# Patient Record
Sex: Male | Born: 1970
Health system: Southern US, Community
[De-identification: ages and names within clinical notes are randomized; demographics above are authoritative.]

## PROBLEM LIST (undated history)

## (undated) DIAGNOSIS — R569 Unspecified convulsions: Secondary | ICD-10-CM

## (undated) DIAGNOSIS — J45909 Unspecified asthma, uncomplicated: Secondary | ICD-10-CM

## (undated) DIAGNOSIS — I2510 Atherosclerotic heart disease of native coronary artery without angina pectoris: Secondary | ICD-10-CM

## (undated) DIAGNOSIS — I619 Nontraumatic intracerebral hemorrhage, unspecified: Secondary | ICD-10-CM

## (undated) DIAGNOSIS — K219 Gastro-esophageal reflux disease without esophagitis: Secondary | ICD-10-CM

## (undated) DIAGNOSIS — I509 Heart failure, unspecified: Secondary | ICD-10-CM

## (undated) DIAGNOSIS — Z9981 Dependence on supplemental oxygen: Secondary | ICD-10-CM

## (undated) DIAGNOSIS — I639 Cerebral infarction, unspecified: Secondary | ICD-10-CM

## (undated) DIAGNOSIS — I10 Essential (primary) hypertension: Secondary | ICD-10-CM

## (undated) HISTORY — PX: LEG SURGERY: SHX1003

---

## 1998-03-13 ENCOUNTER — Emergency Department (HOSPITAL_COMMUNITY): Admission: EM | Admit: 1998-03-13 | Discharge: 1998-03-13 | Payer: Self-pay | Admitting: Emergency Medicine

## 1998-06-12 ENCOUNTER — Emergency Department (HOSPITAL_COMMUNITY): Admission: EM | Admit: 1998-06-12 | Discharge: 1998-06-12 | Payer: Self-pay | Admitting: Emergency Medicine

## 1998-12-29 ENCOUNTER — Emergency Department (HOSPITAL_COMMUNITY): Admission: EM | Admit: 1998-12-29 | Discharge: 1998-12-29 | Payer: Self-pay | Admitting: *Deleted

## 1999-01-12 ENCOUNTER — Encounter: Payer: Self-pay | Admitting: Emergency Medicine

## 1999-01-12 ENCOUNTER — Emergency Department (HOSPITAL_COMMUNITY): Admission: EM | Admit: 1999-01-12 | Discharge: 1999-01-12 | Payer: Self-pay | Admitting: Emergency Medicine

## 1999-02-05 ENCOUNTER — Emergency Department (HOSPITAL_COMMUNITY): Admission: EM | Admit: 1999-02-05 | Discharge: 1999-02-05 | Payer: Self-pay | Admitting: Emergency Medicine

## 1999-04-03 ENCOUNTER — Inpatient Hospital Stay (HOSPITAL_COMMUNITY): Admission: EM | Admit: 1999-04-03 | Discharge: 1999-04-06 | Payer: Self-pay | Admitting: Emergency Medicine

## 1999-04-03 ENCOUNTER — Encounter: Payer: Self-pay | Admitting: Internal Medicine

## 1999-04-06 ENCOUNTER — Encounter: Payer: Self-pay | Admitting: Internal Medicine

## 1999-11-26 ENCOUNTER — Emergency Department (HOSPITAL_COMMUNITY): Admission: EM | Admit: 1999-11-26 | Discharge: 1999-11-26 | Payer: Self-pay | Admitting: Emergency Medicine

## 1999-11-28 ENCOUNTER — Emergency Department (HOSPITAL_COMMUNITY): Admission: EM | Admit: 1999-11-28 | Discharge: 1999-11-28 | Payer: Self-pay | Admitting: Emergency Medicine

## 1999-11-30 ENCOUNTER — Emergency Department (HOSPITAL_COMMUNITY): Admission: EM | Admit: 1999-11-30 | Discharge: 1999-11-30 | Payer: Self-pay | Admitting: Emergency Medicine

## 1999-11-30 ENCOUNTER — Encounter: Payer: Self-pay | Admitting: Emergency Medicine

## 1999-12-06 ENCOUNTER — Emergency Department (HOSPITAL_COMMUNITY): Admission: EM | Admit: 1999-12-06 | Discharge: 1999-12-06 | Payer: Self-pay | Admitting: Emergency Medicine

## 2000-04-21 ENCOUNTER — Emergency Department (HOSPITAL_COMMUNITY): Admission: EM | Admit: 2000-04-21 | Discharge: 2000-04-21 | Payer: Self-pay | Admitting: *Deleted

## 2000-06-12 ENCOUNTER — Emergency Department (HOSPITAL_COMMUNITY): Admission: EM | Admit: 2000-06-12 | Discharge: 2000-06-12 | Payer: Self-pay | Admitting: Emergency Medicine

## 2000-09-12 ENCOUNTER — Encounter: Payer: Self-pay | Admitting: Emergency Medicine

## 2000-09-12 ENCOUNTER — Emergency Department (HOSPITAL_COMMUNITY): Admission: EM | Admit: 2000-09-12 | Discharge: 2000-09-12 | Payer: Self-pay | Admitting: Emergency Medicine

## 2000-09-17 ENCOUNTER — Emergency Department (HOSPITAL_COMMUNITY): Admission: EM | Admit: 2000-09-17 | Discharge: 2000-09-17 | Payer: Self-pay | Admitting: Emergency Medicine

## 2000-11-13 ENCOUNTER — Encounter: Payer: Self-pay | Admitting: Emergency Medicine

## 2000-11-13 ENCOUNTER — Emergency Department (HOSPITAL_COMMUNITY): Admission: EM | Admit: 2000-11-13 | Discharge: 2000-11-13 | Payer: Self-pay | Admitting: Emergency Medicine

## 2000-12-15 ENCOUNTER — Emergency Department (HOSPITAL_COMMUNITY): Admission: EM | Admit: 2000-12-15 | Discharge: 2000-12-15 | Payer: Self-pay | Admitting: Emergency Medicine

## 2001-01-13 ENCOUNTER — Emergency Department (HOSPITAL_COMMUNITY): Admission: EM | Admit: 2001-01-13 | Discharge: 2001-01-13 | Payer: Self-pay | Admitting: Emergency Medicine

## 2001-01-22 ENCOUNTER — Emergency Department (HOSPITAL_COMMUNITY): Admission: EM | Admit: 2001-01-22 | Discharge: 2001-01-22 | Payer: Self-pay | Admitting: Emergency Medicine

## 2001-01-26 ENCOUNTER — Emergency Department (HOSPITAL_COMMUNITY): Admission: EM | Admit: 2001-01-26 | Discharge: 2001-01-26 | Payer: Self-pay | Admitting: Emergency Medicine

## 2001-01-27 ENCOUNTER — Emergency Department (HOSPITAL_COMMUNITY): Admission: EM | Admit: 2001-01-27 | Discharge: 2001-01-27 | Payer: Self-pay | Admitting: Emergency Medicine

## 2001-02-01 ENCOUNTER — Emergency Department (HOSPITAL_COMMUNITY): Admission: EM | Admit: 2001-02-01 | Discharge: 2001-02-01 | Payer: Self-pay | Admitting: Emergency Medicine

## 2001-04-09 ENCOUNTER — Emergency Department (HOSPITAL_COMMUNITY): Admission: EM | Admit: 2001-04-09 | Discharge: 2001-04-09 | Payer: Self-pay | Admitting: Emergency Medicine

## 2001-06-16 ENCOUNTER — Emergency Department (HOSPITAL_COMMUNITY): Admission: EM | Admit: 2001-06-16 | Discharge: 2001-06-16 | Payer: Self-pay | Admitting: Emergency Medicine

## 2001-08-11 ENCOUNTER — Emergency Department (HOSPITAL_COMMUNITY): Admission: EM | Admit: 2001-08-11 | Discharge: 2001-08-11 | Payer: Self-pay

## 2001-12-19 ENCOUNTER — Encounter: Payer: Self-pay | Admitting: Emergency Medicine

## 2001-12-19 ENCOUNTER — Emergency Department (HOSPITAL_COMMUNITY): Admission: EM | Admit: 2001-12-19 | Discharge: 2001-12-19 | Payer: Self-pay | Admitting: Emergency Medicine

## 2001-12-26 ENCOUNTER — Emergency Department (HOSPITAL_COMMUNITY): Admission: EM | Admit: 2001-12-26 | Discharge: 2001-12-26 | Payer: Self-pay | Admitting: Emergency Medicine

## 2001-12-26 ENCOUNTER — Encounter: Payer: Self-pay | Admitting: Emergency Medicine

## 2001-12-28 ENCOUNTER — Encounter: Payer: Self-pay | Admitting: *Deleted

## 2001-12-28 ENCOUNTER — Emergency Department (HOSPITAL_COMMUNITY): Admission: EM | Admit: 2001-12-28 | Discharge: 2001-12-28 | Payer: Self-pay | Admitting: *Deleted

## 2002-01-09 ENCOUNTER — Emergency Department (HOSPITAL_COMMUNITY): Admission: EM | Admit: 2002-01-09 | Discharge: 2002-01-09 | Payer: Self-pay | Admitting: *Deleted

## 2002-03-27 ENCOUNTER — Encounter: Payer: Self-pay | Admitting: Emergency Medicine

## 2002-03-27 ENCOUNTER — Inpatient Hospital Stay (HOSPITAL_COMMUNITY): Admission: EM | Admit: 2002-03-27 | Discharge: 2002-03-28 | Payer: Self-pay | Admitting: *Deleted

## 2002-03-28 ENCOUNTER — Encounter: Payer: Self-pay | Admitting: Internal Medicine

## 2002-06-16 ENCOUNTER — Inpatient Hospital Stay (HOSPITAL_COMMUNITY): Admission: EM | Admit: 2002-06-16 | Discharge: 2002-06-20 | Payer: Self-pay | Admitting: *Deleted

## 2002-07-27 ENCOUNTER — Encounter: Payer: Self-pay | Admitting: Emergency Medicine

## 2002-07-27 ENCOUNTER — Emergency Department (HOSPITAL_COMMUNITY): Admission: EM | Admit: 2002-07-27 | Discharge: 2002-07-28 | Payer: Self-pay | Admitting: Emergency Medicine

## 2002-08-06 ENCOUNTER — Emergency Department (HOSPITAL_COMMUNITY): Admission: EM | Admit: 2002-08-06 | Discharge: 2002-08-06 | Payer: Self-pay

## 2002-10-16 ENCOUNTER — Emergency Department (HOSPITAL_COMMUNITY): Admission: EM | Admit: 2002-10-16 | Discharge: 2002-10-16 | Payer: Self-pay | Admitting: Emergency Medicine

## 2002-11-01 ENCOUNTER — Inpatient Hospital Stay (HOSPITAL_COMMUNITY): Admission: EM | Admit: 2002-11-01 | Discharge: 2002-11-02 | Payer: Self-pay | Admitting: Emergency Medicine

## 2002-11-01 ENCOUNTER — Encounter: Payer: Self-pay | Admitting: Neurology

## 2002-12-24 ENCOUNTER — Encounter: Payer: Self-pay | Admitting: Emergency Medicine

## 2002-12-24 ENCOUNTER — Inpatient Hospital Stay (HOSPITAL_COMMUNITY): Admission: EM | Admit: 2002-12-24 | Discharge: 2002-12-27 | Payer: Self-pay | Admitting: Emergency Medicine

## 2002-12-24 ENCOUNTER — Encounter: Payer: Self-pay | Admitting: Internal Medicine

## 2002-12-25 ENCOUNTER — Encounter: Payer: Self-pay | Admitting: Pulmonary Disease

## 2002-12-27 ENCOUNTER — Emergency Department (HOSPITAL_COMMUNITY): Admission: EM | Admit: 2002-12-27 | Discharge: 2002-12-27 | Payer: Self-pay | Admitting: Emergency Medicine

## 2003-03-15 ENCOUNTER — Emergency Department (HOSPITAL_COMMUNITY): Admission: EM | Admit: 2003-03-15 | Discharge: 2003-03-16 | Payer: Self-pay | Admitting: Emergency Medicine

## 2003-04-04 ENCOUNTER — Encounter: Payer: Self-pay | Admitting: *Deleted

## 2003-04-04 ENCOUNTER — Emergency Department (HOSPITAL_COMMUNITY): Admission: AD | Admit: 2003-04-04 | Discharge: 2003-04-04 | Payer: Self-pay | Admitting: Emergency Medicine

## 2003-04-12 ENCOUNTER — Emergency Department (HOSPITAL_COMMUNITY): Admission: EM | Admit: 2003-04-12 | Discharge: 2003-04-12 | Payer: Self-pay | Admitting: Emergency Medicine

## 2003-04-14 ENCOUNTER — Emergency Department (HOSPITAL_COMMUNITY): Admission: EM | Admit: 2003-04-14 | Discharge: 2003-04-14 | Payer: Self-pay | Admitting: Emergency Medicine

## 2003-04-19 ENCOUNTER — Emergency Department (HOSPITAL_COMMUNITY): Admission: EM | Admit: 2003-04-19 | Discharge: 2003-04-19 | Payer: Self-pay | Admitting: Emergency Medicine

## 2003-04-22 ENCOUNTER — Emergency Department (HOSPITAL_COMMUNITY): Admission: EM | Admit: 2003-04-22 | Discharge: 2003-04-22 | Payer: Self-pay | Admitting: Emergency Medicine

## 2003-04-28 ENCOUNTER — Emergency Department (HOSPITAL_COMMUNITY): Admission: EM | Admit: 2003-04-28 | Discharge: 2003-04-28 | Payer: Self-pay | Admitting: Emergency Medicine

## 2003-04-29 ENCOUNTER — Emergency Department (HOSPITAL_COMMUNITY): Admission: EM | Admit: 2003-04-29 | Discharge: 2003-04-29 | Payer: Self-pay | Admitting: Emergency Medicine

## 2003-05-10 ENCOUNTER — Emergency Department (HOSPITAL_COMMUNITY): Admission: EM | Admit: 2003-05-10 | Discharge: 2003-05-10 | Payer: Self-pay | Admitting: Emergency Medicine

## 2003-05-21 ENCOUNTER — Inpatient Hospital Stay (HOSPITAL_COMMUNITY): Admission: EM | Admit: 2003-05-21 | Discharge: 2003-05-22 | Payer: Self-pay

## 2003-05-22 ENCOUNTER — Encounter: Payer: Self-pay | Admitting: Internal Medicine

## 2003-06-22 ENCOUNTER — Emergency Department (HOSPITAL_COMMUNITY): Admission: EM | Admit: 2003-06-22 | Discharge: 2003-06-23 | Payer: Self-pay | Admitting: Emergency Medicine

## 2003-09-15 ENCOUNTER — Emergency Department (HOSPITAL_COMMUNITY): Admission: AD | Admit: 2003-09-15 | Discharge: 2003-09-15 | Payer: Self-pay | Admitting: Emergency Medicine

## 2003-11-07 ENCOUNTER — Emergency Department (HOSPITAL_COMMUNITY): Admission: EM | Admit: 2003-11-07 | Discharge: 2003-11-07 | Payer: Self-pay | Admitting: Emergency Medicine

## 2003-11-09 ENCOUNTER — Emergency Department (HOSPITAL_COMMUNITY): Admission: EM | Admit: 2003-11-09 | Discharge: 2003-11-10 | Payer: Self-pay | Admitting: Emergency Medicine

## 2003-11-15 ENCOUNTER — Emergency Department (HOSPITAL_COMMUNITY): Admission: EM | Admit: 2003-11-15 | Discharge: 2003-11-15 | Payer: Self-pay | Admitting: Emergency Medicine

## 2003-11-25 ENCOUNTER — Emergency Department (HOSPITAL_COMMUNITY): Admission: EM | Admit: 2003-11-25 | Discharge: 2003-11-25 | Payer: Self-pay | Admitting: Emergency Medicine

## 2003-11-30 ENCOUNTER — Emergency Department (HOSPITAL_COMMUNITY): Admission: EM | Admit: 2003-11-30 | Discharge: 2003-11-30 | Payer: Self-pay | Admitting: Emergency Medicine

## 2004-03-07 ENCOUNTER — Emergency Department (HOSPITAL_COMMUNITY): Admission: EM | Admit: 2004-03-07 | Discharge: 2004-03-07 | Payer: Self-pay | Admitting: Emergency Medicine

## 2004-04-08 ENCOUNTER — Emergency Department (HOSPITAL_COMMUNITY): Admission: EM | Admit: 2004-04-08 | Discharge: 2004-04-08 | Payer: Self-pay | Admitting: Emergency Medicine

## 2004-05-11 ENCOUNTER — Emergency Department (HOSPITAL_COMMUNITY): Admission: EM | Admit: 2004-05-11 | Discharge: 2004-05-11 | Payer: Self-pay | Admitting: Emergency Medicine

## 2004-08-29 ENCOUNTER — Emergency Department (HOSPITAL_COMMUNITY): Admission: EM | Admit: 2004-08-29 | Discharge: 2004-08-29 | Payer: Self-pay | Admitting: Emergency Medicine

## 2004-09-06 ENCOUNTER — Emergency Department (HOSPITAL_COMMUNITY): Admission: EM | Admit: 2004-09-06 | Discharge: 2004-09-06 | Payer: Self-pay

## 2004-09-20 ENCOUNTER — Emergency Department (HOSPITAL_COMMUNITY): Admission: EM | Admit: 2004-09-20 | Discharge: 2004-09-20 | Payer: Self-pay | Admitting: Emergency Medicine

## 2004-12-12 ENCOUNTER — Emergency Department (HOSPITAL_COMMUNITY): Admission: EM | Admit: 2004-12-12 | Discharge: 2004-12-13 | Payer: Self-pay | Admitting: Emergency Medicine

## 2005-03-13 ENCOUNTER — Emergency Department (HOSPITAL_COMMUNITY): Admission: EM | Admit: 2005-03-13 | Discharge: 2005-03-13 | Payer: Self-pay | Admitting: Emergency Medicine

## 2005-05-17 ENCOUNTER — Emergency Department (HOSPITAL_COMMUNITY): Admission: EM | Admit: 2005-05-17 | Discharge: 2005-05-17 | Payer: Self-pay | Admitting: Emergency Medicine

## 2006-01-15 ENCOUNTER — Emergency Department (HOSPITAL_COMMUNITY): Admission: EM | Admit: 2006-01-15 | Discharge: 2006-01-15 | Payer: Self-pay | Admitting: Emergency Medicine

## 2006-06-03 ENCOUNTER — Emergency Department (HOSPITAL_COMMUNITY): Admission: EM | Admit: 2006-06-03 | Discharge: 2006-06-03 | Payer: Self-pay | Admitting: Emergency Medicine

## 2006-06-11 ENCOUNTER — Emergency Department (HOSPITAL_COMMUNITY): Admission: EM | Admit: 2006-06-11 | Discharge: 2006-06-11 | Payer: Self-pay | Admitting: Emergency Medicine

## 2006-06-15 ENCOUNTER — Emergency Department (HOSPITAL_COMMUNITY): Admission: EM | Admit: 2006-06-15 | Discharge: 2006-06-15 | Payer: Self-pay | Admitting: Emergency Medicine

## 2007-07-07 ENCOUNTER — Emergency Department (HOSPITAL_COMMUNITY): Admission: EM | Admit: 2007-07-07 | Discharge: 2007-07-07 | Payer: Self-pay | Admitting: Emergency Medicine

## 2007-07-21 ENCOUNTER — Emergency Department (HOSPITAL_COMMUNITY): Admission: EM | Admit: 2007-07-21 | Discharge: 2007-07-22 | Payer: Self-pay | Admitting: Emergency Medicine

## 2007-10-29 ENCOUNTER — Emergency Department (HOSPITAL_COMMUNITY): Admission: EM | Admit: 2007-10-29 | Discharge: 2007-10-30 | Payer: Self-pay | Admitting: Emergency Medicine

## 2007-10-30 LAB — CONVERTED CEMR LAB
BUN: 8 mg/dL
Basophils Absolute: 0 10*3/uL
Basophils Relative: 0 %
CO2: 26 meq/L
Chloride: 106 meq/L
Creatinine, Ser: 1.06 mg/dL
Glucose, Bld: 109 mg/dL
HCT: 48.5 %
Hemoglobin: 16.6 g/dL
Lymphocytes Relative: 11 %
Lymphs Abs: 1.1 10*3/uL
Potassium: 4 meq/L
Total Bilirubin: 0.8 mg/dL

## 2007-11-10 ENCOUNTER — Emergency Department (HOSPITAL_COMMUNITY): Admission: EM | Admit: 2007-11-10 | Discharge: 2007-11-10 | Payer: Self-pay | Admitting: Emergency Medicine

## 2007-11-12 ENCOUNTER — Emergency Department (HOSPITAL_COMMUNITY): Admission: EM | Admit: 2007-11-12 | Discharge: 2007-11-12 | Payer: Self-pay | Admitting: Emergency Medicine

## 2007-11-13 ENCOUNTER — Emergency Department (HOSPITAL_COMMUNITY): Admission: EM | Admit: 2007-11-13 | Discharge: 2007-11-14 | Payer: Self-pay | Admitting: Emergency Medicine

## 2007-11-13 ENCOUNTER — Emergency Department (HOSPITAL_COMMUNITY): Admission: EM | Admit: 2007-11-13 | Discharge: 2007-11-13 | Payer: Self-pay | Admitting: Emergency Medicine

## 2007-11-14 ENCOUNTER — Emergency Department (HOSPITAL_COMMUNITY): Admission: EM | Admit: 2007-11-14 | Discharge: 2007-11-14 | Payer: Self-pay | Admitting: Emergency Medicine

## 2007-11-20 ENCOUNTER — Emergency Department (HOSPITAL_COMMUNITY): Admission: EM | Admit: 2007-11-20 | Discharge: 2007-11-20 | Payer: Self-pay | Admitting: Emergency Medicine

## 2007-11-24 ENCOUNTER — Ambulatory Visit: Payer: Self-pay | Admitting: Nurse Practitioner

## 2007-11-24 DIAGNOSIS — I10 Essential (primary) hypertension: Secondary | ICD-10-CM | POA: Insufficient documentation

## 2007-11-24 DIAGNOSIS — R569 Unspecified convulsions: Secondary | ICD-10-CM | POA: Insufficient documentation

## 2007-11-24 DIAGNOSIS — J321 Chronic frontal sinusitis: Secondary | ICD-10-CM | POA: Insufficient documentation

## 2007-11-24 DIAGNOSIS — R519 Headache, unspecified: Secondary | ICD-10-CM | POA: Insufficient documentation

## 2007-11-24 DIAGNOSIS — R51 Headache: Secondary | ICD-10-CM | POA: Insufficient documentation

## 2007-11-24 LAB — CONVERTED CEMR LAB
ALT: 9 units/L (ref 0–53)
BUN: 15 mg/dL (ref 6–23)
Basophils Absolute: 0 10*3/uL (ref 0.0–0.1)
Basophils Relative: 0 % (ref 0–1)
CO2: 26 meq/L (ref 19–32)
Chloride: 98 meq/L (ref 96–112)
Glucose, Bld: 72 mg/dL (ref 70–99)
MCHC: 33.6 g/dL (ref 30.0–36.0)
MCV: 95.9 fL (ref 78.0–100.0)
Monocytes Absolute: 0.8 10*3/uL (ref 0.1–1.0)
Neutro Abs: 5.6 10*3/uL (ref 1.7–7.7)
Neutrophils Relative %: 66 % (ref 43–77)
Platelets: 313 10*3/uL (ref 150–400)
Potassium: 4.3 meq/L (ref 3.5–5.3)
RDW: 13.9 % (ref 11.5–15.5)
Sodium: 139 meq/L (ref 135–145)

## 2007-12-06 ENCOUNTER — Telehealth (INDEPENDENT_AMBULATORY_CARE_PROVIDER_SITE_OTHER): Payer: Self-pay | Admitting: *Deleted

## 2007-12-16 ENCOUNTER — Emergency Department (HOSPITAL_COMMUNITY): Admission: EM | Admit: 2007-12-16 | Discharge: 2007-12-17 | Payer: Self-pay | Admitting: Emergency Medicine

## 2008-01-11 ENCOUNTER — Ambulatory Visit: Payer: Self-pay | Admitting: Nurse Practitioner

## 2008-01-11 DIAGNOSIS — F17200 Nicotine dependence, unspecified, uncomplicated: Secondary | ICD-10-CM | POA: Insufficient documentation

## 2008-01-13 ENCOUNTER — Emergency Department (HOSPITAL_COMMUNITY): Admission: EM | Admit: 2008-01-13 | Discharge: 2008-01-13 | Payer: Self-pay | Admitting: Emergency Medicine

## 2008-01-19 ENCOUNTER — Encounter (INDEPENDENT_AMBULATORY_CARE_PROVIDER_SITE_OTHER): Payer: Self-pay | Admitting: Nurse Practitioner

## 2008-03-16 ENCOUNTER — Emergency Department (HOSPITAL_COMMUNITY): Admission: EM | Admit: 2008-03-16 | Discharge: 2008-03-16 | Payer: Self-pay | Admitting: Emergency Medicine

## 2008-06-15 ENCOUNTER — Emergency Department (HOSPITAL_COMMUNITY): Admission: EM | Admit: 2008-06-15 | Discharge: 2008-06-16 | Payer: Self-pay | Admitting: *Deleted

## 2008-07-17 ENCOUNTER — Emergency Department (HOSPITAL_COMMUNITY): Admission: EM | Admit: 2008-07-17 | Discharge: 2008-07-17 | Payer: Self-pay | Admitting: Emergency Medicine

## 2008-07-28 ENCOUNTER — Emergency Department (HOSPITAL_COMMUNITY): Admission: EM | Admit: 2008-07-28 | Discharge: 2008-07-29 | Payer: Self-pay | Admitting: Emergency Medicine

## 2008-11-14 ENCOUNTER — Emergency Department (HOSPITAL_COMMUNITY): Admission: EM | Admit: 2008-11-14 | Discharge: 2008-11-14 | Payer: Self-pay | Admitting: Emergency Medicine

## 2009-03-02 ENCOUNTER — Emergency Department (HOSPITAL_COMMUNITY): Admission: EM | Admit: 2009-03-02 | Discharge: 2009-03-03 | Payer: Self-pay | Admitting: Emergency Medicine

## 2009-03-03 ENCOUNTER — Emergency Department (HOSPITAL_COMMUNITY): Admission: EM | Admit: 2009-03-03 | Discharge: 2009-03-03 | Payer: Self-pay | Admitting: Emergency Medicine

## 2009-03-13 ENCOUNTER — Emergency Department (HOSPITAL_COMMUNITY): Admission: EM | Admit: 2009-03-13 | Discharge: 2009-03-13 | Payer: Self-pay | Admitting: Emergency Medicine

## 2009-03-14 ENCOUNTER — Emergency Department (HOSPITAL_COMMUNITY): Admission: EM | Admit: 2009-03-14 | Discharge: 2009-03-14 | Payer: Self-pay | Admitting: Emergency Medicine

## 2009-03-15 ENCOUNTER — Emergency Department (HOSPITAL_COMMUNITY): Admission: EM | Admit: 2009-03-15 | Discharge: 2009-03-15 | Payer: Self-pay | Admitting: Emergency Medicine

## 2009-05-29 ENCOUNTER — Emergency Department (HOSPITAL_COMMUNITY): Admission: EM | Admit: 2009-05-29 | Discharge: 2009-05-30 | Payer: Self-pay | Admitting: Emergency Medicine

## 2009-06-19 ENCOUNTER — Emergency Department (HOSPITAL_COMMUNITY): Admission: EM | Admit: 2009-06-19 | Discharge: 2009-06-19 | Payer: Self-pay | Admitting: Emergency Medicine

## 2009-11-20 ENCOUNTER — Emergency Department (HOSPITAL_COMMUNITY): Admission: EM | Admit: 2009-11-20 | Discharge: 2009-11-20 | Payer: Self-pay | Admitting: Emergency Medicine

## 2010-02-14 ENCOUNTER — Emergency Department (HOSPITAL_COMMUNITY): Admission: EM | Admit: 2010-02-14 | Discharge: 2010-02-14 | Payer: Self-pay | Admitting: Emergency Medicine

## 2010-09-15 ENCOUNTER — Emergency Department (HOSPITAL_COMMUNITY): Admission: EM | Admit: 2010-09-15 | Discharge: 2010-09-15 | Payer: Self-pay | Admitting: Emergency Medicine

## 2010-11-20 ENCOUNTER — Emergency Department (HOSPITAL_COMMUNITY): Admission: EM | Admit: 2010-11-20 | Discharge: 2010-06-15 | Payer: Self-pay | Admitting: Emergency Medicine

## 2010-11-21 ENCOUNTER — Telehealth (INDEPENDENT_AMBULATORY_CARE_PROVIDER_SITE_OTHER): Payer: Self-pay | Admitting: Nurse Practitioner

## 2010-12-03 ENCOUNTER — Emergency Department (HOSPITAL_COMMUNITY)
Admission: EM | Admit: 2010-12-03 | Discharge: 2010-12-03 | Payer: Self-pay | Source: Home / Self Care | Admitting: Emergency Medicine

## 2010-12-11 ENCOUNTER — Emergency Department (HOSPITAL_COMMUNITY)
Admission: EM | Admit: 2010-12-11 | Discharge: 2010-12-11 | Payer: Self-pay | Source: Home / Self Care | Admitting: Emergency Medicine

## 2010-12-16 ENCOUNTER — Emergency Department (HOSPITAL_COMMUNITY)
Admission: EM | Admit: 2010-12-16 | Discharge: 2010-12-16 | Payer: Self-pay | Source: Home / Self Care | Admitting: Emergency Medicine

## 2010-12-17 ENCOUNTER — Telehealth (INDEPENDENT_AMBULATORY_CARE_PROVIDER_SITE_OTHER): Payer: Self-pay | Admitting: Nurse Practitioner

## 2010-12-17 ENCOUNTER — Emergency Department (HOSPITAL_COMMUNITY)
Admission: EM | Admit: 2010-12-17 | Discharge: 2010-12-17 | Payer: Self-pay | Source: Home / Self Care | Admitting: Emergency Medicine

## 2010-12-18 ENCOUNTER — Emergency Department (HOSPITAL_COMMUNITY)
Admission: EM | Admit: 2010-12-18 | Discharge: 2010-12-18 | Payer: Self-pay | Source: Home / Self Care | Admitting: Emergency Medicine

## 2010-12-19 ENCOUNTER — Emergency Department (HOSPITAL_COMMUNITY)
Admission: EM | Admit: 2010-12-19 | Discharge: 2010-12-19 | Payer: Self-pay | Source: Home / Self Care | Admitting: Emergency Medicine

## 2010-12-22 ENCOUNTER — Ambulatory Visit: Admit: 2010-12-22 | Payer: Self-pay | Admitting: Nurse Practitioner

## 2010-12-24 ENCOUNTER — Encounter (INDEPENDENT_AMBULATORY_CARE_PROVIDER_SITE_OTHER): Payer: Self-pay | Admitting: Nurse Practitioner

## 2010-12-25 ENCOUNTER — Emergency Department (HOSPITAL_COMMUNITY)
Admission: EM | Admit: 2010-12-25 | Discharge: 2010-12-25 | Payer: Self-pay | Source: Home / Self Care | Admitting: Emergency Medicine

## 2010-12-26 ENCOUNTER — Emergency Department (HOSPITAL_COMMUNITY)
Admission: EM | Admit: 2010-12-26 | Discharge: 2010-12-26 | Payer: Self-pay | Source: Home / Self Care | Admitting: Emergency Medicine

## 2010-12-28 ENCOUNTER — Emergency Department (HOSPITAL_COMMUNITY)
Admission: EM | Admit: 2010-12-28 | Discharge: 2010-12-28 | Payer: Self-pay | Source: Home / Self Care | Admitting: Emergency Medicine

## 2011-01-06 ENCOUNTER — Emergency Department (HOSPITAL_COMMUNITY)
Admission: EM | Admit: 2011-01-06 | Discharge: 2011-01-06 | Payer: Self-pay | Source: Home / Self Care | Admitting: Emergency Medicine

## 2011-01-06 ENCOUNTER — Emergency Department (HOSPITAL_COMMUNITY)
Admission: EM | Admit: 2011-01-06 | Discharge: 2011-01-07 | Payer: Self-pay | Source: Home / Self Care | Admitting: Emergency Medicine

## 2011-01-10 ENCOUNTER — Emergency Department (HOSPITAL_COMMUNITY)
Admission: EM | Admit: 2011-01-10 | Discharge: 2011-01-10 | Payer: Self-pay | Source: Home / Self Care | Admitting: Emergency Medicine
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
## 2011-01-15 NOTE — Progress Notes (Signed)
Summary: IN ER FOR HEADACHE  Phone Note Other Incoming   Caller: [place] [facility] Summary of Call: mr lebarron is in er for headache and is requesting that they use oxygen for his headache and Selena Batten says that he has chosen [place], becasue they are sending him hoome with home oxygen and she had to call to get the name of his pcp so they can put it on the home health form Initial call taken by: Leodis Rains,  December 17, 2010 12:19 PM  Follow-up for Phone Call        Need ER visit to see why pt was prescribed oxygen Follow-up by: Lehman Prom FNP,  December 18, 2010 9:57 AM  Additional Follow-up for Phone Call Additional follow up Details #1::        ER records on your cubby desk.  Dutch Quint RN  December 18, 2010 2:37 PM     Additional Follow-up for Phone Call Additional follow up Details #2::    ER notes reviewed see that pt had cluster headache and was started on oxygen in the hospital which does improve this type headache but to my knowledge that is not an indication to get home o2. he should instead try to find out what is triggering his headaches in an effort to decrease occurences. Follow-up by: Lehman Prom FNP,  December 23, 2010 8:08 AM  Additional Follow-up for Phone Call Additional follow up Details #3:: Details for Additional Follow-up Action Taken: Unable to leave message - no answer.  Dutch Quint RN  December 23, 2010 9:45 AM  No answer, unable to leave message. Gaylyn Cheers RN  December 23, 2010 4:08 PM  No answer, unable to leave message.  Letter sent.  Dutch Quint RN  December 24, 2010 9:58 AM

## 2011-01-15 NOTE — Letter (Signed)
Summary: Generic Letter  [facility]  [address] [place], [place] [address]   Phone: [phone]  Fax: [phone]        12/24/2010  Richard [name] [address] [place], [place]  [address]  Dear [name],  We have been unable to contact you by telephone.  Please call our office, at your earliest convenience, so that we may speak with you.   Sincerely,   Dutch Quint RN

## 2011-01-16 ENCOUNTER — Encounter (INDEPENDENT_AMBULATORY_CARE_PROVIDER_SITE_OTHER): Payer: Self-pay | Admitting: Nurse Practitioner

## 2011-01-16 ENCOUNTER — Encounter: Payer: Self-pay | Admitting: Nurse Practitioner

## 2011-01-16 ENCOUNTER — Telehealth (INDEPENDENT_AMBULATORY_CARE_PROVIDER_SITE_OTHER): Payer: Self-pay | Admitting: Nurse Practitioner

## 2011-01-16 LAB — CONVERTED CEMR LAB
ALT: 10 units/L (ref 0–53)
AST: 12 units/L (ref 0–37)
Albumin: 4.3 g/dL (ref 3.5–5.2)
Alkaline Phosphatase: 67 units/L (ref 39–117)
BUN: 16 mg/dL (ref 6–23)
Basophils Absolute: 0 10*3/uL (ref 0.0–0.1)
Basophils Relative: 0 % (ref 0–1)
Bilirubin Urine: NEGATIVE
Blood in Urine, dipstick: NEGATIVE
CO2: 20 meq/L (ref 19–32)
Calcium: 8.9 mg/dL (ref 8.4–10.5)
Chloride: 109 meq/L (ref 96–112)
Creatinine, Ser: 0.99 mg/dL (ref 0.40–1.50)
Glucose, Urine, Semiquant: NEGATIVE
Hemoglobin: 15.2 g/dL (ref 13.0–17.0)
Lymphocytes Relative: 22 % (ref 12–46)
MCV: 90.3 fL (ref 78.0–100.0)
Neutro Abs: 5.4 10*3/uL (ref 1.7–7.7)
Neutrophils Relative %: 62 % (ref 43–77)
RDW: 15 % (ref 11.5–15.5)
pH: 5

## 2011-01-19 ENCOUNTER — Encounter (INDEPENDENT_AMBULATORY_CARE_PROVIDER_SITE_OTHER): Payer: Self-pay | Admitting: Nurse Practitioner

## 2011-01-21 NOTE — Assessment & Plan Note (Signed)
Summary: HTN/Headaches   Vital Signs:  Patient profile:   40 year old male Weight:      214.9 pounds BMI:     30.95 Temp:     97.7 degrees F oral Pulse rate:   80 / minute Pulse rhythm:   regular Resp:     20 per minute BP sitting:   170 / 120  (left arm) Cuff size:   regular  Vitals Entered By: Levon Hedger (January 16, 2011 3:30 PM)  Nutrition Counseling: Patient's BMI is greater than 25 and therefore counseled on weight management options. CC: follow-up visit HTN....headaches....leg gives out on him, Hypertension Management, Headache Is Patient Diabetic? No Pain Assessment Patient in pain? yes     Location: head Intensity: 5  Does patient need assistance? Functional Status Self care Ambulation Normal   CC:  follow-up visit HTN....headaches....leg gives out on him, Hypertension Management, and Headache.  History of Present Illness:  Pt into the office for f/u on htn. Last visit 2009  Pt did not bring his medications with him to the office. He is actually unsure of what his medications are for blood pressure.  Headache HPI:      He has approximately 5+ headaches per month.  Headaches have been occurring since age 1.        The headaches are not associated with an aura.  The location of the headaches are bilateral-starts left.  Headache quality is sharp (knife-like).        Positive alarm features include new onset H/A's in middle-age or later and seizures.        Additional history: last seizure was many years ago.    Hypertension History:      He complains of headache, but denies chest pain and palpitations.  He notes no problems with any antihypertensive medication side effects.  Pt has been to the ER multiple times for high blood pressure.  Further comments include: Pt has a hx of high blood pressure which dates back to his last visit here but pt reports he did not know the dx. .        Positive major cardiovascular risk factors include hypertension and  current tobacco user.  Negative major cardiovascular risk factors include male age less than 64 years old, no history of diabetes or hyperlipidemia, and negative family history for ischemic heart disease.      Habits & Providers  Alcohol-Tobacco-Diet     Alcohol drinks/day: 0     Tobacco Status: current     Tobacco Counseling: to quit use of tobacco products     Cigarette Packs/Day: 1  Exercise-Depression-Behavior     Does Patient Exercise: yes     Exercise Counseling: to improve exercise regimen     Type of exercise: walk     Have you felt down or hopeless? no     Have you felt little pleasure in things? no     Seat Belt Use: 75  Allergies (verified): No Known Drug Allergies  Review of Systems CV:  Denies chest pain or discomfort. Resp:  Denies cough. GI:  Denies abdominal pain, nausea, and vomiting. Neuro:  Complains of seizures.  Physical Exam  General:  alert.   Head:  normocephalic.   Lungs:  normal breath sounds.   Heart:  normal rate and regular rhythm.   Abdomen:  normal bowel sounds.   Neurologic:  alert & oriented X3.   Skin:  color normal.   Psych:  Oriented X3.  Impression & Recommendations:  Problem # 1:  HYPERTENSION, BENIGN (ICD-401.1) BP is still VERY elevated labetalol given in office - advised pt to get meds as ordered The following medications were removed from the medication list:    Lisinopril-hydrochlorothiazide 20-25 Mg Tabs (Lisinopril-hydrochlorothiazide) .Marland Kitchen... 1 tablet by mouth daily for blood pressure His updated medication list for this problem includes:    Toprol Xl 50 Mg Xr24h-tab (Metoprolol succinate) ..... One tablet by mouth daily for blood pressure    Lisinopril 40 Mg Tabs (Lisinopril) ..... One tablet by mouth daily for blood pressure  Orders: T-Comprehensive Metabolic Panel [phone]) T-PSA (09811-91478) T-CBC w/Diff (29562-13086) T-TSH (57846-96295) EKG w/ Interpretation (93000) Split Night (Split Night)  Problem  # 2:  TOBACCO ABUSE (ICD-305.1) advise cessation  Problem # 3:  SEIZURE DISORDER (ICD-780.39) no seizure in many years no current meds  Problem # 4:  HEADACHE (ICD-784.0) will need to see if pt has sleep apnea which could be causing headache The following medications were removed from the medication list:    Tramadol Hcl 50 Mg Tabs (Tramadol hcl) .Marland Kitchen... Take 1to 2 tablets every 6 hours as needed for pain    Imitrex 100 Mg Tabs (Sumatriptan succinate) .Marland Kitchen... 1 tablet by mouth at onset of headache, may repeat in 2 hours if headache persists His updated medication list for this problem includes:    Toprol Xl 50 Mg Xr24h-tab (Metoprolol succinate) ..... One tablet by mouth daily for blood pressure  Complete Medication List: 1)  Toprol Xl 50 Mg Xr24h-tab (Metoprolol succinate) .... One tablet by mouth daily for blood pressure 2)  Lisinopril 40 Mg Tabs (Lisinopril) .... One tablet by mouth daily for blood pressure  Hypertension Assessment/Plan:      The patient's hypertensive risk group is category B: At least one risk factor (excluding diabetes) with no target organ damage.  Today's blood pressure is 170/120.  His blood pressure goal is < 140/90.  Patient Instructions: 1)  High Blood pressure- Your blood pressure is still very high. You have been given a blood pressure pill today in the office however you need to get your blood pressure medications filled and take as ordered.  High blood pressure over times makes you at high risk for stroke. This is what we want to prevent. 2)  Headache - may be caused by high blood pressure.  May also be caused by sleep apnea.  You will be ordered for a sleep study.  It is very important that you keep this appointment when scheduled. 3)  Follow up in 2 weeks with the triage visit.  Take your blood pressure before this visit.  Goal <140/90 Prescriptions: LISINOPRIL 40 MG TABS (LISINOPRIL) One tablet by mouth daily for blood pressure  #30 x 5   Entered and  Authorized by:   Lehman Prom FNP   Signed by:   Lehman Prom FNP on 01/16/2011   Method used:   Print then Give to Patient   RxID:   [id number] TOPROL XL 50 MG XR24H-TAB (METOPROLOL SUCCINATE) One tablet by mouth daily for blood pressure  #30 x 5   Entered and Authorized by:   Lehman Prom FNP   Signed by:   Lehman Prom FNP on 01/16/2011   Method used:   Print then Give to Patient   RxID:   [id number]    Medication Administration  Medication # 1:    Medication: ASA 325mg  tab  Medication # 3:    Medication: labetolol 200mg     Diagnosis:  HYPERTENSION, BENIGN (ICD-401.1)    Dose: 1/2  tablet    Route: po    Exp Date: 05/2012    Lot #: [id number]    Mfr: [email or web address]    Comments: ndc [phone]    Patient tolerated medication without complications    Given by: Levon Hedger (January 16, 2011 5:20 PM)  Orders Added: 1)  Est. Patient Level IV [99214] 2)  T-Comprehensive Metabolic Panel [80053-22900] 3)  T-PSA [14782-95621] 4)  T-CBC w/Diff [30865-78469] 5)  T-TSH [62952-84132] 6)  EKG w/ Interpretation [93000] 7)  Split Night [Split Night]    Laboratory Results   Urine Tests  Date/Time Received: January 16, 2011 3:51 PM   Routine Urinalysis   Glucose: negative   (Normal Range: Negative) Bilirubin: negative   (Normal Range: Negative) Ketone: trace (5)   (Normal Range: Negative) Spec. Gravity: >=1.030   (Normal Range: 1.003-1.035) Blood: negative   (Normal Range: Negative) pH: 5.0   (Normal Range: 5.0-8.0) Protein: trace   (Normal Range: Negative) Urobilinogen: 0.2   (Normal Range: 0-1) Nitrite: negative   (Normal Range: Negative) Leukocyte Esterace: negative   (Normal Range: Negative)         EKG  Procedure date:  01/16/2011  Findings:      NSR Possible left atrial enlargement LVH   Medication Administration  Medication # 1:    Medication: ASA 325mg  tab  Medication # 3:    Medication: labetolol 200mg     Diagnosis:  HYPERTENSION, BENIGN (ICD-401.1)    Dose: 1/2  tablet    Route: po    Exp Date: 05/2012    Lot #: [id number]    Mfr: [email or web address]    Comments: ndc [phone]    Patient tolerated medication without complications    Given by: Levon Hedger (January 16, 2011 5:20 PM)  Orders Added: 1)  Est. Patient Level IV [99214] 2)  T-Comprehensive Metabolic Panel [80053-22900] 3)  T-PSA [47425-95638] 4)  T-CBC w/Diff [75643-32951] 5)  T-TSH [88416-60630] 6)  EKG w/ Interpretation [93000] 7)  Split Night [Split Night]

## 2011-01-29 NOTE — Progress Notes (Signed)
Summary: Sleep Study  Phone Note Outgoing Call   Summary of Call: schedule pt for the split night sleep study call him at [phone] with time/date of the appt Initial call taken by: Lehman Prom FNP,  January 16, 2011 4:16 PM  Follow-up for Phone Call        SEND A REFERRAL TO [facility]  PH [phone] THEY WILL CONTACT THE PT TO MADE AN APPT Follow-up by: Cheryll Dessert,  January 19, 2011 12:24 PM

## 2011-01-29 NOTE — Letter (Signed)
Summary: *HSN Results Follow up  [facility]  [address] [place], [place] [address]   Phone: [phone]  Fax: [phone]      01/19/2011   Richard [name] [address] [place], [place]  [address]   Dear  [name],                            ____S.Drinkard,FNP   ____D. Gore,FNP       ____B. McPherson,MD   ____V. Rankins,MD    ____E. Mulberry,MD    _X___N. Daphine Deutscher, FNP  ____D. Reche Dixon, MD    ____K. Philipp Deputy, MD    ____Other     This letter is to inform you that your recent test(s):  _______Pap Smear    ___X____Lab Test     _______X-ray    _______ is within acceptable limits  _______ requires a medication change  _______ requires a follow-up lab visit  ___X____ requires a follow-up visit with your provider   Comments:  Labs done during recent office visit are normal.       _________________________________________________________ If you have any questions, please contact our office [phone].                    Sincerely,    Richard Prom FNP [facility]

## 2011-02-11 ENCOUNTER — Ambulatory Visit (HOSPITAL_BASED_OUTPATIENT_CLINIC_OR_DEPARTMENT_OTHER): Payer: Self-pay

## 2011-02-23 LAB — CBC
Hemoglobin: 15.6 g/dL (ref 13.0–17.0)
MCH: 31.8 pg (ref 26.0–34.0)
MCHC: 34.7 g/dL (ref 30.0–36.0)
RDW: 13.6 % (ref 11.5–15.5)

## 2011-02-23 LAB — DIFFERENTIAL
Basophils Absolute: 0 10*3/uL (ref 0.0–0.1)
Basophils Relative: 0 % (ref 0–1)
Neutro Abs: 11.1 10*3/uL — ABNORMAL HIGH (ref 1.7–7.7)
Neutrophils Relative %: 86 % — ABNORMAL HIGH (ref 43–77)

## 2011-02-23 LAB — POCT I-STAT, CHEM 8
Calcium, Ion: 1.13 mmol/L (ref 1.12–1.32)
Glucose, Bld: 129 mg/dL — ABNORMAL HIGH (ref 70–99)
Hemoglobin: 19 g/dL — ABNORMAL HIGH (ref 13.0–17.0)
TCO2: 27 mmol/L (ref 0–100)

## 2011-02-23 LAB — BASIC METABOLIC PANEL
Chloride: 109 mEq/L (ref 96–112)
Creatinine, Ser: 1.1 mg/dL (ref 0.4–1.5)
GFR calc non Af Amer: >60 mL/min (ref >60)
Potassium: 4.1 mEq/L (ref 3.5–5.1)

## 2011-02-23 LAB — URINALYSIS, ROUTINE W REFLEX MICROSCOPIC
Nitrite: NEGATIVE
Urobilinogen, UA: 0.2 mg/dL (ref 0.0–1.0)

## 2011-03-03 ENCOUNTER — Ambulatory Visit (HOSPITAL_BASED_OUTPATIENT_CLINIC_OR_DEPARTMENT_OTHER): Payer: Self-pay

## 2011-03-17 LAB — BASIC METABOLIC PANEL
GFR calc Af Amer: >60 mL/min (ref >60)
GFR calc non Af Amer: >60 mL/min (ref >60)
Potassium: 3.9 mEq/L (ref 3.5–5.1)
Sodium: 140 mEq/L (ref 135–145)

## 2011-03-17 LAB — CBC
HCT: 46.1 % (ref 39.0–52.0)
Hemoglobin: 15.4 g/dL (ref 13.0–17.0)
Platelets: 225 10*3/uL (ref 150–400)
WBC: 7.5 10*3/uL (ref 4.0–10.5)

## 2011-03-17 LAB — DIFFERENTIAL
Eosinophils Relative: 0 % (ref 0–5)
Lymphocytes Relative: 30 % (ref 12–46)
Lymphs Abs: 2.2 10*3/uL (ref 0.7–4.0)

## 2011-03-25 LAB — RAPID URINE DRUG SCREEN, HOSP PERFORMED
Amphetamines: NOT DETECTED
Barbiturates: NOT DETECTED
Benzodiazepines: NOT DETECTED

## 2011-03-26 LAB — BASIC METABOLIC PANEL
BUN: 13 mg/dL (ref 6–23)
Calcium: 8.8 mg/dL (ref 8.4–10.5)
Creatinine, Ser: 1.03 mg/dL (ref 0.4–1.5)
GFR calc non Af Amer: >60 mL/min (ref >60)
Glucose, Bld: 94 mg/dL (ref 70–99)
Sodium: 138 mEq/L (ref 135–145)

## 2011-03-26 LAB — DIFFERENTIAL
Basophils Absolute: 0 10*3/uL (ref 0.0–0.1)
Lymphocytes Relative: 14 % (ref 12–46)
Neutro Abs: 9.5 10*3/uL — ABNORMAL HIGH (ref 1.7–7.7)
Neutrophils Relative %: 78 % — ABNORMAL HIGH (ref 43–77)

## 2011-03-26 LAB — CBC
Hemoglobin: 16.6 g/dL (ref 13.0–17.0)
Platelets: 242 10*3/uL (ref 150–400)
RDW: 15.3 % (ref 11.5–15.5)

## 2011-04-16 ENCOUNTER — Ambulatory Visit (HOSPITAL_BASED_OUTPATIENT_CLINIC_OR_DEPARTMENT_OTHER): Payer: Self-pay
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
## 2011-05-01 NOTE — H&P (Signed)
Richard [name], Richard [name] NO.:  [id number]   MEDICAL RECORD NO.:  [id number]                   PATIENT TYPE:  INP   LOCATION:  1828                                 FACILITY:  [facility]   PHYSICIAN:  Casimiro Needle L. Zayon Ranger, M.D.           DATE OF BIRTH:  [birth year]   DATE OF ADMISSION:  11/01/2002  DATE OF DISCHARGE:                                HISTORY & PHYSICAL   DATE OF BIRTH:  Dec 16, 1970.   CHIEF COMPLAINT:  Seizure and altered mental status.   HISTORY:  This is one of several [facility] admissions for this 40-  year-old man with past medical history of known seizures and cocaine abuse.  There is no independent caregiver available to provide a history to me  tonight. Per reports, the patient had a generalized seizure at approximately  11:00 p.m. at home. EMS transported him to the [facility] emergency  room where he had another witnessed generalized seizure and he has had a  prolonged encephalopathic and postictal state since that time. He was given  2 mg of Ativan IM in the emergency room and neurosurgery is called for  admission and further evaluation.   PAST MEDICAL HISTORY:  Remarkable for known seizures. He apparently does  have epilepsy and is on Dilantin. He has a history of cocaine use in the  past. 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 He may have hypertension and was placed on low dose  diuretic in July.   FAMILY HISTORY:  Unknown.   SOCIAL HISTORY:  He apparently is disabled. He currently lives with  his  mother although she is not at the bedside. There is a history of tobacco and  cocaine abuse. Alcohol use is unknown.   ALLERGIES:  No known drug allergies per chart.   MEDICATIONS:  He is suppose to be on Dilantin, unknown dose, perhaps 300 mg  per day.   REVIEW OF SYSTEMS:  Unavailable due to the patient's altered mental status.   PHYSICAL EXAMINATION:   VITAL SIGNS: Temperature 99.1, blood pressure 138/78,  pulse 117, respiratory rate 20. O2 sat 93% on room air.  GENERAL: This is a healthy appearing but poorly kempt man in no evident  distress. He is poorly responsive.  HEENT: Head, cranial nerves normocephalic and atraumatic. Oropharynx benign.  NECK: Supple without carotid bruit.  HEART: Tachycardiac. Regular rate and rhythm. No murmur.  CHEST:  Clear to auscultation and percussion.  ABDOMEN: Soft and nontender. Normal active bowel sounds.  EXTREMITIES: Trace edema. Dorsalis pedis pulses are present.  NEURO: Mental status, he is obtunded. Arouses briefly to pain. Will not  follow commands or answer questions. Will occasionally attempt to speak in  intelligible speech. Cranial nerves, pupils are 5 mm and reactive. Gag  reflex present. Face symmetric. Motor examination, moves all extremities  with  good strength. Sensation, withdraws to pain in all four extremities.  Reflexes are symmetric and toes are downgoing.   LABORATORY DATA:  I-STAT 8 is unremarkable. Hematocrit 53, total CK 280,  Dilantin level is 3.3. Urine drug screen is positive for cocaine. Alcohol  and tricyclics are negative.   DIAGNOSTIC IMPRESSION:  CT of the head is pending.   IMPRESSION:  1. Epilepsy with breakthrough seizures and non-therapeutic Dilantin level.  2. Prolonged post ictal encephalopathy secondary to epilepsy.  3. Likely poor mediation compliance.  4. Active cocaine abuse.   PLAN:  1. Will admit to the step-down unit until he wakes up.  2. Will give a loading dose of Dilantin now.  3. Will check CT and EEG.                                                 Michael L. Edan Ranger, M.D.    MLR/MEDQ  D:  11/01/2002  T:  11/01/2002  Job:  [id number]

## 2011-05-10 ENCOUNTER — Emergency Department (HOSPITAL_COMMUNITY)
Admission: EM | Admit: 2011-05-10 | Discharge: 2011-05-11 | Disposition: A | Discharge status: Home / Self Care | Payer: Medicare Other | Attending: Emergency Medicine | Admitting: Emergency Medicine

## 2011-05-10 ENCOUNTER — Emergency Department (HOSPITAL_COMMUNITY): Payer: Medicare Other

## 2011-05-10 DIAGNOSIS — R4182 Altered mental status, unspecified: Secondary | ICD-10-CM | POA: Insufficient documentation

## 2011-05-10 DIAGNOSIS — I10 Essential (primary) hypertension: Secondary | ICD-10-CM | POA: Insufficient documentation

## 2011-05-10 DIAGNOSIS — R109 Unspecified abdominal pain: Secondary | ICD-10-CM | POA: Insufficient documentation

## 2011-05-10 DIAGNOSIS — G40909 Epilepsy, unspecified, not intractable, without status epilepticus: Secondary | ICD-10-CM | POA: Insufficient documentation

## 2011-05-10 DIAGNOSIS — R4789 Other speech disturbances: Secondary | ICD-10-CM | POA: Insufficient documentation

## 2011-05-10 DIAGNOSIS — Z79899 Other long term (current) drug therapy: Secondary | ICD-10-CM | POA: Insufficient documentation

## 2011-05-10 DIAGNOSIS — F1010 Alcohol abuse, uncomplicated: Secondary | ICD-10-CM | POA: Insufficient documentation

## 2011-05-10 DIAGNOSIS — R079 Chest pain, unspecified: Secondary | ICD-10-CM | POA: Insufficient documentation

## 2011-05-10 LAB — URINALYSIS, ROUTINE W REFLEX MICROSCOPIC
Nitrite: NEGATIVE
Specific Gravity, Urine: 1.011 (ref 1.005–1.030)
Urobilinogen, UA: 0.2 mg/dL (ref 0.0–1.0)
pH: 5 (ref 5.0–8.0)

## 2011-05-11 LAB — COMPREHENSIVE METABOLIC PANEL
ALT: 12 U/L (ref 0–53)
AST: 15 U/L (ref 0–37)
Albumin: 4.1 g/dL (ref 3.5–5.2)
Alkaline Phosphatase: 77 U/L (ref 39–117)
BUN: 10 mg/dL (ref 6–23)
Chloride: 108 mEq/L (ref 96–112)
Potassium: 4 mEq/L (ref 3.5–5.1)
Sodium: 140 mEq/L (ref 135–145)
Total Bilirubin: 0.3 mg/dL (ref 0.3–1.2)
Total Protein: 7.5 g/dL (ref 6.0–8.3)

## 2011-05-11 LAB — RAPID URINE DRUG SCREEN, HOSP PERFORMED
Amphetamines: NOT DETECTED
Barbiturates: NOT DETECTED
Cocaine: NOT DETECTED
Opiates: NOT DETECTED
Tetrahydrocannabinol: NOT DETECTED

## 2011-05-11 LAB — CBC
HCT: 47.3 % (ref 39.0–52.0)
Hemoglobin: 16.8 g/dL (ref 13.0–17.0)
RDW: 14.1 % (ref 11.5–15.5)
WBC: 10 10*3/uL (ref 4.0–10.5)

## 2011-05-11 LAB — DIFFERENTIAL
Basophils Absolute: 0 10*3/uL (ref 0.0–0.1)
Basophils Relative: 0 % (ref 0–1)
Lymphocytes Relative: 24 % (ref 12–46)
Neutro Abs: 7 10*3/uL (ref 1.7–7.7)

## 2011-05-11 LAB — TROPONIN I: Troponin I: <0.3 ng/mL (ref <0.30)

## 2011-05-11 LAB — LIPASE, BLOOD: Lipase: 44 U/L (ref 11–59)

## 2011-05-11 MED ORDER — IOHEXOL 300 MG/ML  SOLN
100.0000 mL | Freq: Once | INTRAMUSCULAR | Status: AC | PRN
  Administered 2011-05-11: 100 mL via INTRAVENOUS

## 2011-06-17 ENCOUNTER — Encounter (HOSPITAL_COMMUNITY): Payer: Self-pay

## 2011-06-17 ENCOUNTER — Emergency Department (HOSPITAL_COMMUNITY)
Admission: EM | Admit: 2011-06-17 | Discharge: 2011-06-17 | Disposition: A | Discharge status: Home / Self Care | Payer: Medicare Other | Attending: Emergency Medicine | Admitting: Emergency Medicine

## 2011-06-17 ENCOUNTER — Emergency Department (HOSPITAL_COMMUNITY): Payer: Medicare Other

## 2011-06-17 DIAGNOSIS — R51 Headache: Secondary | ICD-10-CM | POA: Insufficient documentation

## 2011-06-17 DIAGNOSIS — G40909 Epilepsy, unspecified, not intractable, without status epilepticus: Secondary | ICD-10-CM | POA: Insufficient documentation

## 2011-06-17 DIAGNOSIS — I10 Essential (primary) hypertension: Secondary | ICD-10-CM | POA: Insufficient documentation

## 2011-06-17 DIAGNOSIS — Z91199 Patient's noncompliance with other medical treatment and regimen due to unspecified reason: Secondary | ICD-10-CM | POA: Insufficient documentation

## 2011-06-17 DIAGNOSIS — Z9119 Patient's noncompliance with other medical treatment and regimen: Secondary | ICD-10-CM | POA: Insufficient documentation

## 2011-06-17 DIAGNOSIS — R29898 Other symptoms and signs involving the musculoskeletal system: Secondary | ICD-10-CM | POA: Insufficient documentation

## 2011-06-17 LAB — RAPID URINE DRUG SCREEN, HOSP PERFORMED
Amphetamines: NOT DETECTED
Benzodiazepines: NOT DETECTED
Cocaine: POSITIVE — AB

## 2011-06-17 LAB — URINALYSIS, ROUTINE W REFLEX MICROSCOPIC
Glucose, UA: NEGATIVE mg/dL
Leukocytes, UA: NEGATIVE
Nitrite: NEGATIVE
Specific Gravity, Urine: 1.012 (ref 1.005–1.030)
pH: 5.5 (ref 5.0–8.0)

## 2011-06-17 LAB — BASIC METABOLIC PANEL
BUN: 7 mg/dL (ref 6–23)
CO2: 21 mEq/L (ref 19–32)
Chloride: 108 mEq/L (ref 96–112)
Creatinine, Ser: 0.9 mg/dL (ref 0.50–1.35)
GFR calc Af Amer: >60 mL/min (ref >60)
Potassium: 3.5 mEq/L (ref 3.5–5.1)

## 2011-06-17 LAB — CBC
HCT: 45.8 % (ref 39.0–52.0)
MCHC: 34.5 g/dL (ref 30.0–36.0)
RDW: 14 % (ref 11.5–15.5)

## 2011-06-17 LAB — DIFFERENTIAL
Basophils Relative: 0 % (ref 0–1)
Eosinophils Relative: 0 % (ref 0–5)
Lymphs Abs: 2.5 10*3/uL (ref 0.7–4.0)
Monocytes Absolute: 1.2 10*3/uL — ABNORMAL HIGH (ref 0.1–1.0)
Monocytes Relative: 8 % (ref 3–12)
Neutro Abs: 11.2 10*3/uL — ABNORMAL HIGH (ref 1.7–7.7)

## 2011-06-17 LAB — ETHANOL: Alcohol, Ethyl (B): 105 mg/dL — ABNORMAL HIGH (ref 0–11)

## 2011-09-03 LAB — I-STAT 8, (EC8 V) (CONVERTED LAB)
Acid-base deficit: 4 — ABNORMAL HIGH
Bicarbonate: 22.7
HCT: 55 — ABNORMAL HIGH
Operator id: 284141
Sodium: 140
TCO2: 24
pCO2, Ven: 44.3 — ABNORMAL LOW

## 2011-09-03 LAB — POCT CARDIAC MARKERS
CKMB, poc: <1 — ABNORMAL LOW
Troponin i, poc: <0.05

## 2011-09-03 LAB — POCT I-STAT CREATININE
Creatinine, Ser: 1
Operator id: 284141

## 2011-09-04 LAB — I-STAT 8, (EC8 V) (CONVERTED LAB)
BUN: 11
Bicarbonate: 24.5 — ABNORMAL HIGH
Chloride: 106
HCT: 55 — ABNORMAL HIGH
Hemoglobin: 18.7 — ABNORMAL HIGH
Operator id: 294511
Potassium: 4.1
Sodium: 140

## 2011-09-04 LAB — ETHANOL: Alcohol, Ethyl (B): <5

## 2011-09-08 LAB — PHENYTOIN LEVEL, TOTAL: Phenytoin Lvl: <2.5 — ABNORMAL LOW

## 2011-09-08 LAB — POCT I-STAT, CHEM 8
BUN: 7
Calcium, Ion: 1.17
Creatinine, Ser: 1.1
TCO2: 23

## 2011-09-10 LAB — COMPREHENSIVE METABOLIC PANEL
AST: 19
Albumin: 3.7
Alkaline Phosphatase: 77
Chloride: 106
GFR calc Af Amer: >60
Potassium: 3.7
Total Bilirubin: 0.6

## 2011-09-10 LAB — ETHANOL: Alcohol, Ethyl (B): 140 — ABNORMAL HIGH

## 2011-09-10 LAB — URINALYSIS, ROUTINE W REFLEX MICROSCOPIC
Glucose, UA: NEGATIVE
Hgb urine dipstick: NEGATIVE
Ketones, ur: NEGATIVE
Protein, ur: NEGATIVE

## 2011-09-10 LAB — DIFFERENTIAL
Basophils Absolute: 0
Basophils Relative: 0
Eosinophils Relative: 0
Lymphocytes Relative: 12
Monocytes Absolute: 0.3

## 2011-09-10 LAB — CBC
Platelets: 269
WBC: 12.3 — ABNORMAL HIGH

## 2011-09-10 LAB — RAPID URINE DRUG SCREEN, HOSP PERFORMED
Amphetamines: NOT DETECTED
Benzodiazepines: NOT DETECTED

## 2011-09-11 LAB — POCT I-STAT, CHEM 8
Calcium, Ion: 1.14
Chloride: 108
Creatinine, Ser: 1.4
Glucose, Bld: 83
HCT: 48

## 2011-09-18 LAB — COMPREHENSIVE METABOLIC PANEL
BUN: 10 mg/dL (ref 6–23)
Calcium: 9.3 mg/dL (ref 8.4–10.5)
Creatinine, Ser: 1.01 mg/dL (ref 0.4–1.5)
GFR calc non Af Amer: >60 mL/min (ref >60)
Glucose, Bld: 85 mg/dL (ref 70–99)
Total Protein: 7.2 g/dL (ref 6.0–8.3)

## 2011-09-18 LAB — URINALYSIS, ROUTINE W REFLEX MICROSCOPIC
Bilirubin Urine: NEGATIVE
Hgb urine dipstick: NEGATIVE
Ketones, ur: NEGATIVE mg/dL
Nitrite: NEGATIVE
pH: 7 (ref 5.0–8.0)

## 2011-09-18 LAB — DIFFERENTIAL
Basophils Absolute: 0.1 10*3/uL (ref 0.0–0.1)
Eosinophils Relative: 0 % (ref 0–5)
Lymphocytes Relative: 9 % — ABNORMAL LOW (ref 12–46)
Lymphs Abs: 1.1 10*3/uL (ref 0.7–4.0)
Monocytes Absolute: 0.7 10*3/uL (ref 0.1–1.0)

## 2011-09-18 LAB — CBC
HCT: 51 % (ref 39.0–52.0)
Hemoglobin: 16.6 g/dL (ref 13.0–17.0)
MCHC: 32.5 g/dL (ref 30.0–36.0)
MCV: 93.1 fL (ref 78.0–100.0)
RDW: 15.7 % — ABNORMAL HIGH (ref 11.5–15.5)

## 2011-09-21 LAB — I-STAT 8, (EC8 V) (CONVERTED LAB)
BUN: 12
Chloride: 105
HCT: 56 — ABNORMAL HIGH
Hemoglobin: 19 — ABNORMAL HIGH
Operator id: 272551
Potassium: 3.9
Sodium: 139

## 2011-09-22 LAB — I-STAT 8, (EC8 V) (CONVERTED LAB)
Acid-Base Excess: 1
Chloride: 105
HCT: 56 — ABNORMAL HIGH
Hemoglobin: 19 — ABNORMAL HIGH
Operator id: 270111
Potassium: 4.1
Sodium: 140
TCO2: 28

## 2011-09-22 LAB — RAPID URINE DRUG SCREEN, HOSP PERFORMED
Barbiturates: NOT DETECTED
Benzodiazepines: NOT DETECTED
Benzodiazepines: NOT DETECTED
Tetrahydrocannabinol: NOT DETECTED

## 2011-09-22 LAB — URINALYSIS, ROUTINE W REFLEX MICROSCOPIC
Ketones, ur: NEGATIVE
Nitrite: NEGATIVE
Protein, ur: NEGATIVE

## 2011-09-22 LAB — DIFFERENTIAL
Basophils Absolute: 0
Basophils Relative: 0
Eosinophils Absolute: 0 — ABNORMAL LOW
Eosinophils Relative: 0
Lymphocytes Relative: 11 — ABNORMAL LOW

## 2011-09-22 LAB — COMPREHENSIVE METABOLIC PANEL
AST: 15
Alkaline Phosphatase: 70
CO2: 26
Chloride: 106
Creatinine, Ser: 1.06
GFR calc Af Amer: >60
GFR calc non Af Amer: >60
Potassium: 4
Total Bilirubin: 0.8

## 2011-09-22 LAB — CBC
HCT: 48.5
MCV: 93.8
RBC: 5.18
WBC: 10.9 — ABNORMAL HIGH

## 2011-09-22 LAB — POCT I-STAT CREATININE
Creatinine, Ser: 1.1
Operator id: 270111

## 2011-09-28 LAB — CBC
Hemoglobin: 14.2
MCHC: 33.6
MCV: 93.8
RBC: 4.51
WBC: 9.8

## 2011-09-28 LAB — DIFFERENTIAL
Basophils Relative: 0
Lymphs Abs: 2.3
Monocytes Absolute: 0.9 — ABNORMAL HIGH
Monocytes Relative: 10
Neutro Abs: 6.6
Neutrophils Relative %: 67

## 2011-09-28 LAB — I-STAT 8, (EC8 V) (CONVERTED LAB)
Acid-base deficit: 1
Acid-base deficit: 1
BUN: 17
Bicarbonate: 23.2
Chloride: 109
Glucose, Bld: 88
HCT: 47
Hemoglobin: 16
Operator id: 277751
Potassium: 3.9
TCO2: 24
pCO2, Ven: 38.4 — ABNORMAL LOW
pH, Ven: 7.389 — ABNORMAL HIGH

## 2011-09-28 LAB — URINE MICROSCOPIC-ADD ON

## 2011-09-28 LAB — URINALYSIS, ROUTINE W REFLEX MICROSCOPIC
Bilirubin Urine: NEGATIVE
Glucose, UA: NEGATIVE
Protein, ur: NEGATIVE
Urobilinogen, UA: 1

## 2011-09-28 LAB — POCT I-STAT CREATININE
Creatinine, Ser: 1.1
Creatinine, Ser: 1.1
Operator id: 189501

## 2012-01-27 ENCOUNTER — Other Ambulatory Visit: Payer: Self-pay | Admitting: Internal Medicine

## 2012-01-27 DIAGNOSIS — M25561 Pain in right knee: Secondary | ICD-10-CM

## 2012-02-02 ENCOUNTER — Other Ambulatory Visit (HOSPITAL_COMMUNITY): Payer: Medicare Other

## 2012-02-10 ENCOUNTER — Other Ambulatory Visit (HOSPITAL_COMMUNITY): Payer: Medicare Other

## 2012-02-12 ENCOUNTER — Ambulatory Visit (HOSPITAL_COMMUNITY)
Admission: RE | Admit: 2012-02-12 | Discharge: 2012-02-12 | Disposition: A | Discharge status: Home / Self Care | Payer: Medicare Other | Source: Ambulatory Visit | Attending: Internal Medicine | Admitting: Internal Medicine

## 2012-02-12 DIAGNOSIS — M25569 Pain in unspecified knee: Secondary | ICD-10-CM | POA: Insufficient documentation

## 2012-02-12 DIAGNOSIS — M25561 Pain in right knee: Secondary | ICD-10-CM

## 2012-08-23 ENCOUNTER — Encounter (HOSPITAL_COMMUNITY): Payer: Self-pay | Admitting: *Deleted

## 2012-08-23 ENCOUNTER — Emergency Department (HOSPITAL_COMMUNITY)
Admission: EM | Admit: 2012-08-23 | Discharge: 2012-08-23 | Disposition: A | Discharge status: Home / Self Care | Payer: Medicare Other | Attending: Emergency Medicine | Admitting: Emergency Medicine

## 2012-08-23 DIAGNOSIS — M7989 Other specified soft tissue disorders: Secondary | ICD-10-CM | POA: Insufficient documentation

## 2012-08-23 HISTORY — DX: Unspecified convulsions: R56.9

## 2012-08-23 HISTORY — DX: Essential (primary) hypertension: I10

## 2012-08-23 NOTE — ED Notes (Signed)
Called for patient in ED waiting room no answer. 

## 2012-08-23 NOTE — ED Notes (Signed)
Called for pt is waiting room and outside with no response
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
## 2012-09-24 ENCOUNTER — Emergency Department (HOSPITAL_COMMUNITY)
Admission: EM | Admit: 2012-09-24 | Discharge: 2012-09-24 | Disposition: A | Discharge status: Home / Self Care | Payer: Medicare Other | Attending: Emergency Medicine | Admitting: Emergency Medicine

## 2012-09-24 ENCOUNTER — Emergency Department (HOSPITAL_COMMUNITY): Payer: Medicare Other

## 2012-09-24 ENCOUNTER — Encounter (HOSPITAL_COMMUNITY): Payer: Self-pay | Admitting: Emergency Medicine

## 2012-09-24 DIAGNOSIS — Z79899 Other long term (current) drug therapy: Secondary | ICD-10-CM | POA: Insufficient documentation

## 2012-09-24 DIAGNOSIS — F17200 Nicotine dependence, unspecified, uncomplicated: Secondary | ICD-10-CM | POA: Insufficient documentation

## 2012-09-24 DIAGNOSIS — I10 Essential (primary) hypertension: Secondary | ICD-10-CM | POA: Insufficient documentation

## 2012-09-24 DIAGNOSIS — R05 Cough: Secondary | ICD-10-CM

## 2012-09-24 DIAGNOSIS — K920 Hematemesis: Secondary | ICD-10-CM

## 2012-09-24 DIAGNOSIS — J189 Pneumonia, unspecified organism: Secondary | ICD-10-CM

## 2012-09-24 DIAGNOSIS — Z720 Tobacco use: Secondary | ICD-10-CM

## 2012-09-24 DIAGNOSIS — R1110 Vomiting, unspecified: Secondary | ICD-10-CM

## 2012-09-24 DIAGNOSIS — R059 Cough, unspecified: Secondary | ICD-10-CM

## 2012-09-24 LAB — URINALYSIS, ROUTINE W REFLEX MICROSCOPIC
Hgb urine dipstick: NEGATIVE
Nitrite: NEGATIVE
Specific Gravity, Urine: 1.008 (ref 1.005–1.030)
Urobilinogen, UA: 0.2 mg/dL (ref 0.0–1.0)
pH: 6 (ref 5.0–8.0)

## 2012-09-24 LAB — CBC WITH DIFFERENTIAL/PLATELET
Basophils Absolute: 0 10*3/uL (ref 0.0–0.1)
Eosinophils Absolute: 0 10*3/uL (ref 0.0–0.7)
Eosinophils Relative: 0 % (ref 0–5)
HCT: 50.6 % (ref 39.0–52.0)
Lymphocytes Relative: 30 % (ref 12–46)
MCH: 31.8 pg (ref 26.0–34.0)
MCHC: 34.2 g/dL (ref 30.0–36.0)
MCV: 93 fL (ref 78.0–100.0)
Monocytes Absolute: 0.9 10*3/uL (ref 0.1–1.0)
Platelets: 278 10*3/uL (ref 150–400)
RDW: 13.7 % (ref 11.5–15.5)
WBC: 9.7 10*3/uL (ref 4.0–10.5)

## 2012-09-24 LAB — COMPREHENSIVE METABOLIC PANEL
AST: 18 U/L (ref 0–37)
CO2: 28 mEq/L (ref 19–32)
Calcium: 9.4 mg/dL (ref 8.4–10.5)
Creatinine, Ser: 1.24 mg/dL (ref 0.50–1.35)
GFR calc Af Amer: 81 mL/min — ABNORMAL LOW (ref >90)
GFR calc non Af Amer: 70 mL/min — ABNORMAL LOW (ref >90)
Sodium: 138 mEq/L (ref 135–145)
Total Protein: 7.4 g/dL (ref 6.0–8.3)

## 2012-09-24 LAB — TYPE AND SCREEN
ABO/RH(D): A POS
Antibody Screen: NEGATIVE

## 2012-09-24 LAB — RAPID URINE DRUG SCREEN, HOSP PERFORMED
Amphetamines: NOT DETECTED
Barbiturates: NOT DETECTED
Benzodiazepines: NOT DETECTED
Cocaine: NOT DETECTED
Tetrahydrocannabinol: NOT DETECTED

## 2012-09-24 MED ORDER — AZITHROMYCIN 250 MG PO TABS: 250.0000 mg | ORAL_TABLET | Freq: Every day | ORAL | Status: DC

## 2012-09-24 MED ORDER — SODIUM CHLORIDE 0.9 % IV BOLUS (SEPSIS)
1000.0000 mL | Freq: Once | INTRAVENOUS | Status: AC
  Administered 2012-09-24: 1000 mL via INTRAVENOUS

## 2012-09-24 MED ORDER — LACTATED RINGERS IV BOLUS (SEPSIS)
2000.0000 mL | Freq: Once | INTRAVENOUS | Status: AC
  Administered 2012-09-24: 1000 mL via INTRAVENOUS

## 2012-09-24 MED ORDER — PROMETHAZINE-CODEINE 6.25-10 MG/5ML PO SYRP: 5.0000 mL | ORAL_SOLUTION | ORAL | Status: DC | PRN

## 2012-09-24 MED ORDER — RANITIDINE HCL 150 MG PO CAPS: 150.0000 mg | ORAL_CAPSULE | Freq: Every day | ORAL | Status: DC

## 2012-09-24 MED ORDER — AZITHROMYCIN 250 MG PO TABS
500.0000 mg | ORAL_TABLET | Freq: Once | ORAL | Status: AC
  Administered 2012-09-24: 500 mg via ORAL
  Filled 2012-09-24: qty 2

## 2012-09-24 NOTE — ED Notes (Signed)
Reports onset of vomiting yesterday afternoon around 1400hrs dark blood x3. Then started again vomiting around 1000am dark blood has lost count of vomiting. Now experiencing lower abd. Pain.

## 2012-09-24 NOTE — ED Provider Notes (Signed)
History     CSN: [id number]  Arrival date & time 09/24/12  1606   First MD Initiated Contact with Patient 09/24/12 1645      Chief Complaint  Patient presents with  . Emesis    (Consider location/radiation/quality/duration/timing/severity/associated sxs/prior treatment) HPI Richard [name] is a 41 y.o. male the past medical history significant for hypertension and seizures for which he's taking Dilantin-he has not had a seizure in a long time, unsure of the last one. He comes to the emergency department today complaining of vomiting blood that started yesterday about 2:00 in the afternoon. Says he was just raking leaves and after a coughing fit went inside and then threw up "a puddle" of thin dark blood. He had 2 other such episodes yesterday. Patient says he had multiple episodes today and has lost count vomiting episodes today of blood.  Today complaining about lower abdominal pain that is dull, aching, moderate, was associated hematuria earlier. No other alleviating or exacerbating factors and no other associated symptoms.   Patient denies any alcohol abuse, says he used to drink more frequently on the weekends but does not drink as much anymore. Denies any illicit drug use. No blood in his stools, or black stools. He does not think he is coughing up of blood he thinks he is vomiting he has got no upper abdominal pain no history of peptic ulcer disease, no history of GERD. No chest pain associated with this or shortness of breath. No recent illnesses cough cold runny nose fevers chills or sinus pain or pressure.  He has had no lightheadedness, dizziness, seizures.   Past Medical History  Diagnosis Date  . Seizures   . Hypertension     History reviewed. No pertinent past surgical history.  History reviewed. No pertinent family history.  History  Substance Use Topics  . Smoking status: Current Every Day Smoker  . Smokeless tobacco: Not on file  . Alcohol Use: Yes      Review of  Systems At least 10pt or greater review of systems completed and are negative except where specified in the HPI.  Allergies  Review of patient's allergies indicates no known allergies.  Home Medications   Current Outpatient Rx  Name Route Sig Dispense Refill  . LISINOPRIL 40 MG PO TABS Oral Take 40 mg by mouth daily.    Marland Kitchen METOPROLOL SUCCINATE ER 50 MG PO TB24 Oral Take 50 mg by mouth daily. Take with or immediately following a meal.    . PHENYTOIN 50 MG PO CHEW Oral Chew 50 mg by mouth 2 (two) times daily.      BP 155/104  Pulse 76  Temp 98.1 F (36.7 C) (Oral)  Resp 18  SpO2 96%  Physical Exam  Nursing notes reviewed.  Electronic medical record reviewed. VITAL SIGNS:   Filed Vitals:   09/24/12 1618  BP: 155/104  Pulse: 76  Temp: 98.1 F (36.7 C)  TempSrc: Oral  Resp: 18  SpO2: 96%   CONSTITUTIONAL: Awake, oriented, appears non-toxic HENT: Atraumatic, normocephalic, oral mucosa pink and moist, airway patent. Nares patent without drainage. External ears normal. EYES: Conjunctiva clear, EOMI, PERRLA NECK: Trachea midline, non-tender, supple CARDIOVASCULAR: Normal heart rate, Normal rhythm, No murmurs, rubs, gallops PULMONARY/CHEST: Clear to auscultation, no rhonchi, wheezes, or rales. Diminished and symmetrical breath sounds. Non-tender. ABDOMINAL: Non-distended, soft, non-tender - no rebound or guarding.  BS normal. GU: Normal circumcised male, no blood at the meatus, testicles are edematous. Nontender to palpation, no hernia appreciated,No rash,  discharge or skin lesions.   NEUROLOGIC: Non-focal, moving all four extremities, no gross sensory or motor deficits. EXTREMITIES: No clubbing, cyanosis, or edema SKIN: Warm, Dry, No erythema, No rash  ED Course  Procedures (including critical care time)  Date: 09/24/2012  Rate: 67  Rhythm: normal sinus rhythm  QRS Axis: normal  Intervals: normal  ST/T Wave abnormalities: ST elevation in V3 V4 and V5  Conduction  Disutrbances: Increased voltage in the precordial leads suggestive of LVH  Narrative Interpretation: No significant changes when viewed with prior EKG dated 05/10/2011: Patient has ST elevations in V3 V4 and V5 with a large voltage QRS complex suggestive of left ventricular hypertrophy, on his EKG today there is suggestion of early repolarization seen in J-point elevation in V3. No concerning ST or T wave abnormalities suggestive of ischemia or infarction    Labs Reviewed  CBC WITH DIFFERENTIAL  COMPREHENSIVE METABOLIC PANEL  URINALYSIS, ROUTINE W REFLEX MICROSCOPIC  TYPE AND SCREEN  PROTIME-INR  APTT  PHENYTOIN LEVEL, TOTAL  URINE RAPID DRUG SCREEN (HOSP PERFORMED)   lab interface down: Protime is 11.7 INR 0.6 PTT 32.8 Urine drug screen is completely negative for amphetamines, barbiturates, benzodiazepines, THC, cocaine and opiates. Urinalysis is unremarkable. CBC shows white count 9.68, RBC 5.4 fourth H&H of 17.3 and 50.6- platelets 278 chemistries sodium 138, potassium 4.3, chloride 101, CO2 28, glucose 93, BUN 12, creatinine 1.24, bilirubin total 0.2, alkaline phosphatase 82, AST 18, ALT 15, total protein 7.4, albumin 3.9, calcium 9.4, anion gap 9.   Dg Chest 2 View  09/24/2012  *RADIOLOGY REPORT*  Clinical Data: Vomiting.  Cigarette smoker.  CHEST - 2 VIEW  Comparison: 07/28/2008.  Findings: There is diffuse interstitial prominence. Cardiopericardial silhouette is within normal limits for projection.  There is no focal airspace consolidation.  No effusion.  Thickening of the fissures is present on the lateral view.  IMPRESSION: Diffuse interstitial prominence can be associated with interstitial pulmonary edema or atypical infection including mycoplasma.   Original Report Authenticated By: Andreas Newport, M.D.     1. Community acquired pneumonia   2. Cough   3. Post-tussive emesis   4. Hematemesis   5. Tobacco abuse     MDM  Richard [name] is a 41 y.o. male patient is had coughing  while in the emergency department tonight, but he has not vomited up blood. Patient's H&H is adequate exit appears a little bit hemoconcentrated-will treat him with some intravenous fluids.   Chest x-ray shows a diffuse interstitial prominence - atypical infection including mycoplasma as mentioned, this is consistent with his bilateral decrease in breath sounds, and frequent cough that he's had for about over a week. Patient is also a smoker.  Patient denies, when asked on 2 separate occasions that he's ever been a heavy drinker. Considering he has not had any episodes of hemoptysis yet in the ER, a series of gout the patient is having bleeding esophageal varices-in addition patient vital signs were perfectly normal even be slightly hypertensive. Also considering a peptic ulcer disease, erosive gastritis, MAC Mallory-Weiss syndrome, pneumonia. Based on the patient's chest x-ray is more than likely the patient's having some hemoptysis occasionally swallowing of the secretions, and buys on admission is having posttussive vomiting like vomiting some blood up.  I do not think this represents a pulmonary embolism or pulmonary infarct not consistent with chest x-ray again chest x-ray is more consistent with atypical pneumonia. Will discharge the patient with antibiotics and cough syrup.  Patient is to followup  with gastrointestinal specialist on Monday should his symptoms persist however I think that once his respiratory symptoms improve, his vomiting will improve.  I explained the diagnosis and have given explicit precautions to return to the ER including worsening symptoms, abdominal pain, difficulty breathing, chest pain, high fever, abnormal breath sounds or any other new or worsening symptoms. The patient understands and accepts the medical plan as it's been dictated and I have answered their questions. Discharge instructions concerning home care and prescriptions have been given.  The patient is STABLE and is  discharged to home in good condition.        Jones Skene, MD 09/24/12 2147

## 2012-10-29 ENCOUNTER — Emergency Department (HOSPITAL_COMMUNITY): Payer: Medicare Other

## 2012-10-29 ENCOUNTER — Observation Stay (HOSPITAL_COMMUNITY)
Admission: EM | Admit: 2012-10-29 | Discharge: 2012-10-31 | Disposition: A | Discharge status: Home / Self Care | Payer: Medicare Other | Attending: Family Medicine | Admitting: Family Medicine

## 2012-10-29 ENCOUNTER — Encounter (HOSPITAL_COMMUNITY): Payer: Self-pay | Admitting: *Deleted

## 2012-10-29 DIAGNOSIS — I10 Essential (primary) hypertension: Secondary | ICD-10-CM

## 2012-10-29 DIAGNOSIS — G40909 Epilepsy, unspecified, not intractable, without status epilepticus: Secondary | ICD-10-CM | POA: Insufficient documentation

## 2012-10-29 DIAGNOSIS — R569 Unspecified convulsions: Secondary | ICD-10-CM | POA: Diagnosis present

## 2012-10-29 DIAGNOSIS — R079 Chest pain, unspecified: Secondary | ICD-10-CM | POA: Insufficient documentation

## 2012-10-29 DIAGNOSIS — F17200 Nicotine dependence, unspecified, uncomplicated: Secondary | ICD-10-CM | POA: Diagnosis present

## 2012-10-29 DIAGNOSIS — J321 Chronic frontal sinusitis: Secondary | ICD-10-CM

## 2012-10-29 DIAGNOSIS — G4440 Drug-induced headache, not elsewhere classified, not intractable: Principal | ICD-10-CM | POA: Insufficient documentation

## 2012-10-29 DIAGNOSIS — I160 Hypertensive urgency: Secondary | ICD-10-CM

## 2012-10-29 DIAGNOSIS — G43809 Other migraine, not intractable, without status migrainosus: Secondary | ICD-10-CM | POA: Diagnosis present

## 2012-10-29 DIAGNOSIS — R519 Headache, unspecified: Secondary | ICD-10-CM | POA: Diagnosis present

## 2012-10-29 DIAGNOSIS — D72829 Elevated white blood cell count, unspecified: Secondary | ICD-10-CM | POA: Insufficient documentation

## 2012-10-29 DIAGNOSIS — R51 Headache: Secondary | ICD-10-CM

## 2012-10-29 DIAGNOSIS — R42 Dizziness and giddiness: Secondary | ICD-10-CM | POA: Insufficient documentation

## 2012-10-29 LAB — BASIC METABOLIC PANEL
BUN: 13 mg/dL (ref 6–23)
GFR calc Af Amer: >90 mL/min (ref >90)
GFR calc non Af Amer: >90 mL/min (ref >90)
Potassium: 4 mEq/L (ref 3.5–5.1)
Sodium: 139 mEq/L (ref 135–145)

## 2012-10-29 LAB — CBC WITH DIFFERENTIAL/PLATELET
Basophils Absolute: 0 10*3/uL (ref 0.0–0.1)
Basophils Relative: 0 % (ref 0–1)
Eosinophils Absolute: 0 10*3/uL (ref 0.0–0.7)
MCH: 31.4 pg (ref 26.0–34.0)
MCHC: 34.1 g/dL (ref 30.0–36.0)
Monocytes Relative: 10 % (ref 3–12)
Neutrophils Relative %: 53 % (ref 43–77)
Platelets: 258 10*3/uL (ref 150–400)
RDW: 13.8 % (ref 11.5–15.5)

## 2012-10-29 LAB — RAPID URINE DRUG SCREEN, HOSP PERFORMED
Benzodiazepines: NOT DETECTED
Cocaine: NOT DETECTED
Opiates: NOT DETECTED
Tetrahydrocannabinol: NOT DETECTED

## 2012-10-29 MED ORDER — SODIUM CHLORIDE 0.9 % IV BOLUS (SEPSIS)
1000.0000 mL | Freq: Once | INTRAVENOUS | Status: AC
  Administered 2012-10-29: 1000 mL via INTRAVENOUS

## 2012-10-29 MED ORDER — LABETALOL HCL 5 MG/ML IV SOLN
20.0000 mg | Freq: Once | INTRAVENOUS | Status: AC
  Administered 2012-10-29: 20 mg via INTRAVENOUS
  Filled 2012-10-29: qty 4

## 2012-10-29 MED ORDER — DIPHENHYDRAMINE HCL 50 MG/ML IJ SOLN: 25.0000 mg | Freq: Once | INTRAMUSCULAR | Status: DC

## 2012-10-29 MED ORDER — NICARDIPINE HCL IN NACL 20-0.86 MG/200ML-% IV SOLN
5.0000 mg/h | Freq: Once | INTRAVENOUS | Status: DC
  Filled 2012-10-29 (×2): qty 200

## 2012-10-29 MED ORDER — METOCLOPRAMIDE HCL 5 MG/ML IJ SOLN: 10.0000 mg | Freq: Once | INTRAMUSCULAR | Status: DC

## 2012-10-29 MED ORDER — DEXAMETHASONE SODIUM PHOSPHATE 4 MG/ML IJ SOLN: 10.0000 mg | Freq: Once | INTRAMUSCULAR | Status: DC

## 2012-10-29 MED ORDER — HYDRALAZINE HCL 20 MG/ML IJ SOLN
10.0000 mg | Freq: Once | INTRAMUSCULAR | Status: AC
  Administered 2012-10-29: 10 mg via INTRAVENOUS
  Filled 2012-10-29: qty 1

## 2012-10-29 NOTE — ED Notes (Signed)
Holding medication per MD Rancour due to improved pt BP and headache.
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
## 2012-10-29 NOTE — ED Provider Notes (Signed)
History     CSN: [id number]  Arrival date & time 10/29/12  1825   First MD Initiated Contact with Patient 10/29/12 1847      Chief Complaint  Patient presents with  . Headache  . Hypertension  . Chest Pain    (Consider location/radiation/quality/duration/timing/severity/associated sxs/prior treatment) HPI Comments: Patient complains of a headache that was sudden onset one hour ago gradual onset. Sharp right retro-orbital pain consistent with prior headaches in the past. He had a traumatic brain injury approximately 20 years ago and has had chronic headaches since then. He also has some photophobia. He states his head hurts when his blood pressure gets high but he has been adherent to blood pressure medicines at home. He also states he has been having intermittent chest pain on his left side that is a dull pain it does not radiate it is a 5/10. He is also had this multiple times before in the past year. Nothing makes it better or worse, is comes on spontaneously and resolved spontaneously after several minutes to hours. He denies any shortness of breath or nausea, diaphoresis, lightheadedness, palpitations  Patient is a 41 y.o. male presenting with chest pain and headaches. The history is provided by the patient. No language interpreter was used.  Chest Pain The chest pain began more  than 1 month ago. Duration of episode(s) is 1 hour. Chest pain occurs frequently. The chest pain is worsening. At its most intense, the pain is at 8/10. The pain is currently at 5/10. The severity of the pain is moderate. The quality of the pain is described as dull. The pain does not radiate. Primary symptoms include cough (chronic). Pertinent negatives for primary symptoms include no fever, no shortness of breath, no palpitations, no abdominal pain, no nausea, no vomiting and no dizziness.  Pertinent negatives for associated symptoms include no claudication, no diaphoresis, no lower extremity edema and no  near-syncope. Risk factors include male gender.  His past medical history is significant for hypertension.  Pertinent negatives for past medical history include no CAD and no seizures.    Headache  This is a recurrent problem. The current episode started 1 to 2 hours ago. The problem occurs constantly. The problem has not changed since onset.The headache is associated with bright light. The pain is located in the left unilateral region. The quality of the pain is described as sharp. The pain is at a severity of 10/10. The pain is severe. The pain does not radiate. Pertinent negatives include no fever, no near-syncope, no palpitations, no shortness of breath, no nausea and no vomiting. He has tried nothing for the symptoms. The treatment provided no relief.    Past Medical History  Diagnosis Date  . Seizures   . Hypertension     History reviewed. No pertinent past surgical history.  History reviewed. No pertinent family history.  History  Substance Use Topics  . Smoking status: Current Every Day Smoker  . Smokeless tobacco: Not on file  . Alcohol Use: Yes      Review of Systems  Constitutional: Negative for fever, diaphoresis, activity change and appetite change.  HENT: Negative for sore throat and neck pain.   Eyes: Negative for discharge and visual disturbance.  Respiratory: Positive for cough (chronic). Negative for choking and shortness of breath.   Cardiovascular: Positive for chest pain. Negative for palpitations, claudication, leg swelling and near-syncope.  Gastrointestinal: Negative for nausea, vomiting, abdominal pain, diarrhea and constipation.  Genitourinary: Negative for dysuria and difficulty  urinating.  Musculoskeletal: Negative for back pain and arthralgias.  Skin: Negative for color change and pallor.  Neurological: Positive for headaches. Negative for dizziness, seizures, speech difficulty and light-headedness.  Psychiatric/Behavioral: Negative for behavioral  problems and agitation.  All other systems reviewed and are negative.    Allergies  Review of patient's allergies indicates no known allergies.  Home Medications   Current Outpatient Rx  Name  Route  Sig  Dispense  Refill  . LISINOPRIL 40 MG PO TABS   Oral   Take 40 mg by mouth daily.         Marland Kitchen METOPROLOL SUCCINATE ER 50 MG PO TB24   Oral   Take 50 mg by mouth daily. Take with or immediately following a meal.         . PHENYTOIN 50 MG PO CHEW   Oral   Chew 50 mg by mouth 2 (two) times daily.         Marland Kitchen RANITIDINE HCL 150 MG PO CAPS   Oral   Take 1 capsule (150 mg total) by mouth daily.   30 capsule   0     BP 177/114  Pulse 82  Temp 98.2 F (36.8 C) (Oral)  Resp 16  SpO2 96%  Physical Exam  Constitutional: He is oriented to person, place, and time. He appears well-developed. No distress.  HENT:  Head: Normocephalic and atraumatic.  Mouth/Throat: No oropharyngeal exudate.  Eyes: EOM are normal. Pupils are equal, round, and reactive to light. Right eye exhibits no discharge. Left eye exhibits no discharge.  Neck: Normal range of motion. Neck supple. No JVD present.  Cardiovascular: Normal rate, regular rhythm and normal heart sounds.   Pulmonary/Chest: Effort normal and breath sounds normal. No stridor. No respiratory distress. He has no wheezes. He has no rales. He exhibits no tenderness.  Abdominal: Soft. Bowel sounds are normal. There is no tenderness. There is no guarding.  Genitourinary: Penis normal.  Musculoskeletal: Normal range of motion. He exhibits no edema and no tenderness.  Neurological: He is alert and oriented to person, place, and time. He has normal reflexes. He displays normal reflexes. No cranial nerve deficit. He exhibits normal muscle tone. Coordination normal.       photophobia  Skin: Skin is warm and dry. He is not diaphoretic. No erythema. No pallor.  Psychiatric: He has a normal mood and affect. His behavior is normal. Judgment and  thought content normal.    ED Course  Procedures (including critical care time)   Labs Reviewed  CBC WITH DIFFERENTIAL  BASIC METABOLIC PANEL  TROPONIN I  URINE RAPID DRUG SCREEN (HOSP PERFORMED)  POCT I-STAT TROPONIN I   Dg Chest 2 View  10/29/2012  *RADIOLOGY REPORT*  Clinical Data: Dizziness.  Left-sided chest pain.  CHEST - 2 VIEW  Comparison: 09/24/2012 and 07/28/2008  Findings: Mild pulmonary vascular prominence without pulmonary edema.  Slight increased interstitial markings unchanged from most recent exam.  Etiology indeterminate.  No segmental consolidation.  Mildly tortuous aorta.  Heart size top normal in size.  IMPRESSION:  Mild pulmonary vascular prominence without pulmonary edema.  Slight increased interstitial markings unchanged from most recent exam.  Etiology indeterminate.  No segmental consolidation.  No pneumothorax.  Mildly tortuous aorta.  Heart size top normal in size.   Original Report Authenticated By: Lacy Duverney, M.D.    Ct Head Wo Contrast  10/29/2012  *RADIOLOGY REPORT*  Clinical Data: Headache with hypertension.  Chest pain.  CT HEAD WITHOUT CONTRAST  Technique:  Contiguous axial images were obtained from the base of the skull through the vertex without contrast.  Comparison: 06/17/2011.  Findings: There is no evidence for acute infarction, intracranial hemorrhage, mass lesion, hydrocephalus, or extra-axial fluid.  Mild atrophy.  Remote left frontal infarct.  Chronic microvascular ischemic change.  Calvarium intact.  No acute sinus or mastoid fluid.  Little change from priors.  IMPRESSION: Chronic changes as described.  No acute intracranial abnormality is observed.   Original Report Authenticated By: Davonna Belling, M.D.      1. Hypertensive urgency   2. Headache   3. Chest pain at rest      Date: 10/29/2012  Rate: 60  Rhythm: normal sinus rhythm  QRS Axis: normal  Intervals: normal  ST/T Wave abnormalities: STE v2-4  Conduction Disutrbances: none   Narrative Interpretation: nml, repol abnorm in precordial leads  Old EKG Reviewed: No significant changes noted     MDM  Patient presents with 2 complaints. His headache is intermittent and chronic and is identical to his prior headaches. He states it is over his left temple/retro-orbital area and oxygen has helped in the past. He may be consistent with cluster headache but since he is hypertensive this may also be a factor. His second complaint is chest pain which she has also had for over a year and intermittent. He states he's never been told he has heart problems and has never been worked up for cardiac problems in the past. It is not exertional, it occurs at spontaneous random times. It is very atypical for a cardiac etiology but will trend troponins and consider further evaluation via chest pain protocol or as an inpatient depending on how well we can get his blood pressure under control here.  11:21 PM HA improved w/ BP ctrl. CP gone. Will admit for obs and possible augmentation of BP ctrl.  Admitted in stable condition      Warrick Parisian, MD 10/29/12 2322

## 2012-10-30 ENCOUNTER — Encounter (HOSPITAL_COMMUNITY): Payer: Self-pay | Admitting: Internal Medicine

## 2012-10-30 DIAGNOSIS — R079 Chest pain, unspecified: Secondary | ICD-10-CM

## 2012-10-30 DIAGNOSIS — I10 Essential (primary) hypertension: Secondary | ICD-10-CM

## 2012-10-30 DIAGNOSIS — I160 Hypertensive urgency: Secondary | ICD-10-CM | POA: Diagnosis present

## 2012-10-30 DIAGNOSIS — G43809 Other migraine, not intractable, without status migrainosus: Secondary | ICD-10-CM | POA: Diagnosis present

## 2012-10-30 LAB — CBC
HCT: 45.3 % (ref 39.0–52.0)
Hemoglobin: 15.3 g/dL (ref 13.0–17.0)
MCH: 31.4 pg (ref 26.0–34.0)
MCV: 92.8 fL (ref 78.0–100.0)
RBC: 4.88 MIL/uL (ref 4.22–5.81)
WBC: 14.3 10*3/uL — ABNORMAL HIGH (ref 4.0–10.5)

## 2012-10-30 LAB — BASIC METABOLIC PANEL
CO2: 24 mEq/L (ref 19–32)
Calcium: 8.7 mg/dL (ref 8.4–10.5)
Chloride: 106 mEq/L (ref 96–112)
Glucose, Bld: 146 mg/dL — ABNORMAL HIGH (ref 70–99)
Sodium: 139 mEq/L (ref 135–145)

## 2012-10-30 MED ORDER — HYDRALAZINE HCL 20 MG/ML IJ SOLN
10.0000 mg | Freq: Four times a day (QID) | INTRAMUSCULAR | Status: DC | PRN
  Administered 2012-10-30 (×2): 10 mg via INTRAVENOUS
  Filled 2012-10-30 (×2): qty 1

## 2012-10-30 MED ORDER — DIPHENHYDRAMINE HCL 25 MG PO CAPS: 25.0000 mg | ORAL_CAPSULE | Freq: Four times a day (QID) | ORAL | Status: DC | PRN

## 2012-10-30 MED ORDER — OXYCODONE HCL 5 MG PO TABS
5.0000 mg | ORAL_TABLET | ORAL | Status: DC | PRN
  Administered 2012-10-30: 5 mg via ORAL
  Filled 2012-10-30: qty 1

## 2012-10-30 MED ORDER — LABETALOL HCL 5 MG/ML IV SOLN
10.0000 mg | Freq: Once | INTRAVENOUS | Status: AC
  Administered 2012-10-30: 10 mg via INTRAVENOUS
  Filled 2012-10-30: qty 4

## 2012-10-30 MED ORDER — SODIUM CHLORIDE 0.9 % IV SOLN: INTRAVENOUS | Status: DC

## 2012-10-30 MED ORDER — ACETAMINOPHEN 325 MG PO TABS
650.0000 mg | ORAL_TABLET | Freq: Four times a day (QID) | ORAL | Status: DC | PRN
  Administered 2012-10-30 – 2012-10-31 (×2): 650 mg via ORAL
  Filled 2012-10-30 (×2): qty 2

## 2012-10-30 MED ORDER — ACETAMINOPHEN 650 MG RE SUPP: 650.0000 mg | Freq: Four times a day (QID) | RECTAL | Status: DC | PRN

## 2012-10-30 MED ORDER — PREDNISONE 50 MG PO TABS
50.0000 mg | ORAL_TABLET | Freq: Every day | ORAL | Status: DC
  Administered 2012-10-30 – 2012-10-31 (×2): 50 mg via ORAL
  Filled 2012-10-30 (×2): qty 1

## 2012-10-30 MED ORDER — SODIUM CHLORIDE 0.9 % IJ SOLN: 3.0000 mL | INTRAMUSCULAR | Status: DC | PRN

## 2012-10-30 MED ORDER — LISINOPRIL 40 MG PO TABS
40.0000 mg | ORAL_TABLET | Freq: Every day | ORAL | Status: DC
  Administered 2012-10-30 – 2012-10-31 (×2): 40 mg via ORAL
  Filled 2012-10-30 (×2): qty 1

## 2012-10-30 MED ORDER — ONDANSETRON HCL 4 MG/2ML IJ SOLN: 4.0000 mg | Freq: Three times a day (TID) | INTRAMUSCULAR | Status: DC | PRN

## 2012-10-30 MED ORDER — METOPROLOL SUCCINATE ER 50 MG PO TB24
50.0000 mg | ORAL_TABLET | Freq: Every day | ORAL | Status: DC
  Administered 2012-10-30: 50 mg via ORAL
  Filled 2012-10-30 (×2): qty 1

## 2012-10-30 MED ORDER — ZOLPIDEM TARTRATE 5 MG PO TABS: 5.0000 mg | ORAL_TABLET | Freq: Every evening | ORAL | Status: DC | PRN

## 2012-10-30 MED ORDER — FAMOTIDINE 20 MG PO TABS: 20.0000 mg | ORAL_TABLET | Freq: Two times a day (BID) | ORAL | Status: DC

## 2012-10-30 MED ORDER — ONDANSETRON HCL 4 MG PO TABS: 4.0000 mg | ORAL_TABLET | Freq: Four times a day (QID) | ORAL | Status: DC | PRN

## 2012-10-30 MED ORDER — FAMOTIDINE 20 MG PO TABS
20.0000 mg | ORAL_TABLET | Freq: Two times a day (BID) | ORAL | Status: DC
  Administered 2012-10-30 – 2012-10-31 (×3): 20 mg via ORAL
  Filled 2012-10-30 (×4): qty 1

## 2012-10-30 MED ORDER — SODIUM CHLORIDE 0.9 % IV SOLN: 250.0000 mL | INTRAVENOUS | Status: DC | PRN

## 2012-10-30 MED ORDER — ONDANSETRON HCL 4 MG/2ML IJ SOLN
4.0000 mg | Freq: Four times a day (QID) | INTRAMUSCULAR | Status: DC | PRN
  Administered 2012-10-30: 4 mg via INTRAVENOUS
  Filled 2012-10-30: qty 2

## 2012-10-30 MED ORDER — DOXAZOSIN MESYLATE 2 MG PO TABS
2.0000 mg | ORAL_TABLET | Freq: Every day | ORAL | Status: DC
  Administered 2012-10-30 (×2): 2 mg via ORAL
  Filled 2012-10-30 (×3): qty 1

## 2012-10-30 MED ORDER — HYDROMORPHONE HCL PF 1 MG/ML IJ SOLN
0.5000 mg | INTRAMUSCULAR | Status: DC | PRN
  Administered 2012-10-30: 1 mg via INTRAVENOUS
  Filled 2012-10-30: qty 1

## 2012-10-30 MED ORDER — PHENYTOIN 50 MG PO CHEW
50.0000 mg | CHEWABLE_TABLET | Freq: Two times a day (BID) | ORAL | Status: DC
  Filled 2012-10-30 (×4): qty 1

## 2012-10-30 MED ORDER — ALUM & MAG HYDROXIDE-SIMETH 200-200-20 MG/5ML PO SUSP: 30.0000 mL | Freq: Four times a day (QID) | ORAL | Status: DC | PRN

## 2012-10-30 MED ORDER — ENOXAPARIN SODIUM 40 MG/0.4ML ~~LOC~~ SOLN
40.0000 mg | SUBCUTANEOUS | Status: DC
  Filled 2012-10-30 (×2): qty 0.4

## 2012-10-30 MED ORDER — NICOTINE 14 MG/24HR TD PT24
14.0000 mg | MEDICATED_PATCH | Freq: Every day | TRANSDERMAL | Status: DC
  Administered 2012-10-30: 14 mg via TRANSDERMAL
  Filled 2012-10-30 (×2): qty 1

## 2012-10-30 MED ORDER — SODIUM CHLORIDE 0.9 % IJ SOLN
3.0000 mL | Freq: Two times a day (BID) | INTRAMUSCULAR | Status: DC
  Administered 2012-10-30: 3 mL via INTRAVENOUS

## 2012-10-30 MED ORDER — OXYCODONE HCL 5 MG PO TABS
5.0000 mg | ORAL_TABLET | ORAL | Status: DC | PRN
  Administered 2012-10-31: 5 mg via ORAL
  Filled 2012-10-30: qty 1

## 2012-10-30 NOTE — Progress Notes (Signed)
Pt c/o Headache and CP (10/10).  States that the pain is the same as when he first came in and that nothing is helping him.  Dr. Cena Benton notified.  EKG done; 5mg  OxyIR given.  Pt refuses to wear oxygen; states that he had oxygen on last night in the ED and it did not help him, so he does not want it now.  Pt has very bad attitude and says he wants to go home since we are not doing anything for him.  Will continue to monitor.

## 2012-10-30 NOTE — ED Provider Notes (Signed)
I saw and evaluated the patient, reviewed the resident's note and I agree with the findings and plan.  Gradual onset headache x 4 days with elevated BP and intermittent L sided chest pain. ?hx cluster headaches. nonfocal neuro exam.  Uncontrolled BP despite hydralazine and labetalol.  Nicardipine gtt prn.  CRITICAL CARE Performed by: Glynn Octave   Total critical care time: 30  Critical care time was exclusive of separately billable procedures and treating other patients.  Critical care was necessary to treat or prevent imminent or life-threatening deterioration.  Critical care was time spent personally by me on the following activities: development of treatment plan with patient and/or surrogate as well as nursing, discussions with consultants, evaluation of patient's response to treatment, examination of patient, obtaining history from patient or surrogate, ordering and performing treatments and interventions, ordering and review of laboratory studies, ordering and review of radiographic studies, pulse oximetry and re-evaluation of patient's condition.   Glynn Octave, MD 10/30/12 [phone]

## 2012-10-30 NOTE — ED Notes (Signed)
Dr Lovell Sheehan called and made aware of patient condition and blood pressure remaining at present 159/112 orders received to give na additional dose of 10mg  of Hydrazaline now

## 2012-10-30 NOTE — Consult Note (Signed)
NEURO HOSPITALIST CONSULT NOTE    Reason for Consult: Recurrent severe headaches  HPI:                                                                                                                                          Richard [name] is an 41 y.o. male a history of hypertension and seizure disorder as well as chronic headaches who was admitted on 10/29/2012 with intractable severe headache. Patient indicates she's had severe headaches intermittently since he was about 41 years old. He gets ptosis of both eyelids as well as edema involving left side of his face. Headaches tend to effect on the left side. Occasionally gets nasal congestion on the left with severe headache. There is no predictable pattern with respect to headache onset. He is able to go sometimes wakes up to a month without severe headache. Headaches in the past have been treated with oxygen. Headaches do not typically occur daily and can commence during wakefulness after sleep. CT scan of his head on 10/29/2012 showed an old left frontal infarction but was otherwise unremarkable. Family history is negative for migraine headaches.  Past Medical History  Diagnosis Date  . Seizures   . Hypertension     History reviewed. No pertinent past surgical history.  Family History  Problem Relation Age of Onset  . Hypertension Sister   . Diabetes Mellitus II Sister      Social History:  reports that he has been smoking Cigarettes.  He has been smoking about .5 packs per day. He does not have any smokeless tobacco history on file. He reports that he does not drink alcohol or use illicit drugs.  No Known Allergies  MEDICATIONS:                                                                                                                     Scheduled:   . doxazosin  2 mg Oral Daily  . enoxaparin (LOVENOX) injection  40 mg Subcutaneous Q24H  . famotidine  20 mg Oral BID  . [COMPLETED] hydrALAZINE  10 mg  Intravenous Once  . [COMPLETED] labetalol  10 mg Intravenous Once  . [COMPLETED] labetalol  20 mg Intravenous Once  . lisinopril  40 mg Oral Daily  . metoprolol succinate  50 mg Oral Daily  . nicotine  14 mg Transdermal Q1400  . phenytoin  50 mg Oral BID  . predniSONE  50 mg Oral Daily  . [COMPLETED] sodium chloride  1,000 mL Intravenous Once  . sodium chloride  3 mL Intravenous Q12H  . [DISCONTINUED] sodium chloride   Intravenous STAT  . [DISCONTINUED] dexamethasone  10 mg Intravenous Once  . [DISCONTINUED] diphenhydrAMINE  25 mg Intravenous Once  . [DISCONTINUED] famotidine  20 mg Oral BID  . [DISCONTINUED] metoCLOPramide (REGLAN) injection  10 mg Intravenous Once  . [DISCONTINUED] niCARDipine  5 mg/hr Intravenous Once   XBJ:YNWGNF chloride, acetaminophen, acetaminophen, alum & mag hydroxide-simeth, diphenhydrAMINE, hydrALAZINE, ondansetron (ZOFRAN) IV, ondansetron, oxyCODONE, sodium chloride, zolpidem, [DISCONTINUED]  HYDROmorphone (DILAUDID) injection, [DISCONTINUED] ondansetron (ZOFRAN) IV, [DISCONTINUED] oxyCODONE     Blood pressure 153/96, pulse 67, temperature 97.6 F (36.4 C), temperature source Oral, resp. rate 18, height 6' (1.829 m), weight 100.517 kg (221 lb 9.6 oz), SpO2 94.00%.   Neurologic Examination:                                                                                                      Mental Status: Drowsy but easy to arouse; oriented x3.  Speech fluent without evidence of aphasia. Able to follow commands without difficulty. Cranial Nerves: II-Visual fields were normal. III/IV/VI-Pupils were equal and reacted. Extraocular movements were full and conjugate. Bilateral eyelid ptosis.   V/VII-no facial numbness and no facial weakness. VIII-normal. X-normal speech and symmetrical palatal movement. XII-midline tongue extension Motor: 5/5 bilaterally with normal tone and bulk Sensory: Normal throughout. Deep Tendon Reflexes: 2+ and symmetric. Plantars:  Flexor bilaterally Cerebellar: Normal finger-to-nose testing.  No results found for this basename: cbc, bmp, coags, chol, tri, ldl, hga1c    Results for orders placed during the hospital encounter of 10/29/12 (from the past 48 hour(s))  CBC WITH DIFFERENTIAL     Status: Normal   Collection Time   10/29/12  6:33 PM      Component Value Range Comment   WBC 9.4  4.0 - 10.5 K/uL    RBC 4.94  4.22 - 5.81 MIL/uL    Hemoglobin 15.5  13.0 - 17.0 g/dL    HCT 62.1  30.8 - 65.7 %    MCV 91.9  78.0 - 100.0 fL    MCH 31.4  26.0 - 34.0 pg    MCHC 34.1  30.0 - 36.0 g/dL    RDW 84.6  96.2 - 95.2 %    Platelets 258  150 - 400 K/uL    Neutrophils Relative 53  43 - 77 %    Neutro Abs 5.0  1.7 - 7.7 K/uL    Lymphocytes Relative 37  12 - 46 %    Lymphs Abs 3.5  0.7 - 4.0 K/uL    Monocytes Relative 10  3 - 12 %    Monocytes Absolute 0.9  0.1 - 1.0 K/uL    Eosinophils Relative 0  0 - 5 %    Eosinophils Absolute 0.0  0.0 - 0.7 K/uL    Basophils  Relative 0  0 - 1 %    Basophils Absolute 0.0  0.0 - 0.1 K/uL   BASIC METABOLIC PANEL     Status: Normal   Collection Time   10/29/12  6:33 PM      Component Value Range Comment   Sodium 139  135 - 145 mEq/L    Potassium 4.0  3.5 - 5.1 mEq/L HEMOLYSIS AT THIS LEVEL MAY AFFECT RESULT   Chloride 106  96 - 112 mEq/L    CO2 22  19 - 32 mEq/L    Glucose, Bld 99  70 - 99 mg/dL    BUN 13  6 - 23 mg/dL    Creatinine, Ser 4.09  0.50 - 1.35 mg/dL    Calcium 8.8  8.4 - 81.1 mg/dL    GFR calc non Af Amer >90  >90 mL/min    GFR calc Af Amer >90  >90 mL/min   POCT I-STAT TROPONIN I     Status: Normal   Collection Time   10/29/12  6:58 PM      Component Value Range Comment   Troponin i, poc 0.03  0.00 - 0.08 ng/mL    Comment 3            TROPONIN I     Status: Normal   Collection Time   10/29/12  7:01 PM      Component Value Range Comment   Troponin I <0.30  <0.30 ng/mL   URINE RAPID DRUG SCREEN (HOSP PERFORMED)     Status: Normal   Collection Time    10/29/12  7:47 PM      Component Value Range Comment   Opiates NONE DETECTED  NONE DETECTED    Cocaine NONE DETECTED  NONE DETECTED    Benzodiazepines NONE DETECTED  NONE DETECTED    Amphetamines NONE DETECTED  NONE DETECTED    Tetrahydrocannabinol NONE DETECTED  NONE DETECTED    Barbiturates NONE DETECTED  NONE DETECTED   BASIC METABOLIC PANEL     Status: Abnormal   Collection Time   10/30/12  5:33 AM      Component Value Range Comment   Sodium 139  135 - 145 mEq/L    Potassium 3.7  3.5 - 5.1 mEq/L    Chloride 106  96 - 112 mEq/L    CO2 24  19 - 32 mEq/L    Glucose, Bld 146 (*) 70 - 99 mg/dL    BUN 12  6 - 23 mg/dL    Creatinine, Ser 9.14  0.50 - 1.35 mg/dL    Calcium 8.7  8.4 - 78.2 mg/dL    GFR calc non Af Amer >90  >90 mL/min    GFR calc Af Amer >90  >90 mL/min   CBC     Status: Abnormal   Collection Time   10/30/12  5:33 AM      Component Value Range Comment   WBC 14.3 (*) 4.0 - 10.5 K/uL    RBC 4.88  4.22 - 5.81 MIL/uL    Hemoglobin 15.3  13.0 - 17.0 g/dL    HCT 95.6  21.3 - 08.6 %    MCV 92.8  78.0 - 100.0 fL    MCH 31.4  26.0 - 34.0 pg    MCHC 33.8  30.0 - 36.0 g/dL    RDW 57.8  46.9 - 62.9 %    Platelets 250  150 - 400 K/uL     Dg Chest 2 View  10/29/2012  *  RADIOLOGY REPORT*  Clinical Data: Dizziness.  Left-sided chest pain.  CHEST - 2 VIEW  Comparison: 09/24/2012 and 07/28/2008  Findings: Mild pulmonary vascular prominence without pulmonary edema.  Slight increased interstitial markings unchanged from most recent exam.  Etiology indeterminate.  No segmental consolidation.  Mildly tortuous aorta.  Heart size top normal in size.  IMPRESSION:  Mild pulmonary vascular prominence without pulmonary edema.  Slight increased interstitial markings unchanged from most recent exam.  Etiology indeterminate.  No segmental consolidation.  No pneumothorax.  Mildly tortuous aorta.  Heart size top normal in size.   Original Report Authenticated By: Lacy Duverney, M.D.    Ct Head Wo  Contrast  10/29/2012  *RADIOLOGY REPORT*  Clinical Data: Headache with hypertension.  Chest pain.  CT HEAD WITHOUT CONTRAST  Technique:  Contiguous axial images were obtained from the base of the skull through the vertex without contrast.  Comparison: 06/17/2011.  Findings: There is no evidence for acute infarction, intracranial hemorrhage, mass lesion, hydrocephalus, or extra-axial fluid.  Mild atrophy.  Remote left frontal infarct.  Chronic microvascular ischemic change.  Calvarium intact.  No acute sinus or mastoid fluid.  Little change from priors.  IMPRESSION: Chronic changes as described.  No acute intracranial abnormality is observed.   Original Report Authenticated By: Davonna Belling, M.D.      Assessment/Plan: 1. Chronic vascular headaches, probable variant type of cluster headache. 2. History of seizure disorder with likely sub-therapeutic dose of Dilantin (50 mg twice a day).  Recommendations: 1. Agree with trial of prednisone 50 mg per day. 2. If headaches continue will add a trial of Periactin 4 mg every 6 hours. 3. Continue oxycodone when necessary for symptomatic treatment of severe headache.  Venetia Maxon M.D. [facility] [phone]  10/30/2012, 2:23 PM

## 2012-10-30 NOTE — Progress Notes (Signed)
Pt is NOT taking Dilantin.  It is ordered for him BID, but he has refused to take it.  He states he stopped taking it when the MD at [facility] advised him to d/c this medication.  This was about 1-2 months ago.

## 2012-10-30 NOTE — Progress Notes (Signed)
[facility] PROGRESS NOTE  Richard [name] ZOX:[id number] DOB: Nov 07, 1971 DOA: 10/29/2012 PCP: Yisroel Ramming, MD  Assessment/Plan: 1. Hypertensive urgency - Patient reports taking goody powders (NSAID) daily for the last month or more.  This is most likely contributing as well as the pain from the headache to the elevated blood pressures.   - At this point better controlled on cardura, lisinopril, and metoprolol - Will continue to monitor closely.  EKG shows no marked ST elevations or depressions - Troponin negative  2. Headaches - Likely contributing to # 1 - Patient has been given oxycodone IR without much relief - Suspect Medication overuse headache with another underlying headache either cluster or tension type headache. - Will consult neurology for help distinguishing underlying headache as patient is taking too much over the counter NSAIDs and I believe this is making both his headaches worse as well as contributing to uncontrolled hypertension. - At this point given diagnostic uncertainty and elevated blood pressures will avoid triptan, Of note patient mentions that oxygen did not help improve his headaches which goes against cluster headache as underlying cause of discomfort. - CT of head showed no acute intracranial abnormality - Place on prednisone 50 po daily and reevaluate - f/u with neurology's recommendations.  3. Seizure d/o - Pt on Phenytoin - Phenytoin has headache listed as side effect. Could be contributing to # 2 - Neurology consulted today 10/30/12  4. Tobacco abuse - will order nicotine patch - currently not on any nicotine replacement and could be contributing to headache as well as withdrawal symptom.   Code Status: full Family Communication: None at bedside Disposition Plan: Pending improvement in condition likely d/c in 1-2 days   Consultants:  Neurology  Procedures:  CT of head  Antibiotics:  none  HPI/Subjective: Patient mentions that  he takes goody powders everyday and has done so for the last month.  He mentions that his headaches are worse and within the last 2 days have got worse.  His goody powders have not helped.  Oxygen placed in the ED was not helpful.  Is requesting to have his IV line removed.  States he is still having headache and is rubbing his head on the left side while talking to me.  He mentions that the HA is localized at his left temple and is not associated with unilateral rhinorrhea. Patient wishes to smoke a cigarrette at this juncture I have offered a nicotine patch.  He wishes to get some air outside as well and currently not complaining of any chest pain.  His EKG is unchanged and his troponin negative.  Objective: Filed Vitals:   10/30/12 0244 10/30/12 0400 10/30/12 0944 10/30/12 1125  BP: 139/91 134/84 143/96 153/96  Pulse:  61 62 67  Temp:  97.6 F (36.4 C)    TempSrc:  Oral    Resp:  16 18   Height:      Weight:      SpO2:  93% 94%     Intake/Output Summary (Last 24 hours) at 10/30/12 1239 Last data filed at 10/30/12 0700  Gross per 24 hour  Intake      0 ml  Output    560 ml  Net   -560 ml   Filed Weights   10/30/12 0243  Weight: 100.517 kg (221 lb 9.6 oz)    Exam:   General:  Pt looks uncomfortable, non toxic, Alert and Oriented  Cardiovascular: RRR, No MRG  Respiratory: CTA BL, no wheezes  Abdomen:  soft, NT  Data Reviewed: Basic Metabolic Panel:  Lab 10/30/12 6213 10/29/12 1833  NA 139 139  K 3.7 4.0  CL 106 106  CO2 24 22  GLUCOSE 146* 99  BUN 12 13  CREATININE 0.85 0.93  CALCIUM 8.7 8.8  MG -- --  PHOS -- --   Liver Function Tests: No results found for this basename: AST:5,ALT:5,ALKPHOS:5,BILITOT:5,PROT:5,ALBUMIN:5 in the last 168 hours No results found for this basename: LIPASE:5,AMYLASE:5 in the last 168 hours No results found for this basename: AMMONIA:5 in the last 168 hours CBC:  Lab 10/30/12 0533 10/29/12 1833  WBC 14.3* 9.4  NEUTROABS -- 5.0    HGB 15.3 15.5  HCT 45.3 45.4  MCV 92.8 91.9  PLT 250 258   Cardiac Enzymes:  Lab 10/29/12 1901  CKTOTAL --  CKMB --  CKMBINDEX --  TROPONINI <0.30   BNP (last 3 results) No results found for this basename: PROBNP:3 in the last 8760 hours CBG: No results found for this basename: GLUCAP:5 in the last 168 hours  No results found for this or any previous visit (from the past 240 hour(s)).   Studies: Dg Chest 2 View  10/29/2012  *RADIOLOGY REPORT*  Clinical Data: Dizziness.  Left-sided chest pain.  CHEST - 2 VIEW  Comparison: 09/24/2012 and 07/28/2008  Findings: Mild pulmonary vascular prominence without pulmonary edema.  Slight increased interstitial markings unchanged from most recent exam.  Etiology indeterminate.  No segmental consolidation.  Mildly tortuous aorta.  Heart size top normal in size.  IMPRESSION:  Mild pulmonary vascular prominence without pulmonary edema.  Slight increased interstitial markings unchanged from most recent exam.  Etiology indeterminate.  No segmental consolidation.  No pneumothorax.  Mildly tortuous aorta.  Heart size top normal in size.   Original Report Authenticated By: Lacy Duverney, M.D.    Ct Head Wo Contrast  10/29/2012  *RADIOLOGY REPORT*  Clinical Data: Headache with hypertension.  Chest pain.  CT HEAD WITHOUT CONTRAST  Technique:  Contiguous axial images were obtained from the base of the skull through the vertex without contrast.  Comparison: 06/17/2011.  Findings: There is no evidence for acute infarction, intracranial hemorrhage, mass lesion, hydrocephalus, or extra-axial fluid.  Mild atrophy.  Remote left frontal infarct.  Chronic microvascular ischemic change.  Calvarium intact.  No acute sinus or mastoid fluid.  Little change from priors.  IMPRESSION: Chronic changes as described.  No acute intracranial abnormality is observed.   Original Report Authenticated By: Davonna Belling, M.D.     Scheduled Meds:   . doxazosin  2 mg Oral Daily  .  enoxaparin (LOVENOX) injection  40 mg Subcutaneous Q24H  . famotidine  20 mg Oral BID  . [COMPLETED] hydrALAZINE  10 mg Intravenous Once  . [COMPLETED] labetalol  10 mg Intravenous Once  . [COMPLETED] labetalol  20 mg Intravenous Once  . lisinopril  40 mg Oral Daily  . metoprolol succinate  50 mg Oral Daily  . phenytoin  50 mg Oral BID  . predniSONE  50 mg Oral Daily  . [COMPLETED] sodium chloride  1,000 mL Intravenous Once  . sodium chloride  3 mL Intravenous Q12H  . [DISCONTINUED] sodium chloride   Intravenous STAT  . [DISCONTINUED] dexamethasone  10 mg Intravenous Once  . [DISCONTINUED] diphenhydrAMINE  25 mg Intravenous Once  . [DISCONTINUED] famotidine  20 mg Oral BID  . [DISCONTINUED] metoCLOPramide (REGLAN) injection  10 mg Intravenous Once  . [DISCONTINUED] niCARDipine  5 mg/hr Intravenous Once   Continuous Infusions:  Principal Problem:  *Hypertensive urgency Active Problems:  TOBACCO ABUSE  SEIZURE DISORDER  Headache    Time spent: > 40 minutes    Penny Pia  [facility] Pager [phone]. If 8PM-8AM, please contact night-coverage at [email or web address], password [email or web address] 10/30/2012, 12:39 PM  LOS: 1 day

## 2012-10-30 NOTE — H&P (Signed)
[facility] History and Physical  Richard [name] NFA:[id number] DOB: 21-Dec-1970 DOA: 10/29/2012  Referring physician:  PCP: Yisroel Ramming, MD  Specialists:   Chief Complaint: Headache  HPI: Richard [name] is a 41 y.o. male with a history of hypertension and Seizures who presents tot he ED with complaints of a severe constant 10/10 headache which has been worsening over the past 4 days. The headache was worse this evening.  He denies fevers or chills or nausea or vomiting.  He reports having bad headaches for several years and state that at times he has to be placed on oxygen, which make me thing=k that he has cluster headaches, but he does not know if he had been diagnosed as having cluster headaches.  He re[ortas that his blood pressures also become very elevated when he has a bad headache.  And in the Ed he was found to have a blood pressure of 190/124 initially.  He was administered IV labetalol, IV Hydralazine and had a short course of a nicardipine drip which decreased his blood pressure down to 149/62. He was referred for medical admission.     Review of Systems: The patient denies anorexia, fever, weight loss, vision loss, decreased hearing, hoarseness, chest pain, syncope, dyspnea on exertion, peripheral edema, balance deficits, hemoptysis, abdominal pain, melena, hematochezia, severe indigestion/heartburn, hematuria, incontinence, genital sores, muscle weakness, suspicious skin lesions, transient blindness, difficulty walking, depression, unusual weight change, abnormal bleeding, enlarged lymph nodes, angioedema, and breast masses.    Past Medical History  Diagnosis Date  . Seizures   . Hypertension     History reviewed. No pertinent past surgical history.   Medications:  HOME MEDS: Prior to Admission medications   Medication Sig Start Date End Date Taking? Authorizing Provider  lisinopril (PRINIVIL,ZESTRIL) 40 MG tablet Take 40 mg by mouth daily.   Yes Historical Provider,  MD  metoprolol succinate (TOPROL-XL) 50 MG 24 hr tablet Take 50 mg by mouth daily. Take with or immediately following a meal.   Yes Historical Provider, MD  phenytoin (DILANTIN) 50 MG tablet Chew 50 mg by mouth 2 (two) times daily.   Yes Historical Provider, MD  ranitidine (ZANTAC) 150 MG capsule Take 1 capsule (150 mg total) by mouth daily. 09/24/12  Yes John-Adam Bonk, MD    Allergies:  No Known Allergies   Social History:   reports that he has been smoking.  He does not have any smokeless tobacco history on file. He reports that he drinks alcohol. His drug history not on file.   Family History  Problem Relation Age of Onset  . Hypertension Sister   . Diabetes Mellitus II Sister      Physical Exam:  GEN:  Pleasant 41 year old Obese African American Male examined  and in no acute distress; cooperative with exam Filed Vitals:   10/29/12 2315 10/29/12 2315 10/29/12 2330 10/29/12 2345  BP: 190/124 149/62 160/99 179/116  Pulse: 84  82 88  Temp:      TempSrc:      Resp: 22  14   SpO2: 98%  98% 99%   Blood pressure 179/116, pulse 88, temperature 98.2 F (36.8 C), temperature source Oral, resp. rate 14, SpO2 99.00%. PSYCH: He is alert and oriented x4; does not appear anxious does not appear depressed; affect is normal HEENT: Normocephalic and Atraumatic, Mucous membranes pink; PERRLA; EOM intact; Fundi:  Benign;  No scleral icterus, Nares: Patent, Oropharynx: Clear, Fair Dentition, Neck:  FROM, no cervical lymphadenopathy nor thyromegaly or carotid  bruit; no JVD; Breasts:: Not examined CHEST WALL: No tenderness CHEST: Normal respiration, clear to auscultation bilaterally HEART: Regular rate and rhythm; no murmurs rubs or gallops BACK: No kyphosis or scoliosis; no CVA tenderness ABDOMEN: Positive Bowel Sounds, Obese, soft non-tender; no masses, no organomegaly.   Rectal Exam: Not done EXTREMITIES: No bone or joint deformity; age-appropriate arthropathy of the hands and knees; no  cyanosis, clubbing or edema; no ulcerations. Genitalia: not examined PULSES: 2+ and symmetric SKIN: Normal hydration no rash or ulceration CNS: Cranial nerves 2-12 grossly intact no focal neurologic deficit    Labs on Admission:  Basic Metabolic Panel:  Lab 10/29/12 0981  NA 139  K 4.0  CL 106  CO2 22  GLUCOSE 99  BUN 13  CREATININE 0.93  CALCIUM 8.8  MG --  PHOS --   Liver Function Tests: No results found for this basename: AST:5,ALT:5,ALKPHOS:5,BILITOT:5,PROT:5,ALBUMIN:5 in the last 168 hours No results found for this basename: LIPASE:5,AMYLASE:5 in the last 168 hours No results found for this basename: AMMONIA:5 in the last 168 hours CBC:  Lab 10/29/12 1833  WBC 9.4  NEUTROABS 5.0  HGB 15.5  HCT 45.4  MCV 91.9  PLT 258   Cardiac Enzymes:  Lab 10/29/12 1901  CKTOTAL --  CKMB --  CKMBINDEX --  TROPONINI <0.30    BNP (last 3 results) No results found for this basename: PROBNP:3 in the last 8760 hours CBG: No results found for this basename: GLUCAP:5 in the last 168 hours  Radiological Exams on Admission: Dg Chest 2 View  10/29/2012  *RADIOLOGY REPORT*  Clinical Data: Dizziness.  Left-sided chest pain.  CHEST - 2 VIEW  Comparison: 09/24/2012 and 07/28/2008  Findings: Mild pulmonary vascular prominence without pulmonary edema.  Slight increased interstitial markings unchanged from most recent exam.  Etiology indeterminate.  No segmental consolidation.  Mildly tortuous aorta.  Heart size top normal in size.  IMPRESSION:  Mild pulmonary vascular prominence without pulmonary edema.  Slight increased interstitial markings unchanged from most recent exam.  Etiology indeterminate.  No segmental consolidation.  No pneumothorax.  Mildly tortuous aorta.  Heart size top normal in size.   Original Report Authenticated By: Lacy Duverney, M.D.    Ct Head Wo Contrast  10/29/2012  *RADIOLOGY REPORT*  Clinical Data: Headache with hypertension.  Chest pain.  CT HEAD WITHOUT  CONTRAST  Technique:  Contiguous axial images were obtained from the base of the skull through the vertex without contrast.  Comparison: 06/17/2011.  Findings: There is no evidence for acute infarction, intracranial hemorrhage, mass lesion, hydrocephalus, or extra-axial fluid.  Mild atrophy.  Remote left frontal infarct.  Chronic microvascular ischemic change.  Calvarium intact.  No acute sinus or mastoid fluid.  Little change from priors.  IMPRESSION: Chronic changes as described.  No acute intracranial abnormality is observed.   Original Report Authenticated By: Davonna Belling, M.D.     EKG: Independently reviewed.   Assessment: Principal Problem:  *Hypertensive urgency Active Problems:  Headache  TOBACCO ABUSE  SEIZURE DISORDER     Plan: Admit to Telemetry Bed Observation Status IV labetalol and PRN IV hydralazine for elevated blood Pressure Adjust BP Medications Pain control for Headache Reconcile Home Medications DVT Prophylaxis   Code Status:  FULL CODE Family Communication:  N/A Disposition Plan:  Return to Home  Time spent:   Ron Parker [facility] Pager [phone]  If 7PM-7AM, please contact night-coverage [email or web address] Password [facility] 10/30/2012, 12:34 AM

## 2012-10-30 NOTE — ED Notes (Signed)
Placed call to Dr. Lovell Sheehan to make aware of BP 154/105 no new orders received stated that the original bed assignment remains appropriate for patient.

## 2012-10-30 NOTE — ED Notes (Signed)
Gave patient 1mg  of dilaudid and he became nauseated and his HR decreased to 50-60's BP continues to be 160's/100's

## 2012-10-30 NOTE — Progress Notes (Signed)
BP elevated at 165/114.  Pt states he feels great, the best he has felt all day.  He refuses to have IV started for administration of IV meds.  Educated pt on need to have IV access while in the hospital.  Pt appreciates staff for trying to help him, but he is tired of getting stuck & being in/out of hospital.  MD made aware.  Will continue to monitor.

## 2012-10-31 LAB — CBC WITH DIFFERENTIAL/PLATELET
Basophils Absolute: 0 10*3/uL (ref 0.0–0.1)
Basophils Relative: 0 % (ref 0–1)
Eosinophils Absolute: 0 10*3/uL (ref 0.0–0.7)
Eosinophils Relative: 0 % (ref 0–5)
HCT: 46.2 % (ref 39.0–52.0)
Hemoglobin: 15.4 g/dL (ref 13.0–17.0)
MCH: 31.2 pg (ref 26.0–34.0)
MCHC: 33.3 g/dL (ref 30.0–36.0)
MCV: 93.7 fL (ref 78.0–100.0)
Monocytes Absolute: 1.3 10*3/uL — ABNORMAL HIGH (ref 0.1–1.0)
Monocytes Relative: 9 % (ref 3–12)
Neutro Abs: 10 10*3/uL — ABNORMAL HIGH (ref 1.7–7.7)
RDW: 14.3 % (ref 11.5–15.5)

## 2012-10-31 MED ORDER — CYPROHEPTADINE HCL 4 MG PO TABS: 2.0000 mg | ORAL_TABLET | Freq: Three times a day (TID) | ORAL | Status: DC

## 2012-10-31 MED ORDER — CYPROHEPTADINE HCL 4 MG PO TABS
2.0000 mg | ORAL_TABLET | Freq: Three times a day (TID) | ORAL | Status: DC
  Administered 2012-10-31: 2 mg via ORAL
  Filled 2012-10-31 (×3): qty 1

## 2012-10-31 MED ORDER — METOPROLOL SUCCINATE ER 100 MG PO TB24
100.0000 mg | ORAL_TABLET | Freq: Every day | ORAL | Status: DC
  Administered 2012-10-31: 100 mg via ORAL
  Filled 2012-10-31: qty 1

## 2012-10-31 MED ORDER — HYDRALAZINE HCL 10 MG PO TABS
10.0000 mg | ORAL_TABLET | Freq: Four times a day (QID) | ORAL | Status: DC | PRN
  Administered 2012-10-31: 10 mg via ORAL
  Filled 2012-10-31: qty 1

## 2012-10-31 MED ORDER — PREDNISONE 10 MG PO TABS: ORAL_TABLET | ORAL | Status: DC

## 2012-10-31 MED ORDER — DOXAZOSIN MESYLATE 2 MG PO TABS: 2.0000 mg | ORAL_TABLET | Freq: Every day | ORAL | Status: DC

## 2012-10-31 MED ORDER — OXYCODONE HCL 5 MG PO TABS: 5.0000 mg | ORAL_TABLET | ORAL | Status: DC | PRN

## 2012-10-31 MED ORDER — METOPROLOL SUCCINATE ER 50 MG PO TB24: 100.0000 mg | ORAL_TABLET | Freq: Every day | ORAL | Status: DC

## 2012-10-31 NOTE — Progress Notes (Signed)
Subjective: No new complaints. Patient woke up with a headache this morning which responded to analgesic medication with complete resolution. Headache intensity was less today than yesterday. Was no change in character headache. Wants to go home.  Objective: Current vital signs: BP 154/98  Pulse 71  Temp 98.2 F (36.8 C) (Oral)  Resp 18  Ht 6' (1.829 m)  Wt 100.517 kg (221 lb 9.6 oz)  BMI 30.05 kg/m2  SpO2 96%  Neurologic Exam: Alert and in no acute distress. Mental status was normal. No ptosis was present. There was also no indication of left facial swelling. Speech was normal.  Medications:  Scheduled:   . doxazosin  2 mg Oral Daily  . famotidine  20 mg Oral BID  . lisinopril  40 mg Oral Daily  . metoprolol succinate  50 mg Oral Daily  . nicotine  14 mg Transdermal Q1400  . phenytoin  50 mg Oral BID  . predniSONE  50 mg Oral Daily  . sodium chloride  3 mL Intravenous Q12H  . [DISCONTINUED] enoxaparin (LOVENOX) injection  40 mg Subcutaneous Q24H   OZH:YQMVHQ chloride, acetaminophen, acetaminophen, alum & mag hydroxide-simeth, diphenhydrAMINE, hydrALAZINE, ondansetron (ZOFRAN) IV, ondansetron, oxyCODONE, sodium chloride, zolpidem, [DISCONTINUED] hydrALAZINE, [DISCONTINUED]  HYDROmorphone (DILAUDID) injection, [DISCONTINUED] oxyCODONE  Assessment/Plan: Likely cluster headache variant.  Recommendations: 1. Taper prednisone by 10 mg per day. 2. Trial of Periactin 2 mg 3 times a day. 3. Outpatient neurology followup appointment following discharge from acute care admission. 4. Oxycodone for pain as needed. 5. No objection from a neurological standpoint to discharge at this point.  C.R. Roseanne Reno, MD [facility] [phone]  10/31/2012  8:45 AM

## 2012-10-31 NOTE — Discharge Summary (Signed)
Physician Discharge Summary  Jaikob Borgwardt ZOX:[id number] DOB: [birth year] DOA: 10/29/2012  PCP: Yisroel Ramming, MD  Admit date: 10/29/2012 Discharge date: 10/31/2012  Time spent: 35 minutes  Recommendations for Outpatient Follow-up:  1. Pt diagnosed with Medication Overuse headache as well as variant cluster headache.  Given prednisone taper on discharge 2. Please adjust pain medication as needed.  Discharge Diagnoses:  Principal Problem:  *Hypertensive urgency Active Problems:  TOBACCO ABUSE  SEIZURE DISORDER  Headache  Migraine variant   Discharge Condition: stable  Diet recommendation: low salt diet  Filed Weights   10/30/12 0243  Weight: 100.517 kg (221 lb 9.6 oz)    History of present illness:  From original HPI: Richard [name] is a 41 y.o. male with a history of hypertension and Seizures who presents tot he ED with complaints of a severe constant 10/10 headache which has been worsening over the past 4 days. The headache was worse this evening. He denies fevers or chills or nausea or vomiting. He reports having bad headaches for several years and state that at times he has to be placed on oxygen, which make me thing=k that he has cluster headaches, but he does not know if he had been diagnosed as having cluster headaches. He re[ortas that his blood pressures also become very elevated when he has a bad headache. And in the Ed he was found to have a blood pressure of 190/124 initially. He was administered IV labetalol, IV Hydralazine and had a short course of a nicardipine drip which decreased his blood pressure down to 149/62. He was referred for medical admission.   Hospital Course:  1. Hypertensive urgency - Patient reports taking goody powders (NSAID) daily for the last month or more. This is most likely contributing to the headaches (moh) as well as the elevated blood pressures.  - At this point better controlled on cardura, lisinopril, and metoprolol (will increase  metoprolol on discharge given his elevated blood pressures) - Will continue to monitor closely. EKG shows no marked ST elevations or depressions  - Troponin negative  - Patient to avoid goody powders or any NSAIDs  2. Headaches  - Likely contributing to # 1  - Patient has been given oxycodone IR without much relief  - Consulted Neurology and neurology suspecting variation cluster type headache as underlying headache. - CT of head showed no acute intracranial abnormality  - Place on prednisone 50 po and will plan on tapering medication as indicated below by Neurology. - Neurology recommended the following: Recommendations:  1. Taper prednisone by 10 mg per day.  2. Trial of Periactin 2 mg 3 times a day.  3. Outpatient neurology followup appointment following discharge from acute care admission.  4. Oxycodone for pain as needed.  5. No objection from a neurological standpoint to discharge at this point.   3. Seizure d/o  - Pt on Phenytoin  - Phenytoin has headache listed as side effect. Could be contributing to # 2  - Neurology consulted today 10/30/12   4. Tobacco abuse  - will order nicotine patch  - currently not on any nicotine replacement and could be contributing to headache as well as withdrawal symptom.   5. Leukocytosis - Patient non toxic. - Most likely 2ary to steroid administration.  - Patient afebrile while in house, denies any cough, SOB, dysuria, blurred vision, photophobia   Procedures:  CT of head  Consultations:  Neurology: Noel Christmas  Discharge Exam: Filed Vitals:   10/30/12 2100 10/31/12 0455 10/31/12 0700  10/31/12 0814  BP:  178/100 154/104 154/98  Pulse: 90 62  71  Temp: 98.4 F (36.9 C) 98 F (36.7 C)  98.2 F (36.8 C)  TempSrc:    Oral  Resp: 14 16  18   Height:      Weight:      SpO2: 93% 97%  96%    General: Pt in NAD, Sitting up smiling Cardiovascular: RRR, No MRG Respiratory: CTA BL, no wheezes  Discharge  Instructions  Discharge Orders    Future Orders Please Complete By Expires   Diet - low sodium heart healthy      Increase activity slowly      Discharge instructions      Comments:   Please avoid goody powders or NSAIDs.  Be sure to follow up with your primary care physician in 1-2 weeks or sooner.   You are to follow up with a neurologist as outpatient. Consider [facility] for further management regarding your cluster headaches.   Call MD for:  temperature >100.4      Call MD for:  severe uncontrolled pain      Call MD for:  persistant dizziness or light-headedness          Medication List     As of 10/31/2012  9:48 AM    TAKE these medications         cyproheptadine 4 MG tablet   Commonly known as: PERIACTIN   Take 0.5 tablets (2 mg total) by mouth 3 (three) times daily.      doxazosin 2 MG tablet   Commonly known as: CARDURA   Take 1 tablet (2 mg total) by mouth daily.      lisinopril 40 MG tablet   Commonly known as: PRINIVIL,ZESTRIL   Take 40 mg by mouth daily.      metoprolol succinate 50 MG 24 hr tablet   Commonly known as: TOPROL-XL   Take 2 tablets (100 mg total) by mouth daily. Take with or immediately following a meal.      oxyCODONE 5 MG immediate release tablet   Commonly known as: Oxy IR/ROXICODONE   Take 1 tablet (5 mg total) by mouth every 4 (four) hours as needed for pain.      phenytoin 50 MG tablet   Commonly known as: DILANTIN   Chew 50 mg by mouth 2 (two) times daily.      predniSONE 10 MG tablet   Commonly known as: DELTASONE   Please take 4 tablets (40mg ) orally today (10/31/12), then take 3 tablets (30 mg) orally on the next day, then take 2 tablets (20mg ) orally the following day, then take 1 tablet (10mg ) orally the following day.      ranitidine 150 MG capsule   Commonly known as: ZANTAC   Take 1 capsule (150 mg total) by mouth daily.          The results of significant diagnostics from this hospitalization (including  imaging, microbiology, ancillary and laboratory) are listed below for reference.    Significant Diagnostic Studies: Dg Chest 2 View  10/29/2012  *RADIOLOGY REPORT*  Clinical Data: Dizziness.  Left-sided chest pain.  CHEST - 2 VIEW  Comparison: 09/24/2012 and 07/28/2008  Findings: Mild pulmonary vascular prominence without pulmonary edema.  Slight increased interstitial markings unchanged from most recent exam.  Etiology indeterminate.  No segmental consolidation.  Mildly tortuous aorta.  Heart size top normal in size.  IMPRESSION:  Mild pulmonary vascular prominence without pulmonary edema.  Slight increased  interstitial markings unchanged from most recent exam.  Etiology indeterminate.  No segmental consolidation.  No pneumothorax.  Mildly tortuous aorta.  Heart size top normal in size.   Original Report Authenticated By: Lacy Duverney, M.D.    Ct Head Wo Contrast  10/29/2012  *RADIOLOGY REPORT*  Clinical Data: Headache with hypertension.  Chest pain.  CT HEAD WITHOUT CONTRAST  Technique:  Contiguous axial images were obtained from the base of the skull through the vertex without contrast.  Comparison: 06/17/2011.  Findings: There is no evidence for acute infarction, intracranial hemorrhage, mass lesion, hydrocephalus, or extra-axial fluid.  Mild atrophy.  Remote left frontal infarct.  Chronic microvascular ischemic change.  Calvarium intact.  No acute sinus or mastoid fluid.  Little change from priors.  IMPRESSION: Chronic changes as described.  No acute intracranial abnormality is observed.   Original Report Authenticated By: Davonna Belling, M.D.     Microbiology: No results found for this or any previous visit (from the past 240 hour(s)).   Labs: Basic Metabolic Panel:  Lab 10/30/12 4782 10/29/12 1833  NA 139 139  K 3.7 4.0  CL 106 106  CO2 24 22  GLUCOSE 146* 99  BUN 12 13  CREATININE 0.85 0.93  CALCIUM 8.7 8.8  MG -- --  PHOS -- --   Liver Function Tests: No results found for this  basename: AST:5,ALT:5,ALKPHOS:5,BILITOT:5,PROT:5,ALBUMIN:5 in the last 168 hours No results found for this basename: LIPASE:5,AMYLASE:5 in the last 168 hours No results found for this basename: AMMONIA:5 in the last 168 hours CBC:  Lab 10/31/12 0838 10/30/12 0533 10/29/12 1833  WBC 14.1* 14.3* 9.4  NEUTROABS 10.0* -- 5.0  HGB 15.4 15.3 15.5  HCT 46.2 45.3 45.4  MCV 93.7 92.8 91.9  PLT 263 250 258   Cardiac Enzymes:  Lab 10/29/12 1901  CKTOTAL --  CKMB --  CKMBINDEX --  TROPONINI <0.30   BNP: BNP (last 3 results) No results found for this basename: PROBNP:3 in the last 8760 hours CBG: No results found for this basename: GLUCAP:5 in the last 168 hours     Signed:  Penny Pia  [facility] 10/31/2012, 9:48 AM

## 2012-11-03 NOTE — Progress Notes (Signed)
Utilization Review Completed.   Lynn Sissel, RN, BSN Nurse Case Manager  [phone]  

## 2012-12-08 ENCOUNTER — Emergency Department (HOSPITAL_COMMUNITY): Payer: Medicare Other

## 2012-12-08 ENCOUNTER — Encounter (HOSPITAL_COMMUNITY): Payer: Self-pay | Admitting: *Deleted

## 2012-12-08 ENCOUNTER — Emergency Department (HOSPITAL_COMMUNITY)
Admission: EM | Admit: 2012-12-08 | Discharge: 2012-12-08 | Disposition: A | Discharge status: Home / Self Care | Payer: Medicare Other | Attending: Emergency Medicine | Admitting: Emergency Medicine

## 2012-12-08 DIAGNOSIS — R0789 Other chest pain: Secondary | ICD-10-CM | POA: Insufficient documentation

## 2012-12-08 DIAGNOSIS — F17200 Nicotine dependence, unspecified, uncomplicated: Secondary | ICD-10-CM | POA: Insufficient documentation

## 2012-12-08 DIAGNOSIS — R059 Cough, unspecified: Secondary | ICD-10-CM | POA: Insufficient documentation

## 2012-12-08 DIAGNOSIS — J40 Bronchitis, not specified as acute or chronic: Secondary | ICD-10-CM

## 2012-12-08 DIAGNOSIS — R05 Cough: Secondary | ICD-10-CM | POA: Insufficient documentation

## 2012-12-08 DIAGNOSIS — I10 Essential (primary) hypertension: Secondary | ICD-10-CM | POA: Insufficient documentation

## 2012-12-08 DIAGNOSIS — G40909 Epilepsy, unspecified, not intractable, without status epilepticus: Secondary | ICD-10-CM | POA: Insufficient documentation

## 2012-12-08 DIAGNOSIS — Z79899 Other long term (current) drug therapy: Secondary | ICD-10-CM | POA: Insufficient documentation

## 2012-12-08 MED ORDER — AZITHROMYCIN 250 MG PO TABS: 250.0000 mg | ORAL_TABLET | Freq: Every day | ORAL | Status: DC

## 2012-12-08 MED ORDER — BENZONATATE 100 MG PO CAPS: 100.0000 mg | ORAL_CAPSULE | Freq: Three times a day (TID) | ORAL | Status: DC

## 2012-12-08 MED ORDER — IBUPROFEN 600 MG PO TABS: 600.0000 mg | ORAL_TABLET | Freq: Four times a day (QID) | ORAL | Status: DC | PRN

## 2012-12-08 NOTE — ED Provider Notes (Signed)
History  This chart was scribed for Richard Racer, MD by Bennett Scrape, ED Scribe. This patient was seen in room TR04C/TR04C and the patient's care was started at 3:59 PM.   CSN: [id number]  Arrival date & time 12/08/12  1456   First MD Initiated Contact with Patient 12/08/12 1559      Chief Complaint  Patient presents with  . Sore Throat     Patient is a 41 y.o. male presenting with pharyngitis. The history is provided by the patient. No language interpreter was used.  Sore Throat This is a new problem. The current episode started more than 1 week ago. The problem occurs constantly. The problem has not changed since onset.Pertinent negatives include no shortness of breath. The symptoms are aggravated by swallowing and coughing. Nothing relieves the symptoms. He has tried nothing for the symptoms.    Richard [name] is a 41 y.o. male who presents to the Emergency Department complaining of approximately one month of lower neck and upper chest pain described as soreness with associated non-productive cough. The sore throat is worse with swallowing and coughing and the cough is worse at night. He states that he has been using cough drops with no improvement. He reports an admission for PNA last month. He denies fever, chills, nausea and emesis as associated symptoms. He has a h/o HTN and seizures and is a current everyday smoker but denies alcohol use.  Past Medical History  Diagnosis Date  . Seizures   . Hypertension     History reviewed. No pertinent past surgical history.  Family History  Problem Relation Age of Onset  . Hypertension Sister   . Diabetes Mellitus II Sister     History  Substance Use Topics  . Smoking status: Current Every Day Smoker -- 0.5 packs/day    Types: Cigarettes  . Smokeless tobacco: Not on file  . Alcohol Use: No      Review of Systems  HENT: Positive for sore throat. Negative for neck pain.   Respiratory: Positive for cough. Negative for  shortness of breath.   Gastrointestinal: Negative for nausea and vomiting.  All other systems reviewed and are negative.    Allergies  Dilaudid  Home Medications   Current Outpatient Rx  Name  Route  Sig  Dispense  Refill  . CYPROHEPTADINE HCL 4 MG PO TABS   Oral   Take 0.5 tablets (2 mg total) by mouth 3 (three) times daily.   30 tablet   0   . DOXAZOSIN MESYLATE 2 MG PO TABS   Oral   Take 1 tablet (2 mg total) by mouth daily.   30 tablet   0   . GUAIFENESIN 100 MG/5ML PO SOLN   Oral   Take 100 mg by mouth every 4 (four) hours as needed. For cough         . LISINOPRIL 40 MG PO TABS   Oral   Take 40 mg by mouth daily.         Marland Kitchen METOPROLOL SUCCINATE ER 50 MG PO TB24   Oral   Take 2 tablets (100 mg total) by mouth daily. Take with or immediately following a meal.   30 tablet   0   . OXYCODONE HCL 5 MG PO TABS   Oral   Take 1 tablet (5 mg total) by mouth every 4 (four) hours as needed for pain.   20 tablet   0   . PHENYTOIN 50 MG PO CHEW  Oral   Chew 50 mg by mouth 2 (two) times daily.         Marland Kitchen RANITIDINE HCL 150 MG PO TABS   Oral   Take 150 mg by mouth 2 (two) times daily.         . AZITHROMYCIN 250 MG PO TABS   Oral   Take 1 tablet (250 mg total) by mouth daily. Take first 2 tablets together, then 1 every day until finished.   6 tablet   0   . BENZONATATE 100 MG PO CAPS   Oral   Take 1 capsule (100 mg total) by mouth every 8 (eight) hours.   21 capsule   0   . IBUPROFEN 600 MG PO TABS   Oral   Take 1 tablet (600 mg total) by mouth every 6 (six) hours as needed for pain.   30 tablet   0     Triage Vitals: BP 167/120  Pulse 100  Temp 98.8 F (37.1 C) (Oral)  Resp 18  SpO2 93%  Physical Exam  Nursing note and vitals reviewed. Constitutional: He is oriented to person, place, and time. He appears well-developed and well-nourished. No distress.  HENT:  Head: Normocephalic and atraumatic.  Mouth/Throat: Oropharynx is clear and  moist.  Eyes: Conjunctivae normal and EOM are normal.  Neck: Neck supple. No tracheal deviation present.  Cardiovascular: Normal rate and regular rhythm.   Pulmonary/Chest: Effort normal and breath sounds normal. No respiratory distress.  Musculoskeletal: Normal range of motion.  Neurological: He is alert and oriented to person, place, and time.  Skin: Skin is warm and dry.  Psychiatric: He has a normal mood and affect. His behavior is normal.    ED Course  Procedures (including critical care time)  DIAGNOSTIC STUDIES: Oxygen Saturation is 93% on room air, adequate by my interpretation.    COORDINATION OF CARE: 4:46 PM- Discussed treatment plan which includes CXR with pt at bedside and pt agreed to plan.  5:25 PM- Advised pt of radiology results. Discussed discharge plan which includes tessalon, Zithromax and ibuprofen with pt and pt agreed to plan. Also advised pt to follow up and pt agreed.  5:30 PM Prescribed 100 mg tessalon capsules, 250 mg Zithromax tablets and 600 mg ibuprofen tablets  Labs Reviewed - No data to display Dg Chest 2 View  12/08/2012  *RADIOLOGY REPORT*  Clinical Data: 41 year old male with chest pain, shortness of breath and cough.  CHEST - 2 VIEW  Comparison: 10/29/2012.  Findings: The cardiomediastinal silhouette is unremarkable. Mild peribronchial thickening is again noted. There is no evidence of focal airspace disease, pulmonary edema, suspicious pulmonary nodule/mass, pleural effusion, or pneumothorax. No acute bony abnormalities are identified.  IMPRESSION: No evidence of acute cardiopulmonary disease.  Mild chronic peribronchial thickening / bronchitis.   Original Report Authenticated By: Harmon Pier, M.D.      1. Bronchitis       MDM  I personally performed the services described in this documentation, which was scribed in my presence. The recorded information has been reviewed and is accurate.     Richard Racer, MD 12/08/12 1945

## 2012-12-08 NOTE — ED Notes (Signed)
Called x1

## 2012-12-08 NOTE — ED Notes (Signed)
The pt states he has had a sorethroat and a bad attitude since dec 1st.  He has been coughing at night and cannot sleep

## 2012-12-08 NOTE — ED Notes (Signed)
Pt states he has had a sore throat since November and it's making him mad and get a bad attitude. Pt c/o cough, sore throat and SOB. Pt states "I can't even smoke my cigarettes or eat because it hurt."

## 2012-12-25 ENCOUNTER — Encounter (HOSPITAL_COMMUNITY): Payer: Self-pay | Admitting: Emergency Medicine

## 2012-12-25 ENCOUNTER — Emergency Department (HOSPITAL_COMMUNITY): Payer: Medicare Other

## 2012-12-25 ENCOUNTER — Emergency Department (HOSPITAL_COMMUNITY)
Admission: EM | Admit: 2012-12-25 | Discharge: 2012-12-25 | Disposition: A | Discharge status: Home / Self Care | Payer: Medicare Other | Attending: Emergency Medicine | Admitting: Emergency Medicine

## 2012-12-25 DIAGNOSIS — I10 Essential (primary) hypertension: Secondary | ICD-10-CM | POA: Insufficient documentation

## 2012-12-25 DIAGNOSIS — F17200 Nicotine dependence, unspecified, uncomplicated: Secondary | ICD-10-CM | POA: Insufficient documentation

## 2012-12-25 DIAGNOSIS — F1010 Alcohol abuse, uncomplicated: Secondary | ICD-10-CM | POA: Insufficient documentation

## 2012-12-25 DIAGNOSIS — F10929 Alcohol use, unspecified with intoxication, unspecified: Secondary | ICD-10-CM

## 2012-12-25 DIAGNOSIS — Z79899 Other long term (current) drug therapy: Secondary | ICD-10-CM | POA: Insufficient documentation

## 2012-12-25 DIAGNOSIS — Z8669 Personal history of other diseases of the nervous system and sense organs: Secondary | ICD-10-CM | POA: Insufficient documentation

## 2012-12-25 LAB — POCT I-STAT, CHEM 8
BUN: 7 mg/dL (ref 6–23)
Creatinine, Ser: 1.2 mg/dL (ref 0.50–1.35)
Hemoglobin: 16 g/dL (ref 13.0–17.0)
Potassium: 3.6 mEq/L (ref 3.5–5.1)
Sodium: 146 mEq/L — ABNORMAL HIGH (ref 135–145)

## 2012-12-25 LAB — RAPID URINE DRUG SCREEN, HOSP PERFORMED: Amphetamines: NOT DETECTED

## 2012-12-25 LAB — CBC
MCH: 31.6 pg (ref 26.0–34.0)
MCHC: 34.8 g/dL (ref 30.0–36.0)
MCV: 90.8 fL (ref 78.0–100.0)
Platelets: 321 10*3/uL (ref 150–400)
RDW: 14.1 % (ref 11.5–15.5)

## 2012-12-25 MED ORDER — SODIUM CHLORIDE 0.9 % IV SOLN
1000.0000 mg | Freq: Once | INTRAVENOUS | Status: AC
  Administered 2012-12-25: 1000 mg via INTRAVENOUS
  Filled 2012-12-25: qty 20

## 2012-12-25 MED ORDER — LORAZEPAM 1 MG PO TABS
1.0000 mg | ORAL_TABLET | Freq: Once | ORAL | Status: AC
  Administered 2012-12-25: 1 mg via ORAL
  Filled 2012-12-25: qty 1

## 2012-12-25 MED ORDER — ONDANSETRON HCL 4 MG/2ML IJ SOLN
4.0000 mg | Freq: Once | INTRAMUSCULAR | Status: AC
  Administered 2012-12-25: 4 mg via INTRAVENOUS
  Filled 2012-12-25: qty 2

## 2012-12-25 MED ORDER — SODIUM CHLORIDE 0.9 % IV SOLN
INTRAVENOUS | Status: DC
  Administered 2012-12-25: 01:00:00 via INTRAVENOUS

## 2012-12-25 MED ORDER — ACETAMINOPHEN 325 MG PO TABS
650.0000 mg | ORAL_TABLET | Freq: Once | ORAL | Status: AC
  Administered 2012-12-25: 650 mg via ORAL
  Filled 2012-12-25: qty 2

## 2012-12-25 NOTE — ED Provider Notes (Addendum)
History     CSN: [id number]  Arrival date & time 12/25/12  0001   First MD Initiated Contact with Patient 12/25/12 0002      Chief Complaint  Patient presents with  . Seizures    (Consider location/radiation/quality/duration/timing/severity/associated sxs/prior treatment) HPI HX per PT. Drinking ETOH tonight, got into an argument with a family member and police were called. Police called EMS for PT vomiting and period of unresponsiveness with seizure history - PT amnestic to events believes he may have had a seizure.  He reports taking dilantin as prescribed. EMS report that PT was initially argumentative on scene but became cooperative with vomiting resolved. No witnessed Sz activity. PT denies any trauma, tongue biting, incontinence or CP/ SOB.  He c/o mild HA. No weakness or numbness.  Past Medical History  Diagnosis Date  . Seizures   . Hypertension     No past surgical history on file.  Family History  Problem Relation Age of Onset  . Hypertension Sister   . Diabetes Mellitus II Sister     History  Substance Use Topics  . Smoking status: Current Every Day Smoker -- 0.5 packs/day    Types: Cigarettes  . Smokeless tobacco: Not on file  . Alcohol Use: Yes     Comment: drinks heavily.      Review of Systems  Constitutional: Negative for fever and chills.  HENT: Negative for neck pain and neck stiffness.   Eyes: Negative for pain.  Respiratory: Negative for shortness of breath.   Gastrointestinal: Negative for abdominal pain.  Genitourinary: Negative for dysuria.  Musculoskeletal: Negative for back pain.  Skin: Negative for rash.  Neurological: Positive for headaches.  All other systems reviewed and are negative.    Allergies  Dilaudid  Home Medications   Current Outpatient Rx  Name  Route  Sig  Dispense  Refill  . AZITHROMYCIN 250 MG PO TABS   Oral   Take 1 tablet (250 mg total) by mouth daily. Take first 2 tablets together, then 1 every day until  finished.   6 tablet   0   . BENZONATATE 100 MG PO CAPS   Oral   Take 1 capsule (100 mg total) by mouth every 8 (eight) hours.   21 capsule   0   . CYPROHEPTADINE HCL 4 MG PO TABS   Oral   Take 0.5 tablets (2 mg total) by mouth 3 (three) times daily.   30 tablet   0   . DOXAZOSIN MESYLATE 2 MG PO TABS   Oral   Take 1 tablet (2 mg total) by mouth daily.   30 tablet   0   . GUAIFENESIN 100 MG/5ML PO SOLN   Oral   Take 100 mg by mouth every 4 (four) hours as needed. For cough         . IBUPROFEN 600 MG PO TABS   Oral   Take 1 tablet (600 mg total) by mouth every 6 (six) hours as needed for pain.   30 tablet   0   . LISINOPRIL 40 MG PO TABS   Oral   Take 40 mg by mouth daily.         Marland Kitchen METOPROLOL SUCCINATE ER 50 MG PO TB24   Oral   Take 2 tablets (100 mg total) by mouth daily. Take with or immediately following a meal.   30 tablet   0   . OXYCODONE HCL 5 MG PO TABS   Oral  Take 1 tablet (5 mg total) by mouth every 4 (four) hours as needed for pain.   20 tablet   0   . PHENYTOIN 50 MG PO CHEW   Oral   Chew 50 mg by mouth 2 (two) times daily.         Marland Kitchen RANITIDINE HCL 150 MG PO TABS   Oral   Take 150 mg by mouth 2 (two) times daily.           BP 171/119  Pulse 99  Temp 98.5 F (36.9 C) (Oral)  Resp 13  SpO2 91%  Physical Exam  Constitutional: He is oriented to person, place, and time. He appears well-developed and well-nourished.  HENT:  Head: Normocephalic and atraumatic.  Mouth/Throat: Oropharynx is clear and moist.       No tongue trauma  Eyes: Conjunctivae normal and EOM are normal. Pupils are equal, round, and reactive to light.  Neck: Full passive range of motion without pain. Neck supple. No thyromegaly present.       No meningismus  Cardiovascular: Normal rate, regular rhythm, S1 normal, S2 normal and intact distal pulses.   Pulmonary/Chest: Effort normal and breath sounds normal.  Abdominal: Soft. Bowel sounds are normal. There is  no tenderness. There is no CVA tenderness.  Musculoskeletal: Normal range of motion.  Neurological: He is alert and oriented to person, place, and time. He has normal strength and normal reflexes. No cranial nerve deficit or sensory deficit. He displays a negative Romberg sign. GCS eye subscore is 4. GCS verbal subscore is 5. GCS motor subscore is 6.       Normal Gait  Skin: Skin is warm and dry. No rash noted. No cyanosis. Nails show no clubbing.  Psychiatric: He has a normal mood and affect. His speech is normal and behavior is normal.    ED Course  Procedures (including critical care time)  Results for orders placed during the hospital encounter of 12/25/12  PHENYTOIN LEVEL, TOTAL      Component Value Range   Phenytoin Lvl <2.5 (*) 10.0 - 20.0 ug/mL  ETHANOL      Component Value Range   Alcohol, Ethyl (B) 123 (*) 0 - 11 mg/dL  CBC      Component Value Range   WBC 9.9  4.0 - 10.5 K/uL   RBC 4.78  4.22 - 5.81 MIL/uL   Hemoglobin 15.1  13.0 - 17.0 g/dL   HCT 13.2  44.0 - 10.2 %   MCV 90.8  78.0 - 100.0 fL   MCH 31.6  26.0 - 34.0 pg   MCHC 34.8  30.0 - 36.0 g/dL   RDW 72.5  36.6 - 44.0 %   Platelets 321  150 - 400 K/uL  URINE RAPID DRUG SCREEN (HOSP PERFORMED)      Component Value Range   Opiates NONE DETECTED  NONE DETECTED   Cocaine NONE DETECTED  NONE DETECTED   Benzodiazepines NONE DETECTED  NONE DETECTED   Amphetamines NONE DETECTED  NONE DETECTED   Tetrahydrocannabinol NONE DETECTED  NONE DETECTED   Barbiturates NONE DETECTED  NONE DETECTED  POCT I-STAT, CHEM 8      Component Value Range   Sodium 146 (*) 135 - 145 mEq/L   Potassium 3.6  3.5 - 5.1 mEq/L   Chloride 109  96 - 112 mEq/L   BUN 7  6 - 23 mg/dL   Creatinine, Ser 3.47  0.50 - 1.35 mg/dL   Glucose, Bld 93  70 -  99 mg/dL   Calcium, Ion 7.82  9.56 - 1.23 mmol/L   TCO2 22  0 - 100 mmol/L   Hemoglobin 16.0  13.0 - 17.0 g/dL   HCT 21.3  08.6 - 57.8 %   Dg Chest 2 View  12/25/2012  *RADIOLOGY REPORT*   Clinical Data: Seizure.  Altered mental status.  CHEST - 2 VIEW  Comparison: 12/08/2012  Findings: The heart is borderline in size.  Mild peribronchial thickening.  No confluent opacities.  No effusions.  No acute bony abnormality.  IMPRESSION: Mild bronchitic changes.  Borderline heart size.   Original Report Authenticated By: Charlett Nose, M.D.      Date: 12/25/2012  Rate: 94  Rhythm: normal sinus rhythm  QRS Axis: normal  Intervals: normal  ST/T Wave abnormalities: nonspecific ST changes  Conduction Disutrbances:none  Narrative Interpretation: diffuse STE unchanged from previous ECG  Old EKG Reviewed: unchanged   Ativan. Zofran. Dilantin.  3:55 AM ambulating without distress. No vomiting or seizure activity in the emergency department. Patient agrees to take his Dilantin as prescribed. He is requesting to be discharged home. No indication for admission at this time MDM  Sz with ETOH use and medicine noncompliance. Evaluated with ECG, CXR and labs as above.   IV Dilantin load provided  Vital signs and nursing notes reviewed. Serial assessments and no neuro deficits.         Sunnie Nielsen, MD 12/25/12 4696  Sunnie Nielsen, MD 01/18/13 [phone]

## 2012-12-25 NOTE — ED Notes (Signed)
Pt states that he is having chest pain, rated 10. Has a history of back pain which shoots to his legs. Pt says that he cannot move his legs.

## 2012-12-25 NOTE — ED Notes (Signed)
Patient transported to X-ray 

## 2012-12-25 NOTE — ED Notes (Signed)
EMS called out to house for vomiting blood. Pt. Has been drinking heavily today. Fire department found him unresponsive. EMS found. Pt.  to be AxO X2 and diaphoretic. He was initially combative to medics and PD had to be called out.

## 2013-03-16 ENCOUNTER — Ambulatory Visit (HOSPITAL_COMMUNITY)
Admission: RE | Admit: 2013-03-16 | Discharge: 2013-03-16 | Disposition: A | Discharge status: Home / Self Care | Payer: Medicare Other | Source: Ambulatory Visit | Attending: Internal Medicine | Admitting: Internal Medicine

## 2013-03-16 ENCOUNTER — Encounter (HOSPITAL_COMMUNITY): Payer: Self-pay | Admitting: Emergency Medicine

## 2013-03-16 ENCOUNTER — Emergency Department (HOSPITAL_COMMUNITY)
Admission: EM | Admit: 2013-03-16 | Discharge: 2013-03-16 | Disposition: A | Discharge status: Home / Self Care | Payer: Medicare Other | Attending: Emergency Medicine | Admitting: Emergency Medicine

## 2013-03-16 ENCOUNTER — Emergency Department (HOSPITAL_COMMUNITY)
Admission: EM | Admit: 2013-03-16 | Discharge: 2013-03-16 | Discharge status: Against Medical Advice | Payer: Medicare Other | Attending: Emergency Medicine | Admitting: Emergency Medicine

## 2013-03-16 ENCOUNTER — Other Ambulatory Visit (HOSPITAL_COMMUNITY): Payer: Self-pay | Admitting: Internal Medicine

## 2013-03-16 DIAGNOSIS — R51 Headache: Secondary | ICD-10-CM | POA: Insufficient documentation

## 2013-03-16 DIAGNOSIS — I10 Essential (primary) hypertension: Secondary | ICD-10-CM | POA: Insufficient documentation

## 2013-03-16 DIAGNOSIS — M549 Dorsalgia, unspecified: Secondary | ICD-10-CM | POA: Insufficient documentation

## 2013-03-16 DIAGNOSIS — Z79899 Other long term (current) drug therapy: Secondary | ICD-10-CM | POA: Insufficient documentation

## 2013-03-16 DIAGNOSIS — F17200 Nicotine dependence, unspecified, uncomplicated: Secondary | ICD-10-CM | POA: Insufficient documentation

## 2013-03-16 DIAGNOSIS — F1410 Cocaine abuse, uncomplicated: Secondary | ICD-10-CM | POA: Insufficient documentation

## 2013-03-16 DIAGNOSIS — R52 Pain, unspecified: Secondary | ICD-10-CM

## 2013-03-16 DIAGNOSIS — Z008 Encounter for other general examination: Secondary | ICD-10-CM | POA: Insufficient documentation

## 2013-03-16 DIAGNOSIS — F1010 Alcohol abuse, uncomplicated: Secondary | ICD-10-CM

## 2013-03-16 DIAGNOSIS — Z87891 Personal history of nicotine dependence: Secondary | ICD-10-CM | POA: Insufficient documentation

## 2013-03-16 DIAGNOSIS — G40909 Epilepsy, unspecified, not intractable, without status epilepticus: Secondary | ICD-10-CM | POA: Insufficient documentation

## 2013-03-16 LAB — CBC
HCT: 46.7 % (ref 39.0–52.0)
Hemoglobin: 16 g/dL (ref 13.0–17.0)
MCH: 30.7 pg (ref 26.0–34.0)
MCHC: 34.3 g/dL (ref 30.0–36.0)
MCV: 89.5 fL (ref 78.0–100.0)
Platelets: 274 K/uL (ref 150–400)
RBC: 5.22 MIL/uL (ref 4.22–5.81)
RDW: 14.9 % (ref 11.5–15.5)
WBC: 10 K/uL (ref 4.0–10.5)

## 2013-03-16 LAB — COMPREHENSIVE METABOLIC PANEL
ALT: 13 U/L (ref 0–53)
Alkaline Phosphatase: 80 U/L (ref 39–117)
CO2: 24 mEq/L (ref 19–32)
Calcium: 8.9 mg/dL (ref 8.4–10.5)
GFR calc Af Amer: >90 mL/min (ref >90)
GFR calc non Af Amer: 78 mL/min — ABNORMAL LOW (ref >90)
Glucose, Bld: 93 mg/dL (ref 70–99)
Sodium: 139 mEq/L (ref 135–145)
Total Bilirubin: 0.4 mg/dL (ref 0.3–1.2)

## 2013-03-16 LAB — COMPREHENSIVE METABOLIC PANEL WITH GFR
AST: 18 U/L (ref 0–37)
Albumin: 3.7 g/dL (ref 3.5–5.2)
BUN: 11 mg/dL (ref 6–23)
Chloride: 105 meq/L (ref 96–112)
Creatinine, Ser: 1.14 mg/dL (ref 0.50–1.35)
Potassium: 4.2 meq/L (ref 3.5–5.1)
Total Protein: 7.4 g/dL (ref 6.0–8.3)

## 2013-03-16 LAB — ETHANOL: Alcohol, Ethyl (B): <11 mg/dL (ref 0–11)

## 2013-03-16 LAB — RAPID URINE DRUG SCREEN, HOSP PERFORMED
Amphetamines: NOT DETECTED
Barbiturates: NOT DETECTED
Benzodiazepines: NOT DETECTED
Cocaine: POSITIVE — AB
Opiates: NOT DETECTED
Tetrahydrocannabinol: NOT DETECTED

## 2013-03-16 MED ORDER — NICOTINE 21 MG/24HR TD PT24
21.0000 mg | MEDICATED_PATCH | Freq: Every day | TRANSDERMAL | Status: DC
  Administered 2013-03-16: 21 mg via TRANSDERMAL
  Filled 2013-03-16: qty 1

## 2013-03-16 MED ORDER — AMLODIPINE BESYLATE 10 MG PO TABS
10.0000 mg | ORAL_TABLET | Freq: Every day | ORAL | Status: DC
  Administered 2013-03-16: 10 mg via ORAL
  Filled 2013-03-16: qty 1

## 2013-03-16 MED ORDER — METOPROLOL SUCCINATE ER 50 MG PO TB24
50.0000 mg | ORAL_TABLET | Freq: Two times a day (BID) | ORAL | Status: DC
  Administered 2013-03-16: 50 mg via ORAL
  Filled 2013-03-16: qty 1

## 2013-03-16 MED ORDER — LORAZEPAM 1 MG PO TABS: 1.0000 mg | ORAL_TABLET | Freq: Three times a day (TID) | ORAL | Status: DC | PRN

## 2013-03-16 MED ORDER — HYDROCHLOROTHIAZIDE 25 MG PO TABS: 25.0000 mg | ORAL_TABLET | Freq: Every day | ORAL | Status: DC

## 2013-03-16 MED ORDER — METOPROLOL TARTRATE 25 MG PO TABS
50.0000 mg | ORAL_TABLET | Freq: Once | ORAL | Status: AC
  Administered 2013-03-16: 50 mg via ORAL
  Filled 2013-03-16: qty 2

## 2013-03-16 MED ORDER — FAMOTIDINE 20 MG PO TABS
20.0000 mg | ORAL_TABLET | Freq: Every day | ORAL | Status: DC
  Administered 2013-03-16: 20 mg via ORAL
  Filled 2013-03-16: qty 1

## 2013-03-16 MED ORDER — LISINOPRIL 40 MG PO TABS
40.0000 mg | ORAL_TABLET | Freq: Every day | ORAL | Status: DC
  Administered 2013-03-16: 40 mg via ORAL
  Filled 2013-03-16: qty 1

## 2013-03-16 MED ORDER — CYPROHEPTADINE HCL 4 MG PO TABS
2.0000 mg | ORAL_TABLET | Freq: Three times a day (TID) | ORAL | Status: DC
  Administered 2013-03-16: 2 mg via ORAL
  Filled 2013-03-16: qty 1

## 2013-03-16 MED ORDER — DOXAZOSIN MESYLATE 2 MG PO TABS
2.0000 mg | ORAL_TABLET | Freq: Every day | ORAL | Status: DC
  Administered 2013-03-16: 2 mg via ORAL
  Filled 2013-03-16: qty 1

## 2013-03-16 MED ORDER — ONDANSETRON HCL 4 MG PO TABS: 4.0000 mg | ORAL_TABLET | Freq: Three times a day (TID) | ORAL | Status: DC | PRN

## 2013-03-16 MED ORDER — ACETAMINOPHEN 325 MG PO TABS: 650.0000 mg | ORAL_TABLET | ORAL | Status: DC | PRN

## 2013-03-16 MED ORDER — ZOLPIDEM TARTRATE 5 MG PO TABS: 5.0000 mg | ORAL_TABLET | Freq: Every evening | ORAL | Status: DC | PRN

## 2013-03-16 MED ORDER — IBUPROFEN 600 MG PO TABS: 600.0000 mg | ORAL_TABLET | Freq: Three times a day (TID) | ORAL | Status: DC | PRN

## 2013-03-16 MED ORDER — PHENYTOIN 50 MG PO CHEW
50.0000 mg | CHEWABLE_TABLET | Freq: Two times a day (BID) | ORAL | Status: DC
  Administered 2013-03-16: 50 mg via ORAL
  Filled 2013-03-16: qty 1

## 2013-03-16 NOTE — ED Notes (Signed)
Pt presenting to ed with c/o needing detox from etoh and cocaine. Pt states he had 2 beers earlier today pt states he checked in earlier but he wasn't seen. Pt denies SI/HI

## 2013-03-16 NOTE — ED Notes (Signed)
Pt states sent here from [facility] for medical clearance, pt requesting detox treatments

## 2013-03-16 NOTE — ED Notes (Signed)
Patient discharge to home with written and verbal instructions. No acute distress noted.

## 2013-03-16 NOTE — ED Notes (Signed)
One belongings bag placed in locker 34.

## 2013-03-16 NOTE — ED Provider Notes (Signed)
History    This chart was scribed for non-physician practitioner working with [name]. Oletta Lamas, MD by Sofie Rower, ED Scribe. This patient was seen in room [facility] and the patient's care was started at 4:57PM.   CSN: [id number]  Arrival date & time 03/16/13  1647   First MD Initiated Contact with Patient 03/16/13 1657      Chief Complaint  Patient presents with  . detox     (Consider location/radiation/quality/duration/timing/severity/associated sxs/prior treatment) Patient is a 42 y.o. male presenting with drug problem and alcohol problem. The history is provided by the patient. No language interpreter was used.  Drug Problem This is a chronic problem. The current episode started more than 1 week ago. The problem occurs every several days. The problem has been gradually worsening. Associated symptoms include headaches. Pertinent negatives include no chest pain, no abdominal pain and no shortness of breath. Nothing aggravates the symptoms. Nothing relieves the symptoms. He has tried nothing for the symptoms. The treatment provided no relief.  Alcohol Problem This is a chronic problem. The current episode started more than 1 week ago. The problem occurs daily. The problem has been gradually worsening. Associated symptoms include headaches. Pertinent negatives include no chest pain, no abdominal pain and no shortness of breath. Nothing aggravates the symptoms. Nothing relieves the symptoms. He has tried nothing for the symptoms. The treatment provided no relief.    Richard [name] is a 42 y.o. male , with a hx of hypertension and seizures, who presents to the Emergency Department requesting detox from cocaine and alcohol.  Associated symptoms include headache. The pt reports he is visiting [facility] this afternoon, 03/16/13, seeking detox from cocaine and alcohol. The last time the pt used cocaine was two months ago, however, he does inform he drinks alcohol on a daily basis. On a typical day, the pt  informs he may drink a 12 pack of beer. The last time the pt attempted detox was one month ago, where he was attending outpatient holistic's classes. The pt informs he has ceased attending classes and returned to drinking alcohol at the present point and time. Furthermore, the pt informs he has experienced seizures in the past, when attempting to detox from alcohol. Importantly, the pt informs he has already consumed two beers today, 03/16/13, one beer at 8:00AM and his second beer at 9:00AM.   The pt denies weakness, numbness, and fever.   The pt is a current everyday smoker (1.5 packs/day).  PCP is Dr. Philipp Deputy.    Past Medical History  Diagnosis Date  . Seizures   . Hypertension     History reviewed. No pertinent past surgical history.  Family History  Problem Relation Age of Onset  . Hypertension Sister   . Diabetes Mellitus II Sister     History  Substance Use Topics  . Smoking status: Current Every Day Smoker -- 0.50 packs/day    Types: Cigarettes  . Smokeless tobacco: Not on file  . Alcohol Use: Yes     Comment: drinks heavily.      Review of Systems  Respiratory: Negative for shortness of breath.   Cardiovascular: Negative for chest pain.  Gastrointestinal: Negative for abdominal pain.  Neurological: Positive for headaches.  Psychiatric/Behavioral: Negative for suicidal ideas.  All other systems reviewed and are negative.    Allergies  Dilaudid  Home Medications   Current Outpatient Rx  Name  Route  Sig  Dispense  Refill  . amLODipine (NORVASC) 10 MG tablet   Oral  Take 10 mg by mouth daily.         . cyproheptadine (PERIACTIN) 4 MG tablet   Oral   Take 0.5 tablets (2 mg total) by mouth 3 (three) times daily.   30 tablet   0   . doxazosin (CARDURA) 2 MG tablet   Oral   Take 1 tablet (2 mg total) by mouth daily.   30 tablet   0   . hydrochlorothiazide (HYDRODIURIL) 25 MG tablet   Oral   Take 25 mg by mouth daily.         Marland Kitchen lisinopril  (PRINIVIL,ZESTRIL) 40 MG tablet   Oral   Take 40 mg by mouth daily.         . metoprolol succinate (TOPROL-XL) 50 MG 24 hr tablet   Oral   Take 50 mg by mouth 2 (two) times daily. Take with or immediately following a meal.         . phenytoin (DILANTIN) 50 MG tablet   Oral   Chew 50 mg by mouth 2 (two) times daily.         . ranitidine (ZANTAC) 150 MG tablet   Oral   Take 150 mg by mouth daily.            BP 173/116  Pulse 86  Temp(Src) 98.5 F (36.9 C) (Oral)  Resp 20  SpO2 94%  Physical Exam  Nursing note and vitals reviewed. Constitutional: He is oriented to person, place, and time. He appears well-developed and well-nourished. No distress.  HENT:  Head: Normocephalic and atraumatic.  Right Ear: External ear normal.  Left Ear: External ear normal.  Nose: Nose normal.  Eyes: Conjunctivae are normal. Pupils are equal, round, and reactive to light.  Horizontal Nystagmus bilaterally.   Neck: Normal range of motion. No tracheal deviation present.  Cardiovascular: Normal rate, regular rhythm and normal heart sounds.   Pulmonary/Chest: Effort normal. No stridor. He has wheezes.  Diffuse wheezes on inspiration and prolonged expiration.   Abdominal: Soft. He exhibits no distension. There is no tenderness.  Musculoskeletal: Normal range of motion.  Neurological: He is alert and oriented to person, place, and time. Coordination normal.  Skin: Skin is warm and dry. He is not diaphoretic.  Psychiatric: He has a normal mood and affect. His behavior is normal.    ED Course  Procedures (including critical care time)  DIAGNOSTIC STUDIES: Oxygen Saturation is 94% on room air, normal by my interpretation.    COORDINATION OF CARE:    5:05 PM- Treatment plan discussed with patient. Pt agrees with treatment.     Results for orders placed during the hospital encounter of 03/16/13  CBC      Result Value Range   WBC 10.0  4.0 - 10.5 K/uL   RBC 5.22  4.22 - 5.81  MIL/uL   Hemoglobin 16.0  13.0 - 17.0 g/dL   HCT 16.1  09.6 - 04.5 %   MCV 89.5  78.0 - 100.0 fL   MCH 30.7  26.0 - 34.0 pg   MCHC 34.3  30.0 - 36.0 g/dL   RDW 40.9  81.1 - 91.4 %   Platelets 274  150 - 400 K/uL  COMPREHENSIVE METABOLIC PANEL      Result Value Range   Sodium 139  135 - 145 mEq/L   Potassium 4.2  3.5 - 5.1 mEq/L   Chloride 105  96 - 112 mEq/L   CO2 24  19 - 32 mEq/L   Glucose,  Bld 93  70 - 99 mg/dL   BUN 11  6 - 23 mg/dL   Creatinine, Ser 2.95  0.50 - 1.35 mg/dL   Calcium 8.9  8.4 - 62.1 mg/dL   Total Protein 7.4  6.0 - 8.3 g/dL   Albumin 3.7  3.5 - 5.2 g/dL   AST 18  0 - 37 U/L   ALT 13  0 - 53 U/L   Alkaline Phosphatase 80  39 - 117 U/L   Total Bilirubin 0.4  0.3 - 1.2 mg/dL   GFR calc non Af Amer 78 (*) >90 mL/min   GFR calc Af Amer >90  >90 mL/min  ETHANOL      Result Value Range   Alcohol, Ethyl (B) <11  0 - 11 mg/dL  URINE RAPID DRUG SCREEN (HOSP PERFORMED)      Result Value Range   Opiates NONE DETECTED  NONE DETECTED   Cocaine POSITIVE (*) NONE DETECTED   Benzodiazepines NONE DETECTED  NONE DETECTED   Amphetamines NONE DETECTED  NONE DETECTED   Tetrahydrocannabinol NONE DETECTED  NONE DETECTED   Barbiturates NONE DETECTED  NONE DETECTED   Dg Chest 2 View  03/16/2013  *RADIOLOGY REPORT*  Clinical Data: Mid back pain for 2 months.  History of multiple falls due to the pain which has been making an hard to walk.  No known cardiopulmonary abnormality.  Hypertension.  39 pack year history of smoking  CHEST - 2 VIEW  Comparison: 12/25/2012  Findings: Low lung volumes are present and taking this into consideration heart size is upper limits of normal.  A normal mediastinal contour is seen.  The lung fields demonstrate a mild diffuse increase in interstitial markings suggesting some degree of underlying bronchitic change which would correlate with the longstanding history of smoking.  No focal infiltrates or signs of congestive failure are seen.  No pleural fluid  or significant central peribronchial cuffing is noted.  Bony structures appear intact.  IMPRESSION: Stable bronchitic change with no new focal or acute abnormality identified   Original Report Authenticated By: Rhodia Albright, M.D.         1. Cocaine abuse   2. Alcohol abuse       MDM  Patient presents today for EtOH detox. His last drink was this am. He has attempted outpatient detox multiple times and failed. He most recent attempt was 1 month ago. He states that he feels good while he is in the meetings, however as soon as he leaves he reverts back and drinks beer. He has a history of DTs. No SI/HI. Also has history of cocaine abuse. He has not used cocaine in months. He smokes a pack and a half a day. Hypertensive, but medically stable. Act team consulted.     I personally performed the services described in this documentation, which was scribed in my presence. The recorded information has been reviewed and is accurate.   Mora Bellman, PA-C 03/17/13 [id number]

## 2013-03-16 NOTE — ED Notes (Signed)
Patient reports that he drinks "2 to 3" 24 oz beers daily eventually leads to him drinking 6 to 12 pack a day.

## 2013-03-16 NOTE — ED Notes (Signed)
Pt has 1 pt belonging bag at nurses station in triage, wanded by security

## 2013-03-16 NOTE — ED Provider Notes (Signed)
Patient given list of detox places. Not withdrawing. Will d/c home. Recommend going to outpatient detox places.   Richardean Canal, MD 03/16/13 2132

## 2013-03-17 NOTE — ED Provider Notes (Signed)
Medical screening examination/treatment/procedure(s) were performed by non-physician practitioner and as supervising physician I was immediately available for consultation/collaboration.   [name]. Oletta Lamas, MD 03/17/13 2154

## 2013-06-18 ENCOUNTER — Observation Stay (HOSPITAL_COMMUNITY): Payer: PRIVATE HEALTH INSURANCE

## 2013-06-18 ENCOUNTER — Observation Stay (HOSPITAL_COMMUNITY)
Admission: EM | Admit: 2013-06-18 | Discharge: 2013-06-19 | Disposition: A | Discharge status: Home / Self Care | Payer: PRIVATE HEALTH INSURANCE | Attending: Internal Medicine | Admitting: Internal Medicine

## 2013-06-18 ENCOUNTER — Encounter (HOSPITAL_COMMUNITY): Payer: Self-pay | Admitting: Emergency Medicine

## 2013-06-18 DIAGNOSIS — G40909 Epilepsy, unspecified, not intractable, without status epilepticus: Secondary | ICD-10-CM | POA: Insufficient documentation

## 2013-06-18 DIAGNOSIS — R569 Unspecified convulsions: Secondary | ICD-10-CM | POA: Diagnosis present

## 2013-06-18 DIAGNOSIS — R1110 Vomiting, unspecified: Secondary | ICD-10-CM | POA: Diagnosis present

## 2013-06-18 DIAGNOSIS — I10 Essential (primary) hypertension: Secondary | ICD-10-CM | POA: Insufficient documentation

## 2013-06-18 DIAGNOSIS — Z79899 Other long term (current) drug therapy: Secondary | ICD-10-CM | POA: Insufficient documentation

## 2013-06-18 DIAGNOSIS — I160 Hypertensive urgency: Secondary | ICD-10-CM | POA: Diagnosis present

## 2013-06-18 DIAGNOSIS — R079 Chest pain, unspecified: Secondary | ICD-10-CM | POA: Diagnosis present

## 2013-06-18 DIAGNOSIS — R1013 Epigastric pain: Principal | ICD-10-CM | POA: Insufficient documentation

## 2013-06-18 DIAGNOSIS — F17200 Nicotine dependence, unspecified, uncomplicated: Secondary | ICD-10-CM | POA: Diagnosis present

## 2013-06-18 DIAGNOSIS — G43809 Other migraine, not intractable, without status migrainosus: Secondary | ICD-10-CM

## 2013-06-18 DIAGNOSIS — R51 Headache: Secondary | ICD-10-CM

## 2013-06-18 DIAGNOSIS — J321 Chronic frontal sinusitis: Secondary | ICD-10-CM

## 2013-06-18 DIAGNOSIS — K920 Hematemesis: Secondary | ICD-10-CM | POA: Insufficient documentation

## 2013-06-18 LAB — COMPREHENSIVE METABOLIC PANEL
Alkaline Phosphatase: 81 U/L (ref 39–117)
BUN: 10 mg/dL (ref 6–23)
CO2: 27 mEq/L (ref 19–32)
GFR calc Af Amer: 89 mL/min — ABNORMAL LOW (ref >90)
GFR calc non Af Amer: 77 mL/min — ABNORMAL LOW (ref >90)
Glucose, Bld: 99 mg/dL (ref 70–99)
Potassium: 3.8 mEq/L (ref 3.5–5.1)
Total Bilirubin: 0.9 mg/dL (ref 0.3–1.2)
Total Protein: 6.7 g/dL (ref 6.0–8.3)

## 2013-06-18 LAB — CBC WITH DIFFERENTIAL/PLATELET
Basophils Relative: 0 % (ref 0–1)
Eosinophils Absolute: 0 10*3/uL (ref 0.0–0.7)
HCT: 46.8 % (ref 39.0–52.0)
Hemoglobin: 16.1 g/dL (ref 13.0–17.0)
MCH: 31.3 pg (ref 26.0–34.0)
MCHC: 34.4 g/dL (ref 30.0–36.0)
Monocytes Absolute: 0.9 10*3/uL (ref 0.1–1.0)
Monocytes Relative: 10 % (ref 3–12)
Neutro Abs: 6.4 10*3/uL (ref 1.7–7.7)

## 2013-06-18 LAB — RAPID URINE DRUG SCREEN, HOSP PERFORMED
Barbiturates: NOT DETECTED
Benzodiazepines: NOT DETECTED
Cocaine: POSITIVE — AB
Opiates: NOT DETECTED
Tetrahydrocannabinol: NOT DETECTED

## 2013-06-18 LAB — URINALYSIS, ROUTINE W REFLEX MICROSCOPIC
Bilirubin Urine: NEGATIVE
Hgb urine dipstick: NEGATIVE
Specific Gravity, Urine: 1.019 (ref 1.005–1.030)
Urobilinogen, UA: 0.2 mg/dL (ref 0.0–1.0)

## 2013-06-18 LAB — TROPONIN I: Troponin I: <0.3 ng/mL (ref <0.30)

## 2013-06-18 LAB — ABO/RH: ABO/RH(D): A POS

## 2013-06-18 LAB — POCT I-STAT TROPONIN I: Troponin i, poc: 0 ng/mL (ref 0.00–0.08)

## 2013-06-18 MED ORDER — AMLODIPINE BESYLATE 10 MG PO TABS
10.0000 mg | ORAL_TABLET | Freq: Every day | ORAL | Status: DC
Start: 2013-06-18 — End: 2013-06-19
  Administered 2013-06-18 – 2013-06-19 (×2): 10 mg via ORAL
  Filled 2013-06-18 (×2): qty 1

## 2013-06-18 MED ORDER — ALBUTEROL SULFATE (5 MG/ML) 0.5% IN NEBU: 2.5000 mg | INHALATION_SOLUTION | RESPIRATORY_TRACT | Status: DC | PRN

## 2013-06-18 MED ORDER — PANTOPRAZOLE SODIUM 40 MG IV SOLR
40.0000 mg | Freq: Two times a day (BID) | INTRAVENOUS | Status: DC
  Administered 2013-06-18 – 2013-06-19 (×2): 40 mg via INTRAVENOUS
  Filled 2013-06-18 (×3): qty 40

## 2013-06-18 MED ORDER — ACETAMINOPHEN 325 MG PO TABS
650.0000 mg | ORAL_TABLET | Freq: Four times a day (QID) | ORAL | Status: DC | PRN
Start: 2013-06-18 — End: 2013-06-19

## 2013-06-18 MED ORDER — ONDANSETRON HCL 4 MG/2ML IJ SOLN: 4.0000 mg | Freq: Four times a day (QID) | INTRAMUSCULAR | Status: DC | PRN

## 2013-06-18 MED ORDER — SODIUM CHLORIDE 0.9 % IV SOLN
Freq: Once | INTRAVENOUS | Status: AC
  Administered 2013-06-18: 13:00:00 via INTRAVENOUS

## 2013-06-18 MED ORDER — ALUM & MAG HYDROXIDE-SIMETH 200-200-20 MG/5ML PO SUSP: 30.0000 mL | Freq: Four times a day (QID) | ORAL | Status: DC | PRN

## 2013-06-18 MED ORDER — ONDANSETRON HCL 4 MG PO TABS: 4.0000 mg | ORAL_TABLET | Freq: Four times a day (QID) | ORAL | Status: DC | PRN

## 2013-06-18 MED ORDER — SODIUM CHLORIDE 0.9 % IV SOLN: INTRAVENOUS | Status: DC

## 2013-06-18 MED ORDER — SODIUM CHLORIDE 0.9 % IV BOLUS (SEPSIS): 1000.0000 mL | Freq: Once | INTRAVENOUS | Status: DC

## 2013-06-18 MED ORDER — ACETAMINOPHEN 650 MG RE SUPP: 650.0000 mg | Freq: Four times a day (QID) | RECTAL | Status: DC | PRN

## 2013-06-18 MED ORDER — HYDRALAZINE HCL 20 MG/ML IJ SOLN: 10.0000 mg | Freq: Four times a day (QID) | INTRAMUSCULAR | Status: DC | PRN

## 2013-06-18 MED ORDER — PHENYTOIN 50 MG PO CHEW
50.0000 mg | CHEWABLE_TABLET | Freq: Two times a day (BID) | ORAL | Status: DC
  Administered 2013-06-18: 50 mg via ORAL
  Filled 2013-06-18 (×3): qty 1

## 2013-06-18 MED ORDER — ONDANSETRON HCL 4 MG/2ML IJ SOLN: 4.0000 mg | Freq: Once | INTRAMUSCULAR | Status: DC

## 2013-06-18 MED ORDER — LABETALOL HCL 5 MG/ML IV SOLN
20.0000 mg | INTRAVENOUS | Status: DC | PRN
Start: 2013-06-18 — End: 2013-06-19
  Filled 2013-06-18 (×2): qty 4

## 2013-06-18 MED ORDER — SODIUM CHLORIDE 0.45 % IV SOLN
INTRAVENOUS | Status: DC
  Administered 2013-06-18: 20:00:00 via INTRAVENOUS

## 2013-06-18 MED ORDER — SODIUM CHLORIDE 0.9 % IJ SOLN
3.0000 mL | Freq: Two times a day (BID) | INTRAMUSCULAR | Status: DC
  Administered 2013-06-18 – 2013-06-19 (×2): 3 mL via INTRAVENOUS

## 2013-06-18 MED ORDER — METOPROLOL SUCCINATE ER 100 MG PO TB24
100.0000 mg | ORAL_TABLET | Freq: Every day | ORAL | Status: DC
  Filled 2013-06-18: qty 1

## 2013-06-18 MED ORDER — TRIAMTERENE-HCTZ 37.5-25 MG PO TABS
1.0000 | ORAL_TABLET | Freq: Every day | ORAL | Status: DC
  Administered 2013-06-18 – 2013-06-19 (×2): 1 via ORAL
  Filled 2013-06-18 (×2): qty 1

## 2013-06-18 MED ORDER — LISINOPRIL 40 MG PO TABS
40.0000 mg | ORAL_TABLET | Freq: Every day | ORAL | Status: DC
  Administered 2013-06-18 – 2013-06-19 (×2): 40 mg via ORAL
  Filled 2013-06-18 (×2): qty 1

## 2013-06-18 NOTE — ED Notes (Addendum)
BIB [facility]. N/v/d starting yesterday. Vomiting dark blood per PT. Hypertensive for EMS. NSR. Abdominal soreness from emesis. NO melena. Denies CP today, but does endorse CP from yesterday

## 2013-06-18 NOTE — ED Provider Notes (Signed)
Medical screening examination/treatment/procedure(s) were performed by non-physician practitioner and as supervising physician I was immediately available for consultation/collaboration.   Hurman Horn, MD 06/18/13 2159

## 2013-06-18 NOTE — ED Notes (Signed)
Report attempted 

## 2013-06-18 NOTE — ED Provider Notes (Signed)
History    CSN: [id number] Arrival date & time 06/18/13  1150  First MD Initiated Contact with Patient 06/18/13 1154     Chief Complaint  Patient presents with  . Abdominal Pain  . Emesis   (Consider location/radiation/quality/duration/timing/severity/associated sxs/prior Treatment) HPI Comments: Patient is a 42 y/o male with a hx of HTN who presents for hematemasis with onset this morning. Patient admits to only one episode of emesis that "was all blood". Denies aggravating or alleviating factors of symptoms. He endorses associated R sided, nonradiating chest pain which followed that was sharp in nature. Patient states that he also had low back pain today, but that this is chronic for him. He admits to drinking 3-4 beers a day, but did not drink today. He also states he takes "something" for his blood pressure, but is not sure what he takes. Denies relationship with cardiologist.   Patient is a 42 y.o. male presenting with abdominal pain and vomiting. The history is provided by the patient. No language interpreter was used.  Abdominal Pain Associated symptoms include abdominal pain, chest pain (R sided), nausea and vomiting (bloody, nonbilious). Pertinent negatives include no fever.  Emesis Associated symptoms: abdominal pain   Associated symptoms: no diarrhea    Past Medical History  Diagnosis Date  . Hypertension   . Seizures     last episode 03/2013   Past Surgical History  Procedure Laterality Date  . Leg surgery     Family History  Problem Relation Age of Onset  . Hypertension Sister   . Diabetes Mellitus II Sister    History  Substance Use Topics  . Smoking status: Current Every Day Smoker -- 0.50 packs/day    Types: Cigarettes  . Smokeless tobacco: Not on file  . Alcohol Use: Yes     Comment: drinks heavily.    Review of Systems  Constitutional: Negative for fever.  HENT: Negative for trouble swallowing.   Eyes: Negative for visual disturbance.  Respiratory:  Negative for shortness of breath.   Cardiovascular: Positive for chest pain (R sided). Negative for palpitations.  Gastrointestinal: Positive for nausea, vomiting (bloody, nonbilious) and abdominal pain. Negative for diarrhea and blood in stool.  Genitourinary: Negative for dysuria and hematuria.  Neurological: Negative for dizziness and light-headedness.  All other systems reviewed and are negative.    Allergies  Dilaudid  Home Medications   Current Outpatient Rx  Name  Route  Sig  Dispense  Refill  . amLODipine (NORVASC) 10 MG tablet   Oral   Take 10 mg by mouth daily.         . hydrochlorothiazide (HYDRODIURIL) 25 MG tablet   Oral   Take 25 mg by mouth daily.         Marland Kitchen lisinopril (PRINIVIL,ZESTRIL) 40 MG tablet   Oral   Take 40 mg by mouth daily.         . metoprolol succinate (TOPROL-XL) 50 MG 24 hr tablet   Oral   Take 50 mg by mouth 2 (two) times daily. Take with or immediately following a meal.         . phenytoin (DILANTIN) 50 MG tablet   Oral   Chew 50 mg by mouth 2 (two) times daily.         . ranitidine (ZANTAC) 150 MG tablet   Oral   Take 150 mg by mouth daily.           BP 170/113  Pulse 76  Resp 17  SpO2 92%  Physical Exam  Nursing note and vitals reviewed. Constitutional: He is oriented to person, place, and time. He appears well-developed and well-nourished. No distress.  HENT:  Head: Normocephalic and atraumatic.  Mouth/Throat: Oropharynx is clear and moist. No oropharyngeal exudate.  Eyes: Conjunctivae and EOM are normal. Pupils are equal, round, and reactive to light.  Neck: Normal range of motion.  Cardiovascular: Normal rate, regular rhythm and normal heart sounds.   Pulmonary/Chest: Effort normal and breath sounds normal. No respiratory distress. He has no wheezes. He has no rales.  Abdominal: Soft. He exhibits distension (mild). There is no tenderness. There is no rebound and no guarding.  Musculoskeletal: Normal range of  motion.  Neurological: He is alert and oriented to person, place, and time.  Skin: Skin is warm and dry. No rash noted. He is not diaphoretic. No erythema. No pallor.  Psychiatric: He has a normal mood and affect. His behavior is normal.    ED Course  Procedures (including critical care time) Labs Reviewed  COMPREHENSIVE METABOLIC PANEL - Abnormal; Notable for the following:    Albumin 3.4 (*)    GFR calc non Af Amer 77 (*)    GFR calc Af Amer 89 (*)    All other components within normal limits  LIPASE, BLOOD  CBC WITH DIFFERENTIAL  URINALYSIS, ROUTINE W REFLEX MICROSCOPIC  URINE RAPID DRUG SCREEN (HOSP PERFORMED)  ETHANOL  POCT I-STAT TROPONIN I  TYPE AND SCREEN    Date: 06/18/2013  Rate: 79  Rhythm: normal sinus rhythm  QRS Axis: normal  Intervals: normal  ST/T Wave abnormalities: nonspecific ST changes  Conduction Disutrbances:none  Narrative Interpretation: NSR with nonspecific ST changes; no STEMI  Old EKG Reviewed: unchanged from 12/25/2012 I have personally reviewed and interpreted this EKG  No results found.   1. Hematemesis   2. Hypertension     MDM  42 year old male with a history of hypertension who presents for gross hematemesis x 1 this morning. Physical exam as above. Patient without leukocytosis or anemia. Liver and kidney function preserved. Patient refusing rectal exam to check for fecal occult blood. Patient hypertensive while in ED with diastolic blood pressure of ~126. Patient does admit to taking his blood pressure medication this morning but has not know what medication it is. Patient asymptomatic for HTN, Troponin 0.00 and EKG unchanged from prior. Will admit for observation for unconfirmed upper GI bleed; ? Endoscopy.  Dr. Jerral Ralph to admit; temp hold orders placed.  Antony Madura, PA-C 06/18/13 1546

## 2013-06-18 NOTE — Progress Notes (Signed)
MEDICATION RELATED CONSULT NOTE - INITIAL   Pharmacy Consult for Phenytoin Indication: hx seizure  Allergies  Allergen Reactions  . Dilaudid (Hydromorphone Hcl) Other (See Comments)    Bradycardia and Nausea    Patient Measurements:   Vital Signs: Temp: 98.8 F (37.1 C) (07/06 1841) Temp src: Oral (07/06 1841) BP: 180/135 mmHg (07/06 1841) Pulse Rate: 83 (07/06 1841) Intake/Output from previous day:   Intake/Output from this shift:    Labs:  Recent Labs  06/18/13 1207  WBC 9.1  HGB 16.1  HCT 46.8  PLT 218  CREATININE 1.15  ALBUMIN 3.4*  PROT 6.7  AST 22  ALT 24  ALKPHOS 81  BILITOT 0.9   The CrCl is unknown because both a height and weight (above a minimum accepted value) are required for this calculation.   Microbiology: No results found for this or any previous visit (from the past 720 hour(s)).  Medical History: Past Medical History  Diagnosis Date  . Hypertension   . Seizures     last episode 03/2013   Assessment: Patient is a 42 y.o M on phenytoin PTA for hx seizures.  Patient informed me that he has phenytoin 25mg  tablets at home and he takes 1 tablet in the morning and 1 in the evening.  The smallest strength size for phenytoin is 30mg .  I called [facility] to clarify dose and they do not have him in their system.  [facility] pharmacy is currently closed.      Goal of Therapy:  Phenytoin level = 10-20  Plan:  1) will clarify home phenytoin dose with [facility] pharmacy in AM 2) continue phenytoin 50mg  BID for now as indicated on med rec 3) check phenytoin level with AM labs and adjust dose as needed  Anthony Roland P 06/18/2013,6:47 PM

## 2013-06-18 NOTE — Progress Notes (Signed)
Richard [name] [id number] Admitted to 5W11: 06/18/2013 8:04 PM Attending Provider: Maretta Bees, MD    Richard [name] is a 42 y.o. male patient admitted from ED awake, alert  & orientated  X 3,  Full Code, VSS - Blood pressure 180/135, pulse 83, temperature 98.8 F (37.1 C), temperature source Oral, resp. rate 18, SpO2 92.00%., R/A, re-checked BP manually and was 190/130. Dr. Jerral Ralph notified and stated to give prn medications when available. no c/o shortness of breath, c/o chest pain 5 on scale 0-10 informed to call if get worst, no distress noted. Tele # [phone] placed and pt is currently running:normal sinus rhythm.   IV site WDL:  hand right, condition patent and no redness and left, condition patent and no redness with a transparent dsg that's clean dry and intact.  Allergies:   Allergies  Allergen Reactions  . Dilaudid (Hydromorphone Hcl) Other (See Comments)    Bradycardia and Nausea     Past Medical History  Diagnosis Date  . Hypertension   . Seizures     last episode 03/2013    History:  Started obtaining from the patient.  Pt orientation to unit, room and routine. Information packet given to patient/family and safety video watched.  Admission INP armband ID verified with patient/family, and in place. SR up x 2, fall risk assessment complete with Patient and family verbalizing understanding of risks associated with falls. Pt verbalizes an understanding of how to use the call bell and to call for help before getting out of bed.      Will cont to monitor and assist as needed.  Richard [name], [place], RN 06/18/2013 8:04 PM

## 2013-06-18 NOTE — H&P (Signed)
PATIENT DETAILS Name: Richard [name] Age: 43 y.o. Sex: male Date of Birth: November 03, 1971 Admit Date: 06/18/2013 [id number], Tresa Endo, MD   CHIEF COMPLAINT:   hematemesis -one episode this morning  HPI: Richard [name] is a 42 y.o. male with a Past Medical History of uncontrolled hypertension, polysubstance use, tobacco use who presents today with the above noted complaint. Per patient he was in his usual state of health, yesterday he had a few loose stools and on 2 episodes of vomiting, this morning he had one episode of vomiting that was grossly bloody. He has had no further episodes since then. He also complains of vague upper abdominal pain, he claims he had 2 loose stools yesterday but none today. He also complains of mild right-sided chest pain without any radiation or shortness of breath. Patient was evaluated in the ED, EKGs and initial cardiac enzymes were negative, hemoglobin and hematocrit were within normal range. I was asked to admit this patient for further observation and treatment. Patient during my evaluation, she very comfortable, he denied any active abdominal pain active chest pain during my evaluation. He denied any ongoing shortness of breath. He denies any history of headaches, fever, ongoing vomiting. There is no history of polyuria or polydipsia as well. He claims that he used to use cocaine for a few weeks ago. Claims to only drink alcohol in the weekends.   ALLERGIES:   Allergies  Allergen Reactions  . Dilaudid (Hydromorphone Hcl) Other (See Comments)    Bradycardia and Nausea    PAST MEDICAL HISTORY: Past Medical History  Diagnosis Date  . Hypertension   . Seizures     last episode 03/2013    PAST SURGICAL HISTORY: Past Surgical History  Procedure Laterality Date  . Leg surgery      MEDICATIONS AT HOME: Prior to Admission medications   Medication Sig Start Date End Date Taking? Authorizing Provider  amLODipine (NORVASC) 10 MG tablet Take 10 mg by mouth daily.    Yes Historical Provider, MD  hydrochlorothiazide (HYDRODIURIL) 25 MG tablet Take 25 mg by mouth daily.   Yes Historical Provider, MD  lisinopril (PRINIVIL,ZESTRIL) 40 MG tablet Take 40 mg by mouth daily.   Yes Historical Provider, MD  metoprolol succinate (TOPROL-XL) 50 MG 24 hr tablet Take 50 mg by mouth 2 (two) times daily. Take with or immediately following a meal.   Yes Historical Provider, MD  phenytoin (DILANTIN) 50 MG tablet Chew 50 mg by mouth 2 (two) times daily.   Yes Historical Provider, MD  ranitidine (ZANTAC) 150 MG tablet Take 150 mg by mouth daily.    Yes Historical Provider, MD    FAMILY HISTORY: Family History  Problem Relation Age of Onset  . Hypertension Sister   . Diabetes Mellitus II Sister     SOCIAL HISTORY:  reports that he has been smoking Cigarettes.  He has been smoking about 0.50 packs per day. He does not have any smokeless tobacco history on file. He reports that  drinks alcohol. He reports that he uses illicit drugs (Cocaine).  REVIEW OF SYSTEMS:  Constitutional:   No  weight loss, night sweats,  Fevers, chills, fatigue.  HEENT:    No headaches, Difficulty swallowing,Tooth/dental problems,Sore throat,  No sneezing, itching, ear ache, nasal congestion, post nasal drip,   Cardio-vascular: No  Orthopnea, PND, swelling in lower extremities, anasarca, dizziness, palpitations  GI:  No heartburn, indigestion,  diarrhea, change in  bowel habits, loss of appetite  Resp: No shortness of breath with exertion  or at rest.  No excess mucus, no productive cough, No non-productive cough,  No coughing up of blood.No change in color of mucus.No wheezing.No chest wall deformity  Skin:  no rash or lesions.  GU:  no dysuria, change in color of urine, no urgency or frequency.  No flank pain.  Musculoskeletal: No joint pain or swelling.  No decreased range of motion.    Psych: No change in mood or affect. No depression or anxiety.  No memory loss.   PHYSICAL  EXAM: Blood pressure 171/116, pulse 80, resp. rate 17, SpO2 92.00%.  General appearance :Awake, alert, not in any distress. Speech Clear. Not toxic Looking HEENT: Atraumatic and Normocephalic, pupils equally reactive to light and accomodation Neck: supple, no JVD. No cervical lymphadenopathy.  Chest:Good air entry bilaterally, no added sounds  CVS: S1 S2 regular, no murmurs.  Abdomen: Bowel sounds present, Non tender and not distended with no gaurding, rigidity or rebound. Extremities: B/L Lower Ext shows no edema, both legs are warm to touch Neurology: Awake alert, and oriented X 3, CN II-XII intact, Non focal Skin:No Rash Wounds:N/A  LABS ON ADMISSION:   Recent Labs  06/18/13 1207  NA 139  K 3.8  CL 104  CO2 27  GLUCOSE 99  BUN 10  CREATININE 1.15  CALCIUM 8.7    Recent Labs  06/18/13 1207  AST 22  ALT 24  ALKPHOS 81  BILITOT 0.9  PROT 6.7  ALBUMIN 3.4*    Recent Labs  06/18/13 1207  LIPASE 15    Recent Labs  06/18/13 1207  WBC 9.1  NEUTROABS 6.4  HGB 16.1  HCT 46.8  MCV 91.1  PLT 218   No results found for this basename: CKTOTAL, CKMB, CKMBINDEX, TROPONINI,  in the last 72 hours No results found for this basename: DDIMER,  in the last 72 hours No components found with this basename: POCBNP,    RADIOLOGIC STUDIES ON ADMISSION: Dg Chest 2 View  06/18/2013   *RADIOLOGY REPORT*  Clinical Data: Short of breath, chest pain, hematemesis  CHEST - 2 VIEW  Comparison: 03/16/2013  Findings: Stable bronchitic changes.  No focal consolidation.  No pleural effusion or pneumothorax.  The heart is normal in size.  Visualized osseous structures are within normal limits.  IMPRESSION: Stable bronchitic changes.   Original Report Authenticated By: Charline Bills, M.D.     EKG: Independently reviewed. Normal sinus rhythm with nonspecific ST-T changes. Essentially unchanged from prior.  ASSESSMENT AND PLAN: Present on Admission:  . Vomiting- with one episode of  hematemesis today  - Apparently had one episode of hematemesis this morning, none since. Complains of vague upper abdominal pain as well. Lipase levels are normal. Abdomen is soft and benign on exam.  - For now, since he has stable hemoglobin and hematocrit- admitted overnight for observation. Please on PPIs and gently hydrate. If overt or recurrent hematemesis develops, then we will consult gastroenterology for possible EGD. If no further hematemesis, an H&H remains stable (mild drop expected from IV fluids) - patient will likely need no further workup and can be discharged once blood pressure well-controlled.   . Hypertensive urgency - Continue with current antihypertensive medications, increase Toprol to 100 mg daily, stop HCTZ and will substitute it with Maxzide. Use when necessary hydralazine while inpatient.  - We'll further adjust medications depending upon BP readings.  . Chest pain - This is mostly right-sided, very atypical. However when admitted to telemetry and cycle cardiac enzymes.  . Abdominal pain -  Very minimal and mostly in the upper epigastric area, as noted above, bili is very soft. - Try left PPIs and supportive care - Lipase normal on admission - Check x-ray of the abdomen.  Marland Kitchen SEIZURE DISORDER - Continue Dilantin, we'll have pharmacy dose Dilantin 1 inpatient.   . TOBACCO ABUSE - Counseled regarding importance of tobacco cessation he claims understanding, however it does not seem willing to quit at this present time   Further plan will depend as patient's clinical course evolves and further radiologic and laboratory data become available. Patient will be monitored closely.   DVT Prophylaxis: SCD's  Code Status: Full Code  Total time spent for admission equals 45 minutes.  [facility] Pager [phone]  If 7PM-7AM, please contact night-coverage [email or web address] Password [facility] 06/18/2013, 4:58 PM

## 2013-06-19 DIAGNOSIS — R079 Chest pain, unspecified: Secondary | ICD-10-CM

## 2013-06-19 LAB — COMPREHENSIVE METABOLIC PANEL
Alkaline Phosphatase: 77 U/L (ref 39–117)
BUN: 8 mg/dL (ref 6–23)
CO2: 26 mEq/L (ref 19–32)
Calcium: 8.7 mg/dL (ref 8.4–10.5)
GFR calc Af Amer: >90 mL/min (ref >90)
GFR calc non Af Amer: >90 mL/min (ref >90)
Glucose, Bld: 127 mg/dL — ABNORMAL HIGH (ref 70–99)
Total Protein: 6.5 g/dL (ref 6.0–8.3)

## 2013-06-19 LAB — CBC
Platelets: 216 10*3/uL (ref 150–400)
RBC: 4.96 MIL/uL (ref 4.22–5.81)
RDW: 13.8 % (ref 11.5–15.5)
WBC: 9 10*3/uL (ref 4.0–10.5)

## 2013-06-19 LAB — TROPONIN I: Troponin I: <0.3 ng/mL (ref <0.30)

## 2013-06-19 LAB — PROTIME-INR: Prothrombin Time: 12.6 seconds (ref 11.6–15.2)

## 2013-06-19 MED ORDER — LISINOPRIL 40 MG PO TABS: 40.0000 mg | ORAL_TABLET | Freq: Every day | ORAL | Status: DC

## 2013-06-19 MED ORDER — PHENYTOIN 50 MG PO CHEW
100.0000 mg | CHEWABLE_TABLET | Freq: Two times a day (BID) | ORAL | Status: DC
  Administered 2013-06-19: 100 mg via ORAL
  Filled 2013-06-19 (×2): qty 2

## 2013-06-19 MED ORDER — PHENYTOIN 50 MG PO CHEW: 100.0000 mg | CHEWABLE_TABLET | Freq: Two times a day (BID) | ORAL | Status: DC

## 2013-06-19 MED ORDER — AMLODIPINE BESYLATE 10 MG PO TABS: 10.0000 mg | ORAL_TABLET | Freq: Every day | ORAL | Status: DC

## 2013-06-19 MED ORDER — ALBUTEROL SULFATE HFA 108 (90 BASE) MCG/ACT IN AERS: 2.0000 | INHALATION_SPRAY | Freq: Four times a day (QID) | RESPIRATORY_TRACT | Status: DC | PRN

## 2013-06-19 MED ORDER — TRIAMTERENE-HCTZ 37.5-25 MG PO TABS: 1.0000 | ORAL_TABLET | Freq: Every day | ORAL | Status: DC

## 2013-06-19 NOTE — Progress Notes (Signed)
Cosign for Zadie Cleverly, RN

## 2013-06-19 NOTE — Progress Notes (Signed)
MEDICATION RELATED CONSULT NOTE - FOLLOW UP   Pharmacy Consult:  Dilantin Indication:  History of seizure  Allergies  Allergen Reactions  . Dilaudid (Hydromorphone Hcl) Other (See Comments)    Bradycardia and Nausea    Patient Measurements: Height: 5\' 10"  (177.8 cm) Weight: 210 lb (95.255 kg) IBW/kg (Calculated) : 73  Vital Signs: Temp: 98.8 F (37.1 C) (07/07 0535) Temp src: Oral (07/07 0535) BP: 135/90 mmHg (07/07 0535) Pulse Rate: 98 (07/07 0535) Intake/Output from previous day: 07/06 0701 - 07/07 0700 In: 1186.3 [P.O.:480; I.V.:706.3] Out: 2100 [Urine:2100] Intake/Output from this shift: Total I/O In: 240 [P.O.:240] Out: 800 [Urine:800]  Labs:  Recent Labs  06/18/13 1207 06/19/13 0040 06/19/13 0105  WBC 9.1  --  9.0  HGB 16.1  --  15.7  HCT 46.8  --  44.7  PLT 218  --  216  CREATININE 1.15 1.01  --   ALBUMIN 3.4* 3.2*  --   PROT 6.7 6.5  --   AST 22 18  --   ALT 24 21  --   ALKPHOS 81 77  --   BILITOT 0.9 0.6  --    Estimated Creatinine Clearance: 110.4 ml/min (by C-G formula based on Cr of 1.01).   Microbiology: No results found for this or any previous visit (from the past 720 hour(s)).     Assessment: 41 YOM on phenytoin PTA for hx seizures.  Patient informed me that he has phenytoin 25mg  tablets at home and he takes 1 tablet in the morning and 1 in the evening.  The smallest strength size for phenytoin is 30mg .  [facility] not [facility] their system and [facility] does not fill that medication for him.  He reports being compliant with med and has not missed any dose PTA.  Patient's Dilantin level is sub-therapeutic.  No new seizure reported.  7/7 DPH level < 2.5, albumin 3.2 ==> corrected to 3.4 mcg/mL   Goal of Therapy:  Phenytoin level = 10-20 mcg/mL   Plan:  - Increase Phenytoin to 100mg  PO BID per discussion with MD - Monitor renal fxn, seizure activity - Recommend checking albumin and a Dilantin level in 5-7 days if he is  discharged today     Bryella Diviney D. Laney Potash, PharmD, BCPS Pager:  [phone] 06/19/2013, 9:50 AM

## 2013-06-19 NOTE — Progress Notes (Signed)
Cosign for Bobbie Scott, RN 

## 2013-06-19 NOTE — Discharge Summary (Signed)
Physician Discharge Summary  Richard [name] ZOX:[id number] DOB: 1971-12-10 DOA: 06/18/2013  PCP: Richard Ramming, MD  Admit date: 06/18/2013 Discharge date: 06/19/2013  Time spent: 45 minutes  Recommendations for Outpatient Follow-up:  -BP management.  Off beta blockers due to recurrent cocaine use. -Dilantin dose doubled on 7/7 to 100 mg bid.  Needs dilantin level checked in 1 week. -Patient reports he has a follow up appointment scheduled at the [name] on [facility]9  Discharge Diagnoses:  Principal Problem:   Vomiting Active Problems:   TOBACCO ABUSE   SEIZURE DISORDER   Hypertensive urgency   Chest pain   Discharge Condition: Stable  Diet recommendation:  -Heart-healthy diet low in sodium  Filed Weights   06/18/13 2300  Weight: 95.255 kg (210 lb)    Hospital Course:   Present on Admission:  . Vomiting with reports of hematemesis prior to admission.  - Admitted with complaints of vague upper abdominal pain. Lipase levels are normal. Abdomen is soft and benign on exam. Abdominal x-ray was negative. -Placed on PPIs and gently hydrated. As he improved his diet was advanced to solids. He no longer has any further abdominal pain this morning. Patient was admitted and hemoglobin and hematocrit were monitored closely, hemoglobin this morning remains at 15.7. - Since Hematemesis has not reoccurred and hemoglobin remains stable , at this time no GI consult or further workup was needed. Patient has been asked to abstain from alcohol and cocaine, and followup with his primary care practitioner. He has also been asked to return to the emergency room if he were to have recurrence of hematemesis. -Patient counseled to abstain from Cocaine and Alcohol.  . Hypertensive urgency  -BP well controlled today 135/90 -Antihypertensive medications have been adjusted, Toprol discontinued, stop HCTZ, and start Maxzide, continue amlodipine and lisinopril. -Counseled to abstain from Cocaine use. Risk of  catastrophic and life-threatening CVA, and MI had been relayed to the patient. He claims understanding.  . Chest pain  -This has been mostly right-sided, very atypical, localized without radiation. -He was admitted to telemetry, cardiac enzymes have been monitored, which showed no evidence of an MI or acute process. -Stable and has almost resolved this morning. He is not tachycardic and hypoxic.  Marland Kitchen Abdominal pain  - Very minimal and mostly in the upper epigastric area. Significantly better than on admission, almost resolved - PPIs and supportive care  - Lipase normal on admission  - Abdominal X-Ray showed no evidence of an acute process and no bowel obstruction.  Marland Kitchen SEIZURE DISORDER  - Continue Dilantin -Dosage increased to 100 mg bid per pharmacy.   -Will need a dilantin level in 1 week.  Patient aware.  . Polysubstance Abuse  - Counseled regarding importance of Cocaine and tobacco cessation, and he claims he understands.  Procedures:  None  Discharge Exam: Filed Vitals:   06/18/13 2015 06/18/13 2232 06/18/13 2300 06/19/13 0535  BP: 202/142 153/114 131/89 135/90  Pulse:  104 91 98  Temp:  100.9 F (38.3 C)  98.8 F (37.1 C)  TempSrc:  Oral  Oral  Resp:  18  17  Height:   5\' 10"  (1.778 m)   Weight:   95.255 kg (210 lb)   SpO2:  97%  92%    General: Patient awake, A&Ox3; in no acute distress. Cardiovascular: RRR, with no mgr. Respiratory: lungs clear to auscultation bilaterally; no wheezes, rhonchi, or rales. Abdominal: soft; nontender, nondistended, BS+ Musculoskeletal: AROM of all 4 extremities, no edema noted Skin: no rash,  bruising, or ulcerations.  Discharge Instructions      Discharge Orders   Future Orders Complete By Expires     Diet - low sodium heart healthy  As directed     Discharge instructions  As directed     Comments:      [name] Dilantin level will need to be checked in 1 week as his dose of Dilantin has been doubled.    Increase activity  slowly  As directed         Medication List    STOP taking these medications       hydrochlorothiazide 25 MG tablet  Commonly known as:  HYDRODIURIL     metoprolol succinate 50 MG 24 hr tablet  Commonly known as:  TOPROL-XL      TAKE these medications       albuterol 108 (90 BASE) MCG/ACT inhaler  Commonly known as:  PROVENTIL HFA;VENTOLIN HFA  Inhale 2 puffs into the lungs every 6 (six) hours as needed for wheezing.     amLODipine 10 MG tablet  Commonly known as:  NORVASC  Take 1 tablet (10 mg total) by mouth daily.     lisinopril 40 MG tablet  Commonly known as:  PRINIVIL,ZESTRIL  Take 1 tablet (40 mg total) by mouth daily.     phenytoin 50 MG tablet  Commonly known as:  DILANTIN  Chew 2 tablets (100 mg total) by mouth 2 (two) times daily.     ranitidine 150 MG tablet  Commonly known as:  ZANTAC  Take 150 mg by mouth daily.     triamterene-hydrochlorothiazide 37.5-25 MG per tablet  Commonly known as:  MAXZIDE-25  Take 1 tablet by mouth daily.       Allergies  Allergen Reactions  . Dilaudid (Hydromorphone Hcl) Other (See Comments)    Bradycardia and Nausea   Follow-up Information   Please follow up. (Patient has a appt to be seen on 7/9.  He needs a Dilantin level check in 1 week.)    Contact information:   [name]       The results of significant diagnostics from this hospitalization (including imaging, microbiology, ancillary and laboratory) are listed below for reference.    Significant Diagnostic Studies: Dg Chest 2 View  06/18/2013   *RADIOLOGY REPORT*  Clinical Data: Short of breath, chest pain, hematemesis  CHEST - 2 VIEW  Comparison: 03/16/2013  Findings: Stable bronchitic changes.  No focal consolidation.  No pleural effusion or pneumothorax.  The heart is normal in size.  Visualized osseous structures are within normal limits.  IMPRESSION: Stable bronchitic changes.   Original Report Authenticated By: Charline Bills, M.D.   Dg Abd 2  Views  06/18/2013   *RADIOLOGY REPORT*  Clinical Data: Right-sided abdominal pain, nausea.  ABDOMEN - 2 VIEW  Comparison: CT 05/11/2011  Findings: Nonobstructive bowel gas pattern.  No free air.  No organomegaly or suspicious calcification.  Sclerotic area in the right iliac bone is again noted, unchanged since prior CT, presumably bone island.  IMPRESSION: No evidence of bowel obstruction or free air.   Original Report Authenticated By: Charlett Nose, M.D.    Labs: Basic Metabolic Panel:  Recent Labs Lab 06/18/13 1207 06/19/13 0040  NA 139 138  K 3.8 4.0  CL 104 104  CO2 27 26  GLUCOSE 99 127*  BUN 10 8  CREATININE 1.15 1.01  CALCIUM 8.7 8.7   Liver Function Tests:  Recent Labs Lab 06/18/13 1207 06/19/13 0040  AST  22 18  ALT 24 21  ALKPHOS 81 77  BILITOT 0.9 0.6  PROT 6.7 6.5  ALBUMIN 3.4* 3.2*    Recent Labs Lab 06/18/13 1207  LIPASE 15  CBC:  Recent Labs Lab 06/18/13 1207 06/19/13 0105  WBC 9.1 9.0  NEUTROABS 6.4  --   HGB 16.1 15.7  HCT 46.8 44.7  MCV 91.1 90.1  PLT 218 216   Cardiac Enzymes:  Recent Labs Lab 06/18/13 1937 06/19/13 0040  TROPONINI <0.30 <0.30    Signed:  Tama Gander PA-S Algis Downs, PA-C [facility] 06/19/2013, 10:31 AM  Attending I've seen and examined the patient, I agree with the above assessment and plan. I have reviewed the above documentation and have made the necessary changes. Patient's blood pressure is significantly better this morning. I suspect he may have been noncompliant to medications and continues to abuse cocaine, as a result uncontrolled blood pressure appears. This morning he has tolerated a regular diet, his abdomen is very soft and non-tender. Abdominal x-ray done yesterday was negative for any significant pathology. His right-sided chest pain seems to be is better as well, cardiac enzymes are negative, he does not have any hypoxia or tachycardia as well. Since his chest pain has improved, doubt  any further workup is needed at this point in time. In regards to his one episode of reported hematemesis, this has not reoccurred since hospitalization , hemoglobin and hematocrit remain stable and within normal range. His diet was advanced this morning without any major issues. At this time it is felt, the patient is stable for discharge. He needs to abstain from polysubstance use-namely cocaine. We had a detailed conversations with the patient regarding the effects of cocaine on blood pressure, risk of MI and CVA that can be catastrophic. He claims understanding.  S Ghimire

## 2013-06-19 NOTE — Progress Notes (Signed)
   CARE MANAGEMENT NOTE 06/19/2013  Patient:  EMERALD, GEHRES   Account Number:  [id number]  Date Initiated:  06/19/2013  Documentation initiated by:  [facility]  Subjective/Objective Assessment:   Hypertensive Urgency     Action/Plan:   lives alone, girlfriend assist with him at home   Anticipated DC Date:  06/19/2013   Anticipated DC Plan:  HOME/SELF CARE      DC Planning Services  CM consult      Choice offered to / List presented to:             Status of service:  Completed, signed off Medicare Important Message given?   (If response is "NO", the following Medicare IM given date fields will be blank) Date Medicare IM given:   Date Additional Medicare IM given:    Discharge Disposition:  HOME/SELF CARE  Per UR Regulation:    If discussed at Long Length of Stay Meetings, dates discussed:    Comments:  06/19/2013 1115 NCM spoke to pt and states he goes to [facility]. Has follow up appt on 7/9 with new NP, Dayton Scrape at 2:30 pm. Pt states he will keep appt. Explained Dilantin level is needed next week. Gave permission to fax dc summary to his new provider at [phone]. Contacted [facility] and appt on 7/9 at 2:30 pm. Explained Dilantin level is requested for next week. Isidoro Donning RN CCM Case Mgmt phone [phone]

## 2013-06-19 NOTE — Progress Notes (Signed)
Patient very hungry tonight. Patient had a full liquid dinner as ordered. He consumed 100% of that. He has had two ice creams, an [place] ice, 6 cups of beef broth since 1900 change of shift.

## 2013-06-19 NOTE — Progress Notes (Signed)
Patient discharge teaching given, including activity, diet, follow-up appoints, and medications. Patient verbalized understanding of all discharge instructions. IV access was d/c'd. Vitals are stable. Skin is intact except as charted in most recent assessments. Pt to be escorted out by NT, to be driven home by family. 

## 2013-06-19 NOTE — Progress Notes (Signed)
Utilization review completed. Bane Hagy, RN, BSN. 

## 2013-07-17 ENCOUNTER — Emergency Department (HOSPITAL_COMMUNITY)
Admission: EM | Admit: 2013-07-17 | Discharge: 2013-07-17 | Disposition: A | Discharge status: Home / Self Care | Payer: Medicare Other | Attending: Emergency Medicine | Admitting: Emergency Medicine

## 2013-07-17 ENCOUNTER — Encounter (HOSPITAL_COMMUNITY): Payer: Self-pay | Admitting: *Deleted

## 2013-07-17 DIAGNOSIS — F10920 Alcohol use, unspecified with intoxication, uncomplicated: Secondary | ICD-10-CM

## 2013-07-17 DIAGNOSIS — R799 Abnormal finding of blood chemistry, unspecified: Secondary | ICD-10-CM | POA: Insufficient documentation

## 2013-07-17 DIAGNOSIS — G40909 Epilepsy, unspecified, not intractable, without status epilepticus: Secondary | ICD-10-CM

## 2013-07-17 DIAGNOSIS — H5789 Other specified disorders of eye and adnexa: Secondary | ICD-10-CM | POA: Insufficient documentation

## 2013-07-17 DIAGNOSIS — F1010 Alcohol abuse, uncomplicated: Secondary | ICD-10-CM | POA: Insufficient documentation

## 2013-07-17 DIAGNOSIS — I10 Essential (primary) hypertension: Secondary | ICD-10-CM | POA: Insufficient documentation

## 2013-07-17 DIAGNOSIS — Z79899 Other long term (current) drug therapy: Secondary | ICD-10-CM | POA: Insufficient documentation

## 2013-07-17 DIAGNOSIS — Z5181 Encounter for therapeutic drug level monitoring: Secondary | ICD-10-CM

## 2013-07-17 DIAGNOSIS — F17200 Nicotine dependence, unspecified, uncomplicated: Secondary | ICD-10-CM | POA: Insufficient documentation

## 2013-07-17 LAB — URINALYSIS, ROUTINE W REFLEX MICROSCOPIC
Bilirubin Urine: NEGATIVE
Glucose, UA: NEGATIVE mg/dL
Hgb urine dipstick: NEGATIVE
Ketones, ur: NEGATIVE mg/dL
Nitrite: NEGATIVE
Specific Gravity, Urine: 1.009 (ref 1.005–1.030)
pH: 5 (ref 5.0–8.0)

## 2013-07-17 LAB — POCT I-STAT, CHEM 8
BUN: 6 mg/dL (ref 6–23)
Creatinine, Ser: 1.2 mg/dL (ref 0.50–1.35)
HCT: 52 % (ref 39.0–52.0)
Potassium: 4.1 mEq/L (ref 3.5–5.1)
Sodium: 143 mEq/L (ref 135–145)
TCO2: 20 mmol/L (ref 0–100)

## 2013-07-17 LAB — PHENYTOIN LEVEL, TOTAL: Phenytoin Lvl: <2.5 ug/mL — ABNORMAL LOW (ref 10.0–20.0)

## 2013-07-17 MED ORDER — SODIUM CHLORIDE 0.9 % IV SOLN
1000.0000 mg | Freq: Once | INTRAVENOUS | Status: AC
  Administered 2013-07-17: 1000 mg via INTRAVENOUS
  Filled 2013-07-17: qty 20

## 2013-07-17 MED ORDER — PHENYTOIN 50 MG PO CHEW: 100.0000 mg | CHEWABLE_TABLET | Freq: Two times a day (BID) | ORAL | Status: DC

## 2013-07-17 NOTE — ED Provider Notes (Signed)
CSN: [id number]     Arrival date & time 07/17/13  0103 History     First MD Initiated Contact with Patient 07/17/13 0118     Chief Complaint  Patient presents with  . Seizures   HPI Patient presents to the emergency room because of possible seizure activity. The patient is somewhat vague as to what was going on this evening. He states he remembers getting angry and thinks that may have been a factor. EMS was called apparently by bystanders because of possible seizure activity. The patient does have a history of seizure disorder. He takes Dilantin daily. He states he has not missed any of his doses. EMS reported the patient was initially confused. Currently he is calm and states he is hungry and is cold. He did not eat anything today. Patient denies any chest pain, headache, numbness or weakness. He is able to walk now without any difficulty. He denies any recent alcohol use or drug use. Past Medical History  Diagnosis Date  . Hypertension   . Seizures     last episode 03/2013   Past Surgical History  Procedure Laterality Date  . Leg surgery     Family History  Problem Relation Age of Onset  . Diabetes Mellitus II Sister    History  Substance Use Topics  . Smoking status: Current Every Day Smoker -- 1.00 packs/day for 26 years    Types: Cigarettes  . Smokeless tobacco: Not on file  . Alcohol Use: 2.4 oz/week    4 Cans of beer per week     Comment: drinks heavily.    Review of Systems  All other systems reviewed and are negative.    Allergies  Dilaudid  Home Medications   Current Outpatient Rx  Name  Route  Sig  Dispense  Refill  . albuterol (PROVENTIL HFA;VENTOLIN HFA) 108 (90 BASE) MCG/ACT inhaler   Inhalation   Inhale 2 puffs into the lungs every 6 (six) hours as needed for wheezing.   1 Inhaler   2   . amLODipine (NORVASC) 10 MG tablet   Oral   Take 1 tablet (10 mg total) by mouth daily.   30 tablet   6   . lisinopril (PRINIVIL,ZESTRIL) 40 MG tablet   Oral  Take 1 tablet (40 mg total) by mouth daily.   30 tablet   6   . phenytoin (DILANTIN) 50 MG tablet   Oral   Chew 2 tablets (100 mg total) by mouth 2 (two) times daily.   120 tablet   6   . ranitidine (ZANTAC) 150 MG tablet   Oral   Take 150 mg by mouth daily.          Marland Kitchen triamterene-hydrochlorothiazide (MAXZIDE-25) 37.5-25 MG per tablet   Oral   Take 1 tablet by mouth daily.   30 tablet   6    BP 135/96  Pulse 86  Temp(Src) 97.6 F (36.4 C) (Oral)  Resp 20  SpO2 97% Physical Exam  Nursing note and vitals reviewed. Constitutional: He appears well-developed and well-nourished. No distress.  HENT:  Head: Normocephalic and atraumatic.  Right Ear: External ear normal.  Left Ear: External ear normal.  Eyes: Conjunctivae are normal. Right eye exhibits no discharge. Left eye exhibits no discharge. No scleral icterus.  Conjunctiva injected  Neck: Neck supple. No tracheal deviation present.  Cardiovascular: Normal rate, regular rhythm and intact distal pulses.   Pulmonary/Chest: Effort normal and breath sounds normal. No stridor. No respiratory distress. He  has no wheezes. He has no rales.  Abdominal: Soft. Bowel sounds are normal. He exhibits no distension. There is no tenderness. There is no rebound and no guarding.  Musculoskeletal: He exhibits no edema and no tenderness.  Neurological: He is alert. He has normal strength. No sensory deficit. Cranial nerve deficit:  no gross defecits noted. He exhibits normal muscle tone. He displays no seizure activity. Coordination normal.  Patient is able to ambulate without difficulty  Skin: Skin is warm and dry. No rash noted.  Psychiatric: He has a normal mood and affect.    ED Course   Procedures (including critical care time)  Labs Reviewed  ETHANOL - Abnormal; Notable for the following:    Alcohol, Ethyl (B) 152 (*)    All other components within normal limits  PHENYTOIN LEVEL, TOTAL - Abnormal; Notable for the following:     Phenytoin Lvl <2.5 (*)    All other components within normal limits  POCT I-STAT, CHEM 8 - Abnormal; Notable for the following:    Hemoglobin 17.7 (*)    All other components within normal limits  URINALYSIS, ROUTINE W REFLEX MICROSCOPIC   No results found. 1. Alcohol intoxication, uncomplicated   2. Seizure disorder   3. Subtherapeutic phenytoin level     MDM  Pt without further seizure activity.  He was given a dilantin load.  Alcohol and subtherapeutic dilantin level likely contributed to the probable seizure this evening.  Celene Kras, MD 07/17/13 [phone]

## 2013-07-17 NOTE — ED Notes (Signed)
Bed:WA09<BR> Expected date:<BR> Expected time:<BR> Means of arrival:<BR> Comments:<BR> EMS

## 2013-07-17 NOTE — ED Notes (Signed)
ems- called by [facility] for HTN . EMS - called for seizure activity. Tense episode/ non responsive to  Sternal rub. Then start attempt IV- then came around. Very confused- when EMS arrived. Alert but confused, Combative when attempting to take patient to hospital , [facility] came to site was not as agitated- but remained confused. Started to improved on the way to hospital  More receptive to male EMT.  Currently alert and oriented x4. ? etoh use VS-  160/112, 88HR, 20RR, CBG 102. Unable to obtain pulse ox reading.

## 2013-07-30 ENCOUNTER — Encounter (HOSPITAL_COMMUNITY): Payer: Self-pay | Admitting: Emergency Medicine

## 2013-07-30 ENCOUNTER — Emergency Department (HOSPITAL_COMMUNITY)
Admission: EM | Admit: 2013-07-30 | Discharge: 2013-07-31 | Disposition: A | Discharge status: Home / Self Care | Payer: Medicare Other | Attending: Emergency Medicine | Admitting: Emergency Medicine

## 2013-07-30 DIAGNOSIS — I10 Essential (primary) hypertension: Secondary | ICD-10-CM | POA: Insufficient documentation

## 2013-07-30 DIAGNOSIS — R55 Syncope and collapse: Secondary | ICD-10-CM

## 2013-07-30 DIAGNOSIS — Z8669 Personal history of other diseases of the nervous system and sense organs: Secondary | ICD-10-CM | POA: Insufficient documentation

## 2013-07-30 DIAGNOSIS — F17200 Nicotine dependence, unspecified, uncomplicated: Secondary | ICD-10-CM | POA: Insufficient documentation

## 2013-07-30 DIAGNOSIS — R42 Dizziness and giddiness: Secondary | ICD-10-CM | POA: Insufficient documentation

## 2013-07-30 LAB — CBC WITH DIFFERENTIAL/PLATELET
Basophils Absolute: 0 10*3/uL (ref 0.0–0.1)
Basophils Relative: 0 % (ref 0–1)
Eosinophils Absolute: 0 10*3/uL (ref 0.0–0.7)
Eosinophils Relative: 0 % (ref 0–5)
Lymphocytes Relative: 34 % (ref 12–46)
MCHC: 34.9 g/dL (ref 30.0–36.0)
MCV: 91.4 fL (ref 78.0–100.0)
Platelets: 278 10*3/uL (ref 150–400)
RDW: 14.5 % (ref 11.5–15.5)
WBC: 9.2 10*3/uL (ref 4.0–10.5)

## 2013-07-30 LAB — BASIC METABOLIC PANEL
BUN: 9 mg/dL (ref 6–23)
CO2: 21 mEq/L (ref 19–32)
Calcium: 9.1 mg/dL (ref 8.4–10.5)
Creatinine, Ser: 0.96 mg/dL (ref 0.50–1.35)

## 2013-07-30 LAB — ETHANOL: Alcohol, Ethyl (B): 91 mg/dL — ABNORMAL HIGH (ref 0–11)

## 2013-07-30 MED ORDER — SODIUM CHLORIDE 0.9 % IV BOLUS (SEPSIS)
1000.0000 mL | Freq: Once | INTRAVENOUS | Status: AC
  Administered 2013-07-30: 1000 mL via INTRAVENOUS

## 2013-07-30 NOTE — ED Notes (Signed)
Bed: WA10 Expected date:  Expected time:  Means of arrival:  Comments: EMS 

## 2013-07-30 NOTE — ED Notes (Signed)
Pt told family he felt altered, pt family states pt has been drinking all day. Pt sts he only had one beer. NO LOC N/V. Pt is alert and orientated.

## 2013-07-30 NOTE — ED Provider Notes (Signed)
  CSN: [id number]     Arrival date & time 07/30/13  2057 History     First MD Initiated Contact with Patient 07/30/13 2110     Chief Complaint  Patient presents with  . Alcohol Intoxication   (Consider location/radiation/quality/duration/timing/severity/associated sxs/prior Treatment) HPI.... patient felt lightheaded and possibly passed out today. He has been drinking alcohol. Unknown quantity.  No chest pain, dyspnea, neuro deficits.  No head or neck trauma.  Nothing makes symptoms better or worse. He is alert and oriented in emergency department.    Past Medical History  Diagnosis Date  . Hypertension   . Seizures     last episode 03/2013   Past Surgical History  Procedure Laterality Date  . Leg surgery     Family History  Problem Relation Age of Onset  . Diabetes Mellitus II Sister    History  Substance Use Topics  . Smoking status: Current Every Day Smoker -- 1.00 packs/day for 26 years    Types: Cigarettes  . Smokeless tobacco: Not on file  . Alcohol Use: 2.4 oz/week    4 Cans of beer per week     Comment: drinks heavily.    Review of Systems  All other systems reviewed and are negative.    Allergies  Dilaudid  Home Medications   Current Outpatient Rx  Name  Route  Sig  Dispense  Refill  . ibuprofen (ADVIL,MOTRIN) 800 MG tablet   Oral   Take 800 mg by mouth every 8 (eight) hours as needed for pain (pain).          BP 150/102  Pulse 92  Temp(Src) 97.8 F (36.6 C) (Oral)  Resp 16  Ht 6' (1.829 m)  Wt 210 lb (95.255 kg)  BMI 28.47 kg/m2  SpO2 91% Physical Exam  Nursing note and vitals reviewed. Constitutional: He is oriented to person, place, and time. He appears well-developed and well-nourished.  HENT:  Head: Normocephalic and atraumatic.   Poor dentition  Eyes: Conjunctivae and EOM are normal. Pupils are equal, round, and reactive to light.  Neck: Normal range of motion. Neck supple.  Cardiovascular: Normal rate, regular rhythm and normal  heart sounds.   Pulmonary/Chest: Effort normal and breath sounds normal.  Abdominal: Soft. Bowel sounds are normal.  Musculoskeletal: Normal range of motion.  Neurological: He is alert and oriented to person, place, and time.  Skin: Skin is warm and dry.  Psychiatric: He has a normal mood and affect.    ED Course   Procedures (including critical care time)  Labs Reviewed  ETHANOL - Abnormal; Notable for the following:    Alcohol, Ethyl (B) 91 (*)    All other components within normal limits  BASIC METABOLIC PANEL  CBC WITH DIFFERENTIAL   No results found. No diagnosis found.    Date: 07/30/2013  Rate: 85  Rhythm: normal sinus rhythm  QRS Axis: normal  Intervals: normal  ST/T Wave abnormalities: normal  Conduction Disutrbances: none  Narrative Interpretation: unremarkable    MDM  Patient feels better after IV hydration.  No neuro deficits. EKG is normal. Hemoglobin normal. glucose normal. Ethanol level 91  Donnetta Hutching, MD 07/30/13 2320

## 2013-07-30 NOTE — ED Notes (Signed)
Pt destating to 87 - pt placed on 2L

## 2013-11-15 ENCOUNTER — Encounter (HOSPITAL_COMMUNITY): Payer: Self-pay | Admitting: Emergency Medicine

## 2013-11-15 ENCOUNTER — Emergency Department (HOSPITAL_COMMUNITY)
Admission: EM | Admit: 2013-11-15 | Discharge: 2013-11-15 | Disposition: A | Discharge status: Home / Self Care | Payer: Medicare Other | Source: Home / Self Care | Attending: Emergency Medicine | Admitting: Emergency Medicine

## 2013-11-15 ENCOUNTER — Emergency Department (HOSPITAL_COMMUNITY)
Admission: EM | Admit: 2013-11-15 | Discharge: 2013-11-15 | Disposition: A | Discharge status: Home / Self Care | Payer: Medicare Other | Attending: Emergency Medicine | Admitting: Emergency Medicine

## 2013-11-15 DIAGNOSIS — R296 Repeated falls: Secondary | ICD-10-CM | POA: Insufficient documentation

## 2013-11-15 DIAGNOSIS — Z043 Encounter for examination and observation following other accident: Secondary | ICD-10-CM | POA: Insufficient documentation

## 2013-11-15 DIAGNOSIS — F17200 Nicotine dependence, unspecified, uncomplicated: Secondary | ICD-10-CM | POA: Insufficient documentation

## 2013-11-15 DIAGNOSIS — G40909 Epilepsy, unspecified, not intractable, without status epilepticus: Secondary | ICD-10-CM | POA: Insufficient documentation

## 2013-11-15 DIAGNOSIS — Y9289 Other specified places as the place of occurrence of the external cause: Secondary | ICD-10-CM | POA: Insufficient documentation

## 2013-11-15 DIAGNOSIS — J45909 Unspecified asthma, uncomplicated: Secondary | ICD-10-CM | POA: Insufficient documentation

## 2013-11-15 DIAGNOSIS — Y9389 Activity, other specified: Secondary | ICD-10-CM | POA: Insufficient documentation

## 2013-11-15 DIAGNOSIS — R51 Headache: Secondary | ICD-10-CM | POA: Insufficient documentation

## 2013-11-15 DIAGNOSIS — Z79899 Other long term (current) drug therapy: Secondary | ICD-10-CM | POA: Insufficient documentation

## 2013-11-15 DIAGNOSIS — R000 Tachycardia, unspecified: Secondary | ICD-10-CM | POA: Insufficient documentation

## 2013-11-15 DIAGNOSIS — R569 Unspecified convulsions: Secondary | ICD-10-CM

## 2013-11-15 DIAGNOSIS — I10 Essential (primary) hypertension: Secondary | ICD-10-CM | POA: Insufficient documentation

## 2013-11-15 DIAGNOSIS — W19XXXA Unspecified fall, initial encounter: Secondary | ICD-10-CM

## 2013-11-15 HISTORY — DX: Nontraumatic intracerebral hemorrhage, unspecified: I61.9

## 2013-11-15 HISTORY — DX: Unspecified asthma, uncomplicated: J45.909

## 2013-11-15 LAB — URINALYSIS, ROUTINE W REFLEX MICROSCOPIC
Ketones, ur: NEGATIVE mg/dL
Leukocytes, UA: NEGATIVE
Nitrite: NEGATIVE
Protein, ur: NEGATIVE mg/dL
Urobilinogen, UA: 0.2 mg/dL (ref 0.0–1.0)

## 2013-11-15 LAB — POCT I-STAT, CHEM 8
Calcium, Ion: 1.17 mmol/L (ref 1.12–1.23)
Glucose, Bld: 86 mg/dL (ref 70–99)
HCT: 51 % (ref 39.0–52.0)
TCO2: 24 mmol/L (ref 0–100)

## 2013-11-15 LAB — CBC
Hemoglobin: 17.4 g/dL — ABNORMAL HIGH (ref 13.0–17.0)
MCH: 33 pg (ref 26.0–34.0)
MCHC: 36 g/dL (ref 30.0–36.0)
Platelets: 249 10*3/uL (ref 150–400)
RDW: 13.6 % (ref 11.5–15.5)

## 2013-11-15 LAB — PHENYTOIN LEVEL, TOTAL: Phenytoin Lvl: <2.5 ug/mL — ABNORMAL LOW (ref 10.0–20.0)

## 2013-11-15 MED ORDER — PHENYTOIN 50 MG PO CHEW: 100.0000 mg | CHEWABLE_TABLET | Freq: Two times a day (BID) | ORAL | Status: DC

## 2013-11-15 MED ORDER — KETOROLAC TROMETHAMINE 30 MG/ML IJ SOLN
30.0000 mg | Freq: Once | INTRAMUSCULAR | Status: AC
  Administered 2013-11-15: 30 mg via INTRAVENOUS
  Filled 2013-11-15: qty 1

## 2013-11-15 MED ORDER — SODIUM CHLORIDE 0.9 % IV SOLN
1000.0000 mg | Freq: Once | INTRAVENOUS | Status: AC
  Administered 2013-11-15: 1000 mg via INTRAVENOUS
  Filled 2013-11-15: qty 20

## 2013-11-15 MED ORDER — HYDRALAZINE HCL 20 MG/ML IJ SOLN
10.0000 mg | INTRAMUSCULAR | Status: AC
  Administered 2013-11-15: 10 mg via INTRAVENOUS
  Filled 2013-11-15: qty 1

## 2013-11-15 MED ORDER — SODIUM CHLORIDE 0.9 % IV BOLUS (SEPSIS)
1000.0000 mL | Freq: Once | INTRAVENOUS | Status: AC
  Administered 2013-11-15: 1000 mL via INTRAVENOUS

## 2013-11-15 NOTE — ED Notes (Signed)
Pt ambulatory from ED waiting room to POD E 43.

## 2013-11-15 NOTE — ED Notes (Addendum)
Vital signs were not recorded- written on the stickers.  i logged them in. emt/liza

## 2013-11-15 NOTE — ED Notes (Signed)
Pt reports seem this am after seizure, discharged and while in parking lot felt his heart racing and states he fell to ground. No LOC. Reports headache. HR in 90's.

## 2013-11-15 NOTE — ED Notes (Signed)
PER EMS: pt from home, reports he had a seizure when he was sleeping and reports sore tongue. Pt A&O, answering questions and speaking appropriately. BP-260/110, HR-98, CBG-97, O2 sats 99% RA, RR-18. Pt reports hx of seizures and sts he is compliant with his dilantin.

## 2013-11-15 NOTE — ED Notes (Signed)
Report received, assumed care.  

## 2013-11-15 NOTE — ED Provider Notes (Addendum)
CSN: [id number]     Arrival date & time 11/15/13  0645 History   First MD Initiated Contact with Patient 11/15/13 [phone]     Chief Complaint  Patient presents with  . Seizures   HPI  Patient presents after a seizure.  He does have a seizure history, states that he is compliant with medication. Patient recalls going to bed yesterday, approximately 10 hours ago with headache, otherwise in his usual state of health.  She awoke on the floor, with a family member telling him that he had a seizure. He currently complains of diffuse head pain, with focal soreness about the left parietal area, as well as tongue pain. He currently has no disorientation, confusion, other significant pain, dyspnea, nausea. He smokes, drinks - and was counseled on the need for smoking cessation.     Past Medical History  Diagnosis Date  . Hypertension   . Seizures     last episode 03/2013  . Asthma   . Brain bleed    Past Surgical History  Procedure Laterality Date  . Leg surgery     Family History  Problem Relation Age of Onset  . Diabetes Mellitus II Sister    History  Substance Use Topics  . Smoking status: Current Every Day Smoker -- 1.00 packs/day for 26 years    Types: Cigarettes  . Smokeless tobacco: Not on file  . Alcohol Use: 2.4 oz/week    4 Cans of beer per week     Comment: drinks heavily.    Review of Systems  Constitutional:       Per HPI, otherwise negative  HENT:       Per HPI, otherwise negative  Respiratory:       Per HPI, otherwise negative  Cardiovascular:       Per HPI, otherwise negative  Gastrointestinal: Negative for vomiting.  Endocrine:       Negative aside from HPI  Genitourinary:       Neg aside from HPI   Musculoskeletal:       Per HPI, otherwise negative  Skin: Negative.   Neurological: Positive for seizures. Negative for syncope.    Allergies  Dilaudid  Home Medications   Current Outpatient Rx  Name  Route  Sig  Dispense  Refill  . ibuprofen  (ADVIL,MOTRIN) 800 MG tablet   Oral   Take 800 mg by mouth every 8 (eight) hours as needed for pain (pain).         . phenytoin (DILANTIN) 50 MG tablet   Oral   Chew 100 mg by mouth 2 (two) times daily.         Marland Kitchen PROAIR HFA 108 (90 BASE) MCG/ACT inhaler   Inhalation   Inhale 1-2 puffs into the lungs every 6 (six) hours as needed for wheezing or shortness of breath.           BP 180/122  Pulse 95  Resp 18  SpO2 95% Physical Exam  Nursing note and vitals reviewed. Constitutional: He is oriented to person, place, and time. He appears well-developed. No distress.  HENT:  Head: Normocephalic and atraumatic.    Mouth/Throat:    Eyes: Conjunctivae and EOM are normal.  Neck: No tracheal deviation present.  Cardiovascular: Normal rate and regular rhythm.   Pulmonary/Chest: Effort normal. No stridor. No respiratory distress.  Abdominal: He exhibits no distension.  Musculoskeletal: He exhibits no edema.  Neurological: He is alert and oriented to person, place, and time. He displays no atrophy  and no tremor. No cranial nerve deficit or sensory deficit. He exhibits normal muscle tone. He displays no seizure activity.  Skin: Skin is warm and dry.  Psychiatric: He has a normal mood and affect.    ED Course  Procedures (including critical care time) Labs Review Labs Reviewed  URINALYSIS, ROUTINE W REFLEX MICROSCOPIC  CBC  PHENYTOIN LEVEL, TOTAL   Imaging Review No results found.  EKG Interpretation   None       9:42 AM No new complaints Dilantin level low - IV ordered.  Patient in pain. Meds ordered.  He remains hypertensive  11:55 AM No new complaints.  BP down substantially from SBP>220  MDM   1. Seizure    patient presents after a seizure.  On exam the patient is awake and alert, complaining of headache, some pain in his mouth, but in no distress.  Patient does have hypertension, though this decreases both spontaneously and with hydralazine.  Patient had  no new neurologic complaints, and has a previously diagnosed seizure disorder.  Labs demonstrate subtherapeutic Dilantin level.  Patient had repletion of his medication, received analgesics, fluids, was discharged in stable condition with primary care followup.     Gerhard Munch, MD 11/15/13 1156  Gerhard Munch, MD 11/15/13 [phone]

## 2013-11-15 NOTE — ED Provider Notes (Signed)
CSN: [id number]     Arrival date & time 11/15/13  1302 History   First MD Initiated Contact with Patient 11/15/13 1454     Chief Complaint  Patient presents with  . Tachycardia  . Fall   (Consider location/radiation/quality/duration/timing/severity/associated sxs/prior Treatment) Patient is a 42 y.o. male presenting with fall. The history is provided by the patient. No language interpreter was used.  Fall This is a new problem. The current episode started today. Pertinent negatives include no abdominal pain, chest pain, coughing, fatigue, fever or weakness.   Pt is a 42 year old male who returns to the ER after just being seen. He was discharged to go home after being seen for history of a seizure. He was given a IV load of Dilantin and a one-time dose of hydralazine for his increased b/p. He reports that after discharge he was in the parking lot, felt his heart tracing and fell down. He denies chest pain or shortness of breath. He doesn't feel like his heart is racing at this time and is not having any dizziness or weakness.   Past Medical History  Diagnosis Date  . Hypertension   . Seizures     last episode 03/2013  . Asthma   . Brain bleed    Past Surgical History  Procedure Laterality Date  . Leg surgery     Family History  Problem Relation Age of Onset  . Diabetes Mellitus II Sister    History  Substance Use Topics  . Smoking status: Current Every Day Smoker -- 1.00 packs/day for 26 years    Types: Cigarettes  . Smokeless tobacco: Not on file  . Alcohol Use: 2.4 oz/week    4 Cans of beer per week     Comment: drinks heavily.    Review of Systems  Constitutional: Negative for fever and fatigue.  Respiratory: Negative for cough, chest tightness, shortness of breath, wheezing and stridor.   Cardiovascular: Negative for chest pain.  Gastrointestinal: Negative for abdominal pain and abdominal distention.  Neurological: Negative for dizziness, speech difficulty, weakness  and light-headedness.  All other systems reviewed and are negative.    Allergies  Dilaudid  Home Medications   Current Outpatient Rx  Name  Route  Sig  Dispense  Refill  . Aspirin-Acetaminophen-Caffeine (GOODY HEADACHE PO)   Oral   Take 1 packet by mouth daily as needed.         . phenytoin (DILANTIN) 50 MG tablet   Oral   Chew 100 mg by mouth 4 (four) times daily.         . traMADol (ULTRAM) 50 MG tablet   Oral   Take 50 mg by mouth every 6 (six) hours as needed for severe pain.         Marland Kitchen ibuprofen (ADVIL,MOTRIN) 800 MG tablet   Oral   Take 800 mg by mouth every 8 (eight) hours as needed for pain (pain).         Marland Kitchen PROAIR HFA 108 (90 BASE) MCG/ACT inhaler   Inhalation   Inhale 1-2 puffs into the lungs every 6 (six) hours as needed for wheezing or shortness of breath.           BP 178/126  Pulse 103  Temp(Src) 97.9 F (36.6 C) (Oral)  Resp 22  SpO2 95% Physical Exam  Nursing note and vitals reviewed. Constitutional: He is oriented to person, place, and time. He appears well-developed and well-nourished. No distress.  HENT:  Head: Normocephalic and  atraumatic.  Mouth/Throat: Oropharynx is clear and moist.  Eyes: Conjunctivae and EOM are normal. Pupils are equal, round, and reactive to light.  Neck: Normal range of motion. Neck supple. No JVD present. No tracheal deviation present. No thyromegaly present.  Cardiovascular: Normal rate, regular rhythm, normal heart sounds and intact distal pulses.  Exam reveals no gallop and no friction rub.   No murmur heard. Pulmonary/Chest: Effort normal and breath sounds normal. No respiratory distress. He has no wheezes.  Abdominal: Soft. Bowel sounds are normal. He exhibits no distension. There is no tenderness. There is no rebound.  Musculoskeletal: Normal range of motion.  Neurological: He is alert and oriented to person, place, and time. No cranial nerve deficit. Coordination normal.  Skin: Skin is warm and dry.   Psychiatric: He has a normal mood and affect. His behavior is normal. Judgment and thought content normal.    ED Course  Procedures (including critical care time) Labs Review Labs Reviewed - No data to display Imaging Review No results found.  EKG Interpretation    Date/Time:  Wednesday November 15 2013 13:09:23 EST Ventricular Rate:  100 PR Interval:  158 QRS Duration: 86 QT Interval:  346 QTC Calculation: 446 R Axis:   51 Text Interpretation:  Normal sinus rhythm Left ventricular hypertrophy Abnormal ECG Sinus rhythm Left ventricular hypertrophy No significant change since last tracing Abnormal ekg Confirmed by Gerhard Munch  MD ([id number]) on 11/15/2013 3:15:00 PM            MDM   1. Fall, initial encounter     No injuries from fall. Pt ambulating without difficulty. No focal deficits or weakness. Reassuring exam. Pt observed for a period of tme. EKG; normal sinus rhythm with left ventricular hypertrophy. No arrythmias noted, no reports of palpitations. Pt had food and drink and ambulated around unit. Stable for discharge. Follow-up with cards if continues to have feelings of palpitations. No chest pain or pressure, shortness of breath or difficulty breathing.     Irish Elders, NP 11/16/13 2217

## 2013-11-15 NOTE — ED Notes (Signed)
Seizure pads placed on bed rails and seizure precautions initiated.

## 2013-11-17 NOTE — ED Provider Notes (Signed)
Medical screening examination/treatment/procedure(s) were performed by non-physician practitioner and as supervising physician I was immediately available for consultation/collaboration.  EKG Interpretation    Date/Time:  Wednesday November 15 2013 13:09:23 EST Ventricular Rate:  100 PR Interval:  158 QRS Duration: 86 QT Interval:  346 QTC Calculation: 446 R Axis:   51 Text Interpretation:  Normal sinus rhythm Left ventricular hypertrophy Abnormal ECG Sinus rhythm Left ventricular hypertrophy No significant change since last tracing Abnormal ekg Confirmed by Gerhard Munch  MD ([id number]) on 11/15/2013 3:15:00 PM             Geoffery Lyons, MD 11/17/13 1043

## 2013-12-05 ENCOUNTER — Ambulatory Visit: Payer: Medicare Other | Attending: Internal Medicine | Admitting: Internal Medicine

## 2013-12-05 ENCOUNTER — Encounter: Payer: Self-pay | Admitting: Internal Medicine

## 2013-12-05 VITALS — BP 180/100 | HR 80 | Temp 97.0°F | Resp 16 | Wt 225.4 lb

## 2013-12-05 DIAGNOSIS — F17200 Nicotine dependence, unspecified, uncomplicated: Secondary | ICD-10-CM

## 2013-12-05 DIAGNOSIS — R079 Chest pain, unspecified: Secondary | ICD-10-CM

## 2013-12-05 DIAGNOSIS — I10 Essential (primary) hypertension: Secondary | ICD-10-CM

## 2013-12-05 DIAGNOSIS — R569 Unspecified convulsions: Secondary | ICD-10-CM

## 2013-12-05 DIAGNOSIS — R0609 Other forms of dyspnea: Secondary | ICD-10-CM

## 2013-12-05 LAB — COMPLETE METABOLIC PANEL WITH GFR
AST: 17 U/L (ref 0–37)
Albumin: 3.7 g/dL (ref 3.5–5.2)
Alkaline Phosphatase: 92 U/L (ref 39–117)
Creat: 0.91 mg/dL (ref 0.50–1.35)
GFR, Est Non African American: >89 mL/min
Glucose, Bld: 102 mg/dL — ABNORMAL HIGH (ref 70–99)
Potassium: 3.6 mEq/L (ref 3.5–5.3)
Sodium: 137 mEq/L (ref 135–145)
Total Bilirubin: 0.3 mg/dL (ref 0.3–1.2)
Total Protein: 6.5 g/dL (ref 6.0–8.3)

## 2013-12-05 LAB — CBC WITH DIFFERENTIAL/PLATELET
Basophils Absolute: 0 10*3/uL (ref 0.0–0.1)
Basophils Relative: 0 % (ref 0–1)
Eosinophils Absolute: 0 10*3/uL (ref 0.0–0.7)
MCH: 30.8 pg (ref 26.0–34.0)
MCHC: 34 g/dL (ref 30.0–36.0)
MCV: 90.4 fL (ref 78.0–100.0)
Monocytes Absolute: 1.2 10*3/uL — ABNORMAL HIGH (ref 0.1–1.0)
Neutro Abs: 6.4 10*3/uL (ref 1.7–7.7)
Neutrophils Relative %: 64 % (ref 43–77)
Platelets: 281 10*3/uL (ref 150–400)
RBC: 4.91 MIL/uL (ref 4.22–5.81)
RDW: 13.7 % (ref 11.5–15.5)

## 2013-12-05 LAB — LIPID PANEL
HDL: 45 mg/dL (ref >39)
LDL Cholesterol: 69 mg/dL (ref 0–99)
Total CHOL/HDL Ratio: 2.9 Ratio
Triglycerides: 87 mg/dL (ref <150)

## 2013-12-05 LAB — TSH: TSH: 1.623 u[IU]/mL (ref 0.350–4.500)

## 2013-12-05 MED ORDER — RAMIPRIL 5 MG PO CAPS: 5.0000 mg | ORAL_CAPSULE | Freq: Every day | ORAL | Status: DC

## 2013-12-05 MED ORDER — PHENYTOIN 50 MG PO CHEW: 100.0000 mg | CHEWABLE_TABLET | Freq: Two times a day (BID) | ORAL | Status: DC

## 2013-12-05 MED ORDER — ATENOLOL 50 MG PO TABS: 50.0000 mg | ORAL_TABLET | Freq: Every day | ORAL | Status: DC

## 2013-12-05 MED ORDER — NICOTINE 21 MG/24HR TD PT24: 21.0000 mg | MEDICATED_PATCH | Freq: Every day | TRANSDERMAL | Status: DC

## 2013-12-05 NOTE — Progress Notes (Signed)
Patient here for hospital follow up Has history of seizures, HTN Complains of his "heart" hurts and has been having some SOB

## 2013-12-05 NOTE — Progress Notes (Signed)
MRN: [id number] Name: Richard [name]  Sex: male Age: 42 y.o. DOB: 12/14/1971  Allergies: Dilaudid  Chief Complaint  Patient presents with  . Hospitalization Follow-up    HPI: Patient is 42 y.o. male who comes for the first time to establish medical care, a few weeks ago patient went to the emergency room had a seizure episode, his Dilantin level was low and was discharged on Dilantin 100 mg twice a day patient denies any more seizure episodes his blood pressure was also running high patient has been taking atenolol 50 mg daily today his blood pressure was elevated denies any headache or dizziness but reported to have exertional chest pain and exertional shortness of breath, EKG done today in the office has LVH unchanged from 3 weeks the patient denies any chest pain currently, patient does smoke cigarettes, advised to quit smoking and is going to try nicotine patch.  Past Medical History  Diagnosis Date  . Hypertension   . Seizures     last episode 03/2013  . Asthma   . Brain bleed     Past Surgical History  Procedure Laterality Date  . Leg surgery        Medication List       This list is accurate as of: 12/05/13  5:46 PM.  Always use your most recent med list.               atenolol 50 MG tablet  Commonly known as:  TENORMIN  Take 1 tablet (50 mg total) by mouth daily.     GOODY HEADACHE PO  Take 1 packet by mouth daily as needed.     ibuprofen 800 MG tablet  Commonly known as:  ADVIL,MOTRIN  Take 800 mg by mouth every 8 (eight) hours as needed for pain (pain).     nicotine 21 mg/24hr patch  Commonly known as:  NICODERM CQ  Place 1 patch (21 mg total) onto the skin daily.     phenytoin 50 MG tablet  Commonly known as:  DILANTIN  Chew 2 tablets (100 mg total) by mouth 2 (two) times daily.     PROAIR HFA 108 (90 BASE) MCG/ACT inhaler  Generic drug:  albuterol  Inhale 1-2 puffs into the lungs every 6 (six) hours as needed for wheezing or shortness of breath.      ramipril 5 MG capsule  Commonly known as:  ALTACE  Take 1 capsule (5 mg total) by mouth daily.     traMADol 50 MG tablet  Commonly known as:  ULTRAM  Take 50 mg by mouth every 6 (six) hours as needed for severe pain.        Meds ordered this encounter  Medications  . DISCONTD: atenolol (TENORMIN) 50 MG tablet    Sig: Take 50 mg by mouth daily.  . ramipril (ALTACE) 5 MG capsule    Sig: Take 1 capsule (5 mg total) by mouth daily.    Dispense:  90 capsule    Refill:  3  . atenolol (TENORMIN) 50 MG tablet    Sig: Take 1 tablet (50 mg total) by mouth daily.    Dispense:  30 tablet    Refill:  3  . phenytoin (DILANTIN) 50 MG tablet    Sig: Chew 2 tablets (100 mg total) by mouth 2 (two) times daily.    Dispense:  120 tablet    Refill:  3  . nicotine (NICODERM CQ) 21 mg/24hr patch    Sig: Place 1 patch (21  mg total) onto the skin daily.    Dispense:  28 patch    Refill:  0    Immunization History  Administered Date(s) Administered  . Influenza Whole 11/13/2010    Family History  Problem Relation Age of Onset  . Diabetes Mellitus II Sister   . Cancer Father     History  Substance Use Topics  . Smoking status: Current Every Day Smoker -- 1.00 packs/day for 26 years    Types: Cigarettes  . Smokeless tobacco: Not on file  . Alcohol Use: 2.4 oz/week    4 Cans of beer per week     Comment: drinks heavily.    Review of Systems  As noted in HPI  Filed Vitals:   12/05/13 1559  BP: 180/100  Pulse:   Temp:   Resp:     Physical Exam  Physical Exam  Constitutional: No distress.  Eyes: EOM are normal. Pupils are equal, round, and reactive to light.  Cardiovascular: Normal rate and regular rhythm.   No murmur heard. Pulmonary/Chest: Breath sounds normal. No respiratory distress. He has no wheezes. He has no rales.    CBC    Component Value Date/Time   WBC 9.5 11/15/2013 0744   RBC 5.27 11/15/2013 0744   HGB 17.3* 11/15/2013 0806   HCT 51.0 11/15/2013 0806    PLT 249 11/15/2013 0744   MCV 91.7 11/15/2013 0744   LYMPHSABS 3.2 07/30/2013 2146   MONOABS 0.8 07/30/2013 2146   EOSABS 0.0 07/30/2013 2146   BASOSABS 0.0 07/30/2013 2146    CMP     Component Value Date/Time   NA 142 11/15/2013 0806   K 4.1 11/15/2013 0806   CL 106 11/15/2013 0806   CO2 21 07/30/2013 2146   GLUCOSE 86 11/15/2013 0806   BUN 9 11/15/2013 0806   CREATININE 1.20 11/15/2013 0806   CALCIUM 9.1 07/30/2013 2146   PROT 6.5 06/19/2013 0040   ALBUMIN 3.2* 06/19/2013 0040   AST 18 06/19/2013 0040   ALT 21 06/19/2013 0040   ALKPHOS 77 06/19/2013 0040   BILITOT 0.6 06/19/2013 0040   GFRNONAA >90 07/30/2013 2146   GFRAA >90 07/30/2013 2146    No results found for this basename: chol,  tri,  ldl    No components found with this basename: hga1c    Lab Results  Component Value Date/Time   AST 18 06/19/2013 12:40 AM    Assessment and Plan  Essential hypertension, benign - Plan: Patient was given clonidine 0.2 mg stat, repeat blood pressure manual is 180/100, continue with atenolol, has started patient on ramipril (ALTACE) 5 MG capsule, will check, CBC with Differential, COMPLETE METABOLIC PANEL WITH GFR, TSH, Lipid panel, Vit D  25 hydroxy (rtn osteoporosis monitoring)  Exertional chest pain - Plan: Ambulatory referral to Cardiology  Exertional dyspnea - Plan: Ambulatory referral to Cardiology  SEIZURE DISORDER - Plan: phenytoin (DILANTIN) 50 MG tablet, Ambulatory referral to Neurology  Smoking .Marland Kitchen.. prescribe nicotine patch.  I have advised patient to follow up in 2 weeks for BP check up.  And go to the ER if develops any symptoms of chest pain or worsening sob, patient understand and verbalized the instructions.  Return in about 4 weeks (around 01/02/2014).  Doris Cheadle, MD

## 2013-12-06 ENCOUNTER — Emergency Department (HOSPITAL_COMMUNITY)
Admission: EM | Admit: 2013-12-06 | Discharge: 2013-12-07 | Disposition: A | Discharge status: Home / Self Care | Payer: Medicare Other | Attending: Emergency Medicine | Admitting: Emergency Medicine

## 2013-12-06 ENCOUNTER — Encounter (HOSPITAL_COMMUNITY): Payer: Self-pay | Admitting: Emergency Medicine

## 2013-12-06 ENCOUNTER — Telehealth: Payer: Self-pay | Admitting: *Deleted

## 2013-12-06 ENCOUNTER — Emergency Department (HOSPITAL_COMMUNITY): Payer: Medicare Other

## 2013-12-06 DIAGNOSIS — R509 Fever, unspecified: Secondary | ICD-10-CM | POA: Insufficient documentation

## 2013-12-06 DIAGNOSIS — R799 Abnormal finding of blood chemistry, unspecified: Secondary | ICD-10-CM | POA: Insufficient documentation

## 2013-12-06 DIAGNOSIS — J45909 Unspecified asthma, uncomplicated: Secondary | ICD-10-CM | POA: Insufficient documentation

## 2013-12-06 DIAGNOSIS — R110 Nausea: Secondary | ICD-10-CM | POA: Insufficient documentation

## 2013-12-06 DIAGNOSIS — R51 Headache: Secondary | ICD-10-CM | POA: Insufficient documentation

## 2013-12-06 DIAGNOSIS — Z5181 Encounter for therapeutic drug level monitoring: Secondary | ICD-10-CM

## 2013-12-06 DIAGNOSIS — G40909 Epilepsy, unspecified, not intractable, without status epilepticus: Secondary | ICD-10-CM

## 2013-12-06 DIAGNOSIS — Z79899 Other long term (current) drug therapy: Secondary | ICD-10-CM | POA: Insufficient documentation

## 2013-12-06 DIAGNOSIS — I10 Essential (primary) hypertension: Secondary | ICD-10-CM

## 2013-12-06 DIAGNOSIS — F17200 Nicotine dependence, unspecified, uncomplicated: Secondary | ICD-10-CM | POA: Insufficient documentation

## 2013-12-06 LAB — CBC
Hemoglobin: 17 g/dL (ref 13.0–17.0)
MCH: 32.9 pg (ref 26.0–34.0)
Platelets: 264 10*3/uL (ref 150–400)
RBC: 5.17 MIL/uL (ref 4.22–5.81)
RDW: 13.4 % (ref 11.5–15.5)
WBC: 10.4 10*3/uL (ref 4.0–10.5)

## 2013-12-06 LAB — VITAMIN D 25 HYDROXY (VIT D DEFICIENCY, FRACTURES): Vit D, 25-Hydroxy: 16 ng/mL — ABNORMAL LOW (ref 30–89)

## 2013-12-06 LAB — COMPREHENSIVE METABOLIC PANEL
Albumin: 3.8 g/dL (ref 3.5–5.2)
BUN: 6 mg/dL (ref 6–23)
CO2: 27 mEq/L (ref 19–32)
Calcium: 9.6 mg/dL (ref 8.4–10.5)
Chloride: 103 mEq/L (ref 96–112)
Creatinine, Ser: 0.92 mg/dL (ref 0.50–1.35)
GFR calc Af Amer: >90 mL/min (ref >90)
GFR calc non Af Amer: >90 mL/min (ref >90)
Total Bilirubin: 0.2 mg/dL — ABNORMAL LOW (ref 0.3–1.2)

## 2013-12-06 LAB — PHENYTOIN LEVEL, TOTAL: Phenytoin Lvl: 3.2 ug/mL — ABNORMAL LOW (ref 10.0–20.0)

## 2013-12-06 LAB — ETHANOL: Alcohol, Ethyl (B): 73 mg/dL — ABNORMAL HIGH (ref 0–11)

## 2013-12-06 LAB — POCT I-STAT TROPONIN I: Troponin i, poc: 0 ng/mL (ref 0.00–0.08)

## 2013-12-06 MED ORDER — LABETALOL HCL 5 MG/ML IV SOLN
20.0000 mg | INTRAVENOUS | Status: DC | PRN
  Administered 2013-12-06 (×3): 20 mg via INTRAVENOUS
  Filled 2013-12-06 (×3): qty 4

## 2013-12-06 MED ORDER — ONDANSETRON HCL 4 MG/2ML IJ SOLN
4.0000 mg | Freq: Once | INTRAMUSCULAR | Status: AC
  Administered 2013-12-06: 4 mg via INTRAVENOUS
  Filled 2013-12-06: qty 2

## 2013-12-06 MED ORDER — SODIUM CHLORIDE 0.9 % IV SOLN
400.0000 mg | Freq: Once | INTRAVENOUS | Status: AC
  Administered 2013-12-06: 400 mg via INTRAVENOUS
  Filled 2013-12-06: qty 8

## 2013-12-06 MED ORDER — PHENYTOIN SODIUM EXTENDED 100 MG PO CAPS
400.0000 mg | ORAL_CAPSULE | Freq: Once | ORAL | Status: AC
  Administered 2013-12-06: 400 mg via ORAL
  Filled 2013-12-06: qty 4

## 2013-12-06 NOTE — ED Notes (Signed)
Pt returned from CT °

## 2013-12-06 NOTE — ED Notes (Signed)
Pt right now at this time was walked to the bathroom by [facility] for the pt did not want to use the urinal in the room; per [facility] pt was combative with them on route to hospital

## 2013-12-06 NOTE — ED Provider Notes (Signed)
CSN: [id number]     Arrival date & time 12/06/13  1639 History   First MD Initiated Contact with Patient 12/06/13 1818     Chief Complaint  Patient presents with  . Altered Mental Status   (Consider location/radiation/quality/duration/timing/severity/associated sxs/prior Treatment) Patient is a 42 y.o. male presenting with altered mental status. The history is provided by the patient and a relative.  Altered Mental Status He has several complaints. He states that he has passed out numerous times today. He thinks that it happened 4 or 5 times. He feels like things are getting black. EMS had reported that he just seemed to be staring blankly. He does have history of seizure disorder and recently had his Dilantin dose adjusted because of a subtherapeutic level. He states he's been compliant with his Dilantin. He is also complaining of a left-sided headache. The headache is sharp he rates it 10/10. Nothing makes it better nothing makes it worse. He is noted that his blood pressures been running high today. He has had subjective fever and has had sweats but no chills. He did not check his temperature. He denies arthralgias or myalgias. There is nausea but without vomiting or diarrhea. He just saw a new PCP yesterday. He admits to drinking one beer today and thinks it was only a 12 ounce beer. He denies drug use. He does smoke one pack of cigarettes a day.  Past Medical History  Diagnosis Date  . Hypertension   . Seizures     last episode 03/2013  . Asthma   . Brain bleed    Past Surgical History  Procedure Laterality Date  . Leg surgery     Family History  Problem Relation Age of Onset  . Diabetes Mellitus II Sister   . Cancer Father    History  Substance Use Topics  . Smoking status: Current Every Day Smoker -- 1.00 packs/day for 26 years    Types: Cigarettes  . Smokeless tobacco: Not on file  . Alcohol Use: 2.4 oz/week    4 Cans of beer per week     Comment: drinks heavily. "pt sts  he only drinks one beer a day now"    Review of Systems  All other systems reviewed and are negative.    Allergies  Dilaudid  Home Medications   Current Outpatient Rx  Name  Route  Sig  Dispense  Refill  . amLODipine (NORVASC) 2.5 MG tablet   Oral   Take 2.5 mg by mouth daily.         . phenytoin (DILANTIN) 50 MG tablet   Oral   Chew 2 tablets (100 mg total) by mouth 2 (two) times daily.   120 tablet   3   . PROAIR HFA 108 (90 BASE) MCG/ACT inhaler   Inhalation   Inhale 1-2 puffs into the lungs every 6 (six) hours as needed for wheezing or shortness of breath.          . traMADol (ULTRAM) 50 MG tablet   Oral   Take 50 mg by mouth every 6 (six) hours as needed for severe pain.         Marland Kitchen atenolol (TENORMIN) 50 MG tablet   Oral   Take 1 tablet (50 mg total) by mouth daily.   30 tablet   3   . ramipril (ALTACE) 5 MG capsule   Oral   Take 1 capsule (5 mg total) by mouth daily.   90 capsule   3  BP 173/136  Pulse 85  Resp 17  SpO2 98% Physical Exam  Nursing note and vitals reviewed.  42 year old male, resting comfortably and in no acute distress. Vital signs are significant for hypertension with blood pressure 173/136. Oxygen saturation is 98%, which is normal. Head is normocephalic and atraumatic. PERRLA, EOMI. Oropharynx is clear. Fundi show no hemorrhage, exudate, or papilledema. Neck is nontender and supple without adenopathy or JVD. Back is nontender and there is no CVA tenderness. Lungs are clear without rales, wheezes, or rhonchi. Chest is nontender. Heart has regular rate and rhythm without murmur. Abdomen is soft, flat, nontender without masses or hepatosplenomegaly and peristalsis is normoactive. Extremities have no cyanosis or edema, full range of motion is present. Skin is warm and dry without rash. Neurologic: Mental status is normal, cranial nerves are intact, there are no motor or sensory deficits.  ED Course  Procedures (including  critical care time) Labs Review Results for orders placed during the hospital encounter of 12/06/13  COMPREHENSIVE METABOLIC PANEL      Result Value Range   Sodium 142  135 - 145 mEq/L   Potassium 3.6  3.5 - 5.1 mEq/L   Chloride 103  96 - 112 mEq/L   CO2 27  19 - 32 mEq/L   Glucose, Bld 89  70 - 99 mg/dL   BUN 6  6 - 23 mg/dL   Creatinine, Ser 2.84  0.50 - 1.35 mg/dL   Calcium 9.6  8.4 - 13.2 mg/dL   Total Protein 7.9  6.0 - 8.3 g/dL   Albumin 3.8  3.5 - 5.2 g/dL   AST 23  0 - 37 U/L   ALT 22  0 - 53 U/L   Alkaline Phosphatase 105  39 - 117 U/L   Total Bilirubin 0.2 (*) 0.3 - 1.2 mg/dL   GFR calc non Af Amer >90  >90 mL/min   GFR calc Af Amer >90  >90 mL/min  CBC      Result Value Range   WBC 10.4  4.0 - 10.5 K/uL   RBC 5.17  4.22 - 5.81 MIL/uL   Hemoglobin 17.0  13.0 - 17.0 g/dL   HCT 44.0  10.2 - 72.5 %   MCV 92.3  78.0 - 100.0 fL   MCH 32.9  26.0 - 34.0 pg   MCHC 35.6  30.0 - 36.0 g/dL   RDW 36.6  44.0 - 34.7 %   Platelets 264  150 - 400 K/uL  PHENYTOIN LEVEL, TOTAL      Result Value Range   Phenytoin Lvl 3.2 (*) 10.0 - 20.0 ug/mL  ETHANOL      Result Value Range   Alcohol, Ethyl (B) 73 (*) 0 - 11 mg/dL  POCT I-STAT TROPONIN I      Result Value Range   Troponin i, poc 0.00  0.00 - 0.08 ng/mL   Comment 3            Imaging Review Ct Head Wo Contrast  12/06/2013   CLINICAL DATA:  History of seizures.  Syncope.  EXAM: CT HEAD WITHOUT CONTRAST  TECHNIQUE: Contiguous axial images were obtained from the base of the skull through the vertex without intravenous contrast.  COMPARISON:  Head CT scan 10/29/2012 and 06/17/2011.  FINDINGS: Remote left frontal infarct is seen. There is some chronic microvascular ischemic change. No evidence of acute abnormality including infarction, hemorrhage, mass lesion, mass effect, midline shift or abnormal extra-axial fluid collection. There is chronic left maxillary  sinus disease with complete opacification of the visualized left maxillary  sinus and marked wall thickening. A small amount of mastoid fluid is seen bilaterally.  IMPRESSION: No acute finding.  Remote left frontal infarct.  Chronic left maxillary sinus disease.   Electronically Signed   By: Drusilla Kanner M.D.   On: 12/06/2013 19:36    EKG Interpretation    Date/Time:  Wednesday December 06 2013 17:03:41 EST Ventricular Rate:  77 PR Interval:  158 QRS Duration: 96 QT Interval:  406 QTC Calculation: 459 R Axis:   46 Text Interpretation:  Sinus rhythm Biatrial enlargement RSR' in V1 or V2, probably normal variant Left ventricular hypertrophy ST elev, probable normal early repol pattern When compared with ECG of 11/15/2013, No significant change was found Confirmed by [facility]  MD, Arrionna Serena ([id number]) on 12/06/2013 5:10:13 PM            MDM   1. Seizure disorder   2. Hypertension   3. Subtherapeutic phenytoin level    Syncopal or near syncopal episodes of uncertain cause. This certainly could be brief seizures or even though his seizure activity was not noted during these episodes. Severe hypertension with diastolic component being more severely elevated than the systolic component. Headache could be related to hypertensive urgency and he will be given labetalol in the ED to try and reduce his blood pressure and see if it improves his headache. Phenytoin level be checked and he will be sent for CT of the head. Old records are reviewed and he had been seen in the ED 3 weeks ago at which time phenytoin level was undetectable. He was given a loading dose in the ED and was told to increase his outpatient dose. The pressure in his PCPs office yesterday was 180/100.  Phenytoin level has come back very low and he is given an additional dose of phenytoin in the ED. He is feeling somewhat better after this. Blood pressures come down after a dose of labetalol. I talked with the patient and he states that he is actually only taking phenytoin 100 mg once a day instead of the twice a  day that it was supposed to be taken. He also states that he had run out of his amlodipine at least a month ago and he had been given new prescriptions for ramipril and atenolol which he had not gotten filled yet. The importance of getting started on medication was stressed to the patient. He is given initial doses of his amlodipine, atenolol, and ramipril.  Dione Booze, MD 12/07/13 [phone]

## 2013-12-06 NOTE — ED Notes (Signed)
phlebotomy at bedside.  

## 2013-12-06 NOTE — Telephone Encounter (Signed)
Contacted pt to notify pt of his lab results. Left a voicemail for pt to contact us before 3pm today or by Monday morning.

## 2013-12-06 NOTE — ED Notes (Signed)
Per EMS - pt coming from his moms house. Pt sat down and was just starring into space and wouldn't respond so mother called ems. Hx of seizures, takes dilation, hx of HTN. ems reports on the way in pt had 3 episodes of blank starring no seizure activity, eyes were reactive to light. Pt had 2 syncopal episodes on the way in, became unresponsive, maintained good O2 sats and HR. HR 90-100 NSR. Pt admits he had a beer today, denies drug use. Reports his doctor told him he shouldn't drink more than one beer a day. Pt was combative with ems. Pt had new meds started yesterday. Pt having repetitive questions. Reports he is compliant with meds. BP 210 palpated. Pt keeps saying "im good, where is my shirt, Im ready to go".

## 2013-12-06 NOTE — ED Notes (Signed)
Pharmacy tech at bedside 

## 2013-12-06 NOTE — ED Notes (Signed)
Pt c/o HA sts he feels sleepy and feels like he can't stay awake when he wants to stay awake.

## 2013-12-06 NOTE — ED Notes (Signed)
Pt in gown, on monitor, continuous pulse oximetry, blood pressure cuff and oxygen [place] (2L)

## 2013-12-06 NOTE — ED Notes (Signed)
Pt currently alert and oriented. Pt complaining of headache. Pain 6 out of 10. No cardiac or respiratory distress.

## 2013-12-06 NOTE — ED Notes (Signed)
Warm blanket given to pt; family at bedside.

## 2013-12-07 MED ORDER — RAMIPRIL 5 MG PO CAPS: 5.0000 mg | ORAL_CAPSULE | Freq: Every day | ORAL | Status: DC

## 2013-12-07 MED ORDER — METOPROLOL TARTRATE 25 MG PO TABS
50.0000 mg | ORAL_TABLET | Freq: Once | ORAL | Status: AC
  Administered 2013-12-07: 50 mg via ORAL
  Filled 2013-12-07: qty 2

## 2013-12-07 MED ORDER — AMLODIPINE BESYLATE 2.5 MG PO TABS: 2.5000 mg | ORAL_TABLET | Freq: Every day | ORAL | Status: DC

## 2013-12-07 MED ORDER — AMLODIPINE BESYLATE 2.5 MG PO TABS
2.5000 mg | ORAL_TABLET | Freq: Once | ORAL | Status: AC
  Administered 2013-12-07: 2.5 mg via ORAL
  Filled 2013-12-07: qty 1

## 2013-12-07 NOTE — ED Notes (Signed)
All belongings taken with pt.

## 2013-12-12 ENCOUNTER — Telehealth: Payer: Self-pay

## 2013-12-12 ENCOUNTER — Ambulatory Visit: Payer: Medicare Other | Admitting: Family

## 2013-12-12 MED ORDER — VITAMIN D (ERGOCALCIFEROL) 1.25 MG (50000 UNIT) PO CAPS: 50000.0000 [IU] | ORAL_CAPSULE | ORAL | Status: DC

## 2013-12-12 NOTE — Telephone Encounter (Signed)
Spoke with patient and is aware of his lab results Prescription for vit D sent to the [facility] on file

## 2013-12-12 NOTE — Telephone Encounter (Signed)
Message copied by Lestine Mount on Tue Dec 12, 2013  2:20 PM ------      Message from: Doris Cheadle      Created: Wed Dec 06, 2013  1:15 PM       Blood work reviewed, noticed low vitamin D, call patient advise to start ergocalciferol 50,000 units once a week for the duration of  12 weeks.       ------

## 2013-12-22 ENCOUNTER — Ambulatory Visit (INDEPENDENT_AMBULATORY_CARE_PROVIDER_SITE_OTHER): Payer: PRIVATE HEALTH INSURANCE | Admitting: Diagnostic Neuroimaging

## 2013-12-22 ENCOUNTER — Encounter: Payer: Self-pay | Admitting: Diagnostic Neuroimaging

## 2013-12-22 ENCOUNTER — Emergency Department (HOSPITAL_COMMUNITY): Payer: Medicare Other

## 2013-12-22 ENCOUNTER — Encounter (INDEPENDENT_AMBULATORY_CARE_PROVIDER_SITE_OTHER): Payer: Self-pay

## 2013-12-22 ENCOUNTER — Ambulatory Visit: Payer: Self-pay | Admitting: Diagnostic Neuroimaging

## 2013-12-22 ENCOUNTER — Inpatient Hospital Stay (HOSPITAL_COMMUNITY)
Admission: EM | Admit: 2013-12-22 | Discharge: 2013-12-23 | DRG: 305 | Discharge status: Against Medical Advice | Payer: Medicare Other | Attending: Internal Medicine | Admitting: Internal Medicine

## 2013-12-22 ENCOUNTER — Encounter (HOSPITAL_COMMUNITY): Payer: Self-pay | Admitting: Emergency Medicine

## 2013-12-22 VITALS — BP 162/126 | HR 65 | Temp 97.8°F | Ht 69.5 in | Wt 226.0 lb

## 2013-12-22 DIAGNOSIS — I674 Hypertensive encephalopathy: Secondary | ICD-10-CM | POA: Diagnosis present

## 2013-12-22 DIAGNOSIS — G40909 Epilepsy, unspecified, not intractable, without status epilepticus: Secondary | ICD-10-CM | POA: Diagnosis present

## 2013-12-22 DIAGNOSIS — Z87820 Personal history of traumatic brain injury: Secondary | ICD-10-CM

## 2013-12-22 DIAGNOSIS — R569 Unspecified convulsions: Secondary | ICD-10-CM | POA: Diagnosis present

## 2013-12-22 DIAGNOSIS — I10 Essential (primary) hypertension: Principal | ICD-10-CM | POA: Diagnosis present

## 2013-12-22 DIAGNOSIS — I160 Hypertensive urgency: Secondary | ICD-10-CM | POA: Diagnosis present

## 2013-12-22 DIAGNOSIS — Z79899 Other long term (current) drug therapy: Secondary | ICD-10-CM

## 2013-12-22 DIAGNOSIS — J45909 Unspecified asthma, uncomplicated: Secondary | ICD-10-CM | POA: Diagnosis present

## 2013-12-22 DIAGNOSIS — F17200 Nicotine dependence, unspecified, uncomplicated: Secondary | ICD-10-CM | POA: Diagnosis present

## 2013-12-22 DIAGNOSIS — Z7982 Long term (current) use of aspirin: Secondary | ICD-10-CM

## 2013-12-22 DIAGNOSIS — R519 Headache, unspecified: Secondary | ICD-10-CM | POA: Diagnosis present

## 2013-12-22 DIAGNOSIS — R51 Headache: Secondary | ICD-10-CM

## 2013-12-22 DIAGNOSIS — G40209 Localization-related (focal) (partial) symptomatic epilepsy and epileptic syndromes with complex partial seizures, not intractable, without status epilepticus: Secondary | ICD-10-CM

## 2013-12-22 DIAGNOSIS — R561 Post traumatic seizures: Secondary | ICD-10-CM

## 2013-12-22 DIAGNOSIS — R531 Weakness: Secondary | ICD-10-CM | POA: Diagnosis present

## 2013-12-22 LAB — DIFFERENTIAL
Basophils Absolute: 0 10*3/uL (ref 0.0–0.1)
Basophils Relative: 0 % (ref 0–1)
Eosinophils Absolute: 0 10*3/uL (ref 0.0–0.7)
Eosinophils Relative: 0 % (ref 0–5)
LYMPHS PCT: 28 % (ref 12–46)
Lymphs Abs: 2.3 10*3/uL (ref 0.7–4.0)
MONO ABS: 0.8 10*3/uL (ref 0.1–1.0)
MONOS PCT: 10 % (ref 3–12)
Neutro Abs: 5.2 10*3/uL (ref 1.7–7.7)
Neutrophils Relative %: 62 % (ref 43–77)

## 2013-12-22 LAB — COMPREHENSIVE METABOLIC PANEL
ALT: 16 U/L (ref 0–53)
AST: 17 U/L (ref 0–37)
Albumin: 3.8 g/dL (ref 3.5–5.2)
Alkaline Phosphatase: 92 U/L (ref 39–117)
BUN: 13 mg/dL (ref 6–23)
CALCIUM: 9.4 mg/dL (ref 8.4–10.5)
CO2: 26 mEq/L (ref 19–32)
CREATININE: 0.92 mg/dL (ref 0.50–1.35)
Chloride: 99 mEq/L (ref 96–112)
GLUCOSE: 94 mg/dL (ref 70–99)
Potassium: 4.4 mEq/L (ref 3.7–5.3)
SODIUM: 137 meq/L (ref 137–147)
Total Bilirubin: <0.2 mg/dL — ABNORMAL LOW (ref 0.3–1.2)
Total Protein: 7.4 g/dL (ref 6.0–8.3)

## 2013-12-22 LAB — RAPID URINE DRUG SCREEN, HOSP PERFORMED
Amphetamines: NOT DETECTED
BARBITURATES: NOT DETECTED
Benzodiazepines: NOT DETECTED
COCAINE: NOT DETECTED
Opiates: NOT DETECTED
Tetrahydrocannabinol: NOT DETECTED

## 2013-12-22 LAB — APTT: APTT: 29 s (ref 24–37)

## 2013-12-22 LAB — URINALYSIS, ROUTINE W REFLEX MICROSCOPIC
BILIRUBIN URINE: NEGATIVE
Glucose, UA: NEGATIVE mg/dL
Hgb urine dipstick: NEGATIVE
KETONES UR: NEGATIVE mg/dL
Leukocytes, UA: NEGATIVE
NITRITE: NEGATIVE
Protein, ur: NEGATIVE mg/dL
SPECIFIC GRAVITY, URINE: 1.015 (ref 1.005–1.030)
Urobilinogen, UA: 0.2 mg/dL (ref 0.0–1.0)
pH: 7.5 (ref 5.0–8.0)

## 2013-12-22 LAB — CBC
HEMATOCRIT: 45.3 % (ref 39.0–52.0)
HEMOGLOBIN: 15.2 g/dL (ref 13.0–17.0)
MCH: 30.6 pg (ref 26.0–34.0)
MCHC: 33.6 g/dL (ref 30.0–36.0)
MCV: 91.3 fL (ref 78.0–100.0)
Platelets: 319 10*3/uL (ref 150–400)
RBC: 4.96 MIL/uL (ref 4.22–5.81)
RDW: 14 % (ref 11.5–15.5)
WBC: 8.3 10*3/uL (ref 4.0–10.5)

## 2013-12-22 LAB — PROTIME-INR
INR: 0.87 (ref 0.00–1.49)
Prothrombin Time: 11.7 seconds (ref 11.6–15.2)

## 2013-12-22 LAB — TROPONIN I

## 2013-12-22 LAB — ETHANOL: Alcohol, Ethyl (B): <11 mg/dL (ref 0–11)

## 2013-12-22 MED ORDER — PHENYTOIN SODIUM EXTENDED 100 MG PO CAPS
300.0000 mg | ORAL_CAPSULE | Freq: Every day | ORAL | Status: DC
Start: 2013-12-22 — End: 2013-12-25

## 2013-12-22 NOTE — ED Notes (Signed)
Bed: TT01 Expected date:  Expected time:  Means of arrival:  Comments: Headache; 43 yo
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
## 2013-12-22 NOTE — ED Notes (Signed)
To ED from home via [facility], c/o HA and HTN X90 min, also sts SOB, 99% RA, no neuro deficits, A/O X3, ambulatory and in NAD

## 2013-12-22 NOTE — Patient Instructions (Signed)
Seizure, Adult A seizure is abnormal electrical activity in the brain. Seizures can cause a change in attention or behavior (altered mental status). Seizures often involve uncontrollable shaking (convulsions). Seizures usually last from 30 seconds to 2 minutes. Epilepsy is a brain disorder in which a patient has repeated seizures over time. CAUSES  There are many different problems that can cause seizures. In some cases, no cause is found. Common causes of seizures include:  Head injuries.  Brain tumors.  Infections.  Imbalance of chemicals in the blood.  Kidney failure or liver failure.  Heart disease.  Drug abuse.  Stroke.  Withdrawal from certain drugs or alcohol.  Birth defects.  Malfunction of a neurosurgical device placed in the brain. SYMPTOMS  Symptoms vary depending on the part of the brain that is involved. Right before a seizure, you may have a warning (aura) that a seizure is about to occur. An aura may include the following symptoms:   Fear or anxiety.  Nausea.  Feeling like the room is spinning (vertigo).  Vision changes, such as seeing flashing lights or spots. Common symptoms during a seizure include:  Convulsions.  Drooling.  Rapid eye movements.  Grunting.  Loss of bladder and bowel control.  Bitter taste in the mouth. After a seizure, you may feel confused and sleepy. You may also have an injury resulting from convulsions during the seizure. DIAGNOSIS  Your caregiver will perform a physical exam and run some tests to determine the type and cause of your seizure. These tests may include:  Blood tests.  A lumbar puncture test. In this test, a small amount of fluid is removed from the spine and examined.  Electrocardiography (ECG). This test records the electrical activity in your heart.  Imaging tests, such as computed tomography (CT) scans or magnetic resonance imaging (MRI).  Electroencephalography (EEG). This test records the electrical  activity in your brain. TREATMENT  Seizures usually stop on their own. Treatment will depend on the cause of your seizure. In some cases, medicine may be given to prevent future seizures. HOME CARE INSTRUCTIONS   If you are given medicines, take them exactly as prescribed by your caregiver.  Keep all follow-up appointments as directed by your caregiver.  Do not swim or drive until your caregiver says it is okay.  Teach friends and family what to do if you have a seizure. They should:  Lay you on the ground to prevent a fall.  Put a cushion under your head.  Loosen any tight clothing around your neck.  Turn you on your side. If vomiting occurs, this helps keep your airway clear.  Stay with you until you recover. SEEK IMMEDIATE MEDICAL CARE IF:  The seizure lasts longer than 2 to 5 minutes.  The seizure is severe or the person does not wake up after the seizure.  The person has altered mental status. Drive the person to the emergency department or call your local emergency services (911 in [place] MAKE SURE YOU:  Understand these instructions.  Will watch your condition.  Will get help right away if you are not doing well or get worse. Document Released: 11/27/2000 Document Revised: 02/22/2012 Document Reviewed: 11/18/2011 [facility] Patient Information 2014 [place], [place].

## 2013-12-22 NOTE — ED Provider Notes (Signed)
CSN: [id number]     Arrival date & time 12/22/13  2147 History   First MD Initiated Contact with Patient 12/22/13 2154     Chief Complaint  Patient presents with  . Headache  . Hypertension   (Consider location/radiation/quality/duration/timing/severity/associated sxs/prior Treatment) HPI Comments: Patient with h/o TBI in early 1990's, seizure disorder 2/2 TBI on dilantin, poorly controlled HTN on amlodipine, atenolol, ramipril and states he is compliant with these -- presents with c/o HTN, HA, and increasing frequency of short-lived episodes of L sided vision loss, left arm and leg weakness. Patient states that he has approximately 6 episodes lasting several minutes over the past week. Prior to this, he had approximately 3 episodes in the previous month. Patient had last episode about 8pm tonight. EMS transported to the hospital for evaluation. Currently asymptomatic. No treatments PTA. The onset of this condition was acute. The course is constant. Aggravating factors: none. Alleviating factors: none.    The history is provided by the patient and medical records.    Past Medical History  Diagnosis Date  . Hypertension   . Seizures     last episode 03/2013  . Asthma   . Brain bleed    Past Surgical History  Procedure Laterality Date  . Leg surgery     Family History  Problem Relation Age of Onset  . Diabetes Mellitus II Sister   . Cancer Father    History  Substance Use Topics  . Smoking status: Current Every Day Smoker -- 1.00 packs/day for 26 years    Types: Cigarettes  . Smokeless tobacco: Never Used  . Alcohol Use: 2.4 oz/week    4 Cans of beer per week     Comment: drinks heavily. "pt sts he only drinks one beer a day now"    Review of Systems  Constitutional: Negative for fever.  HENT: Negative for congestion, dental problem, rhinorrhea and sinus pressure.   Eyes: Positive for visual disturbance. Negative for photophobia, discharge and redness.  Respiratory: Negative  for shortness of breath.   Cardiovascular: Negative for chest pain.  Gastrointestinal: Negative for nausea and vomiting.  Musculoskeletal: Negative for gait problem, neck pain and neck stiffness.  Skin: Negative for rash.  Neurological: Positive for weakness, numbness and headaches. Negative for syncope, speech difficulty and light-headedness.  Psychiatric/Behavioral: Negative for confusion.    Allergies  Dilaudid  Home Medications   Current Outpatient Rx  Name  Route  Sig  Dispense  Refill  . amLODipine (NORVASC) 2.5 MG tablet   Oral   Take 1 tablet (2.5 mg total) by mouth daily.   30 tablet   0   . aspirin 325 MG buffered tablet   Oral   Take 325 mg by mouth daily.         . Aspirin-Acetaminophen-Caffeine (GOODYS EXTRA STRENGTH PO)   Oral   Take by mouth.         Marland Kitchen atenolol (TENORMIN) 50 MG tablet   Oral   Take 1 tablet (50 mg total) by mouth daily.   30 tablet   3   . phenytoin (DILANTIN) 100 MG ER capsule   Oral   Take 3 capsules (300 mg total) by mouth daily.   90 capsule   12   . PROAIR HFA 108 (90 BASE) MCG/ACT inhaler   Inhalation   Inhale 1-2 puffs into the lungs every 6 (six) hours as needed for wheezing or shortness of breath.          Marland Kitchen  ramipril (ALTACE) 5 MG capsule   Oral   Take 1 capsule (5 mg total) by mouth daily.   90 capsule   3   . traMADol (ULTRAM) 50 MG tablet   Oral   Take 50 mg by mouth every 6 (six) hours as needed for severe pain.         . Vitamin D, Ergocalciferol, (DRISDOL) 50000 UNITS CAPS capsule   Oral   Take 1 capsule (50,000 Units total) by mouth every 7 (seven) days.   12 capsule   0    BP 190/131  Pulse 65  Temp(Src) 98 F (36.7 C) (Oral)  Resp 18  Ht 5\' 9"  (1.753 m)  Wt 226 lb (102.513 kg)  BMI 33.36 kg/m2  SpO2 97% Physical Exam  Nursing note and vitals reviewed. Constitutional: He is oriented to person, place, and time. He appears well-developed and well-nourished.  HENT:  Head: Normocephalic  and atraumatic.  Right Ear: Tympanic membrane, external ear and ear canal normal.  Left Ear: Tympanic membrane, external ear and ear canal normal.  Nose: Nose normal.  Mouth/Throat: Uvula is midline, oropharynx is clear and moist and mucous membranes are normal.  Eyes: Conjunctivae, EOM and lids are normal. Pupils are equal, round, and reactive to light.  Neck: Normal range of motion. Neck supple.  Cardiovascular: Normal rate and regular rhythm.   Pulmonary/Chest: Effort normal and breath sounds normal.  Abdominal: Soft. There is no tenderness.  Musculoskeletal: Normal range of motion.       Cervical back: He exhibits normal range of motion, no tenderness and no bony tenderness.  Neurological: He is alert and oriented to person, place, and time. He has normal strength and normal reflexes. No cranial nerve deficit or sensory deficit. He exhibits normal muscle tone. He displays a negative Romberg sign. Coordination and gait normal. GCS eye subscore is 4. GCS verbal subscore is 5. GCS motor subscore is 6.  Skin: Skin is warm and dry.  Psychiatric: He has a normal mood and affect.    ED Course  Procedures (including critical care time) Labs Review Labs Reviewed  COMPREHENSIVE METABOLIC PANEL - Abnormal; Notable for the following:    Total Bilirubin <0.2 (*)    All other components within normal limits  URINALYSIS, ROUTINE W REFLEX MICROSCOPIC - Abnormal; Notable for the following:    APPearance CLOUDY (*)    All other components within normal limits  CBC  DIFFERENTIAL  URINE RAPID DRUG SCREEN (HOSP PERFORMED)  TROPONIN I  ETHANOL  PROTIME-INR  APTT  PHENYTOIN LEVEL, TOTAL   Imaging Review Ct Head Wo Contrast  12/22/2013   CLINICAL DATA:  Headache.  EXAM: CT HEAD WITHOUT CONTRAST  TECHNIQUE: Contiguous axial images were obtained from the base of the skull through the vertex without intravenous contrast.  COMPARISON:  Head CT scan 12/06/2013.  FINDINGS: Remote left frontal infarct is  again seen. There is some chronic microvascular ischemic change. No evidence of acute abnormality including infarction, hemorrhage, mass lesion, mass effect, midline shift or abnormal extra-axial fluid collection. No hydrocephalus or pneumocephalus. Chronic left maxillary sinus disease appears unchanged.  IMPRESSION: No acute finding.  Remote left frontal infarct and chronic microvascular ischemic change.  Chronic left maxillary sinusitis.   Electronically Signed   By: Inge Rise M.D.   On: 12/22/2013 23:30    EKG Interpretation   None      10:22 PM Patient seen and examined. Work-up initiated. Medications ordered.   Vital signs reviewed and are  as follows: Filed Vitals:   12/22/13 2154  BP: 190/131  Pulse: 65  Temp: 98 F (36.7 C)  Resp: 18   Patient discussed with and seen by Dr. Stevie Kern. There is obvious concern for TIA vs complex migraine.    Date: 12/23/2013  Rate: 62  Rhythm: normal sinus rhythm  QRS Axis: left  Intervals: normal  ST/T Wave abnormalities: normal  Conduction Disutrbances:none  Narrative Interpretation:   Old EKG Reviewed: unchanged from 12/08/2013   Spoke with Dr. [place] Pink who will admit.   BP 164/103  Pulse 95  Temp(Src) 98.5 F (36.9 C) (Oral)  Resp 18  Ht 5\' 9"  (1.753 m)  Wt 226 lb (102.513 kg)  BMI 33.36 kg/m2  SpO2 95%   MDM   1. Hypertension   2. Weakness    Patient with HTN, HA. Concern for TIA vs complex migraine. Will admit for TIA work-up.     Carlisle Cater, PA-C 12/23/13 [phone]

## 2013-12-23 ENCOUNTER — Observation Stay (HOSPITAL_COMMUNITY)
Admission: EM | Admit: 2013-12-23 | Discharge: 2013-12-25 | Disposition: A | Discharge status: Home / Self Care | Payer: PRIVATE HEALTH INSURANCE | Attending: Internal Medicine | Admitting: Internal Medicine

## 2013-12-23 ENCOUNTER — Encounter (HOSPITAL_COMMUNITY): Payer: Self-pay | Admitting: Emergency Medicine

## 2013-12-23 ENCOUNTER — Encounter (HOSPITAL_COMMUNITY): Payer: Self-pay

## 2013-12-23 DIAGNOSIS — I160 Hypertensive urgency: Secondary | ICD-10-CM

## 2013-12-23 DIAGNOSIS — I10 Essential (primary) hypertension: Principal | ICD-10-CM | POA: Diagnosis present

## 2013-12-23 DIAGNOSIS — R51 Headache: Secondary | ICD-10-CM | POA: Diagnosis present

## 2013-12-23 DIAGNOSIS — R519 Headache, unspecified: Secondary | ICD-10-CM | POA: Diagnosis present

## 2013-12-23 DIAGNOSIS — R531 Weakness: Secondary | ICD-10-CM | POA: Diagnosis present

## 2013-12-23 DIAGNOSIS — R1110 Vomiting, unspecified: Secondary | ICD-10-CM

## 2013-12-23 DIAGNOSIS — Z7982 Long term (current) use of aspirin: Secondary | ICD-10-CM | POA: Insufficient documentation

## 2013-12-23 DIAGNOSIS — F17200 Nicotine dependence, unspecified, uncomplicated: Secondary | ICD-10-CM | POA: Diagnosis present

## 2013-12-23 DIAGNOSIS — G40909 Epilepsy, unspecified, not intractable, without status epilepticus: Secondary | ICD-10-CM | POA: Diagnosis present

## 2013-12-23 DIAGNOSIS — J45909 Unspecified asthma, uncomplicated: Secondary | ICD-10-CM | POA: Diagnosis present

## 2013-12-23 DIAGNOSIS — I674 Hypertensive encephalopathy: Secondary | ICD-10-CM | POA: Diagnosis present

## 2013-12-23 DIAGNOSIS — R29810 Facial weakness: Secondary | ICD-10-CM

## 2013-12-23 DIAGNOSIS — J321 Chronic frontal sinusitis: Secondary | ICD-10-CM

## 2013-12-23 DIAGNOSIS — R569 Unspecified convulsions: Secondary | ICD-10-CM

## 2013-12-23 DIAGNOSIS — Z87820 Personal history of traumatic brain injury: Secondary | ICD-10-CM | POA: Insufficient documentation

## 2013-12-23 DIAGNOSIS — Z79899 Other long term (current) drug therapy: Secondary | ICD-10-CM | POA: Diagnosis not present

## 2013-12-23 DIAGNOSIS — R29898 Other symptoms and signs involving the musculoskeletal system: Secondary | ICD-10-CM | POA: Insufficient documentation

## 2013-12-23 DIAGNOSIS — M6281 Muscle weakness (generalized): Secondary | ICD-10-CM

## 2013-12-23 DIAGNOSIS — I517 Cardiomegaly: Secondary | ICD-10-CM

## 2013-12-23 DIAGNOSIS — G43809 Other migraine, not intractable, without status migrainosus: Secondary | ICD-10-CM

## 2013-12-23 DIAGNOSIS — G459 Transient cerebral ischemic attack, unspecified: Secondary | ICD-10-CM | POA: Diagnosis present

## 2013-12-23 DIAGNOSIS — R079 Chest pain, unspecified: Secondary | ICD-10-CM

## 2013-12-23 LAB — HEMOGLOBIN A1C
Hgb A1c MFr Bld: 6.1 % — ABNORMAL HIGH (ref <5.7)
MEAN PLASMA GLUCOSE: 128 mg/dL — AB (ref <117)

## 2013-12-23 LAB — LIPID PANEL
CHOL/HDL RATIO: 2.8 ratio
Cholesterol: 146 mg/dL (ref 0–200)
HDL: 53 mg/dL (ref >39)
LDL Cholesterol: 80 mg/dL (ref 0–99)
Triglycerides: 65 mg/dL (ref <150)
VLDL: 13 mg/dL (ref 0–40)

## 2013-12-23 LAB — GLUCOSE, CAPILLARY
GLUCOSE-CAPILLARY: 123 mg/dL — AB (ref 70–99)
Glucose-Capillary: 108 mg/dL — ABNORMAL HIGH (ref 70–99)

## 2013-12-23 LAB — MRSA PCR SCREENING: MRSA BY PCR: NEGATIVE

## 2013-12-23 LAB — PHENYTOIN LEVEL, TOTAL: PHENYTOIN LVL: 2.7 ug/mL — AB (ref 10.0–20.0)

## 2013-12-23 MED ORDER — HYDRALAZINE HCL 20 MG/ML IJ SOLN
10.0000 mg | Freq: Once | INTRAMUSCULAR | Status: AC
  Administered 2013-12-23: 10 mg via INTRAVENOUS

## 2013-12-23 MED ORDER — ACETAMINOPHEN 325 MG PO TABS
975.0000 mg | ORAL_TABLET | Freq: Once | ORAL | Status: AC
  Administered 2013-12-23: 975 mg via ORAL
  Filled 2013-12-23: qty 3

## 2013-12-23 MED ORDER — LISINOPRIL 5 MG PO TABS
5.0000 mg | ORAL_TABLET | Freq: Every day | ORAL | Status: DC
Start: 2013-12-23 — End: 2013-12-23
  Administered 2013-12-23: 5 mg via ORAL
  Filled 2013-12-23: qty 1

## 2013-12-23 MED ORDER — ACETAMINOPHEN 325 MG PO TABS
650.0000 mg | ORAL_TABLET | Freq: Four times a day (QID) | ORAL | Status: DC | PRN
  Administered 2013-12-23 (×2): 650 mg via ORAL
  Filled 2013-12-23 (×2): qty 2

## 2013-12-23 MED ORDER — TRAMADOL HCL 50 MG PO TABS
50.0000 mg | ORAL_TABLET | Freq: Four times a day (QID) | ORAL | Status: DC | PRN
  Administered 2013-12-23 (×3): 50 mg via ORAL
  Filled 2013-12-23 (×3): qty 1

## 2013-12-23 MED ORDER — AMLODIPINE BESYLATE 10 MG PO TABS
10.0000 mg | ORAL_TABLET | Freq: Every day | ORAL | Status: DC
  Filled 2013-12-23: qty 1

## 2013-12-23 MED ORDER — LISINOPRIL 10 MG PO TABS
10.0000 mg | ORAL_TABLET | Freq: Once | ORAL | Status: DC
  Filled 2013-12-23: qty 1

## 2013-12-23 MED ORDER — PHENYTOIN SODIUM EXTENDED 100 MG PO CAPS
300.0000 mg | ORAL_CAPSULE | Freq: Every day | ORAL | Status: DC
  Filled 2013-12-23: qty 3

## 2013-12-23 MED ORDER — CHLORDIAZEPOXIDE HCL 5 MG PO CAPS
10.0000 mg | ORAL_CAPSULE | Freq: Three times a day (TID) | ORAL | Status: DC
  Administered 2013-12-23 (×2): 10 mg via ORAL
  Filled 2013-12-23 (×2): qty 2

## 2013-12-23 MED ORDER — IBUPROFEN 600 MG PO TABS
600.0000 mg | ORAL_TABLET | Freq: Once | ORAL | Status: DC
  Filled 2013-12-23: qty 1

## 2013-12-23 MED ORDER — LISINOPRIL 20 MG PO TABS: 20.0000 mg | ORAL_TABLET | Freq: Every day | ORAL | Status: DC

## 2013-12-23 MED ORDER — CARVEDILOL 25 MG PO TABS
25.0000 mg | ORAL_TABLET | Freq: Two times a day (BID) | ORAL | Status: DC
  Filled 2013-12-23 (×3): qty 1

## 2013-12-23 MED ORDER — PHENYTOIN SODIUM EXTENDED 100 MG PO CAPS
300.0000 mg | ORAL_CAPSULE | Freq: Every day | ORAL | Status: DC
  Administered 2013-12-23: 300 mg via ORAL
  Filled 2013-12-23: qty 3

## 2013-12-23 MED ORDER — FENTANYL CITRATE 0.05 MG/ML IJ SOLN
25.0000 ug | Freq: Once | INTRAMUSCULAR | Status: AC
  Administered 2013-12-23: 25 ug via INTRAVENOUS
  Filled 2013-12-23: qty 2

## 2013-12-23 MED ORDER — IBUPROFEN 600 MG PO TABS
600.0000 mg | ORAL_TABLET | Freq: Once | ORAL | Status: AC
  Administered 2013-12-23: 600 mg via ORAL
  Filled 2013-12-23: qty 1

## 2013-12-23 MED ORDER — ASPIRIN BUFFERED 325 MG PO TABS
325.0000 mg | ORAL_TABLET | Freq: Every day | ORAL | Status: DC
  Filled 2013-12-23: qty 1

## 2013-12-23 MED ORDER — VITAMIN B-1 100 MG PO TABS
100.0000 mg | ORAL_TABLET | Freq: Every day | ORAL | Status: DC
  Administered 2013-12-23: 100 mg via ORAL
  Filled 2013-12-23 (×2): qty 1

## 2013-12-23 MED ORDER — SODIUM CHLORIDE 0.9 % IV SOLN
500.0000 mg | Freq: Once | INTRAVENOUS | Status: AC
  Administered 2013-12-23: 500 mg via INTRAVENOUS
  Filled 2013-12-23 (×2): qty 10

## 2013-12-23 MED ORDER — ALBUTEROL SULFATE HFA 108 (90 BASE) MCG/ACT IN AERS: 1.0000 | INHALATION_SPRAY | Freq: Four times a day (QID) | RESPIRATORY_TRACT | Status: DC | PRN

## 2013-12-23 MED ORDER — HYDRALAZINE HCL 50 MG PO TABS
100.0000 mg | ORAL_TABLET | Freq: Three times a day (TID) | ORAL | Status: DC
  Filled 2013-12-23 (×2): qty 2

## 2013-12-23 MED ORDER — ASPIRIN 325 MG PO TABS
325.0000 mg | ORAL_TABLET | Freq: Every day | ORAL | Status: DC
  Administered 2013-12-23 (×2): 325 mg via ORAL
  Filled 2013-12-23 (×2): qty 1

## 2013-12-23 MED ORDER — FOLIC ACID 1 MG PO TABS
1.0000 mg | ORAL_TABLET | Freq: Every day | ORAL | Status: DC
  Administered 2013-12-23: 1 mg via ORAL
  Filled 2013-12-23: qty 1

## 2013-12-23 MED ORDER — AMLODIPINE BESYLATE 10 MG PO TABS
10.0000 mg | ORAL_TABLET | Freq: Every day | ORAL | Status: DC
  Administered 2013-12-23: 10 mg via ORAL
  Filled 2013-12-23: qty 1

## 2013-12-23 MED ORDER — HYDRALAZINE HCL 20 MG/ML IJ SOLN
5.0000 mg | Freq: Four times a day (QID) | INTRAMUSCULAR | Status: DC | PRN
  Administered 2013-12-23: 5 mg via INTRAVENOUS
  Filled 2013-12-23: qty 1
  Filled 2013-12-23: qty 0.25

## 2013-12-23 MED ORDER — CLONIDINE HCL 0.1 MG PO TABS
0.1000 mg | ORAL_TABLET | Freq: Four times a day (QID) | ORAL | Status: DC | PRN
  Administered 2013-12-23: 0.1 mg via ORAL
  Filled 2013-12-23 (×2): qty 1

## 2013-12-23 MED ORDER — HYDRALAZINE HCL 50 MG PO TABS
50.0000 mg | ORAL_TABLET | Freq: Three times a day (TID) | ORAL | Status: DC
  Administered 2013-12-23: 50 mg via ORAL
  Filled 2013-12-23 (×3): qty 1

## 2013-12-23 MED ORDER — ONDANSETRON HCL 4 MG/2ML IJ SOLN
4.0000 mg | Freq: Four times a day (QID) | INTRAMUSCULAR | Status: DC | PRN
  Administered 2013-12-23: 4 mg via INTRAVENOUS
  Filled 2013-12-23: qty 2

## 2013-12-23 MED ORDER — LISINOPRIL 10 MG PO TABS
10.0000 mg | ORAL_TABLET | Freq: Every day | ORAL | Status: DC
  Administered 2013-12-23: 10 mg via ORAL
  Filled 2013-12-23: qty 1

## 2013-12-23 MED ORDER — HYDRALAZINE HCL 25 MG PO TABS
25.0000 mg | ORAL_TABLET | Freq: Three times a day (TID) | ORAL | Status: DC
  Administered 2013-12-23: 25 mg via ORAL
  Filled 2013-12-23 (×4): qty 1

## 2013-12-23 MED ORDER — CHLORDIAZEPOXIDE HCL 5 MG PO CAPS: 10.0000 mg | ORAL_CAPSULE | Freq: Three times a day (TID) | ORAL | Status: DC

## 2013-12-23 MED ORDER — HYDRALAZINE HCL 20 MG/ML IJ SOLN
10.0000 mg | Freq: Four times a day (QID) | INTRAMUSCULAR | Status: DC | PRN
  Filled 2013-12-23: qty 1

## 2013-12-23 NOTE — ED Notes (Signed)
Pt states he never signed AMA paperwork from [facility]; Pt states meds he was given was making him feel worse. Pt states he has HA and weakness.

## 2013-12-23 NOTE — Progress Notes (Signed)
[facility] hospital[facility] progress note. Chief complaint. Transfer note. History of present illness. This 43 year old male presented to [facility] long emergency room with headache and left-sided weakness. By the time the patient to was seen in the emergency room left-sided weakness that resolved the patient remained severely hypertensive with systolic blood pressure in the 160-220 range. Patient was felt to require transfer to [facility] for further stroke workup. I came to see the patient at bedside to assess his stability post transfer and to ensure that orders transferred appropriately. The patient has no complaints other than headache currently. Vital signs. Temperature 98.5, pulse 71, respiration 18, blood pressure 191/121. General appearance. Well-developed middle-aged male who is alert, cooperative, and in no distress. Cardiac. Rate and rhythm regular. Lungs. Breath sounds clear and equal. Abdomen. Soft with positive bowel sounds. No pain with palpation. Neurologic. Cranial nerves 2-12 grossly intact. Good strength in all 4 extremities 5/5. No unilateral or focal defects. Impression/plan. Problem #1. Intermittent left-sided weakness. Possibly migraine or also possible TIA. MRI pending. No current neurologic deficits found. Problem #2. Hypertensive urgency. Attempting to maintain permissive hypertension to current blood pressure far to elevated despite 5 mg of hydralazine. I have given an extra dose of hydralazine increased baseline when necessary hydralazine 10 mg every 6 hours when necessary. Patient appears clinically stable post transfer and all orders appear to transferred appropriately.

## 2013-12-23 NOTE — ED Notes (Signed)
MD at bedside. 

## 2013-12-23 NOTE — ED Notes (Signed)
Pt with increased HA and restlessness, BP elevated, hydralazine order released and admitting MD paged

## 2013-12-23 NOTE — ED Notes (Signed)
Pt. Seen at [facility] last night for headache, admitted for htn. D/c 1 hour ago. No neuro deficients. bp 200/102. Pt alert & oriented.

## 2013-12-23 NOTE — ED Notes (Signed)
Pt also reports weakness in L side x 2-3 weeks.

## 2013-12-23 NOTE — Consult Note (Signed)
Reason for Consult: Intermittent left-sided weakness for 2 weeks.  HPI:                                                                                                                                          Richard [name] is an 43 y.o. male with a history of posttraumatic brain injury with seizure disorder, asthma and hypertension, presenting with intermittent weakness involving his left side for 2 weeks. Last episode occurred yesterday. He also has been experiencing numbness involving his left side. No changes in speech were noted. His last seizure was 2-3 weeks ago. Dilantin level in the emergency room on 12/22/2013 was 2.7. Patient indicated he has been taking Dilantin 200 mg twice a day. He's been taking aspirin 325 mg per day for antiplatelet therapy. Systolic blood pressure has been elevated including systolic blood pressures greater than 200. Noncompliance with medication recommendations suspected. CT scan of his head showed encephalomalacia involving left frontal region. No acute changes were seen.  Past Medical History  Diagnosis Date  . Hypertension   . Seizures     last episode 03/2013  . Asthma   . Brain bleed     Past Surgical History  Procedure Laterality Date  . Leg surgery      Family History  Problem Relation Age of Onset  . Diabetes Mellitus II Sister   . Cancer Father     Social History:  reports that he has been smoking Cigarettes.  He has a 26 pack-year smoking history. He has never used smokeless tobacco. He reports that he drinks about 2.4 ounces of alcohol per week. He reports that he uses illicit drugs (Cocaine).  Allergies  Allergen Reactions  . Dilaudid [Hydromorphone Hcl] Other (See Comments)    Bradycardia and Nausea    MEDICATIONS:                                                                                                                     I have reviewed the patient's current medications.   ROS:  History obtained from the patient  General ROS: negative for - chills, fatigue, fever, night sweats, weight gain or weight loss Psychological ROS: negative for - behavioral disorder, hallucinations, memory difficulties, mood swings or suicidal ideation Ophthalmic ROS: negative for - blurry vision, double vision, eye pain or loss of vision ENT ROS: negative for - epistaxis, nasal discharge, oral lesions, sore throat, tinnitus or vertigo Allergy and Immunology ROS: negative for - hives or itchy/watery eyes Hematological and Lymphatic ROS: negative for - bleeding problems, bruising or swollen lymph nodes Endocrine ROS: negative for - galactorrhea, hair pattern changes, polydipsia/polyuria or temperature intolerance Respiratory ROS: negative for - cough, hemoptysis, shortness of breath or wheezing Cardiovascular ROS: negative for - chest pain, dyspnea on exertion, edema or irregular heartbeat Gastrointestinal ROS: negative for - abdominal pain, diarrhea, hematemesis, nausea/vomiting or stool incontinence Genito-Urinary ROS: negative for - dysuria, hematuria, incontinence or urinary frequency/urgency Musculoskeletal ROS: negative for - joint swelling or muscular weakness Neurological ROS: as noted in HPI Dermatological ROS: negative for rash and skin lesion changes   Blood pressure 172/101, pulse 50, temperature 97.5 F (36.4 C), temperature source Oral, resp. rate 13, height 5\' 9"  (1.753 m), weight 102.513 kg (226 lb), SpO2 99.00%.   Neurologic Examination:                                                                                                      Mental Status: Alert, oriented, complaining of headache.  Speech fluent without evidence of aphasia. Able to follow commands without difficulty. Cranial Nerves: II-Visual fields were normal. III/IV/VI-Pupils were equal and reacted. Extraocular movements  were full and conjugate.    V/VII-reduced perception of tactile sensation of the left side of face compared to the right; no facial weakness. VIII-normal. X-normal speech and symmetrical palatal movement. Motor: 5/5 bilaterally with normal tone and bulk, including no drift of left extremities. Sensory: Reduced perception of tactile sensation over left extremities compared to right extremities. Deep Tendon Reflexes: 2+ and symmetric. Plantars: Flexor bilaterally Cerebellar: Normal finger-to-nose testing.  Lab Results  Component Value Date/Time   CHOL 131 12/05/2013  4:05 PM    Results for orders placed during the hospital encounter of 12/22/13 (from the past 48 hour(s))  URINALYSIS, ROUTINE W REFLEX MICROSCOPIC     Status: Abnormal   Collection Time    12/22/13 10:33 PM      Result Value Range   Color, Urine YELLOW  YELLOW   APPearance CLOUDY (*) CLEAR   Specific Gravity, Urine 1.015  1.005 - 1.030   pH 7.5  5.0 - 8.0   Glucose, UA NEGATIVE  NEGATIVE mg/dL   Hgb urine dipstick NEGATIVE  NEGATIVE   Bilirubin Urine NEGATIVE  NEGATIVE   Ketones, ur NEGATIVE  NEGATIVE mg/dL   Protein, ur NEGATIVE  NEGATIVE mg/dL   Urobilinogen, UA 0.2  0.0 - 1.0 mg/dL   Nitrite NEGATIVE  NEGATIVE   Leukocytes, UA NEGATIVE  NEGATIVE   Comment: MICROSCOPIC NOT DONE ON URINES WITH NEGATIVE PROTEIN, BLOOD, LEUKOCYTES, NITRITE, OR GLUCOSE <1000 mg/dL.  URINE RAPID DRUG SCREEN (HOSP  PERFORMED)     Status: None   Collection Time    12/22/13 10:33 PM      Result Value Range   Opiates NONE DETECTED  NONE DETECTED   Cocaine NONE DETECTED  NONE DETECTED   Benzodiazepines NONE DETECTED  NONE DETECTED   Amphetamines NONE DETECTED  NONE DETECTED   Tetrahydrocannabinol NONE DETECTED  NONE DETECTED   Barbiturates NONE DETECTED  NONE DETECTED   Comment:            DRUG SCREEN FOR MEDICAL PURPOSES     ONLY.  IF CONFIRMATION IS NEEDED     FOR ANY PURPOSE, NOTIFY LAB     WITHIN 5 DAYS.                LOWEST  DETECTABLE LIMITS     FOR URINE DRUG SCREEN     Drug Class       Cutoff (ng/mL)     Amphetamine      1000     Barbiturate      200     Benzodiazepine   397     Tricyclics       673     Opiates          300     Cocaine          300     THC              50  CBC     Status: None   Collection Time    12/22/13 10:45 PM      Result Value Range   WBC 8.3  4.0 - 10.5 K/uL   RBC 4.96  4.22 - 5.81 MIL/uL   Hemoglobin 15.2  13.0 - 17.0 g/dL   HCT 45.3  39.0 - 52.0 %   MCV 91.3  78.0 - 100.0 fL   MCH 30.6  26.0 - 34.0 pg   MCHC 33.6  30.0 - 36.0 g/dL   RDW 14.0  11.5 - 15.5 %   Platelets 319  150 - 400 K/uL  DIFFERENTIAL     Status: None   Collection Time    12/22/13 10:45 PM      Result Value Range   Neutrophils Relative % 62  43 - 77 %   Neutro Abs 5.2  1.7 - 7.7 K/uL   Lymphocytes Relative 28  12 - 46 %   Lymphs Abs 2.3  0.7 - 4.0 K/uL   Monocytes Relative 10  3 - 12 %   Monocytes Absolute 0.8  0.1 - 1.0 K/uL   Eosinophils Relative 0  0 - 5 %   Eosinophils Absolute 0.0  0.0 - 0.7 K/uL   Basophils Relative 0  0 - 1 %   Basophils Absolute 0.0  0.0 - 0.1 K/uL  COMPREHENSIVE METABOLIC PANEL     Status: Abnormal   Collection Time    12/22/13 10:45 PM      Result Value Range   Sodium 137  137 - 147 mEq/L   Potassium 4.4  3.7 - 5.3 mEq/L   Chloride 99  96 - 112 mEq/L   CO2 26  19 - 32 mEq/L   Glucose, Bld 94  70 - 99 mg/dL   BUN 13  6 - 23 mg/dL   Creatinine, Ser 0.92  0.50 - 1.35 mg/dL   Calcium 9.4  8.4 - 10.5 mg/dL   Total Protein 7.4  6.0 - 8.3 g/dL   Albumin 3.8  3.5 - 5.2 g/dL   AST 17  0 - 37 U/L   Comment: SLIGHT HEMOLYSIS     HEMOLYSIS AT THIS LEVEL MAY AFFECT RESULT   ALT 16  0 - 53 U/L   Alkaline Phosphatase 92  39 - 117 U/L   Total Bilirubin <0.2 (*) 0.3 - 1.2 mg/dL   GFR calc non Af Amer >90  >90 mL/min   GFR calc Af Amer >90  >90 mL/min   Comment: (NOTE)     The eGFR has been calculated using the CKD EPI equation.     This calculation has not been validated  in all clinical situations.     eGFR's persistently <90 mL/min signify possible Chronic Kidney     Disease.  TROPONIN I     Status: None   Collection Time    12/22/13 10:45 PM      Result Value Range   Troponin I <0.30  <0.30 ng/mL   Comment:            Due to the release kinetics of cTnI,     a negative result within the first hours     of the onset of symptoms does not rule out     myocardial infarction with certainty.     If myocardial infarction is still suspected,     repeat the test at appropriate intervals.  ETHANOL     Status: None   Collection Time    12/22/13 10:45 PM      Result Value Range   Alcohol, Ethyl (B) <11  0 - 11 mg/dL   Comment:            LOWEST DETECTABLE LIMIT FOR     SERUM ALCOHOL IS 11 mg/dL     FOR MEDICAL PURPOSES ONLY  PROTIME-INR     Status: None   Collection Time    12/22/13 10:45 PM      Result Value Range   Prothrombin Time 11.7  11.6 - 15.2 seconds   INR 0.87  0.00 - 1.49  APTT     Status: None   Collection Time    12/22/13 10:45 PM      Result Value Range   aPTT 29  24 - 37 seconds  PHENYTOIN LEVEL, TOTAL     Status: Abnormal   Collection Time    12/23/13 12:58 AM      Result Value Range   Phenytoin Lvl 2.7 (*) 10.0 - 20.0 ug/mL   Comment: Performed at [facility]  MRSA PCR SCREENING     Status: None   Collection Time    12/23/13  5:55 AM      Result Value Range   MRSA by PCR NEGATIVE  NEGATIVE   Comment:            The GeneXpert MRSA Assay (FDA     approved for NASAL specimens     only), is one component of a     comprehensive MRSA colonization     surveillance program. It is not     intended to diagnose MRSA     infection nor to guide or     monitor treatment for     MRSA infections.    Ct Head Wo Contrast  12/22/2013   CLINICAL DATA:  Headache.  EXAM: CT HEAD WITHOUT CONTRAST  TECHNIQUE: Contiguous axial images were obtained from the base of the skull through the vertex without intravenous contrast.  COMPARISON:   Head CT  scan 12/06/2013.  FINDINGS: Remote left frontal infarct is again seen. There is some chronic microvascular ischemic change. No evidence of acute abnormality including infarction, hemorrhage, mass lesion, mass effect, midline shift or abnormal extra-axial fluid collection. No hydrocephalus or pneumocephalus. Chronic left maxillary sinus disease appears unchanged.  IMPRESSION: No acute finding.  Remote left frontal infarct and chronic microvascular ischemic change.  Chronic left maxillary sinusitis.   Electronically Signed   By: Inge Rise M.D.   On: 12/22/2013 23:30   Assessment/Plan: 43 year old man with posttraumatic brain injury and seizure disorder as well as hypertension presenting with history of intermittent weakness and numbness involving extremities on the left. Examination showed no objective focal neurologic deficit. Intermittent TIAs cannot be ruled out, particularly with hypertension and family history of stroke. Compliance with taking medication, including blood pressure medications and AED suspected.  Recommendations: 1. Continue aspirin 325 mg per day 2. MRI and MRA of the brain without contrast to rule out signs of acute stroke 3. Hemoglobin A1c and fasting lipid panel 4. Carotid Doppler study 5. 2-D echocardiogram 6. I will give her one-time dose of Dilantin 500 mg IV and continue maintenance at 200 mg twice a day. 7. Repeat Dilantin level in the a.m.  We will continue to follow this patient with you.  C.R. Nicole Kindred, MD [facility] [phone]  12/23/2013, 8:46 AM

## 2013-12-23 NOTE — ED Provider Notes (Signed)
Medical screening examination/treatment/procedure(s) were conducted as a [id number] shared visit with non-physician practitioner(s) and myself.  I personally evaluated the patient during the encounter.  EKG Interpretation    Date/Time:    Ventricular Rate:    PR Interval:    QRS Duration:   QT Interval:    QTC Calculation:   R Axis:     Text Interpretation:             2 months intermittent headaches with left sided visual loss and left face/arm/leg weak and numb, spells getting more frequent and intense, 3 spells today, 3 other spells in about a week, about 3 spells in prior 2 months, spells were several minutes now still less than an hour, but longer than a few minutes, last known well 8pm, then had spell, now asymptomatic.  Mental status awake and alert with normal speech, major cranial nerves appear intact, no nystagmus, normal LT, 5/5 motor bilat without drift, normal F-N bilat and normal gait.  Concern TIAs vs complicated migraines request Obs.  Babette Relic, MD 12/23/13 1245

## 2013-12-23 NOTE — Discharge Summary (Signed)
Patient at this time expresses desire to leave the Hospital immidiately, patient has been warned that this is not Medically advisable at this time, and can result in Medical complications like Stroke, Death and Disability, patient understands and accepts the risks involved and assumes full responsibilty of this decision.       Please my last note below from today for all the details of care provided by me so far.    [facility]                                                                                Patient Demographics  Richard [name], is a 43 y.o. male, DOB - 31-Aug-1971, GUR:[id number]  Admit date - 12/22/2013   Admitting Physician Annita Brod, MD  Outpatient Primary MD for the patient is [name], MD  LOS - 1   Chief Complaint  Patient presents with  . Headache  . Hypertension        Assessment & Plan    1. ? Intermittent Left-sided weakness ongoing for the last 10 days. Question TIA/CVA versus hypertensive encephalopathy. Continue one aspirin, head CT was stable, full stroke workup is underway which includes MRI MRA brain, echogram, carotid duplex, A1c and lipid panel. Will be evaluated by PT OT. Neurology to see.  No results found for this basename: HGBA1C    Lab Results  Component Value Date   CHOL 146 12/23/2013   HDL 53 12/23/2013   LDLCALC 80 12/23/2013   TRIG 65 12/23/2013   CHOLHDL 2.8 12/23/2013      2. Hypertensive urgency/crisis. His neurological symptoms have been ongoing for close to 10 days - we'll start to titrate his medications and bring his blood pressure down, have started him on hydralazine, Norvasc, ACE and monitor. As needed Catapres.    3. History of seizure disorder. Continue home dose Dilantin, Dilantin levels would low and below with one dose of IV Dilantin.   4. Headaches secondary to hypertensive crisis. Supportive care and blood pressure control   5. History of alcohol and cocaine abuse. Says he has quit both for  the last several months. Urine drug screen is negative. Will put him on low-dose Librium along with folic acid and thiamine.   6. Ongoing smoking. Counseled to quit smoking.     Code Status: Full  Family Communication:   Disposition Plan: Home   Procedures  CT scan of the head, MRI MRA brain, echo, carotid duplex.   Consults Neuro   Medications  Scheduled Meds: . amLODipine  10 mg Oral Daily  . aspirin  325 mg Oral Daily  . chlordiazePOXIDE  10 mg Oral TID  . folic acid  1 mg Oral Daily  . hydrALAZINE  100 mg Oral Q8H  . ibuprofen  600 mg Oral Once  . lisinopril  10 mg Oral Once  . [START ON 12/24/2013] lisinopril  20 mg Oral Daily  . phenytoin  300 mg Oral Daily  . thiamine  100 mg Oral Daily   Continuous Infusions:  PRN Meds:.acetaminophen, albuterol, cloNIDine, hydrALAZINE, ondansetron (ZOFRAN) IV, traMADol  DVT Prophylaxis    SCDs   Lab Results  Component Value Date   PLT  319 12/22/2013    Antibiotics     Anti-infectives   None          Subjective:   Richard [name] today has, + headache and stable mild L sided weakness, No chest pain, No abdominal pain - No Nausea, No new weakness tingling or numbness, No Cough - SOB.    Objective:   Filed Vitals:   12/23/13 0809 12/23/13 1100 12/23/13 1410 12/23/13 1500  BP: 172/101 180/118 176/128 171/100  Pulse:    85  Temp: 97.5 F (36.4 C) 97.7 F (36.5 C)  97.5 F (36.4 C)  TempSrc:  Oral  Oral  Resp:    18  Height:      Weight:      SpO2:        Wt Readings from Last 3 Encounters:  12/22/13 102.513 kg (226 lb)  12/22/13 102.513 kg (226 lb)  12/05/13 102.241 kg (225 lb 6.4 oz)     Intake/Output Summary (Last 24 hours) at 12/23/13 1851 Last data filed at 12/23/13 1700  Gross per 24 hour  Intake    110 ml  Output   1325 ml  Net  -1215 ml    Exam Awake Alert, Oriented X 3, No new F.N deficits, Normal affect, ? Mild L sided weakness and facial droop as compared to the  left [place].AT,PERRAL Supple Neck,No JVD, No cervical lymphadenopathy appriciated.  Symmetrical Chest wall movement, Good air movement bilaterally, CTAB RRR,No Gallops,Rubs or new Murmurs, No Parasternal Heave +ve B.Sounds, Abd Soft, Non tender, No organomegaly appriciated, No rebound - guarding or rigidity. No Cyanosis, Clubbing or edema, No new Rash or bruise    Data Review   Micro Results Recent Results (from the past 240 hour(s))  MRSA PCR SCREENING     Status: None   Collection Time    12/23/13  5:55 AM      Result Value Range Status   MRSA by PCR NEGATIVE  NEGATIVE Final   Comment:            The GeneXpert MRSA Assay (FDA     approved for NASAL specimens     only), is one component of a     comprehensive MRSA colonization     surveillance program. It is not     intended to diagnose MRSA     infection nor to guide or     monitor treatment for     MRSA infections.    Radiology Reports Ct Head Wo Contrast  12/22/2013   CLINICAL DATA:  Headache.  EXAM: CT HEAD WITHOUT CONTRAST  TECHNIQUE: Contiguous axial images were obtained from the base of the skull through the vertex without intravenous contrast.  COMPARISON:  Head CT scan 12/06/2013.  FINDINGS: Remote left frontal infarct is again seen. There is some chronic microvascular ischemic change. No evidence of acute abnormality including infarction, hemorrhage, mass lesion, mass effect, midline shift or abnormal extra-axial fluid collection. No hydrocephalus or pneumocephalus. Chronic left maxillary sinus disease appears unchanged.  IMPRESSION: No acute finding.  Remote left frontal infarct and chronic microvascular ischemic change.  Chronic left maxillary sinusitis.   Electronically Signed   By: Inge Rise M.D.   On: 12/22/2013 23:30   Ct Head Wo Contrast  12/06/2013   CLINICAL DATA:  History of seizures.  Syncope.  EXAM: CT HEAD WITHOUT CONTRAST  TECHNIQUE: Contiguous axial images were obtained from the base of the skull  through the vertex without intravenous contrast.  COMPARISON:  Head CT  scan 10/29/2012 and 06/17/2011.  FINDINGS: Remote left frontal infarct is seen. There is some chronic microvascular ischemic change. No evidence of acute abnormality including infarction, hemorrhage, mass lesion, mass effect, midline shift or abnormal extra-axial fluid collection. There is chronic left maxillary sinus disease with complete opacification of the visualized left maxillary sinus and marked wall thickening. A small amount of mastoid fluid is seen bilaterally.  IMPRESSION: No acute finding.  Remote left frontal infarct.  Chronic left maxillary sinus disease.   Electronically Signed   By: Inge Rise M.D.   On: 12/06/2013 19:36    CBC  Recent Labs Lab 12/22/13 2245  WBC 8.3  HGB 15.2  HCT 45.3  PLT 319  MCV 91.3  MCH 30.6  MCHC 33.6  RDW 14.0  LYMPHSABS 2.3  MONOABS 0.8  EOSABS 0.0  BASOSABS 0.0    Chemistries   Recent Labs Lab 12/22/13 2245  NA 137  K 4.4  CL 99  CO2 26  GLUCOSE 94  BUN 13  CREATININE 0.92  CALCIUM 9.4  AST 17  ALT 16  ALKPHOS 92  BILITOT <0.2*   ------------------------------------------------------------------------------------------------------------------ estimated creatinine clearance is 122.1 ml/min (by C-G formula based on Cr of 0.92). ------------------------------------------------------------------------------------------------------------------ No results found for this basename: HGBA1C,  in the last 72 hours ------------------------------------------------------------------------------------------------------------------  Recent Labs  12/23/13 0750  CHOL 146  HDL 53  LDLCALC 80  TRIG 65  CHOLHDL 2.8   ------------------------------------------------------------------------------------------------------------------ No results found for this basename: TSH, T4TOTAL, FREET3, T3FREE, THYROIDAB,  in the last 72  hours ------------------------------------------------------------------------------------------------------------------ No results found for this basename: VITAMINB12, FOLATE, FERRITIN, TIBC, IRON, RETICCTPCT,  in the last 72 hours  Coagulation profile  Recent Labs Lab 12/22/13 2245  INR 0.87    No results found for this basename: DDIMER,  in the last 72 hours  Cardiac Enzymes  Recent Labs Lab 12/22/13 2245  TROPONINI <0.30   ------------------------------------------------------------------------------------------------------------------ No components found with this basename: POCBNP,      Time Spent in minutes  35   SINGH,PRASHANT K M.D on 12/23/2013 at 6:50 PM  Between 7am to 7pm - Pager - [phone]  After 7pm go to [email or web address] - password [email or web address]  And look for the night coverage person covering for me after hours  [facility] Office  [phone]

## 2013-12-23 NOTE — Progress Notes (Signed)
[facility]                                                                                Patient Demographics  Richard [name], is a 43 y.o. male, DOB - 1971/05/18, KXF:[id number]  Admit date - 12/22/2013   Admitting Physician Annita Brod, MD  Outpatient Primary MD for the patient is [name], MD  LOS - 1   Chief Complaint  Patient presents with  . Headache  . Hypertension        Assessment & Plan    1. ? Intermittent Left-sided weakness ongoing for the last 10 days. Question TIA/CVA versus hypertensive encephalopathy. Continue one aspirin, head CT was stable, full stroke workup is underway which includes MRI MRA brain, echogram, carotid duplex, A1c and lipid panel. Will be evaluated by PT OT. Neurology to see.  No results found for this basename: HGBA1C    Lab Results  Component Value Date   CHOL 146 12/23/2013   HDL 53 12/23/2013   LDLCALC 80 12/23/2013   TRIG 65 12/23/2013   CHOLHDL 2.8 12/23/2013      2. Hypertensive urgency/crisis. His neurological symptoms have been ongoing for close to 10 days - we'll start to titrate his medications and bring his blood pressure down, have started him on hydralazine, Norvasc, ACE and monitor. As needed Catapres.    3. History of seizure disorder. Continue home dose Dilantin, Dilantin levels would low and below with one dose of IV Dilantin.   4. Headaches secondary to hypertensive crisis. Supportive care and blood pressure control   5. History of alcohol and cocaine abuse. Says he has quit both for the last several months. Urine drug screen is negative. Will put him on low-dose Librium along with folic acid and thiamine.   6. Ongoing smoking. Counseled to quit smoking.     Code Status: Full  Family Communication:   Disposition Plan: Home   Procedures  CT scan of the head, MRI MRA brain, echo, carotid duplex.   Consults Neuro   Medications  Scheduled Meds: . amLODipine  10 mg Oral Daily  .  aspirin  325 mg Oral Daily  . chlordiazePOXIDE  10 mg Oral TID  . folic acid  1 mg Oral Daily  . hydrALAZINE  50 mg Oral Q8H  . lisinopril  10 mg Oral Daily  . phenytoin (DILANTIN) IV  500 mg Intravenous Once  . phenytoin  300 mg Oral Daily  . thiamine  100 mg Oral Daily   Continuous Infusions:  PRN Meds:.acetaminophen, albuterol, cloNIDine, hydrALAZINE, traMADol  DVT Prophylaxis    SCDs   Lab Results  Component Value Date   PLT 319 12/22/2013    Antibiotics     Anti-infectives   None          Subjective:   Richard [name] today has, + headache and stable mild L sided weakness, No chest pain, No abdominal pain - No Nausea, No new weakness tingling or numbness, No Cough - SOB.    Objective:   Filed Vitals:   12/23/13 0645 12/23/13 0700 12/23/13 0800 12/23/13 0809  BP: 191/116 183/116 172/101 172/101  Pulse: 63 50  Temp:  97.5 F (36.4 C) 97.5 F (36.4 C) 97.5 F (36.4 C)  TempSrc:  Oral Oral   Resp: 21 18 13    Height:      Weight:      SpO2: 98% 99%      Wt Readings from Last 3 Encounters:  12/22/13 102.513 kg (226 lb)  12/22/13 102.513 kg (226 lb)  12/05/13 102.241 kg (225 lb 6.4 oz)    No intake or output data in the 24 hours ending 12/23/13 0932  Exam Awake Alert, Oriented X 3, No new F.N deficits, Normal affect, ? Mild L sided weakness and facial droop as compared to the left [place].AT,PERRAL Supple Neck,No JVD, No cervical lymphadenopathy appriciated.  Symmetrical Chest wall movement, Good air movement bilaterally, CTAB RRR,No Gallops,Rubs or new Murmurs, No Parasternal Heave +ve B.Sounds, Abd Soft, Non tender, No organomegaly appriciated, No rebound - guarding or rigidity. No Cyanosis, Clubbing or edema, No new Rash or bruise    Data Review   Micro Results Recent Results (from the past 240 hour(s))  MRSA PCR SCREENING     Status: None   Collection Time    12/23/13  5:55 AM      Result Value Range Status   MRSA by PCR NEGATIVE  NEGATIVE Final    Comment:            The GeneXpert MRSA Assay (FDA     approved for NASAL specimens     only), is one component of a     comprehensive MRSA colonization     surveillance program. It is not     intended to diagnose MRSA     infection nor to guide or     monitor treatment for     MRSA infections.    Radiology Reports Ct Head Wo Contrast  12/22/2013   CLINICAL DATA:  Headache.  EXAM: CT HEAD WITHOUT CONTRAST  TECHNIQUE: Contiguous axial images were obtained from the base of the skull through the vertex without intravenous contrast.  COMPARISON:  Head CT scan 12/06/2013.  FINDINGS: Remote left frontal infarct is again seen. There is some chronic microvascular ischemic change. No evidence of acute abnormality including infarction, hemorrhage, mass lesion, mass effect, midline shift or abnormal extra-axial fluid collection. No hydrocephalus or pneumocephalus. Chronic left maxillary sinus disease appears unchanged.  IMPRESSION: No acute finding.  Remote left frontal infarct and chronic microvascular ischemic change.  Chronic left maxillary sinusitis.   Electronically Signed   By: Inge Rise M.D.   On: 12/22/2013 23:30   Ct Head Wo Contrast  12/06/2013   CLINICAL DATA:  History of seizures.  Syncope.  EXAM: CT HEAD WITHOUT CONTRAST  TECHNIQUE: Contiguous axial images were obtained from the base of the skull through the vertex without intravenous contrast.  COMPARISON:  Head CT scan 10/29/2012 and 06/17/2011.  FINDINGS: Remote left frontal infarct is seen. There is some chronic microvascular ischemic change. No evidence of acute abnormality including infarction, hemorrhage, mass lesion, mass effect, midline shift or abnormal extra-axial fluid collection. There is chronic left maxillary sinus disease with complete opacification of the visualized left maxillary sinus and marked wall thickening. A small amount of mastoid fluid is seen bilaterally.  IMPRESSION: No acute finding.  Remote left frontal  infarct.  Chronic left maxillary sinus disease.   Electronically Signed   By: Inge Rise M.D.   On: 12/06/2013 19:36    CBC  Recent Labs Lab 12/22/13 2245  WBC 8.3  HGB 15.2  HCT 45.3  PLT 319  MCV 91.3  MCH 30.6  MCHC 33.6  RDW 14.0  LYMPHSABS 2.3  MONOABS 0.8  EOSABS 0.0  BASOSABS 0.0    Chemistries   Recent Labs Lab 12/22/13 2245  NA 137  K 4.4  CL 99  CO2 26  GLUCOSE 94  BUN 13  CREATININE 0.92  CALCIUM 9.4  AST 17  ALT 16  ALKPHOS 92  BILITOT <0.2*   ------------------------------------------------------------------------------------------------------------------ estimated creatinine clearance is 122.1 ml/min (by C-G formula based on Cr of 0.92). ------------------------------------------------------------------------------------------------------------------ No results found for this basename: HGBA1C,  in the last 72 hours ------------------------------------------------------------------------------------------------------------------  Recent Labs  12/23/13 0750  CHOL 146  HDL 53  LDLCALC 80  TRIG 65  CHOLHDL 2.8   ------------------------------------------------------------------------------------------------------------------ No results found for this basename: TSH, T4TOTAL, FREET3, T3FREE, THYROIDAB,  in the last 72 hours ------------------------------------------------------------------------------------------------------------------ No results found for this basename: VITAMINB12, FOLATE, FERRITIN, TIBC, IRON, RETICCTPCT,  in the last 72 hours  Coagulation profile  Recent Labs Lab 12/22/13 2245  INR 0.87    No results found for this basename: DDIMER,  in the last 72 hours  Cardiac Enzymes  Recent Labs Lab 12/22/13 2245  TROPONINI <0.30   ------------------------------------------------------------------------------------------------------------------ No components found with this basename: POCBNP,      Time Spent in  minutes  35   Kariah Loredo K M.D on 12/23/2013 at 9:32 AM  Between 7am to 7pm - Pager - [phone]  After 7pm go to [email or web address] - password [email or web address]  And look for the night coverage person covering for me after hours  [facility] Office  [phone]

## 2013-12-23 NOTE — ED Provider Notes (Signed)
Medical screening examination/treatment/procedure(s) were conducted as a shared visit with non-physician practitioner(s) and myself.  I personally evaluated the patient during the encounter.  EKG Interpretation    Date/Time:    Ventricular Rate:    PR Interval:    QRS Duration:   QT Interval:    QTC Calculation:   R Axis:     Text Interpretation:             See ED Note entered by me for this patient's [id number] ED visit.  Babette Relic, MD 12/23/13 [phone]
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
## 2013-12-23 NOTE — ED Notes (Signed)
Pt more relaxed, HA improved, calmer and more comfortable

## 2013-12-23 NOTE — ED Notes (Signed)
Called bed control to check on pt's bed, sts pt should have bed in less than 1 hour

## 2013-12-23 NOTE — ED Notes (Signed)
Pt reports some relief with O2

## 2013-12-23 NOTE — Progress Notes (Signed)
VASCULAR LAB PRELIMINARY  PRELIMINARY  PRELIMINARY  PRELIMINARY  Carotid Dopplers completed.  Preliminary report:  1-39% ICA stenosis.  Vertebral artery flow antegrade.  Arber Wiemers, RVT 12/23/2013, 11:27 AM

## 2013-12-23 NOTE — Progress Notes (Signed)
Echo Lab  2D Echocardiogram completed.  [place], RDCS 12/23/2013 10:33 AM

## 2013-12-23 NOTE — Progress Notes (Signed)
Pt has threatened multiple times over this shift to leave the hospital and not to take his medications. This RN has been able to persuade him multiple times stay and take his medications. This time he c/o pain but refused to take what was ordered. He then stated "I am leaving and not taking any more medications". The MD was called, informed and AMA form was singed. All lines were removed, pt dressed himself, collected his belongings and exited the building.

## 2013-12-24 ENCOUNTER — Encounter (HOSPITAL_COMMUNITY): Payer: Self-pay | Admitting: Internal Medicine

## 2013-12-24 DIAGNOSIS — G459 Transient cerebral ischemic attack, unspecified: Secondary | ICD-10-CM | POA: Diagnosis present

## 2013-12-24 DIAGNOSIS — M6281 Muscle weakness (generalized): Secondary | ICD-10-CM

## 2013-12-24 DIAGNOSIS — I10 Essential (primary) hypertension: Principal | ICD-10-CM

## 2013-12-24 DIAGNOSIS — Z7982 Long term (current) use of aspirin: Secondary | ICD-10-CM | POA: Diagnosis not present

## 2013-12-24 DIAGNOSIS — J45909 Unspecified asthma, uncomplicated: Secondary | ICD-10-CM | POA: Diagnosis not present

## 2013-12-24 DIAGNOSIS — Z87820 Personal history of traumatic brain injury: Secondary | ICD-10-CM | POA: Diagnosis not present

## 2013-12-24 DIAGNOSIS — G43809 Other migraine, not intractable, without status migrainosus: Secondary | ICD-10-CM

## 2013-12-24 DIAGNOSIS — F17200 Nicotine dependence, unspecified, uncomplicated: Secondary | ICD-10-CM | POA: Diagnosis not present

## 2013-12-24 DIAGNOSIS — R51 Headache: Secondary | ICD-10-CM

## 2013-12-24 DIAGNOSIS — G40909 Epilepsy, unspecified, not intractable, without status epilepticus: Secondary | ICD-10-CM | POA: Diagnosis not present

## 2013-12-24 DIAGNOSIS — R29898 Other symptoms and signs involving the musculoskeletal system: Secondary | ICD-10-CM | POA: Diagnosis not present

## 2013-12-24 LAB — LIPID PANEL
Cholesterol: 138 mg/dL (ref 0–200)
HDL: 45 mg/dL (ref >39)
LDL CALC: 80 mg/dL (ref 0–99)
Total CHOL/HDL Ratio: 3.1 RATIO
Triglycerides: 65 mg/dL (ref <150)
VLDL: 13 mg/dL (ref 0–40)

## 2013-12-24 LAB — GLUCOSE, CAPILLARY
GLUCOSE-CAPILLARY: 104 mg/dL — AB (ref 70–99)
Glucose-Capillary: 107 mg/dL — ABNORMAL HIGH (ref 70–99)
Glucose-Capillary: 103 mg/dL — ABNORMAL HIGH (ref 70–99)
Glucose-Capillary: 83 mg/dL (ref 70–99)

## 2013-12-24 LAB — RAPID URINE DRUG SCREEN, HOSP PERFORMED
AMPHETAMINES: NOT DETECTED
BENZODIAZEPINES: POSITIVE — AB
Barbiturates: NOT DETECTED
COCAINE: NOT DETECTED
OPIATES: NOT DETECTED
TETRAHYDROCANNABINOL: NOT DETECTED

## 2013-12-24 LAB — HEMOGLOBIN A1C
Hgb A1c MFr Bld: 6.1 % — ABNORMAL HIGH (ref <5.7)
MEAN PLASMA GLUCOSE: 128 mg/dL — AB (ref <117)

## 2013-12-24 MED ORDER — ASPIRIN EC 325 MG PO TBEC
325.0000 mg | DELAYED_RELEASE_TABLET | Freq: Every day | ORAL | Status: DC
  Administered 2013-12-24 – 2013-12-25 (×2): 325 mg via ORAL
  Filled 2013-12-24 (×2): qty 1

## 2013-12-24 MED ORDER — LABETALOL HCL 5 MG/ML IV SOLN
10.0000 mg | INTRAVENOUS | Status: DC | PRN
  Administered 2013-12-24: 10 mg via INTRAVENOUS
  Filled 2013-12-24: qty 4

## 2013-12-24 MED ORDER — AMLODIPINE BESYLATE 10 MG PO TABS
10.0000 mg | ORAL_TABLET | Freq: Every day | ORAL | Status: DC
  Administered 2013-12-25: 10 mg via ORAL
  Filled 2013-12-24: qty 1

## 2013-12-24 MED ORDER — PHENYTOIN SODIUM EXTENDED 100 MG PO CAPS
300.0000 mg | ORAL_CAPSULE | Freq: Every day | ORAL | Status: DC
  Administered 2013-12-24 – 2013-12-25 (×2): 300 mg via ORAL
  Filled 2013-12-24 (×2): qty 3

## 2013-12-24 MED ORDER — NICOTINE 21 MG/24HR TD PT24
21.0000 mg | MEDICATED_PATCH | Freq: Every day | TRANSDERMAL | Status: DC
  Administered 2013-12-24 – 2013-12-25 (×2): 21 mg via TRANSDERMAL
  Filled 2013-12-24 (×2): qty 1

## 2013-12-24 MED ORDER — AMLODIPINE BESYLATE 2.5 MG PO TABS
2.5000 mg | ORAL_TABLET | Freq: Every day | ORAL | Status: DC
  Administered 2013-12-24: 2.5 mg via ORAL
  Filled 2013-12-24: qty 1

## 2013-12-24 MED ORDER — ALBUTEROL SULFATE (2.5 MG/3ML) 0.083% IN NEBU: 2.5000 mg | INHALATION_SOLUTION | Freq: Four times a day (QID) | RESPIRATORY_TRACT | Status: DC | PRN

## 2013-12-24 MED ORDER — ASPIRIN BUFFERED 325 MG PO TABS: 325.0000 mg | ORAL_TABLET | Freq: Every day | ORAL | Status: DC

## 2013-12-24 MED ORDER — ATENOLOL 25 MG PO TABS
25.0000 mg | ORAL_TABLET | Freq: Every day | ORAL | Status: DC
  Administered 2013-12-24 – 2013-12-25 (×2): 25 mg via ORAL
  Filled 2013-12-24 (×2): qty 1

## 2013-12-24 MED ORDER — ALBUTEROL SULFATE HFA 108 (90 BASE) MCG/ACT IN AERS: 1.0000 | INHALATION_SPRAY | Freq: Four times a day (QID) | RESPIRATORY_TRACT | Status: DC | PRN

## 2013-12-24 MED ORDER — RAMIPRIL 5 MG PO CAPS
5.0000 mg | ORAL_CAPSULE | Freq: Every day | ORAL | Status: DC
  Administered 2013-12-24 – 2013-12-25 (×2): 5 mg via ORAL
  Filled 2013-12-24 (×2): qty 1

## 2013-12-24 MED ORDER — ATENOLOL 50 MG PO TABS
50.0000 mg | ORAL_TABLET | Freq: Every day | ORAL | Status: DC
  Filled 2013-12-24: qty 1

## 2013-12-24 MED ORDER — HYDRALAZINE HCL 50 MG PO TABS
50.0000 mg | ORAL_TABLET | Freq: Three times a day (TID) | ORAL | Status: DC
  Administered 2013-12-24 – 2013-12-25 (×3): 50 mg via ORAL
  Filled 2013-12-24 (×6): qty 1

## 2013-12-24 MED ORDER — HYDRALAZINE HCL 50 MG PO TABS
100.0000 mg | ORAL_TABLET | Freq: Three times a day (TID) | ORAL | Status: DC
  Filled 2013-12-24 (×4): qty 2

## 2013-12-24 MED ORDER — TRAMADOL HCL 50 MG PO TABS
50.0000 mg | ORAL_TABLET | Freq: Four times a day (QID) | ORAL | Status: DC | PRN
  Administered 2013-12-24 – 2013-12-25 (×4): 50 mg via ORAL
  Filled 2013-12-24 (×4): qty 1

## 2013-12-24 NOTE — ED Provider Notes (Signed)
CSN: [id number]     Arrival date & time 12/23/13  2028 History   First MD Initiated Contact with Patient 12/23/13 2337     Chief Complaint  Patient presents with  . Headache   (Consider location/radiation/quality/duration/timing/severity/associated sxs/prior Treatment) HPI 43 year old male presents to emergency apartment with complaint of headache, diaphoresis, feeling bad.  Pt left the stepdown unit tonight around 7 pm AMA, and while waiting for cab home, became sweaty, developed headache and checked back into the ER.  Pt was admitted last night to [facility] due to hypertensive urgency.  He also was having w/u for possible TIA due to 2 weeks of intermittent left sided weakness.  Pt had not yet completed his neuro workup.  Pt reports he was feeling better, so left AMA.  He is now amenable to completing his workup.     Past Medical History  Diagnosis Date  . Hypertension   . Seizures     last episode 03/2013  . Asthma   . Brain bleed    Past Surgical History  Procedure Laterality Date  . Leg surgery     Family History  Problem Relation Age of Onset  . Diabetes Mellitus II Sister   . Cancer Father    History  Substance Use Topics  . Smoking status: Current Every Day Smoker -- 1.00 packs/day for 26 years    Types: Cigarettes  . Smokeless tobacco: Never Used  . Alcohol Use: 2.4 oz/week    4 Cans of beer per week     Comment: drinks heavily. "pt sts he only drinks one beer a day now"    Review of Systems  See History of Present Illness; otherwise all other systems are reviewed and negative Allergies  Dilaudid  Home Medications   Current Outpatient Rx  Name  Route  Sig  Dispense  Refill  . amLODipine (NORVASC) 2.5 MG tablet   Oral   Take 1 tablet (2.5 mg total) by mouth daily.   30 tablet   0   . Aspirin-Acetaminophen-Caffeine (GOODYS EXTRA STRENGTH PO)   Oral   Take 1 Package by mouth every 8 (eight) hours as needed (headache or pain).          Marland Kitchen PROAIR HFA 108 (90  BASE) MCG/ACT inhaler   Inhalation   Inhale 1-2 puffs into the lungs every 6 (six) hours as needed for wheezing or shortness of breath.          Marland Kitchen aspirin 325 MG buffered tablet   Oral   Take 325 mg by mouth daily.         Marland Kitchen atenolol (TENORMIN) 50 MG tablet   Oral   Take 1 tablet (50 mg total) by mouth daily.   30 tablet   3   . phenytoin (DILANTIN) 100 MG ER capsule   Oral   Take 3 capsules (300 mg total) by mouth daily.   90 capsule   12   . ramipril (ALTACE) 5 MG capsule   Oral   Take 1 capsule (5 mg total) by mouth daily.   90 capsule   3   . traMADol (ULTRAM) 50 MG tablet   Oral   Take 50 mg by mouth every 6 (six) hours as needed for severe pain.         . Vitamin D, Ergocalciferol, (DRISDOL) 50000 UNITS CAPS capsule   Oral   Take 1 capsule (50,000 Units total) by mouth every 7 (seven) days.   12 capsule  0    BP 171/122  Pulse 79  Temp(Src) 97.7 F (36.5 C) (Oral)  Resp 16  SpO2 95% Physical Exam  Nursing note and vitals reviewed. Constitutional: He is oriented to person, place, and time. He appears well-developed and well-nourished. He appears distressed (uncomfortable appearing).  HENT:  Head: Normocephalic and atraumatic.  Right Ear: External ear normal.  Left Ear: External ear normal.  Nose: Nose normal.  Mouth/Throat: Oropharynx is clear and moist.  Eyes: Conjunctivae and EOM are normal. Pupils are equal, round, and reactive to light.  Neck: Normal range of motion. Neck supple. No JVD present. No tracheal deviation present. No thyromegaly present.  Cardiovascular: Normal rate, regular rhythm, normal heart sounds and intact distal pulses.  Exam reveals no gallop and no friction rub.   No murmur heard. Pulmonary/Chest: Effort normal and breath sounds normal. No stridor. No respiratory distress. He has no wheezes. He has no rales. He exhibits no tenderness.  Abdominal: Soft. Bowel sounds are normal. He exhibits no distension and no mass. There  is no tenderness. There is no rebound and no guarding.  Musculoskeletal: Normal range of motion. He exhibits no edema and no tenderness.  Lymphadenopathy:    He has no cervical adenopathy.  Neurological: He is alert and oriented to person, place, and time. He has normal reflexes. No cranial nerve deficit. He exhibits normal muscle tone. Coordination normal.  Skin: Skin is dry. No rash noted. No erythema. No pallor.  Psychiatric: He has a normal mood and affect. His behavior is normal. Judgment and thought content normal.    ED Course  Procedures (including critical care time) Labs Review Labs Reviewed - No data to display Imaging Review Ct Head Wo Contrast  12/22/2013   CLINICAL DATA:  Headache.  EXAM: CT HEAD WITHOUT CONTRAST  TECHNIQUE: Contiguous axial images were obtained from the base of the skull through the vertex without intravenous contrast.  COMPARISON:  Head CT scan 12/06/2013.  FINDINGS: Remote left frontal infarct is again seen. There is some chronic microvascular ischemic change. No evidence of acute abnormality including infarction, hemorrhage, mass lesion, mass effect, midline shift or abnormal extra-axial fluid collection. No hydrocephalus or pneumocephalus. Chronic left maxillary sinus disease appears unchanged.  IMPRESSION: No acute finding.  Remote left frontal infarct and chronic microvascular ischemic change.  Chronic left maxillary sinusitis.   Electronically Signed   By: Inge Rise M.D.   On: 12/22/2013 23:30    EKG Interpretation   None       MDM   1. Malignant hypertension   2. Left-sided weakness   3. Headache   4. Migraine variant    43 year old male with hypertensive urgency, headache.  Recently admitted for same and left AMA.  He now requesting admission back to the hospital.  Discuss with hospitalist, who will readmit him   Kalman Drape, MD 12/24/13 [phone]
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
## 2013-12-24 NOTE — ED Notes (Signed)
Lee, MD at bedside

## 2013-12-24 NOTE — ED Notes (Signed)
Dilantin level has to be redrawn; sample placed in Red top; Lab notified that sample needs to be in [employer] top; Will notify floor to request redraw of blood.

## 2013-12-24 NOTE — Progress Notes (Signed)
Utilization review completed.  

## 2013-12-24 NOTE — Discharge Instructions (Signed)
Follow with Primary MD [name], MD in 7 days   Get CBC, CMP, checked 7 days by Primary MD and again as instructed by your Primary MD.    Activity: As tolerated with Full fall precautions use walker/cane & assistance as needed   Disposition Home     Diet:  Heart Healthy.  For Heart failure patients - Check your Weight same time everyday, if you gain over 2 pounds, or you develop in leg swelling, experience more shortness of breath or chest pain, call your Primary MD immediately. Follow Cardiac Low Salt Diet and 1.8 lit/day fluid restriction.   On your next visit with her primary care physician please Get Medicines reviewed and adjusted.  Please request your Prim.MD to go over all Hospital Tests and Procedure/Radiological results at the follow up, please get all Hospital records sent to your Prim MD by signing hospital release before you go home.   If you experience worsening of your admission symptoms, develop shortness of breath, life threatening emergency, suicidal or homicidal thoughts you must seek medical attention immediately by calling 911 or calling your MD immediately  if symptoms less severe.  You Must read complete instructions/literature along with all the possible adverse reactions/side effects for all the Medicines you take and that have been prescribed to you. Take any new Medicines after you have completely understood and accpet all the possible adverse reactions/side effects.   Do not drive and provide baby sitting services if your were admitted for syncope or siezures until you have seen by Primary MD or a Neurologist and advised to do so again.  Do not drive when taking Pain medications.    Do not take more than prescribed Pain, Sleep and Anxiety Medications  Special Instructions: If you have smoked or chewed Tobacco  in the last 2 yrs please stop smoking, stop any regular Alcohol  and or any Recreational drug use.  Wear Seat belts while driving.   Please  note  You were cared for by a hospitalist during your hospital stay. If you have any questions about your discharge medications or the care you received while you were in the hospital after you are discharged, you can call the unit and asked to speak with the hospitalist on call if the hospitalist that took care of you is not available. Once you are discharged, your primary care physician will handle any further medical issues. Please note that NO REFILLS for any discharge medications will be authorized once you are discharged, as it is imperative that you return to your primary care physician (or establish a relationship with a primary care physician if you do not have one) for your aftercare needs so that they can reassess your need for medications and monitor your lab values.

## 2013-12-24 NOTE — Progress Notes (Signed)
[facility]                                                                                Patient Demographics  Richard [name], is a 43 y.o. male, DOB - [birth year], EHM:[id number]  Admit date - 12/23/2013   Admitting Physician Orvan Falconer, MD  Outpatient Primary MD for the patient is [name], MD  LOS - 1   Chief Complaint  Patient presents with  . Headache        Assessment & Plan    He left AMA 12-23-13  and came back in 2 hrs.   1. ? Intermittent Left-sided weakness ongoing for the last 10 days. Question TIA/CVA versus hypertensive encephalopathy. Continue one aspirin, head CT was stable, full stroke workup is underway which includes MRI MRA brain, echogram, carotid duplex, A1c and lipid panel. Will be evaluated by PT OT. Neurology to see.  Lab Results  Component Value Date   HGBA1C 6.1* 12/23/2013    Lab Results  Component Value Date   CHOL 138 12/24/2013   HDL 45 12/24/2013   LDLCALC 80 12/24/2013   TRIG 65 12/24/2013   CHOLHDL 3.1 12/24/2013      2. Hypertensive urgency/crisis. His neurological symptoms have been ongoing for close to 10 days - we'll start to titrate his medications and bring his blood pressure down, have started him on hydralazine, Norvasc, ACE and monitor. As needed Catapres.    3. History of seizure disorder. Continue home dose Dilantin, Dilantin levels would low and below with one dose of IV Dilantin.   4. Headaches secondary to hypertensive crisis. Supportive care and blood pressure control   5. History of alcohol and cocaine abuse. Says he has quit both for the last several months. Urine drug screen is negative. Will put him on low-dose Librium along with folic acid and thiamine.   6. Ongoing smoking. Counseled to quit smoking.     Code Status: Full  Family Communication:   Disposition Plan: Home   Procedures  CT scan of the head, MRI MRA brain, echo, carotid duplex.   Consults Neuro   Medications  Scheduled  Meds: . [START ON 12/25/2013] amLODipine  10 mg Oral Daily  . aspirin EC  325 mg Oral Daily  . atenolol  25 mg Oral Daily  . hydrALAZINE  50 mg Oral Q8H  . phenytoin  300 mg Oral Daily  . ramipril  5 mg Oral Daily   Continuous Infusions:  PRN Meds:.albuterol, labetalol, traMADol  DVT Prophylaxis    SCDs   Lab Results  Component Value Date   PLT 319 12/22/2013    Antibiotics     Anti-infectives   None          Subjective:   Richard [name] today has, + headache and stable mild L sided weakness, No chest pain, No abdominal pain - No Nausea, No new weakness tingling or numbness, No Cough - SOB.    Objective:   Filed Vitals:   12/24/13 0300 12/24/13 0630 12/24/13 0833 12/24/13 1013  BP: 184/116 161/109 145/83 146/96  Pulse: 73 79 81 74  Temp: 98 F (36.7 C) 98.1 F (36.7 C) 98.2 F (36.8  C) 98.1 F (36.7 C)  TempSrc: Oral Oral Oral Oral  Resp: 20 20 20 20   Height: 5\' 9"  (1.753 m)     Weight: 102.513 kg (226 lb)     SpO2: 97% 95% 99% 96%    Wt Readings from Last 3 Encounters:  12/24/13 102.513 kg (226 lb)  12/22/13 102.513 kg (226 lb)  12/22/13 102.513 kg (226 lb)    No intake or output data in the 24 hours ending 12/24/13 1058  Exam Awake Alert, Oriented X 3, No new F.N deficits, Normal affect, ? Mild L sided weakness and facial droop as compared to the left [place].AT,PERRAL Supple Neck,No JVD, No cervical lymphadenopathy appriciated.  Symmetrical Chest wall movement, Good air movement bilaterally, CTAB RRR,No Gallops,Rubs or new Murmurs, No Parasternal Heave +ve B.Sounds, Abd Soft, Non tender, No organomegaly appriciated, No rebound - guarding or rigidity. No Cyanosis, Clubbing or edema, No new Rash or bruise    Data Review   Micro Results Recent Results (from the past 240 hour(s))  MRSA PCR SCREENING     Status: None   Collection Time    12/23/13  5:55 AM      Result Value Range Status   MRSA by PCR NEGATIVE  NEGATIVE Final   Comment:            The  GeneXpert MRSA Assay (FDA     approved for NASAL specimens     only), is one component of a     comprehensive MRSA colonization     surveillance program. It is not     intended to diagnose MRSA     infection nor to guide or     monitor treatment for     MRSA infections.    Radiology Reports Ct Head Wo Contrast  12/22/2013   CLINICAL DATA:  Headache.  EXAM: CT HEAD WITHOUT CONTRAST  TECHNIQUE: Contiguous axial images were obtained from the base of the skull through the vertex without intravenous contrast.  COMPARISON:  Head CT scan 12/06/2013.  FINDINGS: Remote left frontal infarct is again seen. There is some chronic microvascular ischemic change. No evidence of acute abnormality including infarction, hemorrhage, mass lesion, mass effect, midline shift or abnormal extra-axial fluid collection. No hydrocephalus or pneumocephalus. Chronic left maxillary sinus disease appears unchanged.  IMPRESSION: No acute finding.  Remote left frontal infarct and chronic microvascular ischemic change.  Chronic left maxillary sinusitis.   Electronically Signed   By: Inge Rise M.D.   On: 12/22/2013 23:30   Ct Head Wo Contrast  12/06/2013   CLINICAL DATA:  History of seizures.  Syncope.  EXAM: CT HEAD WITHOUT CONTRAST  TECHNIQUE: Contiguous axial images were obtained from the base of the skull through the vertex without intravenous contrast.  COMPARISON:  Head CT scan 10/29/2012 and 06/17/2011.  FINDINGS: Remote left frontal infarct is seen. There is some chronic microvascular ischemic change. No evidence of acute abnormality including infarction, hemorrhage, mass lesion, mass effect, midline shift or abnormal extra-axial fluid collection. There is chronic left maxillary sinus disease with complete opacification of the visualized left maxillary sinus and marked wall thickening. A small amount of mastoid fluid is seen bilaterally.  IMPRESSION: No acute finding.  Remote left frontal infarct.  Chronic left maxillary  sinus disease.   Electronically Signed   By: Inge Rise M.D.   On: 12/06/2013 19:36    CBC  Recent Labs Lab 12/22/13 2245  WBC 8.3  HGB 15.2  HCT 45.3  PLT 319  MCV 91.3  MCH 30.6  MCHC 33.6  RDW 14.0  LYMPHSABS 2.3  MONOABS 0.8  EOSABS 0.0  BASOSABS 0.0    Chemistries   Recent Labs Lab 12/22/13 2245  NA 137  K 4.4  CL 99  CO2 26  GLUCOSE 94  BUN 13  CREATININE 0.92  CALCIUM 9.4  AST 17  ALT 16  ALKPHOS 92  BILITOT <0.2*   ------------------------------------------------------------------------------------------------------------------ estimated creatinine clearance is 122.1 ml/min (by C-G formula based on Cr of 0.92). ------------------------------------------------------------------------------------------------------------------  Recent Labs  12/23/13 0750  HGBA1C 6.1*   ------------------------------------------------------------------------------------------------------------------  Recent Labs  12/23/13 0750 12/24/13 0630  CHOL 146 138  HDL 53 45  LDLCALC 80 80  TRIG 65 65  CHOLHDL 2.8 3.1   ------------------------------------------------------------------------------------------------------------------ No results found for this basename: TSH, T4TOTAL, FREET3, T3FREE, THYROIDAB,  in the last 72 hours ------------------------------------------------------------------------------------------------------------------ No results found for this basename: VITAMINB12, FOLATE, FERRITIN, TIBC, IRON, RETICCTPCT,  in the last 72 hours  Coagulation profile  Recent Labs Lab 12/22/13 2245  INR 0.87    No results found for this basename: DDIMER,  in the last 72 hours  Cardiac Enzymes  Recent Labs Lab 12/22/13 2245  TROPONINI <0.30   ------------------------------------------------------------------------------------------------------------------ No components found with this basename: POCBNP,      Time Spent in minutes   35   Lala Lund K M.D on 12/24/2013 at 10:58 AM  Between 7am to 7pm - Pager - [phone]  After 7pm go to [email or web address] - password [email or web address]  And look for the night coverage person covering for me after hours  [facility] Office  [phone]

## 2013-12-25 ENCOUNTER — Observation Stay (HOSPITAL_COMMUNITY): Payer: PRIVATE HEALTH INSURANCE

## 2013-12-25 DIAGNOSIS — I10 Essential (primary) hypertension: Secondary | ICD-10-CM | POA: Diagnosis not present

## 2013-12-25 LAB — GLUCOSE, CAPILLARY
GLUCOSE-CAPILLARY: 107 mg/dL — AB (ref 70–99)
Glucose-Capillary: 94 mg/dL (ref 70–99)

## 2013-12-25 MED ORDER — PHENYTOIN SODIUM EXTENDED 300 MG PO CAPS: 300.0000 mg | ORAL_CAPSULE | Freq: Every day | ORAL | Status: DC

## 2013-12-25 MED ORDER — RAMIPRIL 5 MG PO CAPS: 5.0000 mg | ORAL_CAPSULE | Freq: Every day | ORAL | Status: DC

## 2013-12-25 MED ORDER — ASPIRIN BUFFERED 325 MG PO TABS: 325.0000 mg | ORAL_TABLET | Freq: Every day | ORAL | Status: DC

## 2013-12-25 MED ORDER — HYDRALAZINE HCL 50 MG PO TABS: 50.0000 mg | ORAL_TABLET | Freq: Three times a day (TID) | ORAL | Status: DC

## 2013-12-25 MED ORDER — AMLODIPINE BESYLATE 10 MG PO TABS: 10.0000 mg | ORAL_TABLET | Freq: Every day | ORAL | Status: DC

## 2013-12-25 MED ORDER — ATENOLOL 50 MG PO TABS: 50.0000 mg | ORAL_TABLET | Freq: Every day | ORAL | Status: DC

## 2013-12-25 NOTE — Progress Notes (Signed)
Patients MRI negative for acute infarct, per Dr Candiss Norse OK to d/c home. VSS. Patient called taxi to come pick him up at [place] tower entrance. Advised patient to quit smoking, reviewed medications/prescriptions with patient. Education completed. Assisted patient down stairs to front entrance.

## 2013-12-25 NOTE — Discharge Summary (Signed)
[facility]                                                                                   Richard [name], is a 43 y.o. male  DOB 06/05/1971  MRN [id number].  Admission date:  12/23/2013  Admitting Physician  Orvan Falconer, MD  Discharge Date:  12/25/2013   Primary MD  [name], MD  Recommendations for primary care physician for things to follow:    Follow blood pressure and adjust medications closely, please make sure patient follows with neurology outpatient   Admission Diagnosis  Malignant hypertension [401.0] Migraine variant [346.20] Left-sided weakness [728.87] Headache [784.0]  Discharge Diagnosis   hypertensive crisis  Active Problems:   TOBACCO ABUSE   Headache(784.0)   Left-sided weakness   Malignant hypertension   Seizure disorder   TIA (transient ischemic attack)      Past Medical History  Diagnosis Date  . Hypertension   . Seizures     last episode 03/2013  . Asthma   . Brain bleed     Past Surgical History  Procedure Laterality Date  . Leg surgery       Discharge Condition: stable       Follow-up Information   Follow up with [name], MD. Schedule an appointment as soon as possible for a visit in 1 week.   Specialty:  Internal Medicine   Contact information:   [place] [address] [phone]       Follow up with Forbes Cellar, MD. Schedule an appointment as soon as possible for a visit in 1 week.   Specialties:  Neurology, Radiology   Contact information:   [address] [place] [address] [phone]         Consults obtained - Neuro   Discharge Medications      Medication List    STOP taking these medications       GOODYS EXTRA STRENGTH PO      TAKE these medications       amLODipine 10 MG tablet  Commonly known as:  NORVASC  Take 1 tablet (10 mg total) by mouth daily.     aspirin 325 MG buffered tablet  Take 1 tablet (325 mg total) by mouth daily.      atenolol 50 MG tablet  Commonly known as:  TENORMIN  Take 1 tablet (50 mg total) by mouth daily.     hydrALAZINE 50 MG tablet  Commonly known as:  APRESOLINE  Take 1 tablet (50 mg total) by mouth every 8 (eight) hours.     phenytoin 300 MG ER capsule  Commonly known as:  DILANTIN  Take 1 capsule (300 mg total) by mouth daily.     PROAIR HFA 108 (90 BASE) MCG/ACT inhaler  Generic drug:  albuterol  Inhale 1-2 puffs into the lungs every 6 (six) hours as needed for wheezing or shortness of breath.     ramipril 5 MG capsule  Commonly known as:  ALTACE  Take 1 capsule (5 mg total) by mouth daily.     traMADol 50 MG tablet  Commonly known as:  ULTRAM  Take 50 mg  by mouth every 6 (six) hours as needed for severe pain.     Vitamin D (Ergocalciferol) 50000 UNITS Caps capsule  Commonly known as:  DRISDOL  Take 1 capsule (50,000 Units total) by mouth every 7 (seven) days.         Diet and Activity recommendation: See Discharge Instructions below   Discharge Instructions     Follow with Primary MD [name], MD in 7 days   Get CBC, CMP, checked 7 days by Primary MD and again as instructed by your Primary MD.    Activity: As tolerated with Full fall precautions use walker/cane & assistance as needed   Disposition Home     Diet:  Heart Healthy.  For Heart failure patients - Check your Weight same time everyday, if you gain over 2 pounds, or you develop in leg swelling, experience more shortness of breath or chest pain, call your Primary MD immediately. Follow Cardiac Low Salt Diet and 1.8 lit/day fluid restriction.   On your next visit with her primary care physician please Get Medicines reviewed and adjusted.  Please request your Prim.MD to go over all Hospital Tests and Procedure/Radiological results at the follow up, please get all Hospital records sent to your Prim MD by signing hospital release before you go home.   If you experience worsening of your admission  symptoms, develop shortness of breath, life threatening emergency, suicidal or homicidal thoughts you must seek medical attention immediately by calling 911 or calling your MD immediately  if symptoms less severe.  You Must read complete instructions/literature along with all the possible adverse reactions/side effects for all the Medicines you take and that have been prescribed to you. Take any new Medicines after you have completely understood and accpet all the possible adverse reactions/side effects.   Do not drive and provide baby sitting services if your were admitted for syncope or siezures until you have seen by Primary MD or a Neurologist and advised to do so again.  Do not drive when taking Pain medications.    Do not take more than prescribed Pain, Sleep and Anxiety Medications  Special Instructions: If you have smoked or chewed Tobacco  in the last 2 yrs please stop smoking, stop any regular Alcohol  and or any Recreational drug use.  Wear Seat belts while driving.   Please note  You were cared for by a hospitalist during your hospital stay. If you have any questions about your discharge medications or the care you received while you were in the hospital after you are discharged, you can call the unit and asked to speak with the hospitalist on call if the hospitalist that took care of you is not available. Once you are discharged, your primary care physician will handle any further medical issues. Please note that NO REFILLS for any discharge medications will be authorized once you are discharged, as it is imperative that you return to your primary care physician (or establish a relationship with a primary care physician if you do not have one) for your aftercare needs so that they can reassess your need for medications and monitor your lab values.  Major procedures and Radiology Reports - PLEASE review detailed and final reports for all details, in brief -    Echo-Carotid-MRI-A  Brain - all stable see report for details echo shows EF of 60% with grade 1 chronic LVH and diastolic dysfunction.   Ct Head Wo Contrast  12/22/2013   CLINICAL DATA:  Headache.  EXAM:  CT HEAD WITHOUT CONTRAST  TECHNIQUE: Contiguous axial images were obtained from the base of the skull through the vertex without intravenous contrast.  COMPARISON:  Head CT scan 12/06/2013.  FINDINGS: Remote left frontal infarct is again seen. There is some chronic microvascular ischemic change. No evidence of acute abnormality including infarction, hemorrhage, mass lesion, mass effect, midline shift or abnormal extra-axial fluid collection. No hydrocephalus or pneumocephalus. Chronic left maxillary sinus disease appears unchanged.  IMPRESSION: No acute finding.  Remote left frontal infarct and chronic microvascular ischemic change.  Chronic left maxillary sinusitis.   Electronically Signed   By: Inge Rise M.D.   On: 12/22/2013 23:30   Ct Head Wo Contrast  12/06/2013   CLINICAL DATA:  History of seizures.  Syncope.  EXAM: CT HEAD WITHOUT CONTRAST  TECHNIQUE: Contiguous axial images were obtained from the base of the skull through the vertex without intravenous contrast.  COMPARISON:  Head CT scan 10/29/2012 and 06/17/2011.  FINDINGS: Remote left frontal infarct is seen. There is some chronic microvascular ischemic change. No evidence of acute abnormality including infarction, hemorrhage, mass lesion, mass effect, midline shift or abnormal extra-axial fluid collection. There is chronic left maxillary sinus disease with complete opacification of the visualized left maxillary sinus and marked wall thickening. A small amount of mastoid fluid is seen bilaterally.  IMPRESSION: No acute finding.  Remote left frontal infarct.  Chronic left maxillary sinus disease.   Electronically Signed   By: Inge Rise M.D.   On: 12/06/2013 19:36    Micro Results      Recent Results (from the past 240 hour(s))  MRSA PCR SCREENING      Status: None   Collection Time    12/23/13  5:55 AM      Result Value Range Status   MRSA by PCR NEGATIVE  NEGATIVE Final   Comment:            The GeneXpert MRSA Assay (FDA     approved for NASAL specimens     only), is one component of a     comprehensive MRSA colonization     surveillance program. It is not     intended to diagnose MRSA     infection nor to guide or     monitor treatment for     MRSA infections.     History of present illness and  Hospital Course:     Kindly see H&P for history of present illness and admission details, please review complete Labs, Consult reports and Test reports for all details in brief Richard [name], is a 43 y.o. male, patient with history of   poorly controlled hypertension, history of seizures, alcohol and cocaine abuse in the past along with smoking, counseled to quit all claims to be only smoking intermittently now. Was admitted to the hospital with generalized headache, some focal intermittent left-sided weakness ongoing  for close to couple of weeks. An extremely elevated blood pressures consistent with hypertensive crisis .   For his intermittent left-sided weakness which could have been due to hypertensive encephalopathy he was seen by neurology underwent a full TIA/stroke workup with stable echo, carotid duplex, lipid panel and A1c. MRI MRA results are unremarkable and showed chronic changes only.   Will be placed on aspirin along with blood pressure medications for tight blood pressure control. Will request him to follow with his PCP on a close basis and get blood pressure monitored and medications adjusted further as needed.   For his history  of seizures his Dilantin level was low at home Dilantin dose has been increased after IV Dilantin bolus this admission.   He has been counseled to completely abstain from regular alcohol abuse, cocaine use and smoking. He claims to be only intermittently smoking at this time.    Today    Subjective:   Richard [name] today has no headache,no chest abdominal pain,no new weakness tingling or numbness, feels much better wants to go home today.   Objective:   Blood pressure 150/100, pulse 76, temperature 97.8 F (36.6 C), temperature source Oral, resp. rate 22, height 5\' 9"  (1.753 m), weight 102.513 kg (226 lb), SpO2 96.00%.  No intake or output data in the 24 hours ending 12/25/13 1539  Exam Awake Alert, Oriented *3, No new F.N deficits, Normal affect [place].AT,PERRAL Supple Neck,No JVD, No cervical lymphadenopathy appriciated.  Symmetrical Chest wall movement, Good air movement bilaterally, CTAB RRR,No Gallops,Rubs or new Murmurs, No Parasternal Heave +ve B.Sounds, Abd Soft, Non tender, No organomegaly appriciated, No rebound -guarding or rigidity. No Cyanosis, Clubbing or edema, No new Rash or bruise  Data Review   CBC w Diff:  Lab Results  Component Value Date   WBC 8.3 12/22/2013   HGB 15.2 12/22/2013   HCT 45.3 12/22/2013   PLT 319 12/22/2013   LYMPHOPCT 28 12/22/2013   MONOPCT 10 12/22/2013   EOSPCT 0 12/22/2013   BASOPCT 0 12/22/2013    CMP:  Lab Results  Component Value Date   NA 137 12/22/2013   K 4.4 12/22/2013   CL 99 12/22/2013   CO2 26 12/22/2013   BUN 13 12/22/2013   CREATININE 0.92 12/22/2013   CREATININE 0.91 12/05/2013   PROT 7.4 12/22/2013   ALBUMIN 3.8 12/22/2013   BILITOT <0.2* 12/22/2013   ALKPHOS 92 12/22/2013   AST 17 12/22/2013   ALT 16 12/22/2013  .   Total Time in preparing paper work, data evaluation and todays exam - 35 minutes  Thurnell Lose M.D on 12/25/2013 at 3:39 PM  [facility] Office  [phone]

## 2013-12-30 ENCOUNTER — Emergency Department (HOSPITAL_COMMUNITY): Payer: Medicare Other

## 2013-12-30 ENCOUNTER — Emergency Department (HOSPITAL_COMMUNITY)
Admission: EM | Admit: 2013-12-30 | Discharge: 2013-12-30 | Disposition: A | Discharge status: Home / Self Care | Payer: Medicare Other | Attending: Emergency Medicine | Admitting: Emergency Medicine

## 2013-12-30 ENCOUNTER — Encounter (HOSPITAL_COMMUNITY): Payer: Self-pay | Admitting: Emergency Medicine

## 2013-12-30 DIAGNOSIS — G4730 Sleep apnea, unspecified: Secondary | ICD-10-CM

## 2013-12-30 DIAGNOSIS — Z7982 Long term (current) use of aspirin: Secondary | ICD-10-CM | POA: Insufficient documentation

## 2013-12-30 DIAGNOSIS — I10 Essential (primary) hypertension: Secondary | ICD-10-CM | POA: Insufficient documentation

## 2013-12-30 DIAGNOSIS — J45909 Unspecified asthma, uncomplicated: Secondary | ICD-10-CM | POA: Insufficient documentation

## 2013-12-30 DIAGNOSIS — F17200 Nicotine dependence, unspecified, uncomplicated: Secondary | ICD-10-CM | POA: Insufficient documentation

## 2013-12-30 DIAGNOSIS — Z79899 Other long term (current) drug therapy: Secondary | ICD-10-CM | POA: Insufficient documentation

## 2013-12-30 DIAGNOSIS — G40909 Epilepsy, unspecified, not intractable, without status epilepticus: Secondary | ICD-10-CM | POA: Insufficient documentation

## 2013-12-30 LAB — CBC
HCT: 48 % (ref 39.0–52.0)
Hemoglobin: 17.1 g/dL — ABNORMAL HIGH (ref 13.0–17.0)
MCH: 32.6 pg (ref 26.0–34.0)
MCHC: 35.6 g/dL (ref 30.0–36.0)
MCV: 91.4 fL (ref 78.0–100.0)
Platelets: 278 10*3/uL (ref 150–400)
RBC: 5.25 MIL/uL (ref 4.22–5.81)
RDW: 14 % (ref 11.5–15.5)
WBC: 10.5 10*3/uL (ref 4.0–10.5)

## 2013-12-30 LAB — POCT I-STAT, CHEM 8
BUN: 17 mg/dL (ref 6–23)
CALCIUM ION: 1.11 mmol/L — AB (ref 1.12–1.23)
Chloride: 107 mEq/L (ref 96–112)
Creatinine, Ser: 1 mg/dL (ref 0.50–1.35)
Glucose, Bld: 98 mg/dL (ref 70–99)
HEMATOCRIT: 55 % — AB (ref 39.0–52.0)
Hemoglobin: 18.7 g/dL — ABNORMAL HIGH (ref 13.0–17.0)
POTASSIUM: 5.3 meq/L (ref 3.7–5.3)
Sodium: 139 mEq/L (ref 137–147)
TCO2: 26 mmol/L (ref 0–100)

## 2013-12-30 LAB — RAPID URINE DRUG SCREEN, HOSP PERFORMED
Amphetamines: NOT DETECTED
BARBITURATES: NOT DETECTED
Benzodiazepines: NOT DETECTED
COCAINE: NOT DETECTED
Opiates: NOT DETECTED
TETRAHYDROCANNABINOL: NOT DETECTED

## 2013-12-30 LAB — CG4 I-STAT (LACTIC ACID): Lactic Acid, Venous: 1.24 mmol/L (ref 0.5–2.2)

## 2013-12-30 NOTE — Discharge Instructions (Signed)
Sleep Apnea  Sleep apnea is a sleep disorder characterized by abnormal pauses in breathing while you sleep. When your breathing pauses, the level of oxygen in your blood decreases. This causes you to move out of deep sleep and into light sleep. As a result, your quality of sleep is poor, and the system that carries your blood throughout your body (cardiovascular system) experiences stress. If sleep apnea remains untreated, the following conditions can develop:  High blood pressure (hypertension).  Coronary artery disease.  Inability to achieve or maintain an erection (impotence).  Impairment of your thought process (cognitive dysfunction). There are three types of sleep apnea: 1. Obstructive sleep apnea Pauses in breathing during sleep because of a blocked airway. 2. Central sleep apnea Pauses in breathing during sleep because the area of the brain that controls your breathing does not send the correct signals to the muscles that control breathing. 3. Mixed sleep apnea A combination of both obstructive and central sleep apnea. RISK FACTORS The following risk factors can increase your risk of developing sleep apnea:  Being overweight.  Smoking.  Having narrow passages in your nose and throat.  Being of older age.  Being male.  Alcohol use.  Sedative and tranquilizer use.  Ethnicity. Among individuals younger than 35 years, African Americans are at increased risk of sleep apnea. SYMPTOMS   Difficulty staying asleep.  Daytime sleepiness and fatigue.  Loss of energy.  Irritability.  Loud, heavy snoring.  Morning headaches.  Trouble concentrating.  Forgetfulness.  Decreased interest in sex. DIAGNOSIS  In order to diagnose sleep apnea, your caregiver will perform a physical examination. Your caregiver may suggest that you take a home sleep test. Your caregiver may also recommend that you spend the night in a sleep lab. In the sleep lab, several monitors record  information about your heart, lungs, and brain while you sleep. Your leg and arm movements and blood oxygen level are also recorded. TREATMENT The following actions may help to resolve mild sleep apnea:  Sleeping on your side.   Using a decongestant if you have nasal congestion.   Avoiding the use of depressants, including alcohol, sedatives, and narcotics.   Losing weight and modifying your diet if you are overweight. There also are devices and treatments to help open your airway:  Oral appliances. These are custom-made mouthpieces that shift your lower jaw forward and slightly open your bite. This opens your airway.  Devices that create positive airway pressure. This positive pressure "splints" your airway open to help you breathe better during sleep. The following devices create positive airway pressure:  Continuous positive airway pressure (CPAP) device. The CPAP device creates a continuous level of air pressure with an air pump. The air is delivered to your airway through a mask while you sleep. This continuous pressure keeps your airway open.  Nasal expiratory positive airway pressure (EPAP) device. The EPAP device creates positive air pressure as you exhale. The device consists of single-use valves, which are inserted into each nostril and held in place by adhesive. The valves create very little resistance when you inhale but create much more resistance when you exhale. That increased resistance creates the positive airway pressure. This positive pressure while you exhale keeps your airway open, making it easier to breath when you inhale again.  Bilevel positive airway pressure (BPAP) device. The BPAP device is used mainly in patients with central sleep apnea. This device is similar to the CPAP device because it also uses an air pump to deliver  continuous air pressure through a mask. However, with the BPAP machine, the pressure is set at two different levels. The pressure when you  exhale is lower than the pressure when you inhale.  Surgery. Typically, surgery is only done if you cannot comply with less invasive treatments or if the less invasive treatments do not improve your condition. Surgery involves removing excess tissue in your airway to create a wider passage way. Document Released: 11/20/2002 Document Revised: 03/27/2013 Document Reviewed: 04/07/2012 [facility] Patient Information 2014 [place].  CPAP and BIPAP Information CPAP and BIPAP are methods of helping you breathe with the use of air pressure. CPAP stands for "continuous positive airway pressure." BIPAP stands for "bi-level positive airway pressure." In both methods, air is blown into your air passages to help keep you breathing well. With CPAP, the amount of pressure stays the same while you breathe in and out. CPAP is most commonly used for obstructive sleep apnea. For obstructive sleep apnea, CPAP works by holding your airways open so that they do not collapse when your muscles relax during sleep. BIPAP is similar to CPAP except the amount of pressure is increased when you inhale. This helps you take larger breaths. Your health care provider will recommend whether CPAP or BIPAP would be more helpful for you.  WHY ARE CPAP AND BIPAP TREATMENTS USED? CPAP or BIPAP can be helpful if you have:   Sleep apnea.   Chronic obstructive pulmonary disease (COPD).   Diseases that weaken the muscles of the chest, including muscular dystrophy or neurological diseases such as amyotrophic lateral sclerosis (ALS).   Other problems that cause breathing to be weak, abnormal, or difficult.  HOW IS CPAP OR BIPAP ADMINISTERED? Both CPAP and BIPAP are provided by a small machine with a flexible plastic tube that attaches to a plastic mask. The mask fits on your face, and air is blown into your air passages through your nose or mouth. The amount of pressure that is used to blow the air into your air passages can be set  on the machine. Your health care provider will determine the pressure setting that should be used based on your individual needs.  WHEN SHOULD CPAP OR BIPAP BE USED? In most cases, the mask is worn only when sleeping. Generally, you will need to wear the mask throughout the night and during the daytime if you take a nap. In a few cases involving certain medical conditions, people also need to wear the mask at other times when they are awake. Follow your health care provider's instructions for when to use the machine.  USING THE MASK  Because the mask needs to be snug, some people feel a trapped or closed-in feeling (claustrophobic) when first using the mask. You may need to get used to the mask gradually. To do this, you can first hold the mask loosely over your nose or mouth. Gradually apply the mask more snugly. You can also gradually increase the amount of time that you use the mask.   Masks are available in various types and sizes. Some fit over your mouth and nose, and some fit over just your nose. If your mask does not fit well, talk to your health care provider about getting a different one.  If you are using a nasal mask and you tend to breathe through your mouth, a chin strap may be applied to help keep your mouth closed.   The CPAP and BIPAP machines have alarms that may sound if the mask  comes off or develops a leak.   If you have trouble with the mask, it is very important that you talk to your health care provider about finding a way to make the mask easier to tolerate. Do not stop using the mask. This could have a negative impact on your health. TIPS FOR USING THE MACHINE  Place your CPAP or BIPAP machine on a secure table or stand near an electrical outlet.   Know where the on-off switch is located on the machine.   Follow your health care provider's instructions for how to set the pressure on your machine and when you should use it.   Do not eat or drink while the CPAP or  BIPAP machine is on. Food or fluids could get pushed into your lungs by the pressure of the CPAP or BIPAP.  Do not smoke. Tobacco smoke residue can damage the machine.   For home use, CPAP and BIPAP machines can be rented or purchased through home health care companies. Many different brands of machines are available. Renting a machine before purchasing may help you find out which particular machine works well for you. SEEK IMMEDIATE MEDICAL CARE IF:  You have redness or open areas around your nose or mouth where the mask fits.   You have trouble operating the CPAP or BIPAP machine.   You cannot tolerate wearing the CPAP or BIPAP mask.  Document Released: 08/28/2004 Document Revised: 08/02/2013 Document Reviewed: 06/29/2013 [facility] Patient Information 2014 [place], [place].

## 2013-12-30 NOTE — ED Notes (Signed)
Pt states understanding of discharge instructions 

## 2013-12-30 NOTE — ED Notes (Signed)
Pt states HA resolved after receiving oxygen by EMS.

## 2013-12-30 NOTE — ED Notes (Signed)
Pt arrived from home via [facility] c/o HA and HTN. Per EMS HA currently resolved. EMS BP 160/106, 96% on RA. Recent admit on 9th for 3 days.

## 2014-01-04 ENCOUNTER — Institutional Professional Consult (permissible substitution): Payer: Medicare Other | Admitting: Internal Medicine

## 2014-01-04 NOTE — ED Provider Notes (Signed)
CSN: [id number]     Arrival date & time 12/30/13  1906 History   First MD Initiated Contact with Patient 12/30/13 1934     Chief Complaint  Patient presents with  . Headache    HPI Pt arrived from home via [facility] c/o HA and HTN. Per EMS HA currently resolved. EMS BP 160/106, 96% on RA. Recent admit on 9th for 3 days.Patient does snore when he sleeps  Unsure if he has periods of apnea.  Past Medical History  Diagnosis Date  . Hypertension   . Seizures     last episode 03/2013  . Asthma   . Brain bleed    Past Surgical History  Procedure Laterality Date  . Leg surgery     Family History  Problem Relation Age of Onset  . Diabetes Mellitus II Sister   . Cancer Father    History  Substance Use Topics  . Smoking status: Current Every Day Smoker -- 1.00 packs/day for 26 years    Types: Cigarettes  . Smokeless tobacco: Never Used  . Alcohol Use: 2.4 oz/week    4 Cans of beer per week     Comment: drinks heavily. "pt sts he only drinks one beer a day now"    Review of Systems  All other systems reviewed and are negative.    Allergies  Dilaudid  Home Medications   Current Outpatient Rx  Name  Route  Sig  Dispense  Refill  . amLODipine (NORVASC) 10 MG tablet   Oral   Take 1 tablet (10 mg total) by mouth daily.   30 tablet   1   . aspirin 325 MG buffered tablet   Oral   Take 1 tablet (325 mg total) by mouth daily.   30 tablet   0   . atenolol (TENORMIN) 50 MG tablet   Oral   Take 1 tablet (50 mg total) by mouth daily.   30 tablet   3   . hydrALAZINE (APRESOLINE) 50 MG tablet   Oral   Take 1 tablet (50 mg total) by mouth every 8 (eight) hours.   90 tablet   3   . phenytoin (DILANTIN) 300 MG ER capsule   Oral   Take 300 mg by mouth 3 (three) times daily.         Marland Kitchen PROAIR HFA 108 (90 BASE) MCG/ACT inhaler   Inhalation   Inhale 1-2 puffs into the lungs every 6 (six) hours as needed for wheezing or shortness of breath.          . ramipril (ALTACE) 5  MG capsule   Oral   Take 1 capsule (5 mg total) by mouth daily.   30 capsule   3   . Vitamin D, Ergocalciferol, (DRISDOL) 50000 UNITS CAPS capsule   Oral   Take 1 capsule (50,000 Units total) by mouth every 7 (seven) days.   12 capsule   0    BP 134/88  Pulse 69  Temp(Src) 98.1 F (36.7 C) (Oral)  Resp 21  Ht 5\' 9"  (1.753 m)  Wt 225 lb (102.059 kg)  BMI 33.21 kg/m2  SpO2 91% Physical Exam  Nursing note and vitals reviewed. Constitutional: He is oriented to person, place, and time. He appears well-developed and well-nourished. No distress.  HENT:  Head: Normocephalic and atraumatic.  Eyes: Pupils are equal, round, and reactive to light.  Neck: Normal range of motion.  Cardiovascular: Normal rate and intact distal pulses.   Pulmonary/Chest: No respiratory  distress.  Abdominal: Normal appearance. He exhibits no distension.  Musculoskeletal: Normal range of motion.  Neurological: He is alert and oriented to person, place, and time. He has normal strength. No cranial nerve deficit. Coordination and gait normal. GCS eye subscore is 4. GCS verbal subscore is 5. GCS motor subscore is 6.  Skin: Skin is warm and dry. No rash noted.  Psychiatric: He has a normal mood and affect. His behavior is normal.    ED Course  Procedures (including critical care time) Labs Review Labs Reviewed  CBC - Abnormal; Notable for the following:    Hemoglobin 17.1 (*)    All other components within normal limits  POCT I-STAT, CHEM 8 - Abnormal; Notable for the following:    Calcium, Ion 1.11 (*)    Hemoglobin 18.7 (*)    HCT 55.0 (*)    All other components within normal limits  URINE RAPID DRUG SCREEN (HOSP PERFORMED)  CG4 I-STAT (LACTIC ACID)   Imaging Review No results found.  CT Head IMPRESSION: No evidence of acute intracranial abnormality. No interval change from recent MRI and CT.  Stable encephalomalacic changes in the left frontal lobe.  Stable chronic left maxillary sinus  disease  EKG Interpretation   None       MDM   1. Sleep apnea        Dot Lanes, MD 01/04/14 2004

## 2014-01-11 ENCOUNTER — Ambulatory Visit: Payer: Medicare Other | Admitting: Internal Medicine

## 2014-01-12 ENCOUNTER — Encounter: Payer: Self-pay | Admitting: Pulmonary Disease

## 2014-01-17 ENCOUNTER — Institutional Professional Consult (permissible substitution): Payer: PRIVATE HEALTH INSURANCE | Admitting: Neurology

## 2014-01-18 ENCOUNTER — Encounter: Payer: Self-pay | Admitting: Neurology

## 2014-01-18 ENCOUNTER — Ambulatory Visit (INDEPENDENT_AMBULATORY_CARE_PROVIDER_SITE_OTHER): Payer: PRIVATE HEALTH INSURANCE | Admitting: Neurology

## 2014-01-18 VITALS — BP 178/120 | HR 69 | Temp 95.6°F | Ht 69.0 in | Wt 220.0 lb

## 2014-01-18 DIAGNOSIS — I10 Essential (primary) hypertension: Secondary | ICD-10-CM

## 2014-01-18 DIAGNOSIS — G4733 Obstructive sleep apnea (adult) (pediatric): Secondary | ICD-10-CM

## 2014-01-18 NOTE — Patient Instructions (Addendum)
Based on your symptoms and your exam I believe you are at risk for obstructive sleep apnea or OSA, and I think we should proceed with a sleep study to determine whether you do or do not have OSA and how severe it is. If you have more than mild OSA, I want you to consider treatment with CPAP. Please remember, the risks and ramifications of moderate to severe obstructive sleep apnea or OSA are: Cardiovascular disease, including congestive heart failure, stroke, difficult to control hypertension, arrhythmias, and even type 2 diabetes has been linked to untreated OSA. Sleep apnea causes disruption of sleep and sleep deprivation in most cases, which, in turn, can cause recurrent headaches, problems with memory, mood, concentration, focus, and vigilance. Most people with untreated sleep apnea report excessive daytime sleepiness, which can affect their ability to drive. Please do not drive if you feel sleepy.  I will see you back after your sleep study to go over the test results and where to go from there. We will call you after your sleep study and to set up an appointment at the time.   Please stop taking Goody powder.  Please proceed to the emergency room if you have any acute chest pain or shortness of breath or severe headache as these can be signs of very high blood pressure.
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
## 2014-01-24 ENCOUNTER — Telehealth: Payer: Self-pay | Admitting: Diagnostic Neuroimaging

## 2014-01-24 NOTE — Telephone Encounter (Signed)
This medication is typically prescribed by a PCP.  I called the patient back.  He will contact his PCP to request refill.

## 2014-01-24 NOTE — Telephone Encounter (Signed)
Patient calling to request for a refill of his inhaler medication (Proair), states that we were not the ones who prescribed it to him but he's wondering if he can get it through [place]. Patient wants it sent to [facility] on [address]. Please call patient and advise.

## 2014-01-26 ENCOUNTER — Institutional Professional Consult (permissible substitution): Payer: Medicare Other | Admitting: Pulmonary Disease

## 2014-02-08 ENCOUNTER — Institutional Professional Consult (permissible substitution): Payer: Medicare Other | Admitting: Internal Medicine

## 2014-03-05 ENCOUNTER — Telehealth: Payer: Self-pay | Admitting: Diagnostic Neuroimaging

## 2014-03-05 NOTE — Telephone Encounter (Signed)
Pt needs a refill on his Proair HFA Inhaler.  Please send this to the [facility] on [address]. Please contact if there are any problems.  Thank you

## 2014-03-05 NOTE — Telephone Encounter (Signed)
We do not prescribe this medication.  Patient should request this med from PCP, or whomever has been prescribing it.  (Please see previous note).  I called the patient back.  Got no answer.  Left message.

## 2014-03-07 ENCOUNTER — Telehealth: Payer: Self-pay | Admitting: Nurse Practitioner

## 2014-03-07 NOTE — Telephone Encounter (Signed)
We do not prescribe this medication.  Please see previous notes.  I called the patient back again.  He says he has contacted [place] in error, thought we were his PCP.  He will follow up with them for refill on inhaler.

## 2014-03-07 NOTE — Telephone Encounter (Signed)
Pt calling requesting a refill. Please advise

## 2014-03-07 NOTE — Telephone Encounter (Signed)
Caswell Corwin calling for patient to get ProAir inhaler refill.   Patient uses [facility] on [address].  Please call [phone] to contact patient if there are questions.

## 2014-03-09 ENCOUNTER — Institutional Professional Consult (permissible substitution): Payer: Medicare Other | Admitting: Internal Medicine

## 2014-03-19 ENCOUNTER — Encounter (HOSPITAL_COMMUNITY): Payer: Self-pay | Admitting: Emergency Medicine

## 2014-03-19 ENCOUNTER — Emergency Department (HOSPITAL_COMMUNITY): Payer: PRIVATE HEALTH INSURANCE

## 2014-03-19 ENCOUNTER — Inpatient Hospital Stay (HOSPITAL_COMMUNITY)
Admission: EM | Admit: 2014-03-19 | Discharge: 2014-03-20 | DRG: 305 | Disposition: A | Discharge status: Home / Self Care | Payer: PRIVATE HEALTH INSURANCE | Attending: Internal Medicine | Admitting: Internal Medicine

## 2014-03-19 DIAGNOSIS — Z87820 Personal history of traumatic brain injury: Secondary | ICD-10-CM

## 2014-03-19 DIAGNOSIS — I10 Essential (primary) hypertension: Secondary | ICD-10-CM | POA: Diagnosis present

## 2014-03-19 DIAGNOSIS — M25519 Pain in unspecified shoulder: Secondary | ICD-10-CM | POA: Diagnosis present

## 2014-03-19 DIAGNOSIS — Z7982 Long term (current) use of aspirin: Secondary | ICD-10-CM

## 2014-03-19 DIAGNOSIS — F17200 Nicotine dependence, unspecified, uncomplicated: Secondary | ICD-10-CM | POA: Diagnosis present

## 2014-03-19 DIAGNOSIS — R079 Chest pain, unspecified: Secondary | ICD-10-CM

## 2014-03-19 DIAGNOSIS — G8929 Other chronic pain: Secondary | ICD-10-CM | POA: Diagnosis present

## 2014-03-19 DIAGNOSIS — R040 Epistaxis: Secondary | ICD-10-CM | POA: Diagnosis present

## 2014-03-19 DIAGNOSIS — Z9119 Patient's noncompliance with other medical treatment and regimen: Secondary | ICD-10-CM

## 2014-03-19 DIAGNOSIS — G40909 Epilepsy, unspecified, not intractable, without status epilepticus: Secondary | ICD-10-CM

## 2014-03-19 DIAGNOSIS — I160 Hypertensive urgency: Secondary | ICD-10-CM

## 2014-03-19 DIAGNOSIS — Z91199 Patient's noncompliance with other medical treatment and regimen due to unspecified reason: Secondary | ICD-10-CM

## 2014-03-19 DIAGNOSIS — R51 Headache: Secondary | ICD-10-CM | POA: Diagnosis present

## 2014-03-19 DIAGNOSIS — R519 Headache, unspecified: Secondary | ICD-10-CM | POA: Diagnosis present

## 2014-03-19 LAB — BASIC METABOLIC PANEL
BUN: 11 mg/dL (ref 6–23)
CHLORIDE: 105 meq/L (ref 96–112)
CO2: 23 mEq/L (ref 19–32)
Calcium: 9.1 mg/dL (ref 8.4–10.5)
Creatinine, Ser: 0.98 mg/dL (ref 0.50–1.35)
GFR calc Af Amer: >90 mL/min (ref >90)
Glucose, Bld: 107 mg/dL — ABNORMAL HIGH (ref 70–99)
Potassium: 4.1 mEq/L (ref 3.7–5.3)
Sodium: 141 mEq/L (ref 137–147)

## 2014-03-19 LAB — RAPID URINE DRUG SCREEN, HOSP PERFORMED
Amphetamines: NOT DETECTED
Barbiturates: NOT DETECTED
Benzodiazepines: NOT DETECTED
COCAINE: NOT DETECTED
OPIATES: NOT DETECTED
Tetrahydrocannabinol: NOT DETECTED

## 2014-03-19 LAB — CBC
HCT: 45.6 % (ref 39.0–52.0)
HEMATOCRIT: 45 % (ref 39.0–52.0)
Hemoglobin: 16.1 g/dL (ref 13.0–17.0)
Hemoglobin: 15.5 g/dL (ref 13.0–17.0)
MCH: 32 pg (ref 26.0–34.0)
MCH: 31.5 pg (ref 26.0–34.0)
MCHC: 35.3 g/dL (ref 30.0–36.0)
MCHC: 34.4 g/dL (ref 30.0–36.0)
MCV: 90.7 fL (ref 78.0–100.0)
MCV: 91.5 fL (ref 78.0–100.0)
PLATELETS: 208 10*3/uL (ref 150–400)
Platelets: 224 10*3/uL (ref 150–400)
RBC: 5.03 MIL/uL (ref 4.22–5.81)
RBC: 4.92 MIL/uL (ref 4.22–5.81)
RDW: 13.9 % (ref 11.5–15.5)
RDW: 14.2 % (ref 11.5–15.5)
WBC: 8.6 10*3/uL (ref 4.0–10.5)
WBC: 7 10*3/uL (ref 4.0–10.5)

## 2014-03-19 LAB — URINALYSIS, ROUTINE W REFLEX MICROSCOPIC
Bilirubin Urine: NEGATIVE
Glucose, UA: NEGATIVE mg/dL
Hgb urine dipstick: NEGATIVE
Ketones, ur: NEGATIVE mg/dL
LEUKOCYTES UA: NEGATIVE
NITRITE: NEGATIVE
Protein, ur: NEGATIVE mg/dL
SPECIFIC GRAVITY, URINE: 1.018 (ref 1.005–1.030)
UROBILINOGEN UA: 0.2 mg/dL (ref 0.0–1.0)
pH: 7 (ref 5.0–8.0)

## 2014-03-19 LAB — I-STAT CHEM 8, ED
BUN: 10 mg/dL (ref 6–23)
Calcium, Ion: 1.2 mmol/L (ref 1.12–1.23)
Chloride: 106 mEq/L (ref 96–112)
Creatinine, Ser: 1 mg/dL (ref 0.50–1.35)
Glucose, Bld: 101 mg/dL — ABNORMAL HIGH (ref 70–99)
HEMATOCRIT: 51 % (ref 39.0–52.0)
HEMOGLOBIN: 17.3 g/dL — AB (ref 13.0–17.0)
POTASSIUM: 4 meq/L (ref 3.7–5.3)
SODIUM: 141 meq/L (ref 137–147)
TCO2: 24 mmol/L (ref 0–100)

## 2014-03-19 LAB — MRSA PCR SCREENING: MRSA BY PCR: NEGATIVE

## 2014-03-19 LAB — PHENYTOIN LEVEL, TOTAL: Phenytoin Lvl: <2.5 ug/mL — ABNORMAL LOW (ref 10.0–20.0)

## 2014-03-19 MED ORDER — LABETALOL HCL 5 MG/ML IV SOLN
0.5000 mg/min | INTRAVENOUS | Status: DC
  Administered 2014-03-19: 0.5 mg/min via INTRAVENOUS
  Filled 2014-03-19: qty 100

## 2014-03-19 MED ORDER — HYDRALAZINE HCL 50 MG PO TABS
50.0000 mg | ORAL_TABLET | Freq: Three times a day (TID) | ORAL | Status: DC
  Administered 2014-03-19 – 2014-03-20 (×5): 50 mg via ORAL
  Filled 2014-03-19 (×10): qty 1

## 2014-03-19 MED ORDER — SODIUM CHLORIDE 0.9 % IJ SOLN
3.0000 mL | Freq: Two times a day (BID) | INTRAMUSCULAR | Status: DC
Start: 2014-03-19 — End: 2014-03-20
  Administered 2014-03-19 – 2014-03-20 (×2): 3 mL via INTRAVENOUS

## 2014-03-19 MED ORDER — ONDANSETRON HCL 4 MG/2ML IJ SOLN: 4.0000 mg | Freq: Four times a day (QID) | INTRAMUSCULAR | Status: DC | PRN

## 2014-03-19 MED ORDER — AMLODIPINE BESYLATE 10 MG PO TABS
10.0000 mg | ORAL_TABLET | Freq: Every day | ORAL | Status: DC
  Administered 2014-03-19 – 2014-03-20 (×2): 10 mg via ORAL
  Filled 2014-03-19 (×2): qty 1

## 2014-03-19 MED ORDER — VITAMIN D (ERGOCALCIFEROL) 1.25 MG (50000 UNIT) PO CAPS
50000.0000 [IU] | ORAL_CAPSULE | ORAL | Status: DC
  Administered 2014-03-19: 50000 [IU] via ORAL
  Filled 2014-03-19: qty 1

## 2014-03-19 MED ORDER — ACETAMINOPHEN 325 MG PO TABS: 650.0000 mg | ORAL_TABLET | Freq: Four times a day (QID) | ORAL | Status: DC | PRN

## 2014-03-19 MED ORDER — NICOTINE 21 MG/24HR TD PT24
21.0000 mg | MEDICATED_PATCH | Freq: Every day | TRANSDERMAL | Status: DC
  Administered 2014-03-19 – 2014-03-20 (×2): 21 mg via TRANSDERMAL
  Filled 2014-03-19 (×2): qty 1

## 2014-03-19 MED ORDER — HYDRALAZINE HCL 20 MG/ML IJ SOLN: 10.0000 mg | Freq: Four times a day (QID) | INTRAMUSCULAR | Status: DC | PRN

## 2014-03-19 MED ORDER — ALBUTEROL SULFATE HFA 108 (90 BASE) MCG/ACT IN AERS: 1.0000 | INHALATION_SPRAY | Freq: Four times a day (QID) | RESPIRATORY_TRACT | Status: DC | PRN

## 2014-03-19 MED ORDER — PHENYTOIN 50 MG PO CHEW
100.0000 mg | CHEWABLE_TABLET | Freq: Two times a day (BID) | ORAL | Status: DC
  Administered 2014-03-19 – 2014-03-20 (×3): 100 mg via ORAL
  Filled 2014-03-19 (×5): qty 2

## 2014-03-19 MED ORDER — ATENOLOL 50 MG PO TABS
50.0000 mg | ORAL_TABLET | Freq: Every day | ORAL | Status: DC
  Administered 2014-03-19 – 2014-03-20 (×2): 50 mg via ORAL
  Filled 2014-03-19: qty 1
  Filled 2014-03-19: qty 2

## 2014-03-19 MED ORDER — ONDANSETRON HCL 4 MG PO TABS: 4.0000 mg | ORAL_TABLET | Freq: Four times a day (QID) | ORAL | Status: DC | PRN

## 2014-03-19 MED ORDER — ACETAMINOPHEN 650 MG RE SUPP: 650.0000 mg | Freq: Four times a day (QID) | RECTAL | Status: DC | PRN

## 2014-03-19 MED ORDER — ALBUTEROL SULFATE (2.5 MG/3ML) 0.083% IN NEBU: 3.0000 mL | INHALATION_SOLUTION | Freq: Four times a day (QID) | RESPIRATORY_TRACT | Status: DC | PRN

## 2014-03-19 NOTE — ED Notes (Signed)
Called pharmacy to inquire about the ergocaliferol and dilantin.  Will be sent soon

## 2014-03-19 NOTE — Progress Notes (Signed)
BP 125/85  Pulse 64  Temp(Src) 97.6 F (36.4 C) (Oral)  Resp 19  Ht 5\' 9"  (1.753 m)  Wt 102.059 kg (225 lb)  BMI 33.21 kg/m2  SpO2 99% - Bp stable, resumed home medication. Off drip. - transfer to regular floor. - ASA on hold, no further bleeding.

## 2014-03-19 NOTE — ED Notes (Signed)
Pt placed on 3L blow by, 02 sats reading 88% on RA when pt is resting.

## 2014-03-19 NOTE — ED Notes (Signed)
Pt placed back on RA per MD order, does not want to dry out pt's nose to increase potential risk of re-bleeding.
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
## 2014-03-19 NOTE — ED Notes (Signed)
No blood draining from pt's nose.
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
## 2014-03-19 NOTE — ED Provider Notes (Signed)
CSN: [id number]     Arrival date & time 03/19/14  0020 History   First MD Initiated Contact with Patient 03/19/14 0122     Chief Complaint  Patient presents with  . Hypertension     (Consider location/radiation/quality/duration/timing/severity/associated sxs/prior Treatment) The history is provided by the patient.   History provided by patient. History of hypertension, last took his medications yesterday morning, tonight developed right-sided epistaxis. No trauma. Denies recent cocaine use. In triage was noted to have elevated blood pressure in the time of my evaluation was 230/130. No chest pain or shortness of breath. No headache or weakness. Takes daily ASA/ No blood thinners o/w. Symptoms MOD in severity. No h/o same.   Past Medical History  Diagnosis Date  . Hypertension   . Seizures     last episode 03/2013  . Asthma   . Brain bleed    Past Surgical History  Procedure Laterality Date  . Leg surgery     Family History  Problem Relation Age of Onset  . Diabetes Mellitus II Sister   . Cancer Father    History  Substance Use Topics  . Smoking status: Current Every Day Smoker -- 1.00 packs/day for 26 years    Types: Cigarettes  . Smokeless tobacco: Never Used  . Alcohol Use: 2.4 oz/week    4 Cans of beer per week     Comment: drinks heavily. "pt sts he only drinks one beer a day now"    Review of Systems  Constitutional: Negative for fever and chills.  HENT: Positive for nosebleeds.   Eyes: Negative for visual disturbance.  Respiratory: Negative for shortness of breath.   Cardiovascular: Negative for chest pain.  Gastrointestinal: Negative for vomiting and abdominal pain.  Genitourinary: Negative for difficulty urinating.  Musculoskeletal: Negative for back pain, neck pain and neck stiffness.  Skin: Negative for rash.  Neurological: Negative for weakness, numbness and headaches.  All other systems reviewed and are negative.      Allergies  Dilaudid  Home  Medications   Current Outpatient Rx  Name  Route  Sig  Dispense  Refill  . amLODipine (NORVASC) 10 MG tablet   Oral   Take 1 tablet (10 mg total) by mouth daily.   30 tablet   1   . aspirin 325 MG buffered tablet   Oral   Take 1 tablet (325 mg total) by mouth daily.   30 tablet   0   . atenolol (TENORMIN) 50 MG tablet   Oral   Take 1 tablet (50 mg total) by mouth daily.   30 tablet   3   . hydrALAZINE (APRESOLINE) 50 MG tablet   Oral   Take 1 tablet (50 mg total) by mouth every 8 (eight) hours.   90 tablet   3   . phenytoin (DILANTIN INFATABS) 50 MG tablet   Oral   Chew 100 mg by mouth 2 (two) times daily.         Marland Kitchen PROAIR HFA 108 (90 BASE) MCG/ACT inhaler   Inhalation   Inhale 1-2 puffs into the lungs every 6 (six) hours as needed for wheezing or shortness of breath.          . Vitamin D, Ergocalciferol, (DRISDOL) 50000 UNITS CAPS capsule   Oral   Take 1 capsule (50,000 Units total) by mouth every 7 (seven) days.   12 capsule   0    BP 201/139  Pulse 64  Temp(Src) 98.2 F (36.8 C) (Oral)  Resp 12  Ht 5\' 9"  (1.753 m)  Wt 225 lb (102.059 kg)  BMI 33.21 kg/m2  SpO2 98% Physical Exam  Constitutional: He is oriented to person, place, and time. He appears well-developed and well-nourished.  HENT:  Head: Normocephalic and atraumatic.  Right-sided active epistaxis, unable to visualize bleed site. Posterior pharynx is clear  Eyes: EOM are normal. Pupils are equal, round, and reactive to light.  Neck: Neck supple.  Cardiovascular: Normal rate, regular rhythm and intact distal pulses.   Pulmonary/Chest: Effort normal and breath sounds normal. No respiratory distress.  Abdominal: Soft. There is no tenderness.  Musculoskeletal: Normal range of motion. He exhibits no edema and no tenderness.  Neurological: He is alert and oriented to person, place, and time. No cranial nerve deficit. Coordination normal.  Skin: Skin is warm and dry.    ED Course  Procedures  (including critical care time) Labs Review Labs Reviewed  I-STAT CHEM 8, ED - Abnormal; Notable for the following:    Glucose, Bld 101 (*)    Hemoglobin 17.3 (*)    All other components within normal limits  CBC  URINALYSIS, ROUTINE W REFLEX MICROSCOPIC   Imaging Review Dg Chest Portable 1 View  03/19/2014   CLINICAL DATA:  Hypertension and right upper chest pain.  EXAM: PORTABLE CHEST - 1 VIEW  COMPARISON:  06/18/2013  FINDINGS: Lungs are adequately inflated without consolidation or effusion. Cardiomediastinal silhouette and remainder of the exam is unchanged.  IMPRESSION: No active disease.   Electronically Signed   By: Marin Olp M.D.   On: 03/19/2014 01:59     EKG Interpretation   Date/Time:  Monday March 19 2014 01:36:58 EDT Ventricular Rate:  66 PR Interval:  167 QRS Duration: 94 QT Interval:  416 QTC Calculation: 436 R Axis:   71 Text Interpretation:  Sinus rhythm Left ventricular hypertrophy  Nonspecific ST abnormality tall T waves No significant change since last  tracing Confirmed by Yanira Tolsma  MD, Jorgen Wolfinger ([id number]) on 03/19/2014 1:52:21 AM     CRITICAL CARE Performed by: Teressa Lower Total critical care time: 35 Critical care time was exclusive of separately billable procedures and treating other patients. Critical care was necessary to treat or prevent imminent or life-threatening deterioration. Critical care was time spent personally by me on the following activities: development of treatment plan with patient and/or surrogate as well as nursing, discussions with consultants, evaluation of patient's response to treatment, examination of patient, obtaining history from patient or surrogate, ordering and performing treatments and interventions, ordering and review of laboratory studies, ordering and review of radiographic studies, pulse oximetry and re-evaluation of patient's condition. IV labetalol for malignant hypertension.   epistatxis care - Afrin-soaked cotton ball placed  in right near after patient blew his nose/ removed clots. On recheck patient had removed the cotton ball, stated it was soaked with blood. Recheck still unable to visualize bleed site and patient consented for anterior packing. Replacing packing and patient pulled away declined any further treatment for his his nose bleed.   Blood pressure control, bleeding improved  2:31 AM Dr Reece Levy to eval, no SDU beds available.   MDM   Final diagnoses:  Bleeding from the nose  Hypertensive urgency  Seizure disorder  Tobacco use disorder   Evaluate labs and imaging reviewed as above. IV labetalol hypertensive urgency Epistaxis care provided as above, patient declines any further treatment. ECG reviewed, serial evaluations, improving condition.  MED admit   Teressa Lower, MD 03/19/14 [phone]

## 2014-03-19 NOTE — ED Notes (Signed)
Pt to ED via [facility] with c/o bleeding from right nare pt also hypertensive.  Pt st's nose was bleeding earlier today but stopped after EMS used Afrin nasal spray.  Pt  Has hx of HTN and st's he took his medication this am

## 2014-03-19 NOTE — Progress Notes (Signed)
RN informed CSW pt has difficulty obtaining transportation. Provided list of transportation companies/organizations that assist with this for Medicaid recipients. Social work signing off but can be re-consulted if needed.   Ky Barban, MSW, [employer] M.D. [facility] Clinical Social Worker [phone]

## 2014-03-20 DIAGNOSIS — R079 Chest pain, unspecified: Secondary | ICD-10-CM

## 2014-03-20 DIAGNOSIS — I10 Essential (primary) hypertension: Secondary | ICD-10-CM

## 2014-03-20 MED ORDER — ATENOLOL 50 MG PO TABS: 50.0000 mg | ORAL_TABLET | Freq: Every day | ORAL | Status: DC

## 2014-03-20 MED ORDER — HYDRALAZINE HCL 50 MG PO TABS
50.0000 mg | ORAL_TABLET | Freq: Three times a day (TID) | ORAL | Status: DC
Start: 2014-03-20 — End: 2018-06-21

## 2014-03-20 MED ORDER — HYDROCHLOROTHIAZIDE 12.5 MG PO TABS: 12.5000 mg | ORAL_TABLET | Freq: Every day | ORAL | Status: DC

## 2014-03-20 MED ORDER — AMLODIPINE BESYLATE 10 MG PO TABS: 10.0000 mg | ORAL_TABLET | Freq: Every day | ORAL | Status: DC
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
## 2014-03-20 NOTE — Progress Notes (Addendum)
On patient's discharge paperwork today it stated that on admission patient had wedding ring.  Patient reported losing 14k gold wedding band on 2 Heart or 2900. Night shift ([address] [place], Therapist, sports) notified [phone] of missing wedding ring and they reported they were unable to find ring, security was also called and they had no record of any wedding ring. Patient arrived to 5[facility] without wedding ring. Safety zone portal was done reporting incident per 2900 request.   Patient discharged to home. Discharge instructions and rx given and explained and patient stated understanding. IV was removed. Risk management's number was given to patient to follow up about missing wedding ring. Patient left unit in a stable condition with all other personal belongings.

## 2014-03-28 ENCOUNTER — Encounter: Payer: Self-pay | Admitting: Internal Medicine

## 2014-04-30 ENCOUNTER — Emergency Department (HOSPITAL_COMMUNITY): Payer: Medicare Other

## 2014-04-30 ENCOUNTER — Encounter (HOSPITAL_COMMUNITY): Payer: Self-pay | Admitting: Emergency Medicine

## 2014-04-30 ENCOUNTER — Emergency Department (HOSPITAL_COMMUNITY)
Admission: EM | Admit: 2014-04-30 | Discharge: 2014-04-30 | Disposition: A | Discharge status: Home / Self Care | Payer: Medicare Other | Attending: Emergency Medicine | Admitting: Emergency Medicine

## 2014-04-30 DIAGNOSIS — Z8673 Personal history of transient ischemic attack (TIA), and cerebral infarction without residual deficits: Secondary | ICD-10-CM | POA: Insufficient documentation

## 2014-04-30 DIAGNOSIS — Z79899 Other long term (current) drug therapy: Secondary | ICD-10-CM | POA: Insufficient documentation

## 2014-04-30 DIAGNOSIS — I10 Essential (primary) hypertension: Secondary | ICD-10-CM | POA: Insufficient documentation

## 2014-04-30 DIAGNOSIS — G40909 Epilepsy, unspecified, not intractable, without status epilepticus: Secondary | ICD-10-CM | POA: Insufficient documentation

## 2014-04-30 DIAGNOSIS — Z7982 Long term (current) use of aspirin: Secondary | ICD-10-CM | POA: Insufficient documentation

## 2014-04-30 DIAGNOSIS — G43909 Migraine, unspecified, not intractable, without status migrainosus: Secondary | ICD-10-CM

## 2014-04-30 DIAGNOSIS — F17200 Nicotine dependence, unspecified, uncomplicated: Secondary | ICD-10-CM | POA: Insufficient documentation

## 2014-04-30 DIAGNOSIS — J45909 Unspecified asthma, uncomplicated: Secondary | ICD-10-CM | POA: Insufficient documentation

## 2014-04-30 LAB — BASIC METABOLIC PANEL
BUN: 8 mg/dL (ref 6–23)
CHLORIDE: 107 meq/L (ref 96–112)
CO2: 21 mEq/L (ref 19–32)
CREATININE: 0.91 mg/dL (ref 0.50–1.35)
Calcium: 8.8 mg/dL (ref 8.4–10.5)
GFR calc Af Amer: >90 mL/min (ref >90)
GFR calc non Af Amer: >90 mL/min (ref >90)
Glucose, Bld: 98 mg/dL (ref 70–99)
Potassium: 4 mEq/L (ref 3.7–5.3)
Sodium: 143 mEq/L (ref 137–147)

## 2014-04-30 LAB — RAPID URINE DRUG SCREEN, HOSP PERFORMED
AMPHETAMINES: NOT DETECTED
Barbiturates: NOT DETECTED
Benzodiazepines: NOT DETECTED
Cocaine: POSITIVE — AB
OPIATES: NOT DETECTED
Tetrahydrocannabinol: NOT DETECTED

## 2014-04-30 LAB — URINALYSIS, ROUTINE W REFLEX MICROSCOPIC
Bilirubin Urine: NEGATIVE
GLUCOSE, UA: NEGATIVE mg/dL
Hgb urine dipstick: NEGATIVE
Ketones, ur: NEGATIVE mg/dL
LEUKOCYTES UA: NEGATIVE
NITRITE: NEGATIVE
PROTEIN: NEGATIVE mg/dL
Specific Gravity, Urine: 1.014 (ref 1.005–1.030)
Urobilinogen, UA: 0.2 mg/dL (ref 0.0–1.0)
pH: 5 (ref 5.0–8.0)

## 2014-04-30 LAB — CBC WITH DIFFERENTIAL/PLATELET
BASOS ABS: 0 10*3/uL (ref 0.0–0.1)
Basophils Relative: 0 % (ref 0–1)
Eosinophils Absolute: 0 10*3/uL (ref 0.0–0.7)
Eosinophils Relative: 0 % (ref 0–5)
HEMATOCRIT: 43.9 % (ref 39.0–52.0)
HEMOGLOBIN: 15.3 g/dL (ref 13.0–17.0)
Lymphocytes Relative: 15 % (ref 12–46)
Lymphs Abs: 1.6 10*3/uL (ref 0.7–4.0)
MCH: 31.7 pg (ref 26.0–34.0)
MCHC: 34.9 g/dL (ref 30.0–36.0)
MCV: 90.9 fL (ref 78.0–100.0)
MONO ABS: 0.8 10*3/uL (ref 0.1–1.0)
MONOS PCT: 7 % (ref 3–12)
NEUTROS ABS: 8.2 10*3/uL — AB (ref 1.7–7.7)
Neutrophils Relative %: 78 % — ABNORMAL HIGH (ref 43–77)
Platelets: 264 10*3/uL (ref 150–400)
RBC: 4.83 MIL/uL (ref 4.22–5.81)
RDW: 14.3 % (ref 11.5–15.5)
WBC: 10.6 10*3/uL — AB (ref 4.0–10.5)

## 2014-04-30 LAB — ETHANOL: Alcohol, Ethyl (B): 42 mg/dL — ABNORMAL HIGH (ref 0–11)

## 2014-04-30 MED ORDER — KETOROLAC TROMETHAMINE 30 MG/ML IJ SOLN
30.0000 mg | Freq: Once | INTRAMUSCULAR | Status: AC
  Administered 2014-04-30: 30 mg via INTRAVENOUS
  Filled 2014-04-30: qty 1

## 2014-04-30 MED ORDER — METOCLOPRAMIDE HCL 5 MG/ML IJ SOLN
10.0000 mg | Freq: Once | INTRAMUSCULAR | Status: AC
  Administered 2014-04-30: 10 mg via INTRAVENOUS
  Filled 2014-04-30: qty 2

## 2014-04-30 MED ORDER — SODIUM CHLORIDE 0.9 % IV BOLUS (SEPSIS)
500.0000 mL | Freq: Once | INTRAVENOUS | Status: AC
  Administered 2014-04-30: 10:00:00 via INTRAVENOUS

## 2014-04-30 MED ORDER — DIPHENHYDRAMINE HCL 50 MG/ML IJ SOLN
25.0000 mg | Freq: Once | INTRAMUSCULAR | Status: AC
  Administered 2014-04-30: 25 mg via INTRAVENOUS
  Filled 2014-04-30: qty 1

## 2014-04-30 NOTE — ED Notes (Signed)
Per EMS patient reports headache started this am unsure of time.  EMS reports eyes are blood shot.  BP 160/110 HR120 RR14 O2 93% RA.  Pt has hx of HBP and took BP meds around 9am yesterday morning. PERRL, no neuro deficits.  No blurred vision.  Pain 10/10

## 2014-04-30 NOTE — ED Provider Notes (Signed)
CSN: [id number]     Arrival date & time 04/30/14  0554 History   First MD Initiated Contact with Patient 04/30/14 0630     Chief Complaint  Patient presents with  . Headache  . Hypertension     (Consider location/radiation/quality/duration/timing/severity/associated sxs/prior Treatment) HPI Comments: Patient is a 43 year old male with a past medical history of seizure disorder, hypertension, TIA, brain bleed, and chronic frontal sinus who presents to the ED via EMS with a headache. Per EMS, patient had a headache that started this morning. Patient is a poor historian and is unable to tell me much about his headache. He denies any other symptoms. He did not try anything for pain. Patient took his BP medications yesterday morning and did not take them today. Patient reports his friend called EMS because "he didn't look right." No other associated symptoms. Patient denies recent drug and/or alcohol use.    Past Medical History  Diagnosis Date  . Hypertension   . Seizures     last episode 03/2013  . Asthma   . Brain bleed    Past Surgical History  Procedure Laterality Date  . Leg surgery     Family History  Problem Relation Age of Onset  . Diabetes Mellitus II Sister   . Cancer Father    History  Substance Use Topics  . Smoking status: Current Every Day Smoker -- 1.00 packs/day for 26 years    Types: Cigarettes  . Smokeless tobacco: Never Used  . Alcohol Use: 2.4 oz/week    4 Cans of beer per week     Comment: drinks heavily. "pt sts he only drinks one beer a day now"    Review of Systems  Constitutional: Negative for fever, chills and fatigue.  HENT: Negative for trouble swallowing.   Eyes: Negative for visual disturbance.  Respiratory: Negative for shortness of breath.   Cardiovascular: Negative for chest pain and palpitations.  Gastrointestinal: Negative for nausea, vomiting, abdominal pain and diarrhea.  Genitourinary: Negative for dysuria and difficulty urinating.   Musculoskeletal: Negative for arthralgias and neck pain.  Skin: Negative for color change.  Neurological: Positive for headaches. Negative for dizziness and weakness.  Psychiatric/Behavioral: Negative for dysphoric mood.      Allergies  Dilaudid  Home Medications   Prior to Admission medications   Medication Sig Start Date End Date Taking? Authorizing Provider  amLODipine (NORVASC) 10 MG tablet Take 1 tablet (10 mg total) by mouth daily. 03/20/14  Yes Charlynne Cousins, MD  aspirin 325 MG buffered tablet Take 1 tablet (325 mg total) by mouth daily. 12/25/13  Yes Thurnell Lose, MD  atenolol (TENORMIN) 50 MG tablet Take 1 tablet (50 mg total) by mouth daily. 03/20/14  Yes Charlynne Cousins, MD  hydrALAZINE (APRESOLINE) 50 MG tablet Take 1 tablet (50 mg total) by mouth every 8 (eight) hours. 03/20/14  Yes Charlynne Cousins, MD  hydrochlorothiazide (HYDRODIURIL) 12.5 MG tablet Take 1 tablet (12.5 mg total) by mouth daily. 03/20/14  Yes Charlynne Cousins, MD  phenytoin (DILANTIN INFATABS) 50 MG tablet Chew 100 mg by mouth 2 (two) times daily.   Yes Historical Provider, MD  PROAIR HFA 108 (90 BASE) MCG/ACT inhaler Inhale 1-2 puffs into the lungs every 6 (six) hours as needed for wheezing or shortness of breath.  09/15/13  Yes Historical Provider, MD  Vitamin D, Ergocalciferol, (DRISDOL) 50000 UNITS CAPS capsule Take 1 capsule (50,000 Units total) by mouth every 7 (seven) days. 12/12/13  Yes  [name], MD   BP 166/115  Pulse 105  Temp(Src) 98.2 F (36.8 C) (Oral)  Resp 20  SpO2 89% Physical Exam  Nursing note and vitals reviewed. Constitutional: He is oriented to person, place, and time. He appears well-developed and well-nourished. No distress.  Patient appears drowsy.   HENT:  Head: Normocephalic and atraumatic.  Eyes: EOM are normal. Pupils are equal, round, and reactive to light.  Mild bilateral conjunctival injection noted.   Neck: Normal range of motion. Neck supple.   Cardiovascular: Normal rate and regular rhythm.  Exam reveals no gallop and no friction rub.   No murmur heard. Pulmonary/Chest: Effort normal and breath sounds normal. He has no wheezes. He has no rales. He exhibits no tenderness.  Abdominal: Soft. He exhibits no distension. There is no tenderness. There is no rebound.  Musculoskeletal: Normal range of motion.  Neurological: He is alert and oriented to person, place, and time. Coordination normal.  Extremity strength and sensation equal and intact bilaterally. Speech is slurred. Moves limbs without ataxia.   Skin: Skin is warm and dry.  Psychiatric: He has a normal mood and affect. His behavior is normal.    ED Course  Procedures (including critical care time) Labs Review Labs Reviewed  CBC WITH DIFFERENTIAL - Abnormal; Notable for the following:    WBC 10.6 (*)    Neutrophils Relative % 78 (*)    Neutro Abs 8.2 (*)    All other components within normal limits  URINE RAPID DRUG SCREEN (HOSP PERFORMED) - Abnormal; Notable for the following:    Cocaine POSITIVE (*)    All other components within normal limits  ETHANOL - Abnormal; Notable for the following:    Alcohol, Ethyl (B) 42 (*)    All other components within normal limits  BASIC METABOLIC PANEL  URINALYSIS, ROUTINE W REFLEX MICROSCOPIC  PHENYTOIN LEVEL, FREE    Imaging Review Dg Chest 2 View  04/30/2014   CLINICAL DATA:  Hypoxia  EXAM: CHEST  2 VIEW  COMPARISON:  March 19, 2014  FINDINGS: There is no edema or consolidation. Heart is upper normal in size with normal pulmonary vascularity. No adenopathy. No bone lesions.  IMPRESSION: No edema or consolidation.   Electronically Signed   By: Lowella Grip M.D.   On: 04/30/2014 07:15   Ct Head Wo Contrast  04/30/2014   CLINICAL DATA:  Headache, hypertension  EXAM: CT HEAD WITHOUT CONTRAST  TECHNIQUE: Contiguous axial images were obtained from the base of the skull through the vertex without intravenous contrast.  COMPARISON:   Prior head CT 12/30/2013 ; prior MRI head 12/25/2013  FINDINGS: Negative for acute intracranial hemorrhage, acute infarction, mass, mass effect, hydrocephalus or midline shift. Gray-white differentiation is preserved throughout. Stable encephalomalacia of the inferior left frontal lobe. Mild periventricular known and subcortical white matter hypoattenuation consistent with sequelae of longstanding microvascular ischemic disease. Incompletely imaged chronic disease to the left maxillary sinus. No acute soft tissue or calvarial abnormality.  IMPRESSION: 1. No acute intracranial process. 2. No interval change in the appearance of the prior inferior left frontal lobe insult.   Electronically Signed   By: Jacqulynn Cadet M.D.   On: 04/30/2014 08:06     EKG Interpretation None      MDM   Final diagnoses:  Migraine    7:29 AM Labs and chest xray unremarkable. Patient is currently on nasal cannula 2L. Patient will have dilantin level, ETOH, and UDS. Patient will have CT head to rule out  intracranial etiology.   8:59 AM CT unremarkable for acute changes. Patient's alcohol level is 45. Patient UDS pending. Patient will have fluids, toradol, reglan, benadryl for headache.   10:50 AM Patient reports relief of his headache. UDS shows cocaine. Patient possible having symptoms due to substance abuse. He has no neuro deficits and reports relief of symptoms. I am not concerned for life threatening etiology of presenting symptoms. Patient advised to follow up with PCP. Vitals stable and patient afebrile.   Alvina Chou, PA-C 04/30/14 1544

## 2014-04-30 NOTE — ED Provider Notes (Signed)
Medical screening examination/treatment/procedure(s) were performed by non-physician practitioner and as supervising physician I was immediately available for consultation/collaboration.   EKG Interpretation None        Merryl Hacker, MD 04/30/14 2320

## 2014-04-30 NOTE — Discharge Instructions (Signed)
Follow up with your doctor for further evaluation of your symptoms. Return to the ED with worsening or concerning symptoms.

## 2014-04-30 NOTE — ED Notes (Signed)
Bed: WA06 Expected date:  Expected time:  Means of arrival:  Comments: Ems/42 yo male-headache from hypertension

## 2014-05-03 ENCOUNTER — Ambulatory Visit: Payer: Medicare Other | Admitting: Internal Medicine

## 2014-05-03 LAB — PHENYTOIN LEVEL, FREE AND TOTAL: Phenytoin, Total: <2.5 mg/L (ref 10.0–20.0)

## 2014-06-19 ENCOUNTER — Other Ambulatory Visit: Payer: Self-pay | Admitting: Nurse Practitioner

## 2014-06-20 ENCOUNTER — Emergency Department (HOSPITAL_COMMUNITY): Payer: Medicare Other

## 2014-06-20 ENCOUNTER — Emergency Department (HOSPITAL_COMMUNITY)
Admission: EM | Admit: 2014-06-20 | Discharge: 2014-06-20 | Disposition: A | Discharge status: Home / Self Care | Payer: Medicare Other | Attending: Emergency Medicine | Admitting: Emergency Medicine

## 2014-06-20 DIAGNOSIS — Z7982 Long term (current) use of aspirin: Secondary | ICD-10-CM | POA: Insufficient documentation

## 2014-06-20 DIAGNOSIS — Z79899 Other long term (current) drug therapy: Secondary | ICD-10-CM | POA: Diagnosis not present

## 2014-06-20 DIAGNOSIS — H5789 Other specified disorders of eye and adnexa: Secondary | ICD-10-CM | POA: Insufficient documentation

## 2014-06-20 DIAGNOSIS — I10 Essential (primary) hypertension: Secondary | ICD-10-CM | POA: Diagnosis not present

## 2014-06-20 DIAGNOSIS — R55 Syncope and collapse: Secondary | ICD-10-CM | POA: Diagnosis not present

## 2014-06-20 DIAGNOSIS — R079 Chest pain, unspecified: Secondary | ICD-10-CM | POA: Diagnosis present

## 2014-06-20 DIAGNOSIS — R51 Headache: Secondary | ICD-10-CM | POA: Diagnosis not present

## 2014-06-20 DIAGNOSIS — G40909 Epilepsy, unspecified, not intractable, without status epilepticus: Secondary | ICD-10-CM | POA: Insufficient documentation

## 2014-06-20 DIAGNOSIS — F17200 Nicotine dependence, unspecified, uncomplicated: Secondary | ICD-10-CM | POA: Insufficient documentation

## 2014-06-20 DIAGNOSIS — J45909 Unspecified asthma, uncomplicated: Secondary | ICD-10-CM | POA: Insufficient documentation

## 2014-06-20 LAB — CBC
HEMATOCRIT: 48.7 % (ref 39.0–52.0)
Hemoglobin: 16.9 g/dL (ref 13.0–17.0)
MCH: 31.8 pg (ref 26.0–34.0)
MCHC: 34.7 g/dL (ref 30.0–36.0)
MCV: 91.7 fL (ref 78.0–100.0)
Platelets: 258 10*3/uL (ref 150–400)
RBC: 5.31 MIL/uL (ref 4.22–5.81)
RDW: 14.7 % (ref 11.5–15.5)
WBC: 11.4 10*3/uL — AB (ref 4.0–10.5)

## 2014-06-20 LAB — I-STAT TROPONIN, ED
TROPONIN I, POC: 0 ng/mL (ref 0.00–0.08)
Troponin i, poc: 0 ng/mL (ref 0.00–0.08)

## 2014-06-20 LAB — PRO B NATRIURETIC PEPTIDE: Pro B Natriuretic peptide (BNP): 13.5 pg/mL (ref 0–125)

## 2014-06-20 LAB — BASIC METABOLIC PANEL
Anion gap: 17 — ABNORMAL HIGH (ref 5–15)
BUN: 8 mg/dL (ref 6–23)
CHLORIDE: 101 meq/L (ref 96–112)
CO2: 22 mEq/L (ref 19–32)
CREATININE: 1.11 mg/dL (ref 0.50–1.35)
Calcium: 9.5 mg/dL (ref 8.4–10.5)
GFR calc Af Amer: >90 mL/min (ref >90)
GFR calc non Af Amer: 80 mL/min — ABNORMAL LOW (ref >90)
GLUCOSE: 93 mg/dL (ref 70–99)
Potassium: 4 mEq/L (ref 3.7–5.3)
Sodium: 140 mEq/L (ref 137–147)

## 2014-06-20 MED ORDER — NITROGLYCERIN 0.4 MG SL SUBL
0.4000 mg | SUBLINGUAL_TABLET | SUBLINGUAL | Status: DC | PRN
  Administered 2014-06-20: 0.4 mg via SUBLINGUAL

## 2014-06-20 NOTE — ED Notes (Signed)
Per EMS, patient was walking and passed out and woke up on ground, called 911. BP 150/100. Hx of hypertension, can't remember his BP meds but he reports being compliant. Wheezing. 5mg  of albuterol.  Clear now. Chest pain in center, reproducable when pressed.  Intermittent headache, and left thigh pain. Chronic chest pain. Reports falling.  Elevation noted in v2 and v3. Last bp 155/103. Patient taked dilantin for seizures.  No allergies. Patient is homeless, picked up on [place] in front of a church. Yellow eyes.

## 2014-06-20 NOTE — ED Provider Notes (Signed)
CSN: [id number]     Arrival date & time 06/20/14  0547 History   First MD Initiated Contact with Patient 06/20/14 [phone]     Chief Complaint  Patient presents with  . Chest Pain     (Consider location/radiation/quality/duration/timing/severity/associated sxs/prior Treatment) Patient is a 43 y.o. male presenting with chest pain. The history is provided by the patient and the EMS personnel.  Chest Pain Associated symptoms: headache   Associated symptoms: no abdominal pain, no back pain, no fever and no shortness of breath    patient is having recollection of being brought in by EMS. Patient was brought in by EMS for passing out on the ground in public. Patient has long history of drinking heavy. Patient supposedly was wheezing and was given albuterol. Patient has a past history of wheezing, but is not currently on albuterol. States he's not supposed to be. Patients on Dilantin for seizures. Patient reports being homeless. Patient complaining here of chest pain substernal and a headache posterior headache. No other complaints.  Past Medical History  Diagnosis Date  . Hypertension   . Seizures     last episode 03/2013  . Asthma   . Brain bleed    Past Surgical History  Procedure Laterality Date  . Leg surgery     Family History  Problem Relation Age of Onset  . Diabetes Mellitus II Sister   . Cancer Father    History  Substance Use Topics  . Smoking status: Current Every Day Smoker -- 1.00 packs/day for 26 years    Types: Cigarettes  . Smokeless tobacco: Never Used  . Alcohol Use: 2.4 oz/week    4 Cans of beer per week     Comment: drinks heavily. "pt sts he only drinks one beer a day now"    Review of Systems  Constitutional: Negative for fever.  HENT: Negative for congestion.   Eyes: Positive for redness.  Respiratory: Negative for shortness of breath.   Cardiovascular: Positive for chest pain.  Gastrointestinal: Negative for abdominal pain.  Genitourinary: Negative for  dysuria.  Musculoskeletal: Negative for back pain.  Skin: Negative for rash.  Neurological: Positive for syncope and headaches.  Hematological: Does not bruise/bleed easily.      Allergies  Dilaudid  Home Medications   Prior to Admission medications   Medication Sig Start Date End Date Taking? Authorizing Provider  amLODipine (NORVASC) 10 MG tablet Take 1 tablet (10 mg total) by mouth daily. 03/20/14  Yes Charlynne Cousins, MD  aspirin 325 MG buffered tablet Take 1 tablet (325 mg total) by mouth daily. 12/25/13  Yes Thurnell Lose, MD  atenolol (TENORMIN) 50 MG tablet Take 1 tablet (50 mg total) by mouth daily. 03/20/14  Yes Charlynne Cousins, MD  hydrALAZINE (APRESOLINE) 50 MG tablet Take 1 tablet (50 mg total) by mouth every 8 (eight) hours. 03/20/14  Yes Charlynne Cousins, MD  hydrochlorothiazide (HYDRODIURIL) 12.5 MG tablet Take 1 tablet (12.5 mg total) by mouth daily. 03/20/14  Yes Charlynne Cousins, MD  phenytoin (DILANTIN INFATABS) 50 MG tablet Chew 100 mg by mouth 2 (two) times daily.   Yes Historical Provider, MD  Vitamin D, Ergocalciferol, (DRISDOL) 50000 UNITS CAPS capsule Take 1 capsule (50,000 Units total) by mouth every 7 (seven) days. 12/12/13  Yes Deepak Advani, MD   BP 138/84  Pulse 76  Temp(Src) 97.9 F (36.6 C) (Oral)  Resp 18  SpO2 92% Physical Exam  Nursing note and vitals reviewed. Constitutional: He appears well-developed  and well-nourished. No distress.  HENT:  Head: Normocephalic.  Mouth/Throat: Oropharynx is clear and moist.  Eyes: Conjunctivae are normal. Pupils are equal, round, and reactive to light.  Neck: Normal range of motion.  Cardiovascular: Normal rate and regular rhythm.   Pulmonary/Chest: Effort normal and breath sounds normal. No respiratory distress. He has no wheezes.  Abdominal: Soft. Bowel sounds are normal. There is no tenderness.  Musculoskeletal: Normal range of motion.  Neurological: He is alert. No cranial nerve deficit. He  exhibits normal muscle tone. Coordination normal.  Skin: Skin is warm. No rash noted.    ED Course  Procedures (including critical care time) Labs Review Labs Reviewed  CBC - Abnormal; Notable for the following:    WBC 11.4 (*)    All other components within normal limits  BASIC METABOLIC PANEL - Abnormal; Notable for the following:    GFR calc non Af Amer 80 (*)    Anion gap 17 (*)    All other components within normal limits  PRO B NATRIURETIC PEPTIDE  I-STAT TROPOININ, ED  I-STAT TROPOININ, ED   Results for orders placed during the hospital encounter of 06/20/14  CBC      Result Value Ref Range   WBC 11.4 (*) 4.0 - 10.5 K/uL   RBC 5.31  4.22 - 5.81 MIL/uL   Hemoglobin 16.9  13.0 - 17.0 g/dL   HCT 48.7  39.0 - 52.0 %   MCV 91.7  78.0 - 100.0 fL   MCH 31.8  26.0 - 34.0 pg   MCHC 34.7  30.0 - 36.0 g/dL   RDW 14.7  11.5 - 15.5 %   Platelets 258  150 - 400 K/uL  BASIC METABOLIC PANEL      Result Value Ref Range   Sodium 140  137 - 147 mEq/L   Potassium 4.0  3.7 - 5.3 mEq/L   Chloride 101  96 - 112 mEq/L   CO2 22  19 - 32 mEq/L   Glucose, Bld 93  70 - 99 mg/dL   BUN 8  6 - 23 mg/dL   Creatinine, Ser 1.11  0.50 - 1.35 mg/dL   Calcium 9.5  8.4 - 10.5 mg/dL   GFR calc non Af Amer 80 (*) >90 mL/min   GFR calc Af Amer >90  >90 mL/min   Anion gap 17 (*) 5 - 15  PRO B NATRIURETIC PEPTIDE      Result Value Ref Range   Pro B Natriuretic peptide (BNP) 13.5  0 - 125 pg/mL  I-STAT TROPOININ, ED      Result Value Ref Range   Troponin i, poc 0.00  0.00 - 0.08 ng/mL   Comment 3           I-STAT TROPOININ, ED      Result Value Ref Range   Troponin i, poc 0.00  0.00 - 0.08 ng/mL   Comment 3              Imaging Review Ct Head Wo Contrast  06/20/2014   CLINICAL DATA:  Headache.  EXAM: CT HEAD WITHOUT CONTRAST  TECHNIQUE: Contiguous axial images were obtained from the base of the skull through the vertex without intravenous contrast.  COMPARISON:  04/30/2014.  FINDINGS: Left  frontal encephalomalacia again noted is most likely from old stroke. No mass. No hydrocephalus. No hemorrhage. White matter changes noted consistent with chronic ischemia. No acute bony abnormality. Opacification left maxillary sinus with prominent thickening of the adjacent bony structures  consistent with chronic left maxillary sinusitis. Mastoids are clear. CT is stable from prior exam.  IMPRESSION: 1. Left frontal lobe encephalomalacia most consistent with prior infarct. Periventricular white-matter small-vessel chronic ischemia. CT unchanged from prior exam. 2. Chronic left maxillary sinusitis.   Electronically Signed   By: Marcello Moores  Register   On: 06/20/2014 08:06   Dg Chest Port 1 View  06/20/2014   CLINICAL DATA:  Chest and leg pain.  Smoker.  EXAM: PORTABLE CHEST - 1 VIEW  COMPARISON:  04/30/2014  FINDINGS: The heart size and mediastinal contours are within normal limits. Both lungs are clear. The visualized skeletal structures are unremarkable.  IMPRESSION: No active disease.   Electronically Signed   By: Lucienne Capers M.D.   On: 06/20/2014 06:23     EKG Interpretation   Date/Time:  Wednesday June 20 2014 05:54:07 EDT Ventricular Rate:  85 PR Interval:  160 QRS Duration: 106 QT Interval:  387 QTC Calculation: 460 R Axis:   49 Text Interpretation:  Sinus rhythm LAE, consider biatrial enlargement RSR'  in V1 or V2, right VCD or RVH Left ventricular hypertrophy ST elevation,  consider anterior injury vs early repol No significant change since last  tracing Reconfirmed by Lark Runk  MD, [place]munah Stegemann [id number]) on 06/20/2014 7:16:02 AM      MDM   Final diagnoses:  Chest pain, unspecified chest pain type    Patient found wondering the streets. Had passed out on the ground EMS contacted and patient brought in. Patient was given albuterol inhaler in route no wheezing here. Patient has a history of significant alcohol use. Head CT negative chest x-ray negative chest pain workup negative troponins x2  negative. Patient with complaint of headache and chest pain only at this time. No evidence of acute cardiac event.    Fredia Sorrow, MD 06/20/14 [phone]

## 2014-06-20 NOTE — Discharge Instructions (Signed)
Workup for the chest pain labs without significant abnormalities. Return as needed. Follow up with your doctor in the next few days.

## 2014-06-21 ENCOUNTER — Ambulatory Visit: Payer: PRIVATE HEALTH INSURANCE | Admitting: Nurse Practitioner

## 2014-06-22 ENCOUNTER — Telehealth: Payer: Self-pay | Admitting: Nurse Practitioner

## 2014-06-22 NOTE — Telephone Encounter (Signed)
Patient was no show for today's office appointment.  

## 2014-07-30 DIAGNOSIS — F17200 Nicotine dependence, unspecified, uncomplicated: Secondary | ICD-10-CM | POA: Diagnosis not present

## 2014-07-30 DIAGNOSIS — Z79899 Other long term (current) drug therapy: Secondary | ICD-10-CM | POA: Diagnosis not present

## 2014-07-30 DIAGNOSIS — R569 Unspecified convulsions: Secondary | ICD-10-CM | POA: Insufficient documentation

## 2014-07-30 DIAGNOSIS — I10 Essential (primary) hypertension: Secondary | ICD-10-CM | POA: Insufficient documentation

## 2014-07-30 DIAGNOSIS — J45901 Unspecified asthma with (acute) exacerbation: Secondary | ICD-10-CM | POA: Diagnosis not present

## 2014-07-30 DIAGNOSIS — M549 Dorsalgia, unspecified: Secondary | ICD-10-CM | POA: Diagnosis not present

## 2014-07-30 DIAGNOSIS — R079 Chest pain, unspecified: Secondary | ICD-10-CM | POA: Insufficient documentation

## 2014-07-30 DIAGNOSIS — R0789 Other chest pain: Secondary | ICD-10-CM | POA: Insufficient documentation

## 2014-07-30 DIAGNOSIS — Z7982 Long term (current) use of aspirin: Secondary | ICD-10-CM | POA: Insufficient documentation

## 2014-07-30 LAB — I-STAT TROPONIN, ED: Troponin i, poc: 0 ng/mL (ref 0.00–0.08)

## 2014-07-30 LAB — BASIC METABOLIC PANEL
Anion gap: 11 (ref 5–15)
BUN: 11 mg/dL (ref 6–23)
CHLORIDE: 103 meq/L (ref 96–112)
CO2: 26 meq/L (ref 19–32)
Calcium: 9.8 mg/dL (ref 8.4–10.5)
Creatinine, Ser: 1.02 mg/dL (ref 0.50–1.35)
GFR calc Af Amer: >90 mL/min (ref >90)
GFR, EST NON AFRICAN AMERICAN: 88 mL/min — AB (ref >90)
GLUCOSE: 100 mg/dL — AB (ref 70–99)
Potassium: 4.3 mEq/L (ref 3.7–5.3)
SODIUM: 140 meq/L (ref 137–147)

## 2014-07-30 LAB — CBC
HEMATOCRIT: 51 % (ref 39.0–52.0)
HEMOGLOBIN: 17.8 g/dL — AB (ref 13.0–17.0)
MCH: 32.4 pg (ref 26.0–34.0)
MCHC: 34.9 g/dL (ref 30.0–36.0)
MCV: 92.9 fL (ref 78.0–100.0)
Platelets: 282 10*3/uL (ref 150–400)
RBC: 5.49 MIL/uL (ref 4.22–5.81)
RDW: 14 % (ref 11.5–15.5)
WBC: 9.3 10*3/uL (ref 4.0–10.5)

## 2014-07-30 NOTE — ED Notes (Signed)
Patient here with chest pain which began yesterday around 0600. Pain is described as tightness. States some increase in pain with movement of arms. States exertional shortness of breath and mild nausea. Endorses cough which began "a couple weeks ago". EMS gave 324 ASA en route.

## 2014-07-31 ENCOUNTER — Emergency Department (HOSPITAL_COMMUNITY)
Admission: EM | Admit: 2014-07-31 | Discharge: 2014-07-31 | Disposition: A | Discharge status: Home / Self Care | Payer: PRIVATE HEALTH INSURANCE | Attending: Emergency Medicine | Admitting: Emergency Medicine

## 2014-07-31 ENCOUNTER — Emergency Department (HOSPITAL_COMMUNITY): Payer: PRIVATE HEALTH INSURANCE

## 2014-07-31 DIAGNOSIS — I10 Essential (primary) hypertension: Secondary | ICD-10-CM

## 2014-07-31 DIAGNOSIS — R0789 Other chest pain: Secondary | ICD-10-CM | POA: Diagnosis not present

## 2014-07-31 LAB — RAPID URINE DRUG SCREEN, HOSP PERFORMED
Amphetamines: NOT DETECTED
Barbiturates: NOT DETECTED
Benzodiazepines: NOT DETECTED
COCAINE: NOT DETECTED
Opiates: NOT DETECTED
TETRAHYDROCANNABINOL: NOT DETECTED

## 2014-07-31 LAB — D-DIMER, QUANTITATIVE (NOT AT ARMC): D DIMER QUANT: 0.32 ug{FEU}/mL (ref 0.00–0.48)

## 2014-07-31 LAB — TROPONIN I

## 2014-07-31 MED ORDER — ASPIRIN 81 MG PO CHEW: 324.0000 mg | CHEWABLE_TABLET | Freq: Once | ORAL | Status: DC

## 2014-07-31 NOTE — Discharge Instructions (Signed)
Chest Pain (Nonspecific) There is no evidence of a heart attack or blood clot in the lung. Take your medications as prescribed. Followup with your doctor for a stress test. Return to the ED if you develop new or worsening symptoms. It is often hard to give a specific diagnosis for the cause of chest pain. There is always a chance that your pain could be related to something serious, such as a heart attack or a blood clot in the lungs. You need to follow up with your health care provider for further evaluation. CAUSES   Heartburn.  Pneumonia or bronchitis.  Anxiety or stress.  Inflammation around your heart (pericarditis) or lung (pleuritis or pleurisy).  A blood clot in the lung.  A collapsed lung (pneumothorax). It can develop suddenly on its own (spontaneous pneumothorax) or from trauma to the chest.  Shingles infection (herpes zoster virus). The chest wall is composed of bones, muscles, and cartilage. Any of these can be the source of the pain.  The bones can be bruised by injury.  The muscles or cartilage can be strained by coughing or overwork.  The cartilage can be affected by inflammation and become sore (costochondritis). DIAGNOSIS  Lab tests or other studies may be needed to find the cause of your pain. Your health care provider may have you take a test called an ambulatory electrocardiogram (ECG). An ECG records your heartbeat patterns over a 24-hour period. You may also have other tests, such as:  Transthoracic echocardiogram (TTE). During echocardiography, sound waves are used to evaluate how blood flows through your heart.  Transesophageal echocardiogram (TEE).  Cardiac monitoring. This allows your health care provider to monitor your heart rate and rhythm in real time.  Holter monitor. This is a portable device that records your heartbeat and can help diagnose heart arrhythmias. It allows your health care provider to track your heart activity for several days, if  needed.  Stress tests by exercise or by giving medicine that makes the heart beat faster. TREATMENT   Treatment depends on what may be causing your chest pain. Treatment may include:  Acid blockers for heartburn.  Anti-inflammatory medicine.  Pain medicine for inflammatory conditions.  Antibiotics if an infection is present.  You may be advised to change lifestyle habits. This includes stopping smoking and avoiding alcohol, caffeine, and chocolate.  You may be advised to keep your head raised (elevated) when sleeping. This reduces the chance of acid going backward from your stomach into your esophagus. Most of the time, nonspecific chest pain will improve within 2-3 days with rest and mild pain medicine.  HOME CARE INSTRUCTIONS   If antibiotics were prescribed, take them as directed. Finish them even if you start to feel better.  For the next few days, avoid physical activities that bring on chest pain. Continue physical activities as directed.  Do not use any tobacco products, including cigarettes, chewing tobacco, or electronic cigarettes.  Avoid drinking alcohol.  Only take medicine as directed by your health care provider.  Follow your health care provider's suggestions for further testing if your chest pain does not go away.  Keep any follow-up appointments you made. If you do not go to an appointment, you could develop lasting (chronic) problems with pain. If there is any problem keeping an appointment, call to reschedule. SEEK MEDICAL CARE IF:   Your chest pain does not go away, even after treatment.  You have a rash with blisters on your chest.  You have a fever. SEEK  IMMEDIATE MEDICAL CARE IF:   You have increased chest pain or pain that spreads to your arm, neck, jaw, back, or abdomen.  You have shortness of breath.  You have an increasing cough, or you cough up blood.  You have severe back or abdominal pain.  You feel nauseous or vomit.  You have severe  weakness.  You faint.  You have chills. This is an emergency. Do not wait to see if the pain will go away. Get medical help at once. Call your local emergency services (911 in [place] Do not drive yourself to the hospital. MAKE SURE YOU:   Understand these instructions.  Will watch your condition.  Will get help right away if you are not doing well or get worse. Document Released: 09/09/2005 Document Revised: 12/05/2013 Document Reviewed: 07/05/2008 [facility] Patient Information 2015 [place], [place]. This information is not intended to replace advice given to you by your health care provider. Make sure you discuss any questions you have with your health care provider.

## 2014-07-31 NOTE — ED Provider Notes (Signed)
CSN: [id number]     Arrival date & time 07/30/14  2102 History   First MD Initiated Contact with Patient 07/31/14 0107     Chief Complaint  Patient presents with  . Chest Pain  . Shortness of Breath     (Consider location/radiation/quality/duration/timing/severity/associated sxs/prior Treatment) HPI Comments: Patient presents with left-sided chest pain that has been constant since yesterday morning. It is described as sharp tightness. It is worse with movement of his arms. Denies any falls or trauma. Nothing makes the pain better. The pain is constant pain does not radiate. Endorses some shortness of breath and nausea. Denies any fevers, chills or cough. No cardiac history. He has never had a stress test. No stents in his heart. Denies any recent drug abuse. History of hypertension and seizure disorder. Denies any leg pain or leg swelling. No abdominal pain, vomiting.  The history is provided by the patient.    Past Medical History  Diagnosis Date  . Hypertension   . Seizures     last episode 03/2013  . Asthma   . Brain bleed    Past Surgical History  Procedure Laterality Date  . Leg surgery     Family History  Problem Relation Age of Onset  . Diabetes Mellitus II Sister   . Cancer Father    History  Substance Use Topics  . Smoking status: Current Every Day Smoker -- 1.00 packs/day for 26 years    Types: Cigarettes  . Smokeless tobacco: Never Used  . Alcohol Use: 2.4 oz/week    4 Cans of beer per week     Comment: drinks heavily. "pt sts he only drinks one beer a day now"    Review of Systems  Constitutional: Negative for fever, activity change and appetite change.  HENT: Negative for congestion and rhinorrhea.   Respiratory: Positive for chest tightness and shortness of breath. Negative for cough.   Cardiovascular: Positive for chest pain.  Gastrointestinal: Negative for nausea, vomiting and abdominal pain.  Genitourinary: Negative for dysuria and hematuria.   Musculoskeletal: Positive for back pain. Negative for arthralgias and myalgias.  Skin: Negative for rash.  Neurological: Negative for dizziness, weakness and headaches.  A complete 10 system review of systems was obtained and all systems are negative except as noted in the HPI and PMH.      Allergies  Dilaudid  Home Medications   Prior to Admission medications   Medication Sig Start Date End Date Taking? Authorizing Provider  amLODipine (NORVASC) 10 MG tablet Take 1 tablet (10 mg total) by mouth daily. 03/20/14  Yes Charlynne Cousins, MD  aspirin 325 MG buffered tablet Take 1 tablet (325 mg total) by mouth daily. 12/25/13  Yes Thurnell Lose, MD  atenolol (TENORMIN) 50 MG tablet Take 1 tablet (50 mg total) by mouth daily. 03/20/14  Yes Charlynne Cousins, MD  hydrALAZINE (APRESOLINE) 50 MG tablet Take 1 tablet (50 mg total) by mouth every 8 (eight) hours. 03/20/14  Yes Charlynne Cousins, MD  hydrochlorothiazide (HYDRODIURIL) 12.5 MG tablet Take 1 tablet (12.5 mg total) by mouth daily. 03/20/14  Yes Charlynne Cousins, MD  phenytoin (DILANTIN INFATABS) 50 MG tablet Chew 100 mg by mouth 2 (two) times daily.   Yes Historical Provider, MD   BP 140/94  Pulse 73  Temp(Src) 97.9 F (36.6 C) (Oral)  Resp 23  Ht 5\' 9"  (1.753 m)  Wt 195 lb (88.451 kg)  BMI 28.78 kg/m2  SpO2 95% Physical Exam  Nursing  note and vitals reviewed. Constitutional: He is oriented to person, place, and time. He appears well-developed and well-nourished. No distress.  HENT:  Head: Normocephalic and atraumatic.  Mouth/Throat: Oropharynx is clear and moist. No oropharyngeal exudate.  Eyes: Conjunctivae and EOM are normal. Pupils are equal, round, and reactive to light.  Neck: Normal range of motion. Neck supple.  No meningismus.  Cardiovascular: Normal rate, regular rhythm, normal heart sounds and intact distal pulses.   No murmur heard. Pulmonary/Chest: Effort normal and breath sounds normal. No respiratory  distress. He exhibits tenderness.  TTP L chest  Abdominal: Soft. There is no tenderness. There is no rebound and no guarding.  Musculoskeletal: Normal range of motion. He exhibits no edema and no tenderness.  Neurological: He is alert and oriented to person, place, and time. No cranial nerve deficit. He exhibits normal muscle tone. Coordination normal.  No ataxia on finger to nose bilaterally. No pronator drift. 5/5 strength throughout. CN 2-12 intact. Negative Romberg. Equal grip strength. Sensation intact. Gait is normal.   Skin: Skin is warm.  Psychiatric: He has a normal mood and affect. His behavior is normal.    ED Course  Procedures (including critical care time) Labs Review Labs Reviewed  CBC - Abnormal; Notable for the following:    Hemoglobin 17.8 (*)    All other components within normal limits  BASIC METABOLIC PANEL - Abnormal; Notable for the following:    Glucose, Bld 100 (*)    GFR calc non Af Amer 88 (*)    All other components within normal limits  TROPONIN I  D-DIMER, QUANTITATIVE  URINE RAPID DRUG SCREEN (HOSP PERFORMED)  I-STAT TROPOININ, ED    Imaging Review Dg Chest 2 View  07/31/2014   CLINICAL DATA:  Chest pain, shortness of breath  EXAM: CHEST  2 VIEW  COMPARISON:  Prior radiograph from 06/20/2014  FINDINGS: The cardiac and mediastinal silhouettes are stable in size and contour, and remain within normal limits.  The lungs are normally inflated. No airspace consolidation, pleural effusion, or pulmonary edema is identified. There is no pneumothorax.  No acute osseous abnormality identified.  IMPRESSION: No active cardiopulmonary disease.   Electronically Signed   By: Jeannine Boga M.D.   On: 07/31/2014 01:49     EKG Interpretation   Date/Time:  Monday July 30 2014 21:09:57 EDT Ventricular Rate:  72 PR Interval:  154 QRS Duration: 90 QT Interval:  380 QTC Calculation: 416 R Axis:   25 Text Interpretation:  Normal sinus rhythm Possible Left  atrial enlargement  Left ventricular hypertrophy Early repolarization Abnormal ECG Confirmed  by Christy Gentles  MD, Elenore Rota ([id number]) on 07/30/2014 9:20:37 PM      MDM   Final diagnoses:  Atypical chest pain  Essential hypertension   Patient reports left-sided chest pain that has been constant since yesterday morning it feels like a tightness. It does not radiate. He endorses some shortness of breath. No diaphoresis or nausea. Denies recent cocaine use.  EKG normal sinus rhythm with early repolarization.  Chest is tender to palpation which reproduces patient's pain.  Troponin negative. D-dimer negative.  Heart score is 1. Patient's pain is reproducible and constant since yesterday. Atypical for ACS. Troponin negative x2.  Blood pressure improved to 778 systolic. Patient advised the need for stress test as an outpatient. Return precautions discussed.  BP 140/94  Pulse 73  Temp(Src) 97.9 F (36.6 C) (Oral)  Resp 23  Ht 5\' 9"  (1.753 m)  Wt 195 lb (88.451 kg)  BMI 28.78 kg/m2  SpO2 95%    Ezequiel Essex, MD 07/31/14 [phone]

## 2014-08-15 ENCOUNTER — Encounter (HOSPITAL_COMMUNITY): Payer: Self-pay | Admitting: Emergency Medicine

## 2014-08-15 ENCOUNTER — Emergency Department (HOSPITAL_COMMUNITY)
Admission: EM | Admit: 2014-08-15 | Discharge: 2014-08-15 | Discharge status: Against Medical Advice | Payer: PRIVATE HEALTH INSURANCE | Attending: Emergency Medicine | Admitting: Emergency Medicine

## 2014-08-15 ENCOUNTER — Emergency Department (HOSPITAL_COMMUNITY): Payer: PRIVATE HEALTH INSURANCE

## 2014-08-15 DIAGNOSIS — J45909 Unspecified asthma, uncomplicated: Secondary | ICD-10-CM | POA: Diagnosis not present

## 2014-08-15 DIAGNOSIS — Z79899 Other long term (current) drug therapy: Secondary | ICD-10-CM | POA: Diagnosis not present

## 2014-08-15 DIAGNOSIS — G40909 Epilepsy, unspecified, not intractable, without status epilepticus: Secondary | ICD-10-CM | POA: Insufficient documentation

## 2014-08-15 DIAGNOSIS — F17200 Nicotine dependence, unspecified, uncomplicated: Secondary | ICD-10-CM | POA: Diagnosis not present

## 2014-08-15 DIAGNOSIS — I10 Essential (primary) hypertension: Secondary | ICD-10-CM | POA: Diagnosis not present

## 2014-08-15 DIAGNOSIS — R51 Headache: Secondary | ICD-10-CM | POA: Insufficient documentation

## 2014-08-15 DIAGNOSIS — M6281 Muscle weakness (generalized): Secondary | ICD-10-CM | POA: Insufficient documentation

## 2014-08-15 DIAGNOSIS — F1410 Cocaine abuse, uncomplicated: Secondary | ICD-10-CM | POA: Diagnosis not present

## 2014-08-15 DIAGNOSIS — R079 Chest pain, unspecified: Secondary | ICD-10-CM | POA: Diagnosis present

## 2014-08-15 DIAGNOSIS — R29898 Other symptoms and signs involving the musculoskeletal system: Secondary | ICD-10-CM

## 2014-08-15 LAB — BASIC METABOLIC PANEL
Anion gap: 10 (ref 5–15)
BUN: 10 mg/dL (ref 6–23)
CHLORIDE: 110 meq/L (ref 96–112)
CO2: 23 mEq/L (ref 19–32)
CREATININE: 1.14 mg/dL (ref 0.50–1.35)
Calcium: 8.6 mg/dL (ref 8.4–10.5)
GFR calc Af Amer: 90 mL/min — ABNORMAL LOW (ref >90)
GFR calc non Af Amer: 77 mL/min — ABNORMAL LOW (ref >90)
GLUCOSE: 96 mg/dL (ref 70–99)
POTASSIUM: 4.4 meq/L (ref 3.7–5.3)
Sodium: 143 mEq/L (ref 137–147)

## 2014-08-15 LAB — RAPID URINE DRUG SCREEN, HOSP PERFORMED
AMPHETAMINES: NOT DETECTED
BENZODIAZEPINES: NOT DETECTED
Barbiturates: NOT DETECTED
Cocaine: POSITIVE — AB
Opiates: NOT DETECTED
Tetrahydrocannabinol: NOT DETECTED

## 2014-08-15 LAB — CBC WITH DIFFERENTIAL/PLATELET
Basophils Absolute: 0 10*3/uL (ref 0.0–0.1)
Basophils Relative: 0 % (ref 0–1)
Eosinophils Absolute: 0 10*3/uL (ref 0.0–0.7)
Eosinophils Relative: 0 % (ref 0–5)
HEMATOCRIT: 45.7 % (ref 39.0–52.0)
HEMOGLOBIN: 15.4 g/dL (ref 13.0–17.0)
LYMPHS ABS: 1.7 10*3/uL (ref 0.7–4.0)
Lymphocytes Relative: 24 % (ref 12–46)
MCH: 32 pg (ref 26.0–34.0)
MCHC: 33.7 g/dL (ref 30.0–36.0)
MCV: 95 fL (ref 78.0–100.0)
MONO ABS: 0.6 10*3/uL (ref 0.1–1.0)
Monocytes Relative: 9 % (ref 3–12)
NEUTROS PCT: 67 % (ref 43–77)
Neutro Abs: 4.8 10*3/uL (ref 1.7–7.7)
Platelets: 249 10*3/uL (ref 150–400)
RBC: 4.81 MIL/uL (ref 4.22–5.81)
RDW: 14.2 % (ref 11.5–15.5)
WBC: 7.2 10*3/uL (ref 4.0–10.5)

## 2014-08-15 LAB — PRO B NATRIURETIC PEPTIDE: PRO B NATRI PEPTIDE: 36 pg/mL (ref 0–125)

## 2014-08-15 LAB — I-STAT TROPONIN, ED: Troponin i, poc: 0 ng/mL (ref 0.00–0.08)

## 2014-08-15 MED ORDER — NITROGLYCERIN 0.4 MG SL SUBL: 0.4000 mg | SUBLINGUAL_TABLET | SUBLINGUAL | Status: DC | PRN

## 2014-08-15 NOTE — ED Notes (Signed)
Pt states that his RIGHT leg is the leg having weakness, NOT his LEFT leg, as documented.

## 2014-08-15 NOTE — ED Notes (Signed)
Pt reports to the ED for eval of HA, right sided weakness, and HTN x 3 days via [facility]. Pt initially called out for that and upon EMS arrival pt also complained of midsternal, sharp CPs at a 10/10. Pain does not radiate. Reports associated symptoms of SOB and diaphoresis. En route pt had some elevation in V3, V4, and V5 EDP obtained copy of EKG and it looks similar to the previous EKG. Pt received 2 nitros and 324 of ASA en route and his CP went from a 10/10 to a 1/10. Pt still reports some right sided weakness and sensation differences. Pt still hypertensive on arrival. Pt A&Ox4, resp e/u, and skin warm and dry.

## 2014-08-15 NOTE — Discharge Instructions (Signed)

## 2014-08-15 NOTE — ED Notes (Signed)
RN and EDP informed pt of risks and benefits of leaving AMA. Pt reports he understands the risks and benefits and reports he still wants to leave AMA. Pt A&Ox4 and able to make informed health decisions. Pt in NAD but he is hypertensive. Pt told to take his home BP meds.

## 2014-08-15 NOTE — ED Provider Notes (Signed)
CSN: [id number]     Arrival date & time 08/15/14  1241 History   First MD Initiated Contact with Patient 08/15/14 1242     Chief Complaint  Patient presents with  . Chest Pain     (Consider location/radiation/quality/duration/timing/severity/associated sxs/prior Treatment) HPI Comments: Patient reports sudden onset headache elevated blood pressure and mid sternal sharp chest pain around 11 AM this morning while at rest. No radiation. He also reports about 3 days of left leg weakness. He denies any acute injuries. He denies fevers, chills, shortness of breath, nausea, vomiting or diaphoresis. Patient was given 2 sublingual nitroglycerin) and 325 mg of aspirin by EMS in route and his chest pain improved from 10 out of 10-1/10  The history is provided by the patient. No language interpreter was used.    Past Medical History  Diagnosis Date  . Hypertension   . Seizures     last episode 03/2013  . Asthma   . Brain bleed    Past Surgical History  Procedure Laterality Date  . Leg surgery     Family History  Problem Relation Age of Onset  . Diabetes Mellitus II Sister   . Cancer Father    History  Substance Use Topics  . Smoking status: Current Every Day Smoker -- 1.00 packs/day for 26 years    Types: Cigarettes  . Smokeless tobacco: Never Used  . Alcohol Use: 2.4 oz/week    4 Cans of beer per week     Comment: drinks heavily. "pt sts he only drinks one beer a day now"    Review of Systems  Constitutional: Negative for fever, activity change, appetite change and fatigue.  HENT: Negative for congestion, facial swelling, rhinorrhea and trouble swallowing.   Eyes: Negative for photophobia and pain.  Respiratory: Negative for cough, chest tightness and shortness of breath.   Cardiovascular: Positive for chest pain. Negative for leg swelling.  Gastrointestinal: Negative for nausea, vomiting, abdominal pain, diarrhea and constipation.  Endocrine: Negative for polydipsia and polyuria.   Genitourinary: Negative for dysuria, urgency, decreased urine volume and difficulty urinating.  Musculoskeletal: Negative for back pain and gait problem.  Skin: Negative for color change, rash and wound.  Allergic/Immunologic: Negative for immunocompromised state.  Neurological: Positive for weakness and headaches. Negative for dizziness, facial asymmetry, speech difficulty and numbness.  Psychiatric/Behavioral: Negative for confusion, decreased concentration and agitation.      Allergies  Dilaudid  Home Medications   Prior to Admission medications   Medication Sig Start Date End Date Taking? Authorizing Provider  amLODipine (NORVASC) 10 MG tablet Take 1 tablet (10 mg total) by mouth daily. 03/20/14  Yes Charlynne Cousins, MD  atenolol (TENORMIN) 50 MG tablet Take 1 tablet (50 mg total) by mouth daily. 03/20/14  Yes Charlynne Cousins, MD  hydrALAZINE (APRESOLINE) 50 MG tablet Take 1 tablet (50 mg total) by mouth every 8 (eight) hours. 03/20/14  Yes Charlynne Cousins, MD  hydrochlorothiazide (HYDRODIURIL) 12.5 MG tablet Take 1 tablet (12.5 mg total) by mouth daily. 03/20/14  Yes Charlynne Cousins, MD  phenytoin (DILANTIN) 50 MG tablet Chew 100 mg by mouth 2 (two) times daily.   Yes Historical Provider, MD   BP 169/109  Pulse 67  Temp(Src) 97.5 F (36.4 C) (Oral)  Resp 19  SpO2 92% Physical Exam  Constitutional: He is oriented to person, place, and time. He appears well-developed and well-nourished. No distress.  HENT:  Head: Normocephalic and atraumatic.  Mouth/Throat: No oropharyngeal exudate.  Eyes:  Pupils are equal, round, and reactive to light.  Neck: Normal range of motion. Neck supple.  Cardiovascular: Normal rate, regular rhythm and normal heart sounds.  Exam reveals no gallop and no friction rub.   No murmur heard. Pulmonary/Chest: Effort normal and breath sounds normal. No respiratory distress. He has no wheezes. He has no rales.  Abdominal: Soft. Bowel sounds are  normal. He exhibits no distension and no mass. There is no tenderness. There is no rebound and no guarding.  Musculoskeletal: Normal range of motion. He exhibits no edema and no tenderness.  Neurological: He is alert and oriented to person, place, and time. A sensory deficit is present. No cranial nerve deficit. He exhibits abnormal muscle tone. GCS eye subscore is 4. GCS verbal subscore is 5. GCS motor subscore is 6.  4/5 strength RLE, subjective dec fine touch to RUE and RLE  Skin: Skin is warm and dry.  Psychiatric: He has a normal mood and affect.    ED Course  Procedures (including critical care time) Labs Review Labs Reviewed  BASIC METABOLIC PANEL - Abnormal; Notable for the following:    GFR calc non Af Amer 77 (*)    GFR calc Af Amer 90 (*)    All other components within normal limits  URINE RAPID DRUG SCREEN (HOSP PERFORMED) - Abnormal; Notable for the following:    Cocaine POSITIVE (*)    All other components within normal limits  CBC WITH DIFFERENTIAL  PRO B NATRIURETIC PEPTIDE  I-STAT TROPOININ, ED    Imaging Review Dg Chest 2 View  08/15/2014   CLINICAL DATA:  Chest pain, shortness of breath and dizziness.  EXAM: CHEST  2 VIEW  COMPARISON:  PA and lateral chest 07/31/2014 and 06/18/2013.  FINDINGS: The lungs are clear. Heart size is normal. No pneumothorax or pleural effusion. No focal bony abnormality.  IMPRESSION: Negative chest.   Electronically Signed   By: Inge Rise M.D.   On: 08/15/2014 13:46   Ct Head Wo Contrast  08/15/2014   ADDENDUM REPORT: 08/15/2014 13:37  ADDENDUM: This addendum is given for the purpose of correcting the initially dictated Clinical Data section. The patient's headache is on the right and not the left.   Electronically Signed   By: Inge Rise M.D.   On: 08/15/2014 13:37   08/15/2014   CLINICAL DATA:  Left-sided headache. Left leg weakness. Symptoms for 3 days.  EXAM: CT HEAD WITHOUT CONTRAST  TECHNIQUE: Contiguous axial images were  obtained from the base of the skull through the vertex without intravenous contrast.  COMPARISON:  Head CT scan 06/20/2014 and 04/30/2014. Brain MRI 12/25/2013.  FINDINGS: Remote left frontal infarct and chronic microvascular ischemic change are again seen. No evidence of acute infarction, hemorrhage, mass lesion, mass effect, midline shift or abnormal extra-axial fluid collection is identified. Chronic opacification of left mastoid sinus with wall thickening is noted. The patient is status post left maxillary antrostomy. The calvarium is intact.  IMPRESSION: No acute finding.  Remote left frontal infarct and chronic microvascular ischemic change, stable compared to prior exams.  Chronic left maxillary sinus disease.  Electronically Signed: By: Inge Rise M.D. On: 08/15/2014 13:30     EKG Interpretation   Date/Time:  Wednesday August 15 2014 12:46:18 EDT Ventricular Rate:  68 PR Interval:  162 QRS Duration: 86 QT Interval:  410 QTC Calculation: 436 R Axis:   52 Text Interpretation:  Sinus rhythm Probable left atrial enlargement  Probable left ventricular hypertrophy Early repolarization No  significant  change since last tracing Confirmed by DOCHERTY  MD, MEGAN [phone]) on  08/15/2014 12:54:29 PM      MDM   Final diagnoses:  Chest pain at rest  Left leg weakness    Pt is a 43 y.o. male with Pmhx as above who presents with chest pain since about 11 AM. Pain is described as sharp/the needles. Pain was 10/10 and is now 1/10 after 2 sublingual nitroglycerin and 325 mg of ASA by EMS in route. Patient states that the chest pain was preceded by headache and raise in blood pressure. He also states he has had 3 days of right leg weakness and difficulty ambulating on physical exam vital signs are stable patient is in no acute distress. EKG is consistent with priors with early repolarization changes. He has 4/5 strength in the left lower extremity as well as subjective decreased fine touch  entirety of right leg and right arm.   Patient is chest pain-free after additional sublingual nitroglycerin. He First troponin is negative. Chest x-ray is unremarkable. UDS is positive for cocaine. Patient states he hasn't been doing any cocaine recently and can't remember the last time he used cocaine. While speaking to patient I noticed a pain in the room. He states he has been using a cane intermittently for his left leg weakness for about one year. It does not appear that this is like symptom is acute. I discussed Dr. troponin to be drawn in about one hour. Patient has declined this and would instead prefer to leave AMA. He understands I cannot rule out emergent medical condition such as acute coronary syndrome which could lead to pain or death. He agrees to return should his chest pain recur. I have given him a referral to [facility] and [place], MD 08/15/14 [phone]

## 2014-12-01 ENCOUNTER — Emergency Department (HOSPITAL_COMMUNITY): Payer: PRIVATE HEALTH INSURANCE

## 2014-12-01 ENCOUNTER — Encounter (HOSPITAL_COMMUNITY): Payer: Self-pay | Admitting: Adult Health

## 2014-12-01 ENCOUNTER — Emergency Department (HOSPITAL_COMMUNITY)
Admission: EM | Admit: 2014-12-01 | Discharge: 2014-12-01 | Disposition: A | Discharge status: Home / Self Care | Payer: PRIVATE HEALTH INSURANCE | Attending: Emergency Medicine | Admitting: Emergency Medicine

## 2014-12-01 DIAGNOSIS — R55 Syncope and collapse: Secondary | ICD-10-CM | POA: Diagnosis not present

## 2014-12-01 DIAGNOSIS — R109 Unspecified abdominal pain: Secondary | ICD-10-CM | POA: Insufficient documentation

## 2014-12-01 DIAGNOSIS — J45909 Unspecified asthma, uncomplicated: Secondary | ICD-10-CM | POA: Diagnosis not present

## 2014-12-01 DIAGNOSIS — Z79899 Other long term (current) drug therapy: Secondary | ICD-10-CM | POA: Insufficient documentation

## 2014-12-01 DIAGNOSIS — Z720 Tobacco use: Secondary | ICD-10-CM | POA: Insufficient documentation

## 2014-12-01 DIAGNOSIS — R042 Hemoptysis: Secondary | ICD-10-CM | POA: Diagnosis not present

## 2014-12-01 DIAGNOSIS — G40909 Epilepsy, unspecified, not intractable, without status epilepticus: Secondary | ICD-10-CM | POA: Diagnosis not present

## 2014-12-01 DIAGNOSIS — K088 Other specified disorders of teeth and supporting structures: Secondary | ICD-10-CM | POA: Insufficient documentation

## 2014-12-01 DIAGNOSIS — R51 Headache: Secondary | ICD-10-CM | POA: Diagnosis not present

## 2014-12-01 DIAGNOSIS — I10 Essential (primary) hypertension: Secondary | ICD-10-CM | POA: Insufficient documentation

## 2014-12-01 DIAGNOSIS — M79604 Pain in right leg: Secondary | ICD-10-CM | POA: Diagnosis present

## 2014-12-01 LAB — CBC WITH DIFFERENTIAL/PLATELET
Basophils Absolute: 0 10*3/uL (ref 0.0–0.1)
Basophils Relative: 0 % (ref 0–1)
EOS ABS: 0 10*3/uL (ref 0.0–0.7)
EOS PCT: 0 % (ref 0–5)
HEMATOCRIT: 47.3 % (ref 39.0–52.0)
HEMOGLOBIN: 16.6 g/dL (ref 13.0–17.0)
Lymphocytes Relative: 28 % (ref 12–46)
Lymphs Abs: 2.6 10*3/uL (ref 0.7–4.0)
MCH: 32 pg (ref 26.0–34.0)
MCHC: 35.1 g/dL (ref 30.0–36.0)
MCV: 91.3 fL (ref 78.0–100.0)
MONO ABS: 0.9 10*3/uL (ref 0.1–1.0)
MONOS PCT: 10 % (ref 3–12)
NEUTROS ABS: 5.9 10*3/uL (ref 1.7–7.7)
Neutrophils Relative %: 62 % (ref 43–77)
Platelets: 273 10*3/uL (ref 150–400)
RBC: 5.18 MIL/uL (ref 4.22–5.81)
RDW: 13.7 % (ref 11.5–15.5)
WBC: 9.4 10*3/uL (ref 4.0–10.5)

## 2014-12-01 LAB — COMPREHENSIVE METABOLIC PANEL
ALT: 23 U/L (ref 0–53)
ANION GAP: 15 (ref 5–15)
AST: 18 U/L (ref 0–37)
Albumin: 3.7 g/dL (ref 3.5–5.2)
Alkaline Phosphatase: 96 U/L (ref 39–117)
BILIRUBIN TOTAL: 0.3 mg/dL (ref 0.3–1.2)
BUN: 4 mg/dL — AB (ref 6–23)
CALCIUM: 9.2 mg/dL (ref 8.4–10.5)
CHLORIDE: 107 meq/L (ref 96–112)
CO2: 20 mEq/L (ref 19–32)
Creatinine, Ser: 0.85 mg/dL (ref 0.50–1.35)
GFR calc Af Amer: >90 mL/min (ref >90)
GFR calc non Af Amer: >90 mL/min (ref >90)
Glucose, Bld: 103 mg/dL — ABNORMAL HIGH (ref 70–99)
Potassium: 4 mEq/L (ref 3.7–5.3)
Sodium: 142 mEq/L (ref 137–147)
Total Protein: 7.7 g/dL (ref 6.0–8.3)

## 2014-12-01 LAB — I-STAT TROPONIN, ED: TROPONIN I, POC: 0 ng/mL (ref 0.00–0.08)

## 2014-12-01 LAB — LIPASE, BLOOD: Lipase: 87 U/L — ABNORMAL HIGH (ref 11–59)

## 2014-12-01 MED ORDER — ACETAMINOPHEN 325 MG PO TABS
650.0000 mg | ORAL_TABLET | Freq: Once | ORAL | Status: DC
  Filled 2014-12-01: qty 2

## 2014-12-01 MED ORDER — IOHEXOL 350 MG/ML SOLN
100.0000 mL | Freq: Once | INTRAVENOUS | Status: AC | PRN
  Administered 2014-12-01: 100 mL via INTRAVENOUS

## 2014-12-01 MED ORDER — SODIUM CHLORIDE 0.9 % IV BOLUS (SEPSIS)
1000.0000 mL | Freq: Once | INTRAVENOUS | Status: AC
  Administered 2014-12-01: 1000 mL via INTRAVENOUS

## 2014-12-01 NOTE — ED Notes (Signed)
Pt ambulated in room without assistance.  Pt denies and pain at this time.    PA made aware of diastolic BP, okay'd pt to head home.

## 2014-12-01 NOTE — ED Provider Notes (Signed)
CSN: [id number]     Arrival date & time 12/01/14  1737 History   First MD Initiated Contact with Patient 12/01/14 1746     Chief Complaint  Patient presents with  . Leg Pain  . Dental Pain  . Hematemesis  . Abdominal Pain  . Headache     (Consider location/radiation/quality/duration/timing/severity/associated sxs/prior Treatment) HPI Richard [name] is a 43 y.o. male with a history of seizures, hypertension, asthma and remote intracranial hemorrhage comes in for evaluation of multiple medical complaints. Patient is a very poor historian, but reports this morning he woke up and tasted blood in his mouth, spit in the toilet bowl and noticed that it was very red. He reports intermittently spitting out blood throughout the day. He also reports dental pain to his upper left premolar for the past 3 days. He reports making an appointment with his dentist on Monday. He also complains of syncopal episodes multiple times throughout the day. He reports earlier this afternoon laying down on the couch and his wife brought him some water and says that the patient was unresponsive for 7 or 8 minutes. She called EMS and he returned to consciousness when they did a needle stick to his finger. She denies any seizure activity. She also reports that he has poor medication compliance and does not take his medication as prescribed. Denies chest pain, shortness of breath, leg swelling, abdominal pain, diarrhea or constipation Also complains of chronic headache pain on left side of his head as well as right leg pain diffusely.  Past Medical History  Diagnosis Date  . Hypertension   . Seizures     last episode 03/2013  . Asthma   . Brain bleed    Past Surgical History  Procedure Laterality Date  . Leg surgery     Family History  Problem Relation Age of Onset  . Diabetes Mellitus II Sister   . Cancer Father    History  Substance Use Topics  . Smoking status: Current Every Day Smoker -- 1.00 packs/day for 26  years    Types: Cigarettes  . Smokeless tobacco: Never Used  . Alcohol Use: 2.4 oz/week    4 Cans of beer per week     Comment: drinks heavily. "pt sts he only drinks one beer a day now"    Review of Systems  Constitutional: Negative for fever.  HENT: Positive for dental problem and nosebleeds.   Respiratory: Negative for cough and shortness of breath.   Cardiovascular: Negative for chest pain.  Gastrointestinal: Negative for abdominal pain, diarrhea, constipation and blood in stool.  Endocrine: Negative for polyuria.  Genitourinary: Negative for dysuria.  Musculoskeletal: Positive for myalgias and arthralgias.  Skin: Negative for rash.  Neurological: Positive for syncope and headaches.  Psychiatric/Behavioral: Negative for confusion.  All other systems reviewed and are negative.     Allergies  Dilaudid  Home Medications   Prior to Admission medications   Medication Sig Start Date End Date Taking? Authorizing Provider  atenolol (TENORMIN) 50 MG tablet Take 1 tablet (50 mg total) by mouth daily. 03/20/14  Yes Charlynne Cousins, MD  hydrALAZINE (APRESOLINE) 50 MG tablet Take 1 tablet (50 mg total) by mouth every 8 (eight) hours. 03/20/14  Yes Charlynne Cousins, MD  phenytoin (DILANTIN) 100 MG ER capsule Take 300 mg by mouth 3 (three) times daily.  11/23/14  Yes Historical Provider, MD  amLODipine (NORVASC) 10 MG tablet Take 1 tablet (10 mg total) by mouth daily. 03/20/14  Charlynne Cousins, MD  cloNIDine (CATAPRES) 0.1 MG tablet Take 0.1 mg by mouth 2 (two) times daily. 11/23/14   Historical Provider, MD  hydrochlorothiazide (HYDRODIURIL) 12.5 MG tablet Take 1 tablet (12.5 mg total) by mouth daily. 03/20/14   Charlynne Cousins, MD  phenytoin (DILANTIN) 50 MG tablet Chew 100 mg by mouth 2 (two) times daily.    Historical Provider, MD   BP 153/111 mmHg  Pulse 73  Temp(Src) 98.3 F (36.8 C) (Oral)  Resp 22  SpO2 89% Physical Exam  Constitutional: He is oriented to person,  place, and time. He appears well-developed and well-nourished.  HENT:  Head: Normocephalic and atraumatic.  Mouth/Throat: Oropharynx is clear and moist.  Very poor dentition with multiple missing teeth, multiple caries and ulcerations. Will require dentistry. Pain to tooth #12. No evidence of drainable abscess. No trismus, no glossal elevation. No unilateral tonsillar swelling. Tolerating secretions well. Patent airway  Eyes: Conjunctivae and EOM are normal. Pupils are equal, round, and reactive to light. Right eye exhibits no discharge. Left eye exhibits no discharge. No scleral icterus.  Neck: Normal range of motion. Neck supple. No JVD present. No tracheal deviation present.  Cardiovascular: Normal rate, regular rhythm, normal heart sounds and intact distal pulses.  Exam reveals no gallop and no friction rub.   No murmur heard. Pulmonary/Chest: Effort normal and breath sounds normal. No respiratory distress. He has no wheezes. He has no rales.  Abdominal: Soft. He exhibits no distension and no mass. There is no tenderness. There is no rebound and no guarding.  Musculoskeletal: Normal range of motion. He exhibits no edema or tenderness.  Neurological: He is alert and oriented to person, place, and time.  Cranial Nerves II-XII grossly intact. Motor and Sensation 5/5 in all extremities. Gait baseline with no ataxia. AAO x 4  Skin: Skin is warm and dry. No rash noted.  Psychiatric: He has a normal mood and affect.  Nursing note and vitals reviewed.   ED Course  Procedures (including critical care time) Labs Review Labs Reviewed  COMPREHENSIVE METABOLIC PANEL - Abnormal; Notable for the following:    Glucose, Bld 103 (*)    BUN 4 (*)    All other components within normal limits  LIPASE, BLOOD - Abnormal; Notable for the following:    Lipase 87 (*)    All other components within normal limits  CBC WITH DIFFERENTIAL  I-STAT TROPOININ, ED    Imaging Review Ct Angio Chest Pe W/cm &/or Wo  Cm  12/01/2014   CLINICAL DATA:  Hemoptysis, syncope, leg pain  EXAM: CT ANGIOGRAPHY CHEST WITH CONTRAST  TECHNIQUE: Multidetector CT imaging of the chest was performed using the standard protocol during bolus administration of intravenous contrast. Multiplanar CT image reconstructions and MIPs were obtained to evaluate the vascular anatomy.  CONTRAST:  17mL OMNIPAQUE IOHEXOL 350 MG/ML SOLN  COMPARISON:  Chest radiographs dated 08/15/2014  FINDINGS: No evidence of pulmonary embolism.  Evaluation of the lung parenchyma is constrained by respiratory motion.  Mild patchy opacity in the posterior right upper lobe, likely atelectasis. No focal consolidation. No pleural effusion or pneumothorax.  Visualized thyroid is unremarkable.  Heart is normal in size.  No pericardial effusion.  Small mediastinal lymph nodes, likely reactive, including:  --11 mm short axis right paratracheal node (series 4/image 26)  --10 mm short axis AP window node (series 4/ image 27)  --11 mm short axis subcarinal node (series 4/image 39)  --9 mm short axis right infrahilar node (series  4/image 49)  Visualized upper abdomen is notable for a 2.9 x 1.7 cm left adrenal adenoma. Mildly nodular hepatic contour, nonspecific.  Mild degenerative changes of the visualized thoracolumbar spine.  Review of the MIP images confirms the above findings.  IMPRESSION: No evidence of pulmonary embolism.  No evidence of acute cardiopulmonary disease.   Electronically Signed   By: Julian Hy M.D.   On: 12/01/2014 21:49     EKG Interpretation   Date/Time:  Saturday December 01 2014 18:46:44 EST Ventricular Rate:  67 PR Interval:  166 QRS Duration: 105 QT Interval:  424 QTC Calculation: 448 R Axis:   62 Text Interpretation:  Age not entered, assumed to be  43 years old for  purpose of ECG interpretation Sinus rhythm Biatrial enlargement Left  ventricular hypertrophy ST elev, probable normal early repol pattern  Baseline wander in lead(s) V5 No  significant change since last tracing  Confirmed by HARRISON  MD, FORREST ([id number]) on 12/01/2014 7:17:21 PM     Meds given in ED:  Medications  sodium chloride 0.9 % bolus 1,000 mL (0 mLs Intravenous Stopped 12/01/14 2225)  iohexol (OMNIPAQUE) 350 MG/ML injection 100 mL (100 mLs Intravenous Contrast Given 12/01/14 2132)    Discharge Medication List as of 12/01/2014 10:00 PM     Filed Vitals:   12/01/14 1945 12/01/14 2000 12/01/14 2115 12/01/14 2215  BP: 134/86 131/87 152/100 153/111  Pulse: 65 75 65 73  Temp:      TempSrc:      Resp: 17 17 17 22   SpO2: 96% 96% 93% 89%    MDM  Vitals stable - WNL -afebrile Pt resting comfortably in ED. reports he feels fine and wants to go home. Reports he is asymptomatic at this time. No evidence of bleeding in ED. PE--not concerning for other acute or emergent pathology, although patient refused rectal exam. Benign abdominal exam. Normal neuro exam. Gait baseline w/o ataxia. Labwork noncontributory, EKG not concerning, Troponin negative Imaging--CT angiogram shows no evidence of pulmonary embolism or other cardiopulmonary pathology.  Patient requested to go home, I feel that there is no emergent condition that would necessitate him staying. Discussed f/u with PCP and return precautions, pt very amenable to plan. Patient stable, in good condition and is appropriate for discharge  Prior to patient discharge, I discussed and reviewed this case with Dr.Harrison, who also saw and evaluated pt.    Final diagnoses:  Syncope  Hemoptysis        Verl Dicker, PA-C 12/02/14 [place], MD 12/02/14 [phone]

## 2014-12-01 NOTE — Discharge Instructions (Signed)
You were evaluated in the ED today for your episode of passing out and coughing up blood. There does not appear to be any emergent causes for your symptoms at this time. Please follow-up with primary care for further evaluation and management of your symptoms. Return to ED for worsening symptoms

## 2014-12-01 NOTE — ED Notes (Signed)
Able to get pt to agree to IV start and bolus.  Does not want Tylenol at this time.

## 2014-12-01 NOTE — ED Notes (Signed)
Presents with "leg pain and vomiting blood, headache, and dental pain" began 3 days ago, denies blood in stool or dark stools. Pain is chronic in right leg. Headache pain is left side of head and has affected his sleep. Last epiusode of throwing up blood was today.

## 2014-12-01 NOTE — ED Notes (Signed)
This RN went in due to pt oxygen sats reading 88% on RA, pt currently resting, easily arousable

## 2014-12-01 NOTE — ED Notes (Signed)
EDP at bedside with RN

## 2014-12-01 NOTE — ED Notes (Signed)
Pt is stating he does want any interventions at this time.  Pt continues to state "I want to go home".  PA notified.

## 2014-12-01 NOTE — ED Notes (Signed)
Pt sts "I don't want to be here.  I just want to go home".

## 2014-12-15 ENCOUNTER — Emergency Department (HOSPITAL_COMMUNITY)
Admission: EM | Admit: 2014-12-15 | Discharge: 2014-12-15 | Disposition: A | Discharge status: Home / Self Care | Payer: Medicare Other | Attending: Emergency Medicine | Admitting: Emergency Medicine

## 2014-12-15 ENCOUNTER — Encounter (HOSPITAL_COMMUNITY): Payer: Self-pay | Admitting: Emergency Medicine

## 2014-12-15 ENCOUNTER — Emergency Department (HOSPITAL_COMMUNITY): Payer: Medicare Other

## 2014-12-15 DIAGNOSIS — I10 Essential (primary) hypertension: Secondary | ICD-10-CM | POA: Insufficient documentation

## 2014-12-15 DIAGNOSIS — J441 Chronic obstructive pulmonary disease with (acute) exacerbation: Secondary | ICD-10-CM | POA: Insufficient documentation

## 2014-12-15 DIAGNOSIS — R0602 Shortness of breath: Secondary | ICD-10-CM

## 2014-12-15 DIAGNOSIS — Z720 Tobacco use: Secondary | ICD-10-CM | POA: Diagnosis not present

## 2014-12-15 DIAGNOSIS — K029 Dental caries, unspecified: Secondary | ICD-10-CM | POA: Insufficient documentation

## 2014-12-15 DIAGNOSIS — J449 Chronic obstructive pulmonary disease, unspecified: Secondary | ICD-10-CM

## 2014-12-15 DIAGNOSIS — Z79899 Other long term (current) drug therapy: Secondary | ICD-10-CM | POA: Insufficient documentation

## 2014-12-15 DIAGNOSIS — G40909 Epilepsy, unspecified, not intractable, without status epilepticus: Secondary | ICD-10-CM | POA: Diagnosis not present

## 2014-12-15 DIAGNOSIS — J9809 Other diseases of bronchus, not elsewhere classified: Secondary | ICD-10-CM | POA: Diagnosis not present

## 2014-12-15 DIAGNOSIS — J45909 Unspecified asthma, uncomplicated: Secondary | ICD-10-CM | POA: Diagnosis not present

## 2014-12-15 DIAGNOSIS — R069 Unspecified abnormalities of breathing: Secondary | ICD-10-CM | POA: Diagnosis not present

## 2014-12-15 MED ORDER — ALBUTEROL SULFATE HFA 108 (90 BASE) MCG/ACT IN AERS
1.0000 | INHALATION_SPRAY | RESPIRATORY_TRACT | Status: DC | PRN
  Filled 2014-12-15 (×2): qty 6.7

## 2014-12-15 NOTE — Discharge Instructions (Signed)

## 2014-12-15 NOTE — ED Provider Notes (Addendum)
CSN: [id number]     Arrival date & time 12/15/14  0710 History   First MD Initiated Contact with Patient 12/15/14 0725     Chief Complaint  Patient presents with  . Shortness of Breath     (Consider location/radiation/quality/duration/timing/severity/associated sxs/prior Treatment) HPI Comments: Pt states that for the last year he has had intermittent episodes of being SOB sometimes while walking with occasional wheezing that started today while he was at the store.  He denies any chest pain.  Currently he is asymptomatic and states sx usually resolve with rest.  Also states he tends to get the sx at night and wheezes at night.  Intermittent cough but denies any change.  Pt does have hx of asthma but currently does not have inhaler.  Pt takes BP meds at home but has not taken them today.  No fever, congestion or swelling/weight change noted.  Pt is still using tobacco.  Patient is a 44 y.o. male presenting with shortness of breath. The history is provided by the patient.  Shortness of Breath Severity:  Moderate Onset quality:  Sudden Timing:  Intermittent Progression:  Resolved Chronicity:  Chronic Context: activity and weather changes   Context comment:  And at night Relieved by:  Rest Worsened by:  Activity Ineffective treatments:  None tried Associated symptoms: cough and wheezing   Associated symptoms: no abdominal pain, no chest pain, no fever, no sore throat and no vomiting   Risk factors: alcohol use and tobacco use   Risk factors: no hx of PE/DVT and no recent surgery     Past Medical History  Diagnosis Date  . Hypertension   . Seizures     last episode 03/2013  . Asthma   . Brain bleed    Past Surgical History  Procedure Laterality Date  . Leg surgery     Family History  Problem Relation Age of Onset  . Diabetes Mellitus II Sister   . Cancer Father    History  Substance Use Topics  . Smoking status: Current Every Day Smoker -- 1.00 packs/day for 26 years   Types: Cigarettes  . Smokeless tobacco: Never Used  . Alcohol Use: 2.4 oz/week    4 Cans of beer per week     Comment: drinks heavily. "pt sts he only drinks one beer a day now"    Review of Systems  Constitutional: Negative for fever.  HENT: Negative for sore throat.   Respiratory: Positive for cough, shortness of breath and wheezing.   Cardiovascular: Negative for chest pain.  Gastrointestinal: Negative for vomiting and abdominal pain.  All other systems reviewed and are negative.     Allergies  Dilaudid  Home Medications   Prior to Admission medications   Medication Sig Start Date End Date Taking? Authorizing Provider  amLODipine (NORVASC) 10 MG tablet Take 1 tablet (10 mg total) by mouth daily. 03/20/14   Charlynne Cousins, MD  atenolol (TENORMIN) 50 MG tablet Take 1 tablet (50 mg total) by mouth daily. 03/20/14   Charlynne Cousins, MD  cloNIDine (CATAPRES) 0.1 MG tablet Take 0.1 mg by mouth 2 (two) times daily. 11/23/14   Historical Provider, MD  hydrALAZINE (APRESOLINE) 50 MG tablet Take 1 tablet (50 mg total) by mouth every 8 (eight) hours. 03/20/14   Charlynne Cousins, MD  hydrochlorothiazide (HYDRODIURIL) 12.5 MG tablet Take 1 tablet (12.5 mg total) by mouth daily. 03/20/14   Charlynne Cousins, MD  phenytoin (DILANTIN) 100 MG ER capsule Take 300  mg by mouth 3 (three) times daily.  11/23/14   Historical Provider, MD  phenytoin (DILANTIN) 50 MG tablet Chew 100 mg by mouth 2 (two) times daily.    Historical Provider, MD   BP 171/119 mmHg  Pulse 99  Temp(Src) 98.3 F (36.8 C) (Oral)  Resp 15  SpO2 95% Physical Exam  Constitutional: He is oriented to person, place, and time. He appears well-developed and well-nourished. No distress.  HENT:  Head: Normocephalic and atraumatic.  Mouth/Throat: Oropharynx is clear and moist. Dental caries present.  Eyes: Conjunctivae and EOM are normal. Pupils are equal, round, and reactive to light.  Neck: Normal range of motion. Neck  supple.  Cardiovascular: Normal rate, regular rhythm and intact distal pulses.   No murmur heard. Pulmonary/Chest: Effort normal and breath sounds normal. No respiratory distress. He has no wheezes. He has no rales.  Abdominal: Soft. He exhibits no distension. There is no tenderness. There is no rebound and no guarding.  Musculoskeletal: Normal range of motion. He exhibits no edema or tenderness.  Neurological: He is alert and oriented to person, place, and time.  Skin: Skin is warm and dry. No rash noted. No erythema.  Psychiatric: He has a normal mood and affect. His behavior is normal.  Nursing note and vitals reviewed.   ED Course  Procedures (including critical care time) Labs Review Labs Reviewed - No data to display  Imaging Review Dg Chest 2 View  12/15/2014   CLINICAL DATA:  Chronic cough and chest pain over the past several months. Acute onset shortness of breath earlier today. Greater than 20 pack-year history of smoking. Current history of hypertension and asthma.  EXAM: CHEST  2 VIEW  COMPARISON:  CTA chest 12/01/2014. Chest x-rays 08/15/2014 dating back to 12/08/2012.  FINDINGS: Cardiac silhouette normal in size, unchanged. Thoracic aorta mildly tortuous, unchanged. Hilar and mediastinal contours otherwise unremarkable. Mildly prominent bronchovascular markings diffusely and mild to moderate central peribronchial thickening, unchanged, associated with hyperinflation. Lungs otherwise clear. No localized airspace consolidation. No pleural effusions. No pneumothorax. Normal pulmonary vascularity. Visualized bony thorax intact.  IMPRESSION: Stable COPD/emphysema.  No acute cardiopulmonary disease.   Electronically Signed   By: Evangeline Dakin M.D.   On: 12/15/2014 08:37     EKG Interpretation   Date/Time:  Saturday December 15 2014 07:15:44 EST Ventricular Rate:  96 PR Interval:  160 QRS Duration: 90 QT Interval:  359 QTC Calculation: 454 R Axis:   53 Text Interpretation:   Sinus rhythm LAE, consider biatrial enlargement Left  ventricular hypertrophy Baseline wander in lead(s) V1 No significant  change since last tracing Confirmed by Maryan Rued  MD, Loree Fee ([id number]) on  12/15/2014 7:39:56 AM      MDM   Final diagnoses:  SOB (shortness of breath)  Chronic obstructive pulmonary disease, unspecified COPD, unspecified chronic bronchitis type    Pt here c/o of intermittent SOB with exertion and at night with intermittent wheezing.  Also notices it is worse with weather change.  Pt states today he was at the store and started feeling SOB.  Pt felt like he was wheezing at the time however no wheezing noted here.  Pt denies infectious sx and currently does not have an inhaler even though he does have hx of asthma.   Pt was just seen 2 weeks ago in the ER for various unrelated complaints however at that time he had CTA of the chest which was normal, EKG, trop and lab work that was all wnl.  Pt denies any chest pain here and is hypertensive however he is on 4 different BP meds and has not taken meds this morning.  Feel that pt's sx are related to asthma.  Low suspicion for PE, infectious etiology or cardiac cause of sx.  EKG unchanged today.  Will get CXR and give albuterol to go for pt.  Currently he is asymptomatic sitting with legs crossed in the bed.  9:05 AM CXR with COPD.  Pt given inhaler and f/u with [facility].  Blanchie Dessert, MD 12/15/14 8325  Blanchie Dessert, MD 12/15/14 [phone]

## 2014-12-15 NOTE — ED Notes (Signed)
Pt is in stable condition upon d/c and refuses the use of a wheelchair. Pt ambulates independently from ED. Pt verbalizes understanding of d/c instructions and medications.

## 2014-12-15 NOTE — ED Notes (Signed)
Pt c/o sob after standing outside of store. Pt states he walked a few blocks before sx began. Pt has been out of his inhaler for months. Pt was c/o occasional chills and cough, pt denies any fever. Hx of HTN but did not take medications this morning.

## 2014-12-23 IMAGING — CR DG CHEST 2V
2 series · 2 of 2 positions shown · non-contrast
Comparison: Prior radiograph from 06/20/2014

CLINICAL DATA: Chest pain, shortness of breath

EXAM:
CHEST  2 VIEW

[w chest pa]
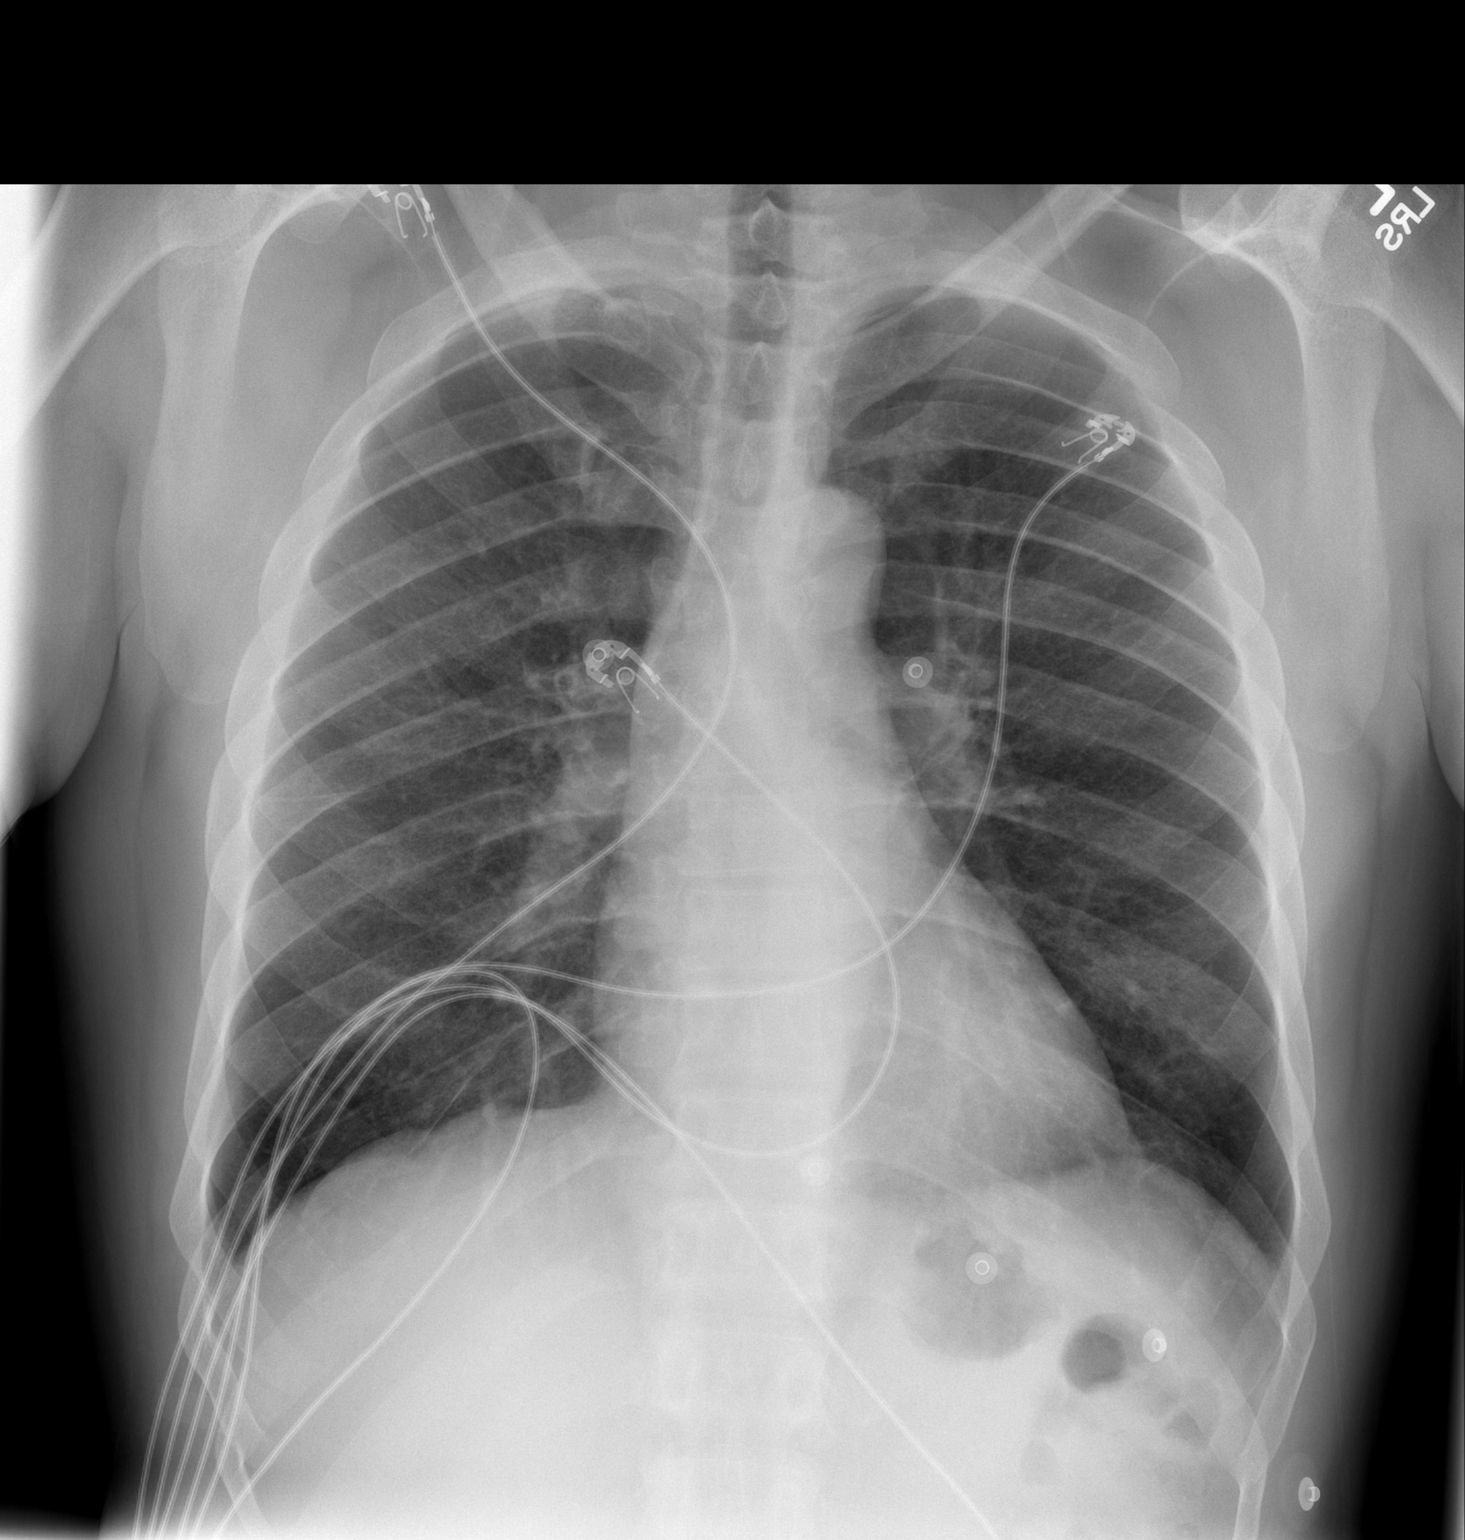

[w chest lat]
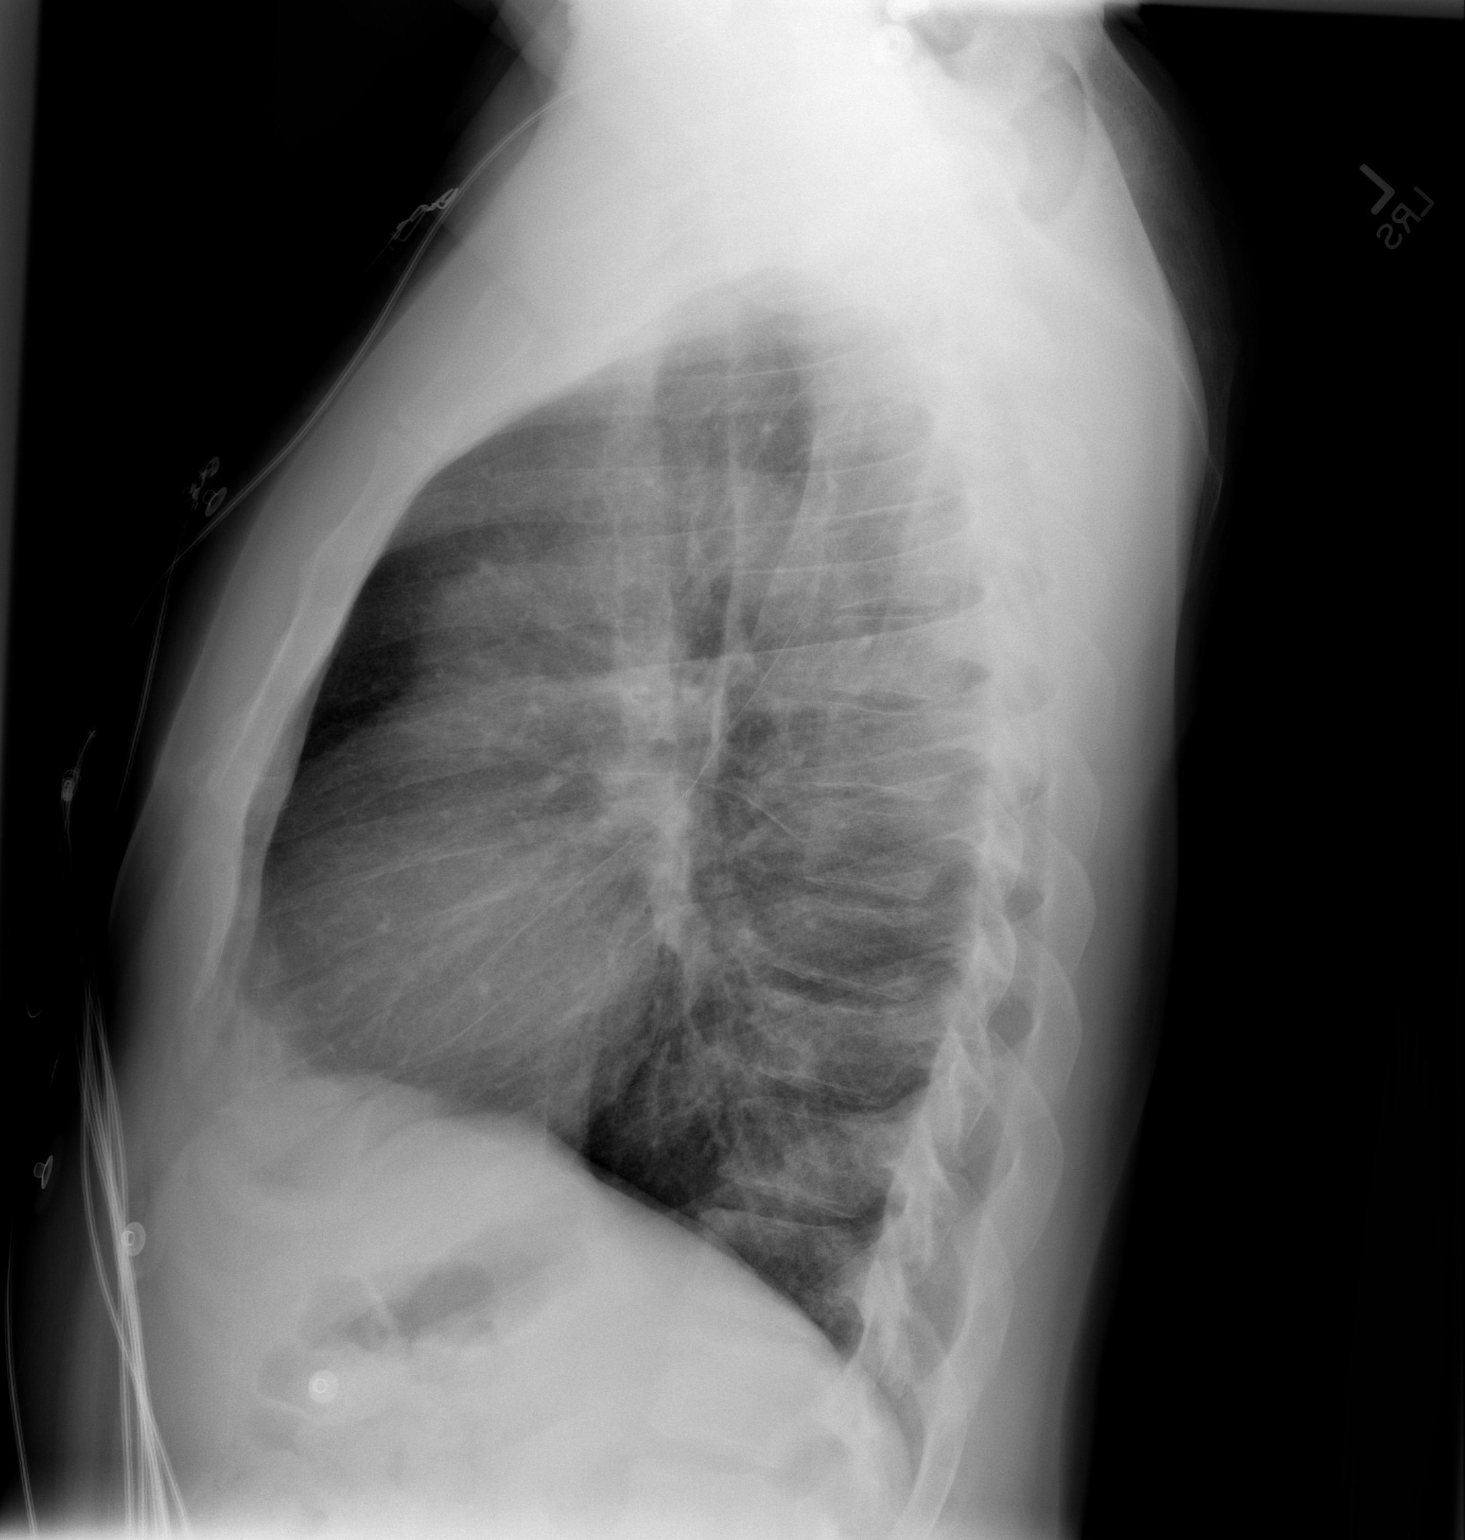

[2 of 2 positions shown; findings below may reference images not displayed]

FINDINGS: The cardiac and mediastinal silhouettes are stable in size and
contour, and remain within normal limits.

The lungs are normally inflated. No airspace consolidation, pleural
effusion, or pulmonary edema is identified. There is no
pneumothorax.

No acute osseous abnormality identified.
IMPRESSION: No active cardiopulmonary disease.

## 2014-12-27 ENCOUNTER — Emergency Department (HOSPITAL_COMMUNITY)
Admission: EM | Admit: 2014-12-27 | Discharge: 2014-12-27 | Discharge status: Against Medical Advice | Payer: Medicaid Other | Attending: Emergency Medicine | Admitting: Emergency Medicine

## 2014-12-27 ENCOUNTER — Encounter (HOSPITAL_COMMUNITY): Payer: Self-pay | Admitting: *Deleted

## 2014-12-27 DIAGNOSIS — I10 Essential (primary) hypertension: Secondary | ICD-10-CM | POA: Diagnosis not present

## 2014-12-27 DIAGNOSIS — G40909 Epilepsy, unspecified, not intractable, without status epilepticus: Secondary | ICD-10-CM | POA: Insufficient documentation

## 2014-12-27 DIAGNOSIS — R569 Unspecified convulsions: Secondary | ICD-10-CM | POA: Diagnosis present

## 2014-12-27 DIAGNOSIS — R51 Headache: Secondary | ICD-10-CM | POA: Diagnosis not present

## 2014-12-27 DIAGNOSIS — J45909 Unspecified asthma, uncomplicated: Secondary | ICD-10-CM | POA: Diagnosis not present

## 2014-12-27 DIAGNOSIS — Z720 Tobacco use: Secondary | ICD-10-CM | POA: Diagnosis not present

## 2014-12-27 DIAGNOSIS — R0602 Shortness of breath: Secondary | ICD-10-CM | POA: Diagnosis not present

## 2014-12-27 NOTE — ED Notes (Signed)
Per EMS, pt from home, wife reports focal seizure today after coming home from work.  Pt was unresponsive upon arrival to home.  When pt came to, pt refused treatment from EMS and refused transport to the ED initially.  Pt is A&OX 4 upon arrival to the ED.

## 2014-12-27 NOTE — ED Notes (Signed)
At this time, patient is refusing treatments, tests, and refusing to answer questions. Explained to patient the need to evaluate and treat his blood pressure, and questionable seizure activity. Patient states "i have medicine at home for that, ill take it when i get home" patient also states "i just got upset tonight and blacked out" patient continues to refuse treatment. Patient signed AMA at this time EDP notified. Patient states understanding of AMA and ambulatory out of department at this time

## 2014-12-27 NOTE — ED Notes (Signed)
CBG: 101

## 2015-01-07 IMAGING — CR DG CHEST 2V
2 series · 2 of 2 positions shown · non-contrast
Comparison: PA and lateral chest 07/31/2014 and 06/18/2013.

CLINICAL DATA: Chest pain, shortness of breath and dizziness.

EXAM:
CHEST  2 VIEW

[w chest pa]
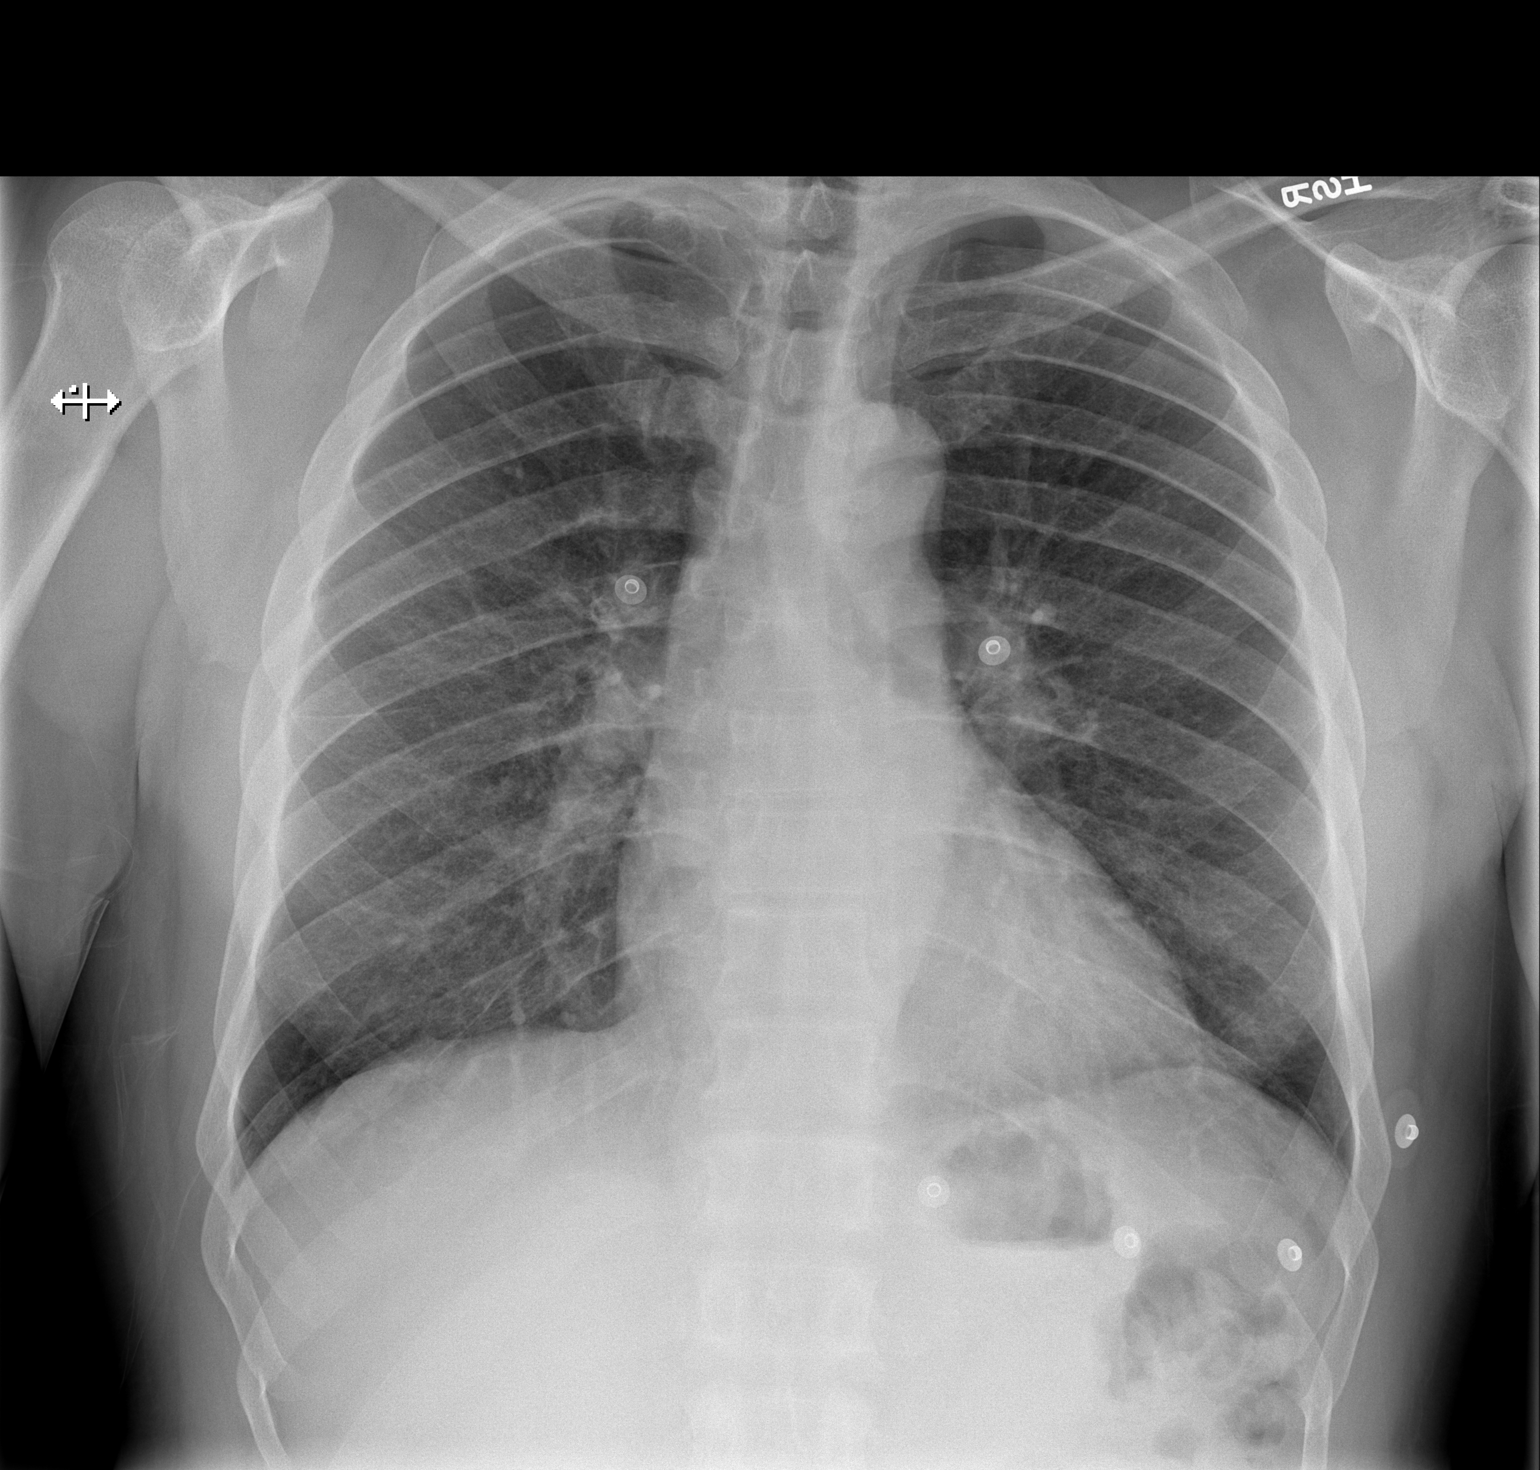

[w chest lat]
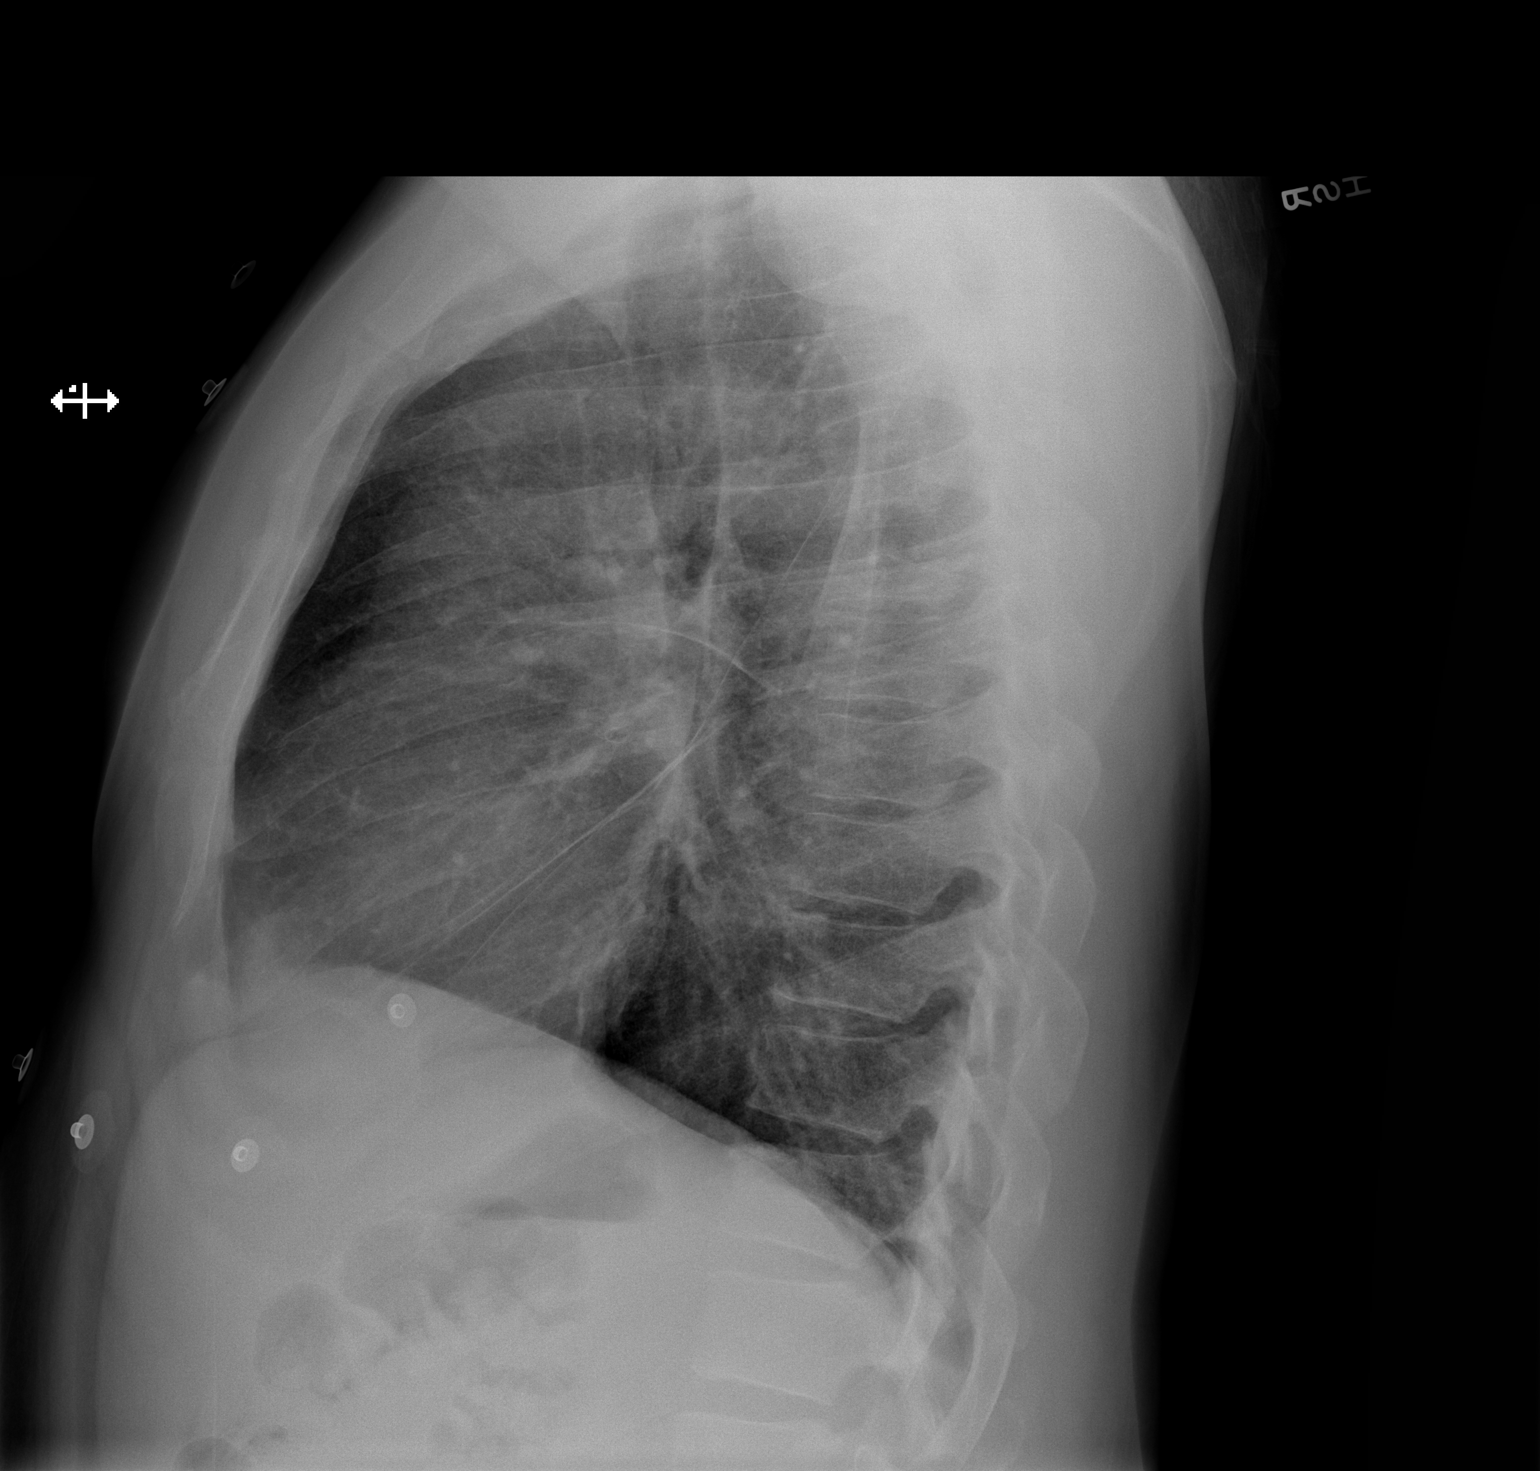

[2 of 2 positions shown; findings below may reference images not displayed]

FINDINGS: The lungs are clear. Heart size is normal. No pneumothorax or
pleural effusion. No focal bony abnormality.
IMPRESSION: Negative chest.

## 2015-02-11 ENCOUNTER — Emergency Department (HOSPITAL_COMMUNITY): Payer: Medicare Other

## 2015-02-11 ENCOUNTER — Encounter (HOSPITAL_COMMUNITY)
Admission: EM | Disposition: A | Discharge status: Home / Self Care | Payer: Self-pay | Source: Home / Self Care | Attending: Internal Medicine

## 2015-02-11 ENCOUNTER — Inpatient Hospital Stay (HOSPITAL_COMMUNITY)
Admission: EM | Admit: 2015-02-11 | Discharge: 2015-03-01 | DRG: 896 | Disposition: A | Discharge status: Home / Self Care | Payer: Medicare Other | Attending: Internal Medicine | Admitting: Internal Medicine

## 2015-02-11 ENCOUNTER — Encounter (HOSPITAL_COMMUNITY): Payer: Self-pay | Admitting: Emergency Medicine

## 2015-02-11 DIAGNOSIS — Z79899 Other long term (current) drug therapy: Secondary | ICD-10-CM

## 2015-02-11 DIAGNOSIS — E874 Mixed disorder of acid-base balance: Secondary | ICD-10-CM | POA: Diagnosis present

## 2015-02-11 DIAGNOSIS — I509 Heart failure, unspecified: Secondary | ICD-10-CM

## 2015-02-11 DIAGNOSIS — B958 Unspecified staphylococcus as the cause of diseases classified elsewhere: Secondary | ICD-10-CM | POA: Diagnosis not present

## 2015-02-11 DIAGNOSIS — F10231 Alcohol dependence with withdrawal delirium: Secondary | ICD-10-CM | POA: Diagnosis not present

## 2015-02-11 DIAGNOSIS — E43 Unspecified severe protein-calorie malnutrition: Secondary | ICD-10-CM | POA: Insufficient documentation

## 2015-02-11 DIAGNOSIS — J9621 Acute and chronic respiratory failure with hypoxia: Secondary | ICD-10-CM | POA: Diagnosis present

## 2015-02-11 DIAGNOSIS — G9340 Encephalopathy, unspecified: Secondary | ICD-10-CM | POA: Diagnosis not present

## 2015-02-11 DIAGNOSIS — R0902 Hypoxemia: Secondary | ICD-10-CM

## 2015-02-11 DIAGNOSIS — Z87820 Personal history of traumatic brain injury: Secondary | ICD-10-CM

## 2015-02-11 DIAGNOSIS — I82441 Acute embolism and thrombosis of right tibial vein: Secondary | ICD-10-CM | POA: Diagnosis not present

## 2015-02-11 DIAGNOSIS — R4182 Altered mental status, unspecified: Secondary | ICD-10-CM | POA: Diagnosis not present

## 2015-02-11 DIAGNOSIS — E876 Hypokalemia: Secondary | ICD-10-CM | POA: Diagnosis not present

## 2015-02-11 DIAGNOSIS — Z8673 Personal history of transient ischemic attack (TIA), and cerebral infarction without residual deficits: Secondary | ICD-10-CM | POA: Diagnosis not present

## 2015-02-11 DIAGNOSIS — R0602 Shortness of breath: Secondary | ICD-10-CM

## 2015-02-11 DIAGNOSIS — G40909 Epilepsy, unspecified, not intractable, without status epilepticus: Secondary | ICD-10-CM | POA: Diagnosis present

## 2015-02-11 DIAGNOSIS — Z4659 Encounter for fitting and adjustment of other gastrointestinal appliance and device: Secondary | ICD-10-CM

## 2015-02-11 DIAGNOSIS — E872 Acidosis, unspecified: Secondary | ICD-10-CM

## 2015-02-11 DIAGNOSIS — R7881 Bacteremia: Secondary | ICD-10-CM | POA: Diagnosis not present

## 2015-02-11 DIAGNOSIS — J810 Acute pulmonary edema: Secondary | ICD-10-CM | POA: Diagnosis not present

## 2015-02-11 DIAGNOSIS — R079 Chest pain, unspecified: Secondary | ICD-10-CM | POA: Diagnosis not present

## 2015-02-11 DIAGNOSIS — R401 Stupor: Secondary | ICD-10-CM | POA: Diagnosis not present

## 2015-02-11 DIAGNOSIS — I824Z9 Acute embolism and thrombosis of unspecified deep veins of unspecified distal lower extremity: Secondary | ICD-10-CM | POA: Insufficient documentation

## 2015-02-11 DIAGNOSIS — F17200 Nicotine dependence, unspecified, uncomplicated: Secondary | ICD-10-CM

## 2015-02-11 DIAGNOSIS — F10120 Alcohol abuse with intoxication, uncomplicated: Secondary | ICD-10-CM | POA: Diagnosis not present

## 2015-02-11 DIAGNOSIS — Z720 Tobacco use: Secondary | ICD-10-CM | POA: Diagnosis not present

## 2015-02-11 DIAGNOSIS — I10 Essential (primary) hypertension: Secondary | ICD-10-CM | POA: Diagnosis not present

## 2015-02-11 DIAGNOSIS — R569 Unspecified convulsions: Secondary | ICD-10-CM | POA: Insufficient documentation

## 2015-02-11 DIAGNOSIS — G92 Toxic encephalopathy: Secondary | ICD-10-CM | POA: Diagnosis present

## 2015-02-11 DIAGNOSIS — R680 Hypothermia, not associated with low environmental temperature: Secondary | ICD-10-CM | POA: Diagnosis not present

## 2015-02-11 DIAGNOSIS — Y906 Blood alcohol level of 120-199 mg/100 ml: Secondary | ICD-10-CM | POA: Diagnosis not present

## 2015-02-11 DIAGNOSIS — F10129 Alcohol abuse with intoxication, unspecified: Secondary | ICD-10-CM | POA: Diagnosis not present

## 2015-02-11 DIAGNOSIS — I422 Other hypertrophic cardiomyopathy: Secondary | ICD-10-CM | POA: Diagnosis not present

## 2015-02-11 DIAGNOSIS — F10929 Alcohol use, unspecified with intoxication, unspecified: Secondary | ICD-10-CM | POA: Diagnosis present

## 2015-02-11 DIAGNOSIS — J9600 Acute respiratory failure, unspecified whether with hypoxia or hypercapnia: Secondary | ICD-10-CM | POA: Diagnosis not present

## 2015-02-11 DIAGNOSIS — R0600 Dyspnea, unspecified: Secondary | ICD-10-CM

## 2015-02-11 DIAGNOSIS — J9610 Chronic respiratory failure, unspecified whether with hypoxia or hypercapnia: Secondary | ICD-10-CM | POA: Diagnosis present

## 2015-02-11 DIAGNOSIS — J9601 Acute respiratory failure with hypoxia: Secondary | ICD-10-CM | POA: Diagnosis present

## 2015-02-11 DIAGNOSIS — I2699 Other pulmonary embolism without acute cor pulmonale: Secondary | ICD-10-CM | POA: Diagnosis not present

## 2015-02-11 DIAGNOSIS — F10931 Alcohol use, unspecified with withdrawal delirium: Secondary | ICD-10-CM | POA: Diagnosis present

## 2015-02-11 DIAGNOSIS — F17210 Nicotine dependence, cigarettes, uncomplicated: Secondary | ICD-10-CM | POA: Diagnosis present

## 2015-02-11 LAB — CBC WITH DIFFERENTIAL/PLATELET
Basophils Absolute: 0 10*3/uL (ref 0.0–0.1)
Basophils Relative: 0 % (ref 0–1)
EOS PCT: 0 % (ref 0–5)
Eosinophils Absolute: 0 10*3/uL (ref 0.0–0.7)
HEMATOCRIT: 48.5 % (ref 39.0–52.0)
Hemoglobin: 16.7 g/dL (ref 13.0–17.0)
Lymphocytes Relative: 22 % (ref 12–46)
Lymphs Abs: 1.6 10*3/uL (ref 0.7–4.0)
MCH: 31.5 pg (ref 26.0–34.0)
MCHC: 34.4 g/dL (ref 30.0–36.0)
MCV: 91.5 fL (ref 78.0–100.0)
MONO ABS: 0.4 10*3/uL (ref 0.1–1.0)
Monocytes Relative: 5 % (ref 3–12)
NEUTROS ABS: 5.3 10*3/uL (ref 1.7–7.7)
Neutrophils Relative %: 73 % (ref 43–77)
Platelets: 235 10*3/uL (ref 150–400)
RBC: 5.3 MIL/uL (ref 4.22–5.81)
RDW: 14.2 % (ref 11.5–15.5)
WBC: 7.3 10*3/uL (ref 4.0–10.5)

## 2015-02-11 LAB — COMPREHENSIVE METABOLIC PANEL
ALK PHOS: 79 U/L (ref 39–117)
ALT: 23 U/L (ref 0–53)
AST: 30 U/L (ref 0–37)
Albumin: 4 g/dL (ref 3.5–5.2)
Anion gap: 13 (ref 5–15)
BUN: 11 mg/dL (ref 6–23)
CHLORIDE: 113 mmol/L — AB (ref 96–112)
CO2: 16 mmol/L — AB (ref 19–32)
CREATININE: 1.1 mg/dL (ref 0.50–1.35)
Calcium: 9.2 mg/dL (ref 8.4–10.5)
GFR calc Af Amer: >90 mL/min (ref >90)
GFR calc non Af Amer: 80 mL/min — ABNORMAL LOW (ref >90)
Glucose, Bld: 88 mg/dL (ref 70–99)
Potassium: 4.4 mmol/L (ref 3.5–5.1)
Sodium: 142 mmol/L (ref 135–145)
Total Bilirubin: 0.5 mg/dL (ref 0.3–1.2)
Total Protein: 7.4 g/dL (ref 6.0–8.3)

## 2015-02-11 LAB — I-STAT ARTERIAL BLOOD GAS, ED
Acid-base deficit: 7 mmol/L — ABNORMAL HIGH (ref 0.0–2.0)
Bicarbonate: 19.3 mEq/L — ABNORMAL LOW (ref 20.0–24.0)
O2 SAT: 93 %
PCO2 ART: 41.4 mmHg (ref 35.0–45.0)
Patient temperature: 98.9
TCO2: 21 mmol/L (ref 0–100)
pH, Arterial: 7.278 — ABNORMAL LOW (ref 7.350–7.450)
pO2, Arterial: 75 mmHg — ABNORMAL LOW (ref 80.0–100.0)

## 2015-02-11 LAB — TYPE AND SCREEN
ABO/RH(D): A POS
Antibody Screen: NEGATIVE

## 2015-02-11 LAB — I-STAT CHEM 8, ED
BUN: 12 mg/dL (ref 6–23)
CHLORIDE: 110 mmol/L (ref 96–112)
Calcium, Ion: 1.08 mmol/L — ABNORMAL LOW (ref 1.12–1.23)
Creatinine, Ser: 1.3 mg/dL (ref 0.50–1.35)
Glucose, Bld: 78 mg/dL (ref 70–99)
HCT: 55 % — ABNORMAL HIGH (ref 39.0–52.0)
Hemoglobin: 18.7 g/dL — ABNORMAL HIGH (ref 13.0–17.0)
POTASSIUM: 4.2 mmol/L (ref 3.5–5.1)
SODIUM: 143 mmol/L (ref 135–145)
TCO2: 16 mmol/L (ref 0–100)

## 2015-02-11 LAB — CBG MONITORING, ED: Glucose-Capillary: 82 mg/dL (ref 70–99)

## 2015-02-11 LAB — LIPASE, BLOOD: Lipase: 68 U/L — ABNORMAL HIGH (ref 11–59)

## 2015-02-11 LAB — SALICYLATE LEVEL

## 2015-02-11 LAB — I-STAT TROPONIN, ED: TROPONIN I, POC: 0 ng/mL (ref 0.00–0.08)

## 2015-02-11 LAB — ACETAMINOPHEN LEVEL: Acetaminophen (Tylenol), Serum: <10 ug/mL — ABNORMAL LOW (ref 10–30)

## 2015-02-11 LAB — PROTIME-INR
INR: 1 (ref 0.00–1.49)
PROTHROMBIN TIME: 13.3 s (ref 11.6–15.2)

## 2015-02-11 LAB — CK: Total CK: 141 U/L (ref 7–232)

## 2015-02-11 LAB — I-STAT CG4 LACTIC ACID, ED: Lactic Acid, Venous: 3 mmol/L (ref 0.5–2.0)

## 2015-02-11 LAB — ETHANOL: Alcohol, Ethyl (B): 159 mg/dL — ABNORMAL HIGH (ref 0–9)

## 2015-02-11 SURGERY — LEFT HEART CATH
Anesthesia: LOCAL

## 2015-02-11 MED ORDER — LORAZEPAM 2 MG/ML IJ SOLN
2.0000 mg | Freq: Once | INTRAMUSCULAR | Status: AC
  Administered 2015-02-11: 2 mg via INTRAMUSCULAR

## 2015-02-11 MED ORDER — HYDRALAZINE HCL 20 MG/ML IJ SOLN
5.0000 mg | INTRAMUSCULAR | Status: DC | PRN
  Administered 2015-02-12 (×4): 5 mg via INTRAVENOUS
  Filled 2015-02-11 (×4): qty 1

## 2015-02-11 MED ORDER — SODIUM CHLORIDE 0.9 % IV BOLUS (SEPSIS)
1000.0000 mL | Freq: Once | INTRAVENOUS | Status: AC
  Administered 2015-02-11: 1000 mL via INTRAVENOUS

## 2015-02-11 MED ORDER — SODIUM CHLORIDE 0.9 % IV SOLN
INTRAVENOUS | Status: DC
  Administered 2015-02-12 (×2): via INTRAVENOUS

## 2015-02-11 MED ORDER — SODIUM CHLORIDE 0.9 % IJ SOLN
3.0000 mL | Freq: Two times a day (BID) | INTRAMUSCULAR | Status: DC
  Administered 2015-02-12 – 2015-03-01 (×27): 3 mL via INTRAVENOUS

## 2015-02-11 MED ORDER — THIAMINE HCL 100 MG/ML IJ SOLN
Freq: Once | INTRAVENOUS | Status: AC
  Administered 2015-02-12: 01:00:00 via INTRAVENOUS
  Filled 2015-02-11: qty 1000

## 2015-02-11 MED ORDER — LORAZEPAM 2 MG/ML IJ SOLN
4.0000 mg | Freq: Once | INTRAMUSCULAR | Status: AC
  Administered 2015-02-11: 4 mg via INTRAVENOUS
  Filled 2015-02-11: qty 2

## 2015-02-11 MED ORDER — PANTOPRAZOLE SODIUM 40 MG IV SOLR
40.0000 mg | INTRAVENOUS | Status: DC
  Administered 2015-02-12 – 2015-02-20 (×10): 40 mg via INTRAVENOUS
  Filled 2015-02-11 (×12): qty 40

## 2015-02-11 MED ORDER — HEPARIN SODIUM (PORCINE) 5000 UNIT/ML IJ SOLN
5000.0000 [IU] | Freq: Three times a day (TID) | INTRAMUSCULAR | Status: DC
  Administered 2015-02-12 – 2015-02-14 (×8): 5000 [IU] via SUBCUTANEOUS
  Filled 2015-02-11 (×11): qty 1

## 2015-02-11 MED ORDER — LORAZEPAM 2 MG/ML IJ SOLN
2.0000 mg | Freq: Once | INTRAMUSCULAR | Status: AC
  Administered 2015-02-11: 2 mg via INTRAVENOUS

## 2015-02-11 NOTE — ED Notes (Signed)
Pt uncooperative, pt's wife called EMS, wife reports pt has been vomiting tonight, emesis contained blood, wife reports the pt has lost consciousness on and off. Pt reports chest pain, back pain, arm pain.

## 2015-02-11 NOTE — Consult Note (Signed)
Consult Reason for Consult: altered mental status, breakthrough seizures Referring Physician: Dr Christy Gentles  CC: altered mental status, breakthrough seizures  HPI: Richard [name] is an 44 y.o. male with history of HTN, seizures, prior [place] presenting with altered mental status and multiple suspected partial complex seizures. EMS was called to patients house for chest pain, on ride over noted to have multiple episodes of patient staring off blankly, confused look, not responding. Upon arrival noted to be alert, responsive, GCS of 15. Patient had subsequent episode of staring off, after which he was oriented but very agitated and attempted to leave AMA. Given total of 8mg  ativan in the ED.   Followed by [facility], Dr Leta Baptist, for seizure history. He has a history of TBI in 1993 which is when his seizures started. He also has a history of EtOH and cocaine abuse.  Head CT shows no acute process, evidence of left frontal encephalomalacia (imaging reviewed). Lab work pertinent for elevated lactic acid, EtOH level of 159. Dilantin level pending.   Past Medical History  Diagnosis Date  . Hypertension   . Seizures     last episode 03/2013  . Asthma   . Brain bleed     Past Surgical History  Procedure Laterality Date  . Leg surgery      Family History  Problem Relation Age of Onset  . Diabetes Mellitus II Sister   . Cancer Father     Social History:  reports that he has been smoking Cigarettes.  He has a 26 pack-year smoking history. He has never used smokeless tobacco. He reports that he drinks about 2.4 oz of alcohol per week. He reports that he uses illicit drugs (Cocaine).  Allergies  Allergen Reactions  . Dilaudid [Hydromorphone Hcl] Other (See Comments)    Bradycardia and Nausea    Medications: I have reviewed the patient's current medications.  ROS: Out of a complete 14 system review, the patient complains of only the following symptoms, and all other reviewed systems are  negative. Unable to obtain due to mental status  Physical Examination: Filed Vitals:   02/11/15 2253  BP:   Pulse: 85  Temp:   Resp: 27   Physical Exam  Constitutional: He appears well-developed and well-nourished.  Psych: Affect appropriate to situation Eyes: No scleral injection HENT: No OP obstrucion Head: Normocephalic.  Cardiovascular: Normal rate and regular rhythm.  Respiratory: Effort normal and breath sounds normal.  GI: Soft. Bowel sounds are normal. No distension. There is no tenderness.  Skin: WDI  Neurologic Examination (limited due to lethargy) Mental Status: Lethargic but opens eyes to voice. Oriented to name only. Intermittently will follow simple commands.  Cranial Nerves: II: unable to visualize fundi, blinks to threat bilaterally, pupils equal, round, reactive to light  III,IV, VI: ptosis not present, not following commands but appears to track fully to right and left V,VII: smile symmetric, VIII: hearing normal bilaterally IX,X: unable to test XI: unable to test XII: unable to test Motor: Due to mental status unable to formally test but appears to move all extremities symmetrically and against gravity Tone and bulk:normal tone throughout; no atrophy noted Sensory: withdrawals to noxious stimulus symmetrically  Deep Tendon Reflexes: 2+ and symmetric throughout Plantars: Right: downgoing   Left: downgoing Cerebellar: Unable to test Gait: unable to test  Laboratory Studies:   Basic Metabolic Panel:  Recent Labs Lab 02/11/15 2043 02/11/15 2109  NA 142 143  K 4.4 4.2  CL 113* 110  CO2 16*  --  GLUCOSE 88 78  BUN 11 12  CREATININE 1.10 1.30  CALCIUM 9.2  --     Liver Function Tests:  Recent Labs Lab 02/11/15 2043  AST 30  ALT 23  ALKPHOS 79  BILITOT 0.5  PROT 7.4  ALBUMIN 4.0    Recent Labs Lab 02/11/15 2043  LIPASE 68*   No results for input(s): AMMONIA in the last 168 hours.  CBC:  Recent Labs Lab 02/11/15 2043  02/11/15 2109  WBC 7.3  --   NEUTROABS 5.3  --   HGB 16.7 18.7*  HCT 48.5 55.0*  MCV 91.5  --   PLT 235  --     Cardiac Enzymes:  Recent Labs Lab 02/11/15 2043  CKTOTAL 141    BNP: Invalid input(s): POCBNP  CBG:  Recent Labs Lab 02/11/15 2102  GLUCAP 21    Microbiology: Results for orders placed or performed during the hospital encounter of 03/19/14  MRSA PCR Screening     Status: None   Collection Time: 03/19/14  1:44 PM  Result Value Ref Range Status   MRSA by PCR NEGATIVE NEGATIVE Final    Comment:        The GeneXpert MRSA Assay (FDA approved for NASAL specimens only), is one component of a comprehensive MRSA colonization surveillance program. It is not intended to diagnose MRSA infection nor to guide or monitor treatment for MRSA infections.    Coagulation Studies:  Recent Labs  02/11/15 2043  LABPROT 13.3  INR 1.00    Urinalysis: No results for input(s): COLORURINE, LABSPEC, PHURINE, GLUCOSEU, HGBUR, BILIRUBINUR, KETONESUR, PROTEINUR, UROBILINOGEN, NITRITE, LEUKOCYTESUR in the last 168 hours.  Invalid input(s): APPERANCEUR  Lipid Panel:     Component Value Date/Time   CHOL 138 12/24/2013 0630   TRIG 65 12/24/2013 0630   HDL 45 12/24/2013 0630   CHOLHDL 3.1 12/24/2013 0630   VLDL 13 12/24/2013 0630   LDLCALC 80 12/24/2013 0630    HgbA1C:  Lab Results  Component Value Date   HGBA1C 6.1* 12/24/2013    Urine Drug Screen:     Component Value Date/Time   LABOPIA NONE DETECTED 08/15/2014 1412   COCAINSCRNUR POSITIVE* 08/15/2014 1412   LABBENZ NONE DETECTED 08/15/2014 1412   AMPHETMU NONE DETECTED 08/15/2014 1412   THCU NONE DETECTED 08/15/2014 1412   LABBARB NONE DETECTED 08/15/2014 1412    Alcohol Level:  Recent Labs Lab 02/11/15 2057  ETH 159*    Other results:  Imaging: Ct Head Wo Contrast  02/11/2015   CLINICAL DATA:  Altered mental status  EXAM: CT HEAD WITHOUT CONTRAST  TECHNIQUE: Contiguous axial images were  obtained from the base of the skull through the vertex without intravenous contrast.  COMPARISON:  08/15/2014  FINDINGS: Left frontal encephalomalacia reidentified. Diffuse mild cortical volume loss with proportional ventricular prominence reidentified. Areas of small vessel ischemic change manifesting as white matter hypodensity are reidentified. No acute hemorrhage, infarct, or mass lesion is identified. Chronic left maxillary sinusitis reidentified. No acute osseous abnormality.  IMPRESSION: No acute intracranial abnormality. Left frontal encephalomalacia reidentified.  Chronic left maxillary sinusitis.   Electronically Signed   By: Conchita Paris M.D.   On: 02/11/2015 21:49   Dg Chest Portable 1 View  02/11/2015   CLINICAL DATA:  Initial evaluation for acute chest pain, emesis.  EXAM: PORTABLE CHEST - 1 VIEW  COMPARISON:  Prior radiograph from 12/15/2014  FINDINGS: Transverse heart size within normal limits on this AP projection. Mediastinal silhouette within normal limits.  Lungs are  normally inflated. Changes compatible with COPD/ emphysema again seen. No focal infiltrate, pulmonary edema, or pleural effusion. No pneumothorax.  No acute osseus abnormality.  IMPRESSION: COPD/emphysema. No superimposed active cardiopulmonary disease identified.   Electronically Signed   By: Jeannine Boga M.D.   On: 02/11/2015 21:06     Assessment/Plan:  43y/o gentleman with history of seizures, TBI, EtOH and cocaine abuse presenting with breakthrough seizures. Exam limited due to recent administration of multiple doses of ativan but overall appears non-focal. Lab work pertinent for EtOH level of 159. Dilantin level pending. Will hold on stat EEG as suspect current lethargy related to multiple doses of ativan.   Unclear home dose of dilantin. Home medications have listed dilantin ER 300mg  three times a day and Dilantin IR 100mg  two times a day. Last note from outpatient neurologist is from 12/2013 and lists  Dilantin 300mg  daily.   -dilantin level pending. Attempted to contact family to clarify dosing but unable to reach anyone. Pending clarification start dilantin 300mg  qhs. Will give bolus as indicated if level returns low -check urine drug screen -seizure precautions -will need to monitor for EtOH withdrawal      Jim Like, DO [facility] [phone]  If 7pm- 7am, please page neurology on call as listed in [place]. 02/11/2015, 10:55 PM

## 2015-02-11 NOTE — ED Provider Notes (Signed)
CSN: [id number]     Arrival date & time 02/11/15  2024 History   First MD Initiated Contact with Patient 02/11/15 2032     Chief Complaint  Patient presents with  . Chest Pain   Level 5 caveat due to altered mental status   Patient is a 44 y.o. male presenting with altered mental status. The history is provided by the EMS personnel. The history is limited by the condition of the patient.  Altered Mental Status Presenting symptoms: confusion   Severity:  Moderate Timing:  Intermittent Progression:  Waxing and waning Chronicity:  New Associated symptoms comment:  Chest pain Hematemesis  Patient presents from home for multiple complaints: EMS reports they were called for chest pain and also vomiting blood On their arrival pt was very combative and would have episodes of staring and appeared confused  No other details are known on arrival   Past Medical History  Diagnosis Date  . Hypertension   . Seizures     last episode 03/2013  . Asthma   . Brain bleed    Past Surgical History  Procedure Laterality Date  . Leg surgery     Family History  Problem Relation Age of Onset  . Diabetes Mellitus II Sister   . Cancer Father    History  Substance Use Topics  . Smoking status: Current Every Day Smoker -- 1.00 packs/day for 26 years    Types: Cigarettes  . Smokeless tobacco: Never Used  . Alcohol Use: 2.4 oz/week    4 Cans of beer per week     Comment: drinks heavily. "pt sts he only drinks one beer a day now"    Review of Systems  Unable to perform ROS: Mental status change  Psychiatric/Behavioral: Positive for confusion.      Allergies  Dilaudid  Home Medications   Prior to Admission medications   Medication Sig Start Date End Date Taking? Authorizing Provider  amLODipine (NORVASC) 10 MG tablet Take 1 tablet (10 mg total) by mouth daily. 03/20/14   Charlynne Cousins, MD  atenolol (TENORMIN) 50 MG tablet Take 1 tablet (50 mg total) by mouth daily. 03/20/14    Charlynne Cousins, MD  cloNIDine (CATAPRES) 0.1 MG tablet Take 0.1 mg by mouth 2 (two) times daily. 11/23/14   Historical Provider, MD  hydrALAZINE (APRESOLINE) 50 MG tablet Take 1 tablet (50 mg total) by mouth every 8 (eight) hours. 03/20/14   Charlynne Cousins, MD  hydrochlorothiazide (HYDRODIURIL) 12.5 MG tablet Take 1 tablet (12.5 mg total) by mouth daily. 03/20/14   Charlynne Cousins, MD  phenytoin (DILANTIN) 100 MG ER capsule Take 300 mg by mouth 3 (three) times daily.  11/23/14   Historical Provider, MD  phenytoin (DILANTIN) 50 MG tablet Chew 100 mg by mouth 2 (two) times daily.    Historical Provider, MD   BP 128/87 mmHg  Pulse 88  Temp(Src) 98.9 F (37.2 C) (Oral)  Resp 21  Ht 6' (1.829 m)  Wt 225 lb (102.059 kg)  BMI 30.51 kg/m2  SpO2 93% Physical Exam CONSTITUTIONAL: disheveled, smells of ETOH HEAD: Normocephalic/atraumatic EYES: EOMI/PERRL ENMT: Mucous membranes moist NECK: supple no meningeal signs SPINE/BACK:entire spine nontender CV: S1/S2 noted, no murmurs/rubs/gallops noted LUNGS: Lungs are clear to auscultation bilaterally, no apparent distress ABDOMEN: soft, nontender, no rebound or guarding, bowel sounds noted throughout abdomen GU:no cva tenderness NEURO: Pt is awake/alert.  He moves all extremitiesx4.  He will have episodes of staring and not respond then wake  up and yell at staff   EXTREMITIES: pulses normal/equal, full ROM SKIN: warm, color normal PSYCH: intermittently agitated  ED Course  Procedures  CRITICAL CARE Performed by: Sharyon Cable Total critical care time: 32 Critical care time was exclusive of separately billable procedures and treating other patients. Critical care was necessary to treat or prevent imminent or life-threatening deterioration. Critical care was time spent personally by me on the following activities: development of treatment plan with patient and/or surrogate as well as nursing, discussions with consultants, evaluation  of patient's response to treatment, examination of patient, obtaining history from patient or surrogate, ordering and performing treatments and interventions, ordering and review of laboratory studies, ordering and review of radiographic studies, pulse oximetry and re-evaluation of patient's condition. PATIENT WITH ALTERED MENTAL STATUS, POSSIBLE SEIZURE ACTIVITY, HE HAS METABOLIC ACIDOSIS AND HAS REQUIRED IV FLUIDS.  PT WAS ALSO AGITATED AND REQUIRED 2 DOSES OF ATIVAN.  CARE TRANSFERRED TO MEDICINE AND ALSO NEUROLOGY  8:50 PM Pt seen on arrival for concern for Code STEMI On arrival initial EKG reviewed with Dr Tommi Rumps with cardiology - no change from prior and Code STEMI cancelled Pt now having episodes of unresponsiveness Per records, he has h/o seizure activity with similar episodes However it is reported he had cp and vomitng blood Imaging/labs ordered Will follow closely 10:20 PM Pt resting comfortably, but requires oxygen as he was given ativan With h/o hematemesis - rectal performed with nurse, no blood noted or melena noted Suspect pt has had seizure and his AMS was worsened with ETOH use Will admit for observation 10:48 PM D/W DR NEWTON WILL ADMIT D/W NEUROLOGY WILL EVALUATE SUSPECT PT HAS HAD [place] Review Labs Reviewed  COMPREHENSIVE METABOLIC PANEL - Abnormal; Notable for the following:    Chloride 113 (*)    CO2 16 (*)    GFR calc non Af Amer 80 (*)    All other components within normal limits  LIPASE, BLOOD - Abnormal; Notable for the following:    Lipase 68 (*)    All other components within normal limits  ACETAMINOPHEN LEVEL - Abnormal; Notable for the following:    Acetaminophen (Tylenol), Serum <10.0 (*)    All other components within normal limits  ETHANOL - Abnormal; Notable for the following:    Alcohol, Ethyl (B) 159 (*)    All other components within normal limits  I-STAT CG4 LACTIC ACID, ED - Abnormal; Notable for the  following:    Lactic Acid, Venous 3.00 (*)    All other components within normal limits  I-STAT CHEM 8, ED - Abnormal; Notable for the following:    Calcium, Ion 1.08 (*)    Hemoglobin 18.7 (*)    HCT 55.0 (*)    All other components within normal limits  I-STAT ARTERIAL BLOOD GAS, ED - Abnormal; Notable for the following:    pH, Arterial 7.278 (*)    pO2, Arterial 75.0 (*)    Bicarbonate 19.3 (*)    Acid-base deficit 7.0 (*)    All other components within normal limits  CBC WITH DIFFERENTIAL/PLATELET  PROTIME-INR  CK  SALICYLATE LEVEL  PHENYTOIN LEVEL, TOTAL  I-STAT TROPOININ, ED  CBG MONITORING, ED  POC OCCULT BLOOD, ED  TYPE AND SCREEN    Imaging Review Ct Head Wo Contrast  02/11/2015   CLINICAL DATA:  Altered mental status  EXAM: CT HEAD WITHOUT CONTRAST  TECHNIQUE: Contiguous axial images were obtained from the base of the skull through  the vertex without intravenous contrast.  COMPARISON:  08/15/2014  FINDINGS: Left frontal encephalomalacia reidentified. Diffuse mild cortical volume loss with proportional ventricular prominence reidentified. Areas of small vessel ischemic change manifesting as white matter hypodensity are reidentified. No acute hemorrhage, infarct, or mass lesion is identified. Chronic left maxillary sinusitis reidentified. No acute osseous abnormality.  IMPRESSION: No acute intracranial abnormality. Left frontal encephalomalacia reidentified.  Chronic left maxillary sinusitis.   Electronically Signed   By: Conchita Paris M.D.   On: 02/11/2015 21:49   Dg Chest Portable 1 View  02/11/2015   CLINICAL DATA:  Initial evaluation for acute chest pain, emesis.  EXAM: PORTABLE CHEST - 1 VIEW  COMPARISON:  Prior radiograph from 12/15/2014  FINDINGS: Transverse heart size within normal limits on this AP projection. Mediastinal silhouette within normal limits.  Lungs are normally inflated. Changes compatible with COPD/ emphysema again seen. No focal infiltrate, pulmonary  edema, or pleural effusion. No pneumothorax.  No acute osseus abnormality.  IMPRESSION: COPD/emphysema. No superimposed active cardiopulmonary disease identified.   Electronically Signed   By: Jeannine Boga M.D.   On: 02/11/2015 21:06     EKG Interpretation   Date/Time:  Monday February 11 2015 20:34:47 EST Ventricular Rate:  101 PR Interval:  155 QRS Duration: 91 QT Interval:  348 QTC Calculation: 451 R Axis:   39 Text Interpretation:  Sinus tachycardia Biatrial enlargement Probable left  ventricular hypertrophy Anterior Q waves, possibly due to LVH Baseline  wander in lead(s) V2 No significant change since last tracing Confirmed by  Christy Gentles  MD, Irean Kendricks ([id number]) on 02/11/2015 8:41:59 PM     Medications  LORazepam (ATIVAN) injection 4 mg (not administered)  sodium chloride 0.9 % bolus 1,000 mL (not administered)  LORazepam (ATIVAN) injection 2 mg (2 mg Intramuscular Given by Other 02/11/15 2045)  LORazepam (ATIVAN) injection 2 mg (2 mg Intravenous Given 02/11/15 2057)  sodium chloride 0.9 % bolus 1,000 mL (1,000 mLs Intravenous New Bag/Given 02/11/15 2200)    MDM   Final diagnoses:  Altered mental status, unspecified altered mental status type  Chest pain, unspecified chest pain type  Metabolic acidosis  Alcohol intoxication, with unspecified complication    Nursing notes including past medical history and social history reviewed and considered in documentation xrays/imaging reviewed by myself and considered during evaluation Labs/vital reviewed myself and considered during evaluation Previous records reviewed and considered     Sharyon Cable, MD 02/11/15 2251

## 2015-02-11 NOTE — ED Notes (Signed)
Admitting MD at BS.  

## 2015-02-11 NOTE — ED Provider Notes (Signed)
I was called to room by nurse Pt had been given ativan again at 2245 and now more somnolent O2 NRB placed to patient with proper positioning and he has no hypoxia Good chest wall rise Awaiting admission  Sharyon Cable, MD 02/11/15 2313

## 2015-02-11 NOTE — ED Notes (Signed)
Pt refusing to allow this RN take his rectal temperature.

## 2015-02-11 NOTE — H&P (Signed)
Hospitalist Admission History and Physical  Patient name: Richard [name] Medical record number: [id number] Date of birth: Oct 01, 1971 Age: 44 y.o. Gender: male  Primary Care Provider: [name], MD  Chief Complaint: encephalopathy, alcohol intoxication, lactic acidosis  History of Present Illness:This is a 44 y.o. year old male with significant past medical history of seizure disorder, TIA, polysubstance abuse, HTN w/ hx/o malignant HTN presenting with encephalopathy, alcohol intoxication, lactic acidosis. Per report, EMS was called for patient for multiple nonspecific complaints and including possible STEMI, hematemesis. Per report, on arrival EMS noted patient to be combative. Noted intermittent episodes of loss of consciousness as well as reports of chest pain back pain and arm pain. Was initially refusing transport. However, patient was able to be transported. EKG of time was concerning for? STEMI. However, EKG was consistent to prior tracings in the past. Patient noted recurrent episodes of encephalopathy, combativeness, loss of consciousness/staring spells concerning for seizure. Hemodynamically stable on presentation. Hemoccult negative. Head CT within normal limits. Notable findings include lactic acidosis with lactate of 3 and bicarbonate of 16. Ethanol level of 159. LFTs within normal limits. UDS pending. Dilantin level pending.  Assessment and Plan:  Active Problems:   Encephalopathy   Alcohol intoxication   Lactic acidosis   1- Encephalopathy -multifactorial in setting of seizure disorder and ETOH intoxication -initially highly  combative on presentation w/ intermittent staring spells per report  -s/p multiple doses of ativan in ER -neuro consult pending-appreciate input  -f/u ammonia -no overt sources of infection currently (CXR WNL apart from COPD-UA pending, afebrile, WBC normal) -[facility] protocol   2-ETOH intoxication  -banana bag x 1 -f/u UDS -f/u ammonia level   -[facility] protocol  -follow  3-Lactic Acidosis  -likely secondary to above -salicylate level WNL -currently receiving IVF bolus in ER -f/u lactate and trend   4- Seizure disorder -dilantin level pending -neuro consult  -f/u recommendations  5- HTN -LLN BPs on presentation  -hold oral meds for now  -prn hydralazine for SBP> 175   FEN/GI: NPOD  Prophylaxis: sub q heparin  Disposition: pending further evaluation  Code Status:Full Code    Patient Active Problem List   Diagnosis Date Noted  . Encephalopathy 02/11/2015  . Bleeding from the nose 03/19/2014  . TIA (transient ischemic attack) 12/24/2013  . Weakness 12/23/2013  . Left-sided weakness 12/23/2013  . Malignant hypertension 12/23/2013  . Seizure disorder 12/23/2013  . Vomiting 06/18/2013  . Chest pain 06/18/2013  . Hypertensive urgency 10/30/2012  . Migraine variant 10/30/2012  . TOBACCO ABUSE 01/11/2008  . HYPERTENSION, BENIGN 11/24/2007  . CHRONIC FRONTAL SINUSITIS 11/24/2007  . SEIZURE DISORDER 11/24/2007  . Headache(784.0) 11/24/2007   Past Medical History: Past Medical History  Diagnosis Date  . Hypertension   . Seizures     last episode 03/2013  . Asthma   . Brain bleed     Past Surgical History: Past Surgical History  Procedure Laterality Date  . Leg surgery      Social History: History   Social History  . Marital Status: Married    Spouse Name: Lattie Haw  . Number of Children: 1  . Years of Education: HS   Occupational History  .  Other    n/a   Social History Main Topics  . Smoking status: Current Every Day Smoker -- 1.00 packs/day for 26 years    Types: Cigarettes  . Smokeless tobacco: Never Used  . Alcohol Use: 2.4 oz/week    4 Cans of beer per week  Comment: drinks heavily. "pt sts he only drinks one beer a day now"  . Drug Use: Yes    Special: Cocaine     Comment: last use several years ago   . Sexual Activity: Yes   Other Topics Concern  . None   Social History  Narrative   Patient lives at home alone.   Caffeine Use: 4-5 cups daily    Family History: Family History  Problem Relation Age of Onset  . Diabetes Mellitus II Sister   . Cancer Father     Allergies: Allergies  Allergen Reactions  . Dilaudid [Hydromorphone Hcl] Other (See Comments)    Bradycardia and Nausea    Current Facility-Administered Medications  Medication Dose Route Frequency Provider Last Rate Last Dose  . 0.9 %  sodium chloride infusion   Intravenous Continuous Shanda Howells, MD      . heparin injection 5,000 Units  5,000 Units Subcutaneous 3 times per day Shanda Howells, MD      . sodium chloride 0.9 % 1,000 mL with thiamine 433 mg, folic acid 1 mg, multivitamins adult 10 mL infusion   Intravenous Once Shanda Howells, MD      . sodium chloride 0.9 % injection 3 mL  3 mL Intravenous Q12H Shanda Howells, MD       Current Outpatient Prescriptions  Medication Sig Dispense Refill  . amLODipine (NORVASC) 10 MG tablet Take 1 tablet (10 mg total) by mouth daily. 30 tablet 1  . atenolol (TENORMIN) 50 MG tablet Take 1 tablet (50 mg total) by mouth daily. 30 tablet 3  . cloNIDine (CATAPRES) 0.1 MG tablet Take 0.1 mg by mouth 2 (two) times daily.  6  . hydrALAZINE (APRESOLINE) 50 MG tablet Take 1 tablet (50 mg total) by mouth every 8 (eight) hours. 90 tablet 3  . hydrochlorothiazide (HYDRODIURIL) 12.5 MG tablet Take 1 tablet (12.5 mg total) by mouth daily. 30 tablet 0  . phenytoin (DILANTIN) 100 MG ER capsule Take 300 mg by mouth 3 (three) times daily.   12  . phenytoin (DILANTIN) 50 MG tablet Chew 100 mg by mouth 2 (two) times daily.     Review Of Systems: 12 point ROS negative except as noted above in HPI.  Physical Exam: Filed Vitals:   02/11/15 2253  BP:   Pulse: 85  Temp:   Resp: 27    General: lethargic, intermittently combative to exam  HEENT: sclera clear, anicteric and pupils sluggish but reactive to light  Heart: S1, S2 normal, no murmur, rub or gallop, regular  rate and rhythm Lungs: unlabored breathing and bibasilar rales Abdomen: abdomen is soft without significant tenderness, masses, organomegaly or guarding Extremities: extremities normal, atraumatic, no cyanosis or edema Skin:no rashes Neurology: uncooperative to exam  Labs and Imaging: Lab Results  Component Value Date/Time   NA 143 02/11/2015 09:09 PM   K 4.2 02/11/2015 09:09 PM   CL 110 02/11/2015 09:09 PM   CO2 16* 02/11/2015 08:43 PM   BUN 12 02/11/2015 09:09 PM   CREATININE 1.30 02/11/2015 09:09 PM   CREATININE 0.91 12/05/2013 04:05 PM   GLUCOSE 78 02/11/2015 09:09 PM   Lab Results  Component Value Date   WBC 7.3 02/11/2015   HGB 18.7* 02/11/2015   HCT 55.0* 02/11/2015   MCV 91.5 02/11/2015   PLT 235 02/11/2015    Ct Head Wo Contrast  02/11/2015   CLINICAL DATA:  Altered mental status  EXAM: CT HEAD WITHOUT CONTRAST  TECHNIQUE: Contiguous axial images were obtained  from the base of the skull through the vertex without intravenous contrast.  COMPARISON:  08/15/2014  FINDINGS: Left frontal encephalomalacia reidentified. Diffuse mild cortical volume loss with proportional ventricular prominence reidentified. Areas of small vessel ischemic change manifesting as white matter hypodensity are reidentified. No acute hemorrhage, infarct, or mass lesion is identified. Chronic left maxillary sinusitis reidentified. No acute osseous abnormality.  IMPRESSION: No acute intracranial abnormality. Left frontal encephalomalacia reidentified.  Chronic left maxillary sinusitis.   Electronically Signed   By: Conchita Paris M.D.   On: 02/11/2015 21:49   Dg Chest Portable 1 View  02/11/2015   CLINICAL DATA:  Initial evaluation for acute chest pain, emesis.  EXAM: PORTABLE CHEST - 1 VIEW  COMPARISON:  Prior radiograph from 12/15/2014  FINDINGS: Transverse heart size within normal limits on this AP projection. Mediastinal silhouette within normal limits.  Lungs are normally inflated. Changes compatible  with COPD/ emphysema again seen. No focal infiltrate, pulmonary edema, or pleural effusion. No pneumothorax.  No acute osseus abnormality.  IMPRESSION: COPD/emphysema. No superimposed active cardiopulmonary disease identified.   Electronically Signed   By: Jeannine Boga M.D.   On: 02/11/2015 21:06           Shanda Howells MD  Pager: [phone]

## 2015-02-12 ENCOUNTER — Other Ambulatory Visit (HOSPITAL_COMMUNITY): Payer: Medicaid Other

## 2015-02-12 DIAGNOSIS — I10 Essential (primary) hypertension: Secondary | ICD-10-CM | POA: Diagnosis present

## 2015-02-12 DIAGNOSIS — G40909 Epilepsy, unspecified, not intractable, without status epilepticus: Secondary | ICD-10-CM

## 2015-02-12 DIAGNOSIS — E872 Acidosis, unspecified: Secondary | ICD-10-CM | POA: Diagnosis present

## 2015-02-12 DIAGNOSIS — R4182 Altered mental status, unspecified: Secondary | ICD-10-CM | POA: Diagnosis present

## 2015-02-12 LAB — CBC WITH DIFFERENTIAL/PLATELET
Basophils Absolute: 0 10*3/uL (ref 0.0–0.1)
Basophils Relative: 0 % (ref 0–1)
EOS ABS: 0 10*3/uL (ref 0.0–0.7)
EOS PCT: 0 % (ref 0–5)
HCT: 43.8 % (ref 39.0–52.0)
HEMOGLOBIN: 14.6 g/dL (ref 13.0–17.0)
LYMPHS ABS: 2.3 10*3/uL (ref 0.7–4.0)
Lymphocytes Relative: 34 % (ref 12–46)
MCH: 31 pg (ref 26.0–34.0)
MCHC: 33.3 g/dL (ref 30.0–36.0)
MCV: 93 fL (ref 78.0–100.0)
MONO ABS: 0.7 10*3/uL (ref 0.1–1.0)
Monocytes Relative: 11 % (ref 3–12)
Neutro Abs: 3.8 10*3/uL (ref 1.7–7.7)
Neutrophils Relative %: 55 % (ref 43–77)
Platelets: 223 10*3/uL (ref 150–400)
RBC: 4.71 MIL/uL (ref 4.22–5.81)
RDW: 14.3 % (ref 11.5–15.5)
WBC: 6.8 10*3/uL (ref 4.0–10.5)

## 2015-02-12 LAB — TROPONIN I: Troponin I: <0.03 ng/mL (ref <0.031)

## 2015-02-12 LAB — COMPREHENSIVE METABOLIC PANEL
ALT: 21 U/L (ref 0–53)
ANION GAP: 13 (ref 5–15)
AST: 33 U/L (ref 0–37)
Albumin: 3.3 g/dL — ABNORMAL LOW (ref 3.5–5.2)
Alkaline Phosphatase: 62 U/L (ref 39–117)
BILIRUBIN TOTAL: 0.7 mg/dL (ref 0.3–1.2)
BUN: 8 mg/dL (ref 6–23)
CALCIUM: 8.3 mg/dL — AB (ref 8.4–10.5)
CHLORIDE: 112 mmol/L (ref 96–112)
CO2: 19 mmol/L (ref 19–32)
CREATININE: 1.11 mg/dL (ref 0.50–1.35)
GFR calc Af Amer: >90 mL/min (ref >90)
GFR, EST NON AFRICAN AMERICAN: 79 mL/min — AB (ref >90)
GLUCOSE: 86 mg/dL (ref 70–99)
Potassium: 4.5 mmol/L (ref 3.5–5.1)
Sodium: 144 mmol/L (ref 135–145)
Total Protein: 6 g/dL (ref 6.0–8.3)

## 2015-02-12 LAB — BLOOD GAS, ARTERIAL
Acid-base deficit: 5.5 mmol/L — ABNORMAL HIGH (ref 0.0–2.0)
Bicarbonate: 20.4 mEq/L (ref 20.0–24.0)
Drawn by: 235321
FIO2: 1 %
O2 Saturation: 75.3 %
Patient temperature: 98.6
TCO2: 21.9 mmol/L (ref 0–100)
pCO2 arterial: 47.2 mmHg — ABNORMAL HIGH (ref 35.0–45.0)
pH, Arterial: 7.259 — ABNORMAL LOW (ref 7.350–7.450)
pO2, Arterial: 48 mmHg — ABNORMAL LOW (ref 80.0–100.0)

## 2015-02-12 LAB — URINALYSIS, ROUTINE W REFLEX MICROSCOPIC
Bilirubin Urine: NEGATIVE
Glucose, UA: NEGATIVE mg/dL
Hgb urine dipstick: NEGATIVE
Ketones, ur: NEGATIVE mg/dL
LEUKOCYTES UA: NEGATIVE
NITRITE: NEGATIVE
PH: 5.5 (ref 5.0–8.0)
Protein, ur: NEGATIVE mg/dL
SPECIFIC GRAVITY, URINE: 1.015 (ref 1.005–1.030)
UROBILINOGEN UA: 0.2 mg/dL (ref 0.0–1.0)

## 2015-02-12 LAB — AMMONIA: AMMONIA: 45 umol/L — AB (ref 11–32)

## 2015-02-12 LAB — RAPID URINE DRUG SCREEN, HOSP PERFORMED
Amphetamines: NOT DETECTED
Barbiturates: NOT DETECTED
Benzodiazepines: POSITIVE — AB
Cocaine: POSITIVE — AB
OPIATES: NOT DETECTED
Tetrahydrocannabinol: NOT DETECTED

## 2015-02-12 LAB — PHENYTOIN LEVEL, TOTAL: Phenytoin Lvl: <2.5 ug/mL — ABNORMAL LOW (ref 10.0–20.0)

## 2015-02-12 LAB — TSH: TSH: 1.46 u[IU]/mL (ref 0.350–4.500)

## 2015-02-12 LAB — LACTIC ACID, PLASMA: LACTIC ACID, VENOUS: 1.7 mmol/L (ref 0.5–2.0)

## 2015-02-12 LAB — MRSA PCR SCREENING: MRSA BY PCR: NEGATIVE

## 2015-02-12 MED ORDER — HALOPERIDOL LACTATE 5 MG/ML IJ SOLN
5.0000 mg | Freq: Once | INTRAMUSCULAR | Status: AC
  Administered 2015-02-12: 5 mg via INTRAVENOUS

## 2015-02-12 MED ORDER — LEVETIRACETAM IN NACL 1000 MG/100ML IV SOLN
1000.0000 mg | INTRAVENOUS | Status: AC
  Administered 2015-02-12: 1000 mg via INTRAVENOUS
  Filled 2015-02-12: qty 100

## 2015-02-12 MED ORDER — HALOPERIDOL LACTATE 5 MG/ML IJ SOLN
5.0000 mg | Freq: Once | INTRAMUSCULAR | Status: AC
  Administered 2015-02-12: 5 mg via INTRAVENOUS
  Filled 2015-02-12: qty 1

## 2015-02-12 MED ORDER — LORAZEPAM 2 MG/ML IJ SOLN
4.0000 mg | Freq: Once | INTRAMUSCULAR | Status: AC
  Administered 2015-02-12: 4 mg via INTRAVENOUS

## 2015-02-12 MED ORDER — LORAZEPAM 2 MG/ML IJ SOLN
2.0000 mg | Freq: Once | INTRAMUSCULAR | Status: AC
  Administered 2015-02-12: 2 mg via INTRAVENOUS

## 2015-02-12 MED ORDER — SODIUM CHLORIDE 0.9 % IV SOLN
INTRAVENOUS | Status: DC | PRN
  Administered 2015-02-12 – 2015-02-18 (×4): 250 mL via INTRAVENOUS
  Administered 2015-02-24: 10 mL/h via INTRAVENOUS

## 2015-02-12 MED ORDER — LORAZEPAM 2 MG/ML IJ SOLN
1.0000 mg | Freq: Four times a day (QID) | INTRAMUSCULAR | Status: DC | PRN
  Filled 2015-02-12: qty 1

## 2015-02-12 MED ORDER — SODIUM CHLORIDE 0.9 % IV SOLN
1000.0000 mg | Freq: Once | INTRAVENOUS | Status: DC
  Administered 2015-02-12: 1000 mg via INTRAVENOUS
  Filled 2015-02-12: qty 20

## 2015-02-12 MED ORDER — LORAZEPAM 2 MG/ML IJ SOLN
2.0000 mg | INTRAMUSCULAR | Status: DC | PRN
  Administered 2015-02-12: 3 mg via INTRAVENOUS
  Administered 2015-02-12: 2 mg via INTRAVENOUS
  Administered 2015-02-12: 1 mg via INTRAVENOUS
  Administered 2015-02-12 (×3): 2 mg via INTRAVENOUS
  Administered 2015-02-12: 3 mg via INTRAVENOUS
  Administered 2015-02-12: 2 mg via INTRAVENOUS
  Filled 2015-02-12 (×3): qty 1
  Filled 2015-02-12: qty 2
  Filled 2015-02-12 (×2): qty 1
  Filled 2015-02-12 (×3): qty 2

## 2015-02-12 MED ORDER — LORAZEPAM 1 MG PO TABS: 1.0000 mg | ORAL_TABLET | Freq: Four times a day (QID) | ORAL | Status: DC | PRN

## 2015-02-12 MED ORDER — FOLIC ACID 1 MG PO TABS
1.0000 mg | ORAL_TABLET | Freq: Every day | ORAL | Status: DC
  Filled 2015-02-12 (×3): qty 1

## 2015-02-12 MED ORDER — HALOPERIDOL LACTATE 5 MG/ML IJ SOLN
INTRAMUSCULAR | Status: AC
  Administered 2015-02-12: 5 mg via INTRAVENOUS
  Filled 2015-02-12: qty 1

## 2015-02-12 MED ORDER — LORAZEPAM 2 MG/ML IJ SOLN
INTRAMUSCULAR | Status: AC
  Administered 2015-02-12: 4 mg via INTRAVENOUS
  Filled 2015-02-12: qty 2

## 2015-02-12 MED ORDER — HYDRALAZINE HCL 20 MG/ML IJ SOLN
15.0000 mg | INTRAMUSCULAR | Status: DC | PRN
  Administered 2015-02-12: 10 mg via INTRAVENOUS
  Administered 2015-02-12: 15 mg via INTRAVENOUS
  Filled 2015-02-12 (×2): qty 1

## 2015-02-12 MED ORDER — CETYLPYRIDINIUM CHLORIDE 0.05 % MT LIQD
7.0000 mL | Freq: Two times a day (BID) | OROMUCOSAL | Status: DC
  Administered 2015-02-13 – 2015-02-18 (×11): 7 mL via OROMUCOSAL

## 2015-02-12 MED ORDER — LEVETIRACETAM IN NACL 500 MG/100ML IV SOLN
500.0000 mg | Freq: Two times a day (BID) | INTRAVENOUS | Status: DC
  Administered 2015-02-12 – 2015-02-21 (×18): 500 mg via INTRAVENOUS
  Filled 2015-02-12 (×20): qty 100

## 2015-02-12 MED ORDER — LORAZEPAM 2 MG/ML IJ SOLN
INTRAMUSCULAR | Status: AC
  Filled 2015-02-12: qty 1

## 2015-02-12 MED ORDER — LORAZEPAM 2 MG/ML IJ SOLN: 2.0000 mg | INTRAMUSCULAR | Status: DC | PRN

## 2015-02-12 MED ORDER — DIAZEPAM 5 MG/ML IJ SOLN
5.0000 mg | INTRAMUSCULAR | Status: DC | PRN
  Administered 2015-02-12 – 2015-02-13 (×4): 10 mg via INTRAVENOUS
  Filled 2015-02-12 (×4): qty 2

## 2015-02-12 MED ORDER — HYDRALAZINE HCL 20 MG/ML IJ SOLN
20.0000 mg | INTRAMUSCULAR | Status: DC
  Administered 2015-02-12 – 2015-02-15 (×14): 20 mg via INTRAVENOUS
  Filled 2015-02-12 (×22): qty 1

## 2015-02-12 MED ORDER — VITAMIN B-1 100 MG PO TABS
100.0000 mg | ORAL_TABLET | Freq: Every day | ORAL | Status: DC
  Filled 2015-02-12 (×4): qty 1

## 2015-02-12 MED ORDER — LORAZEPAM 2 MG/ML IJ SOLN
2.0000 mg | Freq: Once | INTRAMUSCULAR | Status: AC
  Administered 2015-02-12: 2 mg via INTRAVENOUS
  Filled 2015-02-12: qty 1

## 2015-02-12 MED ORDER — LABETALOL HCL 5 MG/ML IV SOLN
5.0000 mg | INTRAVENOUS | Status: DC | PRN
  Administered 2015-02-12: 5 mg via INTRAVENOUS
  Filled 2015-02-12: qty 4

## 2015-02-12 MED ORDER — HALOPERIDOL LACTATE 5 MG/ML IJ SOLN
5.0000 mg | Freq: Three times a day (TID) | INTRAMUSCULAR | Status: DC | PRN
  Administered 2015-02-12: 5 mg via INTRAVENOUS
  Filled 2015-02-12 (×2): qty 1

## 2015-02-12 MED ORDER — THIAMINE HCL 100 MG/ML IJ SOLN
100.0000 mg | Freq: Every day | INTRAMUSCULAR | Status: DC
  Administered 2015-02-14: 100 mg via INTRAVENOUS
  Filled 2015-02-12 (×4): qty 1

## 2015-02-12 MED ORDER — ADULT MULTIVITAMIN W/MINERALS CH
1.0000 | ORAL_TABLET | Freq: Every day | ORAL | Status: DC
  Filled 2015-02-12 (×4): qty 1

## 2015-02-12 NOTE — Progress Notes (Signed)
Subjective: patient is alert and stating he would like to go home.  He follows commands but goes in between being sleepy and then agitated and trying to get out of restraints. He has received 16 mg Ativan since 0000 hours for both seizure and agitation.   Objective: Current vital signs: BP 171/131 mmHg  Pulse 83  Temp(Src) 97.5 F (36.4 C) (Axillary)  Resp 21  Ht 6' (1.829 m)  Wt 102.059 kg (225 lb)  BMI 30.51 kg/m2  SpO2 98% Vital signs in last 24 hours: Temp:  [97.3 F (36.3 C)-98.9 F (37.2 C)] 97.5 F (36.4 C) (03/01 0749) Pulse Rate:  [74-96] 83 (03/01 0749) Resp:  [14-27] 21 (03/01 0749) BP: (118-183)/(71-131) 171/131 mmHg (03/01 0749) SpO2:  [93 %-98 %] 98 % (03/01 0749) Weight:  [102.059 kg (225 lb)] 102.059 kg (225 lb) (02/29 2046)  Intake/Output from previous day: 02/29 0701 - 03/01 0700 In: 1626.7 [I.V.:1626.7] Out: 400 [Urine:400] Intake/Output this shift:   Nutritional status: Diet NPO time specified  Neurologic Exam: General: Mental Status: Alert and goes in between drowsy and agitated. not oriented and believes he is at home.  Speech fluent without evidence of aphasia.  Able to follow simple commands without difficulty. Cranial Nerves: II: Discs flat bilaterally; Visual fields grossly normal, pupils equal, round, reactive to light and accommodation III,IV, VI: ptosis not present, extra-ocular motions intact bilaterally V,VII: smile symmetric, facial light touch sensation normal bilaterally VIII: hearing normal bilaterally IX,X: gag reflex present XI: bilateral shoulder shrug XII: midline tongue extension without atrophy or fasciculations  Motor: In two point restraints but when agitated moving all extremities with 5/5 strength Sensory: Pinprick and light touch intact throughout, bilaterally Deep Tendon Reflexes:  Right: Upper Extremity   Left: Upper extremity   biceps (C-5 to C-6) 2/4   biceps (C-5 to C-6) 2/4 tricep (C7) 2/4    triceps (C7)  2/4 Brachioradialis (C6) 2/4  Brachioradialis (C6) 2/4  Lower Extremity Lower Extremity  quadriceps (L-2 to L-4) 2/4   quadriceps (L-2 to L-4) 2/4 Achilles (S1) 2/4   Achilles (S1) 2/4  Plantars: Right: downgoing   Left: downgoing Cerebellar: normal finger-to-nose unable to obtain Heel to shin    Lab Results: Basic Metabolic Panel:  Recent Labs Lab 02/11/15 2043 02/11/15 2109 02/12/15 0210  NA 142 143 144  K 4.4 4.2 4.5  CL 113* 110 112  CO2 16*  --  19  GLUCOSE 88 78 86  BUN 11 12 8   CREATININE 1.10 1.30 1.11  CALCIUM 9.2  --  8.3*    Liver Function Tests:  Recent Labs Lab 02/11/15 2043 02/12/15 0210  AST 30 33  ALT 23 21  ALKPHOS 79 62  BILITOT 0.5 0.7  PROT 7.4 6.0  ALBUMIN 4.0 3.3*    Recent Labs Lab 02/11/15 2043  LIPASE 68*    Recent Labs Lab 02/12/15 0545  AMMONIA 45*    CBC:  Recent Labs Lab 02/11/15 2043 02/11/15 2109 02/12/15 0210  WBC 7.3  --  6.8  NEUTROABS 5.3  --  3.8  HGB 16.7 18.7* 14.6  HCT 48.5 55.0* 43.8  MCV 91.5  --  93.0  PLT 235  --  223    Cardiac Enzymes:  Recent Labs Lab 02/11/15 2043 02/12/15 0210 02/12/15 0545  CKTOTAL 141  --   --   TROPONINI  --  <0.03 <0.03    Lipid Panel: No results for input(s): CHOL, TRIG, HDL, CHOLHDL, VLDL, LDLCALC in the last 168  hours.  CBG:  Recent Labs Lab 02/11/15 2102  GLUCAP 79    Microbiology: Results for orders placed or performed during the hospital encounter of 02/11/15  MRSA PCR Screening     Status: None   Collection Time: 02/12/15  1:25 AM  Result Value Ref Range Status   MRSA by PCR NEGATIVE NEGATIVE Final    Comment:        The GeneXpert MRSA Assay (FDA approved for NASAL specimens only), is one component of a comprehensive MRSA colonization surveillance program. It is not intended to diagnose MRSA infection nor to guide or monitor treatment for MRSA infections.     Coagulation Studies:  Recent Labs  02/11/15 2043  LABPROT 13.3   INR 1.00    Imaging: Ct Head Wo Contrast  02/11/2015   CLINICAL DATA:  Altered mental status  EXAM: CT HEAD WITHOUT CONTRAST  TECHNIQUE: Contiguous axial images were obtained from the base of the skull through the vertex without intravenous contrast.  COMPARISON:  08/15/2014  FINDINGS: Left frontal encephalomalacia reidentified. Diffuse mild cortical volume loss with proportional ventricular prominence reidentified. Areas of small vessel ischemic change manifesting as white matter hypodensity are reidentified. No acute hemorrhage, infarct, or mass lesion is identified. Chronic left maxillary sinusitis reidentified. No acute osseous abnormality.  IMPRESSION: No acute intracranial abnormality. Left frontal encephalomalacia reidentified.  Chronic left maxillary sinusitis.   Electronically Signed   By: Conchita Paris M.D.   On: 02/11/2015 21:49   Dg Chest Portable 1 View  02/11/2015   CLINICAL DATA:  Initial evaluation for acute chest pain, emesis.  EXAM: PORTABLE CHEST - 1 VIEW  COMPARISON:  Prior radiograph from 12/15/2014  FINDINGS: Transverse heart size within normal limits on this AP projection. Mediastinal silhouette within normal limits.  Lungs are normally inflated. Changes compatible with COPD/ emphysema again seen. No focal infiltrate, pulmonary edema, or pleural effusion. No pneumothorax.  No acute osseus abnormality.  IMPRESSION: COPD/emphysema. No superimposed active cardiopulmonary disease identified.   Electronically Signed   By: Jeannine Boga M.D.   On: 02/11/2015 21:06    Medications:  Prior to Admission:  Prescriptions prior to admission  Medication Sig Dispense Refill Last Dose  . amLODipine (NORVASC) 10 MG tablet Take 1 tablet (10 mg total) by mouth daily. 30 tablet 1 12/14/2014 at Unknown time  . atenolol (TENORMIN) 50 MG tablet Take 1 tablet (50 mg total) by mouth daily. 30 tablet 3 12/14/2014 at 0900  . cloNIDine (CATAPRES) 0.1 MG tablet Take 0.1 mg by mouth 2 (two) times  daily.  6 12/14/2014 at Unknown time  . hydrALAZINE (APRESOLINE) 50 MG tablet Take 1 tablet (50 mg total) by mouth every 8 (eight) hours. 90 tablet 3 12/14/2014 at Unknown time  . hydrochlorothiazide (HYDRODIURIL) 12.5 MG tablet Take 1 tablet (12.5 mg total) by mouth daily. 30 tablet 0 12/14/2014 at Unknown time  . phenytoin (DILANTIN) 100 MG ER capsule Take 300 mg by mouth 3 (three) times daily.   12 12/14/2014 at Unknown time  . phenytoin (DILANTIN) 50 MG tablet Chew 100 mg by mouth 2 (two) times daily.   12/14/2014 at Unknown time   Scheduled: . antiseptic oral rinse  7 mL Mouth Rinse BID  . folic acid  1 mg Oral Daily  . heparin  5,000 Units Subcutaneous 3 times per day  . levETIRAcetam  1,000 mg Intravenous STAT  . levETIRAcetam  500 mg Intravenous Q12H  . multivitamin with minerals  1 tablet Oral Daily  .  pantoprazole (PROTONIX) IV  40 mg Intravenous Q24H  . sodium chloride  3 mL Intravenous Q12H  . thiamine  100 mg Oral Daily   Or  . thiamine  100 mg Intravenous Daily   Continuous: . sodium chloride 100 mL/hr at 02/12/15 0044  . sodium chloride 250 mL (02/12/15 0836)    Assessment/Plan:  43y/o gentleman with history of seizures, TBI, EtOH and cocaine abuse presenting with breakthrough seizures. Currently he has received a total of 16 mg Ativan since 0000 hours for agitation. He is currently agitated and showing no seizure activity. ethanol on arrival 159 and UDS pending. Will load with Keppra 1 gram and give maintenance dose of 500 mg BID. BpP has been elevated and primary team currently adjusting medications.   Etta Quill PA-C [facility] [phone]  02/12/2015, 9:06 AM  I have seen and evaluated the patient. I have reviewed the above note and made appropriate changes. His variance between agitation and sedation is concerning for EtOH withdrawal. Though his level was 150 on arrival, if he typically has a higher level this could be contributing.   I am not sure that  dilantin would be the best choice for long term therapy in hime and would favor changing to keppra. Agree with CIWA  Roland Rack, MD [facility] [phone]  If 7pm- 7am, please page neurology on call as listed in [place].

## 2015-02-12 NOTE — Progress Notes (Signed)
[place] TEAM 1 - Stepdown/ICU TEAM Progress Note  Richard [name] KDX:[id number] DOB: February 02, 1971 DOA: 02/11/2015 PCP: [name], MD  Admit HPI / Brief Narrative: 44 y.o. BM PMHx  seizure disorder, TIA, polysubstance abuse, HTN w/ hx/o malignant HTN  Presenting with encephalopathy, alcohol intoxication, lactic acidosis. Per report, EMS was called for patient for multiple nonspecific complaints and including possible STEMI, hematemesis. Per report, on arrival EMS noted patient to be combative. Noted intermittent episodes of loss of consciousness as well as reports of chest pain back pain and arm pain. Was initially refusing transport. However, patient was able to be transported. EKG of time was concerning for? STEMI. However, EKG was consistent to prior tracings in the past. Patient noted recurrent episodes of encephalopathy, combativeness, loss of consciousness/staring spells concerning for seizure. Hemodynamically stable on presentation. Hemoccult negative. Head CT within normal limits. Notable findings include lactic acidosis with lactate of 3 and bicarbonate of 16. Ethanol level of 159. LFTs within normal limits. UDS pending. Dilantin level pending.  HPI/Subjective: A/O 0 patient sedated secondary to alcohol withdrawal    Assessment/Plan:  Acute Encephalopathy -multifactorial in setting of seizure disorder, polysubstance abuse, and ETOH intoxication -initially highly combative on presentation w/ intermittent staring spells per report  -ammonia; slightly elevated with normal LFTs -no overt sources of infection currently (CXR WNL apart from COPD-UA pending, afebrile, WBC normal) -CIWA protocol for stepdown unit  ETOH intoxication/substance abuse  -Continue normal saline at 175ml/hr  -UDS; positive for cocaine and benzodiazepine -ammonia level; slightly elevated at 45  -Continue CIWA protocol for stepdown unit -Obtain echocardiogram alcoholic cardiomyopathy  Anion gap mixed  Acidosis  -likely secondary to above -salicylate level WNL -Continue normal saline 100 ml/hr  -f/u lactate and trend   Seizure disorder -Normal EEG -dilantin level pending -neuro consult  -DC Dilantin and change to Keppra in a.m. per neurology recommendations    HTN -Increase hydralazine to 20 mg q 4 hr -Labetalol 5 mg q 2hr PRN SBP> and 60 or DBP> 100      Code Status: FULL Family Communication: no family present at time of exam Disposition Plan: Resolution of intoxication    Consultants: None  Procedure/Significant Events: 3/1 EEG; normal   Culture NA  Antibiotics: NA  DVT prophylaxis: Heparin subcutaneous   Devices    LINES / TUBES:      Continuous Infusions: . sodium chloride 100 mL/hr at 02/12/15 0700  . sodium chloride 250 mL (02/12/15 0836)    Objective: VITAL SIGNS: Temp: 98.9 F (37.2 C) (03/01 2048) Temp Source: Axillary (03/01 2048) BP: 186/112 mmHg (03/01 1800) Pulse Rate: 124 (03/01 1800) SPO2; FIO2:   Intake/Output Summary (Last 24 hours) at 02/12/15 2116 Last data filed at 02/12/15 1600  Gross per 24 hour  Intake   3609 ml  Output    900 ml  Net   2709 ml     Exam: General: A/O 0, NAD, No acute respiratory distress Lungs: Clear to auscultation bilaterally without wheezes or crackles Cardiovascular: Tachycardic, Regular rhythm without murmur gallop or rub normal S1 and S2 Abdomen: Nontender, nondistended, soft, bowel sounds positive, no rebound, no ascites, no appreciable mass Extremities: No significant cyanosis, clubbing, or edema bilateral lower extremities  Data Reviewed: Basic Metabolic Panel:  Recent Labs Lab 02/11/15 2043 02/11/15 2109 02/12/15 0210  NA 142 143 144  K 4.4 4.2 4.5  CL 113* 110 112  CO2 16*  --  19  GLUCOSE 88 78 86  BUN 11 12 8  CREATININE 1.10 1.30 1.11  CALCIUM 9.2  --  8.3*   Liver Function Tests:  Recent Labs Lab 02/11/15 2043 02/12/15 0210  AST 30 33  ALT 23 21    ALKPHOS 79 62  BILITOT 0.5 0.7  PROT 7.4 6.0  ALBUMIN 4.0 3.3*    Recent Labs Lab 02/11/15 2043  LIPASE 68*    Recent Labs Lab 02/12/15 0545  AMMONIA 45*   CBC:  Recent Labs Lab 02/11/15 2043 02/11/15 2109 02/12/15 0210  WBC 7.3  --  6.8  NEUTROABS 5.3  --  3.8  HGB 16.7 18.7* 14.6  HCT 48.5 55.0* 43.8  MCV 91.5  --  93.0  PLT 235  --  223   Cardiac Enzymes:  Recent Labs Lab 02/11/15 2043 02/12/15 0210 02/12/15 0545  CKTOTAL 141  --   --   TROPONINI  --  <0.03 <0.03   BNP (last 3 results) No results for input(s): BNP in the last 8760 hours.  ProBNP (last 3 results)  Recent Labs  06/20/14 0558 08/15/14 1244  PROBNP 13.5 36.0    CBG:  Recent Labs Lab 02/11/15 2102  GLUCAP 82    Recent Results (from the past 240 hour(s))  MRSA PCR Screening     Status: None   Collection Time: 02/12/15  1:25 AM  Result Value Ref Range Status   MRSA by PCR NEGATIVE NEGATIVE Final    Comment:        The GeneXpert MRSA Assay (FDA approved for NASAL specimens only), is one component of a comprehensive MRSA colonization surveillance program. It is not intended to diagnose MRSA infection nor to guide or monitor treatment for MRSA infections.      Studies:  Recent x-ray studies have been reviewed in detail by the Attending Physician  Scheduled Meds:  Scheduled Meds: . antiseptic oral rinse  7 mL Mouth Rinse BID  . folic acid  1 mg Oral Daily  . heparin  5,000 Units Subcutaneous 3 times per day  . hydrALAZINE  20 mg Intravenous Q4H  . levETIRAcetam  500 mg Intravenous Q12H  . multivitamin with minerals  1 tablet Oral Daily  . pantoprazole (PROTONIX) IV  40 mg Intravenous Q24H  . sodium chloride  3 mL Intravenous Q12H  . thiamine  100 mg Oral Daily   Or  . thiamine  100 mg Intravenous Daily    Time spent on care of this patient: 40 mins   [name] [facility]  [facility] Office  [phone] Pager - [phone]  On-Call/Text  Page:      [email or web address]      password [email or web address]  If 7PM-7AM, please contact night-coverage [email or web address] Password [email or web address] 02/12/2015, 9:16 PM   LOS: 1 day   Care during the described time interval was provided by me .  I have reviewed this patient's available data, including medical history, events of note, physical examination, radiology studies and test results as part of my evaluation  Dia Crawford, MD [phone] Pager

## 2015-02-12 NOTE — Procedures (Signed)
ELECTROENCEPHALOGRAM REPORT  Patient: Richard [name]       Room #: 8F02  EEG No. ID: [id number] Age: 45 y.o.        Sex: male Referring Physician: Sherral Hammers, C Report Date:  02/12/2015        Interpreting Physician: Anthony Sar  History: Richard [name] is an 44 y.o. male 44 year old man with history of hypertension and seizure disorder as well as ICH, presenting with altered mental status following suspected multiple partial complex seizures. Patient also has a history of alcohol and cocaine abuse. Alcohol level was 159. Urine was positive for benzodiazepines and cocaine. Phenytoin level on 02/11/2015 was undetectable.  Indications for study:  Rule out seizure activity.  Technique: This is an 18 channel routine scalp EEG performed at the bedside with bipolar and monopolar montages arranged in accordance to the international 10/20 system of electrode placement.   Description: This EEG recording was performed during sleep. Patient had received a total of 8 mg of Ativan for seizure control and agitation. Predominant background activity consisted of low amplitude diffuse irregular delta activity with superimposed diffuse sleep spindles were most prominent in the central and frontal regions. Patient was unarousable with noxious stimulation. Photic stimulation was not performed. No epileptiform discharges were recorded.  Interpretation: This is a normal sleep EEG recording. No evidence of ongoing seizure activity was recorded.   Rush Farmer M.D. [facility] [phone]

## 2015-02-12 NOTE — Progress Notes (Signed)
EEG completed; results pending.    

## 2015-02-12 NOTE — Care Management Note (Addendum)
    Page 1 of 2   03/01/2015     2:59:51 PM CARE MANAGEMENT NOTE 03/01/2015  Patient:  Richard [name], Richard [name]   Account Number:  [id number]  Date Initiated:  02/12/2015  Documentation initiated by:  Elissa Hefty  Subjective/Objective Assessment:   adm w encepalopathy     Action/Plan:   lives w fam, pcp dr deepak advani   Anticipated DC Date:     Anticipated DC Plan:  HOME/SELF CARE  In-house referral  Clinical Social Worker      DC Forensic scientist  CM consult  Medication Assistance      Choice offered to / List presented to:  C-1 Patient   DME arranged  [place]      DME agency  [place] arranged  HH-1 RN  [place]      [place].   Status of service:  Completed, signed off Medicare Important Message given?  NO (If response is "NO", the following Medicare IM given date fields will be blank) Date Medicare IM given:   Medicare IM given by:   Date Additional Medicare IM given:   Additional Medicare IM given by:    Discharge Disposition:  [place]  Per UR Regulation:  Reviewed for med. necessity/level of care/duration of stay  If discussed at [place] of Stay Meetings, dates discussed:   02/19/2015  02/21/2015    Comments:  Contact:  Betsy Pries Mother       Jhan, [place] [phone]     Loyd, [place] Sister [phone]  03/01/15 [place] RN MSN BSN CCM Pt has capacity per psych eval, plan is to d/c home with family with home health following.  Pt has Medicaid, will not qualify for PT/OT, agency will try to provide PT eval and can teach pt exercises.  Pt started on Xarelto which is on Medicaid preferred list, provided pt with 30-day free trial card.  02/26/15 0900 Vaeda Westall RN MSN BSN CCM Pt disoriented and agitated @ times, requiring sitter @ bedside.  Mother in room with pt but is unable to manage him by herself.  02-21-15  2:15pm [place] [phone] ETOH withdrawal - on precedex drip - lateral tx from [facility] ICU.  3/7 0926 debbie dowell rn,bsn left pt 30day free brilinta card. pt has medicaid so copay should be around 3.00 per month.

## 2015-02-12 NOTE — Progress Notes (Signed)
eLink Physician-Brief Progress Note Patient Name: Richard [name] DOB: [birth year] MRN: [id number]   Date of Service  02/12/2015  HPI/Events of Note  Call from bedside nurse reporting that patient is agitated, OOB.  Requesting assistance.  Patient just admitted from ED with concern for ETOH withdrawal.  Camera check shows patient standing by bed, 2 nurses at bedside.  Report patient has received 8 mg of ativan in ED.  Does not appear combative at present but nurse states patient has been combative.  QTc on EKG is less than 500.  eICU Interventions  Plan: Nurse to contact primary team stat Haldol 5 mg IV times one. Will continue to monitor     Intervention Category Major Interventions: Delirium, psychosis, severe agitation - evaluation and management  Essica Kiker 02/12/2015, 12:13 AM

## 2015-02-12 NOTE — Progress Notes (Signed)
Pt BP still 170's/100's.  Contacted Dr Sherral Hammers regarding additional orders for BP control.  See medication orders.  Pt asleep currently wit BP 179/102.  Additional 10mg  hydralizing given per order.  Carol Ada, RN

## 2015-02-13 ENCOUNTER — Inpatient Hospital Stay (HOSPITAL_COMMUNITY): Payer: Medicare Other

## 2015-02-13 DIAGNOSIS — J9610 Chronic respiratory failure, unspecified whether with hypoxia or hypercapnia: Secondary | ICD-10-CM | POA: Diagnosis present

## 2015-02-13 DIAGNOSIS — J9621 Acute and chronic respiratory failure with hypoxia: Secondary | ICD-10-CM | POA: Diagnosis present

## 2015-02-13 DIAGNOSIS — J9601 Acute respiratory failure with hypoxia: Secondary | ICD-10-CM | POA: Diagnosis present

## 2015-02-13 DIAGNOSIS — G9340 Encephalopathy, unspecified: Secondary | ICD-10-CM

## 2015-02-13 DIAGNOSIS — J9600 Acute respiratory failure, unspecified whether with hypoxia or hypercapnia: Secondary | ICD-10-CM

## 2015-02-13 LAB — COMPREHENSIVE METABOLIC PANEL
ALT: 33 U/L (ref 0–53)
AST: 39 U/L — ABNORMAL HIGH (ref 0–37)
Albumin: 3.6 g/dL (ref 3.5–5.2)
Alkaline Phosphatase: 86 U/L (ref 39–117)
Anion gap: 11 (ref 5–15)
BUN: 5 mg/dL — AB (ref 6–23)
CALCIUM: 8.8 mg/dL (ref 8.4–10.5)
CO2: 18 mmol/L — AB (ref 19–32)
CREATININE: 1.08 mg/dL (ref 0.50–1.35)
Chloride: 110 mmol/L (ref 96–112)
GFR calc non Af Amer: 82 mL/min — ABNORMAL LOW (ref >90)
Glucose, Bld: 105 mg/dL — ABNORMAL HIGH (ref 70–99)
Potassium: 4 mmol/L (ref 3.5–5.1)
Sodium: 139 mmol/L (ref 135–145)
Total Bilirubin: 1.7 mg/dL — ABNORMAL HIGH (ref 0.3–1.2)
Total Protein: 7.1 g/dL (ref 6.0–8.3)

## 2015-02-13 LAB — CBC WITH DIFFERENTIAL/PLATELET
BASOS ABS: 0 10*3/uL (ref 0.0–0.1)
Basophils Relative: 0 % (ref 0–1)
EOS ABS: 0 10*3/uL (ref 0.0–0.7)
EOS PCT: 0 % (ref 0–5)
HCT: 50.5 % (ref 39.0–52.0)
Hemoglobin: 16.9 g/dL (ref 13.0–17.0)
Lymphocytes Relative: 10 % — ABNORMAL LOW (ref 12–46)
Lymphs Abs: 1.1 10*3/uL (ref 0.7–4.0)
MCH: 31.9 pg (ref 26.0–34.0)
MCHC: 33.5 g/dL (ref 30.0–36.0)
MCV: 95.5 fL (ref 78.0–100.0)
Monocytes Absolute: 1.3 10*3/uL — ABNORMAL HIGH (ref 0.1–1.0)
Monocytes Relative: 11 % (ref 3–12)
Neutro Abs: 8.6 10*3/uL — ABNORMAL HIGH (ref 1.7–7.7)
Neutrophils Relative %: 79 % — ABNORMAL HIGH (ref 43–77)
PLATELETS: 253 10*3/uL (ref 150–400)
RBC: 5.29 MIL/uL (ref 4.22–5.81)
RDW: 14.7 % (ref 11.5–15.5)
WBC: 10.9 10*3/uL — ABNORMAL HIGH (ref 4.0–10.5)

## 2015-02-13 LAB — POCT I-STAT 3, ART BLOOD GAS (G3+)
Acid-base deficit: 4 mmol/L — ABNORMAL HIGH (ref 0.0–2.0)
Bicarbonate: 21.3 mEq/L (ref 20.0–24.0)
O2 Saturation: 100 %
PH ART: 7.327 — AB (ref 7.350–7.450)
TCO2: 22 mmol/L (ref 0–100)
pCO2 arterial: 41 mmHg (ref 35.0–45.0)
pO2, Arterial: 212 mmHg — ABNORMAL HIGH (ref 80.0–100.0)

## 2015-02-13 LAB — BLOOD GAS, ARTERIAL
Acid-base deficit: 2.3 mmol/L — ABNORMAL HIGH (ref 0.0–2.0)
Bicarbonate: 21.5 mEq/L (ref 20.0–24.0)
DRAWN BY: 35849
FIO2: 100 %
O2 Saturation: 98.5 %
PATIENT TEMPERATURE: 98.6
PH ART: 7.422 (ref 7.350–7.450)
TCO2: 22.5 mmol/L (ref 0–100)
pCO2 arterial: 33.5 mmHg — ABNORMAL LOW (ref 35.0–45.0)
pO2, Arterial: 130 mmHg — ABNORMAL HIGH (ref 80.0–100.0)

## 2015-02-13 LAB — LACTIC ACID, PLASMA: LACTIC ACID, VENOUS: 1.7 mmol/L (ref 0.5–2.0)

## 2015-02-13 LAB — MAGNESIUM: MAGNESIUM: 1.7 mg/dL (ref 1.5–2.5)

## 2015-02-13 LAB — PROCALCITONIN

## 2015-02-13 MED ORDER — LORAZEPAM 2 MG/ML IJ SOLN
2.0000 mg | INTRAMUSCULAR | Status: DC | PRN
  Administered 2015-02-13 (×4): 3 mg via INTRAVENOUS
  Administered 2015-02-14 – 2015-02-15 (×2): 2 mg via INTRAVENOUS
  Administered 2015-02-15: 3 mg via INTRAVENOUS
  Administered 2015-02-15: 2 mg via INTRAVENOUS
  Filled 2015-02-13: qty 2
  Filled 2015-02-13 (×3): qty 1
  Filled 2015-02-13 (×4): qty 2

## 2015-02-13 MED ORDER — DEXMEDETOMIDINE HCL IN NACL 200 MCG/50ML IV SOLN
0.4000 ug/kg/h | INTRAVENOUS | Status: DC
  Administered 2015-02-13 (×2): 0.4 ug/kg/h via INTRAVENOUS
  Administered 2015-02-14: 0.6 ug/kg/h via INTRAVENOUS
  Administered 2015-02-14: 0.4 ug/kg/h via INTRAVENOUS
  Filled 2015-02-13 (×6): qty 50

## 2015-02-13 MED ORDER — HALOPERIDOL LACTATE 5 MG/ML IJ SOLN
5.0000 mg | Freq: Once | INTRAMUSCULAR | Status: AC
  Administered 2015-02-13: 5 mg via INTRAVENOUS

## 2015-02-13 MED ORDER — LORAZEPAM 2 MG/ML IJ SOLN
4.0000 mg | Freq: Once | INTRAMUSCULAR | Status: AC
  Administered 2015-02-13: 4 mg via INTRAVENOUS

## 2015-02-13 MED ORDER — LORAZEPAM 2 MG/ML IJ SOLN
INTRAMUSCULAR | Status: AC
  Administered 2015-02-13: 4 mg via INTRAVENOUS
  Filled 2015-02-13: qty 2

## 2015-02-13 MED ORDER — LORAZEPAM 2 MG/ML IJ SOLN
INTRAMUSCULAR | Status: AC
  Administered 2015-02-13: 2 mg via INTRAVENOUS
  Filled 2015-02-13: qty 1

## 2015-02-13 MED ORDER — LORAZEPAM 2 MG/ML IJ SOLN
1.0000 mg | Freq: Two times a day (BID) | INTRAMUSCULAR | Status: DC
  Administered 2015-02-13 – 2015-02-17 (×8): 1 mg via INTRAVENOUS
  Filled 2015-02-13 (×8): qty 1

## 2015-02-13 MED ORDER — LORAZEPAM 2 MG/ML IJ SOLN
2.0000 mg | Freq: Once | INTRAMUSCULAR | Status: AC
  Administered 2015-02-13: 2 mg via INTRAVENOUS
  Filled 2015-02-13: qty 1

## 2015-02-13 MED ORDER — LORAZEPAM 2 MG/ML IJ SOLN: 1.0000 mg | Freq: Two times a day (BID) | INTRAMUSCULAR | Status: DC

## 2015-02-13 MED ORDER — LORAZEPAM 2 MG/ML IJ SOLN
2.0000 mg | Freq: Once | INTRAMUSCULAR | Status: AC
  Administered 2015-02-13: 2 mg via INTRAVENOUS

## 2015-02-13 MED ORDER — DEXTROSE-NACL 5-0.45 % IV SOLN
INTRAVENOUS | Status: DC
  Administered 2015-02-13 – 2015-02-14 (×2): via INTRAVENOUS

## 2015-02-13 MED ORDER — MAGNESIUM SULFATE 2 GM/50ML IV SOLN
2.0000 g | Freq: Once | INTRAVENOUS | Status: AC
  Administered 2015-02-13: 2 g via INTRAVENOUS
  Filled 2015-02-13: qty 50

## 2015-02-13 NOTE — Progress Notes (Addendum)
Brief Progress  44 yo AA male with PMH of seizure disorder, TIA, polysubstance abuse, malignant HTN that presented to the ED with encephalopathy, alcohol intoxication, and episodes loss of consciousness. In ED, CT head with no findings, labs with evidence of lactic acid of 3, bicarb of 16, and Ethanol of 159. Patient is placed on CIWA protocol, and admitted for further management. Neurology was consulted, and EEG obtained with no evidence of seizure activity, and recommendation is to change antiepileptic Dilantin to Keppra.  03/01-03/02- pt with continued agitation and confusion despite Valium and IV Haldol,  with CIWA >30 persistently. Labs with evidence of leukocytosis, T 100.3, HR 148, RR 40, BP also remains elevated at 203/118. Labs questionable for stress response from alcohol withdrawal vs sepsis with possible source of aspiration pneumonia. At bedside, pt is sedated, on NRB mask, on 4 point restraints, working to breath in moderate respiratory distress. Lungs- CTAB. Heart- Tachycardic, with no m, r, g. Abd- +BS, NTND. Extremities- on 4 point restraints, on edema.  PCCM consulted, patient will be moved to ICU for Precedex gtt and possible intubation.   Lacy Duverney PA-C [facility]

## 2015-02-13 NOTE — Progress Notes (Signed)
Pt is obtunded no Ativan given at this time.

## 2015-02-13 NOTE — Progress Notes (Signed)
[place] Progress Note Patient Name: Bland Rudzinski DOB: 04-14-1971 MRN: [id number]   Date of Service  02/13/2015  HPI/Events of Note  ABG on 100% NRBM = 7.42/33.5/130/21.5  eICU Interventions  Ventilation acceptable. Continue present management.     Intervention Category Intermediate Interventions: Diagnostic test evaluation  Javonda Suh Eugene 02/13/2015, 7:17 PM

## 2015-02-13 NOTE — Progress Notes (Signed)
eLink Physician-Brief Progress Note Patient Name: Richard [name] DOB: May 05, 1971 MRN: [id number]   Date of Service  02/13/2015  HPI/Events of Note  Called d/t patient obtundation. Precedex IV infusion has been off since 3 PM. Patient is on 100% NRBM and does not respond to stimuli. Question if he is now hypercarbic.   eICU Interventions  Will order ABG now. Bedside nurse to call result.         [name] 02/13/2015, 6:30 PM

## 2015-02-13 NOTE — Progress Notes (Signed)
eLink Physician-Brief Progress Note Patient Name: Richard [name] DOB: 1971-11-12 MRN: [id number]   Date of Service  02/13/2015  HPI/Events of Note  Called to renew renew order for restraints.   eICU Interventions  Restraint orders renewed. Would ask bedside team to re-evaluate the continued need for restraints in AM.      Intervention Category Minor Interventions: Routine modifications to care plan (e.g. PRN medications for pain, fever)  [name],[name] 02/13/2015, 10:15 PM

## 2015-02-13 NOTE — Progress Notes (Signed)
Paged [place] about HR 150s, RR 30-40, BP 190/112 CIWA 34 Previous ativan, haldol and valium doses ineffective (see MAR for doses given). Was told pt not a candidate for precedex drip. Revised CIWA protocol orders received. Will continue to monitor closely.

## 2015-02-13 NOTE — Progress Notes (Signed)
Subjective: patient very somnolent and only responds to noxious stimuli.   Objective: Current vital signs: BP 203/118 mmHg  Pulse 148  Temp(Src) 100.3 F (37.9 C) (Oral)  Resp 24  Ht 6' (1.829 m)  Wt 98 kg (216 lb 0.8 oz)  BMI 29.30 kg/m2  SpO2 94% Vital signs in last 24 hours: Temp:  [97.6 F (36.4 C)-100.3 F (37.9 C)] 100.3 F (37.9 C) (03/02 0800) Pulse Rate:  [94-153] 148 (03/02 0800) Resp:  [18-40] 24 (03/02 0800) BP: (151-203)/(74-150) 203/118 mmHg (03/02 0800) SpO2:  [91 %-97 %] 94 % (03/02 0800) FiO2 (%):  [100 %] 100 % (03/02 0400) Weight:  [98 kg (216 lb 0.8 oz)] 98 kg (216 lb 0.8 oz) (03/02 0352)  Intake/Output from previous day: 03/01 0701 - 03/02 0700 In: 2364 [I.V.:2114; IV Piggyback:250] Out: 1600 [Urine:1600] Intake/Output this shift: Total I/O In: 400 [I.V.:400] Out: -  Nutritional status: Diet NPO time specified  Neurologic Exam: General: Mental Status: Extremely somnolent only responding to noxious stimuli. . Cranial Nerves: II: closes eyes to direct light placed in her eyes, pupils equal, round, reactive to light and accommodation III,IV, VI: oculocephalic reflex intact V,VII: face symmetric, facial light touch sensation normal bilaterally VIII: hearing normal bilaterally IX,X: gag reflex present XI: bilateral shoulder shrug XII: midline tongue extension without atrophy or fasciculations  Motor: Withdraws to noxious stimuli only and to that he only withdraws arms and legs but does not lift off bed.  Sensory: to noxious stimuli only Deep Tendon Reflexes:  Right: Upper Extremity   Left: Upper extremity   biceps (C-5 to C-6) 2/4   biceps (C-5 to C-6) 2/4 tricep (C7) 2/4    triceps (C7) 2/4 Brachioradialis (C6) 2/4  Brachioradialis (C6) 2/4  Lower Extremity Lower Extremity  quadriceps (L-2 to L-4) 2/4   quadriceps (L-2 to L-4) 2/4 Achilles (S1) 2/4   Achilles (S1) 2/4  Plantars: Right: downgoing   Left: downgoing    Lab  Results: Basic Metabolic Panel:  Recent Labs Lab 02/11/15 2043 02/11/15 2109 02/12/15 0210 02/13/15 0304  NA 142 143 144 139  K 4.4 4.2 4.5 4.0  CL 113* 110 112 110  CO2 16*  --  19 18*  GLUCOSE 88 78 86 105*  BUN 11 12 8  5*  CREATININE 1.10 1.30 1.11 1.08  CALCIUM 9.2  --  8.3* 8.8  MG  --   --   --  1.7    Liver Function Tests:  Recent Labs Lab 02/11/15 2043 02/12/15 0210 02/13/15 0304  AST 30 33 39*  ALT 23 21 33  ALKPHOS 79 62 86  BILITOT 0.5 0.7 1.7*  PROT 7.4 6.0 7.1  ALBUMIN 4.0 3.3* 3.6    Recent Labs Lab 02/11/15 2043  LIPASE 68*    Recent Labs Lab 02/12/15 0545  AMMONIA 45*    CBC:  Recent Labs Lab 02/11/15 2043 02/11/15 2109 02/12/15 0210 02/13/15 0304  WBC 7.3  --  6.8 10.9*  NEUTROABS 5.3  --  3.8 8.6*  HGB 16.7 18.7* 14.6 16.9  HCT 48.5 55.0* 43.8 50.5  MCV 91.5  --  93.0 95.5  PLT 235  --  223 253    Cardiac Enzymes:  Recent Labs Lab 02/11/15 2043 02/12/15 0210 02/12/15 0545  CKTOTAL 141  --   --   TROPONINI  --  <0.03 <0.03    Lipid Panel: No results for input(s): CHOL, TRIG, HDL, CHOLHDL, VLDL, LDLCALC in the last 168 hours.  CBG:  Recent Labs Lab 02/11/15 2102  GLUCAP 82    Microbiology: Results for orders placed or performed during the hospital encounter of 02/11/15  MRSA PCR Screening     Status: None   Collection Time: 02/12/15  1:25 AM  Result Value Ref Range Status   MRSA by PCR NEGATIVE NEGATIVE Final    Comment:        The GeneXpert MRSA Assay (FDA approved for NASAL specimens only), is one component of a comprehensive MRSA colonization surveillance program. It is not intended to diagnose MRSA infection nor to guide or monitor treatment for MRSA infections.     Coagulation Studies:  Recent Labs  02/11/15 2043  LABPROT 13.3  INR 1.00    Imaging: Ct Head Wo Contrast  02/11/2015   CLINICAL DATA:  Altered mental status  EXAM: CT HEAD WITHOUT CONTRAST  TECHNIQUE: Contiguous axial  images were obtained from the base of the skull through the vertex without intravenous contrast.  COMPARISON:  08/15/2014  FINDINGS: Left frontal encephalomalacia reidentified. Diffuse mild cortical volume loss with proportional ventricular prominence reidentified. Areas of small vessel ischemic change manifesting as white matter hypodensity are reidentified. No acute hemorrhage, infarct, or mass lesion is identified. Chronic left maxillary sinusitis reidentified. No acute osseous abnormality.  IMPRESSION: No acute intracranial abnormality. Left frontal encephalomalacia reidentified.  Chronic left maxillary sinusitis.   Electronically Signed   By: Conchita Paris M.D.   On: 02/11/2015 21:49   Dg Chest Port 1 View  02/13/2015   CLINICAL DATA:  Acute respiratory failure, hypoxemia.  EXAM: PORTABLE CHEST - 1 VIEW  COMPARISON:  02/11/2015  FINDINGS: Heart and mediastinal contours are within normal limits. There is peribronchial thickening and interstitial prominence, similar to prior study, likely chronic bronchitic changes. Increasing opacity at the left base, atelectasis versus infiltrate. No confluent opacity on the right. No effusions. No acute bony abnormality.  IMPRESSION: Chronic bronchitic changes.  Left basilar atelectasis or developing infiltrate.   Electronically Signed   By: Rolm Baptise M.D.   On: 02/13/2015 09:27   Dg Chest Portable 1 View  02/11/2015   CLINICAL DATA:  Initial evaluation for acute chest pain, emesis.  EXAM: PORTABLE CHEST - 1 VIEW  COMPARISON:  Prior radiograph from 12/15/2014  FINDINGS: Transverse heart size within normal limits on this AP projection. Mediastinal silhouette within normal limits.  Lungs are normally inflated. Changes compatible with COPD/ emphysema again seen. No focal infiltrate, pulmonary edema, or pleural effusion. No pneumothorax.  No acute osseus abnormality.  IMPRESSION: COPD/emphysema. No superimposed active cardiopulmonary disease identified.   Electronically  Signed   By: Jeannine Boga M.D.   On: 02/11/2015 21:06    Medications:  Scheduled: . antiseptic oral rinse  7 mL Mouth Rinse BID  . folic acid  1 mg Oral Daily  . heparin  5,000 Units Subcutaneous 3 times per day  . hydrALAZINE  20 mg Intravenous Q4H  . levETIRAcetam  500 mg Intravenous Q12H  . magnesium sulfate 1 - 4 g bolus IVPB  2 g Intravenous Once  . multivitamin with minerals  1 tablet Oral Daily  . pantoprazole (PROTONIX) IV  40 mg Intravenous Q24H  . sodium chloride  3 mL Intravenous Q12H  . thiamine  100 mg Oral Daily   Or  . thiamine  100 mg Intravenous Daily   Continuous: . sodium chloride 250 mL (02/12/15 0836)  . dexmedetomidine 0.4 mcg/kg/hr (02/13/15 0844)  . dextrose 5 % and 0.45% NaCl 75 mL/hr at 02/13/15  0944   YTW:KMQKMM chloride, haloperidol lactate, labetalol, LORazepam   EEG on 02/12/2015 Interpretation: This is a normal sleep EEG recording. No evidence of ongoing seizure activity was recorded.  Assessment/Plan: 43y/o gentleman with history of seizures, TBI, EtOH and cocaine abuse presenting with breakthrough seizures. Currently he has received a total of 12 mg Ativan since  0300 and 0630 this AM due to agitation and started on Precedex. Currently he is very somnolent only responding to pain.    Etta Quill PA-C [facility] [phone]  02/13/2015, 10:31 AM  I have seen and evaluated the patient. I have reviewed the above note and made appropriate changes.   I agree with managing him as withdrawal, but I would favor continuing at least some scheduled benzodiazepine for protection from seizures in addition to the precedex.   Roland Rack, MD [facility] [phone]  If 7pm- 7am, please page neurology on call as listed in [place].

## 2015-02-13 NOTE — Progress Notes (Signed)
Pt is obtunded. No ativan given at this time.

## 2015-02-13 NOTE — Progress Notes (Signed)
  Echocardiogram 2D Echocardiogram has been performed.  Asaiah Hunnicutt FRANCES 02/13/2015, 11:25 AM

## 2015-02-13 NOTE — Consult Note (Signed)
PULMONARY / CRITICAL CARE MEDICINE   Name: Richard [name] MRN: [id number] DOB: [birth year]    ADMISSION DATE:  02/11/2015 CONSULTATION DATE:  02/13/2015   REFERRING MD :  Dr Chilton Greathouse of [facility]  CHIEF COMPLAINT:  Severe delirium, acute resp failure  SIGNIFICANT EVENTS: 02/11/2015 - admit, UDS poitive for cocaine, bnezoe, ETOH 150s, NHM3 45 02/13/2015 - PCCM consult   HISTORY OF PRESENT ILLNESS:   Richard [name] is a 44 y.o. male with hx of substance abuse, hx of hypertension, hx of tia nos, hx of seizure disoreder nos. Presented 02/11/2015 with acute agitated encephalopathy and associated acute etoh intoxication (159)  and lactic acidosis (bic 16, lactate 3, LFT normal). Seen by neurology 3/11/6 for breaktrhiough seizures, aigtation and despite significant doses of ativan. Started on Keppra. EEG late on 02/12/15 showed only normal sleep pattern. Overnight significant agitation RASS +4 needing signifcant ativan, restraints and on AM of 02/13/15 [facility] MD found patient also hypoxemic on NRB and with significant tachypnea x 12 hours along with agitation and PCCM MD assuming primary care and moving from SDU to ICU    PAST MEDICAL HISTORY :   has a past medical history of Hypertension; Seizures; Asthma; and Brain bleed.  has past surgical history that includes Leg Surgery. Prior to Admission medications   Medication Sig Start Date End Date Taking? Authorizing Provider  amLODipine (NORVASC) 10 MG tablet Take 1 tablet (10 mg total) by mouth daily. 03/20/14   Charlynne Cousins, MD  atenolol (TENORMIN) 50 MG tablet Take 1 tablet (50 mg total) by mouth daily. 03/20/14   Charlynne Cousins, MD  cloNIDine (CATAPRES) 0.1 MG tablet Take 0.1 mg by mouth 2 (two) times daily. 11/23/14   Historical Provider, MD  hydrALAZINE (APRESOLINE) 50 MG tablet Take 1 tablet (50 mg total) by mouth every 8 (eight) hours. 03/20/14   Charlynne Cousins, MD  hydrochlorothiazide (HYDRODIURIL) 12.5 MG tablet Take 1 tablet (12.5 mg total) by  mouth daily. 03/20/14   Charlynne Cousins, MD  omeprazole (PRILOSEC) 40 MG capsule Take 40 mg by mouth daily. 01/22/15   Historical Provider, MD  phenytoin (DILANTIN) 100 MG ER capsule Take 300 mg by mouth daily.  11/23/14   Historical Provider, MD  phenytoin (DILANTIN) 50 MG tablet Chew 100 mg by mouth 2 (two) times daily.    Historical Provider, MD   Allergies  Allergen Reactions  . Dilaudid [Hydromorphone Hcl] Other (See Comments)    Bradycardia and Nausea    FAMILY HISTORY:  indicated that his mother is alive. He indicated that his father is deceased.  SOCIAL HISTORY:  reports that he has been smoking Cigarettes.  He has a 26 pack-year smoking history. He has never used smokeless tobacco. He reports that he drinks about 2.4 oz of alcohol per week. He reports that he uses illicit drugs (Cocaine).  REVIEW OF SYSTEMS:    11 point ROS not possible due to acute encephalopathy in patient  SUBJECTIVE:   VITAL SIGNS: Temp:  [97.6 F (36.4 C)-100 F (37.8 C)] 100 F (37.8 C) (03/02 0352) Pulse Rate:  [90-153] 148 (03/02 0800) Resp:  [18-40] 24 (03/02 0800) BP: (151-203)/(74-150) 203/118 mmHg (03/02 0800) SpO2:  [91 %-98 %] 94 % (03/02 0800) FiO2 (%):  [80 %-100 %] 100 % (03/02 0400) Weight:  [98 kg (216 lb 0.8 oz)] 98 kg (216 lb 0.8 oz) (03/02 0352) HEMODYNAMICS:   VENTILATOR SETTINGS: Vent Mode:  [-]  FiO2 (%):  [80 %-100 %] 100 %  INTAKE / OUTPUT:  Intake/Output Summary (Last 24 hours) at 02/13/15 0814 Last data filed at 02/13/15 0400  Gross per 24 hour  Intake   2164 ml  Output   1600 ml  Net    564 ml    PHYSICAL EXAMINATION: General:  STrong male, in bed, in restraints, face mask o2 on. Looks unwell Neuro:  Eyes closes, restless, perioidically yells, sitter at bedside, does not follow commands HEENT:  Face mask o2 on, neck supple Cardiovascular:  Tachycardic, Normal heart sounds Lungs:  Tachypneic RR 35, Not paradoxical, CTA bilaterally Abdomen:  Soft, no mass,  normal bowel sounds Musculoskeletal:  No cyanosis, No clubbing, No edema Skin:  Intact anteriorly  LABS:  PULMONARY  Recent Labs Lab 02/11/15 2106 02/11/15 2109 02/12/15 0403  PHART 7.278*  --  7.259*  PCO2ART 41.4  --  47.2*  PO2ART 75.0*  --  48.0*  HCO3 19.3*  --  20.4  TCO2 21 16 21.9  O2SAT 93.0  --  75.3    CBC  Recent Labs Lab 02/11/15 2043 02/11/15 2109 02/12/15 0210 02/13/15 0304  HGB 16.7 18.7* 14.6 16.9  HCT 48.5 55.0* 43.8 50.5  WBC 7.3  --  6.8 10.9*  PLT 235  --  223 253    COAGULATION  Recent Labs Lab 02/11/15 2043  INR 1.00    CARDIAC   Recent Labs Lab 02/12/15 0210 02/12/15 0545  TROPONINI <0.03 <0.03   No results for input(s): PROBNP in the last 168 hours.   CHEMISTRY  Recent Labs Lab 02/11/15 2043 02/11/15 2109 02/12/15 0210 02/13/15 0304  NA 142 143 144 139  K 4.4 4.2 4.5 4.0  CL 113* 110 112 110  CO2 16*  --  19 18*  GLUCOSE 88 78 86 105*  BUN 11 12 8  5*  CREATININE 1.10 1.30 1.11 1.08  CALCIUM 9.2  --  8.3* 8.8  MG  --   --   --  1.7   Estimated Creatinine Clearance: 105.9 mL/min (by C-G formula based on Cr of 1.08).   LIVER  Recent Labs Lab 02/11/15 2043 02/12/15 0210 02/13/15 0304  AST 30 33 39*  ALT 23 21 33  ALKPHOS 79 62 86  BILITOT 0.5 0.7 1.7*  PROT 7.4 6.0 7.1  ALBUMIN 4.0 3.3* 3.6  INR 1.00  --   --      INFECTIOUS  Recent Labs Lab 02/11/15 2112 02/12/15 0210 02/13/15 0304  LATICACIDVEN 3.00* 1.7 1.7     ENDOCRINE CBG (last 3)   Recent Labs  02/11/15 2102  GLUCAP 82         IMAGING x48h Ct Head Wo Contrast  02/11/2015   CLINICAL DATA:  Altered mental status  EXAM: CT HEAD WITHOUT CONTRAST  TECHNIQUE: Contiguous axial images were obtained from the base of the skull through the vertex without intravenous contrast.  COMPARISON:  08/15/2014  FINDINGS: Left frontal encephalomalacia reidentified. Diffuse mild cortical volume loss with proportional ventricular prominence  reidentified. Areas of small vessel ischemic change manifesting as white matter hypodensity are reidentified. No acute hemorrhage, infarct, or mass lesion is identified. Chronic left maxillary sinusitis reidentified. No acute osseous abnormality.  IMPRESSION: No acute intracranial abnormality. Left frontal encephalomalacia reidentified.  Chronic left maxillary sinusitis.   Electronically Signed   By: Conchita Paris M.D.   On: 02/11/2015 21:49   Dg Chest Portable 1 View  02/11/2015   CLINICAL DATA:  Initial evaluation for acute chest pain, emesis.  EXAM: PORTABLE CHEST -  1 VIEW  COMPARISON:  Prior radiograph from 12/15/2014  FINDINGS: Transverse heart size within normal limits on this AP projection. Mediastinal silhouette within normal limits.  Lungs are normally inflated. Changes compatible with COPD/ emphysema again seen. No focal infiltrate, pulmonary edema, or pleural effusion. No pneumothorax.  No acute osseus abnormality.  IMPRESSION: COPD/emphysema. No superimposed active cardiopulmonary disease identified.   Electronically Signed   By: Jeannine Boga M.D.   On: 02/11/2015 21:06        ASSESSMENT / PLAN:  PULMONARY OETT - at risk  A:Acute hypoxemic failure since evening of 02/12/15 ass with acute encephalopathy AT risk for intubation  P:   Face mask o2 Control resp drive through control of encephalopathy Intubate if fails  CARDIOVASCULAR  A: at risk for cadiomyopathy - normal echo oct 2015 P:  Repeat echo pending  RENAL A:  Low mag P:   Replete monitor  GASTROINTESTINAL A:  At risk for aspiration P:   NPO HOB elevated  HEMATOLOGIC A:  At risk for anemia of critical illness P:  - PRBC for hgb </= 6.9gm%    - exceptions are   -  if ACS susepcted/confirmed then transfuse for hgb </= 8.0gm%,  or    -  \ active bleeding with hemodynamic instability, then transfuse regardless of hemoglobin value   At at all times try to transfuse 1 unit prbc as possible with  exception of active hemorrhage    INFECTIOUS A:  At risk for sepsis P:   Check biomarkers Monitor off abx  ENDOCRINE A:  Nil acute   P:   monitor  NEUROLOGIC A:  Acute etoh related agitated encephalopathy P:   Ciwa protocol to contrinue Keppra per neurology Start precedex  FAMILY  - Updates:  None at bedside  - Inter-disciplinary family meet or Palliative Care meeting due by: 02/19/15      The patient is critically ill with multiple organ systems failure and requires high complexity decision making for assessment and support, frequent evaluation and titration of therapies, application of advanced monitoring technologies and extensive interpretation of multiple databases.   Critical Care Time devoted to patient care services described in this note is  60  Minutes. This time reflects time of care of this signee Dr Brand Males. This critical care time does not reflect procedure time, or teaching time or supervisory time of PA/NP/Med student/Med Resident etc but could involve care discussion time    Dr. Brand Males, M.D., [facility].C.P Pulmonary and Critical Care Medicine Staff Physician [facility] Pager: [phone], If no answer or between  15:00h - 7:00h: call [phone]  02/13/2015 8:50 AM

## 2015-02-13 NOTE — Progress Notes (Signed)
Shift event:  Notified multiple times this evening regarding pt's persistent agitation/confusion. Valium (ETOH) withdrawal protocol with additional IV Haldol has proven either minimally effective or not effective at all. Pt's CIWA scores have remained very high (>30) persistently. Tachycardia and HTN have persisted, HR currently in 150's and BP's have at times reached 190/112. Additional IV Ativan has been added ( 8 mg IV over last 1.5-2 hrs) with very little improvement. At bedside pt noted w/ 4-point restraints and continues to fight restraints and yell out at intervals. Discussed pt w/ Dr Nelda Marseille w/ [facility] regarding possibility of pt being a candidate for precedex drip. He has recommended changing from Valium back to Ativan protocol. If this continues to be ineffective we are to notify [facility] to discuss moving pt to ICU for Precedex qtt. Will continue to monitor closely in SDU.  Jeryl Columbia, NP-C [facility] Pager [phone]

## 2015-02-14 ENCOUNTER — Inpatient Hospital Stay (HOSPITAL_COMMUNITY): Payer: Medicare Other

## 2015-02-14 DIAGNOSIS — F10931 Alcohol use, unspecified with withdrawal delirium: Secondary | ICD-10-CM | POA: Diagnosis present

## 2015-02-14 DIAGNOSIS — F10231 Alcohol dependence with withdrawal delirium: Principal | ICD-10-CM

## 2015-02-14 LAB — CBC WITH DIFFERENTIAL/PLATELET
Basophils Absolute: 0 10*3/uL (ref 0.0–0.1)
Basophils Relative: 0 % (ref 0–1)
Eosinophils Absolute: 0 10*3/uL (ref 0.0–0.7)
Eosinophils Relative: 0 % (ref 0–5)
HCT: 48.3 % (ref 39.0–52.0)
HEMOGLOBIN: 16.3 g/dL (ref 13.0–17.0)
LYMPHS ABS: 1.1 10*3/uL (ref 0.7–4.0)
LYMPHS PCT: 10 % — AB (ref 12–46)
MCH: 31.6 pg (ref 26.0–34.0)
MCHC: 33.7 g/dL (ref 30.0–36.0)
MCV: 93.6 fL (ref 78.0–100.0)
MONOS PCT: 11 % (ref 3–12)
Monocytes Absolute: 1.3 10*3/uL — ABNORMAL HIGH (ref 0.1–1.0)
NEUTROS PCT: 79 % — AB (ref 43–77)
Neutro Abs: 8.6 10*3/uL — ABNORMAL HIGH (ref 1.7–7.7)
Platelets: 213 10*3/uL (ref 150–400)
RBC: 5.16 MIL/uL (ref 4.22–5.81)
RDW: 14.5 % (ref 11.5–15.5)
WBC: 11 10*3/uL — AB (ref 4.0–10.5)

## 2015-02-14 LAB — COMPREHENSIVE METABOLIC PANEL
ALBUMIN: 3.1 g/dL — AB (ref 3.5–5.2)
ALT: 31 U/L (ref 0–53)
AST: 30 U/L (ref 0–37)
Alkaline Phosphatase: 71 U/L (ref 39–117)
Anion gap: 8 (ref 5–15)
BILIRUBIN TOTAL: 1.2 mg/dL (ref 0.3–1.2)
BUN: <5 mg/dL — ABNORMAL LOW (ref 6–23)
CO2: 23 mmol/L (ref 19–32)
CREATININE: 0.89 mg/dL (ref 0.50–1.35)
Calcium: 8.6 mg/dL (ref 8.4–10.5)
Chloride: 107 mmol/L (ref 96–112)
GFR calc Af Amer: >90 mL/min (ref >90)
GFR calc non Af Amer: >90 mL/min (ref >90)
Glucose, Bld: 127 mg/dL — ABNORMAL HIGH (ref 70–99)
Potassium: 3.9 mmol/L (ref 3.5–5.1)
Sodium: 138 mmol/L (ref 135–145)
Total Protein: 6.3 g/dL (ref 6.0–8.3)

## 2015-02-14 LAB — MAGNESIUM: Magnesium: 1.8 mg/dL (ref 1.5–2.5)

## 2015-02-14 LAB — PHOSPHORUS: Phosphorus: 2 mg/dL — ABNORMAL LOW (ref 2.3–4.6)

## 2015-02-14 LAB — GLUCOSE, CAPILLARY: Glucose-Capillary: 101 mg/dL — ABNORMAL HIGH (ref 70–99)

## 2015-02-14 LAB — PROCALCITONIN: Procalcitonin: <0.1 ng/mL

## 2015-02-14 MED ORDER — PNEUMOCOCCAL VAC POLYVALENT 25 MCG/0.5ML IJ INJ
0.5000 mL | INJECTION | INTRAMUSCULAR | Status: AC
  Administered 2015-02-15: 0.5 mL via INTRAMUSCULAR
  Filled 2015-02-14: qty 0.5

## 2015-02-14 MED ORDER — KCL IN DEXTROSE-NACL 20-5-0.45 MEQ/L-%-% IV SOLN
INTRAVENOUS | Status: DC
  Administered 2015-02-14: 75 mL/h via INTRAVENOUS
  Administered 2015-02-15 – 2015-02-18 (×4): via INTRAVENOUS
  Filled 2015-02-14 (×11): qty 1000

## 2015-02-14 MED ORDER — MAGNESIUM SULFATE 2 GM/50ML IV SOLN
2.0000 g | Freq: Once | INTRAVENOUS | Status: AC
  Administered 2015-02-14: 2 g via INTRAVENOUS
  Filled 2015-02-14: qty 50

## 2015-02-14 MED ORDER — SODIUM PHOSPHATE 3 MMOLE/ML IV SOLN
10.0000 mmol | Freq: Once | INTRAVENOUS | Status: AC
  Administered 2015-02-14: 10 mmol via INTRAVENOUS
  Filled 2015-02-14: qty 3.33

## 2015-02-14 MED ORDER — ENOXAPARIN SODIUM 40 MG/0.4ML ~~LOC~~ SOLN
40.0000 mg | SUBCUTANEOUS | Status: DC
  Administered 2015-02-14 – 2015-02-19 (×6): 40 mg via SUBCUTANEOUS
  Filled 2015-02-14 (×7): qty 0.4

## 2015-02-14 MED ORDER — DEXMEDETOMIDINE HCL IN NACL 400 MCG/100ML IV SOLN
0.4000 ug/kg/h | INTRAVENOUS | Status: DC
  Administered 2015-02-14: 0.8 ug/kg/h via INTRAVENOUS
  Administered 2015-02-14: 0.6 ug/kg/h via INTRAVENOUS
  Administered 2015-02-15 (×2): 1.2 ug/kg/h via INTRAVENOUS
  Administered 2015-02-15 (×2): 0.8 ug/kg/h via INTRAVENOUS
  Administered 2015-02-15: 1.2 ug/kg/h via INTRAVENOUS
  Administered 2015-02-15: 0.7 ug/kg/h via INTRAVENOUS
  Administered 2015-02-16: 0.8 ug/kg/h via INTRAVENOUS
  Administered 2015-02-16 (×4): 1.2 ug/kg/h via INTRAVENOUS
  Administered 2015-02-16: 1 ug/kg/h via INTRAVENOUS
  Administered 2015-02-16: 1.2 ug/kg/h via INTRAVENOUS
  Administered 2015-02-17: 0.6 ug/kg/h via INTRAVENOUS
  Administered 2015-02-17 (×2): 1.4 ug/kg/h via INTRAVENOUS
  Administered 2015-02-17: 1.2 ug/kg/h via INTRAVENOUS
  Administered 2015-02-17: 1.4 ug/kg/h via INTRAVENOUS
  Administered 2015-02-17: 1.2 ug/kg/h via INTRAVENOUS
  Administered 2015-02-18 (×3): 1.6 ug/kg/h via INTRAVENOUS
  Administered 2015-02-18: 1.4 ug/kg/h via INTRAVENOUS
  Administered 2015-02-18: 1.2 ug/kg/h via INTRAVENOUS
  Administered 2015-02-18: 1.4 ug/kg/h via INTRAVENOUS
  Administered 2015-02-18: 1.6 ug/kg/h via INTRAVENOUS
  Administered 2015-02-18: 1.4 ug/kg/h via INTRAVENOUS
  Administered 2015-02-19 (×2): 1.6 ug/kg/h via INTRAVENOUS
  Administered 2015-02-19: 1.5 ug/kg/h via INTRAVENOUS
  Administered 2015-02-19: 1.8 ug/kg/h via INTRAVENOUS
  Administered 2015-02-19: 1.7 ug/kg/h via INTRAVENOUS
  Administered 2015-02-19 (×2): 1.6 ug/kg/h via INTRAVENOUS
  Administered 2015-02-19 (×2): 1.7 ug/kg/h via INTRAVENOUS
  Administered 2015-02-20: 1.8 ug/kg/h via INTRAVENOUS
  Administered 2015-02-20: 1.7 ug/kg/h via INTRAVENOUS
  Administered 2015-02-20: 1.906 ug/kg/h via INTRAVENOUS
  Administered 2015-02-20 (×4): 1.9 ug/kg/h via INTRAVENOUS
  Administered 2015-02-20 – 2015-02-21 (×4): 1.2 ug/kg/h via INTRAVENOUS
  Administered 2015-02-21: 2.4 ug/kg/h via INTRAVENOUS
  Administered 2015-02-21 (×2): 1.2 ug/kg/h via INTRAVENOUS
  Administered 2015-02-22: 1 ug/kg/h via INTRAVENOUS
  Administered 2015-02-22 (×2): 1.2 ug/kg/h via INTRAVENOUS
  Administered 2015-02-22 (×2): 1 ug/kg/h via INTRAVENOUS
  Administered 2015-02-23: 0.8 ug/kg/h via INTRAVENOUS
  Administered 2015-02-23: 1.2 ug/kg/h via INTRAVENOUS
  Administered 2015-02-23: 1.1 ug/kg/h via INTRAVENOUS
  Administered 2015-02-23: 1 ug/kg/h via INTRAVENOUS
  Administered 2015-02-23: 0.6 ug/kg/h via INTRAVENOUS
  Administered 2015-02-24: 0.4 ug/kg/h via INTRAVENOUS
  Administered 2015-02-24: 0.8 ug/kg/h via INTRAVENOUS
  Filled 2015-02-14 (×15): qty 100
  Filled 2015-02-14: qty 200
  Filled 2015-02-14 (×48): qty 100
  Filled 2015-02-14: qty 200
  Filled 2015-02-14: qty 100

## 2015-02-14 MED ORDER — INFLUENZA VAC SPLIT QUAD 0.5 ML IM SUSY
0.5000 mL | PREFILLED_SYRINGE | INTRAMUSCULAR | Status: DC
  Filled 2015-02-14: qty 0.5

## 2015-02-14 MED ORDER — FOLIC ACID 5 MG/ML IJ SOLN
1.0000 mg | Freq: Every day | INTRAMUSCULAR | Status: DC
  Administered 2015-02-14 – 2015-02-21 (×8): 1 mg via INTRAVENOUS
  Filled 2015-02-14 (×9): qty 0.2

## 2015-02-14 NOTE — Progress Notes (Signed)
Routine EEG completed at bedside, results pending.

## 2015-02-14 NOTE — Procedures (Signed)
ELECTROENCEPHALOGRAM REPORT  Patient: Richard [name]       Room #: 2D92 EEG No. ID: [id number] Age: 44 y.o.        Sex: male Referring Physician: Ann Lions Report Date:  02/14/2015        Interpreting Physician: Anthony Sar  History: [name] is an 44 y.o. male with a history of traumatic brain injury, alcohol and cocaine abuse, and seizure disorder who presented following recurrent breakthrough seizure activity. EEG on 02/12/2015 showed a pattern consistent with normal sleep was no epileptiform activity recorded. Patient has remained somnolent.  Indications for study:  Assess severity of encephalopathy; rule out seizure activity.  Technique: This is an 18 channel routine scalp EEG performed at the bedside with bipolar and monopolar montages arranged in accordance to the international 10/20 system of electrode placement.   Description: Patient was noted to be minimally responsive at the beginning of this EEG recording, but was somewhat more responsive after Precedex was discontinued. The predominant background activity throughout the EEG recording was low amplitude diffuse 1-2 Hz delta activity with superimposed 7-8 Hz activity which was somewhat rhythmic posteriorly. Photic stimulation and hyperventilation were not performed. No epileptiform discharges were recorded. There was no change in background cerebral activity after Precedex was discontinued.  Interpretation: This is an abnormal EEG recording with mild generalized nonspecific continuous slowing of cerebral activity. No evidence of epileptic activity was seen.   Rush Farmer M.D. [facility] [phone]

## 2015-02-14 NOTE — Progress Notes (Signed)
Subjective: Patient is very sedated. He will arouse to sternal rub and noxious stimuli and if given enough stimuli he becomes agitated then falls back to sleep.   Exam: Filed Vitals:   02/14/15 0800  BP: 133/91  Pulse: 73  Temp: 98.1 F (36.7 C)  Resp: 22   Gen: In bed, NAD General: Mental Status: Very sedated only awakening to noxious stimuli.  Upon waking he grimaces and transiently becomes agitated then falls back to sleep.  Cranial Nerves: BT:DHRCB eyes to pain but no blink to threat noted.  pupils equal, round, reactive to light and accommodation III,IV, VI: ptosis not present, oculocephalic reflex intact V,VII: face symmetric, facial light touch sensation normal bilaterally VIII: hearing normal bilaterally IX,X: gag reflex present   Motor: At present time only moves extremites when noxious stimuli given and moves them minimally but equally.  No increased tone noted.  Sensory: Pinprick and light touch intact throughout, bilaterally Deep Tendon Reflexes:  Right: Upper Extremity   Left: Upper extremity   biceps (C-5 to C-6) 2/4   biceps (C-5 to C-6) 2/4 tricep (C7) 2/4    triceps (C7) 2/4 Brachioradialis (C6) 2/4  Brachioradialis (C6) 2/4  Lower Extremity Lower Extremity  quadriceps (L-2 to L-4) 2/4   quadriceps (L-2 to L-4) 2/4 Achilles (S1) 2/4   Achilles (S1) 2/4  Plantars: Right: downgoing   Left: downgoing    Pertinent Labs: Labs reviewed --CMP WNL, WBC 11.0  Echo WNL EEG with mild generalized nonspecific continuous slowing of cerebral activity. No evidence of epileptic activity was seen.  Impression:  43y/o gentleman with history of seizures, TBI, EtOH and cocaine abuse presenting with breakthrough seizures.  Currently on Precedex for agitation and very sedated.  No recurrent seizures noted.   Recommendations: 1) Continue Scheduled Ativan.  2) Continue Keppra at current dose.   @MPKSIGN @

## 2015-02-14 NOTE — Progress Notes (Signed)
eLink Nursing ICU Electrolyte Replacement Protocol  Patient Name: Korrey Simm DOB: 02/01/1971 MRN: [id number]  Date of Service  02/14/2015   HPI/Events of Note    Recent Labs Lab 02/11/15 2043 02/11/15 2109 02/12/15 0210 02/13/15 0304 02/14/15 0235  NA 142 143 144 139 138  K 4.4 4.2 4.5 4.0 3.9  CL 113* 110 112 110 107  CO2 16*  --  19 18* 23  GLUCOSE 88 78 86 105* 127*  BUN 11 12 8 5* <5*  CREATININE 1.10 1.30 1.11 1.08 0.89  CALCIUM 9.2  --  8.3* 8.8 8.6  MG  --   --   --  1.7 1.8  PHOS  --   --   --   --  2.0*    Estimated Creatinine Clearance: 128.4 mL/min (by C-G formula based on Cr of 0.89).  Intake/Output      03/02 0701 - 03/03 0700   I.V. (mL/kg) 2081.8 (21.3)   IV Piggyback 200   Total Intake(mL/kg) 2281.8 (23.3)   Urine (mL/kg/hr) 1150 (0.5)   Total Output 1150   Net +1131.8        - I/O DETAILED x24h    Total I/O In: 873 [I.V.:773; IV Piggyback:100] Out: 450 [Urine:450] - I/O THIS SHIFT    ASSESSMENT   eICURN Interventions  Electrolyte protocol criteria met. Lab values replaced per protocol. MD notified.   ASSESSMENT: MAJOR ELECTROLYTE    ,  Nicole 02/14/2015, 4:47 AM    

## 2015-02-14 NOTE — Progress Notes (Signed)
eLink Physician-Brief Progress Note Patient Name: Richard [name] DOB: [birth year] MRN: [id number]   Date of Service  02/14/2015  HPI/Events of Note  Patient hypothermic with temp = 93.8 F rectally.  eICU Interventions  Will order: 1. Blood Cultures X 2.  2. UA with reflex culture. 3. [facility]. Warm to 96 F.      Intervention Category Intermediate Interventions: Infection - evaluation and management  [name],[name] 02/14/2015, 9:05 PM

## 2015-02-14 NOTE — Progress Notes (Signed)
RASS -3 Not F/C No overt distress  Filed Vitals:   02/14/15 0606 02/14/15 0635 02/14/15 0700 02/14/15 0800  BP:   119/66 133/91  Pulse: 81 79 78 73  Temp:    98.1 F (36.7 C)  TempSrc:    Oral  Resp: 25 26 25 22   Height:      Weight:      SpO2: 97% 90% 100% 100%   NAD No JVD Chest clear anteriorly Reg, no M NABS, soft Ext warm without edema MAEs  I have reviewed all of today's lab results. Relevant abnormalities are discussed in the A/P section  No new CXR   IMPRESSION: Acute hypoxic resp failure LLL Atx Hypertrophic cardiomyopathy Alcohol withdrawal with delirium - controlled on dexmedetomidine Alcohol withdrawal seizure  R/O subclinical seizures  PLAN: Cont ICU mgmt Cont dex Cont lorazepam per CIWA ContKeppra per Neuro EEG today negative for seizures   Merton Border, MD ; [facility] service Mobile [phone].  After 5:30 PM or weekends, call [phone]

## 2015-02-15 ENCOUNTER — Inpatient Hospital Stay (HOSPITAL_COMMUNITY): Payer: Medicare Other

## 2015-02-15 DIAGNOSIS — E874 Mixed disorder of acid-base balance: Secondary | ICD-10-CM | POA: Diagnosis not present

## 2015-02-15 DIAGNOSIS — J810 Acute pulmonary edema: Secondary | ICD-10-CM | POA: Diagnosis not present

## 2015-02-15 DIAGNOSIS — Z8673 Personal history of transient ischemic attack (TIA), and cerebral infarction without residual deficits: Secondary | ICD-10-CM | POA: Diagnosis not present

## 2015-02-15 DIAGNOSIS — Z87820 Personal history of traumatic brain injury: Secondary | ICD-10-CM | POA: Diagnosis not present

## 2015-02-15 DIAGNOSIS — F10231 Alcohol dependence with withdrawal delirium: Secondary | ICD-10-CM | POA: Diagnosis not present

## 2015-02-15 DIAGNOSIS — E43 Unspecified severe protein-calorie malnutrition: Secondary | ICD-10-CM | POA: Diagnosis not present

## 2015-02-15 DIAGNOSIS — F17210 Nicotine dependence, cigarettes, uncomplicated: Secondary | ICD-10-CM | POA: Diagnosis not present

## 2015-02-15 DIAGNOSIS — G40909 Epilepsy, unspecified, not intractable, without status epilepticus: Secondary | ICD-10-CM | POA: Diagnosis not present

## 2015-02-15 DIAGNOSIS — I422 Other hypertrophic cardiomyopathy: Secondary | ICD-10-CM | POA: Diagnosis not present

## 2015-02-15 DIAGNOSIS — I82441 Acute embolism and thrombosis of right tibial vein: Secondary | ICD-10-CM | POA: Diagnosis not present

## 2015-02-15 DIAGNOSIS — B958 Unspecified staphylococcus as the cause of diseases classified elsewhere: Secondary | ICD-10-CM | POA: Diagnosis not present

## 2015-02-15 DIAGNOSIS — I10 Essential (primary) hypertension: Secondary | ICD-10-CM | POA: Diagnosis not present

## 2015-02-15 DIAGNOSIS — J9601 Acute respiratory failure with hypoxia: Secondary | ICD-10-CM | POA: Diagnosis not present

## 2015-02-15 DIAGNOSIS — G92 Toxic encephalopathy: Secondary | ICD-10-CM | POA: Diagnosis not present

## 2015-02-15 DIAGNOSIS — R680 Hypothermia, not associated with low environmental temperature: Secondary | ICD-10-CM | POA: Diagnosis not present

## 2015-02-15 DIAGNOSIS — R7881 Bacteremia: Secondary | ICD-10-CM | POA: Diagnosis not present

## 2015-02-15 DIAGNOSIS — Z79899 Other long term (current) drug therapy: Secondary | ICD-10-CM | POA: Diagnosis not present

## 2015-02-15 DIAGNOSIS — E876 Hypokalemia: Secondary | ICD-10-CM | POA: Diagnosis not present

## 2015-02-15 LAB — CBC WITH DIFFERENTIAL/PLATELET
BASOS ABS: 0 10*3/uL (ref 0.0–0.1)
Basophils Relative: 0 % (ref 0–1)
EOS PCT: 0 % (ref 0–5)
Eosinophils Absolute: 0 10*3/uL (ref 0.0–0.7)
HCT: 48.6 % (ref 39.0–52.0)
Hemoglobin: 16.5 g/dL (ref 13.0–17.0)
Lymphocytes Relative: 10 % — ABNORMAL LOW (ref 12–46)
Lymphs Abs: 0.8 10*3/uL (ref 0.7–4.0)
MCH: 31.9 pg (ref 26.0–34.0)
MCHC: 34 g/dL (ref 30.0–36.0)
MCV: 94 fL (ref 78.0–100.0)
Monocytes Absolute: 1.2 10*3/uL — ABNORMAL HIGH (ref 0.1–1.0)
Monocytes Relative: 16 % — ABNORMAL HIGH (ref 3–12)
NEUTROS ABS: 5.4 10*3/uL (ref 1.7–7.7)
Neutrophils Relative %: 74 % (ref 43–77)
PLATELETS: 262 10*3/uL (ref 150–400)
RBC: 5.17 MIL/uL (ref 4.22–5.81)
RDW: 14.6 % (ref 11.5–15.5)
WBC: 7.4 10*3/uL (ref 4.0–10.5)

## 2015-02-15 LAB — URINALYSIS, ROUTINE W REFLEX MICROSCOPIC
Bilirubin Urine: NEGATIVE
Glucose, UA: NEGATIVE mg/dL
HGB URINE DIPSTICK: NEGATIVE
Ketones, ur: NEGATIVE mg/dL
LEUKOCYTES UA: NEGATIVE
Nitrite: NEGATIVE
Protein, ur: NEGATIVE mg/dL
SPECIFIC GRAVITY, URINE: 1.013 (ref 1.005–1.030)
UROBILINOGEN UA: 0.2 mg/dL (ref 0.0–1.0)
pH: 6 (ref 5.0–8.0)

## 2015-02-15 LAB — PROCALCITONIN: Procalcitonin: <0.1 ng/mL

## 2015-02-15 LAB — COMPREHENSIVE METABOLIC PANEL
ALT: 28 U/L (ref 0–53)
ANION GAP: 7 (ref 5–15)
AST: 23 U/L (ref 0–37)
Albumin: 2.8 g/dL — ABNORMAL LOW (ref 3.5–5.2)
Alkaline Phosphatase: 71 U/L (ref 39–117)
BILIRUBIN TOTAL: 1.1 mg/dL (ref 0.3–1.2)
CHLORIDE: 110 mmol/L (ref 96–112)
CO2: 22 mmol/L (ref 19–32)
Calcium: 8.4 mg/dL (ref 8.4–10.5)
Creatinine, Ser: 0.85 mg/dL (ref 0.50–1.35)
GFR calc non Af Amer: >90 mL/min (ref >90)
GLUCOSE: 117 mg/dL — AB (ref 70–99)
POTASSIUM: 4 mmol/L (ref 3.5–5.1)
SODIUM: 139 mmol/L (ref 135–145)
Total Protein: 5.8 g/dL — ABNORMAL LOW (ref 6.0–8.3)

## 2015-02-15 LAB — URINE CULTURE: Colony Count: 75000

## 2015-02-15 MED ORDER — METOPROLOL TARTRATE 1 MG/ML IV SOLN
2.5000 mg | INTRAVENOUS | Status: DC | PRN
  Filled 2015-02-15: qty 5

## 2015-02-15 MED ORDER — HYDRALAZINE HCL 20 MG/ML IJ SOLN
10.0000 mg | INTRAMUSCULAR | Status: DC | PRN
  Administered 2015-02-15: 10 mg via INTRAVENOUS
  Administered 2015-02-17: 20 mg via INTRAVENOUS
  Administered 2015-02-17: 10 mg via INTRAVENOUS
  Administered 2015-02-18 – 2015-02-20 (×9): 20 mg via INTRAVENOUS
  Filled 2015-02-15: qty 2
  Filled 2015-02-15 (×11): qty 1

## 2015-02-15 MED ORDER — LORAZEPAM 2 MG/ML IJ SOLN
1.0000 mg | Freq: Once | INTRAMUSCULAR | Status: AC
  Administered 2015-02-15: 3 mg via INTRAVENOUS
  Filled 2015-02-15: qty 2

## 2015-02-15 MED ORDER — THIAMINE HCL 100 MG/ML IJ SOLN
500.0000 mg | Freq: Every day | INTRAVENOUS | Status: AC
  Administered 2015-02-16 – 2015-02-17 (×2): 500 mg via INTRAVENOUS
  Filled 2015-02-15 (×2): qty 5

## 2015-02-15 MED ORDER — SODIUM CHLORIDE 0.9 % IV SOLN
500.0000 mg | Freq: Three times a day (TID) | INTRAVENOUS | Status: DC
  Administered 2015-02-15: 500 mg via INTRAVENOUS
  Filled 2015-02-15 (×3): qty 5

## 2015-02-15 MED ORDER — THIAMINE HCL 100 MG/ML IJ SOLN: 500.0000 mg | Freq: Three times a day (TID) | INTRAVENOUS | Status: DC

## 2015-02-15 MED ORDER — LORAZEPAM 2 MG/ML IJ SOLN
1.0000 mg | INTRAMUSCULAR | Status: DC | PRN
  Administered 2015-02-15 – 2015-02-16 (×3): 2 mg via INTRAVENOUS
  Administered 2015-02-16: 1 mg via INTRAVENOUS
  Administered 2015-02-16 – 2015-02-18 (×7): 2 mg via INTRAVENOUS
  Filled 2015-02-15 (×11): qty 1

## 2015-02-15 MED ORDER — VITAMIN B-1 100 MG PO TABS: 100.0000 mg | ORAL_TABLET | Freq: Every day | ORAL | Status: DC

## 2015-02-15 MED ORDER — THIAMINE HCL 100 MG/ML IJ SOLN
100.0000 mg | Freq: Every day | INTRAMUSCULAR | Status: DC
  Administered 2015-02-17 – 2015-02-21 (×5): 100 mg via INTRAVENOUS
  Filled 2015-02-15 (×5): qty 1

## 2015-02-15 NOTE — Progress Notes (Signed)
Subjective: Still alterantes between agitation and sedation.   Exam: Filed Vitals:   02/15/15 0736  BP: 117/90  Pulse:   Temp: 96.4 F (35.8 C)  Resp:    Gen: In bed, NAD MS: Awakens to minor stimuli, keeps saying "I want to go home". He responds "I'm listening" when I tell him that he has to listen to [place] if he wants to go home, but does not follow any commands.  CN: PERRL, does not look to the right or left.  Motor: MAEW, purposeful.  Sensory:responds to nox stim x 4.   Pertinent Labs: Mildly elevated ammonia.   Impression: 44 yo M with EtOH withdrawal. With persistent severe agitation, will rule out other contributing factors such as recurrent stroke or seizures. I doubt that he would be able to tolerate an overnight EEG, but may be able to perform a two hour EEG.   Recommendations: 1) MRI brain 2) Prolonged EEG.  3) will continue to follow.   Roland Rack, MD [facility] [phone]  If 7pm- 7am, please page neurology on call as listed in [place].

## 2015-02-15 NOTE — Progress Notes (Signed)
Pt unable to void. Bladder scanned pt and showed >999 mL of urine retention. MD made aware. In and out cath order placed.

## 2015-02-15 NOTE — Procedures (Signed)
History: 44 yo M With likley etoh withdrawal  Sedation: Precedex, ativan  Technique: This is a 17 channel routine scalp EEG performed at the bedside with bipolar and monopolar montages arranged in accordance to the international 10/20 system of electrode placement. One channel was dedicated to EKG recording.    Background: The background consists of irregular delta activity with occasional structures resembling sleep spindles. There are a few brief arousals, however even during these there is marked generalized delta activity.   Photic stimulation: Physiologic driving is not performed  EEG Abnormalities: 1) Generalized slow activity.   Clinical Interpretation: This EEG is consistent with a generalized non-specific cerebral dysfunction(encephalopathy). Though non-specific, this can be seen with sedating medications There was no seizure or seizure predisposition recorded on this study.   Roland Rack, MD [facility] [phone]  If 7pm- 7am, please page neurology on call as listed in [place].

## 2015-02-15 NOTE — Progress Notes (Signed)
Prolonged EEG started.

## 2015-02-15 NOTE — Progress Notes (Addendum)
  Subjective:  Pt has been intermittently agitated, calling out   Filed Vitals:   02/15/15 1140 02/15/15 1200 02/15/15 1300 02/15/15 1400  BP: 143/104 133/97 126/100 127/99  Pulse:  72 72 71  Temp:  97.2 F (36.2 C)    TempSrc:  Oral    Resp:  23 24 23   Height:      Weight:      SpO2:  95% 97% 97%   Some agitation No JVD Chest clear anteriorly Reg, no M NABS, soft Ext warm without edema MAEs   Recent Labs Lab 02/14/15 0235 02/15/15 0248 02/16/15 0310  HGB 16.3 16.5 17.1*  HCT 48.3 48.6 51.0  WBC 11.0* 7.4 10.7*  PLT 213 262 219    Recent Labs Lab 02/12/15 0210 02/13/15 0304 02/14/15 0235 02/15/15 0248 02/16/15 0310  NA 144 139 138 139 140  K 4.5 4.0 3.9 4.0 3.9  CL 112 110 107 110 109  CO2 19 18* 23 22 22   GLUCOSE 86 105* 127* 117* 126*  BUN 8 5* <5* <5* <5*  CREATININE 1.11 1.08 0.89 0.85 0.92  CALCIUM 8.3* 8.8 8.6 8.4 8.7  MG  --  1.7 1.8  --   --   PHOS  --   --  2.0*  --   --     Recent Labs Lab 02/11/15 2106 02/11/15 2109 02/12/15 0403 02/13/15 0859 02/13/15 1849  PHART 7.278*  --  7.259* 7.327* 7.422  PCO2ART 41.4  --  47.2* 41.0 33.5*  PO2ART 75.0*  --  48.0* 212.0* 130.0*  HCO3 19.3*  --  20.4 21.3 21.5  TCO2 21 16 21.9 22 22.5  O2SAT 93.0  --  75.3 100.0 98.5     CXR: prob CM, vasc cong, perhaps mild IS edema   IMPRESSION: Acute hypoxic resp failure, improved LLL Atx Hypertrophic cardiomyopathy Alcohol withdrawal with delirium Alcohol withdrawal seizure; no seizures on EEG from 3/4, no new findings on MRI 3/4  PLAN: Will need to continue ICU mgmt Cont dexmedetomidine and will likely need to increase on 3/5 Cont lorazepam per CIWA + scheduled doses q12 Cont Keppra per Neuro  35 minutes CC time  Baltazar Apo, MD, PhD 02/16/2015, 12:51 PM [facility] [phone] or if no answer [phone]

## 2015-02-16 LAB — CBC WITH DIFFERENTIAL/PLATELET
Basophils Absolute: 0 10*3/uL (ref 0.0–0.1)
Basophils Relative: 0 % (ref 0–1)
EOS ABS: 0 10*3/uL (ref 0.0–0.7)
EOS PCT: 0 % (ref 0–5)
HEMATOCRIT: 51 % (ref 39.0–52.0)
Hemoglobin: 17.1 g/dL — ABNORMAL HIGH (ref 13.0–17.0)
Lymphocytes Relative: 6 % — ABNORMAL LOW (ref 12–46)
Lymphs Abs: 0.7 10*3/uL (ref 0.7–4.0)
MCH: 31.6 pg (ref 26.0–34.0)
MCHC: 33.5 g/dL (ref 30.0–36.0)
MCV: 94.3 fL (ref 78.0–100.0)
Monocytes Absolute: 1.5 10*3/uL — ABNORMAL HIGH (ref 0.1–1.0)
Monocytes Relative: 14 % — ABNORMAL HIGH (ref 3–12)
Neutro Abs: 8.5 10*3/uL — ABNORMAL HIGH (ref 1.7–7.7)
Neutrophils Relative %: 80 % — ABNORMAL HIGH (ref 43–77)
Platelets: 219 10*3/uL (ref 150–400)
RBC: 5.41 MIL/uL (ref 4.22–5.81)
RDW: 14.5 % (ref 11.5–15.5)
WBC: 10.7 10*3/uL — ABNORMAL HIGH (ref 4.0–10.5)

## 2015-02-16 LAB — COMPREHENSIVE METABOLIC PANEL
ALK PHOS: 66 U/L (ref 39–117)
ALT: 30 U/L (ref 0–53)
AST: 22 U/L (ref 0–37)
Albumin: 2.8 g/dL — ABNORMAL LOW (ref 3.5–5.2)
Anion gap: 9 (ref 5–15)
CO2: 22 mmol/L (ref 19–32)
CREATININE: 0.92 mg/dL (ref 0.50–1.35)
Calcium: 8.7 mg/dL (ref 8.4–10.5)
Chloride: 109 mmol/L (ref 96–112)
GFR calc non Af Amer: >90 mL/min (ref >90)
Glucose, Bld: 126 mg/dL — ABNORMAL HIGH (ref 70–99)
Potassium: 3.9 mmol/L (ref 3.5–5.1)
Sodium: 140 mmol/L (ref 135–145)
Total Bilirubin: 0.9 mg/dL (ref 0.3–1.2)
Total Protein: 6.3 g/dL (ref 6.0–8.3)

## 2015-02-16 LAB — HIV ANTIBODY (ROUTINE TESTING W REFLEX): HIV SCREEN 4TH GENERATION: NONREACTIVE

## 2015-02-16 MED ORDER — VANCOMYCIN HCL IN DEXTROSE 1-5 GM/200ML-% IV SOLN
1000.0000 mg | Freq: Three times a day (TID) | INTRAVENOUS | Status: DC
  Administered 2015-02-17 – 2015-02-19 (×9): 1000 mg via INTRAVENOUS
  Filled 2015-02-16 (×10): qty 200

## 2015-02-16 MED ORDER — MORPHINE SULFATE 2 MG/ML IJ SOLN
1.0000 mg | Freq: Once | INTRAMUSCULAR | Status: AC
  Administered 2015-02-16: 1 mg via INTRAVENOUS
  Filled 2015-02-16: qty 1

## 2015-02-16 MED ORDER — VANCOMYCIN HCL 10 G IV SOLR
2000.0000 mg | Freq: Once | INTRAVENOUS | Status: AC
  Administered 2015-02-16: 2000 mg via INTRAVENOUS
  Filled 2015-02-16: qty 2000

## 2015-02-16 NOTE — Progress Notes (Signed)
ANTIBIOTIC CONSULT NOTE - INITIAL  Pharmacy Consult for vancomycin Indication: bacteremia  Allergies  Allergen Reactions  . Dilaudid [Hydromorphone Hcl] Other (See Comments)    Bradycardia and Nausea    Patient Measurements: Height: 6' (182.9 cm) Weight: 215 lb 9.8 oz (97.8 kg) IBW/kg (Calculated) : 77.6   Vital Signs: Temp: 97.5 F (36.4 C) (03/05 1200) Temp Source: Oral (03/05 1200) BP: 162/118 mmHg (03/05 1500) Intake/Output from previous day: 03/04 0701 - 03/05 0700 In: 2861.9 [I.V.:2611.9; IV Piggyback:250] Out: 2400 [Urine:2400] Intake/Output from this shift: Total I/O In: 657.1 [I.V.:557.1; IV Piggyback:100] Out: -   Labs:  Recent Labs  02/14/15 0235 02/15/15 0248 02/16/15 0310  WBC 11.0* 7.4 10.7*  HGB 16.3 16.5 17.1*  PLT 213 262 219  CREATININE 0.89 0.85 0.92   Estimated Creatinine Clearance: 124.2 mL/min (by C-G formula based on Cr of 0.92). No results for input(s): VANCOTROUGH, VANCOPEAK, VANCORANDOM, GENTTROUGH, GENTPEAK, GENTRANDOM, TOBRATROUGH, TOBRAPEAK, TOBRARND, AMIKACINPEAK, AMIKACINTROU, AMIKACIN in the last 72 hours.   Microbiology: Recent Results (from the past 720 hour(s))  MRSA PCR Screening     Status: None   Collection Time: 02/12/15  1:25 AM  Result Value Ref Range Status   MRSA by PCR NEGATIVE NEGATIVE Final    Comment:        The GeneXpert MRSA Assay (FDA approved for NASAL specimens only), is one component of a comprehensive MRSA colonization surveillance program. It is not intended to diagnose MRSA infection nor to guide or monitor treatment for MRSA infections.   Urine culture     Status: None   Collection Time: 02/12/15  6:16 AM  Result Value Ref Range Status   Specimen Description URINE, RANDOM  Final   Special Requests NONE  Final   Colony Count   Final    75,000 COLONIES/ML Performed at [facility]    Culture   Final    STAPHYLOCOCCUS SPECIES (COAGULASE NEGATIVE) Note: RIFAMPIN AND GENTAMICIN SHOULD  NOT BE USED AS SINGLE DRUGS FOR TREATMENT OF STAPH INFECTIONS. Performed at [facility]    Report Status 02/15/2015 FINAL  Final   Organism ID, Bacteria STAPHYLOCOCCUS SPECIES (COAGULASE NEGATIVE)  Final      Susceptibility   Staphylococcus species (coagulase negative) - MIC*    GENTAMICIN <=0.5 SENSITIVE Sensitive     LEVOFLOXACIN <=0.12 SENSITIVE Sensitive     NITROFURANTOIN <=16 SENSITIVE Sensitive     OXACILLIN >=4 RESISTANT Resistant     PENICILLIN RESISTANT      RIFAMPIN <=0.5 SENSITIVE Sensitive     TRIMETH/SULFA <=10 SENSITIVE Sensitive     VANCOMYCIN 1 SENSITIVE Sensitive     TETRACYCLINE <=1 SENSITIVE Sensitive     * STAPHYLOCOCCUS SPECIES (COAGULASE NEGATIVE)  Culture, blood (routine x 2)     Status: None (Preliminary result)   Collection Time: 02/14/15 10:13 PM  Result Value Ref Range Status   Specimen Description BLOOD LEFT ARM  Final   Special Requests BOTTLES DRAWN AEROBIC ONLY 2CC  Final   Culture   Final    GRAM POSITIVE COCCI IN CLUSTERS Note: Gram Stain Report Called to,Read Back By and Verified With: [facility] STOWE 02/16/15 AT 0240 [place] Performed at [facility]    Report Status PENDING  Incomplete  Culture, blood (routine x 2)     Status: None (Preliminary result)   Collection Time: 02/14/15 10:24 PM  Result Value Ref Range Status   Specimen Description BLOOD LEFT HAND  Final   Special Requests BOTTLES DRAWN AEROBIC  ONLY 2CC  Final   Culture   Final    GRAM POSITIVE COCCI IN CLUSTERS Note: Gram Stain Report Called to,Read Back By and Verified With: [place] 02/16/15 AT 0445 RIDK Performed at [facility]    Report Status PENDING  Incomplete    Medical History: Past Medical History  Diagnosis Date  . Hypertension   . Seizures     last episode 03/2013  . Asthma   . Brain bleed     Medications:  Prescriptions prior to admission  Medication Sig Dispense Refill Last Dose  . amLODipine (NORVASC) 10 MG tablet Take 1 tablet (10  mg total) by mouth daily. 30 tablet 1 unknown  . atenolol (TENORMIN) 50 MG tablet Take 1 tablet (50 mg total) by mouth daily. 30 tablet 3 unknown  . cloNIDine (CATAPRES) 0.1 MG tablet Take 0.1 mg by mouth 2 (two) times daily.  6 unknown  . hydrALAZINE (APRESOLINE) 50 MG tablet Take 1 tablet (50 mg total) by mouth every 8 (eight) hours. 90 tablet 3 unknown  . hydrochlorothiazide (HYDRODIURIL) 12.5 MG tablet Take 1 tablet (12.5 mg total) by mouth daily. 30 tablet 0 12/14/2014 at Unknown time  . omeprazole (PRILOSEC) 40 MG capsule Take 40 mg by mouth daily.  6 unknown  . phenytoin (DILANTIN) 100 MG ER capsule Take 300 mg by mouth daily.   12  at unknown  . phenytoin (DILANTIN) 50 MG tablet Chew 100 mg by mouth 2 (two) times daily.   12/14/2014 at Unknown time   Scheduled:  . antiseptic oral rinse  7 mL Mouth Rinse BID  . enoxaparin (LOVENOX) injection  40 mg Subcutaneous Q24H  . folic acid  1 mg Intravenous Daily  . Influenza vac split quadrivalent PF  0.5 mL Intramuscular Tomorrow-1000  . levETIRAcetam  500 mg Intravenous Q12H  . LORazepam  1 mg Intravenous Q12H  . pantoprazole (PROTONIX) IV  40 mg Intravenous Q24H  . sodium chloride  3 mL Intravenous Q12H  . [START ON 02/17/2015] thiamine  100 mg Intravenous Daily  . thiamine IV  500 mg Intravenous Daily    Assessment: 44 yo male here with ETOH withdrawal and now with blood cultures showing GPC/clusters 2/2 to start vancomycin per pharmacy. WBC= 10.7, afebrile, SCr= 0.92 and CrCl > 100.   3/5 vancomycin>>  3/3 blood x2: GPC/clusters 2/2  Goal of Therapy:  Vancomycin trough level 15-20 mcg/ml  Plan:  -Vancomycin 2000mg  IV x1 followed by 1000mg  IV q8h -Will follow renal function, cultures and clinical progress  Hildred Laser, Pharm D 02/16/2015 4:11 PM

## 2015-02-16 NOTE — Progress Notes (Signed)
Subjective: Seems less agitated today.   Exam: Filed Vitals:   02/16/15 0900  BP: 134/96  Pulse:   Temp:   Resp: 22   Gen: In bed, NAD MS: Awakens to minor stimuli, keeps saying "I want to go home". He responds "I'm listening" when I tell him that he has to listen to [place] if he wants to go home, but does not follow any commands.  CN: PERRL, does not look to the right or left.  Motor: MAEW, purposeful.  Sensory:responds to nox stim x 4.    Impression: 44 yo M with EtOH withdrawal. With persistent severe agitation, will rule out other contributing factors such as recurrent stroke or seizures. I doubt that he would be able to tolerate an overnight EEG, but may be able to perform a two hour EEG. Given the fact that he is not looking right or left, I am concerned that wernicke's could be playing a role and have started high dose thiamine.   Recommendations: 1) Continue thiamine, withdrawal precautions.    Roland Rack, MD [facility] [phone]  If 7pm- 7am, please page neurology on call as listed in [place].

## 2015-02-16 NOTE — Progress Notes (Signed)
eLink Physician-Brief Progress Note Patient Name: Richard [name] DOB: 1971-10-13 MRN: [id number]   Date of Service  02/16/2015  HPI/Events of Note  GPC in clusters 2/2 Bld Cx, stable hemodynamics, no fever no inc wbc  eICU Interventions  Start vanc, follow up S\S     Intervention Category Intermediate Interventions: Infection - evaluation and management  Nikolay Demetriou 02/16/2015, 3:49 PM

## 2015-02-17 DIAGNOSIS — B958 Unspecified staphylococcus as the cause of diseases classified elsewhere: Secondary | ICD-10-CM | POA: Diagnosis not present

## 2015-02-17 DIAGNOSIS — R7881 Bacteremia: Secondary | ICD-10-CM | POA: Diagnosis not present

## 2015-02-17 LAB — BASIC METABOLIC PANEL
ANION GAP: 4 — AB (ref 5–15)
BUN: 6 mg/dL (ref 6–23)
CALCIUM: 8.2 mg/dL — AB (ref 8.4–10.5)
CHLORIDE: 108 mmol/L (ref 96–112)
CO2: 26 mmol/L (ref 19–32)
Creatinine, Ser: 0.97 mg/dL (ref 0.50–1.35)
GFR calc Af Amer: >90 mL/min (ref >90)
GFR calc non Af Amer: >90 mL/min (ref >90)
GLUCOSE: 121 mg/dL — AB (ref 70–99)
Potassium: 4.2 mmol/L (ref 3.5–5.1)
Sodium: 138 mmol/L (ref 135–145)

## 2015-02-17 MED ORDER — LORAZEPAM 2 MG/ML IJ SOLN
4.0000 mg | INTRAMUSCULAR | Status: AC
Start: 2015-02-17 — End: 2015-02-17
  Administered 2015-02-17: 4 mg via INTRAMUSCULAR

## 2015-02-17 MED ORDER — HALOPERIDOL LACTATE 5 MG/ML IJ SOLN
5.0000 mg | INTRAMUSCULAR | Status: AC
  Administered 2015-02-17: 5 mg via INTRAMUSCULAR

## 2015-02-17 MED ORDER — MORPHINE SULFATE 2 MG/ML IJ SOLN
1.0000 mg | Freq: Once | INTRAMUSCULAR | Status: AC
  Administered 2015-02-17: 1 mg via INTRAVENOUS
  Filled 2015-02-17: qty 1

## 2015-02-17 MED ORDER — HALOPERIDOL LACTATE 5 MG/ML IJ SOLN
INTRAMUSCULAR | Status: AC
  Filled 2015-02-17: qty 1

## 2015-02-17 MED ORDER — LORAZEPAM 2 MG/ML IJ SOLN
INTRAMUSCULAR | Status: AC
  Filled 2015-02-17: qty 2

## 2015-02-17 NOTE — Progress Notes (Signed)
  Subjective/ Interval events:  precedex had been weaned to off, but then Became agitated overnight, ? Due to infiltrated IV His other access was d/c'd (despite working and looking fine) because it had expired. This am he has no IV access at all He had 2 of 2 blood cx's positive for staph called 3/5  Filed Vitals:   02/17/15 0800 02/17/15 0900 02/17/15 1000 02/17/15 1004  BP: 177/129 180/131 188/127 188/127  Pulse: 68     Temp:      TempSrc:      Resp: 27 17 12    Height:      Weight:      SpO2: 92% 96% 95%    Very agitated and calling out No JVD Chest clear anteriorly Reg, no M NABS, soft Ext warm without edema MAEs   Recent Labs Lab 02/14/15 0235 02/15/15 0248 02/16/15 0310  HGB 16.3 16.5 17.1*  HCT 48.3 48.6 51.0  WBC 11.0* 7.4 10.7*  PLT 213 262 219    Recent Labs Lab 02/13/15 0304 02/14/15 0235 02/15/15 0248 02/16/15 0310 02/17/15 0235  NA 139 138 139 140 138  K 4.0 3.9 4.0 3.9 4.2  CL 110 107 110 109 108  CO2 18* 23 22 22 26   GLUCOSE 105* 127* 117* 126* 121*  BUN 5* <5* <5* <5* 6  CREATININE 1.08 0.89 0.85 0.92 0.97  CALCIUM 8.8 8.6 8.4 8.7 8.2*  MG 1.7 1.8  --   --   --   PHOS  --  2.0*  --   --   --     Recent Labs Lab 02/11/15 2106 02/11/15 2109 02/12/15 0403 02/13/15 0859 02/13/15 1849  PHART 7.278*  --  7.259* 7.327* 7.422  PCO2ART 41.4  --  47.2* 41.0 33.5*  PO2ART 75.0*  --  48.0* 212.0* 130.0*  HCO3 19.3*  --  20.4 21.3 21.5  TCO2 21 16 21.9 22 22.5  O2SAT 93.0  --  75.3 100.0 98.5     CXR: prob CM, vasc cong, perhaps mild IS edema   IMPRESSION: Acute hypoxic resp failure, improved LLL Atx Hypertrophic cardiomyopathy Alcohol withdrawal with delirium Concern for possible Wernicke's encephalopathy Alcohol withdrawal seizure; no seizures on EEG from 3/4, no new findings on MRI 3/4 Staph bacteremia > note that he had 75k cfu MRSE on a urine cx that was not being treated; ? Whether these cx will be MRSE  PLAN: Will need to  continue ICU mgmt High dose thiamine ordered Haldol and ativan IM now Cont dexmedetomidine once we re-establish IV access Cont lorazepam per CIWA + scheduled doses q12 Cont Keppra  Will continue vancomycin (started 3/5 pm) while we await speciation and sensitivities on blood cx's  35 minutes CC time  Baltazar Apo, MD, PhD 02/17/2015, 11:41 AM [facility] [phone] or if no answer [phone]

## 2015-02-17 NOTE — Progress Notes (Signed)
Subjective: Continues to have agitation alternatign with sedation  Exam: Filed Vitals:   02/17/15 0800  BP: 177/129  Pulse: 68  Temp:   Resp: 27   Gen: In bed, NAD MS: Awakens to minor stimuli, tells me that he is sleeping and wants to be left alone.  CN: PERRL, does not look to the right or left.  Motor: MAEW, purposeful.  Sensory:responds to nox stim x 4.    Impression: 44 yo M with EtOH withdrawal. With persistent severe agitation, will rule out other contributing factors such as recurrent stroke or seizures. I doubt that he would be able to tolerate an overnight EEG, but may be able to perform a two hour EEG. Given the fact that he is not looking right or left, I am concerned that wernicke's could be playing a role and have started high dose thiamine.   Recommendations: 1) Continue thiamine, withdrawal precautions.    Roland Rack, MD [facility] [phone]  If 7pm- 7am, please page neurology on call as listed in [place].

## 2015-02-17 NOTE — Progress Notes (Signed)
2 CSW orders received today for Substance Abuse assessment.  Per nursing notes- patient is restrained, yelling, cursing and aggressive towards staff.  Unable to  Complete SA assessment while patient is in this staff. CSW will sign off for now. Please re-order when patient is more appropriate for assessment. CSW will be available to assist.  Thanks!  Lorie Phenix. Pauline Good, [place]

## 2015-02-17 NOTE — Progress Notes (Signed)
PICC requested due to no PIV access.  Unable to contact wife via telephone.  Spoke with mother who was uncomfortable with giving consent.  Assessed pt for PIV.  Pt very agitated at this time, yelling, cursing and aggressive toward staff with BUE restraints in place.  Able to place 2 PIV after Ativan and Haldol administered, still requiring multiple staff assist.  Dr. [name] at bedside aware of inability to place PICC due to consent and agitation.

## 2015-02-17 NOTE — Progress Notes (Signed)
CRITICAL VALUE ALERT  Critical value received:  Aerobic bottle blood culture with gram pos cocci in clusters x2   Date of notification:  02/15/2015  Time of notification:  0479  Critical value read back:Yes.    Nurse who received alert:  Cipriano Mile RN  MD notified (1st page):    Time of first page:    MD notified (2nd page):  Time of second page:  Responding MD:  Time MD responded:

## 2015-02-18 ENCOUNTER — Inpatient Hospital Stay (HOSPITAL_COMMUNITY): Payer: Medicare Other

## 2015-02-18 DIAGNOSIS — R401 Stupor: Secondary | ICD-10-CM

## 2015-02-18 DIAGNOSIS — F10129 Alcohol abuse with intoxication, unspecified: Secondary | ICD-10-CM

## 2015-02-18 DIAGNOSIS — F10120 Alcohol abuse with intoxication, uncomplicated: Secondary | ICD-10-CM

## 2015-02-18 LAB — BASIC METABOLIC PANEL
ANION GAP: 4 — AB (ref 5–15)
BUN: 5 mg/dL — ABNORMAL LOW (ref 6–23)
CALCIUM: 8.4 mg/dL (ref 8.4–10.5)
CO2: 27 mmol/L (ref 19–32)
Chloride: 107 mmol/L (ref 96–112)
Creatinine, Ser: 1.01 mg/dL (ref 0.50–1.35)
GFR, EST NON AFRICAN AMERICAN: 89 mL/min — AB (ref >90)
Glucose, Bld: 120 mg/dL — ABNORMAL HIGH (ref 70–99)
POTASSIUM: 3.6 mmol/L (ref 3.5–5.1)
SODIUM: 138 mmol/L (ref 135–145)

## 2015-02-18 LAB — CBC
HEMATOCRIT: 46.4 % (ref 39.0–52.0)
HEMOGLOBIN: 15.3 g/dL (ref 13.0–17.0)
MCH: 30.8 pg (ref 26.0–34.0)
MCHC: 33 g/dL (ref 30.0–36.0)
MCV: 93.5 fL (ref 78.0–100.0)
Platelets: 250 10*3/uL (ref 150–400)
RBC: 4.96 MIL/uL (ref 4.22–5.81)
RDW: 14.2 % (ref 11.5–15.5)
WBC: 7.1 10*3/uL (ref 4.0–10.5)

## 2015-02-18 LAB — CULTURE, BLOOD (ROUTINE X 2)

## 2015-02-18 MED ORDER — CHLORHEXIDINE GLUCONATE 0.12 % MT SOLN
15.0000 mL | Freq: Two times a day (BID) | OROMUCOSAL | Status: DC
  Administered 2015-02-19 – 2015-03-01 (×18): 15 mL via OROMUCOSAL
  Filled 2015-02-18 (×22): qty 15

## 2015-02-18 MED ORDER — MIDAZOLAM HCL 2 MG/2ML IJ SOLN
2.0000 mg | INTRAMUSCULAR | Status: DC | PRN
  Administered 2015-02-18 – 2015-02-20 (×4): 2 mg via INTRAVENOUS
  Filled 2015-02-18 (×8): qty 2

## 2015-02-18 MED ORDER — CETYLPYRIDINIUM CHLORIDE 0.05 % MT LIQD
7.0000 mL | Freq: Two times a day (BID) | OROMUCOSAL | Status: DC
  Administered 2015-02-19 – 2015-02-26 (×14): 7 mL via OROMUCOSAL

## 2015-02-18 MED ORDER — LORAZEPAM 2 MG/ML IJ SOLN
2.0000 mg | Freq: Four times a day (QID) | INTRAMUSCULAR | Status: DC
  Administered 2015-02-18 – 2015-02-21 (×13): 2 mg via INTRAVENOUS
  Filled 2015-02-18 (×13): qty 1

## 2015-02-18 NOTE — Progress Notes (Signed)
Subjective:  Sedated, on precedex gtt. Withdraws to pain.   Objective:  Filed Vitals:   02/18/15 0400 02/18/15 0500 02/18/15 0600 02/18/15 0700  BP: 154/97 153/97 158/120 165/126  Pulse:      Temp: 97.6 F (36.4 C)     TempSrc: Oral     Resp: 21 24 19 28   Height:      Weight:      SpO2: 95% 86% 91% 96%    General: AA male, sedated, withdraws to pain. In 4-point restraints.  HEENT: PERRL, moist mucus membranes Neck: Supple, no lymphadenopathy or carotid bruits Lungs: Clear to ascultation bilaterally, no wheezes, rales, rhonchi Heart: RRR, no murmurs, gallops, or rubs Abdomen: Soft, non-tender, non-distended, BS + Extremities: No cyanosis, clubbing, or edema. Dark skin lesions on legs.  Neurologic: Sedated, withdraws to pain. Moves all extremities spontaneously.     Recent Labs Lab 02/15/15 0248 02/16/15 0310 02/18/15 0333  HGB 16.5 17.1* 15.3  HCT 48.6 51.0 46.4  WBC 7.4 10.7* 7.1  PLT 262 219 250    Recent Labs Lab 02/13/15 0304 02/14/15 0235 02/15/15 0248 02/16/15 0310 02/17/15 0235 02/18/15 0333  NA 139 138 139 140 138 138  K 4.0 3.9 4.0 3.9 4.2 3.6  CL 110 107 110 109 108 107  CO2 18* 23 22 22 26 27   GLUCOSE 105* 127* 117* 126* 121* 120*  BUN 5* <5* <5* <5* 6 5*  CREATININE 1.08 0.89 0.85 0.92 0.97 1.01  CALCIUM 8.8 8.6 8.4 8.7 8.2* 8.4  MG 1.7 1.8  --   --   --   --   PHOS  --  2.0*  --   --   --   --     Recent Labs Lab 02/11/15 2106 02/11/15 2109 02/12/15 0403 02/13/15 0859 02/13/15 1849  PHART 7.278*  --  7.259* 7.327* 7.422  PCO2ART 41.4  --  47.2* 41.0 33.5*  PO2ART 75.0*  --  48.0* 212.0* 130.0*  HCO3 19.3*  --  20.4 21.3 21.5  TCO2 21 16 21.9 22 22.5  O2SAT 93.0  --  75.3 100.0 98.5    Assessment/Plan:  44 y/o M w/ PMHx of HTN, seizures, asthma, and h/o substance and alcohol abuse. PCCM consulted for management of severe alcohol withdrawal. Patient also w/ Staph bacteremia.   Alcohol Withdrawal w/ Delirium: Some concern for  Wernicke's Encephalopathy. Patient also w/ alcohol withdrawal seizure initially, none seen on EEG. No new findings on MRI. Still agitated when sedation is weaned. On CIWA, precedex gtt.  -Continue Precedex gtt, could max this at 2.0 if needed -Ativan 1 mg IV q12h -CIWA protocol, dc as need MD driven treatments: increase ativan 2 mg IV q6h, add versed 2 mg q2h prn -may need restart depakote -avoid haldol as seizure threshold -Thiamine 371 mg IV daily -Folic acid -D5 1/2 NS + 20 K @ 75 cc/hr -Keppra 500 mg IV bid -Neuro following  Staph Bacteremia: coag neg staph, unclear contam<? -ID consult -Continue Vancomycin IV -ensure repeat BC done  Acute Hypoxic Respiratory Failure: Improved. CXR from 01/17/15 showed mild pulmonary vascular congestion, LLL atelectasis.  -Continue supplemental O2 -Repeat CXR in AM  HTN: BP high this AM. Likely related to agitation/withdrawal.  -Hydralazine prn   STAFF NOTE: I, Merrie Roof, MD FACP have personally reviewed patient's available data, including medical history, events of note, physical examination and test results as part of my evaluation. I have discussed with resident/NP and other care providers such as pharmacist, RN and  RRT. In addition, I personally evaluated patient and elicited key findings of: rass now on exam -2, want to dc CIWA, add ativan scheduled, versed prn, goal to dc precedex, no haldol, CTA lungs, repeat BC, ABX per ID for now The patient is critically ill with multiple organ systems failure and requires high complexity decision making for assessment and support, frequent evaluation and titration of therapies, application of advanced monitoring technologies and extensive interpretation of multiple databases.   Critical Care Time devoted to patient care services described in this note is30 Minutes. This time reflects time of care of this signee: Merrie Roof, MD FACP. This critical care time does not reflect procedure time, or  teaching time or supervisory time of PA/NP/Med student/Med Resident etc but could involve care discussion time. Rest per NP/medical resident whose note is outlined above and that I agree with   Lavon Paganini. Titus Mould, MD, [place] Pgr: [facility] 02/18/2015 10:05 AM

## 2015-02-18 NOTE — Progress Notes (Signed)
eLink Physician-Brief Progress Note Patient Name: Richard [name] DOB: 03/17/1971 MRN: [id number]   Date of Service  02/18/2015  HPI/Events of Note  Pt with increasing o2 needs. On camera eval he is sedated and likely has some degree of hypoventilation. Has been on D50.45NS for 4 days.   eICU Interventions  Will hold IVF, check CXR now. Consider ABG depending on overall trend     Intervention Category Intermediate Interventions: Other:  [name] S. 02/18/2015, 7:17 PM

## 2015-02-18 NOTE — Progress Notes (Signed)
Subjective: patient is more alert today and able to follow commands.   Objective: Current vital signs: BP 108/70 mmHg  Pulse 74  Temp(Src) 97.5 F (36.4 C) (Oral)  Resp 21  Ht 6' (1.829 m)  Wt 97.8 kg (215 lb 9.8 oz)  BMI 29.24 kg/m2  SpO2 90% Vital signs in last 24 hours: Temp:  [94.3 F (34.6 C)-98.2 F (36.8 C)] 97.5 F (36.4 C) (03/07 0800) Pulse Rate:  [63-75] 74 (03/07 0800) Resp:  [12-28] 21 (03/07 1000) BP: (108-172)/(59-126) 108/70 mmHg (03/07 1000) SpO2:  [86 %-97 %] 90 % (03/07 1000)  Intake/Output from previous day: 03/06 0701 - 03/07 0700 In: 2665.6 [I.V.:1865.6; IV Piggyback:800] Out: 2400 [Urine:2400] Intake/Output this shift: Total I/O In: 109.2 [I.V.:109.2] Out: -  Nutritional status: Diet NPO time specified  Neurologic Exam: General:NAD Mental Status: Lethargic, able to follow commands, states he is doing well but is not sure where he is. Cranial Nerves: II: blinks to threat bilaterally, pupils equal, round, reactive to light and accommodation III,IV, VI: continues to stare forward V,VII: face symmetric, facial light touch sensation normal bilaterally VIII: hearing normal bilaterally IX,X: uvula rises symmetrically   Motor: Moving all extremities antigravity both spontaneously and purposefully Sensory: Pinprick and light touch intact throughout, bilaterally Deep Tendon Reflexes:  Right: Upper Extremity   Left: Upper extremity   biceps (C-5 to C-6) 2/4   biceps (C-5 to C-6) 2/4 tricep (C7) 2/4    triceps (C7) 2/4 Brachioradialis (C6) 2/4  Brachioradialis (C6) 2/4  Lower Extremity Lower Extremity  quadriceps (L-2 to L-4) 2/4   quadriceps (L-2 to L-4) 2/4 Achilles (S1) 2/4   Achilles (S1) 2/4  Plantars: Right: downgoing   Left: downgoing    Lab Results: Basic Metabolic Panel:  Recent Labs Lab 02/13/15 0304 02/14/15 0235 02/15/15 0248 02/16/15 0310 02/17/15 0235 02/18/15 0333  NA 139 138 139 140 138 138  K 4.0 3.9 4.0 3.9 4.2  3.6  CL 110 107 110 109 108 107  CO2 18* 23 22 22 26 27   GLUCOSE 105* 127* 117* 126* 121* 120*  BUN 5* <5* <5* <5* 6 5*  CREATININE 1.08 0.89 0.85 0.92 0.97 1.01  CALCIUM 8.8 8.6 8.4 8.7 8.2* 8.4  MG 1.7 1.8  --   --   --   --   PHOS  --  2.0*  --   --   --   --     Liver Function Tests:  Recent Labs Lab 02/12/15 0210 02/13/15 0304 02/14/15 0235 02/15/15 0248 02/16/15 0310  AST 33 39* 30 23 22   ALT 21 33 31 28 30   ALKPHOS 62 86 71 71 66  BILITOT 0.7 1.7* 1.2 1.1 0.9  PROT 6.0 7.1 6.3 5.8* 6.3  ALBUMIN 3.3* 3.6 3.1* 2.8* 2.8*    Recent Labs Lab 02/11/15 2043  LIPASE 68*    Recent Labs Lab 02/12/15 0545  AMMONIA 45*    CBC:  Recent Labs Lab 02/12/15 0210 02/13/15 0304 02/14/15 0235 02/15/15 0248 02/16/15 0310 02/18/15 0333  WBC 6.8 10.9* 11.0* 7.4 10.7* 7.1  NEUTROABS 3.8 8.6* 8.6* 5.4 8.5*  --   HGB 14.6 16.9 16.3 16.5 17.1* 15.3  HCT 43.8 50.5 48.3 48.6 51.0 46.4  MCV 93.0 95.5 93.6 94.0 94.3 93.5  PLT 223 253 213 262 219 250    Cardiac Enzymes:  Recent Labs Lab 02/11/15 2043 02/12/15 0210 02/12/15 0545  CKTOTAL 141  --   --   TROPONINI  --  <0.03 <  0.03    Lipid Panel: No results for input(s): CHOL, TRIG, HDL, CHOLHDL, VLDL, LDLCALC in the last 168 hours.  CBG:  Recent Labs Lab 02/11/15 2102 02/14/15 0809  GLUCAP 45 101*    Microbiology: Results for orders placed or performed during the hospital encounter of 02/11/15  MRSA PCR Screening     Status: None   Collection Time: 02/12/15  1:25 AM  Result Value Ref Range Status   MRSA by PCR NEGATIVE NEGATIVE Final    Comment:        The GeneXpert MRSA Assay (FDA approved for NASAL specimens only), is one component of a comprehensive MRSA colonization surveillance program. It is not intended to diagnose MRSA infection nor to guide or monitor treatment for MRSA infections.   Urine culture     Status: None   Collection Time: 02/12/15  6:16 AM  Result Value Ref Range Status    Specimen Description URINE, RANDOM  Final   Special Requests NONE  Final   Colony Count   Final    75,000 COLONIES/ML Performed at [facility]    Culture   Final    STAPHYLOCOCCUS SPECIES (COAGULASE NEGATIVE) Note: RIFAMPIN AND GENTAMICIN SHOULD NOT BE USED AS SINGLE DRUGS FOR TREATMENT OF STAPH INFECTIONS. Performed at [facility]    Report Status 02/15/2015 FINAL  Final   Organism ID, Bacteria STAPHYLOCOCCUS SPECIES (COAGULASE NEGATIVE)  Final      Susceptibility   Staphylococcus species (coagulase negative) - MIC*    GENTAMICIN <=0.5 SENSITIVE Sensitive     LEVOFLOXACIN <=0.12 SENSITIVE Sensitive     NITROFURANTOIN <=16 SENSITIVE Sensitive     OXACILLIN >=4 RESISTANT Resistant     PENICILLIN RESISTANT      RIFAMPIN <=0.5 SENSITIVE Sensitive     TRIMETH/SULFA <=10 SENSITIVE Sensitive     VANCOMYCIN 1 SENSITIVE Sensitive     TETRACYCLINE <=1 SENSITIVE Sensitive     * STAPHYLOCOCCUS SPECIES (COAGULASE NEGATIVE)  Culture, blood (routine x 2)     Status: None   Collection Time: 02/14/15 10:13 PM  Result Value Ref Range Status   Specimen Description BLOOD LEFT ARM  Final   Special Requests BOTTLES DRAWN AEROBIC ONLY 2CC  Final   Culture   Final    STAPHYLOCOCCUS SPECIES (COAGULASE NEGATIVE) Note: RIFAMPIN AND GENTAMICIN SHOULD NOT BE USED AS SINGLE DRUGS FOR TREATMENT OF STAPH INFECTIONS. This organism DOES NOT demonstrate inducible Clindamycin resistance in vitro. Note: Gram Stain Report Called to,Read Back By and Verified With: [facility] ST[place] 02/16/15 AT 0240 RIDK Performed at [facility]    Report Status 02/18/2015 FINAL  Final   Organism ID, Bacteria STAPHYLOCOCCUS SPECIES (COAGULASE NEGATIVE)  Final      Susceptibility   Staphylococcus species (coagulase negative) - MIC*    CLINDAMYCIN <=0.25 SENSITIVE Sensitive     ERYTHROMYCIN >=8 RESISTANT Resistant     GENTAMICIN <=0.5 SENSITIVE Sensitive     LEVOFLOXACIN <=0.12 SENSITIVE Sensitive      OXACILLIN <=0.25 SENSITIVE Sensitive     PENICILLIN <=0.03 SENSITIVE Sensitive     RIFAMPIN <=0.5 SENSITIVE Sensitive     TRIMETH/SULFA <=10 SENSITIVE Sensitive     VANCOMYCIN 1 SENSITIVE Sensitive     TETRACYCLINE <=1 SENSITIVE Sensitive     * STAPHYLOCOCCUS SPECIES (COAGULASE NEGATIVE)  Culture, blood (routine x 2)     Status: None   Collection Time: 02/14/15 10:24 PM  Result Value Ref Range Status   Specimen Description BLOOD LEFT HAND  Final  Special Requests BOTTLES DRAWN AEROBIC ONLY 2CC  Final   Culture   Final    STAPHYLOCOCCUS SPECIES (COAGULASE NEGATIVE) Note: SUSCEPTIBILITIES PERFORMED ON PREVIOUS CULTURE WITHIN THE LAST 5 DAYS. Note: Gram Stain Report Called to,Read Back By and Verified With: [place] 02/16/15 AT 0445 RIDK Performed at [facility]    Report Status 02/18/2015 FINAL  Final    Coagulation Studies: No results for input(s): LABPROT, INR in the last 72 hours.  Imaging: Dg Chest Port 1 View  02/18/2015   CLINICAL DATA:  Shortness of breath.  Confused.  EXAM: PORTABLE CHEST - 1 VIEW  COMPARISON:  02/15/2015  FINDINGS: Heart is mildly enlarged. There are persistent mild patchy densities at the lung bases bilaterally consistent with edema or infectious infiltrates.  IMPRESSION: Mild edema or infiltrates.   Electronically Signed   By: Nolon Nations M.D.   On: 02/18/2015 11:26    Medications:  Scheduled: . antiseptic oral rinse  7 mL Mouth Rinse BID  . enoxaparin (LOVENOX) injection  40 mg Subcutaneous Q24H  . folic acid  1 mg Intravenous Daily  . Influenza vac split quadrivalent PF  0.5 mL Intramuscular Tomorrow-1000  . levETIRAcetam  500 mg Intravenous Q12H  . LORazepam  2 mg Intravenous Q6H  . pantoprazole (PROTONIX) IV  40 mg Intravenous Q24H  . sodium chloride  3 mL Intravenous Q12H  . thiamine  100 mg Intravenous Daily  . vancomycin  1,000 mg Intravenous Q8H   Continuous: . sodium chloride 250 mL (02/17/15 0853)  . dexmedetomidine 1.2  mcg/kg/hr (02/18/15 0916)  . dextrose 5 % and 0.45 % NaCl with KCl 20 mEq/L 75 mL/hr at 02/18/15 0803   OVF:IEPPIR chloride, hydrALAZINE, metoprolol, midazolam  Assessment/Plan:  44 yo M with EtOH withdrawal. With persistent severe agitation. MRI brain shows no acute findings and prolonged EEG shows no seizure activity. Pateitn has finished three days high dose thiamine.  Still remains concerning for Wernicke's encephalopathy.   Recommend: 1) Continue thiamine  No further recommendations per neurology.  At this time will S/O please call with any questions.       Etta Quill PA-C [facility] [phone]  02/18/2015, 11:38 AM

## 2015-02-18 NOTE — Progress Notes (Signed)
Spoke with eLink about D51/2 NS with 48mEq K running continuously at 75 ml/hr since 02/14/15. Explained that pt is edematous, O2 went from [facility] to 50% Venti mask. Lung sounds are clear bilaterally. eLink RN to speak with MD and will inform 2H RN what new orders will be. Will continue to assess and monitor the pt closely.

## 2015-02-19 ENCOUNTER — Inpatient Hospital Stay (HOSPITAL_COMMUNITY): Payer: Medicare Other

## 2015-02-19 DIAGNOSIS — E43 Unspecified severe protein-calorie malnutrition: Secondary | ICD-10-CM | POA: Insufficient documentation

## 2015-02-19 DIAGNOSIS — I82409 Acute embolism and thrombosis of unspecified deep veins of unspecified lower extremity: Secondary | ICD-10-CM

## 2015-02-19 DIAGNOSIS — J9601 Acute respiratory failure with hypoxia: Secondary | ICD-10-CM

## 2015-02-19 DIAGNOSIS — Z720 Tobacco use: Secondary | ICD-10-CM

## 2015-02-19 HISTORY — DX: Acute embolism and thrombosis of unspecified deep veins of unspecified lower extremity: I82.409

## 2015-02-19 LAB — GLUCOSE, CAPILLARY
Glucose-Capillary: 109 mg/dL — ABNORMAL HIGH (ref 70–99)
Glucose-Capillary: 137 mg/dL — ABNORMAL HIGH (ref 70–99)

## 2015-02-19 LAB — VANCOMYCIN, TROUGH: Vancomycin Tr: 14.1 ug/mL (ref 10.0–20.0)

## 2015-02-19 MED ORDER — PRO-STAT SUGAR FREE PO LIQD
30.0000 mL | Freq: Two times a day (BID) | ORAL | Status: DC
  Administered 2015-02-19 – 2015-02-23 (×10): 30 mL
  Filled 2015-02-19 (×12): qty 30

## 2015-02-19 MED ORDER — RISPERIDONE 1 MG/ML PO SOLN
1.0000 mg | Freq: Two times a day (BID) | ORAL | Status: DC
  Administered 2015-02-19 (×2): 1 mg
  Filled 2015-02-19 (×4): qty 1

## 2015-02-19 MED ORDER — VANCOMYCIN HCL 10 G IV SOLR
1250.0000 mg | Freq: Three times a day (TID) | INTRAVENOUS | Status: DC
  Administered 2015-02-20 – 2015-02-21 (×5): 1250 mg via INTRAVENOUS
  Filled 2015-02-19 (×8): qty 1250

## 2015-02-19 MED ORDER — VITAL HIGH PROTEIN PO LIQD
1000.0000 mL | ORAL | Status: DC
  Filled 2015-02-19 (×2): qty 1000

## 2015-02-19 MED ORDER — CLONIDINE ORAL SUSPENSION 10 MCG/ML
0.2000 mg | Freq: Three times a day (TID) | ORAL | Status: DC
  Administered 2015-02-19 – 2015-02-21 (×6): 0.2 mg via ORAL
  Filled 2015-02-19 (×13): qty 20

## 2015-02-19 MED ORDER — JEVITY 1.2 CAL PO LIQD
1000.0000 mL | ORAL | Status: DC
Start: 2015-02-19 — End: 2015-02-24
  Administered 2015-02-19 – 2015-02-23 (×2): 1000 mL
  Filled 2015-02-19 (×11): qty 1000

## 2015-02-19 NOTE — Progress Notes (Signed)
ANTIBIOTIC CONSULT NOTE  Pharmacy Consult for vancomycin Indication: bacteremia  Allergies  Allergen Reactions  . Dilaudid [Hydromorphone Hcl] Other (See Comments)    Bradycardia and Nausea    Patient Measurements: Height: 6' (182.9 cm) Weight: 210 lb 15.7 oz (95.7 kg) IBW/kg (Calculated) : 77.6   Vital Signs: Temp: 97.9 F (36.6 C) (03/08 1600) Temp Source: Axillary (03/08 1600) BP: 157/105 mmHg (03/08 1800) Intake/Output from previous day: 03/07 0701 - 03/08 0700 In: 2591.3 [I.V.:1791.3; IV Piggyback:800] Out: 3000 [Urine:3000] Intake/Output from this shift:    Labs:  Recent Labs  02/17/15 0235 02/18/15 0333  WBC  --  7.1  HGB  --  15.3  PLT  --  250  CREATININE 0.97 1.01   Estimated Creatinine Clearance: 111.9 mL/min (by C-G formula based on Cr of 1.01).  Recent Labs  02/19/15 1725  [place] 14.1     Microbiology: Recent Results (from the past 720 hour(s))  MRSA PCR Screening     Status: None   Collection Time: 02/12/15  1:25 AM  Result Value Ref Range Status   MRSA by PCR NEGATIVE NEGATIVE Final    Comment:        The GeneXpert MRSA Assay (FDA approved for NASAL specimens only), is one component of a comprehensive MRSA colonization surveillance program. It is not intended to diagnose MRSA infection nor to guide or monitor treatment for MRSA infections.   Urine culture     Status: None   Collection Time: 02/12/15  6:16 AM  Result Value Ref Range Status   Specimen Description URINE, RANDOM  Final   Special Requests NONE  Final   Colony Count   Final    75,000 COLONIES/ML Performed at [facility]    Culture   Final    STAPHYLOCOCCUS SPECIES (COAGULASE NEGATIVE) Note: RIFAMPIN AND GENTAMICIN SHOULD NOT BE USED AS SINGLE DRUGS FOR TREATMENT OF STAPH INFECTIONS. Performed at [facility]    Report Status 02/15/2015 FINAL  Final   Organism ID, Bacteria STAPHYLOCOCCUS SPECIES (COAGULASE NEGATIVE)  Final   Susceptibility   Staphylococcus species (coagulase negative) - MIC*    GENTAMICIN <=0.5 SENSITIVE Sensitive     LEVOFLOXACIN <=0.12 SENSITIVE Sensitive     NITROFURANTOIN <=16 SENSITIVE Sensitive     OXACILLIN >=4 RESISTANT Resistant     PENICILLIN RESISTANT      RIFAMPIN <=0.5 SENSITIVE Sensitive     TRIMETH/SULFA <=10 SENSITIVE Sensitive     VANCOMYCIN 1 SENSITIVE Sensitive     TETRACYCLINE <=1 SENSITIVE Sensitive     * STAPHYLOCOCCUS SPECIES (COAGULASE NEGATIVE)  Culture, blood (routine x 2)     Status: None   Collection Time: 02/14/15 10:13 PM  Result Value Ref Range Status   Specimen Description BLOOD LEFT ARM  Final   Special Requests BOTTLES DRAWN AEROBIC ONLY 2CC  Final   Culture   Final    STAPHYLOCOCCUS SPECIES (COAGULASE NEGATIVE) Note: RIFAMPIN AND GENTAMICIN SHOULD NOT BE USED AS SINGLE DRUGS FOR TREATMENT OF STAPH INFECTIONS. This organism DOES NOT demonstrate inducible Clindamycin resistance in vitro. Note: Gram Stain Report Called to,Read Back By and Verified With: [facility] STOWE 02/16/15 AT 0240 [place] Performed at [facility]    Report Status 02/18/2015 FINAL  Final   Organism ID, Bacteria STAPHYLOCOCCUS SPECIES (COAGULASE NEGATIVE)  Final      Susceptibility   Staphylococcus species (coagulase negative) - MIC*    CLINDAMYCIN <=0.25 SENSITIVE Sensitive     ERYTHROMYCIN >=8 RESISTANT Resistant  GENTAMICIN <=0.5 SENSITIVE Sensitive     LEVOFLOXACIN <=0.12 SENSITIVE Sensitive     OXACILLIN <=0.25 SENSITIVE Sensitive     PENICILLIN <=0.03 SENSITIVE Sensitive     RIFAMPIN <=0.5 SENSITIVE Sensitive     TRIMETH/SULFA <=10 SENSITIVE Sensitive     VANCOMYCIN 1 SENSITIVE Sensitive     TETRACYCLINE <=1 SENSITIVE Sensitive     * STAPHYLOCOCCUS SPECIES (COAGULASE NEGATIVE)  Culture, blood (routine x 2)     Status: None   Collection Time: 02/14/15 10:24 PM  Result Value Ref Range Status   Specimen Description BLOOD LEFT HAND  Final   Special Requests BOTTLES DRAWN  AEROBIC ONLY 2CC  Final   Culture   Final    STAPHYLOCOCCUS SPECIES (COAGULASE NEGATIVE) Note: SUSCEPTIBILITIES PERFORMED ON PREVIOUS CULTURE WITHIN THE LAST 5 DAYS. Note: Gram Stain Report Called to,Read Back By and Verified With: [facility] STOWE 02/16/15 AT 0445 RIDK Performed at [facility]    Report Status 02/18/2015 FINAL  Final  Culture, blood (routine x 2)     Status: None (Preliminary result)   Collection Time: 02/18/15 12:05 PM  Result Value Ref Range Status   Specimen Description BLOOD RIGHT HAND FINGER  Final   Special Requests BOTTLES DRAWN AEROBIC ONLY 1CC  Final   Culture   Final           BLOOD CULTURE RECEIVED NO GROWTH TO DATE CULTURE WILL BE HELD FOR 5 DAYS BEFORE ISSUING A FINAL NEGATIVE REPORT Performed at [facility]    Report Status PENDING  Incomplete  Culture, blood (routine x 2)     Status: None (Preliminary result)   Collection Time: 02/18/15 12:10 PM  Result Value Ref Range Status   Specimen Description BLOOD RIGHT HAND  Final   Special Requests BOTTLES DRAWN AEROBIC ONLY 1CC  Final   Culture   Final           BLOOD CULTURE RECEIVED NO GROWTH TO DATE CULTURE WILL BE HELD FOR 5 DAYS BEFORE ISSUING A FINAL NEGATIVE REPORT Performed at [facility]    Report Status PENDING  Incomplete   Assessment: 44 yo male here with ETOH withdrawal and now with bacteremia. 2/2 blood cultures and urine cultures positive for CoNS. WBC now WNL. Vancomycin trough this evening was just below goal at 86mcg/mL.  SCr 1.01, stable.  Goal of Therapy:  Vancomycin trough level 15-20 mcg/ml  Plan:  -incr Vancomycin to 1250mg  IV q8h with next dose -Will follow renal function, cultures and clinical progress -follow up repeat cultures (NGTD), and changing of abx  Beacher Every D. Mercia Dowe, PharmD, BCPS Clinical Pharmacist Pager: [phone] 02/19/2015 7:21 PM

## 2015-02-19 NOTE — Progress Notes (Signed)
Bilateral lower extremity venous duplex completed.  Right:  DVT noted in the proximal posterior tibial vein.  No evidence of superficial thrombosis.  No Baker's cyst.  Left:  No evidence of DVT, superficial thrombosis, or Baker's cyst.

## 2015-02-19 NOTE — Progress Notes (Signed)
Subjective:  Agitated this AM, on Precedex gtt @ 1.6 cc/hr. Increased O2 demand overnight. CXR w/ improved pulmonary edema.   Objective:  Filed Vitals:   02/19/15 0400 02/19/15 0500 02/19/15 0600 02/19/15 0700  BP: 141/111 151/101 159/105 174/120  Pulse: 82     Temp: 98.1 F (36.7 C)     TempSrc: Oral     Resp: 14 24 26 21   Height:      Weight:      SpO2: 88% 90% 89% 91%    General: AA male, intermittently sedated, agitated. In restraints. NRB mask in place.  HEENT: PERRL, moist mucus membranes.  Neck: Supple, no lymphadenopathy or carotid bruits Lungs: Clear to ascultation bilaterally, no wheezes, rales, rhonchi Heart: RRR, no murmurs, gallops, or rubs Abdomen: Soft, non-tender, non-distended, BS + Extremities: No cyanosis, clubbing, or edema.  Neurologic: Intermittent sedation/agitation. Not following commands. Moves all extremities spontaneously.     Recent Labs Lab 02/15/15 0248 02/16/15 0310 02/18/15 0333  HGB 16.5 17.1* 15.3  HCT 48.6 51.0 46.4  WBC 7.4 10.7* 7.1  PLT 262 219 250    Recent Labs Lab 02/13/15 0304 02/14/15 0235 02/15/15 0248 02/16/15 0310 02/17/15 0235 02/18/15 0333  NA 139 138 139 140 138 138  K 4.0 3.9 4.0 3.9 4.2 3.6  CL 110 107 110 109 108 107  CO2 18* 23 22 22 26 27   GLUCOSE 105* 127* 117* 126* 121* 120*  BUN 5* <5* <5* <5* 6 5*  CREATININE 1.08 0.89 0.85 0.92 0.97 1.01  CALCIUM 8.8 8.6 8.4 8.7 8.2* 8.4  MG 1.7 1.8  --   --   --   --   PHOS  --  2.0*  --   --   --   --     Recent Labs Lab 02/13/15 0859 02/13/15 1849  PHART 7.327* 7.422  PCO2ART 41.0 33.5*  PO2ART 212.0* 130.0*  HCO3 21.3 21.5  TCO2 22 22.5  O2SAT 100.0 98.5    Assessment/Plan:  44 y/o M w/ PMHx of HTN, seizures, asthma, and h/o substance and alcohol abuse. PCCM consulted for management of severe alcohol withdrawal. Patient also w/ CoNS staph bacteremia.   Alcohol Withdrawal w/ Delirium: Concern for Wernicke's Encephalopathy. Patient also w/ alcohol  withdrawal seizure initially, none seen on EEG. No new findings on MRI. Still agitated. On precedex gtt.  -Continue Precedex gtt, could max this at 2.0 if needed -Ativan 2 mg q6h -Versed 2 mg q2h prn for agitation -Avoid Haldol d/t increased seizure threshold -Thiamine 737 mg IV daily -Folic acid -Continue Keppra 500 mg IV bid -place NGT, start Risperdal -clonidine addition with NGT, patch if unable   Staph Bacteremia: CoNS 2/2 blood cultures; contaminant?  -Continue Vancomycin -Repeat blood cultures x2 pending, if in am neg, dc vanc  Acute Hypoxic Respiratory Failure: Increased O2 requirements overnight. Repeat CXR showed improved aeration of the lungs. Likely related to sedation, ATX -Continue supplemental O2 -Repeat CXR in AM -IS if able  HTN: BP high intermittently high related to agitation/withdrawal.  -Hydralazine IV prn  -Lopressor IV prn for tachycardia -add clonidine with NGT placement  Natasha Bence, MD PGY-2, Internal Medicine Pager: [phone]  STAFF NOTE: Linwood Dibbles, MD FACP have personally reviewed patient's available data, including medical history, events of note, physical examination and test results as part of my evaluation. I have discussed with resident/NP and other care providers such as pharmacist, RN and RRT. In addition, I personally evaluated patient and elicited key findings of:  still remains agitation on precedex 1.6, continued ativan, prn versed, may need to increase ativan further, some concerns, doppler legs, hypoxia unexplained, add Risperdal start feeds  The patient is critically ill with multiple organ systems failure and requires high complexity decision making for assessment and support, frequent evaluation and titration of therapies, application of advanced monitoring technologies and extensive interpretation of multiple databases.   Critical Care Time devoted to patient care services described in this note is30 Minutes. This time reflects  time of care of this signee: Merrie Roof, MD FACP. This critical care time does not reflect procedure time, or teaching time or supervisory time of PA/NP/Med student/Med Resident etc but could involve care discussion time. Rest per NP/medical resident whose note is outlined above and that I agree with   Lavon Paganini. Titus Mould, MD, [place] Pgr: [facility] 02/19/2015 10:22 AM

## 2015-02-19 NOTE — Progress Notes (Signed)
INITIAL NUTRITION ASSESSMENT  DOCUMENTATION CODES Per approved criteria  -Severe malnutrition in the context of acute illness or injury   INTERVENTION: Initiate Jevity 1.2 @ 20 ml/hr via NG tube and increase by 10 ml every 12 hours to goal rate of 70 ml/hr.   30 ml Prostat BID.    Tube feeding regimen provides 2316 kcal, 138 grams of protein, and 1377 ml of H2O.   Recommend onitor magnesium, potassium, and phosphorus daily for at least 3 days, MD to replete as needed, as pt is at risk for refeeding syndrome given severe acute malnutrition.  NUTRITION DIAGNOSIS: Malnutrition related to acute illness as evidenced by 7% weight loss x 8 days and intake of </= 50% of their needs for >/= 5 days. (NPO x 8 days)   Goal: Pt to meet >/= 90% of their estimated nutrition needs   Monitor:  TF tolerance/adequacy, labs  Reason for Assessment: Consult received to initiate and manage enteral nutrition support.  ASSESSMENT: Pt with hx of ETOH and polysubstance abuse admitted with encephalopathy and alcohol intoxication. Suspect poor diet PTA.  Pt transferred to ICU 3/2 has remained NPO so far this admission (8 days).  Pt has remained agitated, in restraints, NRB mask in place due low O2 levels overnight. Concern for Wernicke's Encephalopathy on thiamine.  NG placed to start TF and Risperdal. Per x-ray tip of tube is at the distal esophagus, MD recommends advancement of tube.  Per RN tube advanced and waiting on follow up xray.   Pt yelling out despite sedation medications he remains restrained. Pt asked me to remove the tube from his nose but otherwise unable to understand any other comments.  No signs of fat or muscle depletion noted on exam.   Height: Ht Readings from Last 1 Encounters:  02/11/15 6' (1.829 m)    Weight: Wt Readings from Last 1 Encounters:  02/19/15 210 lb 15.7 oz (95.7 kg)  Admission weight: 225 lb (102 kg) 2/29  Ideal Body Weight: 80.9 kg   % Ideal Body Weight:  118%  Wt Readings from Last 10 Encounters:  02/19/15 210 lb 15.7 oz (95.7 kg)  12/27/14 225 lb (102.059 kg)  07/30/14 195 lb (88.451 kg)  03/19/14 225 lb (102.059 kg)  01/18/14 220 lb (99.791 kg)  12/30/13 225 lb (102.059 kg)  12/24/13 226 lb (102.513 kg)  12/22/13 226 lb (102.513 kg)  12/22/13 226 lb (102.513 kg)  12/05/13 225 lb 6.4 oz (102.241 kg)    Usual Body Weight: 225 lb   % Usual Body Weight: 93%  BMI:  Body mass index is 28.61 kg/(m^2).  Estimated Nutritional Needs: Kcal: 2300-2500 Protein: 115-130 grams Fluid: > 2.3 L/day  Skin: abrasion  Diet Order: Diet NPO time specified  EDUCATION NEEDS: -No education needs identified at this time   Intake/Output Summary (Last 24 hours) at 02/19/15 1427 Last data filed at 02/19/15 1200  Gross per 24 hour  Intake 2354.8 ml  Output   3325 ml  Net -970.2 ml    Last BM: none documented   Labs:   Recent Labs Lab 02/13/15 0304 02/14/15 0235  02/16/15 0310 02/17/15 0235 02/18/15 0333  NA 139 138  < > 140 138 138  K 4.0 3.9  < > 3.9 4.2 3.6  CL 110 107  < > 109 108 107  CO2 18* 23  < > 22 26 27   BUN 5* <5*  < > <5* 6 5*  CREATININE 1.08 0.89  < > 0.92 0.97  1.01  CALCIUM 8.8 8.6  < > 8.7 8.2* 8.4  MG 1.7 1.8  --   --   --   --   PHOS  --  2.0*  --   --   --   --   GLUCOSE 105* 127*  < > 126* 121* 120*  < > = values in this interval not displayed.  CBG (last 3)  No results for input(s): GLUCAP in the last 72 hours.  Scheduled Meds: . antiseptic oral rinse  7 mL Mouth Rinse q12n4p  . chlorhexidine  15 mL Mouth Rinse BID  . cloNIDine  0.2 mg Oral TID  . enoxaparin (LOVENOX) injection  40 mg Subcutaneous Q24H  . feeding supplement (VITAL HIGH PROTEIN)  1,000 mL Per Tube Q24H  . folic acid  1 mg Intravenous Daily  . Influenza vac split quadrivalent PF  0.5 mL Intramuscular Tomorrow-1000  . levETIRAcetam  500 mg Intravenous Q12H  . LORazepam  2 mg Intravenous Q6H  . pantoprazole (PROTONIX) IV  40 mg  Intravenous Q24H  . risperiDONE  1 mg Per Tube BID  . sodium chloride  3 mL Intravenous Q12H  . thiamine  100 mg Intravenous Daily  . vancomycin  1,000 mg Intravenous Q8H    Continuous Infusions: . sodium chloride 10 mL/hr at 02/19/15 1200  . dexmedetomidine 1.7 mcg/kg/hr (02/19/15 1207)    Past Medical History  Diagnosis Date  . Hypertension   . Seizures     last episode 03/2013  . Asthma   . Brain bleed     Past Surgical History  Procedure Laterality Date  . Leg surgery      [place], [place], [place] Pager [phone] After Hours Pager

## 2015-02-20 ENCOUNTER — Inpatient Hospital Stay (HOSPITAL_COMMUNITY): Payer: Medicare Other

## 2015-02-20 LAB — GLUCOSE, CAPILLARY
GLUCOSE-CAPILLARY: 109 mg/dL — AB (ref 70–99)
Glucose-Capillary: 119 mg/dL — ABNORMAL HIGH (ref 70–99)
Glucose-Capillary: 135 mg/dL — ABNORMAL HIGH (ref 70–99)
Glucose-Capillary: 113 mg/dL — ABNORMAL HIGH (ref 70–99)
Glucose-Capillary: 112 mg/dL — ABNORMAL HIGH (ref 70–99)

## 2015-02-20 MED ORDER — RISPERIDONE 1 MG/ML PO SOLN
2.0000 mg | Freq: Two times a day (BID) | ORAL | Status: DC
  Administered 2015-02-20 – 2015-02-22 (×5): 2 mg
  Filled 2015-02-20 (×7): qty 2

## 2015-02-20 MED ORDER — RISPERIDONE 1 MG/ML PO SOLN
1.0000 mg | Freq: Once | ORAL | Status: AC
  Administered 2015-02-20: 1 mg
  Filled 2015-02-20: qty 1

## 2015-02-20 MED ORDER — ENOXAPARIN SODIUM 100 MG/ML ~~LOC~~ SOLN
100.0000 mg | Freq: Two times a day (BID) | SUBCUTANEOUS | Status: DC
  Administered 2015-02-20 – 2015-02-21 (×4): 100 mg via SUBCUTANEOUS
  Filled 2015-02-20 (×6): qty 1

## 2015-02-20 MED ORDER — FUROSEMIDE 10 MG/ML IJ SOLN
20.0000 mg | Freq: Two times a day (BID) | INTRAMUSCULAR | Status: DC
  Administered 2015-02-20 – 2015-02-22 (×5): 20 mg via INTRAVENOUS
  Filled 2015-02-20 (×9): qty 2

## 2015-02-20 MED ORDER — POTASSIUM CHLORIDE 20 MEQ/15ML (10%) PO SOLN
40.0000 meq | Freq: Once | ORAL | Status: AC
  Administered 2015-02-20: 40 meq via ORAL
  Filled 2015-02-20 (×2): qty 30

## 2015-02-20 NOTE — Progress Notes (Signed)
ANTICOAGULATION CONSULT NOTE - Initial Consult  Pharmacy Consult for Lovenox Indication: DVT  Allergies  Allergen Reactions  . Dilaudid [Hydromorphone Hcl] Other (See Comments)    Bradycardia and Nausea    Patient Measurements: Height: 6' (182.9 cm) Weight: 212 lb (96.163 kg) IBW/kg (Calculated) : 77.6  Vital Signs: Temp: 96.8 F (36 C) (03/09 0400) Temp Source: Axillary (03/09 0400) BP: 179/118 mmHg (03/09 0700)  Labs:  Recent Labs  02/18/15 0333  HGB 15.3  HCT 46.4  PLT 250  CREATININE 1.01    Estimated Creatinine Clearance: 112.2 mL/min (by C-G formula based on Cr of 1.01).   Medical History: Past Medical History  Diagnosis Date  . Hypertension   . Seizures     last episode 03/2013  . Asthma   . Brain bleed     Medications:  Scheduled:  . antiseptic oral rinse  7 mL Mouth Rinse q12n4p  . chlorhexidine  15 mL Mouth Rinse BID  . cloNIDine  0.2 mg Oral TID  . enoxaparin (LOVENOX) injection  40 mg Subcutaneous Q24H  . feeding supplement (PRO-STAT SUGAR FREE 64)  30 mL Per Tube BID  . folic acid  1 mg Intravenous Daily  . Influenza vac split quadrivalent PF  0.5 mL Intramuscular Tomorrow-1000  . levETIRAcetam  500 mg Intravenous Q12H  . LORazepam  2 mg Intravenous Q6H  . pantoprazole (PROTONIX) IV  40 mg Intravenous Q24H  . risperiDONE  1 mg Per Tube BID  . sodium chloride  3 mL Intravenous Q12H  . thiamine  100 mg Intravenous Daily  . vancomycin  1,250 mg Intravenous Q8H    Assessment: 44 yo male with RLE DVT for Lovenox Goal of Therapy:  Full anticoagulation with Lovenox Monitor platelets by anticoagulation protocol: Yes   Plan:  Lovenox 100 mg SQ q12h  Caryl Pina 02/20/2015,7:24 AM

## 2015-02-20 NOTE — Progress Notes (Signed)
Subjective:  No overnight events. Mumbling this AM, does not follow commands. In restraints.   Objective:  Filed Vitals:   02/20/15 0300 02/20/15 0400 02/20/15 0500 02/20/15 0600  BP: 165/113 158/111 161/116 139/114  Pulse:      Temp:  96.8 F (36 C)    TempSrc:  Axillary    Resp: 17 12 20 19   Height:      Weight:   212 lb (96.163 kg)   SpO2: 92% 93% 91% 89%    General: AA male, intermittently sedated, agitated. In restraints. NRB mask in place.  HEENT: PERRL, moist mucus membranes. Mild facial edema? NGT in right nare.  Neck: Supple, no lymphadenopathy or carotid bruits Lungs: Clear to ascultation bilaterally, no wheezes, rales, rhonchi Heart: RRR, no murmurs, gallops, or rubs Abdomen: Soft, non-tender, non-distended, BS + Extremities: RLE slightly warm to the touch. No obvious edema. Scattered skin lesions on LE's Neurologic: Intermittent sedation/agitation. Not following commands. Moves all extremities spontaneously.     Recent Labs Lab 02/15/15 0248 02/16/15 0310 02/18/15 0333  HGB 16.5 17.1* 15.3  HCT 48.6 51.0 46.4  WBC 7.4 10.7* 7.1  PLT 262 219 250    Recent Labs Lab 02/14/15 0235 02/15/15 0248 02/16/15 0310 02/17/15 0235 02/18/15 0333  NA 138 139 140 138 138  K 3.9 4.0 3.9 4.2 3.6  CL 107 110 109 108 107  CO2 23 22 22 26 27   GLUCOSE 127* 117* 126* 121* 120*  BUN <5* <5* <5* 6 5*  CREATININE 0.89 0.85 0.92 0.97 1.01  CALCIUM 8.6 8.4 8.7 8.2* 8.4  MG 1.8  --   --   --   --   PHOS 2.0*  --   --   --   --     Recent Labs Lab 02/13/15 0859 02/13/15 1849  PHART 7.327* 7.422  PCO2ART 41.0 33.5*  PO2ART 212.0* 130.0*  HCO3 21.3 21.5  TCO2 22 22.5  O2SAT 100.0 98.5    Assessment/Plan:  44 y/o M w/ PMHx of HTN, seizures, asthma, and h/o substance and alcohol abuse. PCCM consulted for management of severe alcohol withdrawal.   Alcohol Withdrawal w/ Delirium: Concern for Wernicke's Encephalopathy. Patient also w/ alcohol withdrawal seizure  initially, none seen on EEG. No new findings on MRI. Still agitated requiring Precedex gtt despite scheduled Ativan and Risperdal.  -Continue Precedex gtt, goal to reduce as we increase orals -Ativan 2 mg q6h -Versed 2 mg q2h prn for agitation -Continue Risperdal but increase to 2 mg bid -Avoid Haldol d/t increased seizure threshold -Thiamine 951 mg IV daily -Folic acid -Continue Keppra 500 mg IV bid -Continue tube feeds -may need Depakote to get off precedex, would use in am if not successful  Staph Bacteremia: CoNS 2/2 blood cultures; contaminant? Repeat blood cultures continue to be negative.  -Discontinue Vancomycin, follow fever curve  Acute Hypoxic Respiratory Failure: Increased O2 requirements overnight on 02/18/15. Repeat CXR showed improved aeration of the lungs. LE doppler yesterday showed DVT in right posterior tibial vein.  Presume PE -Continue supplemental O2 -Repeat CXR - may need lasix -Treat for DVT/PE; start Lovenox 1 mg/kg q12h   DVT: Right posterior tibial vein. Consider provoked in the setting of prolonged hospitalization.  -Lovenox per pharmacy -Will need anticoagulation on discharge.   HTN: BP high intermittently high related to agitation/withdrawal.  -Clonidine per tube; 0.2 mg tid -Hydralazine IV prn  -Lopressor IV prn for tachycardia    Natasha Bence, MD PGY-2, Internal Medicine Pager: [phone]  STAFF  NOTE: I, Merrie Roof, MD FACP have personally reviewed patient's available data, including medical history, events of note, physical examination and test results as part of my evaluation. I have discussed with resident/NP and other care providers such as pharmacist, RN and RRT. In addition, I personally evaluated patient and elicited key findings of: DVt pos, presume PE, lovenox start, double rispirdal, likley to add depakote for refractory delirium  In am if not improved and precedex reduced, continued benzo, lasix, less agitated today, ronchi course at  times The patient is critically ill with multiple organ systems failure and requires high complexity decision making for assessment and support, frequent evaluation and titration of therapies, application of advanced monitoring technologies and extensive interpretation of multiple databases.   Critical Care Time devoted to patient care services described in this note is30 Minutes. This time reflects time of care of this signee: Merrie Roof, MD FACP. This critical care time does not reflect procedure time, or teaching time or supervisory time of PA/NP/Med student/Med Resident etc but could involve care discussion time. Rest per NP/medical resident whose note is outlined above and that I agree with   Lavon Paganini. Titus Mould, MD, [place] Pgr: [facility] 02/20/2015 10:04 AM

## 2015-02-20 NOTE — Significant Event (Signed)
Patient desaturated to 87-88% on 55% venturi mask, patient not responding to painful stimulation during this time, not moaning as he had done during the day. Precedex cut down drastically. Patient placed on 100% nonrebreathing mask at this time, saturations 93%, patient opening eyes to verbal commands, moaning slightly. Continuing to decrease precedex. Will continue to monitor closely. Aislynn Cifelli, Therapist, sports

## 2015-02-21 DIAGNOSIS — R7881 Bacteremia: Secondary | ICD-10-CM

## 2015-02-21 DIAGNOSIS — B958 Unspecified staphylococcus as the cause of diseases classified elsewhere: Secondary | ICD-10-CM

## 2015-02-21 DIAGNOSIS — E43 Unspecified severe protein-calorie malnutrition: Secondary | ICD-10-CM

## 2015-02-21 DIAGNOSIS — I10 Essential (primary) hypertension: Secondary | ICD-10-CM

## 2015-02-21 LAB — COMPREHENSIVE METABOLIC PANEL
ALBUMIN: 2.6 g/dL — AB (ref 3.5–5.2)
ALT: 21 U/L (ref 0–53)
AST: 20 U/L (ref 0–37)
Alkaline Phosphatase: 65 U/L (ref 39–117)
Anion gap: 4 — ABNORMAL LOW (ref 5–15)
BUN: 15 mg/dL (ref 6–23)
CALCIUM: 8.6 mg/dL (ref 8.4–10.5)
CO2: 28 mmol/L (ref 19–32)
Chloride: 107 mmol/L (ref 96–112)
Creatinine, Ser: 0.95 mg/dL (ref 0.50–1.35)
GFR calc Af Amer: >90 mL/min (ref >90)
GFR calc non Af Amer: >90 mL/min (ref >90)
Glucose, Bld: 123 mg/dL — ABNORMAL HIGH (ref 70–99)
Potassium: 3.5 mmol/L (ref 3.5–5.1)
SODIUM: 139 mmol/L (ref 135–145)
Total Bilirubin: 0.6 mg/dL (ref 0.3–1.2)
Total Protein: 6.2 g/dL (ref 6.0–8.3)

## 2015-02-21 LAB — GLUCOSE, CAPILLARY
GLUCOSE-CAPILLARY: 109 mg/dL — AB (ref 70–99)
GLUCOSE-CAPILLARY: 124 mg/dL — AB (ref 70–99)
Glucose-Capillary: 110 mg/dL — ABNORMAL HIGH (ref 70–99)
Glucose-Capillary: 122 mg/dL — ABNORMAL HIGH (ref 70–99)
Glucose-Capillary: 126 mg/dL — ABNORMAL HIGH (ref 70–99)
Glucose-Capillary: 93 mg/dL (ref 70–99)

## 2015-02-21 LAB — CBC
HCT: 47.8 % (ref 39.0–52.0)
Hemoglobin: 16.3 g/dL (ref 13.0–17.0)
MCH: 32.3 pg (ref 26.0–34.0)
MCHC: 34.1 g/dL (ref 30.0–36.0)
MCV: 94.7 fL (ref 78.0–100.0)
Platelets: 291 10*3/uL (ref 150–400)
RBC: 5.05 MIL/uL (ref 4.22–5.81)
RDW: 13.9 % (ref 11.5–15.5)
WBC: 9.3 10*3/uL (ref 4.0–10.5)

## 2015-02-21 LAB — MAGNESIUM: Magnesium: 1.8 mg/dL (ref 1.5–2.5)

## 2015-02-21 MED ORDER — VITAMIN B-1 100 MG PO TABS
100.0000 mg | ORAL_TABLET | Freq: Every day | ORAL | Status: DC
  Administered 2015-02-22 – 2015-03-01 (×8): 100 mg via ORAL
  Filled 2015-02-21 (×8): qty 1

## 2015-02-21 MED ORDER — LEVETIRACETAM 100 MG/ML PO SOLN
500.0000 mg | Freq: Two times a day (BID) | ORAL | Status: DC
  Administered 2015-02-21 – 2015-02-24 (×8): 500 mg
  Filled 2015-02-21 (×11): qty 5

## 2015-02-21 MED ORDER — LORAZEPAM 2 MG/ML IJ SOLN
2.0000 mg | INTRAMUSCULAR | Status: DC | PRN
  Administered 2015-02-21 – 2015-02-22 (×5): 2 mg via INTRAVENOUS
  Filled 2015-02-21 (×5): qty 1

## 2015-02-21 MED ORDER — CEFAZOLIN SODIUM-DEXTROSE 2-3 GM-% IV SOLR
2.0000 g | Freq: Three times a day (TID) | INTRAVENOUS | Status: DC
  Administered 2015-02-21 – 2015-03-01 (×24): 2 g via INTRAVENOUS
  Filled 2015-02-21 (×28): qty 50

## 2015-02-21 MED ORDER — CLONIDINE ORAL SUSPENSION 10 MCG/ML
0.2000 mg | Freq: Three times a day (TID) | ORAL | Status: DC
  Administered 2015-02-21: 0.2 mg
  Filled 2015-02-21 (×8): qty 20

## 2015-02-21 MED ORDER — CLONAZEPAM 0.1 MG/ML ORAL SUSPENSION
1.0000 mg | Freq: Three times a day (TID) | ORAL | Status: DC
  Filled 2015-02-21 (×3): qty 10

## 2015-02-21 MED ORDER — PANTOPRAZOLE SODIUM 40 MG PO PACK
40.0000 mg | PACK | Freq: Every day | ORAL | Status: DC
  Administered 2015-02-21 – 2015-02-23 (×3): 40 mg
  Filled 2015-02-21 (×5): qty 20

## 2015-02-21 MED ORDER — FOLIC ACID 1 MG PO TABS
1.0000 mg | ORAL_TABLET | Freq: Every day | ORAL | Status: DC
  Administered 2015-02-22 – 2015-02-24 (×3): 1 mg
  Filled 2015-02-21 (×4): qty 1

## 2015-02-21 MED ORDER — CLONAZEPAM 0.5 MG PO TABS
1.0000 mg | ORAL_TABLET | Freq: Three times a day (TID) | ORAL | Status: DC
  Administered 2015-02-21 – 2015-02-22 (×5): 1 mg
  Filled 2015-02-21 (×4): qty 2

## 2015-02-21 MED ORDER — FOLIC ACID 1 MG PO TABS: 1.0000 mg | ORAL_TABLET | Freq: Every day | ORAL | Status: DC

## 2015-02-21 NOTE — Progress Notes (Signed)
UR Completed.  [phone]  

## 2015-02-21 NOTE — Progress Notes (Signed)
ANTIBIOTIC CONSULT NOTE - INITIAL  Pharmacy Consult:  Ancef Indication:  CoNS bacteremia  Allergies  Allergen Reactions  . Dilaudid [Hydromorphone Hcl] Other (See Comments)    Bradycardia and Nausea    Patient Measurements: Height: 6' (182.9 cm) Weight: 209 lb 7 oz (95 kg) IBW/kg (Calculated) : 77.6  Vital Signs: Temp: 97.2 F (36.2 C) (03/10 0825) Temp Source: Oral (03/10 0825) BP: 138/92 mmHg (03/10 0825) Pulse Rate: 76 (03/10 0825) Intake/Output from previous day: 03/09 0701 - 03/10 0700 In: 1528.1 [I.V.:535.6; NG/GT:392.5; IV Piggyback:600] Out: 6010 [Urine:3540] Intake/Output from this shift: Total I/O In: -  Out: 650 [Urine:650]  Labs:  Recent Labs  02/21/15 0657  WBC 9.3  HGB 16.3  PLT 291  CREATININE 0.95   Estimated Creatinine Clearance: 118.7 mL/min (by C-G formula based on Cr of 0.95).  Recent Labs  02/19/15 1725  [place] 14.1     Microbiology: Recent Results (from the past 720 hour(s))  MRSA PCR Screening     Status: None   Collection Time: 02/12/15  1:25 AM  Result Value Ref Range Status   MRSA by PCR NEGATIVE NEGATIVE Final    Comment:        The GeneXpert MRSA Assay (FDA approved for NASAL specimens only), is one component of a comprehensive MRSA colonization surveillance program. It is not intended to diagnose MRSA infection nor to guide or monitor treatment for MRSA infections.   Urine culture     Status: None   Collection Time: 02/12/15  6:16 AM  Result Value Ref Range Status   Specimen Description URINE, RANDOM  Final   Special Requests NONE  Final   Colony Count   Final    75,000 COLONIES/ML Performed at [facility]    Culture   Final    STAPHYLOCOCCUS SPECIES (COAGULASE NEGATIVE) Note: RIFAMPIN AND GENTAMICIN SHOULD NOT BE USED AS SINGLE DRUGS FOR TREATMENT OF STAPH INFECTIONS. Performed at [facility]    Report Status 02/15/2015 FINAL  Final   Organism ID, Bacteria STAPHYLOCOCCUS SPECIES  (COAGULASE NEGATIVE)  Final      Susceptibility   Staphylococcus species (coagulase negative) - MIC*    GENTAMICIN <=0.5 SENSITIVE Sensitive     LEVOFLOXACIN <=0.12 SENSITIVE Sensitive     NITROFURANTOIN <=16 SENSITIVE Sensitive     OXACILLIN >=4 RESISTANT Resistant     PENICILLIN RESISTANT      RIFAMPIN <=0.5 SENSITIVE Sensitive     TRIMETH/SULFA <=10 SENSITIVE Sensitive     VANCOMYCIN 1 SENSITIVE Sensitive     TETRACYCLINE <=1 SENSITIVE Sensitive     * STAPHYLOCOCCUS SPECIES (COAGULASE NEGATIVE)  Culture, blood (routine x 2)     Status: None   Collection Time: 02/14/15 10:13 PM  Result Value Ref Range Status   Specimen Description BLOOD LEFT ARM  Final   Special Requests BOTTLES DRAWN AEROBIC ONLY 2CC  Final   Culture   Final    STAPHYLOCOCCUS SPECIES (COAGULASE NEGATIVE) Note: RIFAMPIN AND GENTAMICIN SHOULD NOT BE USED AS SINGLE DRUGS FOR TREATMENT OF STAPH INFECTIONS. This organism DOES NOT demonstrate inducible Clindamycin resistance in vitro. Note: Gram Stain Report Called to,Read Back By and Verified With: [facility] STOWE 02/16/15 AT 0240 Millen Performed at [facility]    Report Status 02/18/2015 FINAL  Final   Organism ID, Bacteria STAPHYLOCOCCUS SPECIES (COAGULASE NEGATIVE)  Final      Susceptibility   Staphylococcus species (coagulase negative) - MIC*    CLINDAMYCIN <=0.25 SENSITIVE Sensitive  ERYTHROMYCIN >=8 RESISTANT Resistant     GENTAMICIN <=0.5 SENSITIVE Sensitive     LEVOFLOXACIN <=0.12 SENSITIVE Sensitive     OXACILLIN <=0.25 SENSITIVE Sensitive     PENICILLIN <=0.03 SENSITIVE Sensitive     RIFAMPIN <=0.5 SENSITIVE Sensitive     TRIMETH/SULFA <=10 SENSITIVE Sensitive     VANCOMYCIN 1 SENSITIVE Sensitive     TETRACYCLINE <=1 SENSITIVE Sensitive     * STAPHYLOCOCCUS SPECIES (COAGULASE NEGATIVE)  Culture, blood (routine x 2)     Status: None   Collection Time: 02/14/15 10:24 PM  Result Value Ref Range Status   Specimen Description BLOOD LEFT HAND  Final    Special Requests BOTTLES DRAWN AEROBIC ONLY 2CC  Final   Culture   Final    STAPHYLOCOCCUS SPECIES (COAGULASE NEGATIVE) Note: SUSCEPTIBILITIES PERFORMED ON PREVIOUS CULTURE WITHIN THE LAST 5 DAYS. Note: Gram Stain Report Called to,Read Back By and Verified With: [facility] STOWE 02/16/15 AT 0445 RIDK Performed at [facility]    Report Status 02/18/2015 FINAL  Final  Culture, blood (routine x 2)     Status: None (Preliminary result)   Collection Time: 02/18/15 12:05 PM  Result Value Ref Range Status   Specimen Description BLOOD RIGHT HAND FINGER  Final   Special Requests BOTTLES DRAWN AEROBIC ONLY 1CC  Final   Culture   Final           BLOOD CULTURE RECEIVED NO GROWTH TO DATE CULTURE WILL BE HELD FOR 5 DAYS BEFORE ISSUING A FINAL NEGATIVE REPORT Performed at [facility]    Report Status PENDING  Incomplete  Culture, blood (routine x 2)     Status: None (Preliminary result)   Collection Time: 02/18/15 12:10 PM  Result Value Ref Range Status   Specimen Description BLOOD RIGHT HAND  Final   Special Requests BOTTLES DRAWN AEROBIC ONLY 1CC  Final   Culture   Final           BLOOD CULTURE RECEIVED NO GROWTH TO DATE CULTURE WILL BE HELD FOR 5 DAYS BEFORE ISSUING A FINAL NEGATIVE REPORT Performed at [facility]    Report Status PENDING  Incomplete    Medical History: Past Medical History  Diagnosis Date  . Hypertension   . Seizures     last episode 03/2013  . Asthma   . Brain bleed       Assessment: 44 YOM with CoNS bacteremia to transition of vancomycin to cefazolin.  Patient's renal function has been stable and his UOP is good.  Vanc 3/5 >> 3/10 Ancef 3/10 >>  3/8 VT = 14.1 mcg/mL on 1g q8 >> 1250mg  q8h  3/7 BCx x2 - NGTD 3/3 BCx x2 - staphylococcus (sensitivities to Levaquin, Clinda, Vanc) 3/1 UCx - CoNS (sensitive to [place], Levaquin, Nitro, TCN, Bactrim, Vanc)   Goal of Therapy:  Resolution of infection   Plan:  - Ancef 2gm IV Q8H -  Monitor renal fxn, clinical progress - Continue Lovenox 100mg  SQ Q12H - CBC Q72H while on Lovenox    Preston Weill D. Mina Marble, PharmD, BCPS Pager:  [phone] 02/21/2015, 11:30 AM

## 2015-02-21 NOTE — Progress Notes (Signed)
PULMONARY / CRITICAL CARE MEDICINE   Name: Richard [name] MRN: [id number] DOB: 1971/06/06    ADMISSION DATE:  02/11/2015 CONSULTATION DATE:  02/13/2015  REFERRING MD :  [facility]  CHIEF COMPLAINT: Change in mental status  INITIAL PRESENTATION:  44 yo male presented with altered mental status and alcohol intoxication.  He developed DT's.  He has hx of TBI, seizures, ETOH, cocaine.  STUDIES:  2/29 CT head >> Lt frontal encephalomalacia 3/01 EEG >> normal sleep EEG 3/02 Echo >> EF 60 to 46%, grade 1 diastolic dysfx 2/86 EEG >> generalized slowing 3/04 EEG >> generalized slowing 3/04 MRI brain >> Lt frontal encephalomalacia 3/08 Doppler legs >> DVT Rt posterior tibial vein  SIGNIFICANT EVENTS: 2/29 Admit, neuro consulted 3/02 Start precedex 3/08 Start lovenox for DVT Rt leg  SUBJECTIVE:  Remains on precedex and in restraints.  VITAL SIGNS: Temp:  [97.2 F (36.2 C)-98.5 F (36.9 C)] 97.2 F (36.2 C) (03/10 0825) Pulse Rate:  [59-101] 76 (03/10 0825) Resp:  [10-23] 14 (03/10 0825) BP: (107-174)/(68-129) 138/92 mmHg (03/10 0825) SpO2:  [86 %-98 %] 92 % (03/10 0825) FiO2 (%):  [55 %-100 %] 55 % (03/09 2100) Weight:  [209 lb 7 oz (95 kg)] 209 lb 7 oz (95 kg) (03/10 0500) INTAKE / OUTPUT:  Intake/Output Summary (Last 24 hours) at 02/21/15 1110 Last data filed at 02/21/15 1000  Gross per 24 hour  Intake 1328.07 ml  Output   4190 ml  Net -2861.93 ml    PHYSICAL EXAMINATION: General: moans with stimulation Neuro: RASS -1 HEENT: sclera injected Cardiovascular: regualr Lungs: basilar crackles Abdomen: soft, non tender Musculoskeletal: 1+ edema Skin: no rashes  LABS:  CBC  Recent Labs Lab 02/16/15 0310 02/18/15 0333 02/21/15 0657  WBC 10.7* 7.1 9.3  HGB 17.1* 15.3 16.3  HCT 51.0 46.4 47.8  PLT 219 250 291   BMET  Recent Labs Lab 02/17/15 0235 02/18/15 0333 02/21/15 0657  NA 138 138 139  K 4.2 3.6 3.5  CL 108 107 107  CO2 26 27 28   BUN 6 5* 15   CREATININE 0.97 1.01 0.95  GLUCOSE 121* 120* 123*   Electrolytes  Recent Labs Lab 02/17/15 0235 02/18/15 0333 02/21/15 0657  CALCIUM 8.2* 8.4 8.6  MG  --   --  1.8   Sepsis Markers  Recent Labs Lab 02/15/15 0248  PROCALCITON <0.10   Liver Enzymes  Recent Labs Lab 02/15/15 0248 02/16/15 0310 02/21/15 0657  AST 23 22 20   ALT 28 30 21   ALKPHOS 71 66 65  BILITOT 1.1 0.9 0.6  ALBUMIN 2.8* 2.8* 2.6*   Glucose  Recent Labs Lab 02/20/15 0749 02/20/15 0923 02/20/15 1531 02/20/15 1915 02/20/15 2349 02/21/15 0421  GLUCAP 135* 113* 109* 112* 93 122*    Imaging Dg Chest Port 1 View  02/20/2015   CLINICAL DATA:  Hypoxia.  EXAM: PORTABLE CHEST - 1 VIEW  COMPARISON:  02/18/2015  FINDINGS: NG tube enters the stomach. Heart is borderline in size. Mild vascular congestion and diffuse interstitial prominence which could reflect early interstitial edema. Bibasilar opacities likely reflect atelectasis. No significant effusions. No acute bony abnormality.  IMPRESSION: Suspect early interstitial edema/ CHF.  Bibasilar atelectasis.   Electronically Signed   By: Rolm Baptise M.D.   On: 02/20/2015 08:20     ASSESSMENT / PLAN:  PULMONARY A: Acute hypoxic respiratory failure >> likely from pulmonary edema. P:   Continue lasix Oxygen to keep SpO2 > 92% F/u CXR  CARDIOVASCULAR A:  HTN. DVT Rt leg noted 3//08. P:  Continue catapres, lasix Lovenox per pharmacy  RENAL A:   Hypokalemia. P:   F/u and replace electrolytes as needed  GASTROINTESTINAL A:   Nutrition. P:   Tube feeds Protonix  HEMATOLOGIC A:   No acute issues. P:  F/u CBC  INFECTIOUS A:   Staph bacteremia. P:   Day 7/14 of Abx, change to ancef 3/10  Urine 3/01 >> Coag neg staph Blood 3/03 >> Coag neg staph Blood 3/07 >>  ENDOCRINE A:   No acute issues.   P:   Monitor blood sugars  NEUROLOGIC A:   Acute encephalopathy 2nd to ETOH withdrawal with DT's. Hx of TBI, seizure  disorder. P:   RASS goal 0 Wean off precedex as tolerated Change keppra to per tube 8/01 Thiamine, folic acid Continue risperdal Scheduled klonopin per tube 3/10 Prn Ativan IV for CIWA > 8  CC time 40 minutes.  Chesley Mires, MD [facility] Pulmonary/Critical Care 02/21/2015, 11:34 AM Pager:  [phone] After 3pm call: [phone]

## 2015-02-22 ENCOUNTER — Inpatient Hospital Stay (HOSPITAL_COMMUNITY): Payer: Medicare Other

## 2015-02-22 LAB — COMPREHENSIVE METABOLIC PANEL
ALBUMIN: 2.9 g/dL — AB (ref 3.5–5.2)
ALT: 21 U/L (ref 0–53)
AST: 20 U/L (ref 0–37)
Alkaline Phosphatase: 73 U/L (ref 39–117)
Anion gap: 6 (ref 5–15)
BILIRUBIN TOTAL: 0.5 mg/dL (ref 0.3–1.2)
BUN: 11 mg/dL (ref 6–23)
CO2: 32 mmol/L (ref 19–32)
Calcium: 8.8 mg/dL (ref 8.4–10.5)
Chloride: 100 mmol/L (ref 96–112)
Creatinine, Ser: 0.96 mg/dL (ref 0.50–1.35)
GFR calc non Af Amer: >90 mL/min (ref >90)
GLUCOSE: 120 mg/dL — AB (ref 70–99)
POTASSIUM: 3.8 mmol/L (ref 3.5–5.1)
SODIUM: 138 mmol/L (ref 135–145)
TOTAL PROTEIN: 6.8 g/dL (ref 6.0–8.3)

## 2015-02-22 LAB — CBC
HEMATOCRIT: 49.1 % (ref 39.0–52.0)
Hemoglobin: 16.7 g/dL (ref 13.0–17.0)
MCH: 31.6 pg (ref 26.0–34.0)
MCHC: 34 g/dL (ref 30.0–36.0)
MCV: 93 fL (ref 78.0–100.0)
Platelets: 294 10*3/uL (ref 150–400)
RBC: 5.28 MIL/uL (ref 4.22–5.81)
RDW: 13.7 % (ref 11.5–15.5)
WBC: 8.7 10*3/uL (ref 4.0–10.5)

## 2015-02-22 LAB — GLUCOSE, CAPILLARY
GLUCOSE-CAPILLARY: 134 mg/dL — AB (ref 70–99)
GLUCOSE-CAPILLARY: 137 mg/dL — AB (ref 70–99)
Glucose-Capillary: 106 mg/dL — ABNORMAL HIGH (ref 70–99)
Glucose-Capillary: 112 mg/dL — ABNORMAL HIGH (ref 70–99)
Glucose-Capillary: 124 mg/dL — ABNORMAL HIGH (ref 70–99)
Glucose-Capillary: 145 mg/dL — ABNORMAL HIGH (ref 70–99)

## 2015-02-22 LAB — MAGNESIUM: Magnesium: 1.9 mg/dL (ref 1.5–2.5)

## 2015-02-22 LAB — PHOSPHORUS: PHOSPHORUS: 3.4 mg/dL (ref 2.3–4.6)

## 2015-02-22 MED ORDER — ENOXAPARIN SODIUM 100 MG/ML ~~LOC~~ SOLN
95.0000 mg | Freq: Two times a day (BID) | SUBCUTANEOUS | Status: DC
  Administered 2015-02-22 – 2015-02-24 (×5): 95 mg via SUBCUTANEOUS
  Administered 2015-02-24: 100 mg via SUBCUTANEOUS
  Administered 2015-02-25 – 2015-02-28 (×7): 95 mg via SUBCUTANEOUS
  Filled 2015-02-22 (×18): qty 1

## 2015-02-22 MED ORDER — CLONIDINE ORAL SUSPENSION 10 MCG/ML
0.2000 mg | Freq: Three times a day (TID) | ORAL | Status: DC
  Administered 2015-02-22 – 2015-02-25 (×9): 0.2 mg
  Filled 2015-02-22 (×15): qty 20

## 2015-02-22 NOTE — Progress Notes (Signed)
PULMONARY / CRITICAL CARE MEDICINE   Name: Richard [name] MRN: [id number] DOB: Aug 11, 1971    ADMISSION DATE:  02/11/2015 CONSULTATION DATE:  02/13/2015  REFERRING MD :  [facility]ad  CHIEF COMPLAINT: Change in mental status  INITIAL PRESENTATION:  44 yo male presented with altered mental status and alcohol intoxication.  He developed DT's.  He has hx of TBI, seizures, ETOH, cocaine.  STUDIES:  2/29 CT head >> Lt frontal encephalomalacia 3/01 EEG >> normal sleep EEG 3/02 Echo >> EF 60 to 87%, grade 1 diastolic dysfx 5/64 EEG >> generalized slowing 3/04 EEG >> generalized slowing 3/04 MRI brain >> Lt frontal encephalomalacia 3/08 Doppler legs >> DVT Rt posterior tibial vein  SIGNIFICANT EVENTS: 2/29 Admit, neuro consulted 3/02 Start precedex 3/08 Start lovenox for DVT Rt leg  SUBJECTIVE:  Remains on precedex and in restraints.  VITAL SIGNS: Temp:  [97.3 F (36.3 C)-98.5 F (36.9 C)] 97.7 F (36.5 C) (03/11 0825) Pulse Rate:  [66-78] 73 (03/11 0600) Resp:  [17-26] 24 (03/11 0600) BP: (117-153)/(72-111) 126/91 mmHg (03/11 0600) SpO2:  [90 %-98 %] 92 % (03/11 0600) INTAKE / OUTPUT:  Intake/Output Summary (Last 24 hours) at 02/22/15 0909 Last data filed at 02/22/15 0800  Gross per 24 hour  Intake 2874.65 ml  Output   3410 ml  Net -535.35 ml    PHYSICAL EXAMINATION: General: moans with stimulation Neuro: RASS -2 HEENT: sclera injected Cardiovascular: regualr Lungs: basilar crackles Abdomen: soft, non tender Musculoskeletal: 1+ edema Skin: no rashes  LABS:  CBC  Recent Labs Lab 02/18/15 0333 02/21/15 0657 02/22/15 0204  WBC 7.1 9.3 8.7  HGB 15.3 16.3 16.7  HCT 46.4 47.8 49.1  PLT 250 291 294   BMET  Recent Labs Lab 02/18/15 0333 02/21/15 0657 02/22/15 0204  NA 138 139 138  K 3.6 3.5 3.8  CL 107 107 100  CO2 27 28 32  BUN 5* 15 11  CREATININE 1.01 0.95 0.96  GLUCOSE 120* 123* 120*   Electrolytes  Recent Labs Lab 02/18/15 0333 02/21/15 0657  02/22/15 0204  CALCIUM 8.4 8.6 8.8  MG  --  1.8 1.9  PHOS  --   --  3.4   Liver Enzymes  Recent Labs Lab 02/16/15 0310 02/21/15 0657 02/22/15 0204  AST 22 20 20   ALT 30 21 21   ALKPHOS 66 65 73  BILITOT 0.9 0.6 0.5  ALBUMIN 2.8* 2.6* 2.9*   Glucose  Recent Labs Lab 02/21/15 1218 02/21/15 1620 02/21/15 1933 02/21/15 2353 02/22/15 0443 02/22/15 0824  GLUCAP 109* 110* 124* 145* 134* 112*    Imaging No results found.   ASSESSMENT / PLAN:  PULMONARY A: Acute hypoxic respiratory failure >> likely from pulmonary edema. P:   Continue lasix Oxygen to keep SpO2 > 92% F/u CXR intermittently  CARDIOVASCULAR A:  HTN. DVT Rt leg noted 3/08. P:  Continue catapres, lasix Lovenox per pharmacy  RENAL A:   Hypokalemia. P:   F/u and replace electrolytes as needed  GASTROINTESTINAL A:   Nutrition. P:   Tube feeds Protonix  HEMATOLOGIC A:   No acute issues. P:  F/u CBC  INFECTIOUS A:   Staph bacteremia. P:   Day 8/14 of Abx, change to ancef 3/10  Urine 3/01 >> Coag neg staph Blood 3/03 >> Coag neg staph Blood 3/07 >>  ENDOCRINE A:   No acute issues.   P:   Monitor blood sugars  NEUROLOGIC A:   Acute encephalopathy 2nd to ETOH withdrawal with DT's. Hx  of TBI, seizure disorder. P:   RASS goal 0 Wean off precedex as tolerated Changed keppra to per tube 5/44 Thiamine, folic acid Continue risperdal Scheduled klonopin per tube 3/10 Prn Ativan IV for CIWA > 8  CC time 35 minutes.  Chesley Mires, MD [facility] Pulmonary/Critical Care 02/22/2015, 9:09 AM Pager:  [phone] After 3pm call: [phone]

## 2015-02-22 NOTE — Progress Notes (Signed)
ANTICOAGULATION CONSULT NOTE - Follow Up Consult  Pharmacy Consult:  Lovenox Indication:  New right DVT  Allergies  Allergen Reactions  . Dilaudid [Hydromorphone Hcl] Other (See Comments)    Bradycardia and Nausea    Patient Measurements: Height: 6' (182.9 cm) Weight: 209 lb 7 oz (95 kg) IBW/kg (Calculated) : 77.6  Vital Signs: Temp: 97.7 F (36.5 C) (03/11 0825) Temp Source: Oral (03/11 0825) BP: 126/91 mmHg (03/11 0600) Pulse Rate: 73 (03/11 0600)  Labs:  Recent Labs  02/21/15 0657 02/22/15 0204  HGB 16.3 16.7  HCT 47.8 49.1  PLT 291 294  CREATININE 0.95 0.96    Estimated Creatinine Clearance: 117.5 mL/min (by C-G formula based on Cr of 0.96).      Assessment: Richard [name] with new DVT to continue on Lovenox.  Patient's renal function has been stable and no bleeding reported.  CBC WNL and stable.  His weight has trended down.   Goal of Therapy:  Anti-Xa level 0.6-1 units/ml 4hrs after LMWH dose given Monitor platelets by anticoagulation protocol: Yes    Plan:  - Change Lovenox to 95mg  SQ Q12H - CBC Q72H while on Lovenox - F/U oral anticoagulation when possible - Watch weight    Kareema Keitt D. Mina Marble, PharmD, BCPS Pager:  [phone] 02/22/2015, 9:12 AM

## 2015-02-22 NOTE — Progress Notes (Signed)
eLink Physician-Brief Progress Note Patient Name: Richard [name] DOB: [birth year] MRN: [id number]   Date of Service  02/22/2015  HPI/Events of Note  Nurse calling with 2 patient related issues. 1. Restraints to be renewed for patient safety 2. Left upper extremity enlarged compared to right upper extremity.  Patient is already on anticoagulation for a DVT in lower extremity. Camera check of upper extremities shows the left upper extremity to be obviously enlarged compared to right upper extremity.  No change in color or increased warmth.  eICU Interventions  Plan: Order for restraints renewed Order for Upper extremity venous doppler to r/o DVT     Intervention Category Minor Interventions: Clinical assessment - ordering diagnostic tests  DETERDING,ELIZABETH 02/22/2015, 9:25 PM

## 2015-02-22 NOTE — Progress Notes (Signed)
NUTRITION FOLLOW UP  DOCUMENTATION CODES Per approved criteria  -Severe malnutrition in the context of acute illness or injury        Intervention:   -Increase Jevity 1.2 to goal rate of 70 ml/hr via NG tube with 30 ml Pro-stat BID.    Tube feeding regimen provides 2316 kcal, 138 grams of protein, and 1377 ml of H2O.   Nutrition Dx:   Malnutrition related to acute illness as evidenced by 7% weight loss x 8 days and intake of </= 50% of their needs for >/= 5 days. (NPO x 8 days); ongoing  Goal:   Pt to meet >/= 90% of their estimated nutrition needs; not met.  Monitor:   TF tolerance/adequacy, labs  Assessment:   Pt with hx of ETOH and polysubstance abuse admitted with encephalopathy and alcohol intoxication. Suspect poor diet PTA.  Pt transferred to ICU 3/2.   NG placed to start TF 3/8.  Pt currently on Jevity. 1.2 @ 40 ml/hr providing 1152 kcal (50% of estimate needs), 53g protien and 775 ml free water  Labs: Mag and Phos: WDL  Height: Ht Readings from Last 1 Encounters:  02/11/15 6' (1.829 m)    Weight Status:   Wt Readings from Last 1 Encounters:  02/21/15 209 lb 7 oz (95 kg)    Re-estimated needs:  Kcal: 2300-2500 Protein: 115-130 grams Fluid: > 2.3 L/day  Skin: Stage II on top of ear  Diet Order: Diet NPO time specified   Intake/Output Summary (Last 24 hours) at 02/22/15 0852 Last data filed at 02/22/15 0800  Gross per 24 hour  Intake 2874.65 ml  Output   3410 ml  Net -535.35 ml    Last BM: none charted   Labs:   Recent Labs Lab 02/18/15 0333 02/21/15 0657 02/22/15 0204  NA 138 139 138  K 3.6 3.5 3.8  CL 107 107 100  CO2 27 28 32  BUN 5* 15 11  CREATININE 1.01 0.95 0.96  CALCIUM 8.4 8.6 8.8  MG  --  1.8 1.9  PHOS  --   --  3.4  GLUCOSE 120* 123* 120*    CBG (last 3)   Recent Labs  02/21/15 2353 02/22/15 0443 02/22/15 0824  GLUCAP 145* 134* 112*    Scheduled Meds: . antiseptic oral rinse  7 mL Mouth Rinse q12n4p  .   ceFAZolin (ANCEF) IV  2 g Intravenous 3 times per day  . chlorhexidine  15 mL Mouth Rinse BID  . clonazePAM  1 mg Per Tube TID  . cloNIDine  0.2 mg Per Tube 3 times per day  . enoxaparin (LOVENOX) injection  100 mg Subcutaneous BID  . feeding supplement (PRO-STAT SUGAR FREE 64)  30 mL Per Tube BID  . folic acid  1 mg Per Tube Daily  . furosemide  20 mg Intravenous BID  . Influenza vac split quadrivalent PF  0.5 mL Intramuscular Tomorrow-1000  . levETIRAcetam  500 mg Per Tube Q12H  . pantoprazole sodium  40 mg Per Tube Daily  . risperiDONE  2 mg Per Tube BID  . sodium chloride  3 mL Intravenous Q12H  . thiamine  100 mg Oral Daily    Continuous Infusions: . sodium chloride 10 mL/hr at 02/22/15 0800  . dexmedetomidine 0.802 mcg/kg/hr (02/22/15 0800)  . feeding supplement (JEVITY 1.2 CAL) 1,000 mL (02/22/15 0800)    Elmer Picker MS Dietetic Intern Pager Number [phone]

## 2015-02-22 NOTE — Progress Notes (Signed)
RUE > LUE, interventions per [facility] MD note, OK to continue using LUE for IV access per MD.

## 2015-02-23 ENCOUNTER — Inpatient Hospital Stay (HOSPITAL_COMMUNITY): Payer: Medicare Other

## 2015-02-23 LAB — BASIC METABOLIC PANEL
Anion gap: 7 (ref 5–15)
BUN: 14 mg/dL (ref 6–23)
CALCIUM: 9 mg/dL (ref 8.4–10.5)
CO2: 32 mmol/L (ref 19–32)
Chloride: 101 mmol/L (ref 96–112)
Creatinine, Ser: 0.89 mg/dL (ref 0.50–1.35)
GFR calc Af Amer: >90 mL/min (ref >90)
GFR calc non Af Amer: >90 mL/min (ref >90)
Glucose, Bld: 143 mg/dL — ABNORMAL HIGH (ref 70–99)
Potassium: 3.6 mmol/L (ref 3.5–5.1)
Sodium: 140 mmol/L (ref 135–145)

## 2015-02-23 LAB — POCT I-STAT 3, ART BLOOD GAS (G3+)
Acid-Base Excess: 5 mmol/L — ABNORMAL HIGH (ref 0.0–2.0)
Bicarbonate: 31.6 mEq/L — ABNORMAL HIGH (ref 20.0–24.0)
O2 Saturation: 98 %
PO2 ART: 102 mmHg — AB (ref 80.0–100.0)
Patient temperature: 98.1
TCO2: 33 mmol/L (ref 0–100)
pCO2 arterial: 53.9 mmHg — ABNORMAL HIGH (ref 35.0–45.0)
pH, Arterial: 7.375 (ref 7.350–7.450)

## 2015-02-23 LAB — GLUCOSE, CAPILLARY
GLUCOSE-CAPILLARY: 128 mg/dL — AB (ref 70–99)
GLUCOSE-CAPILLARY: 151 mg/dL — AB (ref 70–99)
GLUCOSE-CAPILLARY: 86 mg/dL (ref 70–99)
Glucose-Capillary: 109 mg/dL — ABNORMAL HIGH (ref 70–99)
Glucose-Capillary: 119 mg/dL — ABNORMAL HIGH (ref 70–99)

## 2015-02-23 MED ORDER — LORAZEPAM 2 MG/ML IJ SOLN
1.0000 mg | INTRAMUSCULAR | Status: DC | PRN
  Administered 2015-02-24 – 2015-02-25 (×3): 1 mg via INTRAVENOUS
  Filled 2015-02-23 (×3): qty 1

## 2015-02-23 MED ORDER — NICOTINE 21 MG/24HR TD PT24
21.0000 mg | MEDICATED_PATCH | Freq: Every day | TRANSDERMAL | Status: DC
  Administered 2015-02-23 – 2015-02-28 (×6): 21 mg via TRANSDERMAL
  Filled 2015-02-23 (×7): qty 1

## 2015-02-23 MED ORDER — RISPERIDONE 1 MG/ML PO SOLN
1.0000 mg | Freq: Two times a day (BID) | ORAL | Status: DC
  Administered 2015-02-23 – 2015-02-24 (×3): 1 mg
  Filled 2015-02-23 (×7): qty 1

## 2015-02-23 MED ORDER — FUROSEMIDE 10 MG/ML IJ SOLN
40.0000 mg | Freq: Two times a day (BID) | INTRAMUSCULAR | Status: DC
  Administered 2015-02-23 – 2015-02-24 (×2): 40 mg via INTRAVENOUS
  Filled 2015-02-23 (×2): qty 4

## 2015-02-23 MED ORDER — CLONAZEPAM 0.5 MG PO TABS
0.5000 mg | ORAL_TABLET | Freq: Two times a day (BID) | ORAL | Status: DC
  Administered 2015-02-23 – 2015-02-24 (×4): 0.5 mg
  Filled 2015-02-23 (×4): qty 1

## 2015-02-23 MED ORDER — LORAZEPAM 2 MG/ML IJ SOLN: 1.0000 mg | INTRAMUSCULAR | Status: DC

## 2015-02-23 MED ORDER — LORAZEPAM 2 MG/ML IJ SOLN
INTRAMUSCULAR | Status: AC
  Filled 2015-02-23: qty 1

## 2015-02-23 MED ORDER — POTASSIUM CHLORIDE 20 MEQ/15ML (10%) PO SOLN
40.0000 meq | Freq: Every day | ORAL | Status: DC
  Administered 2015-02-23: 40 meq
  Filled 2015-02-23 (×2): qty 30

## 2015-02-23 NOTE — Progress Notes (Signed)
PULMONARY / CRITICAL CARE MEDICINE   Name: Richard [name] MRN: [id number] DOB: 03-May-1971    ADMISSION DATE:  02/11/2015 CONSULTATION DATE:  02/13/2015  REFERRING MD :  Triad  CHIEF COMPLAINT: Change in mental status  INITIAL PRESENTATION:  44 yo male presented with altered mental status and alcohol intoxication.  He developed DT's.  He has hx of TBI, seizures, ETOH, cocaine.  STUDIES:  2/29 CT head >> Lt frontal encephalomalacia 3/01 EEG >> normal sleep EEG 3/02 Echo >> EF 60 to 58%, grade 1 diastolic dysfx 8/50 EEG >> generalized slowing 3/04 EEG >> generalized slowing 3/04 MRI brain >> Lt frontal encephalomalacia 3/08 Doppler legs >> DVT Rt posterior tibial vein  SIGNIFICANT EVENTS: 2/29 Admit, neuro consulted 3/02 Start precedex 3/11 results of RLE DVT +, LMWH started.  3/12 trying to decrease sedation Awaiting [place] LUE.Marland KitchenPossilbe DVT. Requiring High FIO2    SUBJECTIVE:  Remains on precedex and in restraints. Very sedated.   VITAL SIGNS: Temp:  [97.7 F (36.5 C)-98.4 F (36.9 C)] 97.7 F (36.5 C) (03/11 2348) Pulse Rate:  [62-151] 62 (03/12 0600) Resp:  [12-26] 13 (03/12 0600) BP: (93-155)/(57-113) 113/78 mmHg (03/12 0600) SpO2:  [85 %-100 %] 100 % (03/12 0600) FiO2 (%):  [100 %] 100 % (03/12 0600) Weight:  [95.255 kg (210 lb)] 95.255 kg (210 lb) (03/12 0500) INTAKE / OUTPUT:  Intake/Output Summary (Last 24 hours) at 02/23/15 0747 Last data filed at 02/23/15 0603  Gross per 24 hour  Intake 2192.1 ml  Output   1720 ml  Net  472.1 ml    PHYSICAL EXAMINATION: General: moans with stimulation Neuro: RASS -3. Will awaken after repeated physical stimulus. Will f/c. Sp slurred and garbled. Moves all ext  HEENT: sclera injected Cardiovascular: regualr Lungs: basilar crackles, no accessory muscle use  Abdomen: soft, non tender Musculoskeletal: 1+ edema Skin: no rashes. Marked LUE swelling much greater in circumference than right   LABS:  CBC  Recent Labs Lab  02/18/15 0333 02/21/15 0657 02/22/15 0204  WBC 7.1 9.3 8.7  HGB 15.3 16.3 16.7  HCT 46.4 47.8 49.1  PLT 250 291 294   BMET  Recent Labs Lab 02/21/15 0657 02/22/15 0204 02/23/15 0343  NA 139 138 140  K 3.5 3.8 3.6  CL 107 100 101  CO2 28 32 32  BUN 15 11 14   CREATININE 0.95 0.96 0.89  GLUCOSE 123* 120* 143*   Electrolytes  Recent Labs Lab 02/21/15 0657 02/22/15 0204 02/23/15 0343  CALCIUM 8.6 8.8 9.0  MG 1.8 1.9  --   PHOS  --  3.4  --    Liver Enzymes  Recent Labs Lab 02/21/15 0657 02/22/15 0204  AST 20 20  ALT 21 21  ALKPHOS 65 73  BILITOT 0.6 0.5  ALBUMIN 2.6* 2.9*   Glucose  Recent Labs Lab 02/22/15 0443 02/22/15 0824 02/22/15 1216 02/22/15 1613 02/22/15 1959 02/22/15 2345  GLUCAP 134* 112* 106* 137* 124* 151*    Imaging Dg Chest Port 1 View  02/22/2015   CLINICAL DATA:  CHF.  EXAM: PORTABLE CHEST - 1 VIEW  COMPARISON:  02/20/2015.  FINDINGS: Mediastinum hilar structures normal. NG tube noted with tip below the left hemidiaphragm. Cardiomegaly. Interim slight clearing of bibasilar pulmonary interstitial infiltrates suggesting partial clearing of CHF. No significant pleural effusion or pneumothorax. No acute osseus abnormality.  IMPRESSION: 1. NG tube in stable position. 2. Slight clearing of congestive heart failure and interstitial edema with residual interstitial edema present.   Electronically Signed  By: Hand   On: 02/22/2015 07:35     ASSESSMENT / PLAN:  PULMONARY A: Acute hypoxic respiratory failure >> Presumed from pulmonary edema. Possible PE Comparing 3/9 to 3/11 aeration improved. If he is truly requiring 100% NRB wonder about Pulmonary emboli given his O2 requirements seem to be out of proportion w/ his CXR changes.   P:   Continue lasix Oxygen to keep SpO2 > 92% F/u CXR intermittently Repeat CXR this am Ck ABG  No real benefit in CT scan as already treating.Marland Kitchen   CARDIOVASCULAR A:  HTN. P:  Continue  catapres, lasix   RENAL A:   Intermittent Hypokalemia. P:   F/u and replace electrolytes as needed  GASTROINTESTINAL A:   Nutrition. P:   Tube feeds Protonix  HEMATOLOGIC A:   RLE DVT Possibly LUE DVT: won't change pharmacological rx but would change IV access choice   P:  F/u CBC Therapeutic LMWH Will need to decide on oral rx when encephalopathy resolves. Doubt good coumadin candidate   INFECTIOUS A:   Staph bacteremia. P:   Day 9/14 of Abx, change to ancef 3/10  Urine 3/01 >> Coag neg staph Blood 3/03 >> Coag neg staph Blood 3/07 >>  ENDOCRINE A:   No acute issues.   P:   Monitor blood sugars  NEUROLOGIC A:   Acute encephalopathy 2nd to ETOH withdrawal with DT's. Hx of TBI, seizure disorder. P:   RASS goal 0 Wean off precedex as tolerated Changed keppra to per tube 7/85 Thiamine, folic acid Continue risperdal Scheduled klonopin per tube 3/10, will decrease dosing and frequency as he is sedated    NP SUMMARY STATEMENT Very lethargic and sedated. Difficult to arouse. Think we need to decrease his benzos, and I have added hold orders. His left arm is remarkably larger than right. Raising concern for DVT. Although he is already on LMWH this would change our IV access sites... Might even need to consider Right IJ or possibly right PICC (prefer PICC). Also concerned that he is on 100% NRB. Not clear to me that there has been much attempt to wean. Will get ABG on NRB to see where we are at. Also repeat PCXR.. If CXR still about same as yesterday then we may also be dealing w/ pulmonary emboli. If so LMWH was started yesterday and hope to start weaning O2 more aggressively soon.   Erick Colace ACNP-BC [place] Pager # [phone] OR # [phone] if no answer  PCCM ATTENDING: I have reviewed pt's initial presentation, consultants notes and hospital database in detail.  The above assessment and plan was formulated under my direction.  In  summary: Major problem has been acute encephalopathy. He is markedly improved now with RASS 0, minimal confusion and no focal neurological deficits. He is in no respiratory distress on [place] O2. Hemodynamically stable, renal and electrolytes ok. Will continue to wean off sedative agents (dexmedetomidine) and DC NGT. Will begin diet with supervision and assistance   Merton Border, MD;  PCCM service; [place] [phone]

## 2015-02-24 LAB — GLUCOSE, CAPILLARY
GLUCOSE-CAPILLARY: 109 mg/dL — AB (ref 70–99)
GLUCOSE-CAPILLARY: 196 mg/dL — AB (ref 70–99)
Glucose-Capillary: 129 mg/dL — ABNORMAL HIGH (ref 70–99)
Glucose-Capillary: 139 mg/dL — ABNORMAL HIGH (ref 70–99)
Glucose-Capillary: 93 mg/dL (ref 70–99)

## 2015-02-24 LAB — CBC
HEMATOCRIT: 51 % (ref 39.0–52.0)
HEMOGLOBIN: 17.1 g/dL — AB (ref 13.0–17.0)
MCH: 32.4 pg (ref 26.0–34.0)
MCHC: 33.5 g/dL (ref 30.0–36.0)
MCV: 96.6 fL (ref 78.0–100.0)
Platelets: 336 10*3/uL (ref 150–400)
RBC: 5.28 MIL/uL (ref 4.22–5.81)
RDW: 13.9 % (ref 11.5–15.5)
WBC: 8 10*3/uL (ref 4.0–10.5)

## 2015-02-24 LAB — CULTURE, BLOOD (ROUTINE X 2)
Culture: NO GROWTH
Culture: NO GROWTH

## 2015-02-24 MED ORDER — POTASSIUM CHLORIDE CRYS ER 20 MEQ PO TBCR
40.0000 meq | EXTENDED_RELEASE_TABLET | Freq: Every day | ORAL | Status: DC
  Administered 2015-02-24 – 2015-03-01 (×5): 40 meq via ORAL
  Filled 2015-02-24 (×6): qty 2

## 2015-02-24 MED ORDER — SODIUM CHLORIDE 0.9 % IJ SOLN
10.0000 mL | Freq: Two times a day (BID) | INTRAMUSCULAR | Status: DC
  Administered 2015-02-24 – 2015-02-28 (×8): 10 mL

## 2015-02-24 MED ORDER — SODIUM CHLORIDE 0.9 % IJ SOLN
10.0000 mL | INTRAMUSCULAR | Status: DC | PRN
  Administered 2015-02-28: 10 mL
  Filled 2015-02-24: qty 40

## 2015-02-24 NOTE — Progress Notes (Signed)
Pt therapeutic on subcutaneous lovenox. Okay to get OOB to chair. Per Salvadore Dom, NP. [id number] Pt in chair, telemetry on, pt 95% on room air. No c/o pain or distress.

## 2015-02-24 NOTE — Progress Notes (Signed)
Pt transferred to 2C11. Report given to RN. Pt in no signs of distress and has no C/O pain.

## 2015-02-24 NOTE — Progress Notes (Signed)
Peripherally Inserted Central Catheter/Midline Placement  The IV Nurse has discussed with the patient and/or persons authorized to consent for the patient, the purpose of this procedure and the potential benefits and risks involved with this procedure.  The benefits include less needle sticks, lab draws from the catheter and patient may be discharged home with the catheter.  Risks include, but not limited to, infection, bleeding, blood clot (thrombus formation), and puncture of an artery; nerve damage and irregular heat beat.  Alternatives to this procedure were also discussed.  Midline explained to wife, consent given over the telephone.  PICC/Midline Placement Documentation  PICC / Midline Single Lumen 02/24/15 Midline Right Basilic 18 cm 0 cm (Active)  Indication for Insertion or Continuance of Line Limited venous access - need for IV therapy >5 days (PICC only) 02/24/2015 11:21 AM  Exposed Catheter (cm) 0 cm 02/24/2015 11:21 AM  Site Assessment Clean;Dry;Intact 02/24/2015 11:21 AM  Line Status Flushed;Saline locked;Blood return noted 02/24/2015 11:21 AM  Dressing Type Transparent 02/24/2015 11:21 AM  Dressing Status Clean;Dry;Intact;Antimicrobial disc in place 02/24/2015 11:21 AM  Line Care Connections checked and tightened 02/24/2015 11:21 AM  Line Adjustment (NICU/IV Team Only) No 02/24/2015 11:21 AM  Dressing Intervention New dressing 02/24/2015 11:21 AM  Dressing Change Due 03/03/15 02/24/2015 11:21 AM       Rolena Infante 02/24/2015, 11:22 AM

## 2015-02-24 NOTE — Progress Notes (Signed)
eLink Physician-Brief Progress Note Patient Name: Richard [name] DOB: [birth year] MRN: [id number]   Date of Service  02/24/2015  HPI/Events of Note  Patient safety  eICU Interventions  Bilateral soft wrist restraints renewed for patient safety.  Patient remains on precedex gtt for ongoing agitation        DETERDING,ELIZABETH 02/24/2015, 12:04 AM

## 2015-02-24 NOTE — Progress Notes (Addendum)
PULMONARY / CRITICAL CARE MEDICINE   Name: Richard [name] MRN: [id number] DOB: Jun 29, 1971    ADMISSION DATE:  02/11/2015 CONSULTATION DATE:  02/13/2015  REFERRING MD :  Triad  CHIEF COMPLAINT: Change in mental status  INITIAL PRESENTATION:  44 yo male presented with altered mental status and alcohol intoxication.  He developed DT's.  He has hx of TBI, seizures, ETOH, cocaine.  STUDIES:  2/29 CT head >> Lt frontal encephalomalacia 3/01 EEG >> normal sleep EEG 3/02 Echo >> EF 60 to 08%, grade 1 diastolic dysfx 6/57 EEG >> generalized slowing 3/04 EEG >> generalized slowing 3/04 MRI brain >> Lt frontal encephalomalacia 3/08 Doppler legs >> DVT Rt posterior tibial vein  SIGNIFICANT EVENTS: 2/29 Admit, neuro consulted 3/02 Start precedex 3/11 results of RLE DVT +, LMWH started.  3/12 trying to decrease sedation Awaiting [place] LUE.Marland KitchenPossilbe DVT. Requiring High FIO2  3/13: now on [place], tolerated dose reduction of Risperdal and clonopin. Eating regular diet. PT/OT ordered.     SUBJECTIVE:  Much more awake   VITAL SIGNS: Temp:  [98 F (36.7 C)-98.5 F (36.9 C)] 98.3 F (36.8 C) (03/13 0738) Pulse Rate:  [64-81] 71 (03/13 0725) Resp:  [12-21] 15 (03/13 0725) BP: (112-137)/(79-98) 115/82 mmHg (03/13 0700) SpO2:  [88 %-100 %] 92 % (03/13 0725) FiO2 (%):  [100 %] 100 % (03/12 1100) Weight:  [97.523 kg (215 lb)] 97.523 kg (215 lb) (03/13 0500) INTAKE / OUTPUT:  Intake/Output Summary (Last 24 hours) at 02/24/15 0830 Last data filed at 02/24/15 0700  Gross per 24 hour  Intake 2006.37 ml  Output   1975 ml  Net  31.37 ml    PHYSICAL EXAMINATION: General: awake, thankful to staff. Cooperative  Neuro: RASS 0. Sp more clear, moves all ext. Oriented x2.  HEENT: sclera injected Cardiovascular: regualr Lungs: basilar crackles, no accessory muscle use  Abdomen: soft, non tender Musculoskeletal: 1+ edema Skin: no rashes. Marked LUE swelling much greater in circumference than right    LABS:  CBC  Recent Labs Lab 02/18/15 0333 02/21/15 0657 02/22/15 0204  WBC 7.1 9.3 8.7  HGB 15.3 16.3 16.7  HCT 46.4 47.8 49.1  PLT 250 291 294   BMET  Recent Labs Lab 02/21/15 0657 02/22/15 0204 02/23/15 0343  NA 139 138 140  K 3.5 3.8 3.6  CL 107 100 101  CO2 28 32 32  BUN 15 11 14   CREATININE 0.95 0.96 0.89  GLUCOSE 123* 120* 143*   Electrolytes  Recent Labs Lab 02/21/15 0657 02/22/15 0204 02/23/15 0343  CALCIUM 8.6 8.8 9.0  MG 1.8 1.9  --   PHOS  --  3.4  --    Liver Enzymes  Recent Labs Lab 02/21/15 0657 02/22/15 0204  AST 20 20  ALT 21 21  ALKPHOS 65 73  BILITOT 0.6 0.5  ALBUMIN 2.6* 2.9*   Glucose  Recent Labs Lab 02/23/15 1129 02/23/15 1448 02/23/15 2009 02/24/15 0026 02/24/15 0318 02/24/15 0702  GLUCAP 86 109* 119* 196* 129* 139*    Imaging Dg Chest Port 1 View  02/23/2015   CLINICAL DATA:  44 year old male with a history of acute respiratory failure  EXAM: PORTABLE CHEST - 1 VIEW  COMPARISON:  02/22/2015, 02/20/2015, 02/18/2015  FINDINGS: Cardiomediastinal silhouette within normal limits in size and contour, unchanged from prior. Accentuation of the left heart border likely due to leftward rotation of the patient.  Ill-defined opacities in the right hilar region. Retrocardiac region not well evaluated. Mild interlobular septal thickening.  No  pneumothorax.  Low lung volumes.  Enteric tube projects over the mediastinum, terminating out of the field of view.  IMPRESSION: Evidence of resolving edema and no evidence of lobar pneumonia. Small basilar pleural effusions not excluded.  Unchanged enteric tube.  Signed,  Dulcy Fanny. Earleen Newport, DO  [facility]  [facility]   Electronically Signed   By: Corrie Mckusick D.O.   On: 02/23/2015 09:29     ASSESSMENT / PLAN:  PULMONARY A: Acute hypoxic respiratory failure >> Presumed from pulmonary edema. Possible PE CXR and O2 requirements improved  P:    Continue lasix Oxygen to keep SpO2 > 92% F/u CXR intermittently  CARDIOVASCULAR A:  HTN. P:  Continue catapres, lasix   RENAL A:   Intermittent Hypokalemia. P:   F/u and replace electrolytes as needed  GASTROINTESTINAL A:   Nutrition. Diarrhea-->better off tubefeeds.  P:   Regular diet D/c PPI   HEMATOLOGIC A:   RLE DVT Possibly LUE DVT: won't change pharmacological rx but would change IV access choice   P:  F/u CBC Therapeutic LMWH Place TEDs hose.  Will need to decide on oral rx when encephalopathy resolves. Doubt good coumadin candidate   INFECTIOUS A:   Staph bacteremia. P:   Day 10/14 of Abx, change to ancef 3/10  Urine 3/01 >> Coag neg staph Blood 3/03 >> 2/2 Coag neg staph Blood 3/07 >> NEG  ENDOCRINE A:   No acute issues.   P:   Monitor blood sugars  NEUROLOGIC A:   Acute encephalopathy 2nd to ETOH withdrawal with DT's. Hx of TBI, seizure disorder. He looks better as of 3/12 P:   RASS goal 0 Cont scheduled risperidone, and clonazepam (dose reduced on 3/12)-->if tolerates off precedex would reduce further 3/14 Wean off precedex as tolerated Cont Keppra, Thiamine, folic acid    NP SUMMARY STATEMENT He looks much better. Have reduced sedating meds significantly. Suspect we will have him off precedex today. His left arm is remarkably larger than right. Raising concern for DVT. Although he is already on LMWH this would change our IV access sites. Will ask IV to place midline on right arm. He's ready to start PT. Need to mobilize him.  Erick Colace ACNP-BC [place] Pager # [phone] OR # [phone] if no answer   PCCM ATTENDING: I have reviewed pt's initial presentation, consultants notes and hospital database in detail.  The above assessment and plan was formulated under my direction.  In summary: Continued improvement. DC dexmedetomidine. Transfer to SDU. TRH to assume care as of AM 3/14 and PCCM off  Merton Border, MD;  PCCM service; [place] [phone]

## 2015-02-24 NOTE — Progress Notes (Signed)
ANTIBIOTIC CONSULT NOTE - INITIAL  Pharmacy Consult:  Ancef/lovenox Indication:  CoNS bacteremia/DVT  Allergies  Allergen Reactions  . Dilaudid [Hydromorphone Hcl] Other (See Comments)    Bradycardia and Nausea    Patient Measurements: Height: 6' (182.9 cm) Weight: 215 lb (97.523 kg) IBW/kg (Calculated) : 77.6  Vital Signs: Temp: 98.3 F (36.8 C) (03/13 0738) Temp Source: Oral (03/13 0800) BP: 134/92 mmHg (03/13 0800) Pulse Rate: 67 (03/13 0800) Intake/Output from previous day: 03/12 0701 - 03/13 0700 In: 2131.1 [P.O.:825; I.V.:746.1; NG/GT:410; IV Piggyback:150] Out: 2025 [Urine:2025] Intake/Output from this shift: Total I/O In: 19.8 [I.V.:19.8] Out: 600 [Urine:600]  Labs:  Recent Labs  02/22/15 0204 02/23/15 0343  WBC 8.7  --   HGB 16.7  --   PLT 294  --   CREATININE 0.96 0.89   Estimated Creatinine Clearance: 128.2 mL/min (by C-G formula based on Cr of 0.89). No results for input(s): VANCOTROUGH, VANCOPEAK, VANCORANDOM, GENTTROUGH, GENTPEAK, GENTRANDOM, TOBRATROUGH, TOBRAPEAK, TOBRARND, AMIKACINPEAK, AMIKACINTROU, AMIKACIN in the last 72 hours.   Microbiology: Recent Results (from the past 720 hour(s))  MRSA PCR Screening     Status: None   Collection Time: 02/12/15  1:25 AM  Result Value Ref Range Status   MRSA by PCR NEGATIVE NEGATIVE Final    Comment:        The GeneXpert MRSA Assay (FDA approved for NASAL specimens only), is one component of a comprehensive MRSA colonization surveillance program. It is not intended to diagnose MRSA infection nor to guide or monitor treatment for MRSA infections.   Urine culture     Status: None   Collection Time: 02/12/15  6:16 AM  Result Value Ref Range Status   Specimen Description URINE, RANDOM  Final   Special Requests NONE  Final   Colony Count   Final    75,000 COLONIES/ML Performed at [facility]    Culture   Final    STAPHYLOCOCCUS SPECIES (COAGULASE NEGATIVE) Note: RIFAMPIN AND  GENTAMICIN SHOULD NOT BE USED AS SINGLE DRUGS FOR TREATMENT OF STAPH INFECTIONS. Performed at [facility]    Report Status 02/15/2015 FINAL  Final   Organism ID, Bacteria STAPHYLOCOCCUS SPECIES (COAGULASE NEGATIVE)  Final      Susceptibility   Staphylococcus species (coagulase negative) - MIC*    GENTAMICIN <=0.5 SENSITIVE Sensitive     LEVOFLOXACIN <=0.12 SENSITIVE Sensitive     NITROFURANTOIN <=16 SENSITIVE Sensitive     OXACILLIN >=4 RESISTANT Resistant     PENICILLIN RESISTANT      RIFAMPIN <=0.5 SENSITIVE Sensitive     TRIMETH/SULFA <=10 SENSITIVE Sensitive     VANCOMYCIN 1 SENSITIVE Sensitive     TETRACYCLINE <=1 SENSITIVE Sensitive     * STAPHYLOCOCCUS SPECIES (COAGULASE NEGATIVE)  Culture, blood (routine x 2)     Status: None   Collection Time: 02/14/15 10:13 PM  Result Value Ref Range Status   Specimen Description BLOOD LEFT ARM  Final   Special Requests BOTTLES DRAWN AEROBIC ONLY 2CC  Final   Culture   Final    STAPHYLOCOCCUS SPECIES (COAGULASE NEGATIVE) Note: RIFAMPIN AND GENTAMICIN SHOULD NOT BE USED AS SINGLE DRUGS FOR TREATMENT OF STAPH INFECTIONS. This organism DOES NOT demonstrate inducible Clindamycin resistance in vitro. Note: Gram Stain Report Called to,Read Back By and Verified With: [facility] STOWE 02/16/15 AT 0240 RIDK Performed at [facility]    Report Status 02/18/2015 FINAL  Final   Organism ID, Bacteria STAPHYLOCOCCUS SPECIES (COAGULASE NEGATIVE)  Final  Susceptibility   Staphylococcus species (coagulase negative) - MIC*    CLINDAMYCIN <=0.25 SENSITIVE Sensitive     ERYTHROMYCIN >=8 RESISTANT Resistant     GENTAMICIN <=0.5 SENSITIVE Sensitive     LEVOFLOXACIN <=0.12 SENSITIVE Sensitive     OXACILLIN <=0.25 SENSITIVE Sensitive     PENICILLIN <=0.03 SENSITIVE Sensitive     RIFAMPIN <=0.5 SENSITIVE Sensitive     TRIMETH/SULFA <=10 SENSITIVE Sensitive     VANCOMYCIN 1 SENSITIVE Sensitive     TETRACYCLINE <=1 SENSITIVE Sensitive     *  STAPHYLOCOCCUS SPECIES (COAGULASE NEGATIVE)  Culture, blood (routine x 2)     Status: None   Collection Time: 02/14/15 10:24 PM  Result Value Ref Range Status   Specimen Description BLOOD LEFT HAND  Final   Special Requests BOTTLES DRAWN AEROBIC ONLY 2CC  Final   Culture   Final    STAPHYLOCOCCUS SPECIES (COAGULASE NEGATIVE) Note: SUSCEPTIBILITIES PERFORMED ON PREVIOUS CULTURE WITHIN THE LAST 5 DAYS. Note: Gram Stain Report Called to,Read Back By and Verified With: [facility] STOWE 02/16/15 AT 0445 RIDK Performed at [facility]    Report Status 02/18/2015 FINAL  Final  Culture, blood (routine x 2)     Status: None   Collection Time: 02/18/15 12:05 PM  Result Value Ref Range Status   Specimen Description BLOOD RIGHT HAND FINGER  Final   Special Requests BOTTLES DRAWN AEROBIC ONLY 1CC  Final   Culture   Final    NO GROWTH 5 DAYS Performed at [facility]    Report Status 02/24/2015 FINAL  Final  Culture, blood (routine x 2)     Status: None   Collection Time: 02/18/15 12:10 PM  Result Value Ref Range Status   Specimen Description BLOOD RIGHT HAND  Final   Special Requests BOTTLES DRAWN AEROBIC ONLY [place]  Final   Culture   Final    NO GROWTH 5 DAYS Performed at [facility]    Report Status 02/24/2015 FINAL  Final    Medical History: Past Medical History  Diagnosis Date  . Hypertension   . Seizures     last episode 03/2013  . Asthma   . Brain bleed       Assessment: 51 YOM with CoNS bacteremia to transition of vancomycin to cefazolin.  Patient's renal function has been stable. CBC stable. Plan to complete 14 days of abx.   Vanc 3/5 >> 3/10 Ancef 3/10 >>  3/7 BCx x2 - NGTD 3/3 BCx x2 - staphylococcus (sensitivities to Levaquin, [place], Vanc) 3/1 UCx - CoNS (sensitive to [place], Levaquin, Nitro, TCN, Bactrim, Vanc)   Goal of Therapy:  Resolution of infection Anti-Xa = 0.6-1  Plan:   - Ancef 2gm IV Q8H - Continue Lovenox 95mg  SQ Q12H - CBC  Q72H while on Lovenox  Onnie Boer, PharmD Pager: [phone] 02/24/2015 9:33 AM

## 2015-02-25 ENCOUNTER — Inpatient Hospital Stay (HOSPITAL_COMMUNITY): Payer: Medicare Other

## 2015-02-25 DIAGNOSIS — I824Z9 Acute embolism and thrombosis of unspecified deep veins of unspecified distal lower extremity: Secondary | ICD-10-CM | POA: Insufficient documentation

## 2015-02-25 DIAGNOSIS — I2699 Other pulmonary embolism without acute cor pulmonale: Secondary | ICD-10-CM

## 2015-02-25 DIAGNOSIS — R569 Unspecified convulsions: Secondary | ICD-10-CM

## 2015-02-25 DIAGNOSIS — J810 Acute pulmonary edema: Secondary | ICD-10-CM

## 2015-02-25 DIAGNOSIS — I824Z1 Acute embolism and thrombosis of unspecified deep veins of right distal lower extremity: Secondary | ICD-10-CM

## 2015-02-25 LAB — BASIC METABOLIC PANEL
Anion gap: 9 (ref 5–15)
BUN: 15 mg/dL (ref 6–23)
CO2: 31 mmol/L (ref 19–32)
Calcium: 9 mg/dL (ref 8.4–10.5)
Chloride: 99 mmol/L (ref 96–112)
Creatinine, Ser: 1.11 mg/dL (ref 0.50–1.35)
GFR calc Af Amer: >90 mL/min (ref >90)
GFR, EST NON AFRICAN AMERICAN: 79 mL/min — AB (ref >90)
Glucose, Bld: 111 mg/dL — ABNORMAL HIGH (ref 70–99)
POTASSIUM: 3.9 mmol/L (ref 3.5–5.1)
Sodium: 139 mmol/L (ref 135–145)

## 2015-02-25 LAB — GLUCOSE, CAPILLARY
GLUCOSE-CAPILLARY: 105 mg/dL — AB (ref 70–99)
GLUCOSE-CAPILLARY: 111 mg/dL — AB (ref 70–99)
GLUCOSE-CAPILLARY: 93 mg/dL (ref 70–99)

## 2015-02-25 MED ORDER — METOPROLOL TARTRATE 12.5 MG HALF TABLET
12.5000 mg | ORAL_TABLET | Freq: Two times a day (BID) | ORAL | Status: DC
  Administered 2015-02-25 – 2015-02-27 (×4): 12.5 mg via ORAL
  Filled 2015-02-25 (×5): qty 1

## 2015-02-25 MED ORDER — HALOPERIDOL LACTATE 5 MG/ML IJ SOLN
INTRAMUSCULAR | Status: AC
  Filled 2015-02-25: qty 1

## 2015-02-25 MED ORDER — LORAZEPAM 2 MG/ML IJ SOLN
INTRAMUSCULAR | Status: AC
  Filled 2015-02-25: qty 1

## 2015-02-25 MED ORDER — LORAZEPAM 2 MG/ML IJ SOLN
2.0000 mg | INTRAMUSCULAR | Status: DC | PRN
  Administered 2015-02-25 – 2015-03-01 (×16): 2 mg via INTRAVENOUS
  Filled 2015-02-25 (×15): qty 1

## 2015-02-25 MED ORDER — ONDANSETRON HCL 4 MG/2ML IJ SOLN
INTRAMUSCULAR | Status: AC
  Administered 2015-02-25: 4 mg
  Filled 2015-02-25: qty 2

## 2015-02-25 MED ORDER — ONDANSETRON HCL 4 MG/2ML IJ SOLN
4.0000 mg | Freq: Three times a day (TID) | INTRAMUSCULAR | Status: DC | PRN
  Administered 2015-02-25 – 2015-02-28 (×2): 4 mg via INTRAVENOUS
  Filled 2015-02-25: qty 2

## 2015-02-25 MED ORDER — LEVETIRACETAM 500 MG PO TABS
500.0000 mg | ORAL_TABLET | Freq: Two times a day (BID) | ORAL | Status: DC
  Administered 2015-02-25 – 2015-03-01 (×9): 500 mg via ORAL
  Filled 2015-02-25 (×10): qty 1

## 2015-02-25 MED ORDER — CLONAZEPAM 0.5 MG PO TABS
0.5000 mg | ORAL_TABLET | Freq: Two times a day (BID) | ORAL | Status: DC
  Administered 2015-02-25 – 2015-03-01 (×9): 0.5 mg via ORAL
  Filled 2015-02-25 (×9): qty 1

## 2015-02-25 MED ORDER — FOLIC ACID 1 MG PO TABS
1.0000 mg | ORAL_TABLET | Freq: Every day | ORAL | Status: DC
  Administered 2015-02-25 – 2015-03-01 (×5): 1 mg via ORAL
  Filled 2015-02-25 (×5): qty 1

## 2015-02-25 MED ORDER — LORAZEPAM 2 MG/ML IJ SOLN
1.0000 mg | Freq: Once | INTRAMUSCULAR | Status: AC
  Administered 2015-02-25: 1 mg via INTRAVENOUS
  Filled 2015-02-25: qty 1

## 2015-02-25 MED ORDER — CLONIDINE HCL 0.2 MG PO TABS
0.2000 mg | ORAL_TABLET | Freq: Three times a day (TID) | ORAL | Status: DC
  Administered 2015-02-25 – 2015-03-01 (×11): 0.2 mg via ORAL
  Filled 2015-02-25 (×15): qty 1

## 2015-02-25 MED ORDER — RISPERIDONE 1 MG PO TABS
1.0000 mg | ORAL_TABLET | Freq: Two times a day (BID) | ORAL | Status: DC
Start: 2015-02-25 — End: 2015-02-26
  Administered 2015-02-25: 1 mg via ORAL
  Filled 2015-02-25 (×3): qty 1

## 2015-02-25 MED ORDER — HALOPERIDOL LACTATE 5 MG/ML IJ SOLN
1.0000 mg | Freq: Once | INTRAMUSCULAR | Status: AC
  Administered 2015-02-25: 1 mg via INTRAVENOUS

## 2015-02-25 MED ORDER — METOPROLOL TARTRATE 1 MG/ML IV SOLN
5.0000 mg | Freq: Four times a day (QID) | INTRAVENOUS | Status: DC | PRN
  Administered 2015-02-25 – 2015-02-26 (×2): 5 mg via INTRAVENOUS
  Filled 2015-02-25 (×2): qty 5

## 2015-02-25 MED ORDER — FUROSEMIDE 40 MG PO TABS
40.0000 mg | ORAL_TABLET | Freq: Every day | ORAL | Status: DC
  Administered 2015-02-26 – 2015-03-01 (×4): 40 mg via ORAL
  Filled 2015-02-25 (×5): qty 1

## 2015-02-25 MED ORDER — RISPERIDONE 0.5 MG PO TABS
0.5000 mg | ORAL_TABLET | Freq: Two times a day (BID) | ORAL | Status: DC
  Filled 2015-02-25 (×2): qty 1

## 2015-02-25 NOTE — Progress Notes (Signed)
[facility] Progress Note Patient Name: Natnael Biederman DOB: 12-Oct-1971 MRN: [id number]   Date of Service  02/25/2015  HPI/Events of Note  s tach -on aten @home   Hypoxic  -on   eICU Interventions  PCXR Lopressor prn     Intervention Category Major Interventions: Other: Intermediate Interventions: Other:  ALVA,RAKESH V. 02/25/2015, 1:48 AM

## 2015-02-25 NOTE — Progress Notes (Signed)
PULMONARY / CRITICAL CARE MEDICINE   Name: Richard [name] MRN: [id number] DOB: [birth year]    ADMISSION DATE:  02/11/2015 CONSULTATION DATE:  02/13/2015  REFERRING MD :  [facility]  CHIEF COMPLAINT: Change in mental status  INITIAL PRESENTATION:  44 yo male presented with altered mental status and alcohol intoxication.  He developed DT's.  He has hx of TBI, seizures, ETOH, cocaine.  STUDIES:  2/29 CT head >> Lt frontal encephalomalacia 3/01 EEG >> normal sleep EEG 3/02 Echo >> EF 60 to 73%, grade 1 diastolic dysfx 2/20 EEG >> generalized slowing 3/04 EEG >> generalized slowing 3/04 MRI brain >> Lt frontal encephalomalacia 3/08 Doppler legs >> DVT Rt posterior tibial vein  SIGNIFICANT EVENTS: 2/29 Admit, neuro consulted 3/02 Start precedex 3/11 results of RLE DVT +, LMWH started.  3/12 trying to decrease sedation Awaiting [place] LUE.Marland KitchenPossilbe DVT. Requiring High FIO2  3/13: now on [place], tolerated dose reduction of Risperdal and clonopin. Eating regular diet. PT/OT ordered.    Culture data Urine 3/01 >> Coag neg staph Blood 3/03 >> 2/2 Coag neg staph Blood 3/07 >> NEG  SUBJECTIVE:  More agitated   VITAL SIGNS: Temp:  [98.1 F (36.7 C)-99.4 F (37.4 C)] 98.9 F (37.2 C) (03/14 0731) Pulse Rate:  [73-124] 100 (03/14 0731) Resp:  [9-22] 13 (03/14 0731) BP: (117-189)/(65-107) 123/74 mmHg (03/14 0731) SpO2:  [70 %-99 %] 82 % (03/14 0731) Weight:  [94.348 kg (208 lb)] 94.348 kg (208 lb) (03/14 0409) INTAKE / OUTPUT:  Intake/Output Summary (Last 24 hours) at 02/25/15 1026 Last data filed at 02/25/15 0700  Gross per 24 hour  Intake    520 ml  Output   1525 ml  Net  -1005 ml    PHYSICAL EXAMINATION: General: awake, thankful to staff. Cooperative  Neuro: RASS 0. Sp more clear, moves all ext. Oriented x2.  HEENT: sclera injected Cardiovascular: regualr Lungs: basilar crackles, no accessory muscle use  Abdomen: soft, non tender Musculoskeletal: 1+ edema Skin: no rashes. Marked LUE  swelling much greater in circumference than right   LABS:  CBC  Recent Labs Lab 02/21/15 0657 02/22/15 0204 02/24/15 0913  WBC 9.3 8.7 8.0  HGB 16.3 16.7 17.1*  HCT 47.8 49.1 51.0  PLT 291 294 336   BMET  Recent Labs Lab 02/22/15 0204 02/23/15 0343 02/25/15 0423  NA 138 140 139  K 3.8 3.6 3.9  CL 100 101 99  CO2 32 32 31  BUN 11 14 15   CREATININE 0.96 0.89 1.11  GLUCOSE 120* 143* 111*   Electrolytes  Recent Labs Lab 02/21/15 0657 02/22/15 0204 02/23/15 0343 02/25/15 0423  CALCIUM 8.6 8.8 9.0 9.0  MG 1.8 1.9  --   --   PHOS  --  3.4  --   --    Liver Enzymes  Recent Labs Lab 02/21/15 0657 02/22/15 0204  AST 20 20  ALT 21 21  ALKPHOS 65 73  BILITOT 0.6 0.5  ALBUMIN 2.6* 2.9*   Glucose  Recent Labs Lab 02/23/15 2009 02/24/15 0026 02/24/15 0318 02/24/15 0702 02/24/15 1147 02/24/15 1455  GLUCAP 119* 196* 129* 139* 109* 93    Imaging No results found.   ASSESSMENT / PLAN:  Acute encephalopathy 2nd to ETOH withdrawal with DT's. Hx of TBI, seizure disorder. Acute hypoxic respiratory failure >> Presumed from pulmonary edema. Possible PE/ / RLE DVT Possibly LUE DVT HTN  Staph bacteremia.   He look a little worse. Had reduced sedating meds significantly. He's more agitated today than  yesterday and more hypoxic which may be a significant contributing factor. He drops down into the 70s on room air, this is different than yesterday. ? Could he have a large clot burden w/ presumptive PE.    Plan:   Continue lasix Oxygen to keep SpO2 > 92% F/u CXR intermittently CT angio r/o DVT... May need filter if large clot burden.  won't change pharmacological rx but would change IV access choice  F/u CBC Therapeutic LMWH w/ TEDs hose.  Will need to decide on oral rx when encephalopathy resolves. Doubt good coumadin candidate given etoh abuse. Day 11/14 of Abx, changed to ancef 3/10 RASS goal 0 Cont scheduled risperidone, and clonazepam; have  increased risperidone to 1mg  bid. Cont Keppra, Thiamine, folic acid Needs psych consult   Erick Colace ACNP-BC [place] Pager # [phone] OR # [phone] if no answer  Called back by [facility] due to worsening hypoxemia and worsening delirium and to address anti-coagulation.  I have personally reviewed CXR and I see no evidence of infiltrate.  So hypoxemia is likely related to PE vs aspiration pneumonitis.  No CT has been done to determine clot burden.  See plan below.  Delirium is fluctuating.  I have discussed with Dr. Dyann Kief see plan below.  ETOH withdrawal.  Delirium due to etoh. On scheduled risperidone and clonazepam. Will increase risperidone to 1 mg BID. Recommend psych consultation. Does not need precedex at this time. Landscape architect. Hold in SDU.  Hypoxemia Chest CT to determine clot burden, if high then will likely need an IVC filter. Monitor for signs of aspiration. Supplemental O2, now on 4L. Will likely need some home O2 when ready for discharge. Continue anti-coagulation until CT is done.  DVT/PE CT as above. Heparin drip. Not a candidate for coumadin given etoh abuse. Will need to discuss options of anti-coag prior to discharge.  Patient seen and examined, agree with above note.  I dictated the care and orders written for this patient under my direction.  Rush Farmer, MD [phone]

## 2015-02-25 NOTE — Progress Notes (Signed)
CSW returned call re: pt's need for IVC paperwork.  Per NS, pt's nurse in room with pt and she would let her know that CSW called.

## 2015-02-25 NOTE — Progress Notes (Signed)
[facility] PROGRESS NOTE  Richard [name] TDV:[id number] DOB: 1971/08/22 DOA: 02/11/2015 PCP: [name], MD  Assessment/Plan: 1-acute encephalopathy 2/2 to ETOH withdrawal and DT's -patient completed almost 2 weeks of precedex -now weaned off sedation meds -will continue risperdal, adjust ativan medication/frequency and continue klonopin -psych has been consulted (mainly to help with delirium and to assess for competency/capacity, patient threading to leave AMA; might need IVC)  2-hx of TBI and seizure disorder: continue Keppra  3-acute resp failure with hypoxia: due to Pulmonary edema and presumed PE -continue oxygen supplementation -will check CTA -continue anticoagulation -might need IVC filter if big burden clot found -continue lasix   4-acute RLE DVT, LUE SVT and presumed PE:  -continue anticoagulation -check CTA -need to discussed best agent for anticoagulation once encephalopathy improved  5-ETOH abuse: Patient has actively experienced detox/withdrawal during this admission -continue ativan, klonopin and risperdal -psych consulted today, will follow any rec's fro ongoing DT's -continue thiamine and folic acid  6-coagulase neg staph bacteremia: 2/2 blood cx's -treatment is for a total of 14 days (coursing days 11/14) -patient on ancef -no fever  7-hypokalemia and low Mg: repleted -will monitor electrolytes  8-physical deconditioning: PT has been ordered; will follow rec's  9-HTN and Tachycardia:  -continue Clonidine, lopressor and lasix  Code Status: Full Family Communication: no family at bedside Disposition Plan: remains in stepdown; follow CTA   Consultants:  PCCM  Psychiatry  STUDIES:  2/29 CT head >> Lt frontal encephalomalacia 3/01 EEG >> normal sleep EEG 3/02 Echo >> EF 60 to 06%, grade 1 diastolic dysfx 2/69 EEG >> generalized slowing 3/04 EEG >> generalized slowing 3/04 MRI brain >> Lt frontal encephalomalacia 3/08 Doppler legs >>  DVT Rt posterior tibial vein  SIGNIFICANT EVENTS: 2/29 Admit, neuro consulted 3/02 Start precedex 3/11 results of RLE DVT +, LMWH started.  3/12 trying to decrease sedation Awaiting [place] LUE.Marland KitchenPossilbe DVT. Requiring High FIO2  3/13: now on [place], tolerated dose reduction of Risperdal and clonopin. Eating regular diet. PT/OT ordered.   Culture data Urine 3/01 >> Coag neg staph Blood 3/03 >> 2/2 Coag neg staph Blood 3/07 >> NEG  Antibiotics:  Vancomycin/ancef now for staph coagulase neg bacteremia (plan is to complete 14 days treatment)  HPI/Subjective: Afebrile, patient with O2 desaturation while not wearing oxygen supplementation; tangential and very anxious.  Objective: Filed Vitals:   02/25/15 1128  BP: 142/83  Pulse: 95  Temp: 98.3 F (36.8 C)  Resp: 16    Intake/Output Summary (Last 24 hours) at 02/25/15 1552 Last data filed at 02/25/15 1253  Gross per 24 hour  Intake    520 ml  Output    925 ml  Net   -405 ml   Filed Weights   02/23/15 0500 02/24/15 0500 02/25/15 0409  Weight: 95.255 kg (210 lb) 97.523 kg (215 lb) 94.348 kg (208 lb)    Exam:   General:  Anxious, tangential and threading to leave AMA; patient taking off venturi mask and telemetry  Cardiovascular: tachycardic, no rubs or gallops, no murmurs   Respiratory: god air movement, positive rhonchi; no wheezing  Abdomen: soft, NT, ND, positive BS  Musculoskeletal:left upper extremity is swollen and tender; no Le edema or cyanosis appreciated  Data Reviewed: Basic Metabolic Panel:  Recent Labs Lab 02/21/15 0657 02/22/15 0204 02/23/15 0343 02/25/15 0423  NA 139 138 140 139  K 3.5 3.8 3.6 3.9  CL 107 100 101 99  CO2 28 32 32 31  GLUCOSE 123* 120* 143* 111*  BUN 15 11 14 15   CREATININE 0.95 0.96 0.89 1.11  CALCIUM 8.6 8.8 9.0 9.0  MG 1.8 1.9  --   --   PHOS  --  3.4  --   --    Liver Function Tests:  Recent Labs Lab 02/21/15 0657 02/22/15 0204  AST 20 20  ALT 21 21  ALKPHOS 65 73   BILITOT 0.6 0.5  PROT 6.2 6.8  ALBUMIN 2.6* 2.9*   CBC:  Recent Labs Lab 02/21/15 0657 02/22/15 0204 02/24/15 0913  WBC 9.3 8.7 8.0  HGB 16.3 16.7 17.1*  HCT 47.8 49.1 51.0  MCV 94.7 93.0 96.6  PLT 291 294 336   ProBNP (last 3 results)  Recent Labs  06/20/14 0558 08/15/14 1244  PROBNP 13.5 36.0    CBG:  Recent Labs Lab 02/24/15 0702 02/24/15 1147 02/24/15 1455 02/24/15 2026 02/25/15 0435  GLUCAP 139* 109* 93 105* 111*    Recent Results (from the past 240 hour(s))  Culture, blood (routine x 2)     Status: None   Collection Time: 02/18/15 12:05 PM  Result Value Ref Range Status   Specimen Description BLOOD RIGHT HAND FINGER  Final   Special Requests BOTTLES DRAWN AEROBIC ONLY 1CC  Final   Culture   Final    NO GROWTH 5 DAYS Performed at [facility]    Report Status 02/24/2015 FINAL  Final  Culture, blood (routine x 2)     Status: None   Collection Time: 02/18/15 12:10 PM  Result Value Ref Range Status   Specimen Description BLOOD RIGHT HAND  Final   Special Requests BOTTLES DRAWN AEROBIC ONLY [place]  Final   Culture   Final    NO GROWTH 5 DAYS Performed at [facility]    Report Status 02/24/2015 FINAL  Final     Studies: Dg Chest Port 1 View  02/25/2015   CLINICAL DATA:  Hypoxia  EXAM: PORTABLE CHEST - 1 VIEW  COMPARISON:  02/23/2015  FINDINGS: A single AP portable view of the chest demonstrates near complete clearance of edema with only minimal residual opacity in the left base. There is no large effusion or pneumothorax. Cardiac and mediastinal contours appear unremarkable.  IMPRESSION: Significantly improved, with nearly complete clearance of edema.   Electronically Signed   By: Andreas Newport M.D.   On: 02/25/2015 05:26    Scheduled Meds: . antiseptic oral rinse  7 mL Mouth Rinse q12n4p  .  ceFAZolin (ANCEF) IV  2 g Intravenous 3 times per day  . chlorhexidine  15 mL Mouth Rinse BID  . clonazePAM  0.5 mg Oral BID  .  cloNIDine  0.2 mg Oral TID  . enoxaparin (LOVENOX) injection  95 mg Subcutaneous BID  . folic acid  1 mg Oral Daily  . levETIRAcetam  500 mg Oral BID  . nicotine  21 mg Transdermal Daily  . potassium chloride  40 mEq Oral Daily  . risperiDONE  1 mg Oral BID  . sodium chloride  10-40 mL Intracatheter Q12H  . sodium chloride  3 mL Intravenous Q12H  . thiamine  100 mg Oral Daily   Continuous Infusions: . sodium chloride 10 mL/hr (02/24/15 2200)    Active Problems:   Encephalopathy   Alcohol intoxication   Lactic acidosis   Altered mental status   Metabolic acidosis   Essential hypertension   Acute respiratory failure with hypoxia   Acute respiratory failure with hypoxemia   Alcohol withdrawal delirium  Bacteremia due to Staphylococcus   Protein-calorie malnutrition, severe    Time spent: 40 minutes (> 50% of time dedicated with face to face evaluation, care coordination and discussion of case with consultants)    [place] [place] [facility] Pager [phone]. If 7PM-7AM, please contact night-coverage at [email or web address], password [facility] 02/25/2015, 3:52 PM  LOS: 14 days

## 2015-02-25 NOTE — Evaluation (Signed)
Physical Therapy Evaluation Patient Details Name: Richard [name] MRN: [id number] DOB: 30-Dec-1970 Today's Date: 02/25/2015   History of Present Illness  44 yo male presented with altered mental status and alcohol intoxication. He developed DT's. He has hx of TBI, seizures, ETOH, cocaine.  Clinical Impression  Patient demonstrates deficits in functional mobility as indicated below. Will need continued skilled PT to address deficits and maximize function. Will see as indicated and progress as tolerated.     Follow Up Recommendations SNF;Supervision/Assistance - 24 hour    Equipment Recommendations  Other (comment) (TBD pending progress)    Recommendations for Other Services       Precautions / Restrictions Precautions Precautions: Fall Restrictions Weight Bearing Restrictions: No      Mobility  Bed Mobility Overal bed mobility: Needs Assistance;+2 for physical assistance Bed Mobility: Supine to Sit;Sit to Supine     Supine to sit: Mod assist Sit to supine: Mod assist   General bed mobility comments: Increased assist to elevate to upright and scoot to EOB, assist to bring LEs back to bed.  Transfers Overall transfer level: Needs assistance Equipment used: 2 person hand held assist Transfers: Sit to/from Stand Sit to Stand: Min assist;+2 physical assistance            Ambulation/Gait Ambulation/Gait assistance: Mod assist;+2 physical assistance Ambulation Distance (Feet): 6 Feet Assistive device: 2 person hand held assist Gait Pattern/deviations: Ataxic Gait velocity: decreased Gait velocity interpretation: Below normal speed for age/gender General Gait Details: Significantly unsteady, BLE buckling and poor coordination of gait.  Stairs            Wheelchair Mobility    Modified Rankin (Stroke Patients Only)       Balance Overall balance assessment: Needs assistance Sitting-balance support: Feet supported Sitting balance-Leahy Scale: Fair      Standing balance support: Bilateral upper extremity supported;During functional activity Standing balance-Leahy Scale: Fair Standing balance comment: assist for stability, poor coordination of midline, insability to stand without physical assist at this time                             Pertinent Vitals/Pain Pain Assessment: No/denies pain    Home Living                   Additional Comments: pt poor hisorian, poor communication due to lethargy and AMS    Prior Function                 Hand Dominance        Extremity/Trunk Assessment   Upper Extremity Assessment: Defer to OT evaluation           Lower Extremity Assessment: Generalized weakness (ataxic movements)         Communication      Cognition Arousal/Alertness: Lethargic Behavior During Therapy: Flat affect Overall Cognitive Status: No family/caregiver present to determine baseline cognitive functioning                      General Comments      Exercises        Assessment/Plan    PT Assessment Patient needs continued PT services  PT Diagnosis Difficulty walking;Abnormality of gait;Altered mental status   PT Problem List Decreased strength;Decreased range of motion;Decreased activity tolerance;Decreased balance;Decreased mobility;Decreased coordination;Decreased cognition;Decreased knowledge of use of DME;Decreased safety awareness;Cardiopulmonary status limiting activity  PT Treatment Interventions DME instruction;Gait training;Functional mobility training;Therapeutic activities;Therapeutic exercise;Balance training;Patient/family education  PT Goals (Current goals can be found in the Care Plan section) Acute Rehab PT Goals Patient Stated Goal: none stated PT Goal Formulation: Patient unable to participate in goal setting Time For Goal Achievement: 03/11/15 Potential to Achieve Goals: Good    Frequency Min 3X/week   Barriers to discharge         Co-evaluation               End of Session Equipment Utilized During Treatment: Gait belt;Oxygen Activity Tolerance: Patient limited by fatigue;Patient limited by lethargy Patient left: in bed;with call bell/phone within reach;with bed alarm set Nurse Communication: Mobility status         Time: 6789-3810 PT Time Calculation (min) (ACUTE ONLY): 20 min   Charges:   PT Evaluation $Initial PT Evaluation Tier I: 1 Procedure     PT G CodesDuncan Dull March 17, 2015, 3:31 PM Alben Deeds, [place] DPT  [phone]

## 2015-02-25 NOTE — Progress Notes (Signed)
*  PRELIMINARY RESULTS* Vascular Ultrasound Bilateral upper extremity venous duplex has been completed.  Preliminary findings: Negative for DVT. Superficial thrombosis is noted in the right cephalic vein at the [facility].   Landry Mellow, RDMS, RVT  02/25/2015, 8:52 AM

## 2015-02-26 ENCOUNTER — Inpatient Hospital Stay (HOSPITAL_COMMUNITY): Payer: Medicare Other

## 2015-02-26 DIAGNOSIS — R4182 Altered mental status, unspecified: Secondary | ICD-10-CM

## 2015-02-26 LAB — GLUCOSE, CAPILLARY
GLUCOSE-CAPILLARY: 93 mg/dL (ref 70–99)
GLUCOSE-CAPILLARY: 111 mg/dL — AB (ref 70–99)
Glucose-Capillary: 100 mg/dL — ABNORMAL HIGH (ref 70–99)
Glucose-Capillary: 114 mg/dL — ABNORMAL HIGH (ref 70–99)
Glucose-Capillary: 120 mg/dL — ABNORMAL HIGH (ref 70–99)
Glucose-Capillary: 87 mg/dL (ref 70–99)

## 2015-02-26 MED ORDER — IOHEXOL 350 MG/ML SOLN
100.0000 mL | Freq: Once | INTRAVENOUS | Status: AC | PRN
  Administered 2015-02-26: 100 mL via INTRAVENOUS

## 2015-02-26 MED ORDER — QUETIAPINE FUMARATE 50 MG PO TABS
50.0000 mg | ORAL_TABLET | Freq: Two times a day (BID) | ORAL | Status: DC
  Administered 2015-02-26 – 2015-03-01 (×7): 50 mg via ORAL
  Filled 2015-02-26 (×8): qty 1

## 2015-02-26 NOTE — Progress Notes (Signed)
Shift Event: RN found pt on floor. Pt states he fell getting out of bed. Did not hurt head, no LOC, did not sustain any injuries.   Lacy Duverney [facility]

## 2015-02-26 NOTE — Progress Notes (Signed)
RN went into room to assess pt and found pt sitting on the floor. No witness as to how pt got on floor. Pt stated he "did a 360." Pt was wearing non-slip socks and bed alarm was on. Pt was put back into bed; not complaining of any pain, and does not appear to have sustained any injuries. VS stable. Family and attending PA notified. Pt currently talking on phone with family. Safety sitter orders already in place but no sitter this PM. NT sitting with pt at this time. Will continue to closely monitor pt.

## 2015-02-26 NOTE — Progress Notes (Signed)
Pt's mom at bedside. Educated family on falls and use of call bell. Pt demonstrated use of call bell and verbalized understanding. Will continue to monitor pt.

## 2015-02-26 NOTE — Consult Note (Signed)
[facility] Consult   Reason for Consult:  Capacity evaluation Referring Physician:  Dr. Dyann Kief Patient Identification: Richard [name] MRN:  [id number] Principal Diagnosis: Alcohol withdrawal delirium Diagnosis:   Patient Active Problem List   Diagnosis Date Noted  . Seizures [R56.9]   . Acute pulmonary edema [J81.0]   . Lower leg DVT (deep venous thromboembolism), acute [I82.4Z9]   . Protein-calorie malnutrition, severe [E43] 02/19/2015  . Bacteremia due to Staphylococcus [R78.81, B95.8] 02/17/2015  . Alcohol withdrawal delirium [F10.231]   . Acute respiratory failure with hypoxia [J96.01] 02/13/2015  . Acute respiratory failure with hypoxemia [J96.01]   . Altered mental status [R41.82]   . Metabolic acidosis [C37.6]   . Essential hypertension [I10]   . Encephalopathy [G93.40] 02/11/2015  . Alcohol intoxication [F10.129] 02/11/2015  . Lactic acidosis [E87.2] 02/11/2015  . Bleeding from the nose [R04.0] 03/19/2014  . TIA (transient ischemic attack) [G45.9] 12/24/2013  . Weakness [R53.1] 12/23/2013  . Left-sided weakness [M62.89] 12/23/2013  . Malignant hypertension [I10] 12/23/2013  . Seizure disorder [G40.909] 12/23/2013  . Vomiting [R11.10] 06/18/2013  . Chest pain [R07.9] 06/18/2013  . Hypertensive urgency [I10] 10/30/2012  . Migraine variant [G43.809] 10/30/2012  . TOBACCO ABUSE [Z72.0] 01/11/2008  . HYPERTENSION, BENIGN [I10] 11/24/2007  . CHRONIC FRONTAL SINUSITIS [J32.1] 11/24/2007  . SEIZURE DISORDER [R56.9] 11/24/2007  . Headache(784.0) [R51] 11/24/2007    Total Time spent with patient: 30 minutes  Subjective:   Richard [name] is a 44 y.o. male patient admitted with delirium and polysubstance abuse.  HPI:  Richard [name] is a 44 years old African-American male seen and chart reviewed for psychiatric consultation and evaluation of alcohol withdrawal delirium and possible competency evaluation. Patient presented with alcohol intoxication, and  agitation/combative behavior and decreased level of consciousness and has history of seizures. Information for this evaluation obtained from available medical records and case discussed with the patient mother and staff RN. Patient has been drinking 40 ounces of beer at least 2 of them daily. He lives with his wife and he has a 68 years old son. Patient has been suffering with traumatic brain injury since he had an accident several years ago. Patient is consistent disabled and unable to work outside home. At his baseline patient is able to do so surrounding the house including cutting the grass. Patient was on sedation secondary to giving the Ativan for increased agitation, pulling wires and trying to get out of the bed this morning. 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  Psychiatric consultation will follow up with him tomorrow when he was more awake and able to contribute regarding capacity evaluation.  Medical history: This is a 44 y.o. year old male with significant past medical history of seizure disorder, TIA, polysubstance abuse, HTN w/ hx/o malignant HTN presenting with encephalopathy, alcohol intoxication, lactic acidosis. Per report, EMS was called for patient for multiple nonspecific complaints and including possible STEMI, hematemesis. Per report, on arrival EMS noted patient to be combative. Noted intermittent episodes of loss of consciousness as well as reports of chest pain back pain and arm pain. Was initially refusing transport. However, patient was able to be transported. EKG of time was concerning for? STEMI. However, EKG was consistent to prior tracings in the past. Patient noted recurrent episodes of encephalopathy, combativeness, loss of consciousness/staring spells concerning for seizure. Hemodynamically stable on presentation. Hemoccult negative. Head CT within normal limits. Notable findings  include  lactic acidosis with lactate of 3 and bicarbonate of 16. Ethanol level of 159. LFTs within normal limits. UDS pending. Dilantin level pending.  HPI Elements:   Location:  Substance abuse, and traumatic brain injury. Quality:  Poor. Severity:  Agitation secondary to delirium. Timing:  Alcohol withdrawal. Duration:  Intoxication on arrival. Context:  Alcohol withdrawal delirium.  Past Medical History:  Past Medical History  Diagnosis Date  . Hypertension   . Seizures     last episode 03/2013  . Asthma   . Brain bleed     Past Surgical History  Procedure Laterality Date  . Leg surgery     Family History:  Family History  Problem Relation Age of Onset  . Diabetes Mellitus II Sister   . Cancer Father    Social History:  History  Alcohol Use  . 2.4 oz/week  . 4 Cans of beer per week    Comment: drinks heavily. "pt sts he only drinks one beer a day now"     History  Drug Use  . Yes  . Special: Cocaine    Comment: last use several years ago     History   Social History  . Marital Status: Married    Spouse Name: Lattie Haw  . Number of Children: 1  . Years of Education: HS   Occupational History  .  Other    n/a   Social History Main Topics  . Smoking status: Current Every Day Smoker -- 1.00 packs/day for 26 years    Types: Cigarettes  . Smokeless tobacco: Never Used  . Alcohol Use: 2.4 oz/week    4 Cans of beer per week     Comment: drinks heavily. "pt sts he only drinks one beer a day now"  . Drug Use: Yes    Special: Cocaine     Comment: last use several years ago   . Sexual Activity: Yes   Other Topics Concern  . None   Social History Narrative   Patient lives at home alone.   Caffeine Use: 4-5 cups daily   Additional Social History:                          Allergies:   Allergies  Allergen Reactions  . Dilaudid [Hydromorphone Hcl] Other (See Comments)    Bradycardia and Nausea    Vitals: Blood pressure 138/85, pulse 85, temperature  98.7 F (37.1 C), temperature source Axillary, resp. rate 15, height 6' (1.829 m), weight 92.987 kg (205 lb), SpO2 100 %.  Risk to Self: Is patient at risk for suicide?: No Risk to Others:   Prior Inpatient Therapy:   Prior Outpatient Therapy:    Current Facility-Administered Medications  Medication Dose Route Frequency Provider Last Rate Last Dose  . 0.9 %  sodium chloride infusion   Intravenous Continuous PRN [name], MD 10 mL/hr at 02/24/15 2200 10 mL/hr at 02/24/15 2200  . antiseptic oral rinse (CPC / CETYLPYRIDINIUM CHLORIDE 0.05%) solution 7 mL  7 mL Mouth Rinse q12n4p Brand Males, MD   7 mL at 02/25/15 1200  . ceFAZolin (ANCEF) IVPB 2 g/50 mL premix  2 g Intravenous 3 times per day Thuy D Dang, RPH   2 g at 02/26/15 0457  . chlorhexidine (PERIDEX) 0.12 % solution 15 mL  15 mL Mouth Rinse BID Brand Males, MD   15 mL at 02/26/15 0849  . clonazePAM (KLONOPIN) tablet 0.5 mg  0.5 mg  Oral BID Barton Dubois, MD   0.5 mg at 02/25/15 2255  . cloNIDine (CATAPRES) tablet 0.2 mg  0.2 mg Oral TID Barton Dubois, MD   0.2 mg at 02/25/15 2238  . enoxaparin (LOVENOX) injection 95 mg  95 mg Subcutaneous BID Tyrone Apple, RPH   95 mg at 02/25/15 2240  . folic acid (FOLVITE) tablet 1 mg  1 mg Oral Daily Barton Dubois, MD   1 mg at 02/25/15 1203  . furosemide (LASIX) tablet 40 mg  40 mg Oral Daily Barton Dubois, MD   40 mg at 02/25/15 1730  . levETIRAcetam (KEPPRA) tablet 500 mg  500 mg Oral BID Barton Dubois, MD   500 mg at 02/25/15 2239  . LORazepam (ATIVAN) injection 2 mg  2 mg Intravenous Q3H PRN Barton Dubois, MD   2 mg at 02/26/15 0911  . metoprolol (LOPRESSOR) injection 5 mg  5 mg Intravenous Q6H PRN Rigoberto Noel, MD   5 mg at 02/25/15 0210  . metoprolol tartrate (LOPRESSOR) tablet 12.5 mg  12.5 mg Oral BID Barton Dubois, MD   12.5 mg at 02/25/15 2239  . nicotine (NICODERM CQ - dosed in mg/24 hours) patch 21 mg  21 mg Transdermal Daily Erick Colace, NP   21 mg at 02/25/15 1205  .  ondansetron (ZOFRAN) injection 4 mg  4 mg Intravenous Q8H PRN Brand Males, MD   4 mg at 02/25/15 0131  . potassium chloride SA (K-DUR,KLOR-CON) CR tablet 40 mEq  40 mEq Oral Daily Brand Males, MD   40 mEq at 02/24/15 1259  . QUEtiapine (SEROQUEL) tablet 50 mg  50 mg Oral BID Barton Dubois, MD      . sodium chloride 0.9 % injection 10-40 mL  10-40 mL Intracatheter Q12H Md Pccm, MD   10 mL at 02/25/15 2253  . sodium chloride 0.9 % injection 10-40 mL  10-40 mL Intracatheter PRN Md Pccm, MD      . sodium chloride 0.9 % injection 3 mL  3 mL Intravenous Q12H Deneise Lever, MD   3 mL at 02/25/15 2253  . thiamine (VITAMIN B-1) tablet 100 mg  100 mg Oral Daily Brand Males, MD   100 mg at 02/25/15 1203    Musculoskeletal: Strength & Muscle Tone: decreased Gait & Station: unable to stand Patient leans: N/A  Psychiatric Specialty Exam: Physical Exam  ROS  Blood pressure 138/85, pulse 85, temperature 98.7 F (37.1 C), temperature source Axillary, resp. rate 15, height 6' (1.829 m), weight 92.987 kg (205 lb), SpO2 100 %.Body mass index is 27.8 kg/(m^2).  General Appearance: Disheveled and Guarded  Eye Contact::  NA  Speech:  NA  Volume:  Decreased  Mood:  NA  Affect:  NA  Thought Process:  NA  Orientation:  NA  Thought Content:  NA  Suicidal Thoughts:  No  Homicidal Thoughts:  No  Memory:  Negative NA  Judgement:  NA  Insight:  NA  Psychomotor Activity:  NA  Concentration:  NA  Recall:  NA  Fund of Knowledge:NA  Language: NA  Akathisia:  NA  Handed:  Right  AIMS (if indicated):     Assets:  Others:  unable to assess in this time  ADL's:  Impaired  Cognition: Impaired,  Moderate  Sleep:      Medical Decision Making: New problem, with additional work up planned, Review of Psycho-Social Stressors (1), Review or order clinical lab tests (1), Established Problem, Worsening (2), Review or order medicine  tests (1), Independent Review of image, tracing or specimen (2) and  Review of New Medication or Change in Dosage (2)  Treatment Plan Summary: Daily contact with patient to assess and evaluate symptoms and progress in treatment and Medication management  Plan:  Alcohol detox treatment with Ativan protocol and CIWA Monitor for withdrawal seizures and hallucinations Patient does not meet criteria for psychiatric inpatient admission. Supportive therapy provided about ongoing stressors. Patient will be referred to the rehabilitation treatment when medically stable Appreciate psychiatric consultation and follow up as clinically required Please contact [phone] or [phone] if needs further assistance  Disposition: Patient might be able to go home when medically stable and also be referred to the outpatient or residential rehabilitation treatment.  Jaquelynn Wanamaker,JANARDHAHA R. 02/26/2015 10:33 AM

## 2015-02-26 NOTE — Progress Notes (Signed)
[facility] PROGRESS NOTE  Richard [name] PJA:[id number] DOB: 07-09-1971 DOA: 02/11/2015 PCP: [name], MD  Assessment/Plan: 1-acute encephalopathy 2/2 to ETOH withdrawal and DT's -patient completed almost 2 weeks of precedex -patient mentation affected due to withdrawal and potential hospital acquired delirium. -will start seroquel and d/c risperdal, adjust ativan medication/frequency and continue klonopin -psych has been consulted, consultation pending(mainly to help with delirium and to assess for competency/capacity, patient threading to leave AMA; might need IVC)  2-hx of TBI and seizure disorder: continue Keppra  3-acute resp failure with hypoxia: due to Pulmonary edema and presumed PE -continue oxygen supplementation -will check CTA -continue anticoagulation -might need IVC filter if big burden clot found -continue lasix   4-acute RLE DVT, LUE SVT and presumed PE:  -continue anticoagulation, now with lovenox as patient pulling IV and  -check CTA -need to discussed best agent for anticoagulation once encephalopathy improved  5-ETOH abuse/delirium: Patient has actively experienced detox/withdrawal during this admission -continue ativan, klonopin and seroquel now -psych consulted today, will follow any rec's fro ongoing DT's -continue thiamine and folic acid  6-coagulase neg staph bacteremia: 2/2 blood cx's -treatment is for a total of 14 days (coursing days 11/14) -patient on ancef -no fever  7-hypokalemia and low Mg: repleted -will monitor electrolytes  8-physical deconditioning: PT has been ordered; will follow rec's  9-HTN and Tachycardia:  -continue Clonidine, lopressor and lasix  Code Status: Full Family Communication: mother at bedside Disposition Plan: remains in stepdown; follow CTA   Consultants:  PCCM  Psychiatry  STUDIES:  2/29 CT head >> Lt frontal encephalomalacia 3/01 EEG >> normal sleep EEG 3/02 Echo >> EF 60 to 65%, grade 1  diastolic dysfx 3/41 EEG >> generalized slowing 3/04 EEG >> generalized slowing 3/04 MRI brain >> Lt frontal encephalomalacia 3/08 Doppler legs >> DVT Rt posterior tibial vein 3/14: CT angio of chest ordered, but pending.  SIGNIFICANT EVENTS: 2/29 Admit, neuro consulted 3/02 Start precedex 3/11 results of RLE DVT +, LMWH started.  3/12 trying to decrease sedation Awaiting [place] LUE.Marland KitchenPossilbe DVT. Requiring High FIO2  3/13: now on [place], tolerated dose reduction of Risperdal and clonopin. Eating regular diet. PT/OT ordered.  3/14: very agitated, confused, verbally abusive and tangential; threading to leave AMA and impairing care. Patient required multiple doses of ativan and also Haldol, before calming down  Culture data Urine 3/01 >> Coag neg staph Blood 3/03 >> 2/2 Coag neg staph Blood 3/07 >> NEG  Antibiotics:  Vancomycin/ancef now for staph coagulase neg bacteremia (plan is to complete 14 days treatment)  HPI/Subjective: Afebrile, patient somnolent and lethargic this morning. Confused but calm. Mother at bedside   Objective: Filed Vitals:   02/26/15 0735  BP: 138/85  Pulse: 85  Temp: 98.7 F (37.1 C)  Resp: 15    Intake/Output Summary (Last 24 hours) at 02/26/15 0820 Last data filed at 02/25/15 2240  Gross per 24 hour  Intake    140 ml  Output    650 ml  Net   -510 ml   Filed Weights   02/24/15 0500 02/25/15 0409 02/26/15 0401  Weight: 97.523 kg (215 lb) 94.348 kg (208 lb) 92.987 kg (205 lb)    Exam:   General:  Somnolent and sleepy this morning; calm and currently able to follow just simple commands; patient confused. On [place] 4L O2 sat 90-91%  Cardiovascular: tachycardic, no rubs or gallops, no murmurs   Respiratory: god air movement, positive rhonchi; no wheezing  Abdomen: soft, NT, ND, positive BS  Musculoskeletal:left upper extremity is swollen and tender; no Le edema or cyanosis appreciated  Data Reviewed: Basic Metabolic Panel:  Recent Labs Lab  02/21/15 0657 02/22/15 0204 02/23/15 0343 02/25/15 0423  NA 139 138 140 139  K 3.5 3.8 3.6 3.9  CL 107 100 101 99  CO2 28 32 32 31  GLUCOSE 123* 120* 143* 111*  BUN 15 11 14 15   CREATININE 0.95 0.96 0.89 1.11  CALCIUM 8.6 8.8 9.0 9.0  MG 1.8 1.9  --   --   PHOS  --  3.4  --   --    Liver Function Tests:  Recent Labs Lab 02/21/15 0657 02/22/15 0204  AST 20 20  ALT 21 21  ALKPHOS 65 73  BILITOT 0.6 0.5  PROT 6.2 6.8  ALBUMIN 2.6* 2.9*   CBC:  Recent Labs Lab 02/21/15 0657 02/22/15 0204 02/24/15 0913  WBC 9.3 8.7 8.0  HGB 16.3 16.7 17.1*  HCT 47.8 49.1 51.0  MCV 94.7 93.0 96.6  PLT 291 294 336   ProBNP (last 3 results)  Recent Labs  06/20/14 0558 08/15/14 1244  PROBNP 13.5 36.0    CBG:  Recent Labs Lab 02/24/15 2026 02/25/15 0435 02/25/15 1627 02/26/15 0013 02/26/15 0452  GLUCAP 105* 111* 93 120* 87    Recent Results (from the past 240 hour(s))  Culture, blood (routine x 2)     Status: None   Collection Time: 02/18/15 12:05 PM  Result Value Ref Range Status   Specimen Description BLOOD RIGHT HAND FINGER  Final   Special Requests BOTTLES DRAWN AEROBIC ONLY 1CC  Final   Culture   Final    NO GROWTH 5 DAYS Performed at [facility]    Report Status 02/24/2015 FINAL  Final  Culture, blood (routine x 2)     Status: None   Collection Time: 02/18/15 12:10 PM  Result Value Ref Range Status   Specimen Description BLOOD RIGHT HAND  Final   Special Requests BOTTLES DRAWN AEROBIC ONLY [place]  Final   Culture   Final    NO GROWTH 5 DAYS Performed at [facility]    Report Status 02/24/2015 FINAL  Final     Studies: Dg Chest Port 1 View  02/25/2015   CLINICAL DATA:  Hypoxia  EXAM: PORTABLE CHEST - 1 VIEW  COMPARISON:  02/23/2015  FINDINGS: A single AP portable view of the chest demonstrates near complete clearance of edema with only minimal residual opacity in the left base. There is no large effusion or pneumothorax. Cardiac and  mediastinal contours appear unremarkable.  IMPRESSION: Significantly improved, with nearly complete clearance of edema.   Electronically Signed   By: Andreas Newport M.D.   On: 02/25/2015 05:26    Scheduled Meds: . antiseptic oral rinse  7 mL Mouth Rinse q12n4p  .  ceFAZolin (ANCEF) IV  2 g Intravenous 3 times per day  . chlorhexidine  15 mL Mouth Rinse BID  . clonazePAM  0.5 mg Oral BID  . cloNIDine  0.2 mg Oral TID  . enoxaparin (LOVENOX) injection  95 mg Subcutaneous BID  . folic acid  1 mg Oral Daily  . furosemide  40 mg Oral Daily  . levETIRAcetam  500 mg Oral BID  . metoprolol tartrate  12.5 mg Oral BID  . nicotine  21 mg Transdermal Daily  . potassium chloride  40 mEq Oral Daily  . risperiDONE  1 mg Oral BID  . sodium chloride  10-40 mL Intracatheter  Q12H  . sodium chloride  3 mL Intravenous Q12H  . thiamine  100 mg Oral Daily   Continuous Infusions: . sodium chloride 10 mL/hr (02/24/15 2200)    Active Problems:   Encephalopathy   Alcohol intoxication   Lactic acidosis   Altered mental status   Metabolic acidosis   Essential hypertension   Acute respiratory failure with hypoxia   Acute respiratory failure with hypoxemia   Alcohol withdrawal delirium   Bacteremia due to Staphylococcus   Protein-calorie malnutrition, severe   Seizures   Acute pulmonary edema   Lower leg DVT (deep venous thromboembolism), acute    Time spent: 35 minutes (> 50% of time dedicated with face to face evaluation, care coordination and discussion of case with consultants)    Barton Dubois  [facility] Pager [phone]. If 7PM-7AM, please contact night-coverage at [email or web address], password [facility] 02/26/2015, 8:20 AM  LOS: 15 days

## 2015-02-26 NOTE — Progress Notes (Addendum)
PULMONARY / CRITICAL CARE MEDICINE   Name: Richard [name] MRN: [id number] DOB: 06-03-1971    ADMISSION DATE:  02/11/2015 CONSULTATION DATE:  02/13/2015  REFERRING MD :  [facility]  CHIEF COMPLAINT: Change in mental status  INITIAL PRESENTATION:  44 yo male presented with altered mental status and alcohol intoxication.  He developed DT's.  He has hx of TBI, seizures, ETOH, cocaine.  STUDIES:  2/29 CT head >> Lt frontal encephalomalacia 3/01 EEG >> normal sleep EEG 3/02 Echo >> EF 60 to 74%, grade 1 diastolic dysfx 2/59 EEG >> generalized slowing 3/04 EEG >> generalized slowing 3/04 MRI brain >> Lt frontal encephalomalacia 3/08 Doppler legs >> DVT Rt posterior tibial vein  SIGNIFICANT EVENTS: 2/29 Admit, neuro consulted 3/02 Start precedex 3/11 results of RLE DVT +, LMWH started.  3/12 trying to decrease sedation Awaiting [place] LUE.Marland KitchenPossilbe DVT. Requiring High FIO2  3/13: now on [place], tolerated dose reduction of Risperdal and clonopin. Eating regular diet. PT/OT ordered.    Culture data Urine 3/01 >> Coag neg staph Blood 3/03 >> 2/2 Coag neg staph Blood 3/07 >> NEG  SUBJECTIVE:  More agitated   VITAL SIGNS: Temp:  [97.9 F (36.6 C)-98.8 F (37.1 C)] 98.7 F (37.1 C) (03/15 0735) Pulse Rate:  [85-102] 85 (03/15 0735) Resp:  [10-24] 15 (03/15 0735) BP: (127-157)/(70-101) 138/85 mmHg (03/15 0735) SpO2:  [90 %-100 %] 100 % (03/15 0735) Weight:  [92.987 kg (205 lb)] 92.987 kg (205 lb) (03/15 0401) INTAKE / OUTPUT:  Intake/Output Summary (Last 24 hours) at 02/26/15 1132 Last data filed at 02/26/15 0910  Gross per 24 hour  Intake    120 ml  Output    650 ml  Net   -530 ml    PHYSICAL EXAMINATION: General: awake, thankful to staff. Cooperative  Neuro: RASS 0. Sp more clear, moves all ext. Oriented x2.  HEENT: sclera injected Cardiovascular: regualr Lungs: basilar crackles, no accessory muscle use  Abdomen: soft, non tender Musculoskeletal: 1+ edema Skin: no rashes. Marked  LUE swelling much greater in circumference than right   LABS:  CBC  Recent Labs Lab 02/21/15 0657 02/22/15 0204 02/24/15 0913  WBC 9.3 8.7 8.0  HGB 16.3 16.7 17.1*  HCT 47.8 49.1 51.0  PLT 291 294 336   BMET  Recent Labs Lab 02/22/15 0204 02/23/15 0343 02/25/15 0423  NA 138 140 139  K 3.8 3.6 3.9  CL 100 101 99  CO2 32 32 31  BUN 11 14 15   CREATININE 0.96 0.89 1.11  GLUCOSE 120* 143* 111*   Electrolytes  Recent Labs Lab 02/21/15 0657 02/22/15 0204 02/23/15 0343 02/25/15 0423  CALCIUM 8.6 8.8 9.0 9.0  MG 1.8 1.9  --   --   PHOS  --  3.4  --   --    Liver Enzymes  Recent Labs Lab 02/21/15 0657 02/22/15 0204  AST 20 20  ALT 21 21  ALKPHOS 65 73  BILITOT 0.6 0.5  ALBUMIN 2.6* 2.9*   Glucose  Recent Labs Lab 02/24/15 2026 02/25/15 0435 02/25/15 1627 02/26/15 0013 02/26/15 0452 02/26/15 0734  GLUCAP 105* 111* 93 120* 87 100*    Imaging Dg Chest Port 1 View  02/25/2015   CLINICAL DATA:  Hypoxia  EXAM: PORTABLE CHEST - 1 VIEW  COMPARISON:  02/23/2015  FINDINGS: A single AP portable view of the chest demonstrates near complete clearance of edema with only minimal residual opacity in the left base. There is no large effusion or pneumothorax. Cardiac and  mediastinal contours appear unremarkable.  IMPRESSION: Significantly improved, with nearly complete clearance of edema.   Electronically Signed   By: Andreas Newport M.D.   On: 02/25/2015 05:26     ASSESSMENT / PLAN:  Acute encephalopathy 2nd to ETOH withdrawal with DT's. Hx of TBI, seizure disorder. Acute hypoxic respiratory failure >> Presumed from pulmonary edema. Possible PE/ / RLE DVT Possibly LUE DVT HTN  Staph bacteremia.  Discussion  He look a little better. CT was neg for PE.   Plan:   Continue lasix Oxygen to keep SpO2 > 92% F/u CXR intermittently F/u CBC Therapeutic LMWH w/ TEDs hose.  Will need to decide on oral rx when encephalopathy resolves. Doubt good coumadin  candidate given etoh abuse. Day 12/14 of Abx, changed to ancef 3/10 RASS goal 0 Cont scheduled seroquel and clonazepam Cont Keppra, Thiamine, folic acid Needs psych consult   Erick Colace ACNP-BC [place] Pager # [phone] OR # [phone] if no answer  Patient appears more comfortable.  No need for precedex, continue abx, continue seroquel and clonazepam, needs psych to see.  PCCM will sign off, please call back if needed.  Patient seen and examined, agree with above note.  I dictated the care and orders written for this patient under my direction.  Rush Farmer, MD [phone]

## 2015-02-27 LAB — GLUCOSE, CAPILLARY
GLUCOSE-CAPILLARY: 111 mg/dL — AB (ref 70–99)
GLUCOSE-CAPILLARY: 101 mg/dL — AB (ref 70–99)
Glucose-Capillary: 104 mg/dL — ABNORMAL HIGH (ref 70–99)
Glucose-Capillary: 128 mg/dL — ABNORMAL HIGH (ref 70–99)
Glucose-Capillary: 171 mg/dL — ABNORMAL HIGH (ref 70–99)
Glucose-Capillary: 95 mg/dL (ref 70–99)

## 2015-02-27 LAB — BASIC METABOLIC PANEL
Anion gap: 4 — ABNORMAL LOW (ref 5–15)
BUN: 15 mg/dL (ref 6–23)
CHLORIDE: 103 mmol/L (ref 96–112)
CO2: 33 mmol/L — ABNORMAL HIGH (ref 19–32)
Calcium: 8.9 mg/dL (ref 8.4–10.5)
Creatinine, Ser: 1.13 mg/dL (ref 0.50–1.35)
GFR calc non Af Amer: 77 mL/min — ABNORMAL LOW (ref >90)
GFR, EST AFRICAN AMERICAN: 90 mL/min — AB (ref >90)
Glucose, Bld: 103 mg/dL — ABNORMAL HIGH (ref 70–99)
POTASSIUM: 4 mmol/L (ref 3.5–5.1)
Sodium: 140 mmol/L (ref 135–145)

## 2015-02-27 LAB — CBC
HEMATOCRIT: 45.1 % (ref 39.0–52.0)
Hemoglobin: 14.7 g/dL (ref 13.0–17.0)
MCH: 31.3 pg (ref 26.0–34.0)
MCHC: 32.6 g/dL (ref 30.0–36.0)
MCV: 96 fL (ref 78.0–100.0)
Platelets: 277 10*3/uL (ref 150–400)
RBC: 4.7 MIL/uL (ref 4.22–5.81)
RDW: 13.6 % (ref 11.5–15.5)
WBC: 6.7 10*3/uL (ref 4.0–10.5)

## 2015-02-27 MED ORDER — METOPROLOL TARTRATE 25 MG PO TABS
25.0000 mg | ORAL_TABLET | Freq: Two times a day (BID) | ORAL | Status: DC
  Administered 2015-02-27 – 2015-03-01 (×3): 25 mg via ORAL
  Filled 2015-02-27 (×5): qty 1

## 2015-02-27 NOTE — Progress Notes (Signed)
[facility] PROGRESS NOTE  Ehren Berisha UMP:[id number] DOB: [birth year] DOA: 02/11/2015 PCP: [name], MD  Assessment/Plan: 1-acute encephalopathy 2/2 to ETOH withdrawal and DT's -patient completed almost 2 weeks of precedex -patient mentation affected due to withdrawal and potential hospital acquired delirium. Cont  seroquel and d/c risperdal, adjust ativan medication/frequency and continue klonopin -psych has been consulted,  assess for competency/capacity.requesed psyche to comment , patient threading to leave AMA; might need IVC)  2-hx of TBI and seizure disorder: continue Keppra  3-acute resp failure with hypoxia: due to Pulmonary edema and presumed PE -continue oxygen supplementation -negative CTA -continue anticoagulation -might need IVC filter if big burden clot found -continue lasix   4-acute RLE DVT, LUE SVT and presumed PE:  -continue anticoagulation, now with lovenox as patient pulling IV and  -check CTA -need to discussed best agent for anticoagulation once encephalopathy improved  5-ETOH abuse/delirium: Patient has actively experienced detox/withdrawal during this admission -continue ativan, klonopin and seroquel now -psych consulted today, will follow any rec's fro ongoing DT's -continue thiamine and folic acid Patient does not meet criteria for psychiatric inpatient admission   6-coagulase neg staph bacteremia: 2/2 blood cx's -treatment is for a total of 14 days (coursing days 12/14) -patient on ancef -no fever  7-hypokalemia and low Mg: repleted -will monitor electrolytes  8-physical deconditioning: PT has been ordered; will follow rec's  9-HTN and Tachycardia:  -continue Clonidine, lopressor and lasix Increase lopressor   Code Status: Full Family Communication: mother at bedside Disposition Plan: remains in stepdown; SNF?   Consultants:  PCCM  Psychiatry  STUDIES:  2/29 CT head >> Lt frontal encephalomalacia 3/01 EEG >> normal  sleep EEG 3/02 Echo >> EF 60 to 40%, grade 1 diastolic dysfx 0/86 EEG >> generalized slowing 3/04 EEG >> generalized slowing 3/04 MRI brain >> Lt frontal encephalomalacia 3/08 Doppler legs >> DVT Rt posterior tibial vein 3/14: CT angio of chest ordered, but pending.  SIGNIFICANT EVENTS: 2/29 Admit, neuro consulted 3/02 Start precedex 3/11 results of RLE DVT +, LMWH started.  3/12 trying to decrease sedation Awaiting [place] LUE.Marland KitchenPossilbe DVT. Requiring High FIO2  3/13: now on [place], tolerated dose reduction of Risperdal and clonopin. Eating regular diet. PT/OT ordered.  3/14: very agitated, confused, verbally abusive and tangential; threading to leave AMA and impairing care. Patient required multiple doses of ativan and also Haldol, before calming down  Culture data Urine 3/01 >> Coag neg staph Blood 3/03 >> 2/2 Coag neg staph Blood 3/07 >> NEG  Antibiotics:  Vancomycin/ancef now for staph coagulase neg bacteremia (plan is to complete 14 days treatment)  HPI/Subjective: Afebrile,  Awake and  Confused but calm. Hypertensive with sbp in the 190's  Objective: Filed Vitals:   02/27/15 1252  BP: 156/94  Pulse: 74  Temp: 97.7 F (36.5 C)  Resp: 13    Intake/Output Summary (Last 24 hours) at 02/27/15 1611 Last data filed at 02/27/15 1245  Gross per 24 hour  Intake     10 ml  Output   1400 ml  Net  -1390 ml   Filed Weights   02/25/15 0409 02/26/15 0401 02/27/15 0500  Weight: 94.348 kg (208 lb) 92.987 kg (205 lb) 93 kg (205 lb 0.4 oz)    Exam:   General:  Somnolent and sleepy this morning; calm and currently able to follow just simple commands; patient confused. On [place] 4L O2 sat 90-91%  Cardiovascular: tachycardic, no rubs or gallops, no murmurs   Respiratory: god air movement, positive rhonchi;  no wheezing  Abdomen: soft, NT, ND, positive BS  Musculoskeletal:left upper extremity is swollen and tender; no Le edema or cyanosis appreciated  Data Reviewed: Basic Metabolic  Panel:  Recent Labs Lab 02/21/15 0657 02/22/15 0204 02/23/15 0343 02/25/15 0423 02/27/15 0615  NA 139 138 140 139 140  K 3.5 3.8 3.6 3.9 4.0  CL 107 100 101 99 103  CO2 28 32 32 31 33*  GLUCOSE 123* 120* 143* 111* 103*  BUN 15 11 14 15 15   CREATININE 0.95 0.96 0.89 1.11 1.13  CALCIUM 8.6 8.8 9.0 9.0 8.9  MG 1.8 1.9  --   --   --   PHOS  --  3.4  --   --   --    Liver Function Tests:  Recent Labs Lab 02/21/15 0657 02/22/15 0204  AST 20 20  ALT 21 21  ALKPHOS 65 73  BILITOT 0.6 0.5  PROT 6.2 6.8  ALBUMIN 2.6* 2.9*   CBC:  Recent Labs Lab 02/21/15 0657 02/22/15 0204 02/24/15 0913 02/27/15 0615  WBC 9.3 8.7 8.0 6.7  HGB 16.3 16.7 17.1* 14.7  HCT 47.8 49.1 51.0 45.1  MCV 94.7 93.0 96.6 96.0  PLT 291 294 336 277   ProBNP (last 3 results)  Recent Labs  06/20/14 0558 08/15/14 1244  PROBNP 13.5 36.0    CBG:  Recent Labs Lab 02/26/15 2035 02/27/15 0013 02/27/15 0415 02/27/15 0746 02/27/15 1251  GLUCAP 111* 171* 111* 101* 104*    Recent Results (from the past 240 hour(s))  Culture, blood (routine x 2)     Status: None   Collection Time: 02/18/15 12:05 PM  Result Value Ref Range Status   Specimen Description BLOOD RIGHT HAND FINGER  Final   Special Requests BOTTLES DRAWN AEROBIC ONLY 1CC  Final   Culture   Final    NO GROWTH 5 DAYS Performed at [facility]    Report Status 02/24/2015 FINAL  Final  Culture, blood (routine x 2)     Status: None   Collection Time: 02/18/15 12:10 PM  Result Value Ref Range Status   Specimen Description BLOOD RIGHT HAND  Final   Special Requests BOTTLES DRAWN AEROBIC ONLY 1CC  Final   Culture   Final    NO GROWTH 5 DAYS Performed at [facility]    Report Status 02/24/2015 FINAL  Final     Studies: Ct Angio Chest Pe W/cm &/or Wo Cm  02/26/2015   CLINICAL DATA:  Upper and lower extremity deep venous thrombosis.  EXAM: CT ANGIOGRAPHY CHEST WITH CONTRAST  TECHNIQUE: Multidetector CT imaging of  the chest was performed using the standard protocol during bolus administration of intravenous contrast. Multiplanar CT image reconstructions and MIPs were obtained to evaluate the vascular anatomy.  CONTRAST:  173mL OMNIPAQUE IOHEXOL 350 MG/ML SOLN  COMPARISON:  Chest CT December 01, 2014; chest radiograph February 25, 2015  FINDINGS: There is no demonstrable pulmonary embolus. There is no thoracic aortic aneurysm or dissection.  There are small pleural effusions bilaterally with mild bibasilar consolidation posteriorly. There is patchy infiltrate in each lower lobe, primarily in the superior segments. Lungs elsewhere are clear.  There are multiple small mediastinal lymph nodes which do not meet size criteria for pathologic significance. These lymph nodes appear stable compared to prior study. No frank adenopathy is seen by size criteria. Pericardium is not thickened. There is left ventricular hypertrophy. Visualized thyroid appears normal.  In the visualized upper abdomen, no lesions are seen.  There are no blastic or lytic bone lesions.  Review of the MIP images confirms the above findings.  IMPRESSION: No demonstrable pulmonary embolus. Small bilateral pleural effusions with patchy lower lobe infiltrate bilaterally. Multiple small lymph nodes appear stable. No frank adenopathy. There is left ventricular hypertrophy.   Electronically Signed   By: Lowella Grip III M.D.   On: 02/26/2015 10:21    Scheduled Meds: . antiseptic oral rinse  7 mL Mouth Rinse q12n4p  .  ceFAZolin (ANCEF) IV  2 g Intravenous 3 times per day  . chlorhexidine  15 mL Mouth Rinse BID  . clonazePAM  0.5 mg Oral BID  . cloNIDine  0.2 mg Oral TID  . enoxaparin (LOVENOX) injection  95 mg Subcutaneous BID  . folic acid  1 mg Oral Daily  . furosemide  40 mg Oral Daily  . levETIRAcetam  500 mg Oral BID  . metoprolol tartrate  12.5 mg Oral BID  . nicotine  21 mg Transdermal Daily  . potassium chloride  40 mEq Oral Daily  . QUEtiapine   50 mg Oral BID  . sodium chloride  10-40 mL Intracatheter Q12H  . sodium chloride  3 mL Intravenous Q12H  . thiamine  100 mg Oral Daily   Continuous Infusions: . sodium chloride 10 mL/hr (02/24/15 2200)    Principal Problem:   Alcohol withdrawal delirium Active Problems:   Encephalopathy   Alcohol intoxication   Lactic acidosis   Altered mental status   Metabolic acidosis   Essential hypertension   Acute respiratory failure with hypoxia   Acute respiratory failure with hypoxemia   Bacteremia due to Staphylococcus   Protein-calorie malnutrition, severe   Seizures   Acute pulmonary edema   Lower leg DVT (deep venous thromboembolism), acute    Time spent: 35 minutes (> 50% of time dedicated with face to face evaluation, care coordination and discussion of case with consultants)    [facility]  [facility] Pager [phone]. If 7PM-7AM, please contact night-coverage at [email or web address], password [facility] 02/27/2015, 4:11 PM  LOS: 16 days

## 2015-02-27 NOTE — Clinical Social Work Psychosocial (Signed)
Clinical Social Work Department BRIEF PSYCHOSOCIAL ASSESSMENT 02/27/2015  Patient:  ELO, MARMOLEJOS     Account Number:  [id number]     Admit date:  08/29/2004  Clinical Social Worker:  Domenica Reamer, CLINICAL SOCIAL WORKER  Date/Time:  02/27/2015 12:11 PM  Referred by:  Physician  Date Referred:  02/26/2015 Referred for  SNF Placement   Other Referral:   Interview type:  Family Other interview type:    PSYCHOSOCIAL DATA Living Status:  WIFE Admitted from facility:   Level of care:   Primary support name:  Kathyrn Drown Primary support relationship to patient:  PARENT Degree of support available:   high level of support from patients mom who has been at bedside during this admission    CURRENT CONCERNS Current Concerns  Post-Acute Placement   Other Concerns:    SOCIAL WORK ASSESSMENT / PLAN CSW spoke with pt mom at bedside concerning patients disposition at discharge- pt mom is agreeable to SNF but is unsure how pt will feel about this- pt is currentlyin withdrawals and was unresponsive during the interview- pt has been verbally abusive/inappropriate during his stay.    Pt mom would like a facility close to their home (specifically mentioned [facility])- reports that there are transportation restrictions that would prohibit them from visiting pt if he was far away.  CSW informed pt mom about SNF process and warned her that with Medicaid as the primary payor we would need to search mor than [facility] for placement.   Assessment/plan status:  Psychosocial Support/Ongoing Assessment of Needs Other assessment/ plan:   FL2   Information/referral to community resources:    PATIENT'S/FAMILY'S RESPONSE TO PLAN OF CARE: Patient family is agreeable to plan for SNF and just wants what is best for the patient to help him recover physically/medically.       Domenica Reamer, [place] Social Worker [phone]

## 2015-02-27 NOTE — Progress Notes (Signed)
ANTIBIOTIC/ANTICOAGULATION CONSULT NOTE   Pharmacy Consult:  Ancef and lovenox Indication:  CoNS bacteremia and DVT  Allergies  Allergen Reactions  . Dilaudid [Hydromorphone Hcl] Other (See Comments)    Bradycardia and Nausea    Patient Measurements: Height: 6' (182.9 cm) Weight: 205 lb 0.4 oz (93 kg) IBW/kg (Calculated) : 77.6  Vital Signs: Temp: 97.7 F (36.5 C) (03/16 1252) Temp Source: Oral (03/16 1252) BP: 156/94 mmHg (03/16 1252) Pulse Rate: 74 (03/16 1252) Intake/Output from previous day: 03/15 0701 - 03/16 0700 In: 20 [I.V.:20] Out: 950 [Urine:950] Intake/Output from this shift:    Labs:  Recent Labs  02/25/15 0423 02/27/15 0615  WBC  --  6.7  HGB  --  14.7  PLT  --  277  CREATININE 1.11 1.13   Estimated Creatinine Clearance: 91.6 mL/min (by C-G formula based on Cr of 1.13). No results for input(s): VANCOTROUGH, VANCOPEAK, VANCORANDOM, GENTTROUGH, GENTPEAK, GENTRANDOM, TOBRATROUGH, TOBRAPEAK, TOBRARND, AMIKACINPEAK, AMIKACINTROU, AMIKACIN in the last 72 hours.   Microbiology: Recent Results (from the past 720 hour(s))  MRSA PCR Screening     Status: None   Collection Time: 02/12/15  1:25 AM  Result Value Ref Range Status   MRSA by PCR NEGATIVE NEGATIVE Final    Comment:        The GeneXpert MRSA Assay (FDA approved for NASAL specimens only), is one component of a comprehensive MRSA colonization surveillance program. It is not intended to diagnose MRSA infection nor to guide or monitor treatment for MRSA infections.   Urine culture     Status: None   Collection Time: 02/12/15  6:16 AM  Result Value Ref Range Status   Specimen Description URINE, RANDOM  Final   Special Requests NONE  Final   Colony Count   Final    75,000 COLONIES/ML Performed at [facility]    Culture   Final    STAPHYLOCOCCUS SPECIES (COAGULASE NEGATIVE) Note: RIFAMPIN AND GENTAMICIN SHOULD NOT BE USED AS SINGLE DRUGS FOR TREATMENT OF STAPH INFECTIONS. Performed  at [facility]    Report Status 02/15/2015 FINAL  Final   Organism ID, Bacteria STAPHYLOCOCCUS SPECIES (COAGULASE NEGATIVE)  Final      Susceptibility   Staphylococcus species (coagulase negative) - MIC*    GENTAMICIN <=0.5 SENSITIVE Sensitive     LEVOFLOXACIN <=0.12 SENSITIVE Sensitive     NITROFURANTOIN <=16 SENSITIVE Sensitive     OXACILLIN >=4 RESISTANT Resistant     PENICILLIN RESISTANT      RIFAMPIN <=0.5 SENSITIVE Sensitive     TRIMETH/SULFA <=10 SENSITIVE Sensitive     VANCOMYCIN 1 SENSITIVE Sensitive     TETRACYCLINE <=1 SENSITIVE Sensitive     * STAPHYLOCOCCUS SPECIES (COAGULASE NEGATIVE)  Culture, blood (routine x 2)     Status: None   Collection Time: 02/14/15 10:13 PM  Result Value Ref Range Status   Specimen Description BLOOD LEFT ARM  Final   Special Requests BOTTLES DRAWN AEROBIC ONLY 2CC  Final   Culture   Final    STAPHYLOCOCCUS SPECIES (COAGULASE NEGATIVE) Note: RIFAMPIN AND GENTAMICIN SHOULD NOT BE USED AS SINGLE DRUGS FOR TREATMENT OF STAPH INFECTIONS. This organism DOES NOT demonstrate inducible Clindamycin resistance in vitro. Note: Gram Stain Report Called to,Read Back By and Verified With: [facility] STOWE 02/16/15 AT 0240 RIDK Performed at [facility]    Report Status 02/18/2015 FINAL  Final   Organism ID, Bacteria STAPHYLOCOCCUS SPECIES (COAGULASE NEGATIVE)  Final      Susceptibility   Staphylococcus  species (coagulase negative) - MIC*    CLINDAMYCIN <=0.25 SENSITIVE Sensitive     ERYTHROMYCIN >=8 RESISTANT Resistant     GENTAMICIN <=0.5 SENSITIVE Sensitive     LEVOFLOXACIN <=0.12 SENSITIVE Sensitive     OXACILLIN <=0.25 SENSITIVE Sensitive     PENICILLIN <=0.03 SENSITIVE Sensitive     RIFAMPIN <=0.5 SENSITIVE Sensitive     TRIMETH/SULFA <=10 SENSITIVE Sensitive     VANCOMYCIN 1 SENSITIVE Sensitive     TETRACYCLINE <=1 SENSITIVE Sensitive     * STAPHYLOCOCCUS SPECIES (COAGULASE NEGATIVE)  Culture, blood (routine x 2)     Status: None    Collection Time: 02/14/15 10:24 PM  Result Value Ref Range Status   Specimen Description BLOOD LEFT HAND  Final   Special Requests BOTTLES DRAWN AEROBIC ONLY 2CC  Final   Culture   Final    STAPHYLOCOCCUS SPECIES (COAGULASE NEGATIVE) Note: SUSCEPTIBILITIES PERFORMED ON PREVIOUS CULTURE WITHIN THE LAST 5 DAYS. Note: Gram Stain Report Called to,Read Back By and Verified With: [facility] STOWE 02/16/15 AT 0445 RIDK Performed at [facility]    Report Status 02/18/2015 FINAL  Final  Culture, blood (routine x 2)     Status: None   Collection Time: 02/18/15 12:05 PM  Result Value Ref Range Status   Specimen Description BLOOD RIGHT HAND FINGER  Final   Special Requests BOTTLES DRAWN AEROBIC ONLY 1CC  Final   Culture   Final    NO GROWTH 5 DAYS Performed at [facility]    Report Status 02/24/2015 FINAL  Final  Culture, blood (routine x 2)     Status: None   Collection Time: 02/18/15 12:10 PM  Result Value Ref Range Status   Specimen Description BLOOD RIGHT HAND  Final   Special Requests BOTTLES DRAWN AEROBIC ONLY [place]  Final   Culture   Final    NO GROWTH 5 DAYS Performed at [facility]    Report Status 02/24/2015 FINAL  Final    Assessment: 44 y.o. male presented 2/29 with AMS/alcohol intoxication  Anticoagulation: Lovenox for new right DVT(02/19/15). Initially presumed PE but CT neg for PE. no bleeding, dose appropriate, wt = 93 kg. CBC stable. Plan to transition to po AC once encephalopathy improves.  Infectious Disease: Antibitoics Day #12/14 - Ancef Day #7 CoNS bacteremia. Afebrile, WBC WNL.   Vanc 3/5 >> 3/10 Ancef 3/10 >>should stop 3/18  Goal of Therapy:  Resolution of infection Anti-Xa = 0.6-1  Plan:  - Ancef 2gm IV Q8H - should end 3/18 (14 days total treatment) - Lovenox 95mg  SQ Q12H - CBC Q72H while on Lovenox - F/U PO AC when possible  Sherlon Handing, PharmD, BCPS Clinical pharmacist, pager [phone] 02/27/2015 1:01 PM

## 2015-02-28 DIAGNOSIS — Y906 Blood alcohol level of 120-199 mg/100 ml: Secondary | ICD-10-CM

## 2015-02-28 LAB — COMPREHENSIVE METABOLIC PANEL
ALK PHOS: 58 U/L (ref 39–117)
ALT: 18 U/L (ref 0–53)
ANION GAP: 10 (ref 5–15)
AST: 35 U/L (ref 0–37)
Albumin: 3 g/dL — ABNORMAL LOW (ref 3.5–5.2)
BUN: 18 mg/dL (ref 6–23)
CO2: 25 mmol/L (ref 19–32)
Calcium: 8.9 mg/dL (ref 8.4–10.5)
Chloride: 105 mmol/L (ref 96–112)
Creatinine, Ser: 1.21 mg/dL (ref 0.50–1.35)
GFR calc Af Amer: 83 mL/min — ABNORMAL LOW (ref >90)
GFR, EST NON AFRICAN AMERICAN: 71 mL/min — AB (ref >90)
Glucose, Bld: 98 mg/dL (ref 70–99)
Potassium: 4.5 mmol/L (ref 3.5–5.1)
SODIUM: 140 mmol/L (ref 135–145)
Total Bilirubin: 0.6 mg/dL (ref 0.3–1.2)
Total Protein: 6.8 g/dL (ref 6.0–8.3)

## 2015-02-28 LAB — CBC
HEMATOCRIT: 46.1 % (ref 39.0–52.0)
HEMOGLOBIN: 15 g/dL (ref 13.0–17.0)
MCH: 31.1 pg (ref 26.0–34.0)
MCHC: 32.5 g/dL (ref 30.0–36.0)
MCV: 95.4 fL (ref 78.0–100.0)
PLATELETS: 293 10*3/uL (ref 150–400)
RBC: 4.83 MIL/uL (ref 4.22–5.81)
RDW: 13.5 % (ref 11.5–15.5)
WBC: 7.3 10*3/uL (ref 4.0–10.5)

## 2015-02-28 LAB — GLUCOSE, CAPILLARY
GLUCOSE-CAPILLARY: 95 mg/dL (ref 70–99)
GLUCOSE-CAPILLARY: 94 mg/dL (ref 70–99)
Glucose-Capillary: 114 mg/dL — ABNORMAL HIGH (ref 70–99)

## 2015-02-28 MED ORDER — HALOPERIDOL LACTATE 5 MG/ML IJ SOLN
2.0000 mg | Freq: Four times a day (QID) | INTRAMUSCULAR | Status: DC | PRN
  Administered 2015-02-28 – 2015-03-01 (×3): 2 mg via INTRAVENOUS
  Filled 2015-02-28 (×2): qty 1

## 2015-02-28 MED ORDER — HALOPERIDOL LACTATE 5 MG/ML IJ SOLN
INTRAMUSCULAR | Status: AC
  Filled 2015-02-28: qty 1

## 2015-02-28 MED ORDER — ENSURE COMPLETE PO LIQD
237.0000 mL | Freq: Two times a day (BID) | ORAL | Status: DC
  Administered 2015-02-28 – 2015-03-01 (×2): 237 mL via ORAL

## 2015-02-28 MED ORDER — HALOPERIDOL LACTATE 5 MG/ML IJ SOLN
1.0000 mg | Freq: Four times a day (QID) | INTRAMUSCULAR | Status: DC | PRN
  Administered 2015-02-28: 1 mg via INTRAVENOUS
  Filled 2015-02-28: qty 1

## 2015-02-28 NOTE — Progress Notes (Signed)
PT Cancellation Note  Patient Details Name: Richard [name] MRN: [id number] DOB: 04-May-1971   Cancelled Treatment:    Reason Eval/Treat Not Completed: Medical issues which prohibited therapy. Pt highly agitated this AM. Security is currently in his room for the 2nd time this morning attempting to calm him. Not appropriate for PT Rx at this time.   Lorriane Shire 02/28/2015, 10:13 AM

## 2015-02-28 NOTE — Clinical Social Work Note (Signed)
Patient has bed available at [name]-family is not sure if they will accept bed offer due to distance- still unsure if pt is competent at this time and if he will refuse SNF  -facility is unable to admit until restraints are off for at least 24 hours (includes sitter).  CSW will continue to follow.  Domenica Reamer, [place] Social Worker [phone]
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
## 2015-02-28 NOTE — Progress Notes (Addendum)
Shift Event: Pt very combative despite IV ativan. Haldol 1mg  ordered.  [place] [facility]

## 2015-02-28 NOTE — Progress Notes (Signed)
NUTRITION FOLLOW UP  DOCUMENTATION CODES Per approved criteria  -Severe malnutrition in the context of acute illness or injury        Intervention:    Pt needs bowel regimen  Ensure Enlive po BID, each supplement provides 350 kcal and 20 grams of protein  Magic cup TID with meals, each supplement provides 290 kcal and 9 grams of protein   Nutrition Dx:   Malnutrition related to acute illness as evidenced by 7% weight loss x 8 days and intake of </= 50% of their needs for >/= 5 days. (NPO x 8 days); ongoing  Goal:   Pt to meet >/= 90% of their estimated nutrition needs; not met.  Monitor:   TF tolerance/adequacy, labs  Assessment:   Pt with hx of ETOH and polysubstance abuse admitted with encephalopathy and alcohol intoxication. Suspect poor diet PTA.  Pt transferred to ICU 3/2.   NG placed to start TF 3/8.   TF d/c'ed 3/12 with advancement of diet to Regular 3/12. Pt agitated and combative this am.  Intake has not been consistently documented, will add supplements to better meet needs.   Height: Ht Readings from Last 1 Encounters:  02/11/15 6' (1.829 m)    Weight Status:   Wt Readings from Last 1 Encounters:  02/28/15 207 lb 3.7 oz (94 kg)  Admission weight 225 lb (102 kg) 2/29  Re-estimated needs:  Kcal: 2300-2500 Protein: 115-130 grams Fluid: > 2.3 L/day  Skin: Stage II on top of ear  Diet Order: Diet regular Meal Completion: 45% of dinner 3/14  Intake/Output Summary (Last 24 hours) at 02/28/15 1037 Last data filed at 02/27/15 1245  Gross per 24 hour  Intake      0 ml  Output    450 ml  Net   -450 ml    Last BM: 3/12   Labs:   Recent Labs Lab 02/22/15 0204  02/25/15 0423 02/27/15 0615 02/28/15 0604  NA 138  < > 139 140 140  K 3.8  < > 3.9 4.0 4.5  CL 100  < > 99 103 105  CO2 32  < > 31 33* 25  BUN 11  < > 15 15 18   CREATININE 0.96  < > 1.11 1.13 1.21  CALCIUM 8.8  < > 9.0 8.9 8.9  MG 1.9  --   --   --   --   PHOS 3.4  --   --    --   --   GLUCOSE 120*  < > 111* 103* 98  < > = values in this interval not displayed.  CBG (last 3)   Recent Labs  02/27/15 2017 02/27/15 2357 02/28/15 0813  GLUCAP 95 114* 95    Scheduled Meds: . antiseptic oral rinse  7 mL Mouth Rinse q12n4p  .  ceFAZolin (ANCEF) IV  2 g Intravenous 3 times per day  . chlorhexidine  15 mL Mouth Rinse BID  . clonazePAM  0.5 mg Oral BID  . cloNIDine  0.2 mg Oral TID  . enoxaparin (LOVENOX) injection  95 mg Subcutaneous BID  . folic acid  1 mg Oral Daily  . furosemide  40 mg Oral Daily  . levETIRAcetam  500 mg Oral BID  . metoprolol tartrate  25 mg Oral BID  . nicotine  21 mg Transdermal Daily  . potassium chloride  40 mEq Oral Daily  . QUEtiapine  50 mg Oral BID  . sodium chloride  10-40 mL Intracatheter Q12H  .  sodium chloride  3 mL Intravenous Q12H  . thiamine  100 mg Oral Daily    Continuous Infusions: . sodium chloride 10 mL/hr (02/24/15 2200)    [place], [place], CNSC [phone] Pager [phone] After Hours Pager
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
## 2015-02-28 NOTE — Clinical Social Work Placement (Signed)
Clinical Social Work Department CLINICAL SOCIAL WORK PLACEMENT NOTE 02/28/2015  Patient:  Richard [name], Richard [name]  Account Number:  [id number] [place] date:  02/11/2015  Clinical Social Worker:  Domenica Reamer, CLINICAL SOCIAL WORKER  Date/time:  02/27/2015 01:17 PM  Clinical Social Work is seeking post-discharge placement for this patient at the following level of care:   [place]   (*CSW will update this form in Epic as items are completed)   02/27/2015  Patient/family provided with [place] Department of Clinical Social Work's list of facilities offering this level of care within the geographic area requested by the patient (or if unable, by the patient's family).  02/27/2015  Patient/family informed of their freedom to choose among providers that offer the needed level of care, that participate in Medicare, Medicaid or managed care program needed by the patient, have an available bed and are willing to accept the patient.  02/27/2015  Patient/family informed of [facility]' ownership interest in [facility], as well as of the fact that they are under no obligation to receive care at this facility.  PASARR submitted to [facility] on 02/27/2015 PASARR number received on 02/27/2015  FL2 transmitted to all facilities in geographic area requested by pt/family on  02/27/2015 FL2 transmitted to all facilities within larger geographic area on   Patient informed that his/her managed care company has contracts with or will negotiate with  certain facilities, including the following:     Patient/family informed of bed offers received:   Patient chooses bed at  Physician recommends and patient chooses bed at    Patient to be transferred to  on   Patient to be transferred to facility by  Patient and family notified of transfer on  Name of family member notified:    The following physician request were entered in Epic:   Additional Comments: Domenica Reamer, [place] Social  Worker [phone]

## 2015-02-28 NOTE — Progress Notes (Addendum)
[facility] PROGRESS NOTE  Kazimir Hartnett TKZ:[id number] DOB: 1971/11/24 DOA: 02/11/2015 PCP: [name], MD  Assessment/Plan: 1-acute encephalopathy 2/2 to ETOH withdrawal and DT's -patient completed almost 2 weeks of precedex -patient mentation affected due to withdrawal and potential hospital acquired delirium. Cont  seroquel and d/c risperdal, adjust ativan medication/frequency and continue klonopin I have personally called psychiatry twice to discuss if the patient has capacity According to Dr.Jonnalagadda patient does have capacity Currently in restraints because of combativeness, not sure if the patient is clearly ready for discharge Would not be a candidate for IVC: Since psychiatry feels that the patient has capacity I would continue efforts to calm him down and discharge him when he is clinically  more stable He would not be a candidate to discharge to SNF , as if he  is in 4 point restraints or has a sitter  I anticipate that if patient desires he will go home in am    2-hx of TBI and seizure disorder: continue Keppra  3-acute resp failure with hypoxia: due to Pulmonary edema and presumed PE -continue oxygen supplementation -negative CTA -continue Lovenox, may switch to xarelto plan to discharge  Continue Lasix   4-acute RLE DVT, LUE SVT and presumed PE:  -continue anticoagulation,  with lovenox  Negative  CTA switch to xarelto   5-ETOH abuse/delirium: Patient has actively experienced detox/withdrawal during this admission -continue ativan, klonopin and seroquel now -psych following, continue when necessary Ativan, Klonopin, Seroquel -continue thiamine and folic acid Patient does not meet criteria for psychiatric inpatient admission   6-coagulase neg staph bacteremia: 2/2 blood cx's -treatment is for a total of 14 days (coursing days 12/14) -patient on ancef -no fever  7-hypokalemia and low Mg: repleted -will monitor electrolytes  8-physical  deconditioning: PT has been ordered; will follow rec's  9-HTN and Tachycardia:  -continue Clonidine, lopressor and lasix   metoprolol increased to 25 mg by mouth twice a day  Code Status: Full Family Communication: mother at bedside Disposition Plan: remains in stepdown; SNF vs home with mother in am    Consultants:  PCCM  Psychiatry  STUDIES:  2/29 CT head >> Lt frontal encephalomalacia 3/01 EEG >> normal sleep EEG 3/02 Echo >> EF 60 to 57%, grade 1 diastolic dysfx 3/22 EEG >> generalized slowing 3/04 EEG >> generalized slowing 3/04 MRI brain >> Lt frontal encephalomalacia 3/08 Doppler legs >> DVT Rt posterior tibial vein 3/14: CT angio of chest ordered, but pending.  SIGNIFICANT EVENTS: 2/29 Admit, neuro consulted 3/02 Start precedex 3/11 results of RLE DVT +, LMWH started.  3/12 trying to decrease sedation Awaiting [place] LUE.Marland KitchenPossilbe DVT. Requiring High FIO2  3/13: now on [place], tolerated dose reduction of Risperdal and clonopin. Eating regular diet. PT/OT ordered.  3/14: very agitated, confused, verbally abusive and tangential; threading to leave AMA and impairing care. Patient required multiple doses of ativan and also Haldol, before calming down  Culture data Urine 3/01 >> Coag neg staph Blood 3/03 >> 2/2 Coag neg staph Blood 3/07 >> NEG  Antibiotics:  Vancomycin/ancef now for staph coagulase neg bacteremia (plan is to complete 14 days treatment)  HPI/Subjective: Afebrile,  Awake and  Confused but calm. Hypertensive with sbp in the 190's  Objective: Filed Vitals:   02/28/15 1205  BP:   Pulse: 76  Temp:   Resp: 20    Intake/Output Summary (Last 24 hours) at 02/28/15 1413 Last data filed at 02/28/15 1353  Gross per 24 hour  Intake    710  ml  Output   1250 ml  Net   -540 ml   Filed Weights   02/26/15 0401 02/27/15 0500 02/28/15 0500  Weight: 92.987 kg (205 lb) 93 kg (205 lb 0.4 oz) 94 kg (207 lb 3.7 oz)    Exam:   General:  Somnolent and sleepy  this morning; calm and currently able to follow just simple commands; patient confused. On  4L O2 sat 90-91%  Cardiovascular: tachycardic, no rubs or gallops, no murmurs   Respiratory: god air movement, positive rhonchi; no wheezing  Abdomen: soft, NT, ND, positive BS  Musculoskeletal:left upper extremity is swollen and tender; no Le edema or cyanosis appreciated  Data Reviewed: Basic Metabolic Panel:  Recent Labs Lab 02/22/15 0204 02/23/15 0343 02/25/15 0423 02/27/15 0615 02/28/15 0604  NA 138 140 139 140 140  K 3.8 3.6 3.9 4.0 4.5  CL 100 101 99 103 105  CO2 32 32 31 33* 25  GLUCOSE 120* 143* 111* 103* 98  BUN 11 14 15 15 18   CREATININE 0.96 0.89 1.11 1.13 1.21  CALCIUM 8.8 9.0 9.0 8.9 8.9  MG 1.9  --   --   --   --   PHOS 3.4  --   --   --   --    Liver Function Tests:  Recent Labs Lab 02/22/15 0204 02/28/15 0604  AST 20 35  ALT 21 18  ALKPHOS 73 58  BILITOT 0.5 0.6  PROT 6.8 6.8  ALBUMIN 2.9* 3.0*   CBC:  Recent Labs Lab 02/22/15 0204 02/24/15 0913 02/27/15 0615 02/28/15 0604  WBC 8.7 8.0 6.7 7.3  HGB 16.7 17.1* 14.7 15.0  HCT 49.1 51.0 45.1 46.1  MCV 93.0 96.6 96.0 95.4  PLT 294 336 277 293   ProBNP (last 3 results)  Recent Labs  06/20/14 0558 08/15/14 1244  PROBNP 13.5 36.0    CBG:  Recent Labs Lab 02/27/15 1251 02/27/15 1640 02/27/15 2017 02/27/15 2357 02/28/15 0813  GLUCAP 104* 128* 95 114* 95    No results found for this or any previous visit (from the past 240 hour(s)).   Studies: No results found.  Scheduled Meds: . antiseptic oral rinse  7 mL Mouth Rinse q12n4p  .  ceFAZolin (ANCEF) IV  2 g Intravenous 3 times per day  . chlorhexidine  15 mL Mouth Rinse BID  . clonazePAM  0.5 mg Oral BID  . cloNIDine  0.2 mg Oral TID  . enoxaparin (LOVENOX) injection  95 mg Subcutaneous BID  . feeding supplement (ENSURE COMPLETE)  237 mL Oral BID BM  . folic acid  1 mg Oral Daily  . furosemide  40 mg Oral Daily  .  levETIRAcetam  500 mg Oral BID  . metoprolol tartrate  25 mg Oral BID  . nicotine  21 mg Transdermal Daily  . potassium chloride  40 mEq Oral Daily  . QUEtiapine  50 mg Oral BID  . sodium chloride  10-40 mL Intracatheter Q12H  . sodium chloride  3 mL Intravenous Q12H  . thiamine  100 mg Oral Daily   Continuous Infusions: . sodium chloride 10 mL/hr at 02/28/15 0700    Principal Problem:   Alcohol withdrawal delirium Active Problems:   Encephalopathy   Alcohol intoxication   Lactic acidosis   Altered mental status   Metabolic acidosis   Essential hypertension   Acute respiratory failure with hypoxia   Acute respiratory failure with hypoxemia   Bacteremia due to Staphylococcus   Protein-calorie malnutrition,  severe   Seizures   Acute pulmonary edema   Lower leg DVT (deep venous thromboembolism), acute    Time spent: 35 minutes (> 50% of time dedicated with face to face evaluation, care coordination and discussion of case with consultants)    [facility] Pager [phone]. If 7PM-7AM, please contact night-coverage at [email or web address], password [facility] 02/28/2015, 2:13 PM  LOS: 17 days

## 2015-03-01 LAB — GLUCOSE, CAPILLARY
GLUCOSE-CAPILLARY: 151 mg/dL — AB (ref 70–99)
Glucose-Capillary: 114 mg/dL — ABNORMAL HIGH (ref 70–99)
Glucose-Capillary: 119 mg/dL — ABNORMAL HIGH (ref 70–99)

## 2015-03-01 MED ORDER — THIAMINE HCL 100 MG PO TABS: 100.0000 mg | ORAL_TABLET | Freq: Every day | ORAL | Status: DC

## 2015-03-01 MED ORDER — FOLIC ACID 1 MG PO TABS: 1.0000 mg | ORAL_TABLET | Freq: Every day | ORAL | Status: DC

## 2015-03-01 MED ORDER — NICOTINE 21 MG/24HR TD PT24: 21.0000 mg | MEDICATED_PATCH | Freq: Every day | TRANSDERMAL | Status: DC

## 2015-03-01 MED ORDER — RIVAROXABAN 15 MG PO TABS
15.0000 mg | ORAL_TABLET | Freq: Two times a day (BID) | ORAL | Status: DC
  Administered 2015-03-01: 15 mg via ORAL
  Filled 2015-03-01 (×3): qty 1

## 2015-03-01 MED ORDER — RIVAROXABAN 15 MG PO TABS: 15.0000 mg | ORAL_TABLET | Freq: Two times a day (BID) | ORAL | Status: DC

## 2015-03-01 MED ORDER — LEVETIRACETAM 500 MG PO TABS: 500.0000 mg | ORAL_TABLET | Freq: Two times a day (BID) | ORAL | Status: DC

## 2015-03-01 MED ORDER — QUETIAPINE FUMARATE 50 MG PO TABS: 50.0000 mg | ORAL_TABLET | Freq: Two times a day (BID) | ORAL | Status: DC

## 2015-03-01 MED ORDER — FUROSEMIDE 40 MG PO TABS: 40.0000 mg | ORAL_TABLET | Freq: Every day | ORAL | Status: DC

## 2015-03-01 MED ORDER — RIVAROXABAN 20 MG PO TABS: 20.0000 mg | ORAL_TABLET | Freq: Every day | ORAL | Status: DC

## 2015-03-01 MED ORDER — METOPROLOL TARTRATE 25 MG PO TABS: 25.0000 mg | ORAL_TABLET | Freq: Two times a day (BID) | ORAL | Status: DC

## 2015-03-01 MED ORDER — CLONAZEPAM 0.5 MG PO TABS: 0.5000 mg | ORAL_TABLET | Freq: Two times a day (BID) | ORAL | Status: DC

## 2015-03-01 NOTE — Progress Notes (Signed)
Ambulated in the hallway with staff.  Patient's gait is unsteady.

## 2015-03-01 NOTE — Progress Notes (Signed)
ANTIBIOTIC/ANTICOAGULATION CONSULT NOTE   Pharmacy Consult:  Ancef and lovenox Indication:  CoNS bacteremia and DVT  Allergies  Allergen Reactions  . Dilaudid [Hydromorphone Hcl] Other (See Comments)    Bradycardia and Nausea    Patient Measurements: Height: 6' (182.9 cm) Weight: 210 lb (95.255 kg) IBW/kg (Calculated) : 77.6  Vital Signs: Temp: 97.9 F (36.6 C) (03/18 1240) Temp Source: Oral (03/18 1240) BP: 128/66 mmHg (03/18 1240) Pulse Rate: 82 (03/18 1000) Intake/Output from previous day: 03/17 0701 - 03/18 0700 In: 260 [I.V.:210; IV Piggyback:50] Out: 1250 [Urine:1250] Intake/Output from this shift: Total I/O In: 720 [P.O.:717; I.V.:3] Out: 250 [Urine:250]  Labs:  Recent Labs  02/27/15 0615 02/28/15 0604  WBC 6.7 7.3  HGB 14.7 15.0  PLT 277 293  CREATININE 1.13 1.21   Estimated Creatinine Clearance: 93.3 mL/min (by C-G formula based on Cr of 1.21). No results for input(s): VANCOTROUGH, VANCOPEAK, VANCORANDOM, GENTTROUGH, GENTPEAK, GENTRANDOM, TOBRATROUGH, TOBRAPEAK, TOBRARND, AMIKACINPEAK, AMIKACINTROU, AMIKACIN in the last 72 hours.   Microbiology: Recent Results (from the past 720 hour(s))  MRSA PCR Screening     Status: None   Collection Time: 02/12/15  1:25 AM  Result Value Ref Range Status   MRSA by PCR NEGATIVE NEGATIVE Final    Comment:        The GeneXpert MRSA Assay (FDA approved for NASAL specimens only), is one component of a comprehensive MRSA colonization surveillance program. It is not intended to diagnose MRSA infection nor to guide or monitor treatment for MRSA infections.   Urine culture     Status: None   Collection Time: 02/12/15  6:16 AM  Result Value Ref Range Status   Specimen Description URINE, RANDOM  Final   Special Requests NONE  Final   Colony Count   Final    75,000 COLONIES/ML Performed at [facility]    Culture   Final    STAPHYLOCOCCUS SPECIES (COAGULASE NEGATIVE) Note: RIFAMPIN AND GENTAMICIN SHOULD  NOT BE USED AS SINGLE DRUGS FOR TREATMENT OF STAPH INFECTIONS. Performed at [facility]    Report Status 02/15/2015 FINAL  Final   Organism ID, Bacteria STAPHYLOCOCCUS SPECIES (COAGULASE NEGATIVE)  Final      Susceptibility   Staphylococcus species (coagulase negative) - MIC*    GENTAMICIN <=0.5 SENSITIVE Sensitive     LEVOFLOXACIN <=0.12 SENSITIVE Sensitive     NITROFURANTOIN <=16 SENSITIVE Sensitive     OXACILLIN >=4 RESISTANT Resistant     PENICILLIN RESISTANT      RIFAMPIN <=0.5 SENSITIVE Sensitive     TRIMETH/SULFA <=10 SENSITIVE Sensitive     VANCOMYCIN 1 SENSITIVE Sensitive     TETRACYCLINE <=1 SENSITIVE Sensitive     * STAPHYLOCOCCUS SPECIES (COAGULASE NEGATIVE)  Culture, blood (routine x 2)     Status: None   Collection Time: 02/14/15 10:13 PM  Result Value Ref Range Status   Specimen Description BLOOD LEFT ARM  Final   Special Requests BOTTLES DRAWN AEROBIC ONLY 2CC  Final   Culture   Final    STAPHYLOCOCCUS SPECIES (COAGULASE NEGATIVE) Note: RIFAMPIN AND GENTAMICIN SHOULD NOT BE USED AS SINGLE DRUGS FOR TREATMENT OF STAPH INFECTIONS. This organism DOES NOT demonstrate inducible Clindamycin resistance in vitro. Note: Gram Stain Report Called to,Read Back By and Verified With: [facility] STOWE 02/16/15 AT 0240 RIDK Performed at [facility]    Report Status 02/18/2015 FINAL  Final   Organism ID, Bacteria STAPHYLOCOCCUS SPECIES (COAGULASE NEGATIVE)  Final      Susceptibility  Staphylococcus species (coagulase negative) - MIC*    CLINDAMYCIN <=0.25 SENSITIVE Sensitive     ERYTHROMYCIN >=8 RESISTANT Resistant     GENTAMICIN <=0.5 SENSITIVE Sensitive     LEVOFLOXACIN <=0.12 SENSITIVE Sensitive     OXACILLIN <=0.25 SENSITIVE Sensitive     PENICILLIN <=0.03 SENSITIVE Sensitive     RIFAMPIN <=0.5 SENSITIVE Sensitive     TRIMETH/SULFA <=10 SENSITIVE Sensitive     VANCOMYCIN 1 SENSITIVE Sensitive     TETRACYCLINE <=1 SENSITIVE Sensitive     * STAPHYLOCOCCUS  SPECIES (COAGULASE NEGATIVE)  Culture, blood (routine x 2)     Status: None   Collection Time: 02/14/15 10:24 PM  Result Value Ref Range Status   Specimen Description BLOOD LEFT HAND  Final   Special Requests BOTTLES DRAWN AEROBIC ONLY 2CC  Final   Culture   Final    STAPHYLOCOCCUS SPECIES (COAGULASE NEGATIVE) Note: SUSCEPTIBILITIES PERFORMED ON PREVIOUS CULTURE WITHIN THE LAST 5 DAYS. Note: Gram Stain Report Called to,Read Back By and Verified With: [facility] STOWE 02/16/15 AT 0445 RIDK Performed at [facility]    Report Status 02/18/2015 FINAL  Final  Culture, blood (routine x 2)     Status: None   Collection Time: 02/18/15 12:05 PM  Result Value Ref Range Status   Specimen Description BLOOD RIGHT HAND FINGER  Final   Special Requests BOTTLES DRAWN AEROBIC ONLY 1CC  Final   Culture   Final    NO GROWTH 5 DAYS Performed at [facility]    Report Status 02/24/2015 FINAL  Final  Culture, blood (routine x 2)     Status: None   Collection Time: 02/18/15 12:10 PM  Result Value Ref Range Status   Specimen Description BLOOD RIGHT HAND  Final   Special Requests BOTTLES DRAWN AEROBIC ONLY [place]  Final   Culture   Final    NO GROWTH 5 DAYS Performed at [facility]    Report Status 02/24/2015 FINAL  Final    Assessment: 44 y.o. male presented 2/29 with AMS/alcohol intoxication  Anticoagulation: Lovenox being changed to Xarelto for new right DVT(02/19/15). Initially presumed PE but CT neg for PE. no bleeding, dose appropriate, wt = 93 kg. CBC stable.  Infectious Disease: Antibiotics Day #14/14 - Ancef Day #9 CoNS bacteremia. Afebrile, WBC WNL.   Vanc 3/5 >> 3/10 Ancef 3/10 >>should stop 3/18  Goal of Therapy:  Resolution of infection  Plan:  - Ancef 2gm IV Q8H - should end 3/18 (14 days total treatment) - D/c Lovenox - Xarelto 15mg  po BID x 21 days then change to 20mg  daily on 03/22/15 - F/u renal function, pt's clinical condition  Sherlon Handing, PharmD,  BCPS Clinical pharmacist, pager [phone] 03/01/2015 1:33 PM

## 2015-03-01 NOTE — Clinical Social Work Note (Signed)
Patient is alert and oriented today- sitting in chair.  CSW spoke with patient concerning SNF placement- pt states that he able to move around fine and wants to go home.  CSW signing off.  Domenica Reamer, [place] Social Worker [phone]

## 2015-03-01 NOTE — Progress Notes (Signed)
Physical Therapy Treatment Patient Details Name: Richard [name] MRN: [id number] DOB: 1971/08/26 Today's Date: 03/01/2015    History of Present Illness 44 yo male presented with altered mental status and alcohol intoxication. He developed DT's. He has hx of TBI, seizures, ETOH, cocaine.    PT Comments    Patient more steady and with more endurance than last visit.  Feel he may refuse HHPT and may be fine without it, but due to LOS and evident trunk weakness and unsafe use of RW would recommend HHPT for walker safety, fall prevention and general strengthening.  Follow Up Recommendations  Home health PT;Supervision - Intermittent     Equipment Recommendations  Rolling walker with 5" wheels    Recommendations for Other Services       Precautions / Restrictions Precautions Precautions: Fall    Mobility  Bed Mobility               General bed mobility comments: pt up in chair  Transfers Overall transfer level: Needs assistance Equipment used: Rolling walker (2 wheeled) Transfers: Sit to/from Stand Sit to Stand: Supervision         General transfer comment: for safety due to chair unlocked  Ambulation/Gait Ambulation/Gait assistance: Supervision;Min guard Ambulation Distance (Feet): 200 Feet Assistive device: Rolling walker (2 wheeled) Gait Pattern/deviations: Step-through pattern;Trunk flexed;Shuffle;Decreased stride length     General Gait Details: cues for upright posture, pt states cannot stand straight due to back pain; at times leaning over on walker on elbows,  minguard due to decresaed safety awareness and not following directions for safe use of walker.   Stairs            Wheelchair Mobility    Modified Rankin (Stroke Patients Only)       Balance Overall balance assessment: Needs assistance           Standing balance-Leahy Scale: Fair Standing balance comment: stands static erect without UE support and can take steps back to chair  with supervision; reliant on UE support to walk due to back pain, trunk weakness due to prolonged illness                    Cognition Arousal/Alertness: Awake/alert Behavior During Therapy: Impulsive Overall Cognitive Status: No family/caregiver present to determine baseline cognitive functioning (pt alert and oriented, but inappropriate at times with staff, but pleasant, h/o TBI)                      Exercises      General Comments        Pertinent Vitals/Pain Pain Assessment: 0-10 Pain Score: 10-Worst pain ever Pain Location: left side mid back, reports 10/10, but conversant and jovial in hallway Pain Intervention(s): Monitored during session    Home Living                      Prior Function            PT Goals (current goals can now be found in the care plan section) Progress towards PT goals: Progressing toward goals    Frequency  Min 3X/week    PT Plan Discharge plan needs to be updated    Co-evaluation             End of Session Equipment Utilized During Treatment: Gait belt Activity Tolerance: Patient tolerated treatment well Patient left: in chair;with call bell/phone within reach;with nursing/sitter in room     Time: [phone]  PT Time Calculation (min) (ACUTE ONLY): 23 min  Charges:  $Gait Training: 23-37 mins                    G Codes:      Korben Carcione,CYNDI 03-11-15, 12:19 PM  Magda Kiel, [place] Mar 11, 2015

## 2015-03-01 NOTE — Plan of Care (Signed)
Problem: Phase I Progression Outcomes Goal: Pain controlled with appropriate interventions Outcome: Completed/Met Date Met:  03/01/15 Pt has no complaints of pain.
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
## 2015-03-15 ENCOUNTER — Telehealth: Payer: Self-pay | Admitting: Emergency Medicine

## 2015-03-15 NOTE — Telephone Encounter (Signed)
I got a phone call from [place] determination services wanting to know the status of a Medical record release. I spoke with Asencion Partridge, and she had no paperwork associated with the patient. I then called [phone] and had them re-fax the paperwork so Asencion Partridge could release the info.

## 2015-04-30 DIAGNOSIS — Z0289 Encounter for other administrative examinations: Secondary | ICD-10-CM

## 2015-07-06 IMAGING — CR DG CHEST 1V PORT
1 series · 1 of 1 positions shown · non-contrast
Comparison: Prior radiograph from 12/15/2014

CLINICAL DATA: Initial evaluation for acute chest pain, emesis.

EXAM:
PORTABLE CHEST - 1 VIEW

[portable]
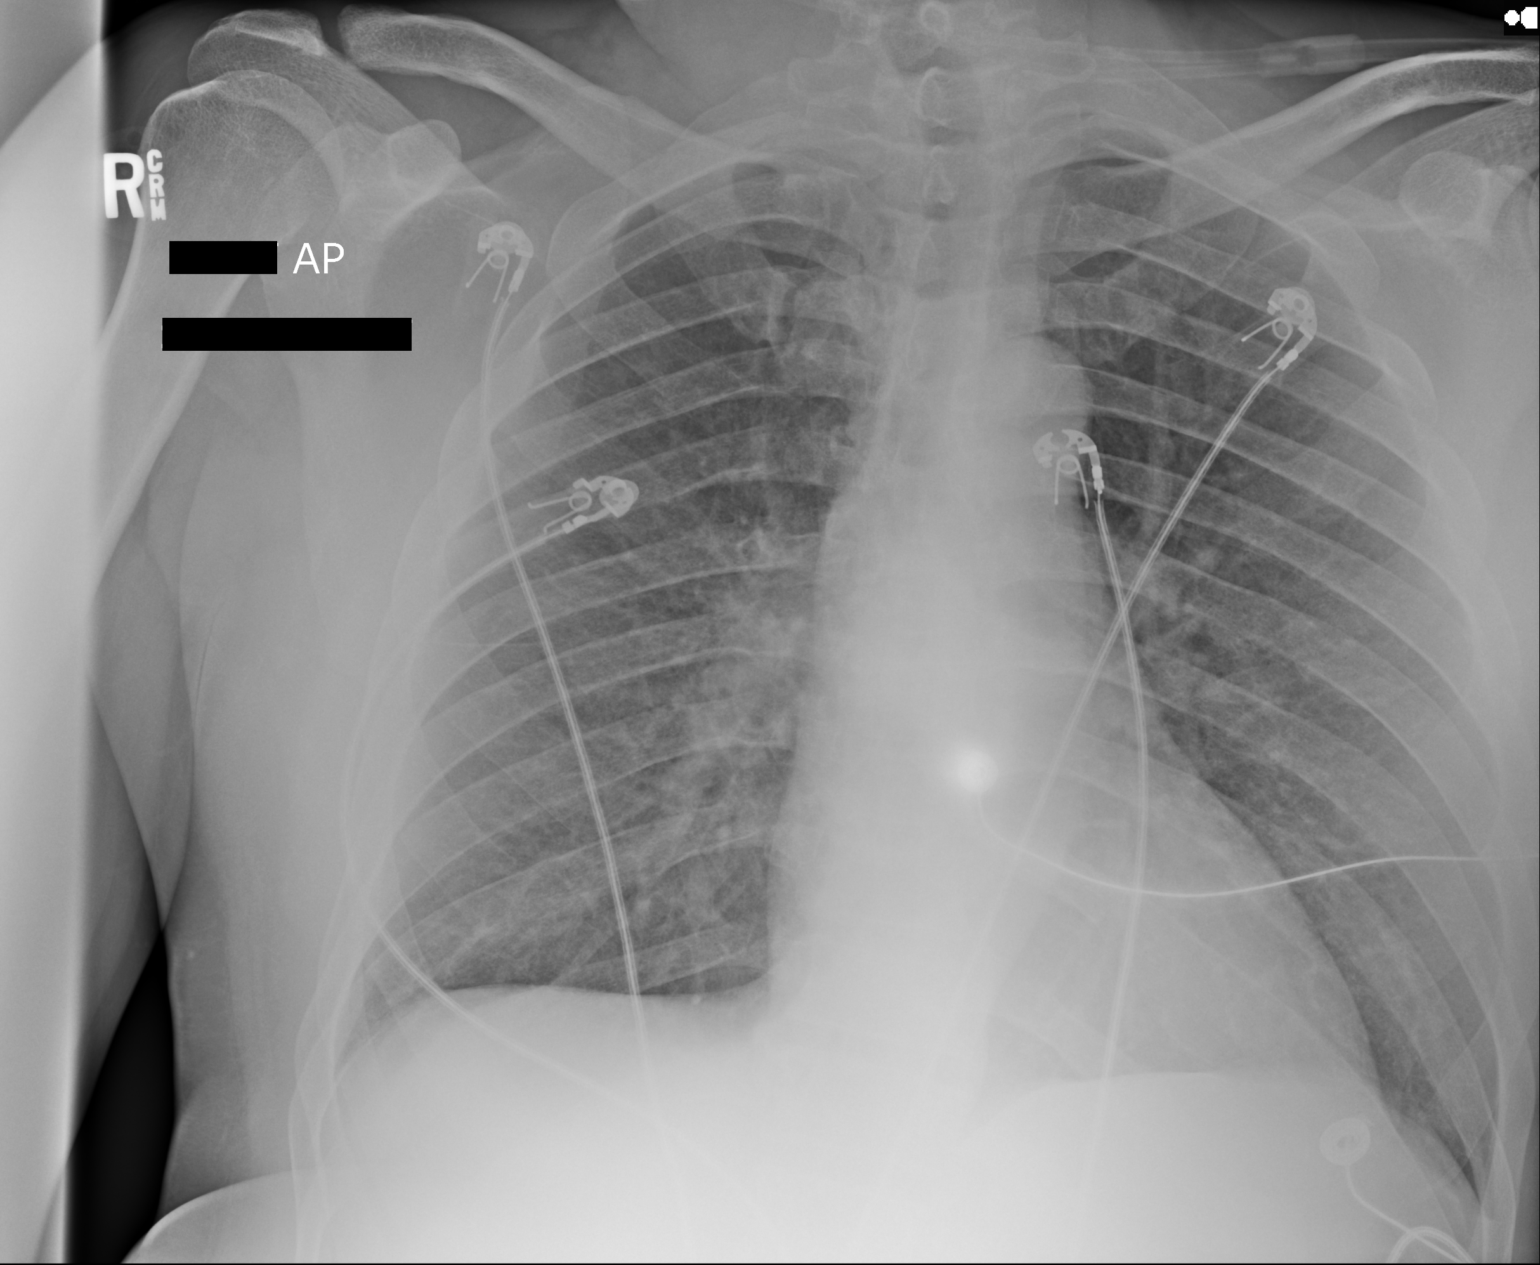

[1 of 1 positions shown; findings below may reference images not displayed]

FINDINGS: Transverse heart size within normal limits on this AP projection.
Mediastinal silhouette within normal limits.

Lungs are normally inflated. Changes compatible with COPD/ emphysema
again seen. No focal infiltrate, pulmonary edema, or pleural
effusion. No pneumothorax.

No acute osseus abnormality.
IMPRESSION: COPD/emphysema. No superimposed active cardiopulmonary disease
identified.

## 2015-07-13 IMAGING — CR DG CHEST 1V PORT
1 series · 1 of 1 positions shown · non-contrast
Comparison: 02/15/2015

CLINICAL DATA: Shortness of breath.  Confused.

EXAM:
PORTABLE CHEST - 1 VIEW

[AP]
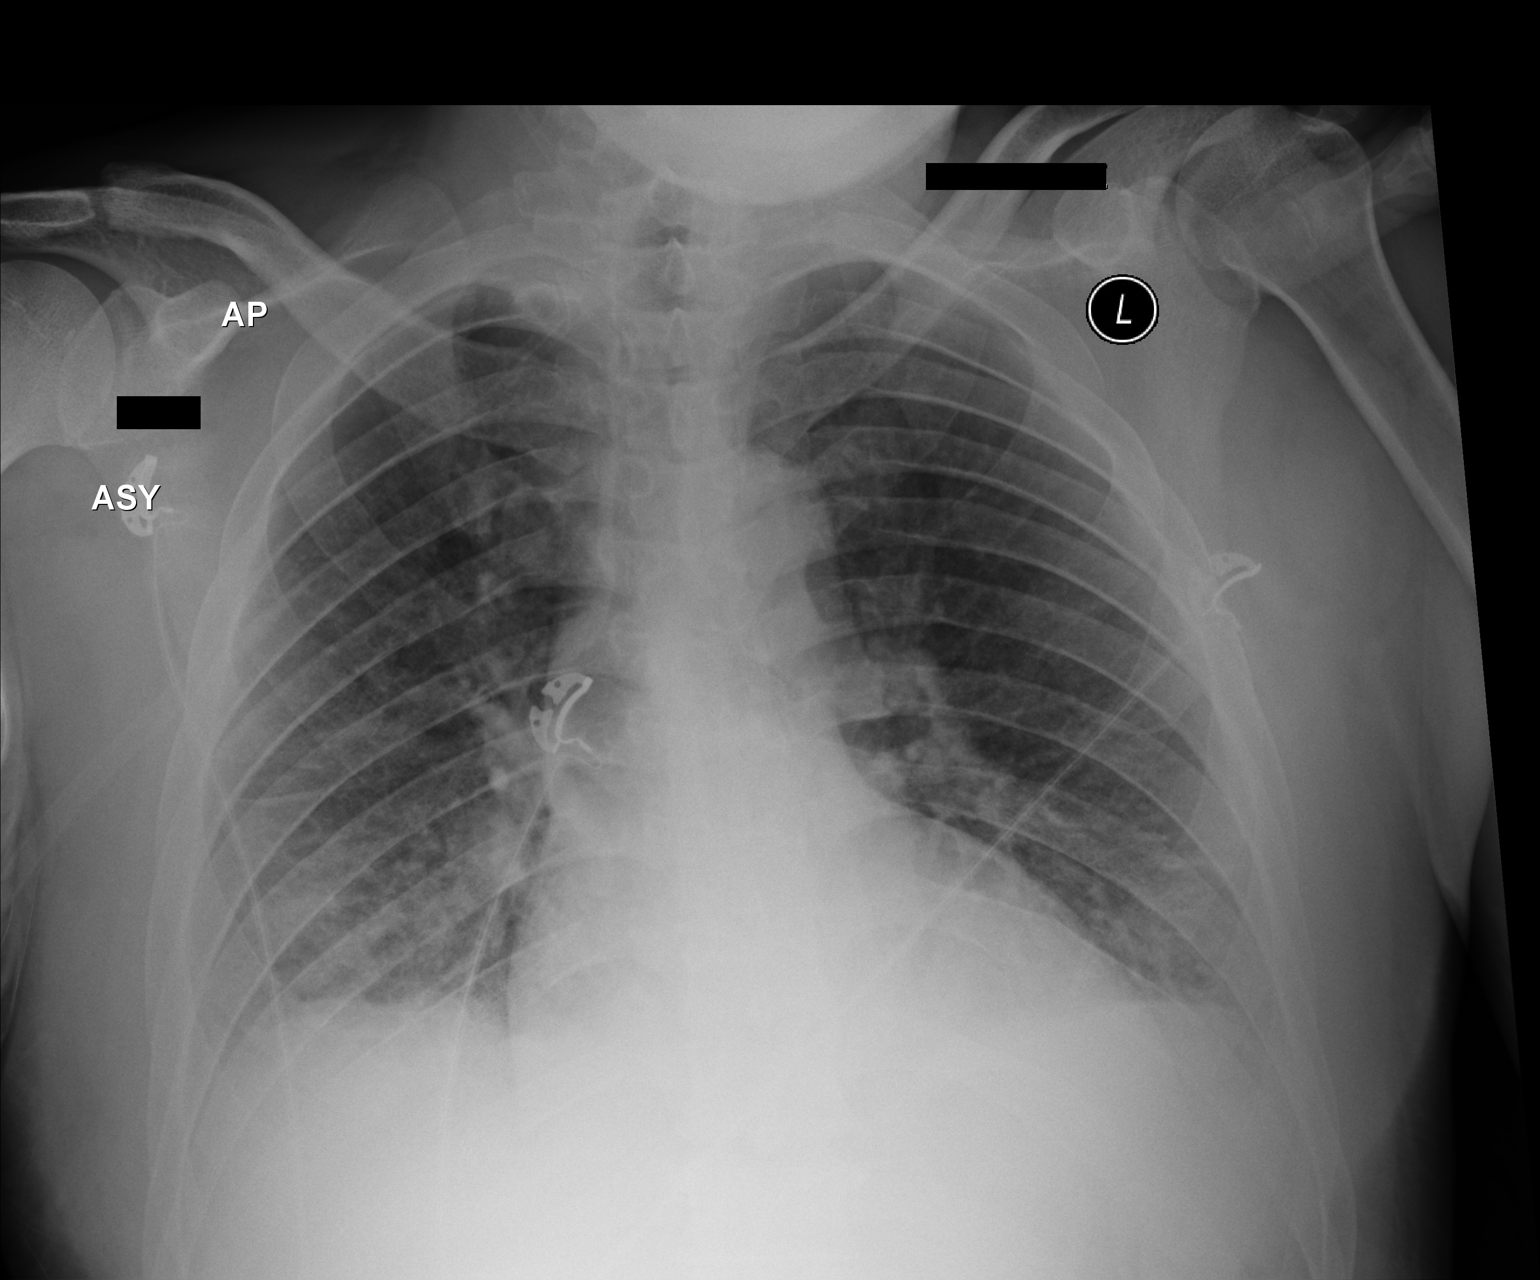

[1 of 1 positions shown; findings below may reference images not displayed]

FINDINGS: Heart is mildly enlarged. There are persistent mild patchy densities
at the lung bases bilaterally consistent with edema or infectious
infiltrates.
IMPRESSION: Mild edema or infiltrates.

## 2015-07-15 ENCOUNTER — Emergency Department (HOSPITAL_COMMUNITY): Payer: Medicare Other

## 2015-07-15 ENCOUNTER — Emergency Department (HOSPITAL_COMMUNITY)
Admission: EM | Admit: 2015-07-15 | Discharge: 2015-07-16 | Disposition: A | Discharge status: Home / Self Care | Payer: Medicare Other | Attending: Emergency Medicine | Admitting: Emergency Medicine

## 2015-07-15 ENCOUNTER — Encounter (HOSPITAL_COMMUNITY): Payer: Self-pay | Admitting: *Deleted

## 2015-07-15 DIAGNOSIS — A419 Sepsis, unspecified organism: Secondary | ICD-10-CM | POA: Diagnosis not present

## 2015-07-15 DIAGNOSIS — Z7982 Long term (current) use of aspirin: Secondary | ICD-10-CM | POA: Insufficient documentation

## 2015-07-15 DIAGNOSIS — K029 Dental caries, unspecified: Secondary | ICD-10-CM | POA: Diagnosis not present

## 2015-07-15 DIAGNOSIS — R05 Cough: Secondary | ICD-10-CM | POA: Diagnosis not present

## 2015-07-15 DIAGNOSIS — R000 Tachycardia, unspecified: Secondary | ICD-10-CM | POA: Insufficient documentation

## 2015-07-15 DIAGNOSIS — Z79899 Other long term (current) drug therapy: Secondary | ICD-10-CM | POA: Diagnosis not present

## 2015-07-15 DIAGNOSIS — J45909 Unspecified asthma, uncomplicated: Secondary | ICD-10-CM | POA: Diagnosis not present

## 2015-07-15 DIAGNOSIS — K047 Periapical abscess without sinus: Secondary | ICD-10-CM | POA: Insufficient documentation

## 2015-07-15 DIAGNOSIS — G40909 Epilepsy, unspecified, not intractable, without status epilepticus: Secondary | ICD-10-CM | POA: Diagnosis not present

## 2015-07-15 DIAGNOSIS — Z5321 Procedure and treatment not carried out due to patient leaving prior to being seen by health care provider: Secondary | ICD-10-CM | POA: Diagnosis not present

## 2015-07-15 DIAGNOSIS — I10 Essential (primary) hypertension: Secondary | ICD-10-CM | POA: Diagnosis not present

## 2015-07-15 DIAGNOSIS — G8929 Other chronic pain: Secondary | ICD-10-CM | POA: Diagnosis not present

## 2015-07-15 DIAGNOSIS — Z5329 Procedure and treatment not carried out because of patient's decision for other reasons: Secondary | ICD-10-CM

## 2015-07-15 DIAGNOSIS — Z720 Tobacco use: Secondary | ICD-10-CM | POA: Insufficient documentation

## 2015-07-15 DIAGNOSIS — F17210 Nicotine dependence, cigarettes, uncomplicated: Secondary | ICD-10-CM | POA: Diagnosis not present

## 2015-07-15 DIAGNOSIS — M79604 Pain in right leg: Secondary | ICD-10-CM | POA: Insufficient documentation

## 2015-07-15 DIAGNOSIS — K088 Other specified disorders of teeth and supporting structures: Secondary | ICD-10-CM | POA: Diagnosis present

## 2015-07-15 DIAGNOSIS — Z5320 Procedure and treatment not carried out because of patient's decision for unspecified reasons: Secondary | ICD-10-CM

## 2015-07-15 DIAGNOSIS — M79601 Pain in right arm: Secondary | ICD-10-CM

## 2015-07-15 LAB — CBC
HCT: 49.9 % (ref 39.0–52.0)
HEMOGLOBIN: 17.5 g/dL — AB (ref 13.0–17.0)
MCH: 31.1 pg (ref 26.0–34.0)
MCHC: 35.1 g/dL (ref 30.0–36.0)
MCV: 88.6 fL (ref 78.0–100.0)
Platelets: 202 10*3/uL (ref 150–400)
RBC: 5.63 MIL/uL (ref 4.22–5.81)
RDW: 14.1 % (ref 11.5–15.5)
WBC: 11.3 10*3/uL — ABNORMAL HIGH (ref 4.0–10.5)

## 2015-07-15 LAB — URINE MICROSCOPIC-ADD ON

## 2015-07-15 LAB — URINALYSIS, ROUTINE W REFLEX MICROSCOPIC
GLUCOSE, UA: NEGATIVE mg/dL
Hgb urine dipstick: NEGATIVE
Ketones, ur: 15 mg/dL — AB
LEUKOCYTES UA: NEGATIVE
Nitrite: NEGATIVE
PH: 5.5 (ref 5.0–8.0)
Protein, ur: 30 mg/dL — AB
Specific Gravity, Urine: 1.037 — ABNORMAL HIGH (ref 1.005–1.030)
UROBILINOGEN UA: 1 mg/dL (ref 0.0–1.0)

## 2015-07-15 LAB — I-STAT CG4 LACTIC ACID, ED
Lactic Acid, Venous: 1.46 mmol/L (ref 0.5–2.0)
Lactic Acid, Venous: 0.62 mmol/L (ref 0.5–2.0)

## 2015-07-15 LAB — COMPREHENSIVE METABOLIC PANEL
ALT: 16 U/L — AB (ref 17–63)
ANION GAP: 11 (ref 5–15)
AST: 22 U/L (ref 15–41)
Albumin: 3.4 g/dL — ABNORMAL LOW (ref 3.5–5.0)
Alkaline Phosphatase: 82 U/L (ref 38–126)
BUN: 9 mg/dL (ref 6–20)
CALCIUM: 9.1 mg/dL (ref 8.9–10.3)
CO2: 18 mmol/L — ABNORMAL LOW (ref 22–32)
Chloride: 109 mmol/L (ref 101–111)
Creatinine, Ser: 1.09 mg/dL (ref 0.61–1.24)
GFR calc Af Amer: >60 mL/min (ref >60)
GFR calc non Af Amer: >60 mL/min (ref >60)
Glucose, Bld: 127 mg/dL — ABNORMAL HIGH (ref 65–99)
Potassium: 3.3 mmol/L — ABNORMAL LOW (ref 3.5–5.1)
SODIUM: 138 mmol/L (ref 135–145)
TOTAL PROTEIN: 7.1 g/dL (ref 6.5–8.1)
Total Bilirubin: 0.4 mg/dL (ref 0.3–1.2)

## 2015-07-15 MED ORDER — AMOXICILLIN-POT CLAVULANATE 875-125 MG PO TABS: 1.0000 | ORAL_TABLET | Freq: Two times a day (BID) | ORAL | Status: DC

## 2015-07-15 MED ORDER — SODIUM CHLORIDE 0.9 % IV BOLUS (SEPSIS)
1000.0000 mL | Freq: Once | INTRAVENOUS | Status: AC
Start: 2015-07-15 — End: 2015-07-16
  Administered 2015-07-15: 1000 mL via INTRAVENOUS

## 2015-07-15 MED ORDER — ACETAMINOPHEN 325 MG PO TABS
ORAL_TABLET | ORAL | Status: AC
  Filled 2015-07-15: qty 2

## 2015-07-15 MED ORDER — SODIUM CHLORIDE 0.9 % IV BOLUS (SEPSIS)
1000.0000 mL | Freq: Once | INTRAVENOUS | Status: AC
  Administered 2015-07-15: 1000 mL via INTRAVENOUS

## 2015-07-15 MED ORDER — SODIUM CHLORIDE 0.9 % IV SOLN
3.0000 g | Freq: Once | INTRAVENOUS | Status: AC
  Administered 2015-07-15: 3 g via INTRAVENOUS
  Filled 2015-07-15: qty 3

## 2015-07-15 MED ORDER — ACETAMINOPHEN 325 MG PO TABS
650.0000 mg | ORAL_TABLET | Freq: Once | ORAL | Status: AC | PRN
  Administered 2015-07-15: 650 mg via ORAL

## 2015-07-15 NOTE — Discharge Instructions (Signed)
Abscessed Tooth An abscessed tooth is an infection around your tooth. It may be caused by holes or damage to the tooth (cavity) or a dental disease. An abscessed tooth causes mild to very bad pain in and around the tooth. See your dentist right away if you have tooth or gum pain. HOME CARE  Take your medicine as told. Finish it even if you start to feel better.  Do not drive after taking pain medicine.  Rinse your mouth (gargle) often with salt water ( teaspoon salt in 8 ounces of warm water).  Do not apply heat to the outside of your face. GET HELP RIGHT AWAY IF:   You have a temperature by mouth above 102 F (38.9 C), not controlled by medicine.  You have chills and a very bad headache.  You have problems breathing or swallowing.  Your mouth will not open.  You develop puffiness (swelling) on the neck or around the eye.  Your pain is not helped by medicine.  Your pain is getting worse instead of better. MAKE SURE YOU:   Understand these instructions.  Will watch your condition.  Will get help right away if you are not doing well or get worse. Document Released: 05/18/2008 Document Revised: 02/22/2012 Document Reviewed: 03/10/2011 [facility] Patient Information 2015 [place], [place]. This information is not intended to replace advice given to you by your health care provider. Make sure you discuss any questions you have with your health care provider.  Sepsis Sepsis is a serious infection of your blood or tissues that affects your whole body. The infection that causes sepsis may be bacterial, viral, fungal, or parasitic. Sepsis may be life threatening. Sepsis can cause your blood pressure to drop. This may result in shock. Shock causes your central nervous system and your organs to stop working correctly.  RISK FACTORS Sepsis can happen in anyone, but it is more likely to happen in people who have weakened immune systems. SIGNS AND SYMPTOMS  Symptoms of sepsis can  include:  Fever or low body temperature (hypothermia).  Rapid breathing (hyperventilation).  Chills.  Rapid heartbeat (tachycardia).  Confusion or light-headedness.  Trouble breathing.  Urinating much less than usual.  Cool, clammy skin or red, flushed skin.  Other problems with the heart, kidneys, or brain. DIAGNOSIS  Your health care provider will likely do tests to look for an infection, to see if the infection has spread to your blood, and to see how serious your condition is. Tests can include:  Blood tests, including cultures of your blood.  Cultures of other fluids from your body, such as:  Urine.  Pus from wounds.  Mucus coughed up from your lungs.  Urine tests other than cultures.  X-ray exams or other imaging tests. TREATMENT  Treatment will begin with elimination of the source of infection. If your sepsis is likely caused by a bacterial or fungal infection, you will be given antibiotic or antifungal medicines. You may also receive:  Oxygen.  Fluids through an IV tube.  Medicines to increase your blood pressure.  A machine to clean your blood (dialysis) if your kidneys fail.  A machine to help you breathe if your lungs fail. SEEK IMMEDIATE MEDICAL CARE IF: You get an infection or develop any of the signs and symptoms of sepsis after surgery or a hospitalization. Document Released: 08/29/2003 Document Revised: 12/05/2013 Document Reviewed: 08/07/2013 [facility] Patient Information 2015 [place], [place]. This information is not intended to replace advice given to you by your health care provider.  Make sure you discuss any questions you have with your health care provider.

## 2015-07-15 NOTE — ED Notes (Signed)
Pt given applesauce, sandwich and coke per EDP. Pt repeatedly stating he wants to leave "because I'm still in f------ pain." Pt willing to take antibiotics for tooth at this time
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
## 2015-07-15 NOTE — ED Provider Notes (Signed)
CSN: [id number]     Arrival date & time 07/15/15  2018 History   First MD Initiated Contact with Patient 07/15/15 2049     Chief Complaint  Patient presents with  . Headache  . Leg Pain  . Dental Pain     (Consider location/radiation/quality/duration/timing/severity/associated sxs/prior Treatment) Patient is a 44 y.o. male presenting with headaches. The history is provided by the patient.  Headache Pain location:  Generalized Quality:  Dull Radiates to:  Does not radiate Onset quality:  Gradual Timing:  Constant Progression:  Unchanged Chronicity:  New Relieved by:  Nothing Worsened by:  Nothing Ineffective treatments:  None tried Associated symptoms: fever (unsure of onset)   Associated symptoms: no weakness   Associated symptoms comment:  Left lower dental pain and swelling of the jaw, chronic right leg pain from remote injury   Past Medical History  Diagnosis Date  . Hypertension   . Seizures     last episode 03/2013  . Asthma   . Brain bleed    Past Surgical History  Procedure Laterality Date  . Leg surgery     Family History  Problem Relation Age of Onset  . Diabetes Mellitus II Sister   . Cancer Father    History  Substance Use Topics  . Smoking status: Current Every Day Smoker -- 1.00 packs/day for 26 years    Types: Cigarettes  . Smokeless tobacco: Never Used  . Alcohol Use: 2.4 oz/week    4 Cans of beer per week     Comment: drinks heavily. "pt sts he only drinks one beer a day now"    Review of Systems  Constitutional: Positive for fever (unsure of onset).  Neurological: Positive for headaches. Negative for weakness.  All other systems reviewed and are negative.     Allergies  Dilaudid  Home Medications   Prior to Admission medications   Medication Sig Start Date End Date Taking? Authorizing Provider  amLODipine (NORVASC) 10 MG tablet Take 1 tablet (10 mg total) by mouth daily. 03/20/14  Yes Charlynne Cousins, MD    Aspirin-Acetaminophen-Caffeine (GOODY HEADACHE PO) Take 1 Package by mouth daily as needed (for pain).   Yes Historical Provider, MD  atenolol (TENORMIN) 50 MG tablet Take 1 tablet (50 mg total) by mouth daily. 03/20/14  Yes Charlynne Cousins, MD  clonazePAM (KLONOPIN) 0.5 MG tablet Take 1 tablet (0.5 mg total) by mouth 2 (two) times daily. 03/01/15  Yes Reyne Dumas, MD  cloNIDine (CATAPRES) 0.1 MG tablet Take 0.1 mg by mouth 2 (two) times daily. 11/23/14  Yes Historical Provider, MD  folic acid (FOLVITE) 1 MG tablet Take 1 tablet (1 mg total) by mouth daily. 03/01/15  Yes Reyne Dumas, MD  furosemide (LASIX) 40 MG tablet Take 1 tablet (40 mg total) by mouth daily. 03/01/15  Yes Reyne Dumas, MD  hydrALAZINE (APRESOLINE) 50 MG tablet Take 1 tablet (50 mg total) by mouth every 8 (eight) hours. 03/20/14  Yes Charlynne Cousins, MD  levETIRAcetam (KEPPRA) 500 MG tablet Take 1 tablet (500 mg total) by mouth 2 (two) times daily. 03/01/15  Yes Reyne Dumas, MD  metoprolol tartrate (LOPRESSOR) 25 MG tablet Take 1 tablet (25 mg total) by mouth 2 (two) times daily. 03/01/15  Yes Reyne Dumas, MD  nicotine (NICODERM CQ - DOSED IN MG/24 HOURS) 21 mg/24hr patch Place 1 patch (21 mg total) onto the skin daily. 03/01/15  Yes Reyne Dumas, MD  omeprazole (PRILOSEC) 40 MG capsule Take 40 mg by  mouth daily. 01/22/15  Yes Historical Provider, MD  QUEtiapine (SEROQUEL) 50 MG tablet Take 1 tablet (50 mg total) by mouth 2 (two) times daily. 03/01/15  Yes Reyne Dumas, MD  rivaroxaban (XARELTO) 20 MG TABS tablet Take 1 tablet (20 mg total) by mouth daily with supper. 03/22/15 09/01/15 Yes Reyne Dumas, MD  thiamine 100 MG tablet Take 1 tablet (100 mg total) by mouth daily. 03/01/15  Yes Reyne Dumas, MD  amoxicillin-clavulanate (AUGMENTIN) 875-125 MG per tablet Take 1 tablet by mouth every 12 (twelve) hours. 07/15/15   Leo Grosser, MD  Rivaroxaban (XARELTO) 15 MG TABS tablet Take 1 tablet (15 mg total) by mouth 2 (two) times daily  with a meal. 03/01/15 03/22/15  Reyne Dumas, MD   BP 163/104 mmHg  Pulse 92  Temp(Src) 101 F (38.3 C) (Oral)  Resp 26  Ht '5\' 9"'$  (1.753 m)  Wt 200 lb (90.719 kg)  BMI 29.52 kg/m2  SpO2 89% Physical Exam  Constitutional: He is oriented to person, place, and time. He appears well-developed and well-nourished. No distress.  HENT:  Head: Normocephalic and atraumatic.  Mouth/Throat: Dental abscesses and dental caries present.    Swelling over left side of mandible and tenderness over gumline with severe periodontal disease  Eyes: Conjunctivae are normal.  Neck: Neck supple. No tracheal deviation present.  Cardiovascular: Regular rhythm.  Tachycardia present.   Pulmonary/Chest: Effort normal. No respiratory distress.  Abdominal: Soft. He exhibits no distension.  Neurological: He is alert and oriented to person, place, and time.  Skin: Skin is warm and dry.  Psychiatric: He has a normal mood and affect.    ED Course  Procedures (including critical care time) Labs Review Labs Reviewed  COMPREHENSIVE METABOLIC PANEL - Abnormal; Notable for the following:    Potassium 3.3 (*)    CO2 18 (*)    Glucose, Bld 127 (*)    Albumin 3.4 (*)    ALT 16 (*)    All other components within normal limits  URINALYSIS, ROUTINE W REFLEX MICROSCOPIC (NOT AT [facility]) - Abnormal; Notable for the following:    Color, Urine AMBER (*)    Specific Gravity, Urine 1.037 (*)    Bilirubin Urine SMALL (*)    Ketones, ur 15 (*)    Protein, ur 30 (*)    All other components within normal limits  CBC - Abnormal; Notable for the following:    WBC 11.3 (*)    Hemoglobin 17.5 (*)    All other components within normal limits  URINE MICROSCOPIC-ADD ON - Abnormal; Notable for the following:    Squamous Epithelial / LPF FEW (*)    Bacteria, UA FEW (*)    All other components within normal limits  CULTURE, BLOOD (ROUTINE X 2)  CULTURE, BLOOD (ROUTINE X 2)  URINE CULTURE  I-STAT CG4 LACTIC ACID, ED  I-STAT CG4  LACTIC ACID, ED    Imaging Review Dg Chest 2 View  07/15/2015   CLINICAL DATA:  Cough  EXAM: CHEST  2 VIEW  COMPARISON:  02/25/2015  FINDINGS: There is mild hyperinflation. The lungs are clear. Hilar, mediastinal and cardiac contours are un and unchanged. There are no effusions. Pulmonary vasculature is normal.  IMPRESSION: No active cardiopulmonary disease.   Electronically Signed   By: Andreas Newport M.D.   On: 07/15/2015 21:20     EKG Interpretation None      MDM   Final diagnoses:  Dental abscess  Sepsis, due to unspecified organism  Left against  medical advice  Chronic hypertension  Chronic leg pain, right   After thorough assessment of mouth and teeth, Pt appears to have dental caries with abscess that is not approachable via gingival I&D. No evidence of extension into mandible/maxilla, no ludwig's angina.    I am concerned with the patient's fever, tachycardia, elevated white count and ill appearance that he could be bacteremic from this dental infection. I discussed inpatient management regarding sepsis and following blood cultures for sensitivities and the patient refused to be admitted to the hospital. I explained that normally we would continue inpatient management with IV antibiotics and monitor his clinical course to prevent downstream complications of the infection including endocarditis, worsening infection, or death. The patient expressed understanding and still wished to go home despite my recommendations. He appears to sound mind and does not have signs of severe sepsis or septic shock to warrant involuntary admission to the hospital. He agreed to a single dose of IV antibiotics here and oral antibiotics for home but will be leaving [place].  Pt was advised that definitive care for dental abscess is with a dental health professional.      Leo Grosser, MD 07/15/15 [id number]

## 2015-07-16 NOTE — ED Notes (Signed)
Pt sleeping. 

## 2015-07-17 LAB — URINE CULTURE: Culture: NO GROWTH

## 2015-07-20 LAB — CULTURE, BLOOD (ROUTINE X 2)
Culture: NO GROWTH
Culture: NO GROWTH

## 2015-08-25 ENCOUNTER — Emergency Department (HOSPITAL_COMMUNITY): Payer: Medicare Other

## 2015-08-25 ENCOUNTER — Encounter (HOSPITAL_COMMUNITY): Payer: Self-pay | Admitting: *Deleted

## 2015-08-25 ENCOUNTER — Inpatient Hospital Stay (HOSPITAL_COMMUNITY)
Admission: EM | Admit: 2015-08-25 | Discharge: 2015-09-10 | DRG: 896 | Disposition: A | Discharge status: Home / Self Care | Payer: Medicare Other | Attending: Pulmonary Disease | Admitting: Pulmonary Disease

## 2015-08-25 DIAGNOSIS — Z86718 Personal history of other venous thrombosis and embolism: Secondary | ICD-10-CM

## 2015-08-25 DIAGNOSIS — I10 Essential (primary) hypertension: Secondary | ICD-10-CM | POA: Diagnosis present

## 2015-08-25 DIAGNOSIS — E11649 Type 2 diabetes mellitus with hypoglycemia without coma: Secondary | ICD-10-CM | POA: Diagnosis not present

## 2015-08-25 DIAGNOSIS — J9811 Atelectasis: Secondary | ICD-10-CM | POA: Insufficient documentation

## 2015-08-25 DIAGNOSIS — Z720 Tobacco use: Secondary | ICD-10-CM | POA: Diagnosis not present

## 2015-08-25 DIAGNOSIS — I6783 Posterior reversible encephalopathy syndrome: Secondary | ICD-10-CM | POA: Diagnosis not present

## 2015-08-25 DIAGNOSIS — R404 Transient alteration of awareness: Secondary | ICD-10-CM | POA: Diagnosis not present

## 2015-08-25 DIAGNOSIS — I824Z9 Acute embolism and thrombosis of unspecified deep veins of unspecified distal lower extremity: Secondary | ICD-10-CM | POA: Diagnosis present

## 2015-08-25 DIAGNOSIS — F10231 Alcohol dependence with withdrawal delirium: Secondary | ICD-10-CM | POA: Diagnosis not present

## 2015-08-25 DIAGNOSIS — J690 Pneumonitis due to inhalation of food and vomit: Secondary | ICD-10-CM | POA: Diagnosis not present

## 2015-08-25 DIAGNOSIS — R918 Other nonspecific abnormal finding of lung field: Secondary | ICD-10-CM | POA: Diagnosis not present

## 2015-08-25 DIAGNOSIS — N508 Other specified disorders of male genital organs: Secondary | ICD-10-CM | POA: Diagnosis not present

## 2015-08-25 DIAGNOSIS — N5089 Other specified disorders of the male genital organs: Secondary | ICD-10-CM | POA: Insufficient documentation

## 2015-08-25 DIAGNOSIS — E876 Hypokalemia: Secondary | ICD-10-CM | POA: Diagnosis not present

## 2015-08-25 DIAGNOSIS — F17210 Nicotine dependence, cigarettes, uncomplicated: Secondary | ICD-10-CM | POA: Diagnosis present

## 2015-08-25 DIAGNOSIS — Z4682 Encounter for fitting and adjustment of non-vascular catheter: Secondary | ICD-10-CM | POA: Diagnosis not present

## 2015-08-25 DIAGNOSIS — Z7901 Long term (current) use of anticoagulants: Secondary | ICD-10-CM

## 2015-08-25 DIAGNOSIS — J45909 Unspecified asthma, uncomplicated: Secondary | ICD-10-CM | POA: Diagnosis present

## 2015-08-25 DIAGNOSIS — R569 Unspecified convulsions: Secondary | ICD-10-CM | POA: Diagnosis not present

## 2015-08-25 DIAGNOSIS — R55 Syncope and collapse: Secondary | ICD-10-CM | POA: Diagnosis not present

## 2015-08-25 DIAGNOSIS — Z87820 Personal history of traumatic brain injury: Secondary | ICD-10-CM

## 2015-08-25 DIAGNOSIS — G9340 Encephalopathy, unspecified: Secondary | ICD-10-CM | POA: Diagnosis not present

## 2015-08-25 DIAGNOSIS — G40909 Epilepsy, unspecified, not intractable, without status epilepticus: Secondary | ICD-10-CM | POA: Diagnosis present

## 2015-08-25 DIAGNOSIS — N433 Hydrocele, unspecified: Secondary | ICD-10-CM | POA: Diagnosis not present

## 2015-08-25 DIAGNOSIS — I674 Hypertensive encephalopathy: Secondary | ICD-10-CM | POA: Diagnosis not present

## 2015-08-25 DIAGNOSIS — F17200 Nicotine dependence, unspecified, uncomplicated: Secondary | ICD-10-CM | POA: Diagnosis present

## 2015-08-25 DIAGNOSIS — R450 Nervousness: Secondary | ICD-10-CM | POA: Diagnosis not present

## 2015-08-25 DIAGNOSIS — Z0189 Encounter for other specified special examinations: Secondary | ICD-10-CM

## 2015-08-25 DIAGNOSIS — E162 Hypoglycemia, unspecified: Secondary | ICD-10-CM | POA: Diagnosis not present

## 2015-08-25 DIAGNOSIS — I160 Hypertensive urgency: Secondary | ICD-10-CM | POA: Diagnosis present

## 2015-08-25 DIAGNOSIS — F488 Other specified nonpsychotic mental disorders: Secondary | ICD-10-CM | POA: Diagnosis not present

## 2015-08-25 DIAGNOSIS — K219 Gastro-esophageal reflux disease without esophagitis: Secondary | ICD-10-CM | POA: Diagnosis present

## 2015-08-25 DIAGNOSIS — R339 Retention of urine, unspecified: Secondary | ICD-10-CM | POA: Diagnosis not present

## 2015-08-25 DIAGNOSIS — I824Z1 Acute embolism and thrombosis of unspecified deep veins of right distal lower extremity: Secondary | ICD-10-CM | POA: Diagnosis not present

## 2015-08-25 DIAGNOSIS — R7309 Other abnormal glucose: Secondary | ICD-10-CM | POA: Diagnosis not present

## 2015-08-25 DIAGNOSIS — I824Z2 Acute embolism and thrombosis of unspecified deep veins of left distal lower extremity: Secondary | ICD-10-CM | POA: Diagnosis not present

## 2015-08-25 DIAGNOSIS — R6889 Other general symptoms and signs: Secondary | ICD-10-CM | POA: Diagnosis not present

## 2015-08-25 DIAGNOSIS — R5600 Simple febrile convulsions: Secondary | ICD-10-CM | POA: Diagnosis not present

## 2015-08-25 LAB — COMPREHENSIVE METABOLIC PANEL
ALK PHOS: 73 U/L (ref 38–126)
ALT: 13 U/L — AB (ref 17–63)
AST: 16 U/L (ref 15–41)
Albumin: 3.8 g/dL (ref 3.5–5.0)
Anion gap: 9 (ref 5–15)
BUN: 9 mg/dL (ref 6–20)
CALCIUM: 9 mg/dL (ref 8.9–10.3)
CHLORIDE: 105 mmol/L (ref 101–111)
CO2: 23 mmol/L (ref 22–32)
CREATININE: 0.99 mg/dL (ref 0.61–1.24)
GFR calc Af Amer: >60 mL/min (ref >60)
GFR calc non Af Amer: >60 mL/min (ref >60)
Glucose, Bld: 81 mg/dL (ref 65–99)
Potassium: 3.7 mmol/L (ref 3.5–5.1)
SODIUM: 137 mmol/L (ref 135–145)
Total Bilirubin: 0.6 mg/dL (ref 0.3–1.2)
Total Protein: 7 g/dL (ref 6.5–8.1)

## 2015-08-25 LAB — CBC WITH DIFFERENTIAL/PLATELET
Basophils Absolute: 0 10*3/uL (ref 0.0–0.1)
Basophils Relative: 0 % (ref 0–1)
EOS ABS: 0 10*3/uL (ref 0.0–0.7)
EOS PCT: 0 % (ref 0–5)
HCT: 47.7 % (ref 39.0–52.0)
Hemoglobin: 16.1 g/dL (ref 13.0–17.0)
Lymphocytes Relative: 19 % (ref 12–46)
Lymphs Abs: 2.1 10*3/uL (ref 0.7–4.0)
MCH: 31.3 pg (ref 26.0–34.0)
MCHC: 33.8 g/dL (ref 30.0–36.0)
MCV: 92.8 fL (ref 78.0–100.0)
MONOS PCT: 6 % (ref 3–12)
Monocytes Absolute: 0.7 10*3/uL (ref 0.1–1.0)
Neutro Abs: 8.3 10*3/uL — ABNORMAL HIGH (ref 1.7–7.7)
Neutrophils Relative %: 75 % (ref 43–77)
PLATELETS: 246 10*3/uL (ref 150–400)
RBC: 5.14 MIL/uL (ref 4.22–5.81)
RDW: 15.2 % (ref 11.5–15.5)
WBC: 11.1 10*3/uL — ABNORMAL HIGH (ref 4.0–10.5)

## 2015-08-25 LAB — RAPID URINE DRUG SCREEN, HOSP PERFORMED
Amphetamines: NOT DETECTED
Barbiturates: NOT DETECTED
Benzodiazepines: NOT DETECTED
Cocaine: NOT DETECTED
OPIATES: NOT DETECTED
Tetrahydrocannabinol: NOT DETECTED

## 2015-08-25 LAB — CBG MONITORING, ED: Glucose-Capillary: 85 mg/dL (ref 65–99)

## 2015-08-25 LAB — ETHANOL: ALCOHOL ETHYL (B): 42 mg/dL — AB (ref <5)

## 2015-08-25 MED ORDER — LABETALOL HCL 5 MG/ML IV SOLN
20.0000 mg | Freq: Once | INTRAVENOUS | Status: AC
  Administered 2015-08-25: 20 mg via INTRAVENOUS
  Filled 2015-08-25: qty 4

## 2015-08-25 MED ORDER — CLONIDINE HCL 0.2 MG PO TABS
0.2000 mg | ORAL_TABLET | Freq: Once | ORAL | Status: AC
  Administered 2015-08-25: 0.2 mg via ORAL
  Filled 2015-08-25: qty 1

## 2015-08-25 MED ORDER — CLONIDINE HCL 0.2 MG PO TABS
0.3000 mg | ORAL_TABLET | Freq: Once | ORAL | Status: AC
  Administered 2015-08-25: 0.3 mg via ORAL
  Filled 2015-08-25 (×2): qty 1

## 2015-08-25 NOTE — ED Notes (Signed)
Pt states that he took his BP meds this morning.

## 2015-08-25 NOTE — ED Provider Notes (Signed)
CSN: [id number]     Arrival date & time 08/25/15  1955 History   First MD Initiated Contact with Patient 08/25/15 2009     Chief Complaint  Patient presents with  . Hypoglycemia      HPI Pt arrives via EMS from home. pts wife called EMS when pt sat on the floor and began acting abnormal. EMS reports that pt has a hx of seizures but was not post-ictal. Pt was alert and answering questions. EMS checked CBG and it was 62 (no hx of diabetes). EMS got pt to eat a peanut butter bar and CBG came up to 91. Pt c/o headache - pts wife reports that that is normal for pt. Past Medical History  Diagnosis Date  . Hypertension   . Seizures     last episode 03/2013  . Asthma   . Brain bleed    Past Surgical History  Procedure Laterality Date  . Leg surgery     Family History  Problem Relation Age of Onset  . Diabetes Mellitus II Sister   . Cancer Father    Social History  Substance Use Topics  . Smoking status: Current Every Day Smoker -- 1.00 packs/day for 26 years    Types: Cigarettes  . Smokeless tobacco: Never Used  . Alcohol Use: 2.4 oz/week    4 Cans of beer per week     Comment: drinks heavily. "pt sts he only drinks one beer a day now"    Review of Systems  All other systems reviewed and are negative  Allergies  Dilaudid  Home Medications   Prior to Admission medications   Medication Sig Start Date End Date Taking? Authorizing Provider  amLODipine (NORVASC) 10 MG tablet Take 1 tablet (10 mg total) by mouth daily. 03/20/14   Charlynne Cousins, MD  amoxicillin-clavulanate (AUGMENTIN) 875-125 MG per tablet Take 1 tablet by mouth every 12 (twelve) hours. 07/15/15   Leo Grosser, MD  Aspirin-Acetaminophen-Caffeine (GOODY HEADACHE PO) Take 1 Package by mouth daily as needed (for pain).    Historical Provider, MD  atenolol (TENORMIN) 50 MG tablet Take 1 tablet (50 mg total) by mouth daily. 03/20/14   Charlynne Cousins, MD  clonazePAM (KLONOPIN) 0.5 MG tablet Take 1 tablet (0.5 mg  total) by mouth 2 (two) times daily. 03/01/15   Reyne Dumas, MD  cloNIDine (CATAPRES) 0.1 MG tablet Take 0.1 mg by mouth 2 (two) times daily. 11/23/14   Historical Provider, MD  folic acid (FOLVITE) 1 MG tablet Take 1 tablet (1 mg total) by mouth daily. 03/01/15   Reyne Dumas, MD  furosemide (LASIX) 40 MG tablet Take 1 tablet (40 mg total) by mouth daily. 03/01/15   Reyne Dumas, MD  hydrALAZINE (APRESOLINE) 50 MG tablet Take 1 tablet (50 mg total) by mouth every 8 (eight) hours. 03/20/14   Charlynne Cousins, MD  levETIRAcetam (KEPPRA) 500 MG tablet Take 1 tablet (500 mg total) by mouth 2 (two) times daily. 03/01/15   Reyne Dumas, MD  metoprolol tartrate (LOPRESSOR) 25 MG tablet Take 1 tablet (25 mg total) by mouth 2 (two) times daily. 03/01/15   Reyne Dumas, MD  nicotine (NICODERM CQ - DOSED IN MG/24 HOURS) 21 mg/24hr patch Place 1 patch (21 mg total) onto the skin daily. 03/01/15   Reyne Dumas, MD  omeprazole (PRILOSEC) 40 MG capsule Take 40 mg by mouth daily. 01/22/15   Historical Provider, MD  QUEtiapine (SEROQUEL) 50 MG tablet Take 1 tablet (50 mg total) by mouth  2 (two) times daily. 03/01/15   Reyne Dumas, MD  Rivaroxaban (XARELTO) 15 MG TABS tablet Take 1 tablet (15 mg total) by mouth 2 (two) times daily with a meal. 03/01/15 03/22/15  Reyne Dumas, MD  rivaroxaban (XARELTO) 20 MG TABS tablet Take 1 tablet (20 mg total) by mouth daily with supper. 03/22/15 09/01/15  Reyne Dumas, MD  thiamine 100 MG tablet Take 1 tablet (100 mg total) by mouth daily. 03/01/15   Reyne Dumas, MD   BP 182/116 mmHg  Pulse 86  Temp(Src) 97.9 F (36.6 C) (Oral)  Resp 14  Ht   SpO2 95% Physical Exam Physical Exam  Nursing note and vitals reviewed. Constitutional: He is oriented to person, place, and time. He appears well-developed and well-nourished. No distress.  HENT:  Head: Normocephalic and atraumatic.  Eyes: Pupils are equal, round, and reactive to light.  Neck: Normal range of motion.  Cardiovascular:  Normal rate and intact distal pulses.   Pulmonary/Chest: No respiratory distress.  Abdominal: Normal appearance. He exhibits no distension.  Musculoskeletal: Normal range of motion.  Neurological: He is alert and oriented to person, place, and time.  Speech per her slow.  Slight droop on left eyelid noted.   Skin: Skin is warm and dry. No rash noted.    ED Course  Procedures (including critical care time)  Medications  cloNIDine (CATAPRES) tablet 0.3 mg (0.3 mg Oral Given 08/25/15 2142)  cloNIDine (CATAPRES) tablet 0.2 mg (0.2 mg Oral Given 08/25/15 2223)  labetalol (NORMODYNE,TRANDATE) injection 20 mg (20 mg Intravenous Given 08/25/15 2318)    Labs Review Labs Reviewed  ETHANOL - Abnormal; Notable for the following:    Alcohol, Ethyl (B) 42 (*)    All other components within normal limits  COMPREHENSIVE METABOLIC PANEL - Abnormal; Notable for the following:    ALT 13 (*)    All other components within normal limits  CBC WITH DIFFERENTIAL/PLATELET - Abnormal; Notable for the following:    WBC 11.1 (*)    Neutro Abs 8.3 (*)    All other components within normal limits  URINE RAPID DRUG SCREEN, HOSP PERFORMED  CBG MONITORING, ED    Imaging Review Ct Head Wo Contrast  08/25/2015   CLINICAL DATA:  Altered level of consciousness.  EXAM: CT HEAD WITHOUT CONTRAST  TECHNIQUE: Contiguous axial images were obtained from the base of the skull through the vertex without intravenous contrast.  COMPARISON:  02/11/2015  FINDINGS: Skull and Sinuses:Chronic sinusitis with sclerosis and extensive opacification of the left maxillary sinus. No fracture or destructive process.  Orbits: No acute abnormality.  Brain: No evidence of acute infarction, hemorrhage, hydrocephalus, or mass lesion/mass effect.  Stable extensive cortical and subcortical gliosis in the left frontal lobe. Patchy cerebral white matter low density throughout the bilateral cerebral hemispheres is stable, gliosis and dilated perivascular  spaces based on brain MRI from earlier this year.  IMPRESSION: 1. Stable exam.  No acute finding. 2. Extensive left frontal gliosis and white matter disease.   Electronically Signed   By: Monte Fantasia M.D.   On: 08/25/2015 21:42   I have personally reviewed and evaluated these images and lab results as part of my medical decision-making.  CRITICAL CARE Performed by: Dot Lanes Total critical care time: 30 min Critical care time was exclusive of separately billable procedures and treating other patients. Critical care was necessary to treat or prevent imminent or life-threatening deterioration. Critical care was time spent personally by me on the following activities: development of  treatment plan with patient and/or surrogate as well as nursing, discussions with consultants, evaluation of patient's response to treatment, examination of patient, obtaining history from patient or surrogate, ordering and performing treatments and interventions, ordering and review of laboratory studies, ordering and review of radiographic studies, pulse oximetry and re-evaluation of patient's condition.   MDM   Final diagnoses:  Hypertensive urgency        Leonard Schwartz, MD 08/25/15 2340

## 2015-08-25 NOTE — ED Notes (Signed)
CBG 85

## 2015-08-25 NOTE — ED Notes (Signed)
EMS also reports that pt smells of ETOH and pt admitted to 1 beer and then a winecooler. Pt now denies ETOH use.

## 2015-08-25 NOTE — ED Notes (Signed)
Pt arrives via EMS from home. pts wife called EMS when pt sat on the floor and began acting abnormal. EMS reports that pt has a hx of seizures but was not post-ictal. Pt was alert and answering questions. EMS checked CBG and it was 62 (no hx of diabetes). EMS got pt to eat a peanut butter bar and CBG came up to 91. Pt c/o headache - pts wife reports that that is normal for pt.

## 2015-08-25 NOTE — ED Notes (Signed)
Pt now on the phone stating that "the hospital is not doing me no good, she made me mad." will not answer anymore triage questions.

## 2015-08-26 ENCOUNTER — Encounter (HOSPITAL_COMMUNITY): Payer: Self-pay | Admitting: *Deleted

## 2015-08-26 ENCOUNTER — Other Ambulatory Visit (HOSPITAL_COMMUNITY): Payer: Self-pay

## 2015-08-26 DIAGNOSIS — R450 Nervousness: Secondary | ICD-10-CM

## 2015-08-26 DIAGNOSIS — Z720 Tobacco use: Secondary | ICD-10-CM

## 2015-08-26 DIAGNOSIS — G9340 Encephalopathy, unspecified: Secondary | ICD-10-CM | POA: Diagnosis present

## 2015-08-26 DIAGNOSIS — R569 Unspecified convulsions: Secondary | ICD-10-CM

## 2015-08-26 DIAGNOSIS — I824Z2 Acute embolism and thrombosis of unspecified deep veins of left distal lower extremity: Secondary | ICD-10-CM

## 2015-08-26 DIAGNOSIS — I10 Essential (primary) hypertension: Secondary | ICD-10-CM

## 2015-08-26 DIAGNOSIS — R55 Syncope and collapse: Secondary | ICD-10-CM | POA: Diagnosis present

## 2015-08-26 LAB — MAGNESIUM: Magnesium: 1.7 mg/dL (ref 1.7–2.4)

## 2015-08-26 LAB — CBC
HCT: 43.9 % (ref 39.0–52.0)
Hemoglobin: 14.6 g/dL (ref 13.0–17.0)
MCH: 30.7 pg (ref 26.0–34.0)
MCHC: 33.3 g/dL (ref 30.0–36.0)
MCV: 92.2 fL (ref 78.0–100.0)
PLATELETS: 249 10*3/uL (ref 150–400)
RBC: 4.76 MIL/uL (ref 4.22–5.81)
RDW: 15.1 % (ref 11.5–15.5)
WBC: 11.5 10*3/uL — AB (ref 4.0–10.5)

## 2015-08-26 LAB — BASIC METABOLIC PANEL
Anion gap: 8 (ref 5–15)
BUN: 9 mg/dL (ref 6–20)
CHLORIDE: 108 mmol/L (ref 101–111)
CO2: 23 mmol/L (ref 22–32)
CREATININE: 1 mg/dL (ref 0.61–1.24)
Calcium: 8.6 mg/dL — ABNORMAL LOW (ref 8.9–10.3)
GFR calc non Af Amer: >60 mL/min (ref >60)
Glucose, Bld: 147 mg/dL — ABNORMAL HIGH (ref 65–99)
POTASSIUM: 4.1 mmol/L (ref 3.5–5.1)
SODIUM: 139 mmol/L (ref 135–145)

## 2015-08-26 LAB — MRSA PCR SCREENING: MRSA by PCR: NEGATIVE

## 2015-08-26 LAB — GLUCOSE, CAPILLARY: Glucose-Capillary: 117 mg/dL — ABNORMAL HIGH (ref 65–99)

## 2015-08-26 MED ORDER — CETYLPYRIDINIUM CHLORIDE 0.05 % MT LIQD
7.0000 mL | Freq: Two times a day (BID) | OROMUCOSAL | Status: DC
  Administered 2015-08-27 – 2015-09-06 (×12): 7 mL via OROMUCOSAL

## 2015-08-26 MED ORDER — HYDRALAZINE HCL 20 MG/ML IJ SOLN
10.0000 mg | INTRAMUSCULAR | Status: DC | PRN
  Administered 2015-08-26 – 2015-09-01 (×19): 20 mg via INTRAVENOUS
  Administered 2015-09-01: 40 mg via INTRAVENOUS
  Administered 2015-09-01 – 2015-09-05 (×9): 20 mg via INTRAVENOUS
  Administered 2015-09-06: 10 mg via INTRAVENOUS
  Administered 2015-09-09: 40 mg via INTRAVENOUS
  Filled 2015-08-26 (×7): qty 1
  Filled 2015-08-26: qty 2
  Filled 2015-08-26 (×22): qty 1
  Filled 2015-08-26: qty 2

## 2015-08-26 MED ORDER — HALOPERIDOL LACTATE 5 MG/ML IJ SOLN
INTRAMUSCULAR | Status: AC
  Filled 2015-08-26: qty 1

## 2015-08-26 MED ORDER — ALUM & MAG HYDROXIDE-SIMETH 200-200-20 MG/5ML PO SUSP: 30.0000 mL | Freq: Four times a day (QID) | ORAL | Status: DC | PRN

## 2015-08-26 MED ORDER — ONDANSETRON HCL 4 MG/2ML IJ SOLN: 4.0000 mg | Freq: Four times a day (QID) | INTRAMUSCULAR | Status: DC | PRN

## 2015-08-26 MED ORDER — LORAZEPAM 2 MG/ML IJ SOLN
2.0000 mg | INTRAMUSCULAR | Status: DC | PRN
  Administered 2015-08-26: 2 mg via INTRAVENOUS
  Filled 2015-08-26 (×2): qty 1

## 2015-08-26 MED ORDER — HALOPERIDOL LACTATE 5 MG/ML IJ SOLN
2.5000 mg | Freq: Once | INTRAMUSCULAR | Status: AC
  Administered 2015-08-26: 2.5 mg via INTRAVENOUS

## 2015-08-26 MED ORDER — LORAZEPAM 2 MG/ML IJ SOLN: 2.0000 mg | Freq: Once | INTRAMUSCULAR | Status: AC

## 2015-08-26 MED ORDER — QUETIAPINE FUMARATE 50 MG PO TABS: 50.0000 mg | ORAL_TABLET | Freq: Two times a day (BID) | ORAL | Status: DC

## 2015-08-26 MED ORDER — LORAZEPAM 2 MG/ML IJ SOLN
1.0000 mg | Freq: Four times a day (QID) | INTRAMUSCULAR | Status: DC | PRN
  Administered 2015-08-26: 2 mg via INTRAVENOUS
  Filled 2015-08-26: qty 1

## 2015-08-26 MED ORDER — NICOTINE 14 MG/24HR TD PT24
14.0000 mg | MEDICATED_PATCH | Freq: Every day | TRANSDERMAL | Status: DC
  Administered 2015-08-26 – 2015-09-06 (×12): 14 mg via TRANSDERMAL
  Filled 2015-08-26 (×15): qty 1

## 2015-08-26 MED ORDER — CHLORHEXIDINE GLUCONATE 0.12 % MT SOLN
15.0000 mL | Freq: Two times a day (BID) | OROMUCOSAL | Status: DC
  Administered 2015-08-26 – 2015-09-05 (×16): 15 mL via OROMUCOSAL
  Filled 2015-08-26 (×12): qty 15

## 2015-08-26 MED ORDER — LORAZEPAM 2 MG/ML IJ SOLN
2.0000 mg | INTRAMUSCULAR | Status: AC
  Administered 2015-08-26: 2 mg via INTRAVENOUS
  Filled 2015-08-26: qty 1

## 2015-08-26 MED ORDER — MIDAZOLAM HCL 2 MG/2ML IJ SOLN: 1.0000 mg | INTRAMUSCULAR | Status: DC | PRN

## 2015-08-26 MED ORDER — FUROSEMIDE 40 MG PO TABS
40.0000 mg | ORAL_TABLET | Freq: Every day | ORAL | Status: DC
  Administered 2015-08-26: 40 mg via ORAL
  Filled 2015-08-26: qty 1

## 2015-08-26 MED ORDER — OXYCODONE HCL 5 MG PO TABS
5.0000 mg | ORAL_TABLET | ORAL | Status: DC | PRN
  Filled 2015-08-26: qty 1

## 2015-08-26 MED ORDER — RIVAROXABAN 20 MG PO TABS: 20.0000 mg | ORAL_TABLET | Freq: Every day | ORAL | Status: DC

## 2015-08-26 MED ORDER — LABETALOL HCL 200 MG PO TABS
200.0000 mg | ORAL_TABLET | Freq: Two times a day (BID) | ORAL | Status: DC
  Administered 2015-08-26: 200 mg via ORAL
  Filled 2015-08-26 (×2): qty 1

## 2015-08-26 MED ORDER — PANTOPRAZOLE SODIUM 40 MG PO TBEC
40.0000 mg | DELAYED_RELEASE_TABLET | Freq: Every day | ORAL | Status: DC
  Administered 2015-08-26: 40 mg via ORAL
  Filled 2015-08-26: qty 1

## 2015-08-26 MED ORDER — LORAZEPAM 2 MG/ML IJ SOLN: 1.0000 mg | INTRAMUSCULAR | Status: DC | PRN

## 2015-08-26 MED ORDER — LORAZEPAM 2 MG/ML IJ SOLN
1.0000 mg | INTRAMUSCULAR | Status: DC | PRN
Start: 2015-08-26 — End: 2015-08-27

## 2015-08-26 MED ORDER — ACETAMINOPHEN 325 MG PO TABS: 650.0000 mg | ORAL_TABLET | Freq: Four times a day (QID) | ORAL | Status: DC | PRN

## 2015-08-26 MED ORDER — FOLIC ACID 1 MG PO TABS
1.0000 mg | ORAL_TABLET | Freq: Every day | ORAL | Status: DC
  Administered 2015-08-26: 1 mg via ORAL
  Filled 2015-08-26: qty 1

## 2015-08-26 MED ORDER — HALOPERIDOL LACTATE 5 MG/ML IJ SOLN: 2.5000 mg | Freq: Once | INTRAMUSCULAR | Status: AC

## 2015-08-26 MED ORDER — CLONAZEPAM 0.5 MG PO TABS
0.5000 mg | ORAL_TABLET | Freq: Two times a day (BID) | ORAL | Status: DC
  Administered 2015-08-26 (×2): 0.5 mg via ORAL
  Filled 2015-08-26 (×2): qty 1

## 2015-08-26 MED ORDER — SODIUM CHLORIDE 0.9 % IV SOLN
250.0000 mL | INTRAVENOUS | Status: DC | PRN
  Administered 2015-08-29 – 2015-09-03 (×3): 250 mL via INTRAVENOUS

## 2015-08-26 MED ORDER — SODIUM CHLORIDE 0.9 % IJ SOLN
3.0000 mL | Freq: Two times a day (BID) | INTRAMUSCULAR | Status: DC
  Administered 2015-08-26 – 2015-09-10 (×26): 3 mL via INTRAVENOUS

## 2015-08-26 MED ORDER — HYDRALAZINE HCL 20 MG/ML IJ SOLN
10.0000 mg | INTRAMUSCULAR | Status: DC | PRN
  Filled 2015-08-26 (×2): qty 1

## 2015-08-26 MED ORDER — SODIUM CHLORIDE 0.9 % IJ SOLN
3.0000 mL | INTRAMUSCULAR | Status: DC | PRN
  Administered 2015-08-29 – 2015-09-01 (×2): 3 mL via INTRAVENOUS
  Filled 2015-08-26 (×2): qty 3

## 2015-08-26 MED ORDER — LORAZEPAM 2 MG/ML IJ SOLN
2.0000 mg | Freq: Once | INTRAMUSCULAR | Status: AC
  Administered 2015-08-26: 2 mg via INTRAVENOUS

## 2015-08-26 MED ORDER — ONDANSETRON HCL 4 MG PO TABS: 4.0000 mg | ORAL_TABLET | Freq: Four times a day (QID) | ORAL | Status: DC | PRN

## 2015-08-26 MED ORDER — QUETIAPINE FUMARATE 50 MG PO TABS
50.0000 mg | ORAL_TABLET | Freq: Two times a day (BID) | ORAL | Status: DC
  Administered 2015-08-26: 50 mg via ORAL
  Filled 2015-08-26: qty 1

## 2015-08-26 MED ORDER — LEVETIRACETAM 500 MG PO TABS
500.0000 mg | ORAL_TABLET | Freq: Two times a day (BID) | ORAL | Status: DC
  Administered 2015-08-26: 500 mg via ORAL
  Filled 2015-08-26: qty 1

## 2015-08-26 MED ORDER — CLONIDINE HCL 0.1 MG PO TABS
0.1000 mg | ORAL_TABLET | Freq: Two times a day (BID) | ORAL | Status: DC
  Administered 2015-08-26: 0.1 mg via ORAL
  Filled 2015-08-26: qty 1

## 2015-08-26 MED ORDER — VITAMIN B-1 100 MG PO TABS: 100.0000 mg | ORAL_TABLET | Freq: Every day | ORAL | Status: DC

## 2015-08-26 MED ORDER — DEXMEDETOMIDINE HCL IN NACL 400 MCG/100ML IV SOLN
0.2000 ug/kg/h | INTRAVENOUS | Status: AC
  Administered 2015-08-26: 0.4 ug/kg/h via INTRAVENOUS
  Administered 2015-08-26: 0.2 ug/kg/h via INTRAVENOUS
  Administered 2015-08-26: 0.6 ug/kg/h via INTRAVENOUS
  Administered 2015-08-27 (×2): 0.7 ug/kg/h via INTRAVENOUS
  Administered 2015-08-27: 0.6 ug/kg/h via INTRAVENOUS
  Filled 2015-08-26: qty 100
  Filled 2015-08-26: qty 50
  Filled 2015-08-26: qty 100
  Filled 2015-08-26 (×5): qty 50

## 2015-08-26 MED ORDER — THIAMINE HCL 100 MG/ML IJ SOLN
100.0000 mg | Freq: Every day | INTRAMUSCULAR | Status: DC
  Administered 2015-08-26 – 2015-09-05 (×11): 100 mg via INTRAVENOUS
  Filled 2015-08-26: qty 2
  Filled 2015-08-26: qty 1
  Filled 2015-08-26 (×9): qty 2
  Filled 2015-08-26: qty 1

## 2015-08-26 MED ORDER — HYDRALAZINE HCL 50 MG PO TABS
50.0000 mg | ORAL_TABLET | Freq: Three times a day (TID) | ORAL | Status: DC
  Administered 2015-08-26: 50 mg via ORAL
  Filled 2015-08-26 (×2): qty 1

## 2015-08-26 MED ORDER — SODIUM CHLORIDE 0.9 % IV SOLN
500.0000 mg | Freq: Two times a day (BID) | INTRAVENOUS | Status: DC
  Administered 2015-08-26 – 2015-09-08 (×27): 500 mg via INTRAVENOUS
  Filled 2015-08-26 (×31): qty 5

## 2015-08-26 MED ORDER — LABETALOL HCL 5 MG/ML IV SOLN
10.0000 mg | INTRAVENOUS | Status: DC | PRN
  Administered 2015-08-31 – 2015-09-03 (×6): 20 mg via INTRAVENOUS
  Administered 2015-09-03: 10 mg via INTRAVENOUS
  Administered 2015-09-07: 20 mg via INTRAVENOUS
  Filled 2015-08-26 (×9): qty 4

## 2015-08-26 MED ORDER — ONDANSETRON HCL 4 MG/2ML IJ SOLN
4.0000 mg | Freq: Three times a day (TID) | INTRAMUSCULAR | Status: DC | PRN
Start: 2015-08-26 — End: 2015-08-26

## 2015-08-26 MED ORDER — AMLODIPINE BESYLATE 10 MG PO TABS
10.0000 mg | ORAL_TABLET | Freq: Every day | ORAL | Status: DC
  Administered 2015-08-26: 10 mg via ORAL
  Filled 2015-08-26: qty 1

## 2015-08-26 MED ORDER — HALOPERIDOL LACTATE 5 MG/ML IJ SOLN: 1.0000 mg | INTRAMUSCULAR | Status: DC | PRN

## 2015-08-26 MED ORDER — ACETAMINOPHEN 650 MG RE SUPP: 650.0000 mg | Freq: Four times a day (QID) | RECTAL | Status: DC | PRN

## 2015-08-26 NOTE — Progress Notes (Signed)
Called nurse to discuss EEG order; pt very combative, in 4 point restraints. Nurse advised that EEG be attempted tomorrow.

## 2015-08-26 NOTE — Progress Notes (Signed)
md made aware of pt's slurred drunk like speech.  Pt is alert to self.  Refuses am meds.  Pt educated.  No new orders.  Will continue to monitor. Saunders Revel T

## 2015-08-26 NOTE — Clinical Social Work Psych Note (Signed)
Psych CSW was consulted for the completion of Involuntary Commitment paperwork (IVC).  Psych CSW completed said paperwork at the request of the MD and faxed the paperwork into the magistrate.  Psych CSW confirmed magistrate receipt.  Patient IVC expires 09/01/2015 and is unable to leave the hospital AMA.  Nonnie Done, LCSW [phone]  Psychiatric & Orthopedics (5N 1-8) Clinical Social Worker

## 2015-08-26 NOTE — Progress Notes (Signed)
Pt becoming increasingly more agitated, yelling he is "going to leave" and is attempting to get out of bed to get clothing out of closet. Patient is very unsteady on his feet and has almost fallen on several attempts. Patient is flailing arms and is a threat to staff. Dr Maryland Pink notified and ordered additional dose of ativan and to IVC patient. Social work notified promptly to start paperwork and security called and at bedside. At 12:10 patient refused to stay in bed so 4 point restraints were applied and patient explained the condition to release these. Patient is only oriented to himself at this time and has no other complaints other then he is leaving. Patients mother notified of his current condition by MD. Kennon Holter ordered and at bedside at 12:30. Will con't to monitor patients condition very closely.

## 2015-08-26 NOTE — Care Management Note (Signed)
Case Management Note  Patient Details  Name: Richard [name] MRN: [id number] Date of Birth: 1971/02/01  Subjective/Objective:   Adm w htn crisis, etoh abus                 Action/Plan:lives w fam, pcp dr Annitta Needs   Expected Discharge Date:                 Expected Discharge Plan:     In-House Referral:     Discharge planning Services     Post Acute Care Choice:    Choice offered to:     DME Arranged:    DME Agency:     HH Arranged:    [place] Agency:     Status of Service:     Medicare Important Message Given:    Date Medicare IM Given:    Medicare IM give by:    Date Additional Medicare IM Given:    Additional Medicare Important Message give by:     If discussed at [place] of Stay Meetings, dates discussed:    Additional Comments: ur review done  Lacretia Leigh, RN 08/26/2015, 10:30 AM

## 2015-08-26 NOTE — Progress Notes (Addendum)
[facility] PROGRESS NOTE  Richard [name] HQP:[id number] DOB: [birth year] DOA: 08/25/2015  PCP: [name], MD  Brief HPI: 44 year old African-American male with a past medical history of hypertension, seizure disorder, left lower extremity DVT on anticoagulation, presented with syncopal episode. He was also noted to have significantly elevated blood pressures. He was confused. He was hospitalized for further management.  Past medical history:  Past Medical History  Diagnosis Date  . Hypertension   . Seizures     last episode 03/2013  . Asthma   . Brain bleed     Consultants: None  Procedures: None  Antibiotics: None  Subjective: Patient very belligerent and agitated this morning. Attempting to leave his room. He still confused.   Objective: Vital Signs  Filed Vitals:   08/26/15 0415 08/26/15 0430 08/26/15 0535 08/26/15 0737  BP: 139/98  160/103 170/108  Pulse:    68  Temp:    97.7 F (36.5 C)  TempSrc:    Oral  Resp: '19 24  24  '$ Height:      Weight:      SpO2: 90% 92%  88%   No intake or output data in the 24 hours ending 08/26/15 0810 Filed Weights   08/26/15 0147  Weight: 92.3 kg (203 lb 7.8 oz)    General appearance: combative and distracted Resp: clear to auscultation bilaterally Cardio: S1, S2 is tachycardic, regular. No S3, S4. No rubs, murmurs, or bruit. No pedal edema GI: soft, non-tender; bowel sounds normal; no masses,  no organomegaly Extremities: extremities normal, atraumatic, no cyanosis or edema Pulses: 2+ and symmetric Neurologic: Alert. Mumbling. Tongue is midline. No facial asymmetry. Motor strength is equal bilateral upper and lower extremities. Acting belligerent.  Lab Results:  Basic Metabolic Panel:  Recent Labs Lab 08/25/15 2041 08/26/15 0254  NA 137 139  K 3.7 4.1  CL 105 108  CO2 23 23  GLUCOSE 81 147*  BUN 9 9  CREATININE 0.99 1.00  CALCIUM 9.0 8.6*   Liver Function Tests:  Recent Labs Lab 08/25/15 2041   AST 16  ALT 13*  ALKPHOS 73  BILITOT 0.6  PROT 7.0  ALBUMIN 3.8   CBC:  Recent Labs Lab 08/25/15 2041 08/26/15 0254  WBC 11.1* 11.5*  NEUTROABS 8.3*  --   HGB 16.1 14.6  HCT 47.7 43.9  MCV 92.8 92.2  PLT 246 249    CBG:  Recent Labs Lab 08/25/15 2007  GLUCAP 85    Recent Results (from the past 240 hour(s))  MRSA PCR Screening     Status: None   Collection Time: 08/26/15  2:00 AM  Result Value Ref Range Status   MRSA by PCR NEGATIVE NEGATIVE Final    Comment:        The GeneXpert MRSA Assay (FDA approved for NASAL specimens only), is one component of a comprehensive MRSA colonization surveillance program. It is not intended to diagnose MRSA infection nor to guide or monitor treatment for MRSA infections.       Studies/Results: Ct Head Wo Contrast  08/25/2015   CLINICAL DATA:  Altered level of consciousness.  EXAM: CT HEAD WITHOUT CONTRAST  TECHNIQUE: Contiguous axial images were obtained from the base of the skull through the vertex without intravenous contrast.  COMPARISON:  02/11/2015  FINDINGS: Skull and Sinuses:Chronic sinusitis with sclerosis and extensive opacification of the left maxillary sinus. No fracture or destructive process.  Orbits: No acute abnormality.  Brain: No evidence of acute infarction, hemorrhage, hydrocephalus, or mass lesion/mass  effect.  Stable extensive cortical and subcortical gliosis in the left frontal lobe. Patchy cerebral white matter low density throughout the bilateral cerebral hemispheres is stable, gliosis and dilated perivascular spaces based on brain MRI from earlier this year.  IMPRESSION: 1. Stable exam.  No acute finding. 2. Extensive left frontal gliosis and white matter disease.   Electronically Signed   By: Monte Fantasia M.D.   On: 08/25/2015 21:42   Dg Chest Portable 1 View  08/26/2015   CLINICAL DATA:  Initial evaluation for hypertension.  EXAM: PORTABLE CHEST - 1 VIEW  COMPARISON:  Prior radiograph from  07/15/2015.  FINDINGS: There is mild accentuation of the cardiac silhouette related AP technique and shallow lung inflation. Mediastinal silhouettes within normal limits.  The lungs are hypoinflated. Mild secondary diffuse bronchovascular crowding. No airspace consolidation, pleural effusion, or pulmonary edema is identified. There is no pneumothorax.  No acute osseous abnormality identified.  IMPRESSION: Shallow lung inflation with secondary bibasilar bronchovascular crowding. No other active cardiopulmonary disease.   Electronically Signed   By: Jeannine Boga M.D.   On: 08/26/2015 00:00    Medications:  Scheduled: . amLODipine  10 mg Oral Daily  . cloNIDine  0.1 mg Oral BID  . folic acid  1 mg Oral Daily  . furosemide  40 mg Oral Daily  . haloperidol lactate  2.5 mg Intravenous Once  . hydrALAZINE  50 mg Oral 3 times per day  . labetalol  200 mg Oral BID  . levETIRAcetam  500 mg Intravenous Q12H  . LORazepam  2 mg Intravenous Once  . nicotine  14 mg Transdermal Daily  . pantoprazole  40 mg Oral Daily  . [START ON 08/27/2015] QUEtiapine  50 mg Oral BID  . rivaroxaban  20 mg Oral Q supper  . sodium chloride  3 mL Intravenous Q12H  . thiamine IV  100 mg Intravenous Daily   Continuous:  CZY:SAYTKZ chloride, acetaminophen **OR** acetaminophen, alum & mag hydroxide-simeth, hydrALAZINE, LORazepam, ondansetron **OR** ondansetron (ZOFRAN) IV, oxyCODONE, sodium chloride  Assessment/Plan:  Principal Problem:   Hypertensive urgency Active Problems:   TOBACCO ABUSE   Convulsions   Lower leg DVT (deep venous thromboembolism), acute   Syncope    Acute Encephalopathy More suggestive of an intoxicated state rather than withdrawal process. He is unsafe to be discharged. He is unsafe to himself if allowed to leave. He has been involuntarily committed. Continue medical management with sedative agents. Get psychiatry opinion in the morning.  Hypertensive urgency Blood pressure had improved  overnight. Weight has gone up again, primarily due to agitation. Continue current medications.  Syncope versus seizure Unclear if he really had a generalized tonic-clonic activity. EEG was ordered. However, patient is not going to be cooperative with the study. In his current state. He hasn't had any seizures in the hospital so far. Continue Keppra for now. Monitor.  History of seizure disorder As above. Continue Keppra.  History of left lower extremity DVT Continue Xarelto.  Alcohol intoxication Alcohol level was 42. He has previous history of alcohol abuse. Back in March he had a similar presentation and required Precedex. Continue to monitor him in step down. Not currently going through withdrawal at this time. But is at high risk for same. Continue Ativan. Thiamine.  ADDENDUM Called by Dr. Titus Mould. Patient is now very agitated. Now appears to be more of a withdrawal process rather than encephalopathy from intoxication. He has failed multiple doses of Ativan and Haldol. It is reasonable to try Precedex.  He will be transferred to ICU and will be under CCM care.  DVT Prophylaxis: On full anticoagulation    Code Status: Full code  Family Communication: Discussed with his mother  Disposition Plan: Will remain in step down     LOS: 1 day   [facility] Pager [phone] 08/26/2015, 8:10 AM  If 7PM-7AM, please contact night-coverage at [email or web address], password [email or web address]

## 2015-08-26 NOTE — Progress Notes (Signed)
eLink Physician-Brief Progress Note Patient Name: Ikenna Ohms DOB: 06-25-1971 MRN: [id number]   Date of Service  08/26/2015  HPI/Events of Note  Called to room. Worsening ETPH WD Failed ativan haldol  eICU Interventions  To icu, precedex     Intervention Category Major Interventions: Change in mental status - evaluation and management  FEINSTEIN,DANIEL J. 08/26/2015, 4:30 PM

## 2015-08-26 NOTE — Consult Note (Signed)
PULMONARY / CRITICAL CARE MEDICINE   Name: Richard [name] MRN: [id number] DOB: [birth year]    ADMISSION DATE:  08/25/2015 CONSULTATION DATE:  08/26/2015  REFERRING MD : [place] Pink  CHIEF COMPLAINT:  HTN  INITIAL PRESENTATION: 44 year old male with substance abuse history admitted 9/11 for hypertensive urgency. Shortly after admission developed agitation requiring several doses of ativan and haldol with minimal response. 9/12 PCCM consulted for potential precedex.   STUDIES:  MRI brain 02/2015 > Stable chronic encephalomalacia in the anterior left frontal lobe. Stable atrophy and white matter disease bilaterally 9/11 CT head > Stable exam. No acute finding. Extensive left frontal gliosis and white matter disease.  SIGNIFICANT EVENTS:   HISTORY OF PRESENT ILLNESS: 44 year old male with PMH as below, which includes HTN, EtOH abuse with prolonged detox in past requiring 2 weeks precedex, remote hx of cocaine abuse, asthma, seizures, DVT on xarelto, and remote TBI. He presented to ED 9/11 after syncopal episode at home earlier that day. He did not feel well and noted high BP, tried to correct this himself at home, but was unable to do so. He complained of headache, became confused, and passed out per wife. She called EMS and he was transported to ED. In ED he was noted to by hypertensive with SBP 182/116. Mental status was noted to be intact at that time. Of note EtOH was elevated. He was admitted by the hospitalist team and treated with IV labetalol and PO clonidine in addition to his home meds of amlodipine and hydralazine. 9/12 early AM he started having some alterations in his mental status, starting with slurring of speech and progressing to agitation and confusion requiring chemical and mechanical restraints. This was refractory to several doses of Ativan and 5 mg haldol. PCCM consulted for possible Precedex administration.   PAST MEDICAL HISTORY :   has a past medical history of Hypertension;  Seizures; Asthma; and Brain bleed.  has past surgical history that includes Leg Surgery. Prior to Admission medications   Medication Sig Start Date End Date Taking? Authorizing Provider  amLODipine (NORVASC) 10 MG tablet Take 1 tablet (10 mg total) by mouth daily. 03/20/14  Yes Charlynne Cousins, MD  Aspirin-Acetaminophen-Caffeine (GOODY HEADACHE PO) Take 1 Package by mouth daily as needed (for pain).   Yes Historical Provider, MD  atenolol (TENORMIN) 50 MG tablet Take 1 tablet (50 mg total) by mouth daily. 03/20/14  Yes Charlynne Cousins, MD  clonazePAM (KLONOPIN) 0.5 MG tablet Take 1 tablet (0.5 mg total) by mouth 2 (two) times daily. 03/01/15  Yes Reyne Dumas, MD  cloNIDine (CATAPRES) 0.1 MG tablet Take 0.1 mg by mouth 2 (two) times daily. 11/23/14  Yes Historical Provider, MD  folic acid (FOLVITE) 1 MG tablet Take 1 tablet (1 mg total) by mouth daily. 03/01/15  Yes Reyne Dumas, MD  furosemide (LASIX) 40 MG tablet Take 1 tablet (40 mg total) by mouth daily. 03/01/15  Yes Reyne Dumas, MD  hydrALAZINE (APRESOLINE) 50 MG tablet Take 1 tablet (50 mg total) by mouth every 8 (eight) hours. 03/20/14  Yes Charlynne Cousins, MD  levETIRAcetam (KEPPRA) 500 MG tablet Take 1 tablet (500 mg total) by mouth 2 (two) times daily. 03/01/15  Yes Reyne Dumas, MD  metoprolol tartrate (LOPRESSOR) 25 MG tablet Take 1 tablet (25 mg total) by mouth 2 (two) times daily. 03/01/15  Yes Reyne Dumas, MD  omeprazole (PRILOSEC) 40 MG capsule Take 40 mg by mouth daily. 01/22/15  Yes Historical Provider, MD  QUEtiapine (SEROQUEL) 50 MG tablet Take 1 tablet (50 mg total) by mouth 2 (two) times daily. 03/01/15  Yes Reyne Dumas, MD  rivaroxaban (XARELTO) 20 MG TABS tablet Take 1 tablet (20 mg total) by mouth daily with supper. 03/22/15 09/01/15 Yes Reyne Dumas, MD  thiamine 100 MG tablet Take 1 tablet (100 mg total) by mouth daily. 03/01/15  Yes Reyne Dumas, MD  amoxicillin-clavulanate (AUGMENTIN) 875-125 MG per tablet Take 1 tablet  by mouth every 12 (twelve) hours. Patient not taking: Reported on 08/25/2015 07/15/15   Leo Grosser, MD  nicotine (NICODERM CQ - DOSED IN MG/24 HOURS) 21 mg/24hr patch Place 1 patch (21 mg total) onto the skin daily. Patient not taking: Reported on 08/25/2015 03/01/15   Reyne Dumas, MD  Rivaroxaban (XARELTO) 15 MG TABS tablet Take 1 tablet (15 mg total) by mouth 2 (two) times daily with a meal. 03/01/15 03/22/15  Reyne Dumas, MD   Allergies  Allergen Reactions  . Dilaudid [Hydromorphone Hcl] Other (See Comments)    Bradycardia and Nausea, hyperthermia    FAMILY HISTORY:  indicated that his mother is alive. He indicated that his father is deceased.  SOCIAL HISTORY:  reports that he has been smoking Cigarettes.  He has a 26 pack-year smoking history. He has never used smokeless tobacco. He reports that he drinks about 2.4 oz of alcohol per week. He reports that he uses illicit drugs (Cocaine).  REVIEW OF SYSTEMS:  Unable due to encephalopathy  SUBJECTIVE:   VITAL SIGNS: Temp:  [97.6 F (36.4 C)-98 F (36.7 C)] 97.6 F (36.4 C) (09/12 1157) Pulse Rate:  [64-104] 104 (09/12 1157) Resp:  [14-28] 21 (09/12 1600) BP: (133-211)/(77-141) 172/125 mmHg (09/12 1600) SpO2:  [88 %-98 %] 94 % (09/12 1600) Weight:  [92.3 kg (203 lb 7.8 oz)] 92.3 kg (203 lb 7.8 oz) (09/12 0147) HEMODYNAMICS:   VENTILATOR SETTINGS:   INTAKE / OUTPUT:  Intake/Output Summary (Last 24 hours) at 08/26/15 1641 Last data filed at 08/26/15 1617  Gross per 24 hour  Intake     60 ml  Output   1950 ml  Net  -1890 ml    PHYSICAL EXAMINATION: General:  Male of normal body habitus with waxing and waning MS Neuro:  Somnolent, but quickly arouses to verbal stimuli to a state of agitation. Disoriented.  HEENT:  [place]/AT, no JVD, PERRL Cardiovascular:  RRR, no MRG Lungs:  Clear bilateral breath sounds Abdomen:  Soft, non-tender, non-distended Musculoskeletal:  No acute deformity or ROM limitaiton Skin:  Grossly  intact  LABS:  CBC  Recent Labs Lab 08/25/15 2041 08/26/15 0254  WBC 11.1* 11.5*  HGB 16.1 14.6  HCT 47.7 43.9  PLT 246 249   Coag's No results for input(s): APTT, INR in the last 168 hours. BMET  Recent Labs Lab 08/25/15 2041 08/26/15 0254  NA 137 139  K 3.7 4.1  CL 105 108  CO2 23 23  BUN 9 9  CREATININE 0.99 1.00  GLUCOSE 81 147*   Electrolytes  Recent Labs Lab 08/25/15 2041 08/26/15 0254  CALCIUM 9.0 8.6*  MG  --  1.7   Sepsis Markers No results for input(s): LATICACIDVEN, PROCALCITON, O2SATVEN in the last 168 hours. ABG No results for input(s): PHART, PCO2ART, PO2ART in the last 168 hours. Liver Enzymes  Recent Labs Lab 08/25/15 2041  AST 16  ALT 13*  ALKPHOS 73  BILITOT 0.6  ALBUMIN 3.8   Cardiac Enzymes No results for input(s): TROPONINI, PROBNP in the last 168  hours. Glucose  Recent Labs Lab 08/25/15 2007  GLUCAP 85    Imaging Ct Head Wo Contrast  08/25/2015   CLINICAL DATA:  Altered level of consciousness.  EXAM: CT HEAD WITHOUT CONTRAST  TECHNIQUE: Contiguous axial images were obtained from the base of the skull through the vertex without intravenous contrast.  COMPARISON:  02/11/2015  FINDINGS: Skull and Sinuses:Chronic sinusitis with sclerosis and extensive opacification of the left maxillary sinus. No fracture or destructive process.  Orbits: No acute abnormality.  Brain: No evidence of acute infarction, hemorrhage, hydrocephalus, or mass lesion/mass effect.  Stable extensive cortical and subcortical gliosis in the left frontal lobe. Patchy cerebral white matter low density throughout the bilateral cerebral hemispheres is stable, gliosis and dilated perivascular spaces based on brain MRI from earlier this year.  IMPRESSION: 1. Stable exam.  No acute finding. 2. Extensive left frontal gliosis and white matter disease.   Electronically Signed   By: Monte Fantasia M.D.   On: 08/25/2015 21:42   Dg Chest Portable 1 View  08/26/2015    CLINICAL DATA:  Initial evaluation for hypertension.  EXAM: PORTABLE CHEST - 1 VIEW  COMPARISON:  Prior radiograph from 07/15/2015.  FINDINGS: There is mild accentuation of the cardiac silhouette related AP technique and shallow lung inflation. Mediastinal silhouettes within normal limits.  The lungs are hypoinflated. Mild secondary diffuse bronchovascular crowding. No airspace consolidation, pleural effusion, or pulmonary edema is identified. There is no pneumothorax.  No acute osseous abnormality identified.  IMPRESSION: Shallow lung inflation with secondary bibasilar bronchovascular crowding. No other active cardiopulmonary disease.   Electronically Signed   By: Jeannine Boga M.D.   On: 08/26/2015 00:00     ASSESSMENT / PLAN:  NEUROLOGIC A:   ETOH withdrawal Delirium tremens  H/o seizures ?Hypertensive encephalopathy/PRES Polysubstance abuse, ETOH, cocaine (12/2014).  P:   RASS goal: 0 to -1 Precedex infusion ICU CIWA protocol Ativan and haldol PRN for breakthrough agitation Monitor QtC with Haldol MRI brian if encephalopathy does not improve Continue Keppra  PULMONARY A: H/o asthma with no acute exacerbation Tobacco abuse disorder  P:   Monitor  CARDIOVASCULAR A:  Hypertensive Crisis H/o HTN   P:  EKG QTc monitoring Continue home amlodipine, clonidine if able to take PO PRN hydralazine, labetalol  RENAL A:   No acute issues  P:   Follow Bmet  GASTROINTESTINAL A:   No acute issues  P:   Protonix for SUP Heart heathy diet if agitation subsides  HEMATOLOGIC A:   DVT on xarelto  P:  Follow CBC SCDs Continue xarelto  INFECTIOUS A:   No acute issues  P:   Monitor  ENDOCRINE A:   No acute issues  P:   Monitor    FAMILY  - Updates: no family at bedside  - Inter-disciplinary family meet or Palliative Care meeting due by: 09/02/2015    Georgann Housekeeper, AGACNP-BC [facility] Pager [phone] or [phone]  08/26/2015 5:19 PM

## 2015-08-26 NOTE — H&P (Signed)
[facility] Admission History and Physical       Richard [name] NGE:[id number] DOB: 10/21/1971 DOA: 08/25/2015  Referring physician: EDP PCP: [name], MD  Specialists:   Chief Complaint: Passed Out  HPI: Richard [name] is a 44 y.o. male with history of Malignant HTN, Seizures, LLE DVT on Xarelto Rx who presents to the ED with complaints of passing out in the Afternoon.  He reports that he did not feel well and his blood pressures were elevated and he tried to get his blood pressure down, but it was not working. He complained of having a headache at that time.  And, he reportedly was confused and He passed out at home and his wife called EMS.     Review of Systems:  Constitutional: No Weight Loss, No Weight Gain, Night Sweats, Fevers, Chills, Dizziness, Light Headedness, Fatigue, or Generalized Weakness HEENT: No Headaches, Difficulty Swallowing,Tooth/Dental Problems,Sore Throat,  No Sneezing, Rhinitis, Ear Ache, Nasal Congestion, or Post Nasal Drip,  Cardio-vascular:  No Chest pain, Orthopnea, PND, Edema in Lower Extremities, Anasarca, Dizziness, Palpitations  Resp: No Dyspnea, No DOE, No Productive Cough, No Non-Productive Cough, No Hemoptysis, No Wheezing.    GI: No Heartburn, Indigestion, Abdominal Pain, Nausea, Vomiting, Diarrhea, Constipation, Hematemesis, Hematochezia, Melena, Change in Bowel Habits,  Loss of Appetite  GU: No Dysuria, No Change in Color of Urine, No Urgency or Urinary Frequency, No Flank pain.  Musculoskeletal: No Joint Pain or Swelling, No Decreased Range of Motion, No Back Pain.  Neurologic:o +Syncope, No Seizures, Muscle Weakness, Paresthesia, Vision Disturbance or Loss, No Diplopia, No Vertigo, No Difficulty Walking,  Skin: No Rash or Lesions. Psych: No Change in Mood or Affect, No Depression or Anxiety, No Memory loss, No Confusion, or Hallucinations   Past Medical History  Diagnosis Date  . Hypertension   . Seizures     last episode 03/2013  .  Asthma   . Brain bleed      Past Surgical History  Procedure Laterality Date  . Leg surgery        Prior to Admission medications   Medication Sig Start Date End Date Taking? Authorizing Provider  amLODipine (NORVASC) 10 MG tablet Take 1 tablet (10 mg total) by mouth daily. 03/20/14  Yes Charlynne Cousins, MD  Aspirin-Acetaminophen-Caffeine (GOODY HEADACHE PO) Take 1 Package by mouth daily as needed (for pain).   Yes Historical Provider, MD  atenolol (TENORMIN) 50 MG tablet Take 1 tablet (50 mg total) by mouth daily. 03/20/14  Yes Charlynne Cousins, MD  clonazePAM (KLONOPIN) 0.5 MG tablet Take 1 tablet (0.5 mg total) by mouth 2 (two) times daily. 03/01/15  Yes Reyne Dumas, MD  cloNIDine (CATAPRES) 0.1 MG tablet Take 0.1 mg by mouth 2 (two) times daily. 11/23/14  Yes Historical Provider, MD  folic acid (FOLVITE) 1 MG tablet Take 1 tablet (1 mg total) by mouth daily. 03/01/15  Yes Reyne Dumas, MD  furosemide (LASIX) 40 MG tablet Take 1 tablet (40 mg total) by mouth daily. 03/01/15  Yes Reyne Dumas, MD  hydrALAZINE (APRESOLINE) 50 MG tablet Take 1 tablet (50 mg total) by mouth every 8 (eight) hours. 03/20/14  Yes Charlynne Cousins, MD  levETIRAcetam (KEPPRA) 500 MG tablet Take 1 tablet (500 mg total) by mouth 2 (two) times daily. 03/01/15  Yes Reyne Dumas, MD  metoprolol tartrate (LOPRESSOR) 25 MG tablet Take 1 tablet (25 mg total) by mouth 2 (two) times daily. 03/01/15  Yes Reyne Dumas, MD  omeprazole (PRILOSEC) 40 MG  capsule Take 40 mg by mouth daily. 01/22/15  Yes Historical Provider, MD  QUEtiapine (SEROQUEL) 50 MG tablet Take 1 tablet (50 mg total) by mouth 2 (two) times daily. 03/01/15  Yes Reyne Dumas, MD  rivaroxaban (XARELTO) 20 MG TABS tablet Take 1 tablet (20 mg total) by mouth daily with supper. 03/22/15 09/01/15 Yes Reyne Dumas, MD  thiamine 100 MG tablet Take 1 tablet (100 mg total) by mouth daily. 03/01/15  Yes Reyne Dumas, MD  amoxicillin-clavulanate (AUGMENTIN) 875-125 MG per  tablet Take 1 tablet by mouth every 12 (twelve) hours. Patient not taking: Reported on 08/25/2015 07/15/15   Leo Grosser, MD  nicotine (NICODERM CQ - DOSED IN MG/24 HOURS) 21 mg/24hr patch Place 1 patch (21 mg total) onto the skin daily. Patient not taking: Reported on 08/25/2015 03/01/15   Reyne Dumas, MD  Rivaroxaban (XARELTO) 15 MG TABS tablet Take 1 tablet (15 mg total) by mouth 2 (two) times daily with a meal. 03/01/15 03/22/15  Reyne Dumas, MD     Allergies  Allergen Reactions  . Dilaudid [Hydromorphone Hcl] Other (See Comments)    Bradycardia and Nausea, hyperthermia    Social History:  reports that he has been smoking Cigarettes.  He has a 26 pack-year smoking history. He has never used smokeless tobacco. He reports that he drinks about 2.4 oz of alcohol per week. He reports that he uses illicit drugs (Cocaine).    Family History  Problem Relation Age of Onset  . Diabetes Mellitus II Sister   . Cancer Father        Physical Exam:  GEN:  Pleasant Well Nourished and Well Developed 44 y.o. African American male examined and in no acute distress; cooperative with exam Filed Vitals:   08/25/15 2300 08/25/15 2313 08/25/15 2330 08/25/15 2345  BP: 167/105 182/116 143/92 142/82  Pulse: 69 86 88 83  Temp:      TempSrc:      Resp: '20 14 19 18  '$ SpO2: 93% 95% 94% 94%   Blood pressure 142/82, pulse 83, temperature 97.9 F (36.6 C), temperature source Oral, resp. rate 18, SpO2 94 %. PSYCH: He is alert and oriented x4; does not appear anxious does not appear depressed; affect is normal HEENT: Normocephalic and Atraumatic, Mucous membranes pink; PERRLA; EOM intact; Fundi:  Benign;  No scleral icterus, Nares: Patent, Oropharynx: Clear, Fair Dentition,    Neck:  FROM, No Cervical Lymphadenopathy nor Thyromegaly or Carotid Bruit; No JVD; Breasts:: Not examined CHEST WALL: No tenderness CHEST: Normal respiration, clear to auscultation bilaterally HEART: Regular rate and rhythm; no murmurs  rubs or gallops BACK: No kyphosis or scoliosis; No CVA tenderness ABDOMEN: Positive Bowel Sounds, Soft Non-Tender, No Rebound or Guarding; No Masses, No Organomegaly,  Rectal Exam: Not done EXTREMITIES: No Cyanosis, Clubbing, or Edema; No Ulcerations. Genitalia: not examined PULSES: 2+ and symmetric SKIN: Normal hydration no rash or ulceration CNS:  Alert and Oriented x 4, No Focal Deficits Vascular: pulses palpable throughout    Labs on Admission:  Basic Metabolic Panel:  Recent Labs Lab 08/25/15 2041  NA 137  K 3.7  CL 105  CO2 23  GLUCOSE 81  BUN 9  CREATININE 0.99  CALCIUM 9.0   Liver Function Tests:  Recent Labs Lab 08/25/15 2041  AST 16  ALT 13*  ALKPHOS 73  BILITOT 0.6  PROT 7.0  ALBUMIN 3.8   No results for input(s): LIPASE, AMYLASE in the last 168 hours. No results for input(s): AMMONIA in the last  168 hours. CBC:  Recent Labs Lab 08/25/15 2041  WBC 11.1*  NEUTROABS 8.3*  HGB 16.1  HCT 47.7  MCV 92.8  PLT 246   Cardiac Enzymes: No results for input(s): CKTOTAL, CKMB, CKMBINDEX, TROPONINI in the last 168 hours.  BNP (last 3 results) No results for input(s): BNP in the last 8760 hours.  ProBNP (last 3 results) No results for input(s): PROBNP in the last 8760 hours.  CBG:  Recent Labs Lab 08/25/15 2007  GLUCAP 85    Radiological Exams on Admission: Ct Head Wo Contrast  08/25/2015   CLINICAL DATA:  Altered level of consciousness.  EXAM: CT HEAD WITHOUT CONTRAST  TECHNIQUE: Contiguous axial images were obtained from the base of the skull through the vertex without intravenous contrast.  COMPARISON:  02/11/2015  FINDINGS: Skull and Sinuses:Chronic sinusitis with sclerosis and extensive opacification of the left maxillary sinus. No fracture or destructive process.  Orbits: No acute abnormality.  Brain: No evidence of acute infarction, hemorrhage, hydrocephalus, or mass lesion/mass effect.  Stable extensive cortical and subcortical gliosis in  the left frontal lobe. Patchy cerebral white matter low density throughout the bilateral cerebral hemispheres is stable, gliosis and dilated perivascular spaces based on brain MRI from earlier this year.  IMPRESSION: 1. Stable exam.  No acute finding. 2. Extensive left frontal gliosis and white matter disease.   Electronically Signed   By: Monte Fantasia M.D.   On: 08/25/2015 21:42   Dg Chest Portable 1 View  08/26/2015   CLINICAL DATA:  Initial evaluation for hypertension.  EXAM: PORTABLE CHEST - 1 VIEW  COMPARISON:  Prior radiograph from 07/15/2015.  FINDINGS: There is mild accentuation of the cardiac silhouette related AP technique and shallow lung inflation. Mediastinal silhouettes within normal limits.  The lungs are hypoinflated. Mild secondary diffuse bronchovascular crowding. No airspace consolidation, pleural effusion, or pulmonary edema is identified. There is no pneumothorax.  No acute osseous abnormality identified.  IMPRESSION: Shallow lung inflation with secondary bibasilar bronchovascular crowding. No other active cardiopulmonary disease.   Electronically Signed   By: Jeannine Boga M.D.   On: 08/26/2015 00:00     EKG: Independently reviewed.        Assessment/Plan:   44 y.o. male with  Principal Problem:   1.    Hypertensive urgency- on Numerous Medications for HTN, ? Compliance   Labetalol IV 20 mg X 1 given in ED  ` Clonidine 0.3 mg PO x 1 given in ED   Resume Amlodipine, Clonidine, Hydralazine    Discontinue Metoprolol and Atenolol and     Change to Labetalol 200 mg PO BID   Monitor BPs       Active Problems:   2.    Syncope-  Vs Seizure   Cardiac Monitoring   Neuro checks   EEG in AM        3.    Convulsions   Continue Keppra Rx   PRN IV Ativan for Seizure Activity   EEG in AM     4.    Lower leg DVT (deep venous thromboembolism), acute- Diagnosed 03/22/2015 and has been on Xarelto Rx   Continue Xarelto Rx until 09/21/2015      5.    TOBACCO  ABUSE   Nicotine Patch     6.    DVT Prophylaxis   On Xarelto Rx     7.   Other- ETOH level = 42, Patient reports drinking [employer] Non-Alcoholic Beer 1 bottle, Not consistent so CIWA monitoring  ordered.       Code Status:     FULL CODE  Family Communication:  No Family Present    Disposition Plan:    Inpatient  Status        Time spent:  [facility] Pager [phone]   If [place] Please Contact the Day Rounding Team MD for [facility]  If 7PM-7AM, Please Contact Night-Floor Coverage  [email or web address] Password [email or web address] 08/26/2015, 12:20 AM     ADDENDUM:   Patient was seen and examined on 08/26/2015

## 2015-08-27 ENCOUNTER — Inpatient Hospital Stay (HOSPITAL_COMMUNITY): Payer: Medicare Other

## 2015-08-27 DIAGNOSIS — G40909 Epilepsy, unspecified, not intractable, without status epilepticus: Secondary | ICD-10-CM

## 2015-08-27 DIAGNOSIS — F10231 Alcohol dependence with withdrawal delirium: Principal | ICD-10-CM

## 2015-08-27 LAB — CBC
HEMATOCRIT: 50.9 % (ref 39.0–52.0)
HEMOGLOBIN: 17.2 g/dL — AB (ref 13.0–17.0)
MCH: 31.1 pg (ref 26.0–34.0)
MCHC: 33.8 g/dL (ref 30.0–36.0)
MCV: 92 fL (ref 78.0–100.0)
Platelets: 233 10*3/uL (ref 150–400)
RBC: 5.53 MIL/uL (ref 4.22–5.81)
RDW: 15.3 % (ref 11.5–15.5)
WBC: 10 10*3/uL (ref 4.0–10.5)

## 2015-08-27 LAB — COMPREHENSIVE METABOLIC PANEL
ALBUMIN: 3.8 g/dL (ref 3.5–5.0)
ALK PHOS: 75 U/L (ref 38–126)
ALT: 11 U/L — ABNORMAL LOW (ref 17–63)
ANION GAP: 10 (ref 5–15)
AST: 15 U/L (ref 15–41)
BILIRUBIN TOTAL: 1 mg/dL (ref 0.3–1.2)
BUN: 8 mg/dL (ref 6–20)
CALCIUM: 9.4 mg/dL (ref 8.9–10.3)
CO2: 22 mmol/L (ref 22–32)
Chloride: 109 mmol/L (ref 101–111)
Creatinine, Ser: 0.73 mg/dL (ref 0.61–1.24)
GFR calc Af Amer: >60 mL/min (ref >60)
GFR calc non Af Amer: >60 mL/min (ref >60)
GLUCOSE: 129 mg/dL — AB (ref 65–99)
Potassium: 3.4 mmol/L — ABNORMAL LOW (ref 3.5–5.1)
SODIUM: 141 mmol/L (ref 135–145)
TOTAL PROTEIN: 7.2 g/dL (ref 6.5–8.1)

## 2015-08-27 LAB — GLUCOSE, CAPILLARY
GLUCOSE-CAPILLARY: 149 mg/dL — AB (ref 65–99)
Glucose-Capillary: 122 mg/dL — ABNORMAL HIGH (ref 65–99)
Glucose-Capillary: 133 mg/dL — ABNORMAL HIGH (ref 65–99)

## 2015-08-27 MED ORDER — KCL IN DEXTROSE-NACL 20-5-0.45 MEQ/L-%-% IV SOLN
INTRAVENOUS | Status: DC
  Administered 2015-08-27 – 2015-08-28 (×3): via INTRAVENOUS
  Filled 2015-08-27 (×6): qty 1000

## 2015-08-27 MED ORDER — PANTOPRAZOLE SODIUM 40 MG IV SOLR
40.0000 mg | INTRAVENOUS | Status: DC
  Administered 2015-08-27 – 2015-09-06 (×11): 40 mg via INTRAVENOUS
  Filled 2015-08-27 (×12): qty 40

## 2015-08-27 MED ORDER — DEXMEDETOMIDINE HCL IN NACL 400 MCG/100ML IV SOLN
0.2000 ug/kg/h | INTRAVENOUS | Status: AC
  Administered 2015-08-27: 0.9 ug/kg/h via INTRAVENOUS
  Administered 2015-08-27: 0.7 ug/kg/h via INTRAVENOUS
  Administered 2015-08-28 (×3): 0.9 ug/kg/h via INTRAVENOUS
  Filled 2015-08-27 (×5): qty 100

## 2015-08-27 MED ORDER — LORAZEPAM 2 MG/ML IJ SOLN
1.0000 mg | INTRAMUSCULAR | Status: DC | PRN
  Administered 2015-08-27: 3 mg via INTRAVENOUS
  Administered 2015-08-27 (×2): 2 mg via INTRAVENOUS
  Administered 2015-08-28 – 2015-08-29 (×5): 4 mg via INTRAVENOUS
  Administered 2015-08-29 – 2015-09-02 (×13): 2 mg via INTRAVENOUS
  Administered 2015-09-02 (×2): 4 mg via INTRAVENOUS
  Administered 2015-09-02: 2 mg via INTRAVENOUS
  Administered 2015-09-02: 4 mg via INTRAVENOUS
  Administered 2015-09-03 (×2): 2 mg via INTRAVENOUS
  Administered 2015-09-03: 4 mg via INTRAVENOUS
  Administered 2015-09-04 – 2015-09-06 (×10): 2 mg via INTRAVENOUS
  Filled 2015-08-27: qty 1
  Filled 2015-08-27 (×2): qty 2
  Filled 2015-08-27: qty 1
  Filled 2015-08-27: qty 2
  Filled 2015-08-27 (×3): qty 1
  Filled 2015-08-27 (×2): qty 2
  Filled 2015-08-27 (×9): qty 1
  Filled 2015-08-27: qty 2
  Filled 2015-08-27 (×2): qty 1
  Filled 2015-08-27: qty 2
  Filled 2015-08-27 (×5): qty 1
  Filled 2015-08-27 (×3): qty 2
  Filled 2015-08-27 (×3): qty 1
  Filled 2015-08-27 (×2): qty 2
  Filled 2015-08-27: qty 1

## 2015-08-27 MED ORDER — ENOXAPARIN SODIUM 40 MG/0.4ML ~~LOC~~ SOLN
40.0000 mg | SUBCUTANEOUS | Status: DC
  Administered 2015-08-27 – 2015-09-10 (×15): 40 mg via SUBCUTANEOUS
  Filled 2015-08-27 (×15): qty 0.4

## 2015-08-27 MED ORDER — FOLIC ACID 5 MG/ML IJ SOLN
1.0000 mg | Freq: Every day | INTRAMUSCULAR | Status: DC
  Administered 2015-08-27 – 2015-09-05 (×10): 1 mg via INTRAVENOUS
  Filled 2015-08-27 (×13): qty 0.2

## 2015-08-27 NOTE — Progress Notes (Addendum)
PULMONARY / CRITICAL CARE MEDICINE   Name: Richard [name] MRN: [id number] DOB: 09-Feb-1971    ADMISSION DATE:  08/25/2015 CONSULTATION DATE:  08/26/2015  REFERRING MD :  [facility]  CHIEF COMPLAINT:  Altered mental status  INITIAL PRESENTATION:  44 yo male smoker presented with syncope, HA from HTN emergency (BP 182/116).  Transferred to ICU with ETOH withdrawal.  Hx of HTN, Sz's, Lt leg DVT on xarelto, Cocaine abuse, asthma, remoteTBI.  STUDIES:  9/11 CT head >> Lt frontal gliosis with white matter disease, no acute findings 9/12 EEG >>  SIGNIFICANT EVENTS: 9/11 Admit 9/12 Psych assessment >> involuntary commitment; transfer to ICU for precedex  SUBJECTIVE:  Remains on precedex, 4 point restraints.  VITAL SIGNS: Temp:  [97.6 F (36.4 C)-97.9 F (36.6 C)] 97.7 F (36.5 C) (09/13 0400) Pulse Rate:  [68-104] 73 (09/13 0600) Resp:  [17-30] 25 (09/13 0600) BP: (143-191)/(89-125) 174/109 mmHg (09/13 0600) SpO2:  [88 %-98 %] 95 % (09/13 0600) INTAKE / OUTPUT:  Intake/Output Summary (Last 24 hours) at 08/27/15 0731 Last data filed at 08/27/15 0600  Gross per 24 hour  Intake 588.57 ml  Output   2800 ml  Net -2211.43 ml    PHYSICAL EXAMINATION: General: sedated Neuro:  RASS -2, moans with stimulation, moves all extremities HEENT:  Pupils reactive Cardiovascular:  Regular, no murmur Lungs:  No wheeze Abdomen:  Soft, non tender Musculoskeletal:  No edema Skin:  No rashes  LABS:  CBC  Recent Labs Lab 08/25/15 2041 08/26/15 0254 08/27/15 0240  WBC 11.1* 11.5* 10.0  HGB 16.1 14.6 17.2*  HCT 47.7 43.9 50.9  PLT 246 249 233   BMET  Recent Labs Lab 08/25/15 2041 08/26/15 0254 08/27/15 0240  NA 137 139 141  K 3.7 4.1 3.4*  CL 105 108 109  CO2 '23 23 22  '$ BUN '9 9 8  '$ CREATININE 0.99 1.00 0.73  GLUCOSE 81 147* 129*   Electrolytes  Recent Labs Lab 08/25/15 2041 08/26/15 0254 08/27/15 0240  CALCIUM 9.0 8.6* 9.4  MG  --  1.7  --    Liver Enzymes  Recent  Labs Lab 08/25/15 2041 08/27/15 0240  AST 16 15  ALT 13* 11*  ALKPHOS 73 75  BILITOT 0.6 1.0  ALBUMIN 3.8 3.8   Glucose  Recent Labs Lab 08/25/15 2007 08/26/15 1952  GLUCAP 85 117*    Imaging No results found.   ASSESSMENT / PLAN:  PULMONARY A: Tobacco abuse with hx of asthma. P:   Nicotine patch PRN albuterol Monitor oxygenation  CARDIOVASCULAR A:  Syncope in setting of HTN emergency. P:  PRN labetalol, hydralazine Resume metoprolol, amlodipine, clonidine, hydralazine, lasix when able to swallow pills  RENAL A:   Hypokalemia. Acute urine retention >> 800 ml noted on bladder scan 9/13. P:   Replace electrolytes as needed Add D5 1/2 NS with 20 meq KCL at 75 ml/hr Insert foley  GASTROINTESTINAL A:   Nutrition. Hx of GERD. P:   NPO until mental status improved Continue protonix  HEMATOLOGIC A:   Hx of Lt leg DVT from 02/19/15 during acute hospitalization with prolonged bedrest >> no other reported hx of thromboembolic disease. P:  F/u CBC Hold xarelto >> will need to re-assess whether to resume this as outpt prior to discharge Lovenox for DVT prevention per pharmacy  INFECTIOUS A:   No evidence for infection. P:   Monitor off Abx  ENDOCRINE A:   Hyperglycemia.   P:   Monitor blood sugar >> SSI if  CBG > 180  NEUROLOGIC A:   Acute encephalopathy 2nd to HTN emergency and ETOH withdrawal with delirium tremens. Hx of polysubstance abuse, TBI, seizures. P:   RASS goal 0 to -1 Continue precedex PRN ativan for CIWA > 8 Thiamine, folic acid Continue keppra IV  Hold outpt klonopin, seroquel Involuntary commitment ordered 08/26/15 >> expires on 09/01/15   CC time 32 minutes.  Chesley Mires, MD [facility] Pulmonary/Critical Care 08/27/2015, 7:45 AM Pager:  [phone] After 3pm call: [phone]

## 2015-08-27 NOTE — Progress Notes (Signed)
On AM assessment, noted pt's scrotum is edematous and tender to touch with L testicle larger than R. MD notified, no new orders at this time.   Bobbye Riggs, RN

## 2015-08-27 NOTE — Progress Notes (Signed)
ANTICOAGULATION CONSULT NOTE - Initial Consult  Pharmacy Consult for enoxaparin Indication: VTE prophylaxis  Allergies  Allergen Reactions  . Dilaudid [Hydromorphone Hcl] Other (See Comments)    Bradycardia and Nausea, hyperthermia    Patient Measurements: Height: '5\' 9"'$  (175.3 cm) Weight: 203 lb 7.8 oz (92.3 kg) IBW/kg (Calculated) : 70.7  Vital Signs: Temp: 97.4 F (36.3 C) (09/13 0700) Temp Source: Axillary (09/13 0700) BP: 144/98 mmHg (09/13 0700) Pulse Rate: 81 (09/13 0700)  Labs:  Recent Labs  08/25/15 2041 08/26/15 0254 08/27/15 0240  HGB 16.1 14.6 17.2*  HCT 47.7 43.9 50.9  PLT 246 249 233  CREATININE 0.99 1.00 0.73    Estimated Creatinine Clearance: 132.2 mL/min (by C-G formula based on Cr of 0.73).   Medical History: Past Medical History  Diagnosis Date  . Hypertension   . Seizures     last episode 03/2013  . Asthma   . Brain bleed     Medications:  Prescriptions prior to admission  Medication Sig Dispense Refill Last Dose  . amLODipine (NORVASC) 10 MG tablet Take 1 tablet (10 mg total) by mouth daily. 30 tablet 1 08/25/2015 at Unknown time  . Aspirin-Acetaminophen-Caffeine (GOODY HEADACHE PO) Take 1 Package by mouth daily as needed (for pain).   08/25/2015 at Unknown time  . atenolol (TENORMIN) 50 MG tablet Take 1 tablet (50 mg total) by mouth daily. 30 tablet 3 08/25/2015 at 0730  . clonazePAM (KLONOPIN) 0.5 MG tablet Take 1 tablet (0.5 mg total) by mouth 2 (two) times daily. 30 tablet 0 08/25/2015 at Unknown time  . cloNIDine (CATAPRES) 0.1 MG tablet Take 0.1 mg by mouth 2 (two) times daily.  6 08/25/2015 at Unknown time  . folic acid (FOLVITE) 1 MG tablet Take 1 tablet (1 mg total) by mouth daily. 30 tablet 1 08/25/2015 at Unknown time  . furosemide (LASIX) 40 MG tablet Take 1 tablet (40 mg total) by mouth daily. 30 tablet 1 08/25/2015 at Unknown time  . hydrALAZINE (APRESOLINE) 50 MG tablet Take 1 tablet (50 mg total) by mouth every 8 (eight) hours. 90  tablet 3 08/25/2015 at Unknown time  . levETIRAcetam (KEPPRA) 500 MG tablet Take 1 tablet (500 mg total) by mouth 2 (two) times daily. 60 tablet 1 08/25/2015 at Unknown time  . metoprolol tartrate (LOPRESSOR) 25 MG tablet Take 1 tablet (25 mg total) by mouth 2 (two) times daily. 60 tablet 1 08/25/2015 at 0730  . omeprazole (PRILOSEC) 40 MG capsule Take 40 mg by mouth daily.  6 08/25/2015 at Unknown time  . QUEtiapine (SEROQUEL) 50 MG tablet Take 1 tablet (50 mg total) by mouth 2 (two) times daily. 60 tablet 0 08/25/2015 at Unknown time  . rivaroxaban (XARELTO) 20 MG TABS tablet Take 1 tablet (20 mg total) by mouth daily with supper. 30 tablet 5 08/24/2015 at Unknown time  . thiamine 100 MG tablet Take 1 tablet (100 mg total) by mouth daily. 30 tablet 0 08/25/2015 at Unknown time  . amoxicillin-clavulanate (AUGMENTIN) 875-125 MG per tablet Take 1 tablet by mouth every 12 (twelve) hours. (Patient not taking: Reported on 08/25/2015) 20 tablet 0 Not Taking at Unknown time  . nicotine (NICODERM CQ - DOSED IN MG/24 HOURS) 21 mg/24hr patch Place 1 patch (21 mg total) onto the skin daily. (Patient not taking: Reported on 08/25/2015) 28 patch 0 Not Taking at Unknown time  . Rivaroxaban (XARELTO) 15 MG TABS tablet Take 1 tablet (15 mg total) by mouth 2 (two) times daily with  a meal. 42 tablet 0     Assessment: 44 yo man admitted 08/25/2015 for HTN urgency and syncopal episode. Treated for LLE DVT in March of this year during hospitalization with prolonged bed rest, however does not have any other reported hx thromboembolic disease. Since pt has been on Xarelto past usual 6 mos duration of therapy, pharmacy consulted to switch Xarelto to enoxparin for VTE ppx.  PMH HTN, seizures, LLE DVT on Xarelto, ?EtOH abuse  CBC wnl, no bleeding noted. Xarelto dose refused by pt last night. Last dose > 12h ago. Cr wnl.  Goal of Therapy:  Monitor platelets by anticoagulation protocol: Yes   Plan:  Start enoxaparin 40 mg subcut  qday  Monitor CBC, extremity pain, swelling, or redness    Heloise Ochoa, [place].D. PGY2 Cardiology Pharmacy Resident Pager: [phone] 08/27/2015,9:03 AM

## 2015-08-27 NOTE — Procedures (Signed)
EEG report.  Brief clinical history:  44 year old African-American male with a past medical history of hypertension, seizure disorder, left lower extremity DVT on anticoagulation, presented with syncopal episode. He was also noted to have significantly elevated blood pressures. He was confused. He was hospitalized for further management.  Technique: this is a 17 channel routine scalp EEG performed at the bedside with bipolar and monopolar montages arranged in accordance to the international 10/20 system of electrode placement. One channel was dedicated to EKG recording.  The patient is agitated requiring sedation with precedex throughout the recording. No activating procedures performed.  Description: as the study opens and throughout the entirety of the recording, the is predominant diffuse alpha activity with less frequent intermixed theta that also occurs diffusely. No epileptiform discharges or electrographic seizures noted.  EKG showed sinus rhythm.  Impression: this is a normal sedated EEG.  Please, be aware that a normal EEG does not exclude the possibility of epilepsy.  Clinical correlation is advised.   Dorian Pod, MD [facility]

## 2015-08-27 NOTE — Progress Notes (Signed)
EEG completed; results pending.    

## 2015-08-28 ENCOUNTER — Inpatient Hospital Stay (HOSPITAL_COMMUNITY): Payer: Medicare Other

## 2015-08-28 LAB — CBC
HCT: 53.6 % — ABNORMAL HIGH (ref 39.0–52.0)
HEMOGLOBIN: 17.8 g/dL — AB (ref 13.0–17.0)
MCH: 31.1 pg (ref 26.0–34.0)
MCHC: 33.2 g/dL (ref 30.0–36.0)
MCV: 93.5 fL (ref 78.0–100.0)
Platelets: 241 10*3/uL (ref 150–400)
RBC: 5.73 MIL/uL (ref 4.22–5.81)
RDW: 15.9 % — AB (ref 11.5–15.5)
WBC: 11.9 10*3/uL — AB (ref 4.0–10.5)

## 2015-08-28 LAB — GLUCOSE, CAPILLARY
GLUCOSE-CAPILLARY: 132 mg/dL — AB (ref 65–99)
GLUCOSE-CAPILLARY: 114 mg/dL — AB (ref 65–99)
Glucose-Capillary: 141 mg/dL — ABNORMAL HIGH (ref 65–99)
Glucose-Capillary: 142 mg/dL — ABNORMAL HIGH (ref 65–99)

## 2015-08-28 LAB — MAGNESIUM: MAGNESIUM: 1.7 mg/dL (ref 1.7–2.4)

## 2015-08-28 LAB — BASIC METABOLIC PANEL
ANION GAP: 11 (ref 5–15)
BUN: 9 mg/dL (ref 6–20)
CALCIUM: 9.4 mg/dL (ref 8.9–10.3)
CO2: 19 mmol/L — ABNORMAL LOW (ref 22–32)
CREATININE: 0.74 mg/dL (ref 0.61–1.24)
Chloride: 112 mmol/L — ABNORMAL HIGH (ref 101–111)
GFR calc non Af Amer: >60 mL/min (ref >60)
Glucose, Bld: 131 mg/dL — ABNORMAL HIGH (ref 65–99)
Potassium: 4.2 mmol/L (ref 3.5–5.1)
SODIUM: 142 mmol/L (ref 135–145)

## 2015-08-28 LAB — PHOSPHORUS: PHOSPHORUS: 3.1 mg/dL (ref 2.5–4.6)

## 2015-08-28 MED ORDER — MAGNESIUM SULFATE 2 GM/50ML IV SOLN
2.0000 g | Freq: Once | INTRAVENOUS | Status: AC
  Administered 2015-08-28: 2 g via INTRAVENOUS
  Filled 2015-08-28: qty 50

## 2015-08-28 MED ORDER — DEXMEDETOMIDINE HCL IN NACL 400 MCG/100ML IV SOLN
0.2000 ug/kg/h | INTRAVENOUS | Status: AC
  Administered 2015-08-28: 1.2 ug/kg/h via INTRAVENOUS
  Administered 2015-08-28: 0.9 ug/kg/h via INTRAVENOUS
  Administered 2015-08-29 (×2): 1.2 ug/kg/h via INTRAVENOUS
  Administered 2015-08-29: 1 ug/kg/h via INTRAVENOUS
  Filled 2015-08-28 (×6): qty 100

## 2015-08-28 NOTE — Care Management Important Message (Signed)
Important Message  Patient Details  Name: Richard [name] MRN: [id number] Date of Birth: 06-Nov-1971   Medicare Important Message Given:  Yes-second notification given    Delorse Lek 08/28/2015, 2:07 PM

## 2015-08-28 NOTE — Progress Notes (Signed)
Password changed by wife at this time to "[email or web address]"

## 2015-08-28 NOTE — Progress Notes (Signed)
PULMONARY / CRITICAL CARE MEDICINE   Name: Richard [name] MRN: [id number] DOB: [birth year]    ADMISSION DATE:  08/25/2015 CONSULTATION DATE:  08/26/2015  REFERRING MD :  [facility]  CHIEF COMPLAINT:  Altered mental status  INITIAL PRESENTATION:  44 yo male smoker presented with syncope, HA from HTN emergency (BP 182/116).  Transferred to ICU with ETOH withdrawal.  Hx of HTN, Sz's, Lt leg DVT on xarelto, Cocaine abuse, asthma, remoteTBI.  STUDIES:  9/11 CT head >> Lt frontal gliosis with white matter disease, no acute findings 9/12 EEG >> normal sedated EEG  SIGNIFICANT EVENTS: 9/11 Admit 9/12 Psych assessment >> involuntary commitment; transfer to ICU for precedex  SUBJECTIVE:  Received several doses of ativan since yesterday.  Remains on precedex.  VITAL SIGNS: Temp:  [97.9 F (36.6 C)-98.9 F (37.2 C)] 98 F (36.7 C) (09/14 0745) Pulse Rate:  [72-92] 92 (09/14 1200) Resp:  [15-36] 25 (09/14 1200) BP: (127-186)/(81-119) 170/117 mmHg (09/14 1200) SpO2:  [90 %-96 %] 93 % (09/14 1200) INTAKE / OUTPUT:  Intake/Output Summary (Last 24 hours) at 08/28/15 1207 Last data filed at 08/28/15 1200  Gross per 24 hour  Intake 2749.38 ml  Output   1035 ml  Net 1714.38 ml    PHYSICAL EXAMINATION: General: sedated Neuro:  RASS -1, oriented to person, moves all extremities HEENT:  Pupils reactive Cardiovascular:  Regular, no murmur Lungs:  No wheeze Abdomen:  Soft, non tender GU: scrotal edema, non tender Musculoskeletal:  No edema Skin:  No rashes  LABS:  CBC  Recent Labs Lab 08/26/15 0254 08/27/15 0240 08/28/15 0247  WBC 11.5* 10.0 11.9*  HGB 14.6 17.2* 17.8*  HCT 43.9 50.9 53.6*  PLT 249 233 241   BMET  Recent Labs Lab 08/26/15 0254 08/27/15 0240 08/28/15 0247  NA 139 141 142  K 4.1 3.4* 4.2  CL 108 109 112*  CO2 23 22 19*  BUN '9 8 9  '$ CREATININE 1.00 0.73 0.74  GLUCOSE 147* 129* 131*   Electrolytes  Recent Labs Lab 08/26/15 0254 08/27/15 0240  08/28/15 0247  CALCIUM 8.6* 9.4 9.4  MG 1.7  --  1.7  PHOS  --   --  3.1   Liver Enzymes  Recent Labs Lab 08/25/15 2041 08/27/15 0240  AST 16 15  ALT 13* 11*  ALKPHOS 73 75  BILITOT 0.6 1.0  ALBUMIN 3.8 3.8   Glucose  Recent Labs Lab 08/25/15 2007 08/26/15 1952 08/27/15 0737 08/27/15 1534 08/27/15 1934 08/28/15 0747  GLUCAP 85 117* 122* 149* 133* 141*    Imaging No results found.   ASSESSMENT / PLAN:  PULMONARY A: Tobacco abuse with hx of asthma. P:   Nicotine patch PRN albuterol Monitor oxygenation  CARDIOVASCULAR A:  Syncope in setting of HTN emergency. P:  PRN labetalol, hydralazine Resume metoprolol, amlodipine, clonidine, hydralazine, lasix when able to swallow pills  RENAL A:   Hypokalemia. Acute urine retention >> 800 ml noted on bladder scan 9/13. Scrotal edema P:   Replace electrolytes as needed Added D5 1/2 NS with 20 meq KCL at 75 ml/hr 9/13 Inserted foley 9/13 F/u scrotal u/s when able  GASTROINTESTINAL A:   Nutrition. Hx of GERD. P:   NPO until mental status improved Continue protonix  HEMATOLOGIC A:   Hx of Lt leg DVT from 02/19/15 during acute hospitalization with prolonged bedrest >> no other reported hx of thromboembolic disease. P:  F/u CBC Hold xarelto >> will need to re-assess whether to resume this as outpt  prior to discharge Lovenox for DVT prevention per pharmacy  INFECTIOUS A:   No evidence for infection. P:   Monitor off Abx  ENDOCRINE A:   Hyperglycemia.   P:   Monitor blood sugar >> SSI if CBG > 180  NEUROLOGIC A:   Acute encephalopathy 2nd to HTN emergency and ETOH withdrawal with delirium tremens. Hx of polysubstance abuse, TBI, seizures. P:   RASS goal 0 to -1 Continue precedex PRN ativan for CIWA > 8 Thiamine, folic acid Continue keppra IV  Hold outpt klonopin, seroquel Involuntary commitment ordered 08/26/15 >> expires on 09/01/15   CC time 31 minutes.  Chesley Mires, MD [facility]  Pulmonary/Critical Care 08/28/2015, 12:07 PM Pager:  [phone] After 3pm call: [phone]

## 2015-08-28 NOTE — Progress Notes (Signed)
[place] Progress Note Patient Name: Richard [name] DOB: [birth year] MRN: [id number]   Date of Service  08/28/2015  HPI/Events of Note  Still requires prec  eICU Interventions  Re order     Intervention Category Intermediate Interventions: Communication with other healthcare providers and/or family;Change in mental status - evaluation and management  FEINSTEIN,DANIEL J. 08/28/2015, 7:49 PM

## 2015-08-29 ENCOUNTER — Inpatient Hospital Stay (HOSPITAL_COMMUNITY): Payer: Medicare Other

## 2015-08-29 LAB — BASIC METABOLIC PANEL
ANION GAP: 8 (ref 5–15)
BUN: 8 mg/dL (ref 6–20)
CO2: 21 mmol/L — AB (ref 22–32)
Calcium: 8.7 mg/dL — ABNORMAL LOW (ref 8.9–10.3)
Chloride: 109 mmol/L (ref 101–111)
Creatinine, Ser: 0.82 mg/dL (ref 0.61–1.24)
GLUCOSE: 123 mg/dL — AB (ref 65–99)
POTASSIUM: 4.9 mmol/L (ref 3.5–5.1)
Sodium: 138 mmol/L (ref 135–145)

## 2015-08-29 LAB — GLUCOSE, CAPILLARY
GLUCOSE-CAPILLARY: 132 mg/dL — AB (ref 65–99)
GLUCOSE-CAPILLARY: 113 mg/dL — AB (ref 65–99)
GLUCOSE-CAPILLARY: 112 mg/dL — AB (ref 65–99)
Glucose-Capillary: 135 mg/dL — ABNORMAL HIGH (ref 65–99)
Glucose-Capillary: 124 mg/dL — ABNORMAL HIGH (ref 65–99)
Glucose-Capillary: 124 mg/dL — ABNORMAL HIGH (ref 65–99)

## 2015-08-29 LAB — CBC
HEMATOCRIT: 48.7 % (ref 39.0–52.0)
Hemoglobin: 16.1 g/dL (ref 13.0–17.0)
MCH: 30.8 pg (ref 26.0–34.0)
MCHC: 33.1 g/dL (ref 30.0–36.0)
MCV: 93.1 fL (ref 78.0–100.0)
PLATELETS: 263 10*3/uL (ref 150–400)
RBC: 5.23 MIL/uL (ref 4.22–5.81)
RDW: 15.7 % — ABNORMAL HIGH (ref 11.5–15.5)
WBC: 13.9 10*3/uL — AB (ref 4.0–10.5)

## 2015-08-29 MED ORDER — DEXTROSE-NACL 5-0.45 % IV SOLN
INTRAVENOUS | Status: DC
  Administered 2015-08-29 – 2015-08-30 (×3): via INTRAVENOUS

## 2015-08-29 MED ORDER — DEXMEDETOMIDINE HCL IN NACL 400 MCG/100ML IV SOLN
0.2000 ug/kg/h | INTRAVENOUS | Status: AC
  Administered 2015-08-29: 1 ug/kg/h via INTRAVENOUS
  Administered 2015-08-30: 1.001 ug/kg/h via INTRAVENOUS
  Administered 2015-08-30 (×4): 1 ug/kg/h via INTRAVENOUS
  Filled 2015-08-29 (×6): qty 100

## 2015-08-29 NOTE — Progress Notes (Signed)
PULMONARY / CRITICAL CARE MEDICINE   Name: Richard [name] MRN: [id number] DOB: [birth year]    ADMISSION DATE:  08/25/2015 CONSULTATION DATE:  08/26/2015  REFERRING MD :  Triad  CHIEF COMPLAINT:  Altered mental status  INITIAL PRESENTATION:  44 yo male smoker presented with syncope, HA from HTN emergency (BP 182/116).  Transferred to ICU with ETOH withdrawal.  Hx of HTN, Sz's, Lt leg DVT on xarelto, Cocaine abuse, asthma, remoteTBI.  STUDIES:  9/11 CT head >> Lt frontal gliosis with white matter disease, no acute findings 9/12 EEG >> normal sedated EEG 9/14 scrotal u/s >> b/l hydrocele  SIGNIFICANT EVENTS: 9/11 Admit 9/12 Psych assessment >> involuntary commitment; transfer to ICU for precedex  SUBJECTIVE:  One dose of ativan since yesterday.  Remains on precedex.  VITAL SIGNS: Temp:  [97.8 F (36.6 C)-99.2 F (37.3 C)] 98.9 F (37.2 C) (09/15 0745) Pulse Rate:  [81-107] 92 (09/15 0800) Resp:  [21-37] 28 (09/15 0800) BP: (121-188)/(74-123) 162/104 mmHg (09/15 0800) SpO2:  [90 %-96 %] 93 % (09/15 0800) INTAKE / OUTPUT:  Intake/Output Summary (Last 24 hours) at 08/29/15 0810 Last data filed at 08/29/15 0700  Gross per 24 hour  Intake 2640.77 ml  Output   1255 ml  Net 1385.77 ml    PHYSICAL EXAMINATION: General: sedated Neuro:  RASS -1, oriented to person, moves all extremities HEENT:  Pupils reactive Cardiovascular:  Regular, no murmur Lungs:  Scattered rhonchi Abdomen:  Soft, non tender GU: scrotal swelling, non tender Musculoskeletal:  No edema Skin:  No rashes  LABS:  CBC  Recent Labs Lab 08/27/15 0240 08/28/15 0247 08/29/15 0310  WBC 10.0 11.9* 13.9*  HGB 17.2* 17.8* 16.1  HCT 50.9 53.6* 48.7  PLT 233 241 263   BMET  Recent Labs Lab 08/27/15 0240 08/28/15 0247 08/29/15 0310  NA 141 142 138  K 3.4* 4.2 4.9  CL 109 112* 109  CO2 22 19* 21*  BUN '8 9 8  '$ CREATININE 0.73 0.74 0.82  GLUCOSE 129* 131* 123*   Electrolytes  Recent Labs Lab  08/26/15 0254 08/27/15 0240 08/28/15 0247 08/29/15 0310  CALCIUM 8.6* 9.4 9.4 8.7*  MG 1.7  --  1.7  --   PHOS  --   --  3.1  --    Liver Enzymes  Recent Labs Lab 08/25/15 2041 08/27/15 0240  AST 16 15  ALT 13* 11*  ALKPHOS 73 75  BILITOT 0.6 1.0  ALBUMIN 3.8 3.8   Glucose  Recent Labs Lab 08/28/15 0747 08/28/15 1218 08/28/15 1531 08/28/15 2000 08/29/15 0018 08/29/15 0454  GLUCAP 141* 142* 132* 114* 135* 132*    Imaging US Scrotum  08/29/2015   CLINICAL DATA:  44 year old male with swollen testicles  EXAM: SCROTAL ULTRASOUND  DOPPLER ULTRASOUND OF THE TESTICLES  TECHNIQUE: Complete ultrasound examination of the testicles, epididymis, and other scrotal structures was performed. Color and spectral Doppler ultrasound were also utilized to evaluate blood flow to the testicles.  COMPARISON:  None.  FINDINGS: Right testicle  Measurements: 4.2 x 2.1 x 2.3 cm. No intratesticular mass or microlithiasis visualized.  Left testicle  Measurements: 3.2 x 2.4 x 3.0 cm. No mass or microlithiasis visualized.  Right epididymis: Normal in size and appearance. The right epididymal head measures 14 x 6 mm. A small cystic structure adjacent to the right testicle may represent acute infarct). A 3 mm rounded structure within the right scrotum may represent an scrotal pearl.  Left epididymis:  Not well visualized.  Hydrocele:  Large hydrocele.  Varicocele:  Large hydrocele containing small echogenic debris.  Pulsed Doppler interrogation of both testes demonstrates normal low resistance arterial and venous waveforms bilaterally.  IMPRESSION: Bilateral large hydroceles.  Doppler flow documented to both testes.   Electronically Signed   By: Anner Crete M.D.   On: 08/29/2015 02:28     ASSESSMENT / PLAN:  PULMONARY A: Tobacco abuse with hx of asthma. P:   Nicotine patch PRN albuterol Monitor oxygenation F/u CXR 9/16  CARDIOVASCULAR A:  Syncope in setting of HTN emergency. P:  PRN  labetalol, hydralazine Resume metoprolol, amlodipine, clonidine, hydralazine, lasix when able to swallow pills  RENAL A:   Hypokalemia >> resolved. Acute urine retention >> 800 ml noted on bladder scan 9/13. Scrotal swelling >> b/l hydrocele P:   Replace electrolytes as needed Continue D5 1/2 NS IV fluid >> will d/c KCL from IV fluid Inserted foley 9/13 Will need f/u with urology when more stable  GASTROINTESTINAL A:   Nutrition. Hx of GERD. P:   NPO until mental status improved Might need to consider panda tube for tube feeds if mental status not improved enough to allow for oral feedings Continue protonix  HEMATOLOGIC A:   Hx of Lt leg DVT from 02/19/15 during acute hospitalization with prolonged bedrest >> no other reported hx of thromboembolic disease. P:  F/u CBC Hold xarelto >> will need to re-assess whether to resume this as outpt prior to discharge Lovenox for DVT prevention per pharmacy  INFECTIOUS A:   No evidence for infection. P:   Monitor off Abx  ENDOCRINE A:   Hyperglycemia.   P:   Monitor blood sugar >> SSI if CBG > 180  NEUROLOGIC A:   Acute encephalopathy 2nd to HTN emergency and ETOH withdrawal with delirium tremens. Hx of polysubstance abuse, TBI, seizures. P:   RASS goal 0 to -1 Continue precedex PRN ativan for CIWA > 8 Thiamine, folic acid Continue keppra IV  Hold outpt klonopin, seroquel Involuntary commitment ordered 08/26/15 >> expires on 09/01/15   CC time 34 minutes.  Chesley Mires, MD [facility] 08/29/2015, 8:10 AM Pager:  [phone] After 3pm call: [phone]

## 2015-08-30 ENCOUNTER — Inpatient Hospital Stay (HOSPITAL_COMMUNITY): Payer: Medicare Other

## 2015-08-30 LAB — CBC
HCT: 49.4 % (ref 39.0–52.0)
Hemoglobin: 16.2 g/dL (ref 13.0–17.0)
MCH: 30.8 pg (ref 26.0–34.0)
MCHC: 32.8 g/dL (ref 30.0–36.0)
MCV: 93.9 fL (ref 78.0–100.0)
PLATELETS: 218 10*3/uL (ref 150–400)
RBC: 5.26 MIL/uL (ref 4.22–5.81)
RDW: 15.4 % (ref 11.5–15.5)
WBC: 13.4 10*3/uL — AB (ref 4.0–10.5)

## 2015-08-30 LAB — GLUCOSE, CAPILLARY
GLUCOSE-CAPILLARY: 119 mg/dL — AB (ref 65–99)
GLUCOSE-CAPILLARY: 121 mg/dL — AB (ref 65–99)
GLUCOSE-CAPILLARY: 126 mg/dL — AB (ref 65–99)
Glucose-Capillary: 126 mg/dL — ABNORMAL HIGH (ref 65–99)
Glucose-Capillary: 128 mg/dL — ABNORMAL HIGH (ref 65–99)
Glucose-Capillary: 134 mg/dL — ABNORMAL HIGH (ref 65–99)

## 2015-08-30 LAB — BASIC METABOLIC PANEL
Anion gap: 9 (ref 5–15)
BUN: 9 mg/dL (ref 6–20)
CALCIUM: 9.1 mg/dL (ref 8.9–10.3)
CO2: 24 mmol/L (ref 22–32)
CREATININE: 0.76 mg/dL (ref 0.61–1.24)
Chloride: 107 mmol/L (ref 101–111)
Glucose, Bld: 123 mg/dL — ABNORMAL HIGH (ref 65–99)
Potassium: 4 mmol/L (ref 3.5–5.1)
SODIUM: 140 mmol/L (ref 135–145)

## 2015-08-30 MED ORDER — JEVITY 1.2 CAL PO LIQD
1000.0000 mL | ORAL | Status: DC
  Administered 2015-08-30: 22:00:00
  Filled 2015-08-30 (×6): qty 1000

## 2015-08-30 MED ORDER — VITAL HIGH PROTEIN PO LIQD
1000.0000 mL | ORAL | Status: DC
  Administered 2015-08-30: 1000 mL
  Filled 2015-08-30 (×3): qty 1000

## 2015-08-30 MED ORDER — DEXMEDETOMIDINE HCL IN NACL 200 MCG/50ML IV SOLN
0.2000 ug/kg/h | INTRAVENOUS | Status: DC
  Filled 2015-08-30: qty 50

## 2015-08-30 MED ORDER — SODIUM CHLORIDE 0.9 % IV SOLN
3.0000 g | Freq: Four times a day (QID) | INTRAVENOUS | Status: DC
  Administered 2015-08-30 – 2015-09-06 (×28): 3 g via INTRAVENOUS
  Filled 2015-08-30 (×30): qty 3

## 2015-08-30 MED ORDER — DEXMEDETOMIDINE HCL IN NACL 400 MCG/100ML IV SOLN
0.2000 ug/kg/h | INTRAVENOUS | Status: DC
  Administered 2015-08-30 – 2015-08-31 (×3): 0.7 ug/kg/h via INTRAVENOUS
  Filled 2015-08-30 (×4): qty 100

## 2015-08-30 MED ORDER — PRO-STAT SUGAR FREE PO LIQD
30.0000 mL | Freq: Every day | ORAL | Status: DC
  Administered 2015-08-30 – 2015-08-31 (×2): 30 mL
  Filled 2015-08-30 (×2): qty 30

## 2015-08-30 NOTE — Progress Notes (Addendum)
PULMONARY / CRITICAL CARE MEDICINE   Name: Richard [name] MRN: [id number] DOB: [birth year]    ADMISSION DATE:  08/25/2015 CONSULTATION DATE:  08/26/2015  REFERRING MD :  Triad  CHIEF COMPLAINT:  Altered mental status  INITIAL PRESENTATION:  44 yo male smoker presented with syncope, HA from HTN emergency (BP 182/116).  Transferred to ICU with ETOH withdrawal.  Hx of HTN, Sz's, Lt leg DVT on xarelto, Cocaine abuse, asthma, remoteTBI.  STUDIES:  9/11 CT head >> Lt frontal gliosis with white matter disease, no acute findings 9/12 EEG >> normal sedated EEG 9/14 scrotal u/s >> b/l hydrocele  SIGNIFICANT EVENTS: 9/11 Admit 9/12 Psych assessment >> involuntary commitment; transfer to ICU for precedex  SUBJECTIVE:    Remains on precedex gtt, int agitation on restraints  afebrile  VITAL SIGNS: Temp:  [96.8 F (36 C)-98.6 F (37 C)] 97.7 F (36.5 C) (09/16 0745) Pulse Rate:  [75-95] 78 (09/16 0800) Resp:  [19-34] 19 (09/16 0800) BP: (124-193)/(67-129) 132/91 mmHg (09/16 0800) SpO2:  [90 %-99 %] 94 % (09/16 0800) INTAKE / OUTPUT:  Intake/Output Summary (Last 24 hours) at 08/30/15 0909 Last data filed at 08/30/15 0800  Gross per 24 hour  Intake 2148.9 ml  Output   2300 ml  Net -151.1 ml    PHYSICAL EXAMINATION: General: sedated Neuro:  RASS -1, oriented to person, moves all extremities, int agitation HEENT:  Pupils reactive, poor oral hygiene Cardiovascular:  Regular, no murmur Lungs:  Scattered rhonchi Abdomen:  Soft, non tender GU: scrotal swelling, non tender Musculoskeletal:  No edema Skin:  No rashes  LABS:  CBC  Recent Labs Lab 08/28/15 0247 08/29/15 0310 08/30/15 0313  WBC 11.9* 13.9* 13.4*  HGB 17.8* 16.1 16.2  HCT 53.6* 48.7 49.4  PLT 241 263 218   BMET  Recent Labs Lab 08/28/15 0247 08/29/15 0310 08/30/15 0313  NA 142 138 140  K 4.2 4.9 4.0  CL 112* 109 107  CO2 19* 21* 24  BUN '9 8 9  '$ CREATININE 0.74 0.82 0.76  GLUCOSE 131* 123* 123*    Electrolytes  Recent Labs Lab 08/26/15 0254  08/28/15 0247 08/29/15 0310 08/30/15 0313  CALCIUM 8.6*  < > 9.4 8.7* 9.1  MG 1.7  --  1.7  --   --   PHOS  --   --  3.1  --   --   < > = values in this interval not displayed. Liver Enzymes  Recent Labs Lab 08/25/15 2041 08/27/15 0240  AST 16 15  ALT 13* 11*  ALKPHOS 73 75  BILITOT 0.6 1.0  ALBUMIN 3.8 3.8   Glucose  Recent Labs Lab 08/29/15 1146 08/29/15 1555 08/29/15 2031 08/29/15 2321 08/30/15 0359 08/30/15 0800  GLUCAP 113* 124* 112* 119* 126* 128*    Imaging Dg Chest Port 1 View  08/30/2015   CLINICAL DATA:  Atelectasis.  EXAM: PORTABLE CHEST - 1 VIEW  COMPARISON:  08/25/2015.  FINDINGS: Mediastinum and hilar structures are normal. Mild cardiomegaly. Normal pulmonary vascularity. Bibasilar pulmonary infiltrates are noted consistent pneumonia, right side greater than left. No pleural effusion or pneumothorax .  IMPRESSION: 1. Bilateral lower lobe pulmonary infiltrates consistent with pneumonia, right side greater than left.  2.  Cardiomegaly.  No pulmonary venous congestion.   Electronically Signed   By: Marcello Moores  Register   On: 08/30/2015 07:08     ASSESSMENT / PLAN:  PULMONARY A: Tobacco abuse with hx of asthma. Aspiration pneumonia P:   Nicotine patch PRN albuterol  CARDIOVASCULAR A:  Syncope in setting of HTN emergency. P:  PRN labetalol, hydralazine Resume metoprolol, amlodipine, clonidine, hydralazine, lasix when able to swallow pills  RENAL A:   Hypokalemia >> resolved. Acute urine retention >> 800 ml noted on bladder scan 9/13 -foley Scrotal swelling >> b/l hydrocele P:   Replace electrolytes as needed Continue D5 1/2 NS Will need f/u with urology when more stable  GASTROINTESTINAL A:   Nutrition. Hx of GERD. P:   NPO until mental status improved Place panda tube for tube feeds - fluctuating mental status would not allow for oral feedings Continue protonix  HEMATOLOGIC A:   Hx  of Lt leg DVT from 02/19/15 during acute hospitalization with prolonged bedrest >> no other reported hx of thromboembolic disease. P:  F/u CBC Hold xarelto >> will need to re-assess whether to resume this as outpt prior to discharge Lovenox for DVT prevention per pharmacy  INFECTIOUS A:   Aspiration pna P:   Empiric unasyn 9/16 >>  ENDOCRINE A:   Hyperglycemia.   P:   Monitor blood sugar >> SSI if CBG > 180  NEUROLOGIC A:   Acute encephalopathy 2nd to HTN emergency and ETOH withdrawal with delirium tremens. Hx of polysubstance abuse, TBI, seizures. P:   RASS goal 0 to -1 Continue precedex PRN ativan for CIWA > 8 Thiamine, folic acid Continue keppra IV  Hold outpt klonopin, seroquel - can resume once NG placed  Involuntary commitment ordered 08/26/15 >> expires on 09/01/15   CC time 31 minutes.  Rigoberto Noel MD [phone]  08/30/2015, 9:09 AM

## 2015-08-30 NOTE — Progress Notes (Signed)
Five RN's at bedside to assist with placement of NG tube. Patient agitated/combative towards staff. Patient given PRN ativan and maxed on precedex gtt. Wrist/ankle restraints continued per order. Small bore NG tube placed successfuly. Will continue to monitor.

## 2015-08-30 NOTE — Progress Notes (Addendum)
Initial Nutrition Assessment  DOCUMENTATION CODES:   Obesity unspecified  INTERVENTION:    Initiate TF via NGT with Jevity 1.2 at 25 ml/h, increase by 10 ml every 4 hours to goal rate of 75 ml/h with Prostat 30 ml once daily to provide 2260 kcals, 115 gm protein, 1458 ml free water daily.  NUTRITION DIAGNOSIS:   Inadequate oral intake related to inability to eat, lethargy/confusion as evidenced by NPO status.  GOAL:   Patient will meet greater than or equal to 90% of their needs  MONITOR:   TF tolerance, Weight trends, Labs, Diet advancement  REASON FOR ASSESSMENT:   Consult Enteral/tube feeding initiation and management  ASSESSMENT:   44 yo male smoker presented with syncope, HA from HTN emergency (BP 182/116). Transferred to ICU with ETOH withdrawal. Hx of HTN, Sz's, Lt leg DVT on xarelto, Cocaine abuse, asthma, remoteTBI.  Unable to complete Nutrition-Focused physical exam at this time. Patient with fluctuating mental status, unable to safely take PO's. NGT was placed this AM for TF. Required multiple nurses to hold patient during NGT placement. Received MD Consult for TF initiation and management.   Diet Order:  Diet NPO time specified  Skin:  Reviewed, no issues  Last BM:  unknown  Height:   Ht Readings from Last 1 Encounters:  08/26/15 '5\' 9"'$  (1.753 m)    Weight:   Wt Readings from Last 1 Encounters:  08/26/15 203 lb 7.8 oz (92.3 kg)    Ideal Body Weight:  72.7 kg  BMI:  Body mass index is 30.04 kg/(m^2).  Estimated Nutritional Needs:   Kcal:  2050-2250  Protein:  100-120 gm  Fluid:  2.3 L  EDUCATION NEEDS:   No education needs identified at this time   Molli Barrows, [place], [place], [place] Pager [phone] After Hours Pager [phone]

## 2015-08-30 NOTE — Progress Notes (Signed)
ANTIBIOTIC CONSULT NOTE - INITIAL  Pharmacy Consult for Unasyn  Indication: pneumonia - possible aspiration  Allergies  Allergen Reactions  . Dilaudid [Hydromorphone Hcl] Other (See Comments)    Bradycardia and Nausea, hyperthermia    Patient Measurements: Height: '5\' 9"'$  (175.3 cm) Weight: 203 lb 7.8 oz (92.3 kg) IBW/kg (Calculated) : 70.7   Vital Signs: Temp: 97.7 F (36.5 C) (09/16 0745) Temp Source: Axillary (09/16 0745) BP: 132/91 mmHg (09/16 0800) Pulse Rate: 78 (09/16 0800) Intake/Output from previous day: 09/15 0701 - 09/16 0700 In: 2241.2 [I.V.:2031.2; IV Piggyback:210] Out: 2475 [Urine:2475] Intake/Output from this shift: Total I/O In: 188.1 [I.V.:83.1; IV Piggyback:105] Out: -   Labs:  Recent Labs  08/28/15 0247 08/29/15 0310 08/30/15 0313  WBC 11.9* 13.9* 13.4*  HGB 17.8* 16.1 16.2  PLT 241 263 218  CREATININE 0.74 0.82 0.76   Estimated Creatinine Clearance: 132.2 mL/min (by C-G formula based on Cr of 0.76). No results for input(s): VANCOTROUGH, VANCOPEAK, VANCORANDOM, GENTTROUGH, GENTPEAK, GENTRANDOM, TOBRATROUGH, TOBRAPEAK, TOBRARND, AMIKACINPEAK, AMIKACINTROU, AMIKACIN in the last 72 hours.   Microbiology: Recent Results (from the past 720 hour(s))  MRSA PCR Screening     Status: None   Collection Time: 08/26/15  2:00 AM  Result Value Ref Range Status   MRSA by PCR NEGATIVE NEGATIVE Final    Comment:        The GeneXpert MRSA Assay (FDA approved for NASAL specimens only), is one component of a comprehensive MRSA colonization surveillance program. It is not intended to diagnose MRSA infection nor to guide or monitor treatment for MRSA infections.     Medical History: Past Medical History  Diagnosis Date  . Hypertension   . Seizures     last episode 03/2013  . Asthma   . Brain bleed     Assessment: 43yom admitted after passing out at home.  On precedex drip for ETOH withdrawal.  Chest Xray today B/L LL pna - possible aspiration  will start Unasyn.  Afebrile, WBC elevated 13, Cr stable 0.8.    Plan:  Unasyn 3gm IV q6  Bonnita Nasuti Pharm.D. CPP, BCPS Clinical Pharmacist [phone] 08/30/2015 10:06 AM

## 2015-08-31 LAB — GLUCOSE, CAPILLARY
GLUCOSE-CAPILLARY: 128 mg/dL — AB (ref 65–99)
GLUCOSE-CAPILLARY: 110 mg/dL — AB (ref 65–99)
GLUCOSE-CAPILLARY: 131 mg/dL — AB (ref 65–99)
Glucose-Capillary: 120 mg/dL — ABNORMAL HIGH (ref 65–99)
Glucose-Capillary: 111 mg/dL — ABNORMAL HIGH (ref 65–99)

## 2015-08-31 LAB — BASIC METABOLIC PANEL
Anion gap: 8 (ref 5–15)
BUN: 11 mg/dL (ref 6–20)
CALCIUM: 8.8 mg/dL — AB (ref 8.9–10.3)
CO2: 23 mmol/L (ref 22–32)
Chloride: 107 mmol/L (ref 101–111)
Creatinine, Ser: 0.78 mg/dL (ref 0.61–1.24)
GFR calc Af Amer: >60 mL/min (ref >60)
GLUCOSE: 117 mg/dL — AB (ref 65–99)
POTASSIUM: 4 mmol/L (ref 3.5–5.1)
Sodium: 138 mmol/L (ref 135–145)

## 2015-08-31 LAB — CBC
HEMATOCRIT: 47.7 % (ref 39.0–52.0)
Hemoglobin: 15.7 g/dL (ref 13.0–17.0)
MCH: 30.8 pg (ref 26.0–34.0)
MCHC: 32.9 g/dL (ref 30.0–36.0)
MCV: 93.5 fL (ref 78.0–100.0)
Platelets: 226 10*3/uL (ref 150–400)
RBC: 5.1 MIL/uL (ref 4.22–5.81)
RDW: 15.1 % (ref 11.5–15.5)
WBC: 9.5 10*3/uL (ref 4.0–10.5)

## 2015-08-31 MED ORDER — DEXMEDETOMIDINE HCL IN NACL 400 MCG/100ML IV SOLN
0.4000 ug/kg/h | INTRAVENOUS | Status: DC
  Administered 2015-08-31: 0.7 ug/kg/h via INTRAVENOUS
  Administered 2015-09-01: 0.5 ug/kg/h via INTRAVENOUS
  Administered 2015-09-01: 0.4 ug/kg/h via INTRAVENOUS
  Administered 2015-09-01: 0.6 ug/kg/h via INTRAVENOUS
  Administered 2015-09-01: 0.7 ug/kg/h via INTRAVENOUS
  Administered 2015-09-02: 0.6 ug/kg/h via INTRAVENOUS
  Administered 2015-09-02 – 2015-09-03 (×3): 0.7 ug/kg/h via INTRAVENOUS
  Administered 2015-09-04 (×3): 0.5 ug/kg/h via INTRAVENOUS
  Administered 2015-09-04 – 2015-09-05 (×2): 0.7 ug/kg/h via INTRAVENOUS
  Administered 2015-09-05 (×2): 1.8 ug/kg/h via INTRAVENOUS
  Administered 2015-09-05: 0.4 ug/kg/h via INTRAVENOUS
  Administered 2015-09-05: 1.3 ug/kg/h via INTRAVENOUS
  Administered 2015-09-06: 1.1 ug/kg/h via INTRAVENOUS
  Administered 2015-09-06: 0.5 ug/kg/h via INTRAVENOUS
  Filled 2015-08-31: qty 100
  Filled 2015-08-31: qty 50
  Filled 2015-08-31 (×2): qty 100
  Filled 2015-08-31: qty 50
  Filled 2015-08-31 (×5): qty 100
  Filled 2015-08-31: qty 50
  Filled 2015-08-31 (×7): qty 100
  Filled 2015-08-31: qty 50
  Filled 2015-08-31: qty 100
  Filled 2015-08-31: qty 50
  Filled 2015-08-31 (×2): qty 100

## 2015-08-31 NOTE — Progress Notes (Signed)
PULMONARY / CRITICAL CARE MEDICINE   Name: Richard [name] MRN: [id number] DOB: 1971-11-24    ADMISSION DATE:  08/25/2015 CONSULTATION DATE:  08/26/2015  REFERRING MD :  Triad  CHIEF COMPLAINT:  Altered mental status  INITIAL PRESENTATION:  44 yo male smoker presented with syncope, HA from HTN emergency (BP 182/116).  Transferred to ICU with ETOH withdrawal.  Hx of HTN, Sz's, Lt leg DVT on xarelto, Cocaine abuse, asthma, remoteTBI.  STUDIES:  9/11 CT head >> Lt frontal gliosis with white matter disease, no acute findings 9/12 EEG >> normal sedated EEG 9/14 scrotal u/s >> b/l hydrocele  SIGNIFICANT EVENTS: 9/11 Admit 9/12 Psych assessment >> involuntary commitment; transfer to ICU for precedex  SUBJECTIVE:  precedex gtt Less agitated this am per RN's reports.  Other sedating meds held last pm  VITAL SIGNS: Temp:  [97.4 F (36.3 C)-98.7 F (37.1 C)] 97.9 F (36.6 C) (09/17 0849) Pulse Rate:  [75-94] 89 (09/17 0900) Resp:  [12-30] 19 (09/17 0900) BP: (123-175)/(86-124) 143/95 mmHg (09/17 0900) SpO2:  [89 %-97 %] 95 % (09/17 0900) Weight:  [90.8 kg (200 lb 2.8 oz)-91 kg (200 lb 9.9 oz)] 90.8 kg (200 lb 2.8 oz) (09/17 0457) INTAKE / OUTPUT:  Intake/Output Summary (Last 24 hours) at 08/31/15 1002 Last data filed at 08/31/15 0900  Gross per 24 hour  Intake 5054.9 ml  Output   1635 ml  Net 3419.9 ml    PHYSICAL EXAMINATION: General: sedated Neuro:  RASS -1, slow to respond but answers questions, still some outbursts HEENT:  Pupils reactive, poor oral hygiene Cardiovascular:  Regular, no murmur Lungs:  Scattered rhonchi Abdomen:  Soft, non tender GU: scrotal swelling, non tender Musculoskeletal:  No edema Skin:  No rashes  LABS:  CBC  Recent Labs Lab 08/29/15 0310 08/30/15 0313 08/31/15 0436  WBC 13.9* 13.4* 9.5  HGB 16.1 16.2 15.7  HCT 48.7 49.4 47.7  PLT 263 218 226   BMET  Recent Labs Lab 08/29/15 0310 08/30/15 0313 08/31/15 0436  NA 138 140 138   K 4.9 4.0 4.0  CL 109 107 107  CO2 21* 24 23  BUN '8 9 11  '$ CREATININE 0.82 0.76 0.78  GLUCOSE 123* 123* 117*   Electrolytes  Recent Labs Lab 08/26/15 0254  08/28/15 0247 08/29/15 0310 08/30/15 0313 08/31/15 0436  CALCIUM 8.6*  < > 9.4 8.7* 9.1 8.8*  MG 1.7  --  1.7  --   --   --   PHOS  --   --  3.1  --   --   --   < > = values in this interval not displayed. Liver Enzymes  Recent Labs Lab 08/25/15 2041 08/27/15 0240  AST 16 15  ALT 13* 11*  ALKPHOS 73 75  BILITOT 0.6 1.0  ALBUMIN 3.8 3.8   Glucose  Recent Labs Lab 08/30/15 0359 08/30/15 0800 08/30/15 1618 08/30/15 1950 08/30/15 2303 08/31/15 0313  GLUCAP 126* 128* 121* 126* 134* 128*    Imaging Dg Abd Portable 1v  08/30/2015   CLINICAL DATA:  Feeding tube placement  EXAM: PORTABLE ABDOMEN - 1 VIEW  COMPARISON:  February 19, 2015  FINDINGS: Feeding tube tip is in the proximal stomach. Bowel gas pattern is unremarkable. Lung bases are clear.  IMPRESSION: Feeding tube tip in proximal stomach. Bowel gas pattern unremarkable.   Electronically Signed   By: Lowella Grip III M.D.   On: 08/30/2015 11:07     ASSESSMENT / PLAN:  PULMONARY A: Tobacco  abuse with hx of asthma. Aspiration pneumonia P:   Nicotine patch PRN albuterol  CARDIOVASCULAR A:  Syncope in setting of HTN emergency. P:  PRN labetalol, hydralazine Resume metoprolol, amlodipine, clonidine, hydralazine, lasix when able to swallow pills  RENAL A:   Hypokalemia >> resolved. Acute urine retention >> 800 ml noted on bladder scan 9/13 -foley Scrotal swelling >> b/l hydrocele P:   Replace electrolytes as needed Continue D5 1/2 NS Will need f/u with urology when more stable  GASTROINTESTINAL A:   Nutrition. Hx of GERD. P:   NPO until mental status improved Panda tube in for tube feeds - fluctuating mental status would not allow for oral feedings Continue protonix  HEMATOLOGIC A:   Hx of Lt leg DVT from 02/19/15 during acute  hospitalization with prolonged bedrest >> no other reported hx of thromboembolic disease. P:  F/u CBC Hold xarelto >> will need to re-assess whether to resume this as outpt prior to discharge Lovenox for DVT prevention per pharmacy  INFECTIOUS A:   Aspiration pna P:   Empiric unasyn 9/16 >>  ENDOCRINE A:   Hyperglycemia.   P:   Monitor blood sugar >> SSI if CBG > 180  NEUROLOGIC A:   Acute encephalopathy 2nd to HTN emergency and ETOH withdrawal with delirium tremens. Hx of polysubstance abuse, TBI, seizures. Hx TBI with baseline cognitive deficits P:   RASS goal 0 to -1 Continue precedex PRN ativan for CIWA > 8 Thiamine, folic acid Continue keppra IV  Hold outpt klonopin, will restart when sedation needs are less labile. Suspect it may contribute to confusion and delirium Involuntary commitment ordered 08/26/15 >> expires on 09/01/15. Will likely need to be renewed   .Independent CC time 35 minutes.    Baltazar Apo, MD, PhD 08/31/2015, 10:08 AM [facility] [phone] or if no answer [phone]

## 2015-08-31 NOTE — Progress Notes (Signed)
eLink Physician-Brief Progress Note Patient Name: Richard [name] DOB: 1971-02-05 MRN: [id number]   Date of Service  08/31/2015  HPI/Events of Note  Elink RN notified Precedex gtt order to expire at 1145pm.  eICU Interventions  Precedex gtt renewed.     Intervention Category Minor Interventions: Communication with other healthcare providers and/or family  Tera Partridge 08/31/2015, 6:31 PM

## 2015-08-31 NOTE — Progress Notes (Signed)
Patient removed his panda feeding tube while restrained. He refuses to allow [place] to replace the tube.  NOtified Dr [name]. Do not replace at this time. Will re-evaulate tomorrow.   Loni Muse, RN

## 2015-09-01 LAB — GLUCOSE, CAPILLARY
GLUCOSE-CAPILLARY: 147 mg/dL — AB (ref 65–99)
GLUCOSE-CAPILLARY: 107 mg/dL — AB (ref 65–99)
Glucose-Capillary: 110 mg/dL — ABNORMAL HIGH (ref 65–99)
Glucose-Capillary: 111 mg/dL — ABNORMAL HIGH (ref 65–99)
Glucose-Capillary: 111 mg/dL — ABNORMAL HIGH (ref 65–99)
Glucose-Capillary: 87 mg/dL (ref 65–99)

## 2015-09-01 NOTE — Progress Notes (Signed)
PULMONARY / CRITICAL CARE MEDICINE   Name: Richard [name] MRN: [id number] DOB: Feb 10, 1971    ADMISSION DATE:  08/25/2015 CONSULTATION DATE:  08/26/2015  REFERRING MD :  Triad  CHIEF COMPLAINT:  Altered mental status  INITIAL PRESENTATION:  44 yo male smoker presented with syncope, HA from HTN emergency (BP 182/116).  Transferred to ICU with ETOH withdrawal.  Hx of HTN, Sz's, Lt leg DVT on xarelto, Cocaine abuse, asthma, remoteTBI.  STUDIES:  9/11 CT head >> Lt frontal gliosis with white matter disease, no acute findings 9/12 EEG >> normal sedated EEG 9/14 scrotal u/s >> b/l hydrocele  SIGNIFICANT EVENTS: 9/11 Admit 9/12 Psych assessment >> involuntary commitment; transfer to ICU for precedex  SUBJECTIVE:  precedex gtt has been weaned to 0.4 Much more calm and directible today  VITAL SIGNS: Temp:  [97.5 F (36.4 C)-98.7 F (37.1 C)] 98.7 F (37.1 C) (09/18 0800) Pulse Rate:  [75-100] 90 (09/18 1000) Resp:  [17-28] 17 (09/18 1000) BP: (137-187)/(79-155) 152/121 mmHg (09/18 1000) SpO2:  [89 %-96 %] 94 % (09/18 1000) Weight:  [91.7 kg (202 lb 2.6 oz)] 91.7 kg (202 lb 2.6 oz) (09/18 0600) INTAKE / OUTPUT:  Intake/Output Summary (Last 24 hours) at 09/01/15 1012 Last data filed at 09/01/15 0946  Gross per 24 hour  Intake 2421.17 ml  Output   3450 ml  Net -1028.83 ml    PHYSICAL EXAMINATION: General: ill appearing man, in bed Neuro:  RASS 0, better, less agitation, oriented to self and place HEENT:  Pupils reactive, poor oral hygiene Cardiovascular:  Regular, no murmur Lungs:  Scattered rhonchi Abdomen:  Soft, non tender GU: scrotal swelling, non tender Musculoskeletal:  No edema Skin:  No rashes  LABS:  CBC  Recent Labs Lab 08/29/15 0310 08/30/15 0313 08/31/15 0436  WBC 13.9* 13.4* 9.5  HGB 16.1 16.2 15.7  HCT 48.7 49.4 47.7  PLT 263 218 226   BMET  Recent Labs Lab 08/29/15 0310 08/30/15 0313 08/31/15 0436  NA 138 140 138  K 4.9 4.0 4.0  CL 109  107 107  CO2 21* 24 23  BUN '8 9 11  '$ CREATININE 0.82 0.76 0.78  GLUCOSE 123* 123* 117*   Electrolytes  Recent Labs Lab 08/26/15 0254  08/28/15 0247 08/29/15 0310 08/30/15 0313 08/31/15 0436  CALCIUM 8.6*  < > 9.4 8.7* 9.1 8.8*  MG 1.7  --  1.7  --   --   --   PHOS  --   --  3.1  --   --   --   < > = values in this interval not displayed. Liver Enzymes  Recent Labs Lab 08/25/15 2041 08/27/15 0240  AST 16 15  ALT 13* 11*  ALKPHOS 73 75  BILITOT 0.6 1.0  ALBUMIN 3.8 3.8   Glucose  Recent Labs Lab 08/31/15 1157 08/31/15 1607 08/31/15 2002 09/01/15 0026 09/01/15 0424 09/01/15 0820  GLUCAP 111* 110* 131* 111* 147* 111*    Imaging No results found.   ASSESSMENT / PLAN:  PULMONARY A: Tobacco abuse with hx of asthma. Aspiration pneumonia P:   Nicotine patch PRN albuterol abx as below   CARDIOVASCULAR A:  Syncope in setting of HTN emergency. P:  PRN labetalol, hydralazine Resume metoprolol, amlodipine, clonidine, hydralazine, lasix when able to swallow pills  RENAL A:   Hypokalemia >> resolved. Acute urine retention >> 800 ml noted on bladder scan 9/13 -foley Scrotal swelling >> b/l hydrocele P:   Replace electrolytes as needed Continue D5 1/2  NS Trial of foley removal when delirium resolved  GASTROINTESTINAL A:   Nutrition. Hx of GERD. P:   Has been NPO, looks like we can try to start some Po's today 9/18 Panda tube out Continue protonix  HEMATOLOGIC A:   Hx of Lt leg DVT from 02/19/15 during acute hospitalization with prolonged bedrest >> no other reported hx of thromboembolic disease. P:  F/u CBC Hold xarelto >> will need to re-assess whether to resume this as outpt prior to discharge, suspect we should defer given his EtOH use Lovenox for DVT prevention per pharmacy  INFECTIOUS A:   Aspiration pna P:   Empiric unasyn 9/16 >>  ENDOCRINE A:   Hyperglycemia.   P:   Monitor blood sugar >> SSI if CBG > 180  NEUROLOGIC A:    Acute encephalopathy 2nd to HTN emergency and ETOH withdrawal with delirium tremens. Hx of polysubstance abuse, TBI, seizures. Hx TBI with baseline cognitive deficits P:   RASS goal 0 Continue precedex, weaning PRN ativan for CIWA > 8 Thiamine, folic acid Continue keppra IV  Hold outpt klonopin, will restart when sedation needs are less labile. Suspect it may contribute to confusion and delirium Involuntary commitment ordered 08/26/15 >> expires on 09/01/15. He is more cooperative now, do not believe we need to renew  Independent CC time 31 minutes  Baltazar Apo, MD, PhD 09/01/2015, 10:12 AM [facility] [phone] or if no answer [phone]

## 2015-09-02 LAB — GLUCOSE, CAPILLARY
GLUCOSE-CAPILLARY: 104 mg/dL — AB (ref 65–99)
GLUCOSE-CAPILLARY: 132 mg/dL — AB (ref 65–99)
GLUCOSE-CAPILLARY: 97 mg/dL (ref 65–99)
Glucose-Capillary: 105 mg/dL — ABNORMAL HIGH (ref 65–99)

## 2015-09-02 MED ORDER — CLONIDINE HCL 0.2 MG/24HR TD PTWK
0.2000 mg | MEDICATED_PATCH | TRANSDERMAL | Status: DC
  Administered 2015-09-02: 0.2 mg via TRANSDERMAL
  Filled 2015-09-02: qty 1

## 2015-09-02 MED ORDER — ENSURE ENLIVE PO LIQD
237.0000 mL | Freq: Two times a day (BID) | ORAL | Status: DC
  Administered 2015-09-03 – 2015-09-09 (×11): 237 mL via ORAL
  Filled 2015-09-02 (×2): qty 237

## 2015-09-02 NOTE — Progress Notes (Signed)
PULMONARY / CRITICAL CARE MEDICINE   Name: Richard [name] MRN: [id number] DOB: [birth year]    ADMISSION DATE:  08/25/2015 CONSULTATION DATE:  08/26/2015  REFERRING MD :  Triad  CHIEF COMPLAINT:  Altered mental status  INITIAL PRESENTATION:  44 yo male smoker presented with syncope, HA from HTN emergency (BP 182/116).  Transferred to ICU with ETOH withdrawal.  Hx of HTN, Sz's, Lt leg DVT on xarelto, Cocaine abuse, asthma, remoteTBI.  STUDIES:  9/11 CT head >> Lt frontal gliosis with white matter disease, no acute findings 9/12 EEG >> normal sedated EEG 9/14 scrotal u/s >> b/l hydrocele  SIGNIFICANT EVENTS: 9/11 Admit 9/12 Psych assessment >> involuntary commitment; transfer to ICU for precedex  SUBJECTIVE:  precedex gtt has been weaned to 0.4 Much more calm and directible today  VITAL SIGNS: Temp:  [97.8 F (36.6 C)-98.6 F (37 C)] 98.3 F (36.8 C) (09/19 0800) Pulse Rate:  [77-99] 88 (09/19 0800) Resp:  [9-28] 28 (09/19 0800) BP: (131-182)/(91-129) 151/99 mmHg (09/19 0800) SpO2:  [87 %-97 %] 95 % (09/19 0800) Weight:  [90.4 kg (199 lb 4.7 oz)] 90.4 kg (199 lb 4.7 oz) (09/19 0433) INTAKE / OUTPUT:  Intake/Output Summary (Last 24 hours) at 09/02/15 0845 Last data filed at 09/02/15 0800  Gross per 24 hour  Intake 1134.48 ml  Output   2325 ml  Net -1190.52 ml    PHYSICAL EXAMINATION: General: ill appearing man, in bed agitated at times. Neuro:  RASS 0, better, less agitation, oriented to self and place. HEENT:  Pupils reactive, poor oral hygiene. Cardiovascular:  Regular, no murmur. Lungs:  Scattered rhonchi. Abdomen:  Soft, non tender. GU: scrotal swelling, non tender. Musculoskeletal:  No edema. Skin:  No rashes.  LABS:  CBC  Recent Labs Lab 08/29/15 0310 08/30/15 0313 08/31/15 0436  WBC 13.9* 13.4* 9.5  HGB 16.1 16.2 15.7  HCT 48.7 49.4 47.7  PLT 263 218 226   BMET  Recent Labs Lab 08/29/15 0310 08/30/15 0313 08/31/15 0436  NA 138 140 138  K  4.9 4.0 4.0  CL 109 107 107  CO2 21* 24 23  BUN '8 9 11  '$ CREATININE 0.82 0.76 0.78  GLUCOSE 123* 123* 117*   Electrolytes  Recent Labs Lab 08/28/15 0247 08/29/15 0310 08/30/15 0313 08/31/15 0436  CALCIUM 9.4 8.7* 9.1 8.8*  MG 1.7  --   --   --   PHOS 3.1  --   --   --    Liver Enzymes  Recent Labs Lab 08/27/15 0240  AST 15  ALT 11*  ALKPHOS 75  BILITOT 1.0  ALBUMIN 3.8   Glucose  Recent Labs Lab 09/01/15 0820 09/01/15 1227 09/01/15 1636 09/01/15 2010 09/02/15 0024 09/02/15 0828  GLUCAP 111* 87 110* 107* 104* 97   Imaging No results found.  ASSESSMENT / PLAN:  PULMONARY A: Tobacco abuse with hx of asthma. Aspiration pneumonia P:   Nicotine patch PRN albuterol Abx as below   CARDIOVASCULAR A:  Syncope in setting of HTN emergency. P:  PRN labetalol, hydralazine Add clonidine patch for both BP and precedex use. Resume metoprolol, amlodipine, clonidine, hydralazine, lasix when able to swallow pills  RENAL A:   Hypokalemia >> resolved. Acute urine retention >> 800 ml noted on bladder scan 9/13 -foley Scrotal swelling >> b/l hydrocele P:   Replace electrolytes as needed NS at [facility] Trial of foley removal when delirium resolved  GASTROINTESTINAL A:   Nutrition. Hx of GERD. P:   Swallow evaluation today.  Panda tube out. Continue protonix.  HEMATOLOGIC A:   Hx of Lt leg DVT from 02/19/15 during acute hospitalization with prolonged bedrest >> no other reported hx of thromboembolic disease. P:  F/u CBC. Hold xarelto >> will need to re-assess whether to resume this as outpt prior to discharge, suspect we should defer given his EtOH use and high fall risk. Lovenox for DVT prevention per pharmacy.  INFECTIOUS A:   Aspiration pna P:   Empiric unasyn 9/16 >>  ENDOCRINE A:   Hyperglycemia.   P:   Monitor blood sugar >> SSI if CBG > 180  NEUROLOGIC A:   Acute encephalopathy 2nd to HTN emergency and ETOH withdrawal with delirium  tremens. Hx of polysubstance abuse, TBI, seizures. Hx TBI with baseline cognitive deficits P:   RASS goal 0 Continue precedex, weaning PRN ativan for CIWA > 8 Thiamine, folic acid Add clonidine patch Continue keppra IV  Hold outpt klonopin, will restart when sedation needs are less labile. Suspect it may contribute to confusion and delirium Involuntary commitment ordered 08/26/15 >> expires on 09/01/15. He is more cooperative now, do not believe we need to renew  The patient is critically ill with multiple organ systems failure and requires high complexity decision making for assessment and support, frequent evaluation and titration of therapies, application of advanced monitoring technologies and extensive interpretation of multiple databases.   Critical Care Time devoted to patient care services described in this note is  35  Minutes. This time reflects time of care of this signee Dr Jennet Maduro. This critical care time does not reflect procedure time, or teaching time or supervisory time of PA/NP/Med student/Med Resident etc but could involve care discussion time.  Rush Farmer, M.D. [facility] Pulmonary/Critical Care Medicine. Pager: [phone]. After hours pager: [phone].

## 2015-09-02 NOTE — Progress Notes (Signed)
Called by case management, patient is a substance abuser, he is unsteady on his feet and very confused.  He is a danger to self.  This is a note for involuntary commitment for medical reason for patient Richard [name].  Rush Farmer, M.D. [facility] Pulmonary/Critical Care Medicine. Pager: [phone]. After hours pager: [phone].

## 2015-09-02 NOTE — Care Management Important Message (Signed)
Important Message  Patient Details  Name: Richard [name] MRN: [id number] Date of Birth: 01-03-1971   Medicare Important Message Given:  Yes-third notification given    Delorse Lek 09/02/2015, 11:00 AM

## 2015-09-02 NOTE — Progress Notes (Signed)
ANTIBIOTIC CONSULT NOTE - INITIAL  Pharmacy Consult for Unasyn  Indication: pneumonia - possible aspiration  Allergies  Allergen Reactions  . Dilaudid [Hydromorphone Hcl] Other (See Comments)    Bradycardia and Nausea, hyperthermia    Patient Measurements: Height: '5\' 9"'$  (175.3 cm) Weight: 199 lb 4.7 oz (90.4 kg) IBW/kg (Calculated) : 70.7   Vital Signs: Temp: 98 F (36.7 C) (09/19 1126) Temp Source: Oral (09/19 1126) BP: 162/118 mmHg (09/19 1200) Pulse Rate: 94 (09/19 1200) Intake/Output from previous day: 09/18 0701 - 09/19 0700 In: 1169 [I.V.:569; IV Piggyback:600] Out: 2050 [Urine:2050] Intake/Output from this shift: Total I/O In: 608.3 [I.V.:403.3; IV Piggyback:205] Out: 550 [Urine:550]  Labs:  Recent Labs  08/31/15 0436  WBC 9.5  HGB 15.7  PLT 226  CREATININE 0.78   Estimated Creatinine Clearance: 131 mL/min (by C-G formula based on Cr of 0.78). No results for input(s): VANCOTROUGH, VANCOPEAK, VANCORANDOM, GENTTROUGH, GENTPEAK, GENTRANDOM, TOBRATROUGH, TOBRAPEAK, TOBRARND, AMIKACINPEAK, AMIKACINTROU, AMIKACIN in the last 72 hours.   Microbiology: Recent Results (from the past 720 hour(s))  MRSA PCR Screening     Status: None   Collection Time: 08/26/15  2:00 AM  Result Value Ref Range Status   MRSA by PCR NEGATIVE NEGATIVE Final    Comment:        The GeneXpert MRSA Assay (FDA approved for NASAL specimens only), is one component of a comprehensive MRSA colonization surveillance program. It is not intended to diagnose MRSA infection nor to guide or monitor treatment for MRSA infections.     Medical History: Past Medical History  Diagnosis Date  . Hypertension   . Seizures     last episode 03/2013  . Asthma   . Brain bleed     Assessment: 43yom admitted 08/25/15 after passing out at home on abx for possible aspiration pna. BLL infiltrates seen on 9/16 CXR, started on empiric tx with Unasyn.    ID: Day #4 abx with Unasyn. Afebrile, WBC wnl,  Cr stable.   9/16 Unasyn >>  MRSA screen neg No cx drawn  Plan:  Continue Unasyn 3gm IV q6h to complete 7 day tx (end 9/22)    Heloise Ochoa, Pharm.D. PGY2 Pharmacy Resident Pager: [phone]  09/02/2015 12:37 PM

## 2015-09-02 NOTE — Progress Notes (Signed)
SLP Cancellation Note  Patient Details Name: Richard [name] MRN: [id number] DOB: 20-Apr-1971   Cancelled treatment:       Reason Eval/Treat Not Completed: Fatigue/lethargy limiting ability to participate. Pt is sedated.    DeBlois, Katherene Ponto 09/02/2015, 2:23 PM

## 2015-09-02 NOTE — Progress Notes (Signed)
Nutrition Follow-up  DOCUMENTATION CODES:   Obesity unspecified  INTERVENTION:   Ensure Enlive po BID, each supplement provides 350 kcal and 20 grams of protein  NUTRITION DIAGNOSIS:   Inadequate oral intake related to lethargy/confusion as evidenced by meal completion < 50%. Ongoing.   GOAL:   Patient will meet greater than or equal to 90% of their needs Not met.   MONITOR:   PO intake, Supplement acceptance, Labs, I & O's  ASSESSMENT:   43 yo male smoker presented with syncope, HA from HTN emergency (BP 182/116). Transferred to ICU with ETOH withdrawal. Hx of HTN, Sz's, Lt leg DVT on xarelto, Cocaine abuse, asthma, remoteTBI.  Medications reviewed and include: folic acid, thiamine  CBG's: 87-110 Pt lethargic but was able to communicate that he wants to go home. Breakfast at bedside untouched. Per RN SLP eval ordered prior to eating. Pt agreeable to oral nutrition supplements.   Diet Order:  Diet Heart Room service appropriate?: Yes; Fluid consistency:: Thin  Skin:  Reviewed, no issues  Last BM:  unknown  Height:   Ht Readings from Last 1 Encounters:  08/26/15 5' 9" (1.753 m)   Weight:   Wt Readings from Last 1 Encounters:  09/02/15 199 lb 4.7 oz (90.4 kg)   Ideal Body Weight:  72.7 kg  BMI:  Body mass index is 29.42 kg/(m^2).  Estimated Nutritional Needs:   Kcal:  2050-2250  Protein:  100-120 gm  Fluid:  2.3 L  EDUCATION NEEDS:   No education needs identified at this time  Heather Pitts RD, LDN, CNSC [phone] Pager [phone] After Hours Pager  

## 2015-09-03 LAB — GLUCOSE, CAPILLARY
GLUCOSE-CAPILLARY: 102 mg/dL — AB (ref 65–99)
GLUCOSE-CAPILLARY: 103 mg/dL — AB (ref 65–99)
GLUCOSE-CAPILLARY: 93 mg/dL (ref 65–99)
Glucose-Capillary: 103 mg/dL — ABNORMAL HIGH (ref 65–99)
Glucose-Capillary: 105 mg/dL — ABNORMAL HIGH (ref 65–99)
Glucose-Capillary: 97 mg/dL (ref 65–99)

## 2015-09-03 LAB — CBC
HEMATOCRIT: 48.3 % (ref 39.0–52.0)
Hemoglobin: 15.9 g/dL (ref 13.0–17.0)
MCH: 30.9 pg (ref 26.0–34.0)
MCHC: 32.9 g/dL (ref 30.0–36.0)
MCV: 93.8 fL (ref 78.0–100.0)
Platelets: 276 10*3/uL (ref 150–400)
RBC: 5.15 MIL/uL (ref 4.22–5.81)
RDW: 14.7 % (ref 11.5–15.5)
WBC: 7.6 10*3/uL (ref 4.0–10.5)

## 2015-09-03 LAB — BASIC METABOLIC PANEL
ANION GAP: 8 (ref 5–15)
BUN: 8 mg/dL (ref 6–20)
CO2: 26 mmol/L (ref 22–32)
Calcium: 9 mg/dL (ref 8.9–10.3)
Chloride: 105 mmol/L (ref 101–111)
Creatinine, Ser: 0.76 mg/dL (ref 0.61–1.24)
GFR calc Af Amer: >60 mL/min (ref >60)
GFR calc non Af Amer: >60 mL/min (ref >60)
GLUCOSE: 107 mg/dL — AB (ref 65–99)
POTASSIUM: 3.9 mmol/L (ref 3.5–5.1)
Sodium: 139 mmol/L (ref 135–145)

## 2015-09-03 LAB — MAGNESIUM: Magnesium: 1.9 mg/dL (ref 1.7–2.4)

## 2015-09-03 LAB — PHOSPHORUS: Phosphorus: 3.4 mg/dL (ref 2.5–4.6)

## 2015-09-03 MED ORDER — LORAZEPAM 2 MG/ML IJ SOLN
INTRAMUSCULAR | Status: AC
  Administered 2015-09-03: 2 mg via INTRAVENOUS
  Filled 2015-09-03: qty 1

## 2015-09-03 MED ORDER — MAGNESIUM SULFATE 2 GM/50ML IV SOLN
2.0000 g | Freq: Once | INTRAVENOUS | Status: AC
  Administered 2015-09-03: 2 g via INTRAVENOUS
  Filled 2015-09-03: qty 50

## 2015-09-03 MED ORDER — QUETIAPINE FUMARATE 25 MG PO TABS
25.0000 mg | ORAL_TABLET | Freq: Three times a day (TID) | ORAL | Status: DC
  Administered 2015-09-03 – 2015-09-05 (×5): 25 mg via ORAL
  Filled 2015-09-03 (×9): qty 1

## 2015-09-03 MED ORDER — CLONIDINE HCL 0.3 MG/24HR TD PTWK
0.3000 mg | MEDICATED_PATCH | TRANSDERMAL | Status: DC
Start: 2015-09-03 — End: 2015-09-10
  Administered 2015-09-03 – 2015-09-10 (×2): 0.3 mg via TRANSDERMAL
  Filled 2015-09-03 (×2): qty 1

## 2015-09-03 NOTE — Evaluation (Addendum)
Clinical/Bedside Swallow Evaluation Patient Details  Name: Richard [name] MRN: [id number] Date of Birth: February 06, 1971  Today's Date: 09/03/2015 Time: SLP Start Time (ACUTE ONLY): 1141 SLP Stop Time (ACUTE ONLY): 1152 SLP Time Calculation (min) (ACUTE ONLY): 11 min  Past Medical History:  Past Medical History  Diagnosis Date  . Hypertension   . Seizures     last episode 03/2013  . Asthma   . Brain bleed    Past Surgical History:  Past Surgical History  Procedure Laterality Date  . Leg surgery     HPI:  44 y.o. male with history of Malignant HTN, Seizures, LLE DVT, asthma, cocaind abuse, brain bleed who presents to the ED with complaints of passing out in the afternoon. CT head Stable exam. No acute finding, extensive left frontal gliosis and white matter disease. CXR on admit 9/11 without active disease and repeat CXR bilateral lower lobe pulmonary infiltrates consistent with pneumonia, right side greater than left. No prior ST notes found.   Assessment / Plan / Recommendation Clinical Impression  Pt awake, confused and somewhat drowsy after Ativan last night. Mild lingual residue cleared with liquid wash. Delayed cough at end of evaluation x 1. Aspiration not suspected although risk increased given decreased awareness and lethargy. Provided pt and wife on small sips and check oral cavity for pocketed food. SLP recommends continue with regular texture diet, thin liquids, pills with applesauce, straws allowed, small sips and assist/supervision with meals due to impulsivity. No follow up needed. Reconsult if difficulties arise.     Aspiration Risk   (mild-mod)    Diet Recommendation Age appropriate regular solids;Thin   Medication Administration: Whole meds with liquid Compensations: Slow rate;Small sips/bites;Check for pocketing    Other  Recommendations Oral Care Recommendations: Oral care BID   Follow Up Recommendations       Frequency and Duration        Pertinent Vitals/Pain  none    SLP Swallow Goals     Swallow Study Prior Functional Status       General Other Pertinent Information: 44 y.o. male with history of Malignant HTN, Seizures, LLE DVT, asthma, cocaine abuse, brain bleed who presents to the ED with complaints of passing out in the afternoon. CT head Stable exam. No acute finding, extensive left frontal gliosis and white matter disease. CXR on admit 9/11 without active disease and repeat CXR bilateral lower lobe pulmonary infiltrates consistent with pneumonia, right side greater than left. No prior ST notes found. Type of Study: Bedside swallow evaluation Previous Swallow Assessment:  (none) Diet Prior to this Study: Regular;Thin liquids Temperature Spikes Noted: No Respiratory Status: Supplemental O2 delivered via (comment) History of Recent Intubation: No Behavior/Cognition: Cooperative;Confused;Requires cueing (awake "foggy" from Ativan last night) Oral Cavity - Dentition:  (2 teeth upper, missing lower posterior) Self-Feeding Abilities: Able to feed self;Needs set up Patient Positioning: Upright in bed Baseline Vocal Quality: Normal Volitional Cough: Strong Volitional Swallow: Able to elicit    Oral/Motor/Sensory Function Overall Oral Motor/Sensory Function: Appears within functional limits for tasks assessed   Ice Chips Ice chips: Not tested   Thin Liquid Thin Liquid: Within functional limits Presentation: Cup;Straw    Nectar Thick Nectar Thick Liquid: Not tested   Honey Thick Honey Thick Liquid: Not tested   Puree Puree: Not tested   Solid   GO    Solid: Impaired Oral Phase Impairments: Impaired mastication Oral Phase Functional Implications: Oral residue Pharyngeal Phase Impairments:  (none)       Richard [name] 09/03/2015,12:05 PM  Richard [name] M.Ed [employer] [phone]

## 2015-09-03 NOTE — Consult Note (Signed)
Richard Psychiatry Consult   Reason for Consult:  Alcohol withdrawal delirium Referring Physician:  Dr. Nelda Marseille Patient Identification: Richard [name] MRN:  [id number] Principal Diagnosis: Acute encephalopathy Diagnosis:   Patient Active Problem List   Diagnosis Date Noted  . Syncope [R55] 08/26/2015  . Acute encephalopathy [G93.40] 08/26/2015  . Seizures [R56.9]   . Acute pulmonary edema [J81.0]   . Lower leg DVT (deep venous thromboembolism), acute [I82.4Z9]   . Protein-calorie malnutrition, severe [E43] 02/19/2015  . Bacteremia due to Staphylococcus [R78.81, B95.8] 02/17/2015  . Alcohol withdrawal delirium [F10.231]   . Acute respiratory failure with hypoxia [J96.01] 02/13/2015  . Acute respiratory failure with hypoxemia [J96.01]   . Altered mental status [R41.82]   . Metabolic acidosis [T24.4]   . Essential hypertension [I10]   . Encephalopathy [G93.40] 02/11/2015  . Alcohol intoxication [F10.129] 02/11/2015  . Lactic acidosis [E87.2] 02/11/2015  . Bleeding from the nose [R04.0] 03/19/2014  . TIA (transient ischemic attack) [G45.9] 12/24/2013  . Weakness [R53.1] 12/23/2013  . Left-sided weakness [M62.89] 12/23/2013  . Malignant hypertension [I10] 12/23/2013  . Seizure disorder [G40.909] 12/23/2013  . Vomiting [R11.10] 06/18/2013  . Chest pain [R07.9] 06/18/2013  . Hypertensive urgency [I10] 10/30/2012  . Migraine variant [G43.809] 10/30/2012  . TOBACCO ABUSE [Z72.0] 01/11/2008  . HYPERTENSION, BENIGN [I10] 11/24/2007  . CHRONIC FRONTAL SINUSITIS [J32.1] 11/24/2007  . Convulsions [R56.9] 11/24/2007  . Headache(784.0) [R51] 11/24/2007    Total Time spent with patient: 45 minutes  Subjective:   Richard [name] is a 44 y.o. male patient admitted with alcohol withdrawal.  HPI:  Richard [name] is a 44 y.o. male seen, chart reviewed for face-to-face psychiatric consultation and evaluation of alcohol withdrawal delirium, agitation since admitted to [facility].  Patient is a poor historian and also has significant poor insight, judgment and impulse. Patient appeared to be paranoid, delusional, and appropriate and misunderstood and getting as stated several times during this evaluation. Patient wife is at bedside could not control his irritability, agitation or combative behaviors. Patient does not meet criteria for capacity to make his own medical decisions or living arrangements at this time. Patient required involuntary commitment to the hospital secondary to noncompliant behavior and agitation and aggressive behavior on arrival. Patient continued to have hypertensive crisis, headaches and status post seizures and syncopal episodes. Patient has no suicidal/homicidal ideation, intention or plans.  Medical history: Patient with history of Malignant HTN, Seizures, LLE DVT on Xarelto Rx who presents to the ED with complaints of passing out in the Afternoon. He reports that he did not feel well and his blood pressures were elevated and he tried to get his blood pressure down, but it was not working. He complained of having a headache at that time. And, he reportedly was confused and He passed out at home and his wife called EMS.   HPI Elements:   Location:  Alcoholic withdrawal delirium. Quality:  Poor. Severity:  Currently receiving preceded therapy. Timing:  Alcohol intoxication and withdrawal. Duration:  Few days. Context:  Psychosocial stressors.  Past Medical History:  Past Medical History  Diagnosis Date  . Hypertension   . Seizures     last episode 03/2013  . Asthma   . Brain bleed     Past Surgical History  Procedure Laterality Date  . Leg surgery     Family History:  Family History  Problem Relation Age of Onset  . Diabetes Mellitus II Sister   . Cancer Father    Social History:  History  Alcohol Use  . 2.4 oz/week  . 4 Cans of beer per week    Comment: drinks heavily. "pt sts he only drinks one beer a day now"     History   Drug Use  . Yes  . Special: Cocaine    Comment: last use several years ago     Social History   Social History  . Marital Status: Married    Spouse Name: Lattie Haw  . Number of Children: 1  . Years of Education: HS   Occupational History  .  Other    n/a   Social History Main Topics  . Smoking status: Current Every Day Smoker -- 1.00 packs/day for 26 years    Types: Cigarettes  . Smokeless tobacco: Never Used  . Alcohol Use: 2.4 oz/week    4 Cans of beer per week     Comment: drinks heavily. "pt sts he only drinks one beer a day now"  . Drug Use: Yes    Special: Cocaine     Comment: last use several years ago   . Sexual Activity: Yes   Other Topics Concern  . None   Social History Narrative   Patient lives at home alone.   Caffeine Use: 4-5 cups daily   Additional Social History:                          Allergies:   Allergies  Allergen Reactions  . Dilaudid [Hydromorphone Hcl] Other (See Comments)    Bradycardia and Nausea, hyperthermia    Labs:  Results for orders placed or performed during the hospital encounter of 08/25/15 (from the past 48 hour(s))  Glucose, capillary     Status: None   Collection Time: 09/01/15 12:27 PM  Result Value Ref Range   Glucose-Capillary 87 65 - 99 mg/dL   Comment 1 Capillary Specimen   Glucose, capillary     Status: Abnormal   Collection Time: 09/01/15  4:36 PM  Result Value Ref Range   Glucose-Capillary 110 (H) 65 - 99 mg/dL   Comment 1 Capillary Specimen   Glucose, capillary     Status: Abnormal   Collection Time: 09/01/15  8:10 PM  Result Value Ref Range   Glucose-Capillary 107 (H) 65 - 99 mg/dL   Comment 1 Capillary Specimen   Glucose, capillary     Status: Abnormal   Collection Time: 09/02/15 12:24 AM  Result Value Ref Range   Glucose-Capillary 104 (H) 65 - 99 mg/dL   Comment 1 Capillary Specimen   Glucose, capillary     Status: None   Collection Time: 09/02/15  8:28 AM  Result Value Ref Range    Glucose-Capillary 97 65 - 99 mg/dL   Comment 1 Capillary Specimen   Glucose, capillary     Status: Abnormal   Collection Time: 09/02/15 11:28 AM  Result Value Ref Range   Glucose-Capillary 105 (H) 65 - 99 mg/dL   Comment 1 Notify RN   Glucose, capillary     Status: None   Collection Time: 09/02/15  3:21 PM  Result Value Ref Range   Glucose-Capillary 97 65 - 99 mg/dL   Comment 1 Capillary Specimen   Glucose, capillary     Status: Abnormal   Collection Time: 09/02/15  8:21 PM  Result Value Ref Range   Glucose-Capillary 103 (H) 65 - 99 mg/dL   Comment 1 Venous Specimen   Glucose, capillary     Status: Abnormal  Collection Time: 09/03/15 12:04 AM  Result Value Ref Range   Glucose-Capillary 105 (H) 65 - 99 mg/dL   Comment 1 Capillary Specimen   Glucose, capillary     Status: Abnormal   Collection Time: 09/03/15  4:16 AM  Result Value Ref Range   Glucose-Capillary 102 (H) 65 - 99 mg/dL   Comment 1 Capillary Specimen   CBC     Status: None   Collection Time: 09/03/15  5:26 AM  Result Value Ref Range   WBC 7.6 4.0 - 10.5 K/uL   RBC 5.15 4.22 - 5.81 MIL/uL   Hemoglobin 15.9 13.0 - 17.0 g/dL   HCT 48.3 39.0 - 52.0 %   MCV 93.8 78.0 - 100.0 fL   MCH 30.9 26.0 - 34.0 pg   MCHC 32.9 30.0 - 36.0 g/dL   RDW 14.7 11.5 - 15.5 %   Platelets 276 150 - 400 K/uL  Basic metabolic panel     Status: Abnormal   Collection Time: 09/03/15  5:26 AM  Result Value Ref Range   Sodium 139 135 - 145 mmol/L   Potassium 3.9 3.5 - 5.1 mmol/L   Chloride 105 101 - 111 mmol/L   CO2 26 22 - 32 mmol/L   Glucose, Bld 107 (H) 65 - 99 mg/dL   BUN 8 6 - 20 mg/dL   Creatinine, Ser 0.76 0.61 - 1.24 mg/dL   Calcium 9.0 8.9 - 10.3 mg/dL   GFR calc non Af Amer >60 >60 mL/min   GFR calc Af Amer >60 >60 mL/min    Comment: (NOTE) The eGFR has been calculated using the CKD EPI equation. This calculation has not been validated in all clinical situations. eGFR's persistently <60 mL/min signify possible Chronic  Kidney Disease.    Anion gap 8 5 - 15  Magnesium     Status: None   Collection Time: 09/03/15  5:26 AM  Result Value Ref Range   Magnesium 1.9 1.7 - 2.4 mg/dL  Phosphorus     Status: None   Collection Time: 09/03/15  5:26 AM  Result Value Ref Range   Phosphorus 3.4 2.5 - 4.6 mg/dL  Glucose, capillary     Status: Abnormal   Collection Time: 09/03/15  7:25 AM  Result Value Ref Range   Glucose-Capillary 103 (H) 65 - 99 mg/dL    Vitals: Blood pressure 154/120, pulse 77, temperature 97.7 F (36.5 C), temperature source Oral, resp. rate 17, height $RemoveBe'5\' 9"'ULMdKxKaO$  (1.753 m), weight 90.1 kg (198 lb 10.2 oz), SpO2 93 %.  Risk to Self: Is patient at risk for suicide?: No Risk to Others:   Prior Inpatient Therapy:   Prior Outpatient Therapy:    Current Facility-Administered Medications  Medication Dose Route Frequency Provider Last Rate Last Dose  . 0.9 %  sodium chloride infusion  250 mL Intravenous PRN Theressa Millard, MD 10 mL/hr at 09/03/15 0700 250 mL at 09/03/15 0700  . Ampicillin-Sulbactam (UNASYN) 3 g in sodium chloride 0.9 % 100 mL IVPB  3 g Intravenous Q6H Chesley Mires, MD   3 g at 09/03/15 1120  . antiseptic oral rinse (CPC / CETYLPYRIDINIUM CHLORIDE 0.05%) solution 7 mL  7 mL Mouth Rinse q12n4p Raylene Miyamoto, MD   7 mL at 09/02/15 1600  . chlorhexidine (PERIDEX) 0.12 % solution 15 mL  15 mL Mouth Rinse BID Raylene Miyamoto, MD   15 mL at 09/03/15 0902  . cloNIDine (CATAPRES - Dosed in mg/24 hr) patch 0.3 mg  0.3  mg Transdermal Q Tue Rush Farmer, MD   0.3 mg at 09/03/15 1120  . dexmedetomidine (PRECEDEX) 400 MCG/100ML (4 mcg/mL) infusion  0.4-0.7 mcg/kg/hr Intravenous Titrated Javier Glazier, MD 11.4 mL/hr at 09/03/15 0900 0.5 mcg/kg/hr at 09/03/15 0900  . dextrose 5 %-0.45 % sodium chloride infusion   Intravenous Continuous Collene Gobble, MD 10 mL/hr at 09/01/15 1037 10 mL/hr at 09/01/15 1037  . enoxaparin (LOVENOX) injection 40 mg  40 mg Subcutaneous Q24H Raylene Miyamoto, MD   40 mg at 09/03/15 7096  . feeding supplement (ENSURE ENLIVE) (ENSURE ENLIVE) liquid 237 mL  237 mL Oral BID BM Asencion Islam, RD   237 mL at 09/02/15 1400  . folic acid injection 1 mg  1 mg Intravenous Daily Chesley Mires, MD   1 mg at 09/03/15 0923  . hydrALAZINE (APRESOLINE) injection 10-40 mg  10-40 mg Intravenous Q4H PRN Rigoberto Noel, MD   20 mg at 09/03/15 0907  . labetalol (NORMODYNE,TRANDATE) injection 10-20 mg  10-20 mg Intravenous Q2H PRN Rigoberto Noel, MD   20 mg at 09/03/15 0536  . levETIRAcetam (KEPPRA) 500 mg in sodium chloride 0.9 % 100 mL IVPB  500 mg Intravenous Q12H Bonnielee Haff, MD   500 mg at 09/03/15 0903  . LORazepam (ATIVAN) injection 1-4 mg  1-4 mg Intravenous Q4H PRN Chesley Mires, MD   4 mg at 09/03/15 0109  . magnesium sulfate IVPB 2 g 50 mL  2 g Intravenous Once Rush Farmer, MD      . nicotine (NICODERM CQ - dosed in mg/24 hours) patch 14 mg  14 mg Transdermal Daily Theressa Millard, MD   14 mg at 09/03/15 0906  . ondansetron (ZOFRAN) injection 4 mg  4 mg Intravenous Q6H PRN Theressa Millard, MD      . pantoprazole (PROTONIX) injection 40 mg  40 mg Intravenous Q24H Chesley Mires, MD   40 mg at 09/03/15 0900  . sodium chloride 0.9 % injection 3 mL  3 mL Intravenous Q12H Theressa Millard, MD   3 mL at 09/03/15 0906  . sodium chloride 0.9 % injection 3 mL  3 mL Intravenous PRN Theressa Millard, MD   3 mL at 09/01/15 1036  . thiamine (B-1) injection 100 mg  100 mg Intravenous Daily Bonnielee Haff, MD   100 mg at 09/03/15 0905    Musculoskeletal: Strength & Muscle Tone: within normal limits Gait & Station: unable to stand Patient leans: N/A  Psychiatric Specialty Exam: Physical Exam as per history and physical  ROS complains of a headache, agitation, uses foul language And paranoid. No Fever-chills, No Headache, No changes with Vision or hearing, reports vertigo No problems swallowing food or Liquids, No Chest pain, Cough or Shortness of  Breath, No Abdominal pain, No Nausea or Vommitting, Bowel movements are regular, No Blood in stool or Urine, No dysuria, No new skin rashes or bruises, No new joints pains-aches,  No new weakness, tingling, numbness in any extremity, No recent weight gain or loss, No polyuria, polydypsia or polyphagia,   A full 10 point Review of Systems was done, except as stated above, all other Review of Systems were negative.  Blood pressure 154/120, pulse 77, temperature 97.7 F (36.5 C), temperature source Oral, resp. rate 17, height $RemoveBe'5\' 9"'lhZYWNFJh$  (1.753 m), weight 90.1 kg (198 lb 10.2 oz), SpO2 93 %.Body mass index is 29.32 kg/(m^2).  General Appearance: Guarded  Eye Contact::  Good  Speech:  Pressured  Volume:  Increased  Mood:  Angry and Irritable  Affect:  Non-Congruent and Labile  Thought Process:  Disorganized, Irrelevant, Loose and Tangential  Orientation:  Full (Time, Place, and Person)  Thought Content:  Rumination  Suicidal Thoughts:  No  Homicidal Thoughts:  No  Memory:  Immediate;   Fair Recent;   Poor  Judgement:  Impaired  Insight:  Lacking  Psychomotor Activity:  Increased and Restlessness  Concentration:  Poor  Recall:  [place] of Knowledge:Fair  Language: Good  Akathisia:  Negative  Handed:  Right  AIMS (if indicated):     Assets:  Agricultural consultant Housing Intimacy Leisure Time Resilience Social Support Talents/Skills Transportation  ADL's:  Impaired  Cognition: Impaired,  Moderate  Sleep:      Medical Decision Making: Review of Psycho-Social Stressors (1), Review or order clinical lab tests (1), Established Problem, Worsening (2), Review of Last Therapy Session (1), Review or order medicine tests (1), Review of Medication Regimen & Side Effects (2) and Review of New Medication or Change in Dosage (2)  Treatment Plan Summary: Daily contact with patient to assess and evaluate symptoms and progress in treatment and Medication  management  Plan:  Case discussed with the psychiatric social service Safety concerns: Continue safety sitter Continue involuntary commitment petition We will give a trial of Seroquel 25 mg 3 times daily Continue alcohol detox treatment with the precedex And treatment of hypertensive crisis Patient does not meet criteria for psychiatric inpatient admission. Supportive therapy provided about ongoing stressors. Appreciate psychiatric consultation Please contact [phone] or [phone] if needs further assistance   Disposition: Patient benefit from substance abuse rehabilitation, residential when medically stable.   JONNALAGADDA,JANARDHAHA R. 09/03/2015 11:26 AM

## 2015-09-03 NOTE — Progress Notes (Signed)
PULMONARY / CRITICAL CARE MEDICINE   Name: Richard [name] MRN: [id number] DOB: 31-Aug-1971    ADMISSION DATE:  08/25/2015 CONSULTATION DATE:  08/26/2015  REFERRING MD :  [facility]  CHIEF COMPLAINT:  Altered mental status  INITIAL PRESENTATION:  44 yo male smoker presented with syncope, HA from HTN emergency (BP 182/116).  Transferred to ICU with ETOH withdrawal.  Hx of HTN, Sz's, Lt leg DVT on xarelto, Cocaine abuse, asthma, remoteTBI.  STUDIES:  9/11 CT head >> Lt frontal gliosis with white matter disease, no acute findings 9/12 EEG >> normal sedated EEG 9/14 scrotal u/s >> b/l hydrocele  SIGNIFICANT EVENTS: 9/11 Admit 9/12 Psych assessment >> involuntary commitment; transfer to ICU for precedex  SUBJECTIVE:  Combative overnight and precedex restarted.  VITAL SIGNS: Temp:  [97.7 F (36.5 C)-98.5 F (36.9 C)] 97.7 F (36.5 C) (09/20 0722) Pulse Rate:  [71-100] 77 (09/20 1000) Resp:  [12-27] 17 (09/20 1000) BP: (122-208)/(89-123) 154/120 mmHg (09/20 1000) SpO2:  [90 %-96 %] 93 % (09/20 1000) Weight:  [90.1 kg (198 lb 10.2 oz)] 90.1 kg (198 lb 10.2 oz) (09/20 0500) INTAKE / OUTPUT:  Intake/Output Summary (Last 24 hours) at 09/03/15 1020 Last data filed at 09/03/15 1000  Gross per 24 hour  Intake 1514.74 ml  Output   2975 ml  Net -1460.26 ml   PHYSICAL EXAMINATION: General: ill appearing man, in bed agitated at times but now sedate. Neuro:  RASS 0, better, less agitation, oriented to self and place. HEENT:  Pupils reactive, poor oral hygiene. Cardiovascular:  Regular, no murmur. Lungs:  Scattered rhonchi. Abdomen:  Soft, non tender. GU: scrotal swelling, non tender. Musculoskeletal:  No edema. Skin:  No rashes.  LABS:  CBC  Recent Labs Lab 08/30/15 0313 08/31/15 0436 09/03/15 0526  WBC 13.4* 9.5 7.6  HGB 16.2 15.7 15.9  HCT 49.4 47.7 48.3  PLT 218 226 276   BMET  Recent Labs Lab 08/30/15 0313 08/31/15 0436 09/03/15 0526  NA 140 138 139  K 4.0 4.0 3.9   CL 107 107 105  CO2 '24 23 26  '$ BUN '9 11 8  '$ CREATININE 0.76 0.78 0.76  GLUCOSE 123* 117* 107*   Electrolytes  Recent Labs Lab 08/28/15 0247  08/30/15 0313 08/31/15 0436 09/03/15 0526  CALCIUM 9.4  < > 9.1 8.8* 9.0  MG 1.7  --   --   --  1.9  PHOS 3.1  --   --   --  3.4  < > = values in this interval not displayed. Liver Enzymes No results for input(s): AST, ALT, ALKPHOS, BILITOT, ALBUMIN in the last 168 hours. Glucose  Recent Labs Lab 09/02/15 1128 09/02/15 1521 09/02/15 2021 09/03/15 0004 09/03/15 0416 09/03/15 0725  GLUCAP 105* 97 103* 105* 102* 103*   Imaging No results found.  ASSESSMENT / PLAN:  PULMONARY A: Tobacco abuse with hx of asthma. Aspiration pneumonia P:   Nicotine patch. PRN albuterol. Abx as below. Monitor in ICU for airway protection.  CARDIOVASCULAR A:  Syncope in setting of HTN emergency. P:  PRN labetalol, hydralazine Increase clonidine patch to 0.3 for both BP and precedex use. Resume metoprolol, amlodipine, clonidine, hydralazine, lasix when able to swallow pills  RENAL A:   Hypokalemia >> resolved. Acute urine retention >> 800 ml noted on bladder scan 9/13 -foley Scrotal swelling >> b/l hydrocele P:   Replace electrolytes as needed NS at [facility] Trial of foley removal when delirium resolved  GASTROINTESTINAL A:   Nutrition. Hx of GERD.  P:   Swallow evaluation if able to cooperate. Panda tube out. Continue protonix.  HEMATOLOGIC A:   Hx of Lt leg DVT from 02/19/15 during acute hospitalization with prolonged bedrest >> no other reported hx of thromboembolic disease. P:  F/u CBC. Hold xarelto >> will need to re-assess whether to resume this as outpt prior to discharge, suspect we should defer given his EtOH use and high fall risk. Lovenox for DVT prevention per pharmacy.  INFECTIOUS A:   Aspiration pna P:   Empiric unasyn 9/16 >>  ENDOCRINE A:   Hyperglycemia.   P:   Monitor blood sugar >> SSI if CBG >  180  NEUROLOGIC A:   Acute encephalopathy 2nd to HTN emergency and ETOH withdrawal with delirium tremens. Hx of polysubstance abuse, TBI, seizures. Hx TBI with baseline cognitive deficits P:   RASS goal 0 Continue precedex, weaning as able Psych consult called today. PRN ativan for CIWA > 8 Thiamine, folic acid Increase clonidine patch to 0.3 Continue keppra IV  Hold outpt klonopin, will restart when sedation needs are less labile. Suspect it may contribute to confusion and delirium Involuntary commitment ordered 09/02/15 >> expires on 09/08/15.  The patient is critically ill with multiple organ systems failure and requires high complexity decision making for assessment and support, frequent evaluation and titration of therapies, application of advanced monitoring technologies and extensive interpretation of multiple databases.   Critical Care Time devoted to patient care services described in this note is  35  Minutes. This time reflects time of care of this signee Dr Jennet Maduro. This critical care time does not reflect procedure time, or teaching time or supervisory time of PA/NP/Med student/Med Resident etc but could involve care discussion time.  Rush Farmer, M.D. [facility] Pulmonary/Critical Care Medicine. Pager: [phone]. After hours pager: [phone].

## 2015-09-04 LAB — BASIC METABOLIC PANEL
Anion gap: 6 (ref 5–15)
BUN: 11 mg/dL (ref 6–20)
CALCIUM: 8.7 mg/dL — AB (ref 8.9–10.3)
CO2: 28 mmol/L (ref 22–32)
CREATININE: 0.94 mg/dL (ref 0.61–1.24)
Chloride: 103 mmol/L (ref 101–111)
Glucose, Bld: 98 mg/dL (ref 65–99)
Potassium: 4.1 mmol/L (ref 3.5–5.1)
SODIUM: 137 mmol/L (ref 135–145)

## 2015-09-04 LAB — CBC
HCT: 46 % (ref 39.0–52.0)
HEMOGLOBIN: 15.3 g/dL (ref 13.0–17.0)
MCH: 31.2 pg (ref 26.0–34.0)
MCHC: 33.3 g/dL (ref 30.0–36.0)
MCV: 93.7 fL (ref 78.0–100.0)
PLATELETS: 312 10*3/uL (ref 150–400)
RBC: 4.91 MIL/uL (ref 4.22–5.81)
RDW: 14.7 % (ref 11.5–15.5)
WBC: 8.1 10*3/uL (ref 4.0–10.5)

## 2015-09-04 LAB — GLUCOSE, CAPILLARY
GLUCOSE-CAPILLARY: 101 mg/dL — AB (ref 65–99)
Glucose-Capillary: 108 mg/dL — ABNORMAL HIGH (ref 65–99)
Glucose-Capillary: 111 mg/dL — ABNORMAL HIGH (ref 65–99)

## 2015-09-04 LAB — PHOSPHORUS: PHOSPHORUS: 3.6 mg/dL (ref 2.5–4.6)

## 2015-09-04 LAB — MAGNESIUM: MAGNESIUM: 2.1 mg/dL (ref 1.7–2.4)

## 2015-09-04 MED ORDER — METOPROLOL TARTRATE 25 MG PO TABS
25.0000 mg | ORAL_TABLET | Freq: Two times a day (BID) | ORAL | Status: DC
  Administered 2015-09-04 – 2015-09-10 (×8): 25 mg via ORAL
  Filled 2015-09-04 (×14): qty 1

## 2015-09-04 NOTE — Progress Notes (Signed)
Did a modified stroke swallow screen before giving patient PO meds.  Patient was able to cough, lick lips, take fluids from straw with no problems.  Medications administered whole in applesauce.  Lung sounds clear both before and after.

## 2015-09-04 NOTE — Progress Notes (Addendum)
Upon admission, patient was combative and taking swings at staff while being verbally aggressive.  Called E-link to get orders for bilateral wrist restraints.  We titrated patient's precedex gtt up and are utilizing a safety sitter, and at this point, have not needed to put the patient in restraints.  Will continue to monitor and assess.

## 2015-09-04 NOTE — Progress Notes (Signed)
eLink Physician-Brief Progress Note Patient Name: Richard [name] DOB: 07-04-1971 MRN: [id number]   Date of Service  09/04/2015  HPI/Events of Note  Patient combative. Taking swings at nursing staff.   eICU Interventions  Will order bilateral wrist restraints.     Intervention Category Major Interventions: Delirium, psychosis, severe agitation - evaluation and management  [name],[name] 09/04/2015, 7:20 PM

## 2015-09-04 NOTE — Progress Notes (Signed)
PULMONARY / CRITICAL CARE MEDICINE   Name: Richard [name] MRN: [id number] DOB: [birth year]    ADMISSION DATE:  08/25/2015 CONSULTATION DATE:  08/26/2015  REFERRING MD :  [facility]  CHIEF COMPLAINT:  Altered mental status  INITIAL PRESENTATION:  44 yo male smoker presented with syncope, HA from HTN emergency (BP 182/116).  Transferred to ICU with ETOH withdrawal.  Hx of HTN, Sz's, Lt leg DVT on xarelto, Cocaine abuse, asthma, remoteTBI.  STUDIES:  9/11 CT head >> Lt frontal gliosis with white matter disease, no acute findings 9/12 EEG >> normal sedated EEG 9/14 scrotal u/s >> b/l hydrocele  SIGNIFICANT EVENTS: 9/11 Admit 9/12 Psych assessment >> involuntary commitment; transfer to ICU for precedex  SUBJECTIVE:  Agitated again overnight, restarted precedex.  VITAL SIGNS: Temp:  [97.7 F (36.5 C)-98.4 F (36.9 C)] 98 F (36.7 C) (09/21 0400) Pulse Rate:  [71-92] 84 (09/21 0600) Resp:  [13-24] 13 (09/21 0600) BP: (132-208)/(80-130) 163/115 mmHg (09/21 0600) SpO2:  [91 %-97 %] 92 % (09/21 0600) Weight:  [89.6 kg (197 lb 8.5 oz)] 89.6 kg (197 lb 8.5 oz) (09/21 0435) INTAKE / OUTPUT:  Intake/Output Summary (Last 24 hours) at 09/04/15 0634 Last data filed at 09/04/15 0600  Gross per 24 hour  Intake 1179.8 ml  Output   1800 ml  Net -620.2 ml   PHYSICAL EXAMINATION: General: ill appearing man, in bed agitated at times but now sedate. Neuro:  RASS 0, better, less agitation, oriented to self and place. HEENT:  Pupils reactive, poor oral hygiene. Cardiovascular:  Regular, no murmur. Lungs:  Scattered rhonchi. Abdomen:  Soft, non tender. GU: scrotal swelling, non tender. Musculoskeletal:  No edema. Skin:  No rashes.  LABS:  CBC  Recent Labs Lab 08/31/15 0436 09/03/15 0526 09/04/15 0215  WBC 9.5 7.6 8.1  HGB 15.7 15.9 15.3  HCT 47.7 48.3 46.0  PLT 226 276 312   BMET  Recent Labs Lab 08/31/15 0436 09/03/15 0526 09/04/15 0215  NA 138 139 137  K 4.0 3.9 4.1  CL  107 105 103  CO2 '23 26 28  '$ BUN '11 8 11  '$ CREATININE 0.78 0.76 0.94  GLUCOSE 117* 107* 98   Electrolytes  Recent Labs Lab 08/31/15 0436 09/03/15 0526 09/04/15 0215  CALCIUM 8.8* 9.0 8.7*  MG  --  1.9 2.1  PHOS  --  3.4 3.6   Liver Enzymes No results for input(s): AST, ALT, ALKPHOS, BILITOT, ALBUMIN in the last 168 hours. Glucose  Recent Labs Lab 09/03/15 0004 09/03/15 0416 09/03/15 0725 09/03/15 2023 09/04/15 0012 09/04/15 0433  GLUCAP 105* 102* 103* 93 111* 108*   Imaging No results found.  ASSESSMENT / PLAN:  PULMONARY A: Tobacco abuse with hx of asthma. Aspiration pneumonia P:   Nicotine patch. PRN albuterol. Abx as below. Monitor in ICU for airway protection.  CARDIOVASCULAR A:  Syncope in setting of HTN emergency. P:  PRN labetalol, hydralazine. Increased clonidine patch to 0.3 for both BP and precedex use. Resume metoprolol 25 mg PO BID with holding parameters. Will consider amlodipine and hydralazine and lasix as BP permits.  RENAL A:   Hypokalemia >> resolved. Acute urine retention >> 800 ml noted on bladder scan 9/13 -foley Scrotal swelling >> b/l hydrocele P:   Replace electrolytes as needed NS at [facility] Trial of foley removal when delirium resolved BMET in AM.  GASTROINTESTINAL A:   Nutrition. Hx of GERD. P:   Swallow evaluation passed, start PO diet per recommendations of SLP. Panda tube out.  Continue protonix.  HEMATOLOGIC A:   Hx of Lt leg DVT from 02/19/15 during acute hospitalization with prolonged bedrest >> no other reported hx of thromboembolic disease. P:  F/u CBC. Hold xarelto >> will need to re-assess whether to resume this as outpt prior to discharge, suspect we should defer given his EtOH use and high fall risk. Lovenox for DVT prevention per pharmacy.  INFECTIOUS A:   Aspiration pna P:   Empiric unasyn 9/16 >>9/23  ENDOCRINE A:   Hyperglycemia.   P:   Monitor blood sugar >> SSI if CBG >  180  NEUROLOGIC A:   Acute encephalopathy 2nd to HTN emergency and ETOH withdrawal with delirium tremens. Hx of polysubstance abuse, TBI, seizures. Hx TBI with baseline cognitive deficits P:   RASS goal 0 Continue precedex, weaning as able Psych consult appreciated, started seroquel 25 TID but not an inpatient candidate. PRN ativan for CIWA > 8 Thiamine, folic acid Increased clonidine patch to 0.3 Continue keppra IV  Hold outpt klonopin, will restart when sedation needs are less labile. Suspect it may contribute to confusion and delirium Involuntary commitment ordered 09/02/15 >> expires on 09/08/15.  The patient is critically ill with multiple organ systems failure and requires high complexity decision making for assessment and support, frequent evaluation and titration of therapies, application of advanced monitoring technologies and extensive interpretation of multiple databases.   Critical Care Time devoted to patient care services described in this note is  35  Minutes. This time reflects time of care of this signee Dr Jennet Maduro. This critical care time does not reflect procedure time, or teaching time or supervisory time of PA/NP/Med student/Med Resident etc but could involve care discussion time.  Rush Farmer, M.D. [facility] Pulmonary/Critical Care Medicine. Pager: [phone]. After hours pager: [phone].
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
## 2015-09-05 DIAGNOSIS — I824Z1 Acute embolism and thrombosis of unspecified deep veins of right distal lower extremity: Secondary | ICD-10-CM

## 2015-09-05 DIAGNOSIS — R5600 Simple febrile convulsions: Secondary | ICD-10-CM

## 2015-09-05 DIAGNOSIS — J9811 Atelectasis: Secondary | ICD-10-CM | POA: Insufficient documentation

## 2015-09-05 LAB — GLUCOSE, CAPILLARY
GLUCOSE-CAPILLARY: 117 mg/dL — AB (ref 65–99)
GLUCOSE-CAPILLARY: 125 mg/dL — AB (ref 65–99)
Glucose-Capillary: 103 mg/dL — ABNORMAL HIGH (ref 65–99)
Glucose-Capillary: 107 mg/dL — ABNORMAL HIGH (ref 65–99)
Glucose-Capillary: 108 mg/dL — ABNORMAL HIGH (ref 65–99)
Glucose-Capillary: 99 mg/dL (ref 65–99)

## 2015-09-05 MED ORDER — LORAZEPAM 1 MG PO TABS
2.0000 mg | ORAL_TABLET | Freq: Four times a day (QID) | ORAL | Status: DC
  Filled 2015-09-05: qty 2

## 2015-09-05 MED ORDER — LORAZEPAM 2 MG/ML IJ SOLN: 1.0000 mg | Freq: Four times a day (QID) | INTRAMUSCULAR | Status: DC

## 2015-09-05 MED ORDER — LORAZEPAM 1 MG PO TABS
1.0000 mg | ORAL_TABLET | Freq: Four times a day (QID) | ORAL | Status: DC
  Administered 2015-09-05: 1 mg via ORAL
  Filled 2015-09-05: qty 1

## 2015-09-05 MED ORDER — QUETIAPINE FUMARATE 100 MG PO TABS
100.0000 mg | ORAL_TABLET | Freq: Two times a day (BID) | ORAL | Status: DC
  Administered 2015-09-06 – 2015-09-07 (×4): 100 mg via ORAL
  Filled 2015-09-05 (×7): qty 1

## 2015-09-05 MED ORDER — QUETIAPINE FUMARATE 50 MG PO TABS
75.0000 mg | ORAL_TABLET | Freq: Once | ORAL | Status: AC
  Administered 2015-09-05: 75 mg via ORAL
  Filled 2015-09-05: qty 1

## 2015-09-05 MED ORDER — ATROPINE SULFATE 0.1 MG/ML IJ SOLN
INTRAMUSCULAR | Status: AC
  Filled 2015-09-05: qty 10

## 2015-09-05 NOTE — Progress Notes (Signed)
Nutrition Follow-up  DOCUMENTATION CODES:   Obesity unspecified  INTERVENTION:    Ensure Enlive PO TID, each supplement provides 350 kcal and 20 grams of protein  NUTRITION DIAGNOSIS:   Inadequate oral intake related to lethargy/confusion as evidenced by meal completion < 50%.  Ongoing  GOAL:   Patient will meet greater than or equal to 90% of their needs  Progressing  MONITOR:   PO intake, Supplement acceptance, Labs, I & O's  ASSESSMENT:   44 yo male smoker presented with syncope, HA from HTN emergency (BP 182/116). Transferred to ICU with ETOH withdrawal. Hx of HTN, Sz's, Lt leg DVT on xarelto, Cocaine abuse, asthma, remoteTBI.  Discussed patient in ICU rounds and with RN today. S/P swallow evaluation with SLP on 9/20, cleared for regular consistency diet with thin liquids. Patient with ongoing poor intake of meals, consuming 10-60% of meals. Per discussion with RN, he does take his Ensure supplements well, will increase to TID.   Diet Order:  Diet Heart Room service appropriate?: Yes; Fluid consistency:: Thin  Skin:  Reviewed, no issues  Last BM:  9/21  Height:   Ht Readings from Last 1 Encounters:  08/26/15 '5\' 9"'$  (1.753 m)    Weight:   Wt Readings from Last 1 Encounters:  09/05/15 203 lb 14.8 oz (92.5 kg)    Ideal Body Weight:  72.7 kg  BMI:  Body mass index is 30.1 kg/(m^2).  Estimated Nutritional Needs:   Kcal:  2050-2250  Protein:  100-120 gm  Fluid:  2.3 L  EDUCATION NEEDS:   No education needs identified at this time  Molli Barrows, [place], [place], [place] Pager [phone] After Hours Pager [phone]

## 2015-09-05 NOTE — Consult Note (Signed)
[facility] Face-to-Face Psychiatry Consult follow-up  Reason for Consult:  Alcohol withdrawal delirium Referring Physician:  Dr. Nelda Marseille Patient Identification: Richard [name] MRN:  [id number] Principal Diagnosis: Acute encephalopathy Diagnosis:   Patient Active Problem List   Diagnosis Date Noted  . Syncope [R55] 08/26/2015  . Acute encephalopathy [G93.40] 08/26/2015  . Seizures [R56.9]   . Acute pulmonary edema [J81.0]   . Lower leg DVT (deep venous thromboembolism), acute [I82.4Z9]   . Protein-calorie malnutrition, severe [E43] 02/19/2015  . Bacteremia due to Staphylococcus [R78.81, B95.8] 02/17/2015  . Alcohol withdrawal delirium [F10.231]   . Acute respiratory failure with hypoxia [J96.01] 02/13/2015  . Acute respiratory failure with hypoxemia [J96.01]   . Altered mental status [R41.82]   . Metabolic acidosis [I71.2]   . Essential hypertension [I10]   . Encephalopathy [G93.40] 02/11/2015  . Alcohol intoxication [F10.129] 02/11/2015  . Lactic acidosis [E87.2] 02/11/2015  . Bleeding from the nose [R04.0] 03/19/2014  . TIA (transient ischemic attack) [G45.9] 12/24/2013  . Weakness [R53.1] 12/23/2013  . Left-sided weakness [M62.89] 12/23/2013  . Malignant hypertension [I10] 12/23/2013  . Seizure disorder [G40.909] 12/23/2013  . Vomiting [R11.10] 06/18/2013  . Chest pain [R07.9] 06/18/2013  . Hypertensive urgency [I10] 10/30/2012  . Migraine variant [G43.809] 10/30/2012  . TOBACCO ABUSE [Z72.0] 01/11/2008  . HYPERTENSION, BENIGN [I10] 11/24/2007  . CHRONIC FRONTAL SINUSITIS [J32.1] 11/24/2007  . Convulsions [R56.9] 11/24/2007  . Headache(784.0) [R51] 11/24/2007    Total Time spent with patient: 20 minutes  Subjective:   Richard [name] is a 44 y.o. male patient admitted with alcohol withdrawal.  HPI:  Richard [name] is a 44 y.o. male seen, chart reviewed for face-to-face psychiatric consultation and evaluation of alcohol withdrawal delirium, agitation since admitted to [facility]. Patient is a poor historian and also has significant poor insight, judgment and impulse. Patient appeared to be paranoid, delusional, and appropriate and misunderstood and getting as stated several times during this evaluation. Patient wife is at bedside could not control his irritability, agitation or combative behaviors. Patient does not meet criteria for capacity to make his own medical decisions or living arrangements at this time. Patient required involuntary commitment to the hospital secondary to noncompliant behavior and agitation and aggressive behavior on arrival. Patient continued to have hypertensive crisis, headaches and status post seizures and syncopal episodes. Patient has no suicidal/homicidal ideation, intention or plans.  Interval history: Richard [name] has been transferred to medical intensive care unit secondary to increased irritability, agitation combative behaviors and required Precedex treatment for all: Withdrawal delirium, paranoid delusions. Reportedly patient has been suffering with traumatic brain injury and status post stroke. Patient also has a hypertensive crisis during this hospitalization. Patient wife is not at bedside during this visit. Patient is not able to contribute information for this evaluation. Psychiatric consultation services will follow up with patient as clinically required.    Past Medical History:  Past Medical History  Diagnosis Date  . Hypertension   . Seizures     last episode 03/2013  . Asthma   . Brain bleed     Past Surgical History  Procedure Laterality Date  . Leg surgery     Family History:  Family History  Problem Relation Age of Onset  . Diabetes Mellitus II Sister   . Cancer Father    Social History:  History  Alcohol Use  . 2.4 oz/week  . 4 Cans of beer per week    Comment: drinks heavily. "pt sts he only drinks one beer a  day now"     History  Drug Use  . Yes  . Special: Cocaine    Comment: last use several  years ago     Social History   Social History  . Marital Status: Married    Spouse Name: Lattie Haw  . Number of Children: 1  . Years of Education: HS   Occupational History  .  Other    n/a   Social History Main Topics  . Smoking status: Current Every Day Smoker -- 1.00 packs/day for 26 years    Types: Cigarettes  . Smokeless tobacco: Never Used  . Alcohol Use: 2.4 oz/week    4 Cans of beer per week     Comment: drinks heavily. "pt sts he only drinks one beer a day now"  . Drug Use: Yes    Special: Cocaine     Comment: last use several years ago   . Sexual Activity: Yes   Other Topics Concern  . None   Social History Narrative   Patient lives at home alone.   Caffeine Use: 4-5 cups daily   Additional Social History:                          Allergies:   Allergies  Allergen Reactions  . Dilaudid [Hydromorphone Hcl] Other (See Comments)    Bradycardia and Nausea, hyperthermia    Labs:  Results for orders placed or performed during the hospital encounter of 08/25/15 (from the past 48 hour(s))  Glucose, capillary     Status: None   Collection Time: 09/03/15  8:23 PM  Result Value Ref Range   Glucose-Capillary 93 65 - 99 mg/dL   Comment 1 Capillary Specimen   Glucose, capillary     Status: Abnormal   Collection Time: 09/04/15 12:12 AM  Result Value Ref Range   Glucose-Capillary 111 (H) 65 - 99 mg/dL  CBC     Status: None   Collection Time: 09/04/15  2:15 AM  Result Value Ref Range   WBC 8.1 4.0 - 10.5 K/uL   RBC 4.91 4.22 - 5.81 MIL/uL   Hemoglobin 15.3 13.0 - 17.0 g/dL   HCT 46.0 39.0 - 52.0 %   MCV 93.7 78.0 - 100.0 fL   MCH 31.2 26.0 - 34.0 pg   MCHC 33.3 30.0 - 36.0 g/dL   RDW 14.7 11.5 - 15.5 %   Platelets 312 150 - 400 K/uL  Basic metabolic panel     Status: Abnormal   Collection Time: 09/04/15  2:15 AM  Result Value Ref Range   Sodium 137 135 - 145 mmol/L   Potassium 4.1 3.5 - 5.1 mmol/L    Comment: SPECIMEN HEMOLYZED. HEMOLYSIS MAY AFFECT  INTEGRITY OF RESULTS.   Chloride 103 101 - 111 mmol/L   CO2 28 22 - 32 mmol/L   Glucose, Bld 98 65 - 99 mg/dL   BUN 11 6 - 20 mg/dL   Creatinine, Ser 0.94 0.61 - 1.24 mg/dL   Calcium 8.7 (L) 8.9 - 10.3 mg/dL   GFR calc non Af Amer >60 >60 mL/min   GFR calc Af Amer >60 >60 mL/min    Comment: (NOTE) The eGFR has been calculated using the CKD EPI equation. This calculation has not been validated in all clinical situations. eGFR's persistently <60 mL/min signify possible Chronic Kidney Disease.    Anion gap 6 5 - 15  Magnesium     Status: None   Collection Time: 09/04/15  2:15 AM  Result Value Ref Range   Magnesium 2.1 1.7 - 2.4 mg/dL  Phosphorus     Status: None   Collection Time: 09/04/15  2:15 AM  Result Value Ref Range   Phosphorus 3.6 2.5 - 4.6 mg/dL  Glucose, capillary     Status: Abnormal   Collection Time: 09/04/15  4:33 AM  Result Value Ref Range   Glucose-Capillary 108 (H) 65 - 99 mg/dL  Glucose, capillary     Status: Abnormal   Collection Time: 09/04/15  8:12 PM  Result Value Ref Range   Glucose-Capillary 101 (H) 65 - 99 mg/dL  Glucose, capillary     Status: None   Collection Time: 09/04/15 11:47 PM  Result Value Ref Range   Glucose-Capillary 99 65 - 99 mg/dL  Glucose, capillary     Status: Abnormal   Collection Time: 09/05/15  3:48 AM  Result Value Ref Range   Glucose-Capillary 107 (H) 65 - 99 mg/dL  Glucose, capillary     Status: Abnormal   Collection Time: 09/05/15  8:06 AM  Result Value Ref Range   Glucose-Capillary 103 (H) 65 - 99 mg/dL  Glucose, capillary     Status: Abnormal   Collection Time: 09/05/15 11:42 AM  Result Value Ref Range   Glucose-Capillary 108 (H) 65 - 99 mg/dL    Vitals: Blood pressure 135/92, pulse 73, temperature 99.4 F (37.4 C), temperature source Oral, resp. rate 20, height 5' 9" (1.753 m), weight 92.5 kg (203 lb 14.8 oz), SpO2 97 %.  Risk to Self: Is patient at risk for suicide?: No Risk to Others:   Prior Inpatient Therapy:    Prior Outpatient Therapy:    Current Facility-Administered Medications  Medication Dose Route Frequency Provider Last Rate Last Dose  . 0.9 %  sodium chloride infusion  250 mL Intravenous PRN Theressa Millard, MD 10 mL/hr at 09/04/15 0800 250 mL at 09/04/15 0800  . Ampicillin-Sulbactam (UNASYN) 3 g in sodium chloride 0.9 % 100 mL IVPB  3 g Intravenous Q6H Chesley Mires, MD   3 g at 09/05/15 0548  . antiseptic oral rinse (CPC / CETYLPYRIDINIUM CHLORIDE 0.05%) solution 7 mL  7 mL Mouth Rinse q12n4p Raylene Miyamoto, MD   7 mL at 09/04/15 1517  . chlorhexidine (PERIDEX) 0.12 % solution 15 mL  15 mL Mouth Rinse BID Raylene Miyamoto, MD   15 mL at 09/04/15 2059  . cloNIDine (CATAPRES - Dosed in mg/24 hr) patch 0.3 mg  0.3 mg Transdermal Q Tue Rush Farmer, MD   0.3 mg at 09/03/15 1120  . dexmedetomidine (PRECEDEX) 400 MCG/100ML (4 mcg/mL) infusion  0.4-0.7 mcg/kg/hr Intravenous Titrated Javier Glazier, MD 6.8 mL/hr at 09/05/15 1111 0.3 mcg/kg/hr at 09/05/15 1111  . dextrose 5 %-0.45 % sodium chloride infusion   Intravenous Continuous Collene Gobble, MD 10 mL/hr at 09/01/15 1037 10 mL/hr at 09/01/15 1037  . enoxaparin (LOVENOX) injection 40 mg  40 mg Subcutaneous Q24H Raylene Miyamoto, MD   40 mg at 09/05/15 1002  . feeding supplement (ENSURE ENLIVE) (ENSURE ENLIVE) liquid 237 mL  237 mL Oral BID BM Asencion Islam, RD   237 mL at 09/04/15 1400  . folic acid injection 1 mg  1 mg Intravenous Daily Chesley Mires, MD   1 mg at 09/05/15 0959  . hydrALAZINE (APRESOLINE) injection 10-40 mg  10-40 mg Intravenous Q4H PRN Rigoberto Noel, MD   20 mg at 09/04/15 1404  . labetalol (NORMODYNE,TRANDATE)  injection 10-20 mg  10-20 mg Intravenous Q2H PRN Rakesh Alva V, MD   20 mg at 09/03/15 0536  . levETIRAcetam (KEPPRA) 500 mg in sodium chloride 0.9 % 100 mL IVPB  500 mg Intravenous Q12H Gokul Krishnan, MD   500 mg at 09/05/15 0825  . LORazepam (ATIVAN) injection 1-4 mg  1-4 mg Intravenous Q4H PRN Vineet Sood,  MD   2 mg at 09/05/15 0944  . LORazepam (ATIVAN) tablet 1 mg  1 mg Oral 4 times per day Michael A Maccia, RPH   1 mg at 09/05/15 1108  . metoprolol tartrate (LOPRESSOR) tablet 25 mg  25 mg Oral BID Wesam G Yacoub, MD   25 mg at 09/05/15 0954  . nicotine (NICODERM CQ - dosed in mg/24 hours) patch 14 mg  14 mg Transdermal Daily Harvette C Jenkins, MD   14 mg at 09/05/15 1004  . ondansetron (ZOFRAN) injection 4 mg  4 mg Intravenous Q6H PRN Harvette C Jenkins, MD      . pantoprazole (PROTONIX) injection 40 mg  40 mg Intravenous Q24H Vineet Sood, MD   40 mg at 09/05/15 0819  . QUEtiapine (SEROQUEL) tablet 100 mg  100 mg Oral BID Daniel J Feinstein, MD      . sodium chloride 0.9 % injection 3 mL  3 mL Intravenous Q12H Harvette C Jenkins, MD   3 mL at 09/05/15 1002  . sodium chloride 0.9 % injection 3 mL  3 mL Intravenous PRN Harvette C Jenkins, MD   3 mL at 09/01/15 1036  . thiamine (B-1) injection 100 mg  100 mg Intravenous Daily Gokul Krishnan, MD   100 mg at 09/05/15 0958    Musculoskeletal: Strength & Muscle Tone: within normal limits Gait & Station: unable to stand Patient leans: N/A  Psychiatric Specialty Exam: Physical Exam as per history and physical  ROS complains of a headache, agitation, uses foul language And paranoid.  Blood pressure 135/92, pulse 73, temperature 99.4 F (37.4 C), temperature source Oral, resp. rate 20, height 5' 9" (1.753 m), weight 92.5 kg (203 lb 14.8 oz), SpO2 97 %.Body mass index is 30.1 kg/(m^2).  General Appearance: Guarded  Eye Contact::  Good  Speech:  Pressured  Volume:  Increased  Mood:  Angry and Irritable  Affect:  Non-Congruent and Labile  Thought Process:  Disorganized, Irrelevant, Loose and Tangential  Orientation:  Full (Time, Place, and Person)  Thought Content:  Rumination  Suicidal Thoughts:  No  Homicidal Thoughts:  No  Memory:  Immediate;   Fair Recent;   Poor  Judgement:  Impaired  Insight:  Lacking  Psychomotor Activity:  Increased  and Restlessness  Concentration:  Poor  Recall:  Fair  Fund of Knowledge:Fair  Language: Good  Akathisia:  Negative  Handed:  Right  AIMS (if indicated):     Assets:  Communication Skills Financial Resources/Insurance Housing Intimacy Leisure Time Resilience Social Support Talents/Skills Transportation  ADL's:  Impaired  Cognition: Impaired,  Moderate  Sleep:      Medical Decision Making: Review of Psycho-Social Stressors (1), Review or order clinical lab tests (1), Established Problem, Worsening (2), Review of Last Therapy Session (1), Review or order medicine tests (1), Review of Medication Regimen & Side Effects (2) and Review of New Medication or Change in Dosage (2)  Treatment Plan Summary: Daily contact with patient to assess and evaluate symptoms and progress in treatment and Medication management  Plan:  Case discussed with the psychiatric social service Safety concerns: Continue safety   sitter Continue involuntary commitment petition Continue Seroquel 100 mg twice daily for agitation and aggressive behaviors Continue alcohol detox treatment with the precedex Continue treatment of hypertensive crisis Patient does not meet criteria for psychiatric inpatient admission. Supportive therapy provided about ongoing stressors. Appreciate psychiatric consultation follow-up as clinically required Please contact [phone] or [phone] if needs further assistance   Disposition: Patient benefit from substance abuse rehabilitation, residential when medically stable.   JONNALAGADDA,JANARDHAHA R. 09/05/2015 12:05 PM  

## 2015-09-05 NOTE — Progress Notes (Signed)
Spoke with E-link shortly after patient's transfer to our unit regarding the patient's "stepdown" status while the patient is on the precedex gtt.

## 2015-09-05 NOTE — Progress Notes (Signed)
PULMONARY / CRITICAL CARE MEDICINE   Name: Richard [name] MRN: [id number] DOB: September 23, 1971    ADMISSION DATE:  08/25/2015 CONSULTATION DATE:  08/26/2015  REFERRING MD :  Triad  CHIEF COMPLAINT:  Altered mental status  INITIAL PRESENTATION:  44 yo male smoker presented with syncope, HA from HTN emergency (BP 182/116).  Transferred to ICU with ETOH withdrawal.  Hx of HTN, Sz's, Lt leg DVT on xarelto, Cocaine abuse, asthma, remoteTBI.  STUDIES:  9/11 CT head >> Lt frontal gliosis with white matter disease, no acute findings 9/12 EEG >> normal sedated EEG 9/14 scrotal u/s >> b/l hydrocele  SIGNIFICANT EVENTS: 9/11 Admit 9/12 Psych assessment >> involuntary commitment; transfer to ICU for precedex  SUBJECTIVE:  Remains on precedex  VITAL SIGNS: Temp:  [97.4 F (36.3 C)-98.9 F (37.2 C)] 97.4 F (36.3 C) (09/22 0816) Pulse Rate:  [65-97] 77 (09/22 0900) Resp:  [13-28] 14 (09/22 0900) BP: (113-184)/(79-137) 139/102 mmHg (09/22 0830) SpO2:  [87 %-97 %] 93 % (09/22 0900) Weight:  [92.5 kg (203 lb 14.8 oz)] 92.5 kg (203 lb 14.8 oz) (09/22 0349) INTAKE / OUTPUT:  Intake/Output Summary (Last 24 hours) at 09/05/15 1001 Last data filed at 09/05/15 0800  Gross per 24 hour  Intake 1689.28 ml  Output   1715 ml  Net -25.72 ml   PHYSICAL EXAMINATION: General: calmer, sleeping int Neuro:  RASS -1, better, oriented to self HEENT:  Pupils reactive, poor oral hygiene. Cardiovascular:  s1 s2 Regular, no murmur. Lungs:  CTA reduced Abdomen:  Soft, non tender. GU: scrotal swelling, non tender. Musculoskeletal:  No edema. Skin:  No rashes.  LABS:  CBC  Recent Labs Lab 08/31/15 0436 09/03/15 0526 09/04/15 0215  WBC 9.5 7.6 8.1  HGB 15.7 15.9 15.3  HCT 47.7 48.3 46.0  PLT 226 276 312   BMET  Recent Labs Lab 08/31/15 0436 09/03/15 0526 09/04/15 0215  NA 138 139 137  K 4.0 3.9 4.1  CL 107 105 103  CO2 '23 26 28  '$ BUN '11 8 11  '$ CREATININE 0.78 0.76 0.94  GLUCOSE 117* 107*  98   Electrolytes  Recent Labs Lab 08/31/15 0436 09/03/15 0526 09/04/15 0215  CALCIUM 8.8* 9.0 8.7*  MG  --  1.9 2.1  PHOS  --  3.4 3.6   Liver Enzymes No results for input(s): AST, ALT, ALKPHOS, BILITOT, ALBUMIN in the last 168 hours. Glucose  Recent Labs Lab 09/04/15 0012 09/04/15 0433 09/04/15 2012 09/04/15 2347 09/05/15 0348 09/05/15 0806  GLUCAP 111* 108* 101* 99 107* 103*   Imaging No results found.  ASSESSMENT / PLAN:  PULMONARY A: Tobacco abuse with hx of asthma. Aspiration pneumonia P:   Nicotine patch. PRN albuterol. Monitor in ICU for airway protection so far so good  CARDIOVASCULAR A:  Syncope in setting of HTN emergency. controlled P:  PRN labetalol, hydralazine. Maintain clonidine patch to 0.3  metoprolol 25 mg PO BID with holding parameters. No additional meds required at this stage, but if precedex reduced, may need addition  hydral as HR lower side  RENAL A:   Hypokalemia >> resolved. Acute urine retention >> 800 ml noted on bladder scan 9/13 -foley Scrotal swelling >> b/l hydrocele P:  NS at [place] foley, add condom cath and follow output BMET in AM.  GASTROINTESTINAL A:   Nutrition. Hx of GERD. P:   PO diet per recommendations of SLP Continue protonix.  HEMATOLOGIC A:   Hx of Lt leg DVT from 02/19/15 during acute hospitalization  with prolonged bedrest >> no other reported hx of thromboembolic disease. P:  F/u CBC. Hold xarelto >> fall risk Lovenox prophy, crt in am   INFECTIOUS A:   Aspiration pna P:   Empiric unasyn 9/16 >>9/23 Follow fever curve  ENDOCRINE A:   Hyperglycemia.   P:   Monitor blood sugar >> SSI if CBG > 180  NEUROLOGIC A:   Acute encephalopathy 2nd to HTN emergency and ETOH withdrawal with delirium tremens. Hx of polysubstance abuse, TBI, seizures. Hx TBI with baseline cognitive deficits P:   RASS goal 0 Continue precedex, weaning as able to off today as goal seroquel 25 TID but not an  inpatient candidate, increase this med PRN ativan Thiamine, folic acid clonidine patch to 0.3 Continue keppra IV  Involuntary commitment ordered 09/02/15 >> expires on 09/08/15. Schedule ativan   Ccm time 30 min   Lavon Paganini. Titus Mould, MD, [place] Pgr: [facility]

## 2015-09-06 DIAGNOSIS — N508 Other specified disorders of male genital organs: Secondary | ICD-10-CM

## 2015-09-06 DIAGNOSIS — F488 Other specified nonpsychotic mental disorders: Secondary | ICD-10-CM

## 2015-09-06 DIAGNOSIS — N5089 Other specified disorders of the male genital organs: Secondary | ICD-10-CM | POA: Insufficient documentation

## 2015-09-06 LAB — COMPREHENSIVE METABOLIC PANEL
ALBUMIN: 3 g/dL — AB (ref 3.5–5.0)
ALK PHOS: 69 U/L (ref 38–126)
ALT: 14 U/L — ABNORMAL LOW (ref 17–63)
ANION GAP: 6 (ref 5–15)
AST: 15 U/L (ref 15–41)
BILIRUBIN TOTAL: 0.6 mg/dL (ref 0.3–1.2)
BUN: 12 mg/dL (ref 6–20)
CALCIUM: 9.2 mg/dL (ref 8.9–10.3)
CO2: 28 mmol/L (ref 22–32)
Chloride: 104 mmol/L (ref 101–111)
Creatinine, Ser: 0.76 mg/dL (ref 0.61–1.24)
GFR calc non Af Amer: >60 mL/min (ref >60)
GLUCOSE: 127 mg/dL — AB (ref 65–99)
POTASSIUM: 3.8 mmol/L (ref 3.5–5.1)
Sodium: 138 mmol/L (ref 135–145)
TOTAL PROTEIN: 6.9 g/dL (ref 6.5–8.1)

## 2015-09-06 LAB — GLUCOSE, CAPILLARY
GLUCOSE-CAPILLARY: 108 mg/dL — AB (ref 65–99)
GLUCOSE-CAPILLARY: 109 mg/dL — AB (ref 65–99)
GLUCOSE-CAPILLARY: 130 mg/dL — AB (ref 65–99)
GLUCOSE-CAPILLARY: 140 mg/dL — AB (ref 65–99)
Glucose-Capillary: 123 mg/dL — ABNORMAL HIGH (ref 65–99)
Glucose-Capillary: 71 mg/dL (ref 65–99)

## 2015-09-06 MED ORDER — HALOPERIDOL LACTATE 5 MG/ML IJ SOLN
1.0000 mg | INTRAMUSCULAR | Status: DC | PRN
  Administered 2015-09-07: 1 mg via INTRAVENOUS
  Administered 2015-09-07: 2 mg via INTRAVENOUS
  Administered 2015-09-08 (×2): 4 mg via INTRAVENOUS
  Filled 2015-09-06 (×3): qty 1

## 2015-09-06 MED ORDER — FOLIC ACID 1 MG PO TABS
1.0000 mg | ORAL_TABLET | Freq: Every day | ORAL | Status: DC
  Administered 2015-09-06 – 2015-09-10 (×5): 1 mg via ORAL
  Filled 2015-09-06 (×5): qty 1

## 2015-09-06 MED ORDER — PANTOPRAZOLE SODIUM 40 MG PO TBEC
40.0000 mg | DELAYED_RELEASE_TABLET | Freq: Every day | ORAL | Status: DC
  Administered 2015-09-07 – 2015-09-10 (×4): 40 mg via ORAL
  Filled 2015-09-06 (×4): qty 1

## 2015-09-06 MED ORDER — HALOPERIDOL LACTATE 5 MG/ML IJ SOLN
2.0000 mg | Freq: Four times a day (QID) | INTRAMUSCULAR | Status: DC
  Administered 2015-09-06 – 2015-09-07 (×3): 2 mg via INTRAVENOUS
  Filled 2015-09-06 (×2): qty 0.4
  Filled 2015-09-06: qty 1
  Filled 2015-09-06: qty 0.4
  Filled 2015-09-06: qty 1
  Filled 2015-09-06: qty 0.4
  Filled 2015-09-06: qty 1

## 2015-09-06 MED ORDER — LORAZEPAM 2 MG/ML IJ SOLN
2.0000 mg | Freq: Four times a day (QID) | INTRAMUSCULAR | Status: DC
  Administered 2015-09-06 – 2015-09-07 (×3): 2 mg via INTRAVENOUS
  Filled 2015-09-06 (×3): qty 1

## 2015-09-06 MED ORDER — HALOPERIDOL LACTATE 5 MG/ML IJ SOLN
5.0000 mg | INTRAMUSCULAR | Status: DC
  Administered 2015-09-06: 5 mg via INTRAVENOUS
  Filled 2015-09-06: qty 1

## 2015-09-06 MED ORDER — VITAMIN B-1 100 MG PO TABS
100.0000 mg | ORAL_TABLET | Freq: Every day | ORAL | Status: DC
  Administered 2015-09-06 – 2015-09-10 (×5): 100 mg via ORAL
  Filled 2015-09-06 (×5): qty 1

## 2015-09-06 NOTE — Progress Notes (Addendum)
PULMONARY / CRITICAL CARE MEDICINE   Name: Richard [name] MRN: [id number] DOB: June 05, 1971    ADMISSION DATE:  08/25/2015 CONSULTATION DATE:  08/26/2015  REFERRING MD :  [facility]ad  CHIEF COMPLAINT:  Altered mental status  INITIAL PRESENTATION:  44 yo male smoker presented with syncope, HA from HTN emergency (BP 182/116).  Transferred to ICU with ETOH withdrawal.  Hx of HTN, Sz's, Lt leg DVT on xarelto, Cocaine abuse, asthma, remoteTBI.  STUDIES:  9/11 CT head >> Lt frontal gliosis with white matter disease, no acute findings 9/12 EEG >> normal sedated EEG 9/14 scrotal u/s >> b/l hydrocele  SIGNIFICANT EVENTS: 9/11 Admit 9/12 Psych assessment >> involuntary commitment; transfer to ICU for precedex  SUBJECTIVE:  Remains on precedex, had increased  VITAL SIGNS: Temp:  [97.4 F (36.3 C)-99.4 F (37.4 C)] 97.4 F (36.3 C) (09/23 0326) Pulse Rate:  [57-90] 57 (09/23 0600) Resp:  [12-25] 16 (09/23 0600) BP: (111-171)/(79-118) 171/109 mmHg (09/23 0600) SpO2:  [84 %-98 %] 95 % (09/23 0600) Weight:  [90.1 kg (198 lb 10.2 oz)] 90.1 kg (198 lb 10.2 oz) (09/23 0454) INTAKE / OUTPUT:  Intake/Output Summary (Last 24 hours) at 09/06/15 9562 Last data filed at 09/06/15 1308  Gross per 24 hour  Intake 2266.6 ml  Output   1530 ml  Net  736.6 ml   PHYSICAL EXAMINATION: General: rass -2 Neuro:  RASS -2, better, oriented to self HEENT:  Pupils reactive, poor oral hygiene Cardiovascular:  s1 s2 Regular, no murmur Lungs:  CTA , good strong cough Abdomen:  Soft, non tender no r.g GU: scrotal swelling unchanged Musculoskeletal:  No edema. Skin:  No rashes.  LABS:  CBC  Recent Labs Lab 08/31/15 0436 09/03/15 0526 09/04/15 0215  WBC 9.5 7.6 8.1  HGB 15.7 15.9 15.3  HCT 47.7 48.3 46.0  PLT 226 276 312   BMET  Recent Labs Lab 09/03/15 0526 09/04/15 0215 09/06/15 0234  NA 139 137 138  K 3.9 4.1 3.8  CL 105 103 104  CO2 $Re'26 28 28  'MkL$ BUN $R'8 11 12  'LP$ CREATININE 0.76 0.94 0.76   GLUCOSE 107* 98 127*   Electrolytes  Recent Labs Lab 09/03/15 0526 09/04/15 0215 09/06/15 0234  CALCIUM 9.0 8.7* 9.2  MG 1.9 2.1  --   PHOS 3.4 3.6  --    Liver Enzymes  Recent Labs Lab 09/06/15 0234  AST 15  ALT 14*  ALKPHOS 69  BILITOT 0.6  ALBUMIN 3.0*   Glucose  Recent Labs Lab 09/05/15 0806 09/05/15 1142 09/05/15 1557 09/05/15 1957 09/05/15 2332 09/06/15 0325  GLUCAP 103* 108* 117* 125* 140* 123*   Imaging No results found.  ASSESSMENT / PLAN:  PULMONARY A: Tobacco abuse with hx of asthma. Aspiration pneumonia Strong cough P:   Nicotine patch. PRN albuterol. Airway prtection is wnl  CARDIOVASCULAR A:  Syncope in setting of HTN emergency. uncontrolled P:  PRN labetalol Maintain clonidine patch to 0.3  metoprolol 25 mg PO BID with holding parameters. Not taking pills this asm , IV hydralaZine If able to take pills then add oral hydralazine   RENAL A:   Acute urine retention >> 800 ml noted on bladder scan 9/13 -foley Foley dc'ed 9/22 with urine noted Scrotal swelling >> b/l hydrocele P:  NS at [facility] BMET in AM.  GASTROINTESTINAL A:   Nutrition. Hx of GERD. Poor intake P:   PO diet per recommendations of SLP Continue protonix. May need calorie count and NGT and feeds  HEMATOLOGIC  A:   Hx of Lt leg DVT from 02/19/15 during acute hospitalization with prolonged bedrest >> no other reported hx of thromboembolic disease. P:  F/u CBC in am  Hold xarelto >> fall risk Lovenox prophy, crt in am   INFECTIOUS A:   Aspiration pna P:   Empiric unasyn 9/16 >>9/23 Allow to end today Follow fever curve  ENDOCRINE A:   Hyperglycemia.   P:   Monitor blood sugar >> SSI if CBG > 180  NEUROLOGIC A:   Acute encephalopathy 2nd to HTN emergency and ETOH withdrawal with delirium tremens. Hx of polysubstance abuse, TBI, seizures. Hx TBI with baseline cognitive deficits rass -2 today 9/23 P:   RASS goal 0 to -1 Continue precedex,  but with current rass likely can wean to off with ativan given scheduled If unable then re add precedex and add haldol IV scheduled and escalate seroquel seroquel maintain, may need increase PRN versed Thiamine, folic acid clonidine patch to 0.3 Continue keppra IV  Involuntary commitment ordered 09/02/15 >> expires on 09/08/15. Schedule ativan  If above fail , consider depakote for refractory deleirium  Ccm time 30 min   Lavon Paganini. Titus Mould, MD, [place] Pgr: [place] [facility]

## 2015-09-06 NOTE — Care Management Note (Signed)
Case Management Note  Patient Details  Name: Richard [name] MRN: [id number] Date of Birth: 1971/07/30  Subjective/Objective:    Remains on precedex, wife at bedside.  Wife states he lives with her, both are independent.  Grandchildren stay with them off and on and youngest has been going back and forth to the hospital at [facility].  States he knows he is not supposed to be drinking beer but had started to drink wine coolers. SW consult for ETOH when able to talk.                Action/Plan:   Expected Discharge Date:  08/28/15               Expected Discharge Plan:  Home/Self Care  In-House Referral:  Clinical Social Work  Discharge planning Services     Post Acute Care Choice:    Choice offered to:     DME Arranged:    DME Agency:     HH Arranged:    HH Agency:     Status of Service:  In process, will continue to follow  Medicare Important Message Given:  Yes-third notification given Date Medicare IM Given:    Medicare IM give by:    Date Additional Medicare IM Given:    Additional Medicare Important Message give by:     If discussed at [place] of Stay Meetings, dates discussed:    Additional Comments:  Vergie Living, RN 09/06/2015, 10:22 AM

## 2015-09-06 NOTE — Care Management Important Message (Signed)
Important Message  Patient Details  Name: Richard [name] MRN: [id number] Date of Birth: [birth year]   Medicare Important Message Given:  Yes-third notification given    Loann Quill 09/06/2015, 10:34 AM

## 2015-09-07 LAB — CBC WITH DIFFERENTIAL/PLATELET
Basophils Absolute: 0 10*3/uL (ref 0.0–0.1)
Basophils Relative: 0 %
Eosinophils Absolute: 0 10*3/uL (ref 0.0–0.7)
Eosinophils Relative: 0 %
HEMATOCRIT: 46.6 % (ref 39.0–52.0)
HEMOGLOBIN: 15 g/dL (ref 13.0–17.0)
LYMPHS ABS: 1.3 10*3/uL (ref 0.7–4.0)
LYMPHS PCT: 15 %
MCH: 30.3 pg (ref 26.0–34.0)
MCHC: 32.2 g/dL (ref 30.0–36.0)
MCV: 94.1 fL (ref 78.0–100.0)
MONO ABS: 0.8 10*3/uL (ref 0.1–1.0)
MONOS PCT: 9 %
NEUTROS ABS: 6.4 10*3/uL (ref 1.7–7.7)
NEUTROS PCT: 76 %
Platelets: 295 10*3/uL (ref 150–400)
RBC: 4.95 MIL/uL (ref 4.22–5.81)
RDW: 14.3 % (ref 11.5–15.5)
WBC: 8.5 10*3/uL (ref 4.0–10.5)

## 2015-09-07 LAB — BASIC METABOLIC PANEL
ANION GAP: 8 (ref 5–15)
BUN: 15 mg/dL (ref 6–20)
CALCIUM: 9 mg/dL (ref 8.9–10.3)
CHLORIDE: 104 mmol/L (ref 101–111)
CO2: 28 mmol/L (ref 22–32)
Creatinine, Ser: 0.87 mg/dL (ref 0.61–1.24)
GFR calc Af Amer: >60 mL/min (ref >60)
GFR calc non Af Amer: >60 mL/min (ref >60)
GLUCOSE: 102 mg/dL — AB (ref 65–99)
Potassium: 4 mmol/L (ref 3.5–5.1)
Sodium: 140 mmol/L (ref 135–145)

## 2015-09-07 LAB — GLUCOSE, CAPILLARY
Glucose-Capillary: 105 mg/dL — ABNORMAL HIGH (ref 65–99)
Glucose-Capillary: 106 mg/dL — ABNORMAL HIGH (ref 65–99)

## 2015-09-07 MED ORDER — NICOTINE 7 MG/24HR TD PT24
7.0000 mg | MEDICATED_PATCH | Freq: Every day | TRANSDERMAL | Status: DC
  Administered 2015-09-07 – 2015-09-10 (×4): 7 mg via TRANSDERMAL
  Filled 2015-09-07 (×4): qty 1

## 2015-09-07 MED ORDER — LORAZEPAM 2 MG/ML IJ SOLN
1.0000 mg | Freq: Four times a day (QID) | INTRAMUSCULAR | Status: DC | PRN
Start: 2015-09-07 — End: 2015-09-10
  Administered 2015-09-07 – 2015-09-08 (×3): 2 mg via INTRAVENOUS
  Filled 2015-09-07 (×3): qty 1
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
## 2015-09-07 NOTE — Progress Notes (Signed)
Patient agitated, demanding to go home, patient calling family. Haldol administered first, then Lorazepam.  Will continue to closely monitor.  Sitter at bedside.

## 2015-09-07 NOTE — Progress Notes (Signed)
Magistrate's office called regarding IVC paperwork. In chart, states papers expire tonight at 0000, but "FINDINGS AND CUSTODY ORDER IVC" states papers were issued on 9/20. [place] office states papers will expire on 09/10/15 per order.

## 2015-09-07 NOTE — Progress Notes (Signed)
PULMONARY / CRITICAL CARE MEDICINE   Name: Richard [name] MRN: [id number] DOB: 01-16-1971    ADMISSION DATE:  08/25/2015 CONSULTATION DATE:  08/26/2015  REFERRING MD :  Triad  CHIEF COMPLAINT:  Altered mental status  INITIAL PRESENTATION:  44 yo male smoker presented with syncope, HA from HTN emergency (BP 182/116).  Transferred to ICU with ETOH withdrawal.  Hx of HTN, Sz's, Lt leg DVT on xarelto, Cocaine abuse, asthma, remoteTBI.  STUDIES:  9/11 CT head >> Lt frontal gliosis with white matter disease, no acute findings 9/12 EEG >> normal sedated EEG 9/14 scrotal u/s >> b/l hydrocele  SIGNIFICANT EVENTS: 9/11 Admit 9/12 Psych assessment >> involuntary commitment; transfer to ICU for precedex  SUBJECTIVE:  Off  precedex  VITAL SIGNS: Temp:  [97.4 F (36.3 C)-99.6 F (37.6 C)] 99.4 F (37.4 C) (09/24 0327) Pulse Rate:  [62-98] 88 (09/24 0600) Resp:  [10-18] 18 (09/24 0600) BP: (119-153)/(73-122) 143/87 mmHg (09/24 0600) SpO2:  [92 %-99 %] 95 % (09/24 0600) Weight:  [200 lb 2.8 oz (90.8 kg)] 200 lb 2.8 oz (90.8 kg) (09/24 0233) INTAKE / OUTPUT:  Intake/Output Summary (Last 24 hours) at 09/07/15 0805 Last data filed at 09/07/15 0748  Gross per 24 hour  Intake 1249.05 ml  Output   1625 ml  Net -375.95 ml   PHYSICAL EXAMINATION: General: Off precedex, confused and dull affect Neuro:  RASS -1, better, oriented to self,not to time or place HEENT:  Pupils reactive, poor oral hygiene. Cardiovascular:  s1 s2 Regular, no murmur. Lungs:  CTA reduced Abdomen:  Soft, non tender. GU: scrotal swelling, non tender. Musculoskeletal:  No edema. Skin:  No rashes.  LABS:  CBC  Recent Labs Lab 09/03/15 0526 09/04/15 0215 09/07/15 0239  WBC 7.6 8.1 8.5  HGB 15.9 15.3 15.0  HCT 48.3 46.0 46.6  PLT 276 312 295   BMET  Recent Labs Lab 09/04/15 0215 09/06/15 0234 09/07/15 0239  NA 137 138 140  K 4.1 3.8 4.0  CL 103 104 104  CO2 '28 28 28  '$ BUN '11 12 15  '$ CREATININE  0.94 0.76 0.87  GLUCOSE 98 127* 102*   Electrolytes  Recent Labs Lab 09/03/15 0526 09/04/15 0215 09/06/15 0234 09/07/15 0239  CALCIUM 9.0 8.7* 9.2 9.0  MG 1.9 2.1  --   --   PHOS 3.4 3.6  --   --    Liver Enzymes  Recent Labs Lab 09/06/15 0234  AST 15  ALT 14*  ALKPHOS 69  BILITOT 0.6  ALBUMIN 3.0*   Glucose  Recent Labs Lab 09/06/15 0802 09/06/15 1143 09/06/15 1555 09/06/15 1943 09/06/15 2330 09/07/15 0325  GLUCAP 71 108* 109* 130* 105* 106*   Imaging No results found.  ASSESSMENT / PLAN:  PULMONARY A: Tobacco abuse with hx of asthma. Aspiration pneumonia P:   Nicotine patch. 9/24 7 mg PRN albuterol. Monitor in ICU for airway protection so far so good  CARDIOVASCULAR A:  Syncope in setting of HTN emergency. controlled P:  PRN labetalol, hydralazine. Maintain clonidine patch to 0.3  metoprolol 25 mg PO BID with holding parameters.  RENAL A:   Hypokalemia >> resolved. Acute urine retention >> 800 ml noted on bladder scan 9/13 -foley Scrotal swelling >> b/l hydrocele P:  NS at [place] foley, add condom cath and follow output   GASTROINTESTINAL A:   Nutrition. Hx of GERD. P:   PO diet per recommendations of SLP Continue protonix.  HEMATOLOGIC A:   Hx of Lt leg  DVT from 02/19/15 during acute hospitalization with prolonged bedrest >> no other reported hx of thromboembolic disease. P:  F/u CBC. Hold xarelto >> fall risk Lovenox prophylaxis , creatine stable  INFECTIOUS A:   Aspiration pna P:   Empiric unasyn 9/16 >>9/23 Follow fever curve  ENDOCRINE CBG (last 3)   Recent Labs  09/06/15 1943 09/06/15 2330 09/07/15 0325  GLUCAP 130* 105* 106*     A:   Hyperglycemia.   P:   Monitor blood sugar >> SSI if CBG > 180  NEUROLOGIC A:   Acute encephalopathy 2nd to HTN emergency and ETOH withdrawal with delirium tremens. Hx of polysubstance abuse, TBI, seizures. Hx TBI with baseline cognitive deficits P:   RASS goal 0   precedex, off, will leave off as tolerated seroquel 25 TID  PRN ativan Thiamine, folic acid clonidine patch to 0.3 Continue keppra IV  Involuntary commitment ordered 09/02/15 >> expires on 09/08/15. Scheduled ativan   GLOBAL: Off precedex, sitter in room. Consider tx to floor and back to triad service.  Richardson Landry Minor ACNP [facility] PCCM Pager [phone] till 3 pm If no answer page [phone] 09/07/2015, 8:06 AM

## 2015-09-08 LAB — BASIC METABOLIC PANEL
ANION GAP: 7 (ref 5–15)
BUN: 15 mg/dL (ref 6–20)
CHLORIDE: 103 mmol/L (ref 101–111)
CO2: 27 mmol/L (ref 22–32)
Calcium: 8.8 mg/dL — ABNORMAL LOW (ref 8.9–10.3)
Creatinine, Ser: 0.93 mg/dL (ref 0.61–1.24)
GFR calc Af Amer: >60 mL/min (ref >60)
GFR calc non Af Amer: >60 mL/min (ref >60)
GLUCOSE: 97 mg/dL (ref 65–99)
POTASSIUM: 3.8 mmol/L (ref 3.5–5.1)
Sodium: 137 mmol/L (ref 135–145)

## 2015-09-08 LAB — CBC
HEMATOCRIT: 46.4 % (ref 39.0–52.0)
HEMOGLOBIN: 15.3 g/dL (ref 13.0–17.0)
MCH: 31.2 pg (ref 26.0–34.0)
MCHC: 33 g/dL (ref 30.0–36.0)
MCV: 94.5 fL (ref 78.0–100.0)
Platelets: 276 10*3/uL (ref 150–400)
RBC: 4.91 MIL/uL (ref 4.22–5.81)
RDW: 14.3 % (ref 11.5–15.5)
WBC: 10.5 10*3/uL (ref 4.0–10.5)

## 2015-09-08 MED ORDER — DEXMEDETOMIDINE HCL IN NACL 400 MCG/100ML IV SOLN
0.4000 ug/kg/h | INTRAVENOUS | Status: DC
  Administered 2015-09-08: 0.4 ug/kg/h via INTRAVENOUS
  Administered 2015-09-08 – 2015-09-09 (×5): 1.2 ug/kg/h via INTRAVENOUS
  Filled 2015-09-08: qty 50
  Filled 2015-09-08 (×2): qty 100
  Filled 2015-09-08 (×2): qty 50
  Filled 2015-09-08: qty 100

## 2015-09-08 MED ORDER — QUETIAPINE FUMARATE 25 MG PO TABS
200.0000 mg | ORAL_TABLET | Freq: Two times a day (BID) | ORAL | Status: DC
  Administered 2015-09-08 – 2015-09-10 (×4): 200 mg via ORAL
  Filled 2015-09-08 (×4): qty 1
  Filled 2015-09-08: qty 8

## 2015-09-08 MED ORDER — HYDRALAZINE HCL 50 MG PO TABS
50.0000 mg | ORAL_TABLET | Freq: Three times a day (TID) | ORAL | Status: DC
  Administered 2015-09-08 – 2015-09-10 (×6): 50 mg via ORAL
  Filled 2015-09-08 (×9): qty 1

## 2015-09-08 MED ORDER — CLONAZEPAM 0.5 MG PO TABS
0.5000 mg | ORAL_TABLET | Freq: Two times a day (BID) | ORAL | Status: DC
  Administered 2015-09-08 – 2015-09-10 (×4): 0.5 mg via ORAL
  Filled 2015-09-08 (×5): qty 1

## 2015-09-08 MED ORDER — LEVETIRACETAM 500 MG PO TABS
500.0000 mg | ORAL_TABLET | Freq: Two times a day (BID) | ORAL | Status: DC
Start: 2015-09-08 — End: 2015-09-10
  Administered 2015-09-09 – 2015-09-10 (×3): 500 mg via ORAL
  Filled 2015-09-08 (×5): qty 1
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
## 2015-09-08 NOTE — Progress Notes (Signed)
eLink Physician-Brief Progress Note Patient Name: Richard [name] DOB: [birth year] MRN: [id number]   Date of Service  09/08/2015  HPI/Events of Note  More agitated. Trying to get out of bed. No response to haldol.  eICU Interventions  Ordered restrains and restart precedex     Intervention Category Major Interventions: Other:  Praveen Mannam 09/08/2015, 6:21 PM

## 2015-09-08 NOTE — Progress Notes (Signed)
PULMONARY / CRITICAL CARE MEDICINE   Name: Richard [name] MRN: [id number] DOB: [birth year]    ADMISSION DATE:  08/25/2015 CONSULTATION DATE:  08/26/2015  REFERRING MD :  Triad  CHIEF COMPLAINT:  Altered mental status  INITIAL PRESENTATION:  44 yo male smoker presented with syncope, HA from HTN emergency (BP 182/116).  Transferred to ICU with ETOH withdrawal.  Hx of HTN, Sz's, Lt leg DVT on xarelto, Cocaine abuse, asthma, remoteTBI.  STUDIES:  9/11 CT head >> Lt frontal gliosis with white matter disease, no acute findings 9/12 EEG >> normal sedated EEG 9/14 scrotal u/s >> b/l hydrocele  SIGNIFICANT EVENTS: 9/11 Admit 9/12 Psych assessment >> involuntary commitment; transfer to ICU for precedex 9/23 precedex dc 9/24 agitated 9/25 less agitated   SUBJECTIVE:  Off  Precedex. Require haldol during the night  VITAL SIGNS: Temp:  [98 F (36.7 C)-99.2 F (37.3 C)] 98 F (36.7 C) (09/25 0432) Pulse Rate:  [70-98] 70 (09/25 0700) Resp:  [9-19] 10 (09/25 0700) BP: (127-215)/(80-127) 160/102 mmHg (09/25 0700) SpO2:  [87 %-100 %] 95 % (09/25 0700) Weight:  [198 lb 3.1 oz (89.9 kg)] 198 lb 3.1 oz (89.9 kg) (09/25 0247) INTAKE / OUTPUT:  Intake/Output Summary (Last 24 hours) at 09/08/15 0800 Last data filed at 09/08/15 0737  Gross per 24 hour  Intake   1430 ml  Output   1975 ml  Net   -545 ml   PHYSICAL EXAMINATION: General: Off precedex, confused but more animated and cooperative Neuro:  RASS 1, better, oriented to self, HEENT:  Pupils reactive, poor oral hygiene. Cardiovascular:  s1 s2 Regular, no murmur. Lungs:  CTA reduced Abdomen:  Soft, non tender. GU: scrotal swelling, non tender. Musculoskeletal:  No edema. Skin:  No rashes.  LABS:  CBC  Recent Labs Lab 09/04/15 0215 09/07/15 0239 09/08/15 0313  WBC 8.1 8.5 10.5  HGB 15.3 15.0 15.3  HCT 46.0 46.6 46.4  PLT 312 295 276   BMET  Recent Labs Lab 09/06/15 0234 09/07/15 0239 09/08/15 0313  NA 138 140 137   K 3.8 4.0 3.8  CL 104 104 103  CO2 '28 28 27  '$ BUN '12 15 15  '$ CREATININE 0.76 0.87 0.93  GLUCOSE 127* 102* 97   Electrolytes  Recent Labs Lab 09/03/15 0526 09/04/15 0215 09/06/15 0234 09/07/15 0239 09/08/15 0313  CALCIUM 9.0 8.7* 9.2 9.0 8.8*  MG 1.9 2.1  --   --   --   PHOS 3.4 3.6  --   --   --    Liver Enzymes  Recent Labs Lab 09/06/15 0234  AST 15  ALT 14*  ALKPHOS 69  BILITOT 0.6  ALBUMIN 3.0*   Glucose  Recent Labs Lab 09/06/15 0802 09/06/15 1143 09/06/15 1555 09/06/15 1943 09/06/15 2330 09/07/15 0325  GLUCAP 71 108* 109* 130* 105* 106*   Imaging No results found.  ASSESSMENT / PLAN:  PULMONARY A: Tobacco abuse with hx of asthma. Aspiration pneumonia P:   Nicotine patch. 9/24 7 mg PRN albuterol. Monitor in ICU for airway protection so far so good  CARDIOVASCULAR A:  Syncope in setting of HTN emergency. controlled P:  PRN labetalol, hydralazine. Maintain clonidine patch to 0.3  metoprolol 25 mg PO BID with holding parameters. Add apresoline back  RENAL A:   Hypokalemia >> resolved. Acute urine retention >> 800 ml noted on bladder scan 9/13 -foley Scrotal swelling >> b/l hydrocele P:  NS at [place] foley, add condom cath and follow output  GASTROINTESTINAL A:   Nutrition. Hx of GERD. P:   PO diet per recommendations of SLP Continue protonix.  HEMATOLOGIC A:   Hx of Lt leg DVT from 02/19/15 during acute hospitalization with prolonged bedrest >> no other reported hx of thromboembolic disease. P:  F/u CBC. Hold xarelto >> fall risk Lovenox prophylaxis , creatine stable  INFECTIOUS A:   Aspiration pna P:   Empiric unasyn 9/16 >>9/23 Follow fever curve  ENDOCRINE CBG (last 3)   Recent Labs  09/06/15 1943 09/06/15 2330 09/07/15 0325  GLUCAP 130* 105* 106*     A:   Hyperglycemia.   P:   Monitor blood sugar >> SSI if CBG > 180  NEUROLOGIC A:   Acute encephalopathy 2nd to HTN emergency and ETOH withdrawal  with delirium tremens. Hx of polysubstance abuse, TBI, seizures. Hx TBI with baseline cognitive deficits P:   RASS goal 0  precedex, off, will leave off as tolerated seroquel 25 TID  PRN ativan Thiamine, folic acid clonidine patch to 0.3 Continue keppra IV  Involuntary commitment ordered 09/02/15 >> expires on 09/08/15. Scheduled ativan   GLOBAL: 9/24 during night [employer] and security called for agitation. IV haldol and ativan given 9/25 more compliant but underlying confusion remains. He can go to sdu with sitter.  Richardson Landry Alieah Brinton ACNP [facility] PCCM Pager [phone] till 3 pm If no answer page [phone] 09/08/2015, 8:00 AM

## 2015-09-09 LAB — MAGNESIUM: Magnesium: 1.9 mg/dL (ref 1.7–2.4)

## 2015-09-09 LAB — BASIC METABOLIC PANEL
ANION GAP: 9 (ref 5–15)
BUN: 12 mg/dL (ref 6–20)
CO2: 23 mmol/L (ref 22–32)
Calcium: 9.1 mg/dL (ref 8.9–10.3)
Chloride: 108 mmol/L (ref 101–111)
Creatinine, Ser: 0.69 mg/dL (ref 0.61–1.24)
GFR calc Af Amer: >60 mL/min (ref >60)
GFR calc non Af Amer: >60 mL/min (ref >60)
GLUCOSE: 136 mg/dL — AB (ref 65–99)
POTASSIUM: 4.3 mmol/L (ref 3.5–5.1)
Sodium: 140 mmol/L (ref 135–145)

## 2015-09-09 LAB — PHOSPHORUS: Phosphorus: 2.7 mg/dL (ref 2.5–4.6)

## 2015-09-09 MED ORDER — ACETAMINOPHEN 325 MG PO TABS
650.0000 mg | ORAL_TABLET | Freq: Four times a day (QID) | ORAL | Status: DC | PRN
  Administered 2015-09-09 – 2015-09-10 (×3): 650 mg via ORAL
  Filled 2015-09-09 (×3): qty 2

## 2015-09-09 NOTE — Progress Notes (Signed)
Pt transferred to 5W01.  All patient belongings were sent with the patient including cell phone.

## 2015-09-09 NOTE — Progress Notes (Signed)
Telephone report received from ICU nurse.

## 2015-09-09 NOTE — Progress Notes (Signed)
PULMONARY / CRITICAL CARE MEDICINE   Name: Richard [name] MRN: [id number] DOB: 23-May-1971    ADMISSION DATE:  08/25/2015 CONSULTATION DATE:  08/26/2015  REFERRING MD :  Triad  CHIEF COMPLAINT:  Altered mental status  INITIAL PRESENTATION:  44 yo male smoker presented with syncope, HA from HTN emergency (BP 182/116).  Transferred to ICU with ETOH withdrawal.  Hx of HTN, Sz's, Lt leg DVT on xarelto, Cocaine abuse, asthma, remoteTBI.  STUDIES:  9/11 CT head >> Lt frontal gliosis with white matter disease, no acute findings 9/12 EEG >> normal sedated EEG 9/14 scrotal u/s >> b/l hydrocele  SIGNIFICANT EVENTS: 9/11 Admit 9/12 Psych assessment >> involuntary commitment; transfer to ICU for precedex 9/23 precedex dc 9/24 agitated 9/25 precedex infusion restarted 9/26 precedex infusion discontinued in AM  SUBJECTIVE: Patient was combative last evening and placed back on Precedex infusion. This morning Precedex infusion was discontinued.  REVIEW OF SYSTEMS: Unable to obtain given altered mentation.  VITAL SIGNS: Temp:  [97.5 F (36.4 C)-98.9 F (37.2 C)] 98.6 F (37 C) (09/26 1119) Pulse Rate:  [63-98] 63 (09/26 1400) Resp:  [10-25] 14 (09/26 1400) BP: (109-180)/(71-157) 117/104 mmHg (09/26 1400) SpO2:  [87 %-98 %] 94 % (09/26 1400) Weight:  [192 lb 3.9 oz (87.2 kg)] 192 lb 3.9 oz (87.2 kg) (09/26 0346) INTAKE / OUTPUT:  Intake/Output Summary (Last 24 hours) at 09/09/15 1505 Last data filed at 09/09/15 1200  Gross per 24 hour  Intake 346.25 ml  Output   2415 ml  Net -2068.75 ml   PHYSICAL EXAMINATION: General:  Laying in bed in restraints. Severe at bedside. No acute distress. Integument:  Warm & dry. No rash on exposed skin.  HEENT:  Moist mucus membranes. No scleral injection or icterus. Cardiovascular:  Regular rate. No edema.  Pulmonary:  Coarse breath sounds bilaterally. Symmetric chest wall rise. No accessory muscle use. Abdomen: Soft. Normal bowel sounds.  Nondistended.  Neurological: Patient moving all 4 extremities equally. Remains altered and unable to answer questions appropriately. Calm at this time.  LABS:  CBC  Recent Labs Lab 09/04/15 0215 09/07/15 0239 09/08/15 0313  WBC 8.1 8.5 10.5  HGB 15.3 15.0 15.3  HCT 46.0 46.6 46.4  PLT 312 295 276   BMET  Recent Labs Lab 09/07/15 0239 09/08/15 0313 09/09/15 0245  NA 140 137 140  K 4.0 3.8 4.3  CL 104 103 108  CO2 '28 27 23  '$ BUN '15 15 12  '$ CREATININE 0.87 0.93 0.69  GLUCOSE 102* 97 136*   Electrolytes  Recent Labs Lab 09/03/15 0526 09/04/15 0215  09/07/15 0239 09/08/15 0313 09/09/15 0245  CALCIUM 9.0 8.7*  < > 9.0 8.8* 9.1  MG 1.9 2.1  --   --   --  1.9  PHOS 3.4 3.6  --   --   --  2.7  < > = values in this interval not displayed. Liver Enzymes  Recent Labs Lab 09/06/15 0234  AST 15  ALT 14*  ALKPHOS 69  BILITOT 0.6  ALBUMIN 3.0*   Glucose  Recent Labs Lab 09/06/15 0802 09/06/15 1143 09/06/15 1555 09/06/15 1943 09/06/15 2330 09/07/15 0325  GLUCAP 71 108* 109* 130* 105* 106*   Imaging No results found.  ASSESSMENT / PLAN:  PULMONARY A: H/O Asthma - no signs of exacerbation Aspiration Pneumonia vs Pneumonitis Tobacco abuse   P:   Nicotine patch. 9/24 7 mg PRN albuterol. Monitor in ICU for airway protection   CARDIOVASCULAR A:  Syncope - non-focal  in setting of hypertensive emergency Hypertensive Emergency  P:  PRN labetalol, hydralazine. Clonidine patch 0.3  Metoprolol 25 mg PO BID   RENAL A:   Hypokalemia - resolved. Acute urine retention - 800 ml noted on bladder scan 9/13 -foley Scrotal swelling - b/l hydrocele on ultrasound  P:  NS at [facility] Follow [facility]OP Monitor renal function w/ daily BUN/Creatinine  GASTROINTESTINAL A:   H/O GERD  P:   PO diet per recommendations of SLP Protonix PO daily  HEMATOLOGIC A:   H/O LLE DVT - March 2016 during hospitalization w/ prolonged bedrest.  P:  Monitor Hgb daily on  CBC Hold xarelto secondary to fall risk Lovenox '40mg'$  [place] q24hr SCDs  INFECTIOUS A:   Aspiration Pneumonia vs. Pneumonitis - S/P Treatment  P:   Empiric unasyn 9/16 >>9/23 Monitor for leukocytosis Pan-culture for fever >101.52F  ENDOCRINE A:   Hyperglycemia  P:   Monitor w/ daily electrolyte panel  NEUROLOGIC A:   Acute encephalopathy - Secondary to hypertensive emergency & ETOH withdrawal with delirium tremens. H/O polysubstance abuse H/O seizures. H/O TBI with baseline cognitive deficits  P:   RASS goal: 0 Seroquel '200mg'$  po bid Klonopin 0.'5mg'$  bid Catapres 0.3 patch Haldol IV prn Ativan IV prn Keppra PO bid Thiamine, folic acid  TODAY'S SUMMARY: Patient remains in restraints. Required Precedex overnight. As the patient has continued to require Precedex intermittently he will be maintained in the intensive care unit overnight.  I have spent a total of 36 minutes of critical care time today caring for this patient and reviewing the patient's electronic medical record.  Sonia Baller Ashok Cordia, M.D. [facility] Pulmonary & Critical Care Pager:  [phone] After 3pm or if no response, call [phone]  09/09/2015, 3:05 PM

## 2015-09-09 NOTE — Progress Notes (Signed)
[facility] Progress Note Patient Name: Markevion Lattin DOB: 1971-09-21 MRN: [id number]   Date of Service  09/09/2015  HPI/Events of Note  Pt improved and off precedex  eICU Interventions  tfr to floor      Intervention Category Major Interventions: Delirium, psychosis, severe agitation - evaluation and management  Asencion Noble 09/09/2015, 3:31 PM

## 2015-09-09 NOTE — Progress Notes (Signed)
Attempted to call report on pt to 5W.  Nurse unavailable, will return call.

## 2015-09-10 LAB — GLUCOSE, CAPILLARY
GLUCOSE-CAPILLARY: 94 mg/dL (ref 65–99)
GLUCOSE-CAPILLARY: 87 mg/dL (ref 65–99)
Glucose-Capillary: 90 mg/dL (ref 65–99)

## 2015-09-10 LAB — CBC WITH DIFFERENTIAL/PLATELET
BASOS ABS: 0.1 10*3/uL (ref 0.0–0.1)
BASOS PCT: 1 %
Eosinophils Absolute: 0 10*3/uL (ref 0.0–0.7)
Eosinophils Relative: 0 %
HEMATOCRIT: 48.4 % (ref 39.0–52.0)
HEMOGLOBIN: 15.7 g/dL (ref 13.0–17.0)
LYMPHS PCT: 22 %
Lymphs Abs: 2.6 10*3/uL (ref 0.7–4.0)
MCH: 30.8 pg (ref 26.0–34.0)
MCHC: 32.4 g/dL (ref 30.0–36.0)
MCV: 94.9 fL (ref 78.0–100.0)
MONO ABS: 1.2 10*3/uL — AB (ref 0.1–1.0)
Monocytes Relative: 10 %
NEUTROS ABS: 8 10*3/uL — AB (ref 1.7–7.7)
NEUTROS PCT: 67 %
Platelets: 292 10*3/uL (ref 150–400)
RBC: 5.1 MIL/uL (ref 4.22–5.81)
RDW: 14.3 % (ref 11.5–15.5)
WBC: 11.9 10*3/uL — AB (ref 4.0–10.5)

## 2015-09-10 LAB — RENAL FUNCTION PANEL
ALBUMIN: 3.1 g/dL — AB (ref 3.5–5.0)
ANION GAP: 8 (ref 5–15)
BUN: 14 mg/dL (ref 6–20)
CALCIUM: 9 mg/dL (ref 8.9–10.3)
CO2: 29 mmol/L (ref 22–32)
Chloride: 100 mmol/L — ABNORMAL LOW (ref 101–111)
Creatinine, Ser: 0.93 mg/dL (ref 0.61–1.24)
GFR calc Af Amer: >60 mL/min (ref >60)
GLUCOSE: 113 mg/dL — AB (ref 65–99)
PHOSPHORUS: 2.8 mg/dL (ref 2.5–4.6)
POTASSIUM: 3.8 mmol/L (ref 3.5–5.1)
SODIUM: 137 mmol/L (ref 135–145)

## 2015-09-10 LAB — MAGNESIUM: Magnesium: 1.7 mg/dL (ref 1.7–2.4)

## 2015-09-10 MED ORDER — CLONIDINE HCL 0.3 MG PO TABS: 0.3000 mg | ORAL_TABLET | Freq: Three times a day (TID) | ORAL | Status: DC

## 2015-09-10 MED ORDER — QUETIAPINE FUMARATE 200 MG PO TABS: 200.0000 mg | ORAL_TABLET | Freq: Two times a day (BID) | ORAL | Status: DC

## 2015-09-10 MED ORDER — CLONIDINE HCL 0.2 MG PO TABS
0.3000 mg | ORAL_TABLET | Freq: Three times a day (TID) | ORAL | Status: DC
  Administered 2015-09-10: 0.3 mg via ORAL
  Filled 2015-09-10 (×2): qty 1

## 2015-09-10 MED ORDER — LABETALOL HCL 200 MG PO TABS: 200.0000 mg | ORAL_TABLET | Freq: Two times a day (BID) | ORAL | Status: DC

## 2015-09-10 NOTE — Progress Notes (Signed)
[facility] PCCM  I just had a lengthy conversation with the patient, his wife, and with social work.  Social work asked me to sign his Involuntary commitment (IVC) paperwork for "medical commitment".  However he is no longer delirious, he is alert and oriented, his blood pressure is normal, his labs are normal, and he understands the nature of his situation and his care here.  Further, I reached his wife and explained that he had been treated for hypertensive emergency and alcohol withdrawal here.  I discussed with behavioral health and they stated that he did not need inpatient psyche care but needed to stay on his current medications and follow up with them as an outpatient.    From a medical standpoint he will need to stay on his current blood pressure regimen.  I explained this in detail to both he and his wife.   The same is true for his pscyche medications. They both want him to leave.  I can not find a reason to continue IVC at this time.  He does need a stat PT consult to ensure that he can walk safely and is not at excessive risk of fall.    As for his DVT, he no longer needs Xarelto as he has completed 6 months of treatment for his only provoked DVT.  All of this was explained to his wife in detail and she voiced understanding.  Roselie Awkward, MD [place] PCCM Pager: [phone] Cell: [phone] After 3pm or if no response, call [phone]

## 2015-09-10 NOTE — Evaluation (Signed)
Physical Therapy Evaluation Patient Details Name: Richard [name] MRN: [id number] DOB: Aug 04, 1971 Today's Date: 09/10/2015   History of Present Illness  Richard [name] is a 44 y.o. male with history of Malignant HTN, Seizures, LLE DVT on Xarelto Rx who presents to the ED with complaints of passing out in the Afternoon.H/o TBI, seizures, ETOH, cocaine. Admitted for ETOH detox.   Clinical Impression  Pt walked 180' with RW without loss of balance, verbal cues to correct flexed posture. Pt ready to DC home from PT standpoint. Pt stated he has chronic LBP which eased with mobility. PT signing off.     Follow Up Recommendations No PT follow up    Equipment Recommendations  Rolling walker with 5" wheels    Recommendations for Other Services       Precautions / Restrictions Precautions Precautions: Fall Precaution Comments: pt denies h/o falls Restrictions Weight Bearing Restrictions: No      Mobility  Bed Mobility                  Transfers Overall transfer level: Independent Equipment used: None                Ambulation/Gait Ambulation/Gait assistance: Modified independent (Device/Increase time) Ambulation Distance (Feet): 180 Feet Assistive device: Rolling walker (2 wheeled) Gait Pattern/deviations: Trunk flexed;Step-through pattern   Gait velocity interpretation: Below normal speed for age/gender General Gait Details: verbal cues to correct flexed trunk posture, steady, no LOB, LBP eased with walking  Stairs            Wheelchair Mobility    Modified Rankin (Stroke Patients Only)       Balance Overall balance assessment: Modified Independent                                           Pertinent Vitals/Pain Pain Assessment: 0-10 Pain Score: 10-Worst pain ever Pain Location: 10/10 low back pain, pt stated this is chronic, pain decreased to 0/10 with walking Pain Descriptors / Indicators: Sore Pain Intervention(s): Limited  activity within patient's tolerance;Monitored during session    Home Living Family/patient expects to be discharged to:: Private residence Living Arrangements: Spouse/significant other Available Help at Discharge: Family;Available 24 hours/day   Home Access: Stairs to enter   Entrance Stairs-Number of Steps: 1 Home Layout: One level Home Equipment: None      Prior Function Level of Independence: Independent               Hand Dominance        Extremity/Trunk Assessment   Upper Extremity Assessment: Overall WFL for tasks assessed           Lower Extremity Assessment: Overall WFL for tasks assessed      Cervical / Trunk Assessment: Kyphotic  Communication   Communication: No difficulties  Cognition Arousal/Alertness: Awake/alert Behavior During Therapy: WFL for tasks assessed/performed Overall Cognitive Status: Within Functional Limits for tasks assessed                      General Comments      Exercises        Assessment/Plan    PT Assessment Patent does not need any further PT services  PT Diagnosis     PT Problem List    PT Treatment Interventions     PT Goals (Current goals can be found in the Care Plan section)  Acute Rehab PT Goals Patient Stated Goal: fix cars PT Goal Formulation: All assessment and education complete, DC therapy    Frequency     Barriers to discharge        Co-evaluation               End of Session Equipment Utilized During Treatment: Gait belt Activity Tolerance: Patient tolerated treatment well;No increased pain Patient left: in chair;with call bell/phone within reach;with nursing/sitter in room Nurse Communication: Mobility status         Time: 1430-1441 PT Time Calculation (min) (ACUTE ONLY): 11 min   Charges:   PT Evaluation $Initial PT Evaluation Tier I: 1 Procedure     PT G Codes:        [name] 09/10/2015, 2:55 PM [phone]

## 2015-09-10 NOTE — Progress Notes (Signed)
PULMONARY / CRITICAL CARE MEDICINE   Name: Richard [name] MRN: [id number] DOB: 1971-02-20    ADMISSION DATE:  08/25/2015 CONSULTATION DATE:  08/26/2015  REFERRING MD :  Triad  CHIEF COMPLAINT:  Altered mental status  INITIAL PRESENTATION:  44 yo male smoker presented with syncope, HA from HTN emergency (BP 182/116).  Transferred to ICU with ETOH withdrawal.  Hx of HTN, Sz's, Lt leg DVT on xarelto, Cocaine abuse, asthma, remoteTBI.  STUDIES:  9/11 CT head >> Lt frontal gliosis with white matter disease, no acute findings 9/12 EEG >> normal sedated EEG 9/14 scrotal u/s >> b/l hydrocele  SIGNIFICANT EVENTS: 9/11 Admit 9/12 Psych assessment >> involuntary commitment; transfer to ICU for precedex 9/23 precedex dc 9/24 agitated 9/25 precedex infusion restarted 9/26 precedex infusion discontinued in AM, transferred out of ICU  SUBJECTIVE: Richard [name] has been off the precedex infusion since yesterday, his mental status seams to be improved   VITAL SIGNS: Temp:  [97.7 F (36.5 C)-98.4 F (36.9 C)] 98.4 F (36.9 C) (09/27 0504) Pulse Rate:  [63-109] 74 (09/27 0504) Resp:  [13-18] 18 (09/27 0504) BP: (109-144)/(71-104) 136/95 mmHg (09/27 0504) SpO2:  [94 %-100 %] 99 % (09/27 0504) Weight:  [193 lb (87.544 kg)] 193 lb (87.544 kg) (09/26 2203) INTAKE / OUTPUT:  Intake/Output Summary (Last 24 hours) at 09/10/15 1134 Last data filed at 09/10/15 0900  Gross per 24 hour  Intake  303.6 ml  Output   1325 ml  Net -1021.4 ml   PHYSICAL EXAMINATION: General:  Comfortable in bed HENT: NCAT, oropharynx clear PULM: CTA B CV: RRR, no mgr GI: BS+, soft, nontender MSK: normal bulk and tone Derm: no rash or breakdown Psyche: appropriate mood, conversant Neuro: Awake and alert, moving all four ext, oriented to month, time, date, but not to situation  LABS:  CBC  Recent Labs Lab 09/07/15 0239 09/08/15 0313 09/10/15 0453  WBC 8.5 10.5 11.9*  HGB 15.0 15.3 15.7  HCT 46.6 46.4 48.4   PLT 295 276 292   BMET  Recent Labs Lab 09/08/15 0313 09/09/15 0245 09/10/15 0453  NA 137 140 137  K 3.8 4.3 3.8  CL 103 108 100*  CO2 '27 23 29  '$ BUN '15 12 14  '$ CREATININE 0.93 0.69 0.93  GLUCOSE 97 136* 113*   Electrolytes  Recent Labs Lab 09/04/15 0215  09/08/15 0313 09/09/15 0245 09/10/15 0453  CALCIUM 8.7*  < > 8.8* 9.1 9.0  MG 2.1  --   --  1.9 1.7  PHOS 3.6  --   --  2.7 2.8  < > = values in this interval not displayed. Liver Enzymes  Recent Labs Lab 09/06/15 0234 09/10/15 0453  AST 15  --   ALT 14*  --   ALKPHOS 69  --   BILITOT 0.6  --   ALBUMIN 3.0* 3.1*   Glucose  Recent Labs Lab 09/06/15 0802 09/06/15 1143 09/06/15 1555 09/06/15 1943 09/06/15 2330 09/07/15 0325  GLUCAP 71 108* 109* 130* 105* 106*   Imaging No results found.  ASSESSMENT / PLAN:  PULMONARY A: H/O Asthma - no signs of exacerbation Aspiration Pneumonia vs Pneumonitis > resolved Tobacco abuse   P:   Nicotine patch; 7 mg daily PRN albuterol Counsel to quit smoking  CARDIOVASCULAR A:  Syncope - non-focal in setting of hypertensive emergency Hypertensive Emergency resolved  P:  PRN labetalol, hydralazine Clonidine oral 0.3 three times a day > would try to wean off if able given high likelihood of  rebound Change Metoprolol to labetalol '200mg'$  po bid (would prefer to avoid non-selective B-blockers with cocaine use) Continue scheduled hydralazine Consider adding amlodipine tomorrow  RENAL A:   Hypokalemia - resolved Acute urine retention - 800 ml noted on bladder scan 9/13 -foley > resolved Scrotal swelling - b/l hydrocele on ultrasound  P:  NS at [facility] BMET prn  GASTROINTESTINAL A:   H/O GERD  P:   PO diet per recommendations of SLP Protonix PO daily  HEMATOLOGIC A:   H/O LLE DVT - March 2016 during hospitalization w/ prolonged bedrest > resolved  P:  Monitor Hgb daily on CBC D/C Xarelto Lovenox '40mg'$  [place] q24hr SCDs  INFECTIOUS A:   Aspiration  Pneumonia vs. Pneumonitis - resolved P:   Empiric unasyn 9/16 >>9/23 Monitor for leukocytosis Pan-culture for fever >101.73F  ENDOCRINE A:   Hyperglycemia  P:   Monitor w/ daily electrolyte panel  NEUROLOGIC A:   Acute encephalopathy - Secondary to hypertensive emergency & ETOH withdrawal with delirium tremens > resolved; now awake and alert, oriented x4, conversant H/O polysubstance abuse H/O seizures. H/O TBI with baseline cognitive deficits and explosiveness> per psyche needs outpatient psyche follow up   P:   Seroquel '200mg'$  po bid Klonopin 0.'5mg'$  bid Haldol IV prn Keppra PO bid Thiamine, folic acid  TODAY'S SUMMARY: dramatic improvement overnight, wants to leave; I tried to call his wife to discuss further, but I could not reach her with both numbers listed  Roselie Awkward, MD [place] PCCM Pager: [phone] Cell: [phone] After 3pm or if no response, call [phone]

## 2015-09-10 NOTE — Discharge Summary (Signed)
Physician Discharge Summary       Patient ID: Richard [name] MRN: [id number] DOB/AGE: March 26, 1971 44 y.o.  Admit date: 08/25/2015 Discharge date: 09/10/2015  Discharge Diagnoses:  Principal Problem:   Acute encephalopathy Active Problems:   TOBACCO ABUSE   Convulsions   Hypertensive urgency   Lower leg DVT (deep venous thromboembolism), acute   Syncope   Atelectasis   Swollen testicle    History of Present Illness: 44 year old male with PMH as below, which includes HTN, EtOH abuse with recent prolonged detox in past requiring 2 weeks precedex, remote hx of cocaine abuse, asthma, seizures, DVT on xarelto, and remote TBI. He presented to ED 9/11 after syncopal episode at home earlier that day. He did not feel well and noted high BP, tried to correct this himself at home, but was unable to do so. He complained of headache, became confused, and passed out per wife. She called EMS and he was transported to ED. In ED he was noted to by hypertensive with SBP 182/116. Mental status was noted to be intact at that time. Of note EtOH was elevated.  Hospital Course:  He was admitted by the hospitalist team and treated with IV labetalol and PO clonidine in addition to his home meds of amlodipine and hydralazine. 9/12 early AM he started having some alterations in his mental status, starting with slurring of speech and progressing to agitation and confusion requiring chemical and mechanical restraints. This was refractory to several doses of Ativan and 5 mg haldol. This was presumable related to EtOH withdrawal vs hypertensive encephalopathy and he was moved to ICU and started on Precedex infusion. Hospital course continued to be complicated by encephalopathy with agitation. He required Precedex off and on for about 2 weeks despite adequate blood pressure control. During that time EEG showed a normal sedated pattern (on Precedex). He remained in ICU for this reason for over 2 weeks. Intermittently on  Precedex and restrained despite adjustments to oral anti-psychotics and psych meds. 9/26 he had improved to the point that he was moved to the floor. 9/27 on AM round he was requesting to be sent home. He was alert, oriented, and not agitated in any way. He had been off precedex for over 24 hours with no issues. It was deemed that he could be a candidate for discharge pending PT eval and behavioral health blessing. Behavioral health does not feel that he need inpatient psychiatric care. PT eval requires no outpatient PT needs. Ok to discharge.     Discharge Plan by active problems   H/O Asthma - no signs of exacerbation Aspiration Pneumonia vs Pneumonitis > resolved Tobacco abuse  -Can use OTC Nicotine patch -PRN albuterol inhaler -Counsel to quit smoking  Hypertensive Emergency > resolved HTN -PRN labetalol, hydralazine -Clonidine oral 0.3 three times a day > would try to wean off if able given high likelihood of rebound -Change Metoprolol to labetalol '200mg'$  po bid (would prefer to avoid non-selective B-blockers with cocaine use) -Continue scheduled hydralazine -Consider adding amlodipine tomorrow  Scrotal swelling - b/l hydrocele on ultrasound - Follow up with PCP  H/O LLE DVT - March 2016 during hospitalization w/ prolonged bedrest > resolved - Xarelto completed   Aspiration Pneumonia vs. Pneumonitis - resolved - ABX completed  Acute encephalopathy - Secondary to hypertensive emergency & ETOH withdrawal with delirium tremens > resolved; now awake and alert, oriented x4, conversant H/O polysubstance abuse H/O seizures. H/O TBI with baseline cognitive deficits and explosiveness> per psyche needs  outpatient psyche follow up  -Seroquel '200mg'$  po bid -Klonopin 0.'5mg'$  bid -Keppra PO bid    Significant Hospital tests/ studies   STUDIES:  9/11 CT head >> Lt frontal gliosis with white matter disease, no acute findings 9/12 EEG >> normal sedated EEG 9/14 scrotal u/s >> b/l  hydrocele  SIGNIFICANT EVENTS: 9/11 Admit 9/12 Psych assessment >> involuntary commitment; transfer to ICU for precedex 9/23 precedex dc 9/24 agitated 9/25 precedex infusion restarted 9/26 precedex infusion discontinued in AM, transferred out of ICU  Consults  - Psychiatry Discharge Exam: BP 136/95 mmHg  Pulse 74  Temp(Src) 98.4 F (36.9 C) (Oral)  Resp 18  Ht '5\' 9"'$  (1.753 m)  Wt 87.544 kg (193 lb)  BMI 28.49 kg/m2  SpO2 99%  PHYSICAL EXAMINATION: General: Comfortable in bed HENT: NCAT, oropharynx clear PULM: CTA B CV: RRR, no mgr GI: BS+, soft, nontender MSK: normal bulk and tone Derm: no rash or breakdown Psyche: appropriate mood, conversant Neuro: Awake and alert, moving all four ext, oriented to month, time, date, but not to situation  Labs at discharge Lab Results  Component Value Date   CREATININE 0.93 09/10/2015   BUN 14 09/10/2015   NA 137 09/10/2015   K 3.8 09/10/2015   CL 100* 09/10/2015   CO2 29 09/10/2015   Lab Results  Component Value Date   WBC 11.9* 09/10/2015   HGB 15.7 09/10/2015   HCT 48.4 09/10/2015   MCV 94.9 09/10/2015   PLT 292 09/10/2015   Lab Results  Component Value Date   ALT 14* 09/06/2015   AST 15 09/06/2015   ALKPHOS 69 09/06/2015   BILITOT 0.6 09/06/2015   Lab Results  Component Value Date   INR 1.00 02/11/2015   INR 0.87 12/22/2013   INR 0.96 06/19/2013    Current radiology studies No results found.  Disposition:  01-Home or Self Care      Discharge Instructions    Call MD for:  difficulty breathing, headache or visual disturbances    Complete by:  As directed      Call MD for:  persistant nausea and vomiting    Complete by:  As directed      Call MD for:  temperature >100.4    Complete by:  As directed      Diet - low sodium heart healthy    Complete by:  As directed      Increase activity slowly    Complete by:  As directed             Medication List    STOP taking these medications         amLODipine 10 MG tablet  Commonly known as:  NORVASC     amoxicillin-clavulanate 875-125 MG tablet  Commonly known as:  AUGMENTIN     atenolol 50 MG tablet  Commonly known as:  TENORMIN     furosemide 40 MG tablet  Commonly known as:  LASIX     GOODY HEADACHE PO     metoprolol tartrate 25 MG tablet  Commonly known as:  LOPRESSOR     Rivaroxaban 15 MG Tabs tablet  Commonly known as:  XARELTO     rivaroxaban 20 MG Tabs tablet  Commonly known as:  XARELTO      TAKE these medications        clonazePAM 0.5 MG tablet  Commonly known as:  KLONOPIN  Take 1 tablet (0.5 mg total) by mouth 2 (two) times daily.  cloNIDine 0.3 MG tablet  Commonly known as:  CATAPRES  Take 1 tablet (0.3 mg total) by mouth 3 (three) times daily.     folic acid 1 MG tablet  Commonly known as:  FOLVITE  Take 1 tablet (1 mg total) by mouth daily.     hydrALAZINE 50 MG tablet  Commonly known as:  APRESOLINE  Take 1 tablet (50 mg total) by mouth every 8 (eight) hours.     labetalol 200 MG tablet  Commonly known as:  NORMODYNE  Take 1 tablet (200 mg total) by mouth 2 (two) times daily.     levETIRAcetam 500 MG tablet  Commonly known as:  KEPPRA  Take 1 tablet (500 mg total) by mouth 2 (two) times daily.     nicotine 21 mg/24hr patch  Commonly known as:  NICODERM CQ - dosed in mg/24 hours  Place 1 patch (21 mg total) onto the skin daily.     omeprazole 40 MG capsule  Commonly known as:  PRILOSEC  Take 40 mg by mouth daily.     QUEtiapine 200 MG tablet  Commonly known as:  SEROQUEL  Take 1 tablet (200 mg total) by mouth 2 (two) times daily.     thiamine 100 MG tablet  Take 1 tablet (100 mg total) by mouth daily.       Follow-up Information    Follow up with [facility] on 09/10/2015.   Why:  [employer] card, proof of income and photo ID, medication managment   Contact information:   [place] [phone] Thursday, September 29 1:40pm initial  assessment IOP/med management       Follow up with [facility].   Why:  rolling walker to be delivered to room prior to discharge   Contact information:   [address] [place] [address] [phone]       Follow up with Primary Care Provider. Schedule an appointment as soon as possible for a visit in 1 week.      Discharged Condition: good  Greater than 30 minutes of time have been dedicated to discharge assessment, planning and discharge instructions.   Signed: Georgann Housekeeper, AGACNP-BC [place] [facility]/Critical [facility] Pager [phone] or [phone]  09/10/2015 4:08 PM

## 2015-09-10 NOTE — Progress Notes (Signed)
Left message with psych SW Gena to verify patient ready for discharge with follow up appointment placed. Follow up in [facility] at [facility] recorded, call not returned.

## 2015-09-10 NOTE — Care Management Important Message (Signed)
Important Message  Patient Details  Name: Richard [name] MRN: [id number] Date of Birth: 02/05/1971   Medicare Important Message Given:  Yes-fourth notification given    Nathen May 09/10/2015, 10:58 AM

## 2015-09-10 NOTE — Progress Notes (Signed)
Pharmacist Provided - Patient Medication Education Prior to Discharge   Richard [name] is an 44 y.o. male who presented to [facility] on 08/25/2015 with a chief complaint of  Chief Complaint  Patient presents with  . Hypoglycemia     '[x]'$  Patient will be discharged with 1 new medications '[]'$  Patient being discharged without any new medications  The following medications were discussed with the patient: labetalol, clonidine, hydralazine, amlodipine, metoprolol, atenolol, Xarelto   Pain Control medications: '[]'$  Yes    '[x]'$  No  Diabetes Medications: '[]'$  Yes    '[x]'$  No  Heart Failure Medications: '[]'$  Yes    '[x]'$  No  Anticoagulation Medications:  '[x]'$  Yes    '[]'$  No  Antibiotics at discharge: '[]'$  Yes    '[x]'$  No  Allergy Assessment Completed and Updated: '[x]'$  Yes    '[]'$  No Identified Patient Allergies:  Allergies  Allergen Reactions  . Dilaudid [Hydromorphone Hcl] Other (See Comments)    Bradycardia and Nausea, hyperthermia     Medication Adherence Assessment: '[]'$  Excellent (no doses missed/week)      '[]'$  Good (1 dose missed/week)      '[]'$  Partial (2-3 doses missed/week)      '[x]'$  Poor (>3 doses missed/week)  Barriers to Obtaining Medications: '[]'$  Yes '[x]'$  No   Assessment: 44 yo M admitted with hypertensive urgency due to poor compliance with medications. We discussed changes to his blood pressure medications, which medications to stop taking and which medications were new. We discussed the importance of medication compliance as a way to stay out of the hospital and prevent potential stroke. I also discussed with him new prescriptions that were ready to be picked up from his pharmacy. I advised him to compare all of his medication bottles to his new medication list and to only take medications that were present on his list. I also advised him to take this medication list with him to all his upcoming follow-up appointments. I went over discontinued medications which included Xarelto, metoprolol, atenolol,  amlodipine, Goody's powder and Lasix. Patient seems motivated to increase his compliance especially after this hospital admission. I encouraged him to call us if he has any questions and to take his medications + list to his local pharmacy if he needs any help determining what medications he is supposed to continue taking.  Time spent preparing for discharge counseling: 10 min Time spent counseling patient: 15 min  Governor Specking, PharmD Clinical Pharmacy Resident Pager: [phone]  09/10/2015, 4:33 PM

## 2015-09-10 NOTE — Progress Notes (Signed)
Critical Care MD Elsworth Soho paged to coordinate patient plan of care and discharge plan.

## 2015-09-10 NOTE — Progress Notes (Addendum)
NURSING PROGRESS NOTE  Richard [name] [id number] Discharge Data: 09/10/2015 4:19 PM Attending Provider: Javier Glazier, MD [id number], [name], MD   [name] to be D/C'd Home per MD order with walker provided by PT, and all prescriptions sent to patient preferred pharmacy. Patient verbalized understanding of discharge instructions including medication adherence, and follow up care with PCP in one week. Medication teaching done by myself as well as pharmacist [name] at bedside. Patient refused to be taken out in wheelchair, walked out with walker accompanied by wife.    All IV's will be discontinued and monitored for bleeding.  All belongings will be returned to patient for patient to take home.  Last Documented Vital Signs:  Blood pressure 136/95, pulse 74, temperature 98.4 F (36.9 C), temperature source Oral, resp. rate 18, height '5\' 9"'$  (1.753 m), weight 87.544 kg (193 lb), SpO2 99 %.  [name] RN, BS, BSN

## 2015-09-10 NOTE — Clinical Social Work Psych Note (Signed)
Psych CSW discussed patient's disposition with MD.  Psych CSW was contacted re: expiration of patient's involuntary commitment (IVC) paperwork.  Psych CSW consulted with MD who states that patient is alert and oriented today and no need for IVC to be continued.  Psychiatry recommends outpatient follow-up at time of discharge.  Psych CSW reviewed this recommendation with patient and wife who are both agreeable to follow up for medication management.  [facility] [place] [phone] Thursday, September 29 1:40pm initial assessment IOP/med management   Psych CSW signing off.

## 2015-09-10 NOTE — Progress Notes (Addendum)
Per MD Lake Bells, NP [facility] [place] is working on patient discharge, NP [facility] [place] contacted by MD Lake Bells to check on status of d/c paperwork.

## 2015-09-10 NOTE — Progress Notes (Signed)
Per MD McQuaid patient ready for d/c, PT and Case Management (for home medications and resources) consulted and ordered for imminent discharge. Psych Social Worker Rollene Fare is Financial risk analyst.

## 2015-09-10 NOTE — Care Management Note (Addendum)
Case Management Note  Patient Details  Name: Richard [name] MRN: [id number] Date of Birth: [birth year]  Subjective/Objective:                  Date: 09-10-15 Tuesday Spoke with patient at the bedside. Introduced self as Tourist information centre manager and explained role in discharge planning and how to be reached. Verified patient lives in [place], in home, with spouse. Verified patient anticipates to go home with family,  at time of discharge and will have full-time supervision by family at this time to best of their knowledge. Patient has no DME. PT eval recommended need for rolling walker, ordered through [facility], and delivered to room. Patient denied  needing help with paying for  Medication and either he or his wife can pick them up at pharmacy. Patient given Xarelto 30 day and 1 year prescription card. Patient drives to MD appointments. Verified patient has PCP Dr Ellard Artis Blunt on [facility].  Patient states they currently receive [place] services through no one.    Plan: CM will continue to follow for discharge planning and [facility] resources.   Carles Collet RN BSN CM [phone]   Action/Plan:  Provided walker prior to discharge. Spoke with patient's wife as well, she had no further needs or questions. Patien to follow up at [facility].  Expected Discharge Date:  08/28/15               Expected Discharge Plan:  Home/Self Care  In-House Referral:  Clinical Social Work  Discharge planning Services  CM Consult  Post Acute Care Choice:    Choice offered to:     DME Arranged:    DME Agency:     HH Arranged:    HH Agency:     Status of Service:  Completed, signed off  Medicare Important Message Given:  Yes-fourth notification given Date Medicare IM Given:    Medicare IM give by:    Date Additional Medicare IM Given:    Additional Medicare Important Message give by:     If discussed at [place] of Stay Meetings, dates discussed:    Additional Comments:  Carles Collet, RN 09/10/2015, 2:40 PM

## 2017-01-02 ENCOUNTER — Encounter (HOSPITAL_COMMUNITY): Payer: Self-pay

## 2017-01-02 ENCOUNTER — Emergency Department (HOSPITAL_COMMUNITY): Payer: Medicare Other

## 2017-01-02 ENCOUNTER — Emergency Department (HOSPITAL_COMMUNITY)
Admission: EM | Admit: 2017-01-02 | Discharge: 2017-01-02 | Disposition: A | Discharge status: Home / Self Care | Payer: Medicare Other | Attending: Emergency Medicine | Admitting: Emergency Medicine

## 2017-01-02 DIAGNOSIS — Z79899 Other long term (current) drug therapy: Secondary | ICD-10-CM | POA: Insufficient documentation

## 2017-01-02 DIAGNOSIS — G4489 Other headache syndrome: Secondary | ICD-10-CM | POA: Diagnosis not present

## 2017-01-02 DIAGNOSIS — F17210 Nicotine dependence, cigarettes, uncomplicated: Secondary | ICD-10-CM | POA: Insufficient documentation

## 2017-01-02 DIAGNOSIS — I10 Essential (primary) hypertension: Secondary | ICD-10-CM | POA: Diagnosis not present

## 2017-01-02 DIAGNOSIS — R030 Elevated blood-pressure reading, without diagnosis of hypertension: Secondary | ICD-10-CM | POA: Diagnosis not present

## 2017-01-02 DIAGNOSIS — R0689 Other abnormalities of breathing: Secondary | ICD-10-CM | POA: Diagnosis not present

## 2017-01-02 DIAGNOSIS — Z8673 Personal history of transient ischemic attack (TIA), and cerebral infarction without residual deficits: Secondary | ICD-10-CM | POA: Insufficient documentation

## 2017-01-02 DIAGNOSIS — R05 Cough: Secondary | ICD-10-CM | POA: Diagnosis not present

## 2017-01-02 DIAGNOSIS — R519 Headache, unspecified: Secondary | ICD-10-CM

## 2017-01-02 DIAGNOSIS — J45909 Unspecified asthma, uncomplicated: Secondary | ICD-10-CM | POA: Diagnosis not present

## 2017-01-02 DIAGNOSIS — R51 Headache: Secondary | ICD-10-CM | POA: Diagnosis not present

## 2017-01-02 LAB — CBC WITH DIFFERENTIAL/PLATELET
BASOS ABS: 0 10*3/uL (ref 0.0–0.1)
Basophils Relative: 0 %
EOS PCT: 0 %
Eosinophils Absolute: 0 10*3/uL (ref 0.0–0.7)
HEMATOCRIT: 45.7 % (ref 39.0–52.0)
HEMOGLOBIN: 15.8 g/dL (ref 13.0–17.0)
LYMPHS PCT: 24 %
Lymphs Abs: 2.9 10*3/uL (ref 0.7–4.0)
MCH: 31.3 pg (ref 26.0–34.0)
MCHC: 34.6 g/dL (ref 30.0–36.0)
MCV: 90.5 fL (ref 78.0–100.0)
Monocytes Absolute: 0.9 10*3/uL (ref 0.1–1.0)
Monocytes Relative: 7 %
NEUTROS ABS: 8.3 10*3/uL — AB (ref 1.7–7.7)
Neutrophils Relative %: 69 %
Platelets: 207 10*3/uL (ref 150–400)
RBC: 5.05 MIL/uL (ref 4.22–5.81)
RDW: 14.7 % (ref 11.5–15.5)
WBC: 12 10*3/uL — AB (ref 4.0–10.5)

## 2017-01-02 LAB — COMPREHENSIVE METABOLIC PANEL
ALBUMIN: 3.8 g/dL (ref 3.5–5.0)
ALT: 19 U/L (ref 17–63)
ANION GAP: 8 (ref 5–15)
AST: 18 U/L (ref 15–41)
Alkaline Phosphatase: 63 U/L (ref 38–126)
BILIRUBIN TOTAL: 0.4 mg/dL (ref 0.3–1.2)
BUN: 10 mg/dL (ref 6–20)
CHLORIDE: 111 mmol/L (ref 101–111)
CO2: 20 mmol/L — ABNORMAL LOW (ref 22–32)
Calcium: 8.7 mg/dL — ABNORMAL LOW (ref 8.9–10.3)
Creatinine, Ser: 0.77 mg/dL (ref 0.61–1.24)
GFR calc Af Amer: >60 mL/min (ref >60)
Glucose, Bld: 90 mg/dL (ref 65–99)
POTASSIUM: 3.9 mmol/L (ref 3.5–5.1)
Sodium: 139 mmol/L (ref 135–145)
TOTAL PROTEIN: 6.8 g/dL (ref 6.5–8.1)

## 2017-01-02 LAB — TROPONIN I: Troponin I: <0.03 ng/mL (ref <0.03)

## 2017-01-02 LAB — BRAIN NATRIURETIC PEPTIDE: B Natriuretic Peptide: 26.9 pg/mL (ref 0.0–100.0)

## 2017-01-02 MED ORDER — METOCLOPRAMIDE HCL 10 MG PO TABS: 10.0000 mg | ORAL_TABLET | Freq: Four times a day (QID) | ORAL | 0 refills | Status: DC | PRN

## 2017-01-02 MED ORDER — METOCLOPRAMIDE HCL 5 MG/ML IJ SOLN
10.0000 mg | Freq: Once | INTRAMUSCULAR | Status: AC
  Administered 2017-01-02: 10 mg via INTRAVENOUS
  Filled 2017-01-02: qty 2

## 2017-01-02 MED ORDER — DIPHENHYDRAMINE HCL 50 MG/ML IJ SOLN
25.0000 mg | Freq: Once | INTRAMUSCULAR | Status: AC
  Administered 2017-01-02: 25 mg via INTRAVENOUS
  Filled 2017-01-02: qty 1

## 2017-01-02 NOTE — ED Provider Notes (Signed)
[facility] DEPT Provider Note   CSN: [id number] Arrival date & time: 01/02/17  0209    By signing my name below, I, Macon Large, attest that this documentation has been prepared under the direction and in the presence of Delora Fuel, MD. Electronically Signed: Macon Large, ED Scribe. 01/02/17. 3:22 AM.  History   Chief Complaint Chief Complaint  Patient presents with  . Headache   The history is provided by the patient. No language interpreter was used.   HPI Comments: Richard [name] is a 46 y.o. male with PMHx of HTN, seizures, brain bleed and asthma brought in by ambulance who presents to the Emergency Department complaining of 9/10, moderate, constant, HA onset today. He reports associated blurry vision. Per nurse note, pt states he took some BC powder with minimal relief. Pt notes he has a hx of headaches. He states his HA is worsened with photophobia. He also notes his blood pressure has been elevated all day today. Pt reports a recorded level of "202 over 120 something" at home. No alleviating factors noted. Denies chest pain.   Past Medical History:  Diagnosis Date  . Asthma   . Brain bleed ([place])   . Hypertension   . Seizures ([place])    last episode 03/2013    Patient Active Problem List   Diagnosis Date Noted  . Swollen testicle   . Atelectasis   . Syncope 08/26/2015  . Acute encephalopathy 08/26/2015  . Seizures ([place])   . Acute pulmonary edema (HCC)   . Lower leg DVT (deep venous thromboembolism), acute ([place])   . Protein-calorie malnutrition, severe ([place]) 02/19/2015  . Bacteremia due to Staphylococcus 02/17/2015  . Alcohol withdrawal delirium ([place])   . Acute respiratory failure with hypoxia ([place]) 02/13/2015  . Acute respiratory failure with hypoxemia ([place])   . Altered mental status   . Metabolic acidosis   . Essential hypertension   . Encephalopathy 02/11/2015  . Alcohol intoxication ([place]) 02/11/2015  . Lactic acidosis 02/11/2015  . Bleeding from  the nose 03/19/2014  . TIA (transient ischemic attack) 12/24/2013  . Weakness 12/23/2013  . Left-sided weakness 12/23/2013  . Malignant hypertension 12/23/2013  . Seizure disorder ([place]) 12/23/2013  . Vomiting 06/18/2013  . Chest pain 06/18/2013  . Hypertensive urgency 10/30/2012  . Migraine variant 10/30/2012  . TOBACCO ABUSE 01/11/2008  . HYPERTENSION, BENIGN 11/24/2007  . CHRONIC FRONTAL SINUSITIS 11/24/2007  . Convulsions ([place]) 11/24/2007  . Headache(784.0) 11/24/2007    Past Surgical History:  Procedure Laterality Date  . LEG SURGERY         Home Medications    Prior to Admission medications   Medication Sig Start Date End Date Taking? Authorizing Provider  clonazePAM (KLONOPIN) 0.5 MG tablet Take 1 tablet (0.5 mg total) by mouth 2 (two) times daily. 03/01/15   Reyne Dumas, MD  cloNIDine (CATAPRES) 0.3 MG tablet Take 1 tablet (0.3 mg total) by mouth 3 (three) times daily. 09/10/15   Corey Harold, NP  folic acid (FOLVITE) 1 MG tablet Take 1 tablet (1 mg total) by mouth daily. 03/01/15   Reyne Dumas, MD  hydrALAZINE (APRESOLINE) 50 MG tablet Take 1 tablet (50 mg total) by mouth every 8 (eight) hours. 03/20/14   Charlynne Cousins, MD  labetalol (NORMODYNE) 200 MG tablet Take 1 tablet (200 mg total) by mouth 2 (two) times daily. 09/10/15   Corey Harold, NP  levETIRAcetam (KEPPRA) 500 MG tablet Take 1 tablet (500 mg total) by mouth 2 (two) times daily.  03/01/15   Reyne Dumas, MD  nicotine (NICODERM CQ - DOSED IN MG/24 HOURS) 21 mg/24hr patch Place 1 patch (21 mg total) onto the skin daily. Patient not taking: Reported on 08/25/2015 03/01/15   Reyne Dumas, MD  omeprazole (PRILOSEC) 40 MG capsule Take 40 mg by mouth daily. 01/22/15   Historical Provider, MD  QUEtiapine (SEROQUEL) 200 MG tablet Take 1 tablet (200 mg total) by mouth 2 (two) times daily. 09/10/15   Corey Harold, NP  thiamine 100 MG tablet Take 1 tablet (100 mg total) by mouth daily. 03/01/15   Reyne Dumas, MD     Family History Family History  Problem Relation Age of Onset  . Diabetes Mellitus II Sister   . Cancer Father     Social History Social History  Substance Use Topics  . Smoking status: Current Every Day Smoker    Packs/day: 1.00    Years: 26.00    Types: Cigarettes  . Smokeless tobacco: Never Used  . Alcohol use 2.4 oz/week    4 Cans of beer per week     Comment: drinks heavily. "pt sts he only drinks one beer a day now"     Allergies   Dilaudid [hydromorphone hcl]   Review of Systems Review of Systems  Eyes: Positive for visual disturbance.  Cardiovascular: Negative for chest pain.  Neurological: Positive for headaches.  All other systems reviewed and are negative.    Physical Exam Updated Vital Signs BP (!) 175/122 (BP Location: Left Arm)   Pulse 86   Temp 97.8 F (36.6 C) (Oral)   Resp 19   SpO2 (!) 20%   Physical Exam  Constitutional: He is oriented to person, place, and time. He appears well-developed and well-nourished.  HENT:  Head: Normocephalic and atraumatic.  Eyes: EOM are normal. Pupils are equal, round, and reactive to light.   No hemorrhage, exudate or palpable edema.   Neck: Normal range of motion. Neck supple. No JVD present.  Cardiovascular: Normal rate, regular rhythm and normal heart sounds.   No murmur heard. Pulmonary/Chest: Effort normal and breath sounds normal. He has no wheezes. He has no rales. He exhibits no tenderness.  Abdominal: Soft. Bowel sounds are normal. He exhibits no distension and no mass. There is no tenderness.  Musculoskeletal: Normal range of motion. He exhibits no edema.  Lymphadenopathy:    He has no cervical adenopathy.  Neurological: He is alert and oriented to person, place, and time. No cranial nerve deficit. He exhibits normal muscle tone. Coordination normal.  Skin: Skin is warm and dry. No rash noted.  Psychiatric: He has a normal mood and affect. His behavior is normal. Judgment and thought content  normal.  Nursing note and vitals reviewed.    ED Treatments / Results   DIAGNOSTIC STUDIES: Oxygen Saturation is 20% on RA, low by my interpretation.    COORDINATION OF CARE: 3:12 AM Discussed treatment plan with pt at bedside which includes labs and imaging and pt agreed to plan.   Labs (all labs ordered are listed, but only abnormal results are displayed) Labs Reviewed  COMPREHENSIVE METABOLIC PANEL - Abnormal; Notable for the following:       Result Value   CO2 20 (*)    Calcium 8.7 (*)    All other components within normal limits  CBC WITH DIFFERENTIAL/PLATELET - Abnormal; Notable for the following:    WBC 12.0 (*)    Neutro Abs 8.3 (*)    All other components within normal  limits  BRAIN NATRIURETIC PEPTIDE  TROPONIN I    EKG  EKG Interpretation  Date/Time:  Saturday January 02 2017 03:40:02 EST Ventricular Rate:  85 PR Interval:    QRS Duration: 94 QT Interval:  381 QTC Calculation: 453 R Axis:   68 Text Interpretation:  Sinus rhythm Biatrial enlargement Left ventricular hypertrophy Anterior Q waves, possibly due to LVH ST elevation, consider inferior injury When compared with ECG of 02/11/2015, Left ventricular hypertrophy is now present Confirmed by [facility]  MD, Kismet Facemire ([id number]) on 01/02/2017 3:42:48 AM       Radiology Dg Chest 2 View  Result Date: 01/02/2017 CLINICAL DATA:  46 y/o  M; cough. EXAM: CHEST  2 VIEW COMPARISON:  08/30/2015 chest radiograph FINDINGS: The heart size and mediastinal contours are within normal limits and stable. Both lungs are clear. The visualized skeletal structures are unremarkable. IMPRESSION: No active cardiopulmonary disease. Electronically Signed   By: Kristine Garbe M.D.   On: 01/02/2017 04:01    Procedures Procedures (including critical care time)  Medications Ordered in ED Medications  metoCLOPramide (REGLAN) injection 10 mg (not administered)  diphenhydrAMINE (BENADRYL) injection 25 mg (not administered)      Initial Impression / Assessment and Plan / ED Course  I have reviewed the triage vital signs and the nursing notes.  Pertinent labs & imaging results that were available during my care of the patient were reviewed by me and considered in my medical decision making (see chart for details).  Headache and severe hypertension. Old records are reviewed, and all recorded blood pressures I can find have been in the same range. Because this is not a significant change in his blood pressure, I suspect his headache is not related to the blood pressure. He is given a dose of metoclopramide. Following this, he has fallen asleep and blood pressures come down to 147/91. Headache is dramatically improved. Laboratory workup shows no evidence of worsening renal function. He does have symptoms of exertional dyspnea and ECG shows changes of left ventricular hypertrophy. Patient was encouraged to make sure that he keeps his blood pressure under the tightest control possible to prevent development of CHF, coronary artery disease, stroke, renal failure. Patient expresses understanding.  Final Clinical Impressions(s) / ED Diagnoses   Final diagnoses:  Headache, unspecified headache type  Essential hypertension    New Prescriptions New Prescriptions   METOCLOPRAMIDE (REGLAN) 10 MG TABLET    Take 1 tablet (10 mg total) by mouth every 6 (six) hours as needed for nausea (or headache).    I personally performed the services described in this documentation, which was scribed in my presence. The recorded information has been reviewed and is accurate.       Delora Fuel, MD 85/90/93 [id number]

## 2017-01-02 NOTE — Discharge Instructions (Signed)
Work with your doctor to keep your blood pressure under control. Uncontrolled high blood pressure will lead to heart attack, heart failure, stroke, or kidney failure.

## 2017-01-02 NOTE — ED Notes (Addendum)
Pt refused blood draw.  Pt sts " I'll do anything you ask, but I dont do needles and I dont let anyone touch me with them." Pt informed that blood work was ordered by EDP to determine care.  Pt still sts, "I don't do needles maam."  RN notified.

## 2017-01-02 NOTE — ED Triage Notes (Signed)
PT BIB [facility] from home c/o headache all day. He states that he got in to an argument with someone and it got worse. States that he took [facility] powder at home. Pt endorses 1 beer tonight. A&Ox4.

## 2017-03-22 DIAGNOSIS — J019 Acute sinusitis, unspecified: Secondary | ICD-10-CM | POA: Diagnosis not present

## 2017-03-22 DIAGNOSIS — R2243 Localized swelling, mass and lump, lower limb, bilateral: Secondary | ICD-10-CM | POA: Diagnosis not present

## 2017-03-22 DIAGNOSIS — I10 Essential (primary) hypertension: Secondary | ICD-10-CM | POA: Diagnosis not present

## 2017-04-02 ENCOUNTER — Encounter (HOSPITAL_COMMUNITY): Payer: Self-pay

## 2017-04-02 ENCOUNTER — Emergency Department (HOSPITAL_COMMUNITY)
Admission: EM | Admit: 2017-04-02 | Discharge: 2017-04-03 | Disposition: A | Discharge status: Home / Self Care | Payer: Medicare Other | Attending: Emergency Medicine | Admitting: Emergency Medicine

## 2017-04-02 ENCOUNTER — Emergency Department (HOSPITAL_COMMUNITY): Payer: Medicare Other

## 2017-04-02 DIAGNOSIS — F10120 Alcohol abuse with intoxication, uncomplicated: Secondary | ICD-10-CM | POA: Insufficient documentation

## 2017-04-02 DIAGNOSIS — Z79899 Other long term (current) drug therapy: Secondary | ICD-10-CM | POA: Insufficient documentation

## 2017-04-02 DIAGNOSIS — I10 Essential (primary) hypertension: Secondary | ICD-10-CM | POA: Insufficient documentation

## 2017-04-02 DIAGNOSIS — Z8673 Personal history of transient ischemic attack (TIA), and cerebral infarction without residual deficits: Secondary | ICD-10-CM | POA: Diagnosis not present

## 2017-04-02 DIAGNOSIS — R4182 Altered mental status, unspecified: Secondary | ICD-10-CM | POA: Diagnosis not present

## 2017-04-02 DIAGNOSIS — Y906 Blood alcohol level of 120-199 mg/100 ml: Secondary | ICD-10-CM | POA: Diagnosis not present

## 2017-04-02 DIAGNOSIS — F10920 Alcohol use, unspecified with intoxication, uncomplicated: Secondary | ICD-10-CM

## 2017-04-02 DIAGNOSIS — J45909 Unspecified asthma, uncomplicated: Secondary | ICD-10-CM | POA: Diagnosis not present

## 2017-04-02 DIAGNOSIS — F17210 Nicotine dependence, cigarettes, uncomplicated: Secondary | ICD-10-CM | POA: Diagnosis not present

## 2017-04-02 DIAGNOSIS — R451 Restlessness and agitation: Secondary | ICD-10-CM | POA: Insufficient documentation

## 2017-04-02 LAB — RAPID URINE DRUG SCREEN, HOSP PERFORMED
AMPHETAMINES: NOT DETECTED
BARBITURATES: NOT DETECTED
BENZODIAZEPINES: POSITIVE — AB
Cocaine: NOT DETECTED
Opiates: NOT DETECTED
Tetrahydrocannabinol: NOT DETECTED

## 2017-04-02 LAB — ACETAMINOPHEN LEVEL

## 2017-04-02 LAB — COMPREHENSIVE METABOLIC PANEL
ALBUMIN: 4 g/dL (ref 3.5–5.0)
ALK PHOS: 71 U/L (ref 38–126)
ALT: 14 U/L — ABNORMAL LOW (ref 17–63)
AST: 18 U/L (ref 15–41)
Anion gap: 10 (ref 5–15)
BILIRUBIN TOTAL: 0.6 mg/dL (ref 0.3–1.2)
BUN: 11 mg/dL (ref 6–20)
CALCIUM: 8.9 mg/dL (ref 8.9–10.3)
CO2: 23 mmol/L (ref 22–32)
CREATININE: 0.98 mg/dL (ref 0.61–1.24)
Chloride: 109 mmol/L (ref 101–111)
GFR calc Af Amer: >60 mL/min (ref >60)
GFR calc non Af Amer: >60 mL/min (ref >60)
GLUCOSE: 101 mg/dL — AB (ref 65–99)
Potassium: 3.7 mmol/L (ref 3.5–5.1)
SODIUM: 142 mmol/L (ref 135–145)
Total Protein: 7.5 g/dL (ref 6.5–8.1)

## 2017-04-02 LAB — SALICYLATE LEVEL: Salicylate Lvl: <7 mg/dL (ref 2.8–30.0)

## 2017-04-02 LAB — CBC WITH DIFFERENTIAL/PLATELET
BASOS ABS: 0 10*3/uL (ref 0.0–0.1)
BASOS PCT: 0 %
EOS ABS: 0 10*3/uL (ref 0.0–0.7)
Eosinophils Relative: 0 %
HCT: 48 % (ref 39.0–52.0)
HEMOGLOBIN: 16.8 g/dL (ref 13.0–17.0)
Lymphocytes Relative: 12 %
Lymphs Abs: 1.3 10*3/uL (ref 0.7–4.0)
MCH: 31.9 pg (ref 26.0–34.0)
MCHC: 35 g/dL (ref 30.0–36.0)
MCV: 91.1 fL (ref 78.0–100.0)
Monocytes Absolute: 0.5 10*3/uL (ref 0.1–1.0)
Monocytes Relative: 5 %
NEUTROS PCT: 83 %
Neutro Abs: 8.5 10*3/uL — ABNORMAL HIGH (ref 1.7–7.7)
Platelets: 208 10*3/uL (ref 150–400)
RBC: 5.27 MIL/uL (ref 4.22–5.81)
RDW: 14.6 % (ref 11.5–15.5)
WBC: 10.3 10*3/uL (ref 4.0–10.5)

## 2017-04-02 LAB — ETHANOL: Alcohol, Ethyl (B): 141 mg/dL — ABNORMAL HIGH (ref <5)

## 2017-04-02 NOTE — ED Triage Notes (Signed)
Patient arrives by EMS for complaints of intoxication and aggressive behavior. Patient initially called 911 and hung up. EMS responded to find intoxicated male "who did not feel good". Wife stated patient gone for 6-7 hours and then he was brought home by friends. Patient aggressive with EMS-[employer] with patient and EMS. Patient verbally aggressive and threatening with EMS-administered Haldol 10 mg IV and Versed 10 mg IM. Patient is currently sleeping.

## 2017-04-02 NOTE — ED Provider Notes (Signed)
[facility] DEPT Provider Note   CSN: [id number] Arrival date & time: 04/02/17  2036     History   Chief Complaint Chief Complaint  Patient presents with  . Intoxicated  . Aggressive Behavior    HPI Richard [name] is a 46 y.o. male hx of HTN, seizure, previous brain bleed, Here presenting with possible alcohol intoxication, agitation. Patient apparently went out to drink with some friends for several hours. He came back home drunk and called EMS and apparently hung up the phone. His wife became concerned because he became agitated and became combative. When EMS got there, he was agitated and aggressive and he was given 10 mg of IV Versed, 10 mg IV Haldol. Patient is comfortably sleeping now and unable to give much history.     The history is provided by the patient.   Level V caveat- AMS, intoxicated   Past Medical History:  Diagnosis Date  . Asthma   . Brain bleed ([place])   . Hypertension   . Seizures ([place])    last episode 03/2013    Patient Active Problem List   Diagnosis Date Noted  . Swollen testicle   . Atelectasis   . Syncope 08/26/2015  . Acute encephalopathy 08/26/2015  . Seizures ([place])   . Acute pulmonary edema (HCC)   . Lower leg DVT (deep venous thromboembolism), acute ([place])   . Protein-calorie malnutrition, severe ([place]) 02/19/2015  . Bacteremia due to Staphylococcus 02/17/2015  . Alcohol withdrawal delirium ([place])   . Acute respiratory failure with hypoxia ([place]) 02/13/2015  . Acute respiratory failure with hypoxemia ([place])   . Altered mental status   . Metabolic acidosis   . Essential hypertension   . Encephalopathy 02/11/2015  . Alcohol intoxication ([place]) 02/11/2015  . Lactic acidosis 02/11/2015  . Bleeding from the nose 03/19/2014  . TIA (transient ischemic attack) 12/24/2013  . Weakness 12/23/2013  . Left-sided weakness 12/23/2013  . Malignant hypertension 12/23/2013  . Seizure disorder ([place]) 12/23/2013  . Vomiting 06/18/2013  . Chest pain  06/18/2013  . Hypertensive urgency 10/30/2012  . Migraine variant 10/30/2012  . TOBACCO ABUSE 01/11/2008  . HYPERTENSION, BENIGN 11/24/2007  . CHRONIC FRONTAL SINUSITIS 11/24/2007  . Convulsions ([place]) 11/24/2007  . Headache(784.0) 11/24/2007    Past Surgical History:  Procedure Laterality Date  . LEG SURGERY         Home Medications    Prior to Admission medications   Medication Sig Start Date End Date Taking? Authorizing Provider  clonazePAM (KLONOPIN) 0.5 MG tablet Take 1 tablet (0.5 mg total) by mouth 2 (two) times daily. 03/01/15   Reyne Dumas, MD  cloNIDine (CATAPRES) 0.3 MG tablet Take 1 tablet (0.3 mg total) by mouth 3 (three) times daily. 09/10/15   Corey Harold, NP  folic acid (FOLVITE) 1 MG tablet Take 1 tablet (1 mg total) by mouth daily. 03/01/15   Reyne Dumas, MD  hydrALAZINE (APRESOLINE) 50 MG tablet Take 1 tablet (50 mg total) by mouth every 8 (eight) hours. 03/20/14   Charlynne Cousins, MD  labetalol (NORMODYNE) 200 MG tablet Take 1 tablet (200 mg total) by mouth 2 (two) times daily. 09/10/15   Corey Harold, NP  levETIRAcetam (KEPPRA) 500 MG tablet Take 1 tablet (500 mg total) by mouth 2 (two) times daily. 03/01/15   Reyne Dumas, MD  metoCLOPramide (REGLAN) 10 MG tablet Take 1 tablet (10 mg total) by mouth every 6 (six) hours as needed for nausea (or headache). 9/48/54   Delora Fuel,  MD  nicotine (NICODERM CQ - DOSED IN MG/24 HOURS) 21 mg/24hr patch Place 1 patch (21 mg total) onto the skin daily. Patient not taking: Reported on 08/25/2015 03/01/15   Reyne Dumas, MD  omeprazole (PRILOSEC) 40 MG capsule Take 40 mg by mouth daily. 01/22/15   Historical Provider, MD  QUEtiapine (SEROQUEL) 200 MG tablet Take 1 tablet (200 mg total) by mouth 2 (two) times daily. 09/10/15   Corey Harold, NP  thiamine 100 MG tablet Take 1 tablet (100 mg total) by mouth daily. 03/01/15   Reyne Dumas, MD    Family History Family History  Problem Relation Age of Onset  . Cancer Father     . Diabetes Mellitus II Sister     Social History Social History  Substance Use Topics  . Smoking status: Current Every Day Smoker    Packs/day: 1.00    Years: 26.00    Types: Cigarettes  . Smokeless tobacco: Never Used  . Alcohol use 2.4 oz/week    4 Cans of beer per week     Comment: drinks heavily. "pt sts he only drinks one beer a day now"     Allergies   Dilaudid [hydromorphone hcl]   Review of Systems Review of Systems  Psychiatric/Behavioral: Positive for agitation.  All other systems reviewed and are negative.    Physical Exam Updated Vital Signs BP (!) 154/104   Pulse 77   Temp 97.5 F (36.4 C) (Oral)   Resp 18   SpO2 94%   Physical Exam  Constitutional:  Intoxicated   HENT:  Head: Normocephalic.  No obvious scalp hematoma   Eyes: EOM are normal. Pupils are equal, round, and reactive to light.  Neck: Normal range of motion. Neck supple.  Cardiovascular: Normal rate, regular rhythm and normal heart sounds.   Pulmonary/Chest: Effort normal and breath sounds normal. No respiratory distress. He has no wheezes.  Abdominal: Soft. Bowel sounds are normal. He exhibits no distension.  Musculoskeletal: Normal range of motion.  Neurological:  Intoxicated. Moving all extremities. Not following commands   Skin: Skin is warm.  Psychiatric:  Unable   Nursing note and vitals reviewed.    ED Treatments / Results  Labs (all labs ordered are listed, but only abnormal results are displayed) Labs Reviewed  CBC WITH DIFFERENTIAL/PLATELET - Abnormal; Notable for the following:       Result Value   Neutro Abs 8.5 (*)    All other components within normal limits  COMPREHENSIVE METABOLIC PANEL - Abnormal; Notable for the following:    Glucose, Bld 101 (*)    ALT 14 (*)    All other components within normal limits  ETHANOL - Abnormal; Notable for the following:    Alcohol, Ethyl (B) 141 (*)    All other components within normal limits  ACETAMINOPHEN LEVEL -  Abnormal; Notable for the following:    Acetaminophen (Tylenol), Serum <10 (*)    All other components within normal limits  SALICYLATE LEVEL  RAPID URINE DRUG SCREEN, HOSP PERFORMED    EKG  EKG Interpretation None       Radiology Ct Head Wo Contrast  Result Date: 04/02/2017 CLINICAL DATA:  Initial evaluation for acute altered mental status. Agitation. Intoxication. EXAM: CT HEAD WITHOUT CONTRAST TECHNIQUE: Contiguous axial images were obtained from the base of the skull through the vertex without intravenous contrast. COMPARISON:  Prior CT from 02/11/2015. FINDINGS: Brain: Mildly advanced cerebral atrophy for patient age. Patchy hypodensity within the periventricular white matter most  consistent with chronic small vessel ischemic disease, also advanced for age, and similar to previous. Encephalomalacia within the anterior inferior left frontal lobe stable from previous, suspected to be related to remote trauma. No acute intracranial hemorrhage. No no evidence for acute large vessel territory infarct. No mass lesion, midline shift or mass effect. No hydrocephalus. No extra-axial fluid collection. Vascular: Vasculature is somewhat diffusely hyperdense, which may be related to dehydration/ hypovolemia. No asymmetric hyperdense vessel. Skull: Scalp soft tissues within normal limits.  Calvarium intact. Sinuses/Orbits: Globes and orbital soft tissues within normal limits. Chronic left maxillary sinusitis partially visualize. Visualized paranasal sinuses otherwise clear. No mastoid effusion. IMPRESSION: 1. No acute intracranial process. 2. Chronic encephalomalacia at the anterior inferior left frontal lobe, likely related to remote trauma. 3. Advanced cerebral atrophy with chronic microvascular ischemic disease for patient age. 4. Chronic left maxillary sinusitis. Electronically Signed   By: Jeannine Boga M.D.   On: 04/02/2017 22:04    Procedures Procedures (including critical care  time)  Medications Ordered in ED Medications - No data to display   Initial Impression / Assessment and Plan / ED Course  I have reviewed the triage vital signs and the nursing notes.  Pertinent labs & imaging results that were available during my care of the patient were reviewed by me and considered in my medical decision making (see chart for details).     Richard [name] is a 46 y.o. male here with alcohol intoxication, AMS. Hx of brain bleed so will get CT head. Will get labs, tox. Will reassess.   11:34 PM ETOH was 141. Nl AG on chemistry. CT head unremarkable. Clinically sober now. Denies suicidal or homicidal ideations. Will dc home with family.   Final Clinical Impressions(s) / ED Diagnoses   Final diagnoses:  None    New Prescriptions New Prescriptions   No medications on file     Drenda Freeze, MD 04/02/17 2335

## 2017-04-02 NOTE — Discharge Instructions (Addendum)
Stop drinking alcohol,.   See your doctor  Return to ER if you have thoughts of harming yourself or others, hallucinations, seizures, tremors.

## 2017-04-02 NOTE — ED Notes (Signed)
Bed: AV69 Expected date:  Expected time:  Means of arrival:  Comments: EMS 46 yo male aggressive/versed

## 2017-04-03 NOTE — ED Notes (Signed)
Called patients mom and no answer.

## 2017-04-09 DIAGNOSIS — R600 Localized edema: Secondary | ICD-10-CM | POA: Diagnosis not present

## 2017-12-19 ENCOUNTER — Encounter (HOSPITAL_COMMUNITY): Payer: Self-pay | Admitting: Emergency Medicine

## 2017-12-19 ENCOUNTER — Emergency Department (HOSPITAL_COMMUNITY)
Admission: EM | Admit: 2017-12-19 | Discharge: 2017-12-19 | Disposition: A | Discharge status: Home / Self Care | Payer: Medicare Other | Attending: Emergency Medicine | Admitting: Emergency Medicine

## 2017-12-19 DIAGNOSIS — J45909 Unspecified asthma, uncomplicated: Secondary | ICD-10-CM | POA: Insufficient documentation

## 2017-12-19 DIAGNOSIS — R51 Headache: Secondary | ICD-10-CM | POA: Diagnosis present

## 2017-12-19 DIAGNOSIS — Z885 Allergy status to narcotic agent status: Secondary | ICD-10-CM | POA: Insufficient documentation

## 2017-12-19 DIAGNOSIS — I10 Essential (primary) hypertension: Secondary | ICD-10-CM

## 2017-12-19 DIAGNOSIS — G44019 Episodic cluster headache, not intractable: Secondary | ICD-10-CM

## 2017-12-19 DIAGNOSIS — R52 Pain, unspecified: Secondary | ICD-10-CM | POA: Diagnosis not present

## 2017-12-19 DIAGNOSIS — G4489 Other headache syndrome: Secondary | ICD-10-CM | POA: Diagnosis not present

## 2017-12-19 DIAGNOSIS — Z8673 Personal history of transient ischemic attack (TIA), and cerebral infarction without residual deficits: Secondary | ICD-10-CM | POA: Diagnosis not present

## 2017-12-19 DIAGNOSIS — Z79899 Other long term (current) drug therapy: Secondary | ICD-10-CM | POA: Insufficient documentation

## 2017-12-19 DIAGNOSIS — F17210 Nicotine dependence, cigarettes, uncomplicated: Secondary | ICD-10-CM | POA: Insufficient documentation

## 2017-12-19 MED ORDER — LABETALOL HCL 200 MG PO TABS: 200.0000 mg | ORAL_TABLET | Freq: Once | ORAL | Status: DC

## 2017-12-19 MED ORDER — CLONIDINE HCL 0.2 MG PO TABS: 0.3000 mg | ORAL_TABLET | Freq: Once | ORAL | Status: DC

## 2017-12-19 NOTE — Discharge Instructions (Signed)
It is very important for you to take all of your medications as prescribed without missing any doses.  Please follow-up with the [facility] regarding her blood pressure control.  Return to the ER if you have any sudden onset of severe headache, chest pain, breathing problems, weakness in arm or leg, numbness, or vision changes.

## 2017-12-19 NOTE — ED Triage Notes (Signed)
Pt c/o HA x 1 hour. Hx of HPTN. PT BP is 212/134.  Pt states he is taking meds as prescribed.  NSR. A&Ox4

## 2017-12-19 NOTE — ED Notes (Signed)
Pt does not want to wait for medications.  Pt will take meds when he gets home.

## 2017-12-20 ENCOUNTER — Other Ambulatory Visit: Payer: Self-pay

## 2017-12-20 ENCOUNTER — Encounter (HOSPITAL_COMMUNITY): Payer: Self-pay

## 2017-12-20 ENCOUNTER — Emergency Department (HOSPITAL_COMMUNITY)
Admission: EM | Admit: 2017-12-20 | Discharge: 2017-12-20 | Discharge status: Against Medical Advice | Payer: Medicare Other | Attending: Emergency Medicine | Admitting: Emergency Medicine

## 2017-12-20 DIAGNOSIS — Z5321 Procedure and treatment not carried out due to patient leaving prior to being seen by health care provider: Secondary | ICD-10-CM | POA: Diagnosis not present

## 2017-12-20 DIAGNOSIS — R51 Headache: Secondary | ICD-10-CM | POA: Diagnosis present

## 2017-12-20 DIAGNOSIS — G4489 Other headache syndrome: Secondary | ICD-10-CM | POA: Diagnosis not present

## 2017-12-20 DIAGNOSIS — R030 Elevated blood-pressure reading, without diagnosis of hypertension: Secondary | ICD-10-CM | POA: Diagnosis not present

## 2017-12-20 NOTE — ED Triage Notes (Signed)
Per EMS- Patient c/o cluster headache pain and was seen at [facility] yesterday for the same. Headache 10/10 Patient stated that headache decreases when he wears O2. EMS placed O2 4L/min via [place] and pain decreased to 6/10.

## 2017-12-20 NOTE — ED Provider Notes (Signed)
[facility] EMERGENCY DEPARTMENT Provider Note   CSN: [id number] Arrival date & time: 12/19/17  1607     History   Chief Complaint Chief Complaint  Patient presents with  . Headache    HPI Richard [name] is a 47 y.o. male.  47 year old male with past medical history including hypertension, seizures, call abuse, TIA who presents with headache.  Patient reports a gradual onset of headache approximately 1 hour prior to arrival to the ED.  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  He asked to be put on oxygen by EMS and reports almost immediate improvement in his headache with oxygen.  He states he has been compliant with his blood pressure medications but has not yet had his evening blood pressure medicine.  He denies any chest pain, shortness of breath, vomiting, diarrhea, fevers, recent illness, extremity numbness/weakness, gait problems, or vision changes.  He reports previous history of headache that improved with oxygen.   The history is provided by the patient.  Headache      Past Medical History:  Diagnosis Date  . Asthma   . Brain bleed ([place])   . Hypertension   . Seizures ([place])    last episode 03/2013    Patient Active Problem List   Diagnosis Date Noted  . Swollen testicle   . Atelectasis   . Syncope 08/26/2015  . Acute encephalopathy 08/26/2015  . Seizures ([place])   . Acute pulmonary edema (HCC)   . Lower leg DVT (deep venous thromboembolism), acute ([place])   . Protein-calorie malnutrition, severe ([place]) 02/19/2015  . Bacteremia due to Staphylococcus 02/17/2015  . Alcohol withdrawal delirium ([place])   . Acute respiratory failure with hypoxia ([place]) 02/13/2015  . Acute respiratory failure with hypoxemia ([place])   . Altered mental status   . Metabolic acidosis   . Essential hypertension   . Encephalopathy 02/11/2015  . Alcohol intoxication ([place]) 02/11/2015  . Lactic acidosis 02/11/2015  . Bleeding from the nose 03/19/2014  . TIA (transient ischemic  attack) 12/24/2013  . Weakness 12/23/2013  . Left-sided weakness 12/23/2013  . Malignant hypertension 12/23/2013  . Seizure disorder ([place]) 12/23/2013  . Vomiting 06/18/2013  . Chest pain 06/18/2013  . Hypertensive urgency 10/30/2012  . Migraine variant 10/30/2012  . TOBACCO ABUSE 01/11/2008  . HYPERTENSION, BENIGN 11/24/2007  . CHRONIC FRONTAL SINUSITIS 11/24/2007  . Convulsions ([place]) 11/24/2007  . Headache(784.0) 11/24/2007    Past Surgical History:  Procedure Laterality Date  . LEG SURGERY         Home Medications    Prior to Admission medications   Medication Sig Start Date End Date Taking? Authorizing Provider  clonazePAM (KLONOPIN) 0.5 MG tablet Take 1 tablet (0.5 mg total) by mouth 2 (two) times daily. 03/01/15   Reyne Dumas, MD  cloNIDine (CATAPRES) 0.3 MG tablet Take 1 tablet (0.3 mg total) by mouth 3 (three) times daily. 09/10/15   Corey Harold, NP  folic acid (FOLVITE) 1 MG tablet Take 1 tablet (1 mg total) by mouth daily. 03/01/15   Reyne Dumas, MD  hydrALAZINE (APRESOLINE) 50 MG tablet Take 1 tablet (50 mg total) by mouth every 8 (eight) hours. 03/20/14   Charlynne Cousins, MD  labetalol (NORMODYNE) 200 MG tablet Take 1 tablet (200 mg total) by mouth 2 (two) times daily. 09/10/15   Corey Harold, NP  levETIRAcetam (KEPPRA) 500 MG tablet Take 1 tablet (500 mg total) by mouth 2 (two) times daily. 03/01/15   Reyne Dumas, MD  metoCLOPramide (REGLAN) 10 MG tablet Take 1 tablet (10 mg total) by mouth every 6 (six) hours as needed for nausea (or headache). 2/29/79   Delora Fuel, MD  nicotine (NICODERM CQ - DOSED IN MG/24 HOURS) 21 mg/24hr patch Place 1 patch (21 mg total) onto the skin daily. Patient not taking: Reported on 08/25/2015 03/01/15   Reyne Dumas, MD  omeprazole (PRILOSEC) 40 MG capsule Take 40 mg by mouth daily. 01/22/15   [provider]  QUEtiapine (SEROQUEL) 200 MG tablet Take 1 tablet (200 mg total) by mouth 2 (two) times daily. 09/10/15    Corey Harold, NP  thiamine 100 MG tablet Take 1 tablet (100 mg total) by mouth daily. 03/01/15   Reyne Dumas, MD    Family History Family History  Problem Relation Age of Onset  . Cancer Father   . Diabetes Mellitus II Sister     Social History Social History   Tobacco Use  . Smoking status: Current Every Day Smoker    Packs/day: 1.00    Years: 26.00    Pack years: 26.00    Types: Cigarettes  . Smokeless tobacco: Never Used  Substance Use Topics  . Alcohol use: Yes    Alcohol/week: 2.4 oz    Types: 4 Cans of beer per week    Comment: drinks heavily. "pt sts he only drinks one beer a day now"  . Drug use: Yes    Types: Cocaine    Comment: last use several years ago      Allergies   Dilaudid [hydromorphone hcl]   Review of Systems Review of Systems  Neurological: Positive for headaches.   All other systems reviewed and are negative except that which was mentioned in HPI   Physical Exam Updated Vital Signs BP (!) 196/128 (BP Location: Left Arm)   Pulse 70   Temp 98 F (36.7 C) (Oral)   Resp 17   Ht 5\' 9"  (1.753 m)   Wt 102.1 kg (225 lb)   SpO2 96%   BMI 33.23 kg/m   Physical Exam  Constitutional: He is oriented to person, place, and time. He appears well-developed and well-nourished. No distress.  Awake, alert  HENT:  Head: Normocephalic and atraumatic.  Eyes: Conjunctivae and EOM are normal. Pupils are equal, round, and reactive to light.  Neck: Neck supple.  Cardiovascular: Normal rate, regular rhythm and normal heart sounds.  No murmur heard. Pulmonary/Chest: Effort normal and breath sounds normal. No respiratory distress.  Abdominal: Soft. Bowel sounds are normal. He exhibits no distension. There is no tenderness.  Musculoskeletal: He exhibits no edema.  Neurological: He is alert and oriented to person, place, and time. He has normal reflexes. No cranial nerve deficit. He exhibits normal muscle tone.  Fluent speech, normal finger-to-nose  testing, negative pronator drift, no clonus 5/5 strength and normal sensation x all 4 extremities  Skin: Skin is warm and dry.  Psychiatric: He has a normal mood and affect. Judgment and thought content normal.  Nursing note and vitals reviewed.    ED Treatments / Results  Labs (all labs ordered are listed, but only abnormal results are displayed) Labs Reviewed - No data to display  EKG  EKG Interpretation None       Radiology No results found.  Procedures Procedures (including critical care time)  Medications Ordered in ED Medications - No data to display   Initial Impression / Assessment and Plan / ED Course  I have reviewed the triage vital signs and  the nursing notes.      Headache resolved with oxygen therapy alone by the time I examined the patient.  Because he has had previous history of same type of headache with resolution with oxygen, symptoms suggest cluster headache.  Given gradual onset of headache and complete resolution, I doubt acute intracranial process.  He is hypertensive here but has not had his evening medication, offered the medication but patient would rather be discharged and take it when he gets home.  Extensively reviewed return precautions.  Final Clinical Impressions(s) / ED Diagnoses   Final diagnoses:  Episodic cluster headache, not intractable  Essential hypertension    ED Discharge Orders    None       Little, Wenda Overland, MD 12/20/17 [phone]

## 2017-12-26 ENCOUNTER — Emergency Department (HOSPITAL_COMMUNITY)
Admission: EM | Admit: 2017-12-26 | Discharge: 2017-12-26 | Disposition: A | Discharge status: Home / Self Care | Payer: Medicare Other | Source: Home / Self Care

## 2017-12-26 ENCOUNTER — Emergency Department (HOSPITAL_COMMUNITY): Payer: Medicare Other

## 2017-12-26 ENCOUNTER — Emergency Department (HOSPITAL_COMMUNITY)
Admission: EM | Admit: 2017-12-26 | Discharge: 2017-12-26 | Disposition: A | Discharge status: Home / Self Care | Payer: Medicare Other | Attending: Emergency Medicine | Admitting: Emergency Medicine

## 2017-12-26 ENCOUNTER — Other Ambulatory Visit: Payer: Self-pay

## 2017-12-26 ENCOUNTER — Encounter (HOSPITAL_COMMUNITY): Payer: Self-pay

## 2017-12-26 DIAGNOSIS — R531 Weakness: Secondary | ICD-10-CM | POA: Diagnosis not present

## 2017-12-26 DIAGNOSIS — R030 Elevated blood-pressure reading, without diagnosis of hypertension: Secondary | ICD-10-CM | POA: Diagnosis not present

## 2017-12-26 DIAGNOSIS — R52 Pain, unspecified: Secondary | ICD-10-CM | POA: Diagnosis not present

## 2017-12-26 DIAGNOSIS — Z79899 Other long term (current) drug therapy: Secondary | ICD-10-CM | POA: Diagnosis not present

## 2017-12-26 DIAGNOSIS — G8929 Other chronic pain: Secondary | ICD-10-CM

## 2017-12-26 DIAGNOSIS — G4489 Other headache syndrome: Secondary | ICD-10-CM | POA: Diagnosis not present

## 2017-12-26 DIAGNOSIS — F17210 Nicotine dependence, cigarettes, uncomplicated: Secondary | ICD-10-CM | POA: Insufficient documentation

## 2017-12-26 DIAGNOSIS — R51 Headache: Secondary | ICD-10-CM | POA: Insufficient documentation

## 2017-12-26 DIAGNOSIS — Z5321 Procedure and treatment not carried out due to patient leaving prior to being seen by health care provider: Secondary | ICD-10-CM | POA: Diagnosis not present

## 2017-12-26 DIAGNOSIS — R519 Headache, unspecified: Secondary | ICD-10-CM

## 2017-12-26 DIAGNOSIS — I10 Essential (primary) hypertension: Secondary | ICD-10-CM

## 2017-12-26 DIAGNOSIS — J45909 Unspecified asthma, uncomplicated: Secondary | ICD-10-CM | POA: Diagnosis not present

## 2017-12-26 MED ORDER — LABETALOL HCL 5 MG/ML IV SOLN
10.0000 mg | Freq: Once | INTRAVENOUS | Status: AC
  Administered 2017-12-26: 10 mg via INTRAVENOUS
  Filled 2017-12-26: qty 4

## 2017-12-26 MED ORDER — LABETALOL HCL 100 MG PO TABS: 200.0000 mg | ORAL_TABLET | Freq: Two times a day (BID) | ORAL | 0 refills | Status: DC

## 2017-12-26 MED ORDER — CLONIDINE HCL 0.3 MG PO TABS: 0.3000 mg | ORAL_TABLET | Freq: Three times a day (TID) | ORAL | 0 refills | Status: DC

## 2017-12-26 MED ORDER — GUAIFENESIN 100 MG/5ML PO LIQD: 100.0000 mg | ORAL | 0 refills | Status: DC | PRN

## 2017-12-26 MED ORDER — LORAZEPAM 2 MG/ML IJ SOLN
1.0000 mg | Freq: Once | INTRAMUSCULAR | Status: AC
  Administered 2017-12-26: 1 mg via INTRAVENOUS
  Filled 2017-12-26: qty 1

## 2017-12-26 MED ORDER — ACETAMINOPHEN 500 MG PO TABS
1000.0000 mg | ORAL_TABLET | Freq: Once | ORAL | Status: DC
  Filled 2017-12-26: qty 2

## 2017-12-26 NOTE — ED Triage Notes (Signed)
PT calling from room for help. Pt observed to be agitated and Pt reported he wanted to leave. Pt reported he felt hot . This Probation officer gave Pt a Col cloth and helped Pt into bed.  EDP notified of Pt agitation.

## 2017-12-26 NOTE — ED Triage Notes (Signed)
Richard [name] went home after DC this AM and returned via EMS becaue he did not get med for head cold.  Richard [name] is requesting  Guaifenesin  For cold. DR Francia Greaves  Wrote Rx for requested med.

## 2017-12-26 NOTE — ED Triage Notes (Signed)
PT BIB [facility] from home c/o headache for the last couple of days. States that usually he can go out side and get some fresh air and it would help but not today. Pt has hx of htn and states that he is taking medication as prescribe. En route pt bp was 205/150. Pt is axox4. Pt also c/o left side pain with the headache.

## 2017-12-26 NOTE — ED Notes (Signed)
ED Provider at bedside. 

## 2017-12-26 NOTE — ED Notes (Signed)
Patient transported to CT 

## 2017-12-26 NOTE — ED Notes (Signed)
Notified edp about pt left sided weakness, no code stroke due unknown last seen normal.

## 2017-12-26 NOTE — ED Provider Notes (Signed)
[facility] EMERGENCY DEPARTMENT Provider Note   CSN: [id number] Arrival date & time: 12/26/17  0617     History   Chief Complaint Chief Complaint  Patient presents with  . Hypertension  . Migraine    HPI Richard [name] is a 47 y.o. male.  47 year old male with history of intracranial hemorrhage, hypertension, and seizures presents with complaint of headache.  Patient reports that his headache has been ongoing for quite "some time."  The patient is unable to tell me exactly when his headache started but he thinks is been "several days at least."Patient reports that he has had this type of headache multiple times in the past.  Typically improves with administration of oxygen.  Patient denies any associated nausea, vomiting, fever, visual changes, or focal weakness.  He reports that his headache is mostly on the left side of his head.  He does not appear to be in any acute distress during our interaction.  He does report that he has not taken any of his routine medications this morning.   The history is provided by the patient.  Headache   This is a recurrent problem. Episode onset: unknown. The problem occurs constantly. The problem has not changed since onset.The headache is associated with nothing. The pain is located in the left unilateral region. The pain is mild. The pain does not radiate. Pertinent negatives include no orthopnea, no palpitations, no nausea and no vomiting.    Past Medical History:  Diagnosis Date  . Asthma   . Brain bleed ([place])   . Hypertension   . Seizures ([place])    last episode 03/2013    Patient Active Problem List   Diagnosis Date Noted  . Swollen testicle   . Atelectasis   . Syncope 08/26/2015  . Acute encephalopathy 08/26/2015  . Seizures ([place])   . Acute pulmonary edema (HCC)   . Lower leg DVT (deep venous thromboembolism), acute ([place])   . Protein-calorie malnutrition, severe ([place]) 02/19/2015  . Bacteremia due to  Staphylococcus 02/17/2015  . Alcohol withdrawal delirium ([place])   . Acute respiratory failure with hypoxia ([place]) 02/13/2015  . Acute respiratory failure with hypoxemia ([place])   . Altered mental status   . Metabolic acidosis   . Essential hypertension   . Encephalopathy 02/11/2015  . Alcohol intoxication ([place]) 02/11/2015  . Lactic acidosis 02/11/2015  . Bleeding from the nose 03/19/2014  . TIA (transient ischemic attack) 12/24/2013  . Weakness 12/23/2013  . Left-sided weakness 12/23/2013  . Malignant hypertension 12/23/2013  . Seizure disorder ([place]) 12/23/2013  . Vomiting 06/18/2013  . Chest pain 06/18/2013  . Hypertensive urgency 10/30/2012  . Migraine variant 10/30/2012  . TOBACCO ABUSE 01/11/2008  . HYPERTENSION, BENIGN 11/24/2007  . CHRONIC FRONTAL SINUSITIS 11/24/2007  . Convulsions ([place]) 11/24/2007  . Headache(784.0) 11/24/2007    Past Surgical History:  Procedure Laterality Date  . LEG SURGERY         Home Medications    Prior to Admission medications   Medication Sig Start Date End Date Taking? Authorizing Provider  clonazePAM (KLONOPIN) 0.5 MG tablet Take 1 tablet (0.5 mg total) by mouth 2 (two) times daily. 03/01/15   Reyne Dumas, MD  cloNIDine (CATAPRES) 0.3 MG tablet Take 1 tablet (0.3 mg total) by mouth 3 (three) times daily. 09/10/15   Corey Harold, NP  folic acid (FOLVITE) 1 MG tablet Take 1 tablet (1 mg total) by mouth daily. 03/01/15   Reyne Dumas, MD  hydrALAZINE (APRESOLINE) 50  MG tablet Take 1 tablet (50 mg total) by mouth every 8 (eight) hours. 03/20/14   Charlynne Cousins, MD  labetalol (NORMODYNE) 200 MG tablet Take 1 tablet (200 mg total) by mouth 2 (two) times daily. 09/10/15   Corey Harold, NP  levETIRAcetam (KEPPRA) 500 MG tablet Take 1 tablet (500 mg total) by mouth 2 (two) times daily. 03/01/15   Reyne Dumas, MD  metoCLOPramide (REGLAN) 10 MG tablet Take 1 tablet (10 mg total) by mouth every 6 (six) hours as needed for nausea (or  headache). 6/44/03   Delora Fuel, MD  nicotine (NICODERM CQ - DOSED IN MG/24 HOURS) 21 mg/24hr patch Place 1 patch (21 mg total) onto the skin daily. Patient not taking: Reported on 08/25/2015 03/01/15   Reyne Dumas, MD  omeprazole (PRILOSEC) 40 MG capsule Take 40 mg by mouth daily. 01/22/15   [provider]  QUEtiapine (SEROQUEL) 200 MG tablet Take 1 tablet (200 mg total) by mouth 2 (two) times daily. 09/10/15   Corey Harold, NP  thiamine 100 MG tablet Take 1 tablet (100 mg total) by mouth daily. 03/01/15   Reyne Dumas, MD    Family History Family History  Problem Relation Age of Onset  . Cancer Father   . Diabetes Mellitus II Sister     Social History Social History   Tobacco Use  . Smoking status: Current Every Day Smoker    Packs/day: 1.00    Years: 26.00    Pack years: 26.00    Types: Cigarettes  . Smokeless tobacco: Never Used  Substance Use Topics  . Alcohol use: Yes    Alcohol/week: 2.4 oz    Types: 4 Cans of beer per week    Comment: drinks heavily. "pt sts he only drinks one beer a day now"  . Drug use: Yes    Types: Cocaine    Comment: last use several years ago      Allergies   Dilaudid [hydromorphone hcl]   Review of Systems Review of Systems  Cardiovascular: Negative for palpitations and orthopnea.  Gastrointestinal: Negative for nausea and vomiting.  Neurological: Positive for headaches.  All other systems reviewed and are negative.    Physical Exam Updated Vital Signs Pulse 64   Temp 98 F (36.7 C) (Oral)   Resp 14   Ht 5\' 9"  (1.753 m)   Wt 102.1 kg (225 lb)   SpO2 94%   BMI 33.23 kg/m   Physical Exam  Constitutional: He is oriented to person, place, and time. He appears well-developed and well-nourished. No distress.  HENT:  Head: Normocephalic and atraumatic.  Mouth/Throat: Oropharynx is clear and moist.  Eyes: Conjunctivae and EOM are normal. Pupils are equal, round, and reactive to light.  Neck: Normal range of motion.  Neck supple.  Cardiovascular: Normal rate, regular rhythm and normal heart sounds.  Pulmonary/Chest: Effort normal and breath sounds normal. No respiratory distress.  Abdominal: Soft. He exhibits no distension. There is no tenderness.  Musculoskeletal: Normal range of motion. He exhibits no edema or deformity.  Neurological: He is alert and oriented to person, place, and time. He displays normal reflexes. No cranial nerve deficit or sensory deficit. He exhibits normal muscle tone. Coordination normal.  Skin: Skin is warm and dry.  Psychiatric: He has a normal mood and affect.  Nursing note and vitals reviewed.    ED Treatments / Results  Labs (all labs ordered are listed, but only abnormal results are displayed) Labs Reviewed - No data  to display  EKG  EKG Interpretation  Date/Time:  Sunday December 26 2017 06:31:19 EST Ventricular Rate:  60 PR Interval:    QRS Duration: 80 QT Interval:  434 QTC Calculation: 434 R Axis:   41 Text Interpretation:  Sinus rhythm Left atrial enlargement Probable left ventricular hypertrophy ST elev, probable normal early repol pattern Confirmed by Ripley Fraise [phone]) on 12/26/2017 6:54:12 AM       Radiology No results found.  Procedures Procedures (including critical care time)  Medications Ordered in ED Medications  labetalol (NORMODYNE,TRANDATE) injection 10 mg (not administered)  acetaminophen (TYLENOL) tablet 1,000 mg (not administered)     Initial Impression / Assessment and Plan / ED Course  I have reviewed the triage vital signs and the nursing notes.  Pertinent labs & imaging results that were available during my care of the patient were reviewed by me and considered in my medical decision making (see chart for details).     MDM screen complete.  Patient is presenting with a chronic migraine.  Patient appears improved following ED medication and treatment.  Patient now desires to be discharged home.  He declines to remain  in the ED for longer for further workup or management of his mildly elevated blood pressure.  He reports that he has blood pressure medications at home that he will take when he gets home.  He also reports that he has a pending appointment with [facility].  He does request a refill on some of his blood pressure medications -- which I will do.  I do not see evidence of red flag symptoms regarding his headache.  I suspect that his elevated blood pressure is a long-standing chronic issue.  CT imaging does not reveal acute pathology.  Strict return precautions given and understood -especially increased headache, visual change, nausea, vomiting, fever, weakness.  Final Clinical Impressions(s) / ED Diagnoses   Final diagnoses:  Hypertension, unspecified type  Chronic nonintractable headache, unspecified headache type    ED Discharge Orders    None       Valarie Merino, MD 12/26/17 [phone]

## 2017-12-26 NOTE — ED Triage Notes (Signed)
PT refused to stay and is checking out AMA

## 2017-12-26 NOTE — ED Notes (Signed)
Pt instructed to fill BP Rx .  Dr Delfin Edis aware of BP 185/123 and wrote PT RX for [employer]

## 2017-12-27 ENCOUNTER — Encounter (HOSPITAL_COMMUNITY): Payer: Self-pay | Admitting: Emergency Medicine

## 2017-12-27 ENCOUNTER — Encounter (HOSPITAL_COMMUNITY): Payer: Self-pay

## 2017-12-27 ENCOUNTER — Emergency Department (HOSPITAL_COMMUNITY)
Admission: EM | Admit: 2017-12-27 | Discharge: 2017-12-27 | Disposition: A | Discharge status: Home / Self Care | Payer: Medicare Other | Source: Home / Self Care | Attending: Emergency Medicine | Admitting: Emergency Medicine

## 2017-12-27 ENCOUNTER — Other Ambulatory Visit: Payer: Self-pay

## 2017-12-27 ENCOUNTER — Emergency Department (HOSPITAL_COMMUNITY)
Admission: EM | Admit: 2017-12-27 | Discharge: 2017-12-27 | Discharge status: Against Medical Advice | Payer: Medicare Other | Source: Home / Self Care

## 2017-12-27 DIAGNOSIS — R51 Headache: Secondary | ICD-10-CM

## 2017-12-27 DIAGNOSIS — R52 Pain, unspecified: Secondary | ICD-10-CM | POA: Diagnosis not present

## 2017-12-27 DIAGNOSIS — Z5321 Procedure and treatment not carried out due to patient leaving prior to being seen by health care provider: Secondary | ICD-10-CM

## 2017-12-27 DIAGNOSIS — R519 Headache, unspecified: Secondary | ICD-10-CM

## 2017-12-27 DIAGNOSIS — I10 Essential (primary) hypertension: Secondary | ICD-10-CM | POA: Diagnosis not present

## 2017-12-27 DIAGNOSIS — G4489 Other headache syndrome: Secondary | ICD-10-CM | POA: Diagnosis not present

## 2017-12-27 DIAGNOSIS — R0902 Hypoxemia: Secondary | ICD-10-CM | POA: Diagnosis not present

## 2017-12-27 MED ORDER — CLONIDINE HCL 0.2 MG PO TABS
0.3000 mg | ORAL_TABLET | Freq: Once | ORAL | Status: AC
  Administered 2017-12-27: 0.3 mg via ORAL
  Filled 2017-12-27: qty 1

## 2017-12-27 NOTE — ED Provider Notes (Signed)
[facility] EMERGENCY DEPARTMENT Provider Note   CSN: [id number] Arrival date & time: 12/27/17  1257     History   Chief Complaint Chief Complaint  Patient presents with  . Headache    HPI Richard [name] is a 47 y.o. male.   Migraine  This is a chronic problem. The current episode started 6 to 12 hours ago. The problem occurs daily. The problem has been resolved. Associated symptoms include headaches (daily, none currently). Pertinent negatives include no chest pain, no abdominal pain and no shortness of breath. Nothing aggravates the symptoms. Relieved by: took blood pressure medication. The treatment provided significant relief.    Past Medical History:  Diagnosis Date  . Asthma   . Brain bleed ([place])   . Hypertension   . Seizures ([place])    last episode 03/2013    Patient Active Problem List   Diagnosis Date Noted  . Swollen testicle   . Atelectasis   . Syncope 08/26/2015  . Acute encephalopathy 08/26/2015  . Seizures ([place])   . Acute pulmonary edema (HCC)   . Lower leg DVT (deep venous thromboembolism), acute ([place])   . Protein-calorie malnutrition, severe ([place]) 02/19/2015  . Bacteremia due to Staphylococcus 02/17/2015  . Alcohol withdrawal delirium ([place])   . Acute respiratory failure with hypoxia ([place]) 02/13/2015  . Acute respiratory failure with hypoxemia ([place])   . Altered mental status   . Metabolic acidosis   . Essential hypertension   . Encephalopathy 02/11/2015  . Alcohol intoxication ([place]) 02/11/2015  . Lactic acidosis 02/11/2015  . Bleeding from the nose 03/19/2014  . TIA (transient ischemic attack) 12/24/2013  . Weakness 12/23/2013  . Left-sided weakness 12/23/2013  . Malignant hypertension 12/23/2013  . Seizure disorder ([place]) 12/23/2013  . Vomiting 06/18/2013  . Chest pain 06/18/2013  . Hypertensive urgency 10/30/2012  . Migraine variant 10/30/2012  . TOBACCO ABUSE 01/11/2008  . HYPERTENSION, BENIGN 11/24/2007  . CHRONIC FRONTAL  SINUSITIS 11/24/2007  . Convulsions ([place]) 11/24/2007  . Headache(784.0) 11/24/2007    Past Surgical History:  Procedure Laterality Date  . LEG SURGERY         Home Medications    Prior to Admission medications   Medication Sig Start Date End Date Taking? Authorizing Provider  phenytoin (DILANTIN) 50 MG tablet Chew 50 mg by mouth 2 (two) times daily.   Yes [provider]  clonazePAM (KLONOPIN) 0.5 MG tablet Take 1 tablet (0.5 mg total) by mouth 2 (two) times daily. 03/01/15   Reyne Dumas, MD  cloNIDine (CATAPRES) 0.3 MG tablet Take 1 tablet (0.3 mg total) by mouth 3 (three) times daily. 09/10/15   Corey Harold, NP  cloNIDine (CATAPRES) 0.3 MG tablet Take 1 tablet (0.3 mg total) by mouth 3 (three) times daily. Patient not taking: Reported on 12/27/2017 12/26/17   Valarie Merino, MD  folic acid (FOLVITE) 1 MG tablet Take 1 tablet (1 mg total) by mouth daily. Patient not taking: Reported on 12/27/2017 03/01/15   Reyne Dumas, MD  guaiFENesin (ROBITUSSIN) 100 MG/5ML liquid Take 5-10 mLs (100-200 mg total) by mouth every 4 (four) hours as needed for cough. Patient not taking: Reported on 12/27/2017 12/26/17   Valarie Merino, MD  hydrALAZINE (APRESOLINE) 50 MG tablet Take 1 tablet (50 mg total) by mouth every 8 (eight) hours. 03/20/14   Charlynne Cousins, MD  labetalol (NORMODYNE) 100 MG tablet Take 2 tablets (200 mg total) by mouth 2 (two) times daily. 12/26/17   Dene Gentry  C, MD  labetalol (NORMODYNE) 200 MG tablet Take 1 tablet (200 mg total) by mouth 2 (two) times daily. Patient not taking: Reported on 12/27/2017 09/10/15   Corey Harold, NP  levETIRAcetam (KEPPRA) 500 MG tablet Take 1 tablet (500 mg total) by mouth 2 (two) times daily. Patient not taking: Reported on 12/27/2017 03/01/15   Reyne Dumas, MD  metoCLOPramide (REGLAN) 10 MG tablet Take 1 tablet (10 mg total) by mouth every 6 (six) hours as needed for nausea (or headache). Patient not taking: Reported on  2/77/8242 3/53/61   Delora Fuel, MD  nicotine (NICODERM CQ - DOSED IN MG/24 HOURS) 21 mg/24hr patch Place 1 patch (21 mg total) onto the skin daily. Patient not taking: Reported on 08/25/2015 03/01/15   Reyne Dumas, MD  QUEtiapine (SEROQUEL) 200 MG tablet Take 1 tablet (200 mg total) by mouth 2 (two) times daily. 09/10/15   Corey Harold, NP  thiamine 100 MG tablet Take 1 tablet (100 mg total) by mouth daily. Patient not taking: Reported on 12/27/2017 03/01/15   Reyne Dumas, MD    Family History Family History  Problem Relation Age of Onset  . Cancer Father   . Diabetes Mellitus II Sister     Social History Social History   Tobacco Use  . Smoking status: Current Every Day Smoker    Packs/day: 1.00    Years: 26.00    Pack years: 26.00    Types: Cigarettes  . Smokeless tobacco: Never Used  Substance Use Topics  . Alcohol use: Yes    Alcohol/week: 2.4 oz    Types: 4 Cans of beer per week    Comment: drinks heavily. "pt sts he only drinks one beer a day now"  . Drug use: Yes    Types: Cocaine    Comment: last use several years ago      Allergies   Dilaudid [hydromorphone hcl]   Review of Systems Review of Systems  Constitutional: Negative for chills and fever.  HENT: Negative for ear pain and sore throat.   Eyes: Negative for pain and visual disturbance.  Respiratory: Negative for cough and shortness of breath.   Cardiovascular: Negative for chest pain and palpitations.  Gastrointestinal: Negative for abdominal pain and vomiting.  Genitourinary: Negative for dysuria and hematuria.  Musculoskeletal: Negative for arthralgias and back pain.  Skin: Negative for color change and rash.  Neurological: Positive for headaches (daily, none currently). Negative for seizures and syncope.  All other systems reviewed and are negative.    Physical Exam Updated Vital Signs BP (!) 217/118 (BP Location: Left Arm)   Pulse 64   Temp 97.6 F (36.4 C) (Oral)   Resp 18   SpO2  95%   Physical Exam  Constitutional: He is oriented to person, place, and time. He appears well-developed and well-nourished.  HENT:  Head: Normocephalic and atraumatic.  Eyes: Conjunctivae are normal.  Neck: Neck supple.  Cardiovascular: Normal rate and regular rhythm.  Pulmonary/Chest: Effort normal. No respiratory distress.  Abdominal: Soft. There is no tenderness.  Musculoskeletal: He exhibits no edema.  Neurological: He is alert and oriented to person, place, and time. He has normal strength. GCS eye subscore is 4. GCS verbal subscore is 5. GCS motor subscore is 6.  Skin: Skin is warm and dry.  Psychiatric: He has a normal mood and affect.  Nursing note and vitals reviewed.   Mental Status:  Orientation: Alert and oriented to person, place, and time.  Memory: Cooperative, follows commands  well. Recent and remote memory normal.  Attention, Concentration: Attention span and concentration are normal.  Language: Speech is clear and language is normal.  Fund of Knowledge: Aware of current events, vocabulary appropriate for patient age.   Cranial Nerves       II, III, IV, VI:  Pupils equal and reactive to light and near. Full eye movement without nystagmus   V Facial Sensation:  Normal. No weakness of masticatory muscles   VII:  No facial weakness or asymmetry   VIII Auditory Acuity:  Grossly normal   IX/X:  The uvula is midline; the palate elevates symmetrically   XI:  Normal sternocleidomastoid and trapezius strength   XII:  The tongue is midline. No atrophy or fasciculations.      Motor System:  Muscle Strength: 5/5 and symmetric in the upper and lower extremities. No pronation or drift.  Muscle Tone: Tone and muscle bulk are normal in the upper and lower extremities.      Coordination:  Intact finger-to-nose. No tremor.   Sensation:  Intact to light touch.      Other:        ED Treatments / Results  Labs (all labs ordered are listed, but only abnormal results are  displayed) Labs Reviewed - No data to display  EKG  EKG Interpretation None       Radiology Ct Head Wo Contrast  Result Date: 12/26/2017 CLINICAL DATA:  Headache.  Hypertension. EXAM: CT HEAD WITHOUT CONTRAST TECHNIQUE: Contiguous axial images were obtained from the base of the skull through the vertex without intravenous contrast. COMPARISON:  04/02/2017 FINDINGS: Brain: Age advanced cerebral and cerebellar atrophy again identified. Encephalomalacia involving the left frontal lobe is not significantly changed. Moderate low density in the periventricular white matter likely related to small vessel disease. No mass lesion, hemorrhage, hydrocephalus, acute infarct, intra-axial, or extra-axial fluid collection. Vascular: No hyperdense vessel or unexpected calcification. Skull: Normal Sinuses/Orbits: Normal imaged portions of the orbits and globes. Partially imaged opacification of the left maxillary sinus with sinus cortical thickening, similar. Clear mastoid air cells. Other: None. IMPRESSION: 1.  No acute intracranial abnormality. 2. Left frontal lobe encephalomalacia is similar and may relate to remote trauma. 3. Age advanced cerebral/cerebellar atrophy and small vessel ischemic change. 4. Chronic left maxillary sinusitis. Electronically Signed   By: Abigail Miyamoto M.D.   On: 12/26/2017 08:02    Procedures Procedures (including critical care time)  Medications Ordered in ED Medications  cloNIDine (CATAPRES) tablet 0.3 mg (not administered)     Initial Impression / Assessment and Plan / ED Course  I have reviewed the triage vital signs and the nursing notes.  Pertinent labs & imaging results that were available during my care of the patient were reviewed by me and considered in my medical decision making (see chart for details).     Richard [name] is a 47 year old male with past medical history significant for seizures, clotting disorder, alcohol abuse, hypertension, TIA, chronic headaches  who presents for headache.  He was seen yesterday for the same and a CT exam was obtained and demonstrated no acute abnormalities.  He was noted to be hypertensive that is consistent with his chronic hypertension.  He was prescribed a labetalol refill yesterday.   Of note, prior to calling EMS, the patient took home blood pressure medications.  He was initially hypertensive and on recheck his blood pressure improved to 178/100.  Patient likely suffering from hypertensive headache.  No evidence of neurological dysfunctions.  Her  headache is currently resolved.  Patient has refills for his blood pressure medications.  Patient is discharged home with strict return precautions, follow-up instructions, and educational materials.  Final Clinical Impressions(s) / ED Diagnoses   Final diagnoses:  Headache disorder  Hypertension, unspecified type    ED Discharge Orders    None       Elveria Rising, MD 12/27/17 1604    Elveria Rising, MD 12/27/17 1607    Tegeler, Gwenyth Allegra, MD 12/28/17 1726

## 2017-12-27 NOTE — ED Triage Notes (Signed)
Arrives EMS for headache, been here multiple time for same. Pt called EMS this morning for same but refused to come to ED> Not taking bp medication consistently.   160/82 Hr 72  16rr  94 RA cbg 138

## 2017-12-27 NOTE — ED Notes (Signed)
Pt told this NT that his headache worsened

## 2017-12-27 NOTE — ED Notes (Signed)
Called Pt for vitals recheck no answer

## 2017-12-27 NOTE — ED Triage Notes (Signed)
Per Pt, Pt is coming from home with complaints of headache. Pt was seen here earlier and reports the headache has gotten worse.

## 2017-12-27 NOTE — ED Triage Notes (Signed)
Pt arrives via EMS for headache, which has now resolved. LWBS earlier today. No neuro s/s

## 2017-12-27 NOTE — ED Triage Notes (Signed)
Pt denies any blurred vision or double vision. Denies any unilateral weakness.

## 2017-12-28 ENCOUNTER — Emergency Department (HOSPITAL_COMMUNITY)
Admission: EM | Admit: 2017-12-28 | Discharge: 2017-12-28 | Disposition: A | Discharge status: Home / Self Care | Payer: Medicare Other | Attending: Emergency Medicine | Admitting: Emergency Medicine

## 2017-12-28 ENCOUNTER — Other Ambulatory Visit: Payer: Self-pay

## 2017-12-28 ENCOUNTER — Emergency Department (HOSPITAL_COMMUNITY)
Admission: EM | Admit: 2017-12-28 | Discharge: 2017-12-28 | Disposition: A | Discharge status: Home / Self Care | Payer: Medicare Other

## 2017-12-28 MED ORDER — LABETALOL HCL 100 MG PO TABS: 200.0000 mg | ORAL_TABLET | Freq: Two times a day (BID) | ORAL | 0 refills | Status: DC

## 2017-12-28 MED ORDER — CLONIDINE HCL 0.3 MG PO TABS: 0.3000 mg | ORAL_TABLET | Freq: Three times a day (TID) | ORAL | 11 refills | Status: DC

## 2017-12-28 MED ORDER — GUAIFENESIN 100 MG/5ML PO LIQD: 100.0000 mg | ORAL | 0 refills | Status: DC | PRN

## 2017-12-28 NOTE — ED Notes (Signed)
Pts name called for a room no answer 

## 2017-12-28 NOTE — ED Notes (Signed)
Patient seen by case manager and provided needed prescriptions.

## 2017-12-28 NOTE — ED Notes (Signed)
Alvino Blood, RN advised this RN patient was supposed to see social worker to obtain prescriptions that he needed from 1/13.

## 2017-12-28 NOTE — ED Notes (Signed)
Patient arrived to nurse first stating he received some prescriptions on 1/13 that got left in the ambulance that took him home, pt states he was told to come here to pick up prescriptions. This Rn spoke with charge RN Lexine Baton regarding this, Charge Rn advised no knowledge of this. No notes found in patient's chart indicating he had spoke with someone about picking up his prescriptions. Pt advised I could not provide him with rx's from 1/13 due to MD that saw him not being here. Pts last dc summary reprinted, no prescriptions seen listed on last AVS. Pt advised he would need to check back in today to be seen in order to get prescriptions.

## 2017-12-29 ENCOUNTER — Emergency Department (HOSPITAL_COMMUNITY)
Admission: EM | Admit: 2017-12-29 | Discharge: 2017-12-29 | Discharge status: Against Medical Advice | Payer: Medicare Other | Attending: Emergency Medicine | Admitting: Emergency Medicine

## 2017-12-29 ENCOUNTER — Encounter (HOSPITAL_COMMUNITY): Payer: Self-pay | Admitting: Emergency Medicine

## 2017-12-29 ENCOUNTER — Other Ambulatory Visit: Payer: Self-pay | Admitting: *Deleted

## 2017-12-29 DIAGNOSIS — Z5321 Procedure and treatment not carried out due to patient leaving prior to being seen by health care provider: Secondary | ICD-10-CM | POA: Insufficient documentation

## 2017-12-29 DIAGNOSIS — R51 Headache: Secondary | ICD-10-CM | POA: Diagnosis present

## 2017-12-29 DIAGNOSIS — R52 Pain, unspecified: Secondary | ICD-10-CM | POA: Diagnosis not present

## 2017-12-29 DIAGNOSIS — G4489 Other headache syndrome: Secondary | ICD-10-CM | POA: Diagnosis not present

## 2017-12-29 NOTE — ED Notes (Signed)
Writer called for reassessing vitals, no response

## 2017-12-29 NOTE — Patient Outreach (Signed)
[facility]) Care Management  12/29/2017  Richard [name] 03/18/1971 [id number]   RN Health Coach attempted #1 screening outreach call to patient.  Patient was unavailable. HIPPA compliance voicemail message left with return callback number.  Plan: RN will call patient again within 14 days.  [facility] [phone]

## 2017-12-29 NOTE — ED Notes (Signed)
Called  No response from lobby 

## 2017-12-29 NOTE — ED Triage Notes (Signed)
Pt comes from home via EMS with complaints of left sided headache. Was seen at [facility] yesterday for same.  Pt has hx of hypertension but states his blood pressure medication is not working.  According to EMS he always asks to be placed on 2 L [place] for headache relief.  Pt ambulatory. A&O x4. BP 170/130 in route.

## 2017-12-29 NOTE — ED Triage Notes (Signed)
Patient c/o headache for month. Patient has HTN.

## 2017-12-31 DIAGNOSIS — G4489 Other headache syndrome: Secondary | ICD-10-CM | POA: Diagnosis not present

## 2017-12-31 NOTE — ED Notes (Signed)
12/31/2017, Called pt. For a follow, no answer.

## 2018-01-02 ENCOUNTER — Emergency Department (HOSPITAL_COMMUNITY)
Admission: EM | Admit: 2018-01-02 | Discharge: 2018-01-02 | Disposition: A | Discharge status: Home / Self Care | Payer: Medicare Other | Attending: Emergency Medicine | Admitting: Emergency Medicine

## 2018-01-02 ENCOUNTER — Encounter (HOSPITAL_COMMUNITY): Payer: Self-pay | Admitting: Emergency Medicine

## 2018-01-02 DIAGNOSIS — R51 Headache: Secondary | ICD-10-CM | POA: Diagnosis not present

## 2018-01-02 DIAGNOSIS — F17210 Nicotine dependence, cigarettes, uncomplicated: Secondary | ICD-10-CM | POA: Diagnosis not present

## 2018-01-02 DIAGNOSIS — J45909 Unspecified asthma, uncomplicated: Secondary | ICD-10-CM | POA: Diagnosis not present

## 2018-01-02 DIAGNOSIS — Z79899 Other long term (current) drug therapy: Secondary | ICD-10-CM | POA: Insufficient documentation

## 2018-01-02 DIAGNOSIS — I10 Essential (primary) hypertension: Secondary | ICD-10-CM | POA: Insufficient documentation

## 2018-01-02 DIAGNOSIS — R0789 Other chest pain: Secondary | ICD-10-CM | POA: Diagnosis not present

## 2018-01-02 DIAGNOSIS — R519 Headache, unspecified: Secondary | ICD-10-CM

## 2018-01-02 DIAGNOSIS — R030 Elevated blood-pressure reading, without diagnosis of hypertension: Secondary | ICD-10-CM | POA: Diagnosis not present

## 2018-01-02 DIAGNOSIS — J449 Chronic obstructive pulmonary disease, unspecified: Secondary | ICD-10-CM | POA: Insufficient documentation

## 2018-01-02 DIAGNOSIS — G4489 Other headache syndrome: Secondary | ICD-10-CM | POA: Diagnosis not present

## 2018-01-02 LAB — COMPREHENSIVE METABOLIC PANEL
ALT: 17 U/L (ref 17–63)
ANION GAP: 10 (ref 5–15)
AST: 19 U/L (ref 15–41)
Albumin: 3.6 g/dL (ref 3.5–5.0)
Alkaline Phosphatase: 66 U/L (ref 38–126)
BUN: 10 mg/dL (ref 6–20)
CHLORIDE: 109 mmol/L (ref 101–111)
CO2: 21 mmol/L — ABNORMAL LOW (ref 22–32)
Calcium: 8.7 mg/dL — ABNORMAL LOW (ref 8.9–10.3)
Creatinine, Ser: 0.98 mg/dL (ref 0.61–1.24)
Glucose, Bld: 107 mg/dL — ABNORMAL HIGH (ref 65–99)
POTASSIUM: 4 mmol/L (ref 3.5–5.1)
Sodium: 140 mmol/L (ref 135–145)
TOTAL PROTEIN: 6.4 g/dL — AB (ref 6.5–8.1)
Total Bilirubin: 0.9 mg/dL (ref 0.3–1.2)

## 2018-01-02 LAB — CBC WITH DIFFERENTIAL/PLATELET
BASOS ABS: 0 10*3/uL (ref 0.0–0.1)
Basophils Relative: 0 %
EOS PCT: 0 %
Eosinophils Absolute: 0 10*3/uL (ref 0.0–0.7)
HCT: 45.7 % (ref 39.0–52.0)
Hemoglobin: 15.9 g/dL (ref 13.0–17.0)
LYMPHS ABS: 2.1 10*3/uL (ref 0.7–4.0)
LYMPHS PCT: 21 %
MCH: 32.4 pg (ref 26.0–34.0)
MCHC: 34.8 g/dL (ref 30.0–36.0)
MCV: 93.3 fL (ref 78.0–100.0)
MONO ABS: 0.8 10*3/uL (ref 0.1–1.0)
MONOS PCT: 8 %
Neutro Abs: 7.1 10*3/uL (ref 1.7–7.7)
Neutrophils Relative %: 71 %
PLATELETS: 240 10*3/uL (ref 150–400)
RBC: 4.9 MIL/uL (ref 4.22–5.81)
RDW: 13.8 % (ref 11.5–15.5)
WBC: 10 10*3/uL (ref 4.0–10.5)

## 2018-01-02 MED ORDER — GABAPENTIN 300 MG PO CAPS
300.0000 mg | ORAL_CAPSULE | Freq: Three times a day (TID) | ORAL | 0 refills | Status: DC
Start: 2018-01-02 — End: 2018-06-21

## 2018-01-02 NOTE — ED Provider Notes (Signed)
[facility] EMERGENCY DEPARTMENT Provider Note   CSN: [id number] Arrival date & time: 01/02/18  0208     History   Chief Complaint Chief Complaint  Patient presents with  . Headache    Hypertensive    HPI Richard [name] is a 47 y.o. male.  HPI   Reports continuing headaches. Has been seen in ED for the same multiple times. Reports he is here because he wants to know why he is having them, why he is waking up.  Reports soreness top of head and left side of head.  Reports headache feels like a someone pulling a rubber band and making it tighter. Last night pain lasted about 30 minutes.  Normally goes away a lot faster. Reports since being here in the waiting room his headache has subsided.  Has hx of DVT, was on blood thinners but stopped a long time ago, thinks it was in the setting of a leg surgery.  Takes goody powders and aspirin, takes vinegar, all seem to help some with headaches.    Past Medical History:  Diagnosis Date  . Asthma   . Brain bleed ([place])   . Hypertension   . Seizures ([place])    last episode 03/2013    Patient Active Problem List   Diagnosis Date Noted  . Swollen testicle   . Atelectasis   . Syncope 08/26/2015  . Acute encephalopathy 08/26/2015  . Seizures ([place])   . Acute pulmonary edema (HCC)   . Lower leg DVT (deep venous thromboembolism), acute ([place])   . Protein-calorie malnutrition, severe ([place]) 02/19/2015  . Bacteremia due to Staphylococcus 02/17/2015  . Alcohol withdrawal delirium ([place])   . Acute respiratory failure with hypoxia ([place]) 02/13/2015  . Acute respiratory failure with hypoxemia ([place])   . Altered mental status   . Metabolic acidosis   . Essential hypertension   . Encephalopathy 02/11/2015  . Alcohol intoxication ([place]) 02/11/2015  . Lactic acidosis 02/11/2015  . Bleeding from the nose 03/19/2014  . TIA (transient ischemic attack) 12/24/2013  . Weakness 12/23/2013  . Left-sided weakness 12/23/2013  . Malignant  hypertension 12/23/2013  . Seizure disorder ([place]) 12/23/2013  . Vomiting 06/18/2013  . Chest pain 06/18/2013  . Hypertensive urgency 10/30/2012  . Migraine variant 10/30/2012  . TOBACCO ABUSE 01/11/2008  . HYPERTENSION, BENIGN 11/24/2007  . CHRONIC FRONTAL SINUSITIS 11/24/2007  . Convulsions ([place]) 11/24/2007  . Headache(784.0) 11/24/2007    Past Surgical History:  Procedure Laterality Date  . LEG SURGERY         Home Medications    Prior to Admission medications   Medication Sig Start Date End Date Taking? Authorizing Provider  clonazePAM (KLONOPIN) 0.5 MG tablet Take 1 tablet (0.5 mg total) by mouth 2 (two) times daily. 03/01/15   Reyne Dumas, MD  cloNIDine (CATAPRES) 0.3 MG tablet Take 1 tablet (0.3 mg total) by mouth 3 (three) times daily. Patient not taking: Reported on 12/27/2017 12/26/17   Valarie Merino, MD  cloNIDine (CATAPRES) 0.3 MG tablet Take 1 tablet (0.3 mg total) by mouth 3 (three) times daily. 12/28/17   Hedges, Dellis Filbert, PA-C  folic acid (FOLVITE) 1 MG tablet Take 1 tablet (1 mg total) by mouth daily. Patient not taking: Reported on 12/27/2017 03/01/15   Reyne Dumas, MD  gabapentin (NEURONTIN) 300 MG capsule Take 1 capsule (300 mg total) by mouth 3 (three) times daily. 01/02/18   Gareth Morgan, MD  guaiFENesin (ROBITUSSIN) 100 MG/5ML liquid Take 5-10 mLs (100-200 mg total)  by mouth every 4 (four) hours as needed for cough. 12/28/17   Hedges, Dellis Filbert, PA-C  hydrALAZINE (APRESOLINE) 50 MG tablet Take 1 tablet (50 mg total) by mouth every 8 (eight) hours. 03/20/14   Charlynne Cousins, MD  labetalol (NORMODYNE) 100 MG tablet Take 2 tablets (200 mg total) by mouth 2 (two) times daily. 12/28/17   Hedges, Dellis Filbert, PA-C  labetalol (NORMODYNE) 200 MG tablet Take 1 tablet (200 mg total) by mouth 2 (two) times daily. Patient not taking: Reported on 12/27/2017 09/10/15   Corey Harold, NP  levETIRAcetam (KEPPRA) 500 MG tablet Take 1 tablet (500 mg total) by mouth 2 (two)  times daily. Patient not taking: Reported on 12/27/2017 03/01/15   Reyne Dumas, MD  metoCLOPramide (REGLAN) 10 MG tablet Take 1 tablet (10 mg total) by mouth every 6 (six) hours as needed for nausea (or headache). Patient not taking: Reported on 04/22/2584 2/77/82   Delora Fuel, MD  nicotine (NICODERM CQ - DOSED IN MG/24 HOURS) 21 mg/24hr patch Place 1 patch (21 mg total) onto the skin daily. Patient not taking: Reported on 08/25/2015 03/01/15   Reyne Dumas, MD  phenytoin (DILANTIN) 50 MG tablet Chew 50 mg by mouth 2 (two) times daily.    [provider]  QUEtiapine (SEROQUEL) 200 MG tablet Take 1 tablet (200 mg total) by mouth 2 (two) times daily. 09/10/15   Corey Harold, NP  thiamine 100 MG tablet Take 1 tablet (100 mg total) by mouth daily. Patient not taking: Reported on 12/27/2017 03/01/15   Reyne Dumas, MD    Family History Family History  Problem Relation Age of Onset  . Cancer Father   . Diabetes Mellitus II Sister     Social History Social History   Tobacco Use  . Smoking status: Current Every Day Smoker    Packs/day: 1.00    Years: 26.00    Pack years: 26.00    Types: Cigarettes  . Smokeless tobacco: Never Used  Substance Use Topics  . Alcohol use: Yes  . Drug use: Yes    Types: Cocaine    Comment: last use several years ago      Allergies   Dilaudid [hydromorphone hcl]   Review of Systems Review of Systems  Constitutional: Negative for fever.  HENT: Negative for sore throat.   Eyes: Negative for visual disturbance.  Respiratory: Negative for shortness of breath.   Cardiovascular: Negative for chest pain.  Gastrointestinal: Negative for abdominal pain, nausea and vomiting.  Genitourinary: Negative for difficulty urinating.  Musculoskeletal: Negative for back pain and neck stiffness.  Skin: Negative for rash.  Neurological: Positive for headaches. Negative for syncope, facial asymmetry, weakness and numbness.     Physical Exam Updated Vital  Signs BP (!) 190/123 (BP Location: Right Arm)   Pulse 72   Temp 98.1 F (36.7 C) (Oral)   Resp 18   SpO2 93%   Physical Exam  Constitutional: He is oriented to person, place, and time. He appears well-developed and well-nourished. No distress.  HENT:  Head: Normocephalic and atraumatic.  Eyes: Conjunctivae and EOM are normal.  Neck: Normal range of motion.  Cardiovascular: Normal rate, regular rhythm, normal heart sounds and intact distal pulses. Exam reveals no gallop and no friction rub.  No murmur heard. Pulmonary/Chest: Effort normal and breath sounds normal. No respiratory distress. He has no wheezes. He has no rales.  Abdominal: Soft. He exhibits no distension. There is no tenderness. There is no guarding.  Musculoskeletal: He  exhibits no edema.  Neurological: He is alert and oriented to person, place, and time. He has normal strength. No cranial nerve deficit or sensory deficit. Coordination and gait normal. GCS eye subscore is 4. GCS verbal subscore is 5. GCS motor subscore is 6.  Skin: Skin is warm and dry. He is not diaphoretic.  Nursing note and vitals reviewed.    ED Treatments / Results  Labs (all labs ordered are listed, but only abnormal results are displayed) Labs Reviewed  COMPREHENSIVE METABOLIC PANEL - Abnormal; Notable for the following components:      Result Value   CO2 21 (*)    Glucose, Bld 107 (*)    Calcium 8.7 (*)    Total Protein 6.4 (*)    All other components within normal limits  CBC WITH DIFFERENTIAL/PLATELET    EKG  EKG Interpretation None       Radiology No results found.  Procedures Procedures (including critical care time)  Medications Ordered in ED Medications - No data to display   Initial Impression / Assessment and Plan / ED Course  I have reviewed the triage vital signs and the nursing notes.  Pertinent labs & imaging results that were available during my care of the patient were reviewed by me and considered in my  medical decision making (see chart for details).     47 year old male with a history of hypertension, seizures, prior intracerebral hemorrhage, presents with concern for headaches.  Patient reports he has been seen in the emergency department several times for the same concern.  Reports that last night he came in due to headache, but while he was in the waiting room it has resolved.  He is significantly hypertensive, but does not yet had his morning blood pressure medications, including clonidine.  Offered to give these medications in the emergency department, however patient declines.  Reports he will take them at home.  He denies any symptoms of blood pressure emergency, including no chest pain, shortness of breath, numbness, weakness, or other strokelike symptoms.  His neurologic exam is normal.  Headache is slow onset, chronic, have low suspicion for subarachnoid hemorrhage.  No fevers to suggest meningitis.  Discussed with patient given his history of prior DVT, (although this may have been provoked by surgery, and he has had no recent immobilization/surgery) that headache worse laying down could represent a dural venous thrombosis and discussed possibility of doing MRI.   Patient reports he is tired and would like to leave the ED. Given intermittent headache that does not worsen with valsalva, no recent thrombotic risk factors, feel CVT is less likely, and it is reasonable for him to follow up with PCP. Recommend he find PCP ASAP (has [facility]), and follow up with Neurology. Will initiate gabantin for headache prevention. Recommend close follow up for htn, taking meds as rx.   Final Clinical Impressions(s) / ED Diagnoses   Final diagnoses:  Hypertension, unspecified type  Headache disorder    ED Discharge Orders        Ordered    gabapentin (NEURONTIN) 300 MG capsule  3 times daily     01/02/18 8768       Gareth Morgan, MD 01/02/18 1059

## 2018-01-02 NOTE — ED Triage Notes (Signed)
Patient arrived with EMS from home reports chronic headache for 1 1/2 months , denies head injury , no nausea or fever , hypertensive 200/110 by EMS .

## 2018-01-03 ENCOUNTER — Other Ambulatory Visit: Payer: Self-pay | Admitting: *Deleted

## 2018-01-03 NOTE — Patient Outreach (Signed)
[facility]) Care Management  01/03/2018  Richard [name] [birth year] [id number]   RN Health Coach attempted #2 screening outreach call to patient.  Patient was unavailable. HIPPA compliance voicemail message left with return callback number.  Plan: RN sent unsuccessful outreach letter.   RN will call again within [place] days  [facility] Management [phone]

## 2018-01-05 ENCOUNTER — Encounter: Payer: Self-pay | Admitting: Neurology

## 2018-01-10 ENCOUNTER — Other Ambulatory Visit: Payer: Self-pay | Admitting: *Deleted

## 2018-01-10 NOTE — Patient Outreach (Signed)
[facility]) Care Management  01/10/2018  Greco Gastelum 08/09/1971 [id number]   RN Health Coach attempted#3 follow up outreach call to patient.  Patient was unavailable. HIPPA compliance voicemail message left with return callback number.  Plan: RN will close case RN will send CMA closure message  [facility] Management [phone]

## 2018-04-19 ENCOUNTER — Ambulatory Visit: Payer: Self-pay | Admitting: Neurology

## 2018-05-17 DIAGNOSIS — H5213 Myopia, bilateral: Secondary | ICD-10-CM | POA: Diagnosis not present

## 2018-06-21 ENCOUNTER — Encounter (HOSPITAL_COMMUNITY): Payer: Self-pay | Admitting: Emergency Medicine

## 2018-06-21 ENCOUNTER — Emergency Department (HOSPITAL_COMMUNITY)
Admission: EM | Admit: 2018-06-21 | Discharge: 2018-06-21 | Disposition: A | Discharge status: Home / Self Care | Payer: Medicare Other | Attending: Emergency Medicine | Admitting: Emergency Medicine

## 2018-06-21 DIAGNOSIS — T63484A Toxic effect of venom of other arthropod, undetermined, initial encounter: Secondary | ICD-10-CM | POA: Diagnosis not present

## 2018-06-21 DIAGNOSIS — I499 Cardiac arrhythmia, unspecified: Secondary | ICD-10-CM | POA: Diagnosis not present

## 2018-06-21 DIAGNOSIS — Y939 Activity, unspecified: Secondary | ICD-10-CM | POA: Diagnosis not present

## 2018-06-21 DIAGNOSIS — F17210 Nicotine dependence, cigarettes, uncomplicated: Secondary | ICD-10-CM | POA: Insufficient documentation

## 2018-06-21 DIAGNOSIS — S50862A Insect bite (nonvenomous) of left forearm, initial encounter: Secondary | ICD-10-CM | POA: Insufficient documentation

## 2018-06-21 DIAGNOSIS — J45909 Unspecified asthma, uncomplicated: Secondary | ICD-10-CM | POA: Diagnosis not present

## 2018-06-21 DIAGNOSIS — Y999 Unspecified external cause status: Secondary | ICD-10-CM | POA: Insufficient documentation

## 2018-06-21 DIAGNOSIS — Y929 Unspecified place or not applicable: Secondary | ICD-10-CM | POA: Insufficient documentation

## 2018-06-21 DIAGNOSIS — I10 Essential (primary) hypertension: Secondary | ICD-10-CM | POA: Diagnosis not present

## 2018-06-21 DIAGNOSIS — W57XXXA Bitten or stung by nonvenomous insect and other nonvenomous arthropods, initial encounter: Secondary | ICD-10-CM | POA: Insufficient documentation

## 2018-06-21 DIAGNOSIS — S59912A Unspecified injury of left forearm, initial encounter: Secondary | ICD-10-CM | POA: Diagnosis present

## 2018-06-21 DIAGNOSIS — Z79899 Other long term (current) drug therapy: Secondary | ICD-10-CM | POA: Insufficient documentation

## 2018-06-21 NOTE — ED Notes (Signed)
Bed: WTR6 Expected date: 06/21/18 Expected time: 6:19 PM Means of arrival: Ambulance Comments: Bug Bites, HTN

## 2018-06-21 NOTE — ED Provider Notes (Signed)
[facility] DEPT Provider Note   CSN: [id number] Arrival date & time: 06/21/18  1820     History   Chief Complaint Chief Complaint  Patient presents with  . Insect Bite    HPI Richard [name] is a 47 y.o. male who presents with insect bite. PMH significant for hypertension, asthma, alcohol abuse, seizures, TIA and prior intracranial hemorrhage.  He states that earlier today he felt something on his left forearm and he hit it.  He thinks he may have been bit by a spider.  He has mild redness and swelling over the anterior left forearm.  He also has bite marks over the left lower leg.  He does work outside.  He denies any fevers.  He states he felt very "woozy" when it initially happens but now feels normal and is requesting discharge.  HPI  Past Medical History:  Diagnosis Date  . Asthma   . Brain bleed ([place])   . Hypertension   . Seizures ([place])    last episode 03/2013    Patient Active Problem List   Diagnosis Date Noted  . Swollen testicle   . Atelectasis   . Syncope 08/26/2015  . Acute encephalopathy 08/26/2015  . Seizures ([place])   . Acute pulmonary edema (HCC)   . Lower leg DVT (deep venous thromboembolism), acute ([place])   . Protein-calorie malnutrition, severe ([place]) 02/19/2015  . Bacteremia due to Staphylococcus 02/17/2015  . Alcohol withdrawal delirium ([place])   . Acute respiratory failure with hypoxia ([place]) 02/13/2015  . Acute respiratory failure with hypoxemia ([place])   . Altered mental status   . Metabolic acidosis   . Essential hypertension   . Encephalopathy 02/11/2015  . Alcohol intoxication ([place]) 02/11/2015  . Lactic acidosis 02/11/2015  . Bleeding from the nose 03/19/2014  . TIA (transient ischemic attack) 12/24/2013  . Weakness 12/23/2013  . Left-sided weakness 12/23/2013  . Malignant hypertension 12/23/2013  . Seizure disorder ([place]) 12/23/2013  . Vomiting 06/18/2013  . Chest pain 06/18/2013  . Hypertensive urgency 10/30/2012    . Migraine variant 10/30/2012  . TOBACCO ABUSE 01/11/2008  . HYPERTENSION, BENIGN 11/24/2007  . CHRONIC FRONTAL SINUSITIS 11/24/2007  . Convulsions ([place]) 11/24/2007  . Headache(784.0) 11/24/2007    Past Surgical History:  Procedure Laterality Date  . LEG SURGERY          Home Medications    Prior to Admission medications   Medication Sig Start Date End Date Taking? Authorizing Provider  clonazePAM (KLONOPIN) 0.5 MG tablet Take 1 tablet (0.5 mg total) by mouth 2 (two) times daily. 03/01/15   Reyne Dumas, MD  cloNIDine (CATAPRES) 0.3 MG tablet Take 1 tablet (0.3 mg total) by mouth 3 (three) times daily. Patient not taking: Reported on 12/27/2017 12/26/17   Valarie Merino, MD  cloNIDine (CATAPRES) 0.3 MG tablet Take 1 tablet (0.3 mg total) by mouth 3 (three) times daily. 12/28/17   Hedges, Dellis Filbert, PA-C  folic acid (FOLVITE) 1 MG tablet Take 1 tablet (1 mg total) by mouth daily. Patient not taking: Reported on 12/27/2017 03/01/15   Reyne Dumas, MD  gabapentin (NEURONTIN) 300 MG capsule Take 1 capsule (300 mg total) by mouth 3 (three) times daily. 01/02/18   Gareth Morgan, MD  guaiFENesin (ROBITUSSIN) 100 MG/5ML liquid Take 5-10 mLs (100-200 mg total) by mouth every 4 (four) hours as needed for cough. 12/28/17   Hedges, Dellis Filbert, PA-C  hydrALAZINE (APRESOLINE) 50 MG tablet Take 1 tablet (50 mg total) by mouth every 8 (  eight) hours. 03/20/14   Charlynne Cousins, MD  labetalol (NORMODYNE) 100 MG tablet Take 2 tablets (200 mg total) by mouth 2 (two) times daily. 12/28/17   Hedges, Dellis Filbert, PA-C  labetalol (NORMODYNE) 200 MG tablet Take 1 tablet (200 mg total) by mouth 2 (two) times daily. Patient not taking: Reported on 12/27/2017 09/10/15   Corey Harold, NP  levETIRAcetam (KEPPRA) 500 MG tablet Take 1 tablet (500 mg total) by mouth 2 (two) times daily. Patient not taking: Reported on 12/27/2017 03/01/15   Reyne Dumas, MD  metoCLOPramide (REGLAN) 10 MG tablet Take 1 tablet (10 mg total)  by mouth every 6 (six) hours as needed for nausea (or headache). Patient not taking: Reported on 08/16/8332 8/32/91   Delora Fuel, MD  nicotine (NICODERM CQ - DOSED IN MG/24 HOURS) 21 mg/24hr patch Place 1 patch (21 mg total) onto the skin daily. Patient not taking: Reported on 08/25/2015 03/01/15   Reyne Dumas, MD  phenytoin (DILANTIN) 50 MG tablet Chew 50 mg by mouth 2 (two) times daily.    [provider]  QUEtiapine (SEROQUEL) 200 MG tablet Take 1 tablet (200 mg total) by mouth 2 (two) times daily. 09/10/15   Corey Harold, NP  thiamine 100 MG tablet Take 1 tablet (100 mg total) by mouth daily. Patient not taking: Reported on 12/27/2017 03/01/15   Reyne Dumas, MD    Family History Family History  Problem Relation Age of Onset  . Cancer Father   . Diabetes Mellitus II Sister     Social History Social History   Tobacco Use  . Smoking status: Current Every Day Smoker    Packs/day: 1.00    Years: 26.00    Pack years: 26.00    Types: Cigarettes  . Smokeless tobacco: Never Used  Substance Use Topics  . Alcohol use: Yes  . Drug use: Yes    Types: Cocaine    Comment: last use several years ago      Allergies   Dilaudid [hydromorphone hcl]   Review of Systems Review of Systems  Constitutional: Negative for fever.  Skin: Positive for rash and wound.  Neurological: Positive for light-headedness.  All other systems reviewed and are negative.    Physical Exam Updated Vital Signs BP (!) 166/111 (BP Location: Left Arm)   Pulse 88   Temp 98.5 F (36.9 C) (Oral)   Resp 20   SpO2 99%   Physical Exam  Constitutional: He is oriented to person, place, and time. He appears well-developed and well-nourished. No distress.  Chronically ill-appearing, disheveled  HENT:  Head: Normocephalic and atraumatic.  Eyes: Pupils are equal, round, and reactive to light. Conjunctivae are normal. Right eye exhibits no discharge. Left eye exhibits no discharge. No scleral icterus.   Neck: Normal range of motion.  Cardiovascular: Normal rate and regular rhythm.  Pulmonary/Chest: Effort normal and breath sounds normal. No respiratory distress.  Abdominal: He exhibits no distension.  Neurological: He is alert and oriented to person, place, and time.  Skin: Skin is warm and dry.  Small 1 x 1 cm area of erythema and induration with a central wound.  No fluctuance or drainage from the area  Several excoriations over the left lower leg and ankle which are bleeding.   Psychiatric: He has a normal mood and affect. His behavior is normal.  Nursing note and vitals reviewed.    ED Treatments / Results  Labs (all labs ordered are listed, but only abnormal results are displayed) Labs  Reviewed - No data to display  EKG None  Radiology No results found.  Procedures Procedures (including critical care time)  Medications Ordered in ED Medications - No data to display   Initial Impression / Assessment and Plan / ED Course  I have reviewed the triage vital signs and the nursing notes.  Pertinent labs & imaging results that were available during my care of the patient were reviewed by me and considered in my medical decision making (see chart for details).  47 year old male presents with possible insect bite of the left forearm.  The area appears like it has been present longer than just today.  There is no obvious fluctuance or drainage to suggest abscess at this time.  The patient does not want to take any medication at this time.  He is advised wound care, warm compresses and to return for worsening symptoms which she is agreeable to.  The excoriations on his lower leg is not consistent with infection.  He was noted to be hypertensive today.  He states that he needs to just go home and take his blood pressure medication.  He follows with [facility] and wellness.   Final Clinical Impressions(s) / ED Diagnoses   Final diagnoses:  Insect bite of left forearm, initial  encounter  Hypertension, unspecified type    ED Discharge Orders    None       Recardo Evangelist, PA-C 06/21/18 1917    Lacretia Leigh, MD 06/22/18 1415

## 2018-06-21 NOTE — ED Triage Notes (Signed)
Patient here from home with complaints of insect bites on left leg and left arm.

## 2018-06-21 NOTE — Discharge Instructions (Signed)
Keep area clean and dry Keep it covered with a bandage Please return if worsening (If you get a fever, worsening redness, swelling, or drainage)

## 2018-06-22 ENCOUNTER — Other Ambulatory Visit: Payer: Self-pay | Admitting: *Deleted

## 2018-06-22 NOTE — Patient Outreach (Signed)
[facility]) Care Management  06/22/2018  Desmon Hitchner Sep 24, 1971 [id number]   Telephone Screen  Referral Date:  06/22/2018 Referral Source:  [facility] ED Census Reason for Referral:  6 or more ED visits in the past 6 months Insurance:  Medicare   Outreach Attempt:  Outreach attempt #1 to patient for telephone screening. No answer. RN Health Coach left HIPAA compliant voicemail message along with contact information.  Plan:  RN Health Coach will send unsuccessful outreach letter to patient.  RN Health Coach will make another outreach attempt to patient within 3-4 business days if no return call back from patient.   [place] [phone] [email or web address]

## 2018-06-23 ENCOUNTER — Other Ambulatory Visit: Payer: Self-pay | Admitting: *Deleted

## 2018-06-23 NOTE — Patient Outreach (Addendum)
[facility]) Care Management  06/23/2018  Michal Callicott 12/03/1971 [id number]   Telephone Screen  Referral Date:  06/22/2018 Referral Source:  [facility] ED Census Reason for Referral:  6 or more ED visits in the past 6 months Insurance:  Medicare   Outreach Attempt:  Outreach attempt #2 to patient for telephone screening. No answer. RN Health Coach left HIPAA compliant voicemail message along with contact information.  As [place] completing documentation, patient returned telephone call.  HIPAA verified with patient.  Verifies address listed is his mother's and prefers to keep this listed as this is where he is and receives mail.  Patient states he currently does not have primary care provider.  Offered patient number to establish primary care.  States he is looking to establish care with [facility]ness.  [facility] services reviewed and discussed with patient. Patient encouraged to contact [facility] when he establishes a primary care provider.  Stated he would call [facility] when he did so.  Plan:  RN Health Coach will close case based on patient ineligible for benefit without primary care provider established.  [place] [phone] [email or web address]

## 2018-11-02 ENCOUNTER — Emergency Department (HOSPITAL_COMMUNITY)
Admission: EM | Admit: 2018-11-02 | Discharge: 2018-11-02 | Disposition: A | Discharge status: Home / Self Care | Payer: Medicare Other | Attending: Emergency Medicine | Admitting: Emergency Medicine

## 2018-11-02 ENCOUNTER — Encounter (HOSPITAL_COMMUNITY): Payer: Self-pay | Admitting: *Deleted

## 2018-11-02 DIAGNOSIS — L301 Dyshidrosis [pompholyx]: Secondary | ICD-10-CM | POA: Diagnosis not present

## 2018-11-02 DIAGNOSIS — F17210 Nicotine dependence, cigarettes, uncomplicated: Secondary | ICD-10-CM | POA: Insufficient documentation

## 2018-11-02 DIAGNOSIS — Z79899 Other long term (current) drug therapy: Secondary | ICD-10-CM | POA: Diagnosis not present

## 2018-11-02 DIAGNOSIS — R21 Rash and other nonspecific skin eruption: Secondary | ICD-10-CM | POA: Diagnosis present

## 2018-11-02 DIAGNOSIS — J45909 Unspecified asthma, uncomplicated: Secondary | ICD-10-CM | POA: Diagnosis not present

## 2018-11-02 DIAGNOSIS — I10 Essential (primary) hypertension: Secondary | ICD-10-CM | POA: Insufficient documentation

## 2018-11-02 DIAGNOSIS — Z8673 Personal history of transient ischemic attack (TIA), and cerebral infarction without residual deficits: Secondary | ICD-10-CM | POA: Diagnosis not present

## 2018-11-02 MED ORDER — CLONIDINE HCL 0.1 MG PO TABS
0.3000 mg | ORAL_TABLET | Freq: Once | ORAL | Status: AC
  Administered 2018-11-02: 11:00:00 0.3 mg via ORAL
  Filled 2018-11-02: qty 1

## 2018-11-02 MED ORDER — PREDNISONE 20 MG PO TABS: 40.0000 mg | ORAL_TABLET | Freq: Every day | ORAL | 0 refills | Status: DC

## 2018-11-02 NOTE — ED Triage Notes (Signed)
Pt in c/o rash to his left wrist, left hand and right hand, first noticed it last week, rash is scaling an dry, c/o pain to areas and itching, no history of same

## 2018-11-02 NOTE — Discharge Instructions (Addendum)
Please read the attached information, please follow-up with your primary care provider within the next week, return immediately if any new or worsening signs or symptoms present.

## 2018-11-02 NOTE — ED Provider Notes (Signed)
[facility] EMERGENCY DEPARTMENT Provider Note   CSN: [id number] Arrival date & time: 11/02/18  1019     History   Chief Complaint Chief Complaint  Patient presents with  . Rash    HPI Richard [name] is a 47 y.o. male.  HPI  47 year old male presents today with complaints of rash to his left volar wrist, and interdigital spaces.  Patient notes symptoms have been going on for approximately 2 weeks, extremely itchy.  Patient does note that he was exposed to round out prior to onset of symptoms.  He denies any worsening of symptoms, denies any other areas of skin involvement.  No history of the same.  No history of eczema.  Patient also notes a history of hypertension and forgot to take his blood pressure medication today.  He denies any chest pain shortness of breath or neurological deficits.  Past Medical History:  Diagnosis Date  . Asthma   . Brain bleed ([place])   . Hypertension   . Seizures ([place])    last episode 03/2013    Patient Active Problem List   Diagnosis Date Noted  . Swollen testicle   . Atelectasis   . Syncope 08/26/2015  . Acute encephalopathy 08/26/2015  . Seizures ([place])   . Acute pulmonary edema (HCC)   . Lower leg DVT (deep venous thromboembolism), acute ([place])   . Protein-calorie malnutrition, severe ([place]) 02/19/2015  . Bacteremia due to Staphylococcus 02/17/2015  . Alcohol withdrawal delirium ([place])   . Acute respiratory failure with hypoxia ([place]) 02/13/2015  . Acute respiratory failure with hypoxemia ([place])   . Altered mental status   . Metabolic acidosis   . Essential hypertension   . Encephalopathy 02/11/2015  . Alcohol intoxication ([place]) 02/11/2015  . Lactic acidosis 02/11/2015  . Bleeding from the nose 03/19/2014  . TIA (transient ischemic attack) 12/24/2013  . Weakness 12/23/2013  . Left-sided weakness 12/23/2013  . Malignant hypertension 12/23/2013  . Seizure disorder ([place]) 12/23/2013  . Vomiting 06/18/2013  . Chest pain  06/18/2013  . Hypertensive urgency 10/30/2012  . Migraine variant 10/30/2012  . TOBACCO ABUSE 01/11/2008  . HYPERTENSION, BENIGN 11/24/2007  . CHRONIC FRONTAL SINUSITIS 11/24/2007  . Convulsions ([place]) 11/24/2007  . Headache(784.0) 11/24/2007    Past Surgical History:  Procedure Laterality Date  . LEG SURGERY          Home Medications    Prior to Admission medications   Medication Sig Start Date End Date Taking? Authorizing Provider  Aspirin-Acetaminophen-Caffeine (GOODYS EXTRA STRENGTH) [phone] MG PACK Take 2 packets by mouth daily as needed (pain).    [provider]  cloNIDine (CATAPRES) 0.3 MG tablet Take 1 tablet (0.3 mg total) by mouth 3 (three) times daily. 12/28/17   Bruchy Mikel, Dellis Filbert, PA-C  phenytoin (DILANTIN) 50 MG tablet Chew 50 mg by mouth 2 (two) times daily.    [provider]  predniSONE (DELTASONE) 20 MG tablet Take 2 tablets (40 mg total) by mouth daily. 11/02/18   Okey Regal, PA-C    Family History Family History  Problem Relation Age of Onset  . Cancer Father   . Diabetes Mellitus II Sister     Social History Social History   Tobacco Use  . Smoking status: Current Every Day Smoker    Packs/day: 1.00    Years: 26.00    Pack years: 26.00    Types: Cigarettes  . Smokeless tobacco: Never Used  Substance Use Topics  . Alcohol use: Yes  . Drug use:  Yes    Types: Cocaine    Comment: last use several years ago      Allergies   Dilaudid [hydromorphone hcl]   Review of Systems Review of Systems  All other systems reviewed and are negative.    Physical Exam Updated Vital Signs BP (!) 164/116 (BP Location: Right Arm)   Pulse 78   Temp 98.1 F (36.7 C) (Oral)   Resp 20   SpO2 98%   Physical Exam  Constitutional: He is oriented to person, place, and time. He appears well-developed and well-nourished.  HENT:  Head: Normocephalic and atraumatic.  Eyes: Pupils are equal, round, and reactive to light. Conjunctivae are  normal. Right eye exhibits no discharge. Left eye exhibits no discharge. No scleral icterus.  Neck: Normal range of motion. No JVD present. No tracheal deviation present.  Cardiovascular: Normal rate, regular rhythm and normal heart sounds.  Pulmonary/Chest: Effort normal. No stridor.  Neurological: He is alert and oriented to person, place, and time. Coordination normal.  Skin:  Vesicular rash with dry areas noted and pictures  Psychiatric: He has a normal mood and affect. His behavior is normal. Judgment and thought content normal.  Nursing note and vitals reviewed.          ED Treatments / Results  Labs (all labs ordered are listed, but only abnormal results are displayed) Labs Reviewed - No data to display  EKG None  Radiology No results found.  Procedures Procedures (including critical care time)  Medications Ordered in ED Medications  cloNIDine (CATAPRES) tablet 0.3 mg (0.3 mg Oral Given 11/02/18 1041)     Initial Impression / Assessment and Plan / ED Course  I have reviewed the triage vital signs and the nursing notes.  Pertinent labs & imaging results that were available during my care of the patient were reviewed by me and considered in my medical decision making (see chart for details).     47 year old male presents today with rash to his hands.  High suspicion for dyshidrotic eczema versus allergic exposure.  No signs of secondary bacterial infection.  Due to the extensive involvement and location placement will be placed on steroids.  He has no history of diabetes.  Patient was hypertensive here, he is a symptom medic from this, he did not take his blood pressure medication, he will be given clonidine here.  Patient discharged with outpatient follow-up strict return precautions.  Patient verbalized understanding and agreement to today's plan had no further questions or concerns per  Final Clinical Impressions(s) / ED Diagnoses   Final diagnoses:    Dyshidrotic eczema    ED Discharge Orders         Ordered    predniSONE (DELTASONE) 20 MG tablet  Daily     11/02/18 1158           Okey Regal, PA-C 11/02/18 1200    Charlesetta Shanks, MD 11/03/18 [phone]

## 2018-11-02 NOTE — ED Triage Notes (Signed)
Pt did not take his BP medication today, reports it is normally high even when he takes it

## 2019-01-24 ENCOUNTER — Encounter (HOSPITAL_COMMUNITY): Payer: Self-pay

## 2019-01-24 ENCOUNTER — Emergency Department (HOSPITAL_COMMUNITY)
Admission: EM | Admit: 2019-01-24 | Discharge: 2019-01-24 | Disposition: A | Discharge status: Home / Self Care | Payer: Medicare Other | Attending: Emergency Medicine | Admitting: Emergency Medicine

## 2019-01-24 DIAGNOSIS — Z8673 Personal history of transient ischemic attack (TIA), and cerebral infarction without residual deficits: Secondary | ICD-10-CM | POA: Insufficient documentation

## 2019-01-24 DIAGNOSIS — Z79899 Other long term (current) drug therapy: Secondary | ICD-10-CM | POA: Diagnosis not present

## 2019-01-24 DIAGNOSIS — F17210 Nicotine dependence, cigarettes, uncomplicated: Secondary | ICD-10-CM | POA: Insufficient documentation

## 2019-01-24 DIAGNOSIS — J45909 Unspecified asthma, uncomplicated: Secondary | ICD-10-CM | POA: Insufficient documentation

## 2019-01-24 DIAGNOSIS — R569 Unspecified convulsions: Secondary | ICD-10-CM

## 2019-01-24 DIAGNOSIS — I10 Essential (primary) hypertension: Secondary | ICD-10-CM | POA: Diagnosis not present

## 2019-01-24 DIAGNOSIS — R4020 Unspecified coma: Secondary | ICD-10-CM | POA: Diagnosis not present

## 2019-01-24 LAB — CBG MONITORING, ED: GLUCOSE-CAPILLARY: 75 mg/dL (ref 70–99)

## 2019-01-24 NOTE — ED Notes (Signed)
PT refuse to be place on monitor
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
## 2019-01-24 NOTE — Discharge Instructions (Signed)
You were seen in the emergency department for a possible seizure.  You did not have any further seizure activity here.  Your blood pressure was elevated here.  Please take your seizure and blood pressure medicine as prescribed.  We are giving you the number for clinic to follow-up with.
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
## 2019-05-05 ENCOUNTER — Emergency Department (HOSPITAL_COMMUNITY)
Admission: EM | Admit: 2019-05-05 | Discharge: 2019-05-05 | Disposition: A | Discharge status: Home / Self Care | Payer: Medicare Other | Attending: Emergency Medicine | Admitting: Emergency Medicine

## 2019-05-05 ENCOUNTER — Other Ambulatory Visit: Payer: Self-pay

## 2019-05-05 ENCOUNTER — Emergency Department (HOSPITAL_COMMUNITY): Payer: Medicare Other

## 2019-05-05 DIAGNOSIS — Z7982 Long term (current) use of aspirin: Secondary | ICD-10-CM | POA: Insufficient documentation

## 2019-05-05 DIAGNOSIS — F17210 Nicotine dependence, cigarettes, uncomplicated: Secondary | ICD-10-CM | POA: Diagnosis not present

## 2019-05-05 DIAGNOSIS — I160 Hypertensive urgency: Secondary | ICD-10-CM | POA: Diagnosis not present

## 2019-05-05 DIAGNOSIS — Z79899 Other long term (current) drug therapy: Secondary | ICD-10-CM | POA: Diagnosis not present

## 2019-05-05 DIAGNOSIS — R51 Headache: Secondary | ICD-10-CM | POA: Insufficient documentation

## 2019-05-05 DIAGNOSIS — G40909 Epilepsy, unspecified, not intractable, without status epilepticus: Secondary | ICD-10-CM | POA: Diagnosis not present

## 2019-05-05 DIAGNOSIS — J45909 Unspecified asthma, uncomplicated: Secondary | ICD-10-CM | POA: Insufficient documentation

## 2019-05-05 DIAGNOSIS — R519 Headache, unspecified: Secondary | ICD-10-CM

## 2019-05-05 DIAGNOSIS — R0602 Shortness of breath: Secondary | ICD-10-CM | POA: Diagnosis not present

## 2019-05-05 LAB — CBC WITH DIFFERENTIAL/PLATELET
Abs Immature Granulocytes: 0 10*3/uL (ref 0.00–0.07)
Basophils Absolute: 0 10*3/uL (ref 0.0–0.1)
Basophils Relative: 0 %
Eosinophils Absolute: 0 10*3/uL (ref 0.0–0.5)
Eosinophils Relative: 0 %
HCT: 51.4 % (ref 39.0–52.0)
Hemoglobin: 17.1 g/dL — ABNORMAL HIGH (ref 13.0–17.0)
Lymphocytes Relative: 19 %
Lymphs Abs: 1.3 10*3/uL (ref 0.7–4.0)
MCH: 32.4 pg (ref 26.0–34.0)
MCHC: 33.3 g/dL (ref 30.0–36.0)
MCV: 97.3 fL (ref 80.0–100.0)
Monocytes Absolute: 0.5 10*3/uL (ref 0.1–1.0)
Monocytes Relative: 7 %
Neutro Abs: 5.1 10*3/uL (ref 1.7–7.7)
Neutrophils Relative %: 74 %
Platelets: 232 10*3/uL (ref 150–400)
RBC: 5.28 MIL/uL (ref 4.22–5.81)
RDW: 13.9 % (ref 11.5–15.5)
WBC: 6.9 10*3/uL (ref 4.0–10.5)
nRBC: 0 % (ref 0.0–0.2)
nRBC: 0 /100 WBC

## 2019-05-05 LAB — COMPREHENSIVE METABOLIC PANEL
ALT: 13 U/L (ref 0–44)
AST: 16 U/L (ref 15–41)
Albumin: 3.7 g/dL (ref 3.5–5.0)
Alkaline Phosphatase: 64 U/L (ref 38–126)
Anion gap: 11 (ref 5–15)
BUN: 7 mg/dL (ref 6–20)
CO2: 23 mmol/L (ref 22–32)
Calcium: 9.3 mg/dL (ref 8.9–10.3)
Chloride: 105 mmol/L (ref 98–111)
Creatinine, Ser: 1.01 mg/dL (ref 0.61–1.24)
GFR calc Af Amer: >60 mL/min (ref >60)
GFR calc non Af Amer: >60 mL/min (ref >60)
Glucose, Bld: 103 mg/dL — ABNORMAL HIGH (ref 70–99)
Potassium: 4 mmol/L (ref 3.5–5.1)
Sodium: 139 mmol/L (ref 135–145)
Total Bilirubin: 0.7 mg/dL (ref 0.3–1.2)
Total Protein: 6.8 g/dL (ref 6.5–8.1)

## 2019-05-05 LAB — BRAIN NATRIURETIC PEPTIDE: B Natriuretic Peptide: 26.6 pg/mL (ref 0.0–100.0)

## 2019-05-05 MED ORDER — HYDRALAZINE HCL 20 MG/ML IJ SOLN
10.0000 mg | INTRAMUSCULAR | Status: AC
  Administered 2019-05-05: 14:00:00 10 mg via INTRAVENOUS
  Filled 2019-05-05: qty 1

## 2019-05-05 MED ORDER — LOSARTAN POTASSIUM 25 MG PO TABS: 25.0000 mg | ORAL_TABLET | Freq: Every day | ORAL | 0 refills | Status: DC

## 2019-05-05 NOTE — Discharge Instructions (Signed)
As discussed, in addition to your current blood pressure medication is very important that you take the newly prescribed medication. Please monitor your condition carefully, and return here for any concerning changes per Otherwise, please be sure to follow-up with your physician and discuss both today's presentation and appropriate ongoing medication for your blood pressure.

## 2019-05-05 NOTE — ED Provider Notes (Signed)
[facility] EMERGENCY DEPARTMENT Provider Note   CSN: [id number] Arrival date & time: 05/05/19  1222    History   Chief Complaint Chief Complaint  Patient presents with   Shortness of Breath    HPI Richard [name] is a 48 y.o. male.     HPI Patient presents concern of headache and dyspnea. Patient acknowledges history of hypertension, seizures, and has had headaches in the past. Typically, when he has a headache, he called EMS, and after being provided oxygen they improve. He notes that this incident began about 4 days ago, and since that time he has had diffuse head soreness, and today decided to seek assistance. Patient did not call EMS, but drove himself to the hospital. He notes that he typically has some dyspnea, this is unchanged substantially. No vision changes, no weakness in his extremities Patient acknowledges that he missed 1 dose of his antihypertensive medication regimen yesterday, states that he is otherwise been compliant with all medication.  Past Medical History:  Diagnosis Date   Asthma    Brain bleed ([place])    Hypertension    Seizures ([place])    last episode 03/2013    Patient Active Problem List   Diagnosis Date Noted   Swollen testicle    Atelectasis    Syncope 08/26/2015   Acute encephalopathy 08/26/2015   Seizures ([place])    Acute pulmonary edema ([place])    Lower leg DVT (deep venous thromboembolism), acute ([place])    Protein-calorie malnutrition, severe ([place]) 02/19/2015   Bacteremia due to Staphylococcus 02/17/2015   Alcohol withdrawal delirium ([place])    Acute respiratory failure with hypoxia ([place]) 02/13/2015   Acute respiratory failure with hypoxemia (HCC)    Altered mental status    Metabolic acidosis    Essential hypertension    Encephalopathy 02/11/2015   Alcohol intoxication ([place]) 02/11/2015   Lactic acidosis 02/11/2015   Bleeding from the nose 03/19/2014   TIA (transient ischemic attack) 12/24/2013     Weakness 12/23/2013   Left-sided weakness 12/23/2013   Malignant hypertension 12/23/2013   Seizure disorder ([place]) 12/23/2013   Vomiting 06/18/2013   Chest pain 06/18/2013   Hypertensive urgency 10/30/2012   Migraine variant 10/30/2012   TOBACCO ABUSE 01/11/2008   HYPERTENSION, BENIGN 11/24/2007   CHRONIC FRONTAL SINUSITIS 11/24/2007   Convulsions ([place]) 11/24/2007   Headache(784.0) 11/24/2007    Past Surgical History:  Procedure Laterality Date   LEG SURGERY          Home Medications    Prior to Admission medications   Medication Sig Start Date End Date Taking? Authorizing Provider  Aspirin-Acetaminophen-Caffeine (GOODYS EXTRA STRENGTH) [phone] MG PACK Take 2 packets by mouth daily as needed (pain).    [provider]  cloNIDine (CATAPRES) 0.3 MG tablet Take 1 tablet (0.3 mg total) by mouth 3 (three) times daily. 12/28/17   Hedges, Dellis Filbert, PA-C  losartan (COZAAR) 25 MG tablet Take 1 tablet (25 mg total) by mouth daily. 05/05/19   Carmin Muskrat, MD  phenytoin (DILANTIN) 50 MG tablet Chew 50 mg by mouth 2 (two) times daily.    [provider]  predniSONE (DELTASONE) 20 MG tablet Take 2 tablets (40 mg total) by mouth daily. 11/02/18   Okey Regal, PA-C    Family History Family History  Problem Relation Age of Onset   Cancer Father    Diabetes Mellitus II Sister     Social History Social History   Tobacco Use   Smoking status: Current Every Day  Smoker    Packs/day: 1.00    Years: 26.00    Pack years: 26.00    Types: Cigarettes   Smokeless tobacco: Never Used  Substance Use Topics   Alcohol use: Yes   Drug use: Yes    Types: Cocaine    Comment: last use several years ago      Allergies   Dilaudid [hydromorphone hcl]   Review of Systems Review of Systems  Constitutional:       Per HPI, otherwise negative  HENT:       Per HPI, otherwise negative  Respiratory:       Per HPI, otherwise negative   Cardiovascular:       Per HPI, otherwise negative  Gastrointestinal: Negative for vomiting.  Endocrine:       Negative aside from HPI  Genitourinary:       Neg aside from HPI   Musculoskeletal:       Per HPI, otherwise negative  Skin: Negative.   Neurological: Positive for headaches. Negative for syncope.     Physical Exam Updated Vital Signs BP (!) 185/129    Pulse 60    Temp 97.7 F (36.5 C) (Oral)    Resp 19    SpO2 95%   Physical Exam Vitals signs and nursing note reviewed.  Constitutional:      General: He is not in acute distress.    Appearance: He is well-developed.  HENT:     Head: Normocephalic and atraumatic.  Eyes:     Conjunctiva/sclera: Conjunctivae normal.  Cardiovascular:     Rate and Rhythm: Normal rate and regular rhythm.  Pulmonary:     Effort: Pulmonary effort is normal. No respiratory distress.     Breath sounds: No stridor.  Abdominal:     General: There is no distension.  Skin:    General: Skin is warm and dry.  Neurological:     Mental Status: He is alert and oriented to person, place, and time.      ED Treatments / Results  Labs (all labs ordered are listed, but only abnormal results are displayed) Labs Reviewed  COMPREHENSIVE METABOLIC PANEL - Abnormal; Notable for the following components:      Result Value   Glucose, Bld 103 (*)    All other components within normal limits  CBC WITH DIFFERENTIAL/PLATELET - Abnormal; Notable for the following components:   Hemoglobin 17.1 (*)    All other components within normal limits  BRAIN NATRIURETIC PEPTIDE    EKG EKG Interpretation  Date/Time:  Friday May 05 2019 12:48:47 EDT Ventricular Rate:  61 PR Interval:  152 QRS Duration: 98 QT Interval:  426 QTC Calculation: 428 R Axis:   68 Text Interpretation:  Normal sinus rhythm Left ventricular hypertrophy Early repolarization pattern No significant change since last tracing Abnormal ECG Confirmed by Carmin Muskrat [phone]) on 05/05/2019  1:13:40 PM   Radiology Dg Chest 2 View  Result Date: 05/05/2019 CLINICAL DATA:  Shortness of breath. EXAM: CHEST - 2 VIEW COMPARISON:  Radiographs of January 02, 2017. FINDINGS: The heart size and mediastinal contours are within normal limits. Both lungs are clear. No pneumothorax or pleural effusion is noted. The visualized skeletal structures are unremarkable. IMPRESSION: No active cardiopulmonary disease. Electronically Signed   By: Marijo Conception M.D.   On: 05/05/2019 13:45    Procedures Procedures (including critical care time)  Medications Ordered in ED Medications  hydrALAZINE (APRESOLINE) injection 10 mg (10 mg Intravenous Given 05/05/19 1421)  Initial Impression / Assessment and Plan / ED Course  I have reviewed the triage vital signs and the nursing notes.  Pertinent labs & imaging results that were available during my care of the patient were reviewed by me and considered in my medical decision making (see chart for details).    Initial blood pressure notable for diastolic greater than 088. Per report the patient takes clonidine for blood pressure control, he will receive hydralazine here, require reassessment.    2:57 PM Patient sitting upright, awake, alert.  He states that he is ready to go home. Labs generally reassuring, no evidence for kidney injury, in no distress. There is persistent demonstration of hypertrophy on his EKG, no evidence for acute ischemia. He has no ongoing complaints, including dyspnea or headache. We discussed the importance of an additional medication for his blood pressure regimen, and follow-up with primary care, and return precautions. Patient discharged in stable condition.  Final Clinical Impressions(s) / ED Diagnoses   Final diagnoses:  Bad headache  Hypertensive urgency    ED Discharge Orders         Ordered    losartan (COZAAR) 25 MG tablet  Daily     05/05/19 1456           Carmin Muskrat, MD 05/05/19 1457

## 2019-05-05 NOTE — ED Notes (Signed)
Nurse navigator spoke with patient assisted to bathroom and stated spoke with wife already.

## 2019-05-05 NOTE — ED Notes (Signed)
Patient transported to X-ray 

## 2019-05-05 NOTE — ED Triage Notes (Signed)
Pt in with c/o sob since yesterday. States EMS came out to his and told him his O2 was low, applied on 2L and it got better. Arrives with sob, sats 97%, denies any cough/fever or cp.

## 2019-06-07 ENCOUNTER — Other Ambulatory Visit: Payer: Self-pay

## 2019-11-03 ENCOUNTER — Other Ambulatory Visit: Payer: Self-pay

## 2019-11-03 ENCOUNTER — Emergency Department (HOSPITAL_COMMUNITY)
Admission: EM | Admit: 2019-11-03 | Discharge: 2019-11-04 | Disposition: A | Discharge status: Home / Self Care | Payer: Medicare Other | Attending: Emergency Medicine | Admitting: Emergency Medicine

## 2019-11-03 ENCOUNTER — Encounter (HOSPITAL_COMMUNITY): Payer: Self-pay | Admitting: Emergency Medicine

## 2019-11-03 DIAGNOSIS — R040 Epistaxis: Secondary | ICD-10-CM | POA: Insufficient documentation

## 2019-11-03 DIAGNOSIS — Z743 Need for continuous supervision: Secondary | ICD-10-CM | POA: Diagnosis not present

## 2019-11-03 DIAGNOSIS — R05 Cough: Secondary | ICD-10-CM | POA: Insufficient documentation

## 2019-11-03 DIAGNOSIS — J45909 Unspecified asthma, uncomplicated: Secondary | ICD-10-CM | POA: Insufficient documentation

## 2019-11-03 DIAGNOSIS — I10 Essential (primary) hypertension: Secondary | ICD-10-CM | POA: Insufficient documentation

## 2019-11-03 DIAGNOSIS — R58 Hemorrhage, not elsewhere classified: Secondary | ICD-10-CM | POA: Diagnosis not present

## 2019-11-03 DIAGNOSIS — F17210 Nicotine dependence, cigarettes, uncomplicated: Secondary | ICD-10-CM | POA: Diagnosis not present

## 2019-11-03 MED ORDER — OXYMETAZOLINE HCL 0.05 % NA SOLN
1.0000 | Freq: Once | NASAL | Status: DC
  Filled 2019-11-03: qty 30

## 2019-11-03 NOTE — ED Triage Notes (Signed)
Pt. Stated that his nose started to blees at 0800. Pt. Reports intermittent bleeding of nose off and on all day. Pt. denies headache or pain.Marland Kitchen

## 2019-11-04 DIAGNOSIS — R040 Epistaxis: Secondary | ICD-10-CM | POA: Diagnosis not present

## 2019-11-04 MED ORDER — OXYMETAZOLINE HCL 0.05 % NA SOLN
2.0000 | Freq: Once | NASAL | Status: AC
  Administered 2019-11-04: 2 via NASAL

## 2019-11-04 NOTE — ED Provider Notes (Signed)
Emergency Department Provider Note   I have reviewed the triage vital signs and the nursing notes.   HISTORY  Chief Complaint Epistaxis   HPI Richard [name] is a 48 y.o. male who presents for intermittent nosebleeds that the day today.  Sometimes will bleed for up to 4 to 5 minutes.  Always stops.  Is not bleeding at this time.  No lightheadedness or feel like he is going to pass out.  States he has been coughing some recently.  States of blowing his nose seems to make it worse.  No foreign bodies that he knows of no trauma that he knows of.  When I note that his oxygen was low bit low he denies fevers or productive cough.   No other associated or modifying symptoms.    Past Medical History:  Diagnosis Date  . Asthma   . Brain bleed ([place])   . Hypertension   . Seizures ([place])    last episode 03/2013    Patient Active Problem List   Diagnosis Date Noted  . Swollen testicle   . Atelectasis   . Syncope 08/26/2015  . Acute encephalopathy 08/26/2015  . Seizures ([place])   . Acute pulmonary edema (HCC)   . Lower leg DVT (deep venous thromboembolism), acute ([place])   . Protein-calorie malnutrition, severe ([place]) 02/19/2015  . Bacteremia due to Staphylococcus 02/17/2015  . Alcohol withdrawal delirium ([place])   . Acute respiratory failure with hypoxia ([place]) 02/13/2015  . Acute respiratory failure with hypoxemia ([place])   . Altered mental status   . Metabolic acidosis   . Essential hypertension   . Encephalopathy 02/11/2015  . Alcohol intoxication ([place]) 02/11/2015  . Lactic acidosis 02/11/2015  . Bleeding from the nose 03/19/2014  . TIA (transient ischemic attack) 12/24/2013  . Weakness 12/23/2013  . Left-sided weakness 12/23/2013  . Malignant hypertension 12/23/2013  . Seizure disorder ([place]) 12/23/2013  . Vomiting 06/18/2013  . Chest pain 06/18/2013  . Hypertensive urgency 10/30/2012  . Migraine variant 10/30/2012  . TOBACCO ABUSE 01/11/2008  . HYPERTENSION, BENIGN 11/24/2007   . CHRONIC FRONTAL SINUSITIS 11/24/2007  . Convulsions ([place]) 11/24/2007  . Headache(784.0) 11/24/2007    Past Surgical History:  Procedure Laterality Date  . LEG SURGERY      Current Outpatient Rx  . Order #: [id number] Class: Historical Med  . Order #: [id number] Class: Print  . Order #: [id number] Class: Normal  . Order #: [id number] Class: Historical Med  . Order #: [id number] Class: Print    Allergies Dilaudid [hydromorphone hcl]  Family History  Problem Relation Age of Onset  . Cancer Father   . Diabetes Mellitus II Sister     Social History Social History   Tobacco Use  . Smoking status: Current Every Day Smoker    Packs/day: 1.00    Years: 26.00    Pack years: 26.00    Types: Cigarettes  . Smokeless tobacco: Never Used  Substance Use Topics  . Alcohol use: Yes  . Drug use: Not Currently    Types: Cocaine    Comment: last use several years ago     Review of Systems  All other systems negative except as documented in the HPI. All pertinent positives and negatives as reviewed in the HPI. ____________________________________________   PHYSICAL EXAM:  VITAL SIGNS: ED Triage Vitals  Enc Vitals Group     BP 11/03/19 2315 (!) 144/113     Pulse Rate 11/03/19 2315 85     Resp 11/03/19 2315 (!) 21  Temp 11/03/19 2315 98.1 F (36.7 C)     Temp Source 11/03/19 2315 Oral     SpO2 11/03/19 2315 93 %     Weight 11/03/19 2320 225 lb (102.1 kg)     Height 11/03/19 2320 5\' 9"  (1.753 m)    Constitutional: Alert and oriented. Well appearing and in no acute distress. Eyes: Conjunctivae are normal. PERRL. EOMI. Head: Atraumatic. Nose: No congestion/rhinnorhea. Sequelae of previous anterior bleed in left nare Mouth/Throat: Mucous membranes are moist.  Oropharynx non-erythematous. Neck: No stridor.  No meningeal signs.   Cardiovascular: Normal rate, regular rhythm. Good peripheral circulation. Grossly normal heart sounds.   Respiratory: Normal respiratory effort.  No  retractions. Lungs with mild wheezing.. Gastrointestinal: Soft and nontender. No distention.  Musculoskeletal: No lower extremity tenderness nor edema. No gross deformities of extremities. Neurologic:  Normal speech and language. No gross focal neurologic deficits are appreciated.  Skin:  Skin is warm, dry and intact. No rash noted.  ____________________________________________   INITIAL IMPRESSION / ASSESSMENT AND PLAN / ED COURSE  Patient with nosebleed that has stopped.  Will instruct in the use of Afrin.  Appears to be anterior.  No evidence of acute blood loss anemia.  Mild wheezing likely baseline vital signs.  Patient is asymptomatic from that standpoint.  Pertinent labs & imaging results that were available during my care of the patient were reviewed by me and considered in my medical decision making (see chart for details).  A medical screening exam was performed and I feel the patient has had an appropriate workup for their chief complaint at this time and likelihood of emergent condition existing is low. They have been counseled on decision, discharge, follow up and which symptoms necessitate immediate return to the emergency department. They or their family verbally stated understanding and agreement with plan and discharged in stable condition.   ____________________________________________  FINAL CLINICAL IMPRESSION(S) / ED DIAGNOSES  Final diagnoses:  Epistaxis     MEDICATIONS GIVEN DURING THIS VISIT:  Medications  oxymetazoline (AFRIN) 0.05 % nasal spray 1 spray (has no administration in time range)  oxymetazoline (AFRIN) 0.05 % nasal spray 2 spray (has no administration in time range)     NEW OUTPATIENT MEDICATIONS STARTED DURING THIS VISIT:  New Prescriptions   No medications on file    Note:  This note was prepared with assistance of Dragon voice recognition software. Occasional wrong-word or sound-a-like substitutions may have occurred due to the inherent  limitations of voice recognition software.   Maleiah Dula, Corene Cornea, MD 11/04/19 [phone]

## 2019-11-05 ENCOUNTER — Encounter (HOSPITAL_COMMUNITY): Payer: Self-pay | Admitting: *Deleted

## 2019-11-05 ENCOUNTER — Emergency Department (HOSPITAL_COMMUNITY)
Admission: EM | Admit: 2019-11-05 | Discharge: 2019-11-06 | Disposition: A | Discharge status: Home / Self Care | Payer: Medicare Other | Attending: Emergency Medicine | Admitting: Emergency Medicine

## 2019-11-05 ENCOUNTER — Other Ambulatory Visit: Payer: Self-pay

## 2019-11-05 DIAGNOSIS — I10 Essential (primary) hypertension: Secondary | ICD-10-CM | POA: Insufficient documentation

## 2019-11-05 DIAGNOSIS — F17210 Nicotine dependence, cigarettes, uncomplicated: Secondary | ICD-10-CM | POA: Insufficient documentation

## 2019-11-05 DIAGNOSIS — Z743 Need for continuous supervision: Secondary | ICD-10-CM | POA: Diagnosis not present

## 2019-11-05 DIAGNOSIS — Z79899 Other long term (current) drug therapy: Secondary | ICD-10-CM | POA: Insufficient documentation

## 2019-11-05 DIAGNOSIS — R040 Epistaxis: Secondary | ICD-10-CM | POA: Diagnosis not present

## 2019-11-05 DIAGNOSIS — R0902 Hypoxemia: Secondary | ICD-10-CM | POA: Diagnosis not present

## 2019-11-05 DIAGNOSIS — R58 Hemorrhage, not elsewhere classified: Secondary | ICD-10-CM | POA: Diagnosis not present

## 2019-11-05 DIAGNOSIS — J45909 Unspecified asthma, uncomplicated: Secondary | ICD-10-CM | POA: Diagnosis not present

## 2019-11-05 LAB — CBC WITH DIFFERENTIAL/PLATELET
Abs Immature Granulocytes: 0.03 10*3/uL (ref 0.00–0.07)
Basophils Absolute: 0 10*3/uL (ref 0.0–0.1)
Basophils Relative: 1 %
Eosinophils Absolute: 0 10*3/uL (ref 0.0–0.5)
Eosinophils Relative: 0 %
HCT: 45.5 % (ref 39.0–52.0)
Hemoglobin: 15.3 g/dL (ref 13.0–17.0)
Immature Granulocytes: 0 %
Lymphocytes Relative: 29 %
Lymphs Abs: 2.4 10*3/uL (ref 0.7–4.0)
MCH: 32.3 pg (ref 26.0–34.0)
MCHC: 33.6 g/dL (ref 30.0–36.0)
MCV: 96.2 fL (ref 80.0–100.0)
Monocytes Absolute: 1 10*3/uL (ref 0.1–1.0)
Monocytes Relative: 12 %
Neutro Abs: 4.9 10*3/uL (ref 1.7–7.7)
Neutrophils Relative %: 58 %
Platelets: 240 10*3/uL (ref 150–400)
RBC: 4.73 MIL/uL (ref 4.22–5.81)
RDW: 13.8 % (ref 11.5–15.5)
WBC: 8.4 10*3/uL (ref 4.0–10.5)
nRBC: 0 % (ref 0.0–0.2)

## 2019-11-05 LAB — COMPREHENSIVE METABOLIC PANEL
ALT: 13 U/L (ref 0–44)
AST: 15 U/L (ref 15–41)
Albumin: 3.4 g/dL — ABNORMAL LOW (ref 3.5–5.0)
Alkaline Phosphatase: 55 U/L (ref 38–126)
Anion gap: 5 (ref 5–15)
BUN: 15 mg/dL (ref 6–20)
CO2: 25 mmol/L (ref 22–32)
Calcium: 8.9 mg/dL (ref 8.9–10.3)
Chloride: 108 mmol/L (ref 98–111)
Creatinine, Ser: 0.93 mg/dL (ref 0.61–1.24)
GFR calc Af Amer: >60 mL/min (ref >60)
GFR calc non Af Amer: >60 mL/min (ref >60)
Glucose, Bld: 95 mg/dL (ref 70–99)
Potassium: 4.1 mmol/L (ref 3.5–5.1)
Sodium: 138 mmol/L (ref 135–145)
Total Bilirubin: 0.7 mg/dL (ref 0.3–1.2)
Total Protein: 6.4 g/dL — ABNORMAL LOW (ref 6.5–8.1)

## 2019-11-05 LAB — PROTIME-INR
INR: 0.9 (ref 0.8–1.2)
Prothrombin Time: 12.5 seconds (ref 11.4–15.2)

## 2019-11-05 MED ORDER — TRANEXAMIC ACID 1000 MG/10ML IV SOLN
500.0000 mg | Freq: Once | INTRAVENOUS | Status: DC
  Filled 2019-11-05: qty 10

## 2019-11-05 MED ORDER — OXYMETAZOLINE HCL 0.05 % NA SOLN
1.0000 | Freq: Once | NASAL | Status: AC
  Administered 2019-11-05: 1 via NASAL
  Filled 2019-11-05: qty 30

## 2019-11-05 MED ORDER — LOSARTAN POTASSIUM 50 MG PO TABS
25.0000 mg | ORAL_TABLET | Freq: Once | ORAL | Status: AC
  Administered 2019-11-05: 25 mg via ORAL
  Filled 2019-11-05: qty 1

## 2019-11-05 MED ORDER — SALINE SPRAY 0.65 % NA SOLN
1.0000 | Freq: Once | NASAL | Status: DC
  Filled 2019-11-05: qty 44

## 2019-11-05 NOTE — ED Provider Notes (Addendum)
[facility] EMERGENCY DEPARTMENT Provider Note   CSN: [id number] Arrival date & time: 11/05/19  1706     History   Chief Complaint Chief Complaint  Patient presents with  . Epistaxis    HPI Rykar Escoe is a 48 y.o. male.     HPI  48 year old male comes in a chief complaint of epistaxis. He has history of seizure, smoking and prior heavy alcohol use. He reports that his been having bloody nose for the last few days.  The epistaxis is intermittent and unprovoked.  He denies any trauma.  He denies any cocaine use.  No recent URI-like symptoms.  He was seen in the ER last night and sent home with Afrin.  Patient states that he has used the Afrin without significant relief.  Past Medical History:  Diagnosis Date  . Asthma   . Brain bleed ([place])   . Hypertension   . Seizures ([place])    last episode 03/2013    Patient Active Problem List   Diagnosis Date Noted  . Swollen testicle   . Atelectasis   . Syncope 08/26/2015  . Acute encephalopathy 08/26/2015  . Seizures ([place])   . Acute pulmonary edema (HCC)   . Lower leg DVT (deep venous thromboembolism), acute ([place])   . Protein-calorie malnutrition, severe ([place]) 02/19/2015  . Bacteremia due to Staphylococcus 02/17/2015  . Alcohol withdrawal delirium ([place])   . Acute respiratory failure with hypoxia ([place]) 02/13/2015  . Acute respiratory failure with hypoxemia ([place])   . Altered mental status   . Metabolic acidosis   . Essential hypertension   . Encephalopathy 02/11/2015  . Alcohol intoxication ([place]) 02/11/2015  . Lactic acidosis 02/11/2015  . Bleeding from the nose 03/19/2014  . TIA (transient ischemic attack) 12/24/2013  . Weakness 12/23/2013  . Left-sided weakness 12/23/2013  . Malignant hypertension 12/23/2013  . Seizure disorder ([place]) 12/23/2013  . Vomiting 06/18/2013  . Chest pain 06/18/2013  . Hypertensive urgency 10/30/2012  . Migraine variant 10/30/2012  . TOBACCO ABUSE 01/11/2008  .  HYPERTENSION, BENIGN 11/24/2007  . CHRONIC FRONTAL SINUSITIS 11/24/2007  . Convulsions ([place]) 11/24/2007  . Headache(784.0) 11/24/2007    Past Surgical History:  Procedure Laterality Date  . LEG SURGERY          Home Medications    Prior to Admission medications   Medication Sig Start Date End Date Taking? Authorizing Provider  Aspirin-Acetaminophen-Caffeine (GOODYS EXTRA STRENGTH) [phone] MG PACK Take 2 packets by mouth daily as needed (pain).    [provider]  cloNIDine (CATAPRES) 0.3 MG tablet Take 1 tablet (0.3 mg total) by mouth 3 (three) times daily. 12/28/17   Hedges, Dellis Filbert, PA-C  losartan (COZAAR) 25 MG tablet Take 1 tablet (25 mg total) by mouth daily. 05/05/19   Carmin Muskrat, MD  losartan (COZAAR) 25 MG tablet Take 1 tablet (25 mg total) by mouth daily. 11/06/19   Varney Biles, MD  phenytoin (DILANTIN) 50 MG tablet Chew 50 mg by mouth 2 (two) times daily.    [provider]  predniSONE (DELTASONE) 20 MG tablet Take 2 tablets (40 mg total) by mouth daily. 11/02/18   Okey Regal, PA-C    Family History Family History  Problem Relation Age of Onset  . Cancer Father   . Diabetes Mellitus II Sister     Social History Social History   Tobacco Use  . Smoking status: Current Every Day Smoker    Packs/day: 1.00    Years: 26.00  Pack years: 26.00    Types: Cigarettes  . Smokeless tobacco: Never Used  Substance Use Topics  . Alcohol use: Yes  . Drug use: Not Currently    Types: Cocaine    Comment: last use several years ago      Allergies   Dilaudid [hydromorphone hcl]   Review of Systems Review of Systems  Constitutional: Positive for activity change.  HENT: Negative for congestion.   Allergic/Immunologic: Negative for immunocompromised state.  Hematological: Does not bruise/bleed easily.  All other systems reviewed and are negative.    Physical Exam Updated Vital Signs BP (!) 153/92   Pulse 80   Temp 98.4 F  (36.9 C) (Oral)   Resp 18   Ht 5\' 9"  (1.753 m)   Wt 102 kg   SpO2 (!) 88%   BMI 33.21 kg/m   Physical Exam Vitals signs and nursing note reviewed.  Constitutional:      Appearance: He is well-developed.  HENT:     Head: Atraumatic.     Nose:     Comments: Bleeding from left nare Neck:     Musculoskeletal: Neck supple.  Cardiovascular:     Rate and Rhythm: Normal rate.  Pulmonary:     Effort: Pulmonary effort is normal.  Skin:    General: Skin is warm.  Neurological:     Mental Status: He is alert and oriented to person, place, and time.      ED Treatments / Results  Labs (all labs ordered are listed, but only abnormal results are displayed) Labs Reviewed  COMPREHENSIVE METABOLIC PANEL - Abnormal; Notable for the following components:      Result Value   Total Protein 6.4 (*)    Albumin 3.4 (*)    All other components within normal limits  CBC WITH DIFFERENTIAL/PLATELET  PROTIME-INR    EKG None  Radiology No results found.  Procedures Procedures (including critical care time)  Medications Ordered in ED Medications  sodium chloride (OCEAN) 0.65 % nasal spray 1 spray (has no administration in time range)  tranexamic acid (CYKLOKAPRON) injection 500 mg (has no administration in time range)  losartan (COZAAR) tablet 25 mg (25 mg Oral Given 11/05/19 2156)  oxymetazoline (AFRIN) 0.05 % nasal spray 1 spray (1 spray Left Nare Given 11/05/19 2341)     Initial Impression / Assessment and Plan / ED Course  I have reviewed the triage vital signs and the nursing notes.  Pertinent labs & imaging results that were available during my care of the patient were reviewed by me and considered in my medical decision making (see chart for details).  Clinical Course as of Nov 05 21  Mon Nov 06, 2019  0022 Labs show no thrombocytopenia.  INR is normal.  Bleeding controlled with Afrin spray and TXA.   [AN]    Clinical Course User Index [AN] Varney Biles, MD        Patient comes in a chief complaint of bleeding from left nare. Epistaxis seems to be persistent but not severe. We ordered [place] spray and reassess the patient is still bleeding. Afrin will be applied, if that fails then we will apply TXA. Patient does not want packing if possible.  Final Clinical Impressions(s) / ED Diagnoses   Final diagnoses:  Epistaxis    ED Discharge Orders         Ordered    losartan (COZAAR) 25 MG tablet  Daily     11/06/19 0021  Varney Biles, MD 11/05/19 3112    Varney Biles, MD 11/06/19 1624

## 2019-11-05 NOTE — ED Triage Notes (Signed)
The pt arrived by [facility] with a nosebleed since Friday  He has been out of bp med for several months  Supposed to be taking blood thinners for tia symptoms  But he has not had them for awhile either

## 2019-11-06 MED ORDER — LOSARTAN POTASSIUM 25 MG PO TABS: 25.0000 mg | ORAL_TABLET | Freq: Every day | ORAL | 1 refills | Status: DC

## 2019-11-06 NOTE — Discharge Instructions (Addendum)
You had a nose bleed which stopped spontaneously. °Nose bleeds can recur however, so please read the instructions below. ° °If the bleeding recurs, please apply direct pressure to the nose for 5 minutes straight, breathing from the mouth and spitting out any blood. °After 5 minutes of holding pressure, if the bleeding continues, do the same thing again for 5 more minutes. °If after 2nd round of holding pressure the bleeding persists - clear the nose and apply afrin spray. After applying afrin spray to the nares, hold pressure again for 5 minutes. °If the bleeding continues despite these measures, then come to the ER while holding direct pressure to the nares. ° °For now - please do not blow your nose, or put fingers in your nose to clear up any clots. ° °

## 2020-01-24 ENCOUNTER — Other Ambulatory Visit: Payer: Self-pay

## 2020-01-24 NOTE — Patient Outreach (Signed)
[facility]) Care Management  01/24/2020  Taiwo Fish 19-Aug-1971 [id number]   Medication Adherence call to [name] not sure if this is correct patients telephone number left a message for patient to call back. Patient is showing past due on Losartan 25 mg under [place].  [place] Management Direct Dial [phone]  Fax [phone] [email or web address]

## 2020-02-01 DIAGNOSIS — R52 Pain, unspecified: Secondary | ICD-10-CM | POA: Diagnosis not present

## 2020-02-01 DIAGNOSIS — G4489 Other headache syndrome: Secondary | ICD-10-CM | POA: Diagnosis not present

## 2020-02-01 DIAGNOSIS — E161 Other hypoglycemia: Secondary | ICD-10-CM | POA: Diagnosis not present

## 2020-02-01 DIAGNOSIS — G4089 Other seizures: Secondary | ICD-10-CM | POA: Diagnosis not present

## 2020-02-01 DIAGNOSIS — E162 Hypoglycemia, unspecified: Secondary | ICD-10-CM | POA: Diagnosis not present

## 2020-02-18 DIAGNOSIS — E669 Obesity, unspecified: Secondary | ICD-10-CM | POA: Diagnosis not present

## 2020-02-18 DIAGNOSIS — Z6833 Body mass index (BMI) 33.0-33.9, adult: Secondary | ICD-10-CM | POA: Diagnosis not present

## 2020-02-18 DIAGNOSIS — F17210 Nicotine dependence, cigarettes, uncomplicated: Secondary | ICD-10-CM | POA: Diagnosis not present

## 2020-02-18 DIAGNOSIS — I10 Essential (primary) hypertension: Secondary | ICD-10-CM | POA: Diagnosis not present

## 2020-03-03 ENCOUNTER — Encounter (HOSPITAL_COMMUNITY): Payer: Self-pay

## 2020-03-03 ENCOUNTER — Other Ambulatory Visit: Payer: Self-pay

## 2020-03-03 ENCOUNTER — Emergency Department (HOSPITAL_COMMUNITY): Payer: Medicare HMO

## 2020-03-03 ENCOUNTER — Observation Stay (HOSPITAL_COMMUNITY)
Admission: EM | Admit: 2020-03-03 | Discharge: 2020-03-04 | Disposition: A | Discharge status: Home / Self Care | Payer: Medicare HMO | Attending: Family Medicine | Admitting: Family Medicine

## 2020-03-03 DIAGNOSIS — S6992XA Unspecified injury of left wrist, hand and finger(s), initial encounter: Secondary | ICD-10-CM | POA: Diagnosis not present

## 2020-03-03 DIAGNOSIS — R569 Unspecified convulsions: Secondary | ICD-10-CM

## 2020-03-03 DIAGNOSIS — Z20822 Contact with and (suspected) exposure to covid-19: Secondary | ICD-10-CM | POA: Insufficient documentation

## 2020-03-03 DIAGNOSIS — J45909 Unspecified asthma, uncomplicated: Secondary | ICD-10-CM | POA: Diagnosis not present

## 2020-03-03 DIAGNOSIS — J9601 Acute respiratory failure with hypoxia: Principal | ICD-10-CM | POA: Insufficient documentation

## 2020-03-03 DIAGNOSIS — R918 Other nonspecific abnormal finding of lung field: Secondary | ICD-10-CM | POA: Insufficient documentation

## 2020-03-03 DIAGNOSIS — E43 Unspecified severe protein-calorie malnutrition: Secondary | ICD-10-CM | POA: Diagnosis present

## 2020-03-03 DIAGNOSIS — R0902 Hypoxemia: Secondary | ICD-10-CM | POA: Diagnosis present

## 2020-03-03 DIAGNOSIS — M79641 Pain in right hand: Secondary | ICD-10-CM | POA: Insufficient documentation

## 2020-03-03 DIAGNOSIS — W2209XA Striking against other stationary object, initial encounter: Secondary | ICD-10-CM | POA: Diagnosis not present

## 2020-03-03 DIAGNOSIS — Z885 Allergy status to narcotic agent status: Secondary | ICD-10-CM | POA: Diagnosis not present

## 2020-03-03 DIAGNOSIS — G40909 Epilepsy, unspecified, not intractable, without status epilepticus: Secondary | ICD-10-CM | POA: Insufficient documentation

## 2020-03-03 DIAGNOSIS — Z79899 Other long term (current) drug therapy: Secondary | ICD-10-CM | POA: Diagnosis not present

## 2020-03-03 DIAGNOSIS — I712 Thoracic aortic aneurysm, without rupture: Secondary | ICD-10-CM | POA: Insufficient documentation

## 2020-03-03 DIAGNOSIS — F17200 Nicotine dependence, unspecified, uncomplicated: Secondary | ICD-10-CM | POA: Diagnosis not present

## 2020-03-03 DIAGNOSIS — Z6833 Body mass index (BMI) 33.0-33.9, adult: Secondary | ICD-10-CM | POA: Diagnosis not present

## 2020-03-03 DIAGNOSIS — Z8673 Personal history of transient ischemic attack (TIA), and cerebral infarction without residual deficits: Secondary | ICD-10-CM | POA: Diagnosis not present

## 2020-03-03 DIAGNOSIS — I10 Essential (primary) hypertension: Secondary | ICD-10-CM | POA: Insufficient documentation

## 2020-03-03 DIAGNOSIS — R4182 Altered mental status, unspecified: Secondary | ICD-10-CM

## 2020-03-03 DIAGNOSIS — Z8669 Personal history of other diseases of the nervous system and sense organs: Secondary | ICD-10-CM | POA: Insufficient documentation

## 2020-03-03 DIAGNOSIS — F10129 Alcohol abuse with intoxication, unspecified: Secondary | ICD-10-CM | POA: Insufficient documentation

## 2020-03-03 DIAGNOSIS — I6782 Cerebral ischemia: Secondary | ICD-10-CM | POA: Diagnosis not present

## 2020-03-03 DIAGNOSIS — R4020 Unspecified coma: Secondary | ICD-10-CM | POA: Diagnosis not present

## 2020-03-03 DIAGNOSIS — E538 Deficiency of other specified B group vitamins: Secondary | ICD-10-CM | POA: Diagnosis not present

## 2020-03-03 DIAGNOSIS — R404 Transient alteration of awareness: Secondary | ICD-10-CM | POA: Diagnosis not present

## 2020-03-03 DIAGNOSIS — R52 Pain, unspecified: Secondary | ICD-10-CM | POA: Diagnosis not present

## 2020-03-03 DIAGNOSIS — E669 Obesity, unspecified: Secondary | ICD-10-CM | POA: Diagnosis not present

## 2020-03-03 DIAGNOSIS — J439 Emphysema, unspecified: Secondary | ICD-10-CM | POA: Diagnosis not present

## 2020-03-03 LAB — CBC WITH DIFFERENTIAL/PLATELET
Abs Immature Granulocytes: 0.04 10*3/uL (ref 0.00–0.07)
Basophils Absolute: 0 10*3/uL (ref 0.0–0.1)
Basophils Relative: 0 %
Eosinophils Absolute: 0.1 10*3/uL (ref 0.0–0.5)
Eosinophils Relative: 1 %
HCT: 53.1 % — ABNORMAL HIGH (ref 39.0–52.0)
Hemoglobin: 17.5 g/dL — ABNORMAL HIGH (ref 13.0–17.0)
Immature Granulocytes: 0 %
Lymphocytes Relative: 26 %
Lymphs Abs: 2.4 10*3/uL (ref 0.7–4.0)
MCH: 31.1 pg (ref 26.0–34.0)
MCHC: 33 g/dL (ref 30.0–36.0)
MCV: 94.3 fL (ref 80.0–100.0)
Monocytes Absolute: 0.9 10*3/uL (ref 0.1–1.0)
Monocytes Relative: 9 %
Neutro Abs: 5.8 10*3/uL (ref 1.7–7.7)
Neutrophils Relative %: 64 %
Platelets: 211 10*3/uL (ref 150–400)
RBC: 5.63 MIL/uL (ref 4.22–5.81)
RDW: 15.2 % (ref 11.5–15.5)
WBC: 9.2 10*3/uL (ref 4.0–10.5)
nRBC: 0 % (ref 0.0–0.2)

## 2020-03-03 LAB — COMPREHENSIVE METABOLIC PANEL
ALT: 15 U/L (ref 0–44)
AST: 22 U/L (ref 15–41)
Albumin: 4.1 g/dL (ref 3.5–5.0)
Alkaline Phosphatase: 65 U/L (ref 38–126)
Anion gap: 10 (ref 5–15)
BUN: 8 mg/dL (ref 6–20)
CO2: 26 mmol/L (ref 22–32)
Calcium: 9 mg/dL (ref 8.9–10.3)
Chloride: 108 mmol/L (ref 98–111)
Creatinine, Ser: 0.99 mg/dL (ref 0.61–1.24)
GFR calc Af Amer: >60 mL/min (ref >60)
GFR calc non Af Amer: >60 mL/min (ref >60)
Glucose, Bld: 105 mg/dL — ABNORMAL HIGH (ref 70–99)
Potassium: 3.9 mmol/L (ref 3.5–5.1)
Sodium: 144 mmol/L (ref 135–145)
Total Bilirubin: 0.7 mg/dL (ref 0.3–1.2)
Total Protein: 7.6 g/dL (ref 6.5–8.1)

## 2020-03-03 LAB — MAGNESIUM: Magnesium: 1.9 mg/dL (ref 1.7–2.4)

## 2020-03-03 LAB — PHENYTOIN LEVEL, TOTAL: Phenytoin Lvl: <2.5 ug/mL — ABNORMAL LOW (ref 10.0–20.0)

## 2020-03-03 LAB — POC SARS CORONAVIRUS 2 AG -  ED: SARS Coronavirus 2 Ag: NEGATIVE

## 2020-03-03 LAB — CBG MONITORING, ED: Glucose-Capillary: 100 mg/dL — ABNORMAL HIGH (ref 70–99)

## 2020-03-03 LAB — BRAIN NATRIURETIC PEPTIDE: B Natriuretic Peptide: 84.4 pg/mL (ref 0.0–100.0)

## 2020-03-03 LAB — ETHANOL: Alcohol, Ethyl (B): 119 mg/dL — ABNORMAL HIGH (ref <10)

## 2020-03-03 MED ORDER — SODIUM CHLORIDE 0.9 % IV SOLN
1750.0000 mg | Freq: Once | INTRAVENOUS | Status: AC
  Administered 2020-03-04: 1750 mg via INTRAVENOUS
  Filled 2020-03-03: qty 35

## 2020-03-03 MED ORDER — MIDAZOLAM HCL 2 MG/2ML IJ SOLN: 5.0000 mg | Freq: Once | INTRAMUSCULAR | Status: DC

## 2020-03-03 MED ORDER — ZIPRASIDONE MESYLATE 20 MG IM SOLR
20.0000 mg | Freq: Once | INTRAMUSCULAR | Status: AC
  Administered 2020-03-03: 20 mg via INTRAMUSCULAR
  Filled 2020-03-03: qty 20

## 2020-03-03 MED ORDER — SODIUM CHLORIDE 0.9 % IV BOLUS
1000.0000 mL | Freq: Once | INTRAVENOUS | Status: AC
  Administered 2020-03-04: 1000 mL via INTRAVENOUS

## 2020-03-03 MED ORDER — MIDAZOLAM HCL 2 MG/2ML IJ SOLN
2.0000 mg | Freq: Once | INTRAMUSCULAR | Status: AC
  Administered 2020-03-03: 21:00:00 2 mg via INTRAMUSCULAR
  Filled 2020-03-03: qty 2

## 2020-03-03 MED ORDER — STERILE WATER FOR INJECTION IJ SOLN
INTRAMUSCULAR | Status: AC
  Administered 2020-03-03: 1.3 mL
  Filled 2020-03-03: qty 10

## 2020-03-03 NOTE — ED Provider Notes (Signed)
[facility] DEPT Provider Note   CSN: [id number] Arrival date & time: 03/03/20  4098  LEVEL 5 CAVEAT - ALTERED MENTAL STATUS  History Chief Complaint  Patient presents with  . Hand Injury  . Altered Mental Status    Richard [name] is a 49 y.o. male.  HPI 49 year old male brought in by EMS.  Per nurse report (EMS no longer present) the patient called EMS for hand pain after punching a wall.  There was also report of possible seizure-like activity and EMS reported he seemed like he was postictal.  At some point they gave him Versed and now the patient is agitated.  He does have a known history of seizures and is supposed to be on Dilantin.  The patient is demanding to leave and stumbling in the hallway.  He denies alcohol use today.  He knows he is at [facility] but is disoriented to time.  I attempted to call numbers listed in chart for patient's spouse and home phone but no one answered.   Past Medical History:  Diagnosis Date  . Asthma   . Brain bleed ([place])   . Hypertension   . Seizures ([place])    last episode 03/2013    Patient Active Problem List   Diagnosis Date Noted  . Swollen testicle   . Atelectasis   . Syncope 08/26/2015  . Acute encephalopathy 08/26/2015  . Seizures ([employer])   . Acute pulmonary edema (HCC)   . Lower leg DVT (deep venous thromboembolism), acute ([place])   . Protein-calorie malnutrition, severe ([place]) 02/19/2015  . Bacteremia due to Staphylococcus 02/17/2015  . Alcohol withdrawal delirium ([place])   . Acute respiratory failure with hypoxia ([place]) 02/13/2015  . Acute respiratory failure with hypoxemia ([place])   . Altered mental status   . Metabolic acidosis   . Essential hypertension   . Encephalopathy 02/11/2015  . Alcohol intoxication ([place]) 02/11/2015  . Lactic acidosis 02/11/2015  . Bleeding from the nose 03/19/2014  . TIA (transient ischemic attack) 12/24/2013  . Weakness 12/23/2013  . Left-sided weakness 12/23/2013  .  Malignant hypertension 12/23/2013  . Seizure disorder ([place]) 12/23/2013  . Vomiting 06/18/2013  . Chest pain 06/18/2013  . Hypertensive urgency 10/30/2012  . Migraine variant 10/30/2012  . TOBACCO ABUSE 01/11/2008  . HYPERTENSION, BENIGN 11/24/2007  . CHRONIC FRONTAL SINUSITIS 11/24/2007  . Convulsions ([place]) 11/24/2007  . Headache(784.0) 11/24/2007    Past Surgical History:  Procedure Laterality Date  . LEG SURGERY         Family History  Problem Relation Age of Onset  . Cancer Father   . Diabetes Mellitus II Sister     Social History   Tobacco Use  . Smoking status: Current Every Day Smoker    Packs/day: 1.00    Years: 26.00    Pack years: 26.00    Types: Cigarettes  . Smokeless tobacco: Never Used  Substance Use Topics  . Alcohol use: Yes  . Drug use: Not Currently    Types: Cocaine    Comment: last use several years ago     Home Medications Prior to Admission medications   Medication Sig Start Date End Date Taking? Authorizing Provider  Aspirin-Acetaminophen-Caffeine (GOODYS EXTRA STRENGTH) [phone] MG PACK Take 1-3 packets by mouth daily as needed (for pain).     [provider]  cloNIDine (CATAPRES) 0.3 MG tablet Take 1 tablet (0.3 mg total) by mouth 3 (three) times daily. 12/28/17   Hedges, Dellis Filbert, PA-C  losartan (COZAAR)  25 MG tablet Take 1 tablet (25 mg total) by mouth daily. 05/05/19   Carmin Muskrat, MD  losartan (COZAAR) 25 MG tablet Take 1 tablet (25 mg total) by mouth daily. 11/06/19   Varney Biles, MD  phenytoin (DILANTIN) 50 MG tablet Chew 50 mg by mouth 2 (two) times daily.    [provider]  predniSONE (DELTASONE) 20 MG tablet Take 2 tablets (40 mg total) by mouth daily. Patient not taking: Reported on 11/06/2019 11/02/18   Okey Regal, PA-C    Allergies    Dilaudid [hydromorphone hcl] and Tape  Review of Systems   Review of Systems  Unable to perform ROS: Mental status change    Physical Exam Updated Vital  Signs BP (!) 187/127 (BP Location: Right Arm)   Pulse 77   Temp 97.8 F (36.6 C) (Oral)   Resp (!) 22   SpO2 90%   Physical Exam Vitals and nursing note reviewed.  Constitutional:      Appearance: He is well-developed.  HENT:     Head: Normocephalic and atraumatic.     Right Ear: External ear normal.     Left Ear: External ear normal.     Nose: Nose normal.  Eyes:     General:        Right eye: No discharge.        Left eye: No discharge.  Cardiovascular:     Rate and Rhythm: Normal rate and regular rhythm.     Heart sounds: Normal heart sounds.  Pulmonary:     Effort: Pulmonary effort is normal.     Breath sounds: Normal breath sounds.  Abdominal:     Palpations: Abdomen is soft.     Tenderness: There is no abdominal tenderness.  Musculoskeletal:     Cervical back: Neck supple.  Skin:    General: Skin is warm and dry.  Neurological:     Mental Status: He is alert.     Comments: Ambulates but stumbles often. Somewhat slurred speech  Psychiatric:        Behavior: Behavior is agitated.     ED Results / Procedures / Treatments   Labs (all labs ordered are listed, but only abnormal results are displayed) Labs Reviewed  CBC WITH DIFFERENTIAL/PLATELET - Abnormal; Notable for the following components:      Result Value   Hemoglobin 17.5 (*)    HCT 53.1 (*)    All other components within normal limits  COMPREHENSIVE METABOLIC PANEL - Abnormal; Notable for the following components:   Glucose, Bld 105 (*)    All other components within normal limits  ETHANOL - Abnormal; Notable for the following components:   Alcohol, Ethyl (B) 119 (*)    All other components within normal limits  PHENYTOIN LEVEL, TOTAL - Abnormal; Notable for the following components:   Phenytoin Lvl <2.5 (*)    All other components within normal limits  CBG MONITORING, ED - Abnormal; Notable for the following components:   Glucose-Capillary 100 (*)    All other components within normal limits    SARS CORONAVIRUS 2 (TAT 6-24 HRS)  MAGNESIUM  BRAIN NATRIURETIC PEPTIDE  POC SARS CORONAVIRUS 2 AG -  ED    EKG EKG Interpretation  Date/Time:  Sunday March 03 2020 20:18:26 EDT Ventricular Rate:  106 PR Interval:    QRS Duration: 86 QT Interval:  347 QTC Calculation: 461 R Axis:   62 Text Interpretation: Sinus tachycardia Biatrial enlargement Left ventricular hypertrophy ST elev, probable normal early  repol pattern rate is faster, otherwise ST elevations similar to May 2020 Confirmed by Sherwood Gambler [phone]) on 03/03/2020 8:29:31 PM   Radiology CT Head Wo Contrast  Result Date: 03/03/2020 CLINICAL DATA:  Seizure EXAM: CT HEAD WITHOUT CONTRAST TECHNIQUE: Contiguous axial images were obtained from the base of the skull through the vertex without intravenous contrast. 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 Diffuse chronic small vessel disease throughout the deep white matter, age advanced. No acute intracranial abnormality. Specifically, no hemorrhage, hydrocephalus, mass lesion, acute infarction, or significant intracranial injury. Vascular: No hyperdense vessel or unexpected calcification. Skull: No acute calvarial abnormality. Sinuses/Orbits: Mucoperiosteal thickening in the left maxillary sinus. No acute findings. Other: None IMPRESSION: Old left frontal infarct. Extensive chronic small vessel disease. No acute intracranial abnormality. Electronically Signed   By: Rolm Baptise M.D.   On: 03/03/2020 20:52   DG Chest Portable 1 View  Result Date: 03/03/2020 CLINICAL DATA:  Hypoxia EXAM: PORTABLE CHEST 1 VIEW COMPARISON:  05/05/2019 FINDINGS: The heart size and mediastinal contours are within normal limits. Both lungs are clear. The visualized skeletal structures are unremarkable. IMPRESSION: Negative. Electronically Signed   By: Rolm Baptise M.D.   On: 03/03/2020 20:28   DG Hand Complete Left  Result Date: 03/03/2020 CLINICAL DATA:   Punched wall EXAM: LEFT HAND - COMPLETE 3+ VIEW COMPARISON:  None. FINDINGS: There is no evidence of fracture or dislocation. There is no evidence of arthropathy or other focal bone abnormality. Soft tissues are unremarkable. IMPRESSION: Negative. Electronically Signed   By: Rolm Baptise M.D.   On: 03/03/2020 19:58    Procedures .Critical Care Performed by: Sherwood Gambler, MD Authorized by: Sherwood Gambler, MD   Critical care provider statement:    Critical care time (minutes):  50   Critical care time was exclusive of:  Separately billable procedures and treating other patients   Critical care was necessary to treat or prevent imminent or life-threatening deterioration of the following conditions:  Respiratory failure and CNS failure or compromise   Critical care was time spent personally by me on the following activities:  Discussions with consultants, evaluation of patient's response to treatment, examination of patient, ordering and performing treatments and interventions, ordering and review of laboratory studies, ordering and review of radiographic studies, pulse oximetry, re-evaluation of patient's condition, obtaining history from patient or surrogate and review of old charts   (including critical care time)  Medications Ordered in ED Medications  phenytoin (DILANTIN) 1,750 mg in sodium chloride 0.9 % 250 mL IVPB (1,750 mg Intravenous New Bag/Given 03/04/20 0003)  sodium chloride 0.9 % bolus 1,000 mL (has no administration in time range)  ziprasidone (GEODON) injection 20 mg (20 mg Intramuscular Given 03/03/20 1940)  sterile water (preservative free) injection (1.3 mLs  Given 03/03/20 1940)  midazolam (VERSED) injection 2 mg (2 mg Intramuscular Given 03/03/20 2120)    ED Course  I have reviewed the triage vital signs and the nursing notes.  Pertinent labs & imaging results that were available during my care of the patient were reviewed by me and considered in my medical decision  making (see chart for details).  Clinical Course as of Mar 04 30  Nancy Fetter Mar 03, 2020  2049 Patient is sleepier after the first dose of IM Geodon.  However after the nurse attempted to start IV, patient is now more awake and alert and wanting to leave.  He still clinically is altered and unable to be cleared medically.  I think this is probably still from medications/postictal/possibly alcohol.  He was noted to be hypoxic prior to the Geodon and so he still needs medical work-up and is not able to make medical decision for himself.  Will give IM Versed.   [SG]    Clinical Course User Index [SG] Sherwood Gambler, MD   MDM Rules/Calculators/A&P                      Patient is complex.  When I am for seeing patient he is asking to leave and walking out but stumbling and staggering like he is intoxicated.  He knows he is at [facility] but disoriented to date.  Denies alcohol use.  Given this, he does not seem capable of making medical decisions and seems altered so Geodon was given.  He was also hypoxic, unclear if this is acutely pathologic versus the fact that he got benzos by EMS.  He has remained hypoxic for hours in the ED.  Chest x-ray is clear.  No obvious abnormal lung sounds.  Point-of-care Covid is negative.  Will send PCR.  Given he has remained hypoxic, will get CT angiography of chest.  He still is acting confused but again he has been postictal, has alcohol in his system, and been given multiple sedating meds.  My suspicion of meningitis/encephalitis is quite low with normal WBC and no fever.  Could have occult pneumonia or Covid.  Will rule out PE.  My suspicion is since he has been in this state for hours that he would not be a good candidate to be observed in the ED and then discharged.  Discussed with hospitalist for admission.  Thorne Buchan was evaluated in Emergency Department on 03/04/2020 for the symptoms described in the history of present illness. He was evaluated in the  context of the global COVID-19 pandemic, which necessitated consideration that the patient might be at risk for infection with the SARS-CoV-2 virus that causes COVID-19. Institutional protocols and algorithms that pertain to the evaluation of patients at risk for COVID-19 are in a state of rapid change based on information released by regulatory bodies including the CDC and federal and state organizations. These policies and algorithms were followed during the patient's care in the ED.  Final Clinical Impression(s) / ED Diagnoses Final diagnoses:  Altered mental status, unspecified altered mental status type  Acute respiratory failure with hypoxia [facility])    Rx / DC Orders ED Discharge Orders    None       Sherwood Gambler, MD 03/04/20 [phone]

## 2020-03-03 NOTE — ED Notes (Signed)
Patient transported to CT 

## 2020-03-03 NOTE — ED Triage Notes (Signed)
Patient in by EMS and police due to punching a wall and injuring left hand, according to wife patient has been drinking and has been having seizures which causes him to become somnolent, afterwards he becomes combative, receive 5 of versed from EMS and restrained upon arrival.

## 2020-03-04 ENCOUNTER — Encounter (HOSPITAL_COMMUNITY): Payer: Self-pay | Admitting: Family Medicine

## 2020-03-04 ENCOUNTER — Observation Stay (HOSPITAL_COMMUNITY): Payer: Medicare HMO

## 2020-03-04 ENCOUNTER — Telehealth: Payer: Self-pay

## 2020-03-04 DIAGNOSIS — I10 Essential (primary) hypertension: Secondary | ICD-10-CM | POA: Diagnosis not present

## 2020-03-04 DIAGNOSIS — R569 Unspecified convulsions: Secondary | ICD-10-CM

## 2020-03-04 DIAGNOSIS — F10921 Alcohol use, unspecified with intoxication delirium: Secondary | ICD-10-CM

## 2020-03-04 DIAGNOSIS — F10129 Alcohol abuse with intoxication, unspecified: Secondary | ICD-10-CM | POA: Diagnosis not present

## 2020-03-04 DIAGNOSIS — R4182 Altered mental status, unspecified: Secondary | ICD-10-CM | POA: Diagnosis not present

## 2020-03-04 DIAGNOSIS — J9601 Acute respiratory failure with hypoxia: Secondary | ICD-10-CM | POA: Diagnosis not present

## 2020-03-04 DIAGNOSIS — R0902 Hypoxemia: Secondary | ICD-10-CM | POA: Diagnosis present

## 2020-03-04 LAB — CBC
HCT: 54.8 % — ABNORMAL HIGH (ref 39.0–52.0)
Hemoglobin: 17.3 g/dL — ABNORMAL HIGH (ref 13.0–17.0)
MCH: 30.7 pg (ref 26.0–34.0)
MCHC: 31.6 g/dL (ref 30.0–36.0)
MCV: 97.2 fL (ref 80.0–100.0)
Platelets: 200 10*3/uL (ref 150–400)
RBC: 5.64 MIL/uL (ref 4.22–5.81)
RDW: 15.6 % — ABNORMAL HIGH (ref 11.5–15.5)
WBC: 10.5 10*3/uL (ref 4.0–10.5)
nRBC: 0 % (ref 0.0–0.2)

## 2020-03-04 LAB — PHOSPHORUS: Phosphorus: 3.9 mg/dL (ref 2.5–4.6)

## 2020-03-04 LAB — CK
Total CK: 189 U/L (ref 49–397)
Total CK: 368 U/L (ref 49–397)

## 2020-03-04 LAB — COMPREHENSIVE METABOLIC PANEL
ALT: 13 U/L (ref 0–44)
AST: 23 U/L (ref 15–41)
Albumin: 3.7 g/dL (ref 3.5–5.0)
Alkaline Phosphatase: 62 U/L (ref 38–126)
Anion gap: 8 (ref 5–15)
BUN: 7 mg/dL (ref 6–20)
CO2: 24 mmol/L (ref 22–32)
Calcium: 8.9 mg/dL (ref 8.9–10.3)
Chloride: 111 mmol/L (ref 98–111)
Creatinine, Ser: 0.85 mg/dL (ref 0.61–1.24)
GFR calc Af Amer: >60 mL/min (ref >60)
GFR calc non Af Amer: >60 mL/min (ref >60)
Glucose, Bld: 115 mg/dL — ABNORMAL HIGH (ref 70–99)
Potassium: 4.3 mmol/L (ref 3.5–5.1)
Sodium: 143 mmol/L (ref 135–145)
Total Bilirubin: 0.5 mg/dL (ref 0.3–1.2)
Total Protein: 6.9 g/dL (ref 6.5–8.1)

## 2020-03-04 LAB — VITAMIN B12: Vitamin B-12: 162 pg/mL — ABNORMAL LOW (ref 180–914)

## 2020-03-04 LAB — MAGNESIUM: Magnesium: 1.7 mg/dL (ref 1.7–2.4)

## 2020-03-04 LAB — RAPID URINE DRUG SCREEN, HOSP PERFORMED
Amphetamines: NOT DETECTED
Barbiturates: NOT DETECTED
Benzodiazepines: POSITIVE — AB
Cocaine: NOT DETECTED
Opiates: NOT DETECTED
Tetrahydrocannabinol: NOT DETECTED

## 2020-03-04 LAB — FOLATE: Folate: 7.3 ng/mL (ref >5.9)

## 2020-03-04 LAB — HIV ANTIBODY (ROUTINE TESTING W REFLEX): HIV Screen 4th Generation wRfx: NONREACTIVE

## 2020-03-04 LAB — AMMONIA: Ammonia: 44 umol/L — ABNORMAL HIGH (ref 9–35)

## 2020-03-04 LAB — SARS CORONAVIRUS 2 (TAT 6-24 HRS): SARS Coronavirus 2: NEGATIVE

## 2020-03-04 MED ORDER — LORAZEPAM 2 MG/ML IJ SOLN: 1.0000 mg | INTRAMUSCULAR | Status: DC | PRN

## 2020-03-04 MED ORDER — LORAZEPAM 2 MG/ML IJ SOLN: 0.0000 mg | Freq: Four times a day (QID) | INTRAMUSCULAR | Status: DC

## 2020-03-04 MED ORDER — ONDANSETRON HCL 4 MG/2ML IJ SOLN: 4.0000 mg | Freq: Four times a day (QID) | INTRAMUSCULAR | Status: DC | PRN

## 2020-03-04 MED ORDER — ENOXAPARIN SODIUM 40 MG/0.4ML ~~LOC~~ SOLN: 40.0000 mg | SUBCUTANEOUS | Status: DC

## 2020-03-04 MED ORDER — FOLIC ACID 1 MG PO TABS
1.0000 mg | ORAL_TABLET | Freq: Every day | ORAL | Status: DC
  Administered 2020-03-04: 1 mg via ORAL
  Filled 2020-03-04: qty 1

## 2020-03-04 MED ORDER — PHENYTOIN 50 MG PO CHEW
50.0000 mg | CHEWABLE_TABLET | Freq: Two times a day (BID) | ORAL | Status: DC
  Administered 2020-03-04: 50 mg via ORAL
  Filled 2020-03-04: qty 1

## 2020-03-04 MED ORDER — ACETAMINOPHEN 650 MG RE SUPP: 650.0000 mg | Freq: Four times a day (QID) | RECTAL | Status: DC | PRN

## 2020-03-04 MED ORDER — LABETALOL HCL 5 MG/ML IV SOLN
20.0000 mg | INTRAVENOUS | Status: DC | PRN
  Administered 2020-03-04 (×2): 20 mg via INTRAVENOUS
  Filled 2020-03-04 (×3): qty 4

## 2020-03-04 MED ORDER — THIAMINE HCL 100 MG/ML IJ SOLN: 100.0000 mg | Freq: Every day | INTRAMUSCULAR | Status: DC

## 2020-03-04 MED ORDER — IOHEXOL 350 MG/ML SOLN
100.0000 mL | Freq: Once | INTRAVENOUS | Status: AC | PRN
  Administered 2020-03-04: 100 mL via INTRAVENOUS

## 2020-03-04 MED ORDER — ACETAMINOPHEN 325 MG PO TABS: 650.0000 mg | ORAL_TABLET | Freq: Four times a day (QID) | ORAL | Status: DC | PRN

## 2020-03-04 MED ORDER — LORAZEPAM 1 MG PO TABS: 1.0000 mg | ORAL_TABLET | ORAL | Status: DC | PRN

## 2020-03-04 MED ORDER — THIAMINE HCL 100 MG PO TABS
100.0000 mg | ORAL_TABLET | Freq: Every day | ORAL | Status: DC
  Administered 2020-03-04: 100 mg via ORAL
  Filled 2020-03-04: qty 1

## 2020-03-04 MED ORDER — NICOTINE 14 MG/24HR TD PT24
14.0000 mg | MEDICATED_PATCH | Freq: Every day | TRANSDERMAL | Status: DC
  Filled 2020-03-04: qty 1

## 2020-03-04 MED ORDER — ONDANSETRON HCL 4 MG PO TABS: 4.0000 mg | ORAL_TABLET | Freq: Four times a day (QID) | ORAL | Status: DC | PRN

## 2020-03-04 MED ORDER — CLONIDINE HCL 0.3 MG PO TABS: 0.3000 mg | ORAL_TABLET | Freq: Two times a day (BID) | ORAL | 0 refills | Status: DC

## 2020-03-04 MED ORDER — PHENYTOIN 50 MG PO CHEW: 50.0000 mg | CHEWABLE_TABLET | Freq: Two times a day (BID) | ORAL | 0 refills | Status: DC

## 2020-03-04 MED ORDER — METOPROLOL TARTRATE 5 MG/5ML IV SOLN
5.0000 mg | Freq: Once | INTRAVENOUS | Status: AC
  Administered 2020-03-04: 02:00:00 5 mg via INTRAVENOUS
  Filled 2020-03-04: qty 5

## 2020-03-04 MED ORDER — SODIUM CHLORIDE 0.9 % IV SOLN: INTRAVENOUS | Status: DC

## 2020-03-04 MED ORDER — LORAZEPAM 2 MG/ML IJ SOLN: 0.0000 mg | Freq: Two times a day (BID) | INTRAMUSCULAR | Status: DC

## 2020-03-04 MED ORDER — LOSARTAN POTASSIUM 25 MG PO TABS
25.0000 mg | ORAL_TABLET | Freq: Every day | ORAL | Status: DC
  Administered 2020-03-04: 11:00:00 25 mg via ORAL
  Filled 2020-03-04: qty 1

## 2020-03-04 MED ORDER — IPRATROPIUM-ALBUTEROL 0.5-2.5 (3) MG/3ML IN SOLN: 3.0000 mL | RESPIRATORY_TRACT | Status: DC | PRN

## 2020-03-04 MED ORDER — NICOTINE 21 MG/24HR TD PT24: 21.0000 mg | MEDICATED_PATCH | TRANSDERMAL | 0 refills | Status: AC

## 2020-03-04 MED ORDER — VITAMIN B-12 1000 MCG PO TABS: 1000.0000 ug | ORAL_TABLET | Freq: Every day | ORAL | 0 refills | Status: DC

## 2020-03-04 MED ORDER — CLONIDINE HCL 0.1 MG PO TABS
0.3000 mg | ORAL_TABLET | Freq: Three times a day (TID) | ORAL | Status: DC
  Administered 2020-03-04: 0.3 mg via ORAL
  Filled 2020-03-04: qty 3

## 2020-03-04 MED ORDER — ADULT MULTIVITAMIN W/MINERALS CH
1.0000 | ORAL_TABLET | Freq: Every day | ORAL | Status: DC
  Administered 2020-03-04: 11:00:00 1 via ORAL
  Filled 2020-03-04: qty 1

## 2020-03-04 NOTE — Telephone Encounter (Signed)
-----   Message from Juanito Doom, MD sent at 03/04/2020  9:01 AM EDT ----- Hi,  This gentleman has a nasty lung mass and is leaving AMA from the hospital today.  Can someone call him this week for an appointment/consult?  Thanks, Ruby Cola

## 2020-03-04 NOTE — Progress Notes (Signed)
[facility]  Case discussed with Dr. Bonner Puna this morning.  Briefly, admitted in setting of alcohol withdrawal, leaving AMA today.  CT angiogram performed showing two masses in R lung highly suspicious of lung cancer.  Still smoking cigarettes.  I met with him briefly and told him that I'm very worried he has lung cancer and he needs a biopsy ASAP.  He is insistent that he is leaving AMA.  Will ask my office to set up an appointment with any pulmonologist this week.  He and his wife voiced understanding.  Roselie Awkward, MD [facility] Pager: [phone] Cell: [phone] If no response, call [phone]

## 2020-03-04 NOTE — Progress Notes (Signed)
Pt admitted to 1516 from ED. A&Ox2 self and place, but not time or situation. Attempted admission questions but pt answers many questions inappropriately. Pts wife does not answered when called on phone. Reviewed plan of care and safety precautions. Pt impulsive getting OOB to BR, pulling wires and not waiting for assistance. Bed alarm and floor mats in place. Seizure precautions initiated. CIWA score currently 4. Hortencia Conradi RN

## 2020-03-04 NOTE — Telephone Encounter (Signed)
lmtcb on named voicemail to schedule HFU next available with an APP/MD

## 2020-03-04 NOTE — Discharge Summary (Signed)
Physician Discharge Summary  Richard [name] NWG:[id number] DOB: August 26, 1971 DOA: 03/03/2020  PCP: Patient, No Pcp Per  Admit date: 03/03/2020 Discharge date: 03/04/2020  Admitted From: Home Disposition: Home   Recommendations for Outpatient Follow-up:  1. Follow up with PCP soon, tobacco and EtOH cessation counseling 2. Follow up with pulmonology ASAP ([facility]) to arrange work up of suspicious lung masses.  Home Health: None Equipment/Devices: None Discharge Condition: Stable CODE STATUS: Full Diet recommendation: As tolerated  Brief/Interim Summary: Per HPI by Dr. Claria Dice: "this is a 49y/o male who presents with location.  Per report the patient came in with complaints of hand injury after he had punched a wall at home.  In the ER the patient's hand shows no evidence of injury.  The patient however appears confused he appears intoxicated and walks back and forth in the ER.  Because of the patient's agitation he has been given a total of 7 mg of IV Versed and 20 mg of Geodon.  During my interview the patient was more sleepy and awake.  I was unable to obtain any history.  Also per ER physician the patient has been persistently hypoxic in the ER with sats in the 85%.  Patient's chest x-ray is normal.  Hospitalist have been asked admit as the patient has been in the ER over 5 hours it is not expected that his mentation will improve shortly.  All history obtained from the ER physician unsuccessful attempts were made to contact the family.  Per report the patient is also confused and appeared postictal and brought in ambulance.  It is reported that he will had witnessed seizures at home.  Here in the ER his Dilantin level is low.  He has been loaded by the ER physician."  Hospital Course: He was admitted, CT chest showed masses (see below), though no evidence of pneumonia, supplemental oxygen was quickly weaned. EtOH level noted to be elevated and later that morning he seemed to  become more alert, having had no seizure-like episodes while in the hospital. There was no evidence of immediate withdrawal from alcohol and the patient requested to leave to smoke a cigarette. He threatened to leave AMA despite being told he may have lung cancer and there is a very high likelihood of him going into withdrawal from alcohol and having seizures. Pulmonology consulted and graciously reviewed the scans and spoke with the patient very soon and will expedite work up. B12 is found to be low and supplement is prescribed. Refill of clonidine is also prescribed to aid with withdrawal and due to elevated BP.    Discharge Diagnoses:  Active Problems:   Altered mental status   Essential hypertension   Acute respiratory failure with hypoxemia (HCC)   Protein-calorie malnutrition, severe (HCC)   Seizure (HCC)   Alcohol abuse with intoxication ([place])   Hypoxia  HTN: Management as below.  Vitamin B12 deficiency:  - Supplement. Stop alcohol.  Obesity: BMI 33.   Tobacco use: Cessation counseling advised, recommended using nicotine patch which the patient was not having. Recommended he follow up with PCP for medication assistance by a provider that can follow him longitudinally.   EtOH abuse and intoxication: No longer intoxicated. Based on interview, he does retain capacity to evaluate his medical situation and the recommendations of the healthcare team, weigh risks and benefits, and make informed decisions regarding his medical care.  Acute hypoxic respiratory failure: Likely due to intoxication which has resolved. He's walking halls without oxygen.  Possible  seizures:  - Continue dilantin, avoid withdrawal. The patient reports he will go home and drink alcohol. Will not prescribe librium or similar.  Discharge Instructions Discharge Instructions    Diet - low sodium heart healthy   Complete by: As directed    Discharge instructions   Complete by: As directed    - Take vitamin B12  supplements. Your level was very low. - Continue taking seizure medication. A new prescription for dilantin is being sent to your [facility] pharmacy.  - Continue taking medications for high blood pressure as below - Stop drinking alcohol - Stop smoking cigarettes. If you would like assistance with medications for this, follow up with your doctor. Nicotine patches have been prescribed as well.  - You MUST follow up with pulmonology (lung doctor) to arrange testing on the masses in your lung. These are highly suspicious for lung cancer and need to be worked up as soon as possible.   Increase activity slowly   Complete by: As directed      Allergies as of 03/04/2020      Reactions   Dilaudid [hydromorphone Hcl] Nausea Only, Other (See Comments)   Bradycardia and hyperthermia, too   Tape Itching, Dermatitis      Medication List    STOP taking these medications   predniSONE 20 MG tablet Commonly known as: DELTASONE     TAKE these medications   cloNIDine 0.3 MG tablet Commonly known as: CATAPRES Take 1 tablet (0.3 mg total) by mouth 2 (two) times daily. What changed: when to take this   Goodys Extra Strength 500-325-65 MG Pack Generic drug: Aspirin-Acetaminophen-Caffeine Take 1-3 packets by mouth daily as needed (for pain).   losartan 25 MG tablet Commonly known as: COZAAR Take 1 tablet (25 mg total) by mouth daily. What changed: Another medication with the same name was removed. Continue taking this medication, and follow the directions you see here.   nicotine 21 mg/24hr patch Commonly known as: NICODERM CQ - dosed in mg/24 hours Place 1 patch (21 mg total) onto the skin daily.   phenytoin 50 MG tablet Commonly known as: DILANTIN Chew 1 tablet (50 mg total) by mouth 2 (two) times daily.   vitamin B-12 1000 MCG tablet Commonly known as: CYANOCOBALAMIN Take 1 tablet (1,000 mcg total) by mouth daily.      Follow-up Information    [facility]. Schedule an  appointment as soon as possible for a visit.   Specialty: Pulmonology Why: to discuss biopsy for masses in your lung Contact information: [place] [address] [phone]         Allergies  Allergen Reactions  . Dilaudid [Hydromorphone Hcl] Nausea Only and Other (See Comments)    Bradycardia and hyperthermia, too  . Tape Itching and Dermatitis    Consultations:  Pulmonary, Dr. Lake Bells  Procedures/Studies: CT Head Wo Contrast  Result Date: 03/03/2020 CLINICAL DATA:  Seizure EXAM: CT HEAD WITHOUT CONTRAST TECHNIQUE: Contiguous axial images were obtained from the base of the skull through the vertex without intravenous contrast. 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 Diffuse chronic small vessel disease throughout the deep white matter, age advanced. No acute intracranial abnormality. Specifically, no hemorrhage, hydrocephalus, mass lesion, acute infarction, or significant intracranial injury. Vascular: No hyperdense vessel or unexpected calcification. Skull: No acute calvarial abnormality. Sinuses/Orbits: Mucoperiosteal thickening in the left maxillary sinus. No acute findings. Other: None IMPRESSION: Old left frontal infarct. Extensive chronic small vessel  disease. No acute intracranial abnormality. Electronically Signed   By: Rolm Baptise M.D.   On: 03/03/2020 20:52   CT Angio Chest PE W and/or Wo Contrast  Result Date: 03/04/2020 CLINICAL DATA:  Acute respiratory failure with hypoxemia EXAM: CT ANGIOGRAPHY CHEST WITH CONTRAST TECHNIQUE: Multidetector CT imaging of the chest was performed using the standard protocol during bolus administration of intravenous contrast. Multiplanar CT image reconstructions and MIPs were obtained to evaluate the vascular anatomy. CONTRAST:  151mL OMNIPAQUE IOHEXOL 350 MG/ML SOLN COMPARISON:  02/26/2015 FINDINGS: Cardiovascular: No filling defects in the pulmonary  arteries to suggest pulmonary emboli. Heart is normal size. Slight aneurysmal dilatation of the ascending thoracic aorta, 4 cm. Mediastinum/Nodes: No mediastinal, hilar, or axillary adenopathy. Trachea and esophagus are unremarkable. Thyroid unremarkable. Lungs/Pleura: Mild centrilobular emphysema. Right upper lobe spiculated nodule measures 12 mm on image 41, new since prior study. Superior segment right lower lobe posteromedial mass measures 2.6 cm. Left lung clear. No effusions. Upper Abdomen: Imaging into the upper abdomen shows no acute findings. Diffuse enlargement of the left adrenal gland, likely hyperplasia. Musculoskeletal: Chest wall soft tissues are unremarkable. No acute bony abnormality. Review of the MIP images confirms the above findings. IMPRESSION: 2.6 cm mass in the superior segment of the right lower lobe. 1.2 cm spiculated nodule in the right upper lobe. Both areas are concerning for cancer. Recommend cardiothoracic surgical consultation. These could be further characterized with PET CT and tissue diagnosis. No evidence of pulmonary embolus. 4 cm ascending thoracic aortic aneurysm. Recommend annual imaging followup by CTA or MRA. This recommendation follows 2010 ACCF/AHA/AATS/ACR/ASA/SCA/SCAI/SIR/STS/SVM Guidelines for the Diagnosis and Management of Patients with Thoracic Aortic Disease. Circulation. 2010; 121: V035-K093. Aortic aneurysm NOS (ICD10-I71.9) Emphysema (ICD10-J43.9). Electronically Signed   By: Rolm Baptise M.D.   On: 03/04/2020 02:27   DG Chest Portable 1 View  Result Date: 03/03/2020 CLINICAL DATA:  Hypoxia EXAM: PORTABLE CHEST 1 VIEW COMPARISON:  05/05/2019 FINDINGS: The heart size and mediastinal contours are within normal limits. Both lungs are clear. The visualized skeletal structures are unremarkable. IMPRESSION: Negative. Electronically Signed   By: Rolm Baptise M.D.   On: 03/03/2020 20:28   DG Hand Complete Left  Result Date: 03/03/2020 CLINICAL DATA:  Punched wall  EXAM: LEFT HAND - COMPLETE 3+ VIEW COMPARISON:  None. FINDINGS: There is no evidence of fracture or dislocation. There is no evidence of arthropathy or other focal bone abnormality. Soft tissues are unremarkable. IMPRESSION: Negative. Electronically Signed   By: Rolm Baptise M.D.   On: 03/03/2020 19:58    Subjective: Stating he's leaving this morning. Will go drink at home. Has no desire to stop drinking. Does not think he had a seizure.  Discharge Exam: Vitals:   03/04/20 0915 03/04/20 0917  BP: (!) 157/117 (!) 166/110  Pulse: 79 75  Resp:  18  Temp: 98.3 F (36.8 C) 98.3 F (36.8 C)  SpO2: 91% 97%   General: Pt is alert, awake, oriented without tremor, not in acute distress Cardiovascular: RRR, S1/S2 +, no rubs, no gallops Respiratory: CTA bilaterally, no wheezing, no rhonchi Abdominal: Soft, NT, ND, bowel sounds +  Labs: BNP (last 3 results) Recent Labs    05/05/19 1355 03/03/20 2121  BNP 26.6 81.8   Basic Metabolic Panel: Recent Labs  Lab 03/03/20 2121 03/04/20 0555  NA 144 143  K 3.9 4.3  CL 108 111  CO2 26 24  GLUCOSE 105* 115*  BUN 8 7  CREATININE 0.99 0.85  CALCIUM 9.0 8.9  MG 1.9 1.7  PHOS  --  3.9   Liver Function Tests: Recent Labs  Lab 03/03/20 2121 03/04/20 0555  AST 22 23  ALT 15 13  ALKPHOS 65 62  BILITOT 0.7 0.5  PROT 7.6 6.9  ALBUMIN 4.1 3.7   No results for input(s): LIPASE, AMYLASE in the last 168 hours. Recent Labs  Lab 03/04/20 0555  AMMONIA 44*   CBC: Recent Labs  Lab 03/03/20 2121 03/04/20 0555  WBC 9.2 10.5  NEUTROABS 5.8  --   HGB 17.5* 17.3*  HCT 53.1* 54.8*  MCV 94.3 97.2  PLT 211 200   Cardiac Enzymes: Recent Labs  Lab 03/04/20 0048 03/04/20 0555  CKTOTAL 189 368   BNP: Invalid input(s): POCBNP CBG: Recent Labs  Lab 03/03/20 2119  GLUCAP 100*   D-Dimer No results for input(s): DDIMER in the last 72 hours. Hgb A1c No results for input(s): HGBA1C in the last 72 hours. Lipid Profile No results for  input(s): CHOL, HDL, LDLCALC, TRIG, CHOLHDL, LDLDIRECT in the last 72 hours. Thyroid function studies No results for input(s): TSH, T4TOTAL, T3FREE, THYROIDAB in the last 72 hours.  Invalid input(s): FREET3 Anemia work up Recent Labs    03/04/20 0555  VITAMINB12 162*  FOLATE 7.3   Urinalysis    Component Value Date/Time   COLORURINE AMBER (A) 07/15/2015 2137   APPEARANCEUR CLEAR 07/15/2015 2137   LABSPEC 1.037 (H) 07/15/2015 2137   PHURINE 5.5 07/15/2015 2137   GLUCOSEU NEGATIVE 07/15/2015 2137   HGBUR NEGATIVE 07/15/2015 2137   HGBUR negative 01/16/2011 1530   BILIRUBINUR SMALL (A) 07/15/2015 2137   KETONESUR 15 (A) 07/15/2015 2137   PROTEINUR 30 (A) 07/15/2015 2137   UROBILINOGEN 1.0 07/15/2015 2137   NITRITE NEGATIVE 07/15/2015 2137   LEUKOCYTESUR NEGATIVE 07/15/2015 2137    Microbiology Recent Results (from the past 240 hour(s))  SARS CORONAVIRUS 2 (TAT 6-24 HRS) Nasopharyngeal Nasopharyngeal Swab     Status: None   Collection Time: 03/03/20  3:14 PM   Specimen: Nasopharyngeal Swab  Result Value Ref Range Status   SARS Coronavirus 2 NEGATIVE NEGATIVE Final    Comment: (NOTE) SARS-CoV-2 target nucleic acids are NOT DETECTED. The SARS-CoV-2 RNA is generally detectable in upper and lower respiratory specimens during the acute phase of infection. Negative results do not preclude SARS-CoV-2 infection, do not rule out co-infections with other pathogens, and should not be used as the sole basis for treatment or other patient management decisions. Negative results must be combined with clinical observations, patient history, and epidemiological information. The expected result is Negative. Fact Sheet for Patients: SugarRoll.be Fact Sheet for Healthcare Providers: https://www.woods-mathews.com/ This test is not yet approved or cleared by the [place] FDA and  has been authorized for detection and/or diagnosis of SARS-CoV-2  by FDA under an Emergency Use Authorization (EUA). This EUA will remain  in effect (meaning this test can be used) for the duration of the COVID-19 declaration under Section 56 4(b)(1) of the Act, 21 U.S.C. section 360bbb-3(b)(1), unless the authorization is terminated or revoked sooner. Performed at [facility], [place] [address], [place], [place] [address]     Time coordinating discharge: Approximately 40 minutes  Patrecia Pour, MD  [facility] 03/04/2020, 10:32 AM

## 2020-03-04 NOTE — ED Notes (Signed)
Patient transported to CT 

## 2020-03-04 NOTE — H&P (Signed)
PCP:   Patient, No Pcp Per   Chief Complaint: Right hand pain   HPI: this is a 49y/o male who presents with location.  Per report the patient came in with complaints of hand injury after he had punched a wall at home.  In the ER the patient's hand shows no evidence of injury.  The patient however appears confused he appears intoxicated and walks back and forth in the ER.  Because of the patient's agitation he has been given a total of 7 mg of IV Versed and 20 mg of Geodon.  During my interview the patient was more sleepy and awake.  I was unable to obtain any history.  Also per ER physician the patient has been persistently hypoxic in the ER with sats in the 85%.  Patient's chest x-ray is normal.  Hospitalist have been asked admit as the patient has been in the ER over 5 hours it is not expected that his mentation will improve shortly.  All history obtained from the ER physician unsuccessful attempts were made to contact the family.  Per report the patient is also confused and appeared postictal and brought in ambulance.  It is reported that he will had witnessed seizures at home.  Here in the ER his Dilantin level is low.  He has been loaded by the ER physician   Review of Systems:  Unable to obtain due to acuity of patient's condition.  Past Medical History: Past Medical History:  Diagnosis Date  . Asthma   . Brain bleed ([place])   . Hypertension   . Seizures ([place])    last episode 03/2013   Past Surgical History:  Procedure Laterality Date  . LEG SURGERY      Medications: Prior to Admission medications   Medication Sig Start Date End Date Taking? Authorizing Provider  Aspirin-Acetaminophen-Caffeine (GOODYS EXTRA STRENGTH) [id number] MG PACK Take 1-3 packets by mouth daily as needed (for pain).     [provider]  cloNIDine (CATAPRES) 0.3 MG tablet Take 1 tablet (0.3 mg total) by mouth 3 (three) times daily. 12/28/17   Hedges, Dellis Filbert, PA-C  losartan (COZAAR) 25 MG tablet Take  1 tablet (25 mg total) by mouth daily. 05/05/19   Carmin Muskrat, MD  losartan (COZAAR) 25 MG tablet Take 1 tablet (25 mg total) by mouth daily. 11/06/19   Varney Biles, MD  phenytoin (DILANTIN) 50 MG tablet Chew 50 mg by mouth 2 (two) times daily.    [provider]  predniSONE (DELTASONE) 20 MG tablet Take 2 tablets (40 mg total) by mouth daily. Patient not taking: Reported on 11/06/2019 11/02/18   Okey Regal, PA-C    Allergies:   Allergies  Allergen Reactions  . Dilaudid [Hydromorphone Hcl] Nausea Only and Other (See Comments)    Bradycardia and hyperthermia, too  . Tape Itching and Dermatitis    Social History:  reports that he has been smoking cigarettes. He has a 26.00 pack-year smoking history. He has never used smokeless tobacco. He reports current alcohol use. He reports previous drug use. Drug: Cocaine.  Family History: Family History  Problem Relation Age of Onset  . Cancer Father   . Diabetes Mellitus II Sister     Physical Exam: Vitals:   03/03/20 2041 03/03/20 2155 03/03/20 2159 03/04/20 0015  BP: (!) 190/115  (!) 170/127 (!) 187/127  Pulse: (!) 116  88 77  Resp: (!) 23  (!) 21 (!) 22  Temp:      TempSrc:  SpO2: (!) 88% 100% 95% 90%    General: Sleepy but arousable male, no acute distress Eyes: PERRLA, pink conjunctiva, no scleral icterus ENT: Moist oral mucosa, neck supple, no thyromegaly Lungs: clear to ascultation, no wheeze, no crackles, no use of accessory muscles Cardiovascular: regular rate and rhythm, no regurgitation, no gallops, no murmurs. No carotid bruits, no JVD Abdomen: soft, positive BS, non-tender, non-distended, no organomegaly, not an acute abdomen GU: not examined Neuro: CN II - XII grossly intact, sensation intact Musculoskeletal: strength 5/5 all extremities, no clubbing, cyanosis or edema Skin: no rash, no subcutaneous crepitation, no decubitus Psych: Sleepy but arousable patient   Labs on Admission:  Recent  Labs    03/03/20 2121  NA 144  K 3.9  CL 108  CO2 26  GLUCOSE 105*  BUN 8  CREATININE 0.99  CALCIUM 9.0  MG 1.9   Recent Labs    03/03/20 2121  AST 22  ALT 15  ALKPHOS 65  BILITOT 0.7  PROT 7.6  ALBUMIN 4.1   No results for input(s): LIPASE, AMYLASE in the last 72 hours. Recent Labs    03/03/20 2121  WBC 9.2  NEUTROABS 5.8  HGB 17.5*  HCT 53.1*  MCV 94.3  PLT 211   No results for input(s): CKTOTAL, CKMB, CKMBINDEX, TROPONINI in the last 72 hours. Invalid input(s): POCBNP No results for input(s): DDIMER in the last 72 hours. No results for input(s): HGBA1C in the last 72 hours. No results for input(s): CHOL, HDL, LDLCALC, TRIG, CHOLHDL, LDLDIRECT in the last 72 hours. No results for input(s): TSH, T4TOTAL, T3FREE, THYROIDAB in the last 72 hours.  Invalid input(s): FREET3 No results for input(s): VITAMINB12, FOLATE, FERRITIN, TIBC, IRON, RETICCTPCT in the last 72 hours.  Micro Results: No results found for this or any previous visit (from the past 240 hour(s)).   Radiological Exams on Admission: CT Head Wo Contrast  Result Date: 03/03/2020 CLINICAL DATA:  Seizure EXAM: CT HEAD WITHOUT CONTRAST TECHNIQUE: Contiguous axial images were obtained from the base of the skull through the vertex without intravenous contrast. 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 Diffuse chronic small vessel disease throughout the deep white matter, age advanced. No acute intracranial abnormality. Specifically, no hemorrhage, hydrocephalus, mass lesion, acute infarction, or significant intracranial injury. Vascular: No hyperdense vessel or unexpected calcification. Skull: No acute calvarial abnormality. Sinuses/Orbits: Mucoperiosteal thickening in the left maxillary sinus. No acute findings. Other: None IMPRESSION: Old left frontal infarct. Extensive chronic small vessel disease. No acute intracranial abnormality. Electronically Signed   By:  Rolm Baptise M.D.   On: 03/03/2020 20:52   DG Chest Portable 1 View  Result Date: 03/03/2020 CLINICAL DATA:  Hypoxia EXAM: PORTABLE CHEST 1 VIEW COMPARISON:  05/05/2019 FINDINGS: The heart size and mediastinal contours are within normal limits. Both lungs are clear. The visualized skeletal structures are unremarkable. IMPRESSION: Negative. Electronically Signed   By: Rolm Baptise M.D.   On: 03/03/2020 20:28   DG Hand Complete Left  Result Date: 03/03/2020 CLINICAL DATA:  Punched wall EXAM: LEFT HAND - COMPLETE 3+ VIEW COMPARISON:  None. FINDINGS: There is no evidence of fracture or dislocation. There is no evidence of arthropathy or other focal bone abnormality. Soft tissues are unremarkable. IMPRESSION: Negative. Electronically Signed   By: Rolm Baptise M.D.   On: 03/03/2020 19:58    Assessment/Plan Present on Admission: . Alcohol abuse with intoxication ([place]) -Admit to med telemetry -CIWA protocol ordered  . Acute respiratory failure  with hypoxemia ([place]) -unclear cause of patient's hypoxemia question could be due to polypharmacy including what he has drinking and meds given in the the ER.  Could also be due to aspiration pneumonia when patient sees.  Patient chest x-ray is essentially clear, CTA chest has not been done.   -DuoNebs, respiratory to evaluate and treat -IV antibiotics are currently for aspiration pneumonia  Possible seizures -Likely alcohol withdrawal seizures.  Home meds resumed -Add CK and lactic acid level -Continue p.o. Dilantin  . Altered mental status -May be due to intoxication.  Ammonia, TSH, vitamin W92 and folic acid has been ordered -Thiamine and folate have been ordered for the patient on the floor -Reevaluate in a.m. -CT head normal  . Essential hypertension, uncontrolled  -home meds resumed -PRN blood pressure meds ordered  Tobacco abuse -Nicotine patch ordered -UDS ordered   Raedyn Wenke 03/04/2020, 12:41 AM

## 2020-03-06 NOTE — Telephone Encounter (Signed)
Spoke with patient. He is scheduled with Derl Barrow NP on Monday 03/11/20 Nothing further needed at this time.

## 2020-03-08 ENCOUNTER — Telehealth: Payer: Self-pay

## 2020-03-08 NOTE — Telephone Encounter (Signed)
-----   Message from Julian Hy, DO sent at 03/05/2020  6:26 PM EDT ----- I reviewed the CT scan and think this likely needs a navigation bronch. I am happy to see him, but if [facility] or [facility] have room in their schedules at any point they may be the ones doing his procedure.   LPC ----- Message ----- From: Juanito Doom, MD Sent: 03/04/2020   9:27 AM EDT To: Collene Gobble, MD, Garner Nash, DO, #

## 2020-03-08 NOTE — Telephone Encounter (Signed)
I apologize, I'm only here in clinic on Mondays and Fridays and am just now seeing this message.  I see where pt is scheduled to see Derl Barrow on 3/29- Dr. Carlis Abbott please advise if navigational bronch needs to be scheduled prior to this appt, or can be scheduled at 3/29 appt.  Thanks!   (also routing to Lauren per office protocol to ensure scheduling of bronch as necessary)

## 2020-03-08 NOTE — Telephone Encounter (Signed)
No that is fine, he can see her first and go through the scans and the need for procedure and it can be set up after that visit.

## 2020-03-09 LAB — CULTURE, BLOOD (ROUTINE X 2)
Culture: NO GROWTH
Culture: NO GROWTH
Special Requests: ADEQUATE
Special Requests: ADEQUATE

## 2020-03-11 ENCOUNTER — Telehealth: Payer: Self-pay | Admitting: Primary Care

## 2020-03-11 ENCOUNTER — Inpatient Hospital Stay: Payer: Medicare HMO | Admitting: Primary Care

## 2020-03-11 NOTE — Telephone Encounter (Signed)
Noted. Patient is seeing BW at 1430 03/11/20. Nothing further needed

## 2020-03-11 NOTE — Telephone Encounter (Signed)
Today's visit needs to be changed to new consult for lung nodule. He has not been seen since 2016 in-patient. Please re-schedule with a pulmonologist this week or next. Dr. Loanne Drilling has an opening next Monday.

## 2020-04-05 NOTE — Telephone Encounter (Signed)
I called and spoke with the pt and have scheduled him with Dr Loanne Drilling for 04/09/20 at 9:30 am.

## 2020-04-09 ENCOUNTER — Ambulatory Visit (INDEPENDENT_AMBULATORY_CARE_PROVIDER_SITE_OTHER): Payer: Medicare HMO | Admitting: Pulmonary Disease

## 2020-04-09 ENCOUNTER — Other Ambulatory Visit: Payer: Self-pay

## 2020-04-09 ENCOUNTER — Encounter: Payer: Self-pay | Admitting: Pulmonary Disease

## 2020-04-09 ENCOUNTER — Telehealth: Payer: Self-pay | Admitting: Pulmonary Disease

## 2020-04-09 VITALS — BP 158/74 | HR 79 | Temp 97.8°F | Ht 69.0 in | Wt 194.0 lb

## 2020-04-09 DIAGNOSIS — R569 Unspecified convulsions: Secondary | ICD-10-CM

## 2020-04-09 DIAGNOSIS — R062 Wheezing: Secondary | ICD-10-CM

## 2020-04-09 DIAGNOSIS — R911 Solitary pulmonary nodule: Secondary | ICD-10-CM | POA: Diagnosis not present

## 2020-04-09 DIAGNOSIS — R918 Other nonspecific abnormal finding of lung field: Secondary | ICD-10-CM | POA: Insufficient documentation

## 2020-04-09 DIAGNOSIS — F17210 Nicotine dependence, cigarettes, uncomplicated: Secondary | ICD-10-CM

## 2020-04-09 MED ORDER — SPIRIVA RESPIMAT 2.5 MCG/ACT IN AERS: 2.0000 | INHALATION_SPRAY | Freq: Every day | RESPIRATORY_TRACT | 6 refills | Status: DC

## 2020-04-09 MED ORDER — SPIRIVA RESPIMAT 2.5 MCG/ACT IN AERS: 2.0000 | INHALATION_SPRAY | Freq: Every day | RESPIRATORY_TRACT | 0 refills | Status: DC

## 2020-04-09 NOTE — Progress Notes (Signed)
Subjective:   PATIENT ID: Richard [name] GENDER: male DOB: November 30, 1971, MRN: [id number]   HPI  Chief Complaint  Patient presents with  . Consult    SOB with activity, whezzing with activity     Reason for Visit: New consult for lung mass  [name] is 49 year old male current smoker with HTN, hx seizures, hx TIA, prior hx of RLE DVT >10 years ago (unclear if provoked) and alcohol abuse who presents for abnormal CT chest.  Reviewed discharge summary 03/04/20 by Dr. Bonner Puna: Admitted for alcohol intoxication. Found to be hypoxemic. CTA incidentally demonstrated lung mass in RUL and RLL concerning for malignancy. No formal Pulmonary consult placed however primary team asked Pulm to review images and patient was scheduled for outpatient follow-up since patient wished to leave AMA during that ED visit.  He reports at year-long history of shortness of breath, wheezing and unproductive cough that is unchanged. Worsens with exertion. Usually notices at the end of day or after moderate activity. He will have to rest for 45 minutes at times to recover. Does not use an inhalers.   Reports weight loss from 225lb last year and today is 194lb. Reports decreased appetite. Occasional fevers and chills that he can't explain. Denies night sweats.  Denies family history of asthma, cancer. Sister has DM2.  Social History: Active smoker. 1.5 ppd x 34 years  Environmental exposures: Architect, laying down asphalt, concrete work, Biomedical scientist, Nurse, adult  I have personally reviewed patient's past medical/family/social history, allergies, current medications.  Past Medical History:  Diagnosis Date  . Asthma   . Brain bleed ([place])   . Hypertension   . Seizures ([place])    last episode 03/2013     Family History  Problem Relation Age of Onset  . Cancer Father   . Diabetes Mellitus II Sister      Social History   Occupational History    Employer: OTHER    Comment: n/a  Tobacco Use  .  Smoking status: Current Every Day Smoker    Packs/day: 1.50    Years: 34.00    Pack years: 51.00    Types: Cigarettes    Start date: 71  . Smokeless tobacco: Never Used  . Tobacco comment: 04/09/20 12-14 cigs per day   Substance and Sexual Activity  . Alcohol use: Yes  . Drug use: Not Currently    Types: Cocaine    Comment: last use several years ago   . Sexual activity: Yes    Allergies  Allergen Reactions  . Dilaudid [Hydromorphone Hcl] Nausea Only and Other (See Comments)    Bradycardia and hyperthermia, too  . Tape Itching and Dermatitis     Outpatient Medications Prior to Visit  Medication Sig Dispense Refill  . Aspirin-Acetaminophen-Caffeine (GOODYS EXTRA STRENGTH) 500-325-65 MG PACK Take 1-3 packets by mouth daily as needed (for pain).     . cloNIDine (CATAPRES) 0.3 MG tablet Take 1 tablet (0.3 mg total) by mouth 2 (two) times daily. 60 tablet 0  . losartan (COZAAR) 25 MG tablet Take 1 tablet (25 mg total) by mouth daily. 30 tablet 0  . phenytoin (DILANTIN) 50 MG tablet Chew 1 tablet (50 mg total) by mouth 2 (two) times daily. 60 tablet 0  . vitamin B-12 (CYANOCOBALAMIN) 1000 MCG tablet Take 1 tablet (1,000 mcg total) by mouth daily. 30 tablet 0   No facility-administered medications prior to visit.    Review of Systems  Constitutional: Positive for chills, fever and weight  loss. Negative for diaphoresis and malaise/fatigue.  HENT: Negative for congestion, ear pain and sore throat.   Respiratory: Positive for cough, shortness of breath and wheezing. Negative for hemoptysis and sputum production.   Cardiovascular: Negative for chest pain, palpitations and leg swelling.  Gastrointestinal: Negative for abdominal pain, heartburn and nausea.  Genitourinary: Negative for frequency.  Musculoskeletal: Negative for joint pain and myalgias.  Skin: Negative for itching and rash.  Neurological: Negative for dizziness, weakness and headaches.  Endo/Heme/Allergies: Does not  bruise/bleed easily.  Psychiatric/Behavioral: Negative for depression. The patient is not nervous/anxious.     Objective:   Vitals:   04/09/20 0907  BP: (!) 158/74  Pulse: 79  Temp: 97.8 F (36.6 C)  TempSrc: Temporal  SpO2: 96%  Weight: 194 lb (88 kg)  Height: 5\' 9"  (1.753 m)   SpO2: 96 % O2 Device: None (Room air)  Physical Exam: General: Well-appearing, no acute distress HENT: [place], AT Eyes: EOMI, no scleral icterus Respiratory: Clear to auscultation bilaterally.  No crackles, wheezing or rales Cardiovascular: RRR, -M/R/G, no JVD GI: BS+, soft, nontender Extremities:-Edema,-tenderness Neuro: AAO x4, CNII-XII grossly intact Skin: Intact, no rashes or bruising Psych: Normal mood, normal affect  Data Reviewed:  Imaging: CTA 03/04/20 - No pulmonary embolus. 2.6cm mass in superior segment of the RLL. RUL nodule measured at 1.2 cm. Background emphysema.  PFT: None on file  Assessment & Plan:   Discussion: 49 year old male active smoker with recent weight loss and loss of appetite who presents with incidental lung masses concerning for malignancy. Reviewed CT imaging and discussed diagnostic testing vs surveillance. Given characteristic of lung masses, I recommend tissue sampling. We discussed risks and benefits of bronchoscopic evaluation. Will need imaging with PET scan first. Discussed case with Dr. Valeta Harms who will see patient after imaging and will discuss further diagnostic testing based on findings at that time. I addressed patient's questions regarding plan as noted below. We also discussed smoking cessation and starting bronchodilators for his emphysema.  Right sided lung masses in RUL and RLL --PET scan ordered --Will arrange for follow-up with Dr. Valeta Harms to discuss PET scan and potential bronchoscopy. I encourage you to bring any family support to this appointment to discuss results and address questions for future procedures.  Emphysema --START Spiriva 2.5 mcg TWO  puffs once a day  Tobacco abuse Patient is an active smoker. We discussed smoking cessation for 8 minutes. We discussed triggers and stressors and ways to deal with them. We discussed barriers to continued smoking and benefits of smoking cessation. Provided patient with information cessation techniques and interventions including [place] quitline. --Schedule for smoking cessation with Pharmacy team --Referred to IM for management of other medical problems including hx seizure, HTN, alcohol use. May benefit from behavioral health referral as well  Health Maintenance Immunization History  Administered Date(s) Administered  . Influenza Whole 11/13/2010, 09/04/2019  . Pneumococcal Polysaccharide-23 02/15/2015   CT Lung Screen - not qualified  Orders Placed This Encounter  Procedures  . NM PET Image Initial (PI) Skull Base To Thigh    Needs to be scheduled ASAP    Standing Status:   Future    Standing Expiration Date:   06/09/2021    Order Specific Question:   If indicated for the ordered procedure, I authorize the administration of a radiopharmaceutical per Radiology protocol    Answer:   Yes    Order Specific Question:   Radiology Contrast Protocol - do NOT remove file path  Answer:   \\[email or web address]  No orders of the defined types were placed in this encounter.   Return for after PET scan with Dr. Valeta Harms.  I have spent a total time of 60-minutes on the day of the appointment reviewing prior documentation, coordinating care and discussing medical diagnosis and plan with the patient/family. Imaging, labs and tests included in this note have been reviewed and interpreted independently by me.  Pierre, MD [facility] 04/09/2020 9:55 AM  Office Number [phone]

## 2020-04-09 NOTE — Progress Notes (Signed)
Patient seen in the office today and instructed on use of spiriva respimat .  Patient expressed understanding and demonstrated technique.

## 2020-04-09 NOTE — Telephone Encounter (Signed)
Spoke with pt. He has been scheduled to see Dr. Valeta Harms on 04/22/2020 at 1430. Nothing further was needed.

## 2020-04-09 NOTE — Telephone Encounter (Signed)
Patient seen by JE today 04/09/20  Phone call from [place] to [facility] to see if they have any information ATC number listed as call back and it was a fax number

## 2020-04-09 NOTE — Patient Instructions (Addendum)
Right-sided lung masses --PET scan ordered --Will arrange for follow-up with Dr. Valeta Harms to discuss PET scan and potential bronchoscopy. I encourage you to bring any family support to this appointment to discuss results and address questions for future procedures.  Emphysema --START Spiriva 2.5 mcg TWO puffs once a day  Tobacco abuse --Schedule for smoking cessation with Pharmacy team    Front desk: Patient will need urgent follow-up with Dr. Valeta Harms after PET scan. Please schedule in next available 30 minute slot.

## 2020-04-10 ENCOUNTER — Telehealth: Payer: Self-pay | Admitting: Pulmonary Disease

## 2020-04-10 NOTE — Telephone Encounter (Signed)
I faxed records yesterday.  Called [facility] and spoke to Winston.  Fax was down yesterday.  Gave her clinical info & PET has been approved.  Nothing further needed.

## 2020-04-10 NOTE — Telephone Encounter (Signed)
PCCM:  I called the patient to discuss his recent CT imaging and consultation with Dr. Loanne Drilling.  I was hoping to encourage the patient for consideration of tissue diagnosis and biopsy of the abnormalities within his CT scan.  Hoping to make some arrangements to plan for this to immediately follow his pet imaging and after we have a chance to talk about it in the office on the 10th.  Patient went off into a very long winded somewhat irate back-and-forth discussion about consenting for procedure and how that it was our responsibility to ensure nothing bad would happen to him and that if something bad did happen to him that it was our responsibility as a healthcare system in a nation to make sure his grandbabies are taken care of.  At this point it was very clear that there would need much further discussion to include risk benefits and alternatives of potential procedural needs for his abnormalities within the chest cavity.  I did describe to him that I was concerned by the CT imaging of potential underlying lung cancer diagnosis.  Therefore, patient will follow up in clinic with me on the 10th and we can discuss further.   Garner Nash, DO [facility] 04/10/2020 4:41 PM

## 2020-04-12 ENCOUNTER — Other Ambulatory Visit: Payer: Self-pay

## 2020-04-12 ENCOUNTER — Encounter (HOSPITAL_COMMUNITY): Payer: Medicare HMO

## 2020-04-15 ENCOUNTER — Other Ambulatory Visit: Payer: Self-pay

## 2020-04-15 ENCOUNTER — Ambulatory Visit (HOSPITAL_COMMUNITY): Payer: Medicare HMO

## 2020-04-15 ENCOUNTER — Ambulatory Visit (HOSPITAL_COMMUNITY)
Admission: RE | Admit: 2020-04-15 | Discharge: 2020-04-15 | Disposition: A | Discharge status: Home / Self Care | Payer: Medicare HMO | Source: Ambulatory Visit | Attending: Pulmonary Disease | Admitting: Pulmonary Disease

## 2020-04-15 DIAGNOSIS — J439 Emphysema, unspecified: Secondary | ICD-10-CM | POA: Diagnosis not present

## 2020-04-15 DIAGNOSIS — I712 Thoracic aortic aneurysm, without rupture: Secondary | ICD-10-CM | POA: Insufficient documentation

## 2020-04-15 DIAGNOSIS — D3502 Benign neoplasm of left adrenal gland: Secondary | ICD-10-CM | POA: Insufficient documentation

## 2020-04-15 DIAGNOSIS — Z79899 Other long term (current) drug therapy: Secondary | ICD-10-CM | POA: Insufficient documentation

## 2020-04-15 DIAGNOSIS — F17210 Nicotine dependence, cigarettes, uncomplicated: Secondary | ICD-10-CM | POA: Diagnosis not present

## 2020-04-15 DIAGNOSIS — R911 Solitary pulmonary nodule: Secondary | ICD-10-CM

## 2020-04-15 DIAGNOSIS — N433 Hydrocele, unspecified: Secondary | ICD-10-CM | POA: Diagnosis not present

## 2020-04-15 DIAGNOSIS — R918 Other nonspecific abnormal finding of lung field: Secondary | ICD-10-CM | POA: Diagnosis not present

## 2020-04-15 LAB — GLUCOSE, CAPILLARY: Glucose-Capillary: 120 mg/dL — ABNORMAL HIGH (ref 70–99)

## 2020-04-15 MED ORDER — FLUDEOXYGLUCOSE F - 18 (FDG) INJECTION
9.7400 | Freq: Once | INTRAVENOUS | Status: AC | PRN
  Administered 2020-04-15: 9.74 via INTRAVENOUS

## 2020-04-22 ENCOUNTER — Institutional Professional Consult (permissible substitution): Payer: Medicare HMO | Admitting: Pulmonary Disease

## 2020-04-22 NOTE — Progress Notes (Deleted)
Synopsis: Referred in May 2021 for this by No ref. provider found  Subjective:   PATIENT ID: Richard [name] GENDER: male DOB: May 08, 1971, MRN: [id number]  No chief complaint on file.   49 year old gentleman history of hypertension, asthma.  Patient had a recent hospitalization on 03/03/2020.  Patient had seizure-like episode, intoxication and agitation given Versed and Geodon in the emergency department.  Felt to have concern for withdrawal seizures from alcohol use.  Longstanding history of tobacco abuse.  Patient unfortunately left AMA without evaluation of a lung mass.  Pulmonary was called about this during his hospitalization in March.  Patient was discharged from the hospital on 03/04/2020, discharge summary by Dr. Bonner Puna reviewed.  Multiple telephone calls documented within the chart to encourage hospital follow-up.  Patient was eventually scheduled to see Dr. Loanne Drilling.  Patient saw Dr. Loanne Drilling on 04/09/2020.  Current smoker 1.5 packs/day for 34 years.  Patient found to have a right-sided lung mass within the right upper lobe and right lower lobe.  Recommending evaluation for bronchoscopy.  PET scan was ordered which was completed on 04/15/2020.  Patient here today for follow-up after nuclear medicine pet imaging.  This was concerning for 2 hypermetabolic lesions within the right side.  Concerning for synchronous primaries.   ***  Past Medical History:  Diagnosis Date  . Asthma   . Brain bleed ([place])   . Hypertension   . Seizures ([place])    last episode 03/2013     Family History  Problem Relation Age of Onset  . Cancer Father   . Diabetes Mellitus II Sister      Past Surgical History:  Procedure Laterality Date  . LEG SURGERY      Social History   Socioeconomic History  . Marital status: Married    Spouse name: Lattie Haw  . Number of children: 1  . Years of education: HS  . Highest education level: Not on file  Occupational History    Employer: OTHER    Comment: n/a  Tobacco Use    . Smoking status: Current Every Day Smoker    Packs/day: 1.50    Years: 34.00    Pack years: 51.00    Types: Cigarettes    Start date: 36  . Smokeless tobacco: Never Used  . Tobacco comment: 04/09/20 12-14 cigs per day   Substance and Sexual Activity  . Alcohol use: Yes  . Drug use: Not Currently    Types: Cocaine    Comment: last use several years ago   . Sexual activity: Yes  Other Topics Concern  . Not on file  Social History Narrative   Patient lives at home alone.   Caffeine Use: 4-5 cups daily   Social Determinants of Health   Financial Resource Strain:   . Difficulty of Paying Living Expenses:   Food Insecurity:   . Worried About Charity fundraiser in the Last Year:   . Arboriculturist in the Last Year:   Transportation Needs:   . Film/video editor (Medical):   Marland Kitchen Lack of Transportation (Non-Medical):   Physical Activity:   . Days of Exercise per Week:   . Minutes of Exercise per Session:   Stress:   . Feeling of Stress :   Social Connections:   . Frequency of Communication with Friends and Family:   . Frequency of Social Gatherings with Friends and Family:   . Attends Religious Services:   . Active Member of Clubs or Organizations:   .  Attends Archivist Meetings:   Marland Kitchen Marital Status:   Intimate Partner Violence:   . Fear of Current or Ex-Partner:   . Emotionally Abused:   Marland Kitchen Physically Abused:   . Sexually Abused:      Allergies  Allergen Reactions  . Dilaudid [Hydromorphone Hcl] Nausea Only and Other (See Comments)    Bradycardia and hyperthermia, too  . Tape Itching and Dermatitis     Outpatient Medications Prior to Visit  Medication Sig Dispense Refill  . Aspirin-Acetaminophen-Caffeine (GOODYS EXTRA STRENGTH) 500-325-65 MG PACK Take 1-3 packets by mouth daily as needed (for pain).     . cloNIDine (CATAPRES) 0.3 MG tablet Take 1 tablet (0.3 mg total) by mouth 2 (two) times daily. 60 tablet 0  . losartan (COZAAR) 25 MG tablet Take  1 tablet (25 mg total) by mouth daily. 30 tablet 0  . phenytoin (DILANTIN) 50 MG tablet Chew 1 tablet (50 mg total) by mouth 2 (two) times daily. 60 tablet 0  . Tiotropium Bromide Monohydrate (SPIRIVA RESPIMAT) 2.5 MCG/ACT AERS Inhale 2 puffs into the lungs daily. 4 g 6  . Tiotropium Bromide Monohydrate (SPIRIVA RESPIMAT) 2.5 MCG/ACT AERS Inhale 2 puffs into the lungs daily. 4 g 0  . vitamin B-12 (CYANOCOBALAMIN) 1000 MCG tablet Take 1 tablet (1,000 mcg total) by mouth daily. 30 tablet 0   No facility-administered medications prior to visit.    ROS   Objective:  Physical Exam   There were no vitals filed for this visit.   on *** LPM *** RA BMI Readings from Last 3 Encounters:  04/09/20 28.65 kg/m  11/05/19 33.21 kg/m  11/03/19 33.23 kg/m   Wt Readings from Last 3 Encounters:  04/09/20 194 lb (88 kg)  11/05/19 224 lb 13.9 oz (102 kg)  11/03/19 225 lb (102.1 kg)     CBC    Component Value Date/Time   WBC 10.5 03/04/2020 0555   RBC 5.64 03/04/2020 0555   HGB 17.3 (H) 03/04/2020 0555   HCT 54.8 (H) 03/04/2020 0555   PLT 200 03/04/2020 0555   MCV 97.2 03/04/2020 0555   MCH 30.7 03/04/2020 0555   MCHC 31.6 03/04/2020 0555   RDW 15.6 (H) 03/04/2020 0555   LYMPHSABS 2.4 03/03/2020 2121   MONOABS 0.9 03/03/2020 2121   EOSABS 0.1 03/03/2020 2121   BASOSABS 0.0 03/03/2020 2121    ***  Chest Imaging: ***  Pulmonary Functions Testing Results: No flowsheet data found.  FeNO: ***  Pathology: ***  Echocardiogram: ***  Heart Catheterization: ***    Assessment & Plan:   No diagnosis found.  Discussion: ***   Current Outpatient Medications:  .  Aspirin-Acetaminophen-Caffeine (GOODYS EXTRA STRENGTH) 500-325-65 MG PACK, Take 1-3 packets by mouth daily as needed (for pain). , Disp: , Rfl:  .  cloNIDine (CATAPRES) 0.3 MG tablet, Take 1 tablet (0.3 mg total) by mouth 2 (two) times daily., Disp: 60 tablet, Rfl: 0 .  losartan (COZAAR) 25 MG tablet, Take 1 tablet  (25 mg total) by mouth daily., Disp: 30 tablet, Rfl: 0 .  phenytoin (DILANTIN) 50 MG tablet, Chew 1 tablet (50 mg total) by mouth 2 (two) times daily., Disp: 60 tablet, Rfl: 0 .  Tiotropium Bromide Monohydrate (SPIRIVA RESPIMAT) 2.5 MCG/ACT AERS, Inhale 2 puffs into the lungs daily., Disp: 4 g, Rfl: 6 .  Tiotropium Bromide Monohydrate (SPIRIVA RESPIMAT) 2.5 MCG/ACT AERS, Inhale 2 puffs into the lungs daily., Disp: 4 g, Rfl: 0 .  vitamin B-12 (CYANOCOBALAMIN) 1000 MCG tablet,  Take 1 tablet (1,000 mcg total) by mouth daily., Disp: 30 tablet, Rfl: 0   Garner Nash, DO [facility] 04/22/2020 2:05 PM

## 2020-04-26 NOTE — Progress Notes (Signed)
I reviewed results and discussed findings with Dr. Valeta Harms. Will need to arrange navigational bronchoscopy for two PET-avid right sided lung lesions. Will also need PFTs.  Staff, please STAT appointment with Dr. Valeta Harms or Dr. [name] to discuss and arrange for EMN.   Lauren, please send certified letter to patient with PET results and requesting patient to contact office ASAP for appointment with the following message: "Mr. Harnois, please contact our office as soon as possible to schedule an appointment. I have reviewed your PET scan and recommend scheduling a biopsy for spots in your lung that have been identified as abnormal and are concerning for possible cancer. "  JE

## 2020-05-09 ENCOUNTER — Emergency Department (HOSPITAL_COMMUNITY): Payer: Medicare HMO

## 2020-05-09 ENCOUNTER — Emergency Department (HOSPITAL_COMMUNITY)
Admission: EM | Admit: 2020-05-09 | Discharge: 2020-05-09 | Disposition: A | Discharge status: Home / Self Care | Payer: Medicare HMO | Attending: Emergency Medicine | Admitting: Emergency Medicine

## 2020-05-09 DIAGNOSIS — R55 Syncope and collapse: Secondary | ICD-10-CM

## 2020-05-09 DIAGNOSIS — J45909 Unspecified asthma, uncomplicated: Secondary | ICD-10-CM | POA: Insufficient documentation

## 2020-05-09 DIAGNOSIS — R404 Transient alteration of awareness: Secondary | ICD-10-CM | POA: Diagnosis not present

## 2020-05-09 DIAGNOSIS — F17210 Nicotine dependence, cigarettes, uncomplicated: Secondary | ICD-10-CM | POA: Insufficient documentation

## 2020-05-09 DIAGNOSIS — Z79899 Other long term (current) drug therapy: Secondary | ICD-10-CM | POA: Diagnosis not present

## 2020-05-09 DIAGNOSIS — R0789 Other chest pain: Secondary | ICD-10-CM | POA: Diagnosis not present

## 2020-05-09 DIAGNOSIS — I10 Essential (primary) hypertension: Secondary | ICD-10-CM | POA: Diagnosis not present

## 2020-05-09 DIAGNOSIS — R079 Chest pain, unspecified: Secondary | ICD-10-CM | POA: Diagnosis not present

## 2020-05-09 DIAGNOSIS — R0902 Hypoxemia: Secondary | ICD-10-CM | POA: Diagnosis not present

## 2020-05-09 LAB — URINALYSIS, ROUTINE W REFLEX MICROSCOPIC
Bilirubin Urine: NEGATIVE
Glucose, UA: NEGATIVE mg/dL
Hgb urine dipstick: NEGATIVE
Ketones, ur: NEGATIVE mg/dL
Leukocytes,Ua: NEGATIVE
Nitrite: NEGATIVE
Protein, ur: NEGATIVE mg/dL
Specific Gravity, Urine: 1.003 — ABNORMAL LOW (ref 1.005–1.030)
pH: 5 (ref 5.0–8.0)

## 2020-05-09 LAB — COMPREHENSIVE METABOLIC PANEL
ALT: 22 U/L (ref 0–44)
AST: 33 U/L (ref 15–41)
Albumin: 4 g/dL (ref 3.5–5.0)
Alkaline Phosphatase: 68 U/L (ref 38–126)
Anion gap: 12 (ref 5–15)
BUN: 7 mg/dL (ref 6–20)
CO2: 23 mmol/L (ref 22–32)
Calcium: 9 mg/dL (ref 8.9–10.3)
Chloride: 105 mmol/L (ref 98–111)
Creatinine, Ser: 1.03 mg/dL (ref 0.61–1.24)
GFR calc Af Amer: >60 mL/min (ref >60)
GFR calc non Af Amer: >60 mL/min (ref >60)
Glucose, Bld: 98 mg/dL (ref 70–99)
Potassium: 5.8 mmol/L — ABNORMAL HIGH (ref 3.5–5.1)
Sodium: 140 mmol/L (ref 135–145)
Total Bilirubin: 1.1 mg/dL (ref 0.3–1.2)

## 2020-05-09 LAB — PHENYTOIN LEVEL, TOTAL: Phenytoin Lvl: <2.5 ug/mL — ABNORMAL LOW (ref 10.0–20.0)

## 2020-05-09 LAB — TROPONIN I (HIGH SENSITIVITY): Troponin I (High Sensitivity): 8 ng/L (ref <18)

## 2020-05-09 LAB — RAPID URINE DRUG SCREEN, HOSP PERFORMED
Amphetamines: NOT DETECTED
Barbiturates: NOT DETECTED
Benzodiazepines: NOT DETECTED
Cocaine: POSITIVE — AB
Opiates: NOT DETECTED
Tetrahydrocannabinol: NOT DETECTED

## 2020-05-09 LAB — AMMONIA: Ammonia: 48 umol/L — ABNORMAL HIGH (ref 9–35)

## 2020-05-09 LAB — ETHANOL: Alcohol, Ethyl (B): 141 mg/dL — ABNORMAL HIGH (ref <10)

## 2020-05-09 LAB — ACETAMINOPHEN LEVEL: Acetaminophen (Tylenol), Serum: <10 ug/mL — ABNORMAL LOW (ref 10–30)

## 2020-05-09 LAB — SALICYLATE LEVEL: Salicylate Lvl: <7 mg/dL — ABNORMAL LOW (ref 7.0–30.0)

## 2020-05-09 LAB — CK: Total CK: 134 U/L (ref 49–397)

## 2020-05-09 NOTE — ED Notes (Signed)
Pt requesting to leave AMA; pt spoke with EDP Wickline and this RN regarding him leaving AMA. Pt understands risks of leaving AMA and is still requesting to leave. EDP Wickline to facilitate process. Pt signed AMA form and is now leaving AMA. VS updated.

## 2020-05-09 NOTE — ED Provider Notes (Signed)
[facility]ENC[facility] DEPARTMENT Provider Note   CSN: [id number] Arrival date & time: 05/09/20  0044     History Chief Complaint  Patient presents with  . Loss of Consciousness    Richard [name] is a 49 y.o. male.  The history is provided by the patient.  Loss of Consciousness Episode history:  Multiple Most recent episode:  Today Timing:  Intermittent Progression:  Improving Chronicity:  New Witnessed: yes   Relieved by:  None tried Worsened by:  Nothing Associated symptoms: chest pain and malaise/fatigue   Associated symptoms: no seizures    Patient with history of seizures, asthma, hypertension, lung mass, alcohol abuse, drug abuse presents with syncopal episodes. Patient arrives via EMS. EMS was called out due to syncope. On their arrival patient was unconscious but then woke up. However in route he also had further syncopal episodes. His vitals remained normal during that time. No seizure activity was reported  Patient is now awake and alert and is requesting discharge home.    Past Medical History:  Diagnosis Date  . Asthma   . Brain bleed ([place])   . Hypertension   . Seizures ([place])    last episode 03/2013    Patient Active Problem List   Diagnosis Date Noted  . Right lower lobe lung mass 04/09/2020  . Right upper lobe pulmonary nodule 04/09/2020  . Alcohol abuse with intoxication ([place]) 03/04/2020  . Hypoxia 03/04/2020  . Swollen testicle   . Atelectasis   . Syncope 08/26/2015  . Acute encephalopathy 08/26/2015  . Seizure ([place])   . Acute pulmonary edema (HCC)   . Lower leg DVT (deep venous thromboembolism), acute ([place])   . Protein-calorie malnutrition, severe ([place]) 02/19/2015  . Bacteremia due to Staphylococcus 02/17/2015  . Alcohol withdrawal delirium ([place])   . Acute respiratory failure with hypoxia ([place]) 02/13/2015  . Acute respiratory failure with hypoxemia ([place])   . Altered mental status   . Metabolic acidosis   . Essential  hypertension   . Encephalopathy 02/11/2015  . Alcohol intoxication ([place]) 02/11/2015  . Lactic acidosis 02/11/2015  . Bleeding from the nose 03/19/2014  . TIA (transient ischemic attack) 12/24/2013  . Weakness 12/23/2013  . Left-sided weakness 12/23/2013  . Malignant hypertension 12/23/2013  . Seizure disorder ([place]) 12/23/2013  . Vomiting 06/18/2013  . Chest pain 06/18/2013  . Hypertensive urgency 10/30/2012  . Migraine variant 10/30/2012  . TOBACCO ABUSE 01/11/2008  . HYPERTENSION, BENIGN 11/24/2007  . CHRONIC FRONTAL SINUSITIS 11/24/2007  . Convulsions ([place]) 11/24/2007  . Headache(784.0) 11/24/2007    Past Surgical History:  Procedure Laterality Date  . LEG SURGERY         Family History  Problem Relation Age of Onset  . Cancer Father   . Diabetes Mellitus II Sister     Social History   Tobacco Use  . Smoking status: Current Every Day Smoker    Packs/day: 1.50    Years: 34.00    Pack years: 51.00    Types: Cigarettes    Start date: 7  . Smokeless tobacco: Never Used  . Tobacco comment: 04/09/20 12-14 cigs per day   Substance Use Topics  . Alcohol use: Yes  . Drug use: Not Currently    Types: Cocaine    Comment: last use several years ago     Home Medications Prior to Admission medications   Medication Sig Start Date End Date Taking? Authorizing Provider  Aspirin-Acetaminophen-Caffeine (GOODYS EXTRA STRENGTH) [phone] MG PACK Take 1-3 packets  by mouth daily as needed (for pain).     [provider]  cloNIDine (CATAPRES) 0.3 MG tablet Take 1 tablet (0.3 mg total) by mouth 2 (two) times daily. 03/04/20   Patrecia Pour, MD  losartan (COZAAR) 25 MG tablet Take 1 tablet (25 mg total) by mouth daily. 05/05/19   Carmin Muskrat, MD  phenytoin (DILANTIN) 50 MG tablet Chew 1 tablet (50 mg total) by mouth 2 (two) times daily. 03/04/20   Patrecia Pour, MD  Tiotropium Bromide Monohydrate (SPIRIVA RESPIMAT) 2.5 MCG/ACT AERS Inhale 2 puffs into the lungs daily.  04/09/20   Margaretha Seeds, MD  Tiotropium Bromide Monohydrate (SPIRIVA RESPIMAT) 2.5 MCG/ACT AERS Inhale 2 puffs into the lungs daily. 04/09/20   Margaretha Seeds, MD  vitamin B-12 (CYANOCOBALAMIN) 1000 MCG tablet Take 1 tablet (1,000 mcg total) by mouth daily. 03/04/20   Patrecia Pour, MD    Allergies    Dilaudid [hydromorphone hcl] and Tape  Review of Systems   Review of Systems  Constitutional: Positive for fatigue and malaise/fatigue.  Cardiovascular: Positive for chest pain and syncope.  Neurological: Positive for syncope. Negative for seizures.  All other systems reviewed and are negative.   Physical Exam Updated Vital Signs BP (!) 136/98 (BP Location: Right Arm)   Pulse 96   Temp 98.3 F (36.8 C) (Oral)   Resp 16   Ht 1.829 m (6')   Wt 88 kg   SpO2 (!) 88%   BMI 26.31 kg/m   Physical Exam CONSTITUTIONAL: Disheveled, mildly agitated HEAD: Normocephalic/atraumatic EYES: EOMI/PERRL, no nystagmus ENMT: Poor dentition, no tongue lacerations NECK: supple no meningeal signs SPINE/BACK:entire spine nontender CV: S1/S2 noted, no murmurs/rubs/gallops noted LUNGS: Lungs are clear to auscultation bilaterally, no apparent distress ABDOMEN: soft NEURO: Pt is awake/alert/appropriate, moves all extremitiesx4.  No facial droop. Patient is ambulatory EXTREMITIES: pulses normal/equal, full ROM, no visible trauma SKIN: warm, color normal PSYCH: Anxious and mildly agitated  ED Results / Procedures / Treatments   Labs (all labs ordered are listed, but only abnormal results are displayed) Labs Reviewed  COMPREHENSIVE METABOLIC PANEL - Abnormal; Notable for the following components:      Result Value   Potassium 5.8 (*)    Total Protein >12.0 (*)    All other components within normal limits  ACETAMINOPHEN LEVEL - Abnormal; Notable for the following components:   Acetaminophen (Tylenol), Serum <10 (*)    All other components within normal limits  AMMONIA - Abnormal; Notable for  the following components:   Ammonia 48 (*)    All other components within normal limits  ETHANOL - Abnormal; Notable for the following components:   Alcohol, Ethyl (B) 141 (*)    All other components within normal limits  SALICYLATE LEVEL - Abnormal; Notable for the following components:   Salicylate Lvl <7.8 (*)    All other components within normal limits  PHENYTOIN LEVEL, TOTAL - Abnormal; Notable for the following components:   Phenytoin Lvl <2.5 (*)    All other components within normal limits  RAPID URINE DRUG SCREEN, HOSP PERFORMED - Abnormal; Notable for the following components:   Cocaine POSITIVE (*)    All other components within normal limits  URINALYSIS, ROUTINE W REFLEX MICROSCOPIC - Abnormal; Notable for the following components:   Color, Urine STRAW (*)    Specific Gravity, Urine 1.003 (*)    All other components within normal limits  CK  CBC WITH DIFFERENTIAL/PLATELET  TROPONIN I (HIGH SENSITIVITY)  TROPONIN  I (HIGH SENSITIVITY)    EKG EKG Interpretation  Date/Time:  Thursday May 09 2020 01:04:26 EDT Ventricular Rate:  79 PR Interval:    QRS Duration: 100 QT Interval:  391 QTC Calculation: 449 R Axis:   57 Text Interpretation: Sinus rhythm Probable left atrial enlargement Left ventricular hypertrophy Anterior Q waves, possibly due to LVH No significant change since last tracing Confirmed by Ripley Fraise [phone]) on 05/09/2020 1:18:25 AM   Radiology DG Chest Port 1 View  Result Date: 05/09/2020 CLINICAL DATA:  Chest pain EXAM: PORTABLE CHEST 1 VIEW COMPARISON:  Radiograph 03/03/2020, CTA 03/04/2020, PET-CT 04/15/2020 FINDINGS: There is a right upper lobe spiculated pulmonary nodule. A larger nodule in the superior segment right lower lobe along the major fissure is less well visualized on this exam. No consolidation, features of edema, pneumothorax, or effusion. Mild central vascular congestion. Stable cardiomegaly. No acute osseous or soft tissue  abnormality. Telemetry leads overlie the chest. IMPRESSION: 1. Redemonstrated right upper lobe spiculated pulmonary nodule compatible with bronchogenic carcinoma. 2. Additional known pulmonary nodule in the superior segment right lower lobe is less well visualized on this exam. 3. Stable cardiomegaly and mild vascular congestion. No focal consolidation or convincing features of edema. Electronically Signed   By: Lovena Le M.D.   On: 05/09/2020 01:29    Procedures Procedures  Medications Ordered in ED Medications - No data to display  ED Course  I have reviewed the triage vital signs and the nursing notes.  Pertinent labs & imaging results that were available during my care of the patient were reviewed by me and considered in my medical decision making (see chart for details).    MDM Rules/Calculators/A&P                      Patient seen on arrival to the room. I discussed the case with the EMS team. They report patient had syncopal episodes on the way the ED, but his vitals remained appropriate and he woke up and be mildly agitated and improved. Glucose was normal He has a previous history of seizures but no seizure activity was reported tonight. Patient was mildly agitated when I first approached him. He was on his cell phone talking to family saying he'd rather go home and sleep. He did not feel that he needed to be in the emergency department. After further discussion, patient finally agreed to have laboratory evaluation and monitoring in the ER. Patient was given food at his request he ate his meal without any difficulty Patient kept discussing the fact that he was recently diagnosed with lung cancer. He felt due to this diagnosis he did need to have any further testing. I reassured him that we would like to explore causes of his syncope. Patient was agreeable with this initially. After monitoring in the ED, patient was requesting discharge home. I continue to discussed with patient the  need for further monitoring and lab evaluation. I advised her could be a serious condition that we could correct while he is in the emergency department. Patient understands this, but is still requesting discharge because he is tired. Patient reports he keeps "ripping and running" and knows that he needs to slow down. He feels he just needs a good night sleep. He does report he is motivated to have his lung cancer treated.  EKG was unchanged. While in the ED, there were no arrhythmias noted on his telemetry monitoring. Chest x-ray revealed lung mass but no other acute  findings, this was already known.  Labs were not completely resulted at time of discharge. Attempted to call his spouse via the numbers listed but one number went straight to voicemail, the other number reported voicemail was not active  I discussed risk of death/disability of leaving against medical advice and the patient accepts these risks.  The patient is awake/alert able to make decisions, and does not appear intoxicated. Patient discharged against medical advice.    4:46 AM Labs resulted several hours after patient left the ER.  Patient noted to have mild hyperkalemia.  Patient also noted to have alcohol and cocaine in his system. I called patient and left voicemail on his mobile phone to return to ER immediately as he will likely need IV fluids. Final Clinical Impression(s) / ED Diagnoses Final diagnoses:  Syncope and collapse    Rx / DC Orders ED Discharge Orders    None       Ripley Fraise, MD 05/09/20 [phone]

## 2020-05-09 NOTE — ED Notes (Signed)
Pt called out informing this EMT that he was ready to go home and wanted the MD back in ASAP. Lunette Stands, RN aware.

## 2020-05-09 NOTE — ED Notes (Signed)
Pt states that he does not want to be here and that he has cancer.

## 2020-05-09 NOTE — ED Triage Notes (Signed)
Pt arrived via [facility]. Pt was unconscious upon arrival. EMS stated that the Pt regained conscious on two occasions enroute to the hospital and then passed back out. EMS stated that when Pt regained consciousness, he attempted to answer questions but was not able to form words. EMS vitals WDl, CBG 160. Wife stated that Pt does have seizures but she did not witness one with him tonight. However, Pt stated that he passed out 4 times tonight.

## 2020-05-22 ENCOUNTER — Institutional Professional Consult (permissible substitution): Payer: Medicare HMO | Admitting: Pulmonary Disease

## 2020-05-23 ENCOUNTER — Institutional Professional Consult (permissible substitution): Payer: Medicare HMO | Admitting: Critical Care Medicine

## 2020-05-23 NOTE — Progress Notes (Deleted)
Synopsis: Referred in *** for *** by No ref. provider found.  Subjective:   PATIENT ID: Richard [name] GENDER: male DOB: 1971/08/29, MRN: [id number]  No chief complaint on file.   HPI  Former Hospital doctor patient Needs biopsy - Richard [name] had discussed over the phone    Past Medical History:  Diagnosis Date  . Asthma   . Brain bleed ([place])   . Hypertension   . Seizures ([place])    last episode 03/2013     Family History  Problem Relation Age of Onset  . Cancer Father   . Diabetes Mellitus II Sister      Past Surgical History:  Procedure Laterality Date  . LEG SURGERY      Social History   Socioeconomic History  . Marital status: Married    Spouse name: Lattie Haw  . Number of children: 1  . Years of education: HS  . Highest education level: Not on file  Occupational History    Employer: OTHER    Comment: n/a  Tobacco Use  . Smoking status: Current Every Day Smoker    Packs/day: 1.50    Years: 34.00    Pack years: 51.00    Types: Cigarettes    Start date: 50  . Smokeless tobacco: Never Used  . Tobacco comment: 04/09/20 12-14 cigs per day   Vaping Use  . Vaping Use: Never used  Substance and Sexual Activity  . Alcohol use: Yes  . Drug use: Not Currently    Types: Cocaine    Comment: last use several years ago   . Sexual activity: Yes  Other Topics Concern  . Not on file  Social History Narrative   Patient lives at home alone.   Caffeine Use: 4-5 cups daily   Social Determinants of Health   Financial Resource Strain:   . Difficulty of Paying Living Expenses:   Food Insecurity:   . Worried About Charity fundraiser in the Last Year:   . Arboriculturist in the Last Year:   Transportation Needs:   . Film/video editor (Medical):   Marland Kitchen Lack of Transportation (Non-Medical):   Physical Activity:   . Days of Exercise per Week:   . Minutes of Exercise per Session:   Stress:   . Feeling of Stress :   Social Connections:   . Frequency of Communication with  Friends and Family:   . Frequency of Social Gatherings with Friends and Family:   . Attends Religious Services:   . Active Member of Clubs or Organizations:   . Attends Archivist Meetings:   Marland Kitchen Marital Status:   Intimate Partner Violence:   . Fear of Current or Ex-Partner:   . Emotionally Abused:   Marland Kitchen Physically Abused:   . Sexually Abused:      Allergies  Allergen Reactions  . Dilaudid [Hydromorphone Hcl] Nausea Only and Other (See Comments)    Bradycardia and hyperthermia, too  . Tape Itching and Dermatitis     Immunization History  Administered Date(s) Administered  . Influenza Whole 11/13/2010, 09/04/2019  . Pneumococcal Polysaccharide-23 02/15/2015    Outpatient Medications Prior to Visit  Medication Sig Dispense Refill  . Aspirin-Acetaminophen-Caffeine (GOODYS EXTRA STRENGTH) 500-325-65 MG PACK Take 1-3 packets by mouth daily as needed (for pain).     . cloNIDine (CATAPRES) 0.3 MG tablet Take 1 tablet (0.3 mg total) by mouth 2 (two) times daily. 60 tablet 0  . losartan (COZAAR) 25 MG tablet Take 1 tablet (25  mg total) by mouth daily. 30 tablet 0  . phenytoin (DILANTIN) 50 MG tablet Chew 1 tablet (50 mg total) by mouth 2 (two) times daily. 60 tablet 0  . Tiotropium Bromide Monohydrate (SPIRIVA RESPIMAT) 2.5 MCG/ACT AERS Inhale 2 puffs into the lungs daily. 4 g 6  . Tiotropium Bromide Monohydrate (SPIRIVA RESPIMAT) 2.5 MCG/ACT AERS Inhale 2 puffs into the lungs daily. 4 g 0  . vitamin B-12 (CYANOCOBALAMIN) 1000 MCG tablet Take 1 tablet (1,000 mcg total) by mouth daily. 30 tablet 0   No facility-administered medications prior to visit.    ROS   Objective:  There were no vitals filed for this visit.   on *** LPM *** RA BMI Readings from Last 3 Encounters:  05/09/20 26.31 kg/m  04/09/20 28.65 kg/m  11/05/19 33.21 kg/m   Wt Readings from Last 3 Encounters:  05/09/20 194 lb (88 kg)  04/09/20 194 lb (88 kg)  11/05/19 224 lb 13.9 oz (102 kg)     Physical Exam   CBC    Component Value Date/Time   WBC 10.5 03/04/2020 0555   RBC 5.64 03/04/2020 0555   HGB 17.3 (H) 03/04/2020 0555   HCT 54.8 (H) 03/04/2020 0555   PLT 200 03/04/2020 0555   MCV 97.2 03/04/2020 0555   MCH 30.7 03/04/2020 0555   MCHC 31.6 03/04/2020 0555   RDW 15.6 (H) 03/04/2020 0555   LYMPHSABS 2.4 03/03/2020 2121   MONOABS 0.9 03/03/2020 2121   EOSABS 0.1 03/03/2020 2121   BASOSABS 0.0 03/03/2020 2121    CHEMISTRY No results for input(s): NA, K, CL, CO2, GLUCOSE, BUN, CREATININE, CALCIUM, MG, PHOS in the last 168 hours. CrCl cannot be calculated (Unknown ideal weight.). ***  Chest Imaging- films reviewed: ***  Pulmonary Functions Testing Results: No flowsheet data found.  Pathology: ***  Echocardiogram: ***  Heart Catheterization: ***    Assessment & Plan:   No diagnosis found.    Current Outpatient Medications:  .  Aspirin-Acetaminophen-Caffeine (GOODYS EXTRA STRENGTH) 500-325-65 MG PACK, Take 1-3 packets by mouth daily as needed (for pain). , Disp: , Rfl:  .  cloNIDine (CATAPRES) 0.3 MG tablet, Take 1 tablet (0.3 mg total) by mouth 2 (two) times daily., Disp: 60 tablet, Rfl: 0 .  losartan (COZAAR) 25 MG tablet, Take 1 tablet (25 mg total) by mouth daily., Disp: 30 tablet, Rfl: 0 .  phenytoin (DILANTIN) 50 MG tablet, Chew 1 tablet (50 mg total) by mouth 2 (two) times daily., Disp: 60 tablet, Rfl: 0 .  Tiotropium Bromide Monohydrate (SPIRIVA RESPIMAT) 2.5 MCG/ACT AERS, Inhale 2 puffs into the lungs daily., Disp: 4 g, Rfl: 6 .  Tiotropium Bromide Monohydrate (SPIRIVA RESPIMAT) 2.5 MCG/ACT AERS, Inhale 2 puffs into the lungs daily., Disp: 4 g, Rfl: 0 .  vitamin B-12 (CYANOCOBALAMIN) 1000 MCG tablet, Take 1 tablet (1,000 mcg total) by mouth daily., Disp: 30 tablet, Rfl: 0   I spent *** minutes on this encounter, including face to face time and non-face to face time spent reviewing records, charting, coordinating care, etc.   Julian Hy, DO [facility] 05/23/2020 10:14 AM

## 2020-07-05 ENCOUNTER — Ambulatory Visit: Payer: Medicare HMO | Admitting: Family Medicine

## 2020-07-11 ENCOUNTER — Emergency Department (HOSPITAL_COMMUNITY)
Admission: EM | Admit: 2020-07-11 | Discharge: 2020-07-12 | Disposition: A | Discharge status: Home / Self Care | Payer: Medicare HMO | Attending: Emergency Medicine | Admitting: Emergency Medicine

## 2020-07-11 ENCOUNTER — Other Ambulatory Visit: Payer: Self-pay

## 2020-07-11 ENCOUNTER — Emergency Department (HOSPITAL_COMMUNITY): Payer: Medicare HMO

## 2020-07-11 ENCOUNTER — Encounter (HOSPITAL_COMMUNITY): Payer: Self-pay | Admitting: Emergency Medicine

## 2020-07-11 DIAGNOSIS — Y929 Unspecified place or not applicable: Secondary | ICD-10-CM | POA: Insufficient documentation

## 2020-07-11 DIAGNOSIS — I10 Essential (primary) hypertension: Secondary | ICD-10-CM | POA: Diagnosis not present

## 2020-07-11 DIAGNOSIS — M549 Dorsalgia, unspecified: Secondary | ICD-10-CM | POA: Insufficient documentation

## 2020-07-11 DIAGNOSIS — F17210 Nicotine dependence, cigarettes, uncomplicated: Secondary | ICD-10-CM | POA: Insufficient documentation

## 2020-07-11 DIAGNOSIS — Z79899 Other long term (current) drug therapy: Secondary | ICD-10-CM | POA: Insufficient documentation

## 2020-07-11 DIAGNOSIS — W010XXA Fall on same level from slipping, tripping and stumbling without subsequent striking against object, initial encounter: Secondary | ICD-10-CM | POA: Insufficient documentation

## 2020-07-11 DIAGNOSIS — W19XXXA Unspecified fall, initial encounter: Secondary | ICD-10-CM

## 2020-07-11 DIAGNOSIS — J45909 Unspecified asthma, uncomplicated: Secondary | ICD-10-CM | POA: Insufficient documentation

## 2020-07-11 DIAGNOSIS — S0990XA Unspecified injury of head, initial encounter: Secondary | ICD-10-CM | POA: Diagnosis not present

## 2020-07-11 DIAGNOSIS — Y999 Unspecified external cause status: Secondary | ICD-10-CM | POA: Insufficient documentation

## 2020-07-11 DIAGNOSIS — M542 Cervicalgia: Secondary | ICD-10-CM | POA: Diagnosis not present

## 2020-07-11 DIAGNOSIS — R0902 Hypoxemia: Secondary | ICD-10-CM | POA: Diagnosis not present

## 2020-07-11 DIAGNOSIS — S199XXA Unspecified injury of neck, initial encounter: Secondary | ICD-10-CM | POA: Diagnosis not present

## 2020-07-11 DIAGNOSIS — R519 Headache, unspecified: Secondary | ICD-10-CM | POA: Diagnosis not present

## 2020-07-11 DIAGNOSIS — M546 Pain in thoracic spine: Secondary | ICD-10-CM | POA: Diagnosis not present

## 2020-07-11 DIAGNOSIS — S299XXA Unspecified injury of thorax, initial encounter: Secondary | ICD-10-CM | POA: Diagnosis not present

## 2020-07-11 DIAGNOSIS — M5489 Other dorsalgia: Secondary | ICD-10-CM | POA: Diagnosis not present

## 2020-07-11 DIAGNOSIS — Y939 Activity, unspecified: Secondary | ICD-10-CM | POA: Insufficient documentation

## 2020-07-11 DIAGNOSIS — M545 Low back pain: Secondary | ICD-10-CM | POA: Diagnosis not present

## 2020-07-11 DIAGNOSIS — S3992XA Unspecified injury of lower back, initial encounter: Secondary | ICD-10-CM | POA: Diagnosis not present

## 2020-07-11 DIAGNOSIS — R0781 Pleurodynia: Secondary | ICD-10-CM | POA: Diagnosis not present

## 2020-07-11 MED ORDER — NAPROXEN 500 MG PO TABS: 500.0000 mg | ORAL_TABLET | Freq: Once | ORAL | Status: DC

## 2020-07-11 MED ORDER — LIDOCAINE 5 % EX PTCH
1.0000 | MEDICATED_PATCH | CUTANEOUS | Status: DC
  Filled 2020-07-11: qty 1

## 2020-07-11 MED ORDER — LIDOCAINE 5 % EX PTCH: 1.0000 | MEDICATED_PATCH | CUTANEOUS | 0 refills | Status: DC

## 2020-07-11 MED ORDER — NAPROXEN 500 MG PO TABS
500.0000 mg | ORAL_TABLET | Freq: Two times a day (BID) | ORAL | 0 refills | Status: DC | PRN
Start: 2020-07-11 — End: 2021-10-14

## 2020-07-11 MED ORDER — METHOCARBAMOL 500 MG PO TABS
500.0000 mg | ORAL_TABLET | Freq: Three times a day (TID) | ORAL | 0 refills | Status: DC | PRN
Start: 2020-07-11 — End: 2022-12-26

## 2020-07-11 NOTE — ED Provider Notes (Signed)
[facility] DEPT Provider Note   CSN: [id number] Arrival date & time: 07/11/20  2215     History Chief Complaint  Patient presents with  . Fall    Richard [name] is a 49 y.o. male with a history of hypertension, seizures, prior VTE, prior brain bleed, & ETOH abuse who presents to the ED via EMS S/p fall shortly PTA with complaints of back pain. Patient states that he was working on his car, subsequently was walking, and tripped over a tree stump resulting in a fall that involved twisting of his body and hitting his head. He does not think he had any LOC. Reports primary pain is to his left lower back but he is having pain to the generalized back and to his neck as well. Worse with movement, no alleviating factors, no intervention PTA. Consumed 2 beers this evening PTA. He initially was requesting to leave, but ultimately was agreeable to staying. Denies headache, change in vision, numbness, weakness, dyspnea, abdominal pain, or cough. Denies anticoagulation use.   HPI     Past Medical History:  Diagnosis Date  . Asthma   . Brain bleed ([place])   . Hypertension   . Seizures ([place])    last episode 03/2013    Patient Active Problem List   Diagnosis Date Noted  . Right lower lobe lung mass 04/09/2020  . Right upper lobe pulmonary nodule 04/09/2020  . Alcohol abuse with intoxication ([place]) 03/04/2020  . Hypoxia 03/04/2020  . Swollen testicle   . Atelectasis   . Syncope 08/26/2015  . Acute encephalopathy 08/26/2015  . Seizure ([place])   . Acute pulmonary edema ([facility])   . Lower leg DVT (deep venous thromboembolism), acute ([place])   . Protein-calorie malnutrition, severe ([place]) 02/19/2015  . Bacteremia due to Staphylococcus 02/17/2015  . Alcohol withdrawal delirium ([place])   . Acute respiratory failure with hypoxia ([place]) 02/13/2015  . Acute respiratory failure with hypoxemia ([place])   . Altered mental status   . Metabolic acidosis   . Essential hypertension   .  Encephalopathy 02/11/2015  . Alcohol intoxication ([place]) 02/11/2015  . Lactic acidosis 02/11/2015  . Bleeding from the nose 03/19/2014  . TIA (transient ischemic attack) 12/24/2013  . Weakness 12/23/2013  . Left-sided weakness 12/23/2013  . Malignant hypertension 12/23/2013  . Seizure disorder ([place]) 12/23/2013  . Vomiting 06/18/2013  . Chest pain 06/18/2013  . Hypertensive urgency 10/30/2012  . Migraine variant 10/30/2012  . TOBACCO ABUSE 01/11/2008  . HYPERTENSION, BENIGN 11/24/2007  . CHRONIC FRONTAL SINUSITIS 11/24/2007  . Convulsions ([place]) 11/24/2007  . Headache(784.0) 11/24/2007    Past Surgical History:  Procedure Laterality Date  . LEG SURGERY         Family History  Problem Relation Age of Onset  . Cancer Father   . Diabetes Mellitus II Sister     Social History   Tobacco Use  . Smoking status: Current Every Day Smoker    Packs/day: 1.50    Years: 34.00    Pack years: 51.00    Types: Cigarettes    Start date: 31  . Smokeless tobacco: Never Used  . Tobacco comment: 04/09/20 12-14 cigs per day   Vaping Use  . Vaping Use: Never used  Substance Use Topics  . Alcohol use: Yes  . Drug use: Not Currently    Types: Cocaine    Comment: last use several years ago     Home Medications Prior to Admission medications   Medication Sig Start  Date End Date Taking? Authorizing Provider  Aspirin-Acetaminophen-Caffeine (GOODYS EXTRA STRENGTH) [phone] MG PACK Take 1-3 packets by mouth daily as needed (for pain).     [provider]  cloNIDine (CATAPRES) 0.3 MG tablet Take 1 tablet (0.3 mg total) by mouth 2 (two) times daily. 03/04/20   Patrecia Pour, MD  losartan (COZAAR) 25 MG tablet Take 1 tablet (25 mg total) by mouth daily. 05/05/19   Carmin Muskrat, MD  phenytoin (DILANTIN) 50 MG tablet Chew 1 tablet (50 mg total) by mouth 2 (two) times daily. 03/04/20   Patrecia Pour, MD  Tiotropium Bromide Monohydrate (SPIRIVA RESPIMAT) 2.5 MCG/ACT AERS Inhale 2 puffs  into the lungs daily. 04/09/20   Margaretha Seeds, MD  Tiotropium Bromide Monohydrate (SPIRIVA RESPIMAT) 2.5 MCG/ACT AERS Inhale 2 puffs into the lungs daily. 04/09/20   Margaretha Seeds, MD  vitamin B-12 (CYANOCOBALAMIN) 1000 MCG tablet Take 1 tablet (1,000 mcg total) by mouth daily. 03/04/20   Patrecia Pour, MD    Allergies    Dilaudid [hydromorphone hcl] and Tape  Review of Systems   Review of Systems  Constitutional: Negative for chills and fever.  Respiratory: Negative for cough and shortness of breath.   Cardiovascular: Negative for chest pain.  Gastrointestinal: Negative for abdominal pain and vomiting.  Musculoskeletal: Positive for back pain and neck pain.  Neurological: Negative for seizures, syncope, facial asymmetry, speech difficulty, weakness, numbness and headaches.  All other systems reviewed and are negative.   Physical Exam Updated Vital Signs BP (!) 144/109 (BP Location: Right Arm)   Pulse 99   Temp 98.1 F (36.7 C) (Oral)   Resp 18   Ht 5\' 9"  (1.753 m)   Wt (!) 101.6 kg   SpO2 92%   BMI 33.08 kg/m   Physical Exam Vitals and nursing note reviewed.  Constitutional:      General: He is not in acute distress.    Appearance: He is well-developed. He is not toxic-appearing.     Comments: Patient initially sleeping upon my entering the room, once awakened was alert.   HENT:     Head: Normocephalic and atraumatic. No raccoon eyes or Battle's sign.     Right Ear: No hemotympanum.     Left Ear: No hemotympanum.     Nose: Nose normal.     Mouth/Throat:     Pharynx: Uvula midline.  Eyes:     General:        Right eye: No discharge.        Left eye: No discharge.     Extraocular Movements: Extraocular movements intact.     Conjunctiva/sclera: Conjunctivae normal.     Pupils: Pupils are equal, round, and reactive to light.  Neck:     Comments: Recommended C-collar on initial assessment- patient refused.  Cardiovascular:     Rate and Rhythm: Normal rate and  regular rhythm.  Pulmonary:     Effort: Pulmonary effort is normal. No respiratory distress.     Breath sounds: Normal breath sounds. No wheezing, rhonchi or rales.  Chest:     Comments: Left lateral chest wall - no overlying ecchymosis or abrasions.  Abdominal:     General: There is no distension.     Palpations: Abdomen is soft.     Tenderness: There is no abdominal tenderness. There is no guarding or rebound.  Musculoskeletal:     Cervical back: Neck supple. Spinous process tenderness (diffuse, no focal vertebral tenderness/step off.) present.  Comments: Upper/lower extremities: no focal bony tenderness Back: Diffuse midline thoracic & lumbar tenderness extending to left paraspinal muscles- mores so on the left side. No point/focal vertebral tenderness or palpable step off.   Skin:    General: Skin is warm and dry.     Findings: No rash.  Neurological:     Comments: Clear speech. Oriented x 4. CN III-XII grossly intact. Sensation grossly intact x 4. 5/5 symmetric grip strength & strength with plantar/dorsiflexion bilaterally. Intact finger to nose. Gait is antalgic, not obviously ataxic.   Psychiatric:        Behavior: Behavior is agitated (mildly).     ED Results / Procedures / Treatments   Labs (all labs ordered are listed, but only abnormal results are displayed) Labs Reviewed - No data to display  EKG None  Radiology DG Ribs Unilateral W/Chest Left  Result Date: 07/11/2020 CLINICAL DATA:  50 year old male status post trip and fall. Pain. Lung nodule suspicious for bronchogenic carcinoma including on PET-CT earlier this year. EXAM: LEFT RIBS AND CHEST - 3+ VIEW COMPARISON:  Portable chest 05/09/2020. CTA chest 03/04/2020, and earlier. FINDINGS: Lung volumes and mediastinal contours are stable and within normal limits. No pneumothorax or pleural effusion. No acute pulmonary opacity, spiculated right upper lung nodule again noted. Visualized tracheal air column is within  normal limits. Negative visible bowel gas pattern. Bone mineralization is within normal limits. Multiple oblique views of the left ribs. No left rib fracture identified. Other visible osseous structures appear intact. IMPRESSION: 1.  No acute cardiopulmonary abnormality or rib fracture identified. 2. Spiculated right lung nodule(s) re-demonstrated, see details on PET-CT 04/15/2020. Electronically Signed   By: Genevie Ann M.D.   On: 07/11/2020 23:37   DG Thoracic Spine 2 View  Result Date: 07/11/2020 CLINICAL DATA:  49 year old male status post trip and fall. Pain. EXAM: THORACIC SPINE 2 VIEWS COMPARISON:  Chest radiographs today and earlier. FINDINGS: Twelve full size ribs. Hypoplastic right side ribs at L1. There is mild dextroconvex upper thoracic scoliosis but otherwise preserved thoracic vertebral height and alignment, including at the cervicothoracic junction. Mild lower thoracic endplate spurring with relatively preserved disc spaces. No acute osseous abnormality identified. IMPRESSION: No acute osseous abnormality identified in the thoracic spine. Electronically Signed   By: Genevie Ann M.D.   On: 07/11/2020 23:38   DG Lumbar Spine Complete  Result Date: 07/11/2020 CLINICAL DATA:  49 year old male status post trip and fall.  Pain. EXAM: LUMBAR SPINE - COMPLETE 4+ VIEW COMPARISON:  Thoracic radiographs today. Abdomen radiographs 08/30/2015. FINDINGS: Mildly transitional anatomy with hypoplastic right side ribs at L1. Vertebral height and alignment appears stable. No pars fracture. No spondylolisthesis. Visible sacrum and SI joints appear within normal limits. No acute osseous abnormality identified. Severe chronic disc space loss at L5-S1 with vacuum disc, bulky endplate spurring and sclerosis. Other disc spaces are preserved. Negative visible abdominal visceral contours. IMPRESSION: 1. No acute osseous abnormality identified in the lumbar spine. 2. Mildly transitional anatomy, hypoplastic right L1 rib. 3.  Chronic severe L5-S1 disc degeneration. Electronically Signed   By: Genevie Ann M.D.   On: 07/11/2020 23:41   CT Head Wo Contrast  Result Date: 07/11/2020 CLINICAL DATA:  Recent fall with headaches and neck pain, initial encounter EXAM: CT HEAD WITHOUT CONTRAST CT CERVICAL SPINE WITHOUT CONTRAST TECHNIQUE: Multidetector CT imaging of the head and cervical spine was performed following the standard protocol without intravenous contrast. Multiplanar CT image reconstructions of the cervical spine were also generated. COMPARISON:  03/03/2020 FINDINGS: CT HEAD FINDINGS Brain: Encephalomalacia changes are again noted in the left frontal lobe consistent with prior infarct. Scattered areas of decreased attenuation in the deep white matter are seen consistent with chronic white matter ischemic change. No findings to suggest acute hemorrhage, acute infarction or space-occupying mass lesion are noted. Vascular: No hyperdense vessel or unexpected calcification. Skull: Normal. Negative for fracture or focal lesion. Sinuses/Orbits: No acute finding. Other: None. CT CERVICAL SPINE FINDINGS Alignment: Straightening of the normal cervical lordosis is noted likely related to muscular spasm. Skull base and vertebrae: 7 cervical segments are well visualized. Vertebral body height is well maintained. Mild disc space narrowing is noted at C5-6 and C6-7. Associated osteophytes are noted at C5-6. No acute fracture or acute facet abnormality is noted. Soft tissues and spinal canal: Surrounding soft tissue structures are within normal limits. Upper chest: Visualized lung apices are unremarkable. Other: None IMPRESSION: CT of the head: Chronic changes as described stable from the previous study. No acute abnormality noted. CT of the cervical spine: Mild degenerative change. Straightening of the normal cervical lordosis likely related to muscular spasm. Electronically Signed   By: Inez Catalina M.D.   On: 07/11/2020 23:14   CT Cervical Spine  Wo Contrast  Result Date: 07/11/2020 CLINICAL DATA:  Recent fall with headaches and neck pain, initial encounter EXAM: CT HEAD WITHOUT CONTRAST CT CERVICAL SPINE WITHOUT CONTRAST TECHNIQUE: Multidetector CT imaging of the head and cervical spine was performed following the standard protocol without intravenous contrast. Multiplanar CT image reconstructions of the cervical spine were also generated. COMPARISON:  03/03/2020 FINDINGS: CT HEAD FINDINGS Brain: Encephalomalacia changes are again noted in the left frontal lobe consistent with prior infarct. Scattered areas of decreased attenuation in the deep white matter are seen consistent with chronic white matter ischemic change. No findings to suggest acute hemorrhage, acute infarction or space-occupying mass lesion are noted. Vascular: No hyperdense vessel or unexpected calcification. Skull: Normal. Negative for fracture or focal lesion. Sinuses/Orbits: No acute finding. Other: None. CT CERVICAL SPINE FINDINGS Alignment: Straightening of the normal cervical lordosis is noted likely related to muscular spasm. Skull base and vertebrae: 7 cervical segments are well visualized. Vertebral body height is well maintained. Mild disc space narrowing is noted at C5-6 and C6-7. Associated osteophytes are noted at C5-6. No acute fracture or acute facet abnormality is noted. Soft tissues and spinal canal: Surrounding soft tissue structures are within normal limits. Upper chest: Visualized lung apices are unremarkable. Other: None IMPRESSION: CT of the head: Chronic changes as described stable from the previous study. No acute abnormality noted. CT of the cervical spine: Mild degenerative change. Straightening of the normal cervical lordosis likely related to muscular spasm. Electronically Signed   By: Inez Catalina M.D.   On: 07/11/2020 23:14    Procedures Procedures (including critical care time)  Medications Ordered in ED Medications  lidocaine (LIDODERM) 5 % 1 patch  (has no administration in time range)    ED Course  I have reviewed the triage vital signs and the nursing notes.  Pertinent labs & imaging results that were available during my care of the patient were reviewed by me and considered in my medical decision making (see chart for details).    MDM Rules/Calculators/A&P                         Patient presents to the ED with complaints of back pain S/p fall. Patient nontoxic, initially sleeping- awoken  by staff & now awake and communicating. Requesting to leave but ultimately agreeable to stay, he has had 2 beers tonight but is alert & oriented x 4 & is ambulatory, he does not appear to meet criteria for IVC to stay should he change his mind.   Additional history obtained:  Additional history obtained from EMS & nursing note review.   Imaging Studies ordered:  I ordered imaging studies which included CT head/Cspine & xrays of the L ribs/chest, L spine, & T spine, I independently visualized and interpreted imaging which showed no acute injuries.   Patient ambulatory, tolerating PO, requesting to go home. Appears appropriate for discharge at this time. Will discharge home with analgesics (naproxen, robaxin, & lidoderm patches- last creatinine WNL, discussed no driving/operating heavy machinery when taking robaxin). I discussed results, treatment plan, need for follow-up, and return precautions with the patient. Provided opportunity for questions, patient confirmed understanding and is in agreement with plan.   Discussed findings & plan of care with Dr. Stark Jock- in agreement.  Portions of this note were generated with [employer]. Dictation errors may occur despite best attempts at proofreading.  Final Clinical Impression(s) / ED Diagnoses Final diagnoses:  Fall, initial encounter    Rx / DC Orders ED Discharge Orders         Ordered    lidocaine (LIDODERM) 5 %  Every 24 hours     Discontinue  Reprint     07/11/20 2353     methocarbamol (ROBAXIN) 500 MG tablet  Every 8 hours PRN     Discontinue  Reprint     07/11/20 2353    naproxen (NAPROSYN) 500 MG tablet  2 times daily PRN     Discontinue  Reprint     07/11/20 2353           Karston Hyland, Glynda Jaeger, PA-C 07/11/20 2357    Veryl Speak, MD 07/12/20 [phone]

## 2020-07-11 NOTE — ED Triage Notes (Signed)
Th patient fell after tripping over a tree stump. He now complains of back pain. Patient reports having one beer. He denies hitting his head.   EMS vitals: 144/88 BP 104 HR 20 RR 95% SPO2  98.4 Temp
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
## 2020-10-08 ENCOUNTER — Other Ambulatory Visit: Payer: Self-pay

## 2020-10-08 ENCOUNTER — Emergency Department (HOSPITAL_COMMUNITY)
Admission: EM | Admit: 2020-10-08 | Discharge: 2020-10-08 | Disposition: A | Discharge status: Home / Self Care | Payer: Medicare HMO | Attending: Emergency Medicine | Admitting: Emergency Medicine

## 2020-10-08 DIAGNOSIS — Z5321 Procedure and treatment not carried out due to patient leaving prior to being seen by health care provider: Secondary | ICD-10-CM | POA: Insufficient documentation

## 2020-10-08 DIAGNOSIS — F10929 Alcohol use, unspecified with intoxication, unspecified: Secondary | ICD-10-CM | POA: Diagnosis not present

## 2020-10-08 DIAGNOSIS — Y92410 Unspecified street and highway as the place of occurrence of the external cause: Secondary | ICD-10-CM | POA: Diagnosis not present

## 2020-10-08 DIAGNOSIS — Z041 Encounter for examination and observation following transport accident: Secondary | ICD-10-CM | POA: Insufficient documentation

## 2020-10-08 DIAGNOSIS — I10 Essential (primary) hypertension: Secondary | ICD-10-CM | POA: Diagnosis not present

## 2020-10-08 DIAGNOSIS — M545 Low back pain, unspecified: Secondary | ICD-10-CM | POA: Diagnosis not present

## 2020-10-08 DIAGNOSIS — R52 Pain, unspecified: Secondary | ICD-10-CM | POA: Diagnosis not present

## 2020-10-08 NOTE — ED Notes (Signed)
Pt refusing to stay and be seen. Dr. Tomi Bamberger to room pt wants to leave
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
## 2020-11-30 DIAGNOSIS — H5213 Myopia, bilateral: Secondary | ICD-10-CM | POA: Diagnosis not present

## 2021-02-27 DIAGNOSIS — I10 Essential (primary) hypertension: Secondary | ICD-10-CM | POA: Diagnosis not present

## 2021-02-27 DIAGNOSIS — G4489 Other headache syndrome: Secondary | ICD-10-CM | POA: Diagnosis not present

## 2021-02-27 DIAGNOSIS — R0902 Hypoxemia: Secondary | ICD-10-CM | POA: Diagnosis not present

## 2021-02-27 DIAGNOSIS — R55 Syncope and collapse: Secondary | ICD-10-CM | POA: Diagnosis not present

## 2021-07-19 ENCOUNTER — Emergency Department (HOSPITAL_COMMUNITY)
Admission: EM | Admit: 2021-07-19 | Discharge: 2021-07-19 | Discharge status: Against Medical Advice | Payer: Medicare HMO

## 2021-07-19 ENCOUNTER — Other Ambulatory Visit: Payer: Self-pay

## 2021-07-19 DIAGNOSIS — R609 Edema, unspecified: Secondary | ICD-10-CM | POA: Diagnosis not present

## 2021-08-05 DIAGNOSIS — R0789 Other chest pain: Secondary | ICD-10-CM | POA: Diagnosis not present

## 2021-08-05 DIAGNOSIS — I10 Essential (primary) hypertension: Secondary | ICD-10-CM | POA: Diagnosis not present

## 2021-08-05 DIAGNOSIS — R079 Chest pain, unspecified: Secondary | ICD-10-CM | POA: Diagnosis not present

## 2021-10-12 ENCOUNTER — Emergency Department (HOSPITAL_COMMUNITY): Payer: Medicare HMO

## 2021-10-12 ENCOUNTER — Inpatient Hospital Stay (HOSPITAL_COMMUNITY)
Admission: EM | Admit: 2021-10-12 | Discharge: 2021-10-14 | DRG: 069 | Disposition: A | Discharge status: Home / Self Care | Payer: Medicare HMO | Attending: Internal Medicine | Admitting: Internal Medicine

## 2021-10-12 ENCOUNTER — Other Ambulatory Visit: Payer: Self-pay

## 2021-10-12 DIAGNOSIS — Z20822 Contact with and (suspected) exposure to covid-19: Secondary | ICD-10-CM | POA: Diagnosis present

## 2021-10-12 DIAGNOSIS — Z79899 Other long term (current) drug therapy: Secondary | ICD-10-CM

## 2021-10-12 DIAGNOSIS — W19XXXA Unspecified fall, initial encounter: Secondary | ICD-10-CM

## 2021-10-12 DIAGNOSIS — R531 Weakness: Secondary | ICD-10-CM

## 2021-10-12 DIAGNOSIS — E669 Obesity, unspecified: Secondary | ICD-10-CM | POA: Diagnosis present

## 2021-10-12 DIAGNOSIS — J439 Emphysema, unspecified: Secondary | ICD-10-CM | POA: Diagnosis present

## 2021-10-12 DIAGNOSIS — H5461 Unqualified visual loss, right eye, normal vision left eye: Secondary | ICD-10-CM | POA: Diagnosis present

## 2021-10-12 DIAGNOSIS — R0902 Hypoxemia: Secondary | ICD-10-CM | POA: Diagnosis not present

## 2021-10-12 DIAGNOSIS — Z043 Encounter for examination and observation following other accident: Secondary | ICD-10-CM | POA: Diagnosis not present

## 2021-10-12 DIAGNOSIS — R569 Unspecified convulsions: Secondary | ICD-10-CM

## 2021-10-12 DIAGNOSIS — G459 Transient cerebral ischemic attack, unspecified: Principal | ICD-10-CM | POA: Diagnosis present

## 2021-10-12 DIAGNOSIS — Z833 Family history of diabetes mellitus: Secondary | ICD-10-CM | POA: Diagnosis not present

## 2021-10-12 DIAGNOSIS — G319 Degenerative disease of nervous system, unspecified: Secondary | ICD-10-CM | POA: Diagnosis not present

## 2021-10-12 DIAGNOSIS — D3501 Benign neoplasm of right adrenal gland: Secondary | ICD-10-CM | POA: Diagnosis not present

## 2021-10-12 DIAGNOSIS — H53461 Homonymous bilateral field defects, right side: Secondary | ICD-10-CM

## 2021-10-12 DIAGNOSIS — G8384 Todd's paralysis (postepileptic): Secondary | ICD-10-CM | POA: Diagnosis present

## 2021-10-12 DIAGNOSIS — F1490 Cocaine use, unspecified, uncomplicated: Secondary | ICD-10-CM | POA: Diagnosis present

## 2021-10-12 DIAGNOSIS — R29818 Other symptoms and signs involving the nervous system: Secondary | ICD-10-CM | POA: Diagnosis not present

## 2021-10-12 DIAGNOSIS — F17210 Nicotine dependence, cigarettes, uncomplicated: Secondary | ICD-10-CM | POA: Diagnosis present

## 2021-10-12 DIAGNOSIS — Z6833 Body mass index (BMI) 33.0-33.9, adult: Secondary | ICD-10-CM | POA: Diagnosis not present

## 2021-10-12 DIAGNOSIS — R918 Other nonspecific abnormal finding of lung field: Secondary | ICD-10-CM | POA: Diagnosis present

## 2021-10-12 DIAGNOSIS — G40909 Epilepsy, unspecified, not intractable, without status epilepticus: Secondary | ICD-10-CM

## 2021-10-12 DIAGNOSIS — R404 Transient alteration of awareness: Secondary | ICD-10-CM | POA: Diagnosis not present

## 2021-10-12 DIAGNOSIS — Z8673 Personal history of transient ischemic attack (TIA), and cerebral infarction without residual deficits: Secondary | ICD-10-CM

## 2021-10-12 DIAGNOSIS — I63513 Cerebral infarction due to unspecified occlusion or stenosis of bilateral middle cerebral arteries: Secondary | ICD-10-CM | POA: Diagnosis not present

## 2021-10-12 DIAGNOSIS — I160 Hypertensive urgency: Secondary | ICD-10-CM | POA: Diagnosis present

## 2021-10-12 DIAGNOSIS — R079 Chest pain, unspecified: Secondary | ICD-10-CM

## 2021-10-12 DIAGNOSIS — D3502 Benign neoplasm of left adrenal gland: Secondary | ICD-10-CM | POA: Diagnosis not present

## 2021-10-12 DIAGNOSIS — M79603 Pain in arm, unspecified: Secondary | ICD-10-CM | POA: Diagnosis not present

## 2021-10-12 DIAGNOSIS — G9389 Other specified disorders of brain: Secondary | ICD-10-CM | POA: Diagnosis not present

## 2021-10-12 DIAGNOSIS — R0789 Other chest pain: Secondary | ICD-10-CM | POA: Diagnosis not present

## 2021-10-12 DIAGNOSIS — I10 Essential (primary) hypertension: Secondary | ICD-10-CM | POA: Diagnosis present

## 2021-10-12 DIAGNOSIS — I63233 Cerebral infarction due to unspecified occlusion or stenosis of bilateral carotid arteries: Secondary | ICD-10-CM | POA: Diagnosis not present

## 2021-10-12 DIAGNOSIS — R4020 Unspecified coma: Secondary | ICD-10-CM | POA: Diagnosis not present

## 2021-10-12 LAB — RAPID URINE DRUG SCREEN, HOSP PERFORMED
Amphetamines: NOT DETECTED
Barbiturates: NOT DETECTED
Benzodiazepines: NOT DETECTED
Cocaine: POSITIVE — AB
Opiates: NOT DETECTED
Tetrahydrocannabinol: NOT DETECTED

## 2021-10-12 LAB — URINALYSIS, ROUTINE W REFLEX MICROSCOPIC
Bilirubin Urine: NEGATIVE
Glucose, UA: NEGATIVE mg/dL
Hgb urine dipstick: NEGATIVE
Ketones, ur: NEGATIVE mg/dL
Leukocytes,Ua: NEGATIVE
Nitrite: NEGATIVE
Protein, ur: NEGATIVE mg/dL
Specific Gravity, Urine: 1.033 — ABNORMAL HIGH (ref 1.005–1.030)
pH: 5 (ref 5.0–8.0)

## 2021-10-12 LAB — I-STAT CHEM 8, ED
BUN: 6 mg/dL (ref 6–20)
Calcium, Ion: 1.11 mmol/L — ABNORMAL LOW (ref 1.15–1.40)
Chloride: 107 mmol/L (ref 98–111)
Creatinine, Ser: 0.8 mg/dL (ref 0.61–1.24)
Glucose, Bld: 100 mg/dL — ABNORMAL HIGH (ref 70–99)
HCT: 53 % — ABNORMAL HIGH (ref 39.0–52.0)
Hemoglobin: 18 g/dL — ABNORMAL HIGH (ref 13.0–17.0)
Potassium: 4.1 mmol/L (ref 3.5–5.1)
Sodium: 142 mmol/L (ref 135–145)
TCO2: 23 mmol/L (ref 22–32)

## 2021-10-12 LAB — RESP PANEL BY RT-PCR (FLU A&B, COVID) ARPGX2
Influenza A by PCR: NEGATIVE
Influenza B by PCR: NEGATIVE
SARS Coronavirus 2 by RT PCR: NEGATIVE

## 2021-10-12 LAB — PROTIME-INR
INR: 1 (ref 0.8–1.2)
Prothrombin Time: 13.1 seconds (ref 11.4–15.2)

## 2021-10-12 LAB — DIFFERENTIAL
Abs Immature Granulocytes: 0.03 10*3/uL (ref 0.00–0.07)
Basophils Absolute: 0 10*3/uL (ref 0.0–0.1)
Basophils Relative: 0 %
Eosinophils Absolute: 0 10*3/uL (ref 0.0–0.5)
Eosinophils Relative: 0 %
Immature Granulocytes: 0 %
Lymphocytes Relative: 19 %
Lymphs Abs: 1.8 10*3/uL (ref 0.7–4.0)
Monocytes Absolute: 1.1 10*3/uL — ABNORMAL HIGH (ref 0.1–1.0)
Monocytes Relative: 12 %
Neutro Abs: 6.3 10*3/uL (ref 1.7–7.7)
Neutrophils Relative %: 69 %

## 2021-10-12 LAB — TROPONIN I (HIGH SENSITIVITY)
Troponin I (High Sensitivity): 7 ng/L (ref <18)
Troponin I (High Sensitivity): 9 ng/L (ref <18)

## 2021-10-12 LAB — COMPREHENSIVE METABOLIC PANEL
ALT: 12 U/L (ref 0–44)
AST: 16 U/L (ref 15–41)
Albumin: 3.4 g/dL — ABNORMAL LOW (ref 3.5–5.0)
Alkaline Phosphatase: 79 U/L (ref 38–126)
Anion gap: 9 (ref 5–15)
BUN: 6 mg/dL (ref 6–20)
CO2: 22 mmol/L (ref 22–32)
Calcium: 9.1 mg/dL (ref 8.9–10.3)
Chloride: 108 mmol/L (ref 98–111)
Creatinine, Ser: 0.88 mg/dL (ref 0.61–1.24)
GFR, Estimated: >60 mL/min (ref >60)
Glucose, Bld: 104 mg/dL — ABNORMAL HIGH (ref 70–99)
Potassium: 4.3 mmol/L (ref 3.5–5.1)
Sodium: 139 mmol/L (ref 135–145)
Total Bilirubin: 0.5 mg/dL (ref 0.3–1.2)
Total Protein: 7.2 g/dL (ref 6.5–8.1)

## 2021-10-12 LAB — CBC
HCT: 50.9 % (ref 39.0–52.0)
Hemoglobin: 16.7 g/dL (ref 13.0–17.0)
MCH: 30.4 pg (ref 26.0–34.0)
MCHC: 32.8 g/dL (ref 30.0–36.0)
MCV: 92.7 fL (ref 80.0–100.0)
Platelets: 319 10*3/uL (ref 150–400)
RBC: 5.49 MIL/uL (ref 4.22–5.81)
RDW: 13.4 % (ref 11.5–15.5)
WBC: 9.2 10*3/uL (ref 4.0–10.5)
nRBC: 0 % (ref 0.0–0.2)

## 2021-10-12 LAB — ETHANOL: Alcohol, Ethyl (B): 33 mg/dL — ABNORMAL HIGH (ref <10)

## 2021-10-12 LAB — APTT: aPTT: 31 seconds (ref 24–36)

## 2021-10-12 LAB — CBG MONITORING, ED: Glucose-Capillary: 98 mg/dL (ref 70–99)

## 2021-10-12 MED ORDER — IOHEXOL 350 MG/ML SOLN
100.0000 mL | Freq: Once | INTRAVENOUS | Status: AC | PRN
  Administered 2021-10-12: 100 mL via INTRAVENOUS

## 2021-10-12 MED ORDER — IOHEXOL 350 MG/ML SOLN
40.0000 mL | Freq: Once | INTRAVENOUS | Status: AC | PRN
  Administered 2021-10-12: 40 mL via INTRAVENOUS

## 2021-10-12 MED ORDER — IOHEXOL 350 MG/ML SOLN
50.0000 mL | Freq: Once | INTRAVENOUS | Status: AC | PRN
  Administered 2021-10-12: 50 mL via INTRAVENOUS

## 2021-10-12 NOTE — ED Provider Notes (Signed)
[facility] EMERGENCY DEPARTMENT Provider Note   CSN: [id number] Arrival date & time: 10/12/21  1747  An emergency department physician performed an initial assessment on this suspected stroke patient at 1750.  History Chief Complaint  Patient presents with   Code Stroke    Richard [name] is a 50 y.o. male with history of hypertension present emergency department with abrupt right-sided facial numbness and right arm and leg numbness and weakness.    EMS was called to the scene reported patient intermittent weakness and numbness on his right side seem to be resolving.  However upon arrival in the ED the patient again complains of weakness, also complaining of sharp chest pain and pain in his back.  Denies any history of MI, or aneurysm.  Supplemental hx provided by his wife, who reports he was normal when she returned from work around 1 pm.  At 5 pm he went into the bedroom and began complaining of numbness and weakness in his right arm and chest pain.  This chest pain is sharp, located in his mid epigastrium, radiates to his mid thoracic back, and he has never had this pain before.  It is currently high intensity.  Nothing makes it better or worse.    HPI     Past Medical History:  Diagnosis Date   Asthma    Brain bleed ([place])    Hypertension    Seizures ([place])    last episode 03/2013    Patient Active Problem List   Diagnosis Date Noted   Right lower lobe lung mass 04/09/2020   Right upper lobe pulmonary nodule 04/09/2020   Alcohol abuse with intoxication ([place]) 03/04/2020   Hypoxia 03/04/2020   Swollen testicle    Atelectasis    Syncope 08/26/2015   Acute encephalopathy 08/26/2015   Seizure ([place])    Acute pulmonary edema ([place])    Lower leg DVT (deep venous thromboembolism), acute ([place])    Protein-calorie malnutrition, severe ([place]) 02/19/2015   Bacteremia due to Staphylococcus 02/17/2015   Alcohol withdrawal delirium ([place])    Acute respiratory failure  with hypoxia ([place]) 02/13/2015   Acute respiratory failure with hypoxemia (H[facility])    Altered mental status    Metabolic acidosis    Essential hypertension    Encephalopathy 02/11/2015   Alcohol intoxication ([place]) 02/11/2015   Lactic acidosis 02/11/2015   Bleeding from the nose 03/19/2014   TIA (transient ischemic attack) 12/24/2013   Weakness 12/23/2013   Left-sided weakness 12/23/2013   Malignant hypertension 12/23/2013   Seizure disorder ([place]) 12/23/2013   Vomiting 06/18/2013   Chest pain 06/18/2013   Hypertensive urgency 10/30/2012   Migraine variant 10/30/2012   TOBACCO ABUSE 01/11/2008   HYPERTENSION, BENIGN 11/24/2007   CHRONIC FRONTAL SINUSITIS 11/24/2007   Convulsions ([place]) 11/24/2007   Headache(784.0) 11/24/2007    Past Surgical History:  Procedure Laterality Date   LEG SURGERY         Family History  Problem Relation Age of Onset   Cancer Father    Diabetes Mellitus II Sister     Social History   Tobacco Use   Smoking status: Every Day    Packs/day: 1.50    Years: 34.00    Pack years: 51.00    Types: Cigarettes    Start date: 1987   Smokeless tobacco: Never   Tobacco comments:    04/09/20 12-14 cigs per day   Vaping Use   Vaping Use: Never used  Substance Use Topics   Alcohol use:  Yes   Drug use: Not Currently    Types: Cocaine    Comment: last use several years ago     Home Medications Prior to Admission medications   Medication Sig Start Date End Date Taking? Authorizing Provider  Aspirin-Acetaminophen-Caffeine (GOODYS EXTRA STRENGTH) [id number] MG PACK Take 1-3 packets by mouth daily as needed (for pain).     [provider]  cloNIDine (CATAPRES) 0.3 MG tablet Take 1 tablet (0.3 mg total) by mouth 2 (two) times daily. 03/04/20   Patrecia Pour, MD  lidocaine (LIDODERM) 5 % Place 1 patch onto the skin daily. Remove & Discard patch within 12 hours or as directed by MD 07/11/20   Petrucelli, Aldona Bar R, PA-C  losartan (COZAAR) 25 MG tablet  Take 1 tablet (25 mg total) by mouth daily. 05/05/19   Richard Muskrat, MD  methocarbamol (ROBAXIN) 500 MG tablet Take 1 tablet (500 mg total) by mouth every 8 (eight) hours as needed for muscle spasms. 07/11/20   Petrucelli, Samantha R, PA-C  naproxen (NAPROSYN) 500 MG tablet Take 1 tablet (500 mg total) by mouth 2 (two) times daily as needed for moderate pain. 07/11/20   Petrucelli, Glynda Jaeger, PA-C  phenytoin (DILANTIN) 50 MG tablet Chew 1 tablet (50 mg total) by mouth 2 (two) times daily. 03/04/20   Patrecia Pour, MD  Tiotropium Bromide Monohydrate (SPIRIVA RESPIMAT) 2.5 MCG/ACT AERS Inhale 2 puffs into the lungs daily. 04/09/20   Margaretha Seeds, MD  Tiotropium Bromide Monohydrate (SPIRIVA RESPIMAT) 2.5 MCG/ACT AERS Inhale 2 puffs into the lungs daily. 04/09/20   Margaretha Seeds, MD  vitamin B-12 (CYANOCOBALAMIN) 1000 MCG tablet Take 1 tablet (1,000 mcg total) by mouth daily. 03/04/20   Patrecia Pour, MD    Allergies    Dilaudid [hydromorphone hcl] and Tape  Review of Systems   Review of Systems  Constitutional:  Negative for chills and fever.  Eyes:  Negative for pain and visual disturbance.  Respiratory:  Positive for shortness of breath. Negative for cough.   Cardiovascular:  Positive for chest pain. Negative for palpitations.  Gastrointestinal:  Negative for abdominal pain and vomiting.  Genitourinary:  Negative for dysuria and hematuria.  Musculoskeletal:  Positive for back pain. Negative for arthralgias.  Skin:  Negative for color change and rash.  Neurological:  Positive for weakness, light-headedness and numbness. Negative for syncope and speech difficulty.  All other systems reviewed and are negative.  Physical Exam Updated Vital Signs BP (!) 159/99 (BP Location: Left Arm)   Pulse 98   Resp 20   SpO2 93%   Physical Exam Constitutional:      General: He is not in acute distress. HENT:     Head: Normocephalic and atraumatic.  Eyes:     Conjunctiva/sclera: Conjunctivae  normal.     Pupils: Pupils are equal, round, and reactive to light.  Cardiovascular:     Rate and Rhythm: Normal rate and regular rhythm.     Pulses: Normal pulses.     Comments: Equal radial and pedal pulses, bounding bilaterally Pulmonary:     Effort: Pulmonary effort is normal. No respiratory distress.     Comments: 85% on room air Abdominal:     General: There is no distension.     Tenderness: There is no abdominal tenderness.  Skin:    General: Skin is warm and dry.  Neurological:     General: No focal deficit present.     Mental Status: He is alert. Mental status  is at baseline.     Comments: Right sided arm and leg weakness Right facial, right arm and right leg loss of sensation (complete)    ED Results / Procedures / Treatments   Labs (all labs ordered are listed, but only abnormal results are displayed) Labs Reviewed  I-STAT CHEM 8, ED - Abnormal; Notable for the following components:      Result Value   Glucose, Bld 100 (*)    Calcium, Ion 1.11 (*)    Hemoglobin 18.0 (*)    HCT 53.0 (*)    All other components within normal limits  RESP PANEL BY RT-PCR (FLU A&B, COVID) ARPGX2  ETHANOL  PROTIME-INR  APTT  CBC  DIFFERENTIAL  COMPREHENSIVE METABOLIC PANEL  RAPID URINE DRUG SCREEN, HOSP PERFORMED  URINALYSIS, ROUTINE W REFLEX MICROSCOPIC  CBG MONITORING, ED    EKG None  Radiology No results found.  Procedures .Critical Care Performed by: Wyvonnia Dusky, MD Authorized by: Wyvonnia Dusky, MD   Critical care provider statement:    Critical care time (minutes):  45   Critical care time was exclusive of:  Separately billable procedures and treating other patients   Critical care was necessary to treat or prevent imminent or life-threatening deterioration of the following conditions:  Circulatory failure and CNS failure or compromise   Critical care was time spent personally by me on the following activities:  Ordering and performing treatments and  interventions, ordering and review of laboratory studies, ordering and review of radiographic studies, pulse oximetry, review of old charts, examination of patient and evaluation of patient's response to treatment Comments:     Code stroke evaluation, repeat reassessments, consultation with specialist   Medications Ordered in ED Medications  iohexol (OMNIPAQUE) 350 MG/ML injection 100 mL (100 mLs Intravenous Contrast Given 10/12/21 1814)    ED Course  I have reviewed the triage vital signs and the nursing notes.  Pertinent labs & imaging results that were available during my care of the patient were reviewed by me and considered in my medical decision making (see chart for details).   Ddx with these symptoms include CVA versus LVO vs dissection vs other  Due to concerns for possible large vessel occlusion or stroke symptoms, a code stroke was activated.  Neurology was present and patient was taken for CT imaging including CTA.  Because of the high clinical risk for aortic dissection, CT scan of the chest with contrast was also ordered.  No dissection was noted, however the patient continues to have large masses in the lungs concerning for malignancy.  The patient was also noted to be hypoxic on arrival satting 86% on room air.  We have stabilized him on 3 L nasal cannula at 95%.  He reports that he is chronically short of breath, per his medical records, it appears that he is also chronically hypoxic and has refused or has been unable to wear oxygen at home.  The patient tells me he can basically get around the house and into the car, but often has to stop for air.  I spoke to our [facility] team regarding the options of providing home oxygen, and they reported they patient would require hospitalization to do this.  I personally reviewed the patient's prior medical records.  It appears he is been seen by pulmonology in the past but noted to have hypoxemia, as well as masses concerning for malignancy in  the lungs.  They were not able to consent him for biopsy or further  work-up of this mass per the note Dr Valeta Harms on 04/10/20.  Patient unfortunately does have a history of leaving AMA prior to completion of medical work-ups.  I personally reviewed and interpreted his labs.  No evidence of infection per UA.  COVID is negative.  CBC, troponin, BMP at baseline and overall unremarkable.  UDS is positive for cocaine.  If this was acutely used, this may be a trigger for both recrudescence of old stroke symptoms and also cocaine-induced chest pain.  EKG per my interpretation shows a sinus rhythm with no acute ischemic changes, the patient has chronically peaked T waves consistent with both LVH and also perhaps with recent cocaine used.  Neurology was consulted and recommending MRI of the brain.  It is not clear at this time and the patient is willing to stay for MRI to complete his workup.  Supplemental history is provided by the patient's wife at bedside.  Clinical Course as of 10/13/21 0945  Sun Oct 12, 2021  1824 No acute stroke on [facility] noted - no apparent LVO although final read is pending.  Given the patient's tearing chest pain and back pain, I asked the CT team to proceed with a CT dissection study which is being completed [MT]  1827 Unable to reach family with multiple attempts by myself and neurologist  [MT]  1903   IMPRESSION: 1. 7.5 cm central/perihilar RIGHT lung malignancy involving both the RIGHT UPPER and RIGHT LOWER lobes, with some extension into the mediastinum. 2.5 cm satellite mass within the RIGHT UPPER lobe. Mildly enlarged RIGHT hilar, RIGHT paratracheal and subcarinal lymph nodes likely representing lymphatic spread. 2. No evidence of aortic aneurysm or dissection. 3. No acute abnormality within the abdomen or pelvis. 4. Stable bilateral adrenal adenomas. 5. Bilateral hydroceles. [MT]  2003 Neuro requesting MRI brain and EEG [MT]  2029 Troponin I (High Sensitivity): 7 [MT]   2029 Patient is now asymptomatic, no chest pain, and has full use of his right arm again. His wife is at bedside - history updated. [MT]  2030 Patient not wanting to stay for further workup,  I strongly recommended MRI, they are discussing this with his wife [MT]  [place] flat, negative. [MT]    Clinical Course User Index [MT] Maghan Jessee, Carola Rhine, MD    Final Clinical Impression(s) / ED Diagnoses Final diagnoses:  None    Rx / DC Orders ED Discharge Orders     None        Wyvonnia Dusky, MD 10/13/21 [phone]

## 2021-10-12 NOTE — ED Notes (Signed)
Pt removed all cardiac cords and refusing them.

## 2021-10-12 NOTE — ED Notes (Signed)
Patient transported to CT 

## 2021-10-12 NOTE — Care Management (Signed)
ED RN Care Manager spoke with EDP concerning patient needing home oxygen, oxygen saturations down to the 80's on RA placed on 3LO2 sat up to 94%.   RNCM explained that we will not be able to arrange home oxygen tonight from the ED.  Patient will have to remain in the ED until set can be arranged. Discussed with patient, due it being weekend after hours we would not be able to contact the oxygen company until in the am. Patient verbalized understanding, Updated EDP , RN and  Daytime RNCM.  TOC will follow up to secure home oxygen.

## 2021-10-12 NOTE — ED Notes (Signed)
Pt came in complaining of right facial numbness, right arm weakness/numbness and right leg numbness/weakness. LKN at 1300. Code stroke called at 1753. Pt is alert and oriented x 4 .

## 2021-10-12 NOTE — ED Triage Notes (Signed)
LKN 1300. Pt is here for right arm and right leg numbness/weakness. Right facial numbness. Alert and oriented x 4. Currently reports chest and back pain. 85% on RA.

## 2021-10-12 NOTE — Consult Note (Signed)
NEUROLOGY CONSULTATION NOTE   Date of service: October 12, 2021 Patient Name: Richard [name] MRN:  [id number] DOB:  1971-06-07 Reason for consult: "Stroke code for R sided weakness, numbness and R vision deficit" Requesting Provider: Wyvonnia Dusky, MD _ _ _   _ __   _ __ _ _  __ __   _ __   __ _  History of Present Illness  Malikye Nazir is a 50 y.o. 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  Poor historian but reports that he took a nap at 1PM and woke up weak on the R side. The weakness improved a little when he got to the ambulance but never went away and then came back. He can not moe his LUE and LLE for me and is numb on his left side. He also has trouble with his vision on the left.  He endorses non compliance with dilantin, no obvious reason. Also reports stabbing chest pain going into the back upon awakening today. Reports hx of seizures and alcohol use.  mRS: 0 tNK: outside window Thrombectomy: Not offered, no LVO NIHSS components Score: Comment  1a Level of Conscious 0[x]  1[]  2[]  3[]      1b LOC Questions 0[x]  1[]  2[]       1c LOC Commands 0[x]  1[]  2[]       2 Best Gaze 0[x]  1[]  2[]       3 Visual 0[]  1[]  2[x]  3[]      4 Facial Palsy 0[x]  1[]  2[]  3[]      5a Motor Arm - left 0[x]  1[]  2[]  3[]  4[]  UN[]    5b Motor Arm - Right 0[]  1[]  2[]  3[x]  4[]  UN[]    6a Motor Leg - Left 0[x]  1[]  2[]  3[]  4[]  UN[]    6b Motor Leg - Right 0[]  1[]  2[x]  3[]  4[]  UN[]    7 Limb Ataxia 0[x]  1[]  2[]  3[]  UN[]     8 Sensory 0[]  1[]  2[x]  UN[]      9 Best Language 0[x]  1[]  2[]  3[]      10 Dysarthria 0[x]  1[]  2[]  UN[]      11 Extinct. and Inattention 0[x]  1[]  2[]       TOTAL: 9       ROS   Constitutional Denies weight loss, fever and chills.   HEENT Denies changes in vision and hearing.   Respiratory Denies SOB and cough.   CV Denies palpitations and CP   GI Denies abdominal  pain, nausea, vomiting and diarrhea.   GU Denies dysuria and urinary frequency.   MSK Denies myalgia and joint pain.   Skin Denies rash and pruritus.   Neurological Denies headache and syncope.   Psychiatric Denies recent changes in mood. Denies anxiety and depression.    Past History   Past Medical History:  Diagnosis Date   Asthma    Brain bleed ([place])    Hypertension    Seizures ([place])    last episode 03/2013   Past Surgical History:  Procedure Laterality Date   LEG SURGERY     Family History  Problem Relation Age of Onset   Cancer Father    Diabetes Mellitus II Sister    Social History   Socioeconomic History   Marital status: Married    Spouse name: Lattie Haw   Number of children: 1   Years of education: HS   Highest education level: Not on file  Occupational History  Employer: OTHER    Comment: n/a  Tobacco Use   Smoking status: Every Day    Packs/day: 1.50    Years: 34.00    Pack years: 51.00    Types: Cigarettes    Start date: 33   Smokeless tobacco: Never   Tobacco comments:    04/09/20 12-14 cigs per day   Vaping Use   Vaping Use: Never used  Substance and Sexual Activity   Alcohol use: Yes   Drug use: Not Currently    Types: Cocaine    Comment: last use several years ago    Sexual activity: Yes  Other Topics Concern   Not on file  Social History Narrative   Patient lives at home alone.   Caffeine Use: 4-5 cups daily   Social Determinants of Health   Financial Resource Strain: Not on file  Food Insecurity: Not on file  Transportation Needs: Not on file  Physical Activity: Not on file  Stress: Not on file  Social Connections: Not on file   Allergies  Allergen Reactions   Dilaudid [Hydromorphone Hcl] Nausea Only and Other (See Comments)    Bradycardia and hyperthermia, too   Tape Itching and Dermatitis    Medications  (Not in a hospital admission)    Vitals   Vitals:   10/12/21 1758 10/12/21 1813 10/12/21 1833 10/12/21 1900  BP:  (!) 172/103 (!) 159/101 (!) 159/110   Pulse:  98 (!) 107   Resp:  20 20   Temp:    98 F (36.7 C)  TempSrc:    Oral  SpO2:  93% 94%      There is no height or weight on file to calculate BMI.  Physical Exam   General: Laying comfortably in bed; in no acute distress.  HENT: Normal oropharynx and mucosa. Normal external appearance of ears and nose.  Neck: Supple, no pain or tenderness  CV: No JVD. No peripheral edema.  Pulmonary: Symmetric Chest rise. Normal respiratory effort.  Abdomen: Soft to touch, non-tender.  Ext: No cyanosis, edema, or deformity  Skin: No rash. Normal palpation of skin.   Musculoskeletal: Normal digits and nails by inspection. No clubbing.   Neurologic Examination  Mental status/Cognition: Alert, oriented to self, place, month and year, good attention. Speech/language: Fluent, comprehension intact, object naming intact, repetition intact. Cranial nerves:   CN II Pupils equal and reactive to light, L hemianopsia   CN III,IV,VI EOM intact, no gaze preference or deviation, no nystagmus   CN V normal sensation in V1, V2, and V3 segments bilaterally   CN VII no asymmetry, no nasolabial fold flattening   CN VIII normal hearing to speech   CN IX & X normal palatal elevation, no uvular deviation   CN XI 5/5 head turn and 5/5 shoulder shrug bilaterally   CN XII midline tongue protrusion   Motor:  Muscle bulk: normal, tone mildly increased everywhere Mvmt Root Nerve  Muscle Right Left Comments  SA C5/6 Ax Deltoid 0 5   EF C5/6 Mc Biceps 0 5   EE C6/7/8 Rad Triceps 0 5   WF C6/7 Med FCR     WE C7/8 PIN ECU     F Ab C8/T1 U ADM/FDI 0 5   HF L1/2/3 Fem Illopsoas 0 5   KE L2/3/4 Fem Quad 0 5   DF L4/5 D Peron Tib Ant 1 5   PF S1/2 Tibial Grc/Sol 1 5    Reflexes:  Right Left Comments  Pectoralis  Biceps (C5/6) 2 2   Brachioradialis (C5/6) 2 2    Triceps (C6/7) 2 2    Patellar (L3/4) 2 2    Achilles (S1)      Hoffman      Plantar     Jaw jerk     Sensation:  Light touch Decreased in R face, R leg and R arm to touch   Pin prick    Temperature    Vibration   Proprioception    Coordination/Complex Motor:  - Finger to Nose intact on the left. - Heel to shin unable to get him to do. - Rapid alternating movement are normal in LUE - Gait: unsafe to assess given R sided weakness.  Labs   CBC:  Recent Labs  Lab 10/12/21 1800 10/12/21 1807  WBC 9.2  --   NEUTROABS 6.3  --   HGB 16.7 18.0*  HCT 50.9 53.0*  MCV 92.7  --   PLT 319  --     Basic Metabolic Panel:  Lab Results  Component Value Date   NA 142 10/12/2021   K 4.1 10/12/2021   CO2 22 10/12/2021   GLUCOSE 100 (H) 10/12/2021   BUN 6 10/12/2021   CREATININE 0.80 10/12/2021   CALCIUM 9.1 10/12/2021   GFRNONAA >60 10/12/2021   GFRAA >60 05/09/2020   Lipid Panel:  Lab Results  Component Value Date   LDLCALC 80 12/24/2013   HgbA1c:  Lab Results  Component Value Date   HGBA1C 6.1 (H) 12/24/2013   Urine Drug Screen:     Component Value Date/Time   LABOPIA NONE DETECTED 10/12/2021 1835   COCAINSCRNUR POSITIVE (A) 10/12/2021 1835   LABBENZ NONE DETECTED 10/12/2021 1835   AMPHETMU NONE DETECTED 10/12/2021 [place] DETECTED 10/12/2021 [place] DETECTED 10/12/2021 1835    Alcohol Level     Component Value Date/Time   ETH 33 (H) 10/12/2021 1800    CT Head without contrast: Personally reviewed and CTH was negative for a large hypodensity concerning for a large territory infarct or hyperdensity concerning for an ICH  CT angio Head and Neck with contrast: Personally reviewed and No LVO  MRI Brain: pending  rEEG:  pending  Impression   Stevan Mansel is a 50 y.o. 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 He is outside tNK window and not a candidate for thrombectomy 2/2 no LVO. His neurologic  examination is waxing and waning and I am not entirely sure if this is a potential stuttering lacune vs true weakness. He does have vision deficit on the Right but no LVO on imaging. Some suspicion for a potential underlying functional weakness thou but difficult exam specially with him getting agitated so easily.  Recommendations  - Recommend MRI brain w/o contrast - recommend routine EEG - recommend UDS and EtOH levels. - continue Dilantin 50mg  BID. - further recs pending above. We will follow along. - defer workup for chest pain to ED team. ______________________________________________________________________  Discused with Dr. Langston Masker.  Thank you for the opportunity to take part in the care of this patient. If you have any further questions, please contact the neurology consultation attending.  Signed,  [place] Pager Number [phone] _ _ _   _ __   _ __ _ _  __ __   _ __   __ _

## 2021-10-12 NOTE — Discharge Instructions (Addendum)
You are seen in the emergency department for chest pain and right sided weakness.  You had a work-up done for stroke and other medical emergencies, including aortic dissection.    We recommended that you stay for an MRI of your brain to rule out a stroke.   You told us you no longer wanted to stay in the hospital and therefore would be leaving [place].  I explained clearly to you and your wife that we have not ruled out all serious, life-threatening conditions, including heart disease and stroke, and that you can have complications at home like a stroke, worsening pain, or even death.   We also talked about your oxygen level being low today.  You need to be on oxygen at home. We cannot provide oxygen tanks from the ER to take home.  You will need hospitalization to do this, or else talking to your lung doctor or primary care provider.  Low oxygen can lead to serious health problems, like strokes, heart attacks, and death.  Finally, your CT scan showed masses in your lungs.  These were seen on your last scans.  These are concerning for cancer.  I would encourage you to call your lung doctors again.  They talked to you last year about a biopsy and working up this condition.  If you have any new or worsening symptoms, including numbness and weakness, chest pain, lightheadedness, or passing out, please call 911 and return to the ER immediately.

## 2021-10-12 NOTE — ED Notes (Signed)
Pt is ambulatory to the bathroom with assistance. ?

## 2021-10-13 ENCOUNTER — Observation Stay (HOSPITAL_COMMUNITY): Payer: Medicare HMO

## 2021-10-13 ENCOUNTER — Emergency Department (HOSPITAL_COMMUNITY): Payer: Medicare HMO

## 2021-10-13 ENCOUNTER — Encounter (HOSPITAL_COMMUNITY): Payer: Self-pay | Admitting: Internal Medicine

## 2021-10-13 DIAGNOSIS — R079 Chest pain, unspecified: Secondary | ICD-10-CM

## 2021-10-13 DIAGNOSIS — E669 Obesity, unspecified: Secondary | ICD-10-CM | POA: Diagnosis present

## 2021-10-13 DIAGNOSIS — I10 Essential (primary) hypertension: Secondary | ICD-10-CM

## 2021-10-13 DIAGNOSIS — I160 Hypertensive urgency: Secondary | ICD-10-CM

## 2021-10-13 DIAGNOSIS — G40909 Epilepsy, unspecified, not intractable, without status epilepticus: Secondary | ICD-10-CM

## 2021-10-13 DIAGNOSIS — F17210 Nicotine dependence, cigarettes, uncomplicated: Secondary | ICD-10-CM | POA: Diagnosis present

## 2021-10-13 DIAGNOSIS — Z6833 Body mass index (BMI) 33.0-33.9, adult: Secondary | ICD-10-CM | POA: Diagnosis not present

## 2021-10-13 DIAGNOSIS — H5461 Unqualified visual loss, right eye, normal vision left eye: Secondary | ICD-10-CM | POA: Diagnosis present

## 2021-10-13 DIAGNOSIS — R531 Weakness: Secondary | ICD-10-CM

## 2021-10-13 DIAGNOSIS — G459 Transient cerebral ischemic attack, unspecified: Principal | ICD-10-CM

## 2021-10-13 DIAGNOSIS — J439 Emphysema, unspecified: Secondary | ICD-10-CM | POA: Diagnosis present

## 2021-10-13 DIAGNOSIS — R918 Other nonspecific abnormal finding of lung field: Secondary | ICD-10-CM | POA: Diagnosis present

## 2021-10-13 DIAGNOSIS — F1490 Cocaine use, unspecified, uncomplicated: Secondary | ICD-10-CM | POA: Diagnosis present

## 2021-10-13 DIAGNOSIS — G8384 Todd's paralysis (postepileptic): Secondary | ICD-10-CM | POA: Diagnosis present

## 2021-10-13 DIAGNOSIS — Z833 Family history of diabetes mellitus: Secondary | ICD-10-CM | POA: Diagnosis not present

## 2021-10-13 DIAGNOSIS — W19XXXA Unspecified fall, initial encounter: Secondary | ICD-10-CM | POA: Diagnosis not present

## 2021-10-13 DIAGNOSIS — Z20822 Contact with and (suspected) exposure to covid-19: Secondary | ICD-10-CM | POA: Diagnosis present

## 2021-10-13 DIAGNOSIS — Z043 Encounter for examination and observation following other accident: Secondary | ICD-10-CM | POA: Diagnosis not present

## 2021-10-13 DIAGNOSIS — Z8673 Personal history of transient ischemic attack (TIA), and cerebral infarction without residual deficits: Secondary | ICD-10-CM | POA: Diagnosis not present

## 2021-10-13 DIAGNOSIS — Z79899 Other long term (current) drug therapy: Secondary | ICD-10-CM | POA: Diagnosis not present

## 2021-10-13 LAB — CBC
HCT: 50.4 % (ref 39.0–52.0)
Hemoglobin: 16.2 g/dL (ref 13.0–17.0)
MCH: 30.3 pg (ref 26.0–34.0)
MCHC: 32.1 g/dL (ref 30.0–36.0)
MCV: 94.4 fL (ref 80.0–100.0)
Platelets: 331 10*3/uL (ref 150–400)
RBC: 5.34 MIL/uL (ref 4.22–5.81)
RDW: 13.8 % (ref 11.5–15.5)
WBC: 10.5 10*3/uL (ref 4.0–10.5)
nRBC: 0 % (ref 0.0–0.2)

## 2021-10-13 LAB — COMPREHENSIVE METABOLIC PANEL
ALT: 14 U/L (ref 0–44)
AST: 23 U/L (ref 15–41)
Albumin: 3.2 g/dL — ABNORMAL LOW (ref 3.5–5.0)
Alkaline Phosphatase: 81 U/L (ref 38–126)
Anion gap: 9 (ref 5–15)
BUN: 8 mg/dL (ref 6–20)
CO2: 23 mmol/L (ref 22–32)
Calcium: 9 mg/dL (ref 8.9–10.3)
Chloride: 102 mmol/L (ref 98–111)
Creatinine, Ser: 0.96 mg/dL (ref 0.61–1.24)
GFR, Estimated: >60 mL/min (ref >60)
Glucose, Bld: 110 mg/dL — ABNORMAL HIGH (ref 70–99)
Potassium: 4.5 mmol/L (ref 3.5–5.1)
Sodium: 134 mmol/L — ABNORMAL LOW (ref 135–145)
Total Bilirubin: 1.1 mg/dL (ref 0.3–1.2)
Total Protein: 6.9 g/dL (ref 6.5–8.1)

## 2021-10-13 LAB — ECHOCARDIOGRAM COMPLETE
AR max vel: 3.93 cm2
AV Peak grad: 7.1 mmHg
Ao pk vel: 1.34 m/s
Area-P 1/2: 3.43 cm2
Calc EF: 57.3 %
Height: 69 in
P 1/2 time: 402 msec
S' Lateral: 3.7 cm
Single Plane A2C EF: 57.5 %
Single Plane A4C EF: 58.8 %
Weight: 3584 oz

## 2021-10-13 LAB — HEMOGLOBIN A1C
Hgb A1c MFr Bld: 6.1 % — ABNORMAL HIGH (ref 4.8–5.6)
Mean Plasma Glucose: 128.37 mg/dL

## 2021-10-13 LAB — LIPID PANEL
Cholesterol: 124 mg/dL (ref 0–200)
HDL: 44 mg/dL (ref >40)
LDL Cholesterol: 68 mg/dL (ref 0–99)
Total CHOL/HDL Ratio: 2.8 RATIO
Triglycerides: 58 mg/dL (ref <150)
VLDL: 12 mg/dL (ref 0–40)

## 2021-10-13 LAB — HIV ANTIBODY (ROUTINE TESTING W REFLEX): HIV Screen 4th Generation wRfx: NONREACTIVE

## 2021-10-13 MED ORDER — THIAMINE HCL 100 MG PO TABS
100.0000 mg | ORAL_TABLET | Freq: Every day | ORAL | Status: DC
  Administered 2021-10-13 – 2021-10-14 (×2): 100 mg via ORAL
  Filled 2021-10-13 (×2): qty 1

## 2021-10-13 MED ORDER — PHENYTOIN 50 MG PO CHEW
50.0000 mg | CHEWABLE_TABLET | Freq: Two times a day (BID) | ORAL | Status: DC
  Administered 2021-10-13 – 2021-10-14 (×2): 50 mg via ORAL
  Filled 2021-10-13 (×4): qty 1

## 2021-10-13 MED ORDER — ASPIRIN 325 MG PO TABS
325.0000 mg | ORAL_TABLET | Freq: Every day | ORAL | Status: DC
  Administered 2021-10-13 – 2021-10-14 (×2): 325 mg via ORAL
  Filled 2021-10-13 (×2): qty 1

## 2021-10-13 MED ORDER — STROKE: EARLY STAGES OF RECOVERY BOOK
Freq: Once | Status: AC
  Filled 2021-10-13: qty 1

## 2021-10-13 MED ORDER — ENOXAPARIN SODIUM 40 MG/0.4ML IJ SOSY
40.0000 mg | PREFILLED_SYRINGE | INTRAMUSCULAR | Status: DC
  Administered 2021-10-13 – 2021-10-14 (×2): 40 mg via SUBCUTANEOUS
  Filled 2021-10-13 (×2): qty 0.4

## 2021-10-13 MED ORDER — ACETAMINOPHEN 160 MG/5ML PO SOLN: 650.0000 mg | ORAL | Status: DC | PRN

## 2021-10-13 MED ORDER — LIDOCAINE 5 % EX PTCH
1.0000 | MEDICATED_PATCH | Freq: Every day | CUTANEOUS | Status: DC
  Administered 2021-10-13 – 2021-10-14 (×2): 1 via TRANSDERMAL
  Filled 2021-10-13 (×2): qty 1

## 2021-10-13 MED ORDER — SODIUM CHLORIDE 0.9 % IV SOLN: INTRAVENOUS | Status: AC

## 2021-10-13 MED ORDER — ADULT MULTIVITAMIN W/MINERALS CH
1.0000 | ORAL_TABLET | Freq: Every day | ORAL | Status: DC
  Filled 2021-10-13 (×2): qty 1

## 2021-10-13 MED ORDER — ACETAMINOPHEN 325 MG PO TABS
650.0000 mg | ORAL_TABLET | ORAL | Status: DC | PRN
  Administered 2021-10-14: 650 mg via ORAL
  Filled 2021-10-13: qty 2

## 2021-10-13 MED ORDER — HYDRALAZINE HCL 20 MG/ML IJ SOLN: 10.0000 mg | INTRAMUSCULAR | Status: DC | PRN

## 2021-10-13 MED ORDER — ASPIRIN 300 MG RE SUPP: 300.0000 mg | Freq: Every day | RECTAL | Status: DC

## 2021-10-13 MED ORDER — FOLIC ACID 1 MG PO TABS
1.0000 mg | ORAL_TABLET | Freq: Every day | ORAL | Status: DC
  Administered 2021-10-13 – 2021-10-14 (×2): 1 mg via ORAL
  Filled 2021-10-13 (×2): qty 1

## 2021-10-13 MED ORDER — ACETAMINOPHEN 650 MG RE SUPP: 650.0000 mg | RECTAL | Status: DC | PRN

## 2021-10-13 MED ORDER — THIAMINE HCL 100 MG/ML IJ SOLN: 100.0000 mg | Freq: Every day | INTRAMUSCULAR | Status: DC

## 2021-10-13 NOTE — ED Notes (Signed)
Paged Pokhrel MD regarding diet order. Pt passed swallow eval last night at 2000 and again this morning. Notified Pokhrel MD

## 2021-10-13 NOTE — ED Notes (Signed)
Paged Pokhrel MD regarding diet order

## 2021-10-13 NOTE — H&P (Signed)
History and Physical    Mohannad Olivero PNT:[id number] DOB: [birth year] DOA: 10/12/2021  PCP: Patient, No Pcp Per (Inactive)  Patient coming from: Home.  Chief Complaint: Right-sided weakness.  HPI: Sanjiv Castorena is a 50 y.o. male with history of hypertension, prior TIA, emphysema, tobacco and alcohol use was brought to the ER after patient states he was walking back to his house from the porch when he suddenly lost consciousness and fell and following waking up he was weak on the right side.  Weakness is both right upper and lower extremity.  Denies any headache.  He did have some cough or chest pain.  ED Course: In the ER patient had CT head followed by CT perfusion study CT angiogram of the head and neck and also CT scan of the chest abdomen pelvis to rule out dissection.  Neurology on-call was consulted.  MRI brain was negative for anything acute.  On exam his right upper extremity weakness has resolved but he still has weakness in the right lower extremity.  CT angio does show the known history of lung mass which is being followed by oncologist.  High sensitive troponins are negative EKG is pending.  Drug screen is positive for cocaine.  COVID test negative.  Review of Systems: As per HPI, rest all negative.   Past Medical History:  Diagnosis Date   Asthma    Brain bleed ([place])    Hypertension    Seizures ([place])    last episode 03/2013    Past Surgical History:  Procedure Laterality Date   LEG SURGERY       reports that he has been smoking cigarettes. He started smoking about 35 years ago. He has a 51.00 pack-year smoking history. He has never used smokeless tobacco. He reports current alcohol use. He reports that he does not currently use drugs after having used the following drugs: Cocaine.  Allergies  Allergen Reactions   Dilaudid [Hydromorphone Hcl] Nausea Only and Other (See Comments)    Bradycardia and hyperthermia, too   Tape Itching and Dermatitis    Family History   Problem Relation Age of Onset   Cancer Father    Diabetes Mellitus II Sister     Prior to Admission medications   Medication Sig Start Date End Date Taking? Authorizing Provider  Artificial Tear Ointment (DRY EYES OP) Place 1-2 drops into both eyes 2 (two) times daily as needed (dry eyes).   Yes [provider]  aspirin EC 81 MG tablet Take 162 mg by mouth every 4 (four) hours as needed for mild pain. Swallow whole.   Yes [provider]  Aspirin-Acetaminophen-Caffeine [phone] MG PACK Take 1-3 packets by mouth daily as needed (for pain).    Yes [provider]  carbamide peroxide (DEBROX) 6.5 % OTIC solution Place 2 drops into both ears at bedtime as needed (ear pain).   Yes [provider]  cloNIDine (CATAPRES) 0.3 MG tablet Take 1 tablet (0.3 mg total) by mouth 2 (two) times daily. 03/04/20  Yes Patrecia Pour, MD  ibuprofen (ADVIL) 200 MG tablet Take 800 mg by mouth every 6 (six) hours as needed for headache or moderate pain.   Yes [provider]  lidocaine (LIDODERM) 5 % Place 1 patch onto the skin daily. Remove & Discard patch within 12 hours or as directed by MD 07/11/20  Yes Petrucelli, Samantha R, PA-C  losartan (COZAAR) 25 MG tablet Take 1 tablet (25 mg total) by mouth daily. 05/05/19  Yes  Carmin Muskrat, MD  methocarbamol (ROBAXIN) 500 MG tablet Take 1 tablet (500 mg total) by mouth every 8 (eight) hours as needed for muscle spasms. 07/11/20  Yes Petrucelli, Samantha R, PA-C  naproxen (NAPROSYN) 500 MG tablet Take 1 tablet (500 mg total) by mouth 2 (two) times daily as needed for moderate pain. 07/11/20  Yes Petrucelli, Samantha R, PA-C  phenytoin (DILANTIN) 50 MG tablet Chew 1 tablet (50 mg total) by mouth 2 (two) times daily. 03/04/20  Yes Patrecia Pour, MD  Tiotropium Bromide Monohydrate (SPIRIVA RESPIMAT) 2.5 MCG/ACT AERS Inhale 2 puffs into the lungs daily. 04/09/20  Yes Margaretha Seeds, MD  vitamin B-12 (CYANOCOBALAMIN) 1000 MCG tablet  Take 1 tablet (1,000 mcg total) by mouth daily. 03/04/20  Yes Patrecia Pour, MD  Tiotropium Bromide Monohydrate (SPIRIVA RESPIMAT) 2.5 MCG/ACT AERS Inhale 2 puffs into the lungs daily. Patient not taking: No sig reported 04/09/20   Margaretha Seeds, MD    Physical Exam: Constitutional: Moderately built and nourished. Vitals:   10/13/21 0230 10/13/21 0300 10/13/21 0419 10/13/21 0515  BP: (!) 143/106 (!) 139/102 (!) 150/105 (!) 155/106  Pulse: 88 (!) 101 (!) 103   Resp: (!) 28 (!) 26 (!) 28 (!) 25  Temp:      TempSrc:      SpO2: 93% 92% 94%   Weight:      Height:       Eyes: Anicteric no pallor. ENMT: No discharge from the ears eyes nose and mouth. Neck: No mass felt.  No neck rigidity. Respiratory: No rhonchi or crepitations. Cardiovascular: S1-S2 heard. Abdomen: Soft nontender bowel sound present. Musculoskeletal: No edema. Skin: No rash. Neurologic: Alert awake oriented to time place and person.  Unable to move his right lower extremity but able to move all other extremities.  No facial asymmetry tongue is midline pupils equal and reactive to light. Psychiatric: Gets easily agitated.   Labs on Admission: I have personally reviewed following labs and imaging studies  CBC: Recent Labs  Lab 10/12/21 1800 10/12/21 1807  WBC 9.2  --   NEUTROABS 6.3  --   HGB 16.7 18.0*  HCT 50.9 53.0*  MCV 92.7  --   PLT 319  --    Basic Metabolic Panel: Recent Labs  Lab 10/12/21 1800 10/12/21 1807  NA 139 142  K 4.3 4.1  CL 108 107  CO2 22  --   GLUCOSE 104* 100*  BUN 6 6  CREATININE 0.88 0.80  CALCIUM 9.1  --    GFR: Estimated Creatinine Clearance: 128.4 mL/min (by C-G formula based on SCr of 0.8 mg/dL). Liver Function Tests: Recent Labs  Lab 10/12/21 1800  AST 16  ALT 12  ALKPHOS 79  BILITOT 0.5  PROT 7.2  ALBUMIN 3.4*   No results for input(s): LIPASE, AMYLASE in the last 168 hours. No results for input(s): AMMONIA in the last 168 hours. Coagulation  Profile: Recent Labs  Lab 10/12/21 1800  INR 1.0   Cardiac Enzymes: No results for input(s): CKTOTAL, CKMB, CKMBINDEX, TROPONINI in the last 168 hours. BNP (last 3 results) No results for input(s): PROBNP in the last 8760 hours. HbA1C: No results for input(s): HGBA1C in the last 72 hours. CBG: Recent Labs  Lab 10/12/21 1756  GLUCAP 98   Lipid Profile: No results for input(s): CHOL, HDL, LDLCALC, TRIG, CHOLHDL, LDLDIRECT in the last 72 hours. Thyroid Function Tests: No results for input(s): TSH, T4TOTAL, FREET4, T3FREE, THYROIDAB in the last 72 hours.  Anemia Panel: No results for input(s): VITAMINB12, FOLATE, FERRITIN, TIBC, IRON, RETICCTPCT in the last 72 hours. Urine analysis:    Component Value Date/Time   COLORURINE YELLOW 10/12/2021 1835   APPEARANCEUR CLOUDY (A) 10/12/2021 1835   LABSPEC 1.033 (H) 10/12/2021 1835   PHURINE 5.0 10/12/2021 [place] 10/12/2021 [place] 10/12/2021 1835   HGBUR negative 01/16/2011 1530   BILIRUBINUR NEGATIVE 10/12/2021 [place] 10/12/2021 [place] NEGATIVE 10/12/2021 1835   UROBILINOGEN 1.0 07/15/2015 2137   NITRITE NEGATIVE 10/12/2021 1835   LEUKOCYTESUR NEGATIVE 10/12/2021 1835   Sepsis Labs: @LABRCNTIP (procalcitonin:4,lacticidven:4) ) Recent Results (from the past 240 hour(s))  Resp Panel by RT-PCR (Flu A&B, Covid) Nasopharyngeal Swab     Status: None   Collection Time: 10/12/21  6:02 PM   Specimen: Nasopharyngeal Swab; Nasopharyngeal(NP) swabs in vial transport medium  Result Value Ref Range Status   SARS Coronavirus 2 by RT PCR NEGATIVE NEGATIVE Final    Comment: (NOTE) SARS-CoV-2 target nucleic acids are NOT DETECTED.  The SARS-CoV-2 RNA is generally detectable in upper respiratory specimens during the acute phase of infection. The lowest concentration of SARS-CoV-2 viral copies this assay can detect is 138 copies/mL. A negative result does not preclude  SARS-Cov-2 infection and should not be used as the sole basis for treatment or other patient management decisions. A negative result may occur with  improper specimen collection/handling, submission of specimen other than nasopharyngeal swab, presence of viral mutation(s) within the areas targeted by this assay, and inadequate number of viral copies(<138 copies/mL). A negative result must be combined with clinical observations, patient history, and epidemiological information. The expected result is Negative.  Fact Sheet for Patients:  EntrepreneurPulse.com.au  Fact Sheet for Healthcare Providers:  IncredibleEmployment.be  This test is no t yet approved or cleared by the [place] FDA and  has been authorized for detection and/or diagnosis of SARS-CoV-2 by FDA under an Emergency Use Authorization (EUA). This EUA will remain  in effect (meaning this test can be used) for the duration of the COVID-19 declaration under Section 564(b)(1) of the Act, 21 U.S.C.section 360bbb-3(b)(1), unless the authorization is terminated  or revoked sooner.       Influenza A by PCR NEGATIVE NEGATIVE Final   Influenza B by PCR NEGATIVE NEGATIVE Final    Comment: (NOTE) The Xpert Xpress SARS-CoV-2/FLU/RSV plus assay is intended as an aid in the diagnosis of influenza from Nasopharyngeal swab specimens and should not be used as a sole basis for treatment. Nasal washings and aspirates are unacceptable for Xpert Xpress SARS-CoV-2/FLU/RSV testing.  Fact Sheet for Patients: EntrepreneurPulse.com.au  Fact Sheet for Healthcare Providers: IncredibleEmployment.be  This test is not yet approved or cleared by the [place] FDA and has been authorized for detection and/or diagnosis of SARS-CoV-2 by FDA under an Emergency Use Authorization (EUA). This EUA will remain in effect (meaning this test can be used) for the duration of  the COVID-19 declaration under Section 564(b)(1) of the Act, 21 U.S.C. section 360bbb-3(b)(1), unless the authorization is terminated or revoked.  Performed at [facility], [place] [address], [place], [place] [address]      Radiological Exams on Admission: MR BRAIN WO CONTRAST  Result Date: 10/13/2021 CLINICAL DATA:  Initial evaluation for acute TIA, right-sided weakness. EXAM: MRI HEAD WITHOUT CONTRAST TECHNIQUE: Multiplanar, multiecho pulse sequences of the brain and surrounding structures were obtained without intravenous contrast. COMPARISON:  Prior CTs from 10/12/2021. FINDINGS: Brain:  Diffuse prominence of the CSF containing spaces compatible generalized cerebral atrophy. Associated diffuse prominence of the perivascular spaces. Patchy and confluent T2/FLAIR hyperintensity within the periventricular deep white matter both cerebral hemispheres as well as the pons, most consistent with chronic small vessel ischemic disease, moderate in nature. Chronic encephalomalacia and gliosis involving the anterior left frontal lobe again noted, which could be related to prior infarct and/or trauma. No abnormal foci of restricted diffusion to suggest acute or subacute ischemia. Gray-white matter differentiation maintained. No encephalomalacia to suggest chronic cortical infarction elsewhere within the brain. No acute intracranial hemorrhage. Few scattered punctate chronic micro hemorrhages noted, likely small vessel related. No mass lesion, midline shift or mass effect. No hydrocephalus or extra-axial fluid collection. Pituitary gland suprasellar region within normal limits. Midline structures intact. Vascular: Major intracranial vascular flow voids are maintained. Skull and upper cervical spine: Craniocervical junction within normal limits. Bone marrow signal intensity normal. No scalp soft tissue abnormality. Sinuses/Orbits: Globes and orbital soft tissues within normal limits. Chronic left maxillary  sinusitis noted. Paranasal sinuses are otherwise clear. No mastoid effusion. Other: None. IMPRESSION: 1. No acute intracranial infarct or other abnormality. 2. Chronic encephalomalacia and gliosis involving the anterior left frontal lobe, stable. 3. Underlying atrophy with moderate chronic microvascular ischemic disease. 4. Chronic left maxillary sinusitis. Electronically Signed   By: Jeannine Boga M.D.   On: 10/13/2021 03:00   CT CEREBRAL PERFUSION W CONTRAST  Result Date: 10/12/2021 CLINICAL DATA:  Initial evaluation for neuro deficit, stroke suspected. EXAM: CT PERFUSION BRAIN TECHNIQUE: Multiphase CT imaging of the brain was performed following IV bolus contrast injection. Subsequent parametric perfusion maps were calculated using RAPID software. CONTRAST:  44mL OMNIPAQUE IOHEXOL 350 MG/ML SOLN COMPARISON:  Prior CT and CTA from earlier the same day. FINDINGS: CT Brain Perfusion Findings: CBF (<30%) Volume: 52mL Perfusion (Tmax>6.0s) volume: 28mL Mismatch Volume: -52mL ASPECTS on noncontrast CT Head: 10 at 6:07 p.m. today. Infarct Core: No acute core infarct. Infarction Location:Matched perfusion defect at the anterior left frontal convexity, corresponding with area of chronic encephalomalacia a seen on prior CT. No other acute core infarct or other perfusion deficit. IMPRESSION: 1. Negative CT perfusion with no evidence for acute ischemia or other perfusion deficit. 2. 3 mL matched perfusion defect at the anterior left frontal convexity, corresponding with area of chronic encephalomalacia seen on prior CT. Electronically Signed   By: Jeannine Boga M.D.   On: 10/12/2021 19:09   CT Angio Chest/Abd/Pel for Dissection W and/or W/WO  Result Date: 10/12/2021 CLINICAL DATA:  50 year old male with chest, back and abdominal pain. EXAM: CT ANGIOGRAPHY CHEST, ABDOMEN AND PELVIS TECHNIQUE: Non-contrast CT of the chest was initially obtained. Multidetector CT imaging through the chest, abdomen and  pelvis was performed using the standard protocol during bolus administration of intravenous contrast. Multiplanar reconstructed images and MIPs were obtained and reviewed to evaluate the vascular anatomy. CONTRAST:  105mL OMNIPAQUE IOHEXOL 350 MG/ML SOLN COMPARISON:  04/15/2020 PET CT and prior studies FINDINGS: CTA CHEST FINDINGS Cardiovascular: Preferential opacification of the thoracic aorta. No evidence of thoracic aortic aneurysm or dissection. Normal heart size. No pericardial effusion. Mediastinum/Nodes: Enlarged RIGHT hilar, RIGHT paratracheal and subcarinal lymph nodes are noted with index nodes including a 1.6 cm RIGHT paratracheal node (series 6: Image 51), a 1.6 cm subcarinal node (6:64). The trachea, visualized thyroid and esophagus are unremarkable. Lungs/Pleura: A 7.5 cm irregular mass is identified within the central/perihilar RIGHT lung along both sides of the major fissure and involving both the RIGHT  UPPER and RIGHT LOWER lobes compatible with malignancy. This mass extends into the mediastinum. A 2.5 cm satellite mass within the RIGHT UPPER lobe is noted (8:52). The LEFT lung is clear. No pleural effusion or pneumothorax. Musculoskeletal: No acute or suspicious bony abnormalities are noted. Review of the MIP images confirms the above findings. CTA ABDOMEN AND PELVIS FINDINGS VASCULAR Aorta: Aortic atherosclerotic calcifications noted. Normal caliber aorta without aneurysm, dissection, vasculitis or significant stenosis. Celiac: Patent without evidence of aneurysm, dissection, vasculitis or significant stenosis. SMA: Patent without evidence of aneurysm, dissection, vasculitis or significant stenosis. Renals: Both renal arteries are patent without evidence of aneurysm, dissection, vasculitis, fibromuscular dysplasia or significant stenosis. IMA: Patent without evidence of aneurysm, dissection, vasculitis or significant stenosis. Inflow: Patent without evidence of aneurysm, dissection, vasculitis or  significant stenosis. Veins: No obvious venous abnormality within the limitations of this arterial phase study. Review of the MIP images confirms the above findings. NON-VASCULAR Hepatobiliary: The liver and gallbladder are unremarkable. No biliary dilatation. Pancreas: Unremarkable Spleen: Unremarkable Adrenals/Urinary Tract: The kidneys and adrenal glands are unremarkable except for stable bilateral adrenal adenomas and a RIGHT renal cyst. Stomach/Bowel: Stomach is within normal limits. Appendix appears normal. No evidence of bowel wall thickening, distention, or inflammatory changes. Lymphatic: No abnormal lymph nodes are noted within the abdomen or pelvis. Reproductive: Prostate is unremarkable. Other: No ascites, focal collection or pneumoperitoneum. Bilateral hydroceles are present. Musculoskeletal: No acute or suspicious bony abnormalities are identified. Degenerative changes at L5-S1 again noted as well as a RIGHT iliac bone island. Review of the MIP images confirms the above findings. IMPRESSION: 1. 7.5 cm central/perihilar RIGHT lung malignancy involving both the RIGHT UPPER and RIGHT LOWER lobes, with some extension into the mediastinum. 2.5 cm satellite mass within the RIGHT UPPER lobe. Mildly enlarged RIGHT hilar, RIGHT paratracheal and subcarinal lymph nodes likely representing lymphatic spread. 2. No evidence of aortic aneurysm or dissection. 3. No acute abnormality within the abdomen or pelvis. 4. Stable bilateral adrenal adenomas. 5. Bilateral hydroceles. Electronically Signed   By: Margarette Canada M.D.   On: 10/12/2021 18:48   CT HEAD CODE STROKE WO CONTRAST  Result Date: 10/12/2021 CLINICAL DATA:  Code stroke. Neuro deficit, acute, stroke suspected. Right upper and lower extremity numbness and weakness. Right facial numbness. EXAM: CT HEAD WITHOUT CONTRAST TECHNIQUE: Contiguous axial images were obtained from the base of the skull through the vertex without intravenous contrast. COMPARISON:  CT  head without contrast 07/11/2020. FINDINGS: Brain: Chronic encephalomalacia of the anterior left frontal lobe is again noted. No acute cortical infarct is present. Basal ganglia are intact. Insular ribbon is within normal limits bilaterally. Diffuse white matter changes are stable. The ventricles are of normal size. No significant extraaxial fluid collection is present. Insert normal brainstem Vascular: No unexpected calcifications are hyperdense vessel is present. Skull: Calvarium is intact. No focal lytic or blastic lesions are present. Sinuses/Orbits: The paranasal sinuses and mastoid air cells are clear. The globes and orbits are within normal limits. ASPECTS [facility] Stroke Program Early CT Score) - Ganglionic level infarction (caudate, lentiform nuclei, internal capsule, insula, M1-M3 cortex): 7/7 - Supraganglionic infarction (M4-M6 cortex): 3/3 Total score (0-10 with 10 being normal): 10/10 IMPRESSION: 1. No acute intracranial abnormality or significant interval change. Aspects 10/10 2. Stable chronic encephalomalacia of the anterior left frontal lobe. 3. Stable diffuse white matter disease. These results were called by telephone at the time of interpretation on 10/12/2021 at 6:16 pm to provider [facility] , who verbally acknowledged these  results. Electronically Signed   By: San Morelle M.D.   On: 10/12/2021 18:16   CT ANGIO HEAD CODE STROKE  Result Date: 10/12/2021 CLINICAL DATA:  Stroke.  New right-sided weakness. EXAM: CT ANGIOGRAPHY HEAD AND NECK TECHNIQUE: Multidetector CT imaging of the head and neck was performed using the standard protocol during bolus administration of intravenous contrast. Multiplanar CT image reconstructions and MIPs were obtained to evaluate the vascular anatomy. Carotid stenosis measurements (when applicable) are obtained utilizing NASCET criteria, using the distal internal carotid diameter as the denominator. CONTRAST:  132mL OMNIPAQUE IOHEXOL 350 MG/ML  SOLN COMPARISON:  CT head without contrast 09/12/2021. FINDINGS: CTA NECK FINDINGS Aortic arch: The left common carotid artery and innominate artery share a common origin. Aortic arch is otherwise unremarkable. No stenosis or aneurysm is present. Right carotid system: The right common carotid artery is within normal limits. Imaging the bifurcation somewhat degraded by motion. No significant stenosis is present. The cervical right ICA is within normal limits. Left carotid system: The left common carotid artery is within normal limits. Images the bifurcation is somewhat degraded by patient motion. Cervical left ICA is within normal limits. Vertebral arteries: The left vertebral artery is the dominant vessel. Both vertebral arteries originate from the subclavian arteries. No significant stenosis is present in either vertebral artery in the neck. Skeleton: Vertebral body heights are maintained. Mild endplate changes are noted. No focal osseous abnormalities are present. Extensive dental disease is noted. Other neck: Soft tissues the neck are otherwise unremarkable. Salivary glands are within normal limits. Thyroid is normal. No significant adenopathy is present. No focal mucosal or submucosal lesions are present. Upper chest: Centrilobular emphysematous changes are present. No nodule or mass lesion is present. No significant pleural disease is present. Thoracic inlet is within normal limits. Review of the MIP images confirms the above findings CTA HEAD FINDINGS Anterior circulation: The internal carotid arteries are within normal limits from the high cervical segments through the ICA termini. The A1 and M1 segments are normal. MCA bifurcations are within normal limits. No significant proximal stenosis is present. Diffuse segmental narrowing is present at the distal ACA and MCA branch vessels bilaterally. Posterior circulation: The left vertebral artery is the dominant vessel. PICA origins are visualized and within  normal limits. Vertebrobasilar junction is normal. The basilar artery is normal. Both posterior cerebral arteries originate from the basilar tip. No significant proximal stenosis is present. There is moderate narrowing of distal PCA branch vessels. Venous sinuses: The dural sinuses are patent. The straight sinus deep cerebral veins are intact. Cortical veins are within normal limits. No significant vascular malformation is evident. Anatomic variants: None Review of the MIP images confirms the above findings IMPRESSION: 1. No emergent large vessel occlusion. 2. Diffuse segmental narrowing of the distal ACA and MCA branch vessels bilaterally without a significant proximal stenosis, aneurysm, or branch vessel occlusion. Findings are compatible with intracranial atherosclerotic change. 3. Extensive dental disease. Electronically Signed   By: San Morelle M.D.   On: 10/12/2021 18:29   CT ANGIO NECK CODE STROKE  Result Date: 10/12/2021 CLINICAL DATA:  Stroke.  New right-sided weakness. EXAM: CT ANGIOGRAPHY HEAD AND NECK TECHNIQUE: Multidetector CT imaging of the head and neck was performed using the standard protocol during bolus administration of intravenous contrast. Multiplanar CT image reconstructions and MIPs were obtained to evaluate the vascular anatomy. Carotid stenosis measurements (when applicable) are obtained utilizing NASCET criteria, using the distal internal carotid diameter as the denominator. CONTRAST:  146mL OMNIPAQUE IOHEXOL  350 MG/ML SOLN COMPARISON:  CT head without contrast 09/12/2021. FINDINGS: CTA NECK FINDINGS Aortic arch: The left common carotid artery and innominate artery share a common origin. Aortic arch is otherwise unremarkable. No stenosis or aneurysm is present. Right carotid system: The right common carotid artery is within normal limits. Imaging the bifurcation somewhat degraded by motion. No significant stenosis is present. The cervical right ICA is within normal limits.  Left carotid system: The left common carotid artery is within normal limits. Images the bifurcation is somewhat degraded by patient motion. Cervical left ICA is within normal limits. Vertebral arteries: The left vertebral artery is the dominant vessel. Both vertebral arteries originate from the subclavian arteries. No significant stenosis is present in either vertebral artery in the neck. Skeleton: Vertebral body heights are maintained. Mild endplate changes are noted. No focal osseous abnormalities are present. Extensive dental disease is noted. Other neck: Soft tissues the neck are otherwise unremarkable. Salivary glands are within normal limits. Thyroid is normal. No significant adenopathy is present. No focal mucosal or submucosal lesions are present. Upper chest: Centrilobular emphysematous changes are present. No nodule or mass lesion is present. No significant pleural disease is present. Thoracic inlet is within normal limits. Review of the MIP images confirms the above findings CTA HEAD FINDINGS Anterior circulation: The internal carotid arteries are within normal limits from the high cervical segments through the ICA termini. The A1 and M1 segments are normal. MCA bifurcations are within normal limits. No significant proximal stenosis is present. Diffuse segmental narrowing is present at the distal ACA and MCA branch vessels bilaterally. Posterior circulation: The left vertebral artery is the dominant vessel. PICA origins are visualized and within normal limits. Vertebrobasilar junction is normal. The basilar artery is normal. Both posterior cerebral arteries originate from the basilar tip. No significant proximal stenosis is present. There is moderate narrowing of distal PCA branch vessels. Venous sinuses: The dural sinuses are patent. The straight sinus deep cerebral veins are intact. Cortical veins are within normal limits. No significant vascular malformation is evident. Anatomic variants: None Review  of the MIP images confirms the above findings IMPRESSION: 1. No emergent large vessel occlusion. 2. Diffuse segmental narrowing of the distal ACA and MCA branch vessels bilaterally without a significant proximal stenosis, aneurysm, or branch vessel occlusion. Findings are compatible with intracranial atherosclerotic change. 3. Extensive dental disease. Electronically Signed   By: San Morelle M.D.   On: 10/12/2021 18:29     Assessment/Plan Principal Problem:   TIA (transient ischemic attack) Active Problems:   Hypertensive urgency   Seizure disorder (HCC)   Essential hypertension    Right-sided weakness which is improving but still has some weakness in the right lower extremity.  Possible TIA but also concern for possible seizures for which EEG has been ordered.  Appreciate neurology recommendation we will continue Dilantin.  I have ordered 2D echo to complete stroke work-up including hemoglobin A1c lipid panel physical therapy consult.  Patient did pass swallow.  Neurochecks. Hypertensive urgency will allow for permissive hypertension at this time.  IV hydralazine has been ordered for systolic more than 188.  Patient states he has not been taking his antihypertensives last few days as he ran out of it. Lung mass being followed by pulmonologist. Emphysema recently placed on Spiriva.  Requiring 2 L oxygen at this time. Seizure disorder on Dilantin. Tobacco alcohol and cocaine use.  Social work consult for counseling.  On CIWA.  X-ray of the pelvis and EKG are pending.  DVT prophylaxis: Lovenox. Code Status: Full code. Family Communication: Discussed with patient. Disposition Plan: To be determined. Consults called: Neurology. Admission status: Observation.   Rise Patience MD [facility] Pager [phone].  If 7PM-7AM, please contact night-coverage [email or web address] Password [email or web address]  10/13/2021, 6:45 AM

## 2021-10-13 NOTE — Plan of Care (Signed)
Chart review note  MRI brain negative for acute process. A1c - 6.1 LDL 68 EEG WNL 2D echo IMPRESSIONS   1. Left ventricular ejection fraction, by estimation, is 55 to 60%. The  left ventricle has normal function. The left ventricle has no regional  wall motion abnormalities. There is moderate left ventricular hypertrophy  of the basal-septal segment. Left  ventricular diastolic parameters are consistent with Grade I diastolic  dysfunction (impaired relaxation).   2. Right ventricular systolic function is normal. The right ventricular  size is normal. Tricuspid regurgitation signal is inadequate for assessing  PA pressure.   3. The mitral valve is normal in structure. No evidence of mitral valve  regurgitation. No evidence of mitral stenosis.   4. The aortic valve is normal in structure. Aortic valve regurgitation is  mild to moderate. No aortic stenosis is present. Aortic regurgitation PHT  measures 402 msec.   5. Aortic dilatation noted. There is mild dilatation of the aortic root,  measuring 39 mm.   6. The inferior vena cava is dilated in size with >50% respiratory  variability, suggesting right atrial pressure of 8 mmHg.   Conclusion(s)/Recommendation(s): No intracardiac source of embolism  detected on this transthoracic study. A transesophageal echocardiogram is  recommended to exclude cardiac source of embolism if clinically indicated.    Differentials include TIA vs Sz with post ictal Todd's paralysis possible. Recrudescence of weakness from TBI vs functional symptoms.  Recs as in the initial consult note from Dr. Lorrin Goodell.  Please call with questions.  -- Amie Portland, MD Neurologist [facility] Pager: [phone]

## 2021-10-13 NOTE — Evaluation (Signed)
Physical Therapy Evaluation Patient Details Name: Richard [name] MRN: [id number] DOB: 1971/06/25 Today's Date: 10/13/2021  History of Present Illness  Richard [name] is a 50 y.o. male presented to ED on 10/12/21 after stating he was walking back to his house from the porch when he suddenly lost consciousness and fell and following waking up he was weak on the right side. +cocaine. MRI: negative for acute event. PHMx: hypertension, prior TIA, emphysema, tobacco and alcohol use,CT angio does show the known history of lung mass which is being followed by oncologist   Clinical Impression  Pt admitted with above diagnosis. At baseline pt is independent.  Today, pt with inconsistencies in presentation with L LE strength.  Initially unable to straighten knee, but then unable to bend and keeps straight, but bends for functional transfers. DF testing 0/5 but with gait had at least neutral DF, no foot drop. Sensation testing was decreased in R LE - did not w/d to noxious stimuli.    He did ambulated 150' with RW and min guard.  Due to deficits do recommend further acute therapy and HHPT. Pt currently with functional limitations due to the deficits listed below (see PT Problem List). Pt will benefit from skilled PT to increase their independence and safety with mobility to allow discharge to the venue listed below.    Pt was on 3.5 L O2 at arrival with sats 96%.  Tried RA and down to 90% at rest.  Ambulated on RA with sats down to 86%.  Increased to 2 L for remainder of ambulation and sats 93%. Left on 2 L.        Recommendations for follow up therapy are one component of a multi-disciplinary discharge planning process, led by the attending physician.  Recommendations may be updated based on patient status, additional functional criteria and insurance authorization.  Follow Up Recommendations Home health PT    Assistance Recommended at Discharge Frequent or constant Supervision/Assistance (initially for a few  days)  Functional Status Assessment Patient has had a recent decline in their functional status and demonstrates the ability to make significant improvements in function in a reasonable and predictable amount of time.  Equipment Recommendations  Rolling walker (2 wheels)    Recommendations for Other Services       Precautions / Restrictions Precautions Precautions: Fall Restrictions Weight Bearing Restrictions: No      Mobility  Bed Mobility Overal bed mobility: Independent             General bed mobility comments: sititng at arrival    Transfers Overall transfer level: Needs assistance Equipment used: Rolling walker (2 wheels) Transfers: Sit to/from Stand Sit to Stand: Min guard           General transfer comment: Min guard for safety.  Pt flexing R knee to stand and to sit without difficulty    Ambulation/Gait Ambulation/Gait assistance: Min guard Gait Distance (Feet): 150 Feet Assistive device: Rolling walker (2 wheels) Gait Pattern/deviations: Step-to pattern;Decreased stride length Gait velocity: decreased   General Gait Details: Frequent cues for RW proximity.  Pt ambulating with stiff R leg -only slight knee flexion at toe off.  Also, does have at least active DF to neutral position but limited heel strike.  Stairs            Wheelchair Mobility    Modified Rankin (Stroke Patients Only)       Balance Overall balance assessment: Needs assistance Sitting-balance support: No upper extremity supported;Feet supported Sitting balance-Leahy Scale:  Good     Standing balance support: No upper extremity supported;During functional activity Standing balance-Leahy Scale: Fair Standing balance comment: RW to ambulate but could static stand without AD                             Pertinent Vitals/Pain Pain Assessment: Faces Faces Pain Scale: Hurts little more Pain Location: right side of head Pain Descriptors / Indicators:  Aching Pain Intervention(s): Limited activity within patient's tolerance;Monitored during session    [name] expects to be discharged to:: Private residence Living Arrangements: Spouse/significant other Available Help at Discharge: Family;Available PRN/intermittently Type of Home: Apartment Home Access: Stairs to enter Entrance Stairs-Rails: Right;Left;Can reach both Entrance Stairs-Number of Steps: 2   Home Layout: One level Home Equipment: Cane - single point      Prior Function Prior Level of Function : Independent/Modified Independent;Driving             Mobility Comments: (P) community ambulation without AD ADLs Comments: (P) Independent     Hand Dominance   Dominant Hand: Right (for writing, but also says he is ambidextrous)    Extremity/Trunk Assessment   Upper Extremity Assessment Upper Extremity Assessment: Defer to OT evaluation RUE Sensation: decreased light touch    Lower Extremity Assessment Lower Extremity Assessment: LLE deficits/detail;RLE deficits/detail RLE Deficits / Details: Pt with inconsistencies.  ROM WFL.  MMT: Initially stating could not straighten R knee but when lifted by therapist pt kept straight saying couldn't bend it.  Therapist tried to bend but unable.  However, when cues to stand -pt flexed knee and stood, then ambulated stiff legged, but flexed knee again to sit.  Ankle: 0/5 DF when tested but demonstrating at least neutral DF with gait.  Hip: at least 1/5 MMT. RLE Sensation: decreased light touch (Pt reports no sensation in R LE; did not w/d to pinch; states does not feel deep pressure) LLE Deficits / Details: ROM WFL; MMT 5/5 LLE Sensation: WNL LLE Coordination: WNL    Cervical / Trunk Assessment Cervical / Trunk Assessment: Normal  Communication   Communication: No difficulties  Cognition Arousal/Alertness: Awake/alert Behavior During Therapy: WFL for tasks assessed/performed Overall Cognitive Status:  Within Functional Limits for tasks assessed                                          General Comments General comments (skin integrity, edema, etc.): Pt educated on PT/OT role.  Discussed recommendation for supervision at home initially, use of RW, and HHPT.  Also, discussed HEP for AROM/AAROM R LE as able and sensation HEP of touching, patting, rubbing leg to stimulate sensation    Exercises     Assessment/Plan    PT Assessment Patient needs continued PT services  PT Problem List Decreased strength;Decreased mobility;Decreased balance;Decreased knowledge of use of DME;Impaired sensation       PT Treatment Interventions DME instruction;Therapeutic exercise;Gait training;Balance training;Stair training;Neuromuscular re-education;Functional mobility training;Therapeutic activities;Patient/family education    PT Goals (Current goals can be found in the Care Plan section)  Acute Rehab PT Goals Patient Stated Goal: return home; return to normal PT Goal Formulation: With patient/family Time For Goal Achievement: 10/27/21 Potential to Achieve Goals: Good    Frequency Min 3X/week   Barriers to discharge        Co-evaluation PT/OT/SLP Co-Evaluation/Treatment: Yes Reason for Co-Treatment: For  patient/therapist safety (syncope, in ED) PT goals addressed during session: Mobility/safety with mobility;Balance;Proper use of DME;Strengthening/ROM OT goals addressed during session: Strengthening/ROM;ADL's and self-care       AM-PAC PT "6 Clicks" Mobility  Outcome Measure Help needed turning from your back to your side while in a flat bed without using bedrails?: A Little Help needed moving from lying on your back to sitting on the side of a flat bed without using bedrails?: A Little Help needed moving to and from a bed to a chair (including a wheelchair)?: A Little Help needed standing up from a chair using your arms (e.g., wheelchair or bedside chair)?: A Little Help  needed to walk in hospital room?: A Little Help needed climbing 3-5 steps with a railing? : A Little 6 Click Score: 18    End of Session Equipment Utilized During Treatment: Gait belt Activity Tolerance: Patient tolerated treatment well Patient left: in chair;with call bell/phone within reach;with family/visitor present (in chair, aware to call, family present) Nurse Communication: Mobility status PT Visit Diagnosis: Other abnormalities of gait and mobility (R26.89);Hemiplegia and hemiparesis Hemiplegia - Right/Left: Right Hemiplegia - dominant/non-dominant: Dominant Hemiplegia - caused by: Unspecified    Time: 4827-0786 PT Time Calculation (min) (ACUTE ONLY): 33 min   Charges:   PT Evaluation $PT Eval Moderate Complexity: 1 Melina Schools, PT Acute Rehab Services Pager [phone] [facility] [phone]   Karlton Lemon 10/13/2021, 12:12 PM

## 2021-10-13 NOTE — Procedures (Signed)
Patient Name: Meer Reindl  MRN: [id number]  Epilepsy Attending: Lora Havens  Referring Physician/Provider: Dr Gean Birchwood Date: 10/13/2021 Duration: 21.49 mins  Patient history: 50 year old male with history of seizures will presented with right-sided weakness.  EEG to evaluate for seizure.  Level of alertness: Awake  AEDs during EEG study: Dilantin  Technical aspects: This EEG study was done with scalp electrodes positioned according to the 10-20 International system of electrode placement. Electrical activity was acquired at a sampling rate of 500Hz  and reviewed with a high frequency filter of 70Hz  and a low frequency filter of 1Hz . EEG data were recorded continuously and digitally stored.   Description: The posterior dominant rhythm consists of 8-9 Hz activity of moderate voltage (25-35 uV) seen predominantly in posterior head regions, symmetric and reactive to eye opening and eye closing. Hyperventilation and photic stimulation were not performed.     IMPRESSION: This study is within normal limits. No seizures or epileptiform discharges were seen throughout the recording.  Czar Ysaguirre Barbra Sarks

## 2021-10-13 NOTE — ED Notes (Signed)
Patient transported to MRI 

## 2021-10-13 NOTE — Evaluation (Signed)
Occupational Therapy Evaluation and Discharge Patient Details Name: Richard [name] MRN: [id number] DOB: 1971-04-04 Today's Date: 10/13/2021   History of Present Illness Zhyon Monks is a 50 y.o. male presented to ED after stating he was walking back to his house from the porch when he suddenly lost consciousness and fell and following waking up he was weak on the right side. +cocaine. MRI: negative for acute event. PHMx: hypertension, prior TIA, emphysema, tobacco and alcohol use,CT angio does show the known history of lung mass which is being followed by oncologist   Clinical Impression   This 50 yo male admitted with above presents to acute therapy with inconsistencies in his movement of RLE (first he said he couldn't straighten it, then once assisted to straighten it said he couldn't bend it but then when he went to stand he automatically bent it back), decreased sensation in RLE>RUE, and a "delay" in seeing things on right side. He can manage his basic ADLs at a RW level and has wife at home to A in evenings after work. No further OT needs, we will D/C from acute OT.     Recommendations for follow up therapy are one component of a multi-disciplinary discharge planning process, led by the attending physician.  Recommendations may be updated based on patient status, additional functional criteria and insurance authorization.   Follow Up Recommendations  No OT follow up    Assistance Recommended at Discharge Frequent or constant Supervision/Assistance  Functional Status Assessment  Patient has had a recent decline in their functional status and demonstrates the ability to make significant improvements in function in a reasonable and predictable amount of time. (without need for follow up OT)  Equipment Recommendations  None recommended by OT       Precautions / Restrictions Precautions Precautions: Fall Restrictions Weight Bearing Restrictions: No      Mobility Bed Mobility Overal bed  mobility: Independent                  Transfers Overall transfer level: Needs assistance Equipment used: Rolling walker (2 wheels) Transfers: Sit to/from Stand Sit to Stand: Min guard           General transfer comment: VCs to stay inside RW while ambulating--wanting to push the RW too far out in front of him      Balance Overall balance assessment: Needs assistance Sitting-balance support: No upper extremity supported;Feet supported Sitting balance-Leahy Scale: Good     Standing balance support: No upper extremity supported;During functional activity Standing balance-Leahy Scale: Fair                             ADL either performed or assessed with clinical judgement   ADL Overall ADL's : Needs assistance/impaired Eating/Feeding: Independent;Sitting   Grooming: Set up;Sitting   Upper Body Bathing: Set up;Sitting   Lower Body Bathing: Sit to/from stand;Min guard   Upper Body Dressing : Set up;Sitting   Lower Body Dressing: Min guard;Sit to/from stand   Toilet Transfer: Min guard;Ambulation;Rolling walker (2 wheels)   Toileting- Clothing Manipulation and Hygiene: Min guard;Sit to/from stand               Vision Baseline Vision/History: 1 Wears glasses Ability to See in Adequate Light: 0 Adequate Additional Comments: Reports a "delay" on right side when testing. He explains this as "not seeing the object as cleary on the right as he does on the left"  Pertinent Vitals/Pain Pain Assessment: Faces Faces Pain Scale: Hurts little more Pain Location: right side of head Pain Descriptors / Indicators: Aching Pain Intervention(s): Limited activity within patient's tolerance;Monitored during session     Hand Dominance Right (for writing, but also says he is ambidextrous)   Extremity/Trunk Assessment Upper Extremity Assessment Upper Extremity Assessment: RUE deficits/detail RUE Sensation: decreased light touch            Communication Communication Communication: No difficulties   Cognition Arousal/Alertness: Awake/alert Behavior During Therapy: WFL for tasks assessed/performed Overall Cognitive Status: Within Functional Limits for tasks assessed                                                  Home Living Family/patient expects to be discharged to:: Private residence Living Arrangements: Spouse/significant other Available Help at Discharge: Family;Available PRN/intermittently Type of Home: Apartment Home Access: Stairs to enter Entrance Stairs-Number of Steps: 2 Entrance Stairs-Rails: Right;Left;Can reach both Home Layout: One level     Bathroom Shower/Tub: Tub/shower unit;Curtain   Bathroom Toilet: Handicapped height     Home Equipment: Cane - single point          Prior Functioning/Environment Prior Level of Function : Independent/Modified Independent;Driving             Mobility Comments: (P) community ambulation without AD ADLs Comments: (P) Independent        OT Problem List: Decreased range of motion;Impaired balance (sitting and/or standing)         OT Goals(Current goals can be found in the care plan section) Acute Rehab OT Goals Patient Stated Goal: for my leg to get better             Co-evaluation PT/OT/SLP Co-Evaluation/Treatment: Yes Reason for Co-Treatment: For patient/therapist safety;To address functional/ADL transfers PT goals addressed during session: Mobility/safety with mobility;Balance;Proper use of DME;Strengthening/ROM OT goals addressed during session: Strengthening/ROM;ADL's and self-care      AM-PAC OT "6 Clicks" Daily Activity     Outcome Measure Help from another person eating meals?: None Help from another person taking care of personal grooming?: None Help from another person toileting, which includes using toliet, bedpan, or urinal?: A Little Help from another person bathing (including washing, rinsing, drying)?: A  Little Help from another person to put on and taking off regular upper body clothing?: None Help from another person to put on and taking off regular lower body clothing?: A Little 6 Click Score: 21   End of Session Equipment Utilized During Treatment: Gait belt;Rolling walker (2 wheels) Nurse Communication: Mobility status (pt informed not to get up without calling, stand pivot without RW, ambulation with RW)  Activity Tolerance: Patient tolerated treatment well Patient left: in chair;with call bell/phone within reach;with family/visitor present  OT Visit Diagnosis: Unsteadiness on feet (R26.81);Other abnormalities of gait and mobility (R26.89)                Time: 3007-6226 OT Time Calculation (min): 32 min Charges:  OT General Charges $OT Visit: 1 Visit OT Evaluation $OT Eval Moderate Complexity: [place], OTR/L Acute [employer] Pager [phone] Office [phone]    Almon Register 10/13/2021, 12:00 PM

## 2021-10-13 NOTE — ED Provider Notes (Signed)
3:45 AM Assumed care from Dr. Langston Masker, please see their note for full history, physical and decision making until this point. In brief this is a 50 y.o. year old male who presented to the ED tonight with Code Stroke     Patient was made a code stroke for right sided symptoms. Slowly improved prior to arrival. Then had some waxing and waning. Was pending MRI at time of check out. Also has likely unconfirmed lung cancer and emphysema (verified by pulm note from last year) and associated hypoxia to 85% at rest on room air, so will need at least an obs for that to qualify for home oxygen.   MRI without acute changes. Discussed with Dr. Leonel Ramsay with neurology who read previous neurology's note and thinks he needs tia/seizure workup.   Will consult medicien for admission.   CRITICAL CARE Performed by: Merrily Pew Total critical care time: 35 minutes Critical care time was exclusive of separately billable procedures and treating other patients. Critical care was necessary to treat or prevent imminent or life-threatening deterioration. Critical care was time spent personally by me on the following activities: development of treatment plan with patient and/or surrogate as well as nursing, discussions with consultants, evaluation of patient's response to treatment, examination of patient, obtaining history from patient or surrogate, ordering and performing treatments and interventions, ordering and review of laboratory studies, ordering and review of radiographic studies, pulse oximetry and re-evaluation of patient's condition.   Labs, studies and imaging reviewed by myself and considered in medical decision making if ordered. Imaging interpreted by radiology.  Labs Reviewed  ETHANOL - Abnormal; Notable for the following components:      Result Value   Alcohol, Ethyl (B) 33 (*)    All other components within normal limits  DIFFERENTIAL - Abnormal; Notable for the following components:   Monocytes  Absolute 1.1 (*)    All other components within normal limits  COMPREHENSIVE METABOLIC PANEL - Abnormal; Notable for the following components:   Glucose, Bld 104 (*)    Albumin 3.4 (*)    All other components within normal limits  RAPID URINE DRUG SCREEN, HOSP PERFORMED - Abnormal; Notable for the following components:   Cocaine POSITIVE (*)    All other components within normal limits  URINALYSIS, ROUTINE W REFLEX MICROSCOPIC - Abnormal; Notable for the following components:   APPearance CLOUDY (*)    Specific Gravity, Urine 1.033 (*)    All other components within normal limits  I-STAT CHEM 8, ED - Abnormal; Notable for the following components:   Glucose, Bld 100 (*)    Calcium, Ion 1.11 (*)    Hemoglobin 18.0 (*)    HCT 53.0 (*)    All other components within normal limits  RESP PANEL BY RT-PCR (FLU A&B, COVID) ARPGX2  PROTIME-INR  APTT  CBC  CBG MONITORING, ED  TROPONIN I (HIGH SENSITIVITY)  TROPONIN I (HIGH SENSITIVITY)    MR BRAIN WO CONTRAST  Final Result    CT CEREBRAL PERFUSION W CONTRAST  Final Result    CT Angio Chest/Abd/Pel for Dissection W and/or W/WO  Final Result    CT ANGIO NECK CODE STROKE  Final Result    CT ANGIO HEAD CODE STROKE  Final Result    CT HEAD CODE STROKE WO CONTRAST  Final Result      No follow-ups on file.    Lylia Karn, Corene Cornea, MD 10/13/21 [phone]

## 2021-10-13 NOTE — Progress Notes (Signed)
EEG completed, results pending. 

## 2021-10-13 NOTE — ED Notes (Signed)
Report given to Cleotis Lema, RN receiving pt.

## 2021-10-13 NOTE — Progress Notes (Signed)
Same day note  Patient seen and examined at bedside.  Patient was admitted to the hospital for right-sided weakness.  At the time of my evaluation, patient complains of right-sided mild weakness slightly improving.  Vision has improved on the right side  Physical examination reveals right-sided handgrip  weakness noted.  Alert awake and communicative.  Laboratory data and imaging was reviewed  Assessment and Plan.  Right-sided weakness  Possible TIA.  EEG was ordered which does not show seizure-like activity.  Stroke work-up in progress.  Currently on aspirin.  2D echocardiogram showed LV ejection fraction of 55 to 60%.  Hemoglobin A1c of 6.1.  Lipid panel reviewed with LDL cholesterol of 68. Check physical therapy.  Continue Dilantin.  Hypertensive urgency  On permissive hypertension.  We will continue to monitor.  Lung mass being followed by pulmonologist.  Emphysema recently placed on Spiriva.  On 2 L oxygen at this time.  Seizure disorder on Dilantin.  Tobacco alcohol and cocaine use.  TOC consulted   Spoke with the patient's wife at bedside.  No Charge  Signed,  Delila Pereyra, MD [facility]

## 2021-10-14 ENCOUNTER — Telehealth: Payer: Self-pay | Admitting: Pulmonary Disease

## 2021-10-14 DIAGNOSIS — R0902 Hypoxemia: Secondary | ICD-10-CM

## 2021-10-14 DIAGNOSIS — R569 Unspecified convulsions: Secondary | ICD-10-CM

## 2021-10-14 DIAGNOSIS — W19XXXA Unspecified fall, initial encounter: Secondary | ICD-10-CM

## 2021-10-14 DIAGNOSIS — R918 Other nonspecific abnormal finding of lung field: Secondary | ICD-10-CM

## 2021-10-14 LAB — PHENYTOIN LEVEL, TOTAL: Phenytoin Lvl: <2.5 ug/mL — ABNORMAL LOW (ref 10.0–20.0)

## 2021-10-14 MED ORDER — VITAMIN B-12 1000 MCG PO TABS: 1000.0000 ug | ORAL_TABLET | Freq: Every day | ORAL | Status: DC

## 2021-10-14 MED ORDER — ASPIRIN 325 MG PO TBEC: 325.0000 mg | DELAYED_RELEASE_TABLET | Freq: Every day | ORAL | 2 refills | Status: AC

## 2021-10-14 MED ORDER — FOLIC ACID 1 MG PO TABS: 1.0000 mg | ORAL_TABLET | Freq: Every day | ORAL | 0 refills | Status: DC

## 2021-10-14 MED ORDER — UMECLIDINIUM BROMIDE 62.5 MCG/ACT IN AEPB
1.0000 | INHALATION_SPRAY | Freq: Every day | RESPIRATORY_TRACT | Status: DC
  Administered 2021-10-14: 1 via RESPIRATORY_TRACT
  Filled 2021-10-14: qty 7

## 2021-10-14 MED ORDER — THIAMINE HCL 100 MG PO TABS: 100.0000 mg | ORAL_TABLET | Freq: Every day | ORAL | 0 refills | Status: DC

## 2021-10-14 MED ORDER — LEVETIRACETAM 500 MG PO TABS: 500.0000 mg | ORAL_TABLET | Freq: Two times a day (BID) | ORAL | 2 refills | Status: DC

## 2021-10-14 MED ORDER — CLONIDINE HCL 0.1 MG PO TABS
0.3000 mg | ORAL_TABLET | Freq: Two times a day (BID) | ORAL | Status: DC
  Administered 2021-10-14: 0.3 mg via ORAL
  Filled 2021-10-14: qty 3

## 2021-10-14 MED ORDER — ADULT MULTIVITAMIN W/MINERALS CH: 1.0000 | ORAL_TABLET | Freq: Every day | ORAL | 0 refills | Status: DC

## 2021-10-14 MED ORDER — LOSARTAN POTASSIUM 25 MG PO TABS
25.0000 mg | ORAL_TABLET | Freq: Every day | ORAL | Status: DC
  Administered 2021-10-14: 25 mg via ORAL
  Filled 2021-10-14: qty 1

## 2021-10-14 MED ORDER — LEVETIRACETAM IN NACL 1000 MG/100ML IV SOLN
1000.0000 mg | INTRAVENOUS | Status: AC
  Administered 2021-10-14: 1000 mg via INTRAVENOUS
  Filled 2021-10-14: qty 100

## 2021-10-14 MED ORDER — LEVETIRACETAM 500 MG PO TABS: 500.0000 mg | ORAL_TABLET | Freq: Two times a day (BID) | ORAL | Status: DC

## 2021-10-14 MED ORDER — TIOTROPIUM BROMIDE MONOHYDRATE 2.5 MCG/ACT IN AERS: 2.0000 | INHALATION_SPRAY | Freq: Every day | RESPIRATORY_TRACT | Status: DC

## 2021-10-14 NOTE — Progress Notes (Signed)
Discharge instructions gone over with patient. All questions answered. Pt home with oxygen tanks and rolling walker. Clothing returned to patient. IV removed and telemetry discontinued. Pt wheeled off unit for transport home by wife.   Gwendolyn Grant, RN

## 2021-10-14 NOTE — Progress Notes (Addendum)
PROGRESS NOTE  Richard [name] LOV:[id number] DOB: 26-Oct-1971 DOA: 10/12/2021 PCP: Patient, No Pcp Per (Inactive)   LOS: 1 day   Brief narrative: Richard [name] is a 50 y.o. male with history of hypertension, prior TIA, emphysema, lung mass, tobacco and alcohol use was brought into the hospital after he suddenly lost his consciousness and when he woke up he was weak on the right side.  In the ED a CT scan of the head was performed followed by CT perfusion study, CTA.  Neurology was consulted.  MRI of the brain was negative for acute stroke.    CT angio does show the known history of lung mass which is being followed by oncologist.  Troponins were negative including EKG.  Drug screen was positive for cocaine.  COVID test was negative.  He continued to have right-sided weakness of patient was admitted hospital for further evaluation and treatment.    Assessment/Plan:  Principal Problem:   TIA (transient ischemic attack) Active Problems:   Hypertensive urgency   Seizure disorder [facility])   Essential hypertension   Right sided weakness   Assessment and Plan.  Right-sided weakness, history of seizure disorder. Possibilities include possible TIA versus Todd's paralysis.  Dilantin level was subtherapeutic likely to Todd's paralysis.  EEG was ordered which does not show seizure-like activity.  MRI of the brain did not show any acute process.  Continue aspirin daily.  2D echocardiogram showed LV ejection fraction of 55 to 60%.  Hemoglobin A1c of 6.1.  Lipid panel reviewed with LDL cholesterol of 68.  Physical therapy has seen the patient and recommended home physical therapy on discharge.  Hypertensive urgency  No evidence of stroke on the MRI.  We will focus on adequate blood pressure control.  Compliance was emphasized.  Discharge patient on home medication regimen including clonidine, losartan.  Emphysema/lung mass.  Followed by pulmonary as outpatient.  Recently placed on Spiriva.  Currently on room  air.  Has qualified for home oxygen on discharge.  Seizure disorder on Dilantin at home but levels very low likely noncompliance.Marland Kitchen  Spoke with neurology about it.  Neurology recommends Keppra loading followed by Keppra twice daily on discharge.  Polysubstance abuse.  History of tobacco alcohol and cocaine use.  TOC consulted.  Continue thiamine folic acid.  Continue multivitamin.  Patient was counseled against it  Obesity.  Present on admission.  Would benefit from weight loss as outpatient.  DVT prophylaxis: enoxaparin (LOVENOX) injection 40 mg Start: 10/13/21 1000  Code Status: Full code  Family Communication: Spoke with the patient's wife at bedside yesterday  Status is: Inpatient  Remains inpatient appropriate because: Need for further monitoring, right-sided weakness, Keppra loading by neurology.   Consultants: Neurology  Procedures: EEG  Anti-infectives:  None  Anti-infectives (From admission, onward)    None      Subjective: Today, patient was seen and examined at bedside.  Complains of right-sided weakness.  Denies any headache, nausea, vomiting, chest pain or shortness of breath.  Objective: Vitals:   10/14/21 0756 10/14/21 1103  BP: (!) 170/113 (!) 156/122  Pulse: 92 77  Resp: 20 20  Temp: 98.8 F (37.1 C) 98.3 F (36.8 C)  SpO2: 96% 92%    Intake/Output Summary (Last 24 hours) at 10/14/2021 1306 Last data filed at 10/14/2021 0839 Gross per 24 hour  Intake 1519.27 ml  Output 1100 ml  Net 419.27 ml   Filed Weights   10/12/21 2017  Weight: 101.6 kg   Body mass index is 33.08  kg/m.   Physical Exam: GENERAL: Patient is alert awake and oriented. Not in obvious distress.  Obese HENT: No scleral pallor or icterus. Pupils equally reactive to light. Oral mucosa is moist NECK: is supple, no gross swelling noted. CHEST: Clear to auscultation. No crackles or wheezes.  Diminished breath sounds bilaterally. CVS: S1 and S2 heard, no murmur. Regular rate  and rhythm.  ABDOMEN: Soft, non-tender, bowel sounds are present. EXTREMITIES: No edema.  Mild right-sided weakness. CNS: Cranial nerves are intact.  Mild right-sided weakness. SKIN: warm and dry without rashes.  Data Review: I have personally reviewed the following laboratory data and studies,  CBC: Recent Labs  Lab 10/12/21 1800 10/12/21 1807 10/13/21 0643  WBC 9.2  --  10.5  NEUTROABS 6.3  --   --   HGB 16.7 18.0* 16.2  HCT 50.9 53.0* 50.4  MCV 92.7  --  94.4  PLT 319  --  001   Basic Metabolic Panel: Recent Labs  Lab 10/12/21 1800 10/12/21 1807 10/13/21 0643  NA 139 142 134*  K 4.3 4.1 4.5  CL 108 107 102  CO2 22  --  23  GLUCOSE 104* 100* 110*  BUN 6 6 8   CREATININE 0.88 0.80 0.96  CALCIUM 9.1  --  9.0   Liver Function Tests: Recent Labs  Lab 10/12/21 1800 10/13/21 0643  AST 16 23  ALT 12 14  ALKPHOS 79 81  BILITOT 0.5 1.1  PROT 7.2 6.9  ALBUMIN 3.4* 3.2*   No results for input(s): LIPASE, AMYLASE in the last 168 hours. No results for input(s): AMMONIA in the last 168 hours. Cardiac Enzymes: No results for input(s): CKTOTAL, CKMB, CKMBINDEX, TROPONINI in the last 168 hours. BNP (last 3 results) No results for input(s): BNP in the last 8760 hours.  ProBNP (last 3 results) No results for input(s): PROBNP in the last 8760 hours.  CBG: Recent Labs  Lab 10/12/21 1756  GLUCAP 98   Recent Results (from the past 240 hour(s))  Resp Panel by RT-PCR (Flu A&B, Covid) Nasopharyngeal Swab     Status: None   Collection Time: 10/12/21  6:02 PM   Specimen: Nasopharyngeal Swab; Nasopharyngeal(NP) swabs in vial transport medium  Result Value Ref Range Status   SARS Coronavirus 2 by RT PCR NEGATIVE NEGATIVE Final    Comment: (NOTE) SARS-CoV-2 target nucleic acids are NOT DETECTED.  The SARS-CoV-2 RNA is generally detectable in upper respiratory specimens during the acute phase of infection. The lowest concentration of SARS-CoV-2 viral copies this assay can  detect is 138 copies/mL. A negative result does not preclude SARS-Cov-2 infection and should not be used as the sole basis for treatment or other patient management decisions. A negative result may occur with  improper specimen collection/handling, submission of specimen other than nasopharyngeal swab, presence of viral mutation(s) within the areas targeted by this assay, and inadequate number of viral copies(<138 copies/mL). A negative result must be combined with clinical observations, patient history, and epidemiological information. The expected result is Negative.  Fact Sheet for Patients:  EntrepreneurPulse.com.au  Fact Sheet for Healthcare Providers:  IncredibleEmployment.be  This test is no t yet approved or cleared by the [place] FDA and  has been authorized for detection and/or diagnosis of SARS-CoV-2 by FDA under an Emergency Use Authorization (EUA). This EUA will remain  in effect (meaning this test can be used) for the duration of the COVID-19 declaration under Section 564(b)(1) of the Act, 21 U.S.C.section 360bbb-3(b)(1), unless the authorization is terminated  or revoked sooner.       Influenza A by PCR NEGATIVE NEGATIVE Final   Influenza B by PCR NEGATIVE NEGATIVE Final    Comment: (NOTE) The Xpert Xpress SARS-CoV-2/FLU/RSV plus assay is intended as an aid in the diagnosis of influenza from Nasopharyngeal swab specimens and should not be used as a sole basis for treatment. Nasal washings and aspirates are unacceptable for Xpert Xpress SARS-CoV-2/FLU/RSV testing.  Fact Sheet for Patients: EntrepreneurPulse.com.au  Fact Sheet for Healthcare Providers: IncredibleEmployment.be  This test is not yet approved or cleared by the [place] FDA and has been authorized for detection and/or diagnosis of SARS-CoV-2 by FDA under an Emergency Use Authorization (EUA). This EUA will remain in  effect (meaning this test can be used) for the duration of the COVID-19 declaration under Section 564(b)(1) of the Act, 21 U.S.C. section 360bbb-3(b)(1), unless the authorization is terminated or revoked.  Performed at [facility], [place] [address] [place], [place] [address]      Studies: DG Pelvis 1-2 Views  Result Date: 10/13/2021 CLINICAL DATA:  Fall EXAM: PELVIS - 1-2 VIEW COMPARISON:  None similar FINDINGS: No hip dislocation or fracture. Intact pelvic ring. Sclerosis over the right ilium is a large bone island based on PET CT in 2021 IMPRESSION: No acute finding. Electronically Signed   By: Jorje Guild M.D.   On: 10/13/2021 07:31   MR BRAIN WO CONTRAST  Result Date: 10/13/2021 CLINICAL DATA:  Initial evaluation for acute TIA, right-sided weakness. EXAM: MRI HEAD WITHOUT CONTRAST TECHNIQUE: Multiplanar, multiecho pulse sequences of the brain and surrounding structures were obtained without intravenous contrast. COMPARISON:  Prior CTs from 10/12/2021. FINDINGS: Brain: Diffuse prominence of the CSF containing spaces compatible generalized cerebral atrophy. Associated diffuse prominence of the perivascular spaces. Patchy and confluent T2/FLAIR hyperintensity within the periventricular deep white matter both cerebral hemispheres as well as the pons, most consistent with chronic small vessel ischemic disease, moderate in nature. Chronic encephalomalacia and gliosis involving the anterior left frontal lobe again noted, which could be related to prior infarct and/or trauma. No abnormal foci of restricted diffusion to suggest acute or subacute ischemia. Gray-white matter differentiation maintained. No encephalomalacia to suggest chronic cortical infarction elsewhere within the brain. No acute intracranial hemorrhage. Few scattered punctate chronic micro hemorrhages noted, likely small vessel related. No mass lesion, midline shift or mass effect. No hydrocephalus or extra-axial fluid  collection. Pituitary gland suprasellar region within normal limits. Midline structures intact. Vascular: Major intracranial vascular flow voids are maintained. Skull and upper cervical spine: Craniocervical junction within normal limits. Bone marrow signal intensity normal. No scalp soft tissue abnormality. Sinuses/Orbits: Globes and orbital soft tissues within normal limits. Chronic left maxillary sinusitis noted. Paranasal sinuses are otherwise clear. No mastoid effusion. Other: None. IMPRESSION: 1. No acute intracranial infarct or other abnormality. 2. Chronic encephalomalacia and gliosis involving the anterior left frontal lobe, stable. 3. Underlying atrophy with moderate chronic microvascular ischemic disease. 4. Chronic left maxillary sinusitis. Electronically Signed   By: Jeannine Boga M.D.   On: 10/13/2021 03:00   CT CEREBRAL PERFUSION W CONTRAST  Result Date: 10/12/2021 CLINICAL DATA:  Initial evaluation for neuro deficit, stroke suspected. EXAM: CT PERFUSION BRAIN TECHNIQUE: Multiphase CT imaging of the brain was performed following IV bolus contrast injection. Subsequent parametric perfusion maps were calculated using RAPID software. CONTRAST:  51mL OMNIPAQUE IOHEXOL 350 MG/ML SOLN COMPARISON:  Prior CT and CTA from earlier the same day. FINDINGS: CT Brain Perfusion Findings: CBF (<30%) Volume: 34mL  Perfusion (Tmax>6.0s) volume: 30mL Mismatch Volume: -18mL ASPECTS on noncontrast CT Head: 10 at 6:07 p.m. today. Infarct Core: No acute core infarct. Infarction Location:Matched perfusion defect at the anterior left frontal convexity, corresponding with area of chronic encephalomalacia a seen on prior CT. No other acute core infarct or other perfusion deficit. IMPRESSION: 1. Negative CT perfusion with no evidence for acute ischemia or other perfusion deficit. 2. 3 mL matched perfusion defect at the anterior left frontal convexity, corresponding with area of chronic encephalomalacia seen on prior CT.  Electronically Signed   By: Jeannine Boga M.D.   On: 10/12/2021 19:09   EEG adult  Result Date: 10/13/2021 Lora Havens, MD     10/13/2021 12:57 PM Patient Name: Richard [name] MRN: [id number] Epilepsy Attending: Lora Havens Referring Physician/Provider: Dr Gean Birchwood Date: 10/13/2021 Duration: 21.49 mins Patient history: 50 year old male with history of seizures will presented with right-sided weakness.  EEG to evaluate for seizure. Level of alertness: Awake AEDs during EEG study: Dilantin Technical aspects: This EEG study was done with scalp electrodes positioned according to the 10-20 International system of electrode placement. Electrical activity was acquired at a sampling rate of 500Hz  and reviewed with a high frequency filter of 70Hz  and a low frequency filter of 1Hz . EEG data were recorded continuously and digitally stored. Description: The posterior dominant rhythm consists of 8-9 Hz activity of moderate voltage (25-35 uV) seen predominantly in posterior head regions, symmetric and reactive to eye opening and eye closing. Hyperventilation and photic stimulation were not performed.   IMPRESSION: This study is within normal limits. No seizures or epileptiform discharges were seen throughout the recording. Lora Havens   ECHOCARDIOGRAM COMPLETE  Result Date: 10/13/2021    ECHOCARDIOGRAM REPORT   Patient Name:   Richard [name] Date of Exam: 10/13/2021 Medical Rec #:  [id number]    Height:       69.0 in Accession #:    [id number]   Weight:       224.0 lb Date of Birth:  1971-09-24    BSA:          2.168 m Patient Age:    49 years     BP:           155/106 mmHg Patient Gender: M            HR:           76 bpm. Exam Location:  Inpatient Procedure: 2D Echo, Color Doppler and Cardiac Doppler Indications:    TIA  History:        Patient has prior history of Echocardiogram examinations. Risk                 Factors:Hypertension.  Sonographer:    Jyl Heinz Referring Phys: [place]  1. Left ventricular ejection fraction, by estimation, is 55 to 60%. The left ventricle has normal function. The left ventricle has no regional wall motion abnormalities. There is moderate left ventricular hypertrophy of the basal-septal segment. Left ventricular diastolic parameters are consistent with Grade I diastolic dysfunction (impaired relaxation).  2. Right ventricular systolic function is normal. The right ventricular size is normal. Tricuspid regurgitation signal is inadequate for assessing PA pressure.  3. The mitral valve is normal in structure. No evidence of mitral valve regurgitation. No evidence of mitral stenosis.  4. The aortic valve is normal in structure. Aortic valve regurgitation is mild to moderate. No aortic stenosis is present. Aortic regurgitation PHT measures  402 msec.  5. Aortic dilatation noted. There is mild dilatation of the aortic root, measuring 39 mm.  6. The inferior vena cava is dilated in size with >50% respiratory variability, suggesting right atrial pressure of 8 mmHg. Conclusion(s)/Recommendation(s): No intracardiac source of embolism detected on this transthoracic study. A transesophageal echocardiogram is recommended to exclude cardiac source of embolism if clinically indicated. FINDINGS  Left Ventricle: Left ventricular ejection fraction, by estimation, is 55 to 60%. The left ventricle has normal function. The left ventricle has no regional wall motion abnormalities. The left ventricular internal cavity size was normal in size. There is  moderate left ventricular hypertrophy of the basal-septal segment. Left ventricular diastolic parameters are consistent with Grade I diastolic dysfunction (impaired relaxation). Normal left ventricular filling pressure. Right Ventricle: The right ventricular size is normal. No increase in right ventricular wall thickness. Right ventricular systolic function is normal. Tricuspid regurgitation signal is inadequate for  assessing PA pressure. Left Atrium: Left atrial size was normal in size. Right Atrium: Right atrial size was normal in size. Pericardium: There is no evidence of pericardial effusion. Mitral Valve: The mitral valve is normal in structure. No evidence of mitral valve regurgitation. No evidence of mitral valve stenosis. Tricuspid Valve: The tricuspid valve is normal in structure. Tricuspid valve regurgitation is trivial. No evidence of tricuspid stenosis. Aortic Valve: The aortic valve is normal in structure. Aortic valve regurgitation is mild to moderate. Aortic regurgitation PHT measures 402 msec. No aortic stenosis is present. Aortic valve peak gradient measures 7.1 mmHg. Pulmonic Valve: The pulmonic valve was normal in structure. Pulmonic valve regurgitation is trivial. No evidence of pulmonic stenosis. Aorta: Aortic dilatation noted. There is mild dilatation of the aortic root, measuring 39 mm. Venous: The inferior vena cava is dilated in size with greater than 50% respiratory variability, suggesting right atrial pressure of 8 mmHg. IAS/Shunts: The interatrial septum appears to be lipomatous. No atrial level shunt detected by color flow Doppler.  LEFT VENTRICLE PLAX 2D LVIDd:         4.70 cm      Diastology LVIDs:         3.70 cm      LV e' medial:    6.09 cm/s LV PW:         1.30 cm      LV E/e' medial:  7.7 LV IVS:        1.60 cm      LV e' lateral:   9.14 cm/s LVOT diam:     2.50 cm      LV E/e' lateral: 5.2 LV SV:         101 LV SV Index:   46 LVOT Area:     4.91 cm  LV Volumes (MOD) LV vol d, MOD A2C: 105.0 ml LV vol d, MOD A4C: 131.0 ml LV vol s, MOD A2C: 44.6 ml LV vol s, MOD A4C: 54.0 ml LV SV MOD A2C:     60.4 ml LV SV MOD A4C:     131.0 ml LV SV MOD BP:      68.9 ml RIGHT VENTRICLE             IVC RV Basal diam:  3.40 cm     IVC diam: 2.50 cm RV Mid diam:    2.90 cm RV S prime:     13.60 cm/s TAPSE (M-mode): 1.9 cm LEFT ATRIUM             Index  RIGHT ATRIUM           Index LA diam:        3.70  cm 1.71 cm/m   RA Area:     12.40 cm LA Vol (A2C):   37.7 ml 17.39 ml/m  RA Volume:   25.80 ml  11.90 ml/m LA Vol (A4C):   30.0 ml 13.84 ml/m LA Biplane Vol: 33.7 ml 15.54 ml/m  AORTIC VALVE AV Area (Vmax): 3.93 cm AV Vmax:        133.50 cm/s AV Peak Grad:   7.1 mmHg LVOT Vmax:      107.00 cm/s LVOT Vmean:     80.250 cm/s LVOT VTI:       0.205 m AI PHT:         402 msec  AORTA Ao Root diam: 3.90 cm MITRAL VALVE MV Area (PHT): 3.43 cm    SHUNTS MV Decel Time: 221 msec    Systemic VTI:  0.20 m MV E velocity: 47.10 cm/s  Systemic Diam: 2.50 cm MV A velocity: 53.40 cm/s MV E/A ratio:  0.88 Fransico Him MD Electronically signed by Fransico Him MD Signature Date/Time: 10/13/2021/9:51:14 AM    Final    CT Angio Chest/Abd/Pel for Dissection W and/or W/WO  Result Date: 10/12/2021 CLINICAL DATA:  50 year old male with chest, back and abdominal pain. EXAM: CT ANGIOGRAPHY CHEST, ABDOMEN AND PELVIS TECHNIQUE: Non-contrast CT of the chest was initially obtained. Multidetector CT imaging through the chest, abdomen and pelvis was performed using the standard protocol during bolus administration of intravenous contrast. Multiplanar reconstructed images and MIPs were obtained and reviewed to evaluate the vascular anatomy. CONTRAST:  32mL OMNIPAQUE IOHEXOL 350 MG/ML SOLN COMPARISON:  04/15/2020 PET CT and prior studies FINDINGS: CTA CHEST FINDINGS Cardiovascular: Preferential opacification of the thoracic aorta. No evidence of thoracic aortic aneurysm or dissection. Normal heart size. No pericardial effusion. Mediastinum/Nodes: Enlarged RIGHT hilar, RIGHT paratracheal and subcarinal lymph nodes are noted with index nodes including a 1.6 cm RIGHT paratracheal node (series 6: Image 51), a 1.6 cm subcarinal node (6:64). The trachea, visualized thyroid and esophagus are unremarkable. Lungs/Pleura: A 7.5 cm irregular mass is identified within the central/perihilar RIGHT lung along both sides of the major fissure and  involving both the RIGHT UPPER and RIGHT LOWER lobes compatible with malignancy. This mass extends into the mediastinum. A 2.5 cm satellite mass within the RIGHT UPPER lobe is noted (8:52). The LEFT lung is clear. No pleural effusion or pneumothorax. Musculoskeletal: No acute or suspicious bony abnormalities are noted. Review of the MIP images confirms the above findings. CTA ABDOMEN AND PELVIS FINDINGS VASCULAR Aorta: Aortic atherosclerotic calcifications noted. Normal caliber aorta without aneurysm, dissection, vasculitis or significant stenosis. Celiac: Patent without evidence of aneurysm, dissection, vasculitis or significant stenosis. SMA: Patent without evidence of aneurysm, dissection, vasculitis or significant stenosis. Renals: Both renal arteries are patent without evidence of aneurysm, dissection, vasculitis, fibromuscular dysplasia or significant stenosis. IMA: Patent without evidence of aneurysm, dissection, vasculitis or significant stenosis. Inflow: Patent without evidence of aneurysm, dissection, vasculitis or significant stenosis. Veins: No obvious venous abnormality within the limitations of this arterial phase study. Review of the MIP images confirms the above findings. NON-VASCULAR Hepatobiliary: The liver and gallbladder are unremarkable. No biliary dilatation. Pancreas: Unremarkable Spleen: Unremarkable Adrenals/Urinary Tract: The kidneys and adrenal glands are unremarkable except for stable bilateral adrenal adenomas and a RIGHT renal cyst. Stomach/Bowel: Stomach is within normal limits. Appendix appears normal. No evidence of bowel wall thickening, distention, or  inflammatory changes. Lymphatic: No abnormal lymph nodes are noted within the abdomen or pelvis. Reproductive: Prostate is unremarkable. Other: No ascites, focal collection or pneumoperitoneum. Bilateral hydroceles are present. Musculoskeletal: No acute or suspicious bony abnormalities are identified. Degenerative changes at L5-S1  again noted as well as a RIGHT iliac bone island. Review of the MIP images confirms the above findings. IMPRESSION: 1. 7.5 cm central/perihilar RIGHT lung malignancy involving both the RIGHT UPPER and RIGHT LOWER lobes, with some extension into the mediastinum. 2.5 cm satellite mass within the RIGHT UPPER lobe. Mildly enlarged RIGHT hilar, RIGHT paratracheal and subcarinal lymph nodes likely representing lymphatic spread. 2. No evidence of aortic aneurysm or dissection. 3. No acute abnormality within the abdomen or pelvis. 4. Stable bilateral adrenal adenomas. 5. Bilateral hydroceles. Electronically Signed   By: Margarette Canada M.D.   On: 10/12/2021 18:48   CT HEAD CODE STROKE WO CONTRAST  Result Date: 10/12/2021 CLINICAL DATA:  Code stroke. Neuro deficit, acute, stroke suspected. Right upper and lower extremity numbness and weakness. Right facial numbness. EXAM: CT HEAD WITHOUT CONTRAST TECHNIQUE: Contiguous axial images were obtained from the base of the skull through the vertex without intravenous contrast. COMPARISON:  CT head without contrast 07/11/2020. FINDINGS: Brain: Chronic encephalomalacia of the anterior left frontal lobe is again noted. No acute cortical infarct is present. Basal ganglia are intact. Insular ribbon is within normal limits bilaterally. Diffuse white matter changes are stable. The ventricles are of normal size. No significant extraaxial fluid collection is present. Insert normal brainstem Vascular: No unexpected calcifications are hyperdense vessel is present. Skull: Calvarium is intact. No focal lytic or blastic lesions are present. Sinuses/Orbits: The paranasal sinuses and mastoid air cells are clear. The globes and orbits are within normal limits. ASPECTS [facility] Stroke Program Early CT Score) - Ganglionic level infarction (caudate, lentiform nuclei, internal capsule, insula, M1-M3 cortex): 7/7 - Supraganglionic infarction (M4-M6 cortex): 3/3 Total score (0-10 with 10 being normal):  10/10 IMPRESSION: 1. No acute intracranial abnormality or significant interval change. Aspects 10/10 2. Stable chronic encephalomalacia of the anterior left frontal lobe. 3. Stable diffuse white matter disease. These results were called by telephone at the time of interpretation on 10/12/2021 at 6:16 pm to provider [facility] , who verbally acknowledged these results. Electronically Signed   By: San Morelle M.D.   On: 10/12/2021 18:16   CT ANGIO HEAD CODE STROKE  Result Date: 10/12/2021 CLINICAL DATA:  Stroke.  New right-sided weakness. EXAM: CT ANGIOGRAPHY HEAD AND NECK TECHNIQUE: Multidetector CT imaging of the head and neck was performed using the standard protocol during bolus administration of intravenous contrast. Multiplanar CT image reconstructions and MIPs were obtained to evaluate the vascular anatomy. Carotid stenosis measurements (when applicable) are obtained utilizing NASCET criteria, using the distal internal carotid diameter as the denominator. CONTRAST:  170mL OMNIPAQUE IOHEXOL 350 MG/ML SOLN COMPARISON:  CT head without contrast 09/12/2021. FINDINGS: CTA NECK FINDINGS Aortic arch: The left common carotid artery and innominate artery share a common origin. Aortic arch is otherwise unremarkable. No stenosis or aneurysm is present. Right carotid system: The right common carotid artery is within normal limits. Imaging the bifurcation somewhat degraded by motion. No significant stenosis is present. The cervical right ICA is within normal limits. Left carotid system: The left common carotid artery is within normal limits. Images the bifurcation is somewhat degraded by patient motion. Cervical left ICA is within normal limits. Vertebral arteries: The left vertebral artery is the dominant vessel. Both vertebral arteries originate  from the subclavian arteries. No significant stenosis is present in either vertebral artery in the neck. Skeleton: Vertebral body heights are maintained. Mild  endplate changes are noted. No focal osseous abnormalities are present. Extensive dental disease is noted. Other neck: Soft tissues the neck are otherwise unremarkable. Salivary glands are within normal limits. Thyroid is normal. No significant adenopathy is present. No focal mucosal or submucosal lesions are present. Upper chest: Centrilobular emphysematous changes are present. No nodule or mass lesion is present. No significant pleural disease is present. Thoracic inlet is within normal limits. Review of the MIP images confirms the above findings CTA HEAD FINDINGS Anterior circulation: The internal carotid arteries are within normal limits from the high cervical segments through the ICA termini. The A1 and M1 segments are normal. MCA bifurcations are within normal limits. No significant proximal stenosis is present. Diffuse segmental narrowing is present at the distal ACA and MCA branch vessels bilaterally. Posterior circulation: The left vertebral artery is the dominant vessel. PICA origins are visualized and within normal limits. Vertebrobasilar junction is normal. The basilar artery is normal. Both posterior cerebral arteries originate from the basilar tip. No significant proximal stenosis is present. There is moderate narrowing of distal PCA branch vessels. Venous sinuses: The dural sinuses are patent. The straight sinus deep cerebral veins are intact. Cortical veins are within normal limits. No significant vascular malformation is evident. Anatomic variants: None Review of the MIP images confirms the above findings IMPRESSION: 1. No emergent large vessel occlusion. 2. Diffuse segmental narrowing of the distal ACA and MCA branch vessels bilaterally without a significant proximal stenosis, aneurysm, or branch vessel occlusion. Findings are compatible with intracranial atherosclerotic change. 3. Extensive dental disease. Electronically Signed   By: San Morelle M.D.   On: 10/12/2021 18:29   CT ANGIO  NECK CODE STROKE  Result Date: 10/12/2021 CLINICAL DATA:  Stroke.  New right-sided weakness. EXAM: CT ANGIOGRAPHY HEAD AND NECK TECHNIQUE: Multidetector CT imaging of the head and neck was performed using the standard protocol during bolus administration of intravenous contrast. Multiplanar CT image reconstructions and MIPs were obtained to evaluate the vascular anatomy. Carotid stenosis measurements (when applicable) are obtained utilizing NASCET criteria, using the distal internal carotid diameter as the denominator. CONTRAST:  159mL OMNIPAQUE IOHEXOL 350 MG/ML SOLN COMPARISON:  CT head without contrast 09/12/2021. FINDINGS: CTA NECK FINDINGS Aortic arch: The left common carotid artery and innominate artery share a common origin. Aortic arch is otherwise unremarkable. No stenosis or aneurysm is present. Right carotid system: The right common carotid artery is within normal limits. Imaging the bifurcation somewhat degraded by motion. No significant stenosis is present. The cervical right ICA is within normal limits. Left carotid system: The left common carotid artery is within normal limits. Images the bifurcation is somewhat degraded by patient motion. Cervical left ICA is within normal limits. Vertebral arteries: The left vertebral artery is the dominant vessel. Both vertebral arteries originate from the subclavian arteries. No significant stenosis is present in either vertebral artery in the neck. Skeleton: Vertebral body heights are maintained. Mild endplate changes are noted. No focal osseous abnormalities are present. Extensive dental disease is noted. Other neck: Soft tissues the neck are otherwise unremarkable. Salivary glands are within normal limits. Thyroid is normal. No significant adenopathy is present. No focal mucosal or submucosal lesions are present. Upper chest: Centrilobular emphysematous changes are present. No nodule or mass lesion is present. No significant pleural disease is present.  Thoracic inlet is within normal limits. Review of  the MIP images confirms the above findings CTA HEAD FINDINGS Anterior circulation: The internal carotid arteries are within normal limits from the high cervical segments through the ICA termini. The A1 and M1 segments are normal. MCA bifurcations are within normal limits. No significant proximal stenosis is present. Diffuse segmental narrowing is present at the distal ACA and MCA branch vessels bilaterally. Posterior circulation: The left vertebral artery is the dominant vessel. PICA origins are visualized and within normal limits. Vertebrobasilar junction is normal. The basilar artery is normal. Both posterior cerebral arteries originate from the basilar tip. No significant proximal stenosis is present. There is moderate narrowing of distal PCA branch vessels. Venous sinuses: The dural sinuses are patent. The straight sinus deep cerebral veins are intact. Cortical veins are within normal limits. No significant vascular malformation is evident. Anatomic variants: None Review of the MIP images confirms the above findings IMPRESSION: 1. No emergent large vessel occlusion. 2. Diffuse segmental narrowing of the distal ACA and MCA branch vessels bilaterally without a significant proximal stenosis, aneurysm, or branch vessel occlusion. Findings are compatible with intracranial atherosclerotic change. 3. Extensive dental disease. Electronically Signed   By: San Morelle M.D.   On: 10/12/2021 18:29      Flora Lipps, MD  [facility] 10/14/2021  If 7PM-7AM, please contact night-coverage

## 2021-10-14 NOTE — TOC CAGE-AID Note (Signed)
Transition of Care [facility]) - CAGE-AID Screening   Patient Details  Name: Hubbert Landrigan MRN: [id number] Date of Birth: 1971/11/25  [facility]) CM/SW Contact:    Pollie Friar, RN Phone Number: 10/14/2021, 10:42 AM   Clinical Narrative: CM provided patient with inpatient/ outpatient alcohol counseling resources.    CAGE-AID Screening:    Have You Ever Felt You Ought to Cut Down on Your Drinking or Drug Use?: Yes Have People Annoyed You By Critizing Your Drinking Or Drug Use?: Yes Have You Felt Bad Or Guilty About Your Drinking Or Drug Use?: Yes Have You Ever Had a Drink or Used Drugs First Thing In The Morning to Steady Your Nerves or to Get Rid of a Hangover?: No CAGE-AID Score: 3  Substance Abuse Education Offered: Yes  Substance abuse interventions: Patient Counseling

## 2021-10-14 NOTE — Progress Notes (Signed)
SATURATION QUALIFICATIONS: (This note is used to comply with regulatory documentation for home oxygen)  Patient Saturations on Room Air at Rest = 93%  Patient Saturations on Room Air while Ambulating = 89%  Patient Saturations on   Liters of oxygen while Ambulating = 98%  Please briefly explain why patient needs home oxygen: Patient desats without oxygen while ambulating

## 2021-10-14 NOTE — Evaluation (Signed)
Speech Language Pathology Evaluation Patient Details Name: Richard [name] MRN: [id number] DOB: Aug 05, 1971 Today's Date: 10/14/2021 Time: 8921-1941 SLP Time Calculation (min) (ACUTE ONLY): 14 min  Problem List:  Patient Active Problem List   Diagnosis Date Noted   Right sided weakness 10/13/2021   Right lower lobe lung mass 04/09/2020   Right upper lobe pulmonary nodule 04/09/2020   Alcohol abuse with intoxication ([place]) 03/04/2020   Hypoxia 03/04/2020   Swollen testicle    Atelectasis    Syncope 08/26/2015   Acute encephalopathy 08/26/2015   Seizure ([place])    Acute pulmonary edema ([place])    Lower leg DVT (deep venous thromboembolism), acute ([place])    Protein-calorie malnutrition, severe ([place]) 02/19/2015   Bacteremia due to Staphylococcus 02/17/2015   Alcohol withdrawal delirium ([place])    Acute respiratory failure with hypoxia ([place]) 02/13/2015   Acute respiratory failure with hypoxemia ([facility])    Altered mental status    Metabolic acidosis    Essential hypertension    Encephalopathy 02/11/2015   Alcohol intoxication ([place]) 02/11/2015   Lactic acidosis 02/11/2015   Bleeding from the nose 03/19/2014   TIA (transient ischemic attack) 12/24/2013   Weakness 12/23/2013   Left-sided weakness 12/23/2013   Malignant hypertension 12/23/2013   Seizure disorder ([place]) 12/23/2013   Vomiting 06/18/2013   Chest pain 06/18/2013   Hypertensive urgency 10/30/2012   Migraine variant 10/30/2012   TOBACCO ABUSE 01/11/2008   HYPERTENSION, BENIGN 11/24/2007   CHRONIC FRONTAL SINUSITIS 11/24/2007   Convulsions ([place]) 11/24/2007   Headache(784.0) 11/24/2007   Past Medical History:  Past Medical History:  Diagnosis Date   Asthma    Brain bleed ([place])    Hypertension    Seizures ([place])    last episode 03/2013   Past Surgical History:  Past Surgical History:  Procedure Laterality Date   LEG SURGERY     HPI:  Richard [name] is a 50 y.o. male presented to ED after stating he was walking back to his  house from the porch when he suddenly lost consciousness and fell and following waking up he was weak on the right side. +cocaine. MRI - no acute event; chronic encephalomalacia and gliosis involving the anterior left  frontal lobe, stable. Underlying atrophy with moderate chronic microvascular ischemic disease. Dx possible TIA, hypertensive emergency. PHMx: hypertension, prior TIA, emphysema, tobacco and alcohol use,CT angio does show the known history of lung mass which is being followed by oncologist   Assessment / Plan / Recommendation Clinical Impression  Pt presents with what are likely baseline cognitive impairments based on imaging results with notable chronic atrophy but no acute event. Pt was assessed with the SLUMs, achieving a score of 17/30 with deficits notable in the areas of memory (subscores for two areas of recall 3/5 and 4/8); visuospatial construction and abstract thinking 0/4, mental calculation 1/3, selective attention 1/2. Pt demonstrated adequate problem -solving for phone management, ordering meals, resolving miscommunication re: meal.  Expressive and receptive language are WNL; speech is clear and fluent. Pt will have support from family upon D/C and will have initial 24 hour supervision.  No formalized SLP intervention is warranted after D/C. Our service will sign off.    SLP Assessment  SLP Recommendation/Assessment: Patient does not need any further Speech [place] [facility] Services SLP Visit Diagnosis: Cognitive communication deficit (R41.841)    Recommendations for follow up therapy are one component of a multi-disciplinary discharge planning process, led by the attending physician.  Recommendations may be updated based on patient status, additional  functional criteria and insurance authorization.    Follow Up Recommendations  None               SLP Evaluation Cognition  Overall Cognitive Status: Difficult to assess Arousal/Alertness: Awake/alert Orientation  Level: Oriented X4 Attention: Selective Selective Attention: Impaired Selective Attention Impairment: Verbal basic Memory: Impaired Memory Impairment: Storage deficit;Retrieval deficit Awareness: Impaired Awareness Impairment: Emergent impairment       Comprehension  Auditory Comprehension Overall Auditory Comprehension: Appears within functional limits for tasks assessed Visual Recognition/Discrimination Discrimination: Within Function Limits Reading Comprehension Reading Status: Within funtional limits    Expression Expression Primary Mode of Expression: Verbal Verbal Expression Overall Verbal Expression: Appears within functional limits for tasks assessed Written Expression Dominant Hand: Right Written Expression: Within Functional Limits   Oral / Motor  Oral Motor/Sensory Function Overall Oral Motor/Sensory Function: Within functional limits Motor Speech Overall Motor Speech: Appears within functional limits for tasks assessed   GO                   Richard [name] L. Richard [name], Richard [name] CCC/SLP Acute Rehabilitation Services Office number [phone] Pager [phone]  Juan Quam Laurice 10/14/2021, 10:15 AM

## 2021-10-14 NOTE — Plan of Care (Signed)
Patient on phenytoin 50 mg twice daily.  Level undetectable.  Also noncompliant with phenytoin. Recommend switching to Keppra. Loaded with 1 g IV x1 now followed by 500 twice daily p.o. as an outpatient Follow-up with outpatient neurology in 4 to 6 weeks. Maintain seizure precautions Discussed with Dr. Louanne Belton  -- Amie [place], MD Neurologist [facility] Pager: [phone]

## 2021-10-14 NOTE — Discharge Summary (Addendum)
Physician Discharge Summary  Richard [name] QJJ:[id number] DOB: 05/01/1971 DOA: 10/12/2021  PCP: Patient, No Pcp Per (Inactive)  Admit date: 10/12/2021 Discharge date: 10/14/2021  Admitted From: Home  Discharge disposition: Home with home health  Recommendations for Outpatient Follow-Up:   Follow up with your primary care provider in one week.  Check CBC, BMP, magnesium in the next visit Follow-up with [facility] as outpatient in 4 to 6 weeks  Discharge Diagnosis:   Principal Problem:   TIA (transient ischemic attack) Active Problems:   Hypertensive urgency   Seizure disorder [facility])   Essential hypertension   Right sided weakness Todd's palsy  Discharge Condition: Improved.  Diet recommendation: Low sodium, heart healthy.    Wound care: None.  Code status: Full code.  History of Present Illness:   Richard [name] is a 50 y.o. male with history of hypertension, prior TIA, emphysema, lung mass, tobacco and alcohol use was brought into the hospital after he suddenly lost his consciousness and when he woke up he was weak on the right side.  In the ED a CT scan of the head was performed followed by CT perfusion study, CTA.  Neurology was consulted.  MRI of the brain was negative for acute stroke.    CT angiogram does show the known history of lung mass which is being followed by oncologist.  Troponins were negative including EKG.  Drug screen was positive for cocaine.  COVID test was negative.  Patient was continued to have right-sided weakness of patient was admitted hospital for further evaluation and treatment.    Hospital Course:   Following conditions were addressed during hospitalization as listed below,  Right-sided weakness, history of seizure disorder. Possibilities included possible TIA versus Todd's paralysis.  Dilantin level was subtherapeutic likely to Todd's paralysis.  EEG did not show seizure-like activity.  MRI of the brain did not show any  acute process.  Continue aspirin daily on discharge as per neurology recommendation..  2D echocardiogram showed LV ejection fraction of 55 to 60%.  Hemoglobin A1c of 6.1.  Lipid panel reviewed with LDL cholesterol of 68.  Physical therapy has seen the patient and recommended home physical therapy on discharge.  This will be arranged on discharge.   Hypertensive urgency  No evidence of stroke on the MRI. Initially permissive hypertension was undertaken.  Will resume clonidine, losartan on discharge.   COPD/emphysema/lung mass.   Followed by pulmonary as outpatient.  Recently placed on Spiriva.  Currently on room air.  Patient has qualified for home oxygen on discharge.   Seizure disorder  on Dilantin at home but levels very low likely noncompliance.Marland Kitchen  Spoke with neurology about it.  Neurology recommended Keppra loading followed by Keppra twice daily on discharge.  Keppra will be prescribed on discharge and Dilantin will be discontinued.   Polysubstance abuse.  History of tobacco alcohol and cocaine use.  TOC consulted.  Continue thiamine folic acid on discharge.   Patient was counseled against it   Obesity.  Present on admission.  Would benefit from weight loss as outpatient.  Disposition.  At this time, patient is stable for disposition home with outpatient PCP and neurology follow-up.  Patient was very adamant about leaving today and was insistent on leaving as soon as possible so patient discharge instructions was made to ensure that he had continued of care.  Medical Consultants:   Neurology  Procedures:    EEG Subjective:   Today, patient was seen and examined at bedside.  Patient  complains of right-sided weakness.  Denies any headache, nausea, vomiting, chest pain or shortness of breath.  Discharge Exam:   Vitals:   10/14/21 1103 10/14/21 1428  BP: (!) 156/122 (!) 133/107  Pulse: 77   Resp: 20   Temp: 98.3 F (36.8 C)   SpO2: 92%    Vitals:   10/14/21 0400 10/14/21 0756  10/14/21 1103 10/14/21 1428  BP: (!) 164/109 (!) 170/113 (!) 156/122 (!) 133/107  Pulse: 88 92 77   Resp: 20 20 20    Temp: 98 F (36.7 C) 98.8 F (37.1 C) 98.3 F (36.8 C)   TempSrc: Oral Oral Oral   SpO2: 100% 96% 92%   Weight:      Height:       GENERAL: Patient is alert awake and oriented. Not in obvious distress.  Obese HENT: No scleral pallor or icterus. Pupils equally reactive to light. Oral mucosa is moist NECK: is supple, no gross swelling noted. CHEST: Clear to auscultation. No crackles or wheezes.  Diminished breath sounds bilaterally. CVS: S1 and S2 heard, no murmur. Regular rate and rhythm.  ABDOMEN: Soft, non-tender, bowel sounds are present. EXTREMITIES: No edema.  Mild right-sided weakness. CNS: Cranial nerves are intact.  Mild right-sided weakness. SKIN: warm and dry without rashes.  The results of significant diagnostics from this hospitalization (including imaging, microbiology, ancillary and laboratory) are listed below for reference.     Diagnostic Studies:   DG Pelvis 1-2 Views  Result Date: 10/13/2021 CLINICAL DATA:  Fall EXAM: PELVIS - 1-2 VIEW COMPARISON:  None similar FINDINGS: No hip dislocation or fracture. Intact pelvic ring. Sclerosis over the right ilium is a large bone island based on PET CT in 2021 IMPRESSION: No acute finding. Electronically Signed   By: Jorje Guild M.D.   On: 10/13/2021 07:31   MR BRAIN WO CONTRAST  Result Date: 10/13/2021 CLINICAL DATA:  Initial evaluation for acute TIA, right-sided weakness. EXAM: MRI HEAD WITHOUT CONTRAST TECHNIQUE: Multiplanar, multiecho pulse sequences of the brain and surrounding structures were obtained without intravenous contrast. COMPARISON:  Prior CTs from 10/12/2021. FINDINGS: Brain: Diffuse prominence of the CSF containing spaces compatible generalized cerebral atrophy. Associated diffuse prominence of the perivascular spaces. Patchy and confluent T2/FLAIR hyperintensity within the periventricular  deep white matter both cerebral hemispheres as well as the pons, most consistent with chronic small vessel ischemic disease, moderate in nature. Chronic encephalomalacia and gliosis involving the anterior left frontal lobe again noted, which could be related to prior infarct and/or trauma. No abnormal foci of restricted diffusion to suggest acute or subacute ischemia. Gray-white matter differentiation maintained. No encephalomalacia to suggest chronic cortical infarction elsewhere within the brain. No acute intracranial hemorrhage. Few scattered punctate chronic micro hemorrhages noted, likely small vessel related. No mass lesion, midline shift or mass effect. No hydrocephalus or extra-axial fluid collection. Pituitary gland suprasellar region within normal limits. Midline structures intact. Vascular: Major intracranial vascular flow voids are maintained. Skull and upper cervical spine: Craniocervical junction within normal limits. Bone marrow signal intensity normal. No scalp soft tissue abnormality. Sinuses/Orbits: Globes and orbital soft tissues within normal limits. Chronic left maxillary sinusitis noted. Paranasal sinuses are otherwise clear. No mastoid effusion. Other: None. IMPRESSION: 1. No acute intracranial infarct or other abnormality. 2. Chronic encephalomalacia and gliosis involving the anterior left frontal lobe, stable. 3. Underlying atrophy with moderate chronic microvascular ischemic disease. 4. Chronic left maxillary sinusitis. Electronically Signed   By: Jeannine Boga M.D.   On: 10/13/2021 03:00   CT  CEREBRAL PERFUSION W CONTRAST  Result Date: 10/12/2021 CLINICAL DATA:  Initial evaluation for neuro deficit, stroke suspected. EXAM: CT PERFUSION BRAIN TECHNIQUE: Multiphase CT imaging of the brain was performed following IV bolus contrast injection. Subsequent parametric perfusion maps were calculated using RAPID software. CONTRAST:  50mL OMNIPAQUE IOHEXOL 350 MG/ML SOLN COMPARISON:   Prior CT and CTA from earlier the same day. FINDINGS: CT Brain Perfusion Findings: CBF (<30%) Volume: 52mL Perfusion (Tmax>6.0s) volume: 74mL Mismatch Volume: -70mL ASPECTS on noncontrast CT Head: 10 at 6:07 p.m. today. Infarct Core: No acute core infarct. Infarction Location:Matched perfusion defect at the anterior left frontal convexity, corresponding with area of chronic encephalomalacia a seen on prior CT. No other acute core infarct or other perfusion deficit. IMPRESSION: 1. Negative CT perfusion with no evidence for acute ischemia or other perfusion deficit. 2. 3 mL matched perfusion defect at the anterior left frontal convexity, corresponding with area of chronic encephalomalacia seen on prior CT. Electronically Signed   By: Jeannine Boga M.D.   On: 10/12/2021 19:09   EEG adult  Result Date: 10/13/2021 Lora Havens, MD     10/13/2021 12:57 PM Patient Name: Richard [name] MRN: [id number] Epilepsy Attending: Lora Havens Referring Physician/Provider: Dr Gean Birchwood Date: 10/13/2021 Duration: 21.49 mins Patient history: 50 year old male with history of seizures will presented with right-sided weakness.  EEG to evaluate for seizure. Level of alertness: Awake AEDs during EEG study: Dilantin Technical aspects: This EEG study was done with scalp electrodes positioned according to the 10-20 International system of electrode placement. Electrical activity was acquired at a sampling rate of 500Hz  and reviewed with a high frequency filter of 70Hz  and a low frequency filter of 1Hz . EEG data were recorded continuously and digitally stored. Description: The posterior dominant rhythm consists of 8-9 Hz activity of moderate voltage (25-35 uV) seen predominantly in posterior head regions, symmetric and reactive to eye opening and eye closing. Hyperventilation and photic stimulation were not performed.   IMPRESSION: This study is within normal limits. No seizures or epileptiform discharges were seen  throughout the recording. Lora Havens   ECHOCARDIOGRAM COMPLETE  Result Date: 10/13/2021    ECHOCARDIOGRAM REPORT   Patient Name:   Richard [name] Date of Exam: 10/13/2021 Medical Rec #:  [id number]    Height:       69.0 in Accession #:    [id number]   Weight:       224.0 lb Date of Birth:  [birth year]    BSA:          2.168 m Patient Age:    36 years     BP:           155/106 mmHg Patient Gender: M            HR:           76 bpm. Exam Location:  Inpatient Procedure: 2D Echo, Color Doppler and Cardiac Doppler Indications:    TIA  History:        Patient has prior history of Echocardiogram examinations. Risk                 Factors:Hypertension.  Sonographer:    Jyl Heinz Referring Phys: [place]  1. Left ventricular ejection fraction, by estimation, is 55 to 60%. The left ventricle has normal function. The left ventricle has no regional wall motion abnormalities. There is moderate left ventricular hypertrophy of the basal-septal segment. Left ventricular diastolic parameters are consistent with Grade  I diastolic dysfunction (impaired relaxation).  2. Right ventricular systolic function is normal. The right ventricular size is normal. Tricuspid regurgitation signal is inadequate for assessing PA pressure.  3. The mitral valve is normal in structure. No evidence of mitral valve regurgitation. No evidence of mitral stenosis.  4. The aortic valve is normal in structure. Aortic valve regurgitation is mild to moderate. No aortic stenosis is present. Aortic regurgitation PHT measures 402 msec.  5. Aortic dilatation noted. There is mild dilatation of the aortic root, measuring 39 mm.  6. The inferior vena cava is dilated in size with >50% respiratory variability, suggesting right atrial pressure of 8 mmHg. Conclusion(s)/Recommendation(s): No intracardiac source of embolism detected on this transthoracic study. A transesophageal echocardiogram is recommended to exclude cardiac source of  embolism if clinically indicated. FINDINGS  Left Ventricle: Left ventricular ejection fraction, by estimation, is 55 to 60%. The left ventricle has normal function. The left ventricle has no regional wall motion abnormalities. The left ventricular internal cavity size was normal in size. There is  moderate left ventricular hypertrophy of the basal-septal segment. Left ventricular diastolic parameters are consistent with Grade I diastolic dysfunction (impaired relaxation). Normal left ventricular filling pressure. Right Ventricle: The right ventricular size is normal. No increase in right ventricular wall thickness. Right ventricular systolic function is normal. Tricuspid regurgitation signal is inadequate for assessing PA pressure. Left Atrium: Left atrial size was normal in size. Right Atrium: Right atrial size was normal in size. Pericardium: There is no evidence of pericardial effusion. Mitral Valve: The mitral valve is normal in structure. No evidence of mitral valve regurgitation. No evidence of mitral valve stenosis. Tricuspid Valve: The tricuspid valve is normal in structure. Tricuspid valve regurgitation is trivial. No evidence of tricuspid stenosis. Aortic Valve: The aortic valve is normal in structure. Aortic valve regurgitation is mild to moderate. Aortic regurgitation PHT measures 402 msec. No aortic stenosis is present. Aortic valve peak gradient measures 7.1 mmHg. Pulmonic Valve: The pulmonic valve was normal in structure. Pulmonic valve regurgitation is trivial. No evidence of pulmonic stenosis. Aorta: Aortic dilatation noted. There is mild dilatation of the aortic root, measuring 39 mm. Venous: The inferior vena cava is dilated in size with greater than 50% respiratory variability, suggesting right atrial pressure of 8 mmHg. IAS/Shunts: The interatrial septum appears to be lipomatous. No atrial level shunt detected by color flow Doppler.  LEFT VENTRICLE PLAX 2D LVIDd:         4.70 cm      Diastology  LVIDs:         3.70 cm      LV e' medial:    6.09 cm/s LV PW:         1.30 cm      LV E/e' medial:  7.7 LV IVS:        1.60 cm      LV e' lateral:   9.14 cm/s LVOT diam:     2.50 cm      LV E/e' lateral: 5.2 LV SV:         101 LV SV Index:   46 LVOT Area:     4.91 cm  LV Volumes (MOD) LV vol d, MOD A2C: 105.0 ml LV vol d, MOD A4C: 131.0 ml LV vol s, MOD A2C: 44.6 ml LV vol s, MOD A4C: 54.0 ml LV SV MOD A2C:     60.4 ml LV SV MOD A4C:     131.0 ml LV SV MOD BP:  68.9 ml RIGHT VENTRICLE             IVC RV Basal diam:  3.40 cm     IVC diam: 2.50 cm RV Mid diam:    2.90 cm RV S prime:     13.60 cm/s TAPSE (M-mode): 1.9 cm LEFT ATRIUM             Index        RIGHT ATRIUM           Index LA diam:        3.70 cm 1.71 cm/m   RA Area:     12.40 cm LA Vol (A2C):   37.7 ml 17.39 ml/m  RA Volume:   25.80 ml  11.90 ml/m LA Vol (A4C):   30.0 ml 13.84 ml/m LA Biplane Vol: 33.7 ml 15.54 ml/m  AORTIC VALVE AV Area (Vmax): 3.93 cm AV Vmax:        133.50 cm/s AV Peak Grad:   7.1 mmHg LVOT Vmax:      107.00 cm/s LVOT Vmean:     80.250 cm/s LVOT VTI:       0.205 m AI PHT:         402 msec  AORTA Ao Root diam: 3.90 cm MITRAL VALVE MV Area (PHT): 3.43 cm    SHUNTS MV Decel Time: 221 msec    Systemic VTI:  0.20 m MV E velocity: 47.10 cm/s  Systemic Diam: 2.50 cm MV A velocity: 53.40 cm/s MV E/A ratio:  0.88 Fransico Him MD Electronically signed by Fransico Him MD Signature Date/Time: 10/13/2021/9:51:14 AM    Final    CT Angio Chest/Abd/Pel for Dissection W and/or W/WO  Result Date: 10/12/2021 CLINICAL DATA:  50 year old male with chest, back and abdominal pain. EXAM: CT ANGIOGRAPHY CHEST, ABDOMEN AND PELVIS TECHNIQUE: Non-contrast CT of the chest was initially obtained. Multidetector CT imaging through the chest, abdomen and pelvis was performed using the standard protocol during bolus administration of intravenous contrast. Multiplanar reconstructed images and MIPs were obtained and reviewed to evaluate the vascular  anatomy. CONTRAST:  58mL OMNIPAQUE IOHEXOL 350 MG/ML SOLN COMPARISON:  04/15/2020 PET CT and prior studies FINDINGS: CTA CHEST FINDINGS Cardiovascular: Preferential opacification of the thoracic aorta. No evidence of thoracic aortic aneurysm or dissection. Normal heart size. No pericardial effusion. Mediastinum/Nodes: Enlarged RIGHT hilar, RIGHT paratracheal and subcarinal lymph nodes are noted with index nodes including a 1.6 cm RIGHT paratracheal node (series 6: Image 51), a 1.6 cm subcarinal node (6:64). The trachea, visualized thyroid and esophagus are unremarkable. Lungs/Pleura: A 7.5 cm irregular mass is identified within the central/perihilar RIGHT lung along both sides of the major fissure and involving both the RIGHT UPPER and RIGHT LOWER lobes compatible with malignancy. This mass extends into the mediastinum. A 2.5 cm satellite mass within the RIGHT UPPER lobe is noted (8:52). The LEFT lung is clear. No pleural effusion or pneumothorax. Musculoskeletal: No acute or suspicious bony abnormalities are noted. Review of the MIP images confirms the above findings. CTA ABDOMEN AND PELVIS FINDINGS VASCULAR Aorta: Aortic atherosclerotic calcifications noted. Normal caliber aorta without aneurysm, dissection, vasculitis or significant stenosis. Celiac: Patent without evidence of aneurysm, dissection, vasculitis or significant stenosis. SMA: Patent without evidence of aneurysm, dissection, vasculitis or significant stenosis. Renals: Both renal arteries are patent without evidence of aneurysm, dissection, vasculitis, fibromuscular dysplasia or significant stenosis. IMA: Patent without evidence of aneurysm, dissection, vasculitis or significant stenosis. Inflow: Patent without evidence of aneurysm, dissection, vasculitis or significant stenosis. Veins:  No obvious venous abnormality within the limitations of this arterial phase study. Review of the MIP images confirms the above findings. NON-VASCULAR Hepatobiliary: The  liver and gallbladder are unremarkable. No biliary dilatation. Pancreas: Unremarkable Spleen: Unremarkable Adrenals/Urinary Tract: The kidneys and adrenal glands are unremarkable except for stable bilateral adrenal adenomas and a RIGHT renal cyst. Stomach/Bowel: Stomach is within normal limits. Appendix appears normal. No evidence of bowel wall thickening, distention, or inflammatory changes. Lymphatic: No abnormal lymph nodes are noted within the abdomen or pelvis. Reproductive: Prostate is unremarkable. Other: No ascites, focal collection or pneumoperitoneum. Bilateral hydroceles are present. Musculoskeletal: No acute or suspicious bony abnormalities are identified. Degenerative changes at L5-S1 again noted as well as a RIGHT iliac bone island. Review of the MIP images confirms the above findings. IMPRESSION: 1. 7.5 cm central/perihilar RIGHT lung malignancy involving both the RIGHT UPPER and RIGHT LOWER lobes, with some extension into the mediastinum. 2.5 cm satellite mass within the RIGHT UPPER lobe. Mildly enlarged RIGHT hilar, RIGHT paratracheal and subcarinal lymph nodes likely representing lymphatic spread. 2. No evidence of aortic aneurysm or dissection. 3. No acute abnormality within the abdomen or pelvis. 4. Stable bilateral adrenal adenomas. 5. Bilateral hydroceles. Electronically Signed   By: Margarette Canada M.D.   On: 10/12/2021 18:48   CT HEAD CODE STROKE WO CONTRAST  Result Date: 10/12/2021 CLINICAL DATA:  Code stroke. Neuro deficit, acute, stroke suspected. Right upper and lower extremity numbness and weakness. Right facial numbness. EXAM: CT HEAD WITHOUT CONTRAST TECHNIQUE: Contiguous axial images were obtained from the base of the skull through the vertex without intravenous contrast. COMPARISON:  CT head without contrast 07/11/2020. FINDINGS: Brain: Chronic encephalomalacia of the anterior left frontal lobe is again noted. No acute cortical infarct is present. Basal ganglia are intact. Insular  ribbon is within normal limits bilaterally. Diffuse white matter changes are stable. The ventricles are of normal size. No significant extraaxial fluid collection is present. Insert normal brainstem Vascular: No unexpected calcifications are hyperdense vessel is present. Skull: Calvarium is intact. No focal lytic or blastic lesions are present. Sinuses/Orbits: The paranasal sinuses and mastoid air cells are clear. The globes and orbits are within normal limits. ASPECTS [facility] Stroke Program Early CT Score) - Ganglionic level infarction (caudate, lentiform nuclei, internal capsule, insula, M1-M3 cortex): 7/7 - Supraganglionic infarction (M4-M6 cortex): 3/3 Total score (0-10 with 10 being normal): 10/10 IMPRESSION: 1. No acute intracranial abnormality or significant interval change. Aspects 10/10 2. Stable chronic encephalomalacia of the anterior left frontal lobe. 3. Stable diffuse white matter disease. These results were called by telephone at the time of interpretation on 10/12/2021 at 6:16 pm to provider [facility] , who verbally acknowledged these results. Electronically Signed   By: San Morelle M.D.   On: 10/12/2021 18:16   CT ANGIO HEAD CODE STROKE  Result Date: 10/12/2021 CLINICAL DATA:  Stroke.  New right-sided weakness. EXAM: CT ANGIOGRAPHY HEAD AND NECK TECHNIQUE: Multidetector CT imaging of the head and neck was performed using the standard protocol during bolus administration of intravenous contrast. Multiplanar CT image reconstructions and MIPs were obtained to evaluate the vascular anatomy. Carotid stenosis measurements (when applicable) are obtained utilizing NASCET criteria, using the distal internal carotid diameter as the denominator. CONTRAST:  176mL OMNIPAQUE IOHEXOL 350 MG/ML SOLN COMPARISON:  CT head without contrast 09/12/2021. FINDINGS: CTA NECK FINDINGS Aortic arch: The left common carotid artery and innominate artery share a common origin. Aortic arch is otherwise  unremarkable. No stenosis or aneurysm is present.  Right carotid system: The right common carotid artery is within normal limits. Imaging the bifurcation somewhat degraded by motion. No significant stenosis is present. The cervical right ICA is within normal limits. Left carotid system: The left common carotid artery is within normal limits. Images the bifurcation is somewhat degraded by patient motion. Cervical left ICA is within normal limits. Vertebral arteries: The left vertebral artery is the dominant vessel. Both vertebral arteries originate from the subclavian arteries. No significant stenosis is present in either vertebral artery in the neck. Skeleton: Vertebral body heights are maintained. Mild endplate changes are noted. No focal osseous abnormalities are present. Extensive dental disease is noted. Other neck: Soft tissues the neck are otherwise unremarkable. Salivary glands are within normal limits. Thyroid is normal. No significant adenopathy is present. No focal mucosal or submucosal lesions are present. Upper chest: Centrilobular emphysematous changes are present. No nodule or mass lesion is present. No significant pleural disease is present. Thoracic inlet is within normal limits. Review of the MIP images confirms the above findings CTA HEAD FINDINGS Anterior circulation: The internal carotid arteries are within normal limits from the high cervical segments through the ICA termini. The A1 and M1 segments are normal. MCA bifurcations are within normal limits. No significant proximal stenosis is present. Diffuse segmental narrowing is present at the distal ACA and MCA branch vessels bilaterally. Posterior circulation: The left vertebral artery is the dominant vessel. PICA origins are visualized and within normal limits. Vertebrobasilar junction is normal. The basilar artery is normal. Both posterior cerebral arteries originate from the basilar tip. No significant proximal stenosis is present. There is  moderate narrowing of distal PCA branch vessels. Venous sinuses: The dural sinuses are patent. The straight sinus deep cerebral veins are intact. Cortical veins are within normal limits. No significant vascular malformation is evident. Anatomic variants: None Review of the MIP images confirms the above findings IMPRESSION: 1. No emergent large vessel occlusion. 2. Diffuse segmental narrowing of the distal ACA and MCA branch vessels bilaterally without a significant proximal stenosis, aneurysm, or branch vessel occlusion. Findings are compatible with intracranial atherosclerotic change. 3. Extensive dental disease. Electronically Signed   By: San Morelle M.D.   On: 10/12/2021 18:29   CT ANGIO NECK CODE STROKE  Result Date: 10/12/2021 CLINICAL DATA:  Stroke.  New right-sided weakness. EXAM: CT ANGIOGRAPHY HEAD AND NECK TECHNIQUE: Multidetector CT imaging of the head and neck was performed using the standard protocol during bolus administration of intravenous contrast. Multiplanar CT image reconstructions and MIPs were obtained to evaluate the vascular anatomy. Carotid stenosis measurements (when applicable) are obtained utilizing NASCET criteria, using the distal internal carotid diameter as the denominator. CONTRAST:  149mL OMNIPAQUE IOHEXOL 350 MG/ML SOLN COMPARISON:  CT head without contrast 09/12/2021. FINDINGS: CTA NECK FINDINGS Aortic arch: The left common carotid artery and innominate artery share a common origin. Aortic arch is otherwise unremarkable. No stenosis or aneurysm is present. Right carotid system: The right common carotid artery is within normal limits. Imaging the bifurcation somewhat degraded by motion. No significant stenosis is present. The cervical right ICA is within normal limits. Left carotid system: The left common carotid artery is within normal limits. Images the bifurcation is somewhat degraded by patient motion. Cervical left ICA is within normal limits. Vertebral  arteries: The left vertebral artery is the dominant vessel. Both vertebral arteries originate from the subclavian arteries. No significant stenosis is present in either vertebral artery in the neck. Skeleton: Vertebral body heights are maintained. Mild endplate  changes are noted. No focal osseous abnormalities are present. Extensive dental disease is noted. Other neck: Soft tissues the neck are otherwise unremarkable. Salivary glands are within normal limits. Thyroid is normal. No significant adenopathy is present. No focal mucosal or submucosal lesions are present. Upper chest: Centrilobular emphysematous changes are present. No nodule or mass lesion is present. No significant pleural disease is present. Thoracic inlet is within normal limits. Review of the MIP images confirms the above findings CTA HEAD FINDINGS Anterior circulation: The internal carotid arteries are within normal limits from the high cervical segments through the ICA termini. The A1 and M1 segments are normal. MCA bifurcations are within normal limits. No significant proximal stenosis is present. Diffuse segmental narrowing is present at the distal ACA and MCA branch vessels bilaterally. Posterior circulation: The left vertebral artery is the dominant vessel. PICA origins are visualized and within normal limits. Vertebrobasilar junction is normal. The basilar artery is normal. Both posterior cerebral arteries originate from the basilar tip. No significant proximal stenosis is present. There is moderate narrowing of distal PCA branch vessels. Venous sinuses: The dural sinuses are patent. The straight sinus deep cerebral veins are intact. Cortical veins are within normal limits. No significant vascular malformation is evident. Anatomic variants: None Review of the MIP images confirms the above findings IMPRESSION: 1. No emergent large vessel occlusion. 2. Diffuse segmental narrowing of the distal ACA and MCA branch vessels bilaterally without a  significant proximal stenosis, aneurysm, or branch vessel occlusion. Findings are compatible with intracranial atherosclerotic change. 3. Extensive dental disease. Electronically Signed   By: San Morelle M.D.   On: 10/12/2021 18:29     Labs:   Basic Metabolic Panel: Recent Labs  Lab 10/12/21 1800 10/12/21 1807 10/13/21 0643  NA 139 142 134*  K 4.3 4.1 4.5  CL 108 107 102  CO2 22  --  23  GLUCOSE 104* 100* 110*  BUN 6 6 8   CREATININE 0.88 0.80 0.96  CALCIUM 9.1  --  9.0   GFR Estimated Creatinine Clearance: 107 mL/min (by C-G formula based on SCr of 0.96 mg/dL). Liver Function Tests: Recent Labs  Lab 10/12/21 1800 10/13/21 0643  AST 16 23  ALT 12 14  ALKPHOS 79 81  BILITOT 0.5 1.1  PROT 7.2 6.9  ALBUMIN 3.4* 3.2*   No results for input(s): LIPASE, AMYLASE in the last 168 hours. No results for input(s): AMMONIA in the last 168 hours. Coagulation profile Recent Labs  Lab 10/12/21 1800  INR 1.0    CBC: Recent Labs  Lab 10/12/21 1800 10/12/21 1807 10/13/21 0643  WBC 9.2  --  10.5  NEUTROABS 6.3  --   --   HGB 16.7 18.0* 16.2  HCT 50.9 53.0* 50.4  MCV 92.7  --  94.4  PLT 319  --  331   Cardiac Enzymes: No results for input(s): CKTOTAL, CKMB, CKMBINDEX, TROPONINI in the last 168 hours. BNP: Invalid input(s): POCBNP CBG: Recent Labs  Lab 10/12/21 1756  GLUCAP 98   D-Dimer No results for input(s): DDIMER in the last 72 hours. Hgb A1c Recent Labs    10/13/21 0644  HGBA1C 6.1*   Lipid Profile Recent Labs    10/13/21 0645  CHOL 124  HDL 44  LDLCALC 68  TRIG 58  CHOLHDL 2.8   Thyroid function studies No results for input(s): TSH, T4TOTAL, T3FREE, THYROIDAB in the last 72 hours.  Invalid input(s): FREET3 Anemia work up No results for input(s): VITAMINB12, FOLATE, FERRITIN, TIBC,  IRON, RETICCTPCT in the last 72 hours. Microbiology Recent Results (from the past 240 hour(s))  Resp Panel by RT-PCR (Flu A&B, Covid) Nasopharyngeal Swab      Status: None   Collection Time: 10/12/21  6:02 PM   Specimen: Nasopharyngeal Swab; Nasopharyngeal(NP) swabs in vial transport medium  Result Value Ref Range Status   SARS Coronavirus 2 by RT PCR NEGATIVE NEGATIVE Final    Comment: (NOTE) SARS-CoV-2 target nucleic acids are NOT DETECTED.  The SARS-CoV-2 RNA is generally detectable in upper respiratory specimens during the acute phase of infection. The lowest concentration of SARS-CoV-2 viral copies this assay can detect is 138 copies/mL. A negative result does not preclude SARS-Cov-2 infection and should not be used as the sole basis for treatment or other patient management decisions. A negative result may occur with  improper specimen collection/handling, submission of specimen other than nasopharyngeal swab, presence of viral mutation(s) within the areas targeted by this assay, and inadequate number of viral copies(<138 copies/mL). A negative result must be combined with clinical observations, patient history, and epidemiological information. The expected result is Negative.  Fact Sheet for Patients:  EntrepreneurPulse.com.au  Fact Sheet for Healthcare Providers:  IncredibleEmployment.be  This test is no t yet approved or cleared by the [place] FDA and  has been authorized for detection and/or diagnosis of SARS-CoV-2 by FDA under an Emergency Use Authorization (EUA). This EUA will remain  in effect (meaning this test can be used) for the duration of the COVID-19 declaration under Section 564(b)(1) of the Act, 21 U.S.C.section 360bbb-3(b)(1), unless the authorization is terminated  or revoked sooner.       Influenza A by PCR NEGATIVE NEGATIVE Final   Influenza B by PCR NEGATIVE NEGATIVE Final    Comment: (NOTE) The Xpert Xpress SARS-CoV-2/FLU/RSV plus assay is intended as an aid in the diagnosis of influenza from Nasopharyngeal swab specimens and should not be used as a sole  basis for treatment. Nasal washings and aspirates are unacceptable for Xpert Xpress SARS-CoV-2/FLU/RSV testing.  Fact Sheet for Patients: EntrepreneurPulse.com.au  Fact Sheet for Healthcare Providers: IncredibleEmployment.be  This test is not yet approved or cleared by the [place] FDA and has been authorized for detection and/or diagnosis of SARS-CoV-2 by FDA under an Emergency Use Authorization (EUA). This EUA will remain in effect (meaning this test can be used) for the duration of the COVID-19 declaration under Section 564(b)(1) of the Act, 21 U.S.C. section 360bbb-3(b)(1), unless the authorization is terminated or revoked.  Performed at [facility], [place] [address], [place], [place] [address]      Discharge Instructions:   Discharge Instructions     Ambulatory referral to Neurology   Complete by: As directed    An appointment is requested in approximately: 4 weeks   Diet - low sodium heart healthy   Complete by: As directed    Discharge instructions   Complete by: As directed    Follow up with your primary care provider in one week. Follow up with [facility] in 4 to 6 weeks.  Continue to take Keppra  medication for your seizure without interruption. Dilantin has been discontinued. Do not smoke or drink alcohol.   Increase activity slowly   Complete by: As directed       Allergies as of 10/14/2021       Reactions   Dilaudid [hydromorphone Hcl] Nausea Only, Other (See Comments)   Bradycardia and hyperthermia, too   Tape Itching, Dermatitis  Medication List     STOP taking these medications    ibuprofen 200 MG tablet Commonly known as: ADVIL   naproxen 500 MG tablet Commonly known as: NAPROSYN   phenytoin 50 MG tablet Commonly known as: DILANTIN       TAKE these medications    aspirin 325 MG EC tablet Take 1 tablet (325 mg total) by mouth daily. Swallow whole. What changed:   medication strength how much to take when to take this reasons to take this   Aspirin-Acetaminophen-Caffeine 500-325-65 MG Pack Take 1-3 packets by mouth daily as needed (for pain).   carbamide peroxide 6.5 % OTIC solution Commonly known as: DEBROX Place 2 drops into both ears at bedtime as needed (ear pain).   cloNIDine 0.3 MG tablet Commonly known as: CATAPRES Take 1 tablet (0.3 mg total) by mouth 2 (two) times daily.   DRY EYES OP Place 1-2 drops into both eyes 2 (two) times daily as needed (dry eyes).   folic acid 1 MG tablet Commonly known as: FOLVITE Take 1 tablet (1 mg total) by mouth daily. Start taking on: October 15, 2021   levETIRAcetam 500 MG tablet Commonly known as: KEPPRA Take 1 tablet (500 mg total) by mouth 2 (two) times daily.   lidocaine 5 % Commonly known as: Lidoderm Place 1 patch onto the skin daily. Remove & Discard patch within 12 hours or as directed by MD   losartan 25 MG tablet Commonly known as: COZAAR Take 1 tablet (25 mg total) by mouth daily.   methocarbamol 500 MG tablet Commonly known as: ROBAXIN Take 1 tablet (500 mg total) by mouth every 8 (eight) hours as needed for muscle spasms.   multivitamin with minerals Tabs tablet Take 1 tablet by mouth daily. Start taking on: October 15, 2021   Spiriva Respimat 2.5 MCG/ACT Aers Generic drug: Tiotropium Bromide Monohydrate Inhale 2 puffs into the lungs daily.   Spiriva Respimat 2.5 MCG/ACT Aers Generic drug: Tiotropium Bromide Monohydrate Inhale 2 puffs into the lungs daily.   thiamine 100 MG tablet Take 1 tablet (100 mg total) by mouth daily. Start taking on: October 15, 2021   vitamin B-12 1000 MCG tablet Commonly known as: CYANOCOBALAMIN Take 1 tablet (1,000 mcg total) by mouth daily.               Durable Medical Equipment  (From admission, onward)           Start     Ordered   10/14/21 1034  For home use only DME Walker rolling  Once       Question Answer  Comment  Walker: With [place] Whe[facility]   Patient needs a walker to treat with the following condition TIA (transient ischemic attack)      10/14/21 1034   10/12/21 2155  For home use only DME oxygen  Once       Comments: Oxygen level 85% on RA at rest Oxygen level 82% while ambulating with exertion Oxygen sat 94% on 3L [place] while ambulatin  Question Answer Comment  Length of Need Lifetime   Mode or (Route) Nasal cannula   Liters per Minute 3   Frequency Continuous (stationary and portable oxygen unit needed)   Oxygen conserving device Yes   Oxygen delivery system Gas      10/12/21 2154            Follow-up Information     [facility]. Go to .   Specialty:  Emergency Medicine Why: If symptoms worsen Contact information: [address] [id number] [place] [facility]. Call .   Specialty: Pulmonology Why: Call this number tomorrow to make an appointment for your low oxygen levels and the masses (possible cancer) seen on your lung scans Contact information: [place] [address] [phone]        Medicine, [facility] Follow up on 10/29/2021.   Specialty: Family Medicine Why: Your appointment is at 10:45 AM. Contact information: [place] [address] [phone]         Care, [facility] Follow up.   Specialty: [place] Why: The home health agency will contact you for the first home visit Contact information: [place] [address] [phone]                 Time coordinating discharge: 39 minutes  Signed:  Yaritzel Stange  [facility] 10/14/2021, 4:41 PM

## 2021-10-14 NOTE — Telephone Encounter (Signed)
Please send letter to patient to schedule appointment with me urgently regarding his lung mass.

## 2021-10-14 NOTE — TOC Transition Note (Signed)
Transition of Care [facility]) - CM/SW Discharge Note   Patient Details  Name: Raynold Blankenbaker MRN: [id number] Date of Birth: 1971-07-01  [facility] CM/SW Contact:  Pollie Friar, RN Phone Number: 10/14/2021, 10:49 AM   Clinical Narrative:    Patient is discharging home with home health services through [facility]. Stacey with [facility] accepted the referral. Information on the AVS. Pt has qualified for home oxygen. CM has sent referral to [facility]. They will set up home oxygen for the patient and deliver a transport tank to the room with a rolling walker.  Pt state he has support at home with his spouse, son and step daughter. He denies issues with transportation.  Pt was not taking meds at home as he had run out. He has recently established with [facility]. Information for his next appointment is on the AVS. Pt has transport home when discharged.   Final next level of care: Home w Home Health Services Barriers to Discharge: No Barriers Identified   Patient Goals and CMS Choice   CMS Medicare.gov Compare Post Acute Care list provided to:: Patient Choice offered to / list presented to : Patient  Discharge Placement                       Discharge Plan and Services                DME Arranged: Walker rolling, Oxygen DME Agency: [employer] Date DME Agency Contacted: 10/14/21   Representative spoke with at DME Agency: [place]: PT [place]: [place] Date [place]: 10/14/21   Representative spoke with at [place]: [place] ([place]) Interventions     Readmission Risk Interventions No flowsheet data found.

## 2021-10-14 NOTE — Telephone Encounter (Signed)
[place] [facility] Documentation  I received fax regarding Mr. Mittleman recent admission for right sided weakness. Fortunately stroke was ruled out. Work-up included CTA 10/12/21 which showed  interval grow of prior right upper lobe nodule from 1 cm to 2.5 cm and right major fissure nodule from 2.5 to to 7.5 cm lung mass with right hilar, right paratracheal and subcarinal adenopathy.   Findings concerning for malignancy. Initial lung nodules were seen in May 2021 on PET with plan for biopsy of hypermetabolic lesions. Unfortunately patient was lost to follow-up. He was admitted on 10/12/21 as noted above. After contacting the discharge physician, he stated patient wanted to leave as quickly as possible. He was sent home on oxygen (?new since 2021).  I have attempted to call and arrange follow-up with patient however phone rings and disconnects. Will send patient letter regarding lung mass and need for diagnostic testing urgently.  Rodman Pickle, M.D. [facility] Pulmonary/Critical Care Medicine 10/14/2021 6:09 PM   See Amion for personal pager For hours between 7 PM to 7 AM, please call [place] for urgent questions

## 2021-10-14 NOTE — Progress Notes (Signed)
Nutrition Brief Note  Patient identified on the Malnutrition Screening Tool (MST) Report.  Admitting Dx: TIA (transient ischemic attack) [G45.9] Fall [W19.XXXA] Hypoxia [R09.02] Right sided weakness [R53.1] Pulmonary mass [R91.8] Chest pain, unspecified type [R07.9] PMH:  Past Medical History:  Diagnosis Date   Asthma    Brain bleed ([place])    Hypertension    Seizures ([place])    last episode 03/2013    Medications:   aspirin  300 mg Rectal Daily   Or   aspirin  325 mg Oral Daily   enoxaparin (LOVENOX) injection  40 mg Subcutaneous F09N   folic acid  1 mg Oral Daily   levETIRAcetam  500 mg Oral BID   lidocaine  1 patch Transdermal Daily   multivitamin with minerals  1 tablet Oral Daily   thiamine  100 mg Oral Daily   Or   thiamine  100 mg Intravenous Daily    Labs: Recent Labs  Lab 10/12/21 1800 10/12/21 1807 10/13/21 0643  NA 139 142 134*  K 4.3 4.1 4.5  CL 108 107 102  CO2 22  --  23  BUN 6 6 8   CREATININE 0.88 0.80 0.96  CALCIUM 9.1  --  9.0  GLUCOSE 104* 100* 110*    Wt Readings from Last 15 Encounters:  10/12/21 101.6 kg  07/11/20 (!) 101.6 kg  05/09/20 88 kg  04/09/20 88 kg  11/05/19 102 kg  11/03/19 102.1 kg  12/27/17 102.1 kg  12/26/17 102.1 kg  12/26/17 102.1 kg  12/19/17 102.1 kg  09/09/15 87.5 kg  07/15/15 90.7 kg  03/01/15 95.3 kg  12/27/14 102.1 kg  07/30/14 88.5 kg    Body mass index is 33.08 kg/m. Patient meets criteria for obesity based on current BMI.   Current diet order is heart healthy, patient is consuming approximately 75-100% of meals at this time. Discussed pt with RN. Pt to discharge today. No nutrition interventions warranted at this time. If nutrition issues arise, please consult RD.   Larkin Ina, MS, RD, LDN (she/her/hers) RD pager number and weekend/on-call pager number located in [place].

## 2021-10-15 ENCOUNTER — Encounter: Payer: Self-pay | Admitting: *Deleted

## 2021-10-15 NOTE — Telephone Encounter (Signed)
I have mailed letter to the pt asking him to call for urgent appt with Dr Loanne Drilling I tried calling every number listed for pt on DPR and they all ring and then go to a busy signal

## 2021-10-27 ENCOUNTER — Ambulatory Visit: Payer: Medicare HMO | Admitting: Pulmonary Disease

## 2022-01-09 DIAGNOSIS — R0689 Other abnormalities of breathing: Secondary | ICD-10-CM | POA: Diagnosis not present

## 2022-01-15 DIAGNOSIS — I10 Essential (primary) hypertension: Secondary | ICD-10-CM | POA: Diagnosis not present

## 2022-01-22 DIAGNOSIS — R079 Chest pain, unspecified: Secondary | ICD-10-CM | POA: Diagnosis not present

## 2022-01-24 ENCOUNTER — Emergency Department (HOSPITAL_COMMUNITY): Payer: Medicare HMO

## 2022-01-24 ENCOUNTER — Encounter (HOSPITAL_COMMUNITY): Payer: Self-pay | Admitting: Emergency Medicine

## 2022-01-24 ENCOUNTER — Emergency Department (HOSPITAL_COMMUNITY)
Admission: EM | Admit: 2022-01-24 | Discharge: 2022-01-24 | Discharge status: Against Medical Advice | Payer: Medicare HMO | Attending: Emergency Medicine | Admitting: Emergency Medicine

## 2022-01-24 DIAGNOSIS — R0789 Other chest pain: Secondary | ICD-10-CM | POA: Diagnosis not present

## 2022-01-24 DIAGNOSIS — J45909 Unspecified asthma, uncomplicated: Secondary | ICD-10-CM | POA: Diagnosis not present

## 2022-01-24 DIAGNOSIS — Z20822 Contact with and (suspected) exposure to covid-19: Secondary | ICD-10-CM | POA: Insufficient documentation

## 2022-01-24 DIAGNOSIS — D72829 Elevated white blood cell count, unspecified: Secondary | ICD-10-CM | POA: Diagnosis not present

## 2022-01-24 DIAGNOSIS — I493 Ventricular premature depolarization: Secondary | ICD-10-CM | POA: Diagnosis not present

## 2022-01-24 DIAGNOSIS — R079 Chest pain, unspecified: Secondary | ICD-10-CM | POA: Diagnosis not present

## 2022-01-24 DIAGNOSIS — R0602 Shortness of breath: Secondary | ICD-10-CM | POA: Diagnosis not present

## 2022-01-24 DIAGNOSIS — I10 Essential (primary) hypertension: Secondary | ICD-10-CM | POA: Insufficient documentation

## 2022-01-24 DIAGNOSIS — R918 Other nonspecific abnormal finding of lung field: Secondary | ICD-10-CM | POA: Insufficient documentation

## 2022-01-24 DIAGNOSIS — R0902 Hypoxemia: Secondary | ICD-10-CM | POA: Diagnosis not present

## 2022-01-24 DIAGNOSIS — J439 Emphysema, unspecified: Secondary | ICD-10-CM | POA: Diagnosis not present

## 2022-01-24 DIAGNOSIS — F17210 Nicotine dependence, cigarettes, uncomplicated: Secondary | ICD-10-CM | POA: Insufficient documentation

## 2022-01-24 LAB — CBC WITH DIFFERENTIAL/PLATELET
Abs Immature Granulocytes: 0.07 10*3/uL (ref 0.00–0.07)
Basophils Absolute: 0 10*3/uL (ref 0.0–0.1)
Basophils Relative: 0 %
Eosinophils Absolute: 0 10*3/uL (ref 0.0–0.5)
Eosinophils Relative: 0 %
HCT: 47.7 % (ref 39.0–52.0)
Hemoglobin: 15.3 g/dL (ref 13.0–17.0)
Immature Granulocytes: 1 %
Lymphocytes Relative: 19 %
Lymphs Abs: 2.1 10*3/uL (ref 0.7–4.0)
MCH: 29.4 pg (ref 26.0–34.0)
MCHC: 32.1 g/dL (ref 30.0–36.0)
MCV: 91.6 fL (ref 80.0–100.0)
Monocytes Absolute: 1.4 10*3/uL — ABNORMAL HIGH (ref 0.1–1.0)
Monocytes Relative: 12 %
Neutro Abs: 7.7 10*3/uL (ref 1.7–7.7)
Neutrophils Relative %: 68 %
Platelets: 337 10*3/uL (ref 150–400)
RBC: 5.21 MIL/uL (ref 4.22–5.81)
RDW: 15.9 % — ABNORMAL HIGH (ref 11.5–15.5)
WBC: 11.2 10*3/uL — ABNORMAL HIGH (ref 4.0–10.5)
nRBC: 0 % (ref 0.0–0.2)

## 2022-01-24 LAB — COMPREHENSIVE METABOLIC PANEL
ALT: 10 U/L (ref 0–44)
AST: 17 U/L (ref 15–41)
Albumin: 3 g/dL — ABNORMAL LOW (ref 3.5–5.0)
Alkaline Phosphatase: 75 U/L (ref 38–126)
Anion gap: 7 (ref 5–15)
BUN: 10 mg/dL (ref 6–20)
CO2: 22 mmol/L (ref 22–32)
Calcium: 8.7 mg/dL — ABNORMAL LOW (ref 8.9–10.3)
Chloride: 108 mmol/L (ref 98–111)
Creatinine, Ser: 0.89 mg/dL (ref 0.61–1.24)
GFR, Estimated: >60 mL/min (ref >60)
Glucose, Bld: 98 mg/dL (ref 70–99)
Potassium: 4.7 mmol/L (ref 3.5–5.1)
Sodium: 137 mmol/L (ref 135–145)
Total Bilirubin: 0.5 mg/dL (ref 0.3–1.2)
Total Protein: 7.1 g/dL (ref 6.5–8.1)

## 2022-01-24 LAB — RESP PANEL BY RT-PCR (FLU A&B, COVID) ARPGX2
Influenza A by PCR: NEGATIVE
Influenza B by PCR: NEGATIVE
SARS Coronavirus 2 by RT PCR: NEGATIVE

## 2022-01-24 LAB — BRAIN NATRIURETIC PEPTIDE: B Natriuretic Peptide: 37.6 pg/mL (ref 0.0–100.0)

## 2022-01-24 LAB — TROPONIN I (HIGH SENSITIVITY): Troponin I (High Sensitivity): 12 ng/L (ref <18)

## 2022-01-24 MED ORDER — IOHEXOL 300 MG/ML  SOLN
100.0000 mL | Freq: Once | INTRAMUSCULAR | Status: AC | PRN
  Administered 2022-01-24: 100 mL via INTRAVENOUS

## 2022-01-24 MED ORDER — ACETAMINOPHEN 325 MG PO TABS: 650.0000 mg | ORAL_TABLET | Freq: Four times a day (QID) | ORAL | Status: DC | PRN

## 2022-01-24 NOTE — ED Notes (Signed)
Pt is wanting to leave , MD in to talk with pt

## 2022-01-24 NOTE — ED Provider Notes (Signed)
[facility] Emergency Department Provider Note MRN:  [id number]  Arrival date & time: 01/24/22     Chief Complaint   No chief complaint on file.   History of Present Illness   Richard [name] is a 51 y.o. year-old male with a history of asthma, hypertension, and lung mass presenting to the ED with chief complaint of shortness of breath and chest pain.  The patient presents to our emergency department with a chief complaint of right-sided chest pain, shortness of breath.  Patient arrives via EMS.  EMS reports that the patient was not on his supplemental oxygen when they arrived at his home today.  The patient states that he is on 3 L of nasal cannula at baseline.  The patient reports that his chest pain is located on the right side of his chest.  The pain is sharp.  The pain is worse with inspiration.  The pain does not radiate.  The pain is not associated with nausea or diaphoresis.  Patient states that he has been chronically short of breath but feels better when he wears supplemental oxygen.  Patient has noticed that he has had mild bilateral lower extremity swelling.  He denies that he has had fevers or chills, cough, abdominal pain, new rashes or nausea vomiting.  Review of Systems  A thorough review of systems was obtained and all systems are negative except as noted in the HPI and PMH.   Patient's Health History    Past Medical History:  Diagnosis Date   Asthma    Brain bleed ([place])    Hypertension    Seizures ([place])    last episode 03/2013    Past Surgical History:  Procedure Laterality Date   LEG SURGERY      Family History  Problem Relation Age of Onset   Cancer Father    Diabetes Mellitus II Sister     Social History   Socioeconomic History   Marital status: Married    Spouse name: Lattie Haw   Number of children: 1   Years of education: HS   Highest education level: Not on file  Occupational History    Employer: OTHER    Comment: n/a  Tobacco  Use   Smoking status: Every Day    Packs/day: 1.50    Years: 34.00    Pack years: 51.00    Types: Cigarettes    Start date: 1987   Smokeless tobacco: Never   Tobacco comments:    04/09/20 12-14 cigs per day   Vaping Use   Vaping Use: Never used  Substance and Sexual Activity   Alcohol use: Yes   Drug use: Not Currently    Types: Cocaine    Comment: last use several years ago    Sexual activity: Yes  Other Topics Concern   Not on file  Social History Narrative   Patient lives at home alone.   Caffeine Use: 4-5 cups daily   Social Determinants of Health   Financial Resource Strain: Not on file  Food Insecurity: Not on file  Transportation Needs: Not on file  Physical Activity: Not on file  Stress: Not on file  Social Connections: Not on file  Intimate Partner Violence: Not on file     Physical Exam   Physical Exam Vitals and nursing note reviewed.  Constitutional:      Appearance: Normal appearance.  Cardiovascular:     Rate and Rhythm: Normal rate and regular rhythm.  Pulmonary:     Effort:  Pulmonary effort is normal. No respiratory distress.     Breath sounds: Normal breath sounds. No wheezing.  Chest:     Chest wall: Tenderness present.  Abdominal:     General: Abdomen is flat. There is no distension.     Palpations: Abdomen is soft.     Tenderness: There is no abdominal tenderness.  Skin:    Capillary Refill: Capillary refill takes less than 2 seconds.  Neurological:     General: No focal deficit present.     Mental Status: He is alert. Mental status is at baseline.     Cranial Nerves: No cranial nerve deficit.     Sensory: No sensory deficit.     Motor: No weakness.      Diagnostic and Interventional Summary    EKG Interpretation  Date/Time:  Saturday January 24 2022 20:29:36 EST Ventricular Rate:  88 PR Interval:  160 QRS Duration: 94 QT Interval:  380 QTC Calculation: 460 R Axis:   51 Text Interpretation: Sinus rhythm Probable left atrial  enlargement Left ventricular hypertrophy Anterior Q waves, possibly due to LVH No significant change since last tracing Confirmed by Varney Biles [phone]) on 01/24/2022 8:40:02 PM       Labs Reviewed  CBC WITH DIFFERENTIAL/PLATELET - Abnormal; Notable for the following components:      Result Value   WBC 11.2 (*)    RDW 15.9 (*)    Monocytes Absolute 1.4 (*)    All other components within normal limits  COMPREHENSIVE METABOLIC PANEL - Abnormal; Notable for the following components:   Calcium 8.7 (*)    Albumin 3.0 (*)    All other components within normal limits  RESP PANEL BY RT-PCR (FLU A&B, COVID) ARPGX2  BRAIN NATRIURETIC PEPTIDE  TROPONIN I (HIGH SENSITIVITY)  TROPONIN I (HIGH SENSITIVITY)    CT Angio Chest PE W and/or Wo Contrast  Final Result    DG Chest Portable 1 View  Final Result      Medications  acetaminophen (TYLENOL) tablet 650 mg (has no administration in time range)  iohexol (OMNIPAQUE) 300 MG/ML solution 100 mL (100 mLs Intravenous Contrast Given 01/24/22 2234)     Procedures  /  Critical Care Procedures  ED Course and Medical Decision Making  Initial Impression and Ddx Patient presents to emergency department for evaluation of chest pain.  Differential diagnosis includes but is not limited to the following: Pulmonary embolism, pain from lung mass.  ACS, pneumonia, pneumothorax.  We will obtain labs and imaging to further evaluate  Past medical/surgical history that increases complexity of ED encounter: As noted in HPI  Interpretation of Diagnostics I personally reviewed the EKG, Chest Xray, and Cardiac Monitor and my interpretation is as follows: Sinus rhythm on EKG without acute ST elevations.  Chest x-ray reveals the patient's pulmonary mass in the right hemithorax.  Remains in normal sinus rhythm on cardiac monitor.    CBC with mild leukocytosis.  CMP within normal limits.  Troponin 12.  Repeat troponin was pending at the time that the patient  decided to leave AMA  COVID/flu negative.  She was made aware that a mass was seen on his chest x-ray.  I made the patient aware that we recommended further characterization of this mass with CT imaging to facilitate our pulmonology team and obtaining tissue.  The patient made claims that he will was unaware of the presence of this mass despite evidence of this on CT imaging and March 2021 and documented conversations discussing this  with the patient.  Patient agreed to stay in our emergency department to obtain CT imaging to further evaluate this. CT redemonstrated the patient's spiculated lung mass without evidence of pulmonary embolism.  Patient Reassessment and Ultimate Disposition/Management  the patient's work-up was incomplete following CT imaging.  I recommended the patient stay for repeat troponin however he elected to leave.  I also recommended that the patient be admitted for further evaluation of his lung mass and for consultation with pulmonology and oncology.  The patient elected to leave AMA and follow-up with the pulmonologist as an outpatient.  I agreed to place this referral but again stated that I was recommending that the patient leave.  He was able to state the risk and benefits of staying in the hospital and again elected to leave AMA  Patient management required discussion with the following services or consulting groups:  None  Complexity of Problems Addressed Acute illness or injury that poses threat of life of bodily function  Additional Data Reviewed and Analyzed Further history obtained from: Recent PCP notes and Recent Consult notes  Factors Impacting ED Encounter Risk Consideration of hospitalization    Final Clinical Impressions(s) / ED Diagnoses     ICD-10-CM   1. Lung mass  R91.8       ED Discharge Orders          Ordered    Ambulatory referral to Pulmonology        01/24/22 2308             Discharge Instructions Discussed with and  Provided to Patient:  [facility]   Discharge Instructions      It is recommended that you stay in the emergency department for admission to the hospital however since you refused this I recommend that you please follow-up with pulmonology. Referral was placed in the emergency department.        Zachery Dakins, MD 01/24/22 [place], Ankit, MD 01/24/22 [phone]

## 2022-01-24 NOTE — ED Triage Notes (Signed)
Pt here from home with c/o right side chest pain and sob , pt has O2 prn at home , ems states that pt did not have it on , was placed on 3 liters sats came up from 84 to 96 , also ran out of his b/p  meds this week

## 2022-01-24 NOTE — Discharge Instructions (Addendum)
It is recommended that you stay in the emergency department for admission to the hospital however since you refused this I recommend that you please follow-up with pulmonology. Referral was placed in the emergency department.

## 2022-01-28 ENCOUNTER — Telehealth: Payer: Self-pay | Admitting: Emergency Medicine

## 2022-01-28 ENCOUNTER — Encounter: Payer: Self-pay | Admitting: Emergency Medicine

## 2022-01-28 ENCOUNTER — Ambulatory Visit (INDEPENDENT_AMBULATORY_CARE_PROVIDER_SITE_OTHER): Payer: Medicare HMO | Admitting: Emergency Medicine

## 2022-01-28 ENCOUNTER — Other Ambulatory Visit: Payer: Self-pay

## 2022-01-28 VITALS — BP 162/86 | HR 90 | Temp 98.5°F | Ht 69.0 in | Wt 203.4 lb

## 2022-01-28 DIAGNOSIS — J9601 Acute respiratory failure with hypoxia: Secondary | ICD-10-CM

## 2022-01-28 DIAGNOSIS — F17200 Nicotine dependence, unspecified, uncomplicated: Secondary | ICD-10-CM

## 2022-01-28 DIAGNOSIS — R918 Other nonspecific abnormal finding of lung field: Secondary | ICD-10-CM | POA: Diagnosis not present

## 2022-01-28 DIAGNOSIS — J449 Chronic obstructive pulmonary disease, unspecified: Secondary | ICD-10-CM

## 2022-01-28 DIAGNOSIS — J441 Chronic obstructive pulmonary disease with (acute) exacerbation: Secondary | ICD-10-CM | POA: Insufficient documentation

## 2022-01-28 MED ORDER — STIOLTO RESPIMAT 2.5-2.5 MCG/ACT IN AERS: 2.0000 | INHALATION_SPRAY | Freq: Every day | RESPIRATORY_TRACT | 0 refills | Status: DC

## 2022-01-28 NOTE — Assessment & Plan Note (Signed)
COPD, presumed severe, not on BD therapy at this time. Need to restart Stiolto, start albuterol as needed.  Smoking cessation.  Good oxygen compliance

## 2022-01-28 NOTE — Telephone Encounter (Signed)
Pt has been scheduled for 2/20 at 2:30 at [facility] End[facility].  Pt will go for covid test on 2/17.  Gave appt info to pt with letter.

## 2022-01-28 NOTE — Assessment & Plan Note (Signed)
Right lower lobe nodule, now mass lost to follow-up but almost certainly malignancy.  He needs diagnosis ASAP.  I am trying to get him set up for EBUS/bronc on 2/20 if we can squeeze him in.  He will need a note to say he needs to be out of work until we can get this sorted out.  We will work on setting up bronchoscopy to evaluate your right lung abnormality as soon as possible.  We will try to get this set up for 02/02/2022 or 02/09/2022.  This will be done as an outpatient under general anesthesia.  You will need a designated driver.  You would need to stop your aspirin 2 days prior to the test. We will restart Stiolto 2 puffs once daily. You need to use your oxygen at 2-3 L/min with all exertion. We will write a letter to indicate that you cannot work at this time.  The time of return and the kind of work you will be able to do will depend on your ongoing medical work-up. Follow with Dr [name] in 1 month

## 2022-01-28 NOTE — Assessment & Plan Note (Signed)
Desperately needs cessation.  We did not address today but will have to talk about this going forward.

## 2022-01-28 NOTE — Progress Notes (Signed)
Subjective:    Patient ID: Richard [name], male    DOB: [birth year], 51 y.o.   MRN: [id number]  HPI 51 year old gentleman with a history of tobacco use (50 pack years), COPD/asthma, hypertension, ICH, seizures (/2014), prior right lower extremity DVT (over 10 years ago), alcohol abuse, hypoxemic respiratory failure.  He was in the ED on 01/24/2022 when he was noted to be experiencing dyspnea and chest discomfort. He had pulmonary nodular disease identified in 04/2020, PET scan was done at that time.  He was being considered for biopsy but was lost to follow-up until he was admitted in 09/2021.  At that time there was interval growth of right upper lobe nodule from 1 cm to 2.5 cm and a right major fissure nodule now 7.5 cm with right hilar and right paratracheal involvement..  Again it looks like he was lost to follow-up.  He was back in the emergency department 2/11 as above and a CTPA was performed, as below.  CT-PA 01/24/2022 reviewed by me, showed large right lung mass transgressing the major fissure into the mediastinum, 10.6 x 9.7 cm.  There is a 2 x 3 cm satellite mass in the right upper lobe, moderate centrilobular emphysematous changes   Review of Systems As per HPi  Past Medical History:  Diagnosis Date   Asthma    Brain bleed ([place])    Hypertension    Seizures ([place])    last episode 03/2013     Family History  Problem Relation Age of Onset   Cancer Father    Diabetes Mellitus II Sister    No family history lung cancer  Social History   Socioeconomic History   Marital status: Married    Spouse name: Lattie Haw   Number of children: 1   Years of education: HS   Highest education level: Not on file  Occupational History    Employer: OTHER    Comment: n/a  Tobacco Use   Smoking status: Every Day    Packs/day: 1.50    Years: 34.00    Pack years: 51.00    Types: Cigarettes    Start date: 1987   Smokeless tobacco: Never   Tobacco comments:    1 pack smoked daily [facility]J 01/28/22   Vaping Use   Vaping Use: Never used  Substance and Sexual Activity   Alcohol use: Yes   Drug use: Not Currently    Types: Cocaine    Comment: last use several years ago    Sexual activity: Yes  Other Topics Concern   Not on file  Social History Narrative   Patient lives at home alone.   Caffeine Use: 4-5 cups daily   Social Determinants of Health   Financial Resource Strain: Not on file  Food Insecurity: Not on file  Transportation Needs: Not on file  Physical Activity: Not on file  Stress: Not on file  Social Connections: Not on file  Intimate Partner Violence: Not on file    His work Architect, Materials engineer, concrete, Biomedical scientist, Nurse, adult   Allergies  Allergen Reactions   Dilaudid [Hydromorphone Hcl] Nausea Only and Other (See Comments)    Bradycardia and hyperthermia, too   Tape Itching and Dermatitis     Outpatient Medications Prior to Visit  Medication Sig Dispense Refill   Artificial Tear Ointment (DRY EYES OP) Place 1-2 drops into both eyes 2 (two) times daily as needed (dry eyes).     aspirin EC 325 MG EC tablet Take 1 tablet (325 mg total) by  mouth daily. Swallow whole. 90 tablet 2   Aspirin-Acetaminophen-Caffeine 500-325-65 MG PACK Take 1-3 packets by mouth daily as needed (for pain).      carbamide peroxide (DEBROX) 6.5 % OTIC solution Place 2 drops into both ears at bedtime as needed (ear pain).     cloNIDine (CATAPRES) 0.3 MG tablet Take 1 tablet (0.3 mg total) by mouth 2 (two) times daily. 60 tablet 0   folic acid (FOLVITE) 1 MG tablet Take 1 tablet (1 mg total) by mouth daily. 100 tablet 0   lidocaine (LIDODERM) 5 % Place 1 patch onto the skin daily. Remove & Discard patch within 12 hours or as directed by MD 30 patch 0   losartan (COZAAR) 25 MG tablet Take 1 tablet (25 mg total) by mouth daily. 30 tablet 0   methocarbamol (ROBAXIN) 500 MG tablet Take 1 tablet (500 mg total) by mouth every 8 (eight) hours as needed for muscle spasms. 15 tablet 0    Multiple Vitamin (MULTIVITAMIN WITH MINERALS) TABS tablet Take 1 tablet by mouth daily. 100 tablet 0   OXYGEN Inhale 2 L/min into the lungs as needed (for shortness of breath).     thiamine 100 MG tablet Take 1 tablet (100 mg total) by mouth daily. 100 tablet 0   vitamin B-12 (CYANOCOBALAMIN) 1000 MCG tablet Take 1 tablet (1,000 mcg total) by mouth daily. 30 tablet 0   levETIRAcetam (KEPPRA) 500 MG tablet Take 1 tablet (500 mg total) by mouth 2 (two) times daily. 60 tablet 2   Tiotropium Bromide Monohydrate (SPIRIVA RESPIMAT) 2.5 MCG/ACT AERS Inhale 2 puffs into the lungs daily. (Patient not taking: Reported on 01/28/2022) 4 g 6   Tiotropium Bromide Monohydrate (SPIRIVA RESPIMAT) 2.5 MCG/ACT AERS Inhale 2 puffs into the lungs daily. (Patient not taking: Reported on 01/28/2022) 4 g 0   No facility-administered medications prior to visit.         Objective:   Physical Exam Vitals:   01/28/22 1032  BP: (!) 162/86  Pulse: 90  Temp: 98.5 F (36.9 C)  TempSrc: Oral  SpO2: 90%  Weight: 203 lb 6.4 oz (92.3 kg)  Height: 5\' 9"  (1.753 m)    Gen: Pleasant, well-nourished, in no distress,  normal affect  ENT: No lesions,  mouth clear,  oropharynx clear, no postnasal drip  Neck: No JVD, no stridor  Lungs: No use of accessory muscles, bilateral inspiratory and expiratory rhonchi, expiratory wheeze on forced expiration, decreased in his right base  Cardiovascular: RRR, heart sounds normal, no murmur or gallops, no peripheral edema  Musculoskeletal: No deformities, no cyanosis or clubbing  Neuro: alert, awake, non focal  Skin: Warm, no lesions or rash      Assessment & Plan:   Right lower lobe lung mass Right lower lobe nodule, now mass lost to follow-up but almost certainly malignancy.  He needs diagnosis ASAP.  I am trying to get him set up for EBUS/bronc on 2/20 if we can squeeze him in.  He will need a note to say he needs to be out of work until we can get this sorted out.  We  will work on setting up bronchoscopy to evaluate your right lung abnormality as soon as possible.  We will try to get this set up for 02/02/2022 or 02/09/2022.  This will be done as an outpatient under general anesthesia.  You will need a designated driver.  You would need to stop your aspirin 2 days prior to the test. We will restart  Stiolto 2 puffs once daily. You need to use your oxygen at 2-3 L/min with all exertion. We will write a letter to indicate that you cannot work at this time.  The time of return and the kind of work you will be able to do will depend on your ongoing medical work-up. Follow with Dr [name] in 1 month  TOBACCO ABUSE Desperately needs cessation.  We did not address today but will have to talk about this going forward.  Acute respiratory failure with hypoxemia (HCC) Hypoxemic respiratory failure due to COPD, lung mass.  He has poor oxygen compliance.  Needs to use his O2 with all exertion.  COPD (chronic obstructive pulmonary disease) (HCC) COPD, presumed severe, not on BD therapy at this time. Need to restart Stiolto, start albuterol as needed.  Smoking cessation.  Good oxygen compliance   Baltazar Apo, MD, PhD 01/28/2022, 11:06 AM [facility] [phone] or if no answer before 7:00PM call [phone] For any issues after 7:00PM please call eLink [phone]

## 2022-01-28 NOTE — Patient Instructions (Addendum)
We will work on setting up bronchoscopy to evaluate your right lung abnormality as soon as possible.  We will try to get this set up for 02/02/2022 or 02/09/2022.  This will be done as an outpatient under general anesthesia.  You will need a designated driver.  You would need to stop your aspirin 2 days prior to the test. We will restart Stiolto 2 puffs once daily. You need to use your oxygen at 2-3 L/min with all exertion. We will write a letter to indicate that you cannot work at this time.  The time of return and the kind of work you will be able to do will depend on your ongoing medical work-up. Follow with Dr [name] in 1 month

## 2022-01-28 NOTE — Assessment & Plan Note (Signed)
Hypoxemic respiratory failure due to COPD, lung mass.  He has poor oxygen compliance.  Needs to use his O2 with all exertion.

## 2022-01-29 ENCOUNTER — Encounter (HOSPITAL_COMMUNITY): Payer: Self-pay | Admitting: Emergency Medicine

## 2022-01-29 ENCOUNTER — Other Ambulatory Visit: Payer: Self-pay

## 2022-01-29 NOTE — Progress Notes (Addendum)
Mr. Ashe denies chest pain, not short of breath at this time.  Mr. Kornegay has oxygen that he wears at night. Patient denies having any s/s of Covid in his household.  Patient denies any known exposure to Covid. Mr. Tiemann states that he was told that he can get his Covid test done on the day of procedure. I gave patient the arrival time of 1130. After I completed the call with Mr. Purk, I checked for letters to see if patient has been instructed to stop Aspirin, there was no mention, but there are instructions instructing  Mr. Budnick to go to [facility] to be tested on 01/30/22.  I called patient back, message, "cannot take your call at this time."  Mr. Wiltgen states that he is on Dilantin, the [facility] has Keppra.  Mr. Isidore is going to the pharmacy to pick up medications, he wull take the anti seizure that he is prescribed on the morning of surgery.  I sent Dr. [name] a staff message asking him if patient is to hold Aspirin, I asked Dr. [name] to have one of his staff members to call patient with information. (I'm out of the office tomorrow.) I also asked for orders.  Mr. Bona does not know who his PCP is. Neurologist :Dr. Sherlyn Lees is to make an appoint with her.  Mr. Sibley is allergic to Dilaudid, patient became Bradycardiac and hyperthermic after taking a pill for headache 10/2012.  I instructed Mr. Foots to shower with antibiotic soap, if it is available.  Dry off with a clean towel. Do not put lotion, powder, cologne or deodorant or makeup.No jewelry or piercings. Men may shave their face and neck. Woman should not shave. No nail polish, artificial or acrylic nails. Wear clean clothes, brush your teeth. Glasses, contact lens,dentures or partials may not be worn in the OR. If you need to wear them, please bring a case for glasses, do not wear contacts or bring a case, the hospital does not have contact cases, dentures or partials will have to be removed , make sure they are clean, we  will provide a denture cup to put them in. You will need some one to drive you home and a responsible person over the age of 53 to stay with you for the first 24 hours after surgery.   I spoke with Ms Manasco, she confirmed that patient was told that he can have Covid test on day of procedure, I gave arrival time of 72. Mrs. Ferreras reported that patient is only taking "baby ASA sometimes."

## 2022-01-30 ENCOUNTER — Encounter (HOSPITAL_COMMUNITY): Payer: Self-pay | Admitting: Vascular Surgery

## 2022-01-30 ENCOUNTER — Encounter (HOSPITAL_COMMUNITY): Payer: Self-pay | Admitting: Emergency Medicine

## 2022-01-30 NOTE — Anesthesia Preprocedure Evaluation (Deleted)
Anesthesia Evaluation    Reviewed: Allergy & Precautions, Patient's Chart, lab work & pertinent test results  History of Anesthesia Complications Negative for: history of anesthetic complications  Airway        Dental   Pulmonary asthma , COPD,  oxygen dependent, Current Smoker,   Lung mass           Cardiovascular hypertension, Pt. on medications + DVT  + Valvular Problems/Murmurs AI    '22 TTE - EF 55 to 60%. Moderate left ventricular hypertrophy of the basal-septal segment. Grade I diastolic  dysfunction (impaired relaxation). Aortic valve regurgitation is mild to moderate. Aortic regurgitation PHT measures 402 msec. There is mild dilatation of the aortic root, measuring 39 mm.     Neuro/Psych  Headaches, Seizures -, Well Controlled,  CVA, Residual Symptoms    GI/Hepatic GERD  ,(+)     substance abuse  alcohol use and cocaine use,   Endo/Other   Obesity   Renal/GU      Musculoskeletal   Abdominal   Peds  Hematology   Anesthesia Other Findings   Reproductive/Obstetrics                           Anesthesia Physical Anesthesia Plan  ASA:   Anesthesia Plan:    Post-op Pain Management:    Induction:   PONV Risk Score and Plan:   Airway Management Planned:   Additional Equipment:   Intra-op Plan:   Post-operative Plan:   Informed Consent:   Plan Discussed with:   Anesthesia Plan Comments: (Case postponed due to hypertensive emergency, BP ~230/145 in preop. Sent to ED for further treatment, Dr. [name] aware.)      Anesthesia Quick Evaluation
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
## 2022-02-02 ENCOUNTER — Ambulatory Visit (HOSPITAL_COMMUNITY): Payer: Medicare HMO

## 2022-02-02 ENCOUNTER — Encounter (HOSPITAL_COMMUNITY)
Admission: RE | Disposition: A | Discharge status: Home / Self Care | Payer: Self-pay | Source: Ambulatory Visit | Attending: Internal Medicine

## 2022-02-02 ENCOUNTER — Encounter (HOSPITAL_COMMUNITY): Payer: Self-pay | Admitting: Internal Medicine

## 2022-02-02 ENCOUNTER — Inpatient Hospital Stay (HOSPITAL_COMMUNITY)
Admission: RE | Admit: 2022-02-02 | Discharge: 2022-02-11 | DRG: 189 | Disposition: A | Discharge status: Home / Self Care | Payer: Medicare HMO | Source: Ambulatory Visit | Attending: Internal Medicine | Admitting: Internal Medicine

## 2022-02-02 ENCOUNTER — Other Ambulatory Visit: Payer: Self-pay

## 2022-02-02 DIAGNOSIS — I10 Essential (primary) hypertension: Secondary | ICD-10-CM | POA: Diagnosis present

## 2022-02-02 DIAGNOSIS — Z7982 Long term (current) use of aspirin: Secondary | ICD-10-CM | POA: Diagnosis not present

## 2022-02-02 DIAGNOSIS — J449 Chronic obstructive pulmonary disease, unspecified: Secondary | ICD-10-CM | POA: Diagnosis not present

## 2022-02-02 DIAGNOSIS — Z888 Allergy status to other drugs, medicaments and biological substances status: Secondary | ICD-10-CM | POA: Diagnosis not present

## 2022-02-02 DIAGNOSIS — I160 Hypertensive urgency: Secondary | ICD-10-CM | POA: Diagnosis present

## 2022-02-02 DIAGNOSIS — J9621 Acute and chronic respiratory failure with hypoxia: Secondary | ICD-10-CM | POA: Diagnosis not present

## 2022-02-02 DIAGNOSIS — Z8673 Personal history of transient ischemic attack (TIA), and cerebral infarction without residual deficits: Secondary | ICD-10-CM | POA: Diagnosis not present

## 2022-02-02 DIAGNOSIS — G40909 Epilepsy, unspecified, not intractable, without status epilepticus: Secondary | ICD-10-CM | POA: Diagnosis not present

## 2022-02-02 DIAGNOSIS — Z87820 Personal history of traumatic brain injury: Secondary | ICD-10-CM

## 2022-02-02 DIAGNOSIS — J440 Chronic obstructive pulmonary disease with acute lower respiratory infection: Secondary | ICD-10-CM | POA: Diagnosis present

## 2022-02-02 DIAGNOSIS — E873 Alkalosis: Secondary | ICD-10-CM | POA: Diagnosis present

## 2022-02-02 DIAGNOSIS — Z7189 Other specified counseling: Secondary | ICD-10-CM

## 2022-02-02 DIAGNOSIS — Z833 Family history of diabetes mellitus: Secondary | ICD-10-CM | POA: Diagnosis not present

## 2022-02-02 DIAGNOSIS — C3431 Malignant neoplasm of lower lobe, right bronchus or lung: Secondary | ICD-10-CM | POA: Diagnosis not present

## 2022-02-02 DIAGNOSIS — Z86718 Personal history of other venous thrombosis and embolism: Secondary | ICD-10-CM

## 2022-02-02 DIAGNOSIS — Z9981 Dependence on supplemental oxygen: Secondary | ICD-10-CM

## 2022-02-02 DIAGNOSIS — C3411 Malignant neoplasm of upper lobe, right bronchus or lung: Secondary | ICD-10-CM | POA: Diagnosis not present

## 2022-02-02 DIAGNOSIS — R609 Edema, unspecified: Secondary | ICD-10-CM | POA: Diagnosis not present

## 2022-02-02 DIAGNOSIS — Z79899 Other long term (current) drug therapy: Secondary | ICD-10-CM

## 2022-02-02 DIAGNOSIS — Z515 Encounter for palliative care: Secondary | ICD-10-CM | POA: Diagnosis not present

## 2022-02-02 DIAGNOSIS — J9601 Acute respiratory failure with hypoxia: Secondary | ICD-10-CM | POA: Diagnosis present

## 2022-02-02 DIAGNOSIS — J438 Other emphysema: Secondary | ICD-10-CM | POA: Diagnosis not present

## 2022-02-02 DIAGNOSIS — J9611 Chronic respiratory failure with hypoxia: Secondary | ICD-10-CM

## 2022-02-02 DIAGNOSIS — I959 Hypotension, unspecified: Secondary | ICD-10-CM | POA: Diagnosis not present

## 2022-02-02 DIAGNOSIS — R918 Other nonspecific abnormal finding of lung field: Secondary | ICD-10-CM | POA: Diagnosis present

## 2022-02-02 DIAGNOSIS — R0602 Shortness of breath: Secondary | ICD-10-CM | POA: Diagnosis not present

## 2022-02-02 DIAGNOSIS — F17210 Nicotine dependence, cigarettes, uncomplicated: Secondary | ICD-10-CM | POA: Diagnosis not present

## 2022-02-02 DIAGNOSIS — J320 Chronic maxillary sinusitis: Secondary | ICD-10-CM | POA: Diagnosis not present

## 2022-02-02 DIAGNOSIS — J432 Centrilobular emphysema: Secondary | ICD-10-CM | POA: Diagnosis not present

## 2022-02-02 DIAGNOSIS — C3481 Malignant neoplasm of overlapping sites of right bronchus and lung: Secondary | ICD-10-CM

## 2022-02-02 DIAGNOSIS — R590 Localized enlarged lymph nodes: Secondary | ICD-10-CM | POA: Diagnosis not present

## 2022-02-02 DIAGNOSIS — D3500 Benign neoplasm of unspecified adrenal gland: Secondary | ICD-10-CM | POA: Diagnosis not present

## 2022-02-02 DIAGNOSIS — J189 Pneumonia, unspecified organism: Secondary | ICD-10-CM

## 2022-02-02 DIAGNOSIS — C3490 Malignant neoplasm of unspecified part of unspecified bronchus or lung: Secondary | ICD-10-CM | POA: Diagnosis not present

## 2022-02-02 DIAGNOSIS — J9610 Chronic respiratory failure, unspecified whether with hypoxia or hypercapnia: Secondary | ICD-10-CM

## 2022-02-02 DIAGNOSIS — Z20822 Contact with and (suspected) exposure to covid-19: Secondary | ICD-10-CM | POA: Diagnosis not present

## 2022-02-02 DIAGNOSIS — J441 Chronic obstructive pulmonary disease with (acute) exacerbation: Secondary | ICD-10-CM | POA: Diagnosis present

## 2022-02-02 HISTORY — DX: Cerebral infarction, unspecified: I63.9

## 2022-02-02 HISTORY — DX: Gastro-esophageal reflux disease without esophagitis: K21.9

## 2022-02-02 HISTORY — DX: Dependence on supplemental oxygen: Z99.81

## 2022-02-02 LAB — COMPREHENSIVE METABOLIC PANEL
ALT: 9 U/L (ref 0–44)
AST: 15 U/L (ref 15–41)
Albumin: 3.3 g/dL — ABNORMAL LOW (ref 3.5–5.0)
Alkaline Phosphatase: 66 U/L (ref 38–126)
Anion gap: 10 (ref 5–15)
BUN: 8 mg/dL (ref 6–20)
CO2: 26 mmol/L (ref 22–32)
Calcium: 9.2 mg/dL (ref 8.9–10.3)
Chloride: 102 mmol/L (ref 98–111)
Creatinine, Ser: 0.9 mg/dL (ref 0.61–1.24)
GFR, Estimated: >60 mL/min (ref >60)
Glucose, Bld: 99 mg/dL (ref 70–99)
Potassium: 4.2 mmol/L (ref 3.5–5.1)
Sodium: 138 mmol/L (ref 135–145)
Total Bilirubin: 0.3 mg/dL (ref 0.3–1.2)
Total Protein: 7.8 g/dL (ref 6.5–8.1)

## 2022-02-02 LAB — CBC WITH DIFFERENTIAL/PLATELET
Abs Immature Granulocytes: 0.04 10*3/uL (ref 0.00–0.07)
Basophils Absolute: 0 10*3/uL (ref 0.0–0.1)
Basophils Relative: 0 %
Eosinophils Absolute: 0 10*3/uL (ref 0.0–0.5)
Eosinophils Relative: 0 %
HCT: 48.9 % (ref 39.0–52.0)
Hemoglobin: 15.5 g/dL (ref 13.0–17.0)
Immature Granulocytes: 0 %
Lymphocytes Relative: 11 %
Lymphs Abs: 1 10*3/uL (ref 0.7–4.0)
MCH: 29.2 pg (ref 26.0–34.0)
MCHC: 31.7 g/dL (ref 30.0–36.0)
MCV: 92.1 fL (ref 80.0–100.0)
Monocytes Absolute: 0.9 10*3/uL (ref 0.1–1.0)
Monocytes Relative: 9 %
Neutro Abs: 7.7 10*3/uL (ref 1.7–7.7)
Neutrophils Relative %: 80 %
Platelets: 335 10*3/uL (ref 150–400)
RBC: 5.31 MIL/uL (ref 4.22–5.81)
RDW: 15.7 % — ABNORMAL HIGH (ref 11.5–15.5)
WBC: 9.6 10*3/uL (ref 4.0–10.5)
nRBC: 0 % (ref 0.0–0.2)

## 2022-02-02 LAB — I-STAT VENOUS BLOOD GAS, ED
Acid-Base Excess: 4 mmol/L — ABNORMAL HIGH (ref 0.0–2.0)
Bicarbonate: 27.6 mmol/L (ref 20.0–28.0)
Calcium, Ion: 0.96 mmol/L — ABNORMAL LOW (ref 1.15–1.40)
HCT: 50 % (ref 39.0–52.0)
Hemoglobin: 17 g/dL (ref 13.0–17.0)
O2 Saturation: 97 %
Potassium: 7.5 mmol/L (ref 3.5–5.1)
Sodium: 133 mmol/L — ABNORMAL LOW (ref 135–145)
TCO2: 29 mmol/L (ref 22–32)
pCO2, Ven: 36.9 mmHg — ABNORMAL LOW (ref 44–60)
pH, Ven: 7.483 — ABNORMAL HIGH (ref 7.25–7.43)
pO2, Ven: 80 mmHg — ABNORMAL HIGH (ref 32–45)

## 2022-02-02 LAB — BRAIN NATRIURETIC PEPTIDE: B Natriuretic Peptide: 34.4 pg/mL (ref 0.0–100.0)

## 2022-02-02 LAB — TROPONIN I (HIGH SENSITIVITY)
Troponin I (High Sensitivity): 11 ng/L (ref <18)
Troponin I (High Sensitivity): 10 ng/L (ref <18)

## 2022-02-02 LAB — CBG MONITORING, ED: Glucose-Capillary: 130 mg/dL — ABNORMAL HIGH (ref 70–99)

## 2022-02-02 LAB — SARS CORONAVIRUS 2 BY RT PCR (HOSPITAL ORDER, PERFORMED IN ~~LOC~~ HOSPITAL LAB): SARS Coronavirus 2: NEGATIVE

## 2022-02-02 SURGERY — CANCELLED PROCEDURE

## 2022-02-02 MED ORDER — NICOTINE POLACRILEX 2 MG MT GUM
2.0000 mg | CHEWING_GUM | OROMUCOSAL | Status: DC | PRN
  Administered 2022-02-02: 2 mg via ORAL
  Filled 2022-02-02 (×2): qty 1

## 2022-02-02 MED ORDER — UMECLIDINIUM-VILANTEROL 62.5-25 MCG/ACT IN AEPB
1.0000 | INHALATION_SPRAY | Freq: Every day | RESPIRATORY_TRACT | Status: DC
  Administered 2022-02-03 – 2022-02-11 (×9): 1 via RESPIRATORY_TRACT
  Filled 2022-02-02 (×4): qty 14

## 2022-02-02 MED ORDER — ALBUTEROL SULFATE (2.5 MG/3ML) 0.083% IN NEBU
2.5000 mg | INHALATION_SOLUTION | RESPIRATORY_TRACT | Status: DC | PRN
  Administered 2022-02-03 – 2022-02-06 (×4): 2.5 mg via RESPIRATORY_TRACT
  Filled 2022-02-02 (×5): qty 3

## 2022-02-02 MED ORDER — ORAL CARE MOUTH RINSE
15.0000 mL | Freq: Two times a day (BID) | OROMUCOSAL | Status: DC
  Administered 2022-02-03 – 2022-02-04 (×3): 15 mL via OROMUCOSAL

## 2022-02-02 MED ORDER — CHLORHEXIDINE GLUCONATE CLOTH 2 % EX PADS
6.0000 | MEDICATED_PAD | Freq: Every day | CUTANEOUS | Status: DC
  Administered 2022-02-03 – 2022-02-11 (×7): 6 via TOPICAL

## 2022-02-02 MED ORDER — IOHEXOL 350 MG/ML SOLN
80.0000 mL | Freq: Once | INTRAVENOUS | Status: AC | PRN
  Administered 2022-02-02: 80 mL via INTRAVENOUS

## 2022-02-02 MED ORDER — HYDRALAZINE HCL 20 MG/ML IJ SOLN
10.0000 mg | INTRAMUSCULAR | Status: DC | PRN
  Administered 2022-02-03 (×2): 10 mg via INTRAVENOUS
  Filled 2022-02-02 (×2): qty 1

## 2022-02-02 MED ORDER — NICOTINE 14 MG/24HR TD PT24
14.0000 mg | MEDICATED_PATCH | Freq: Every day | TRANSDERMAL | Status: DC
  Administered 2022-02-02 – 2022-02-11 (×10): 14 mg via TRANSDERMAL
  Filled 2022-02-02 (×10): qty 1

## 2022-02-02 MED ORDER — SODIUM CHLORIDE 0.9 % IV SOLN
2.0000 g | INTRAVENOUS | Status: DC
  Administered 2022-02-02 – 2022-02-10 (×9): 2 g via INTRAVENOUS
  Filled 2022-02-02 (×10): qty 20

## 2022-02-02 MED ORDER — CHLORHEXIDINE GLUCONATE 0.12 % MT SOLN
15.0000 mL | Freq: Two times a day (BID) | OROMUCOSAL | Status: DC
  Administered 2022-02-03 – 2022-02-04 (×3): 15 mL via OROMUCOSAL
  Filled 2022-02-02 (×3): qty 15

## 2022-02-02 MED ORDER — ACETAMINOPHEN 500 MG PO TABS: 1000.0000 mg | ORAL_TABLET | Freq: Once | ORAL | Status: DC

## 2022-02-02 MED ORDER — ACETAMINOPHEN 650 MG RE SUPP: 650.0000 mg | Freq: Four times a day (QID) | RECTAL | Status: DC | PRN

## 2022-02-02 MED ORDER — ACETAMINOPHEN 325 MG PO TABS
650.0000 mg | ORAL_TABLET | Freq: Four times a day (QID) | ORAL | Status: DC | PRN
  Filled 2022-02-02: qty 2

## 2022-02-02 MED ORDER — SODIUM CHLORIDE 0.9 % IV SOLN
500.0000 mg | Freq: Once | INTRAVENOUS | Status: AC
  Administered 2022-02-02: 500 mg via INTRAVENOUS
  Filled 2022-02-02: qty 5

## 2022-02-02 MED ORDER — KETOROLAC TROMETHAMINE 15 MG/ML IJ SOLN
15.0000 mg | Freq: Once | INTRAMUSCULAR | Status: AC
  Administered 2022-02-02: 15 mg via INTRAVENOUS
  Filled 2022-02-02: qty 1

## 2022-02-02 MED ORDER — LEVETIRACETAM IN NACL 500 MG/100ML IV SOLN
500.0000 mg | Freq: Two times a day (BID) | INTRAVENOUS | Status: DC
  Administered 2022-02-03 – 2022-02-11 (×18): 500 mg via INTRAVENOUS
  Filled 2022-02-02 (×19): qty 100

## 2022-02-02 MED ORDER — HYDRALAZINE HCL 20 MG/ML IJ SOLN
10.0000 mg | Freq: Once | INTRAMUSCULAR | Status: AC
  Administered 2022-02-02: 10 mg via INTRAVENOUS
  Filled 2022-02-02: qty 1

## 2022-02-02 MED ORDER — LACTATED RINGERS IV SOLN: INTRAVENOUS | Status: DC

## 2022-02-02 NOTE — Progress Notes (Signed)
PCCM Note  Mr. Perrier is here for bronchoscopy to achieve a tissue diagnosis for large right lower lobe mass.  On preop evaluation his blood pressure 230/145.  He has headache but no other complaints.  No visual changes, etc.  I do not think it safe for him to undergo procedure with this degree of hypertension.  Agree with anesthesia plans to send him to the emergency department for stabilization.  I suspect that he will need to be admitted to the hospital to get his blood pressure under control.  I can try to help arrange for bronchoscopy as an inpatient once his blood pressure is controlled.  This is the plan that I have recommended to him because I want to avoid any further delays in his tissue diagnosis.  Otherwise I will have to reschedule him as an outpatient in my standard Monday schedule.   Baltazar Apo, MD, PhD 02/02/2022, 1:01 PM [facility] [phone] or if no answer before 7:00PM call [phone] For any issues after 7:00PM please call eLink [phone]

## 2022-02-02 NOTE — Hospital Course (Signed)
Past Medical History:  Diagnosis Date   Asthma    Brain bleed ([place])    DVT (deep venous thrombosis) ([place]) 02/19/2015   RLE   GERD (gastroesophageal reflux disease)    History of home oxygen therapy    Hypertension    Seizures ([place])    last episode 03/2013   Stroke [facility])    Home oxygen requirement?    Acute respiratory failure: - new diagnosis of lung cancer and mets  - Masses compressing Rt lower lobe  - Upset and not wanting to stay in the hospital - HTN and Hypoxic and sent to the ED from the procedure.  - AB due to question of pneumonia on CT  -   There is 10.2 cm mass in the posteromedial right mid and right lower lung fields suggesting primary malignant neoplasm with extension to the subcarinal region of mediastinum. There is satellite nodule lateral to the large mass in the right lung suggesting pulmonary metastatic disease.   There are new patchy infiltrates in the posteroinferior right lower lobe suggesting atelectasis/pneumonia.   There is no evidence of pulmonary artery embolism. There is ectasia of main pulmonary artery suggesting pulmonary arterial hypertension. There is extrinsic compression of segmental and subsegmental branches in the right lower lobe caused by malignant neoplasm. There is no evidence of thoracic aortic dissection. There is ectasia of ascending thoracic aorta.   There is hyperplasia with possible nodules in both adrenals. Findings suggest possible incidental adenomas or metastatic disease.

## 2022-02-02 NOTE — ED Notes (Incomplete)
Pt has been adamantly demanding his clothes and saying he is going home. Pt remains hypertensive, hypoxic on 4 to 6 LPM per [place], tachycardic and tachypneic. RN retrieved Drs Jeanell Sparrow and Greta Doom and had bedside discussion with pt regarding his concerning vital signs and presentation to ED. Pt states he is still ready to go home, he will see his doctor in the outpatient setting. Awaiting arrival of pt's wife at this time. Pt on reported home oxygen level of 4LPM per [place], saturations currently

## 2022-02-02 NOTE — ED Notes (Signed)
Pt has been adamant about leaving and going home. Says he is hungry, doesn't need to be here, very stubborn and not grasping that his oxygen status is in a critical state. RN obtained nicotine supplement orders as pt admitted this was a factor. RN retrieved Dr Jeanell Sparrow and Dr Greta Doom and had bedside meeting with pt regarding critical status, need for imaging and labs. Pt has allowed labs to be drawn and CXR. Pt stating he needs to eat and drink or he will leave. Pt was on home ordered 2LPM per [place] and O2 dropped as low as 73%. Currently on 4LPM at 86%. MDs aware. Pt still agitated and stating he needs to leave. Pt's wife arrives and is at bedside, she was updated on current situation and plan of care by Dr Greta Doom.

## 2022-02-02 NOTE — ED Notes (Signed)
Pt's wife brought pt snacks and a drink, RN updated Dr Greta Doom of same and he stated it was okay if it will encourage pt to stay and not leave AMA due to his oxygen status. Pt currently on 5LPM per [place] @ 86%, remains hypertensive, tachycardic and tachypneic.

## 2022-02-02 NOTE — Consult Note (Signed)
NAME:  Richard [name], MRN:  [id number], DOB:  1971/10/01, LOS: 0 ADMISSION DATE:  02/02/2022, CONSULTATION DATE:  2/20 REFERRING MD:  Dr. Billy Fischer, CHIEF COMPLAINT:  Lung mass   History of Present Illness:  51 year old male with PMH as below, which is significant for COPD, seizure disorder, HTN, substance abuse, and TIA. Had a recent admission after a brief loss of consciousness and right sided weakness of non-specific etiology. TIA vs Todds paralysis. As part of the workup he had a CT of the chest, which demonstrated a large right sided lung mass. He was discharged with plans for pulmonary eval as an outpatient, but was lost to follow up. He was eventually able to follow up with Dr. [name] and was scheduled for EBUS on 2/20. When he arrived for the procedure he was found to be hypertensive and hypoxemic. Required 10L [place] to keep sats > 90%. He was taken to the ED for further workup and admission. PCCM asked to evaluate.   Pertinent  Medical History   has a past medical history of Asthma, Brain bleed ([place]), DVT (deep venous thrombosis) ([place]) (02/19/2015), GERD (gastroesophageal reflux disease), History of home oxygen therapy, Hypertension, Seizures ([place]), and Stroke ([place]).   Significant Hospital Events: Including procedures, antibiotic start and stop dates in addition to other pertinent events     Interim History / Subjective:    Objective   Blood pressure (!) 188/135, pulse (!) 118, temperature 98.8 F (37.1 C), temperature source Oral, resp. rate 15, height 5\' 9"  (1.753 m), weight 96.6 kg, SpO2 91 %.       No intake or output data in the 24 hours ending 02/02/22 1639 Filed Weights   02/02/22 1321  Weight: 96.6 kg    Examination: General: Middle aged male in NAD HENT: [place]/AT, PERRL, no JVD Lungs: Coarse wheeze on both inspiration and expiration. Dimnished R base.  Cardiovascular: Tachy, regular, no MRG Abdomen: Soft, non-tender, non-distended Extremities: No acute deformity or ROM  limitation. Bilateral lower extremity edema.  Neuro: Alert, oriented, non-focal  Resolved Hospital Problem list     Assessment & Plan:   Hypoxemia Lung mass Possible PE: lower extremity edema with worsening hypoxia - Supplemental O2 to keep sats > 90% - CTA chest. Suspicion for PE is high. - Will await CTA results prior to any planning. - If there is PE he will need anticoagulation prior to any further evaluation of his lung mass.  - If the symptomatic decline is due to the mass itself we will need to pursue urgent biopsy and treatment.   COPD without acute exacerbation - Anoro in place of home stiolto - PRN albuterol nebs  Hypertensive crisis - management per [facility]  Sz disorder on dilantin - per [facility]  Best Practice (right click and "Reselect all SmartList Selections" daily)   Diet/type: Regular consistency (see orders) DVT prophylaxis: other waiting to see if he needs full AC GI prophylaxis: N/A Lines: N/A Foley:  N/A Code Status:  full code Last date of multidisciplinary goals of care discussion [ ]   Labs   CBC: Recent Labs  Lab 02/02/22 1401 02/02/22 1405  WBC 9.6  --   NEUTROABS 7.7  --   HGB 15.5 17.0  HCT 48.9 50.0  MCV 92.1  --   PLT 335  --     Basic Metabolic Panel: Recent Labs  Lab 02/02/22 1405 02/02/22 1445  NA 133* 138  K 7.5* 4.2  CL  --  102  CO2  --  26  GLUCOSE  --  99  BUN  --  8  CREATININE  --  0.90  CALCIUM  --  9.2   GFR: Estimated Creatinine Clearance: 111.4 mL/min (by C-G formula based on SCr of 0.9 mg/dL). Recent Labs  Lab 02/02/22 1401  WBC 9.6    Liver Function Tests: Recent Labs  Lab 02/02/22 1445  AST 15  ALT 9  ALKPHOS 66  BILITOT 0.3  PROT 7.8  ALBUMIN 3.3*   No results for input(s): LIPASE, AMYLASE in the last 168 hours. No results for input(s): AMMONIA in the last 168 hours.  ABG    Component Value Date/Time   PHART 7.375 02/23/2015 0849   PCO2ART 53.9 (H) 02/23/2015 0849   PO2ART 102.0 (H)  02/23/2015 0849   HCO3 27.6 02/02/2022 1405   TCO2 29 02/02/2022 1405   ACIDBASEDEF 2.3 (H) 02/13/2015 1849   O2SAT 97 02/02/2022 1405     Coagulation Profile: No results for input(s): INR, PROTIME in the last 168 hours.  Cardiac Enzymes: No results for input(s): CKTOTAL, CKMB, CKMBINDEX, TROPONINI in the last 168 hours.  HbA1C: Hgb A1c MFr Bld  Date/Time Value Ref Range Status  10/13/2021 06:44 AM 6.1 (H) 4.8 - 5.6 % Final    Comment:    (NOTE) Pre diabetes:          5.7%-6.4%  Diabetes:              >6.4%  Glycemic control for   <7.0% adults with diabetes   12/24/2013 06:39 AM 6.1 (H) <5.7 % Final    Comment:    (NOTE)                                                                       According to the ADA Clinical Practice Recommendations for 2011, when HbA1c is used as a screening test:  >=6.5%   Diagnostic of Diabetes Mellitus           (if abnormal result is confirmed) 5.7-6.4%   Increased risk of developing Diabetes Mellitus References:Diagnosis and Classification of Diabetes Mellitus,Diabetes EUMP,5361,44(RXVQM 1):S62-S69 and Standards of Medical Care in         Diabetes - 2011,Diabetes Care,2011,34 (Suppl 1):S11-S61.    CBG: No results for input(s): GLUCAP in the last 168 hours.  Review of Systems:   Bolds are positive  Constitutional: weight loss, gain, night sweats, Fevers, chills, fatigue .  HEENT: headaches, Sore throat, sneezing, nasal congestion, post nasal drip, Difficulty swallowing, Tooth/dental problems, visual complaints visual changes, ear ache CV:  chest pain, radiates:,Orthopnea, PND, swelling in lower extremities R>L, dizziness, palpitations, syncope.  GI  heartburn, indigestion, abdominal pain, nausea, vomiting, diarrhea, change in bowel habits, loss of appetite, bloody stools.  Resp: cough, productive: , hemoptysis, dyspnea, chest pain, pleuritic.  Skin: rash or itching or icterus GU: dysuria, change in color of urine, urgency or  frequency. flank pain, hematuria  MS: joint pain or swelling. decreased range of motion  Psych: change in mood or affect. depression or anxiety.  Neuro: difficulty with speech, weakness, numbness, ataxia    Past Medical History:  He,  has a past medical history of Asthma, Brain bleed ([place]), DVT (deep venous thrombosis) ([place]) (02/19/2015), GERD (gastroesophageal reflux  disease), History of home oxygen therapy, Hypertension, Seizures ([place]), and Stroke ([place]).   Surgical History:   Past Surgical History:  Procedure Laterality Date   LEG SURGERY       Social History:   reports that he has been smoking cigarettes. He started smoking about 36 years ago. He has a 34.00 pack-year smoking history. He has never used smokeless tobacco. He reports current alcohol use. He reports that he does not currently use drugs after having used the following drugs: Cocaine.   Family History:  His family history includes Cancer in his father; Diabetes Mellitus II in his sister.   Allergies Allergies  Allergen Reactions   Dilaudid [Hydromorphone Hcl] Nausea Only and Other (See Comments)    Bradycardia and hyperthermia, too   Tape Itching and Dermatitis     Home Medications  Prior to Admission medications   Medication Sig Start Date End Date Taking? Authorizing Provider  cloNIDine (CATAPRES) 0.3 MG tablet Take 1 tablet (0.3 mg total) by mouth 2 (two) times daily. 03/04/20  Yes Patrecia Pour, MD  levETIRAcetam (KEPPRA) 500 MG tablet Take 500 mg by mouth 2 (two) times daily.   Yes [provider]  aspirin EC 325 MG EC tablet Take 1 tablet (325 mg total) by mouth daily. Swallow whole. 10/14/21 10/14/22  Pokhrel, Corrie Mckusick, MD  folic acid (FOLVITE) 1 MG tablet Take 1 tablet (1 mg total) by mouth daily. Patient not taking: Reported on 02/02/2022 10/15/21   Pokhrel, Corrie Mckusick, MD  lidocaine (LIDODERM) 5 % Place 1 patch onto the skin daily. Remove & Discard patch within 12 hours or as directed by MD 07/11/20    Petrucelli, Aldona Bar R, PA-C  losartan (COZAAR) 25 MG tablet Take 1 tablet (25 mg total) by mouth daily. 05/05/19   Carmin Muskrat, MD  methocarbamol (ROBAXIN) 500 MG tablet Take 1 tablet (500 mg total) by mouth every 8 (eight) hours as needed for muscle spasms. 07/11/20   Petrucelli, Samantha R, PA-C  Multiple Vitamin (MULTIVITAMIN WITH MINERALS) TABS tablet Take 1 tablet by mouth daily. 10/15/21   Pokhrel, Corrie Mckusick, MD  OXYGEN Inhale 2 L/min into the lungs as needed (for shortness of breath).    [provider]  thiamine 100 MG tablet Take 1 tablet (100 mg total) by mouth daily. 10/15/21   Pokhrel, Corrie Mckusick, MD  Tiotropium Bromide Monohydrate (SPIRIVA RESPIMAT) 2.5 MCG/ACT AERS Inhale 2 puffs into the lungs daily. Patient not taking: Reported on 01/28/2022 04/09/20   Margaretha Seeds, MD  Tiotropium Bromide Monohydrate (SPIRIVA RESPIMAT) 2.5 MCG/ACT AERS Inhale 2 puffs into the lungs daily. Patient not taking: Reported on 01/28/2022 04/09/20   Margaretha Seeds, MD  Tiotropium Bromide-Olodaterol (STIOLTO RESPIMAT) 2.5-2.5 MCG/ACT AERS Inhale 2 puffs into the lungs daily. 01/28/22   Collene Gobble, MD  vitamin B-12 (CYANOCOBALAMIN) 1000 MCG tablet Take 1 tablet (1,000 mcg total) by mouth daily. 03/04/20   Patrecia Pour, MD      Georgann Housekeeper, AGACNP-BC [facility]  See Amion for personal pager PCCM on call pager [phone] until 7pm. Please call Elink 7p-7a. [phone]  02/02/2022 5:07 PM

## 2022-02-02 NOTE — ED Provider Notes (Signed)
[name]-ICU/STEPDOWN  Provider Note  CSN: [id number] Arrival date & time: 02/02/22 1311  History Chief Complaint  Patient presents with   Hypertension    Richard [name] is a 51 y.o. male who presents today with shortness of breath.  Patient was scheduled for a bronchoscopy today.  However, he was found to be hypertensive and hypoxic.  Procedure was canceled and patient was sent to the emergency room.  Patient states he has been hypoxic and short of breath for months.  He supposed be wearing 2 to 3 L of oxygen at home.  He states that he has been progressively worsening.   Home Medications Prior to Admission medications   Medication Sig Start Date End Date Taking? Authorizing Provider  Aromatic Inhalants (INHALER DECONGESTANT IN) Inhale 1 spray into the lungs daily as needed (sob/cough).   Yes [provider]  cloNIDine (CATAPRES) 0.3 MG tablet Take 1 tablet (0.3 mg total) by mouth 2 (two) times daily. 03/04/20  Yes Patrecia Pour, MD  levETIRAcetam (KEPPRA) 500 MG tablet Take 500 mg by mouth 2 (two) times daily.   Yes [provider]  losartan (COZAAR) 25 MG tablet Take 1 tablet (25 mg total) by mouth daily. 05/05/19  Yes Carmin Muskrat, MD  aspirin EC 325 MG EC tablet Take 1 tablet (325 mg total) by mouth daily. Swallow whole. 10/14/21 10/14/22  Pokhrel, Corrie Mckusick, MD  folic acid (FOLVITE) 1 MG tablet Take 1 tablet (1 mg total) by mouth daily. Patient not taking: Reported on 02/02/2022 10/15/21   Pokhrel, Corrie Mckusick, MD  lidocaine (LIDODERM) 5 % Place 1 patch onto the skin daily. Remove & Discard patch within 12 hours or as directed by MD Patient not taking: Reported on 02/02/2022 07/11/20   Petrucelli, Glynda Jaeger, PA-C  methocarbamol (ROBAXIN) 500 MG tablet Take 1 tablet (500 mg total) by mouth every 8 (eight) hours as needed for muscle spasms. Patient not taking: Reported on 02/02/2022 07/11/20   Petrucelli, Aldona Bar R, PA-C  Multiple Vitamin (MULTIVITAMIN  WITH MINERALS) TABS tablet Take 1 tablet by mouth daily. Patient not taking: Reported on 02/02/2022 10/15/21   Flora Lipps, MD  OXYGEN Inhale 2 L/min into the lungs as needed (for shortness of breath).    [provider]  Phenytoin (DILANTIN PO) Take 1 capsule by mouth in the morning and at bedtime.    [provider]  thiamine 100 MG tablet Take 1 tablet (100 mg total) by mouth daily. Patient not taking: Reported on 02/02/2022 10/15/21   Flora Lipps, MD  Tiotropium Bromide Monohydrate (SPIRIVA RESPIMAT) 2.5 MCG/ACT AERS Inhale 2 puffs into the lungs daily. Patient not taking: Reported on 01/28/2022 04/09/20   Margaretha Seeds, MD  Tiotropium Bromide Monohydrate (SPIRIVA RESPIMAT) 2.5 MCG/ACT AERS Inhale 2 puffs into the lungs daily. Patient not taking: Reported on 01/28/2022 04/09/20   Margaretha Seeds, MD  Tiotropium Bromide-Olodaterol (STIOLTO RESPIMAT) 2.5-2.5 MCG/ACT AERS Inhale 2 puffs into the lungs daily. 01/28/22   Collene Gobble, MD  vitamin B-12 (CYANOCOBALAMIN) 1000 MCG tablet Take 1 tablet (1,000 mcg total) by mouth daily. Patient not taking: Reported on 02/02/2022 03/04/20   Patrecia Pour, MD     Allergies    Dilaudid [hydromorphone hcl] and Tape   Review of Systems   Review of Systems  Constitutional:  Positive for activity change and appetite change. Negative for chills and fever.  HENT:  Negative for ear pain and sore throat.   Eyes:  Negative for  pain and visual disturbance.  Respiratory:  Positive for cough, chest tightness, shortness of breath and wheezing.   Cardiovascular:  Negative for chest pain and palpitations.  Gastrointestinal:  Negative for abdominal pain and vomiting.  Genitourinary:  Negative for dysuria and hematuria.  Musculoskeletal:  Negative for arthralgias and back pain.  Skin:  Negative for color change and rash.  Neurological:  Negative for seizures and syncope.  All other systems reviewed and are negative. Please see HPI for  pertinent positives and negatives  Physical Exam BP (!) 149/104    Pulse 77    Temp 97.9 F (36.6 C) (Oral)    Resp 19    Ht 5\' 9"  (1.753 m)    Wt 91.4 kg    SpO2 96%    BMI 29.76 kg/m   Physical Exam Vitals and nursing note reviewed.  Constitutional:      General: He is in acute distress.     Appearance: He is well-developed. He is ill-appearing and toxic-appearing.  HENT:     Head: Normocephalic and atraumatic.  Eyes:     Conjunctiva/sclera: Conjunctivae normal.  Cardiovascular:     Rate and Rhythm: Regular rhythm. Tachycardia present.     Heart sounds: No murmur heard. Pulmonary:     Effort: Respiratory distress present.     Breath sounds: Wheezing and rhonchi present.  Abdominal:     Palpations: Abdomen is soft.     Tenderness: There is no abdominal tenderness.  Musculoskeletal:        General: No swelling.     Cervical back: Neck supple.     Right lower leg: Edema present.     Left lower leg: Edema present.  Skin:    General: Skin is warm and dry.     Capillary Refill: Capillary refill takes less than 2 seconds.  Neurological:     Mental Status: He is alert.  Psychiatric:        Mood and Affect: Mood normal.    ED Results / Procedures / Treatments   EKG EKG Interpretation  Date/Time:  Monday February 02 2022 13:20:19 EST Ventricular Rate:  100 PR Interval:  207 QRS Duration: 87 QT Interval:  355 QTC Calculation: 458 R Axis:   79 Text Interpretation:  Sinus tachycardia Prolonged PR interval Probable left atrial enlargement Left ventricular hypertrophy Anterior infarct, old ST elevation, consider inferior injury Baseline wander in lead(s) I III aVL Confirmed by Pattricia Boss [phone]) on 02/02/2022 1:53:59 PM  Procedures Procedures  Medications Ordered in the ED Medications  nicotine (NICODERM CQ - dosed in mg/24 hours) patch 14 mg (14 mg Transdermal Patch Applied 02/04/22 1049)  nicotine polacrilex (NICORETTE) gum 2 mg (2 mg Oral Given 02/02/22 1452)   umeclidinium-vilanterol (ANORO ELLIPTA) 62.5-25 MCG/ACT 1 puff (1 puff Inhalation Given 02/04/22 0809)  albuterol (PROVENTIL) (2.5 MG/3ML) 0.083% nebulizer solution 2.5 mg (2.5 mg Nebulization Given 02/04/22 0125)  cefTRIAXone (ROCEPHIN) 2 g in sodium chloride 0.9 % 100 mL IVPB (0 g Intravenous Stopped 02/04/22 2055)  Chlorhexidine Gluconate Cloth 2 % PADS 6 each (0 each Topical Duplicate 1/76/16 0737)  acetaminophen (TYLENOL) tablet 650 mg (has no administration in time range)    Or  acetaminophen (TYLENOL) suppository 650 mg (has no administration in time range)  levETIRAcetam (KEPPRA) IVPB 500 mg/100 mL premix (0 mg Intravenous Stopped 02/04/22 2206)  hydrALAZINE (APRESOLINE) injection 10 mg (10 mg Intravenous Given 02/03/22 0541)  guaiFENesin-dextromethorphan (ROBITUSSIN DM) 100-10 MG/5ML syrup 10 mL (10 mLs Oral Given 02/05/22 0204)  cloNIDine (CATAPRES) tablet 0.3 mg (0.3 mg Oral Given 02/04/22 2147)  losartan (COZAAR) tablet 25 mg (25 mg Oral Given 02/04/22 1042)  feeding supplement (ENSURE ENLIVE / ENSURE PLUS) liquid 237 mL (237 mLs Oral Given 02/04/22 2015)  multivitamin with minerals tablet 1 tablet (1 tablet Oral Patient Refused/Not Given 02/04/22 1049)  0.9 %  sodium chloride infusion ( Intravenous Infusion Verify 02/05/22 0600)  MEDLINE mouth rinse (15 mLs Mouth Rinse Given 02/04/22 2152)  ketorolac (TORADOL) 15 MG/ML injection 15 mg (15 mg Intravenous Given 02/02/22 1424)  hydrALAZINE (APRESOLINE) injection 10 mg (10 mg Intravenous Given 02/02/22 1543)  iohexol (OMNIPAQUE) 350 MG/ML injection 80 mL (80 mLs Intravenous Contrast Given 02/02/22 1823)  azithromycin (ZITHROMAX) 500 mg in sodium chloride 0.9 % 250 mL IVPB (0 mg Intravenous Stopped 02/02/22 2355)  ketorolac (TORADOL) 15 MG/ML injection 15 mg (15 mg Intravenous Given 02/03/22 0513)  gadobutrol (GADAVIST) 1 MMOL/ML injection 9 mL (9 mLs Intravenous Contrast Given 02/03/22 2250)  fludeoxyglucose F - 18 (FDG) injection 10 millicurie (01.0  millicuries Intravenous Given 02/04/22 1142)  magnesium sulfate IVPB 2 g 50 mL (0 g Intravenous Paused 02/04/22 1946)     ED Course       MDM   This patient presents to the ED for concern of sob and hypertension, this involves an extensive number of treatment options, and is a complaint that carries with it a high risk of complications and morbidity.  The differential diagnosis includes pneumonia, worsening malignancy, copd exacerbation, PE. Patients presentation is complicated by their history of presumed lung cancer  Additional history obtained: Additional history obtained from family Records reviewed previous admission documents, Care Everywhere/External Records, and Primary Care Documents  Lab Tests: I Ordered, and personally interpreted labs.  The pertinent results include:  respiratory alkalosis. Normal troponin  Imaging Studies ordered: I ordered imaging studies including CT scan of chest and X-ray of chest   I independently visualized and interpreted imaging which showed known mass on CXR with questionable pneumonia. Ct penidng I agree with the radiologist interpretation  EKG (personally reviewed and interpreted): mild ST elevation in inferior leads not seen previously.    Medical Decision Making: Patient presented as a transfer from bronchoscopy suite.  Patient was hypotensive and hypoxic.  They were unable to proceed with bronc.  On arrival he was continued to be hypoxic.  On his home oxygen he was saturating anywhere from 75 to 85%.  He was titrated up to 6 L with ongoing saturations in the low to high 80s.  He was started on high flow nasal cannula at 10L to maintain saturations greater than 90%.  He did have EKG changes but no associated chest pain, likely secondary to ongoing hypoxia, tachycardia, and hypertension.  He does have a swollen right leg.  CT scan ordered to evaluate for possible PE.  This was pending.  He also remained hypertensive, though he did improve after IV  antihypertensives here in the ED.  Patient will require admission to the hospital.  Critical care was consulted given that this is the patient.  They will come to the emergency room to see the patient.  Patient was signed out to Dr. Jerrilyn Cairo.  Plan to follow-up on critical care recommendations, CT scan.  Complexity of problems addressed: Patients presentation is most consistent with  acute presentation with potential threat to life or bodily function  Disposition: After consideration of the diagnostic results and the patients response to treatment,  I feel that the patent would  benefit from admission   .   Patient seen in conjunction with my attending, Dr. Jeanell Sparrow.    Final Clinical Impression(s) / ED Diagnoses Final diagnoses:  Right lower lobe lung mass    Rx / DC Orders ED Discharge Orders     None         Jacelyn Pi, MD 02/05/22 4320    Pattricia Boss, MD 02/05/22 1341

## 2022-02-02 NOTE — Progress Notes (Signed)
CTA reviewed Add CTX NPO after midnight.  Will discuss with him tomorrow AM that we need to proceed with either (A) high risk bronch and emergent radiation or (B) hospice care.  Erskine Emery MD PCCM

## 2022-02-02 NOTE — Progress Notes (Signed)
Patient was instructed to arrive at 0645 by PAT nurse but procedure is not scheduled until 1500.  Gave patient the option to remain with [place] in pre-op or go home until 1200.  Patient stated that he would like to go home and will return at 1200.  Patient verbalized understanding.

## 2022-02-02 NOTE — ED Provider Notes (Signed)
Transfer of Care Note I assumed care of Richard [name] on 02/02/2022 at 3:04 PM  Briefly, Richard [name] is a 51 y.o. male who: PMHx: Asthma/COPD, HTN, known lung mass P/w HTN and hypoxia, scheduled for bronch today for lung biopsy, in pre-op found to be satting in the 80's on 3-4L and HTN to 220/126, sent to ED for eval and likely admission CXR: Redemonstrated large right lung malignancy, no appreciable airspace consolidation elsewhere. EKG: Sinus tachycardia, PR 207, QTc 458, ST elevation in inferior leads  Labs: COVID-negative, no leukocytosis, no anemia, VBG w/ pH 7.483, PCO2 36.8  Plan at the time of handoff: Pending CMP, BNP, troponin Initiating optiflow for hypoxia and tachypnea   Please refer to the original providers note for additional information regarding the care of Richard [name].  Reassessment: I personally reassessed the patient:  Vital Signs:  The most current vitals were Blood pressure (!) 195/139, pulse (!) 108, temperature 98.8 F (37.1 C), temperature source Oral, resp. rate 15, height 5\' 9"  (1.753 m), weight 96.6 kg, SpO2 (!) 86 %.   Hemodynamics:  The patient is tachycardic, tachypnic, hypoxic - placing on Optiflow Mental Status:  The patient is alert  Additional MDM: Admit for O2 requirement in the setting of R lung mass,   Patient seen in conjunction with Dr. Billy Fischer  Electronically signed by:  Wynetta Fines, MD on 02/02/2022 at  3:04 PM  Clinical Impression:  1. Right lower lobe lung mass     Dispo: Imagene Riches, MD 02/03/22 1421    Gareth Morgan, MD 02/05/22 2215

## 2022-02-02 NOTE — Progress Notes (Signed)
Upon arrival to Short Stay patient stated" I thought I was only here for a COVID test.". Patient denies being aware that he is scheduled for a bronchoscopy and is undecided on if he wants to have procedure done today. Patient also c/o headache. BP 196/125 while seated in wheelchair. Patient agreed to change into hospital gown and get into the bed. Patient's wife agreed that she will be able to drive patient home after procedure. Patient's wife aware procedure may be delayed/canceled due to patient's elevated BP. Patient's wife escorted back to the lobby and she stated she had an errand to run and would return shortly.  Vital signs taken again once patient in bed. BP 220/146 and heart rate 103. O2 sat 83 % on 3 liters of oxygen. Dr. Fransisco Beau with anesthesia made aware and order received to take patient to the ED. Dr. Fransisco Beau also came over to Pre-Op to assess patient. Dr. [name] talked to patient prior to transport to ED. Patient taken to the ED via stretcher with O2 at 4 LPM and on a monitor. Report given to Sheldon Silvan, RN.   Attempted to call patient's wife to make her aware patient was taken to the ED. Voicemail is full. Called the volunteer desk to make them aware patient is in the ED bay 22 and to notify wife when she returns.

## 2022-02-02 NOTE — H&P (Addendum)
History and Physical    Stanislav Gervase IWP:[id number] DOB: [birth year] DOA: 02/02/2022  PCP: Patient, No Pcp Per (Inactive)  Patient coming from: Home.  Chief Complaint: Hypoxia.  HPI: Rajat Staver is a 51 y.o. male with history of hypertension, seizure disorder, COPD, TIA was diagnosed with a right lung mass was scheduled for EBUS and patient was found to be hypoxic and hypotensive and was referred to the ER.  Patient states he has been short of breath for the last few months since the diagnosis of his lung mass.  Denies any chest pain.  Denies any productive cough.  ED Course: In the ER patient is hypoxic requiring 8 L of oxygen with diffuse wheezing on exam.  CT angiogram of the chest was negative for pulmonary embolism.  Pulmonary critical care was consulted.  Patient will be transferred to [facility] for bronchoscopy and radiation oncology consult.  Shows sinus tachycardia.  High sensitive troponin and BNP were negative.  COVID test is pending.  Review of Systems: As per HPI, rest all negative.   Past Medical History:  Diagnosis Date   Asthma    Brain bleed ([place])    DVT (deep venous thrombosis) ([place]) 02/19/2015   RLE   GERD (gastroesophageal reflux disease)    History of home oxygen therapy    Hypertension    Seizures ([place])    last episode 03/2013   Stroke [facility])     Past Surgical History:  Procedure Laterality Date   LEG SURGERY       reports that he has been smoking cigarettes. He started smoking about 36 years ago. He has a 34.00 pack-year smoking history. He has never used smokeless tobacco. He reports current alcohol use. He reports that he does not currently use drugs after having used the following drugs: Cocaine.  Allergies  Allergen Reactions   Dilaudid [Hydromorphone Hcl] Nausea Only and Other (See Comments)    Bradycardia and hyperthermia, too   Tape Itching and Dermatitis    Family History  Problem Relation Age of Onset   Cancer Father    Diabetes  Mellitus II Sister     Prior to Admission medications   Medication Sig Start Date End Date Taking? Authorizing Provider  Aromatic Inhalants (INHALER DECONGESTANT IN) Inhale 1 spray into the lungs daily as needed (sob/cough).   Yes [provider]  cloNIDine (CATAPRES) 0.3 MG tablet Take 1 tablet (0.3 mg total) by mouth 2 (two) times daily. 03/04/20  Yes Patrecia Pour, MD  levETIRAcetam (KEPPRA) 500 MG tablet Take 500 mg by mouth 2 (two) times daily.   Yes [provider]  losartan (COZAAR) 25 MG tablet Take 1 tablet (25 mg total) by mouth daily. 05/05/19  Yes Carmin Muskrat, MD  aspirin EC 325 MG EC tablet Take 1 tablet (325 mg total) by mouth daily. Swallow whole. 10/14/21 10/14/22  Pokhrel, Corrie Mckusick, MD  folic acid (FOLVITE) 1 MG tablet Take 1 tablet (1 mg total) by mouth daily. Patient not taking: Reported on 02/02/2022 10/15/21   Pokhrel, Corrie Mckusick, MD  lidocaine (LIDODERM) 5 % Place 1 patch onto the skin daily. Remove & Discard patch within 12 hours or as directed by MD Patient not taking: Reported on 02/02/2022 07/11/20   Petrucelli, Glynda Jaeger, PA-C  methocarbamol (ROBAXIN) 500 MG tablet Take 1 tablet (500 mg total) by mouth every 8 (eight) hours as needed for muscle spasms. Patient not taking: Reported on 02/02/2022 07/11/20   Petrucelli, Glynda Jaeger, PA-C  Multiple Vitamin (  MULTIVITAMIN WITH MINERALS) TABS tablet Take 1 tablet by mouth daily. Patient not taking: Reported on 02/02/2022 10/15/21   Flora Lipps, MD  OXYGEN Inhale 2 L/min into the lungs as needed (for shortness of breath).    [provider]  Phenytoin (DILANTIN PO) Take 1 capsule by mouth in the morning and at bedtime.    [provider]  thiamine 100 MG tablet Take 1 tablet (100 mg total) by mouth daily. Patient not taking: Reported on 02/02/2022 10/15/21   Flora Lipps, MD  Tiotropium Bromide Monohydrate (SPIRIVA RESPIMAT) 2.5 MCG/ACT AERS Inhale 2 puffs into the lungs daily. Patient not taking:  Reported on 01/28/2022 04/09/20   Margaretha Seeds, MD  Tiotropium Bromide Monohydrate (SPIRIVA RESPIMAT) 2.5 MCG/ACT AERS Inhale 2 puffs into the lungs daily. Patient not taking: Reported on 01/28/2022 04/09/20   Margaretha Seeds, MD  Tiotropium Bromide-Olodaterol (STIOLTO RESPIMAT) 2.5-2.5 MCG/ACT AERS Inhale 2 puffs into the lungs daily. 01/28/22   Collene Gobble, MD  vitamin B-12 (CYANOCOBALAMIN) 1000 MCG tablet Take 1 tablet (1,000 mcg total) by mouth daily. Patient not taking: Reported on 02/02/2022 03/04/20   Patrecia Pour, MD    Physical Exam: Constitutional: Moderately built and nourished. Vitals:   02/02/22 1800 02/02/22 1845 02/02/22 1930 02/02/22 2030  BP: (!) 181/122 (!) 166/114 (!) 160/120 (!) 167/102  Pulse: (!) 108 (!) 110 (!) 112 (!) 107  Resp: 14 17 18  (!) 23  Temp:      TempSrc:      SpO2: 91% 93% 93% 94%  Weight:      Height:       Eyes: Anicteric no pallor. ENMT: No discharge from the ears eyes nose and mouth. Neck: No mass felt.  No neck rigidity. Respiratory: Bilateral expiratory wheeze and no crepitations. Cardiovascular: S1-S2 heard. Abdomen: Soft nontender bowel sound present. Musculoskeletal: Right lower extremity edema. Skin: No rash. Neurologic: Alert awake oriented time place and person.  Moves all extremities. Psychiatric: Appears normal.  Normal affect.   Labs on Admission: I have personally reviewed following labs and imaging studies  CBC: Recent Labs  Lab 02/02/22 1401 02/02/22 1405  WBC 9.6  --   NEUTROABS 7.7  --   HGB 15.5 17.0  HCT 48.9 50.0  MCV 92.1  --   PLT 335  --    Basic Metabolic Panel: Recent Labs  Lab 02/02/22 1405 02/02/22 1445  NA 133* 138  K 7.5* 4.2  CL  --  102  CO2  --  26  GLUCOSE  --  99  BUN  --  8  CREATININE  --  0.90  CALCIUM  --  9.2   GFR: Estimated Creatinine Clearance: 111.4 mL/min (by C-G formula based on SCr of 0.9 mg/dL). Liver Function Tests: Recent Labs  Lab 02/02/22 1445  AST 15  ALT  9  ALKPHOS 66  BILITOT 0.3  PROT 7.8  ALBUMIN 3.3*   No results for input(s): LIPASE, AMYLASE in the last 168 hours. No results for input(s): AMMONIA in the last 168 hours. Coagulation Profile: No results for input(s): INR, PROTIME in the last 168 hours. Cardiac Enzymes: No results for input(s): CKTOTAL, CKMB, CKMBINDEX, TROPONINI in the last 168 hours. BNP (last 3 results) No results for input(s): PROBNP in the last 8760 hours. HbA1C: No results for input(s): HGBA1C in the last 72 hours. CBG: No results for input(s): GLUCAP in the last 168 hours. Lipid Profile: No results for input(s): CHOL, HDL, LDLCALC, TRIG, CHOLHDL,  LDLDIRECT in the last 72 hours. Thyroid Function Tests: No results for input(s): TSH, T4TOTAL, FREET4, T3FREE, THYROIDAB in the last 72 hours. Anemia Panel: No results for input(s): VITAMINB12, FOLATE, FERRITIN, TIBC, IRON, RETICCTPCT in the last 72 hours. Urine analysis:    Component Value Date/Time   COLORURINE YELLOW 10/12/2021 1835   APPEARANCEUR CLOUDY (A) 10/12/2021 1835   LABSPEC 1.033 (H) 10/12/2021 1835   PHURINE 5.0 10/12/2021 [place] 10/12/2021 [place] 10/12/2021 1835   HGBUR negative 01/16/2011 1530   BILIRUBINUR NEGATIVE 10/12/2021 [place] 10/12/2021 [place] NEGATIVE 10/12/2021 1835   UROBILINOGEN 1.0 07/15/2015 2137   NITRITE NEGATIVE 10/12/2021 1835   LEUKOCYTESUR NEGATIVE 10/12/2021 1835   Sepsis Labs: @LABRCNTIP (procalcitonin:4,lacticidven:4) ) Recent Results (from the past 240 hour(s))  Resp Panel by RT-PCR (Flu A&B, Covid) Nasopharyngeal Swab     Status: None   Collection Time: 01/24/22  8:45 PM   Specimen: Nasopharyngeal Swab; Nasopharyngeal(NP) swabs in vial transport medium  Result Value Ref Range Status   SARS Coronavirus 2 by RT PCR NEGATIVE NEGATIVE Final    Comment: (NOTE) SARS-CoV-2 target nucleic acids are NOT DETECTED.  The SARS-CoV-2 RNA is generally detectable  in upper respiratory specimens during the acute phase of infection. The lowest concentration of SARS-CoV-2 viral copies this assay can detect is 138 copies/mL. A negative result does not preclude SARS-Cov-2 infection and should not be used as the sole basis for treatment or other patient management decisions. A negative result may occur with  improper specimen collection/handling, submission of specimen other than nasopharyngeal swab, presence of viral mutation(s) within the areas targeted by this assay, and inadequate number of viral copies(<138 copies/mL). A negative result must be combined with clinical observations, patient history, and epidemiological information. The expected result is Negative.  Fact Sheet for Patients:  EntrepreneurPulse.com.au  Fact Sheet for Healthcare Providers:  IncredibleEmployment.be  This test is no t yet approved or cleared by the [place] FDA and  has been authorized for detection and/or diagnosis of SARS-CoV-2 by FDA under an Emergency Use Authorization (EUA). This EUA will remain  in effect (meaning this test can be used) for the duration of the COVID-19 declaration under Section 564(b)(1) of the Act, 21 U.S.C.section 360bbb-3(b)(1), unless the authorization is terminated  or revoked sooner.       Influenza A by PCR NEGATIVE NEGATIVE Final   Influenza B by PCR NEGATIVE NEGATIVE Final    Comment: (NOTE) The Xpert Xpress SARS-CoV-2/FLU/RSV plus assay is intended as an aid in the diagnosis of influenza from Nasopharyngeal swab specimens and should not be used as a sole basis for treatment. Nasal washings and aspirates are unacceptable for Xpert Xpress SARS-CoV-2/FLU/RSV testing.  Fact Sheet for Patients: EntrepreneurPulse.com.au  Fact Sheet for Healthcare Providers: IncredibleEmployment.be  This test is not yet approved or cleared by the [place] FDA and has  been authorized for detection and/or diagnosis of SARS-CoV-2 by FDA under an Emergency Use Authorization (EUA). This EUA will remain in effect (meaning this test can be used) for the duration of the COVID-19 declaration under Section 564(b)(1) of the Act, 21 U.S.C. section 360bbb-3(b)(1), unless the authorization is terminated or revoked.  Performed at [facility], [place] [address], [place], [place] [address]   SARS Coronavirus 2 by RT PCR (hospital order, performed in [facility] hospital lab) Nasopharyngeal Nasopharyngeal Swab     Status: None   Collection Time: 02/02/22 11:48 AM  Specimen: Nasopharyngeal Swab  Result Value Ref Range Status   SARS Coronavirus 2 NEGATIVE NEGATIVE Final    Comment: (NOTE) SARS-CoV-2 target nucleic acids are NOT DETECTED.  The SARS-CoV-2 RNA is generally detectable in upper and lower respiratory specimens during the acute phase of infection. The lowest concentration of SARS-CoV-2 viral copies this assay can detect is 250 copies / mL. A negative result does not preclude SARS-CoV-2 infection and should not be used as the sole basis for treatment or other patient management decisions.  A negative result may occur with improper specimen collection / handling, submission of specimen other than nasopharyngeal swab, presence of viral mutation(s) within the areas targeted by this assay, and inadequate number of viral copies (<250 copies / mL). A negative result must be combined with clinical observations, patient history, and epidemiological information.  Fact Sheet for Patients:   StrictlyIdeas.no  Fact Sheet for Healthcare Providers: BankingDealers.co.za  This test is not yet approved or  cleared by the [place] FDA and has been authorized for detection and/or diagnosis of SARS-CoV-2 by FDA under an Emergency Use Authorization (EUA).  This EUA will remain in effect (meaning this test can be  used) for the duration of the COVID-19 declaration under Section 564(b)(1) of the Act, 21 U.S.C. section 360bbb-3(b)(1), unless the authorization is terminated or revoked sooner.  Performed at [facility], [place] [address], [place], [place] [address]      Radiological Exams on Admission: CT Angio Chest PE W and/or Wo Contrast  Result Date: 02/02/2022 CLINICAL DATA:  Shortness of breath EXAM: CT ANGIOGRAPHY CHEST WITH CONTRAST TECHNIQUE: Multidetector CT imaging of the chest was performed using the standard protocol during bolus administration of intravenous contrast. Multiplanar CT image reconstructions and MIPs were obtained to evaluate the vascular anatomy. RADIATION DOSE REDUCTION: This exam was performed according to the departmental dose-optimization program which includes automated exposure control, adjustment of the mA and/or kV according to patient size and/or use of iterative reconstruction technique. CONTRAST:  70mL OMNIPAQUE IOHEXOL 350 MG/ML SOLN COMPARISON:  Previous studies including the CT done on 02-05-2022 FINDINGS: Cardiovascular: Heart is enlarged in size. There is ectasia of ascending thoracic aorta measuring 4.4 cm. There is homogeneous enhancement in the thoracic aorta. There is ectasia of main pulmonary artery measuring 3.7 cm suggesting pulmonary arterial hypertension. There are no intraluminal filling defects in the pulmonary artery branches. Contrast density in the some of the small peripheral pulmonary artery branches is less than optimal. There is extrinsic compression of segmental pulmonary artery branches in the right lower lung fields. Mediastinum/Nodes: There are enlarged lymph nodes in mediastinum. There is a large soft tissue mass in the right lower lobe extending into the mediastinum in the subcarinal region. Lungs/Pleura: There is a large lesion measures proximally 10.2 x 7 cm in the right lower lobe. There is satellite nodule lateral to the large mass measuring  2.7 cm in diameter in the right lower lobe. There are new small patchy infiltrates in the posteroinferior aspect of right lower lobe suggesting atelectasis/pneumonia. There is fluid/mucous filling some of the bronchi in right lower lobe. There is no significant pleural effusion or pneumothorax. Upper Abdomen: There is hyperplasia of left adrenal with possible 2.9 x 1.7 cm nodule. There is 13 mm nodule in the right adrenal. Adrenals are not included in their entirety limiting evaluation. Musculoskeletal: Unremarkable. Review of the MIP images confirms the above findings. IMPRESSION: There is 10.2 cm mass in the posteromedial right mid and right lower  lung fields suggesting primary malignant neoplasm with extension to the subcarinal region of mediastinum. There is satellite nodule lateral to the large mass in the right lung suggesting pulmonary metastatic disease. There are new patchy infiltrates in the posteroinferior right lower lobe suggesting atelectasis/pneumonia. There is no evidence of pulmonary artery embolism. There is ectasia of main pulmonary artery suggesting pulmonary arterial hypertension. There is extrinsic compression of segmental and subsegmental branches in the right lower lobe caused by malignant neoplasm. There is no evidence of thoracic aortic dissection. There is ectasia of ascending thoracic aorta. There is hyperplasia with possible nodules in both adrenals. Findings suggest possible incidental adenomas or metastatic disease. Electronically Signed   By: Elmer Picker M.D.   On: 02/02/2022 18:59   DG Chest Portable 1 View  Result Date: 02/02/2022 CLINICAL DATA:  Provided history: Shortness of breath. EXAM: PORTABLE CHEST 1 VIEW COMPARISON:  Prior chest radiographs 01/24/2022 and earlier. Prior chest CT 01/24/2022. FINDINGS: Heart size within normal limits. Redemonstrated large right central/perihilar lung malignancy with and adjacent right upper lobe satellite mass. Mediastinal  involvement was better appreciated on the recent prior chest CT of 01/24/2022. No appreciable airspace consolidation elsewhere. No evidence of pleural effusion or pneumothorax. No acute bony abnormality identified. IMPRESSION: Redemonstrated large right central/perihilar lung malignancy and adjacent right upper lobe satellite mass. Mediastinal involvement was better appreciated on the recent prior chest CT of 01/24/2022. No appreciable airspace consolidation elsewhere. Electronically Signed   By: Kellie Simmering D.O.   On: 02/02/2022 14:47    EKG: Independently reviewed.  Sinus tachycardia.  Assessment/Plan Principal Problem:   Acute respiratory failure with hypoxemia (HCC) Active Problems:   Hypertensive urgency   Right lower lobe lung mass   COPD (chronic obstructive pulmonary disease) (HCC)    Acute respiratory failure with hypoxia likely related to the lung mass and also COPD.  Pulmonary critical care has been consulted.  Plan is to get bronchoscopy and also radiation oncology consult. COPD exacerbation on inhalers as advised by pulmonary critical care.  Patient is also on empiric antibiotics. Hypertensive urgency presently on as needed IV hydralazine.  If blood pressure does not improve will need to start antihypertensive infusions. History of seizure disorder since patient will be n.p.o. for bronchoscopy we will keep patient Keppra as IV. Right lower extremity edema we will check Dopplers to rule out DVT. History of TIA. History of polysubstance abuse.   Since patient has a lung mass with acute hypoxic respiratory failure will need close monitoring and inpatient status.  COVID test is pending.   DVT prophylaxis: SCDs.  Avoiding anticoagulation in anticipation of possible bronchoscopy. Code Status: Full code. Family Communication: Patient's wife at the bedside. Disposition Plan: To be determined. Consults called: Pulmonary critical care. Admission status: Inpatient.   Rise Patience MD [facility] Pager [phone].  If 7PM-7AM, please contact night-coverage [email or web address] Password [facility]  02/02/2022, 10:23 PM

## 2022-02-02 NOTE — ED Notes (Signed)
RT placed pt on hi flo O2. Pt had hydralazine IV at this time, monitoring BP closely. Pt sitting up in stretcher, wife at bedside, call light in reach.

## 2022-02-02 NOTE — ED Triage Notes (Signed)
Pt arrives from short stay for hypertension and hypoxia, was supposed to have a bronchoscopy today with Dr [name]. SpO2 83-90% on 3L-4L. BP 196/125 on arrival. 1230 BP was 220/126. Most recent was 230/145. Took clonidine at 0100. [place] MD aware, may still do procedure if improvement in sx occurs. Pt endorses HA. 18g R hand with LR infusing.

## 2022-02-03 ENCOUNTER — Inpatient Hospital Stay (HOSPITAL_COMMUNITY): Payer: Medicare HMO

## 2022-02-03 ENCOUNTER — Encounter (HOSPITAL_COMMUNITY)
Admission: RE | Disposition: A | Discharge status: Home / Self Care | Payer: Self-pay | Source: Ambulatory Visit | Attending: Internal Medicine

## 2022-02-03 ENCOUNTER — Ambulatory Visit
Admit: 2022-02-03 | Discharge: 2022-02-03 | Disposition: A | Discharge status: Home / Self Care | Payer: Medicare HMO | Attending: Radiation Oncology | Admitting: Radiation Oncology

## 2022-02-03 ENCOUNTER — Encounter (HOSPITAL_COMMUNITY): Payer: Self-pay | Admitting: Internal Medicine

## 2022-02-03 ENCOUNTER — Encounter: Payer: Self-pay | Admitting: Radiation Oncology

## 2022-02-03 DIAGNOSIS — F17210 Nicotine dependence, cigarettes, uncomplicated: Secondary | ICD-10-CM | POA: Diagnosis not present

## 2022-02-03 DIAGNOSIS — Z7189 Other specified counseling: Secondary | ICD-10-CM | POA: Diagnosis not present

## 2022-02-03 DIAGNOSIS — C3481 Malignant neoplasm of overlapping sites of right bronchus and lung: Secondary | ICD-10-CM | POA: Insufficient documentation

## 2022-02-03 DIAGNOSIS — J9601 Acute respiratory failure with hypoxia: Secondary | ICD-10-CM

## 2022-02-03 DIAGNOSIS — R609 Edema, unspecified: Secondary | ICD-10-CM | POA: Diagnosis not present

## 2022-02-03 DIAGNOSIS — R918 Other nonspecific abnormal finding of lung field: Secondary | ICD-10-CM | POA: Diagnosis not present

## 2022-02-03 LAB — RESP PANEL BY RT-PCR (FLU A&B, COVID) ARPGX2
Influenza A by PCR: NEGATIVE
Influenza B by PCR: NEGATIVE
SARS Coronavirus 2 by RT PCR: NEGATIVE

## 2022-02-03 LAB — PHENYTOIN LEVEL, TOTAL: Phenytoin Lvl: <2.5 ug/mL — ABNORMAL LOW (ref 10.0–20.0)

## 2022-02-03 LAB — GLUCOSE, CAPILLARY
Glucose-Capillary: 113 mg/dL — ABNORMAL HIGH (ref 70–99)
Glucose-Capillary: 106 mg/dL — ABNORMAL HIGH (ref 70–99)
Glucose-Capillary: 135 mg/dL — ABNORMAL HIGH (ref 70–99)

## 2022-02-03 LAB — CBC
HCT: 47.1 % (ref 39.0–52.0)
Hemoglobin: 15.3 g/dL (ref 13.0–17.0)
MCH: 30 pg (ref 26.0–34.0)
MCHC: 32.5 g/dL (ref 30.0–36.0)
MCV: 92.4 fL (ref 80.0–100.0)
Platelets: 299 10*3/uL (ref 150–400)
RBC: 5.1 MIL/uL (ref 4.22–5.81)
RDW: 15.8 % — ABNORMAL HIGH (ref 11.5–15.5)
WBC: 11.2 10*3/uL — ABNORMAL HIGH (ref 4.0–10.5)
nRBC: 0 % (ref 0.0–0.2)

## 2022-02-03 LAB — MAGNESIUM: Magnesium: 1.7 mg/dL (ref 1.7–2.4)

## 2022-02-03 LAB — COMPREHENSIVE METABOLIC PANEL
ALT: 10 U/L (ref 0–44)
AST: 20 U/L (ref 15–41)
Albumin: 3.4 g/dL — ABNORMAL LOW (ref 3.5–5.0)
Alkaline Phosphatase: 71 U/L (ref 38–126)
Anion gap: 7 (ref 5–15)
BUN: 13 mg/dL (ref 6–20)
CO2: 26 mmol/L (ref 22–32)
Calcium: 8.6 mg/dL — ABNORMAL LOW (ref 8.9–10.3)
Chloride: 103 mmol/L (ref 98–111)
Creatinine, Ser: 0.85 mg/dL (ref 0.61–1.24)
GFR, Estimated: >60 mL/min (ref >60)
Glucose, Bld: 96 mg/dL (ref 70–99)
Potassium: 4.7 mmol/L (ref 3.5–5.1)
Sodium: 136 mmol/L (ref 135–145)
Total Bilirubin: 1.2 mg/dL (ref 0.3–1.2)
Total Protein: 7.8 g/dL (ref 6.5–8.1)

## 2022-02-03 LAB — MRSA NEXT GEN BY PCR, NASAL: MRSA by PCR Next Gen: NOT DETECTED

## 2022-02-03 SURGERY — ENDOBRONCHIAL ULTRASOUND (EBUS)
Anesthesia: General | Laterality: Bilateral

## 2022-02-03 MED ORDER — CLONIDINE HCL 0.3 MG PO TABS
0.3000 mg | ORAL_TABLET | Freq: Two times a day (BID) | ORAL | Status: DC
  Administered 2022-02-03 – 2022-02-11 (×17): 0.3 mg via ORAL
  Filled 2022-02-03 (×17): qty 1

## 2022-02-03 MED ORDER — GUAIFENESIN-DM 100-10 MG/5ML PO SYRP
10.0000 mL | ORAL_SOLUTION | Freq: Four times a day (QID) | ORAL | Status: DC | PRN
  Administered 2022-02-03 – 2022-02-10 (×12): 10 mL via ORAL
  Filled 2022-02-03 (×13): qty 10

## 2022-02-03 MED ORDER — KETOROLAC TROMETHAMINE 15 MG/ML IJ SOLN
15.0000 mg | Freq: Once | INTRAMUSCULAR | Status: AC
  Administered 2022-02-03: 15 mg via INTRAVENOUS
  Filled 2022-02-03: qty 1

## 2022-02-03 MED ORDER — LOSARTAN POTASSIUM 50 MG PO TABS
25.0000 mg | ORAL_TABLET | Freq: Every day | ORAL | Status: DC
  Administered 2022-02-03 – 2022-02-04 (×2): 25 mg via ORAL
  Filled 2022-02-03 (×2): qty 1

## 2022-02-03 MED ORDER — GADOBUTROL 1 MMOL/ML IV SOLN
9.0000 mL | Freq: Once | INTRAVENOUS | Status: AC | PRN
  Administered 2022-02-03: 9 mL via INTRAVENOUS

## 2022-02-03 NOTE — Discharge Planning (Signed)
Oncology Discharge Planning Admission Note  [facility] at [facility] Address: [place], [place],  [address] Hours of Operation:  8am - 5pm, Monday - Friday  Clinic Contact Information:  [phone]) [phone]  Oncology Care Team: Medical Oncologist:    Myrtha Mantis, NP is aware of this hospital admission dated 01/02/22, and the cancer center will follow [place] inpatient care to assist with discharge planning as indicated by the oncologist.  We will arrange follow up care closer to discharge.  Disclaimer:  This [place] note does not imply a formal consult request has been made by the admitting attending for this admission or there will be an inpatient consult completed by oncology.  Please request oncology consults as per standard process as indicated.

## 2022-02-03 NOTE — Consult Note (Addendum)
Richard [name]  [phone] Fax:([phone]   MEDICAL ONCOLOGY - INITIAL CONSULTATION  Referral MD: Dr. Brand Males  Reason for Referral: Right lung mass  HPI: [name] is a 51 year old male with a past medical history significant for hypertension, seizure disorder, COPD, TIA.  The patient was sent to the emergency department by pulmonology due to significant hypertension.  He was scheduled for bronchoscopy by Dr. [name] and preop evaluation showed a blood pressure of 230/145.  He reported a headache but no other symptoms.  Bronchoscopy was not performed due to significant hypertension.  He was also hypoxic.  In the emergency department he was requiring 8 L of oxygen.  CTA chest showed a 10.2 cm mass in the posterior medial right mid and right lower lung field suggesting primary malignant neoplasm with extension to the subcarinal region of the mediastinum.  There was also a satellite nodule lateral to the large mass in the right lung suggesting pulmonary metastatic disease.  He was found to have patchy infiltrates in the posteioinferior right lower lobe suggesting atelectasis/pneumonia.  Negative PE.  Extrinsic compression of segmental and subsegmental branches in the right lower lobe caused by the lung mass.  There was also hyperplasia with possible nodules in both adrenals which could suggest incidental adenomas versus metastatic disease.  Further review of his chart indicates that his right lung mass has been present since at least a CT scan performed on 03/04/2020.  At that time, there was a 2.6 cm mass in the superior segment of the right lower lobe and a 1.2 cm spiculated nodule in the right upper lobe.  He was seen by pulmonology as an outpatient and a PET scan was performed on 04/15/2020 which showed hypermetabolic right lung nodules most indicative of synchronous primary bronchogenic carcinomas, both nodules compatible with stage Ia lesions.  It appears that he did  not follow-up with pulmonology and multiple phone calls were made from their office to the patient.  The patient has been seen by pulmonology already this morning.  Was scheduled for possible bronch today but due to ongoing hypertension, this has been canceled.  He was requiring O2 at 10 L/min this morning.  Pulmonology has requested inpatient PET scan.  When seen today, the patient's wife is at the bedside.  Additionally his RN was in the room.  The patient indicates that he has known about his lung mass for only the past few days.  He does not seem to recall prior pulmonology visits or PET scan.  He reports that prior to admission, he has been feeling well.  He denies recent loss of appetite and weight loss.  He reports headaches and intermittent dizziness.  He reported a one-time episode of right-sided chest discomfort which has now resolved.  He reports a productive cough but no hemoptysis.  He also reports shortness of breath.  Denies abdominal pain, nausea, vomiting.  He reports some lower extremity edema.  The patient expressed on more than one occasion that he was ready to leave the hospital and did not want to stay any longer.  The patient is married.  He has 1 son and several grandchildren.  He has smoked a pack of cigarettes per day since his teens.  Reports rare alcohol use.  He denied illicit drug use.  He reported that his mother had cancer but he does not know what type of cancer she had. Medical oncology was asked to see the patient to make recommendations regarding his lung  mass.  Past Medical History:  Diagnosis Date   Asthma    Brain bleed ([place])    DVT (deep venous thrombosis) ([place]) 02/19/2015   RLE   GERD (gastroesophageal reflux disease)    History of home oxygen therapy    Hypertension    Seizures ([place])    last episode 03/2013   Stroke [facility]   :   Past Surgical History:  Procedure Laterality Date   LEG SURGERY    :   Current Facility-Administered Medications   Medication Dose Route Frequency Provider Last Rate Last Admin   acetaminophen (TYLENOL) tablet 650 mg  650 mg Oral Q6H PRN Rise Patience, MD       Or   acetaminophen (TYLENOL) suppository 650 mg  650 mg Rectal Q6H PRN Rise Patience, MD       albuterol (PROVENTIL) (2.5 MG/3ML) 0.083% nebulizer solution 2.5 mg  2.5 mg Nebulization Q3H PRN Rise Patience, MD   2.5 mg at 02/03/22 8119   cefTRIAXone (ROCEPHIN) 2 g in sodium chloride 0.9 % 100 mL IVPB  2 g Intravenous Q24H Rise Patience, MD   Stopped at 02/02/22 2103   chlorhexidine (PERIDEX) 0.12 % solution 15 mL  15 mL Mouth Rinse BID Rise Patience, MD       Chlorhexidine Gluconate Cloth 2 % PADS 6 each  6 each Topical Daily Rise Patience, MD       cloNIDine (CATAPRES) tablet 0.3 mg  0.3 mg Oral BID Brand Males, MD       guaiFENesin-dextromethorphan (ROBITUSSIN DM) 100-10 MG/5ML syrup 10 mL  10 mL Oral Q6H PRN Anders Simmonds, MD   10 mL at 02/03/22 0513   hydrALAZINE (APRESOLINE) injection 10 mg  10 mg Intravenous Q4H PRN Rise Patience, MD   10 mg at 02/03/22 0541   levETIRAcetam (KEPPRA) IVPB 500 mg/100 mL premix  500 mg Intravenous Q12H Rise Patience, MD   Stopped at 02/03/22 0225   losartan (COZAAR) tablet 25 mg  25 mg Oral Daily Brand Males, MD       MEDLINE mouth rinse  15 mL Mouth Rinse q12n4p Rise Patience, MD       nicotine (NICODERM CQ - dosed in mg/24 hours) patch 14 mg  14 mg Transdermal Daily Rise Patience, MD   14 mg at 02/02/22 1424   nicotine polacrilex (NICORETTE) gum 2 mg  2 mg Oral PRN Rise Patience, MD   2 mg at 02/02/22 1452   umeclidinium-vilanterol (ANORO ELLIPTA) 62.5-25 MCG/ACT 1 puff  1 puff Inhalation Daily Rise Patience, MD   1 puff at 02/03/22 1478      Allergies  Allergen Reactions   Dilaudid [Hydromorphone Hcl] Nausea Only and Other (See Comments)    Bradycardia and hyperthermia, too   Tape Itching and Dermatitis   :   Family History  Problem Relation Age of Onset   Cancer Father    Diabetes Mellitus II Sister   :   Social History   Socioeconomic History   Marital status: Married    Spouse name: Richard [name]   Number of children: 1   Years of education: HS   Highest education level: Not on file  Occupational History    Employer: OTHER    Comment: n/a  Tobacco Use   Smoking status: Every Day    Packs/day: 1.00    Years: 34.00    Pack years: 34.00    Types: Cigarettes  Start date: 53   Smokeless tobacco: Never   Tobacco comments:    1 pack smoked daily [facility] 01/28/22  Vaping Use   Vaping Use: Never used  Substance and Sexual Activity   Alcohol use: Yes    Comment: 01/29/22- last time 6 days ago   Drug use: Not Currently    Types: Cocaine    Comment: 09/2021- last time for cocaine   Sexual activity: Yes  Other Topics Concern   Not on file  Social History Narrative   Patient lives at home alone.   Caffeine Use: 4-5 cups daily   Social Determinants of Health   Financial Resource Strain: Not on file  Food Insecurity: Not on file  Transportation Needs: Not on file  Physical Activity: Not on file  Stress: Not on file  Social Connections: Not on file  Intimate Partner Violence: Not on file  :  Review of Systems: A comprehensive 14 point review of systems was negative except as noted in the HPI.  Exam: Patient Vitals for the past 24 hrs:  BP Temp Temp src Pulse Resp SpO2 Height Weight  02/03/22 0730 -- 97.6 F (36.4 C) Oral -- -- -- -- --  02/03/22 0700 (!) 182/103 -- -- -- (!) 24 -- -- --  02/03/22 0604 -- -- -- 95 (!) 28 95 % -- --  02/03/22 0600 (!) 160/112 -- -- 96 (!) 27 91 % -- --  02/03/22 0545 -- -- -- 84 (!) 27 92 % -- --  02/03/22 0500 (!) 189/114 -- -- -- 19 -- -- --  02/03/22 0455 -- -- -- -- (!) 22 -- -- --  02/03/22 0422 -- -- -- 97 (!) 25 91 % -- --  02/03/22 0420 -- -- -- (!) 103 19 91 % -- --  02/03/22 0415 (!) 197/119 97.8 F (36.6 C) Oral -- (!) 25 --  -- --  02/03/22 0400 (!) 197/111 -- -- (!) 105 (!) 22 (!) 88 % -- --  02/03/22 0330 (!) 176/116 -- -- (!) 101 (!) 24 92 % -- --  02/03/22 0326 -- -- -- -- 19 92 % -- --  02/03/22 0307 -- -- -- -- (!) 26 96 % -- --  02/03/22 0300 (!) 172/112 -- -- -- (!) 26 (!) 86 % -- --  02/03/22 0238 -- -- -- 89 (!) 24 90 % -- --  02/03/22 0200 (!) 169/115 -- -- 80 (!) 21 (!) 89 % -- --  02/03/22 0150 -- -- -- 78 (!) 21 91 % -- 91.4 kg  02/03/22 0140 (!) 182/114 -- -- 79 18 (!) 89 % -- --  02/03/22 0055 (!) 162/97 98.9 F (37.2 C) Oral 87 (!) 21 -- -- --  02/03/22 0045 -- -- -- 89 (!) 22 93 % -- --  02/02/22 2030 (!) 167/102 -- -- (!) 107 (!) 23 94 % -- --  02/02/22 1930 (!) 160/120 -- -- (!) 112 18 93 % -- --  02/02/22 1845 (!) 166/114 -- -- (!) 110 17 93 % -- --  02/02/22 1800 (!) 181/122 -- -- (!) 108 14 91 % -- --  02/02/22 1730 (!) 194/124 -- -- (!) 110 (!) 23 91 % -- --  02/02/22 1715 (!) 178/119 -- -- (!) 101 20 90 % -- --  02/02/22 1700 (!) 174/115 -- -- 94 (!) 26 92 % -- --  02/02/22 1630 (!) 188/135 -- -- (!) 118 15 91 % -- --  02/02/22 1600 Marland Kitchen)  179/118 -- -- (!) 108 (!) 24 (!) 88 % -- --  02/02/22 1545 (!) 197/119 -- -- (!) 104 (!) 25 91 % -- --  02/02/22 1541 -- -- -- -- -- 91 % -- --  02/02/22 1515 (!) 195/139 -- -- -- (!) 24 -- -- --  02/02/22 1445 (!) 195/139 -- -- (!) 108 15 (!) 86 % -- --  02/02/22 1430 (!) 198/134 -- -- -- (!) 27 -- -- --  02/02/22 1415 (!) 200/135 -- -- -- 20 -- -- --  02/02/22 1400 (!) 178/135 -- -- -- (!) 28 -- -- --  02/02/22 1345 (!) 196/135 -- -- (!) 110 (!) 25 (!) 88 % -- --  02/02/22 1321 -- -- -- -- -- -- 5\' 9"  (1.753 m) 96.6 kg  02/02/22 1316 (!) 202/130 98.8 F (37.1 C) Oral (!) 105 (!) 22 90 % -- --  02/02/22 1245 (!) 230/145 -- -- -- -- 90 % -- --  02/02/22 1237 (!) 217/146 98.3 F (36.8 C) Oral (!) 105 20 92 % -- --  02/02/22 1230 (!) 220/146 -- -- (!) 103 -- (!) 83 % -- --  02/02/22 1210 (!) 196/125 -- -- -- -- -- -- --    General: Awake and  alert, no distress Eyes:  no scleral icterus.   ENT:  There were no oropharyngeal lesions.   Lymphatics:  Negative cervical, supraclavicular or axillary adenopathy.   Respiratory: He has expiratory wheezing throughout his posterior lung fields. Cardiovascular:  Regular rate and rhythm, S1/S2, without murmur, rub or gallop.  Trace bilateral lower extremity edema. GI:  abdomen was soft, flat, nontender, nondistended, without organomegaly.   Skin exam was without echymosis, petichae.   Neuro exam was nonfocal. Patient was alert and oriented.     Lab Results  Component Value Date   WBC 11.2 (H) 02/03/2022   HGB 15.3 02/03/2022   HCT 47.1 02/03/2022   PLT 299 02/03/2022   GLUCOSE 96 02/03/2022   CHOL 124 10/13/2021   TRIG 58 10/13/2021   HDL 44 10/13/2021   LDLCALC 68 10/13/2021   ALT 10 02/03/2022   AST 20 02/03/2022   NA 136 02/03/2022   K 4.7 02/03/2022   CL 103 02/03/2022   CREATININE 0.85 02/03/2022   BUN 13 02/03/2022   CO2 26 02/03/2022    CT Angio Chest PE W and/or Wo Contrast  Result Date: 02/02/2022 CLINICAL DATA:  Shortness of breath EXAM: CT ANGIOGRAPHY CHEST WITH CONTRAST TECHNIQUE: Multidetector CT imaging of the chest was performed using the standard protocol during bolus administration of intravenous contrast. Multiplanar CT image reconstructions and MIPs were obtained to evaluate the vascular anatomy. RADIATION DOSE REDUCTION: This exam was performed according to the departmental dose-optimization program which includes automated exposure control, adjustment of the mA and/or kV according to patient size and/or use of iterative reconstruction technique. CONTRAST:  64mL OMNIPAQUE IOHEXOL 350 MG/ML SOLN COMPARISON:  Previous studies including the CT done on 28-Jan-2022 FINDINGS: Cardiovascular: Heart is enlarged in size. There is ectasia of ascending thoracic aorta measuring 4.4 cm. There is homogeneous enhancement in the thoracic aorta. There is ectasia of main pulmonary  artery measuring 3.7 cm suggesting pulmonary arterial hypertension. There are no intraluminal filling defects in the pulmonary artery branches. Contrast density in the some of the small peripheral pulmonary artery branches is less than optimal. There is extrinsic compression of segmental pulmonary artery branches in the right lower lung fields. Mediastinum/Nodes: There are enlarged lymph nodes in  mediastinum. There is a large soft tissue mass in the right lower lobe extending into the mediastinum in the subcarinal region. Lungs/Pleura: There is a large lesion measures proximally 10.2 x 7 cm in the right lower lobe. There is satellite nodule lateral to the large mass measuring 2.7 cm in diameter in the right lower lobe. There are new small patchy infiltrates in the posteroinferior aspect of right lower lobe suggesting atelectasis/pneumonia. There is fluid/mucous filling some of the bronchi in right lower lobe. There is no significant pleural effusion or pneumothorax. Upper Abdomen: There is hyperplasia of left adrenal with possible 2.9 x 1.7 cm nodule. There is 13 mm nodule in the right adrenal. Adrenals are not included in their entirety limiting evaluation. Musculoskeletal: Unremarkable. Review of the MIP images confirms the above findings. IMPRESSION: There is 10.2 cm mass in the posteromedial right mid and right lower lung fields suggesting primary malignant neoplasm with extension to the subcarinal region of mediastinum. There is satellite nodule lateral to the large mass in the right lung suggesting pulmonary metastatic disease. There are new patchy infiltrates in the posteroinferior right lower lobe suggesting atelectasis/pneumonia. There is no evidence of pulmonary artery embolism. There is ectasia of main pulmonary artery suggesting pulmonary arterial hypertension. There is extrinsic compression of segmental and subsegmental branches in the right lower lobe caused by malignant neoplasm. There is no evidence  of thoracic aortic dissection. There is ectasia of ascending thoracic aorta. There is hyperplasia with possible nodules in both adrenals. Findings suggest possible incidental adenomas or metastatic disease. Electronically Signed   By: Elmer Picker M.D.   On: 02/02/2022 18:59   CT Angio Chest PE W and/or Wo Contrast  Result Date: 01/24/2022 CLINICAL DATA:  Right chest pain, shortness of breath EXAM: CT ANGIOGRAPHY CHEST WITH CONTRAST TECHNIQUE: Multidetector CT imaging of the chest was performed using the standard protocol during bolus administration of intravenous contrast. Multiplanar CT image reconstructions and MIPs were obtained to evaluate the vascular anatomy. RADIATION DOSE REDUCTION: This exam was performed according to the departmental dose-optimization program which includes automated exposure control, adjustment of the mA and/or kV according to patient size and/or use of iterative reconstruction technique. CONTRAST:  140mL OMNIPAQUE IOHEXOL 300 MG/ML  SOLN COMPARISON:  Chest radiograph dated 02.  CTA chest dated 10/12/2021. FINDINGS: Cardiovascular: Satisfactory opacification of the lungs bilaterally to the lobar level. No evidence of pulmonary embolism. No evidence of thoracic aortic aneurysm. The heart is normal in size.  No pericardial effusion. Mediastinum/Nodes: Known mass directly invades the right perihilar and subcarinal regions. Visualized thyroid is unremarkable. Lungs/Pleura: Large right lung mass transgressing the major fissure and invading the mediastinum, measuring 10.6 x 8.0 x 9.7 cm, previously 7.5 cm. Associated 2.0 x 3.0 cm satellite mass in the right upper lobe (series 6/image 84), previously 2.5 cm. Mild centrilobular emphysematous changes, upper lung predominant. No pleural effusion or pneumothorax. Upper Abdomen: Stable thickening of the left adrenal gland (series 5/image 146), previously characterized on PET as a benign adrenal adenoma. Musculoskeletal: Mild degenerative  changes of the lower thoracic/upper lumbar spine. Review of the MIP images confirms the above findings. IMPRESSION: No evidence of pulmonary embolism. 10.6 cm right lung cancer with mediastinal invasion and 3.0 cm satellite mass in the right upper lobe, as described above, progressive from prior. Emphysema (ICD10-J43.9). Electronically Signed   By: Julian Hy M.D.   On: 01/24/2022 22:49   DG Chest Portable 1 View  Result Date: 02/02/2022 CLINICAL DATA:  Provided history: Shortness of  breath. EXAM: PORTABLE CHEST 1 VIEW COMPARISON:  Prior chest radiographs 01/24/2022 and earlier. Prior chest CT 01/24/2022. FINDINGS: Heart size within normal limits. Redemonstrated large right central/perihilar lung malignancy with and adjacent right upper lobe satellite mass. Mediastinal involvement was better appreciated on the recent prior chest CT of 01/24/2022. No appreciable airspace consolidation elsewhere. No evidence of pleural effusion or pneumothorax. No acute bony abnormality identified. IMPRESSION: Redemonstrated large right central/perihilar lung malignancy and adjacent right upper lobe satellite mass. Mediastinal involvement was better appreciated on the recent prior chest CT of 01/24/2022. No appreciable airspace consolidation elsewhere. Electronically Signed   By: Kellie Simmering D.O.   On: 02/02/2022 14:47   DG Chest Portable 1 View  Result Date: 01/24/2022 CLINICAL DATA:  concern for volume overload EXAM: PORTABLE CHEST 1 VIEW COMPARISON:  Chest x-ray 07/11/2020, CT angiography chest 10/12/2021 FINDINGS: The heart and mediastinal contours are within normal limits. Large 10 cm right perihilar mass lesion consistent with known malignancy. Surrounding airspace opacity again noted. No pulmonary edema. No pleural effusion. No pneumothorax. No acute osseous abnormality. IMPRESSION: Large 10 cm right perihilar mass lesion consistent with known malignancy. Electronically Signed   By: Iven Finn M.D.   On:  01/24/2022 21:16   VAS [place] LOWER EXTREMITY VENOUS (DVT)  Result Date: 02/03/2022  Lower Venous DVT Study Patient Name:  Richard [name]  Date of Exam:   02/03/2022 Medical Rec #: [id number]     Accession #:    [id number] Date of Birth: 1971/11/30     Patient Gender: M Patient Age:   73 years Exam Location:  [facility] Procedure:      VAS [place] LOWER EXTREMITY VENOUS (DVT) Referring Phys: Gean Birchwood --------------------------------------------------------------------------------  Indications: Edema.  Risk Factors: None identified. Limitations: Poor ultrasound/tissue interface and patient positioning. Comparison Study: No prior studies. Performing Technologist: Oliver Hum RVT  Examination Guidelines: A complete evaluation includes B-mode imaging, spectral Doppler, color Doppler, and power Doppler as needed of all accessible portions of each vessel. Bilateral testing is considered an integral part of a complete examination. Limited examinations for reoccurring indications may be performed as noted. The reflux portion of the exam is performed with the patient in reverse Trendelenburg.  +---------+---------------+---------+-----------+----------+--------------+  RIGHT     Compressibility Phasicity Spontaneity Properties Thrombus Aging  +---------+---------------+---------+-----------+----------+--------------+  CFV       Full            Yes       Yes                                    +---------+---------------+---------+-----------+----------+--------------+  SFJ       Full                                                             +---------+---------------+---------+-----------+----------+--------------+  FV Prox   Full                                                             +---------+---------------+---------+-----------+----------+--------------+  FV Mid  Full                                                              +---------+---------------+---------+-----------+----------+--------------+  FV Distal Full                                                             +---------+---------------+---------+-----------+----------+--------------+  PFV       Full                                                             +---------+---------------+---------+-----------+----------+--------------+  POP       Full            Yes       Yes                                    +---------+---------------+---------+-----------+----------+--------------+  PTV       Full                                                             +---------+---------------+---------+-----------+----------+--------------+  PERO      Full                                                             +---------+---------------+---------+-----------+----------+--------------+   +---------+---------------+---------+-----------+----------+--------------+  LEFT      Compressibility Phasicity Spontaneity Properties Thrombus Aging  +---------+---------------+---------+-----------+----------+--------------+  CFV       Full            Yes       Yes                                    +---------+---------------+---------+-----------+----------+--------------+  SFJ       Full                                                             +---------+---------------+---------+-----------+----------+--------------+  FV Prox   Full                                                             +---------+---------------+---------+-----------+----------+--------------+  FV Mid    Full                                                             +---------+---------------+---------+-----------+----------+--------------+  FV Distal Full                                                             +---------+---------------+---------+-----------+----------+--------------+  PFV       Full                                                              +---------+---------------+---------+-----------+----------+--------------+  POP       Full            Yes       Yes                                    +---------+---------------+---------+-----------+----------+--------------+  PTV       Full                                                             +---------+---------------+---------+-----------+----------+--------------+  PERO      Full                                                             +---------+---------------+---------+-----------+----------+--------------+    Summary: RIGHT: - There is no evidence of deep vein thrombosis in the lower extremity. However, portions of this examination were limited- see technologist comments above.  - No cystic structure found in the popliteal fossa.  LEFT: - There is no evidence of deep vein thrombosis in the lower extremity. However, portions of this examination were limited- see technologist comments above.  - No cystic structure found in the popliteal fossa.  *See table(s) above for measurements and observations.    Preliminary      CT Angio Chest PE W and/or Wo Contrast  Result Date: 02/02/2022 CLINICAL DATA:  Shortness of breath EXAM: CT ANGIOGRAPHY CHEST WITH CONTRAST TECHNIQUE: Multidetector CT imaging of the chest was performed using the standard protocol during bolus administration of intravenous contrast. Multiplanar CT image reconstructions and MIPs were obtained to evaluate the vascular anatomy. RADIATION DOSE REDUCTION: This exam was performed according to the departmental dose-optimization program which includes automated exposure control, adjustment of the mA and/or kV according to patient size and/or use of iterative reconstruction technique. CONTRAST:  48mL OMNIPAQUE IOHEXOL 350 MG/ML SOLN COMPARISON:  Previous studies  including the CT done on 01/24/2022 FINDINGS: Cardiovascular: Heart is enlarged in size. There is ectasia of ascending thoracic aorta measuring 4.4 cm. There is homogeneous  enhancement in the thoracic aorta. There is ectasia of main pulmonary artery measuring 3.7 cm suggesting pulmonary arterial hypertension. There are no intraluminal filling defects in the pulmonary artery branches. Contrast density in the some of the small peripheral pulmonary artery branches is less than optimal. There is extrinsic compression of segmental pulmonary artery branches in the right lower lung fields. Mediastinum/Nodes: There are enlarged lymph nodes in mediastinum. There is a large soft tissue mass in the right lower lobe extending into the mediastinum in the subcarinal region. Lungs/Pleura: There is a large lesion measures proximally 10.2 x 7 cm in the right lower lobe. There is satellite nodule lateral to the large mass measuring 2.7 cm in diameter in the right lower lobe. There are new small patchy infiltrates in the posteroinferior aspect of right lower lobe suggesting atelectasis/pneumonia. There is fluid/mucous filling some of the bronchi in right lower lobe. There is no significant pleural effusion or pneumothorax. Upper Abdomen: There is hyperplasia of left adrenal with possible 2.9 x 1.7 cm nodule. There is 13 mm nodule in the right adrenal. Adrenals are not included in their entirety limiting evaluation. Musculoskeletal: Unremarkable. Review of the MIP images confirms the above findings. IMPRESSION: There is 10.2 cm mass in the posteromedial right mid and right lower lung fields suggesting primary malignant neoplasm with extension to the subcarinal region of mediastinum. There is satellite nodule lateral to the large mass in the right lung suggesting pulmonary metastatic disease. There are new patchy infiltrates in the posteroinferior right lower lobe suggesting atelectasis/pneumonia. There is no evidence of pulmonary artery embolism. There is ectasia of main pulmonary artery suggesting pulmonary arterial hypertension. There is extrinsic compression of segmental and subsegmental branches in  the right lower lobe caused by malignant neoplasm. There is no evidence of thoracic aortic dissection. There is ectasia of ascending thoracic aorta. There is hyperplasia with possible nodules in both adrenals. Findings suggest possible incidental adenomas or metastatic disease. Electronically Signed   By: Elmer Picker M.D.   On: 02/02/2022 18:59   CT Angio Chest PE W and/or Wo Contrast  Result Date: 01/24/2022 CLINICAL DATA:  Right chest pain, shortness of breath EXAM: CT ANGIOGRAPHY CHEST WITH CONTRAST TECHNIQUE: Multidetector CT imaging of the chest was performed using the standard protocol during bolus administration of intravenous contrast. Multiplanar CT image reconstructions and MIPs were obtained to evaluate the vascular anatomy. RADIATION DOSE REDUCTION: This exam was performed according to the departmental dose-optimization program which includes automated exposure control, adjustment of the mA and/or kV according to patient size and/or use of iterative reconstruction technique. CONTRAST:  172mL OMNIPAQUE IOHEXOL 300 MG/ML  SOLN COMPARISON:  Chest radiograph dated 02.  CTA chest dated 10/12/2021. FINDINGS: Cardiovascular: Satisfactory opacification of the lungs bilaterally to the lobar level. No evidence of pulmonary embolism. No evidence of thoracic aortic aneurysm. The heart is normal in size.  No pericardial effusion. Mediastinum/Nodes: Known mass directly invades the right perihilar and subcarinal regions. Visualized thyroid is unremarkable. Lungs/Pleura: Large right lung mass transgressing the major fissure and invading the mediastinum, measuring 10.6 x 8.0 x 9.7 cm, previously 7.5 cm. Associated 2.0 x 3.0 cm satellite mass in the right upper lobe (series 6/image 84), previously 2.5 cm. Mild centrilobular emphysematous changes, upper lung predominant. No pleural effusion or pneumothorax. Upper Abdomen: Stable thickening of the left adrenal gland (series  5/image 146), previously characterized  on PET as a benign adrenal adenoma. Musculoskeletal: Mild degenerative changes of the lower thoracic/upper lumbar spine. Review of the MIP images confirms the above findings. IMPRESSION: No evidence of pulmonary embolism. 10.6 cm right lung cancer with mediastinal invasion and 3.0 cm satellite mass in the right upper lobe, as described above, progressive from prior. Emphysema (ICD10-J43.9). Electronically Signed   By: Julian Hy M.D.   On: 01/24/2022 22:49   DG Chest Portable 1 View  Result Date: 02/02/2022 CLINICAL DATA:  Provided history: Shortness of breath. EXAM: PORTABLE CHEST 1 VIEW COMPARISON:  Prior chest radiographs 01/24/2022 and earlier. Prior chest CT 01/24/2022. FINDINGS: Heart size within normal limits. Redemonstrated large right central/perihilar lung malignancy with and adjacent right upper lobe satellite mass. Mediastinal involvement was better appreciated on the recent prior chest CT of 01/24/2022. No appreciable airspace consolidation elsewhere. No evidence of pleural effusion or pneumothorax. No acute bony abnormality identified. IMPRESSION: Redemonstrated large right central/perihilar lung malignancy and adjacent right upper lobe satellite mass. Mediastinal involvement was better appreciated on the recent prior chest CT of 01/24/2022. No appreciable airspace consolidation elsewhere. Electronically Signed   By: Kellie Simmering D.O.   On: 02/02/2022 14:47   DG Chest Portable 1 View  Result Date: 01/24/2022 CLINICAL DATA:  concern for volume overload EXAM: PORTABLE CHEST 1 VIEW COMPARISON:  Chest x-ray 07/11/2020, CT angiography chest 10/12/2021 FINDINGS: The heart and mediastinal contours are within normal limits. Large 10 cm right perihilar mass lesion consistent with known malignancy. Surrounding airspace opacity again noted. No pulmonary edema. No pleural effusion. No pneumothorax. No acute osseous abnormality. IMPRESSION: Large 10 cm right perihilar mass lesion consistent with  known malignancy. Electronically Signed   By: Iven Finn M.D.   On: 01/24/2022 21:16   VAS [place] LOWER EXTREMITY VENOUS (DVT)  Result Date: 02/03/2022  Lower Venous DVT Study Patient Name:  Richard [name]  Date of Exam:   02/03/2022 Medical Rec #: [id number]     Accession #:    [id number] Date of Birth: [birth year]     Patient Gender: M Patient Age:   51 years Exam Location:  [place] Procedure:      VAS [place] LOWER EXTREMITY VENOUS (DVT) Referring Phys: Gean Birchwood --------------------------------------------------------------------------------  Indications: Edema.  Risk Factors: None identified. Limitations: Poor ultrasound/tissue interface and patient positioning. Comparison Study: No prior studies. Performing Technologist: Oliver Hum RVT  Examination Guidelines: A complete evaluation includes B-mode imaging, spectral Doppler, color Doppler, and power Doppler as needed of all accessible portions of each vessel. Bilateral testing is considered an integral part of a complete examination. Limited examinations for reoccurring indications may be performed as noted. The reflux portion of the exam is performed with the patient in reverse Trendelenburg.  +---------+---------------+---------+-----------+----------+--------------+  RIGHT     Compressibility Phasicity Spontaneity Properties Thrombus Aging  +---------+---------------+---------+-----------+----------+--------------+  CFV       Full            Yes       Yes                                    +---------+---------------+---------+-----------+----------+--------------+  SFJ       Full                                                             +---------+---------------+---------+-----------+----------+--------------+  FV Prox   Full                                                             +---------+---------------+---------+-----------+----------+--------------+  FV Mid    Full                                                              +---------+---------------+---------+-----------+----------+--------------+  FV Distal Full                                                             +---------+---------------+---------+-----------+----------+--------------+  PFV       Full                                                             +---------+---------------+---------+-----------+----------+--------------+  POP       Full            Yes       Yes                                    +---------+---------------+---------+-----------+----------+--------------+  PTV       Full                                                             +---------+---------------+---------+-----------+----------+--------------+  PERO      Full                                                             +---------+---------------+---------+-----------+----------+--------------+   +---------+---------------+---------+-----------+----------+--------------+  LEFT      Compressibility Phasicity Spontaneity Properties Thrombus Aging  +---------+---------------+---------+-----------+----------+--------------+  CFV       Full            Yes       Yes                                    +---------+---------------+---------+-----------+----------+--------------+  SFJ       Full                                                             +---------+---------------+---------+-----------+----------+--------------+  FV Prox   Full                                                             +---------+---------------+---------+-----------+----------+--------------+  FV Mid    Full                                                             +---------+---------------+---------+-----------+----------+--------------+  FV Distal Full                                                             +---------+---------------+---------+-----------+----------+--------------+  PFV       Full                                                              +---------+---------------+---------+-----------+----------+--------------+  POP       Full            Yes       Yes                                    +---------+---------------+---------+-----------+----------+--------------+  PTV       Full                                                             +---------+---------------+---------+-----------+----------+--------------+  PERO      Full                                                             +---------+---------------+---------+-----------+----------+--------------+    Summary: RIGHT: - There is no evidence of deep vein thrombosis in the lower extremity. However, portions of this examination were limited- see technologist comments above.  - No cystic structure found in the popliteal fossa.  LEFT: - There is no evidence of deep vein thrombosis in the lower extremity. However, portions of this examination were limited- see technologist comments above.  - No cystic structure found in the popliteal fossa.  *See table(s) above for measurements and observations.    Preliminary     Assessment and Plan:  1)  Right lung mass -CTA chest 03/04/2020- "2.6 cm mass in the superior segment of the right lower lobe. 1.2 cm spiculated nodule in the right upper lobe. Both areas are  concerning for cancer. Recommend cardiothoracic surgical consultation. These could be further characterized with PET CT and tissue diagnosis." -PET scan 04/15/2020- "1. Hypermetabolic right lung nodules, most indicative of synchronous primary bronchogenic carcinomas. Both nodules are compatible with stage I A lesions. No evidence of metastatic disease." -CTA chest/abdomen/pelvis 10/12/2021- "1. 7.5 cm central/perihilar RIGHT lung malignancy involving both the RIGHT UPPER and RIGHT LOWER lobes, with some extension into the mediastinum. 2.5 cm satellite mass within the RIGHT UPPER lobe. Mildly enlarged RIGHT hilar, RIGHT paratracheal and subcarinal lymph nodes likely representing lymphatic  spread." -CTA chest 02/02/2022- "There is 10.2 cm mass in the  posteromedial right mid and right lower lung fields suggesting primary malignant neoplasm with extension to the subcarinal region of mediastinum. There is satellite nodule lateral to the large mass in the right lung suggesting pulmonary metastatic disease. There are new patchy infiltrates in the posteroinferior right lower lobe suggesting atelectasis/pneumonia. There is no evidence of pulmonary artery embolism. There is ectasia of main pulmonary artery suggesting pulmonary arterial hypertension. There is extrinsic compression of segmental and subsegmental branches in the right lower lobe caused by malignant neoplasm. There is no evidence of thoracic aortic dissection. There is ectasia of ascending thoracic aorta. There is hyperplasia with possible nodules in both adrenals. Findings suggest possible incidental adenomas or metastatic disease."  2)  Hypertensive crisis 3)  Acute respiratory failure with hypoxia 4)  COPD without exacerbation 5)  History of seizure disorder 6)  Tobacco dependence  PLAN: -Discussed CT findings with the patient and his wife.  We discussed that imaging findings are highly suspicious for malignancy, likely lung as primary. -Recommend additional work-up including MRI brain with and without contrast and PET scan.  Pulmonology has ordered the PET scan with an attempt to get this done as an inpatient.  However, if this cannot be completed as an inpatient will need to be done as an outpatient.  We could consider getting a CT of the abdomen/pelvis to see if there are any other sites to consider for biopsy. -Recommend bronchoscopy (if no other sites to biopsy) to obtain tissue diagnosis when the patient is medically stable. -Recommend radiation oncology consult for consideration of palliative radiation to his lung mass. -We will have further discussions regarding systemic treatment options and goals of care depending  staging work-up and biopsy results.  Thank you for this referral.   Mikey Bussing, DNP, AGPCNP-BC, AOCNP    ADDENDUM  .Patient was Personally and independently interviewed, examined and relevant elements of the history of present illness were reviewed in details and an assessment and plan was created. All elements of the patient's history of present illness , assessment and plan were discussed in details with Mikey Bussing, DNP, AGPCNP-BC, AOCNP . The above documentation reflects our combined findings assessment and plan.   Sullivan Lone MD MS

## 2022-02-03 NOTE — Progress Notes (Signed)
PROGRESS NOTE  Richard [name]  DOB: 10-31-1971  PCP: Patient, No Pcp Per (Inactive) OVZ:[id number]  DOA: 02/02/2022  LOS: 1 day  Hospital Day: 2  Brief narrative: Richard [name] is a 51 y.o. male with PMH significant for HTN, stroke, brain bleed, seizure, DVT, GERD, COPD, home oxygen requirement, substance abuse. Patient was recently hospitalized after a brief loss of consciousness and right sided weakness of non-specific etiology-TIA vs Todds paralysis.  As part of the workup, he had a CT of the chest, which demonstrated a large right sided lung mass. He was discharged with plans for pulmonary eval as an outpatient, but was lost to follow up. He was eventually able to follow up with Dr. [name] and was scheduled for EBUS on 2/20.  When he arrived for the procedure he was found to be hypertensive to over 412 systolic and hypoxemic.  He required 10L [place] to keep sats > 90%. He was taken to the ED for further workup and admission.  Admitted to hospitalist service PCCM consulted.  Subjective: Patient was seen and examined this morning.  Middle-aged African-American male.  Propped up in bed.  He refused bronchoscopy earlier.  He said he was hungry for 4 days and could not wait longer. He is not sure if he would agree for any procedure.  Principal Problem:   Acute respiratory failure with hypoxemia (HCC) Active Problems:   Hypertensive urgency   Right lower lobe lung mass   COPD (chronic obstructive pulmonary disease) (HCC)    Assessment and Plan: Rt Lung mass  -Per imaging, it is large 10cm with airway compression  -Pulmonary, oncology, radiation oncology following. -Patient will need bronchoscopy for tissue diagnosis if patient agrees.  Radiation oncology plans to start him on radiation  Hypertensive crisis -Initial blood pressure was elevated over 878 systolic. -Home meds include clonidine 0.3 mg twice daily, losartan 25 mg daily,  Acute on chronic hypoxic respiratory failure -Severe,  requiring 10 L oxygen by nasal cannula. -PE ruled out.   COPD  - Anoro in place of home stiolto - PRN albuterol nebs   Sz disorder -Home meds include Keppra 500 mg twice daily. -Currently on IV Keppra.  Switch to oral postprocedure.  Mobility: Encourage ambulation Goals of care   Code Status: Full Code    Nutritional status:  Body mass index is 29.76 kg/m.      Diet:  Diet Order             Diet heart healthy/carb modified Room service appropriate? Yes; Fluid consistency: Thin  Diet effective now                   DVT prophylaxis:  SCDs Start: 02/02/22 2225   Antimicrobials: None Fluid: None Consultants: Pulmonology Family Communication: Wife at bedside  Status is: Inpatient  Continue in-hospital care because: Pending bronchoscopy, radiation Level of care: Stepdown   Dispo: The patient is from: Home              Anticipated d/c is to: Home, pending clinical course              Patient currently is not medically stable to d/c.   Difficult to place patient No     Infusions:   cefTRIAXone (ROCEPHIN)  IV Stopped (02/02/22 2103)   levETIRAcetam Stopped (02/03/22 1109)    Scheduled Meds:  chlorhexidine  15 mL Mouth Rinse BID   Chlorhexidine Gluconate Cloth  6 each Topical Daily   cloNIDine  0.3 mg Oral  BID   losartan  25 mg Oral Daily   mouth rinse  15 mL Mouth Rinse q12n4p   nicotine  14 mg Transdermal Daily   umeclidinium-vilanterol  1 puff Inhalation Daily    PRN meds: acetaminophen **OR** acetaminophen, albuterol, guaiFENesin-dextromethorphan, hydrALAZINE, nicotine polacrilex   Antimicrobials: Anti-infectives (From admission, onward)    Start     Dose/Rate Route Frequency Ordered Stop   02/02/22 2100  azithromycin (ZITHROMAX) 500 mg in sodium chloride 0.9 % 250 mL IVPB        500 mg 250 mL/hr over 60 Minutes Intravenous  Once 02/02/22 2050 02/02/22 2355   02/02/22 2000  cefTRIAXone (ROCEPHIN) 2 g in sodium chloride 0.9 % 100 mL IVPB         2 g 200 mL/hr over 30 Minutes Intravenous Every 24 hours 02/02/22 1950         Objective: Vitals:   02/03/22 1200 02/03/22 1300  BP: (!) 145/109 122/79  Pulse:    Resp: 15 (!) 31  Temp:    SpO2:      Intake/Output Summary (Last 24 hours) at 02/03/2022 1415 Last data filed at 02/03/2022 1212 Gross per 24 hour  Intake 200 ml  Output 450 ml  Net -250 ml   Filed Weights   02/02/22 1321 02/03/22 0150  Weight: 96.6 kg 91.4 kg   Weight change:  Body mass index is 29.76 kg/m.   Physical Exam: General exam: Pleasant middle-aged African-American male.  Not in physical distress Skin: No rashes, lesions or ulcers. HEENT: Atraumatic, normocephalic, no obvious bleeding Lungs: Clear to auscultate bilaterally, but on 10 L oxygen requirement CVS: Regular rate and rhythm, no murmur GI/Abd soft, nontender, nondistended, bowel sound present CNS: Alert, awake, oriented x3 Psychiatry: Mood appropriate Extremities: No pedal edema, no calf tenderness  Data Review: I have personally reviewed the laboratory data and studies available.  F/u labs  Unresulted Labs (From admission, onward)     Start     Ordered   02/04/22 0500  Phosphorus  Tomorrow morning,   R        02/03/22 0836   02/04/22 0500  Lactic acid, plasma  Tomorrow morning,   R        02/03/22 0836   02/04/22 0500  Procalcitonin  Daily,   R      02/03/22 0836   02/04/22 0500  Protime-INR  Tomorrow morning,   R        02/03/22 0836   02/04/22 0500  Brain natriuretic peptide  Tomorrow morning,   R        02/03/22 0836            Signed, Terrilee Croak, MD [facility] 02/03/2022

## 2022-02-03 NOTE — Consult Note (Signed)
Radiation Oncology         [phone] ________________________________  Name: Richard [name]        MRN: [id number]  Date of Service: 02/03/22 DOB: [birth year]  IR:CVELFYB, No Pcp Per (Inactive)  No ref. provider found     REFERRING PHYSICIAN: Dr. Pietro Cassis   DIAGNOSIS: The encounter diagnosis was Right lower lobe lung mass.   HISTORY OF PRESENT ILLNESS: Richard [name] is a 51 y.o. male seen at the request of Dr. Pietro Cassis for a probable Stage IIIB, cT4N2M0, NSCLC of the RLL.  Apparently the patient has had a known nodule in the right upper lobe concerning for malignancy since the summer 2021.  He has had several admissions and was lost to follow-up until recently when he was seen again in pulmonary medicine.  He presented for bronchoscopy yesterday but when he arrived he was hypertensive and hypoxic requiring 10 L of oxygen to keep his sats above 90%.  He was taken to the emergency department for further work-up, a CT scan with angiography was performed showing a 10.6 cm mass arising in the right lung and extending into the mediastinum and perihilar and subcarinal regions.  No evidence of embolism was seen.  He had stable thickening of the left adrenal gland that has previously been seen on a PET scan and felt to be a benign adenoma by PET in May 2021.  Of note he did have an MRI of his brain in October 2022 that was negative.  He is currently admitted in the ICU stepdown requiring 10 L via nasal cannula.  We have been asked to consider beginning radiotherapy.    PREVIOUS RADIATION THERAPY: No   PAST MEDICAL HISTORY:  Past Medical History:  Diagnosis Date   Asthma    Brain bleed ([place])    DVT (deep venous thrombosis) ([place]) 02/19/2015   RLE   GERD (gastroesophageal reflux disease)    History of home oxygen therapy    Hypertension    Seizures ([place])    last episode 03/2013   Stroke [facility])        PAST SURGICAL HISTORY: Past Surgical History:  Procedure Laterality Date   LEG SURGERY        FAMILY HISTORY:  Family History  Problem Relation Age of Onset   Cancer Father    Diabetes Mellitus II Sister      SOCIAL HISTORY:  reports that he has been smoking cigarettes. He started smoking about 36 years ago. He has a 34.00 pack-year smoking history. He has never used smokeless tobacco. He reports current alcohol use. He reports that he does not currently use drugs after having used the following drugs: Cocaine.  The patient is married and lives in [place].  His wife works for Water engineer at [employer].  He reports that he did not seek treatment for what was suspected to be lung cancer more than a year ago out of concern that the treatments potentially were more harmful than beneficial.  He is active in his grandchildren's life and often takes them to school.   ALLERGIES: Dilaudid [hydromorphone hcl] and Tape   MEDICATIONS:  Current Facility-Administered Medications  Medication Dose Route Frequency Provider Last Rate Last Admin   acetaminophen (TYLENOL) tablet 650 mg  650 mg Oral Q6H PRN Rise Patience, MD       Or   acetaminophen (TYLENOL) suppository 650 mg  650 mg Rectal Q6H PRN Rise Patience, MD       albuterol (PROVENTIL) (2.5  MG/3ML) 0.083% nebulizer solution 2.5 mg  2.5 mg Nebulization Q3H PRN Rise Patience, MD   2.5 mg at 02/03/22 0237   cefTRIAXone (ROCEPHIN) 2 g in sodium chloride 0.9 % 100 mL IVPB  2 g Intravenous Q24H Rise Patience, MD   Stopped at 02/02/22 2103   chlorhexidine (PERIDEX) 0.12 % solution 15 mL  15 mL Mouth Rinse BID Rise Patience, MD   15 mL at 02/03/22 1026   Chlorhexidine Gluconate Cloth 2 % PADS 6 each  6 each Topical Daily Rise Patience, MD       cloNIDine (CATAPRES) tablet 0.3 mg  0.3 mg Oral BID Brand Males, MD   0.3 mg at 02/03/22 1022   guaiFENesin-dextromethorphan (ROBITUSSIN DM) 100-10 MG/5ML syrup 10 mL  10 mL Oral Q6H PRN Anders Simmonds, MD   10 mL at 02/03/22 0513    hydrALAZINE (APRESOLINE) injection 10 mg  10 mg Intravenous Q4H PRN Rise Patience, MD   10 mg at 02/03/22 0541   levETIRAcetam (KEPPRA) IVPB 500 mg/100 mL premix  500 mg Intravenous Q12H Rise Patience, MD   Stopped at 02/03/22 1109   losartan (COZAAR) tablet 25 mg  25 mg Oral Daily Brand Males, MD   25 mg at 02/03/22 1022   MEDLINE mouth rinse  15 mL Mouth Rinse q12n4p Rise Patience, MD   15 mL at 02/03/22 1212   nicotine (NICODERM CQ - dosed in mg/24 hours) patch 14 mg  14 mg Transdermal Daily Rise Patience, MD   14 mg at 02/03/22 1025   nicotine polacrilex (NICORETTE) gum 2 mg  2 mg Oral PRN Rise Patience, MD   2 mg at 02/02/22 1452   umeclidinium-vilanterol (ANORO ELLIPTA) 62.5-25 MCG/ACT 1 puff  1 puff Inhalation Daily Rise Patience, MD   1 puff at 02/03/22 [phone]     REVIEW OF SYSTEMS: On review of systems, the patient reports that he is really been struggling with his breathing for several days.  He has been on oxygen at home between 2 to 3 L for about 2 months time but in the last 3 days his oxygen demands significantly increased.  He has been having a hard time sleeping and getting good rest however falls asleep easily in the midst of conversation.  He is episodes of coughing fits that typically occur while he is awake, and little phlegm can be expelled.  He has been having headaches, and his wife states that these have been ongoing for several weeks no seizures or loss of consciousness have been noted.  No other complaints are verbalized.     PHYSICAL EXAM:  Wt Readings from Last 3 Encounters:  02/03/22 201 lb 8 oz (91.4 kg)  01/28/22 203 lb 6.4 oz (92.3 kg)  10/12/21 224 lb (101.6 kg)   Temp Readings from Last 3 Encounters:  02/03/22 97.6 F (36.4 C) (Oral)  01/28/22 98.5 F (36.9 C) (Oral)  01/24/22 98.6 F (37 C)   BP Readings from Last 3 Encounters:  02/03/22 122/79  01/28/22 (!) 162/86  10/14/21 (!) 133/107   Pulse Readings  from Last 3 Encounters:  02/03/22 (!) 108  01/28/22 90  01/24/22 90   Pain Assessment Pain Score: 8 /10  In general this is an ill appearing African American male in no acute distress. He's alert and oriented to person, but was confused by place.  After further discussion he is aware that he is in the hospital.  He is otherwise appropriate throughout the examination. Cardiopulmonary assessment is negative for acute distress but he exhibits increased effort to breathe when coughing.    ECOG = 3  0 - Asymptomatic (Fully active, able to carry on all predisease activities without restriction)  1 - Symptomatic but completely ambulatory (Restricted in physically strenuous activity but ambulatory and able to carry out work of a light or sedentary nature. For example, light housework, office work)  2 - Symptomatic, <50% in bed during the day (Ambulatory and capable of all self care but unable to carry out any work activities. Up and about more than 50% of waking hours)  3 - Symptomatic, >50% in bed, but not bedbound (Capable of only limited self-care, confined to bed or chair 50% or more of waking hours)  4 - Bedbound (Completely disabled. Cannot carry on any self-care. Totally confined to bed or chair)  5 - Death   Eustace Pen MM, Creech RH, Tormey DC, et al. [phone]). "Toxicity and response criteria of the [facility]". [place] Oncol. 5 (6): 649-55    LABORATORY DATA:  Lab Results  Component Value Date   WBC 11.2 (H) 02/03/2022   HGB 15.3 02/03/2022   HCT 47.1 02/03/2022   MCV 92.4 02/03/2022   PLT 299 02/03/2022   Lab Results  Component Value Date   NA 136 02/03/2022   K 4.7 02/03/2022   CL 103 02/03/2022   CO2 26 02/03/2022   Lab Results  Component Value Date   ALT 10 02/03/2022   AST 20 02/03/2022   ALKPHOS 71 02/03/2022   BILITOT 1.2 02/03/2022      RADIOGRAPHY: CT Angio Chest PE W and/or Wo Contrast  Result Date: 02/02/2022 CLINICAL DATA:   Shortness of breath EXAM: CT ANGIOGRAPHY CHEST WITH CONTRAST TECHNIQUE: Multidetector CT imaging of the chest was performed using the standard protocol during bolus administration of intravenous contrast. Multiplanar CT image reconstructions and MIPs were obtained to evaluate the vascular anatomy. RADIATION DOSE REDUCTION: This exam was performed according to the departmental dose-optimization program which includes automated exposure control, adjustment of the mA and/or kV according to patient size and/or use of iterative reconstruction technique. CONTRAST:  47mL OMNIPAQUE IOHEXOL 350 MG/ML SOLN COMPARISON:  Previous studies including the CT done on Feb 01, 2022 FINDINGS: Cardiovascular: Heart is enlarged in size. There is ectasia of ascending thoracic aorta measuring 4.4 cm. There is homogeneous enhancement in the thoracic aorta. There is ectasia of main pulmonary artery measuring 3.7 cm suggesting pulmonary arterial hypertension. There are no intraluminal filling defects in the pulmonary artery branches. Contrast density in the some of the small peripheral pulmonary artery branches is less than optimal. There is extrinsic compression of segmental pulmonary artery branches in the right lower lung fields. Mediastinum/Nodes: There are enlarged lymph nodes in mediastinum. There is a large soft tissue mass in the right lower lobe extending into the mediastinum in the subcarinal region. Lungs/Pleura: There is a large lesion measures proximally 10.2 x 7 cm in the right lower lobe. There is satellite nodule lateral to the large mass measuring 2.7 cm in diameter in the right lower lobe. There are new small patchy infiltrates in the posteroinferior aspect of right lower lobe suggesting atelectasis/pneumonia. There is fluid/mucous filling some of the bronchi in right lower lobe. There is no significant pleural effusion or pneumothorax. Upper Abdomen: There is hyperplasia of left adrenal with possible 2.9 x 1.7 cm nodule.  There is 13 mm nodule in the right adrenal.  Adrenals are not included in their entirety limiting evaluation. Musculoskeletal: Unremarkable. Review of the MIP images confirms the above findings. IMPRESSION: There is 10.2 cm mass in the posteromedial right mid and right lower lung fields suggesting primary malignant neoplasm with extension to the subcarinal region of mediastinum. There is satellite nodule lateral to the large mass in the right lung suggesting pulmonary metastatic disease. There are new patchy infiltrates in the posteroinferior right lower lobe suggesting atelectasis/pneumonia. There is no evidence of pulmonary artery embolism. There is ectasia of main pulmonary artery suggesting pulmonary arterial hypertension. There is extrinsic compression of segmental and subsegmental branches in the right lower lobe caused by malignant neoplasm. There is no evidence of thoracic aortic dissection. There is ectasia of ascending thoracic aorta. There is hyperplasia with possible nodules in both adrenals. Findings suggest possible incidental adenomas or metastatic disease. Electronically Signed   By: Elmer Picker M.D.   On: 02/02/2022 18:59   CT Angio Chest PE W and/or Wo Contrast  Result Date: 01/24/2022 CLINICAL DATA:  Right chest pain, shortness of breath EXAM: CT ANGIOGRAPHY CHEST WITH CONTRAST TECHNIQUE: Multidetector CT imaging of the chest was performed using the standard protocol during bolus administration of intravenous contrast. Multiplanar CT image reconstructions and MIPs were obtained to evaluate the vascular anatomy. RADIATION DOSE REDUCTION: This exam was performed according to the departmental dose-optimization program which includes automated exposure control, adjustment of the mA and/or kV according to patient size and/or use of iterative reconstruction technique. CONTRAST:  155mL OMNIPAQUE IOHEXOL 300 MG/ML  SOLN COMPARISON:  Chest radiograph dated 02.  CTA chest dated 10/12/2021.  FINDINGS: Cardiovascular: Satisfactory opacification of the lungs bilaterally to the lobar level. No evidence of pulmonary embolism. No evidence of thoracic aortic aneurysm. The heart is normal in size.  No pericardial effusion. Mediastinum/Nodes: Known mass directly invades the right perihilar and subcarinal regions. Visualized thyroid is unremarkable. Lungs/Pleura: Large right lung mass transgressing the major fissure and invading the mediastinum, measuring 10.6 x 8.0 x 9.7 cm, previously 7.5 cm. Associated 2.0 x 3.0 cm satellite mass in the right upper lobe (series 6/image 84), previously 2.5 cm. Mild centrilobular emphysematous changes, upper lung predominant. No pleural effusion or pneumothorax. Upper Abdomen: Stable thickening of the left adrenal gland (series 5/image 146), previously characterized on PET as a benign adrenal adenoma. Musculoskeletal: Mild degenerative changes of the lower thoracic/upper lumbar spine. Review of the MIP images confirms the above findings. IMPRESSION: No evidence of pulmonary embolism. 10.6 cm right lung cancer with mediastinal invasion and 3.0 cm satellite mass in the right upper lobe, as described above, progressive from prior. Emphysema (ICD10-J43.9). Electronically Signed   By: Julian Hy M.D.   On: 01/24/2022 22:49   DG Chest Portable 1 View  Result Date: 02/02/2022 CLINICAL DATA:  Provided history: Shortness of breath. EXAM: PORTABLE CHEST 1 VIEW COMPARISON:  Prior chest radiographs 01/24/2022 and earlier. Prior chest CT 01/24/2022. FINDINGS: Heart size within normal limits. Redemonstrated large right central/perihilar lung malignancy with and adjacent right upper lobe satellite mass. Mediastinal involvement was better appreciated on the recent prior chest CT of 01/24/2022. No appreciable airspace consolidation elsewhere. No evidence of pleural effusion or pneumothorax. No acute bony abnormality identified. IMPRESSION: Redemonstrated large right central/perihilar  lung malignancy and adjacent right upper lobe satellite mass. Mediastinal involvement was better appreciated on the recent prior chest CT of 01/24/2022. No appreciable airspace consolidation elsewhere. Electronically Signed   By: Kellie Simmering D.O.   On: 02/02/2022 14:47   DG  Chest Portable 1 View  Result Date: 01/24/2022 CLINICAL DATA:  concern for volume overload EXAM: PORTABLE CHEST 1 VIEW COMPARISON:  Chest x-ray 07/11/2020, CT angiography chest 10/12/2021 FINDINGS: The heart and mediastinal contours are within normal limits. Large 10 cm right perihilar mass lesion consistent with known malignancy. Surrounding airspace opacity again noted. No pulmonary edema. No pleural effusion. No pneumothorax. No acute osseous abnormality. IMPRESSION: Large 10 cm right perihilar mass lesion consistent with known malignancy. Electronically Signed   By: Iven Finn M.D.   On: 01/24/2022 21:16   VAS [place] LOWER EXTREMITY VENOUS (DVT)  Result Date: 02/03/2022  Lower Venous DVT Study Patient Name:  PESACH FRISCH  Date of Exam:   02/03/2022 Medical Rec #: [id number]     Accession #:    [id number] Date of Birth: 1971-01-19     Patient Gender: M Patient Age:   64 years Exam Location:  [facility] Procedure:      VAS [place] LOWER EXTREMITY VENOUS (DVT) Referring Phys: Gean Birchwood --------------------------------------------------------------------------------  Indications: Edema.  Risk Factors: None identified. Limitations: Poor ultrasound/tissue interface and patient positioning. Comparison Study: No prior studies. Performing Technologist: Oliver Hum RVT  Examination Guidelines: A complete evaluation includes B-mode imaging, spectral Doppler, color Doppler, and power Doppler as needed of all accessible portions of each vessel. Bilateral testing is considered an integral part of a complete examination. Limited examinations for reoccurring indications may be performed as noted. The reflux portion of the exam is  performed with the patient in reverse Trendelenburg.  +---------+---------------+---------+-----------+----------+--------------+  RIGHT     Compressibility Phasicity Spontaneity Properties Thrombus Aging  +---------+---------------+---------+-----------+----------+--------------+  CFV       Full            Yes       Yes                                    +---------+---------------+---------+-----------+----------+--------------+  SFJ       Full                                                             +---------+---------------+---------+-----------+----------+--------------+  FV Prox   Full                                                             +---------+---------------+---------+-----------+----------+--------------+  FV Mid    Full                                                             +---------+---------------+---------+-----------+----------+--------------+  FV Distal Full                                                             +---------+---------------+---------+-----------+----------+--------------+  PFV       Full                                                             +---------+---------------+---------+-----------+----------+--------------+  POP       Full            Yes       Yes                                    +---------+---------------+---------+-----------+----------+--------------+  PTV       Full                                                             +---------+---------------+---------+-----------+----------+--------------+  PERO      Full                                                             +---------+---------------+---------+-----------+----------+--------------+   +---------+---------------+---------+-----------+----------+--------------+  LEFT      Compressibility Phasicity Spontaneity Properties Thrombus Aging  +---------+---------------+---------+-----------+----------+--------------+  CFV       Full            Yes       Yes                                     +---------+---------------+---------+-----------+----------+--------------+  SFJ       Full                                                             +---------+---------------+---------+-----------+----------+--------------+  FV Prox   Full                                                             +---------+---------------+---------+-----------+----------+--------------+  FV Mid    Full                                                             +---------+---------------+---------+-----------+----------+--------------+  FV Distal Full                                                             +---------+---------------+---------+-----------+----------+--------------+  PFV       Full                                                             +---------+---------------+---------+-----------+----------+--------------+  POP       Full            Yes       Yes                                    +---------+---------------+---------+-----------+----------+--------------+  PTV       Full                                                             +---------+---------------+---------+-----------+----------+--------------+  PERO      Full                                                             +---------+---------------+---------+-----------+----------+--------------+     Summary: RIGHT: - There is no evidence of deep vein thrombosis in the lower extremity. However, portions of this examination were limited- see technologist comments above.  - No cystic structure found in the popliteal fossa.  LEFT: - There is no evidence of deep vein thrombosis in the lower extremity. However, portions of this examination were limited- see technologist comments above.  - No cystic structure found in the popliteal fossa.  *See table(s) above for measurements and observations. Electronically signed by Servando Snare MD on 02/03/2022 at 11:15:12 AM.    Final        IMPRESSION/PLAN: 1. Probable Stage IIIB, cT4N2M0, NSCLC, of  the RUL. Dr. Lisbeth Renshaw has reviewed his case and today we discussed the imaging findings to date.  We spent time today discussing the limitations in his current situation and that it is not safe to undergo bronchoscopy with his oxygen demands and blood pressure. Dr. Gerome Apley is considering inpatient PET scan to see if there are any more amendable sites to proceed with a CT guided biopsy. PET will likely be difficult to obtain due to his inpatient status. We also recommend MRI of the brain given the amount of time since his last scan especially since he's having headaches to rule out metastatic disease. Dr. Irene Limbo has also weighed in and would recommend proceeding in a palliative fashion with radiation if Dr. Lisbeth Renshaw is in agreement, and consider systemic therapy once he's medically stable to proceed with biopsy (if another site cannot be identified to safely sample). Taking all of this into consideration, while certainly not ideal, Dr. Lisbeth Renshaw is in agreement with a palliative course of radiotherapy to the right chest without tissue confirmation of his clinically diagnosed lung cancer. The patient and his wife were counseled on the risks of proceeding without tissue confirmation and are in agreement to proceed with radiation with the hopes of radiation medially stabilizing  his overall pulmonary function so that more definitive biopsies can be accomplished when he's more stable.  We discussed the risks, benefits, short, and long term effects of radiotherapy, as well as the palliative intent, and the patient is interested in proceeding. I discussed the delivery and logistics of radiotherapy and that Dr. Lisbeth Renshaw recommends 10 fractions of radiotherapy to the RUL mass and regional nodes. If his work up continues to show locally advanced disease rather than metastatic, we could consider additional radiotherapy at that time. Written consent is obtained and placed in the chart, a copy was provided to the patient. He will simulate  tomorrow morning, and in "mark and start" fashion, proceed with radiotherapy tomorrow.   In a visit lasting 75 minutes, greater than 50% of the time was spent face to face discussing the patient's condition, in preparation for the discussion, and coordinating the patient's care.     Carola Rhine, [facility]   **Disclaimer: This note was dictated with voice recognition software. Similar sounding words can inadvertently be transcribed and this note may contain transcription errors which may not have been corrected upon publication of note.**

## 2022-02-03 NOTE — Progress Notes (Signed)
Bilateral lower extremity venous duplex has been completed. Preliminary results can be found in CV Proc through chart review.   02/03/22 8:57 AM Richard [name] RVT

## 2022-02-03 NOTE — ED Provider Notes (Signed)
°  Physical Exam  BP (!) 182/114 (BP Location: Left Arm)    Pulse 78    Temp 98.9 F (37.2 C) (Oral)    Resp (!) 21    Ht 5\' 9"  (1.753 m)    Wt 91.4 kg    SpO2 91%    BMI 29.76 kg/m   Physical Exam  Procedures  Procedures  ED Course / MDM    Medical Decision Making Amount and/or Complexity of Data Reviewed Labs: ordered. Radiology: ordered.  Risk Prescription drug management. Decision regarding hospitalization.   Received care from previous providers. Please see notes for hx, physican and care.   Briefly, this is a 51 yo male who presents with concern for acute hypoxic respiratory failure.  PE study completed and without PE, does show concern for pulmonary mets, extrinsic compression of pulmonary arteries by mass and pulmonary hypertension.  Possible pneumonia-given rocephin/azithromycin.    Dr. Tamala Julian of [place] evaluated. Will plan on admission to [facility] for continued evaluation, bronchoscopy, radiation oncology.        Gareth Morgan, MD 02/03/22 [phone]

## 2022-02-03 NOTE — Consult Note (Signed)
NAME:  Richard [name], MRN:  [id number], DOB:  [birth year], LOS: 1 ADMISSION DATE:  02/02/2022, CONSULTATION DATE:  2/20 REFERRING MD:  Dr. Billy Fischer, CHIEF COMPLAINT:  Lung mass   BRIEF  51 year old male with PMH as below, which is significant for COPD, seizure disorder, HTN, substance abuse, and TIA. Had a recent admission after a brief loss of consciousness and right sided weakness of non-specific etiology. TIA vs Todds paralysis. As part of the workup he had a CT of the chest, which demonstrated a large right sided lung mass. He was discharged with plans for pulmonary eval as an outpatient, but was lost to follow up. He was eventually able to follow up with Dr. [name] and was scheduled for EBUS on 2/20. When he arrived for the procedure he was found to be hypertensive and hypoxemic. Required 10L [place] to keep sats > 90%. He was taken to the ED for further workup and admission. PCCM asked to evaluate.   Pertinent  Medical History   has a past medical history of Asthma, Brain bleed ([place]), DVT (deep venous thrombosis) ([place]) (02/19/2015), GERD (gastroesophageal reflux disease), History of home oxygen therapy, Hypertension, Seizures ([place]), and Stroke ([place]).   Significant Hospital Events: Including procedures, antibiotic start and stop dates in addition to other pertinent events   02/02/2022 - admit  Interim History / Subjective:   02/03/22 - on 10L [place]. Pulse ox 95%. On abx and keppra. Hungry . Wants to eat ad drink. NPO for bronch but BP very high - MAP 148 and he himself says he is unsafe for bronch. Given bronch deelays due to BP he is askign for emprici xRT and chemo.   Objective   Blood pressure (!) 182/103, pulse 95, temperature 97.8 F (36.6 C), temperature source Oral, resp. rate (!) 24, height 5\' 9"  (1.753 m), weight 91.4 kg, SpO2 95 %.        Intake/Output Summary (Last 24 hours) at 02/03/2022 0758 Last data filed at 02/03/2022 0630 Gross per 24 hour  Intake 100 ml  Output 450 ml  Net  -350 ml   Filed Weights   02/02/22 1321 02/03/22 0150  Weight: 96.6 kg 91.4 kg    Examination: General Appearance:  Looks stable on 10 LNC. On bed. Getting duplex LE Head:  Normocephalic, without obvious abnormality, atraumatic Eyes:  PERRL - yes, conjunctiva/corneas - muddy     Ears:  Normal external ear canals, both ears Nose:  G tube - no but [place] on 10L  Throat:  ETT TUBE - no , OG tube - no Neck:  Supple,  No enlargement/tenderness/nodules Lungs: Clear to auscultation bilaterally, Heart:  S1 and S2 normal, no murmur, CVP - no.  Pressors - no Abdomen:  Soft, no masses, no organomegaly Genitalia / Rectal:  Not done Extremities:  Extremities- intact Skin:  ntact in exposed areas . Sacral area - not examined Neurologic:  Sedation - none -> RASS - +1 . Moves all 4s - yes. CAM-ICU - neg . Orientation - x3+     Resolved Hospital Problem list     Assessment & Plan:   Hypoxemia - Acute hypxoxemic resp failure - severe - needing 10L [place] - Present on Admit. - PE ruled out , ? Due to mass v shunt  Rt Lung mass - large 10cm with airway compression - Present on Admit    - needs bronch but unsafe due to severe hypertension  Plan  - cancel bronch  (need to get BP  stabilized) - - Supplemental O2 to keep sats > 92%; racial pulse o x gap - XRT consult and onc consult needed - patient pessimistic about getting bronch because he feels his BP might never be cotnrolled - get echo rule out shunt  Hypertensive crisis  - Present on Admit  02/03/2022 = - MAP 148 and on hydralzine prn  Plan  - restart home meds  - aim for 25% correction (has prior hx of strokes)  - get ECHP  - management per [facility]  COPD without acute exacerbation - Anoro in place of home stiolto - PRN albuterol nebs   Sz disorder on dilantin - per [facility]  Best Practice (right click and "Reselect all SmartList Selections" daily)   Diet/type: Regular consistency (see orders) - start diet  DVT prophylaxis: other  waiting to see if he needs full AC GI prophylaxis: N/A Lines: N/A Foley:  N/A Code Status:  full code Last date of multidisciplinary goals of care discussion [ ]       ATTESTATION & SIGNATURE   The patient Richard [name] is critically ill with multiple organ systems failure and requires high complexity decision making for assessment and support, frequent evaluation and titration of therapies, application of advanced monitoring technologies and extensive interpretation of multiple databases.   Critical Care Time devoted to patient care services described in this note is  35  Minutes. This time reflects time of care of this signee Dr Brand Males. This critical care time does not reflect procedure time, or teaching time or supervisory time of PA/NP/Med student/Med Resident etc but could involve care discussion time     Dr. Brand Males, M.D., [facility].C.P Pulmonary and Critical Care Medicine Staff Physician [facility] Pager: [phone], If no answer or between  15:00h - 7:00h: call [phone]  02/03/2022 7:58 AM    LABS    Latest Reference Range & Units 05/09/20 01:08 10/12/21 18:00 10/12/21 21:19 01/24/22 20:55 02/02/22 14:45 02/02/22 18:50  Troponin I (High Sensitivity) <18 ng/L 8 7 9 12 11 10    PULMONARY Recent Labs  Lab 02/02/22 1405  HCO3 27.6  TCO2 29  O2SAT 97    CBC Recent Labs  Lab 02/02/22 1401 02/02/22 1405 02/03/22 0248  HGB 15.5 17.0 15.3  HCT 48.9 50.0 47.1  WBC 9.6  --  11.2*  PLT 335  --  299    COAGULATION No results for input(s): INR in the last 168 hours.  CARDIAC  No results for input(s): TROPONINI in the last 168 hours. No results for input(s): PROBNP in the last 168 hours.   CHEMISTRY Recent Labs  Lab 02/02/22 1405 02/02/22 1445 02/03/22 0248  NA 133* 138 136  K 7.5* 4.2 4.7  CL  --  102 103  CO2  --  26 26  GLUCOSE  --  99 96  BUN  --  8 13  CREATININE  --  0.90 0.85   CALCIUM  --  9.2 8.6*   Estimated Creatinine Clearance: 114.9 mL/min (by C-G formula based on SCr of 0.85 mg/dL).   LIVER Recent Labs  Lab 02/02/22 1445 02/03/22 0248  AST 15 20  ALT 9 10  ALKPHOS 66 71  BILITOT 0.3 1.2  PROT 7.8 7.8  ALBUMIN 3.3* 3.4*     INFECTIOUS No results for input(s): LATICACIDVEN, PROCALCITON in the last 168 hours.   ENDOCRINE CBG (last 3)  Recent Labs    02/02/22 2348  02/03/22 0329 02/03/22 0734  GLUCAP 130* 113* 106*         IMAGING x48h  - image(s) personally visualized  -   highlighted in bold CT Angio Chest PE W and/or Wo Contrast  Result Date: 02/02/2022 CLINICAL DATA:  Shortness of breath EXAM: CT ANGIOGRAPHY CHEST WITH CONTRAST TECHNIQUE: Multidetector CT imaging of the chest was performed using the standard protocol during bolus administration of intravenous contrast. Multiplanar CT image reconstructions and MIPs were obtained to evaluate the vascular anatomy. RADIATION DOSE REDUCTION: This exam was performed according to the departmental dose-optimization program which includes automated exposure control, adjustment of the mA and/or kV according to patient size and/or use of iterative reconstruction technique. CONTRAST:  55mL OMNIPAQUE IOHEXOL 350 MG/ML SOLN COMPARISON:  Previous studies including the CT done on 2022-02-05 FINDINGS: Cardiovascular: Heart is enlarged in size. There is ectasia of ascending thoracic aorta measuring 4.4 cm. There is homogeneous enhancement in the thoracic aorta. There is ectasia of main pulmonary artery measuring 3.7 cm suggesting pulmonary arterial hypertension. There are no intraluminal filling defects in the pulmonary artery branches. Contrast density in the some of the small peripheral pulmonary artery branches is less than optimal. There is extrinsic compression of segmental pulmonary artery branches in the right lower lung fields. Mediastinum/Nodes: There are enlarged lymph nodes in mediastinum. There is  a large soft tissue mass in the right lower lobe extending into the mediastinum in the subcarinal region. Lungs/Pleura: There is a large lesion measures proximally 10.2 x 7 cm in the right lower lobe. There is satellite nodule lateral to the large mass measuring 2.7 cm in diameter in the right lower lobe. There are new small patchy infiltrates in the posteroinferior aspect of right lower lobe suggesting atelectasis/pneumonia. There is fluid/mucous filling some of the bronchi in right lower lobe. There is no significant pleural effusion or pneumothorax. Upper Abdomen: There is hyperplasia of left adrenal with possible 2.9 x 1.7 cm nodule. There is 13 mm nodule in the right adrenal. Adrenals are not included in their entirety limiting evaluation. Musculoskeletal: Unremarkable. Review of the MIP images confirms the above findings. IMPRESSION: There is 10.2 cm mass in the posteromedial right mid and right lower lung fields suggesting primary malignant neoplasm with extension to the subcarinal region of mediastinum. There is satellite nodule lateral to the large mass in the right lung suggesting pulmonary metastatic disease. There are new patchy infiltrates in the posteroinferior right lower lobe suggesting atelectasis/pneumonia. There is no evidence of pulmonary artery embolism. There is ectasia of main pulmonary artery suggesting pulmonary arterial hypertension. There is extrinsic compression of segmental and subsegmental branches in the right lower lobe caused by malignant neoplasm. There is no evidence of thoracic aortic dissection. There is ectasia of ascending thoracic aorta. There is hyperplasia with possible nodules in both adrenals. Findings suggest possible incidental adenomas or metastatic disease. Electronically Signed   By: Elmer Picker M.D.   On: 02/02/2022 18:59   DG Chest Portable 1 View  Result Date: 02/02/2022 CLINICAL DATA:  Provided history: Shortness of breath. EXAM: PORTABLE CHEST 1 VIEW  COMPARISON:  Prior chest radiographs 2022-02-05 and earlier. Prior chest CT 2022-02-05. FINDINGS: Heart size within normal limits. Redemonstrated large right central/perihilar lung malignancy with and adjacent right upper lobe satellite mass. Mediastinal involvement was better appreciated on the recent prior chest CT of 2022-02-05. No appreciable airspace consolidation elsewhere. No evidence of pleural effusion or pneumothorax. No acute bony abnormality identified. IMPRESSION: Redemonstrated large right central/perihilar lung malignancy  and adjacent right upper lobe satellite mass. Mediastinal involvement was better appreciated on the recent prior chest CT of 01/24/2022. No appreciable airspace consolidation elsewhere. Electronically Signed   By: Kellie Simmering D.O.   On: 02/02/2022 14:47

## 2022-02-03 NOTE — Progress Notes (Signed)
eLink Physician-Brief Progress Note Patient Name: Richard [name] DOB: 1971/07/15 MRN: [id number]   Date of Service  02/03/2022  HPI/Events of Note  Patient traveled to bathroom without his supplemental O2 resulting in desaturation and patient c/o chest pain. Patient reports that he is improving now. EKG done and reveals normal sinus rhythm Minimal voltage criteria for LVH, may be normal variant ( Sokolow-Lyon ). Also remains hypertensive with BP = 197/119 in spite of Hyralazine at 1:45 AM. Has had 3 negative Troponins earlier. Sat now = 94% and RR = 17. Patient with history of allergy to Dilaudid and asthma.  eICU Interventions  Plan: Toradol 15 mg IV X 1 now.      Intervention Category Major Interventions: Other:  Berta Denson Cornelia Copa 02/03/2022, 5:07 AM

## 2022-02-03 NOTE — ED Notes (Signed)
Called [facility] for transport, pt has 2 people in front of him.

## 2022-02-03 NOTE — Progress Notes (Incomplete)
0410- Pt complaining of 10/10 sudden and sharp chest pain. He has also required increase need for oxygen. Currently on 10L [place] SpO2 89. Within a couple minutes chest pain dec to 5/10. EKG shows NSR.

## 2022-02-03 NOTE — Addendum Note (Signed)
Encounter addended by: Hayden Pedro, PA-C on: 02/03/2022 3:00 PM  Actions taken: Problem List modified, Visit diagnoses modified

## 2022-02-04 ENCOUNTER — Inpatient Hospital Stay (HOSPITAL_COMMUNITY): Payer: Medicare HMO

## 2022-02-04 ENCOUNTER — Ambulatory Visit
Admit: 2022-02-04 | Discharge: 2022-02-04 | Disposition: A | Discharge status: Home / Self Care | Payer: Medicare HMO | Attending: Radiation Oncology | Admitting: Radiation Oncology

## 2022-02-04 DIAGNOSIS — J449 Chronic obstructive pulmonary disease, unspecified: Secondary | ICD-10-CM | POA: Diagnosis not present

## 2022-02-04 DIAGNOSIS — J9601 Acute respiratory failure with hypoxia: Secondary | ICD-10-CM | POA: Diagnosis not present

## 2022-02-04 DIAGNOSIS — J9621 Acute and chronic respiratory failure with hypoxia: Secondary | ICD-10-CM | POA: Diagnosis not present

## 2022-02-04 DIAGNOSIS — Z7189 Other specified counseling: Secondary | ICD-10-CM

## 2022-02-04 DIAGNOSIS — G40909 Epilepsy, unspecified, not intractable, without status epilepticus: Secondary | ICD-10-CM

## 2022-02-04 DIAGNOSIS — R918 Other nonspecific abnormal finding of lung field: Secondary | ICD-10-CM | POA: Diagnosis not present

## 2022-02-04 DIAGNOSIS — I160 Hypertensive urgency: Secondary | ICD-10-CM | POA: Diagnosis not present

## 2022-02-04 LAB — ECHOCARDIOGRAM COMPLETE BUBBLE STUDY
AR max vel: 3.04 cm2
AV Area VTI: 2.89 cm2
AV Area mean vel: 2.84 cm2
AV Mean grad: 5 mmHg
AV Peak grad: 7.6 mmHg
Ao pk vel: 1.38 m/s
Area-P 1/2: 2.95 cm2
Calc EF: 56.8 %
P 1/2 time: 831 msec
S' Lateral: 2.8 cm
Single Plane A2C EF: 56.1 %
Single Plane A4C EF: 52.7 %

## 2022-02-04 LAB — LACTIC ACID, PLASMA: Lactic Acid, Venous: 1 mmol/L (ref 0.5–1.9)

## 2022-02-04 LAB — GLUCOSE, CAPILLARY
Glucose-Capillary: 110 mg/dL — ABNORMAL HIGH (ref 70–99)
Glucose-Capillary: 114 mg/dL — ABNORMAL HIGH (ref 70–99)
Glucose-Capillary: 108 mg/dL — ABNORMAL HIGH (ref 70–99)
Glucose-Capillary: 118 mg/dL — ABNORMAL HIGH (ref 70–99)

## 2022-02-04 LAB — BRAIN NATRIURETIC PEPTIDE: B Natriuretic Peptide: 21.5 pg/mL (ref 0.0–100.0)

## 2022-02-04 LAB — PHOSPHORUS: Phosphorus: 2.9 mg/dL (ref 2.5–4.6)

## 2022-02-04 LAB — PROTIME-INR
INR: 1 (ref 0.8–1.2)
Prothrombin Time: 13.1 seconds (ref 11.4–15.2)

## 2022-02-04 LAB — PROCALCITONIN: Procalcitonin: 1.13 ng/mL

## 2022-02-04 MED ORDER — ADULT MULTIVITAMIN W/MINERALS CH
1.0000 | ORAL_TABLET | Freq: Every day | ORAL | Status: DC
  Administered 2022-02-11: 1 via ORAL
  Filled 2022-02-04 (×5): qty 1

## 2022-02-04 MED ORDER — ORAL CARE MOUTH RINSE
15.0000 mL | Freq: Two times a day (BID) | OROMUCOSAL | Status: DC
  Administered 2022-02-04 – 2022-02-11 (×11): 15 mL via OROMUCOSAL

## 2022-02-04 MED ORDER — SODIUM CHLORIDE 0.9 % IV SOLN: INTRAVENOUS | Status: DC | PRN

## 2022-02-04 MED ORDER — ENSURE ENLIVE PO LIQD
237.0000 mL | Freq: Two times a day (BID) | ORAL | Status: DC
  Administered 2022-02-04 – 2022-02-11 (×12): 237 mL via ORAL

## 2022-02-04 MED ORDER — MAGNESIUM SULFATE 2 GM/50ML IV SOLN
2.0000 g | Freq: Once | INTRAVENOUS | Status: AC
  Administered 2022-02-04: 2 g via INTRAVENOUS
  Filled 2022-02-04: qty 50

## 2022-02-04 MED ORDER — FLUDEOXYGLUCOSE F - 18 (FDG) INJECTION
10.0000 | Freq: Once | INTRAVENOUS | Status: AC
  Administered 2022-02-04: 11.8 via INTRAVENOUS

## 2022-02-04 NOTE — Assessment & Plan Note (Addendum)
-   Initial blood pressure was elevated over 147 systolic.  Blood pressure has much improved.  Continue clonidine, losartan and hydrochlorthiazide

## 2022-02-04 NOTE — Assessment & Plan Note (Addendum)
-   Continue Keppra.  Switch to oral after procedure today.

## 2022-02-04 NOTE — Assessment & Plan Note (Addendum)
-  currently on 6L oxygen via high flow nasal cannula.  Wean off as able. -PE ruled out.

## 2022-02-04 NOTE — Assessment & Plan Note (Addendum)
-   Continue current inhaled regimen -Continue prednisone.

## 2022-02-04 NOTE — Progress Notes (Signed)
Initial Nutrition Assessment RD working remotely.   DOCUMENTATION CODES:   Not applicable  INTERVENTION:  - will order Ensure Plus High Protein BID, each supplement provides 350 kcal and 20 grams of protein. - will order 1 tablet multivitamin with minerals daily. - liberalize diet from Heart Healthy/Carb Modified to Heart Healthy (cleared by MD).   NUTRITION DIAGNOSIS:   Increased nutrient needs related to acute illness, chronic illness as evidenced by estimated needs.  GOAL:   Patient will meet greater than or equal to 90% of their needs  MONITOR:   PO intake, Supplement acceptance, Labs, Weight trends  REASON FOR ASSESSMENT:   Diagnosis  ASSESSMENT:   51 y.o. male with medical history of HTN, seizure disorder, COPD, TIA, asthma, GERD, and diagnosed with a R lung mass. He was scheduled for EBUS but was found to be hypoxic and hypotensive and was referred to the ED. Patient reported being short of breath for the last few months since the diagnosis of his lung mass. He was admitted for acute respiratory failure with hypoxemia.  Patient had MRI head earlier this AM. Diet advanced from NPO to Heart Healthy/Carb Modified yesterday at 0828 and no meal completion percentages documented since that time.   He screened on the MST screening report on 10/14/21. A brief note was entered by RD on that date as patient was eating 75-100% at meals at that time and was planning to be discharged that date.   Weight yesterday was 201.5 lb and weight on 2/20 documented as 213 lb and appears to be a stated weight. Weight on 01/28/22 was 203 lb. Prior to that, the most recently documented weight was 223 lb on 10/12/21. This indicates 21.5 lb weight loss (9.6% body weight) in 4 months; significant for time frame.  Per notes: - R lung mass-- will need bronch for tissue dx; plan for initiation of XRT - hypertensive crisis - severe acute on chronic hypoxic respiratory failure   Labs reviewed; CBGs:  110, 114 mg/dl, Mg and Phos low end of WDL, Ca: 8.6 mg/dl. Medications reviewed.    NUTRITION - FOCUSED PHYSICAL EXAM:  RD working remotely.  Diet Order:   Diet Order             Diet Heart Room service appropriate? Yes; Fluid consistency: Thin  Diet effective now                   EDUCATION NEEDS:   No education needs have been identified at this time  Skin:  Skin Assessment: Reviewed RN Assessment  Last BM:  PTA/unknown  Height:   Ht Readings from Last 1 Encounters:  02/02/22 5\' 9"  (1.753 m)    Weight:   Wt Readings from Last 1 Encounters:  02/03/22 91.4 kg     BMI:  Body mass index is 29.76 kg/m.  Estimated Nutritional Needs:  Kcal:  2200-2400 kcal Protein:  110-125 grams Fluid:  >/= 2.2 L/day     Jarome Matin, MS, RD, LDN Inpatient Clinical Dietitian RD pager # available in [place]  After hours/weekend pager # available in [facility]

## 2022-02-04 NOTE — Hospital Course (Addendum)
51 y.o. male with PMH significant for HTN, stroke, brain bleed, seizure, DVT, GERD, COPD, home oxygen requirement, substance abuse, recent hospitalization for brief loss of consciousness and right-sided weakness possibly from TIA versus Todd's paralysis; as part of work-up CT chest showed large right-sided lung mass for which she was supposed to follow-up with pulmonary as an outpatient but was lost to follow-up.  He eventually was scheduled for EBUS by PCCM on 02/02/2022. When he arrived for the procedure he was found to be hypertensive to over 984 systolic and hypoxemic.  He required 10L [place] to keep sats > 90%. He was taken to the ED for further workup and admission. Admitted to hospitalist service. PCCM consulted.  Oncology/radiation oncology were also consulted.  He was scheduled to have bronchoscopy on 02/06/2022 but he refused it.  Subsequently palliative care was consulted.  Patient subsequently changed his mind and is currently agreeable for bronchoscopy.  He is scheduled for bronchoscopy today.

## 2022-02-04 NOTE — Progress Notes (Signed)
° °  Echocardiogram 2D Echocardiogram has been performed.  Richard [name] 02/04/2022, 3:15 PM

## 2022-02-04 NOTE — Consult Note (Addendum)
NAME:  Richard [name], MRN:  [id number], DOB:  01/03/1971, LOS: 2 ADMISSION DATE:  02/02/2022, CONSULTATION DATE:  2/20 REFERRING MD:  Dr. Billy Fischer, CHIEF COMPLAINT:  Lung mass   BRIEF  51 year old male with PMH as below, which is significant for COPD, seizure disorder, HTN, substance abuse, and TIA. Had a recent admission after a brief loss of consciousness and right sided weakness of non-specific etiology. TIA vs Todds paralysis. As part of the workup he had a CT of the chest, which demonstrated a large right sided lung mass. He was discharged with plans for pulmonary eval as an outpatient, but was lost to follow up. He was eventually able to follow up with Dr. [name] and was scheduled for EBUS on 2/20. When he arrived for the procedure he was found to be hypertensive and hypoxemic. Required 10L [place] to keep sats > 90%. He was taken to the ED for further workup and admission. PCCM asked to evaluate.   Pertinent  Medical History   has a past medical history of Asthma, Brain bleed ([place]), DVT (deep venous thrombosis) ([place]) (02/19/2015), GERD (gastroesophageal reflux disease), History of home oxygen therapy, Hypertension, Seizures ([place]), and Stroke ([place]).  Significant Hospital Events: Including procedures, antibiotic start and stop dates in addition to other pertinent events   02/02/2022 - admit 2/22 No acute events, oncology workup underway. Bp remains elevated   Interim History / Subjective:  Continues to remain resistant to bronchoscopy  Continues to report productive cough  Remains on 8-10L [place]   Objective   Blood pressure (!) 139/93, pulse 81, temperature 97.8 F (36.6 C), temperature source Axillary, resp. rate (!) 1, height 5\' 9"  (1.753 m), weight 91.4 kg, SpO2 90 %.        Intake/Output Summary (Last 24 hours) at 02/04/2022 0827 Last data filed at 02/04/2022 0130 Gross per 24 hour  Intake 100 ml  Output 950 ml  Net -850 ml    Filed Weights   02/02/22 1321 02/03/22 0150  Weight:  96.6 kg 91.4 kg    Examination: General: Acute on chronically ill appearing middle aged male sitting up in bed, in NAD HEENT: [place]/AT, MM pink/moist, PERRL,  Neuro: Alert and oriented x3, non-focal  CV: s1s2 regular rate and rhythm, no murmur, rubs, or gallops,  PULM:  Diminished to bases, no increased work of breathing, no added breath sounds, on 8-10L [place] GI: soft, bowel sounds active in all 4 quadrants, non-tender, non-distended, tolerating TF Extremities: warm/dry, no edema  Skin: no rashes or lesions  Resolved Hospital Problem list     Assessment & Plan:   Acute hypxoxemic resp failure - severe (POA) -Required needing 10L [place] on admit, PE ruled out , ? Due to mass v shunt Rt Lung mass (POA) - large 10cm with airway compression  COPD without acute exacerbation Postobstructive pneumonia  -CTA chest 2/20 revealed a new patchy infiltrates in the posteroinferior right lower lobe concerning for postobstructive pneumonia. Procal elevated at 1.13 P: Oncology consulted, appreciate assistance Continue aggressive pulmonary hygiene  Continue empiric ceftriaxone  Plan for radiation mapping today  Pending bronch once medically optimized, patient is resistant for bronch currently  Plan to obtain PET scan inpatient  Continue supplemental oxygen for sat goal > 92 ECHO pending to rule out shunt  Continue Anoro in place of home Stiolto  PRN BDs   Hypertensive crisis (POA) -BP 230/145  on admit  P: Continuous telemetry  Continue Clonidine and Cozaar May need third agent  ECHO pending   Sz disorder on dilantin P: Continue Keppra  Best Practice (right click and "Reselect all SmartList Selections" daily)   Diet/type: Regular consistency (see orders) - start diet  DVT prophylaxis: other waiting to see if he needs full AC GI prophylaxis: N/A Lines: N/A Foley:  N/A Code Status:  full code Last date of multidisciplinary goals of care discussion: Per primary   SIGNATURE:   Ebbie Sorenson D.  Kenton [place], NP-C [place] [facility] Personal contact information can be found on Amion  02/04/2022, 8:57 AM

## 2022-02-04 NOTE — Progress Notes (Signed)
Progress Note   Patient: Richard [name] ERX:[id number] DOB: 05-11-1971 DOA: 02/02/2022     2 DOS: the patient was seen and examined on 02/04/2022   Brief hospital course: 51 y.o. male with PMH significant for HTN, stroke, brain bleed, seizure, DVT, GERD, COPD, home oxygen requirement, substance abuse, recent hospitalization for brief loss of consciousness and right-sided weakness possibly from TIA versus Todd's paralysis; as part of work-up CT chest showed large right-sided lung mass for which she was supposed to follow-up with pulmonary as an outpatient but was lost to follow-up.  He eventually was scheduled for EBUS by PCCM on 02/02/2022. When he arrived for the procedure he was found to be hypertensive to over 950 systolic and hypoxemic.  He required 10L [place] to keep sats > 90%. He was taken to the ED for further workup and admission. Admitted to hospitalist service. PCCM consulted.  Assessment and Plan: Acute on chronic respiratory failure with hypoxia (HCC) - Posterior right lung mass. -Still requiring significant amount of oxygen.  Was on 15 L nonrebreather earlier but currently down to 10 L oxygen via high flow nasal cannula oxygen. -PE ruled out.  Mass of right lung - Per imaging, mass is large with airway compression -Pulmonary, oncology, radiation oncology following.  MRI of brain was negative for metastases. --Patient will need bronchoscopy for tissue diagnosis if patient agrees and once respiratory status improves slightly.  Radiation oncology plans to start him on radiation  Hypertensive urgency- (present on admission) - Initial blood pressure was elevated over 932 systolic.  Blood pressure improving but still intermittently on the higher side.  Continue clonidine and losartan along with as needed hydralazine.  COPD (chronic obstructive pulmonary disease) ([place])- (present on admission) - Continue current inhaled regimen  Seizure disorder ([place]) - Continue Keppra.  We will switch to  oral at the time of discharge.    Subjective:  Patient seen and examined at bedside.  Still short of breath with exertion.  No fever, vomiting, chest pain reported.  Physical Exam: Vitals:   02/04/22 0300 02/04/22 0400 02/04/22 0500 02/04/22 0800  BP: (!) 138/94 (!) 137/95 (!) 139/93   Pulse: 88 74 81   Resp: 18 (!) 27 (!) 1   Temp: 97.6 F (36.4 C)   97.8 F (36.6 C)  TempSrc: Oral   Axillary  SpO2: (!) 89% 90% 90%   Weight:      Height:       General: In mild distress secondary to dyspnea.  Currently on 10 L oxygen via high flow nasal cannula. ENT/neck: No elevated JVD.  No obvious masses  respiratory: Bilateral decreased breath sounds at bases with some scattered crackles with intermittent dyspnea CVS: S1-S2 heard, rate controlled Abdominal: Soft, nontender, nondistended, no organomegaly, bowel sounds heard Extremities: No cyanosis, clubbing; trace lower extremity edema present  CNS: Alert, awake and oriented.  No focal neurologic deficit.  Moving extremities. Lymph: No cervical lymphadenopathy Skin: No rashes, lesions, ulcers Psych: Affect is mostly flat.  Currently not agitated.   Musculoskeletal: No obvious joint deformity/tenderness/swelling   Data Reviewed: I have reviewed patient's investigations including blood work and imaging myself.  On today's labs, phosphorus of 2.9, BNP of 21.5, procalcitonin of 1.13, lactic acid of 1 and INR of 1   Family Communication: None at bedside  Disposition: Status is: Inpatient Remains inpatient appropriate because: Of severity of illness    Planned Discharge Destination: Home     Time spent: 50 minutes  Author: Aline August, MD 02/04/2022  9:23 AM  For on call review [email or web address].

## 2022-02-04 NOTE — TOC Initial Note (Addendum)
Transition of Care [facility]) - Initial/Assessment Note    Patient Details  Name: Richard [name] MRN: [id number] Date of Birth: 06-30-1971  [facility]) CM/SW Contact:    Dessa Phi, RN Phone Number: 02/04/2022, 10:39 AM  Clinical Narrative: Continue to monitor for d/c plans. Active w/[facility] home 02.                 Expected Discharge Plan: Home/Self Care Barriers to Discharge: Continued Medical Work up   Patient Goals and CMS Choice Patient states their goals for this hospitalization and ongoing recovery are:: Home CMS Medicare.gov Compare Post Acute Care list provided to:: Patient Choice offered to / list presented to : Patient  Expected Discharge Plan and Services Expected Discharge Plan: Home/Self Care   Discharge Planning Services: CM Consult Post Acute Care Choice: NA Living arrangements for the past 2 months: Single Family Home                                      Prior Living Arrangements/Services Living arrangements for the past 2 months: Single Family Home Lives with:: Spouse Patient language and need for interpreter reviewed:: Yes Do you feel safe going back to the place where you live?: Yes      Need for Family Participation in Patient Care: Yes (Comment) Care giver support system in place?: Yes (comment)   Criminal Activity/Legal Involvement Pertinent to Current Situation/Hospitalization: No - Comment as needed  Activities of Daily Living      Permission Sought/Granted Permission sought to share information with : Case Manager Permission granted to share information with : Yes, Verbal Permission Granted  Share Information with NAME: Case Manager           Emotional Assessment              Admission diagnosis:  Acute respiratory failure with hypoxemia ([place]) [J96.01] Patient Active Problem List   Diagnosis Date Noted   Counseling regarding advance care planning and goals of care    Malignant neoplasm of overlapping  sites of right lung ([place]) 02/03/2022   COPD (chronic obstructive pulmonary disease) ([place]) 01/28/2022   Right sided weakness 10/13/2021   Mass of right lung 04/09/2020   Alcohol abuse with intoxication ([place]) 03/04/2020   Hypoxia 03/04/2020   Swollen testicle    Atelectasis    Syncope 08/26/2015   Acute encephalopathy 08/26/2015   Seizure ([place])    Acute pulmonary edema ([place])    Lower leg DVT (deep venous thromboembolism), acute ([place])    Protein-calorie malnutrition, severe ([place]) 02/19/2015   Bacteremia due to Staphylococcus 02/17/2015   Alcohol withdrawal delirium ([place])    Acute on chronic respiratory failure with hypoxia ([place]) 02/13/2015   Altered mental status    Metabolic acidosis    Essential hypertension    Encephalopathy 02/11/2015   Alcohol intoxication ([place]) 02/11/2015   Lactic acidosis 02/11/2015   Bleeding from the nose 03/19/2014   TIA (transient ischemic attack) 12/24/2013   Weakness 12/23/2013   Left-sided weakness 12/23/2013   Malignant hypertension 12/23/2013   Seizure disorder ([place]) 12/23/2013   Vomiting 06/18/2013   Chest pain 06/18/2013   Hypertensive urgency 10/30/2012   Migraine variant 10/30/2012   TOBACCO ABUSE 01/11/2008   HYPERTENSION, BENIGN 11/24/2007   CHRONIC FRONTAL SINUSITIS 11/24/2007   Convulsions ([place]) 11/24/2007   Headache(784.0) 11/24/2007   PCP:  Patient, No Pcp Per (Inactive) Pharmacy:   [facility] #  [place], [place] [address] [place] [address] Phone: [phone] Fax: [phone]  [id number] [address], [place] [address] [place] [address] Phone: [phone] Fax: [phone]     Social Determinants of Health (SDOH) Interventions    Readmission Risk Interventions No flowsheet data found.

## 2022-02-04 NOTE — Progress Notes (Signed)
I called the patient's wife to let her know his MRI brain did not show any concerns for cancer but she did not answer and her voicemail is full.

## 2022-02-04 NOTE — Progress Notes (Signed)
Once patient returned to his room after MRI (head), oxygen demand increased.  Patient is now wearing a non-rebreather @ 15L.

## 2022-02-04 NOTE — Assessment & Plan Note (Addendum)
Postobstructive pneumonia - Per imaging, mass is large with airway compression -Pulmonary, oncology, radiation oncology following.  MRI of brain was negative for metastases. --Radiation oncology plans to start him on radiation -Continue Rocephin as per pulmonary -Plan for bronchoscopy by pulmonary today. -Palliative care following.  Currently remains full code.

## 2022-02-05 ENCOUNTER — Ambulatory Visit
Admit: 2022-02-05 | Discharge: 2022-02-05 | Disposition: A | Discharge status: Home / Self Care | Payer: Medicare HMO | Attending: Radiation Oncology | Admitting: Radiation Oncology

## 2022-02-05 DIAGNOSIS — R918 Other nonspecific abnormal finding of lung field: Secondary | ICD-10-CM | POA: Diagnosis not present

## 2022-02-05 DIAGNOSIS — G40909 Epilepsy, unspecified, not intractable, without status epilepticus: Secondary | ICD-10-CM | POA: Diagnosis not present

## 2022-02-05 DIAGNOSIS — J9621 Acute and chronic respiratory failure with hypoxia: Secondary | ICD-10-CM | POA: Diagnosis not present

## 2022-02-05 DIAGNOSIS — I160 Hypertensive urgency: Secondary | ICD-10-CM | POA: Diagnosis not present

## 2022-02-05 LAB — GLUCOSE, CAPILLARY
Glucose-Capillary: 104 mg/dL — ABNORMAL HIGH (ref 70–99)
Glucose-Capillary: 111 mg/dL — ABNORMAL HIGH (ref 70–99)
Glucose-Capillary: 115 mg/dL — ABNORMAL HIGH (ref 70–99)
Glucose-Capillary: 121 mg/dL — ABNORMAL HIGH (ref 70–99)

## 2022-02-05 LAB — PROCALCITONIN: Procalcitonin: 1.15 ng/mL

## 2022-02-05 MED ORDER — LOSARTAN POTASSIUM 50 MG PO TABS
50.0000 mg | ORAL_TABLET | Freq: Every day | ORAL | Status: DC
Start: 2022-02-05 — End: 2022-02-11
  Administered 2022-02-05 – 2022-02-11 (×7): 50 mg via ORAL
  Filled 2022-02-05 (×7): qty 1

## 2022-02-05 MED ORDER — HYDROCHLOROTHIAZIDE 25 MG PO TABS
25.0000 mg | ORAL_TABLET | Freq: Two times a day (BID) | ORAL | Status: DC
  Administered 2022-02-05 – 2022-02-11 (×12): 25 mg via ORAL
  Filled 2022-02-05 (×12): qty 1

## 2022-02-05 MED ORDER — PREDNISONE 20 MG PO TABS
40.0000 mg | ORAL_TABLET | Freq: Every day | ORAL | Status: AC
  Administered 2022-02-06 – 2022-02-10 (×5): 40 mg via ORAL
  Filled 2022-02-05 (×5): qty 2

## 2022-02-05 MED ORDER — HYDROCHLOROTHIAZIDE 12.5 MG PO TABS
12.5000 mg | ORAL_TABLET | Freq: Two times a day (BID) | ORAL | Status: DC
  Administered 2022-02-05: 12.5 mg via ORAL
  Filled 2022-02-05: qty 1

## 2022-02-05 NOTE — Progress Notes (Addendum)
NAME:  Richard [name], MRN:  [id number], DOB:  1971-02-12, LOS: 3 ADMISSION DATE:  02/02/2022, CONSULTATION DATE:  2/20 REFERRING MD:  Dr. Billy [name], CHIEF COMPLAINT:  Lung mass   BRIEF  51 year old male with PMH as below, which is significant for COPD, seizure disorder, HTN, substance abuse, and TIA. Had a recent admission after a brief loss of consciousness and right sided weakness of non-specific etiology. TIA vs Richard [name] paralysis. As part of the workup he had a CT of the chest, which demonstrated a large right sided lung mass. He was discharged with plans for pulmonary eval as an outpatient, but was lost to follow up. He was eventually able to follow up with Dr. Lamonte [name] and was scheduled for EBUS on 2/20. When he arrived for the procedure he was found to be hypertensive and hypoxemic. Required 10L [place] to keep sats > 90%. He was taken to the ED for further workup and admission. PCCM asked to evaluate.   Pertinent  Medical History   has a past medical history of Asthma, Brain bleed (Richard [name]), DVT (deep venous thrombosis) (Richard [name]) (02/19/2015), GERD (gastroesophageal reflux disease), History of home oxygen therapy, Hypertension, Seizures (Richard [name]), and Stroke (Richard [name]).  Significant Hospital Events: Including procedures, antibiotic start and stop dates in addition to other pertinent events   02/02/2022 - admit 2/21 MRI brain negative  2/22 No acute events, oncology workup underway. Bp remains elevated. PET scan completed   Interim History / Subjective:  No acute events, states he feels well   Objective   Blood pressure (!) 149/104, pulse 77, temperature 97.9 F (36.6 C), temperature source Oral, resp. rate 19, height 5\' 9"  (1.753 m), weight 91.4 kg, SpO2 96 %.        Intake/Output Summary (Last 24 hours) at 02/05/2022 0809 Last data filed at 02/05/2022 0600 Gross per 24 hour  Intake 865.67 ml  Output 425 ml  Net 440.67 ml    Filed Weights   02/02/22 1321 02/03/22 0150  Weight: 96.6 kg 91.4 kg     Examination: General: Well-appearing middle-age male sitting up in bedside recliner in no acute distress HEENT: Richard [name]/AT, MM pink/moist, PERRL,  Neuro: Alert and oriented x3, nonfocal CV: s1s2 regular rate and rhythm, no murmur, rubs, or gallops,  PULM: Clear to auscultation bilaterally, slightly diminished right base, no increased work of breathing, no added breath sounds, oxygen saturations 96 to 100% on 6 L nasal cannula GI: soft, bowel sounds active in all 4 quadrants, non-tender, non-distended, tolerating oral diet Extremities: warm/dry, no edema  Skin: no rashes or lesions  Resolved Hospital Problem list     Assessment & Plan:   Acute hypxoxemic resp failure - severe (POA) -Required needing 10L [place] on admit, PE ruled out , ? Due to mass v shunt Rt Lung mass (POA) - large 10cm with airway compression  COPD without acute exacerbation Postobstructive pneumonia  -CTA chest 2/20 revealed a new patchy infiltrates in the posteroinferior right lower lobe concerning for postobstructive pneumonia. Procal elevated at 1.13 P: Oncology following, appreciate assistance Continue to encourage aggressive pulmonary hygiene Empiric antibiotics PET scan with no other obvious masses or lesions, will likely need bronchoscopy for tissue sampling Continue supplemental oxygen for sat goal greater than 92  Continue bronchodilators  Hypertensive crisis (POA) -BP 230/145  on admit  -Echocardiogram 2/22 revealed EF 60 to 65% with grade 1 diastolic dysfunction. Negative bubble study  P: Continuous telemetry Continue clonidine  Increase dose of Cozaar Add HCTZ Echocardiogram  Sz  disorder on dilantin P: Continue Keppra   Best Practice (right click and "Reselect all SmartList Selections" daily)   Diet/type: Regular consistency (see orders) - start diet  DVT prophylaxis: other waiting to see if he needs full AC GI prophylaxis: N/A Lines: N/A Foley:  N/A Code Status:  full code Last date of  multidisciplinary goals of care discussion: Per primary   SIGNATURE:   Richard D. Richard Kingfisher, NP-C [facility] Personal contact information can be found on Amion  02/05/2022, 8:09 AM   Pulmonary critical care attending:  This is a 51 year old gentleman, history of COPD seizure disorder, substance abuse.  Has had a mass in the chest and failed to follow-up multiple times.  He has fallen to the cracks for return on being able to get him scheduled for biopsy of this mass.  He was admitted this time with exacerbation of COPD, likely postobstructive pneumonia and requirement for oxygen  BP (!) 147/90 (BP Location: Left Arm)    Pulse 86    Temp 97.9 F (36.6 C) (Oral)    Resp 20    Ht 5\' 9"  (1.753 m)    Wt 91.4 kg    SpO2 92%    BMI 29.76 kg/m   General: Male resting comfortably in bed, on oxygen feeling better he says HEENT: Tracking appropriately, NCAT Heart: Regular rhythm S1-S2 Lungs: Clear to auscultation on the left, right-sided rhonchi, diminished slightly Abdomen: Soft nontender  Labs: Reviewed PET scan: Reviewed hypermetabolic lesion within the right upper lobe concerning for malignancy. The patient's images have been independently reviewed by me.    Assessment: Acute hypoxemic respiratory failure secondary to COPD exacerbation and postobstructive pneumonia, slowly improving now on less oxygen also less hypertensive Hypertensive urgency, better controlled  Plan: I believe that he is stable from a blood pressure management and O2 requirement for bronchoscopy. I discussed that with him today. We will have him n.p.o. at midnight Added some prednisone to optimize respiratory status prior to bronchoscopy. Continue inhaler Continue albuterol as needed Plan for bronchoscopy tomorrow inpatient at [facility] long with Dr. Elsworth [name]. Orders placed   Richard Nash, DO [facility] 02/05/2022 11:19 AM

## 2022-02-05 NOTE — Progress Notes (Signed)
eLink Physician-Brief Progress Note Patient Name: Jamiere Gulas DOB: Aug 14, 1971 MRN: [id number]   Date of Service  02/05/2022  HPI/Events of Note  Pt has chronic COPD and sats usually run in upper 80's RN asks you to change Oxygen therapy order to keep 02 saturation greater than 88% Currently the order is for 92%  eICU Interventions  Modified. Prn ABG if worsening resp failure.      Intervention Category Intermediate Interventions: Respiratory distress - evaluation and management  Elmer Sow 02/05/2022, 9:53 PM

## 2022-02-05 NOTE — Progress Notes (Signed)
°  Progress Note   Patient: Richard [name] HER:[id number] DOB: September 19, 1971 DOA: 02/02/2022     3 DOS: the patient was seen and examined on 02/05/2022   Brief hospital course: 51 y.o. male with PMH significant for HTN, stroke, brain bleed, seizure, DVT, GERD, COPD, home oxygen requirement, substance abuse, recent hospitalization for brief loss of consciousness and right-sided weakness possibly from TIA versus Todd's paralysis; as part of work-up CT chest showed large right-sided lung mass for which she was supposed to follow-up with pulmonary as an outpatient but was lost to follow-up.  He eventually was scheduled for EBUS by PCCM on 02/02/2022. When he arrived for the procedure he was found to be hypertensive to over 856 systolic and hypoxemic.  He required 10L [place] to keep sats > 90%. He was taken to the ED for further workup and admission. Admitted to hospitalist service. PCCM consulted.  Assessment and Plan: Acute on chronic respiratory failure with hypoxia (HCC) -Still requiring significant amount of oxygen currently on 13 L oxygen via high flow nasal cannula.  Wean off as able. -PE ruled out.  Mass of right lung - Per imaging, mass is large with airway compression -Pulmonary, oncology, radiation oncology following.  MRI of brain was negative for metastases. --Patient will need bronchoscopy for tissue diagnosis if patient agrees and once respiratory status improves slightly.  Radiation oncology plans to start him on radiation  Hypertensive urgency- (present on admission) - Initial blood pressure was elevated over 314 systolic.  Blood pressure improving but still intermittently on the higher side.  Continue clonidine along with as needed hydralazine.  Increase losartan to 50 mg daily.  COPD (chronic obstructive pulmonary disease) ([place])- (present on admission) - Continue current inhaled regimen  Seizure disorder ([place]) - Continue Keppra.  We will switch to oral at the time of  discharge.   Subjective:  Patient seen and examined at bedside.  Still short of breath with exertion.  Denies any chest pain, fever or vomiting.  Physical Exam: Vitals:   02/05/22 0400 02/05/22 0500 02/05/22 0600 02/05/22 0700  BP: 136/86 (!) 159/111 (!) 143/97 (!) 149/104  Pulse: 86 72 73 77  Resp: (!) 27 (!) 22 (!) 25 19  Temp: (!) 97.3 F (36.3 C)   97.9 F (36.6 C)  TempSrc: Oral   Oral  SpO2: 100% 100% 100% 96%  Weight:      Height:       General: No acute distress, currently on 30 L oxygen via high flow nasal cannula  ENT/neck: No palpable thyromegaly.  JVD is not elevated.   Respiratory: Decreased breath sounds at bases bilaterally with some crackles and tachypnea  CVS: Currently rate controlled; S1-S2 heard  abdominal: Soft, nontender, distended slightly; no organomegaly, normal bowel sounds are heard  extremities: Mild lower extremity edema present; no clubbing  CNS: Alert and alert.   No focal neurologic deficit.  Moving extremities. Lymph: No palpable lymphadenopathy  skin: No obvious ecchymosis/lesions  psych: Mostly flat affect.  No signs of agitation.   Musculoskeletal: No obvious joint swelling or tenderness  Data Reviewed: Labs reviewed by myself.  No new labs today. Echo showed EF 65 to 70% with grade 1 diastolic dysfunction  Family Communication: None at bedside Disposition: Status is: Inpatient Remains inpatient appropriate because: Of severity of illness     Planned Discharge Destination: Home     Time spent: 50 minutes  Author: Aline August, MD 02/05/2022 7:26 AM  For on call review [email or web address].

## 2022-02-05 NOTE — Progress Notes (Signed)
Chaplain engaged in an initial visit with Richard [name].  Chaplain went over Scientist, physiological, Healthcare POA.  Because Richard [name] desires to assign his wife as his healthcare agent, Chaplain voiced that because they are married that already have a legal agreement in place that medical staff has to follow.  Richard [name]'s wife is already able to make medical decisions for him if he cannot do them for himself.  Richard [name] also desires for his son to be apart of decision making.  Richard [name]'s wife and son are on the same page concerning his care.    Richard [name] also shared about being a proud grandparent.  He is normally spending time with his grandchildren.  Richard [name] has found it hard to be hospitalized and immobile.  Richard [name] loves being active and working.   Chaplain offered listening, presence, and support.    02/05/22 1200  Clinical Encounter Type  Visited With Patient;Patient and family together  Visit Type Initial;Social support  Spiritual Encounters  Spiritual Needs Literature;Brochure

## 2022-02-06 ENCOUNTER — Encounter (HOSPITAL_COMMUNITY)
Admission: RE | Disposition: A | Discharge status: Home / Self Care | Payer: Self-pay | Source: Ambulatory Visit | Attending: Internal Medicine

## 2022-02-06 ENCOUNTER — Ambulatory Visit
Admit: 2022-02-06 | Discharge: 2022-02-06 | Disposition: A | Discharge status: Home / Self Care | Payer: Medicare HMO | Attending: Radiation Oncology | Admitting: Radiation Oncology

## 2022-02-06 ENCOUNTER — Encounter (HOSPITAL_COMMUNITY): Payer: Self-pay | Admitting: Anesthesiology

## 2022-02-06 DIAGNOSIS — J189 Pneumonia, unspecified organism: Secondary | ICD-10-CM | POA: Diagnosis not present

## 2022-02-06 DIAGNOSIS — G40909 Epilepsy, unspecified, not intractable, without status epilepticus: Secondary | ICD-10-CM | POA: Diagnosis not present

## 2022-02-06 DIAGNOSIS — J9621 Acute and chronic respiratory failure with hypoxia: Principal | ICD-10-CM

## 2022-02-06 DIAGNOSIS — I160 Hypertensive urgency: Secondary | ICD-10-CM | POA: Diagnosis not present

## 2022-02-06 DIAGNOSIS — R918 Other nonspecific abnormal finding of lung field: Secondary | ICD-10-CM | POA: Diagnosis not present

## 2022-02-06 LAB — BASIC METABOLIC PANEL
Anion gap: 5 (ref 5–15)
BUN: 11 mg/dL (ref 6–20)
CO2: 29 mmol/L (ref 22–32)
Calcium: 8.8 mg/dL — ABNORMAL LOW (ref 8.9–10.3)
Chloride: 101 mmol/L (ref 98–111)
Creatinine, Ser: 0.72 mg/dL (ref 0.61–1.24)
GFR, Estimated: >60 mL/min (ref >60)
Glucose, Bld: 112 mg/dL — ABNORMAL HIGH (ref 70–99)
Potassium: 3.9 mmol/L (ref 3.5–5.1)
Sodium: 135 mmol/L (ref 135–145)

## 2022-02-06 LAB — CBC
HCT: 45.8 % (ref 39.0–52.0)
Hemoglobin: 14.7 g/dL (ref 13.0–17.0)
MCH: 30.1 pg (ref 26.0–34.0)
MCHC: 32.1 g/dL (ref 30.0–36.0)
MCV: 93.7 fL (ref 80.0–100.0)
Platelets: 318 10*3/uL (ref 150–400)
RBC: 4.89 MIL/uL (ref 4.22–5.81)
RDW: 15.4 % (ref 11.5–15.5)
WBC: 9.3 10*3/uL (ref 4.0–10.5)
nRBC: 0 % (ref 0.0–0.2)

## 2022-02-06 LAB — GLUCOSE, CAPILLARY
Glucose-Capillary: 137 mg/dL — ABNORMAL HIGH (ref 70–99)
Glucose-Capillary: 186 mg/dL — ABNORMAL HIGH (ref 70–99)
Glucose-Capillary: 98 mg/dL (ref 70–99)

## 2022-02-06 SURGERY — CANCELLED PROCEDURE

## 2022-02-06 MED ORDER — AYR SALINE NASAL NA GEL
1.0000 "application " | Freq: Two times a day (BID) | NASAL | Status: DC
  Administered 2022-02-06 – 2022-02-11 (×10): 1 via NASAL
  Filled 2022-02-06: qty 14.1

## 2022-02-06 NOTE — Progress Notes (Signed)
Oncology Short note  Patient was seen in oncologic follow-up.  He had EBUS with biopsy scheduled today but declined this. He is still needing a decent amount of oxygen.  He was concerned about needing to be on a ventilator after the procedure. We discussed that this is a risk but they will try to keep him off the ventilator. Not performing a biopsy or getting a definitive diagnosis will limit his treatment options to just palliative radiation at this time. We discussed that we could either take an approach to get a tissue diagnosis and define further treatments for his likely lung cancer versus declining the biopsy and taking the best supportive care approach through hospice. His wife is at bedside during this discussion. He notes that he is " not ready to die yet" would like to talk about the procedure with the pulmonologist again.  Oncology will follow-up after biopsy results available.  Sullivan Lone MD MS Hematology/Oncology Physician [facility]

## 2022-02-06 NOTE — Anesthesia Preprocedure Evaluation (Deleted)
Anesthesia Evaluation    Airway        Dental   Pulmonary Current Smoker,           Cardiovascular hypertension,      Neuro/Psych    GI/Hepatic   Endo/Other    Renal/GU      Musculoskeletal   Abdominal   Peds  Hematology   Anesthesia Other Findings   Reproductive/Obstetrics                             Anesthesia Physical Anesthesia Plan Anesthesia Quick Evaluation  

## 2022-02-06 NOTE — Progress Notes (Signed)
NAME:  Richard [name], MRN:  [id number], DOB:  [birth year], LOS: 4 ADMISSION DATE:  02/02/2022, CONSULTATION DATE:  2/20 REFERRING MD:  Dr. Billy [name], CHIEF COMPLAINT:  Lung mass   BRIEF  51 year old male with PMH as below, which is significant for COPD, seizure disorder, HTN, substance abuse, and TIA. Had a recent admission after a brief loss of consciousness and right sided weakness of non-specific etiology. TIA vs Todds paralysis. As part of the workup he had a CT of the chest, which demonstrated a large right sided lung mass. He was discharged with plans for pulmonary eval as an outpatient, but was lost to follow up. He was eventually able to follow up with Dr. Lamonte [name] and was scheduled for EBUS on 2/20. When he arrived for the procedure he was found to be hypertensive and hypoxemic. Required 10L [place] to keep sats > 90%. He was taken to the ED for further workup and admission. PCCM asked to evaluate.   Pertinent  Medical History   has a past medical history of Asthma, Brain bleed ([place]), DVT (deep venous thrombosis) ([place]) (02/19/2015), GERD (gastroesophageal reflux disease), History of home oxygen therapy, Hypertension, Seizures ([place]), and Stroke ([place]).  Significant Hospital Events: Including procedures, antibiotic start and stop dates in addition to other pertinent events   02/02/2022 - admit 2/21 MRI brain negative  2/22 No acute events, oncology workup underway. Bp remains elevated. PET scan completed  2/22 PET scan hypermetabolic right upper lobe mass, adjacent right upper lobe nodule, ipsilateral mediastinal/hilar/subcarinal hypermetabolic lymph nodes, volume loss in right middle and right lower lobes  Interim History / Subjective:   Afebrile. Sitting out of bed in a chair. On 12 L high flow nasal cannula increased from 6 L yesterday  Objective   Blood pressure (!) 155/106, pulse 98, temperature 98.1 F (36.7 C), temperature source Oral, resp. rate 15, height 5\' 9"  (1.753 m), weight 91.4  kg, SpO2 92 %.        Intake/Output Summary (Last 24 hours) at 02/06/2022 1308 Last data filed at 02/06/2022 1015 Gross per 24 hour  Intake 1123.57 ml  Output 2350 ml  Net -1226.43 ml    Filed Weights   02/02/22 1321 02/03/22 0150  Weight: 96.6 kg 91.4 kg    Examination: General: Well-appearing middle-age male sitting up in bedside recliner in no acute distress, saturation 91% on 12 L HEENT: [place]/AT, MM pink/moist, PERRL,  Neuro: Alert and oriented x3, nonfocal CV: S1-S2 regular PULM: Decreased breath sounds on right, no accessory muscle use, faint rhonchi GI: soft, bowel sounds active in all 4 quadrants, non-tender, non-distended, tolerating oral diet Extremities: warm/dry, no edema  Skin: no rashes or lesions  Labs show normal electrolytes, no leukocytosis  Resolved Hospital Problem list     Assessment & Plan:   Acute hypxoxemic resp failure - severe (POA) -Required needing 10L [place] on admit, PE ruled out , ? Due to postobstructive pneumonia COPD without acute exacerbation  -Continue supplemental oxygen for sat goal greater than 92 , he is requiring higher oxygen today Continue bronchodilators -Prednisone 40 mg daily   Rt Lung mass (POA) - large 10cm with airway compression  Postobstructive pneumonia  -CTA chest 2/20 revealed a new patchy infiltrates in the posteroinferior right lower lobe concerning for postobstructive pneumonia. [place]cal elevated at 1.13 P: Oncology following, appreciate assistance Continue to encourage aggressive pulmonary hygiene Empiric ceftriaxone He needs bronchoscopy for tissue diagnosis    Hypertensive crisis (POA) -BP 230/145  on admit  -  Echocardiogram 2/22 revealed EF 60 to 65% with grade 1 diastolic dysfunction. Negative bubble study  P: Continuous telemetry Continue clonidine  Increase dose of Cozaar Add HCTZ Continue 50 mg of losartan  Sz disorder on dilantin P: Continue Keppra   Discussion: I spent 45 minutes discussing  risks and benefits of bronchoscopy procedure with him.  I explained to him advanced nature of his malignancy with lymphadenopathy indicating stage IIIb and satellite nodule indicating likely stage IV lung cancer.  I explained to him that bronchoscopy was a diagnostic procedure for tissue diagnosis which would enable the oncologist to treat him better with chemotherapy in addition to radiation.  I explained risks of procedure including that of bleeding, worsening hypoxia since he is already on 2 L of oxygen and possibility of mechanical ventilation for longer duration after the procedure. Richard [name] seemed very suspicious and confrontational throughout my multiple conversations, witnessed by RN Richard [name] and charge nurse Richard [name].  He kept insisting that different providers were telling him different things, was suspicious of my motives "you are trying to take my money" and was not accepting of the risks of the procedure.  I called and spoke to his wife after several attempts to reach her and explained to her that I could not proceed with the procedure since he was not excepting of the possible risks and complications. Unfortunately, lung cancer appears advanced and his prognosis seems to be very poor.   Best Practice (right click and "Reselect all SmartList Selections" daily)   Diet/type: Regular consistency (see orders) - start diet  DVT prophylaxis: other waiting to see if he needs full AC GI prophylaxis: N/A Lines: N/A Foley:  N/A Code Status:  full code Last date of multidisciplinary goals of care discussion: Per primary   SIGNATURE:    Richard Mead MD. FCCP. [facility] Pager : [phone]  If no response to pager , please call [phone] until 7 pm After 7:00 pm call Elink  [phone]    02/06/2022, 1:08 PM

## 2022-02-06 NOTE — Progress Notes (Signed)
°  Progress Note   Patient: Richard [name] LXB:[id number] DOB: 11/08/1971 DOA: 02/02/2022     4 DOS: the patient was seen and examined on 02/06/2022   Brief hospital course: 51 y.o. male with PMH significant for HTN, stroke, brain bleed, seizure, DVT, GERD, COPD, home oxygen requirement, substance abuse, recent hospitalization for brief loss of consciousness and right-sided weakness possibly from TIA versus Todd's paralysis; as part of work-up CT chest showed large right-sided lung mass for which she was supposed to follow-up with pulmonary as an outpatient but was lost to follow-up.  He eventually was scheduled for EBUS by PCCM on 02/02/2022. When he arrived for the procedure he was found to be hypertensive to over 416 systolic and hypoxemic.  He required 10L [place] to keep sats > 90%. He was taken to the ED for further workup and admission. Admitted to hospitalist service. PCCM consulted.  Assessment and Plan: Acute on chronic respiratory failure with hypoxia (HCC) -currently on 8 L oxygen via high flow nasal cannula.  Wean off as able. -PE ruled out.  Mass of right lung - Per imaging, mass is large with airway compression -Pulmonary, oncology, radiation oncology following.  MRI of brain was negative for metastases. --Radiation oncology plans to start him on radiation -plans for bronchoscopy by pulmonary today  Hypertensive urgency- (present on admission) - Initial blood pressure was elevated over 384 systolic.  Blood pressure improving. Continue clonidine, losartan and hydrochlorthiazide  COPD (chronic obstructive pulmonary disease) ([place])- (present on admission) - Continue current inhaled regimen  Seizure disorder ([place]) - Continue Keppra.  We will switch to oral at the time of discharge.   Subjective:  -Patient seen and examined at bedside.  No seizures, fever, vomiting reported.  Still extremely short of breath with minimal exertion.  Physical Exam: Vitals:   02/06/22 0630 02/06/22 0635  02/06/22 0700 02/06/22 0713  BP:   (!) 157/97   Pulse:  98 (!) 104   Resp:  17 16   Temp:    98.3 F (36.8 C)  TempSrc:    Oral  SpO2: 91% 95% (!) 88%   Weight:      Height:       General: On 8 L high flow nasal cannula oxygen.  No distress. ENT/neck: No elevated JVD.  No thyromegaly.   Respiratory: Bilateral decreased breath sounds at bases with some crackles  CVS: S1-S2 heard, intermittently tachycardic Abdominal: Soft, nontender, mildly distended; no organomegaly, bowel sounds heard Extremities: Trace lower extremity edema present; no cyanosis CNS: Awake and alert. Slow to respond; poor historian.  No focal neurologic deficit.  Moves extremities. Lymph: No cervical lymphadenopathy Skin: No obvious petechiae/rashes  psych: Currently not agitated.  Flat affect. Musculoskeletal: No obvious joint deformity/swelling  Data Reviewed: I have reviewed patient's investigation during this hospitalization so far myself.  Today's sodium is 135, potassium of 3.9, creatinine of 0.72, WBC of 9.3  Family Communication: None at bedside  Disposition: Status is: Inpatient Remains inpatient appropriate because: Of severity of illness     Planned Discharge Destination: Home     Time spent: 50 minutes  Author: Aline August, MD 02/06/2022 7:16 AM  For on call review [email or web address].

## 2022-02-06 NOTE — Progress Notes (Signed)
Pt was scheduled for Bronchoscopy/EBUS today with Dr Elsworth Soho. Due to pt's ongoing respiratory issues, procedure cancelled today per MDA Lissa Hoard and MD Elsworth Soho.

## 2022-02-07 DIAGNOSIS — Z7189 Other specified counseling: Secondary | ICD-10-CM | POA: Diagnosis not present

## 2022-02-07 DIAGNOSIS — Z515 Encounter for palliative care: Secondary | ICD-10-CM

## 2022-02-07 DIAGNOSIS — J9621 Acute and chronic respiratory failure with hypoxia: Secondary | ICD-10-CM | POA: Diagnosis not present

## 2022-02-07 DIAGNOSIS — R918 Other nonspecific abnormal finding of lung field: Secondary | ICD-10-CM | POA: Diagnosis not present

## 2022-02-07 DIAGNOSIS — J449 Chronic obstructive pulmonary disease, unspecified: Secondary | ICD-10-CM | POA: Diagnosis not present

## 2022-02-07 DIAGNOSIS — I160 Hypertensive urgency: Secondary | ICD-10-CM | POA: Diagnosis not present

## 2022-02-07 LAB — GLUCOSE, CAPILLARY
Glucose-Capillary: 120 mg/dL — ABNORMAL HIGH (ref 70–99)
Glucose-Capillary: 140 mg/dL — ABNORMAL HIGH (ref 70–99)

## 2022-02-07 NOTE — Progress Notes (Signed)
Attempted to reach patient's spouse over the phone, but phone number went straight to voicemail with an indication that the voice mailbox has not been set up. Will ask patient to get in touch with his wife to set up a time for her to come to meet with Dr. Elsworth Soho.

## 2022-02-07 NOTE — Progress Notes (Signed)
Progress Note   Patient: Richard [name] [id number]:[id number] DOB: [birth year] DOA: 02/02/2022     5 DOS: the patient was seen and examined on 02/07/2022   Brief hospital course: 51 y.o. male with PMH significant for HTN, stroke, brain bleed, seizure, DVT, GERD, COPD, home oxygen requirement, substance abuse, recent hospitalization for brief loss of consciousness and right-sided weakness possibly from TIA versus Todd's paralysis; as part of work-up CT chest showed large right-sided lung mass for which she was supposed to follow-up with pulmonary as an outpatient but was lost to follow-up.  He eventually was scheduled for EBUS by PCCM on 02/02/2022. When he arrived for the procedure he was found to be hypertensive to over 093 systolic and hypoxemic.  He required 10L [place] to keep sats > 90%. He was taken to the ED for further workup and admission. Admitted to hospitalist service. PCCM consulted.  Oncology/radiation oncology were also consulted.  He was scheduled to have bronchoscopy on 02/06/2022 but he refused it.  Subsequently palliative care was consulted.  Assessment and Plan: Acute on chronic respiratory failure with hypoxia (HCC) -currently on 13L oxygen via high flow nasal cannula.  Wean off as able. -PE ruled out.  Mass of right lung Postobstructive pneumonia - Per imaging, mass is large with airway compression -Pulmonary, oncology, radiation oncology following.  MRI of brain was negative for metastases. --Radiation oncology plans to start him on radiation -He was scheduled to have bronchoscopy on 02/06/2022 but he refused it.  Subsequently palliative care was consulted. -Continue Rocephin as per pulmonary  Hypertensive urgency- (present on admission) - Initial blood pressure was elevated over 818 systolic.  Blood pressure improving. Continue clonidine, losartan and hydrochlorthiazide  COPD (chronic obstructive pulmonary disease) ([place])- (present on admission) - Continue current inhaled  regimen -Continue prednisone.  Seizure disorder ([place]) - Continue Keppra.  We will switch to oral at the time of discharge.   Subjective:  Patient seen and examined at bedside.  Poor historian.  Still short of breath with exertion.  No chest pain, fever or vomiting reported.  He states that he wants to proceed with bronchoscopy.  Physical Exam: Vitals:   02/07/22 0400 02/07/22 0500 02/07/22 0600 02/07/22 0700  BP: (!) 138/92 (!) 147/97 (!) 149/100 140/89  Pulse:      Resp: 20 16 (!) 28 20  Temp: 98.3 F (36.8 C)     TempSrc: Oral     SpO2:      Weight:      Height:       General: No acute distress, currently on 13 L oxygen by high flow nasal cannula  ENT/neck:  No obvious masses.  No JVD elevation respiratory: Decreased breath sounds at bases bilaterally with scattered crackles.  Intermittently tachypneic CVS: Rate controlled currently; S1-S2 heard  abdominal: Soft, nontender, distended slightly; no organomegaly, normal bowel sounds are heard Extremities: Mild lower extremity edema present; no clubbing CNS: Alert; slow to respond.  Poor historian.  No focal neurologic deficit.  Moves extremities  lymph: No obvious lymphadenopathy  skin: No ecchymosis/lesions Psych: Affect is extremely flat.  No agitation currently.   Musculoskeletal: No obvious joint swelling/tenderness  Data Reviewed: I have reviewed patient's investigation myself.  No new labs today.  Family Communication: None at bedside  Disposition: Status is: Inpatient Remains inpatient appropriate because: Of severity of illness and patient still requiring significant amount of oxygen.    Planned Discharge Destination: Home     Time spent: 50 minutes  Author: Aline August, MD  02/07/2022 7:20 AM  For on call review [email or web address].

## 2022-02-07 NOTE — Progress Notes (Signed)
Patient's wife was on the phone with the patient and indicated that she will be coming today and can come tomorrow at 11 am.

## 2022-02-07 NOTE — Plan of Care (Signed)
°  Problem: Activity: Goal: Risk for activity intolerance will decrease Outcome: Progressing   Problem: Nutrition: Goal: Adequate nutrition will be maintained Outcome: Progressing   Problem: Elimination: Goal: Will not experience complications related to bowel motility Outcome: Progressing Goal: Will not experience complications related to urinary retention Outcome: Progressing   Problem: Pain Managment: Goal: General experience of comfort will improve Outcome: Progressing   Problem: Safety: Goal: Ability to remain free from injury will improve Outcome: Progressing   Problem: Health Behavior/Discharge Planning: Goal: Ability to manage health-related needs will improve Outcome: Progressing   Problem: Clinical Measurements: Goal: Ability to maintain clinical measurements within normal limits will improve Outcome: Progressing Goal: Will remain free from infection Outcome: Progressing Goal: Diagnostic test results will improve Outcome: Progressing Goal: Respiratory complications will improve Outcome: Progressing Goal: Cardiovascular complication will be avoided Outcome: Progressing   Problem: Activity: Goal: Risk for activity intolerance will decrease Outcome: Progressing   Problem: Nutrition: Goal: Adequate nutrition will be maintained Outcome: Progressing   Problem: Coping: Goal: Level of anxiety will decrease Outcome: Progressing   Problem: Elimination: Goal: Will not experience complications related to bowel motility Outcome: Progressing Goal: Will not experience complications related to urinary retention Outcome: Progressing   Problem: Pain Managment: Goal: General experience of comfort will improve Outcome: Progressing   Problem: Safety: Goal: Ability to remain free from injury will improve Outcome: Progressing   Problem: Skin Integrity: Goal: Risk for impaired skin integrity will decrease Outcome: Progressing

## 2022-02-08 DIAGNOSIS — I160 Hypertensive urgency: Secondary | ICD-10-CM | POA: Diagnosis not present

## 2022-02-08 DIAGNOSIS — J189 Pneumonia, unspecified organism: Secondary | ICD-10-CM | POA: Diagnosis not present

## 2022-02-08 DIAGNOSIS — R918 Other nonspecific abnormal finding of lung field: Secondary | ICD-10-CM | POA: Diagnosis not present

## 2022-02-08 DIAGNOSIS — J449 Chronic obstructive pulmonary disease, unspecified: Secondary | ICD-10-CM | POA: Diagnosis not present

## 2022-02-08 DIAGNOSIS — J9621 Acute and chronic respiratory failure with hypoxia: Secondary | ICD-10-CM | POA: Diagnosis not present

## 2022-02-08 LAB — GLUCOSE, CAPILLARY
Glucose-Capillary: 102 mg/dL — ABNORMAL HIGH (ref 70–99)
Glucose-Capillary: 129 mg/dL — ABNORMAL HIGH (ref 70–99)
Glucose-Capillary: 129 mg/dL — ABNORMAL HIGH (ref 70–99)
Glucose-Capillary: 139 mg/dL — ABNORMAL HIGH (ref 70–99)

## 2022-02-08 NOTE — Progress Notes (Signed)
Progress Note   Patient: Richard [name] [id number]:[id number] DOB: November 28, 1971 DOA: 02/02/2022     6 DOS: the patient was seen and examined on 02/08/2022   Brief hospital course: 51 y.o. male with PMH significant for HTN, stroke, brain bleed, seizure, DVT, GERD, COPD, home oxygen requirement, substance abuse, recent hospitalization for brief loss of consciousness and right-sided weakness possibly from TIA versus Todd's paralysis; as part of work-up CT chest showed large right-sided lung mass for which she was supposed to follow-up with pulmonary as an outpatient but was lost to follow-up.  He eventually was scheduled for EBUS by PCCM on 02/02/2022. When he arrived for the procedure he was found to be hypertensive to over 678 systolic and hypoxemic.  He required 10L [place] to keep sats > 90%. He was taken to the ED for further workup and admission. Admitted to hospitalist service. PCCM consulted.  Oncology/radiation oncology were also consulted.  He was scheduled to have bronchoscopy on 02/06/2022 but he refused it.  Subsequently palliative care was consulted.  Assessment and Plan: Acute on chronic respiratory failure with hypoxia (HCC) -currently on 10L oxygen via high flow nasal cannula.  Wean off as able. -PE ruled out.  Mass of right lung Postobstructive pneumonia - Per imaging, mass is large with airway compression -Pulmonary, oncology, radiation oncology following.  MRI of brain was negative for metastases. --Radiation oncology plans to start him on radiation -He was scheduled to have bronchoscopy on 02/06/2022 but he refused it.  Subsequently palliative care was consulted.  He was again agreeable for procedure on 02/07/2022. -Continue Rocephin as per pulmonary  Hypertensive urgency- (present on admission) - Initial blood pressure was elevated over 938 systolic.  Blood pressure has much improved.  Continue clonidine, losartan and hydrochlorthiazide  COPD (chronic obstructive pulmonary disease) ([place])-  (present on admission) - Continue current inhaled regimen -Continue prednisone.  Seizure disorder ([place]) - Continue Keppra.  Switch to oral.   Subjective:  Patient seen and examined at bedside.  No seizures, vomiting, agitation or fever reported.  Poor historian.  Physical Exam: Vitals:   02/08/22 0400 02/08/22 0500 02/08/22 0600 02/08/22 0700  BP: 118/73 (!) 143/96 118/73 117/84  Pulse: 87 86 74 85  Resp: (!) 28 19 (!) 26 (!) 31  Temp: 97.9 F (36.6 C)     TempSrc: Oral     SpO2: 97% (!) 87% (!) 88% (!) 83%  Weight:      Height:       General: On 10 L oxygen via high flow nasal cannula.  No distress.  ENT/neck: JVD is not elevated.  No thyromegaly respiratory: Bilateral decreased breath sounds at bases with some crackles, no wheezing.  Intermittently tachypneic CVS: S1-S2 heard; currently rate controlled  abdominal: Soft, nontender, mildly distended, no organomegaly, bowel sounds are heard Extremities: Trace lower extremity edema present; no cyanosis  CNS: Awake and alert.  Slow to respond.  Poor historian.  No focal neurologic deficit.  Moving extremities. Lymph: No palpable lymphadenopathy Skin: No ecchymosis/petechiae Psych: Currently not agitated.  Flat affect.   Musculoskeletal: No obvious joint tenderness/deformity  Data Reviewed: I have reviewed patient's investigation during this hospitalization myself.  No new labs today.  Family Communication: None at bedside   Disposition: Status is: Inpatient Remains inpatient appropriate because: Of severity of illness and patient still requiring significant amount of oxygen.      Planned Discharge Destination: Home    Author: Aline August, MD 02/08/2022 7:39 AM  For on call review [email or web address].
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
## 2022-02-08 NOTE — Plan of Care (Signed)
°  Problem: Activity: Goal: Risk for activity intolerance will decrease Outcome: Progressing   Problem: Nutrition: Goal: Adequate nutrition will be maintained Outcome: Progressing   Problem: Elimination: Goal: Will not experience complications related to bowel motility Outcome: Progressing Goal: Will not experience complications related to urinary retention Outcome: Progressing   Problem: Pain Managment: Goal: General experience of comfort will improve Outcome: Progressing   Problem: Safety: Goal: Ability to remain free from injury will improve Outcome: Progressing   Problem: Education: Goal: Knowledge of General Education information will improve Description: Including pain rating scale, medication(s)/side effects and non-pharmacologic comfort measures Outcome: Progressing   Problem: Health Behavior/Discharge Planning: Goal: Ability to manage health-related needs will improve Outcome: Progressing   Problem: Clinical Measurements: Goal: Ability to maintain clinical measurements within normal limits will improve Outcome: Progressing Goal: Will remain free from infection Outcome: Progressing Goal: Diagnostic test results will improve Outcome: Progressing Goal: Respiratory complications will improve Outcome: Progressing Goal: Cardiovascular complication will be avoided Outcome: Progressing   Problem: Activity: Goal: Risk for activity intolerance will decrease Outcome: Progressing   Problem: Nutrition: Goal: Adequate nutrition will be maintained Outcome: Progressing   Problem: Coping: Goal: Level of anxiety will decrease Outcome: Progressing   Problem: Elimination: Goal: Will not experience complications related to bowel motility Outcome: Progressing Goal: Will not experience complications related to urinary retention Outcome: Progressing   Problem: Pain Managment: Goal: General experience of comfort will improve Outcome: Progressing   Problem:  Safety: Goal: Ability to remain free from injury will improve Outcome: Progressing   Problem: Skin Integrity: Goal: Risk for impaired skin integrity will decrease Outcome: Progressing

## 2022-02-08 NOTE — Progress Notes (Signed)
NAME:  Richard [name], MRN:  [id number], DOB:  02-23-1971, LOS: 6 ADMISSION DATE:  02/02/2022, CONSULTATION DATE:  2/20 REFERRING MD:  Dr. Billy Fischer, CHIEF COMPLAINT:  Lung mass   BRIEF  51 year old male with PMH as below, which is significant for COPD, seizure disorder, HTN, substance abuse, and TIA. Had a recent admission after a brief loss of consciousness and right sided weakness of non-specific etiology. TIA vs Todds paralysis. As part of the workup he had a CT of the chest, which demonstrated a large right sided lung mass. He was discharged with plans for pulmonary eval as an outpatient, but was lost to follow up. He was eventually able to follow up with Dr. [name] and was scheduled for EBUS on 2/20. When he arrived for the procedure he was found to be hypertensive and hypoxemic. Required 10L [place] to keep sats > 90%. He was taken to the ED for further workup and admission. PCCM asked to evaluate.   Pertinent  Medical History   has a past medical history of Asthma, Brain bleed ([place]), DVT (deep venous thrombosis) ([place]) (02/19/2015), GERD (gastroesophageal reflux disease), History of home oxygen therapy, Hypertension, Seizures ([place]), and Stroke ([place]).  Significant Hospital Events: Including procedures, antibiotic start and stop dates in addition to other pertinent events   02/02/2022 - admit 2/21 MRI brain negative  2/22 No acute events, oncology workup underway. Bp remains elevated. PET scan completed  2/22 PET scan hypermetabolic right upper lobe mass, adjacent right upper lobe nodule, ipsilateral mediastinal/hilar/subcarinal hypermetabolic lymph nodes, volume loss in right middle and right lower lobes  Interim History / Subjective:   Remains on 10 L high flow nasal cannula. Out of bed to chair. Sat fluctuating between 89 to 95%. Wife at bedside Afebrile  Objective   Blood pressure 112/78, pulse 89, temperature 97.7 F (36.5 C), temperature source Oral, resp. rate (!) 27, height 5\' 9"   (1.753 m), weight 91.4 kg, SpO2 99 %.        Intake/Output Summary (Last 24 hours) at 02/08/2022 1228 Last data filed at 02/08/2022 1009 Gross per 24 hour  Intake 504.05 ml  Output 2850 ml  Net -2345.95 ml    Filed Weights   02/02/22 1321 02/03/22 0150  Weight: 96.6 kg 91.4 kg    Examination: General: Well-appearing middle-age male sitting up in bedside recliner in no acute distress HEENT: [place]/AT, MM pink/moist, PERRL,  Neuro: Alert and oriented x3, nonfocal CV: S1-S2 regular PULM: No accessory muscle use, decreased breath sounds on right, faint rhonchi GI: soft, bowel sounds active in all 4 quadrants, non-tender, non-distended, tolerating oral diet Extremities: warm/dry, no edema  Skin: no rashes or lesions  Labs 224 show normal electrolytes, no leukocytosis. Procalcitonin is low  Resolved Hospital Problem list     Assessment & Plan:   Acute hypxoxemic resp failure - severe (POA) -Required needing 10L [place] on admit, PE ruled out , ? Due to postobstructive pneumonia COPD without acute exacerbation  -Continue supplemental oxygen for sat goal greater than 92  Continue Anoro, albuterol as needed -Prednisone 40 mg daily   Rt Lung mass (POA) - large 10cm with airway compression  Postobstructive pneumonia  -CTA chest 2/20 revealed a new patchy infiltrates in the posteroinferior right lower lobe concerning for postobstructive pneumonia. Procal elevated at 1.13 P: Oncology following Continue to encourage aggressive pulmonary hygiene Empiric ceftriaxone He is agreeable to proceed with bronchoscopy for tissue diagnosis    Hypertensive crisis (POA) -BP 230/145  on admit  -  Echocardiogram 2/22 revealed EF 60 to 65% with grade 1 diastolic dysfunction. Negative bubble study  P: Continuous telemetry Continue clonidine , losartan and HCTZ   Sz disorder on dilantin P: Continue Keppra   Discussion: I reviewed CT and PET scan images with the patient and his wife at the  bedside.  I explained to him that he has at least stage IIIb and possibly stage IV lung cancer due to satellite nodule.  This is poor prognosis.  We can do radiation therapy alone, if he wants chemotherapy which would be his best chance of having a longer life, then he needs bronchoscopy procedure for tissue diagnosis.  He expressed that he is not ready to throw in the towel yet and would like that chance.  We then discussed risks of bronchoscopy procedure given that he is on high flow oxygen already including that of prolonged mechanical ventilation, worsening respiratory failure and even death.  He is willing to proceed with this high risk procedure.  I also explained to him that bronchoscopy would not expedite his discharge.  Discharge would be dependent on his oxygen needs We will plan for EBUS but should be able to obtain endobronchial biopsies since he appears to have distal right mainstem lesion.    Best Practice (right click and "Reselect all SmartList Selections" daily)   Diet/type: Regular consistency (see orders) - start diet  DVT prophylaxis: other waiting to see if he needs full AC GI prophylaxis: N/A Lines: N/A Foley:  N/A Code Status:  full code Last date of multidisciplinary goals of care discussion: Per primary   SIGNATURE:    Kara Mead MD. FCCP. [facility] Pager : [phone]  If no response to pager , please call [phone] until 7 pm After 7:00 pm call Elink  [phone]    02/08/2022, 12:28 PM

## 2022-02-09 ENCOUNTER — Ambulatory Visit: Payer: Medicare HMO

## 2022-02-09 DIAGNOSIS — J9621 Acute and chronic respiratory failure with hypoxia: Secondary | ICD-10-CM | POA: Diagnosis not present

## 2022-02-09 DIAGNOSIS — R918 Other nonspecific abnormal finding of lung field: Secondary | ICD-10-CM | POA: Diagnosis not present

## 2022-02-09 DIAGNOSIS — J449 Chronic obstructive pulmonary disease, unspecified: Secondary | ICD-10-CM | POA: Diagnosis not present

## 2022-02-09 DIAGNOSIS — I160 Hypertensive urgency: Secondary | ICD-10-CM | POA: Diagnosis not present

## 2022-02-09 DIAGNOSIS — J189 Pneumonia, unspecified organism: Secondary | ICD-10-CM | POA: Diagnosis not present

## 2022-02-09 LAB — GLUCOSE, CAPILLARY
Glucose-Capillary: 117 mg/dL — ABNORMAL HIGH (ref 70–99)
Glucose-Capillary: 136 mg/dL — ABNORMAL HIGH (ref 70–99)
Glucose-Capillary: 145 mg/dL — ABNORMAL HIGH (ref 70–99)

## 2022-02-09 NOTE — Progress Notes (Signed)
°  Progress Note   Patient: Richard [name] QIW:[id number] DOB: 04/30/1971 DOA: 02/02/2022     7 DOS: the patient was seen and examined on 02/09/2022   Brief hospital course: 51 y.o. male with PMH significant for HTN, stroke, brain bleed, seizure, DVT, GERD, COPD, home oxygen requirement, substance abuse, recent hospitalization for brief loss of consciousness and right-sided weakness possibly from TIA versus Todd's paralysis; as part of work-up CT chest showed large right-sided lung mass for which she was supposed to follow-up with pulmonary as an outpatient but was lost to follow-up.  He eventually was scheduled for EBUS by PCCM on 02/02/2022. When he arrived for the procedure he was found to be hypertensive to over 417 systolic and hypoxemic.  He required 10L [place] to keep sats > 90%. He was taken to the ED for further workup and admission. Admitted to hospitalist service. PCCM consulted.  Oncology/radiation oncology were also consulted.  He was scheduled to have bronchoscopy on 02/06/2022 but he refused it.  Subsequently palliative care was consulted.  Patient subsequently changed his mind and is currently agreeable for bronchoscopy.  Assessment and Plan: Acute on chronic respiratory failure with hypoxia (HCC) -currently on 8L oxygen via high flow nasal cannula.  Wean off as able. -PE ruled out.  Mass of right lung Postobstructive pneumonia - Per imaging, mass is large with airway compression -Pulmonary, oncology, radiation oncology following.  MRI of brain was negative for metastases. --Radiation oncology plans to start him on radiation -Continue Rocephin as per pulmonary -Plan for bronchoscopy by pulmonary tomorrow. -Palliative care following.  Currently remains full code.  Hypertensive urgency- (present on admission) - Initial blood pressure was elevated over 408 systolic.  Blood pressure has much improved.  Continue clonidine, losartan and hydrochlorthiazide  COPD (chronic obstructive pulmonary  disease) ([place])- (present on admission) - Continue current inhaled regimen -Continue prednisone.  Seizure disorder ([place]) - Continue Keppra.  Switch to oral after procedure.  Subjective:  Patient seen and examined at bedside.  Still short of breath with minimal exertion.  Poor historian.  No fever, agitation, seizures reported. Physical Exam: Vitals:   02/09/22 0500 02/09/22 0600 02/09/22 0625 02/09/22 0700  BP: 124/86 114/80  115/76  Pulse:      Resp: 15 17 19 17   Temp:      TempSrc:      SpO2:   95%   Weight:      Height:       General: On 8 L oxygen via high flow nasal cannula.  No acute distress ENT/neck: No palpable thyromegaly.  No JVD elevation  respiratory: Decreased breath sounds at bases bilaterally with scattered crackles CVS: Rate controlled currently; S1-S2 heard Abdominal: Soft, nontender, distended slightly; no organomegaly, normal bowel sounds heard. Extremities: No clubbing; mild lower extremity edema present  CNS: Alert and oriented.  No focal neurologic deficit.  Moves extremities  lymph: No obvious lymphadenopathy  skin: No obvious petechiae/lesions  psych: Extremely flat affect.  No signs of agitation currently Musculoskeletal: No obvious joint swelling/tenderness   Data Reviewed: No labs today    Family Communication: None at bedside   Disposition: Status is: Inpatient Remains inpatient appropriate because: Of severity of illness and patient still requiring significant amount of oxygen.      Planned Discharge Destination: Home       Author: Aline August, MD 02/09/2022 7:09 AM  For on call review [email or web address].

## 2022-02-09 NOTE — Progress Notes (Signed)
NAME:  Richard [name], MRN:  [id number], DOB:  10-01-1971, LOS: 7 ADMISSION DATE:  02/02/2022, CONSULTATION DATE:  02/02/2022 REFERRING MD:  Dr. Billy [name] - EDP CHIEF COMPLAINT:  Lung mass   History of Present Illness:  51 year old man who presented to [name] 2/20 for EBUS to evaluate lung mass; found to be hypertensive and hypoxemic. PMHx significant for HTN, COPD, TIA, seizure disorder, substance abuse.   Recent admission after a brief loss of consciousness and R-sided weakness of non-specific etiology (query TIA vs, Todd's paralysis). As part of the workup he had a CT Chest demonstrating a large right-sided lung mass. He was discharged with plans for pulmonary eval as an outpatient, but was lost to follow up. Eventually able to follow up with Dr. Lamonte [name] and was scheduled for EBUS on 2/20. When he arrived for the procedure he was found to be hypertensive and hypoxemic. Required 10L [place] to keep sats > 90%.   Patient was taken to the ED for further workup and admission. PCCM asked to evaluate.   Pertinent Medical History:   has a past medical history of Asthma, Brain bleed ([place]), DVT (deep venous thrombosis) ([place]) (02/19/2015), GERD (gastroesophageal reflux disease), History of home oxygen therapy, Hypertension, Seizures ([place]), and Stroke ([place]).  Significant Hospital Events: Including procedures, antibiotic start and stop dates in addition to other pertinent events   02/02/2022 - admit 2/21 MRI brain negative  2/22 No acute events, oncology workup underway. Bp remains elevated. PET scan completed  2/22 PET scan hypermetabolic right upper lobe mass, adjacent right upper lobe nodule, ipsilateral mediastinal/hilar/subcarinal hypermetabolic lymph nodes, volume loss in right middle and right lower lobes 2/27 Decreasing O2 requirements, 8L HFNC. Plan for bronch/EBUS 2/28.  Interim History / Subjective:  No acute events overnight Feeling well this morning Breathing feels better, no CP/SOB Up to chair at  bedside on evaluation, eating breakfast Motivated to go home Plan for bronch/EBUS with Richard [name] tomorrow, 2/28  Objective:  Blood pressure 115/76, pulse 70, temperature 98 F (36.7 C), temperature source Oral, resp. rate 17, height 5\' 9"  (1.753 m), weight 91.4 kg, SpO2 95 %.        Intake/Output Summary (Last 24 hours) at 02/09/2022 0737 Last data filed at 02/09/2022 0400 Gross per 24 hour  Intake 516.48 ml  Output 1400 ml  Net -883.52 ml    Filed Weights   02/02/22 1321 02/03/22 0150  Weight: 96.6 kg 91.4 kg   Physical Examination: General: Well-appearing middle-aged man in NAD. Up to chair at bedside. HEENT: [place]/AT, anicteric sclera, PERRL, moist mucous membranes. Neuro: Awake, oriented x 4. Responds to verbal stimuli. Following commands consistently. Moves all 4 extremities spontaneously. CV: RRR, no m/g/r. PULM: Breathing even and unlabored on 8L HFNC. Lung fields decreased on right, rhonchi to upper R lung fields, L lung fields clear. GI: Soft, nontender, nondistended. Normoactive bowel sounds. Extremities: No LE edema noted. Skin: Warm/dry, no rashes.  Resolved Hospital Problem List:     Assessment & Plan:   Acute hypxoxemic resp failure - severe (POA) COPD without acute exacerbation Required 10L  on admission, PE ruled out; query if due to postobstructive pneumonia. O2 needs slowly improving. - Continue supplemental O2 support - Wean O2 for sat > 90% - Continue bronchodilators - Continue steroids - Pulmonary hygiene, encourage mobilization   Rt Lung mass (POA) Postobstructive pneumonia  Large, 10cm with airway compression. CTA Chest 2/20 with new patchy infiltrates in the posteroinferior right lower lobe concerning for postobstructive pneumonia. Procal elevated  at 1.13. - Oncology following - Aggressive pulmonary hygiene, as above - Continue empiric ceftriaxone for now - Bronchoscopy/EBUS planned with Richard [name] 2/28, NPO midnight  Hypertensive crisis  (POA) BP 230/145 on admission. Echocardiogram 2/22 revealed EF 60 to 65% with grade 1 diastolic dysfunction. Negative bubble study. - Continue clonidine, losartan, HCTZ - Cardiac monitoring  Sz disorder on dilantin P: - Continue Keppra  GOC Per Dr. Bari [name] note dated 2/26: "Reviewed CT and PET scan images with the patient and his wife at the bedside.  I explained to him that he has at least stage IIIb and possibly stage IV lung cancer due to satellite nodule.  This is poor prognosis.  We can do radiation therapy alone, if he wants chemotherapy which would be his best chance of having a longer life, then he needs bronchoscopy procedure for tissue diagnosis.  He expressed that he is not ready to throw in the towel yet and would like that chance.  We then discussed risks of bronchoscopy procedure given that he is on high flow oxygen already including that of prolonged mechanical ventilation, worsening respiratory failure and even death.  He is willing to proceed with this high risk procedure.  I also explained to him that bronchoscopy would not expedite his discharge.  Discharge would be dependent on his oxygen needs."  Best Practice: (right click and "Reselect all SmartList Selections" daily)   Diet/type: Regular consistency (see orders) - start diet  DVT prophylaxis: other waiting to see if he needs full AC GI prophylaxis: N/A Lines: N/A Foley:  N/A Code Status:  full code Last date of multidisciplinary goals of care discussion: Per primary   Critical care time: N/A   Richard [name] [facility] 02/09/22 7:38 AM  Please see [email or web address] for pager details.  From 7A-7P if no response, please call [phone] After hours, please call ELink [phone]

## 2022-02-10 ENCOUNTER — Inpatient Hospital Stay (HOSPITAL_COMMUNITY): Payer: Medicare HMO | Admitting: Anesthesiology

## 2022-02-10 ENCOUNTER — Encounter (HOSPITAL_COMMUNITY): Payer: Self-pay | Admitting: Internal Medicine

## 2022-02-10 ENCOUNTER — Encounter (HOSPITAL_COMMUNITY)
Admission: RE | Disposition: A | Discharge status: Home / Self Care | Payer: Self-pay | Source: Ambulatory Visit | Attending: Internal Medicine

## 2022-02-10 ENCOUNTER — Ambulatory Visit: Payer: Medicare HMO

## 2022-02-10 DIAGNOSIS — R918 Other nonspecific abnormal finding of lung field: Secondary | ICD-10-CM

## 2022-02-10 DIAGNOSIS — I160 Hypertensive urgency: Secondary | ICD-10-CM | POA: Diagnosis not present

## 2022-02-10 DIAGNOSIS — J449 Chronic obstructive pulmonary disease, unspecified: Secondary | ICD-10-CM | POA: Diagnosis not present

## 2022-02-10 DIAGNOSIS — J9621 Acute and chronic respiratory failure with hypoxia: Secondary | ICD-10-CM | POA: Diagnosis not present

## 2022-02-10 DIAGNOSIS — I10 Essential (primary) hypertension: Secondary | ICD-10-CM

## 2022-02-10 DIAGNOSIS — Z9981 Dependence on supplemental oxygen: Secondary | ICD-10-CM

## 2022-02-10 DIAGNOSIS — J432 Centrilobular emphysema: Secondary | ICD-10-CM

## 2022-02-10 DIAGNOSIS — R590 Localized enlarged lymph nodes: Secondary | ICD-10-CM | POA: Diagnosis not present

## 2022-02-10 HISTORY — PX: VIDEO BRONCHOSCOPY: SHX5072

## 2022-02-10 HISTORY — PX: BRONCHIAL BRUSHINGS: SHX5108

## 2022-02-10 HISTORY — PX: BRONCHIAL NEEDLE ASPIRATION BIOPSY: SHX5106

## 2022-02-10 HISTORY — PX: BRONCHIAL BIOPSY: SHX5109

## 2022-02-10 HISTORY — PX: ENDOBRONCHIAL ULTRASOUND: SHX5096

## 2022-02-10 HISTORY — PX: BRONCHIAL WASHINGS: SHX5105

## 2022-02-10 LAB — BASIC METABOLIC PANEL
Anion gap: 6 (ref 5–15)
BUN: 21 mg/dL — ABNORMAL HIGH (ref 6–20)
CO2: 30 mmol/L (ref 22–32)
Calcium: 9.4 mg/dL (ref 8.9–10.3)
Chloride: 99 mmol/L (ref 98–111)
Creatinine, Ser: 0.76 mg/dL (ref 0.61–1.24)
GFR, Estimated: >60 mL/min (ref >60)
Glucose, Bld: 117 mg/dL — ABNORMAL HIGH (ref 70–99)
Potassium: 4 mmol/L (ref 3.5–5.1)
Sodium: 135 mmol/L (ref 135–145)

## 2022-02-10 LAB — CBC
HCT: 45.5 % (ref 39.0–52.0)
Hemoglobin: 14.4 g/dL (ref 13.0–17.0)
MCH: 29.3 pg (ref 26.0–34.0)
MCHC: 31.6 g/dL (ref 30.0–36.0)
MCV: 92.7 fL (ref 80.0–100.0)
Platelets: 325 10*3/uL (ref 150–400)
RBC: 4.91 MIL/uL (ref 4.22–5.81)
RDW: 14.8 % (ref 11.5–15.5)
WBC: 8.3 10*3/uL (ref 4.0–10.5)
nRBC: 0 % (ref 0.0–0.2)

## 2022-02-10 LAB — MAGNESIUM: Magnesium: 1.8 mg/dL (ref 1.7–2.4)

## 2022-02-10 LAB — GLUCOSE, CAPILLARY
Glucose-Capillary: 108 mg/dL — ABNORMAL HIGH (ref 70–99)
Glucose-Capillary: 121 mg/dL — ABNORMAL HIGH (ref 70–99)

## 2022-02-10 LAB — PROTIME-INR
INR: 0.9 (ref 0.8–1.2)
Prothrombin Time: 12.2 seconds (ref 11.4–15.2)

## 2022-02-10 SURGERY — ENDOBRONCHIAL ULTRASOUND (EBUS)
Anesthesia: General | Laterality: Bilateral

## 2022-02-10 MED ORDER — SUGAMMADEX SODIUM 200 MG/2ML IV SOLN
INTRAVENOUS | Status: DC | PRN
  Administered 2022-02-10: 200 mg via INTRAVENOUS

## 2022-02-10 MED ORDER — DEXAMETHASONE SODIUM PHOSPHATE 10 MG/ML IJ SOLN
INTRAMUSCULAR | Status: DC | PRN
  Administered 2022-02-10: 4 mg via INTRAVENOUS

## 2022-02-10 MED ORDER — FENTANYL CITRATE (PF) 100 MCG/2ML IJ SOLN
INTRAMUSCULAR | Status: AC
  Filled 2022-02-10: qty 2

## 2022-02-10 MED ORDER — PROPOFOL 10 MG/ML IV BOLUS
INTRAVENOUS | Status: AC
Start: 2022-02-10 — End: ?
  Filled 2022-02-10: qty 20

## 2022-02-10 MED ORDER — LACTATED RINGERS IV SOLN: INTRAVENOUS | Status: DC | PRN

## 2022-02-10 MED ORDER — ROCURONIUM BROMIDE 10 MG/ML (PF) SYRINGE
PREFILLED_SYRINGE | INTRAVENOUS | Status: DC | PRN
  Administered 2022-02-10 (×2): 10 mg via INTRAVENOUS
  Administered 2022-02-10: 40 mg via INTRAVENOUS

## 2022-02-10 MED ORDER — FENTANYL CITRATE (PF) 250 MCG/5ML IJ SOLN
INTRAMUSCULAR | Status: DC | PRN
  Administered 2022-02-10: 50 ug via INTRAVENOUS

## 2022-02-10 MED ORDER — PROPOFOL 10 MG/ML IV BOLUS
INTRAVENOUS | Status: DC | PRN
  Administered 2022-02-10: 150 mg via INTRAVENOUS

## 2022-02-10 MED ORDER — LIDOCAINE 2% (20 MG/ML) 5 ML SYRINGE
INTRAMUSCULAR | Status: DC | PRN
  Administered 2022-02-10: 60 mg via INTRAVENOUS

## 2022-02-10 MED ORDER — SUCCINYLCHOLINE CHLORIDE 200 MG/10ML IV SOSY
PREFILLED_SYRINGE | INTRAVENOUS | Status: DC | PRN
  Administered 2022-02-10: 100 mg via INTRAVENOUS

## 2022-02-10 MED ORDER — ONDANSETRON HCL 4 MG/2ML IJ SOLN
INTRAMUSCULAR | Status: DC | PRN
  Administered 2022-02-10: 4 mg via INTRAVENOUS

## 2022-02-10 MED ORDER — PROPOFOL 10 MG/ML IV BOLUS
INTRAVENOUS | Status: AC
  Filled 2022-02-10: qty 20

## 2022-02-10 NOTE — Op Note (Signed)
°  Name:  Richard [name] MRN:  [id number] DOB:  [birth year]  PROCEDURE NOTE  Procedure(s): Flexible bronchoscopy [phone] Brushing [phone] of the RUL & Bronchus intermedius Bronchial alveolar lavage (50569) of the RUL & RLL Endobronchial biopsy (79480) of the RUL carina & bronchus intermedius  Endobronchial ultrasound (16553) Transbronchial needle aspiration (74827) of the station 4R Transbronchial needle aspiration, additional lobe (07867) of the station 7  Indications:  Hilar / mediastinal lymphadenopathy.  Consent:  Written informed consent was obtained prior to the procedure. The risks of the procedure including coughing, bleeding and the small chance of lung puncture requiring chest tube were discussed in great detail. The benefits & alternatives including serial follow up were also discussed.  Anesthesia:  General endotracheal.  Procedure summary:  Appropriate equipment was assembled.  The patient was  identified as Publishing rights manager. Interim history obtained and brought to the operating room. Safety timeout was performed. The patient was placed supine on the operating table, airway established and general anesthesia administered by Anesthesia team.   After the appropriate level of anesthesia was assured, flexible video bronchoscope was lubricated and inserted through the endotracheal tube.    Airway examination was performed bilaterally to subsegmental level.  Minimal clear secretions were noted, Rt mainstem bronchus appeared slit like but could be traversed and there is still right middle and right lower lobe bronchi appeared open.  Right upper lobe bronchus takeoff was again narrowed.  The mucosa of all these bronchi appeared edematous  Endobronchial ultrasound video bronchoscope was then lubricated and inserted through the endotracheal tube. Surveillance of the mediastinal and and bilateral hilar lymph node stations was performed.  Pathologically enlarged lymph nodes were noted at station  4R and 7  Endobronchial ultrasound guided transbronchial needle aspiration of 4R (passes x1), 7 (passes x3) was performed, after which EBUS bronchoscope was withdrawn.  Flexible video bronchoscope was used again to perform random endobronchial mucosal biopsies of right upper lobe and bronchus intermedius.  Brushings were also obtained from these areas.  BAL was obtained.After ensuring hemostasis , the bronchoscope was withdrawn.  The patient was extubated in operating room and transferred to PACU. Post-procedure chest x-ray was ordered.  Specimens sent: Bronchial alveolar lavage specimen of the RT LL for  microbiology and cytology. TBNA for cytology from station 7 and 4R Bronchial brushings for cytology Endobronchial biopsies for pathology  Complications:  No immediate complications were noted.  Hemodynamic parameters and oxygenation remained stable throughout the procedure.  Estimated blood loss:  Less then 10 mL.   Kara Mead MD. [place] Flood. [place] [facility] Pager [phone] If no response call [phone]   02/10/2022 2:55 PM

## 2022-02-10 NOTE — Progress Notes (Signed)
NAME:  Richard [name], MRN:  [id number], DOB:  [birth year], LOS: 8 ADMISSION DATE:  02/02/2022, CONSULTATION DATE:  02/02/2022 REFERRING MD:  Dr. Billy Fischer - EDP CHIEF COMPLAINT:  Lung mass   History of Present Illness:  51 year old man who presented to [facility] 2/20 for EBUS to evaluate lung mass; found to be hypertensive and hypoxemic. PMHx significant for HTN, COPD, TIA, seizure disorder, substance abuse.   Recent admission after a brief loss of consciousness and R-sided weakness of non-specific etiology (query TIA vs, Todd's paralysis). As part of the workup he had a CT Chest demonstrating a large right-sided lung mass. He was discharged with plans for pulmonary eval as an outpatient, but was lost to follow up. Eventually able to follow up with Dr. [name] and was scheduled for EBUS on 2/20. When he arrived for the procedure he was found to be hypertensive and hypoxemic. Required 10L [place] to keep sats > 90%.   Patient was taken to the ED for further workup and admission. PCCM asked to evaluate.   Pertinent Medical History:   has a past medical history of Asthma, Brain bleed ([place]), DVT (deep venous thrombosis) ([place]) (02/19/2015), GERD (gastroesophageal reflux disease), History of home oxygen therapy, Hypertension, Seizures ([place]), and Stroke ([place]).  Significant Hospital Events: Including procedures, antibiotic start and stop dates in addition to other pertinent events   02/02/2022 - admit 2/21 MRI brain negative  2/22 No acute events, oncology workup underway. Bp remains elevated. PET scan completed  2/22 PET scan hypermetabolic right upper lobe mass, adjacent right upper lobe nodule, ipsilateral mediastinal/hilar/subcarinal hypermetabolic lymph nodes, volume loss in right middle and right lower lobes 2/27 Decreasing O2 requirements, 8L HFNC. Plan for bronch/EBUS 2/28. 2/28 O2 requirements continue to improve, 6L HFNC. Bronch/EBUS with Dr. Elsworth Soho today.  Interim History / Subjective:  No acute events  overnight Feeling okay this morning, breathing is better Denies chest pain/shortness of breath Up to chair at bedside Irritable, wants to go home Bronch/EBUS with Dr. Elsworth Soho planned for today  Objective:  Blood pressure (!) 123/91, pulse 75, temperature 97.8 F (36.6 C), temperature source Oral, resp. rate (!) 27, height 5\' 9"  (1.753 m), weight 91.4 kg, SpO2 (!) 89 %.        Intake/Output Summary (Last 24 hours) at 02/10/2022 0729 Last data filed at 02/10/2022 0355 Gross per 24 hour  Intake 109.67 ml  Output 2700 ml  Net -2590.33 ml    Filed Weights   02/02/22 1321 02/03/22 0150  Weight: 96.6 kg 91.4 kg   Physical Examination: General: Well-appearing middle-aged man in NAD. Up to chair at bedside. HEENT: [place]/AT, anicteric sclera, PERRL, moist mucous membranes. Neuro: Awake, oriented x 4. Responds to verbal stimuli. Following commands consistently. Moves all 4 extremities spontaneously. CV: RRR, no m/g/r. PULM: Breathing even and unlabored on 6L HFNC. Lung fields with rhonchi to R upper lung field, decreased throughout the R lung. L lung fields clear. GI: Soft, nontender, nondistended. Normoactive bowel sounds. Extremities: Trace bilateral symmetric LE edema noted. Skin: Warm/dry, no rashes.  Resolved Hospital Problem List:     Assessment & Plan:   Acute hypxoxemic resp failure - severe (POA) COPD without acute exacerbation Required 10L  on admission, PE ruled out; query if due to postobstructive pneumonia. O2 needs continue to improve. - Continue supplemental O2 support - Wean O2 for sat > 90% - Continue bronchodilators - Continue steroids - Pulmonary hygiene, encourage mobilization  Rt Lung mass (POA) Postobstructive pneumonia  Large, 10cm with airway  compression. CTA Chest 2/20 with new patchy infiltrates in the posteroinferior right lower lobe concerning for postobstructive pneumonia. Procal elevated at 1.13. - Oncology following - Aggressive pulmonary hygiene,  as above - Continue empiric ceftriaxone for now - Bronch/EBUS planned for today, 2/28 (Dr. Elsworth Soho) - F/u path once available - Will need outpatient pulmonary f/u  Hypertensive crisis (POA) BP 230/145 on admission. Echocardiogram 2/22 revealed EF 60 to 65% with grade 1 diastolic dysfunction. Negative bubble study. - Continue clonidine, losartan, HCTZ - Cardiac monitoring  Sz disorder on dilantin P: - Continue Keppra  GOC Per Dr. Bari Mantis note dated 2/26: "Reviewed CT and PET scan images with the patient and his wife at the bedside.  I explained to him that he has at least stage IIIb and possibly stage IV lung cancer due to satellite nodule.  This is poor prognosis.  We can do radiation therapy alone, if he wants chemotherapy which would be his best chance of having a longer life, then he needs bronchoscopy procedure for tissue diagnosis.  He expressed that he is not ready to throw in the towel yet and would like that chance.  We then discussed risks of bronchoscopy procedure given that he is on high flow oxygen already including that of prolonged mechanical ventilation, worsening respiratory failure and even death.  He is willing to proceed with this high risk procedure.  I also explained to him that bronchoscopy would not expedite his discharge.  Discharge would be dependent on his oxygen needs."  Best Practice: (right click and "Reselect all SmartList Selections" daily)   Diet/type: Regular consistency (see orders) - resume diet post-procedure DVT prophylaxis: other waiting to see if he needs full AC GI prophylaxis: N/A Lines: N/A Foley:  N/A Code Status:  full code Last date of multidisciplinary goals of care discussion: Per primary   Critical care time: N/A   Rhae Lerner [place] [facility] 02/10/22 7:29 AM  Please see [email or web address] for pager details.  From 7A-7P if no response, please call [phone] After hours, please call ELink [phone]

## 2022-02-10 NOTE — Transfer of Care (Signed)
Immediate Anesthesia Transfer of Care Note  Patient: Richard [name]  Procedure(s) Performed: ENDOBRONCHIAL ULTRASOUND (Bilateral) VIDEO BRONCHOSCOPY WITHOUT FLUORO BRONCHIAL WASHINGS BRONCHIAL NEEDLE ASPIRATION BIOPSIES BRONCHIAL BIOPSIES BRONCHIAL BRUSHINGS  Patient Location: PACU and Endoscopy Unit  Anesthesia Type:General  Level of Consciousness: awake, alert  and patient cooperative  Airway & Oxygen Therapy: Patient Spontanous Breathing and Patient connected to face mask oxygen  Post-op Assessment: Report given to RN and Post -op Vital signs reviewed and stable  Post vital signs: Reviewed and stable  Last Vitals:  Vitals Value Taken Time  BP    Temp    Pulse 88 02/10/22 1506  Resp 25 02/10/22 1506  SpO2 85 % 02/10/22 1506  Vitals shown include unvalidated device data.  Last Pain:  Vitals:   02/10/22 1330  TempSrc: Oral  PainSc: 0-No pain         Complications: No notable events documented.

## 2022-02-10 NOTE — Anesthesia Procedure Notes (Signed)
Procedure Name: Intubation Date/Time: 02/10/2022 1:55 PM Performed by: Eben Burow, CRNA Pre-anesthesia Checklist: Patient identified, Emergency Drugs available, Suction available, Patient being monitored and Timeout performed Patient Re-evaluated:Patient Re-evaluated prior to induction Oxygen Delivery Method: Circle system utilized Preoxygenation: Pre-oxygenation with 100% oxygen Induction Type: IV induction Ventilation: Mask ventilation without difficulty Laryngoscope Size: Mac and 4 Grade View: Grade I Tube type: Oral Tube size: 9.0 mm Number of attempts: 1 Airway Equipment and Method: Stylet Placement Confirmation: ETT inserted through vocal cords under direct vision, positive ETCO2 and breath sounds checked- equal and bilateral Secured at: 22 cm Tube secured with: Tape Dental Injury: Teeth and Oropharynx as per pre-operative assessment

## 2022-02-10 NOTE — Anesthesia Preprocedure Evaluation (Signed)
Anesthesia Evaluation  Patient identified by MRN, date of birth, ID band Patient awake    Reviewed: Allergy & Precautions, H&P , NPO status , Patient's Chart, lab work & pertinent test results  Airway Mallampati: II  TM Distance: >3 FB Neck ROM: Full    Dental no notable dental hx. (+) Teeth Intact, Dental Advisory Given   Pulmonary asthma , COPD,  COPD inhaler and oxygen dependent, Current Smoker and Patient abstained from smoking.,  Lung mass    Pulmonary exam normal breath sounds clear to auscultation       Cardiovascular hypertension, Pt. on medications  Rhythm:Regular Rate:Normal     Neuro/Psych  Headaches, Seizures -, Well Controlled,  TIAnegative psych ROS   GI/Hepatic Neg liver ROS, GERD  Medicated,  Endo/Other  negative endocrine ROS  Renal/GU negative Renal ROS  negative genitourinary   Musculoskeletal   Abdominal   Peds  Hematology negative hematology ROS (+)   Anesthesia Other Findings   Reproductive/Obstetrics negative OB ROS                             Anesthesia Physical Anesthesia Plan  ASA: 3  Anesthesia Plan: General   Post-op Pain Management: Minimal or no pain anticipated   Induction: Intravenous  PONV Risk Score and Plan: 1 and Ondansetron and Dexamethasone  Airway Management Planned: Oral ETT  Additional Equipment:   Intra-op Plan:   Post-operative Plan: Extubation in OR  Informed Consent: I have reviewed the patients History and Physical, chart, labs and discussed the procedure including the risks, benefits and alternatives for the proposed anesthesia with the patient or authorized representative who has indicated his/her understanding and acceptance.     Dental advisory given  Plan Discussed with: CRNA  Anesthesia Plan Comments:         Anesthesia Quick Evaluation

## 2022-02-10 NOTE — Progress Notes (Signed)
°  Progress Note   Patient: Richard [name] FOY:[id number] DOB: [birth year] DOA: 02/02/2022     8 DOS: the patient was seen and examined on 02/10/2022   Brief hospital course: 51 y.o. male with PMH significant for HTN, stroke, brain bleed, seizure, DVT, GERD, COPD, home oxygen requirement, substance abuse, recent hospitalization for brief loss of consciousness and right-sided weakness possibly from TIA versus Todd's paralysis; as part of work-up CT chest showed large right-sided lung mass for which she was supposed to follow-up with pulmonary as an outpatient but was lost to follow-up.  He eventually was scheduled for EBUS by PCCM on 02/02/2022. When he arrived for the procedure he was found to be hypertensive to over 767 systolic and hypoxemic.  He required 10L [place] to keep sats > 90%. He was taken to the ED for further workup and admission. Admitted to hospitalist service. PCCM consulted.  Oncology/radiation oncology were also consulted.  He was scheduled to have bronchoscopy on 02/06/2022 but he refused it.  Subsequently palliative care was consulted.  Patient subsequently changed his mind and is currently agreeable for bronchoscopy.  He is scheduled for bronchoscopy today.  Assessment and Plan: Acute on chronic respiratory failure with hypoxia (HCC) -currently on 6L oxygen via high flow nasal cannula.  Wean off as able. -PE ruled out.  Mass of right lung Postobstructive pneumonia - Per imaging, mass is large with airway compression -Pulmonary, oncology, radiation oncology following.  MRI of brain was negative for metastases. --Radiation oncology plans to start him on radiation -Continue Rocephin as per pulmonary -Plan for bronchoscopy by pulmonary today. -Palliative care following.  Currently remains full code.  Hypertensive urgency- (present on admission) - Initial blood pressure was elevated over 209 systolic.  Blood pressure has much improved.  Continue clonidine, losartan and  hydrochlorthiazide  COPD (chronic obstructive pulmonary disease) ([place])- (present on admission) - Continue current inhaled regimen -Continue prednisone.  Seizure disorder ([place]) - Continue Keppra.  Switch to oral after procedure today.    Subjective:  Patient seen and examined at bedside.  No seizures, agitation, fever or chest pain reported.  Poor historian.  Still short of breath with some exertion.  Physical Exam: Vitals:   02/10/22 0100 02/10/22 0200 02/10/22 0300 02/10/22 0400  BP: 119/78 127/86 125/90 (!) 132/96  Pulse:      Resp: (!) 31 (!) 28 (!) 30 (!) 22  Temp:    97.8 F (36.6 C)  TempSrc:    Oral  SpO2: (!) 88% 91% (!) 89% 90%  Weight:      Height:       General: No acute distress, currently on 6 L oxygen via high flow nasal cannula.  Poor historian.  Flat affect. respiratory: Bilateral decreased breath sounds at bases with some scattered crackles, no wheezing similar intermediate tachypneic CVS: S1-S2 heard, currently rate controlled Abdominal: Soft, nontender, mildly distended, no organomegaly, bowel sounds are heard Extremities: Trace lower extremity edema; no cyanosis C  Data Reviewed: I have reviewed patient's investigations myself.  Sodium 135, potassium 4, creatinine 0.76, magnesium 1.8, INR 0.9.  Hemoglobin 14.4, WBC 8.3   Family Communication: None at bedside   Disposition: Status is: Inpatient Remains inpatient appropriate because: Of severity of illness and patient still requiring significant amount of oxygen.      Planned Discharge Destination: Home         Author: Aline August, MD 02/10/2022 7:12 AM  For on call review [email or web address].

## 2022-02-11 ENCOUNTER — Telehealth: Payer: Self-pay | Admitting: Physician Assistant

## 2022-02-11 ENCOUNTER — Encounter (HOSPITAL_COMMUNITY): Payer: Self-pay | Admitting: Pulmonary Disease

## 2022-02-11 ENCOUNTER — Ambulatory Visit
Admission: RE | Admit: 2022-02-11 | Discharge: 2022-02-11 | Disposition: A | Discharge status: Home / Self Care | Payer: Medicare HMO | Source: Ambulatory Visit | Attending: Radiation Oncology | Admitting: Radiation Oncology

## 2022-02-11 DIAGNOSIS — J9621 Acute and chronic respiratory failure with hypoxia: Secondary | ICD-10-CM | POA: Diagnosis not present

## 2022-02-11 DIAGNOSIS — J432 Centrilobular emphysema: Secondary | ICD-10-CM | POA: Diagnosis not present

## 2022-02-11 DIAGNOSIS — F17210 Nicotine dependence, cigarettes, uncomplicated: Secondary | ICD-10-CM | POA: Diagnosis not present

## 2022-02-11 DIAGNOSIS — J438 Other emphysema: Secondary | ICD-10-CM

## 2022-02-11 DIAGNOSIS — Z510 Encounter for antineoplastic radiation therapy: Secondary | ICD-10-CM | POA: Insufficient documentation

## 2022-02-11 DIAGNOSIS — C3411 Malignant neoplasm of upper lobe, right bronchus or lung: Secondary | ICD-10-CM | POA: Insufficient documentation

## 2022-02-11 DIAGNOSIS — R918 Other nonspecific abnormal finding of lung field: Secondary | ICD-10-CM | POA: Diagnosis not present

## 2022-02-11 LAB — SURGICAL PATHOLOGY

## 2022-02-11 LAB — CYTOLOGY - NON PAP

## 2022-02-11 LAB — GLUCOSE, CAPILLARY
Glucose-Capillary: 119 mg/dL — ABNORMAL HIGH (ref 70–99)
Glucose-Capillary: 166 mg/dL — ABNORMAL HIGH (ref 70–99)

## 2022-02-11 MED ORDER — CLONIDINE HCL 0.3 MG PO TABS: 0.3000 mg | ORAL_TABLET | Freq: Two times a day (BID) | ORAL | 0 refills | Status: DC

## 2022-02-11 MED ORDER — LOSARTAN POTASSIUM 25 MG PO TABS: 25.0000 mg | ORAL_TABLET | Freq: Every day | ORAL | 0 refills | Status: DC

## 2022-02-11 NOTE — Progress Notes (Signed)
Discussed d/c instructions with pt and wife, piv d/c. No distress noted, Pt ambulatory independently. ?

## 2022-02-11 NOTE — Progress Notes (Signed)
Nutrition Follow-up ? ?DOCUMENTATION CODES:  ? ?Not applicable ? ?INTERVENTION:  ?- continue Ensure BID and will place order in Discharge Instructions/AVS to continue this for at least one month after d/c. ?- will place Low Sodium Nutrition Therapy handout from the [employer] in Discharge Instructions/AVS. ? ? ?NUTRITION DIAGNOSIS:  ? ?Increased nutrient needs related to acute illness, chronic illness as evidenced by estimated needs. -ongoing ? ?GOAL:  ? ?Patient will meet greater than or equal to 90% of their needs -minimally met on average ? ?MONITOR:  ? ?PO intake, Supplement acceptance, Labs, Weight trends ? ?ASSESSMENT:  ? ?51 y.o. male with medical history of HTN, seizure disorder, COPD, TIA, asthma, GERD, and diagnosed with a R lung mass. He was scheduled for EBUS but was found to be hypoxic and hypotensive and was referred to the ED. Patient reported being short of breath for the last few months since the diagnosis of his lung mass. He was admitted for acute respiratory failure with hypoxemia. ? ?The only documented meal completion percentages in the chart were 25% of breakfast and 100% of dinner on 2/23. ? ?Discharge order for d/c home entered in earlier this AM; no discharge summary yet. ? ?Patient sitting in the chair with his wife beside him. Patient is very ready to go home and is agitated by discharge not occurring fast enough. He was eating a bag of Skittles during discussion.  ? ?Current diet order is Regular. Liberalized from [employer] yesterday. Talked with patient about RD recommendation to decrease salt intake as patient reports he is a "salt fiend". Informed him handout would be put in Discharge Instructions. Encouraged gradual decrease in salt intake and decreasing use of salt shaker.  ? ?Yesterday he underwent flex bronch with biopsy d/t hilar/mediastinal lymphadenopathy.  ? ? ?Labs reviewed; CBGs: 166, 119 mg/dl, BUN: 21 mg/dl. ?Medications reviewed; 25 mg  hydrodiuril BID, 1 tablet multivitamin with minerals/day. ?  ? ?NUTRITION - FOCUSED PHYSICAL EXAM: ? ?Patient declined. ? ?Diet Order:   ?Diet Order   ? ?       ?  Diet - low sodium heart healthy       ?  ?  Diet regular Room service appropriate? Yes; Fluid consistency: Thin  Diet effective now       ?  ? ?  ?  ? ?  ? ? ?EDUCATION NEEDS:  ? ?Education needs have been addressed ? ?Skin:  Skin Assessment: Reviewed RN Assessment ? ?Last BM:  2/28 (medium amount, type not listed) ? ?Height:  ? ?Ht Readings from Last 1 Encounters:  ?02/10/22 $RemoveBe'5\' 9"'fDpiwACad$  (1.753 m)  ? ? ?Weight:  ? ?Wt Readings from Last 1 Encounters:  ?02/10/22 91.4 kg  ? ? ? ?BMI:  Body mass index is 29.76 kg/m?. ? ?Estimated Nutritional Needs:  ?Kcal:  2200-2400 kcal ?Protein:  110-125 grams ?Fluid:  >/= 2.2 L/day ? ? ? ? ? ?Jarome Matin, MS, RD, LDN ?Inpatient Clinical Dietitian ?RD pager # available in [place]  ?After hours/weekend pager # available in [place] ? ?

## 2022-02-11 NOTE — Discharge Summary (Signed)
Discharge Summary  Richard [name] PXT:[id number] DOB: October 19, 1971  PCP: [facility]  Admit date: 02/02/2022 Discharge date: 02/11/2022  Time spent: 81mins, more than 50% time spent on coordination of care. Case discussed with oncology , pulmonology , RN, case management prior to discharge  Patient and wife updated plan of care, both expressed understanding and  agreed with the plan  Recommendations for Outpatient Follow-up:  F/u with PCP within a week  for hospital discharge follow up, repeat cbc/bmp at follow up F/u with pulmonology F/u with oncology F/u with radiation oncology Home o2  Labs to follow up at hospital discharge follow up: Unresulted Labs (From admission, onward)     Start     Ordered   02/10/22 1412  Fungus Culture With Stain  RELEASE UPON ORDERING,   TIMED       Comments: Specimen A: Phone [phone]         Previous Biopsy:  no Is the patient on airborne/droplet precautions? No           Clinical History:  N/A Copy of Report to:  N/A Specimen Disposition: OR specimen holding      02/10/22 1412   02/10/22 1412  Fungus Stain  RELEASE UPON ORDERING,   TIMED       Comments: Specimen A: Phone [phone]         Previous Biopsy:  no Is the patient on airborne/droplet precautions? No           Clinical History:  N/A Copy of Report to:  N/A Specimen Disposition: OR specimen holding      02/10/22 1412   02/10/22 1412  Acid Fast Culture with reflexed sensitivities  RELEASE UPON ORDERING,   TIMED       Comments: Specimen A: Phone [phone]         Previous Biopsy:  no Is the patient on airborne/droplet precautions? No           Clinical History:  N/A Copy of Report to:  N/A Specimen Disposition: OR specimen holding      02/10/22 1412   02/10/22 1412  Acid Fast Smear (AFB)  RELEASE UPON ORDERING,   TIMED       Comments: Specimen A: Phone [phone]         Previous Biopsy:  no Is the patient on airborne/droplet precautions? No            Clinical History:  N/A Copy of Report to:  N/A Specimen Disposition: OR specimen holding      02/10/22 1412            Discharge Diagnoses:  Active Hospital Problems   Diagnosis Date Noted   Mediastinal lymphadenopathy    Obstructive pneumonia    Counseling regarding advance care planning and goals of care    COPD (chronic obstructive pulmonary disease) ([place]) 01/28/2022   Mass of right lung 04/09/2020   Acute on chronic respiratory failure with hypoxia ([place]) 02/13/2015   Seizure disorder ([place]) 12/23/2013   Hypertensive urgency 10/30/2012    Resolved Hospital Problems   Diagnosis Date Noted Date Resolved   Right lower lobe lung mass 04/09/2020 02/04/2022    Discharge Condition: stable  Diet recommendation: heart healthy  Filed Weights   02/02/22 1321 02/03/22 0150 02/10/22 1330  Weight: 96.6 kg 91.4 kg 91.4 kg    History of present illness: ( per admitting MD Dr Hal Hope) HPI: Richard [name] is a 51 y.o. male with history of hypertension, seizure disorder, COPD,  TIA was diagnosed with a right lung mass was scheduled for EBUS and patient was found to be hypoxic and hypotensive and was referred to the ER.  Patient states he has been short of breath for the last few months since the diagnosis of his lung mass.  Denies any chest pain.  Denies any productive cough.   ED Course: In the ER patient is hypoxic requiring 8 L of oxygen with diffuse wheezing on exam.  CT angiogram of the chest was negative for pulmonary embolism.  Pulmonary critical care was consulted.  Patient will be transferred to [facility] for bronchoscopy and radiation oncology consult.  Shows sinus tachycardia.  High sensitive troponin and BNP were negative.  COVID test is pending.    Hospital Course:  Active Problems:   Hypertensive urgency   Seizure disorder (HCC)   Acute on chronic respiratory failure with hypoxia (HCC)   Mass of right lung   COPD (chronic obstructive pulmonary disease)  ([place])   Counseling regarding advance care planning and goals of care   Obstructive pneumonia   Mediastinal lymphadenopathy   Assessment and Plan:  Lung mass with mediastinal lymphadenopathy  Status post bronchoscopy Follow-up on pathology/cytology result/culture AFB fungal stain Getting radiation treatment Follow-up with pulmonology/oncology/radiation oncology  Postobstructive pneumonia Status post bronchoscopy Finish antibiotic treatment  Acute on chronic hypoxic respiratory failure On home O2 3 L prior to coming to the hospital, discharged on home O2 4 L  COPD No wheezing Continue home regimen  Seizure disorder Continue home medication Keppra and Dilantin  Hypertension Continue home medication Follow-up with PCP   Discharge Exam: BP 130/86    Pulse 83    Temp (!) 97.4 F (36.3 C) (Oral)    Resp (!) 23    Ht 5\' 9"  (1.753 m)    Wt 91.4 kg    SpO2 (!) 89%    BMI 29.76 kg/m   General: NAD, aaox3 Cardiovascular: RRR Respiratory: Normal respiratory effort  Consultation: Pulmonology Medical oncology Radiation oncology Palliative care  Discharge Instructions     Amb Referral to Palliative Care   Complete by: As directed    Ambulatory referral to Hematology / Oncology   Complete by: As directed    Hospital discharge follow up for lung cancer   Diet - low sodium heart healthy   Complete by: As directed    For home use only DME oxygen   Complete by: As directed    Length of Need: Lifetime   Mode or (Route): Nasal cannula   Liters per Minute: 4   Frequency: Continuous (stationary and portable oxygen unit needed)   Oxygen conserving device: Yes   Oxygen delivery system: Gas   Increase activity slowly   Complete by: As directed       Allergies as of 02/11/2022       Reactions   Dilaudid [hydromorphone Hcl] Nausea Only, Other (See Comments)   Bradycardia and hyperthermia, too   Tape Itching, Dermatitis        Medication List     TAKE these  medications    aspirin 325 MG EC tablet Take 1 tablet (325 mg total) by mouth daily. Swallow whole.   cloNIDine 0.3 MG tablet Commonly known as: CATAPRES Take 1 tablet (0.3 mg total) by mouth 2 (two) times daily.   DILANTIN PO Take 1 capsule by mouth in the morning and at bedtime.   folic acid 1 MG tablet Commonly known as: FOLVITE Take 1 tablet (1 mg total)  by mouth daily.   INHALER DECONGESTANT IN Inhale 1 spray into the lungs daily as needed (sob/cough).   levETIRAcetam 500 MG tablet Commonly known as: KEPPRA Take 500 mg by mouth 2 (two) times daily.   lidocaine 5 % Commonly known as: Lidoderm Place 1 patch onto the skin daily. Remove & Discard patch within 12 hours or as directed by MD   losartan 25 MG tablet Commonly known as: COZAAR Take 1 tablet (25 mg total) by mouth daily.   methocarbamol 500 MG tablet Commonly known as: ROBAXIN Take 1 tablet (500 mg total) by mouth every 8 (eight) hours as needed for muscle spasms.   multivitamin with minerals Tabs tablet Take 1 tablet by mouth daily.   OXYGEN Inhale 2 L/min into the lungs as needed (for shortness of breath).   Spiriva Respimat 2.5 MCG/ACT Aers Generic drug: Tiotropium Bromide Monohydrate Inhale 2 puffs into the lungs daily.   Spiriva Respimat 2.5 MCG/ACT Aers Generic drug: Tiotropium Bromide Monohydrate Inhale 2 puffs into the lungs daily.   Stiolto Respimat 2.5-2.5 MCG/ACT Aers Generic drug: Tiotropium Bromide-Olodaterol Inhale 2 puffs into the lungs daily.   thiamine 100 MG tablet Take 1 tablet (100 mg total) by mouth daily.   vitamin B-12 1000 MCG tablet Commonly known as: CYANOCOBALAMIN Take 1 tablet (1,000 mcg total) by mouth daily.               Durable Medical Equipment  (From admission, onward)           Start     Ordered   02/11/22 0000  For home use only DME oxygen       Question Answer Comment  Length of Need Lifetime   Mode or (Route) Nasal cannula   Liters per  Minute 4   Frequency Continuous (stationary and portable oxygen unit needed)   Oxygen conserving device Yes   Oxygen delivery system Gas      02/11/22 0927           Allergies  Allergen Reactions   Dilaudid [Hydromorphone Hcl] Nausea Only and Other (See Comments)    Bradycardia and hyperthermia, too   Tape Itching and Dermatitis    Follow-up Information     Kyung Rudd, MD Follow up.   Specialty: Radiation Oncology Why: for radiation therapy Contact information: [place] [place] [id number] [facility] Follow up.   Specialty: Oncology Why: for lung cancer management Contact information: [address] [id number] [place] [phone]        Collene Gobble, MD Follow up on 03/04/2022.   Specialty: Pulmonary Disease Contact information: [place] [address] [place] [address] [phone]         [facility] Follow up.   Specialty: Family Medicine Why: f/u with your primary care doctor for hospital discharge follow up Contact information: [place] [address] [phone]         [facility] Follow up.   Why: Home oxygen.They will contact you about portables once at home. Contact information: [place] [address] [phone]                  The results of significant diagnostics from this hospitalization (including imaging, microbiology, ancillary and laboratory) are listed below for reference.    Significant Diagnostic Studies: CT Angio Chest PE W  and/or Wo Contrast  Result Date: 02/02/2022 CLINICAL DATA:  Shortness of breath EXAM: CT ANGIOGRAPHY CHEST WITH CONTRAST TECHNIQUE: Multidetector CT imaging of the chest was performed using the standard protocol during bolus administration of intravenous contrast. Multiplanar CT image reconstructions and MIPs were  obtained to evaluate the vascular anatomy. RADIATION DOSE REDUCTION: This exam was performed according to the departmental dose-optimization program which includes automated exposure control, adjustment of the mA and/or kV according to patient size and/or use of iterative reconstruction technique. CONTRAST:  42mL OMNIPAQUE IOHEXOL 350 MG/ML SOLN COMPARISON:  Previous studies including the CT done on 2022/02/07 FINDINGS: Cardiovascular: Heart is enlarged in size. There is ectasia of ascending thoracic aorta measuring 4.4 cm. There is homogeneous enhancement in the thoracic aorta. There is ectasia of main pulmonary artery measuring 3.7 cm suggesting pulmonary arterial hypertension. There are no intraluminal filling defects in the pulmonary artery branches. Contrast density in the some of the small peripheral pulmonary artery branches is less than optimal. There is extrinsic compression of segmental pulmonary artery branches in the right lower lung fields. Mediastinum/Nodes: There are enlarged lymph nodes in mediastinum. There is a large soft tissue mass in the right lower lobe extending into the mediastinum in the subcarinal region. Lungs/Pleura: There is a large lesion measures proximally 10.2 x 7 cm in the right lower lobe. There is satellite nodule lateral to the large mass measuring 2.7 cm in diameter in the right lower lobe. There are new small patchy infiltrates in the posteroinferior aspect of right lower lobe suggesting atelectasis/pneumonia. There is fluid/mucous filling some of the bronchi in right lower lobe. There is no significant pleural effusion or pneumothorax. Upper Abdomen: There is hyperplasia of left adrenal with possible 2.9 x 1.7 cm nodule. There is 13 mm nodule in the right adrenal. Adrenals are not included in their entirety limiting evaluation. Musculoskeletal: Unremarkable. Review of the MIP images confirms the above findings. IMPRESSION: There is 10.2 cm mass in the posteromedial right mid  and right lower lung fields suggesting primary malignant neoplasm with extension to the subcarinal region of mediastinum. There is satellite nodule lateral to the large mass in the right lung suggesting pulmonary metastatic disease. There are new patchy infiltrates in the posteroinferior right lower lobe suggesting atelectasis/pneumonia. There is no evidence of pulmonary artery embolism. There is ectasia of main pulmonary artery suggesting pulmonary arterial hypertension. There is extrinsic compression of segmental and subsegmental branches in the right lower lobe caused by malignant neoplasm. There is no evidence of thoracic aortic dissection. There is ectasia of ascending thoracic aorta. There is hyperplasia with possible nodules in both adrenals. Findings suggest possible incidental adenomas or metastatic disease. Electronically Signed   By: Elmer Picker M.D.   On: 02/02/2022 18:59   CT Angio Chest PE W and/or Wo Contrast  Result Date: 2022-02-07 CLINICAL DATA:  Right chest pain, shortness of breath EXAM: CT ANGIOGRAPHY CHEST WITH CONTRAST TECHNIQUE: Multidetector CT imaging of the chest was performed using the standard protocol during bolus administration of intravenous contrast. Multiplanar CT image reconstructions and MIPs were obtained to evaluate the vascular anatomy. RADIATION DOSE REDUCTION: This exam was performed according to the departmental dose-optimization program which includes automated exposure control, adjustment of the mA and/or kV according to patient size and/or use of iterative reconstruction technique. CONTRAST:  145mL OMNIPAQUE IOHEXOL 300 MG/ML  SOLN COMPARISON:  Chest radiograph dated 02.  CTA chest dated 10/12/2021. FINDINGS: Cardiovascular: Satisfactory opacification of the lungs bilaterally to the lobar level. No evidence  of pulmonary embolism. No evidence of thoracic aortic aneurysm. The heart is normal in size.  No pericardial effusion. Mediastinum/Nodes: Known mass  directly invades the right perihilar and subcarinal regions. Visualized thyroid is unremarkable. Lungs/Pleura: Large right lung mass transgressing the major fissure and invading the mediastinum, measuring 10.6 x 8.0 x 9.7 cm, previously 7.5 cm. Associated 2.0 x 3.0 cm satellite mass in the right upper lobe (series 6/image 84), previously 2.5 cm. Mild centrilobular emphysematous changes, upper lung predominant. No pleural effusion or pneumothorax. Upper Abdomen: Stable thickening of the left adrenal gland (series 5/image 146), previously characterized on PET as a benign adrenal adenoma. Musculoskeletal: Mild degenerative changes of the lower thoracic/upper lumbar spine. Review of the MIP images confirms the above findings. IMPRESSION: No evidence of pulmonary embolism. 10.6 cm right lung cancer with mediastinal invasion and 3.0 cm satellite mass in the right upper lobe, as described above, progressive from prior. Emphysema (ICD10-J43.9). Electronically Signed   By: Julian Hy M.D.   On: 01/24/2022 22:49   MR BRAIN W WO CONTRAST  Result Date: 02/04/2022 CLINICAL DATA:  Non-small cell lung cancer, staging EXAM: MRI HEAD WITHOUT AND WITH CONTRAST TECHNIQUE: Multiplanar, multiecho pulse sequences of the brain and surrounding structures were obtained without and with intravenous contrast. CONTRAST:  53mL GADAVIST GADOBUTROL 1 MMOL/ML IV SOLN COMPARISON:  10/13/2021 MRI head without contrast FINDINGS: Brain: No restricted diffusion to suggest acute or subacute infarct. Remote left frontal lobe infarct. No acute hemorrhage, mass, mass effect, or midline shift. No abnormal parenchymal or meningeal enhancement. Confluent T2 hyperintense signal in the periventricular white matter and pons, likely the sequela of severe chronic small vessel ischemic disease. Diffuse dilated perivascular spaces. Vascular: Normal flow voids. Skull and upper cervical spine: Normal marrow signal. Sinuses/Orbits: Chronic left maxillary  sinusitis. Mild mucosal thickening in the ethmoid air cells. The orbits are unremarkable. Other: None. IMPRESSION: No acute intracranial process. No evidence of metastatic disease in the brain. Electronically Signed   By: Merilyn Baba M.D.   On: 02/04/2022 00:21   NM PET Image Initial (PI) Skull Base To Thigh (F-18 FDG)  Result Date: 02/04/2022 CLINICAL DATA:  Subsequent treatment strategy for lung nodule. EXAM: NUCLEAR MEDICINE PET SKULL BASE TO THIGH TECHNIQUE: 11.8 mCi F-18 FDG was injected intravenously. Full-ring PET imaging was performed from the skull base to thigh after the radiotracer. CT data was obtained and used for attenuation correction and anatomic localization. Fasting blood glucose: 108 mg/dl COMPARISON:  CT chest 02/02/2022, 01/24/2022, CT chest abdomen pelvis 10/12/2021 and PET 04/15/2020. FINDINGS: Mediastinal blood pool activity: SUV max 2.5 Liver activity: SUV max NA NECK: Misregistration artifact. Inflammatory uptake within an opacified left maxillary sinus. No hypermetabolic lymph nodes. Incidental CT findings: None. CHEST: Hypermetabolic mass in the posterior right upper lobe measures 7.3 x 9.1 cm with an SUV max 15.5. Adjacent spiculated nodule seen more laterally in the right upper lobe measures 2.0 x 2.7 cm (8/22), SUV max 14.9. Mild right hilar hypermetabolism, SUV max 4 0. Correlation with CT is challenging without IV contrast. Subcarinal nodal uptake, SUV max 5.1. 1.4 cm low right paratracheal lymph node (4/70), SUV max 3.7. Axillary lymph nodes do not have metabolism above blood pool. Incidental CT findings: Enlarged pulmonic trunk. Heart is at the upper limits of normal in size. No pericardial effusion. Small right pleural effusion. Centrilobular emphysema. Markedly narrowed or obstructed right upper lobe bronchus and bronchus intermedius. Postobstructive subsegmental volume loss in the right lower lobe. Collapse of the right middle  lobe. ABDOMEN/PELVIS: No abnormal  hypermetabolism in the liver adrenal glands, spleen or pancreas. No lymph nodes. Incidental CT findings: Liver, gallbladder and right adrenal gland are unremarkable. Left adrenal adenoma measures 3.0 cm, unchanged. 2.0 cm low-attenuation lesion in the lower pole right kidney, possibly a cyst. Visualized portions of the kidneys, spleen, pancreas, stomach and bowel are otherwise grossly unremarkable. Bilateral scrotal hydroceles, left greater than right. SKELETON: No abnormal hypermetabolism. Incidental CT findings: Bone island in the right iliac wing. Degenerative changes in the spine. IMPRESSION: 1. Marked progression in right upper lobe lung cancers when compared with 04/15/2020. Ipsilateral mediastinal/hilar and subcarinal hypermetabolic lymph nodes. No evidence of distant metastatic disease. 2. Narrowing/obstruction the right upper lobe bronchus and bronchus intermedius, with associated volume loss in the right middle and right lower lobes. 3. Small right pleural effusion. 4. Left adrenal adenoma. 5. Bilateral scrotal hydroceles, left greater than right. 6. Enlarged pulmonic trunk, indicative of pulmonary arterial hypertension. 7.  Emphysema (ICD10-J43.9). Electronically Signed   By: Lorin Picket M.D.   On: 02/04/2022 13:40   DG Chest Portable 1 View  Result Date: 02/02/2022 CLINICAL DATA:  Provided history: Shortness of breath. EXAM: PORTABLE CHEST 1 VIEW COMPARISON:  Prior chest radiographs 01/24/2022 and earlier. Prior chest CT 01/24/2022. FINDINGS: Heart size within normal limits. Redemonstrated large right central/perihilar lung malignancy with and adjacent right upper lobe satellite mass. Mediastinal involvement was better appreciated on the recent prior chest CT of 01/24/2022. No appreciable airspace consolidation elsewhere. No evidence of pleural effusion or pneumothorax. No acute bony abnormality identified. IMPRESSION: Redemonstrated large right central/perihilar lung malignancy and adjacent  right upper lobe satellite mass. Mediastinal involvement was better appreciated on the recent prior chest CT of 01/24/2022. No appreciable airspace consolidation elsewhere. Electronically Signed   By: Kellie Simmering D.O.   On: 02/02/2022 14:47   DG Chest Portable 1 View  Result Date: 01/24/2022 CLINICAL DATA:  concern for volume overload EXAM: PORTABLE CHEST 1 VIEW COMPARISON:  Chest x-ray 07/11/2020, CT angiography chest 10/12/2021 FINDINGS: The heart and mediastinal contours are within normal limits. Large 10 cm right perihilar mass lesion consistent with known malignancy. Surrounding airspace opacity again noted. No pulmonary edema. No pleural effusion. No pneumothorax. No acute osseous abnormality. IMPRESSION: Large 10 cm right perihilar mass lesion consistent with known malignancy. Electronically Signed   By: Iven Finn M.D.   On: 01/24/2022 21:16   ECHOCARDIOGRAM COMPLETE BUBBLE STUDY  Result Date: 02/04/2022    ECHOCARDIOGRAM REPORT   Patient Name:   GORJE IYER Date of Exam: 02/04/2022 Medical Rec #:  [id number]    Height:       69.0 in Accession #:    [id number]   Weight:       201.5 lb Date of Birth:  Sep 06, 1971    BSA:          2.073 m Patient Age:    51 years     BP:           139/93 mmHg Patient Gender: M            HR:           80 bpm. Exam Location:  Inpatient Procedure: 2D Echo, Cardiac Doppler, Color Doppler and Saline Contrast Bubble            Study Indications:    ACUTE RESP. FAILURE W HYPOXIA  History:        Patient has no prior history of Echocardiogram examinations.  Stroke; Risk Factors:Hypertension.  Sonographer:    Beryle Beams Referring Phys: [place]  1. Left ventricular ejection fraction, by estimation, is 60 to 65%. The left ventricle has normal function. The left ventricle has no regional wall motion abnormalities. There is moderate left ventricular hypertrophy. Left ventricular diastolic parameters are consistent with Grade I  diastolic dysfunction (impaired relaxation).  2. Right ventricular systolic function is normal. The right ventricular size is normal.  3. The mitral valve is normal in structure. No evidence of mitral valve regurgitation. No evidence of mitral stenosis.  4. The aortic valve is normal in structure. Aortic valve regurgitation is mild. No aortic stenosis is present. Aortic regurgitation PHT measures 831 msec.  5. The inferior vena cava is normal in size with greater than 50% respiratory variability, suggesting right atrial pressure of 3 mmHg.  6. Agitated saline contrast bubble study was negative, with no evidence of any interatrial shunt. Comparison(s): No significant change from prior study. Prior images reviewed side by side. FINDINGS  Left Ventricle: Left ventricular ejection fraction, by estimation, is 60 to 65%. The left ventricle has normal function. The left ventricle has no regional wall motion abnormalities. The left ventricular internal cavity size was normal in size. There is  moderate left ventricular hypertrophy. Left ventricular diastolic parameters are consistent with Grade I diastolic dysfunction (impaired relaxation). Right Ventricle: The right ventricular size is normal. No increase in right ventricular wall thickness. Right ventricular systolic function is normal. Left Atrium: Left atrial size was normal in size. Right Atrium: Right atrial size was normal in size. Pericardium: There is no evidence of pericardial effusion. Mitral Valve: The mitral valve is normal in structure. No evidence of mitral valve regurgitation. No evidence of mitral valve stenosis. Tricuspid Valve: The tricuspid valve is normal in structure. Tricuspid valve regurgitation is not demonstrated. No evidence of tricuspid stenosis. Aortic Valve: The aortic valve is normal in structure. Aortic valve regurgitation is mild. Aortic regurgitation PHT measures 831 msec. No aortic stenosis is present. Aortic valve mean gradient measures  5.0 mmHg. Aortic valve peak gradient measures 7.6 mmHg. Aortic valve area, by VTI measures 2.89 cm. Pulmonic Valve: The pulmonic valve was normal in structure. Pulmonic valve regurgitation is not visualized. No evidence of pulmonic stenosis. Aorta: The aortic root is normal in size and structure. Venous: The inferior vena cava is normal in size with greater than 50% respiratory variability, suggesting right atrial pressure of 3 mmHg. IAS/Shunts: No atrial level shunt detected by color flow Doppler. Agitated saline contrast was given intravenously to evaluate for intracardiac shunting. Agitated saline contrast bubble study was negative, with no evidence of any interatrial shunt.  LEFT VENTRICLE PLAX 2D LVIDd:         4.70 cm LVIDs:         2.80 cm LV PW:         1.50 cm LV IVS:        1.30 cm LVOT diam:     2.10 cm LV SV:         90 LV SV Index:   43 LVOT Area:     3.46 cm  LV Volumes (MOD) LV vol d, MOD A2C: 49.2 ml LV vol d, MOD A4C: 48.8 ml LV vol s, MOD A2C: 21.6 ml LV vol s, MOD A4C: 23.1 ml LV SV MOD A2C:     27.6 ml LV SV MOD A4C:     48.8 ml LV SV MOD BP:  29.0 ml RIGHT VENTRICLE RV S prime:     19.20 cm/s TAPSE (M-mode): 2.2 cm LEFT ATRIUM             Index        RIGHT ATRIUM           Index LA diam:        3.10 cm 1.50 cm/m   RA Area:     12.10 cm LA Vol (A2C):   66.9 ml 32.28 ml/m  RA Volume:   31.00 ml  14.96 ml/m LA Vol (A4C):   28.2 ml 13.61 ml/m LA Biplane Vol: 46.2 ml 22.29 ml/m  AORTIC VALVE                     PULMONIC VALVE AV Area (Vmax):    3.04 cm      PV Vmax:       0.60 m/s AV Area (Vmean):   2.84 cm      PV Vmean:      36.000 cm/s AV Area (VTI):     2.89 cm      PV VTI:        0.109 m AV Vmax:           138.00 cm/s   PV Peak grad:  1.5 mmHg AV Vmean:          109.000 cm/s  PV Mean grad:  1.0 mmHg AV VTI:            0.312 m AV Peak Grad:      7.6 mmHg AV Mean Grad:      5.0 mmHg LVOT Vmax:         121.00 cm/s LVOT Vmean:        89.500 cm/s LVOT VTI:          0.260 m LVOT/AV VTI  ratio: 0.83 AI PHT:            831 msec  AORTA Ao Root diam: 3.70 cm MITRAL VALVE MV Area (PHT): 2.95 cm    SHUNTS MV Decel Time: 257 msec    Systemic VTI:  0.26 m MV E velocity: 70.40 cm/s  Systemic Diam: 2.10 cm MV A velocity: 93.00 cm/s MV E/A ratio:  0.76 Candee Furbish MD Electronically signed by Candee Furbish MD Signature Date/Time: 02/04/2022/3:41:03 PM    Final    VAS [place] LOWER EXTREMITY VENOUS (DVT)  Result Date: 02/03/2022  Lower Venous DVT Study Patient Name:  DAVON ABDELAZIZ  Date of Exam:   02/03/2022 Medical Rec #: [id number]     Accession #:    [id number] Date of Birth: August 15, 1971     Patient Gender: M Patient Age:   82 years Exam Location:  [facility] Procedure:      VAS [place] LOWER EXTREMITY VENOUS (DVT) Referring Phys: Gean Birchwood --------------------------------------------------------------------------------  Indications: Edema.  Risk Factors: None identified. Limitations: Poor ultrasound/tissue interface and patient positioning. Comparison Study: No prior studies. Performing Technologist: Oliver Hum RVT  Examination Guidelines: A complete evaluation includes B-mode imaging, spectral Doppler, color Doppler, and power Doppler as needed of all accessible portions of each vessel. Bilateral testing is considered an integral part of a complete examination. Limited examinations for reoccurring indications may be performed as noted. The reflux portion of the exam is performed with the patient in reverse Trendelenburg.  +---------+---------------+---------+-----------+----------+--------------+  RIGHT     Compressibility Phasicity Spontaneity Properties Thrombus Aging  +---------+---------------+---------+-----------+----------+--------------+  CFV       Full  Yes       Yes                                    +---------+---------------+---------+-----------+----------+--------------+  SFJ       Full                                                              +---------+---------------+---------+-----------+----------+--------------+  FV Prox   Full                                                             +---------+---------------+---------+-----------+----------+--------------+  FV Mid    Full                                                             +---------+---------------+---------+-----------+----------+--------------+  FV Distal Full                                                             +---------+---------------+---------+-----------+----------+--------------+  PFV       Full                                                             +---------+---------------+---------+-----------+----------+--------------+  POP       Full            Yes       Yes                                    +---------+---------------+---------+-----------+----------+--------------+  PTV       Full                                                             +---------+---------------+---------+-----------+----------+--------------+  PERO      Full                                                             +---------+---------------+---------+-----------+----------+--------------+   +---------+---------------+---------+-----------+----------+--------------+  LEFT      Compressibility Phasicity  Spontaneity Properties Thrombus Aging  +---------+---------------+---------+-----------+----------+--------------+  CFV       Full            Yes       Yes                                    +---------+---------------+---------+-----------+----------+--------------+  SFJ       Full                                                             +---------+---------------+---------+-----------+----------+--------------+  FV Prox   Full                                                             +---------+---------------+---------+-----------+----------+--------------+  FV Mid    Full                                                              +---------+---------------+---------+-----------+----------+--------------+  FV Distal Full                                                             +---------+---------------+---------+-----------+----------+--------------+  PFV       Full                                                             +---------+---------------+---------+-----------+----------+--------------+  POP       Full            Yes       Yes                                    +---------+---------------+---------+-----------+----------+--------------+  PTV       Full                                                             +---------+---------------+---------+-----------+----------+--------------+  PERO      Full                                                             +---------+---------------+---------+-----------+----------+--------------+  Summary: RIGHT: - There is no evidence of deep vein thrombosis in the lower extremity. However, portions of this examination were limited- see technologist comments above.  - No cystic structure found in the popliteal fossa.  LEFT: - There is no evidence of deep vein thrombosis in the lower extremity. However, portions of this examination were limited- see technologist comments above.  - No cystic structure found in the popliteal fossa.  *See table(s) above for measurements and observations. Electronically signed by Servando Snare MD on 02/03/2022 at 11:15:12 AM.    Final     Microbiology: Recent Results (from the past 240 hour(s))  SARS Coronavirus 2 by RT PCR (hospital order, performed in [facility] hospital lab) Nasopharyngeal Nasopharyngeal Swab     Status: None   Collection Time: 02/02/22 11:48 AM   Specimen: Nasopharyngeal Swab  Result Value Ref Range Status   SARS Coronavirus 2 NEGATIVE NEGATIVE Final    Comment: (NOTE) SARS-CoV-2 target nucleic acids are NOT DETECTED.  The SARS-CoV-2 RNA is generally detectable in upper and lower respiratory specimens during the acute phase  of infection. The lowest concentration of SARS-CoV-2 viral copies this assay can detect is 250 copies / mL. A negative result does not preclude SARS-CoV-2 infection and should not be used as the sole basis for treatment or other patient management decisions.  A negative result may occur with improper specimen collection / handling, submission of specimen other than nasopharyngeal swab, presence of viral mutation(s) within the areas targeted by this assay, and inadequate number of viral copies (<250 copies / mL). A negative result must be combined with clinical observations, patient history, and epidemiological information.  Fact Sheet for Patients:   StrictlyIdeas.no  Fact Sheet for Healthcare Providers: BankingDealers.co.za  This test is not yet approved or  cleared by the [place] FDA and has been authorized for detection and/or diagnosis of SARS-CoV-2 by FDA under an Emergency Use Authorization (EUA).  This EUA will remain in effect (meaning this test can be used) for the duration of the COVID-19 declaration under Section 564(b)(1) of the Act, 21 U.S.C. section 360bbb-3(b)(1), unless the authorization is terminated or revoked sooner.  Performed at [facility], [place] [address] [place], [place] [address]   MRSA Next Gen by PCR, Nasal     Status: None   Collection Time: 02/03/22  1:25 AM   Specimen: Nasal Mucosa; Nasal Swab  Result Value Ref Range Status   MRSA by PCR Next Gen NOT DETECTED NOT DETECTED Final    Comment: (NOTE) The GeneXpert MRSA Assay (FDA approved for NASAL specimens only), is one component of a comprehensive MRSA colonization surveillance program. It is not intended to diagnose MRSA infection nor to guide or monitor treatment for MRSA infections. Test performance is not FDA approved in patients less than 70 years old. Performed at [facility], [place] [address] [place], [place]  [address]   Resp Panel by RT-PCR (Flu A&B, Covid) Nasopharyngeal Swab     Status: None   Collection Time: 02/03/22  6:49 AM   Specimen: Nasopharyngeal Swab; Nasopharyngeal(NP) swabs in vial transport medium  Result Value Ref Range Status   SARS Coronavirus 2 by RT PCR NEGATIVE NEGATIVE Final    Comment: (NOTE) SARS-CoV-2 target nucleic acids are NOT DETECTED.  The SARS-CoV-2 RNA is generally detectable in upper respiratory specimens during the acute phase of infection. The lowest concentration of SARS-CoV-2 viral copies this assay can detect is 138 copies/mL. A negative result does not  preclude SARS-Cov-2 infection and should not be used as the sole basis for treatment or other patient management decisions. A negative result may occur with  improper specimen collection/handling, submission of specimen other than nasopharyngeal swab, presence of viral mutation(s) within the areas targeted by this assay, and inadequate number of viral copies(<138 copies/mL). A negative result must be combined with clinical observations, patient history, and epidemiological information. The expected result is Negative.  Fact Sheet for Patients:  EntrepreneurPulse.com.au  Fact Sheet for Healthcare Providers:  IncredibleEmployment.be  This test is no t yet approved or cleared by the [place] FDA and  has been authorized for detection and/or diagnosis of SARS-CoV-2 by FDA under an Emergency Use Authorization (EUA). This EUA will remain  in effect (meaning this test can be used) for the duration of the COVID-19 declaration under Section 564(b)(1) of the Act, 21 U.S.C.section 360bbb-3(b)(1), unless the authorization is terminated  or revoked sooner.       Influenza A by PCR NEGATIVE NEGATIVE Final   Influenza B by PCR NEGATIVE NEGATIVE Final    Comment: (NOTE) The Xpert Xpress SARS-CoV-2/FLU/RSV plus assay is intended as an aid in the diagnosis of influenza from  Nasopharyngeal swab specimens and should not be used as a sole basis for treatment. Nasal washings and aspirates are unacceptable for Xpert Xpress SARS-CoV-2/FLU/RSV testing.  Fact Sheet for Patients: EntrepreneurPulse.com.au  Fact Sheet for Healthcare Providers: IncredibleEmployment.be  This test is not yet approved or cleared by the [place] FDA and has been authorized for detection and/or diagnosis of SARS-CoV-2 by FDA under an Emergency Use Authorization (EUA). This EUA will remain in effect (meaning this test can be used) for the duration of the COVID-19 declaration under Section 564(b)(1) of the Act, 21 U.S.C. section 360bbb-3(b)(1), unless the authorization is terminated or revoked.  Performed at [facility], [place] [address], [place], [place] [address]   Culture, Respiratory w Gram Stain     Status: None (Preliminary result)   Collection Time: 02/10/22  2:05 PM   Specimen: Bronchial Alveolar Lavage; Respiratory  Result Value Ref Range Status   Specimen Description   Final    BRONCHIAL ALVEOLAR LAVAGE Performed at [place] [address], [place], [place] [address]    Special Requests   Final    NONE RLL Performed at [place] [address], [place], [place] [address]    Gram Stain   Final    NO SQUAMOUS EPITHELIAL CELLS SEEN FEW WBC SEEN NO ORGANISMS SEEN    Culture   Final    NO GROWTH < 12 HOURS Performed at [facility], [place] [address], [place], [place] [address]    Report Status PENDING  Incomplete     Labs: Basic Metabolic Panel: Recent Labs  Lab 02/06/22 0657 02/10/22 0308  NA 135 135  K 3.9 4.0  CL 101 99  CO2 29 30  GLUCOSE 112* 117*  BUN 11 21*  CREATININE 0.72 0.76  CALCIUM 8.8* 9.4  MG  --  1.8   Liver Function Tests: No results for input(s): AST, ALT, ALKPHOS, BILITOT, PROT, ALBUMIN in the last 168 hours. No results for  input(s): LIPASE, AMYLASE in the last 168 hours. No results for input(s): AMMONIA in the last 168 hours. CBC: Recent Labs  Lab 02/06/22 0657 02/10/22 0308  WBC 9.3 8.3  HGB 14.7 14.4  HCT 45.8 45.5  MCV 93.7 92.7  PLT 318 325   Cardiac Enzymes:  No results for input(s): CKTOTAL, CKMB, CKMBINDEX, TROPONINI in the last 168 hours. BNP: BNP (last 3 results) Recent Labs    01/24/22 2054 02/02/22 1405 02/04/22 0630  BNP 37.6 34.4 21.5    ProBNP (last 3 results) No results for input(s): PROBNP in the last 8760 hours.  CBG: Recent Labs  Lab 02/09/22 2330 02/10/22 0813 02/10/22 2030 02/11/22 0050 02/11/22 0740  GLUCAP 145* 108* 121* 166* 119*    FURTHER DISCHARGE INSTRUCTIONS:   Get Medicines reviewed and adjusted: Please take all your medications with you for your next visit with your Primary MD   Laboratory/radiological data: Please request your Primary MD to go over all hospital tests and procedure/radiological results at the follow up, please ask your Primary MD to get all Hospital records sent to his/her office.   In some cases, they will be blood work, cultures and biopsy results pending at the time of your discharge. Please request that your primary care M.D. goes through all the records of your hospital data and follows up on these results.   Also Note the following: If you experience worsening of your admission symptoms, develop shortness of breath, life threatening emergency, suicidal or homicidal thoughts you must seek medical attention immediately by calling 911 or calling your MD immediately  if symptoms less severe.   You must read complete instructions/literature along with all the possible adverse reactions/side effects for all the Medicines you take and that have been prescribed to you. Take any new Medicines after you have completely understood and accpet all the possible adverse reactions/side effects.    Do not drive when taking Pain medications or  sleeping medications (Benzodaizepines)   Do not take more than prescribed Pain, Sleep and Anxiety Medications. It is not advisable to combine anxiety,sleep and pain medications without talking with your primary care practitioner   Special Instructions: If you have smoked or chewed Tobacco  in the last 2 yrs please stop smoking, stop any regular Alcohol  and or any Recreational drug use.   Wear Seat belts while driving.   Please note: You were cared for by a hospitalist during your hospital stay. Once you are discharged, your primary care physician will handle any further medical issues. Please note that NO REFILLS for any discharge medications will be authorized once you are discharged, as it is imperative that you return to your primary care physician (or establish a relationship with a primary care physician if you do not have one) for your post hospital discharge needs so that they can reassess your need for medications and monitor your lab values.     Signed:  Florencia Reasons MD, PhD, FACP  [facility] 02/11/2022, 2:24 PM

## 2022-02-11 NOTE — Progress Notes (Addendum)
SATURATION QUALIFICATIONS: (This note is used to comply with regulatory documentation for home oxygen) ? ?Patient Saturations on Room Air at Rest = 84% ? ?Patient Saturations on Room Air while Ambulating = 75% ? ?Patient Saturations on 4 Liters of oxygen while Ambulating = 90% ? ?Please briefly explain why patient needs home oxygen: Pt needs 4l oxygen to help keep saturations above 88% and to prevent shortness of breath and distress ?

## 2022-02-11 NOTE — Progress Notes (Addendum)
? ?NAME:  Richard [name], MRN:  [id number], DOB:  October 21, 1971, LOS: 9 ?ADMISSION DATE:  02/02/2022, CONSULTATION DATE:  02/02/2022 ?REFERRING MD:  Dr. Billy Fischer - EDP CHIEF COMPLAINT:  Lung mass  ? ?History of Present Illness:  ?51 year old man who presented to [facility] 2/20 for EBUS to evaluate lung mass; found to be hypertensive and hypoxemic. PMHx significant for HTN, COPD, TIA, seizure disorder, substance abuse.  ? ?Recent admission after a brief loss of consciousness and R-sided weakness of non-specific etiology (query TIA vs, Todd's paralysis). As part of the workup he had a CT Chest demonstrating a large right-sided lung mass. He was discharged with plans for pulmonary eval as an outpatient, but was lost to follow up. Eventually able to follow up with Dr. [name] and was scheduled for EBUS on 2/20. When he arrived for the procedure he was found to be hypertensive and hypoxemic. Required 10L [place] to keep sats > 90%. ? ? Patient was taken to the ED for further workup and admission. PCCM asked to evaluate.  ? ?Pertinent Medical History:  ? has a past medical history of Asthma, Brain bleed ([place]), DVT (deep venous thrombosis) ([place]) (02/19/2015), GERD (gastroesophageal reflux disease), History of home oxygen therapy, Hypertension, Seizures ([place]), and Stroke ([place]). ? ?Significant Hospital Events: ?Including procedures, antibiotic start and stop dates in addition to other pertinent events   ?02/02/2022 - admit ?2/21 MRI brain negative  ?2/22 No acute events, oncology workup underway. Bp remains elevated. PET scan completed  ?2/22 PET scan hypermetabolic right upper lobe mass, adjacent right upper lobe nodule, ipsilateral mediastinal/hilar/subcarinal hypermetabolic lymph nodes, volume loss in right middle and right lower lobes ?2/27 Decreasing O2 requirements, 8L HFNC. Plan for bronch/EBUS 2/28. ?2/28 O2 requirements continue to improve, 6L HFNC. Bronch/EBUS with Dr. Elsworth Soho > brushings, BAL, biopsies, TBNA of station 4R and  7. ? ?Interim History / Subjective:  ?Bronch and EBUS performed yesterday, tolerated well. ?Remains on O2 at 4L, sats 90 - 94%. ?Comfortable. ?States he has home O2 already and was on 3L at home on PRN basis not 24/7. ?Very eager to go home. ? ?Objective:  ?Blood pressure (!) 135/92, pulse 89, temperature (!) 97.4 ?F (36.3 ?C), temperature source Oral, resp. rate (!) 23, height _0  (1.753 m), weight 91.4 kg, SpO2 (!) 86 %. ?   ?   ? ?Intake/Output Summary (Last 24 hours) at 02/11/2022 0720 ?Last data filed at 02/11/2022 4166 ?Gross per 24 hour  ?Intake 1199.52 ml  ?Output 1910 ml  ?Net -710.48 ml  ? ? ?Filed Weights  ? 02/02/22 1321 02/03/22 0150 02/10/22 1330  ?Weight: 96.6 kg 91.4 kg 91.4 kg  ? ?Physical Examination: ?General: Adult male, sitting up in chair eating breakfast, in NAD. ?Neuro: A&O x 3, no deficits. ?HEENT: [place]/AT. Sclerae anicteric. EOMI. ?Cardiovascular: RRR, no M/R/G.  ?Lungs: Respirations even and unlabored.  CTA bilaterally, No W/R/R. ?Abdomen: BS x 4, soft, NT/ND.  ?Musculoskeletal: No gross deformities, no edema.  ?Skin: Intact, warm, no rashes. ? ? ?Assessment & Plan:  ? ?Acute hypxoxemic resp failure - severe (POA) ?COPD without acute exacerbation ?Required 10L [place] on admission now down to 4L.  He reports having home O2 and on 3L on PRN basis. ?- Continue supplemental O2 support ?- Wean O2 for sat > 90% ?- Continue home O2 (states he has home O2 setup already and was using 3L on PRN basis).  He is asking for portable device (backpack type), will ask TOC to assist ?- Continue bronchodilators ?-  Pulmonary hygiene, encourage mobilization ? ?Rt Lung mass (POA) - s/p EBUS with biopsies 2/28 ?Postobstructive pneumonia - s/p 10 days Ceftriaxone ?Large, 10cm with airway compression. CTA Chest 2/20 with new patchy infiltrates in the posteroinferior right lower lobe concerning for postobstructive pneumonia. Procal elevated at 1.13. ?- F/u on pathology ?- Oncology following ?- Aggressive pulmonary hygiene,  as above ?- Can d/c Ceftriaxone today, has had 10 days ?- Will need outpatient pulmonary f/u, he has appointment for 3/22 with Dr [name] and have stressed importance of follow up with pt ? ?Hypertensive crisis (POA) ?BP 230/145 on admission. Echocardiogram 2/22 revealed EF 60 to 65% with grade 1 diastolic dysfunction. Negative bubble study. ?- Continue clonidine, losartan, HCTZ ?- Cardiac monitoring ? ?Sz disorder on dilantin ?P: ?- Continue Keppra ? ? ?OK to d/c from our standpoint as long as has home O2 confirmed / arranged.  Will ask TOC to assist with him getting portable backpack type O2 device. ? ?Best Practice: (right click and "Reselect all SmartList Selections" daily)  ?Per primary team ? ? ?Montey Hora, PA - C ?[facility] ?For pager details, please see AMION or use Epic chat  ?After 1900, please call [facility] for cross coverage needs ?02/11/2022, 7:54 AM ? ?

## 2022-02-11 NOTE — Telephone Encounter (Signed)
[facility] Office visit with Dr. Rosana Fret. Loanne Drilling or APP in 2 weeks please  ?-To reassess oxygen needs ?-Review biopsies ?

## 2022-02-11 NOTE — TOC Transition Note (Signed)
Transition of Care (TOC) - CM/SW Discharge Note ? ? ?Patient Details  ?Name: Richard [name] ?MRN: [id number] ?Date of Birth: [birth year] ? ?Transition of Care [facility]) CM/SW Contact:  ?Dessa Phi, RN ?Phone Number: ?02/11/2022, 9:42 AM ? ? ?Clinical Narrative: Per prior notes(please review)-already active w/[employer] for home 02-they will contact patient about portables @ home. Noted may have new 02 requirements per regulatory standards will need 02 sats, & home 02 order prior [facility] bringing travel tank to rm prior d/c. No further CM needs.  ? ? ? ?Final next level of care: Home/Self Care ?Barriers to Discharge: No Barriers Identified ? ? ?Patient Goals and CMS Choice ?Patient states their goals for this hospitalization and ongoing recovery are:: Home ?CMS Medicare.gov Compare Post Acute Care list provided to:: Patient ?Choice offered to / list presented to : Patient ? ?Discharge Placement ?  ?           ?  ?  ?  ?  ? ?Discharge Plan and Services ?  ?Discharge Planning Services: CM Consult ?Post Acute Care Choice: NA          ?  ?  ?  ?  ?  ?  ?  ?  ?  ?  ? ?Social Determinants of Health (SDOH) Interventions ?  ? ? ?Readmission Risk Interventions ?No flowsheet data found. ? ? ? ? ?

## 2022-02-11 NOTE — Discharge Instructions (Signed)
Low-Sodium Nutrition Therapy ?Eating less sodium can help you if you have high blood pressure, heart failure, or kidney or liver disease. ?Your body needs a little sodium, but too much sodium can cause your body to hold onto extra water.  ?This extra water will raise your blood pressure and can cause damage to your heart, kidneys, or liver as they are forced to work harder. ?Sometimes you can see how the extra fluid affects you because your hands, legs, or belly swell.  ?You may also hold water around your heart and lungs, which makes it hard to breathe. ?Even if you take medication for blood pressure or a water pill (diuretic) to remove fluid, it is still important to have less salt in your diet. ?Check with your primary care provider before drinking alcohol since it may affect the amount of fluid in your body and how ?your heart, kidneys, or liver work. ? ?Sodium in Food ?A low-sodium meal plan limits the sodium that you get from food and beverages to 1,500-2,000 milligrams (mg) per day. Salt is the main source of sodium.  ?Read the nutrition label on the package to find out how much sodium is in one serving of a food. ?Select foods with 140 milligrams (mg) of sodium or less per serving. ?You may be able to eat one or two servings of foods with a little more than 140 milligrams (mg) of sodium if you are ?closely watching how much sodium you eat in a day. ?Check the serving size on the label. The amount of sodium listed on the label shows the amount in one serving of the ?food. So, if you eat more than one serving, you will get more sodium than the amount listed. ? ?Cutting Back on Sodium ?Eat more fresh foods. ?Fresh fruits and vegetables are low in sodium, as well as frozen vegetables and fruits that have no added juices or sauces. ?Fresh meats are lower in sodium than processed meats, such as bacon, sausage, and hotdogs. ?Not all processed foods are unhealthy, but some processed foods may have too much  sodium. ?Eat less salt at the table and when cooking.  ?One of the ingredients in salt is sodium. ?One teaspoon of table salt has 2,300 milligrams of sodium. ?Leave the salt out of recipes for pasta, casseroles, and soups. ?Be a Paramedic. ?Food packages that say ?Salt-free?, sodium-free?, ?very low sodium,? and ?low sodium? have less than 140 ?milligrams of sodium per serving. ?Beware of products identified as ?Unsalted,? ?No Salt Added,? ?Reduced Sodium,? or ?Lower Sodium.?  ?These items may still be high in sodium. You should always check the nutrition label. ?Add flavors to your food without adding sodium. Try lemon juice, lime juice, or vinegar. Dry or fresh herbs add flavor. ?Buy a sodium-free seasoning blend or make your own at home. ?You can purchase salt-free or sodium-free condiments like barbeque sauce in stores and online.  ? ?Eating in Restaurants ?Choose foods carefully when you eat outside your home. Restaurant foods can be very high in sodium.  ?Many restaurants provide nutrition facts on their menus or their websites. If you cannot find that information, ask your server.  ?Let your server know that you want your food to be cooked without salt and that you would like your salad dressing and sauces to be served on the side. ? ?Foods Recommended ?Grains ?Bread and rolls without salted tops ?Homemade bread made with reduced-sodium baking powder ?Cold cereals, especially shredded wheat and puffed rice ?Oats, grits, or  cream of wheat ?Pastas, quinoa, and rice ?Popcorn, pretzels or crackers without salt ?Corn tortillas ?Protein Foods ?Fresh meats and fish (check the nutrition labels - make sure they are not packaged in a sodium solution) ?Canned or packed tuna (no more than 4 ounces at 1 serving) ?Beans and peas ?Soybeans and tofu ?Eggs ?Nuts or nut butters without salt ?Dairy ?Milk or milk powder ?Plant milks, such as rice and soy ?Yogurt, including Greek yogurt ?Small amounts of natural cheese  (blocks of cheese) or reduced-sodium cheese can be used in moderation.  ?(Swiss, ricotta, and fresh mozzarella cheese are lower in sodium than the others) ?Cream Cheese ?Low sodium cottage cheese ?Vegetables ?Fresh and frozen vegetables without added sauces or salt ?Homemade soups (without salt) ?Low-sodium, salt-free or sodium-free canned vegetables and soups ?Fruit ?Fresh and canned fruits ?Dried fruits, such as raisins, cranberries, and prunes ?Oils ?Tub or liquid margarine, regular or without salt ?Canola, corn, peanut, olive, safflower, or sunflower oils ?Condiments ?Fresh or dried herbs such as basil, bay leaf, dill, mustard (dry), nutmeg, paprika, parsley, rosemary, sage, or thyme. ?Low sodium ketchup ?Vinegar ?Lemon or lime juice ?Pepper, red pepper flakes, and cayenne. ?Hot sauce contains sodium, but if you use just a drop or two, it will not add up to much. ?Salt-free or sodium-free seasoning mixes and marinades ?Simple salad dressings: vinegar and oil ? ?Foods Not Recommended ?Grains ?Breads or crackers topped with salt ?Cereals (hot/cold) with more than 300 mg sodium per serving ?Biscuits, cornbread, and other ?quick? breads prepared with baking soda ?Pre-packaged bread crumbs ?Seasoned and packaged rice and pasta mixes ?Self-rising flours ?Protein Foods ?Cured meats: Bacon, ham, sausage, pepperoni and hot dogs ?Canned meats (chili, vienna sausage, or sardines) ?Smoked fish and meats ?Frozen meals that have more than 600 mg of sodium per serving ?Egg substitute (with added sodium) ?Dairy ?Buttermilk ?Processed cheese spreads ?Cottage cheese (1 cup may have over 500 mg of sodium; look for low-sodium.) ?American or feta cheese ?Shredded cheese has more sodium than blocks of cheese ?String cheese ?Vegetables ?Canned vegetables (unless they are salt-free, sodium-free or low sodium) ?Frozen vegetables with seasoning and sauces ?Sauerkraut and pickled vegetables ?Canned or dried soups (unless they are  salt-free, sodium-free, or low sodium) ?[place] fries and onion rings ?Fruit  ?Dried fruits preserved with additives that have sodium ?Oils  ?Salted butter or margarine, all types of olives ?Condiments ?Salt, sea salt, kosher salt, onion salt, and garlic salt ?Seasoning mixes with salt ?Bouillon cubes ?Ketchup ?Barbeque sauce and Worcestershire sauce unless low sodium ?Soy sauce ?Salsa, pickles, olives, relish ?Salad dressings: ranch, blue cheese, [place], and [place]. ? ? ? ?[place] [facility] Stay ?Proper nutrition can help your body recover from illness and injury.   ?Foods and beverages high in protein, vitamins, and minerals help rebuild muscle loss, promote healing, & reduce fall risk.  ? ?In addition to eating healthy foods, a nutrition shake is an easy, delicious way to get the nutrition you need during and after your hospital stay ? ?It is recommended that you continue to drink 2 bottles per day of:       Ensure or Boost for at least 1 month (30 days) after your hospital stay  ? ?Tips for adding a nutrition shake into your routine: ?As allowed, drink one with vitamins or medications instead of water or juice ?Enjoy one as a tasty mid-morning or afternoon snack ?Drink cold or make a milkshake out of it ?Drink one instead of milk with cereal or snacks ?  Use as a coffee creamer ?  ?Available at the following grocery stores and pharmacies:           ?* [place]  ?* [facility]          * [place]  ?* [facility]      * Target  * [employer]   ?* [facility]  * [employer]   ?[facility] [phone]  ?          ?For COUPONS visit: [email or web address] or [email or web address]  ? ?Suggested Substitutions ?Ensure Plus = Boost Plus = Carnation Breakfast Essentials = Boost Compact ?Ensure Active Clear = Boost Breeze ?Glucerna Shake = Boost Glucose Control = Carnation Breakfast Essentials SUGAR FREE ? ?  ? ?

## 2022-02-11 NOTE — Anesthesia Postprocedure Evaluation (Signed)
Anesthesia Post Note ? ?Patient: Dewell Monnier ? ?Procedure(s) Performed: ENDOBRONCHIAL ULTRASOUND (Bilateral) ?VIDEO BRONCHOSCOPY WITHOUT FLUORO ?BRONCHIAL WASHINGS ?BRONCHIAL NEEDLE ASPIRATION BIOPSIES ?BRONCHIAL BIOPSIES ?BRONCHIAL BRUSHINGS ? ?  ? ?Patient location during evaluation: PACU ?Anesthesia Type: General ?Level of consciousness: awake and alert ?Pain management: pain level controlled ?Vital Signs Assessment: post-procedure vital signs reviewed and stable ?Respiratory status: spontaneous breathing, nonlabored ventilation, respiratory function stable and patient connected to nasal cannula oxygen ?Cardiovascular status: blood pressure returned to baseline and stable ?Postop Assessment: no apparent nausea or vomiting ?Anesthetic complications: no ? ? ?No notable events documented. ? ?Last Vitals:  ?Vitals:  ? 02/11/22 0900 02/11/22 1000  ?BP: (!) 154/115 (!) 139/103  ?Pulse:  84  ?Resp: 20 11  ?Temp:    ?SpO2:  90%  ?  ?Last Pain:  ?Vitals:  ? 02/11/22 0800  ?TempSrc: Oral  ?PainSc:   ? ? ?  ?  ?  ?  ?  ?  ? ?Rohin Krejci L Loletta Harper ? ? ? ? ?

## 2022-02-11 NOTE — Telephone Encounter (Signed)
With pt still admitted to the hospital, appt has been scheduled for pt with Dr. Loanne Drilling for [place]. Appt will show up on pt's AVS once he is discharged from the hospital. If appt needs to be cancelled/rescheduled, pt can call the office. Nothing further needed. ?

## 2022-02-12 ENCOUNTER — Other Ambulatory Visit: Payer: Self-pay

## 2022-02-12 ENCOUNTER — Ambulatory Visit
Admission: RE | Admit: 2022-02-12 | Discharge: 2022-02-12 | Disposition: A | Discharge status: Home / Self Care | Payer: Medicare HMO | Source: Ambulatory Visit | Attending: Radiation Oncology | Admitting: Radiation Oncology

## 2022-02-12 DIAGNOSIS — Z510 Encounter for antineoplastic radiation therapy: Secondary | ICD-10-CM | POA: Diagnosis not present

## 2022-02-12 DIAGNOSIS — F17210 Nicotine dependence, cigarettes, uncomplicated: Secondary | ICD-10-CM | POA: Diagnosis not present

## 2022-02-12 DIAGNOSIS — R918 Other nonspecific abnormal finding of lung field: Secondary | ICD-10-CM | POA: Diagnosis not present

## 2022-02-12 DIAGNOSIS — C3411 Malignant neoplasm of upper lobe, right bronchus or lung: Secondary | ICD-10-CM | POA: Diagnosis not present

## 2022-02-12 LAB — FUNGAL STAIN REFLEX

## 2022-02-12 LAB — FUNGUS STAIN

## 2022-02-13 ENCOUNTER — Ambulatory Visit: Payer: Medicare HMO

## 2022-02-13 ENCOUNTER — Other Ambulatory Visit: Payer: Self-pay

## 2022-02-13 ENCOUNTER — Telehealth: Payer: Self-pay | Admitting: Radiation Oncology

## 2022-02-13 LAB — CULTURE, RESPIRATORY W GRAM STAIN
Culture: NO GROWTH
Gram Stain: NONE SEEN

## 2022-02-13 NOTE — Telephone Encounter (Signed)
Patient stated he could not stay for his treatment appointment 3/3, he needed to be somewhere.  ?

## 2022-02-16 ENCOUNTER — Ambulatory Visit
Admission: RE | Admit: 2022-02-16 | Discharge: 2022-02-16 | Disposition: A | Discharge status: Home / Self Care | Payer: Medicare HMO | Source: Ambulatory Visit | Attending: Radiation Oncology | Admitting: Radiation Oncology

## 2022-02-16 ENCOUNTER — Other Ambulatory Visit: Payer: Self-pay

## 2022-02-16 ENCOUNTER — Telehealth: Payer: Self-pay | Admitting: Hematology

## 2022-02-16 DIAGNOSIS — C3411 Malignant neoplasm of upper lobe, right bronchus or lung: Secondary | ICD-10-CM | POA: Diagnosis not present

## 2022-02-16 DIAGNOSIS — F17210 Nicotine dependence, cigarettes, uncomplicated: Secondary | ICD-10-CM | POA: Diagnosis not present

## 2022-02-16 DIAGNOSIS — R918 Other nonspecific abnormal finding of lung field: Secondary | ICD-10-CM | POA: Diagnosis not present

## 2022-02-16 DIAGNOSIS — Z510 Encounter for antineoplastic radiation therapy: Secondary | ICD-10-CM | POA: Diagnosis not present

## 2022-02-16 LAB — ACID FAST SMEAR (AFB, MYCOBACTERIA): Acid Fast Smear: NEGATIVE

## 2022-02-16 NOTE — Telephone Encounter (Signed)
Scheduled appt per 3/2 referral and 3/1 secure chat with Dr. Irene Limbo. Attempted to contact pt multiple times, but number on file does not work. Attempted to contact pt's wife multiple times, no answer and no vm set up. Mailed updated calendar to pt with appt information.  ?

## 2022-02-17 ENCOUNTER — Ambulatory Visit: Payer: Medicare HMO

## 2022-02-18 ENCOUNTER — Ambulatory Visit: Payer: Medicare HMO

## 2022-02-18 ENCOUNTER — Telehealth (HOSPITAL_COMMUNITY): Payer: Self-pay | Admitting: *Deleted

## 2022-02-18 ENCOUNTER — Ambulatory Visit
Admission: RE | Admit: 2022-02-18 | Discharge: 2022-02-18 | Disposition: A | Discharge status: Home / Self Care | Payer: Medicare HMO | Source: Ambulatory Visit | Attending: Radiation Oncology | Admitting: Radiation Oncology

## 2022-02-18 DIAGNOSIS — C3411 Malignant neoplasm of upper lobe, right bronchus or lung: Secondary | ICD-10-CM | POA: Diagnosis not present

## 2022-02-18 DIAGNOSIS — F17210 Nicotine dependence, cigarettes, uncomplicated: Secondary | ICD-10-CM | POA: Diagnosis not present

## 2022-02-18 DIAGNOSIS — R918 Other nonspecific abnormal finding of lung field: Secondary | ICD-10-CM | POA: Diagnosis not present

## 2022-02-18 DIAGNOSIS — Z510 Encounter for antineoplastic radiation therapy: Secondary | ICD-10-CM | POA: Diagnosis not present

## 2022-02-18 NOTE — Telephone Encounter (Signed)
Attempted to contact patient for follow up from hospital admission.  Unable to reach by any listed number. ?

## 2022-02-19 ENCOUNTER — Other Ambulatory Visit: Payer: Self-pay

## 2022-02-19 ENCOUNTER — Ambulatory Visit: Payer: Medicare HMO

## 2022-02-19 ENCOUNTER — Ambulatory Visit
Admission: RE | Admit: 2022-02-19 | Discharge: 2022-02-19 | Disposition: A | Discharge status: Home / Self Care | Payer: Medicare HMO | Source: Ambulatory Visit | Attending: Radiation Oncology | Admitting: Radiation Oncology

## 2022-02-19 DIAGNOSIS — Z510 Encounter for antineoplastic radiation therapy: Secondary | ICD-10-CM | POA: Diagnosis not present

## 2022-02-19 DIAGNOSIS — F17210 Nicotine dependence, cigarettes, uncomplicated: Secondary | ICD-10-CM | POA: Diagnosis not present

## 2022-02-19 DIAGNOSIS — C3411 Malignant neoplasm of upper lobe, right bronchus or lung: Secondary | ICD-10-CM | POA: Diagnosis not present

## 2022-02-19 DIAGNOSIS — R918 Other nonspecific abnormal finding of lung field: Secondary | ICD-10-CM | POA: Diagnosis not present

## 2022-02-20 ENCOUNTER — Ambulatory Visit
Admission: RE | Admit: 2022-02-20 | Discharge: 2022-02-20 | Disposition: A | Discharge status: Home / Self Care | Payer: Medicare HMO | Source: Ambulatory Visit | Attending: Radiation Oncology | Admitting: Radiation Oncology

## 2022-02-20 ENCOUNTER — Ambulatory Visit: Payer: Medicare HMO

## 2022-02-20 DIAGNOSIS — R918 Other nonspecific abnormal finding of lung field: Secondary | ICD-10-CM | POA: Diagnosis not present

## 2022-02-20 DIAGNOSIS — C3411 Malignant neoplasm of upper lobe, right bronchus or lung: Secondary | ICD-10-CM | POA: Diagnosis not present

## 2022-02-20 DIAGNOSIS — Z510 Encounter for antineoplastic radiation therapy: Secondary | ICD-10-CM | POA: Diagnosis not present

## 2022-02-20 DIAGNOSIS — F17210 Nicotine dependence, cigarettes, uncomplicated: Secondary | ICD-10-CM | POA: Diagnosis not present

## 2022-02-23 ENCOUNTER — Encounter: Payer: Self-pay | Admitting: Radiation Oncology

## 2022-02-23 ENCOUNTER — Other Ambulatory Visit: Payer: Self-pay

## 2022-02-23 ENCOUNTER — Ambulatory Visit: Payer: Medicare HMO

## 2022-02-23 ENCOUNTER — Ambulatory Visit
Admission: RE | Admit: 2022-02-23 | Discharge: 2022-02-23 | Disposition: A | Discharge status: Home / Self Care | Payer: Medicare HMO | Source: Ambulatory Visit | Attending: Radiation Oncology | Admitting: Radiation Oncology

## 2022-02-23 DIAGNOSIS — R918 Other nonspecific abnormal finding of lung field: Secondary | ICD-10-CM | POA: Diagnosis not present

## 2022-02-23 DIAGNOSIS — F17210 Nicotine dependence, cigarettes, uncomplicated: Secondary | ICD-10-CM | POA: Diagnosis not present

## 2022-02-23 DIAGNOSIS — Z510 Encounter for antineoplastic radiation therapy: Secondary | ICD-10-CM | POA: Diagnosis not present

## 2022-02-23 DIAGNOSIS — C3411 Malignant neoplasm of upper lobe, right bronchus or lung: Secondary | ICD-10-CM | POA: Diagnosis not present

## 2022-02-24 ENCOUNTER — Ambulatory Visit: Payer: Medicare HMO

## 2022-02-26 ENCOUNTER — Inpatient Hospital Stay: Payer: Medicare HMO | Admitting: Pulmonary Disease

## 2022-02-26 NOTE — Progress Notes (Deleted)
? ?Subjective:  ? ? Patient ID: Richard [name], male    DOB: 1971-03-12, 51 y.o.   MRN: [id number] ? ?HPI ?02/26/22 ?51 year old male with history of tobacco (50 pack years), COPD/asthma, hypertension, ICH, seizures (2014), prior right lower left extremity DVT (over 10 years ago), alcohol abuse, hypoxemic respiratory failure who presents for follow-up ? ?He was last seen by me in clinic on 04/09/2020 for right-sided lung masses concerning for malignancy.  Unfortunately he was lost to follow-up despite multiple attempts to contact and schedule patient.  In November 2022 he was admitted to [facility] for right-sided weakness however patient was discharged before pulmonary could evaluate him.  Attempts to contact patient was also made at that time without success.  He was seen by in the pulmonary office Dr. [name] on 01/28/2022 and scheduled for EBUS on 02/10/2022.  Right lower lobe brushing suspicious for malignancy however lymph node biopsy of 7 and 4R did not show malignant cells. ? ?Oncology has attempted to schedule. ? ? ?Review of Systems ?As per HPi ? ?Past Medical History:  ?Diagnosis Date  ? Asthma   ? Brain bleed ([place])   ? DVT (deep venous thrombosis) ([place]) 02/19/2015  ? RLE  ? GERD (gastroesophageal reflux disease)   ? History of home oxygen therapy   ? Hypertension   ? Seizures ([place])   ? last episode 03/2013  ? Stroke [facility])   ?  ? ?Family History  ?Problem Relation Age of Onset  ? Cancer Father   ? Diabetes Mellitus II Sister   ? ?No family history lung cancer ? ?Social History  ? ?Socioeconomic History  ? Marital status: Married  ?  Spouse name: Lattie Haw  ? Number of children: 1  ? Years of education: HS  ? Highest education level: Not on file  ?Occupational History  ?  Employer: OTHER  ?  Comment: n/a  ?Tobacco Use  ? Smoking status: Every Day  ?  Packs/day: 1.00  ?  Years: 34.00  ?  Pack years: 34.00  ?  Types: Cigarettes  ?  Start date: 57  ? Smokeless tobacco: Never  ? Tobacco comments:  ?  1 pack smoked daily  ARJ 01/28/22  ?Vaping Use  ? Vaping Use: Never used  ?Substance and Sexual Activity  ? Alcohol use: Yes  ?  Comment: 01/29/22- last time 6 days ago  ? Drug use: Not Currently  ?  Types: Cocaine  ?  Comment: 09/2021- last time for cocaine  ? Sexual activity: Yes  ?Other Topics Concern  ? Not on file  ?Social History Narrative  ? Patient lives at home alone.  ? Caffeine Use: 4-5 cups daily  ? ?Social Determinants of Health  ? ?Financial Resource Strain: Not on file  ?Food Insecurity: Not on file  ?Transportation Needs: Not on file  ?Physical Activity: Not on file  ?Stress: Not on file  ?Social Connections: Not on file  ?Intimate Partner Violence: Not on file  ?  ?His work Architect, Materials engineer, Ambulance person, Biomedical scientist, janitorial ? ? ?Allergies  ?Allergen Reactions  ? Dilaudid [Hydromorphone Hcl] Nausea Only and Other (See Comments)  ?  Bradycardia and hyperthermia, too  ? Tape Itching and Dermatitis  ?  ? ?Outpatient Medications Prior to Visit  ?Medication Sig Dispense Refill  ? Aromatic Inhalants (INHALER DECONGESTANT IN) Inhale 1 spray into the lungs daily as needed (sob/cough).    ? aspirin EC 325 MG EC tablet Take 1 tablet (325 mg total) by mouth daily. Swallow  whole. 90 tablet 2  ? cloNIDine (CATAPRES) 0.3 MG tablet Take 1 tablet (0.3 mg total) by mouth 2 (two) times daily. 60 tablet 0  ? folic acid (FOLVITE) 1 MG tablet Take 1 tablet (1 mg total) by mouth daily. (Patient not taking: Reported on 02/02/2022) 100 tablet 0  ? levETIRAcetam (KEPPRA) 500 MG tablet Take 500 mg by mouth 2 (two) times daily.    ? lidocaine (LIDODERM) 5 % Place 1 patch onto the skin daily. Remove & Discard patch within 12 hours or as directed by MD (Patient not taking: Reported on 02/02/2022) 30 patch 0  ? losartan (COZAAR) 25 MG tablet Take 1 tablet (25 mg total) by mouth daily. 30 tablet 0  ? methocarbamol (ROBAXIN) 500 MG tablet Take 1 tablet (500 mg total) by mouth every 8 (eight) hours as needed for muscle spasms. (Patient not taking:  Reported on 02/02/2022) 15 tablet 0  ? Multiple Vitamin (MULTIVITAMIN WITH MINERALS) TABS tablet Take 1 tablet by mouth daily. (Patient not taking: Reported on 02/02/2022) 100 tablet 0  ? OXYGEN Inhale 2 L/min into the lungs as needed (for shortness of breath).    ? Phenytoin (DILANTIN PO) Take 1 capsule by mouth in the morning and at bedtime.    ? thiamine 100 MG tablet Take 1 tablet (100 mg total) by mouth daily. (Patient not taking: Reported on 02/02/2022) 100 tablet 0  ? Tiotropium Bromide Monohydrate (SPIRIVA RESPIMAT) 2.5 MCG/ACT AERS Inhale 2 puffs into the lungs daily. (Patient not taking: Reported on 01/28/2022) 4 g 6  ? Tiotropium Bromide Monohydrate (SPIRIVA RESPIMAT) 2.5 MCG/ACT AERS Inhale 2 puffs into the lungs daily. (Patient not taking: Reported on 01/28/2022) 4 g 0  ? Tiotropium Bromide-Olodaterol (STIOLTO RESPIMAT) 2.5-2.5 MCG/ACT AERS Inhale 2 puffs into the lungs daily. 4 g 0  ? vitamin B-12 (CYANOCOBALAMIN) 1000 MCG tablet Take 1 tablet (1,000 mcg total) by mouth daily. (Patient not taking: Reported on 02/02/2022) 30 tablet 0  ? ?No facility-administered medications prior to visit.  ? ? ? ? ?   ?Objective:  ? Physical Exam ?There were no vitals filed for this visit. ? ? ?Physical Exam: ?General: Well-appearing, no acute distress ?HENT: [place], AT ?Eyes: EOMI, no scleral icterus ?Respiratory: Clear to auscultation bilaterally.  No crackles, wheezing or rales ?Cardiovascular: RRR, -M/R/G, no JVD ?Extremities:-Edema,-tenderness ?Neuro: AAO x4, CNII-XII grossly intact ?Psych: Normal mood, normal affect ? ?   ?Assessment & Plan:  ? ?No problem-specific Assessment & Plan notes found for this encounter. ? ?51 year old male with history of tobacco (50 pack years), COPD/asthma, hypertension, ICH, seizures (2014), prior right lower left extremity DVT (over 10 years ago), alcohol abuse, hypoxemic respiratory failure who presents for follow-up. ?CT-PA 01/24/2022 large right lung mass transgressing the major fissure  into the mediastinum, 10.6 x 9.7 cm.  There is a 2 x 3 cm satellite mass in the right upper lobe, moderate centrilobular emphysematous changes.  Underwent EBUS 02/10/2022 however nondiagnostic.   ? ?Right lung mass ?--Strongly advised patient to follow-up with oncology as his clinical picture remains highly suggestive for up for malignancy, lung primary. ? ?Emphysema ?--CONTINUE [place] ? ?Rodman Pickle, M.D. ?[facility] ?02/26/2022 8:35 AM  ? ? ? ?

## 2022-03-03 ENCOUNTER — Ambulatory Visit: Payer: Medicare HMO | Admitting: Emergency Medicine

## 2022-03-03 ENCOUNTER — Ambulatory Visit: Payer: Medicare HMO | Admitting: Pulmonary Disease

## 2022-03-04 ENCOUNTER — Ambulatory Visit: Payer: Medicare HMO | Admitting: Emergency Medicine

## 2022-03-04 ENCOUNTER — Other Ambulatory Visit: Payer: Self-pay

## 2022-03-04 ENCOUNTER — Inpatient Hospital Stay: Payer: Medicare HMO | Admitting: Nurse Practitioner

## 2022-03-04 ENCOUNTER — Inpatient Hospital Stay: Payer: Medicare HMO | Attending: Hematology | Admitting: Hematology

## 2022-03-05 ENCOUNTER — Other Ambulatory Visit: Payer: Self-pay

## 2022-03-05 ENCOUNTER — Emergency Department (HOSPITAL_COMMUNITY)
Admission: EM | Admit: 2022-03-05 | Discharge: 2022-03-05 | Discharge status: Against Medical Advice | Payer: Medicare HMO | Attending: Emergency Medicine | Admitting: Emergency Medicine

## 2022-03-05 DIAGNOSIS — Z7982 Long term (current) use of aspirin: Secondary | ICD-10-CM | POA: Diagnosis not present

## 2022-03-05 DIAGNOSIS — R0902 Hypoxemia: Secondary | ICD-10-CM | POA: Diagnosis not present

## 2022-03-05 DIAGNOSIS — R0682 Tachypnea, not elsewhere classified: Secondary | ICD-10-CM | POA: Insufficient documentation

## 2022-03-05 DIAGNOSIS — R000 Tachycardia, unspecified: Secondary | ICD-10-CM | POA: Diagnosis not present

## 2022-03-05 DIAGNOSIS — R0789 Other chest pain: Secondary | ICD-10-CM | POA: Diagnosis not present

## 2022-03-05 DIAGNOSIS — R079 Chest pain, unspecified: Secondary | ICD-10-CM | POA: Insufficient documentation

## 2022-03-05 DIAGNOSIS — R55 Syncope and collapse: Secondary | ICD-10-CM | POA: Insufficient documentation

## 2022-03-05 DIAGNOSIS — R0689 Other abnormalities of breathing: Secondary | ICD-10-CM | POA: Diagnosis not present

## 2022-03-05 DIAGNOSIS — R918 Other nonspecific abnormal finding of lung field: Secondary | ICD-10-CM | POA: Diagnosis not present

## 2022-03-05 NOTE — ED Notes (Signed)
Pt calls wife to take him home. Patient stands up to wheelchair on own ability. Encouraged to stay several more times. I ask this patient to please wait in lobby, at least. Pt insists on sitting outside 'in the sun.' I inform NT in lobby of this patient's status and to keep an eye on him. Agricultural consultant notified of AMA. ?

## 2022-03-05 NOTE — ED Provider Notes (Addendum)
?[facility] ?Provider Note ? ? ?CSN: [id number] ?Arrival date & time: 03/05/22  1711 ? ?  ? ?History ? ?Chief Complaint  ?Patient presents with  ? Shortness of Breath  ? ? ?Richard [name] is a 51 y.o. male. ? ?51 yo M with a chief complaint of a syncopal event.  The patient is unwilling to tell me anything about the history.  Tells me that he just wants to go home and eat something.  Tells me that he has not had anything to eat all day and really wants something to eat.  He thinks that is probably why he passed out.  He tells me that he just was in the hospital for pneumonia and would not like to go back in. ? ? ?Shortness of Breath ? ?  ? ?Home Medications ?Prior to Admission medications   ?Medication Sig Start Date End Date Taking? Authorizing Provider  ?Aromatic Inhalants (INHALER DECONGESTANT IN) Inhale 1 spray into the lungs daily as needed (sob/cough).    [provider]  ?aspirin EC 325 MG EC tablet Take 1 tablet (325 mg total) by mouth daily. Swallow whole. 10/14/21 10/14/22  Pokhrel, Corrie Mckusick, MD  ?cloNIDine (CATAPRES) 0.3 MG tablet Take 1 tablet (0.3 mg total) by mouth 2 (two) times daily. 02/11/22   Florencia Reasons, MD  ?folic acid (FOLVITE) 1 MG tablet Take 1 tablet (1 mg total) by mouth daily. ?Patient not taking: Reported on 02/02/2022 10/15/21   Flora Lipps, MD  ?levETIRAcetam (KEPPRA) 500 MG tablet Take 500 mg by mouth 2 (two) times daily.    [provider]  ?lidocaine (LIDODERM) 5 % Place 1 patch onto the skin daily. Remove & Discard patch within 12 hours or as directed by MD ?Patient not taking: Reported on 02/02/2022 07/11/20   Petrucelli, Aldona Bar R, PA-C  ?losartan (COZAAR) 25 MG tablet Take 1 tablet (25 mg total) by mouth daily. 02/11/22   Florencia Reasons, MD  ?methocarbamol (ROBAXIN) 500 MG tablet Take 1 tablet (500 mg total) by mouth every 8 (eight) hours as needed for muscle spasms. ?Patient not taking: Reported on 02/02/2022 07/11/20   Amaryllis Dyke,  PA-C  ?Multiple Vitamin (MULTIVITAMIN WITH MINERALS) TABS tablet Take 1 tablet by mouth daily. ?Patient not taking: Reported on 02/02/2022 10/15/21   Flora Lipps, MD  ?OXYGEN Inhale 2 L/min into the lungs as needed (for shortness of breath).    [provider]  ?Phenytoin (DILANTIN PO) Take 1 capsule by mouth in the morning and at bedtime.    [provider]  ?thiamine 100 MG tablet Take 1 tablet (100 mg total) by mouth daily. ?Patient not taking: Reported on 02/02/2022 10/15/21   Flora Lipps, MD  ?Tiotropium Bromide Monohydrate (SPIRIVA RESPIMAT) 2.5 MCG/ACT AERS Inhale 2 puffs into the lungs daily. ?Patient not taking: Reported on 01/28/2022 04/09/20   Margaretha Seeds, MD  ?Tiotropium Bromide Monohydrate (SPIRIVA RESPIMAT) 2.5 MCG/ACT AERS Inhale 2 puffs into the lungs daily. ?Patient not taking: Reported on 01/28/2022 04/09/20   Margaretha Seeds, MD  ?Tiotropium Bromide-Olodaterol (STIOLTO RESPIMAT) 2.5-2.5 MCG/ACT AERS Inhale 2 puffs into the lungs daily. 01/28/22   Collene Gobble, MD  ?vitamin B-12 (CYANOCOBALAMIN) 1000 MCG tablet Take 1 tablet (1,000 mcg total) by mouth daily. ?Patient not taking: Reported on 02/02/2022 03/04/20   Patrecia Pour, MD  ?   ? ?Allergies    ?Dilaudid [hydromorphone hcl] and Tape   ? ?Review of Systems   ?Review of Systems  ?  Respiratory:  Positive for shortness of breath.   ? ?Physical Exam ?Updated Vital Signs ?BP (!) 183/138 (BP Location: Left Arm)   Pulse (!) 135   Temp (!) 102.2 ?F (39 ?C) (Oral)   Resp (!) 22   SpO2 100% Comment: 3LPM [place] ?Physical Exam ?Vitals and nursing note reviewed.  ?Constitutional:   ?   Appearance: He is well-developed.  ?HENT:  ?   Head: Normocephalic and atraumatic.  ?Eyes:  ?   Pupils: Pupils are equal, round, and reactive to light.  ?Neck:  ?   Vascular: No JVD.  ?Cardiovascular:  ?   Rate and Rhythm: Regular rhythm. Tachycardia present.  ?   Heart sounds: No murmur heard. ?  No friction rub. No gallop.  ?Pulmonary:  ?    Effort: Tachypnea present. No respiratory distress.  ?   Breath sounds: Decreased breath sounds present. No wheezing.  ?Abdominal:  ?   General: There is no distension.  ?   Tenderness: There is no abdominal tenderness. There is no guarding or rebound.  ?Musculoskeletal:     ?   General: Normal range of motion.  ?   Cervical back: Normal range of motion and neck supple.  ?Skin: ?   Coloration: Skin is not pale.  ?   Findings: No rash.  ?Neurological:  ?   Mental Status: He is alert and oriented to person, place, and time.  ?Psychiatric:     ?   Behavior: Behavior normal.  ? ? ?ED Results / Procedures / Treatments   ?Labs ?(all labs ordered are listed, but only abnormal results are displayed) ?Labs Reviewed - No data to display ? ?EKG ?EKG Interpretation ? ?Date/Time:  Thursday March 05 2022 17:18:30 EDT ?Ventricular Rate:  135 ?PR Interval:  123 ?QRS Duration: 72 ?QT Interval:  286 ?QTC Calculation: 429 ?R Axis:   55 ?Text Interpretation: Sinus tachycardia Probable left atrial enlargement Probable left ventricular hypertrophy st changes likely rate related Otherwise no significant change Confirmed by Deno Etienne [phone]) on 03/05/2022 5:35:17 PM ? ?Radiology ?No results found. ? ?Procedures ?Procedures  ? ? ?Medications Ordered in ED ?Medications - No data to display ? ?ED Course/ Medical Decision Making/ A&P ?  ?                        ?Medical Decision Making ? ?51 yo M with a chief complaints of a syncopal event.  Patient was recently hospitalized for pneumonia.  Is found to be febrile tachycardic and tachypneic.  I discussed further work-up with him but unfortunately he is refusing.  He tells me that he was just in the hospital and does not want to stay.  I offered to give him something to eat and he is declining and would prefer to go home immediately.  I discussed with him about the risk of not fully evaluating him, told him that if he had worsening pneumonia or perhaps a blood clot in the lungs and that  something that could kill him or leave him permanently disabled.  The patient was able to repeat this back to me and understands what I said.  He would like to go home at this time and will try and follow-up with his doctor in the office.  I offered for him to return at any time he changes his mind. ? ?The patient signed out [place]. ? ?5:35 PM:  I have discussed the diagnosis/risks/treatment options with the patient.  Evaluation and diagnostic testing in the emergency department does not suggest an emergent condition requiring admission or immediate intervention beyond what has been performed at this time.  They will follow up with PCP. We also discussed returning to the ED immediately if new or worsening sx occur. We discussed the sx which are most concerning (e.g., sudden worsening pain, fever, inability to tolerate by mouth) that necessitate immediate return. Medications administered to the patient during their visit and any new prescriptions provided to the patient are listed below. ? ?Medications given during this visit ?Medications - No data to display ? ? ?The patient appears reasonably screen and/or stabilized for discharge and I doubt any other medical condition or other [facility] requiring further screening, evaluation, or treatment in the ED at this time prior to discharge.  ? ? ? ? ? ? ? ? ?Final Clinical Impression(s) / ED Diagnoses ?Final diagnoses:  ?Chest pain in adult  ?Syncope and collapse  ? ? ?Rx / DC Orders ?ED Discharge Orders   ? ? None  ? ?  ? ? ? ?  ?Deno Etienne, DO ?03/05/22 1735 ? ?

## 2022-03-05 NOTE — Discharge Instructions (Signed)
I am worried that you could have worsening pneumonia or perhaps a blood clot in your lung.  This is something that could kill you or leave you permanently disabled if its not addressed.  Please return anytime you change your mind and would like [place] to perform an evaluation on you.  Please call your doctor as soon as you can to try and set up an appointment to see them in the office. ?
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
## 2022-03-05 NOTE — ED Notes (Signed)
Pt calling wife to leave. Pt refusing all care. Will not change into gown or allow blood work. Provider at bedside educates patient on risks of leaving. This RN encourages Pt to stay and receive treatment. Pt refuses and becomes angry. Pt is alert and oriented. ?

## 2022-03-05 NOTE — ED Notes (Signed)
Pt refused to dress out in gown. ?

## 2022-03-10 NOTE — Progress Notes (Signed)
? ?                                                                                                                                                          ?  Patient Name: Richard [name] ?MRN: [id number] ?DOB: 1971/01/20 ?Referring Physician: Terrilee Croak ?Date of Service: 02/23/2022 ?[facility] [place] ? ?                                                      End Of Treatment Note ? ?Diagnoses: C34.31-Malignant neoplasm of lower lobe, right bronchus or lung ? ?Cancer Staging:  Probable Stage IIIB, cT4N2M0, NSCLC, of the RUL with overlap into the RLL. ? ?Intent: Palliative ? ?Radiation Treatment Dates:  ?02/04/2022 through 02/23/2022 ?Site Technique Total Dose (Gy) Dose per Fx (Gy) Completed Fx Beam Energies  ?Lung, Right: Lung_R 3D 30/30 3 10/10 6X  ? ?Narrative: The patient tolerated radiation therapy relatively well. His cytology was suspicious for malignancy in the RLL brushing specimen from 02/10/22 and he completed his radiation, however he does not have any scheduled follow up with medical oncology. He was recently seen in the ED and left AMA. ? ?Plan: The patient will receive a call in about one month from the radiation oncology department. He will continue follow up with Dr. Irene Limbo as well.  ?________________________________________________ ? ? ? ?Carola Rhine, PAC  ?

## 2022-03-17 LAB — FUNGUS CULTURE WITH STAIN

## 2022-03-17 LAB — FUNGAL ORGANISM REFLEX

## 2022-03-17 LAB — FUNGUS CULTURE RESULT

## 2022-03-23 ENCOUNTER — Ambulatory Visit
Admission: RE | Admit: 2022-03-23 | Discharge: 2022-03-23 | Disposition: A | Discharge status: Home / Self Care | Payer: Medicare HMO | Source: Ambulatory Visit | Attending: Radiation Oncology | Admitting: Radiation Oncology

## 2022-03-23 DIAGNOSIS — C3481 Malignant neoplasm of overlapping sites of right bronchus and lung: Secondary | ICD-10-CM

## 2022-03-23 NOTE — Progress Notes (Signed)
?  Radiation Oncology         [phone] ?________________________________ ? ?Name: Richard [name] MRN: [id number]  ?Date of Service: 03/23/2022  DOB: [birth year] ? ?Post Treatment Telephone Note ? ?Diagnosis:   Probable Stage IIIB, cT4N2M0, NSCLC, of the RUL with overlap into the RLL though endobronchial brushing only showed suspicious for malignancy. ? ?Intent: Palliative ? ?Radiation Treatment Dates:  ?02/04/2022 through 02/23/2022 ?Site Technique Total Dose (Gy) Dose per Fx (Gy) Completed Fx Beam Energies  ?Lung, Right: Lung_R 3D 30/30 3 10/10 6X  ? ?Narrative: The patient tolerated radiation therapy relatively well. His cytology was suspicious for malignancy in the RLL brushing specimen from 02/10/22 and he completed his radiation, however he does not have any scheduled follow up with medical oncology. He was recently seen in the ED and left AMA. He has also no showed for appointments in medical oncology and palliative medicine. ? ? ? ? ?Impression/Plan: ?1.  Probable Stage IIIB, cT4N2M0, NSCLC, of the RUL with overlap into the RLL though endobronchial brushing only showed suspicious for malignancy.  I was unable to reach the patient but   we would be happy to continue to follow him as needed. He really needs follow up with medical oncology but it appears he's been noncompliant in getting back for his care. ? ? ? ? ?Carola Rhine, PAC  ? ? ? ? ?

## 2022-03-27 ENCOUNTER — Ambulatory Visit: Payer: Medicare HMO | Admitting: Emergency Medicine

## 2022-03-30 LAB — ACID FAST CULTURE WITH REFLEXED SENSITIVITIES (MYCOBACTERIA): Acid Fast Culture: NEGATIVE

## 2022-04-09 DIAGNOSIS — Z20822 Contact with and (suspected) exposure to covid-19: Secondary | ICD-10-CM | POA: Diagnosis not present

## 2022-05-28 ENCOUNTER — Ambulatory Visit: Payer: Medicare Other | Admitting: Emergency Medicine

## 2022-06-03 ENCOUNTER — Ambulatory Visit (INDEPENDENT_AMBULATORY_CARE_PROVIDER_SITE_OTHER): Payer: Medicare Other | Admitting: Adult Health

## 2022-06-03 ENCOUNTER — Encounter: Payer: Self-pay | Admitting: Adult Health

## 2022-06-03 ENCOUNTER — Ambulatory Visit: Payer: Medicare Other | Admitting: Nurse Practitioner

## 2022-06-03 VITALS — BP 160/86 | HR 76 | Temp 97.7°F | Ht 69.5 in | Wt 205.2 lb

## 2022-06-03 DIAGNOSIS — J9611 Chronic respiratory failure with hypoxia: Secondary | ICD-10-CM

## 2022-06-03 DIAGNOSIS — R918 Other nonspecific abnormal finding of lung field: Secondary | ICD-10-CM

## 2022-06-03 DIAGNOSIS — C3481 Malignant neoplasm of overlapping sites of right bronchus and lung: Secondary | ICD-10-CM | POA: Diagnosis not present

## 2022-06-03 DIAGNOSIS — R21 Rash and other nonspecific skin eruption: Secondary | ICD-10-CM

## 2022-06-03 DIAGNOSIS — J439 Emphysema, unspecified: Secondary | ICD-10-CM

## 2022-06-03 DIAGNOSIS — F17200 Nicotine dependence, unspecified, uncomplicated: Secondary | ICD-10-CM

## 2022-06-03 LAB — COMPREHENSIVE METABOLIC PANEL
ALT: 15 U/L (ref 0–53)
AST: 18 U/L (ref 0–37)
Albumin: 4.1 g/dL (ref 3.5–5.2)
Alkaline Phosphatase: 85 U/L (ref 39–117)
BUN: 6 mg/dL (ref 6–23)
CO2: 29 mEq/L (ref 19–32)
Calcium: 9.6 mg/dL (ref 8.4–10.5)
Chloride: 104 mEq/L (ref 96–112)
Creatinine, Ser: 0.9 mg/dL (ref 0.40–1.50)
GFR: 98.72 mL/min (ref >60.00)
Glucose, Bld: 82 mg/dL (ref 70–99)
Potassium: 4.2 mEq/L (ref 3.5–5.1)
Sodium: 139 mEq/L (ref 135–145)
Total Bilirubin: 0.9 mg/dL (ref 0.2–1.2)
Total Protein: 7.7 g/dL (ref 6.0–8.3)

## 2022-06-03 MED ORDER — ALBUTEROL SULFATE HFA 108 (90 BASE) MCG/ACT IN AERS: 1.0000 | INHALATION_SPRAY | Freq: Four times a day (QID) | RESPIRATORY_TRACT | 3 refills | Status: DC | PRN

## 2022-06-03 MED ORDER — STIOLTO RESPIMAT 2.5-2.5 MCG/ACT IN AERS: 2.0000 | INHALATION_SPRAY | Freq: Every day | RESPIRATORY_TRACT | 5 refills | Status: DC

## 2022-06-03 NOTE — Patient Instructions (Addendum)
Set up CT chest w/ contrast  Refer to Oncology -lung cancer  Work on not smoking  Restart Stiolto 2 puffs daily  Albuterol inhaler As needed   Mucinex DM Twice daily  As needed  cough/congestion  Continue on Oxygen 3l/m  Labs today  Refer to dermatology -hand/foot rash  Follow up with Dr. [name]  in 3-4 weeks with PFTs and As needed   Please contact office for sooner follow up if symptoms do not improve or worsen or seek emergency care

## 2022-06-03 NOTE — Progress Notes (Signed)
@Patient  ID: Richard [name], male    DOB: [birth year], 51 y.o.   MRN: [id number]     Referring provider: [facility], [facility]  HPI: 51 year old active smoker seen for initial pulmonary consult April 09, 2020 right lung mass consistent with lung cancer Medical history significant for COPD, seizures, right lower lobe extremity DVT, alcohol abuse  TEST/EVENTS :  CTA 03/04/20 - No pulmonary embolus. 2.6cm mass in superior segment of the RLL. RUL nodule measured at 1.2 cm. Background emphysema.  PET scan Apr 15, 2020 showed hypermetabolic right lung nodules most indicative of a synchronous primary bronchogenic carcinoma with nodules compatible with stage Ia lesions no evidence of metastatic disease  CT chest October 12, 2021 showed a 7.5 cm central/perihilar right lung malignancy involving both the right upper and right lower lobes with some extension into the mediastinum, 2.5 cm satellite mass in the right upper lobe, mildly enlarged right hilar, right paratracheal and subcarinal lymph nodes likely representing lymphatic spread  MRI brain October 13, 2021 negative for infarct  CT chest January 24, 2022 negative for PE, 10.6 cm right lung cancer with mediastinal invasion and a 3.0 cm satellite mass in the right upper lobe progressive from previous CT  MRI brain February 03, 2022 negative evidence of metastatic disease in the brain  PET scan February 04, 2022 with a hypermetabolic mass in the posterior right upper lobe measuring 7.3 x 9.1 cm with SUV max at 15.5, spiculated nodule right upper lobe measuring 2.0 x 2.7 cm with SUV max at 14.9, right hilar hypermetabolism suv max 4.0, ipsilateral mediastinal/hilar and subcarinal hypermetabolic lymph nodes, narrowing/obstruction of the right upper lobe bronchus and bronchus intermedius with associated volume loss in the right middle and right lower lobes, small right pleural effusion   06/03/2022 Follow up; Lung cancer  Patient presents for a  follow-up visit.  Patient was initially seen in April 2021 for abnormal CT chest with right lung mass suspicious for underlying lung cancer.  Patient was set up for a PET scan that showed hypermetabolic right lung nodules most indicative of a synchronous primary bronchogenic carcinoma.  Patient was recommended for tissue sampling with bronchoscopy.  Patient was contacted by phone and by mail and felt to follow back to the office.  Patient was admitted October 2022 at that time there was significant interval growth of the right upper lobe nodule at 7.5 cm with extension into the mediastinum.  Patient again did not follow-up.  Patient was readmitted February 2023 with acute respiratory failure and hypoxemia.  CT chest was negative for PE.  Showed interval lung mass growth at 10.  6 cm with mediastinal invasion and a 3.0 cm satellite mass in the right upper lobe progressive from previous CT chest.  Patient underwent EBUS and endobronchial biopsies and brushings.  He was treated for postobstructive pneumonia with 10 days of antibiotics.  Surgical path was nondiagnostic with negative malignant cells.  Cytology was nondiagnostic , did show suspicious cells for malignancy.  Patient was seen by radiation oncology and started on radiation therapy February 22 through February 23, 2022 Patient was seen by oncology during hospitalization recommended for outpatient follow-up. With recommendations for systemic therapy once able to re biopsy. Unfortunately patient did not follow-up with outpatient oncology.  Patient did not follow-up with pulmonary nor radiation oncology as recommended. Patient presents today and says that he continues to have ongoing cough congestion and thick mucus.  Last visit patient was started on Stiolto but says he only  took this for short period of time.  Says he did not go to the pharmacy to pick up prescription.  Patient also complains that he has had a new onset rash with peeling fluid-filled vesicles  along his hands and feet.  He denies any painful urination.  Patient continues to smoke.  We discussed smoking cessation.  Patient does have a history of alcohol abuse.  Toxicology report in October showed positive cocaine use.  Patient says he is not using drugs currently. We discussed today that he needs to have follow-up CT chest to evaluate current status of his underlying lung cancer.  Patient says his weight has been steady.  Hepatitis been good.  He has had no additional weight loss.  No hemoptysis no chest pain or orthopnea. We discussed the importance of keeping his follow-up appointments Remains on Oxygen 3l/m with activity and At bedtime  . Wears at home . Does not have portable system.    Allergies  Allergen Reactions   Dilaudid [Hydromorphone Hcl] Nausea Only and Other (See Comments)    Bradycardia and hyperthermia, too   Tape Itching and Dermatitis    Immunization History  Administered Date(s) Administered   Influenza Whole 11/13/2010, 09/04/2019   Pneumococcal Polysaccharide-23 02/15/2015    Past Medical History:  Diagnosis Date   Asthma    Brain bleed ([place])    DVT (deep venous thrombosis) ([place]) 02/19/2015   RLE   GERD (gastroesophageal reflux disease)    History of home oxygen therapy    Hypertension    Seizures ([place])    last episode 03/2013   Stroke ([place])     Tobacco History: Social History   Tobacco Use  Smoking Status Every Day   Packs/day: 1.00   Years: 34.00   Total pack years: 34.00   Types: Cigarettes   Start date: 1987  Smokeless Tobacco Never  Tobacco Comments   1 pack smoked daily [facility]J 01/28/22   Ready to quit: Not Answered Counseling given: Not Answered Tobacco comments: 1 pack smoked daily ARJ 01/28/22   Outpatient Medications Prior to Visit  Medication Sig Dispense Refill   aspirin EC 325 MG EC tablet Take 1 tablet (325 mg total) by mouth daily. Swallow whole. 90 tablet 2   OXYGEN Inhale 2 L/min into the lungs as needed (for shortness  of breath).     Aromatic Inhalants (INHALER DECONGESTANT IN) Inhale 1 spray into the lungs daily as needed (sob/cough). (Patient not taking: Reported on 06/03/2022)     cloNIDine (CATAPRES) 0.3 MG tablet Take 1 tablet (0.3 mg total) by mouth 2 (two) times daily. (Patient not taking: Reported on 06/03/2022) 60 tablet 0   folic acid (FOLVITE) 1 MG tablet Take 1 tablet (1 mg total) by mouth daily. (Patient not taking: Reported on 02/02/2022) 100 tablet 0   levETIRAcetam (KEPPRA) 500 MG tablet Take 500 mg by mouth 2 (two) times daily. (Patient not taking: Reported on 06/03/2022)     lidocaine (LIDODERM) 5 % Place 1 patch onto the skin daily. Remove & Discard patch within 12 hours or as directed by MD (Patient not taking: Reported on 02/02/2022) 30 patch 0   losartan (COZAAR) 25 MG tablet Take 1 tablet (25 mg total) by mouth daily. (Patient not taking: Reported on 06/03/2022) 30 tablet 0   methocarbamol (ROBAXIN) 500 MG tablet Take 1 tablet (500 mg total) by mouth every 8 (eight) hours as needed for muscle spasms. (Patient not taking: Reported on 02/02/2022) 15 tablet 0   Multiple  Vitamin (MULTIVITAMIN WITH MINERALS) TABS tablet Take 1 tablet by mouth daily. (Patient not taking: Reported on 02/02/2022) 100 tablet 0   Phenytoin (DILANTIN PO) Take 1 capsule by mouth in the morning and at bedtime. (Patient not taking: Reported on 06/03/2022)     thiamine 100 MG tablet Take 1 tablet (100 mg total) by mouth daily. (Patient not taking: Reported on 02/02/2022) 100 tablet 0   Tiotropium Bromide Monohydrate (SPIRIVA RESPIMAT) 2.5 MCG/ACT AERS Inhale 2 puffs into the lungs daily. (Patient not taking: Reported on 01/28/2022) 4 g 6   Tiotropium Bromide Monohydrate (SPIRIVA RESPIMAT) 2.5 MCG/ACT AERS Inhale 2 puffs into the lungs daily. (Patient not taking: Reported on 01/28/2022) 4 g 0   Tiotropium Bromide-Olodaterol (STIOLTO RESPIMAT) 2.5-2.5 MCG/ACT AERS Inhale 2 puffs into the lungs daily. (Patient not taking: Reported on  06/03/2022) 4 g 0   vitamin B-12 (CYANOCOBALAMIN) 1000 MCG tablet Take 1 tablet (1,000 mcg total) by mouth daily. (Patient not taking: Reported on 06/03/2022) 30 tablet 0   No facility-administered medications prior to visit.     Review of Systems:   Constitutional:   No  weight loss, night sweats,  Fevers, chills,  +fatigue, or  lassitude.  HEENT:   No headaches,  Difficulty swallowing,  Tooth/dental problems, or  Sore throat,                No sneezing, itching, ear ache, nasal congestion, post nasal drip,   CV:  No chest pain,  Orthopnea, PND, swelling in lower extremities, anasarca, dizziness, palpitations, syncope.   GI  No heartburn, indigestion, abdominal pain, nausea, vomiting, diarrhea, change in bowel habits, loss of appetite, bloody stools.   Resp: No chest wall deformity  Skin: +rash along hands and feet   GU: no dysuria, change in color of urine, no urgency or frequency.  No flank pain, no hematuria   MS:  No joint pain or swelling.  No decreased range of motion.  No back pain.    Physical Exam  BP (!) 160/86 (BP Location: Left Arm, Cuff Size: Normal)   Pulse 76   Temp 97.7 F (36.5 C) (Temporal)   Ht 5' 9.5" (1.765 m)   Wt 205 lb 3.2 oz (93.1 kg)   SpO2 90%   BMI 29.87 kg/m   GEN: A/Ox3; pleasant , NAD, well nourished    HEENT:  [place]/AT,   NOSE-clear, THROAT-clear, no lesions, no postnasal drip or exudate noted.   NECK:  Supple w/ fair ROM; no JVD; normal carotid impulses w/o bruits; no thyromegaly or nodules palpated; no lymphadenopathy.    RESP  scattered rhonchi with  no accessory muscle use, no dullness to percussion  CARD:  RRR, no m/r/g, no peripheral edema, pulses intact, no cyanosis or clubbing.  GI:   Soft & nt; nml bowel sounds; no organomegaly or masses detected.   Musco: Warm bil, no deformities or joint swelling noted.   Neuro: alert, no focal deficits noted.    Skin: Warm, scattered vesicles along hand and feet  some scaling and fluid  filled vesicles.     Lab Results:  CBC    Component Value Date/Time   WBC 8.3 02/10/2022 0308   RBC 4.91 02/10/2022 0308   HGB 14.4 02/10/2022 0308   HCT 45.5 02/10/2022 0308   PLT 325 02/10/2022 0308   MCV 92.7 02/10/2022 0308   MCH 29.3 02/10/2022 0308   MCHC 31.6 02/10/2022 0308   RDW 14.8 02/10/2022 0308   LYMPHSABS 1.0 02/02/2022  1401   MONOABS 0.9 02/02/2022 1401   EOSABS 0.0 02/02/2022 1401   BASOSABS 0.0 02/02/2022 1401    BMET    Component Value Date/Time   NA 135 02/10/2022 0308   K 4.0 02/10/2022 0308   CL 99 02/10/2022 0308   CO2 30 02/10/2022 0308   GLUCOSE 117 (H) 02/10/2022 0308   BUN 21 (H) 02/10/2022 0308   CREATININE 0.76 02/10/2022 0308   CREATININE 0.91 12/05/2013 1605   CALCIUM 9.4 02/10/2022 0308   GFRNONAA >60 02/10/2022 0308   GFRNONAA >89 12/05/2013 1605   GFRAA >60 05/09/2020 0108   GFRAA >89 12/05/2013 1605    BNP    Component Value Date/Time   BNP 21.5 02/04/2022 0630    ProBNP    Component Value Date/Time   PROBNP 36.0 08/15/2014 1244    Imaging: No results found.        No data to display          No results found for: "NITRICOXIDE"      Assessment & Plan:   No problem-specific Assessment & Plan notes found for this encounter.     [name] Edison, NP 06/03/2022

## 2022-06-04 ENCOUNTER — Telehealth: Payer: Self-pay | Admitting: *Deleted

## 2022-06-04 DIAGNOSIS — J9611 Chronic respiratory failure with hypoxia: Secondary | ICD-10-CM | POA: Insufficient documentation

## 2022-06-04 DIAGNOSIS — R21 Rash and other nonspecific skin eruption: Secondary | ICD-10-CM | POA: Insufficient documentation

## 2022-06-04 LAB — RPR: RPR Ser Ql: NONREACTIVE

## 2022-06-04 LAB — HIV ANTIBODY (ROUTINE TESTING W REFLEX): HIV 1&2 Ab, 4th Generation: NONREACTIVE

## 2022-06-04 NOTE — Telephone Encounter (Signed)
ATC patient to make him aware of change in date of pft and f/u with Dr. [name], cell number rang, no answer and no vm.  Called home #, rang, no answer and no vm.  Called [phone] and left a vm for patient to return call.  When patient returns call please advise him of the change of his appointments:  CT scan 06/10/2022 at 12:30 pm, to arrive by 12:15 pm.  Follow up with Dr. [name] 06/24/22 at 10:45 am, arrive by 10:30 am for check in.  07/09/2022 pulmonary function test (PFT) 11:00 am, arrive by 10:45 am.  [name] Edison NP wanted the CT scan, breating test and follow up with Dr. [name] sooner than what was scheduled before you left the office.  I am also mailing a copy to the patient's home address.

## 2022-06-04 NOTE — Assessment & Plan Note (Signed)
Presumed progressive lung cancer first identified in March 2021 with progressive interval growth.  Hyper metabolic nodules on PET scan May 2021 and February 2023.  Bronchoscopy cytology February 2023 suspicious for malignancy.  Radiation oncology and oncology evaluations with presumed lung cancer with palliative radiation from February 22, 202February 22, 2023 to February 23, 2022.  Unfortunately patient failed to follow-up on multiple occasions.  Long discussion regarding follow-up compliance.  We will set patient up with CT chest with contrast.  Placed referral back to oncology Dr. Irene Limbo .  Follow back with radiation oncology. We will set up for PFTs.  Previously had felt that he would undergo systemic therapy if able to rebiopsy  Plan  Patient Instructions  Set up CT chest w/ contrast  Refer to Oncology -lung cancer  Work on not smoking  Restart Stiolto 2 puffs daily  Albuterol inhaler As needed   Mucinex DM Twice daily  As needed  cough/congestion  Continue on Oxygen 3l/m  Labs today  Refer to dermatology -hand/foot rash  Follow up with Dr. [name]  in 3-4 weeks with PFTs and As needed   Please contact office for sooner follow up if symptoms do not improve or worsen or seek emergency care

## 2022-06-04 NOTE — Assessment & Plan Note (Signed)
Presumed COPD with heavy smoking history.  Patient is to restart Stiolto.  Check PFTs on return. Smoking cessation discussed  Plan  Patient Instructions  Set up CT chest w/ contrast  Refer to Oncology -lung cancer  Work on not smoking  Restart Stiolto 2 puffs daily  Albuterol inhaler As needed   Mucinex DM Twice daily  As needed  cough/congestion  Continue on Oxygen 3l/m  Labs today  Refer to dermatology -hand/foot rash  Follow up with Dr. [name]  in 3-4 weeks with PFTs and As needed   Please contact office for sooner follow up if symptoms do not improve or worsen or seek emergency care

## 2022-06-04 NOTE — Assessment & Plan Note (Signed)
Continue on oxygen 3 L to maintain O2 saturations greater than 88 to 90%

## 2022-06-04 NOTE — Assessment & Plan Note (Signed)
Smoking cessation discussed 

## 2022-06-04 NOTE — Assessment & Plan Note (Signed)
Fluid-filled vesicles with scaling along hands and feet questionable etiology.  Will refer to dermatology.  Check RPR.  Plan  Patient Instructions  Set up CT chest w/ contrast  Refer to Oncology -lung cancer  Work on not smoking  Restart Stiolto 2 puffs daily  Albuterol inhaler As needed   Mucinex DM Twice daily  As needed  cough/congestion  Continue on Oxygen 3l/m  Labs today  Refer to dermatology -hand/foot rash  Follow up with Dr. [name]  in 3-4 weeks with PFTs and As needed   Please contact office for sooner follow up if symptoms do not improve or worsen or seek emergency care

## 2022-06-05 ENCOUNTER — Telehealth: Payer: Self-pay | Admitting: Hematology

## 2022-06-10 ENCOUNTER — Ambulatory Visit (HOSPITAL_COMMUNITY)
Admission: RE | Admit: 2022-06-10 | Discharge: 2022-06-10 | Disposition: A | Discharge status: Home / Self Care | Payer: Medicare Other | Source: Ambulatory Visit | Attending: Adult Health | Admitting: Adult Health

## 2022-06-10 DIAGNOSIS — R918 Other nonspecific abnormal finding of lung field: Secondary | ICD-10-CM | POA: Insufficient documentation

## 2022-06-10 MED ORDER — IOHEXOL 300 MG/ML  SOLN
75.0000 mL | Freq: Once | INTRAMUSCULAR | Status: AC | PRN
  Administered 2022-06-10: 75 mL via INTRAVENOUS

## 2022-06-10 MED ORDER — SODIUM CHLORIDE (PF) 0.9 % IJ SOLN
INTRAMUSCULAR | Status: AC
  Filled 2022-06-10: qty 50

## 2022-06-17 NOTE — Progress Notes (Signed)
Called and spoke with patient, advised of results/recommendations per Tammy Parrett NP.  He verbalized understanding.  Nothing further needed.

## 2022-06-23 ENCOUNTER — Other Ambulatory Visit (HOSPITAL_COMMUNITY): Payer: Medicare Other

## 2022-06-24 ENCOUNTER — Ambulatory Visit: Payer: Medicare Other | Admitting: Emergency Medicine

## 2022-07-01 ENCOUNTER — Ambulatory Visit: Payer: Medicare Other | Admitting: Emergency Medicine

## 2022-07-08 ENCOUNTER — Inpatient Hospital Stay: Payer: Medicare Other | Attending: Hematology | Admitting: Hematology

## 2022-07-13 ENCOUNTER — Telehealth: Payer: Self-pay | Admitting: Hematology

## 2022-07-13 NOTE — Telephone Encounter (Signed)
R/s pt's missed appt. Spoke to pt and let him know per our no show policy this would be our last time r/s his appt. He verbalized understanding. Mailed updated calendar to pt as well per pt request.

## 2022-07-22 ENCOUNTER — Ambulatory Visit: Payer: Medicare Other | Admitting: Emergency Medicine

## 2022-07-29 ENCOUNTER — Emergency Department (HOSPITAL_COMMUNITY)
Admission: EM | Admit: 2022-07-29 | Discharge: 2022-07-29 | Discharge status: Against Medical Advice | Payer: Medicare Other | Attending: Emergency Medicine | Admitting: Emergency Medicine

## 2022-07-29 ENCOUNTER — Emergency Department (HOSPITAL_COMMUNITY): Payer: Medicare Other

## 2022-07-29 DIAGNOSIS — R4020 Unspecified coma: Secondary | ICD-10-CM

## 2022-07-29 DIAGNOSIS — Z85118 Personal history of other malignant neoplasm of bronchus and lung: Secondary | ICD-10-CM | POA: Insufficient documentation

## 2022-07-29 DIAGNOSIS — I10 Essential (primary) hypertension: Secondary | ICD-10-CM | POA: Insufficient documentation

## 2022-07-29 DIAGNOSIS — R55 Syncope and collapse: Secondary | ICD-10-CM | POA: Insufficient documentation

## 2022-07-29 DIAGNOSIS — Z7982 Long term (current) use of aspirin: Secondary | ICD-10-CM | POA: Diagnosis not present

## 2022-07-29 DIAGNOSIS — Z79899 Other long term (current) drug therapy: Secondary | ICD-10-CM | POA: Insufficient documentation

## 2022-07-29 LAB — CBG MONITORING, ED: Glucose-Capillary: 99 mg/dL (ref 70–99)

## 2022-07-29 MED ORDER — HALOPERIDOL LACTATE 5 MG/ML IJ SOLN: 5.0000 mg | Freq: Once | INTRAMUSCULAR | Status: DC

## 2022-07-29 NOTE — ED Provider Notes (Signed)
[facility] EMERGENCY DEPARTMENT Provider Note   CSN: [id number] Arrival date & time: 07/29/22  2115     History {Add pertinent medical, surgical, social history, OB history to HPI:1} No chief complaint on file.   Richard [name] is a 51 y.o. male.  HPI     Home Medications Prior to Admission medications   Medication Sig Start Date End Date Taking? Authorizing Provider  albuterol (VENTOLIN HFA) 108 (90 Base) MCG/ACT inhaler Inhale 1-2 puffs into the lungs every 6 (six) hours as needed for wheezing or shortness of breath. 06/03/22   Parrett, Fonnie Mu, NP  Aromatic Inhalants (INHALER DECONGESTANT IN) Inhale 1 spray into the lungs daily as needed (sob/cough). Patient not taking: Reported on 06/03/2022    [provider]  aspirin EC 325 MG EC tablet Take 1 tablet (325 mg total) by mouth daily. Swallow whole. 10/14/21 10/14/22  Pokhrel, Corrie Mckusick, MD  cloNIDine (CATAPRES) 0.3 MG tablet Take 1 tablet (0.3 mg total) by mouth 2 (two) times daily. Patient not taking: Reported on 06/03/2022 02/11/22   Florencia Reasons, MD  folic acid (FOLVITE) 1 MG tablet Take 1 tablet (1 mg total) by mouth daily. Patient not taking: Reported on 02/02/2022 10/15/21   Flora Lipps, MD  levETIRAcetam (KEPPRA) 500 MG tablet Take 500 mg by mouth 2 (two) times daily. Patient not taking: Reported on 06/03/2022    [provider]  lidocaine (LIDODERM) 5 % Place 1 patch onto the skin daily. Remove & Discard patch within 12 hours or as directed by MD Patient not taking: Reported on 02/02/2022 07/11/20   Petrucelli, Aldona Bar R, PA-C  losartan (COZAAR) 25 MG tablet Take 1 tablet (25 mg total) by mouth daily. Patient not taking: Reported on 06/03/2022 02/11/22   Florencia Reasons, MD  methocarbamol (ROBAXIN) 500 MG tablet Take 1 tablet (500 mg total) by mouth every 8 (eight) hours as needed for muscle spasms. Patient not taking: Reported on 02/02/2022 07/11/20   Petrucelli, Aldona Bar R, PA-C  Multiple Vitamin  (MULTIVITAMIN WITH MINERALS) TABS tablet Take 1 tablet by mouth daily. Patient not taking: Reported on 02/02/2022 10/15/21   Flora Lipps, MD  OXYGEN Inhale 2 L/min into the lungs as needed (for shortness of breath).    [provider]  Phenytoin (DILANTIN PO) Take 1 capsule by mouth in the morning and at bedtime. Patient not taking: Reported on 06/03/2022    [provider]  thiamine 100 MG tablet Take 1 tablet (100 mg total) by mouth daily. Patient not taking: Reported on 02/02/2022 10/15/21   Flora Lipps, MD  Tiotropium Bromide Monohydrate (SPIRIVA RESPIMAT) 2.5 MCG/ACT AERS Inhale 2 puffs into the lungs daily. Patient not taking: Reported on 01/28/2022 04/09/20   Margaretha Seeds, MD  Tiotropium Bromide Monohydrate (SPIRIVA RESPIMAT) 2.5 MCG/ACT AERS Inhale 2 puffs into the lungs daily. Patient not taking: Reported on 01/28/2022 04/09/20   Margaretha Seeds, MD  Tiotropium Bromide-Olodaterol (STIOLTO RESPIMAT) 2.5-2.5 MCG/ACT AERS Inhale 2 puffs into the lungs daily. 06/03/22   Parrett, Fonnie Mu, NP  vitamin B-12 (CYANOCOBALAMIN) 1000 MCG tablet Take 1 tablet (1,000 mcg total) by mouth daily. Patient not taking: Reported on 06/03/2022 03/04/20   Patrecia Pour, MD      Allergies    Dilaudid [hydromorphone hcl] and Tape    Review of Systems   Review of Systems  Physical Exam Updated Vital Signs There were no vitals taken for this visit. Physical Exam  ED Results / Procedures / Treatments  Labs (all labs ordered are listed, but only abnormal results are displayed) Labs Reviewed - No data to display  EKG None  Radiology No results found.  Procedures Procedures  {Document cardiac monitor, telemetry assessment procedure when appropriate:1}  Medications Ordered in ED Medications - No data to display  ED Course/ Medical Decision Making/ A&P                           Medical Decision Making  ***  {Document critical care time when appropriate:1} {Document  review of labs and clinical decision tools ie heart score, Chads2Vasc2 etc:1}  {Document your independent review of radiology images, and any outside records:1} {Document your discussion with family members, caretakers, and with consultants:1} {Document social determinants of health affecting pt's care:1} {Document your decision making why or why not admission, treatments were needed:1} Final Clinical Impression(s) / ED Diagnoses Final diagnoses:  None    Rx / DC Orders ED Discharge Orders     None

## 2022-07-29 NOTE — Discharge Instructions (Signed)
Your blood glucose was normal today.  You had episodes of unresponsiveness today but are currently alert and oriented and able to make your own decisions.  You have capacity to make those decisions.  We discussed the potential concern for potential metastatic disease to your brain and need for potential CT imaging to further evaluate your episodes of loss of consciousness and possible seizure-like activity today.  Despite the request to stay for further work-up, you made the decision to leave [facility].

## 2022-07-29 NOTE — ED Notes (Signed)
EDP at bedside, Pt wanting to leave. Pt is alert and oriented x4 with a normal neurological exam. EDP agrees to [facility]

## 2022-07-29 NOTE — ED Triage Notes (Signed)
Pt BIB EMS for altered mental status and syncapal episodes. Pt had 5 episodes with EMS and had repetitive questions after coming to. 3L base line 78%, bumped to 6L and improved to 95%    Hx lung disease    128/97

## 2022-08-05 ENCOUNTER — Telehealth: Payer: Self-pay | Admitting: Hematology

## 2022-08-05 ENCOUNTER — Inpatient Hospital Stay: Payer: Medicare Other | Attending: Hematology | Admitting: Hematology

## 2022-08-05 NOTE — Telephone Encounter (Signed)
Pt called in to r/s his appt for today. No showed his appt for today due to less than 24 hr notice of r/s request. R/s appt, pt is aware of new appt date/time.

## 2022-08-14 ENCOUNTER — Telehealth: Payer: Self-pay | Admitting: Hematology

## 2022-08-14 NOTE — Telephone Encounter (Signed)
Cancelled pt's new pt appt per 8/31 secure chat with Dr. Irene Limbo. Per MD pt needs to f/u with his pulmonologist to sch a biopsy before he sees medical oncology. I called pt, no answer. Left msg letting him know we have cancelled his appt per MD request and let him know he will need to f/u with his pulmonologist before we are able to see him.

## 2022-08-18 ENCOUNTER — Ambulatory Visit: Payer: Medicare Other | Admitting: Emergency Medicine

## 2022-08-28 ENCOUNTER — Ambulatory Visit: Payer: Medicare Other | Admitting: Hematology

## 2022-08-28 ENCOUNTER — Telehealth: Payer: Self-pay

## 2022-08-28 DIAGNOSIS — G40909 Epilepsy, unspecified, not intractable, without status epilepticus: Secondary | ICD-10-CM

## 2022-08-28 NOTE — Patient Outreach (Signed)
  Care Coordination   08/28/2022 Name: Richard [name] MRN: [id number] DOB: [birth year]   Care Coordination Outreach Attempts:  An unsuccessful telephone outreach was attempted today to offer the patient information about available care coordination services as a benefit of their health plan.   Follow Up Plan:  Additional outreach attempts will be made to offer the patient care coordination information and services.   Encounter Outcome:  No Answer  Care Coordination Interventions Activated:  No   Care Coordination Interventions:  No, not indicated    [facility] [phone]

## 2022-08-31 NOTE — Patient Outreach (Signed)
  Care Coordination   Initial Visit Note    Name: Richard [name] MRN: [id number] DOB: 1971-04-11  Richard [name] is a 51 y.o. year old male who sees Medicine, [facility] for primary care. I spoke with  Richard [name] by phone today.  What matters to the patients health and wellness today?  Receive Assistance with Nutrition and Transportation    Goals Addressed             This [name] with Nutrition and Transportation       Care Coordination Interventions: Assessed social determinant of health barriers Patient requested assistance with nutritional resources. Community Resource referral placed. Patient requested assistance with transportation. Richard [name] referral places. Discussed ED visit on 07/29/22 d/t loss of consciousness. Patient reports adhering to providers recommendations. Declined current need for assistance with disease management. Community Resources referral placed for nutrition and transportation.          SDOH assessments and interventions completed:  Yes  SDOH Interventions Today    Flowsheet Row Most Recent Value  SDOH Interventions   Food Insecurity Interventions Ambulatory [id number] Order  Transportation Interventions Ambulatory [id number] Order        Care Coordination Interventions Activated:  Yes  Care Coordination Interventions:  Yes, provided   Follow up plan: Referral made to [facility] team.    Encounter Outcome:  Pt. Visit Completed   Richard [name] Management [phone]

## 2022-08-31 NOTE — Patient Instructions (Signed)
Visit Information  Thank you for allowing the Care Management team to participate in your care.  Following are the goals we discussed today:   Goals Addressed             This Visit's Progress    Receive Assistance with Nutrition and Transportation       Care Coordination Interventions: Assessed social determinant of health barriers Patient requested assistance with nutritional resources. Community Resource referral placed. Patient requested assistance with transportation. [name] referral places. Discussed ED visit on 07/29/22 d/t loss of consciousness. Patient reports adhering to providers recommendations. Declined current need for assistance with disease management. Community Resources referral placed for nutrition and transportation.          [name] verbalized understanding of the information discussed during the telephonic outreach. Declined need for mailed instructions.   The [name] team will follow up to address concerns r/t nutrition and transportation.  [facility] [facility] [phone]

## 2022-10-13 ENCOUNTER — Encounter: Payer: Self-pay | Admitting: Gastroenterology

## 2022-10-13 NOTE — Progress Notes (Signed)
I have not seen this patient previously.

## 2022-10-15 ENCOUNTER — Ambulatory Visit: Payer: Medicare Other

## 2022-11-12 ENCOUNTER — Encounter: Payer: Self-pay | Admitting: Family Medicine

## 2022-11-23 ENCOUNTER — Ambulatory Visit (INDEPENDENT_AMBULATORY_CARE_PROVIDER_SITE_OTHER): Payer: 59 | Admitting: Emergency Medicine

## 2022-11-23 DIAGNOSIS — J439 Emphysema, unspecified: Secondary | ICD-10-CM

## 2022-11-23 LAB — PULMONARY FUNCTION TEST
DL/VA % pred: 39 %
DL/VA: 1.73 ml/min/mmHg/L
DLCO cor % pred: 27 %
DLCO cor: 8.3 ml/min/mmHg
DLCO unc % pred: 27 %
DLCO unc: 8.3 ml/min/mmHg
FEF 25-75 Post: 1.2 L/sec
FEF 25-75 Pre: 1.06 L/sec
FEF2575-%Change-Post: 13 %
FEF2575-%Pred-Post: 34 %
FEF2575-%Pred-Pre: 30 %
FEV1-%Change-Post: 3 %
FEV1-%Pred-Post: 46 %
FEV1-%Pred-Pre: 44 %
FEV1-Post: 1.86 L
FEV1-Pre: 1.79 L
FEV1FVC-%Change-Post: 2 %
FEV1FVC-%Pred-Pre: 83 %
FEV6-%Change-Post: 1 %
FEV6-%Pred-Post: 56 %
FEV6-%Pred-Pre: 55 %
FEV6-Post: 2.81 L
FEV6-Pre: 2.76 L
FEV6FVC-%Pred-Post: 103 %
FEV6FVC-%Pred-Pre: 103 %
FVC-%Change-Post: 1 %
FVC-%Pred-Post: 54 %
FVC-%Pred-Pre: 53 %
FVC-Post: 2.81 L
FVC-Pre: 2.76 L
Post FEV1/FVC ratio: 66 %
Post FEV6/FVC ratio: 100 %
Pre FEV1/FVC ratio: 65 %
Pre FEV6/FVC Ratio: 100 %
RV % pred: 88 %
RV: 1.9 L
TLC % pred: 66 %
TLC: 4.76 L

## 2022-11-23 NOTE — Progress Notes (Signed)
Full PFT performed today. °

## 2022-11-23 NOTE — Patient Instructions (Signed)
Full PFT performed today. °

## 2022-11-26 ENCOUNTER — Encounter: Payer: Self-pay | Admitting: *Deleted

## 2022-12-25 ENCOUNTER — Encounter (HOSPITAL_COMMUNITY): Payer: Self-pay | Admitting: Emergency Medicine

## 2022-12-25 ENCOUNTER — Observation Stay (HOSPITAL_COMMUNITY)
Admission: EM | Admit: 2022-12-25 | Discharge: 2022-12-28 | Disposition: A | Discharge status: Home / Self Care | Payer: 59 | Attending: Internal Medicine | Admitting: Internal Medicine

## 2022-12-25 ENCOUNTER — Other Ambulatory Visit: Payer: Self-pay

## 2022-12-25 ENCOUNTER — Emergency Department (HOSPITAL_COMMUNITY): Payer: 59

## 2022-12-25 DIAGNOSIS — Z8673 Personal history of transient ischemic attack (TIA), and cerebral infarction without residual deficits: Secondary | ICD-10-CM | POA: Insufficient documentation

## 2022-12-25 DIAGNOSIS — J9621 Acute and chronic respiratory failure with hypoxia: Principal | ICD-10-CM | POA: Diagnosis present

## 2022-12-25 DIAGNOSIS — J9601 Acute respiratory failure with hypoxia: Secondary | ICD-10-CM | POA: Diagnosis not present

## 2022-12-25 DIAGNOSIS — G40909 Epilepsy, unspecified, not intractable, without status epilepticus: Secondary | ICD-10-CM | POA: Insufficient documentation

## 2022-12-25 DIAGNOSIS — Z79899 Other long term (current) drug therapy: Secondary | ICD-10-CM | POA: Insufficient documentation

## 2022-12-25 DIAGNOSIS — Z86718 Personal history of other venous thrombosis and embolism: Secondary | ICD-10-CM | POA: Insufficient documentation

## 2022-12-25 DIAGNOSIS — C7A1 Malignant poorly differentiated neuroendocrine tumors: Secondary | ICD-10-CM | POA: Insufficient documentation

## 2022-12-25 DIAGNOSIS — R918 Other nonspecific abnormal finding of lung field: Secondary | ICD-10-CM

## 2022-12-25 DIAGNOSIS — I272 Pulmonary hypertension, unspecified: Secondary | ICD-10-CM | POA: Insufficient documentation

## 2022-12-25 DIAGNOSIS — F17210 Nicotine dependence, cigarettes, uncomplicated: Secondary | ICD-10-CM | POA: Diagnosis not present

## 2022-12-25 DIAGNOSIS — C3491 Malignant neoplasm of unspecified part of right bronchus or lung: Secondary | ICD-10-CM

## 2022-12-25 DIAGNOSIS — Z720 Tobacco use: Secondary | ICD-10-CM | POA: Diagnosis not present

## 2022-12-25 DIAGNOSIS — R0602 Shortness of breath: Secondary | ICD-10-CM | POA: Diagnosis present

## 2022-12-25 DIAGNOSIS — I110 Hypertensive heart disease with heart failure: Secondary | ICD-10-CM | POA: Insufficient documentation

## 2022-12-25 DIAGNOSIS — M7989 Other specified soft tissue disorders: Secondary | ICD-10-CM | POA: Diagnosis not present

## 2022-12-25 DIAGNOSIS — I10 Essential (primary) hypertension: Secondary | ICD-10-CM

## 2022-12-25 DIAGNOSIS — C3431 Malignant neoplasm of lower lobe, right bronchus or lung: Secondary | ICD-10-CM

## 2022-12-25 DIAGNOSIS — J449 Chronic obstructive pulmonary disease, unspecified: Secondary | ICD-10-CM | POA: Diagnosis not present

## 2022-12-25 DIAGNOSIS — Z87891 Personal history of nicotine dependence: Secondary | ICD-10-CM | POA: Insufficient documentation

## 2022-12-25 DIAGNOSIS — I5032 Chronic diastolic (congestive) heart failure: Secondary | ICD-10-CM | POA: Insufficient documentation

## 2022-12-25 DIAGNOSIS — Z1152 Encounter for screening for COVID-19: Secondary | ICD-10-CM | POA: Insufficient documentation

## 2022-12-25 DIAGNOSIS — Z9981 Dependence on supplemental oxygen: Secondary | ICD-10-CM | POA: Diagnosis not present

## 2022-12-25 LAB — CBC
HCT: 54.4 % — ABNORMAL HIGH (ref 39.0–52.0)
Hemoglobin: 17.8 g/dL — ABNORMAL HIGH (ref 13.0–17.0)
MCH: 32 pg (ref 26.0–34.0)
MCHC: 32.7 g/dL (ref 30.0–36.0)
MCV: 97.8 fL (ref 80.0–100.0)
Platelets: 250 10*3/uL (ref 150–400)
RBC: 5.56 MIL/uL (ref 4.22–5.81)
RDW: 15.4 % (ref 11.5–15.5)
WBC: 8.4 10*3/uL (ref 4.0–10.5)
nRBC: 0 % (ref 0.0–0.2)

## 2022-12-25 LAB — I-STAT VENOUS BLOOD GAS, ED
Acid-base deficit: 1 mmol/L (ref 0.0–2.0)
Bicarbonate: 23.3 mmol/L (ref 20.0–28.0)
Calcium, Ion: 1.05 mmol/L — ABNORMAL LOW (ref 1.15–1.40)
HCT: 54 % — ABNORMAL HIGH (ref 39.0–52.0)
Hemoglobin: 18.4 g/dL — ABNORMAL HIGH (ref 13.0–17.0)
O2 Saturation: 73 %
Potassium: 4.6 mmol/L (ref 3.5–5.1)
Sodium: 138 mmol/L (ref 135–145)
TCO2: 24 mmol/L (ref 22–32)
pCO2, Ven: 38.6 mmHg — ABNORMAL LOW (ref 44–60)
pH, Ven: 7.388 (ref 7.25–7.43)
pO2, Ven: 39 mmHg (ref 32–45)

## 2022-12-25 LAB — BASIC METABOLIC PANEL
Anion gap: 16 — ABNORMAL HIGH (ref 5–15)
BUN: 6 mg/dL (ref 6–20)
CO2: 19 mmol/L — ABNORMAL LOW (ref 22–32)
Calcium: 9.2 mg/dL (ref 8.9–10.3)
Chloride: 103 mmol/L (ref 98–111)
Creatinine, Ser: 0.89 mg/dL (ref 0.61–1.24)
GFR, Estimated: >60 mL/min (ref >60)
Glucose, Bld: 80 mg/dL (ref 70–99)
Potassium: 4.2 mmol/L (ref 3.5–5.1)
Sodium: 138 mmol/L (ref 135–145)

## 2022-12-25 LAB — RESP PANEL BY RT-PCR (RSV, FLU A&B, COVID)  RVPGX2
Influenza A by PCR: NEGATIVE
Influenza B by PCR: NEGATIVE
Resp Syncytial Virus by PCR: NEGATIVE
SARS Coronavirus 2 by RT PCR: NEGATIVE

## 2022-12-25 LAB — TROPONIN I (HIGH SENSITIVITY)
Troponin I (High Sensitivity): 22 ng/L — ABNORMAL HIGH (ref <18)
Troponin I (High Sensitivity): 18 ng/L — ABNORMAL HIGH (ref <18)

## 2022-12-25 LAB — BRAIN NATRIURETIC PEPTIDE: B Natriuretic Peptide: 151.6 pg/mL — ABNORMAL HIGH (ref 0.0–100.0)

## 2022-12-25 MED ORDER — PNEUMOCOCCAL 20-VAL CONJ VACC 0.5 ML IM SUSY: 0.5000 mL | PREFILLED_SYRINGE | INTRAMUSCULAR | Status: DC | PRN

## 2022-12-25 MED ORDER — CLONIDINE HCL 0.1 MG PO TABS
0.3000 mg | ORAL_TABLET | Freq: Two times a day (BID) | ORAL | Status: DC
  Administered 2022-12-25 – 2022-12-28 (×7): 0.3 mg via ORAL
  Filled 2022-12-25 (×7): qty 3

## 2022-12-25 MED ORDER — ACETAMINOPHEN 650 MG RE SUPP: 650.0000 mg | Freq: Four times a day (QID) | RECTAL | Status: DC | PRN

## 2022-12-25 MED ORDER — MOMETASONE FURO-FORMOTEROL FUM 200-5 MCG/ACT IN AERO
2.0000 | INHALATION_SPRAY | Freq: Two times a day (BID) | RESPIRATORY_TRACT | Status: DC
  Administered 2022-12-25 – 2022-12-27 (×4): 2 via RESPIRATORY_TRACT
  Filled 2022-12-25 (×2): qty 8.8

## 2022-12-25 MED ORDER — IPRATROPIUM-ALBUTEROL 0.5-2.5 (3) MG/3ML IN SOLN: 3.0000 mL | Freq: Four times a day (QID) | RESPIRATORY_TRACT | Status: DC | PRN

## 2022-12-25 MED ORDER — LOSARTAN POTASSIUM 50 MG PO TABS
25.0000 mg | ORAL_TABLET | Freq: Every day | ORAL | Status: DC
  Administered 2022-12-25 – 2022-12-28 (×4): 25 mg via ORAL
  Filled 2022-12-25 (×4): qty 1

## 2022-12-25 MED ORDER — ALBUTEROL SULFATE (2.5 MG/3ML) 0.083% IN NEBU: 3.0000 mL | INHALATION_SOLUTION | RESPIRATORY_TRACT | Status: DC | PRN

## 2022-12-25 MED ORDER — ACETAMINOPHEN 325 MG PO TABS: 650.0000 mg | ORAL_TABLET | Freq: Four times a day (QID) | ORAL | Status: DC | PRN

## 2022-12-25 MED ORDER — IOHEXOL 350 MG/ML SOLN
45.0000 mL | Freq: Once | INTRAVENOUS | Status: AC | PRN
  Administered 2022-12-25: 45 mL via INTRAVENOUS

## 2022-12-25 MED ORDER — LEVETIRACETAM 500 MG PO TABS
500.0000 mg | ORAL_TABLET | Freq: Two times a day (BID) | ORAL | Status: DC
  Administered 2022-12-25 – 2022-12-27 (×6): 500 mg via ORAL
  Filled 2022-12-25 (×7): qty 1

## 2022-12-25 MED ORDER — ONDANSETRON HCL 4 MG PO TABS: 4.0000 mg | ORAL_TABLET | Freq: Four times a day (QID) | ORAL | Status: DC | PRN

## 2022-12-25 MED ORDER — SENNOSIDES-DOCUSATE SODIUM 8.6-50 MG PO TABS: 1.0000 | ORAL_TABLET | Freq: Every evening | ORAL | Status: DC | PRN

## 2022-12-25 MED ORDER — ENOXAPARIN SODIUM 60 MG/0.6ML IJ SOSY
0.5000 mg/kg | PREFILLED_SYRINGE | INTRAMUSCULAR | Status: DC
  Administered 2022-12-25 – 2022-12-26 (×2): 50 mg via SUBCUTANEOUS
  Filled 2022-12-25 (×2): qty 0.6

## 2022-12-25 MED ORDER — FUROSEMIDE 10 MG/ML IJ SOLN
20.0000 mg | Freq: Once | INTRAMUSCULAR | Status: AC
  Administered 2022-12-25: 20 mg via INTRAVENOUS
  Filled 2022-12-25: qty 2

## 2022-12-25 MED ORDER — ONDANSETRON HCL 4 MG/2ML IJ SOLN: 4.0000 mg | Freq: Four times a day (QID) | INTRAMUSCULAR | Status: DC | PRN

## 2022-12-25 NOTE — ED Triage Notes (Signed)
Pt reports "my chest aches, my head hurts and my legs are swollen."  Pt was initially 77% on RA, however he usually is on 3 L [place] at home he came to the ED w/ none.  He was placed on 3L [place] and his O2 gradually increased to 100%.  Pt does have bilateral leg swelling.

## 2022-12-25 NOTE — Consult Note (Signed)
Mr. Ponciano is a nice 52 year old African-American male.  He does have a complicated history.  He apparently presented with a right lower lobe mass a year ago.  Apparently, he was found to have a nodule several years before that.  He never did follow-up.  Subsequently had growth of the mass.  He subsequently was seen by Pulmonary Medicine.  This was on 11/27/2022.  He was admitted to the hospital I think on 02/02/2022 with some hypoxia.  Has a history of seizure disorder.  He has been smoking.  He said he stopped.  He said he smoked a pack and a half a day for many years.  He had a CT angiogram that was done a year ago.  There is no pulmonary embolism.  However, he had a large mass in the right lower lung.  He did undergo bronchoscopy.  I think this was done on 02/10/2022.  He had right mainstem bronchus was somewhat compressed.  He had right upper lobe bronchus appeared to be narrowed.  Endobronchial ultrasound showed some enlarged mediastinal and hilar lymph nodes.  Multiple biopsies did not prove malignancy although there was "suspicious for malignancy."  He apparently underwent radiation therapy.  Extremity treatment see really received.  He then never followed up with medical oncology.  Seems like his initial stage was IIIb.  Again, he show back up to the emergency room today.  He had hypoxia.  He had some chest pain.  He had a another CT angiogram.  There is no pulmonary embolism.  However, the posterior right upper lobe mass was abutting the posterior hilum and posterior mediastinum.  Now measured 7.9 x 4.1 x 4.7 cm.  There is a solid nodule in the right upper lobe.  He had enlargement of mediastinal lymph nodes.  There is no obvious metastatic disease to the abdomen.  He was admitted.  His labs show sodium 138.  Potassium 4.2.  BUN 6 creatinine 0.9.  Calcium 9.2.  His white cell count is 8.4.  Hemoglobin 17.8.  Platelet count 250,000.  He says a he wants to be treated.  I explained to him  what needs to be done.  Again he really needs to have another bronchoscopy.  We have not establish a firm tissue diagnosis of bronchogenic carcinoma.  We really need tissue to send off for molecular markers.  I would think that given his tobacco history, the molecular markers will be negative.  Again, he indicates that he would be treated.  I explained to him that he needs chemotherapy.  I explained to him what chemotherapy was.  At the present time, he is more aggravated with the fact that he cannot get food that he wants.  I do not see a problem with changing his diet to a regular diet.  He will need to have a Port-A-Cath in.  I explained to him what a Port-A-Cath was.  I explained to him why Port-A-Cath would be helpful.  He agrees.  I suspect he probably will also need to have an MRI of the brain.  A bone scan probably also would be helpful.  I am unsure how long he would be willing to stay in the hospital.  As such, he really needs to have procedures done quickly.  I would like to think that Pulmonary Medicine can do a bronchoscopy on Monday.  I would like to hope that Interventional Radiology can do a Port-A-Cath on Monday.  He does drink quite a bit.  He says  occasionally he will have 1/5 of gin.  He still smokes.  I suspect this is can be quite challenging to try to treat him.  I hope that he understands the severity of his disease.  I really did not talk about prognosis as of yet.   On his physical exam, his temperature is 98.4.  Pulse 90.  Blood pressure 155/111.  Oxygen saturation on I think 3 L is 94%.  His head and neck exam shows no adenopathy in the neck.  He has no oral lesions.  Extraocular muscles are intact.  Lungs are with some wheezing bilaterally.  He has decent air movement bilaterally.  Cardiac exam regular rate and rhythm.  Abdomen is soft.  Bowel sounds are present.  Extremities shows some clubbing in his nailbeds.  He has a little bit of swelling of the legs.   Neurological exam is nonfocal.   Mr. Hansell is a 52 year old African-American male.  He clearly has a locally advanced-at least stage IIIc -of the right lung.  I had to believe that this is a bronchogenic carcinoma.  I suspect it might be squamous cell carcinoma.  Again, chemoimmunotherapy will be the treatment of choice.  However, we MUST MUST MUST get tissue.  Please get a Pulmonary Medicine to see him to see about a bronchoscopy.  I will also do some additional staging studies on him.  I just hope that he will do treatments.  We will follow along.  I know he will get wonderful care from everybody on [address].   Christin Bach, MD  Duwayne Heck 41:10

## 2022-12-25 NOTE — ED Notes (Signed)
ED TO INPATIENT HANDOFF REPORT  ED Nurse Name and Phone #: Joice Lofts [phone]  S Name/Age/Gender Richard [name] 52 y.o. male Room/Bed: 030C/030C  Code Status   Code Status: Full Code  Home/SNF/Other Home Patient oriented to: self, place, time, and situation Is this baseline? Yes   Triage Complete: Triage complete  Chief Complaint Acute respiratory failure with hypoxia (HCC) [J96.01] Acute on chronic hypoxic respiratory failure (HCC) [J96.21]  Triage Note Pt reports "my chest aches, my head hurts and my legs are swollen."  Pt was initially 77% on RA, however he usually is on 3 L [place] at home he came to the ED w/ none.  He was placed on 3L [place] and his O2 gradually increased to 100%.  Pt does have bilateral leg swelling.      Allergies Allergies  Allergen Reactions   Dilaudid [Hydromorphone Hcl] Nausea Only and Other (See Comments)    Bradycardia and hyperthermia, too   Tape Itching and Dermatitis    Level of Care/Admitting Diagnosis ED Disposition     ED Disposition  Admit   Condition  --   Comment  Hospital Area: [facility] [[id number]]  Level of Care: Med-Surg [16]  May place patient in observation at [facility] or [facility] if equivalent level of care is available:: No  Covid Evaluation: Confirmed COVID Negative  Diagnosis: Acute on chronic hypoxic respiratory failure [facility] [[id number]]  Admitting Physician: Mercie Eon [[id number]]  Attending Physician: Mercie Eon [[id number]]          B Medical/Surgery History Past Medical History:  Diagnosis Date   Asthma    Brain bleed (HCC)    DVT (deep venous thrombosis) (HCC) 02/19/2015   RLE   GERD (gastroesophageal reflux disease)    History of home oxygen therapy    Hypertension    Seizures (HCC)    last episode 03/2013   Stroke [facility])    Past Surgical History:  Procedure Laterality Date   BRONCHIAL BIOPSY  02/10/2022   Procedure: BRONCHIAL BIOPSIES;  Surgeon: Oretha Milch, MD;  Location: [facility]  ENDOSCOPY;  Service: Cardiopulmonary;;   BRONCHIAL BRUSHINGS  02/10/2022   Procedure: BRONCHIAL BRUSHINGS;  Surgeon: Oretha Milch, MD;  Location: [facility] ENDOSCOPY;  Service: Cardiopulmonary;;   BRONCHIAL NEEDLE ASPIRATION BIOPSY  02/10/2022   Procedure: BRONCHIAL NEEDLE ASPIRATION BIOPSIES;  Surgeon: Oretha Milch, MD;  Location: [facility] ENDOSCOPY;  Service: Cardiopulmonary;;   BRONCHIAL WASHINGS  02/10/2022   Procedure: BRONCHIAL WASHINGS;  Surgeon: Oretha Milch, MD;  Location: [facility] ENDOSCOPY;  Service: Cardiopulmonary;;   ENDOBRONCHIAL ULTRASOUND Bilateral 02/10/2022   Procedure: ENDOBRONCHIAL ULTRASOUND;  Surgeon: Oretha Milch, MD;  Location: [facility] ENDOSCOPY;  Service: Cardiopulmonary;  Laterality: Bilateral;   LEG SURGERY     VIDEO BRONCHOSCOPY  02/10/2022   Procedure: VIDEO BRONCHOSCOPY WITHOUT FLUORO;  Surgeon: Oretha Milch, MD;  Location: [facility] ENDOSCOPY;  Service: Cardiopulmonary;;     A IV Location/Drains/Wounds Patient Lines/Drains/Airways Status     Active Line/Drains/Airways     Name Placement date Placement time Site Days   Peripheral IV 12/25/22 20 G Left Antecubital 12/25/22  0700  Antecubital  less than 1            Intake/Output Last 24 hours  Intake/Output Summary (Last 24 hours) at 12/25/2022 1312 Last data filed at 12/25/2022 1253 Gross per 24 hour  Intake --  Output 400 ml  Net -400 ml    Labs/Imaging Results for orders placed or performed during the hospital encounter  of 12/25/22 (from the past 48 hour(s))  Basic metabolic panel     Status: Abnormal   Collection Time: 12/25/22  5:25 AM  Result Value Ref Range   Sodium 138 135 - 145 mmol/L   Potassium 4.2 3.5 - 5.1 mmol/L   Chloride 103 98 - 111 mmol/L   CO2 19 (L) 22 - 32 mmol/L   Glucose, Bld 80 70 - 99 mg/dL    Comment: Glucose reference range applies only to samples taken after fasting for at least 8 hours.   BUN 6 6 - 20 mg/dL   Creatinine, Ser 2.39 0.61 - 1.24 mg/dL   Calcium 9.2 8.9 - 53.2 mg/dL    GFR, Estimated >02 >33 mL/min    Comment: (NOTE) Calculated using the CKD-EPI Creatinine Equation (2021)    Anion gap 16 (H) 5 - 15    Comment: Performed at [facility], [address] [place], [place] [address]  CBC     Status: Abnormal   Collection Time: 12/25/22  5:25 AM  Result Value Ref Range   WBC 8.4 4.0 - 10.5 K/uL   RBC 5.56 4.22 - 5.81 MIL/uL   Hemoglobin 17.8 (H) 13.0 - 17.0 g/dL   HCT 61.6 (H) 83.7 - 29.0 %   MCV 97.8 80.0 - 100.0 fL   MCH 32.0 26.0 - 34.0 pg   MCHC 32.7 30.0 - 36.0 g/dL   RDW 21.1 15.5 - 20.8 %   Platelets 250 150 - 400 K/uL   nRBC 0.0 0.0 - 0.2 %    Comment: Performed at [facility], [address], [place], [place] [address]  Troponin I (High Sensitivity)     Status: Abnormal   Collection Time: 12/25/22  5:25 AM  Result Value Ref Range   Troponin I (High Sensitivity) 22 (H) <18 ng/L    Comment: (NOTE) Elevated high sensitivity troponin I (hsTnI) values and significant  changes across serial measurements may suggest ACS but many other  chronic and acute conditions are known to elevate hsTnI results.  Refer to the "Links" section for chest pain algorithms and additional  guidance. Performed at [facility], [address] [place], [place] [address]   Brain natriuretic peptide     Status: Abnormal   Collection Time: 12/25/22  5:25 AM  Result Value Ref Range   B Natriuretic Peptide 151.6 (H) 0.0 - 100.0 pg/mL    Comment: Performed at [facility], [address] [place], [place] [address]  Resp panel by RT-PCR (RSV, Flu A&B, Covid) Anterior Nasal Swab     Status: None   Collection Time: 12/25/22  6:39 AM   Specimen: Anterior Nasal Swab  Result Value Ref Range   SARS Coronavirus 2 by RT PCR NEGATIVE NEGATIVE    Comment: (NOTE) SARS-CoV-2 target nucleic acids are NOT DETECTED.  The SARS-CoV-2 RNA is generally detectable in upper respiratory specimens during the acute phase of infection. The lowest concentration of  SARS-CoV-2 viral copies this assay can detect is 138 copies/mL. A negative result does not preclude SARS-Cov-2 infection and should not be used as the sole basis for treatment or other patient management decisions. A negative result may occur with  improper specimen collection/handling, submission of specimen other than nasopharyngeal swab, presence of viral mutation(s) within the areas targeted by this assay, and inadequate number of viral copies(<138 copies/mL). A negative result must be combined with clinical observations, patient history, and epidemiological information. The expected result is  Negative.  Fact Sheet for Patients:  BloggerCourse.com  Fact Sheet for Healthcare Providers:  SeriousBroker.it  This test is no t yet approved or cleared by the [place] FDA and  has been authorized for detection and/or diagnosis of SARS-CoV-2 by FDA under an Emergency Use Authorization (EUA). This EUA will remain  in effect (meaning this test can be used) for the duration of the COVID-19 declaration under Section 564(b)(1) of the Act, 21 U.S.C.section 360bbb-3(b)(1), unless the authorization is terminated  or revoked sooner.       Influenza A by PCR NEGATIVE NEGATIVE   Influenza B by PCR NEGATIVE NEGATIVE    Comment: (NOTE) The Xpert Xpress SARS-CoV-2/FLU/RSV plus assay is intended as an aid in the diagnosis of influenza from Nasopharyngeal swab specimens and should not be used as a sole basis for treatment. Nasal washings and aspirates are unacceptable for Xpert Xpress SARS-CoV-2/FLU/RSV testing.  Fact Sheet for Patients: BloggerCourse.com  Fact Sheet for Healthcare Providers: SeriousBroker.it  This test is not yet approved or cleared by the [place] FDA and has been authorized for detection and/or diagnosis of SARS-CoV-2 by FDA under an Emergency Use Authorization (EUA).  This EUA will remain in effect (meaning this test can be used) for the duration of the COVID-19 declaration under Section 564(b)(1) of the Act, 21 U.S.C. section 360bbb-3(b)(1), unless the authorization is terminated or revoked.     Resp Syncytial Virus by PCR NEGATIVE NEGATIVE    Comment: (NOTE) Fact Sheet for Patients: BloggerCourse.com  Fact Sheet for Healthcare Providers: SeriousBroker.it  This test is not yet approved or cleared by the [place] FDA and has been authorized for detection and/or diagnosis of SARS-CoV-2 by FDA under an Emergency Use Authorization (EUA). This EUA will remain in effect (meaning this test can be used) for the duration of the COVID-19 declaration under Section 564(b)(1) of the Act, 21 U.S.C. section 360bbb-3(b)(1), unless the authorization is terminated or revoked.  Performed at [facility], [address] [place], [place] [address]   Troponin I (High Sensitivity)     Status: Abnormal   Collection Time: 12/25/22  6:58 AM  Result Value Ref Range   Troponin I (High Sensitivity) 18 (H) <18 ng/L    Comment: (NOTE) Elevated high sensitivity troponin I (hsTnI) values and significant  changes across serial measurements may suggest ACS but many other  chronic and acute conditions are known to elevate hsTnI results.  Refer to the "Links" section for chest pain algorithms and additional  guidance. Performed at [facility], [address], [place], [place] [address]   I-Stat venous blood gas, [facility], [facility], [facility])     Status: Abnormal   Collection Time: 12/25/22  7:09 AM  Result Value Ref Range   pH, Ven 7.388 7.25 - 7.43   pCO2, Ven 38.6 (L) 44 - 60 mmHg   pO2, Ven 39 32 - 45 mmHg   Bicarbonate 23.3 20.0 - 28.0 mmol/L   TCO2 24 22 - 32 mmol/L   O2 Saturation 73 %   Acid-base deficit 1.0 0.0 - 2.0 mmol/L   Sodium 138 135 - 145 mmol/L   Potassium 4.6 3.5 - 5.1 mmol/L   Calcium, Ion  1.05 (L) 1.15 - 1.40 mmol/L   HCT 54.0 (H) 39.0 - 52.0 %   Hemoglobin 18.4 (H) 13.0 - 17.0 g/dL   Sample type VENOUS    Comment NOTIFIED PHYSICIAN    CT Angio Chest PE W and/or Wo Contrast  Result Date: 12/25/2022 CLINICAL DATA:  Shortness of breath and hypoxia. History of lung carcinoma. EXAM: CT ANGIOGRAPHY CHEST WITH CONTRAST TECHNIQUE: Multidetector CT imaging of the chest was performed using the standard protocol during bolus administration of intravenous contrast. Multiplanar CT image reconstructions and MIPs were obtained to evaluate the vascular anatomy. RADIATION DOSE REDUCTION: This exam was performed according to the departmental dose-optimization program which includes automated exposure control, adjustment of the mA and/or kV according to patient size and/or use of iterative reconstruction technique. CONTRAST:  76mL OMNIPAQUE IOHEXOL 350 MG/ML SOLN COMPARISON:  CT of the chest on 06/10/2022 and prior CTA on 02/02/2022 FINDINGS: Cardiovascular: Pulmonary arteries are adequately opacified. There is no evidence of pulmonary embolism. Stable main pulmonary artery dilatation measuring up to 3.6 cm. Stable dilated ascending thoracic aorta measuring up to 4.3 cm. No evidence of aortic dissection. Normal heart size. No visualized calcified coronary artery plaque. No pericardial fluid identified. Mediastinum/Nodes: AP window node on image 49 shows enlargement measuring 11 mm in short axis compared to 8 mm previously. Right lower paratracheal lymph node demonstrates interval enlargement measuring approximately 12 mm in short axis compared to 10 mm previously. Subcarinal lymph node difficult to measure as it abuts tumor but likely stable in size measuring roughly 15 mm. There are multiple other scattered smaller mediastinal nodes. Lungs/Pleura: Posterior right upper lobe mass abutting the posterior hilum and posterior mediastinum may be slightly increased in size since the prior study with current measured  dimensions of approximately 7.9 x 4.1 x 4.9 cm (previously 7.2 x 4.1 x 4.8 cm). The satellite nodule more superiorly in the right upper lobe appears stable in size measuring approximately 1 cm. No further definite visualized smaller nodules in the right upper lobe. Lungs demonstrate generalized septal thickening since the prior study with scattered areas of ground-glass opacity and associated pulmonary venous distension. Findings are suggestive of mild pulmonary interstitial edema. No associated pleural fluid, focal airspace consolidation or pneumothorax. Upper Abdomen: Stable low-density left adrenal adenoma measuring up to roughly 2.7 cm. This has previously been demonstrated to have long-term stability and does not require follow-up. Musculoskeletal: No bony lesions or fractures identified. Review of the MIP images confirms the above findings. IMPRESSION: 1. No evidence of pulmonary embolism. 2. Stable main pulmonary artery dilatation consistent with pulmonary hypertension. 3. Stable dilated ascending thoracic aorta measuring 4.3 cm. Recommend annual imaging followup by CTA or MRA. This recommendation follows 2010 ACCF/AHA/AATS/ACR/ASA/SCA/SCAI/SIR/STS/SVM Guidelines for the Diagnosis and Management of Patients with Thoracic Aortic Disease. Circulation. 2010; 121: J191-Y782. Aortic aneurysm NOS (ICD10-I71.9) 4. Mild pulmonary interstitial edema without pleural effusions. 5. Slight enlargement of the posterior right upper lobe mass abutting the posterior hilum and posterior mediastinum. The satellite nodule more superiorly in the right upper lobe appears stable in size measuring approximately 1 cm. No further definite visualized smaller nodules in the right upper lobe. 6. Interval enlargement of AP window and right lower paratracheal lymph nodes since the prior study. This finding as well as slight enlargement of the right perihilar upper lobe mass is suggestive of progression of malignancy. Recommend follow-up  with Oncology and consideration of additional follow-up PET imaging. 7. Stable low-density left adrenal adenoma. This has previously been demonstrated to have long-term stability and does not require follow-up. Electronically Signed   By: Aletta Edouard M.D.   On: 12/25/2022 08:29   DG Chest 2 View  Result Date: 12/25/2022 CLINICAL DATA:  52 year old male with history of chest pain and shortness of breath. EXAM: CHEST -  2 VIEW COMPARISON:  Chest x-ray 02/02/2022.  Chest CT 06/10/2022. FINDINGS: When compared to the prior chest radiograph the right upper lobe pulmonary mass appears slightly regressed, but there is persistent soft tissue fullness in the medial right upper lobe as well as the right suprahilar region, with surrounding interstitial prominence and architectural distortion, likely to reflect a combination of evolving postradiation changes, and potentially residual tumor (difficult to assess on today's plain film examination). Left lung is clear. No pleural effusions. No pneumothorax. No evidence of pulmonary edema. Heart size is mildly enlarged. IMPRESSION: 1. No definite radiographic evidence of acute cardiopulmonary disease. 2. Evidence of treated right upper lobe neoplasm. 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 Residual neoplasm is not excluded. Follow-up contrast-enhanced chest CT should be considered in the near future when clinically appropriate to re-evaluate these findings. Electronically Signed   By: Trudie Reed M.D.   On: 12/25/2022 05:46    Pending Labs Unresulted Labs (From admission, onward)    None       Vitals/Pain Today's Vitals   12/25/22 1037 12/25/22 1100 12/25/22 1145 12/25/22 1200  BP:  (!) 144/117 (!) 152/99 (!) 126/114  Pulse: (!) 103 (!) 103  (!) 102  Resp: 15 (!) 26 (!) 22 15  Temp:      TempSrc:      SpO2: 93% (!) 86%  92%  Weight:      Height:      PainSc:        Isolation Precautions No active  isolations  Medications Medications  enoxaparin (LOVENOX) injection 40 mg (has no administration in time range)  acetaminophen (TYLENOL) tablet 650 mg (has no administration in time range)    Or  acetaminophen (TYLENOL) suppository 650 mg (has no administration in time range)  senna-docusate (Senokot-S) tablet 1 tablet (has no administration in time range)  ondansetron (ZOFRAN) tablet 4 mg (has no administration in time range)    Or  ondansetron (ZOFRAN) injection 4 mg (has no administration in time range)  cloNIDine (CATAPRES) tablet 0.3 mg (has no administration in time range)  albuterol (VENTOLIN HFA) 108 (90 Base) MCG/ACT inhaler 2 puff (has no administration in time range)  mometasone-formoterol (DULERA) 200-5 MCG/ACT inhaler 2 puff (has no administration in time range)  levETIRAcetam (KEPPRA) tablet 500 mg (has no administration in time range)  losartan (COZAAR) tablet 25 mg (has no administration in time range)  ipratropium-albuterol (DUONEB) 0.5-2.5 (3) MG/3ML nebulizer solution 3 mL (has no administration in time range)  iohexol (OMNIPAQUE) 350 MG/ML injection 45 mL (45 mLs Intravenous Contrast Given 12/25/22 0753)  furosemide (LASIX) injection 20 mg (20 mg Intravenous Given 12/25/22 0958)    Mobility walks Low fall risk   Focused Assessments Cardiac Assessment Handoff:  Cardiac Rhythm: Normal sinus rhythm Lab Results  Component Value Date   CKTOTAL 134 05/09/2020   CKMB 2.0 05/10/2011   TROPONINI <0.03 01/02/2017   Lab Results  Component Value Date   DDIMER 0.32 07/31/2014   Does the Patient currently have chest pain? No    R Recommendations: See Admitting Provider Note  Report given to:   Additional Notes:

## 2022-12-25 NOTE — ED Provider Notes (Signed)
[facility] EMERGENCY DEPARTMENT Provider Note   CSN: [id number] Arrival date & time: 12/25/22  0457     History  Chief Complaint  Patient presents with   Chest Pain    Demere Gopaul is a 52 y.o. male.  Patient with h/o hypertension, seizure disorder in Keppra, COPD on chronic O2 3L, TIA -- history of lung cancer diagnosed in 2021, subsequent follow-ups with pulmonology however patient has been lost to follow-up several times and has not received any definitive care for his lung cancer or definitive tissue biopsy from what I can tell --presents to the emergency department today for evaluation of several symptoms ongoing for about a month.  Patient reports high blood pressure, difficulty controlling this, increasing lower extremity swelling, increasing shortness of breath.  He is on 3 L of oxygen at home.  He denies fevers, nausea, vomiting, diarrhea.  He has had some intermittent sharp pains, around my heart".       Home Medications Prior to Admission medications   Medication Sig Start Date End Date Taking? Authorizing Provider  albuterol (VENTOLIN HFA) 108 (90 Base) MCG/ACT inhaler Inhale 1-2 puffs into the lungs every 6 (six) hours as needed for wheezing or shortness of breath. 06/03/22   Parrett, Virgel Bouquet, NP  Aromatic Inhalants (INHALER DECONGESTANT IN) Inhale 1 spray into the lungs daily as needed (sob/cough). Patient not taking: Reported on 06/03/2022    [provider]  cloNIDine (CATAPRES) 0.3 MG tablet Take 1 tablet (0.3 mg total) by mouth 2 (two) times daily. Patient not taking: Reported on 06/03/2022 02/11/22   Albertine Grates, MD  folic acid (FOLVITE) 1 MG tablet Take 1 tablet (1 mg total) by mouth daily. Patient not taking: Reported on 02/02/2022 10/15/21   Joycelyn Das, MD  levETIRAcetam (KEPPRA) 500 MG tablet Take 500 mg by mouth 2 (two) times daily. Patient not taking: Reported on 06/03/2022    [provider]  lidocaine (LIDODERM) 5 % Place 1  patch onto the skin daily. Remove & Discard patch within 12 hours or as directed by MD Patient not taking: Reported on 02/02/2022 07/11/20   Petrucelli, Lelon Mast R, PA-C  losartan (COZAAR) 25 MG tablet Take 1 tablet (25 mg total) by mouth daily. Patient not taking: Reported on 06/03/2022 02/11/22   Albertine Grates, MD  methocarbamol (ROBAXIN) 500 MG tablet Take 1 tablet (500 mg total) by mouth every 8 (eight) hours as needed for muscle spasms. Patient not taking: Reported on 02/02/2022 07/11/20   Petrucelli, Lelon Mast R, PA-C  Multiple Vitamin (MULTIVITAMIN WITH MINERALS) TABS tablet Take 1 tablet by mouth daily. Patient not taking: Reported on 02/02/2022 10/15/21   Joycelyn Das, MD  OXYGEN Inhale 2 L/min into the lungs as needed (for shortness of breath).    [provider]  Phenytoin (DILANTIN PO) Take 1 capsule by mouth in the morning and at bedtime. Patient not taking: Reported on 06/03/2022    [provider]  thiamine 100 MG tablet Take 1 tablet (100 mg total) by mouth daily. Patient not taking: Reported on 02/02/2022 10/15/21   Joycelyn Das, MD  Tiotropium Bromide Monohydrate (SPIRIVA RESPIMAT) 2.5 MCG/ACT AERS Inhale 2 puffs into the lungs daily. Patient not taking: Reported on 01/28/2022 04/09/20   Luciano Cutter, MD  Tiotropium Bromide Monohydrate (SPIRIVA RESPIMAT) 2.5 MCG/ACT AERS Inhale 2 puffs into the lungs daily. Patient not taking: Reported on 01/28/2022 04/09/20   Luciano Cutter, MD  Tiotropium Bromide-Olodaterol (STIOLTO RESPIMAT) 2.5-2.5 MCG/ACT AERS Inhale 2  puffs into the lungs daily. 06/03/22   Parrett, Virgel Bouquet, NP  vitamin B-12 (CYANOCOBALAMIN) 1000 MCG tablet Take 1 tablet (1,000 mcg total) by mouth daily. Patient not taking: Reported on 06/03/2022 03/04/20   Tyrone Nine, MD      Allergies    Dilaudid [hydromorphone hcl] and Tape    Review of Systems   Review of Systems  Physical Exam Updated Vital Signs BP (!) 180/120   Pulse 87   Temp 97.8 F (36.6 C)    Resp (!) 23   Ht 5\' 9"  (1.753 m)   Wt 102.1 kg   SpO2 (!) 87%   BMI 33.23 kg/m   Physical Exam Vitals and nursing note reviewed.  Constitutional:      General: He is not in acute distress.    Appearance: He is well-developed.  HENT:     Head: Normocephalic and atraumatic.  Eyes:     General:        Right eye: No discharge.        Left eye: No discharge.     Conjunctiva/sclera: Conjunctivae normal.  Cardiovascular:     Rate and Rhythm: Normal rate and regular rhythm.     Heart sounds: Normal heart sounds.  Pulmonary:     Effort: Pulmonary effort is normal.     Breath sounds: Normal breath sounds. No decreased breath sounds, wheezing or rhonchi.  Abdominal:     Palpations: Abdomen is soft.     Tenderness: There is no abdominal tenderness.  Musculoskeletal:     Cervical back: Normal range of motion and neck supple.     Right lower leg: No tenderness. Edema present.     Left lower leg: No tenderness. Edema present.  Skin:    General: Skin is warm and dry.  Neurological:     Mental Status: He is alert.     ED Results / Procedures / Treatments   Labs (all labs ordered are listed, but only abnormal results are displayed) Labs Reviewed  BASIC METABOLIC PANEL - Abnormal; Notable for the following components:      Result Value   CO2 19 (*)    Anion gap 16 (*)    All other components within normal limits  CBC - Abnormal; Notable for the following components:   Hemoglobin 17.8 (*)    HCT 54.4 (*)    All other components within normal limits  BRAIN NATRIURETIC PEPTIDE - Abnormal; Notable for the following components:   B Natriuretic Peptide 151.6 (*)    All other components within normal limits  I-STAT VENOUS BLOOD GAS, ED - Abnormal; Notable for the following components:   pCO2, Ven 38.6 (*)    Calcium, Ion 1.05 (*)    HCT 54.0 (*)    Hemoglobin 18.4 (*)    All other components within normal limits  TROPONIN I (HIGH SENSITIVITY) - Abnormal; Notable for the following  components:   Troponin I (High Sensitivity) 22 (*)    All other components within normal limits  TROPONIN I (HIGH SENSITIVITY) - Abnormal; Notable for the following components:   Troponin I (High Sensitivity) 18 (*)    All other components within normal limits  RESP PANEL BY RT-PCR (RSV, FLU A&B, COVID)  [id number]    ED ECG REPORT   Date: 12/25/2022  Rate: 100  Rhythm: normal sinus rhythm and premature atrial contractions (PAC)  QRS Axis: normal  Intervals: normal  ST/T Wave abnormalities: nonspecific ST/T changes  Conduction Disutrbances:none  Narrative Interpretation:  Old EKG Reviewed: changes noted, faster today 07/2022  I have personally reviewed the EKG tracing and agree with the computerized printout as noted.   Radiology DG Chest 2 View  Result Date: 12/25/2022 CLINICAL DATA:  52 year old male with history of chest pain and shortness of breath. EXAM: CHEST - 2 VIEW COMPARISON:  Chest x-ray 02/02/2022.  Chest CT 06/10/2022. FINDINGS: When compared to the prior chest radiograph the right upper lobe pulmonary mass appears slightly regressed, but there is persistent soft tissue fullness in the medial right upper lobe as well as the right suprahilar region, with surrounding interstitial prominence and architectural distortion, likely to reflect a combination of evolving postradiation changes, and potentially residual tumor (difficult to assess on today's plain film examination). Left lung is clear. No pleural effusions. No pneumothorax. No evidence of pulmonary edema. Heart size is mildly enlarged. IMPRESSION: 1. No definite radiographic evidence of acute cardiopulmonary disease. 2. Evidence of treated right upper lobe neoplasm. 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 Residual neoplasm is not excluded. Follow-up contrast-enhanced chest CT should be considered in the near future when clinically appropriate to re-evaluate these findings.  Electronically Signed   By: Vinnie Langton M.D.   On: 12/25/2022 05:46    Procedures Procedures    Medications Ordered in ED Medications  furosemide (LASIX) injection 20 mg (has no administration in time range)  iohexol (OMNIPAQUE) 350 MG/ML injection 45 mL (45 mLs Intravenous Contrast Given 12/25/22 0753)    ED Course/ Medical Decision Making/ A&P    Patient seen and examined. History obtained directly from patient.  Reviewed previous pulmonology and ED notes.  Work-up including labs, imaging, EKG ordered in triage, if performed, were reviewed.    Labs/EKG: Independently reviewed and interpreted.  This included: CBC with elevated hemoglobin at 17.8, normal white blood cell count; BMP bicarb 19 otherwise unremarkable; troponin 22; BNP slightly elevated from baseline at 151.   Imaging: Independently visualized and interpreted.  This included: Chest x-ray, shows changes consistent with right upper lobe neoplasm.  Medications/Fluids: Ordered: None ordered.  Most recent vital signs reviewed and are as follows: BP (!) 180/120   Pulse 87   Temp 97.8 F (36.6 C)   Resp (!) 23   Ht 5\' 9"  (1.753 m)   Wt 102.1 kg   SpO2 (!) 87%   BMI 33.23 kg/m   Initial impression: Patient with multiple symptoms, he is requiring more oxygen than normal today.  Upon entering the room on 3 L patient at about 85%.  This increased to 87-88% on 4 L, finally increased to 90% on 5 L.  Added respiratory viral panel, VBG.  He will need evaluation for PE and CT scan to evaluate current appearance of lung CA.  9:14 AM Reassessment performed. Patient appears stable.  Coughing during exam.  Currently 92% on 5 L.  Labs personally reviewed and interpreted including: ABG with pH 7.388 and normal bicarb; troponin 22 >> 18.   Imaging personally visualized and interpreted including: CT angio of the chest with pulmonary mass, enlarged lymph nodes, pulmonary hypertension/mild edema.  Reviewed pertinent lab work and  imaging with patient at bedside. Questions answered.  I spoke with the patient about staying in the hospital.  He seems willing to do this.  States that he is taking his blood pressure medicines.  Most current vital signs reviewed and are as follows: BP (!) 162/119   Pulse 95   Temp 97.8 F (36.6 C)   Resp (!)  24   Ht 5\' 9"  (1.753 m)   Wt 102.1 kg   SpO2 91%   BMI 33.23 kg/m   Plan: Admit to the hospital for hypoxic respiratory failure in setting of untreated lung cancer, possible CHF/pulmonary hypertension contributing.  No signs of pneumonia or sepsis today.  No sign of PE.  10:57 AM I consulted with internal medicine teaching service by telephone.  They will see patient for admission.                            Medical Decision Making Amount and/or Complexity of Data Reviewed Radiology: ordered.  Risk Prescription drug management. Decision regarding hospitalization.   Patient with known lung mass, suspected lung cancer, but has not followed up.  Presents with some symptoms consistent with heart failure today including lower extremity edema, increased cough, orthopnea.  His BNP is mildly elevated, but above his typical baseline.  Some mild edema and pulmonary hypertension noted on CT.  Fortunately no PE today.  Troponins mildly elevated but flat.  EKG unchanged.  He has required more oxygen than his baseline 3 L to maintain saturation above 90%.  Patient will be admitted.         Final Clinical Impression(s) / ED Diagnoses Final diagnoses:  Acute on chronic respiratory failure with hypoxia ([place])  Malignant neoplasm of right lung, unspecified part of lung ([place])  Hypertension, unspecified type    Rx / DC Orders ED Discharge Orders     None         Carlisle Cater, PA-C 12/25/22 1058    Valarie Merino, MD 12/25/22 1538

## 2022-12-25 NOTE — ED Notes (Signed)
Pt reiceved lunch tray.

## 2022-12-25 NOTE — ED Notes (Signed)
Notified MD of sharp pain that hits in patients left side that significantly drops his oxygen

## 2022-12-25 NOTE — ED Notes (Signed)
Walked patient to the bathroom patient did well patient is now back in bed on the monitor with call bell in reach
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
## 2022-12-26 ENCOUNTER — Observation Stay (HOSPITAL_BASED_OUTPATIENT_CLINIC_OR_DEPARTMENT_OTHER): Payer: 59

## 2022-12-26 ENCOUNTER — Observation Stay (HOSPITAL_COMMUNITY): Payer: 59

## 2022-12-26 DIAGNOSIS — C3431 Malignant neoplasm of lower lobe, right bronchus or lung: Secondary | ICD-10-CM | POA: Diagnosis not present

## 2022-12-26 DIAGNOSIS — R0609 Other forms of dyspnea: Secondary | ICD-10-CM | POA: Diagnosis not present

## 2022-12-26 DIAGNOSIS — M7989 Other specified soft tissue disorders: Secondary | ICD-10-CM | POA: Diagnosis not present

## 2022-12-26 DIAGNOSIS — R52 Pain, unspecified: Secondary | ICD-10-CM

## 2022-12-26 DIAGNOSIS — F17210 Nicotine dependence, cigarettes, uncomplicated: Secondary | ICD-10-CM | POA: Diagnosis not present

## 2022-12-26 DIAGNOSIS — J9621 Acute and chronic respiratory failure with hypoxia: Secondary | ICD-10-CM | POA: Diagnosis not present

## 2022-12-26 LAB — ECHOCARDIOGRAM COMPLETE
AR max vel: 4.26 cm2
AV Area VTI: 4.38 cm2
AV Area mean vel: 4.31 cm2
AV Mean grad: 2 mmHg
AV Peak grad: 3.8 mmHg
Ao pk vel: 0.97 m/s
Area-P 1/2: 3.21 cm2
Height: 69 in
P 1/2 time: 409 msec
S' Lateral: 3 cm
Weight: 3600 oz

## 2022-12-26 LAB — CBC WITH DIFFERENTIAL/PLATELET
Abs Immature Granulocytes: 0.03 10*3/uL (ref 0.00–0.07)
Basophils Absolute: 0 10*3/uL (ref 0.0–0.1)
Basophils Relative: 0 %
Eosinophils Absolute: 0 10*3/uL (ref 0.0–0.5)
Eosinophils Relative: 0 %
HCT: 50.1 % (ref 39.0–52.0)
Hemoglobin: 16.6 g/dL (ref 13.0–17.0)
Immature Granulocytes: 0 %
Lymphocytes Relative: 10 %
Lymphs Abs: 0.8 10*3/uL (ref 0.7–4.0)
MCH: 31.7 pg (ref 26.0–34.0)
MCHC: 33.1 g/dL (ref 30.0–36.0)
MCV: 95.6 fL (ref 80.0–100.0)
Monocytes Absolute: 1.2 10*3/uL — ABNORMAL HIGH (ref 0.1–1.0)
Monocytes Relative: 16 %
Neutro Abs: 5.7 10*3/uL (ref 1.7–7.7)
Neutrophils Relative %: 74 %
Platelets: 227 10*3/uL (ref 150–400)
RBC: 5.24 MIL/uL (ref 4.22–5.81)
RDW: 15.3 % (ref 11.5–15.5)
WBC: 7.7 10*3/uL (ref 4.0–10.5)
nRBC: 0 % (ref 0.0–0.2)

## 2022-12-26 LAB — COMPREHENSIVE METABOLIC PANEL
ALT: 12 U/L (ref 0–44)
AST: 21 U/L (ref 15–41)
Albumin: 3.5 g/dL (ref 3.5–5.0)
Alkaline Phosphatase: 70 U/L (ref 38–126)
Anion gap: 10 (ref 5–15)
BUN: 15 mg/dL (ref 6–20)
CO2: 24 mmol/L (ref 22–32)
Calcium: 9 mg/dL (ref 8.9–10.3)
Chloride: 102 mmol/L (ref 98–111)
Creatinine, Ser: 1.17 mg/dL (ref 0.61–1.24)
GFR, Estimated: >60 mL/min (ref >60)
Glucose, Bld: 107 mg/dL — ABNORMAL HIGH (ref 70–99)
Potassium: 4.1 mmol/L (ref 3.5–5.1)
Sodium: 136 mmol/L (ref 135–145)
Total Bilirubin: 0.7 mg/dL (ref 0.3–1.2)
Total Protein: 6.8 g/dL (ref 6.5–8.1)

## 2022-12-26 LAB — LACTATE DEHYDROGENASE: LDH: 226 U/L — ABNORMAL HIGH (ref 98–192)

## 2022-12-26 MED ORDER — IBUPROFEN 200 MG PO TABS: 400.0000 mg | ORAL_TABLET | Freq: Four times a day (QID) | ORAL | Status: DC | PRN

## 2022-12-26 MED ORDER — GADOBUTROL 1 MMOL/ML IV SOLN
10.0000 mL | Freq: Once | INTRAVENOUS | Status: AC | PRN
  Administered 2022-12-26: 10 mL via INTRAVENOUS

## 2022-12-26 NOTE — Progress Notes (Signed)
VASCULAR LAB    Left lower extremity venous duplex has been performed.  See CV proc for preliminary results.   Yohana Bartha, RVT 12/26/2022, 10:33 AM
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
## 2022-12-26 NOTE — Consult Note (Addendum)
NAME:  Richard [name], MRN:  [id number], DOB:  06-08-1971, LOS: 0 ADMISSION DATE:  12/25/2022, CONSULTATION DATE:  12/26/22 REFERRING MD:  Dr. Criselda Peaches, CHIEF COMPLAINT:  Chest Discomfort, Hypoxia    History of Present Illness:  52 y/o M, smoker, initially evaluated by pulmonary in April 2021 with a right lung mass consistent with lung cancer.  He does carry a history of COPD, seizures, right lower extremity DVT and alcohol abuse.  Patient has been difficult to get into the pulmonary clinic and has had multiple no-shows and AMA from the emergency room.  Initial mass was noted on CT angio in March 2021 with a 2.6 cm superior segment mass of the right lower lobe, right upper lobe nodule of 1.2 cm.  In February 2023 the mass was hypermetabolic on PET imaging and significantly larger.  On 02/10/2022 he underwent an EBUS with transbronchial biopsy of 4R and 7 with multiple passes.  RLL brushing was suspicious for malignancy but definitive diagnosis was not obtained.  He was seen by radiation oncology and started on radiation therapy from February 22 through February 23, 2022.  However was lost to follow-up and did not follow-up with oncology, pulmonary or radiation oncology.  He returned to the ER on 1/12 with reports of chest discomfort.  He was found to be hypoxemic with saturations in the high 70s on his baseline oxygen of 3 L.  Initial ER workup notable for 5 L O2 need.  VBG notable for pH 7.38 and pCO2 38.  Viral screening negative for influenza, RSV and COVID.  CT angio of the chest was negative for PE but showed stable main pulmonary artery dilation consistent with pulmonary hypertension, slight enlargement of the posterior right upper lobe mass and satellite nodule more superiorly in the right upper lobe stable in size, mild pulmonary interstitial edema without effusions.  CT imaging concerning for progression of malignancy.  The patient was admitted per teaching service and pulmonary medicine consulted for  evaluation.  Pertinent  Medical History  CVA / ICH Seizures  ETOH Abuse  DVT - RLE 2016 Asthma  Chronic Hypoxic Respiratory Failure - 3L O2 baseline  Posterior RUL Mass HTN   Significant Hospital Events: Including procedures, antibiotic start and stop dates in addition to other pertinent events   1/12 Admit 1/13 PCCM consulted   Interim History / Subjective:  As above Afebrile 3 L O2  Objective   Blood pressure 114/76, pulse 98, temperature (!) 97.5 F (36.4 C), temperature source Oral, resp. rate 15, height 5\' 9"  (1.753 m), weight 102.1 kg, SpO2 90 %.        Intake/Output Summary (Last 24 hours) at 12/26/2022 1239 Last data filed at 12/26/2022 0600 Gross per 24 hour  Intake --  Output 1000 ml  Net -1000 ml   Filed Weights   12/25/22 0517  Weight: 102.1 kg    Examination: General: adult male sitting up in bed, wife at bedside  HENT: MM pink/moist, poor dentition, [place] O2 Lungs: non-labored at rest, lungs bilaterally diminished Cardiovascular: s1s2 RRR, no MRG Abdomen: non-labored at rest, lungs bilaterally clear Extremities: warm/dry, LE 2+ edema Neuro: AAOx4, speech clear, MAE PSY: patient angry, frustrated, pressured speech at times  Resolved Hospital Problem list     Assessment & Plan:   RUL Lung Mass  Initially found in 02/2020, enlarging in size. Prior EBUS with biopsy with no definitive diagnosis. Suspected initial stage IIIb, now locally advanced at least IIIc of right lung.  -Dr. 03/2020 consulted  per primary team  -port-a-cath pending for Monday (pt states he was not aware) -further surveillance imaging per Dr. Myna Hidalgo  -IR needle biopsy for possible diagnosis on Monday, appreciate assistance  -see below   Emphysema / COPD  Tobacco Abuse  -continue Dulera -duoneb Q6 PRN  -offered nicotine patch and patient refused  -smoking cessation encouraged   ETOH Abuse  -per primary  Global:  Patient was under the impression that he had been told in  the past that his cancer "had been cured" and that he "finished treatment".  We discussed at length the process for biopsy, false negatives / need for repeat biopsies at times, potential treatment. He asks about surgery to remove the cancer, we reviewed that he is not a candidate for surgical removal due to suspected staging of his cancer.  He states he is tired of "running here, there and everywhere" for this problem. He has family he needs to take care of at home. He states he feels he is imprisoned here and wants to go outside-largely to smoke. He feels we should be able to cure him.  Reviewed that his cancer is not curable but there are therapy options. Discussed process of tissue biopsy and matching therapy to type of cancer.   By the end of the conversation, he seemed to be on board with moving forward with biopsy and getting information to make further decisions. Very clearly, we reviewed that if he did nothing, that this will take his life at some point.  Wife present for conversation.  Offered support to patient and family.   Best Practice (right click and "Reselect all SmartList Selections" daily)  Per primary service  Labs   CBC: Recent Labs  Lab 12/25/22 0525 12/25/22 0709  WBC 8.4  --   HGB 17.8* 18.4*  HCT 54.4* 54.0*  MCV 97.8  --   PLT 250  --     Basic Metabolic Panel: Recent Labs  Lab 12/25/22 0525 12/25/22 0709  NA 138 138  K 4.2 4.6  CL 103  --   CO2 19*  --   GLUCOSE 80  --   BUN 6  --   CREATININE 0.89  --   CALCIUM 9.2  --    GFR: Estimated Creatinine Clearance: 114.4 mL/min (by C-G formula based on SCr of 0.89 mg/dL). Recent Labs  Lab 12/25/22 0525  WBC 8.4    Liver Function Tests: No results for input(s): "AST", "ALT", "ALKPHOS", "BILITOT", "PROT", "ALBUMIN" in the last 168 hours. No results for input(s): "LIPASE", "AMYLASE" in the last 168 hours. No results for input(s): "AMMONIA" in the last 168 hours.  ABG    Component Value Date/Time    PHART 7.375 02/23/2015 0849   PCO2ART 53.9 (H) 02/23/2015 0849   PO2ART 102.0 (H) 02/23/2015 0849   HCO3 23.3 12/25/2022 0709   TCO2 24 12/25/2022 0709   ACIDBASEDEF 1.0 12/25/2022 0709   O2SAT 73 12/25/2022 0709     Coagulation Profile: No results for input(s): "INR", "PROTIME" in the last 168 hours.  Cardiac Enzymes: No results for input(s): "CKTOTAL", "CKMB", "CKMBINDEX", "TROPONINI" in the last 168 hours.  HbA1C: Hgb A1c MFr Bld  Date/Time Value Ref Range Status  10/13/2021 06:44 AM 6.1 (H) 4.8 - 5.6 % Final    Comment:    (NOTE) Pre diabetes:          5.7%-6.4%  Diabetes:              >6.4%  Glycemic control  for   <7.0% adults with diabetes   12/24/2013 06:39 AM 6.1 (H) <5.7 % Final    Comment:    (NOTE)                                                                       According to the ADA Clinical Practice Recommendations for 2011, when HbA1c is used as a screening test:  >=6.5%   Diagnostic of Diabetes Mellitus           (if abnormal result is confirmed) 5.7-6.4%   Increased risk of developing Diabetes Mellitus References:Diagnosis and Classification of Diabetes Mellitus,Diabetes Care,2011,34(Suppl 1):S62-S69 and Standards of Medical Care in         Diabetes - 2011,Diabetes Care,2011,34 (Suppl 1):S11-S61.    CBG: No results for input(s): "GLUCAP" in the last 168 hours.  Review of Systems: Positives in [place]   Gen: Denies fever, chills, weight change, fatigue, night sweats HEENT: Denies blurred vision, double vision, hearing loss, tinnitus, sinus congestion, rhinorrhea, sore throat, neck stiffness, dysphagia PULM: Denies shortness of breath, chest discomfort, low oxygen saturations, cough, sputum production, hemoptysis, wheezing CV: Denies chest pain, edema, orthopnea, paroxysmal nocturnal dyspnea, palpitations GI: Denies abdominal pain, nausea, vomiting, diarrhea, hematochezia, melena, constipation, change in bowel habits GU: Denies dysuria, hematuria,  polyuria, oliguria, urethral discharge Endocrine: Denies hot or cold intolerance, polyuria, polyphagia or appetite change Derm: Denies rash, dry skin, scaling or peeling skin change Heme: Denies easy bruising, bleeding, bleeding gums Neuro: Denies headache, numbness, weakness, slurred speech, loss of memory or consciousness  Past Medical History:  He,  has a past medical history of Asthma, Brain bleed (HCC), DVT (deep venous thrombosis) (HCC) (02/19/2015), GERD (gastroesophageal reflux disease), History of home oxygen therapy, Hypertension, Seizures (HCC), and Stroke (HCC).   Surgical History:   Past Surgical History:  Procedure Laterality Date   BRONCHIAL BIOPSY  02/10/2022   Procedure: BRONCHIAL BIOPSIES;  Surgeon: Oretha Milch, MD;  Location: [facility];  Service: Cardiopulmonary;;   BRONCHIAL BRUSHINGS  02/10/2022   Procedure: BRONCHIAL BRUSHINGS;  Surgeon: Oretha Milch, MD;  Location: [facility];  Service: Cardiopulmonary;;   BRONCHIAL NEEDLE ASPIRATION BIOPSY  02/10/2022   Procedure: BRONCHIAL NEEDLE ASPIRATION BIOPSIES;  Surgeon: Oretha Milch, MD;  Location: [facility];  Service: Cardiopulmonary;;   BRONCHIAL WASHINGS  02/10/2022   Procedure: BRONCHIAL WASHINGS;  Surgeon: Oretha Milch, MD;  Location: [facility];  Service: Cardiopulmonary;;   ENDOBRONCHIAL ULTRASOUND Bilateral 02/10/2022   Procedure: ENDOBRONCHIAL ULTRASOUND;  Surgeon: Oretha Milch, MD;  Location: [facility];  Service: Cardiopulmonary;  Laterality: Bilateral;   LEG SURGERY     VIDEO BRONCHOSCOPY  02/10/2022   Procedure: VIDEO BRONCHOSCOPY WITHOUT FLUORO;  Surgeon: Oretha Milch, MD;  Location: [facility];  Service: Cardiopulmonary;;     Social History:   reports that he has been smoking cigarettes. He started smoking about 37 years ago. He has a 34.00 pack-year smoking history. He has never used smokeless tobacco. He reports current alcohol use. He reports that he does not currently use drugs after  having used the following drugs: Cocaine.   Family History:  His family history includes Cancer in his father; Diabetes Mellitus II in his sister.   Allergies Allergies  Allergen Reactions   Dilaudid [Hydromorphone Hcl] Nausea Only and Other (See Comments)    Bradycardia and hyperthermia, too   Tape Itching and Dermatitis     Home Medications  Prior to Admission medications   Medication Sig Start Date End Date Taking? Authorizing Provider  albuterol (VENTOLIN HFA) 108 (90 Base) MCG/ACT inhaler Inhale 1-2 puffs into the lungs every 6 (six) hours as needed for wheezing or shortness of breath. Patient taking differently: Inhale 2 puffs into the lungs as needed for wheezing or shortness of breath. 06/03/22  Yes Parrett, Tammy S, NP  [name] 160-4.5 MCG/ACT inhaler Inhale 2 puffs into the lungs daily. 12/22/22  Yes [provider]  cloNIDine (CATAPRES) 0.3 MG tablet Take 1 tablet (0.3 mg total) by mouth 2 (two) times daily. 02/11/22  Yes Albertine Grates, MD  folic acid (FOLVITE) 1 MG tablet Take 1 tablet (1 mg total) by mouth daily. Patient not taking: Reported on 02/02/2022 10/15/21   Joycelyn Das, MD  levETIRAcetam (KEPPRA) 500 MG tablet Take 500 mg by mouth 2 (two) times daily. Patient not taking: Reported on 06/03/2022    [provider]  lidocaine (LIDODERM) 5 % Place 1 patch onto the skin daily. Remove & Discard patch within 12 hours or as directed by MD Patient not taking: Reported on 02/02/2022 07/11/20   Petrucelli, Lelon Mast R, PA-C  losartan (COZAAR) 25 MG tablet Take 1 tablet (25 mg total) by mouth daily. Patient not taking: Reported on 06/03/2022 02/11/22   Albertine Grates, MD  methocarbamol (ROBAXIN) 500 MG tablet Take 1 tablet (500 mg total) by mouth every 8 (eight) hours as needed for muscle spasms. Patient not taking: Reported on 02/02/2022 07/11/20   Petrucelli, Lelon Mast R, PA-C  Multiple Vitamin (MULTIVITAMIN WITH MINERALS) TABS tablet Take 1 tablet by mouth daily. Patient not  taking: Reported on 02/02/2022 10/15/21   Joycelyn Das, MD  OXYGEN Inhale 2 L/min into the lungs as needed (for shortness of breath).    [provider]  Phenytoin (DILANTIN PO) Take 1 capsule by mouth in the morning and at bedtime. Patient not taking: Reported on 06/03/2022    [provider]  thiamine 100 MG tablet Take 1 tablet (100 mg total) by mouth daily. Patient not taking: Reported on 02/02/2022 10/15/21   Joycelyn Das, MD  vitamin B-12 (CYANOCOBALAMIN) 1000 MCG tablet Take 1 tablet (1,000 mcg total) by mouth daily. Patient not taking: Reported on 06/03/2022 03/04/20   Tyrone Nine, MD     Critical care time: n/a     Canary Brim, MSN, APRN, NP-C, AGACNP-BC [facility] 12/26/2022, 12:39 PM   Please see [email or web address] for pager details.   From 7A-7P if no response, please call [phone] After hours, please call ELink [phone]

## 2022-12-27 DIAGNOSIS — R918 Other nonspecific abnormal finding of lung field: Secondary | ICD-10-CM | POA: Diagnosis not present

## 2022-12-27 DIAGNOSIS — J9621 Acute and chronic respiratory failure with hypoxia: Secondary | ICD-10-CM | POA: Diagnosis not present

## 2022-12-27 DIAGNOSIS — F17210 Nicotine dependence, cigarettes, uncomplicated: Secondary | ICD-10-CM | POA: Diagnosis not present

## 2022-12-27 LAB — COMPREHENSIVE METABOLIC PANEL
ALT: 13 U/L (ref 0–44)
AST: 16 U/L (ref 15–41)
Albumin: 3.1 g/dL — ABNORMAL LOW (ref 3.5–5.0)
Alkaline Phosphatase: 59 U/L (ref 38–126)
Anion gap: 10 (ref 5–15)
BUN: 14 mg/dL (ref 6–20)
CO2: 21 mmol/L — ABNORMAL LOW (ref 22–32)
Calcium: 8.7 mg/dL — ABNORMAL LOW (ref 8.9–10.3)
Chloride: 107 mmol/L (ref 98–111)
Creatinine, Ser: 0.96 mg/dL (ref 0.61–1.24)
GFR, Estimated: >60 mL/min (ref >60)
Glucose, Bld: 122 mg/dL — ABNORMAL HIGH (ref 70–99)
Potassium: 3.9 mmol/L (ref 3.5–5.1)
Sodium: 138 mmol/L (ref 135–145)
Total Bilirubin: 0.5 mg/dL (ref 0.3–1.2)
Total Protein: 6 g/dL — ABNORMAL LOW (ref 6.5–8.1)

## 2022-12-27 LAB — CBC
HCT: 46.4 % (ref 39.0–52.0)
Hemoglobin: 15.5 g/dL (ref 13.0–17.0)
MCH: 32.1 pg (ref 26.0–34.0)
MCHC: 33.4 g/dL (ref 30.0–36.0)
MCV: 96.1 fL (ref 80.0–100.0)
Platelets: 214 10*3/uL (ref 150–400)
RBC: 4.83 MIL/uL (ref 4.22–5.81)
RDW: 15.2 % (ref 11.5–15.5)
WBC: 7.5 10*3/uL (ref 4.0–10.5)
nRBC: 0 % (ref 0.0–0.2)

## 2022-12-27 NOTE — Progress Notes (Addendum)
Chief Complaint: Patient was seen in consultation today for lung mass biopsy.   Referring Physician(s): Dr. Delton Coombes Dr. Myna Hidalgo  Supervising Physician: Ruel Favors  Patient Status: [facility] - In-pt  History of Present Illness: Richard [name] is a 52 y.o. male with large right lung mass. Prior hx of EBUS/bx in Feb 2023 with malignant cells but apparently not differentiated. Underwent radiation and then was lost to follow up. Pt states he was told he was 'cancer free'  He has now been readmitted with SOB and right lung mass again noted. Noted to be increased in size as well as abutting the posterior pleura. PCCM was consulted for consideration of repeat bronch for tissue sampling. IR is asked to consider percutaneous biopsy. PMHx, meds, labs, imaging, allergies reviewed.     Past Medical History:  Diagnosis Date   Asthma    Brain bleed (HCC)    DVT (deep venous thrombosis) (HCC) 02/19/2015   RLE   GERD (gastroesophageal reflux disease)    History of home oxygen therapy    Hypertension    Seizures (HCC)    last episode 03/2013   Stroke [facility])     Past Surgical History:  Procedure Laterality Date   BRONCHIAL BIOPSY  02/10/2022   Procedure: BRONCHIAL BIOPSIES;  Surgeon: Oretha Milch, MD;  Location: [facility] ENDOSCOPY;  Service: Cardiopulmonary;;   BRONCHIAL BRUSHINGS  02/10/2022   Procedure: BRONCHIAL BRUSHINGS;  Surgeon: Oretha Milch, MD;  Location: [facility]OS[facility];  Service: Cardiopulmonary;;   BRONCHIAL NEEDLE ASPIRATION BIOPSY  02/10/2022   Procedure: BRONCHIAL NEEDLE ASPIRATION BIOPSIES;  Surgeon: Oretha Milch, MD;  Location: [facility];  Service: Cardiopulmonary;;   BRONCHIAL WASHINGS  02/10/2022   Procedure: BRONCHIAL WASHINGS;  Surgeon: Oretha Milch, MD;  Location: [facility];  Service: Cardiopulmonary;;   ENDOBRONCHIAL ULTRASOUND Bilateral 02/10/2022   Procedure: ENDOBRONCHIAL ULTRASOUND;  Surgeon: Oretha Milch, MD;  Location: [facility] ENDOS[facility];  Service: Cardiopulmonary;   Laterality: Bilateral;   LEG SURGERY     VIDEO BRONCHOSCOPY  02/10/2022   Procedure: VIDEO BRONCHOSCOPY WITHOUT FLUORO;  Surgeon: Oretha Milch, MD;  Location: [facility] ENDOSCOPY;  Service: Cardiopulmonary;;    Allergies: Dilaudid [hydromorphone hcl] and Tape  Medications:  Current Facility-Administered Medications:    acetaminophen (TYLENOL) tablet 650 mg, 650 mg, Oral, Q6H PRN **OR** acetaminophen (TYLENOL) suppository 650 mg, 650 mg, Rectal, Q6H PRN, Kirke Corin, Flossie Buffy, MD   albuterol (PROVENTIL) (2.5 MG/3ML) 0.083% nebulizer solution 3 mL, 3 mL, Inhalation, PRN, Steffanie Rainwater, MD   cloNIDine (CATAPRES) tablet 0.3 mg, 0.3 mg, Oral, BID, Steffanie Rainwater, MD, 0.3 mg at 12/27/22 1013   enoxaparin (LOVENOX) injection 50 mg, 0.5 mg/kg, Subcutaneous, Q24H, [place], Flossie Buffy, MD, 50 mg at 12/26/22 2107   ibuprofen (ADVIL) tablet 400 mg, 400 mg, Oral, Q6H PRN, Crissie Sickles, MD   ipratropium-albuterol (DUONEB) 0.5-2.5 (3) MG/3ML nebulizer solution 3 mL, 3 mL, Nebulization, Q6H PRN, Steffanie Rainwater, MD   levETIRAcetam (KEPPRA) tablet 500 mg, 500 mg, Oral, BID, Steffanie Rainwater, MD, 500 mg at 12/27/22 1013   losartan (COZAAR) tablet 25 mg, 25 mg, Oral, Daily, Steffanie Rainwater, MD, 25 mg at 12/27/22 1013   mometasone-formoterol (DULERA) 200-5 MCG/ACT inhaler 2 puff, 2 puff, Inhalation, BID, Steffanie Rainwater, MD, 2 puff at 12/27/22 0846   ondansetron (ZOFRAN) tablet 4 mg, 4 mg, Oral, Q6H PRN **OR** ondansetron (ZOFRAN) injection 4 mg, 4 mg, Intravenous, Q6H PRN, Kirke Corin, Flossie Buffy, MD   pneumococcal 20-valent conjugate vaccine (PREVNAR  20) injection 0.5 mL, 0.5 mL, Intramuscular, Prior to discharge, Mercie Eon, MD   senna-docusate (Senokot-S) tablet 1 tablet, 1 tablet, Oral, QHS PRN, Kirke Corin, Flossie Buffy, MD    Family History  Problem Relation Age of Onset   Cancer Father    Diabetes Mellitus II Sister     Social History   Socioeconomic History   Marital status:  Married    Spouse name: Misty Stanley   Number of children: 1   Years of education: HS   Highest education level: Not on file  Occupational History    Employer: OTHER    Comment: n/a  Tobacco Use   Smoking status: Every Day    Packs/day: 1.00    Years: 34.00    Total pack years: 34.00    Types: Cigarettes    Start date: 1987   Smokeless tobacco: Never   Tobacco comments:    1 pack smoked daily ARJ 01/28/22  Vaping Use   Vaping Use: Never used  Substance and Sexual Activity   Alcohol use: Yes    Comment: 01/29/22- last time 6 days ago   Drug use: Not Currently    Types: Cocaine    Comment: 09/2021- last time for cocaine   Sexual activity: Yes  Other Topics Concern   Not on file  Social History Narrative   Patient lives at home alone.   Caffeine Use: 4-5 cups daily   Social Determinants of Health   Financial Resource Strain: Not on file  Food Insecurity: Food Insecurity Present (08/28/2022)   Hunger Vital Sign    Worried About Running Out of Food in the Last Year: Sometimes true    Ran Out of Food in the Last Year: Sometimes true  Transportation Needs: Unmet Transportation Needs (08/28/2022)   PRAPARE - Administrator, Civil Service (Medical): Yes    Lack of Transportation (Non-Medical): Yes  Physical Activity: Not on file  Stress: Not on file  Social Connections: Not on file    Review of Systems: A 12 point ROS discussed and pertinent positives are indicated in the HPI above.  All other systems are negative.  Review of Systems  Vital Signs: BP (!) 131/91 (BP Location: Right Arm)   Pulse 84   Temp 98.5 F (36.9 C) (Oral)   Resp 18   Ht 5\' 9"  (1.753 m)   Wt 225 lb (102.1 kg)   SpO2 96%   BMI 33.23 kg/m   Physical Exam Constitutional:      Appearance: He is not ill-appearing.  HENT:     Mouth/Throat:     Mouth: Mucous membranes are moist.     Pharynx: Oropharynx is clear.  Cardiovascular:     Rate and Rhythm: Normal rate and regular rhythm.      Heart sounds: Normal heart sounds.  Pulmonary:     Effort: Pulmonary effort is normal. No respiratory distress.     Breath sounds: Normal breath sounds.  Neurological:     General: No focal deficit present.     Mental Status: He is alert and oriented to person, place, and time.  Psychiatric:        Thought Content: Thought content normal.        Judgment: Judgment normal.      Imaging: MR BRAIN W WO CONTRAST  Result Date: 12/26/2022 CLINICAL DATA:  Lung cancer staging EXAM: MRI HEAD WITHOUT AND WITH CONTRAST TECHNIQUE: Multiplanar, multiecho pulse sequences of the brain and surrounding structures were obtained without and  with intravenous contrast. CONTRAST:  47mL GADAVIST GADOBUTROL 1 MMOL/ML IV SOLN COMPARISON:  02/03/2022 FINDINGS: Brain: No enhancement or swelling to suggest metastatic disease. Extensive chronic small vessel ischemia in the cerebral white matter, confluent around the lateral ventricles. Unchanged large area of encephalomalacia in the anterior left frontal lobe. Stable brain volume. No acute infarct, hydrocephalus, or collection. Vascular: Major flow voids and vascular enhancements are preserved Skull and upper cervical spine: Curved no focal marrow lesion. Sinuses/Orbits: Chronic left maxillary sinusitis with wall thickening and complete opacification. Negative orbits. IMPRESSION: Stable compared to February 2023. Negative for metastatic disease to the brain. Electronically Signed   By: Jorje Guild M.D.   On: 12/26/2022 17:19   VAS [place] LOWER EXTREMITY VENOUS (DVT)  Result Date: 12/26/2022  Lower Venous DVT Study Patient Name:  AUGUSTO DECKMAN  Date of Exam:   12/26/2022 Medical Rec #: [id number]     Accession #:    [id number] Date of Birth: 1971-03-14     Patient Gender: M Patient Age:   63 years Exam Location:  [facility] Procedure:      VAS [place] LOWER EXTREMITY VENOUS (DVT) Referring Phys: JULIE MACHEN  --------------------------------------------------------------------------------  Indications: Swelling, and Pain.  Risk Factors: Cancer of the lung diagnosed 2021 no follow up. Comparison Study: Prior negative bilateral LEV done 02/03/22 Performing Technologist: Sharion Dove RVS  Examination Guidelines: A complete evaluation includes B-mode imaging, spectral Doppler, color Doppler, and power Doppler as needed of all accessible portions of each vessel. Bilateral testing is considered an integral part of a complete examination. Limited examinations for reoccurring indications may be performed as noted. The reflux portion of the exam is performed with the patient in reverse Trendelenburg.  +-----+---------------+---------+-----------+----------+--------------+ RIGHTCompressibilityPhasicitySpontaneityPropertiesThrombus Aging +-----+---------------+---------+-----------+----------+--------------+ CFV  Full           Yes      Yes                                 +-----+---------------+---------+-----------+----------+--------------+   +---------+---------------+---------+-----------+----------+--------------+ LEFT     CompressibilityPhasicitySpontaneityPropertiesThrombus Aging +---------+---------------+---------+-----------+----------+--------------+ CFV      Full           Yes      Yes                                 +---------+---------------+---------+-----------+----------+--------------+ SFJ      Full                                                        +---------+---------------+---------+-----------+----------+--------------+ FV Prox  Full                                                        +---------+---------------+---------+-----------+----------+--------------+ FV Mid   Full                                                        +---------+---------------+---------+-----------+----------+--------------+  FV DistalFull                                                         +---------+---------------+---------+-----------+----------+--------------+ PFV      Full                                                        +---------+---------------+---------+-----------+----------+--------------+ POP      Full           Yes      Yes                                 +---------+---------------+---------+-----------+----------+--------------+ PTV      Full                                                        +---------+---------------+---------+-----------+----------+--------------+ PERO     Full                                                        +---------+---------------+---------+-----------+----------+--------------+     Summary: RIGHT: - No evidence of common femoral vein obstruction.  LEFT: - There is no evidence of deep vein thrombosis in the lower extremity.  - No cystic structure found in the popliteal fossa.  *See table(s) above for measurements and observations. Electronically signed by Sherald Hess MD on 12/26/2022 at 11:45:54 AM.    Final    ECHOCARDIOGRAM COMPLETE  Result Date: 12/26/2022    ECHOCARDIOGRAM REPORT   Patient Name:   Chanceler Pullin Date of Exam: 12/26/2022 Medical Rec #:  [id number]    Height:       69.0 in Accession #:    [id number]   Weight:       225.0 lb Date of Birth:  June 05, 1971    BSA:          2.172 m Patient Age:    51 years     BP:           114/76 mmHg Patient Gender: M            HR:           92 bpm. Exam Location:  Inpatient Procedure: 2D Echo, Cardiac Doppler and Color Doppler Indications:    Dyspnea  History:        Patient has prior history of Echocardiogram examinations, most                 recent 02/04/2022. COPD; Risk Factors:Hypertension and Current                 Smoker. Lung cancer. Hx TIA.  Sonographer:    Ross Ludwig RDCS (AE) Referring Phys: Flossie Buffy AMPONSAH IMPRESSIONS  1. Left ventricular ejection fraction, by estimation, is 60  to 65%. The left ventricle has normal function. The  left ventricle has no regional wall motion abnormalities. There is moderate concentric left ventricular hypertrophy. Left ventricular diastolic parameters are consistent with Grade I diastolic dysfunction (impaired relaxation).  2. Right ventricular systolic function is normal. The right ventricular size is normal.  3. The mitral valve is normal in structure. No evidence of mitral valve regurgitation. No evidence of mitral stenosis.  4. The aortic valve has an indeterminant number of cusps. Aortic valve regurgitation is mild. No aortic stenosis is present.  5. The inferior vena cava is normal in size with greater than 50% respiratory variability, suggesting right atrial pressure of 3 mmHg. Comparison(s): No significant change from prior study. FINDINGS  Left Ventricle: Left ventricular ejection fraction, by estimation, is 60 to 65%. The left ventricle has normal function. The left ventricle has no regional wall motion abnormalities. The left ventricular internal cavity size was normal in size. There is  moderate concentric left ventricular hypertrophy. Left ventricular diastolic parameters are consistent with Grade I diastolic dysfunction (impaired relaxation). Right Ventricle: The right ventricular size is normal. Right ventricular systolic function is normal. Left Atrium: Left atrial size was normal in size. Right Atrium: Right atrial size was normal in size. Pericardium: Trivial pericardial effusion is present. Mitral Valve: The mitral valve is normal in structure. No evidence of mitral valve regurgitation. No evidence of mitral valve stenosis. Tricuspid Valve: The tricuspid valve is normal in structure. Tricuspid valve regurgitation is trivial. No evidence of tricuspid stenosis. Aortic Valve: The aortic valve has an indeterminant number of cusps. Aortic valve regurgitation is mild. Aortic regurgitation PHT measures 409 msec. No aortic stenosis is present. Aortic valve mean gradient measures 2.0 mmHg. Aortic  valve peak gradient measures 3.8 mmHg. Aortic valve area, by VTI measures 4.38 cm. Pulmonic Valve: The pulmonic valve was normal in structure. Pulmonic valve regurgitation is not visualized. No evidence of pulmonic stenosis. Aorta: The aortic root is normal in size and structure. Venous: The inferior vena cava is normal in size with greater than 50% respiratory variability, suggesting right atrial pressure of 3 mmHg. IAS/Shunts: No atrial level shunt detected by color flow Doppler.  LEFT VENTRICLE PLAX 2D LVIDd:         4.30 cm   Diastology LVIDs:         3.00 cm   LV e' medial:    4.79 cm/s LV PW:         2.00 cm   LV E/e' medial:  13.5 LV IVS:        1.40 cm   LV e' lateral:   7.72 cm/s LVOT diam:     2.30 cm   LV E/e' lateral: 8.4 LV SV:         71 LV SV Index:   33 LVOT Area:     4.15 cm  RIGHT VENTRICLE             IVC RV Basal diam:  3.30 cm     IVC diam: 1.50 cm RV S prime:     16.50 cm/s TAPSE (M-mode): 1.9 cm LEFT ATRIUM             Index        RIGHT ATRIUM           Index LA diam:        2.80 cm 1.29 cm/m   RA Area:     13.40 cm LA Vol (A2C):   35.6 ml 16.39  ml/m  RA Volume:   32.70 ml  15.05 ml/m LA Vol (A4C):   28.3 ml 13.03 ml/m LA Biplane Vol: 32.2 ml 14.82 ml/m  AORTIC VALVE AV Area (Vmax):    4.26 cm AV Area (Vmean):   4.31 cm AV Area (VTI):     4.38 cm AV Vmax:           97.00 cm/s AV Vmean:          66.700 cm/s AV VTI:            0.163 m AV Peak Grad:      3.8 mmHg AV Mean Grad:      2.0 mmHg LVOT Vmax:         99.50 cm/s LVOT Vmean:        69.200 cm/s LVOT VTI:          0.172 m LVOT/AV VTI ratio: 1.06 AI PHT:            409 msec  AORTA Ao Root diam: 3.90 cm MITRAL VALVE MV Area (PHT): 3.21 cm    SHUNTS MV Decel Time: 236 msec    Systemic VTI:  0.17 m MV E velocity: 64.60 cm/s  Systemic Diam: 2.30 cm MV A velocity: 98.50 cm/s MV E/A ratio:  0.66 Olga Millers MD Electronically signed by Olga Millers MD Signature Date/Time: 12/26/2022/11:34:31 AM    Final    CT Angio Chest PE W  and/or Wo Contrast  Result Date: 12/25/2022 CLINICAL DATA:  Shortness of breath and hypoxia. History of lung carcinoma. EXAM: CT ANGIOGRAPHY CHEST WITH CONTRAST TECHNIQUE: Multidetector CT imaging of the chest was performed using the standard protocol during bolus administration of intravenous contrast. Multiplanar CT image reconstructions and MIPs were obtained to evaluate the vascular anatomy. RADIATION DOSE REDUCTION: This exam was performed according to the departmental dose-optimization program which includes automated exposure control, adjustment of the mA and/or kV according to patient size and/or use of iterative reconstruction technique. CONTRAST:  68mL OMNIPAQUE IOHEXOL 350 MG/ML SOLN COMPARISON:  CT of the chest on 06/10/2022 and prior CTA on 02/02/2022 FINDINGS: Cardiovascular: Pulmonary arteries are adequately opacified. There is no evidence of pulmonary embolism. Stable main pulmonary artery dilatation measuring up to 3.6 cm. Stable dilated ascending thoracic aorta measuring up to 4.3 cm. No evidence of aortic dissection. Normal heart size. No visualized calcified coronary artery plaque. No pericardial fluid identified. Mediastinum/Nodes: AP window node on image 49 shows enlargement measuring 11 mm in short axis compared to 8 mm previously. Right lower paratracheal lymph node demonstrates interval enlargement measuring approximately 12 mm in short axis compared to 10 mm previously. Subcarinal lymph node difficult to measure as it abuts tumor but likely stable in size measuring roughly 15 mm. There are multiple other scattered smaller mediastinal nodes. Lungs/Pleura: Posterior right upper lobe mass abutting the posterior hilum and posterior mediastinum may be slightly increased in size since the prior study with current measured dimensions of approximately 7.9 x 4.1 x 4.9 cm (previously 7.2 x 4.1 x 4.8 cm). The satellite nodule more superiorly in the right upper lobe appears stable in size measuring  approximately 1 cm. No further definite visualized smaller nodules in the right upper lobe. Lungs demonstrate generalized septal thickening since the prior study with scattered areas of ground-glass opacity and associated pulmonary venous distension. Findings are suggestive of mild pulmonary interstitial edema. No associated pleural fluid, focal airspace consolidation or pneumothorax. Upper Abdomen: Stable low-density left adrenal adenoma measuring up to roughly 2.7  cm. This has previously been demonstrated to have long-term stability and does not require follow-up. Musculoskeletal: No bony lesions or fractures identified. Review of the MIP images confirms the above findings. IMPRESSION: 1. No evidence of pulmonary embolism. 2. Stable main pulmonary artery dilatation consistent with pulmonary hypertension. 3. Stable dilated ascending thoracic aorta measuring 4.3 cm. Recommend annual imaging followup by CTA or MRA. This recommendation follows 2010 ACCF/AHA/AATS/ACR/ASA/SCA/SCAI/SIR/STS/SVM Guidelines for the Diagnosis and Management of Patients with Thoracic Aortic Disease. Circulation. 2010; 121: Y606-T016. Aortic aneurysm NOS (ICD10-I71.9) 4. Mild pulmonary interstitial edema without pleural effusions. 5. Slight enlargement of the posterior right upper lobe mass abutting the posterior hilum and posterior mediastinum. The satellite nodule more superiorly in the right upper lobe appears stable in size measuring approximately 1 cm. No further definite visualized smaller nodules in the right upper lobe. 6. Interval enlargement of AP window and right lower paratracheal lymph nodes since the prior study. This finding as well as slight enlargement of the right perihilar upper lobe mass is suggestive of progression of malignancy. Recommend follow-up with Oncology and consideration of additional follow-up PET imaging. 7. Stable low-density left adrenal adenoma. This has previously been demonstrated to have long-term  stability and does not require follow-up. Electronically Signed   By: Aletta Edouard M.D.   On: 12/25/2022 08:29   DG Chest 2 View  Result Date: 12/25/2022 CLINICAL DATA:  52 year old male with history of chest pain and shortness of breath. EXAM: CHEST - 2 VIEW COMPARISON:  Chest x-ray 02/02/2022.  Chest CT 06/10/2022. FINDINGS: When compared to the prior chest radiograph the right upper lobe pulmonary mass appears slightly regressed, but there is persistent soft tissue fullness in the medial right upper lobe as well as the right suprahilar region, with surrounding interstitial prominence and architectural distortion, likely to reflect a combination of evolving postradiation changes, and potentially residual tumor (difficult to assess on today's plain film examination). Left lung is clear. No pleural effusions. No pneumothorax. No evidence of pulmonary edema. Heart size is mildly enlarged. IMPRESSION: 1. No definite radiographic evidence of acute cardiopulmonary disease. 2. Evidence of treated right upper lobe neoplasm. 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 Residual neoplasm is not excluded. Follow-up contrast-enhanced chest CT should be considered in the near future when clinically appropriate to re-evaluate these findings. Electronically Signed   By: Vinnie Langton M.D.   On: 12/25/2022 05:46    Labs:  CBC: Recent Labs    02/10/22 0308 12/25/22 0525 12/25/22 0709 12/26/22 1553 12/27/22 0242  WBC 8.3 8.4  --  7.7 7.5  HGB 14.4 17.8* 18.4* 16.6 15.5  HCT 45.5 54.4* 54.0* 50.1 46.4  PLT 325 250  --  227 214    COAGS: Recent Labs    02/04/22 0630 02/10/22 0308  INR 1.0 0.9    BMP: Recent Labs    02/10/22 0308 06/03/22 1203 12/25/22 0525 12/25/22 0709 12/26/22 1553 12/27/22 0242  NA 135 139 138 138 136 138  K 4.0 4.2 4.2 4.6 4.1 3.9  CL 99 104 103  --  102 107  CO2 30 29 19*  --  24 21*  GLUCOSE 117* 82 80  --  107* 122*  BUN  21* 6 6  --  15 14  CALCIUM 9.4 9.6 9.2  --  9.0 8.7*  CREATININE 0.76 0.90 0.89  --  1.17 0.96  GFRNONAA >60  --  >60  --  >60 >60    LIVER FUNCTION TESTS:  Recent Labs    02/03/22 0248 06/03/22 1203 12/26/22 1553 12/27/22 0242  BILITOT 1.2 0.9 0.7 0.5  AST 20 18 21 16   ALT 10 15 12 13   ALKPHOS 71 85 70 59  PROT 7.8 7.7 6.8 6.0*  ALBUMIN 3.4* 4.1 3.5 3.1*     Assessment and Plan: Right lung mass Imaging reviewed with Dr. Annamaria Boots, who feels CT guided percutaneous biopsy can be offered. With proximity or involvement in the pleura, feel risks of PTX or hemorrhage are decreased to some extent. Full procedure discussed at length with pt. Risks and benefits of CT guided lung nodule biopsy was discussed with the patient including, but not limited to bleeding/hemorrhage, hemoptysis, respiratory failure requiring intubation, infection, pneumothorax requiring chest tube placement, or even death. At this point the point expresses significant frustration and anger. Feels he has been lied to. Very reluctant to proceed but then says he will go through with it. Offered alternative to continue workup as an outpt, including PET scan which may locate a less risky option for biopsy. He is advised this will prolong the workup process.  Will tentatively plan for tomorrow IF pt remains agreeable. Consent has not yet been signed. Pt will discuss with performing MD again tomorrow and then if agreeable, can sign consent then.  Keep NPO p MN Have held tonight's Lovenox dose.     Electronically Signed: Ascencion Dike, PA-C 12/27/2022, 11:43 AM   I spent a total of 30 minutes in face to face in clinical consultation, greater than 50% of which was counseling/coordinating care for lung mass bx

## 2022-12-27 NOTE — Consult Note (Signed)
NAME:  Richard [name], MRN:  [id number], DOB:  Apr 06, 1971, LOS: 0 ADMISSION DATE:  12/25/2022, CONSULTATION DATE:  12/26/22 REFERRING MD:  Dr. Criselda Peaches, CHIEF COMPLAINT:  Chest Discomfort, Hypoxia    History of Present Illness:  52 y/o M, smoker, initially evaluated by pulmonary in April 2021 with a right lung mass consistent with lung cancer.  He does carry a history of COPD, seizures, right lower extremity DVT and alcohol abuse.  Patient has been difficult to get into the pulmonary clinic and has had multiple no-shows and AMA from the emergency room.  Initial mass was noted on CT angio in March 2021 with a 2.6 cm superior segment mass of the right lower lobe, right upper lobe nodule of 1.2 cm.  In February 2023 the mass was hypermetabolic on PET imaging and significantly larger.  On 02/10/2022 he underwent an EBUS with transbronchial biopsy of 4R and 7 with multiple passes.  RLL brushing was suspicious for malignancy but definitive diagnosis was not obtained.  He was seen by radiation oncology and started on radiation therapy from February 22 through February 23, 2022.  However was lost to follow-up and did not follow-up with oncology, pulmonary or radiation oncology.  He returned to the ER on 1/12 with reports of chest discomfort.  He was found to be hypoxemic with saturations in the high 70s on his baseline oxygen of 3 L.  Initial ER workup notable for 5 L O2 need.  VBG notable for pH 7.38 and pCO2 38.  Viral screening negative for influenza, RSV and COVID.  CT angio of the chest was negative for PE but showed stable main pulmonary artery dilation consistent with pulmonary hypertension, slight enlargement of the posterior right upper lobe mass and satellite nodule more superiorly in the right upper lobe stable in size, mild pulmonary interstitial edema without effusions.  CT imaging concerning for progression of malignancy.  The patient was admitted per teaching service and pulmonary medicine consulted for  evaluation.  Pertinent  Medical History  CVA / ICH Seizures  ETOH Abuse  DVT - RLE 2016 Asthma  Chronic Hypoxic Respiratory Failure - 3L O2 baseline  Posterior RUL Mass HTN   Significant Hospital Events: Including procedures, antibiotic start and stop dates in addition to other pertinent events   1/12 Admit 1/13 PCCM consulted   Interim History / Subjective:   Frustrated about being ion the hospital, but understands the rationale for working up his lung lesion  Objective   Blood pressure (!) 131/91, pulse 84, temperature 98.5 F (36.9 C), temperature source Oral, resp. rate 18, height 5\' 9"  (1.753 m), weight 102.1 kg, SpO2 96 %.        Intake/Output Summary (Last 24 hours) at 12/27/2022 1451 Last data filed at 12/27/2022 0754 Gross per 24 hour  Intake --  Output 1450 ml  Net -1450 ml   Filed Weights   12/25/22 0517  Weight: 102.1 kg    Examination: General: adult male sitting up in chair HENT: MM pink/moist, poor dentition, [place] O2 Lungs: non-labored at rest, lungs bilaterally diminished Cardiovascular: s1s2 RRR, no MRG Abdomen: non-labored at rest, lungs bilaterally clear Extremities: warm/dry, LE 2+ edema Neuro: AAOx4, speech clear, MAE PSY: patient angry  Resolved Hospital Problem list     Assessment & Plan:   RUL/LL Lung Mass (on the fissure) Initially found in 02/2020, EBUS biopsy with no definitive diagnosis. Suspected initial stage IIIb, treated with empiric XRT and then lost to follow up. There was regression of  mass on CT from June, now slight enlargement consistent with recurrent or residual disease.  -have recommended repeat biopsy to evaluate the evolving RUL/RLL mass further.  His EBUS and right lower lobe brushings were nondiagnostic.  I believe that we can get a bigger core sample and hopefully achieve a diagnosis with TTNA.  IR consulted and appreciate their assistance. -He will need a PET scan as an outpatient to stage further -Follow-up with  oncology and radiation oncology to guide therapy once we are able to obtain tissue diagnosis  Emphysema / COPD  Tobacco Abuse  -Continue Dulera -DuoNeb available as needed -Needs smoking cessation   Labs   CBC: Recent Labs  Lab 12/25/22 0525 12/25/22 0709 12/26/22 1553 12/27/22 0242  WBC 8.4  --  7.7 7.5  NEUTROABS  --   --  5.7  --   HGB 17.8* 18.4* 16.6 15.5  HCT 54.4* 54.0* 50.1 46.4  MCV 97.8  --  95.6 96.1  PLT 250  --  227 214    Basic Metabolic Panel: Recent Labs  Lab 12/25/22 0525 12/25/22 0709 12/26/22 1553 12/27/22 0242  NA 138 138 136 138  K 4.2 4.6 4.1 3.9  CL 103  --  102 107  CO2 19*  --  24 21*  GLUCOSE 80  --  107* 122*  BUN 6  --  15 14  CREATININE 0.89  --  1.17 0.96  CALCIUM 9.2  --  9.0 8.7*   GFR: Estimated Creatinine Clearance: 106.1 mL/min (by C-G formula based on SCr of 0.96 mg/dL). Recent Labs  Lab 12/25/22 0525 12/26/22 1553 12/27/22 0242  WBC 8.4 7.7 7.5    Liver Function Tests: Recent Labs  Lab 12/26/22 1553 12/27/22 0242  AST 21 16  ALT 12 13  ALKPHOS 70 59  BILITOT 0.7 0.5  PROT 6.8 6.0*  ALBUMIN 3.5 3.1*   No results for input(s): "LIPASE", "AMYLASE" in the last 168 hours. No results for input(s): "AMMONIA" in the last 168 hours.  ABG    Component Value Date/Time   PHART 7.375 02/23/2015 0849   PCO2ART 53.9 (H) 02/23/2015 0849   PO2ART 102.0 (H) 02/23/2015 0849   HCO3 23.3 12/25/2022 0709   TCO2 24 12/25/2022 0709   ACIDBASEDEF 1.0 12/25/2022 0709   O2SAT 73 12/25/2022 0709      Critical care time: n/a      Levy Pupa, MD, PhD 12/27/2022, 2:51 PM [facility] [phone] or if no answer before 7:00PM call [phone] For any issues after 7:00PM please call eLink [phone]
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
## 2022-12-28 ENCOUNTER — Observation Stay (HOSPITAL_COMMUNITY): Payer: 59

## 2022-12-28 DIAGNOSIS — J9621 Acute and chronic respiratory failure with hypoxia: Secondary | ICD-10-CM | POA: Diagnosis not present

## 2022-12-28 DIAGNOSIS — F17210 Nicotine dependence, cigarettes, uncomplicated: Secondary | ICD-10-CM | POA: Diagnosis not present

## 2022-12-28 LAB — PROTIME-INR
INR: 1 (ref 0.8–1.2)
Prothrombin Time: 12.6 seconds (ref 11.4–15.2)

## 2022-12-28 MED ORDER — LIDOCAINE HCL (PF) 1 % IJ SOLN
10.0000 mL | Freq: Once | INTRAMUSCULAR | Status: AC
  Administered 2022-12-28: 10 mL
  Filled 2022-12-28: qty 10

## 2022-12-28 MED ORDER — MIDAZOLAM HCL 2 MG/2ML IJ SOLN
INTRAMUSCULAR | Status: AC | PRN
  Administered 2022-12-28 (×2): 1 mg via INTRAVENOUS

## 2022-12-28 MED ORDER — FENTANYL CITRATE (PF) 100 MCG/2ML IJ SOLN
INTRAMUSCULAR | Status: AC
  Filled 2022-12-28: qty 2

## 2022-12-28 MED ORDER — MIDAZOLAM HCL 2 MG/2ML IJ SOLN
INTRAMUSCULAR | Status: AC
  Filled 2022-12-28: qty 2

## 2022-12-28 MED ORDER — FENTANYL CITRATE (PF) 100 MCG/2ML IJ SOLN
INTRAMUSCULAR | Status: AC | PRN
  Administered 2022-12-28 (×2): 50 ug via INTRAVENOUS

## 2022-12-28 NOTE — Progress Notes (Signed)
PCCM Progress Note   No further Pulmonary needs identified as EBUS was non-diagnostic.. IR to assist in tissue sampling today 1/15 if patient is agreeable. PCCM will sign off. Thank you for the opportunity to participate in this patient's care. Please contact if we can be of further assistance.  Estevan Kersh D. Harris, NP-C [facility] Personal contact information can be found on Amion  If no contact or response made please call [phone] 12/28/2022, 8:42 AM
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
## 2022-12-28 NOTE — Progress Notes (Signed)
He really had no problems over the weekend.  He did have the MRI of the brain.  This was negative for any metastatic disease.  Looks like Interventional Radiology will do a biopsy on him.  Looks like this can be done today.  I do not know if they will do the Port-A-Cath line.  I keep trying to talk to [name] about how we have to treat this problem.  He seems to have a very short memory.  I talked him about the chemotherapy that we have to use.  I explained why do a Port-A-Cath will help him.  Again, I hope that he does have a understanding that what we are dealing with is not going to be cured for the most part.  He still needs to have the bone scan done.  He is eating okay.  We liberalized his diet.  His labs show white count 7.5.  Hemoglobin 15.5.  Platelet count 214,000.  His calcium is 8.7 with an albumin of 3.1.  He says his breathing is doing better.  He does not complain of any pain.  His blood pressure still quite high.  Today, his blood pressure was 139/101.  We really have to pin down what the histology is.  We really need to have enough tissue to do our molecular studies.  Hopefully, this will be accomplished with a transthoracic biopsy.  We will continue to follow along.  Really not much for [place] to do right now.  He really wants to go home.  I told him that it is up to the hospital doctor as to when he does go home.  He would like to have treatment at the [facility] cancer center.  He needs to be set up for a appointment there.  He was supposed to have seen Dr. Candise Che last year.  Christin Bach, MD  Psalm 34:1

## 2022-12-28 NOTE — Progress Notes (Signed)
Discharge:  Patient given written and verbal discharge instructions-verbalized understanding. Patient made aware that Prevnar vaccine is ordered for administration prior to discharge. Per patient, he has already had vaccine. Provider made aware. Peripheral IV removed without complications. Patient made aware of upcoming follow up appointments-verbalized understanding. Support person at bedside. Patient voices no questions or concerns at this time.

## 2022-12-28 NOTE — Procedures (Signed)
Vascular and Interventional Radiology Procedure Note  Patient: Yue Glasheen DOB: [birth year] Medical Record Number: [id number] Note Date/Time: 12/28/22 1:56 PM   Performing Physician: Roanna Banning, MD Assistant(s): None  Diagnosis: R lung mass  Procedure: RIGHT LUNG MASS BIOPSY  Anesthesia: Conscious Sedation Complications: None Estimated Blood Loss: Minimal Specimens: Sent for Pathology  Findings:  Successful CT-guided biopsy of R lung mass. A total of 3 samples were obtained. Hemostasis of the tract was achieved using Hemostatic Patch.  Plan: Bed rest for 1 hours.  See detailed procedure note with images in PACS. The patient tolerated the procedure well without incident or complication and was returned to Floor Bed in stable condition.    Roanna Banning, MD [facility]   Pager. [phone] Clinic. [phone]

## 2022-12-28 NOTE — Discharge Summary (Signed)
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 with history of chest pain and shortness of breath. EXAM: CHEST - 2 VIEW COMPARISON:  Chest x-ray 02/02/2022.  Chest CT 06/10/2022. FINDINGS: When compared to the prior chest radiograph the right upper lobe pulmonary mass appears slightly regressed, but there is persistent soft tissue fullness in the medial right upper lobe as well as the right suprahilar region, with surrounding interstitial prominence and architectural distortion, likely to reflect a combination of evolving postradiation changes, and potentially residual tumor (difficult to assess on today's plain film examination). Left lung is clear. No pleural effusions. No pneumothorax. No evidence of pulmonary edema. Heart size is mildly enlarged. IMPRESSION: 1. No definite radiographic evidence of acute  cardiopulmonary disease. 2. Evidence of treated right upper lobe neoplasm. 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 Residual neoplasm is not excluded. Follow-up contrast-enhanced chest CT should be considered in the near future when clinically appropriate to re-evaluate these findings. Electronically Signed   By: Vinnie Langton M.D.   On: 12/25/2022 05:46     Discharge Instructions: Discharge Instructions     Ambulatory referral to Hematology / Oncology   Complete by: As directed    Saw Dr. Jonette Eva while inpatient - please schedule follow up for likely lung cancer (biopsy results pending and will need follow up)   Call MD for:  difficulty breathing, headache or visual disturbances   Complete by: As directed    Call MD for:  redness, tenderness, or signs of infection (pain, swelling, redness, odor or green/yellow discharge around incision site)   Complete by: As directed    Call MD for:  severe uncontrolled pain   Complete by: As directed    Call MD for:  temperature >100.4   Complete by: As directed    Diet general   Complete by: As directed    Discharge instructions   Complete by: As directed    Richard [name],  While you were hospitalized, we found that there was an increase in size in the mass in your right lung. You had a biopsy performed on 1/15, and the results will take a few days to weeks to come back. You will need to see the oncologists at [facility] to determine your treatment plan. They will also be able to discuss the biopsy results with you once they return. They should be calling you to set up an appointment, but if you do not hear from them soon, their number is [phone]. You saw Dr. Marin Olp while you were here.   Additionally, you have an appointment with the pulmonologist (lung doctor), Dr. [name] on 12/30/2022 2:45 PM (Arrive by 2:30 PM)- the address is [place], [place], [place].  Please return to the emergency  room if your shortness of breath worsens, if you develop fevers, or severe, uncontrolled pain.  Increase activity slowly   Complete by: As directed    No wound care   Complete by: As directed        Discharge Instructions   None     Signed: Lyndle Herrlich, MD 12/28/2022, 4:05 PM   Pager: [phone]

## 2022-12-29 LAB — CEA: CEA: 6.3 ng/mL — ABNORMAL HIGH (ref 0.0–4.7)

## 2022-12-30 ENCOUNTER — Encounter: Payer: Self-pay | Admitting: Emergency Medicine

## 2022-12-30 ENCOUNTER — Ambulatory Visit (INDEPENDENT_AMBULATORY_CARE_PROVIDER_SITE_OTHER): Payer: No Typology Code available for payment source | Admitting: Emergency Medicine

## 2022-12-30 VITALS — BP 148/68 | HR 99 | Temp 98.2°F | Ht 69.0 in | Wt 201.2 lb

## 2022-12-30 DIAGNOSIS — J9611 Chronic respiratory failure with hypoxia: Secondary | ICD-10-CM

## 2022-12-30 DIAGNOSIS — C3431 Malignant neoplasm of lower lobe, right bronchus or lung: Secondary | ICD-10-CM | POA: Diagnosis not present

## 2022-12-30 DIAGNOSIS — J438 Other emphysema: Secondary | ICD-10-CM | POA: Diagnosis not present

## 2022-12-30 DIAGNOSIS — C3481 Malignant neoplasm of overlapping sites of right bronchus and lung: Secondary | ICD-10-CM

## 2022-12-30 NOTE — Assessment & Plan Note (Signed)
We repeated a needle biopsy of the treated but enlarging right lower lobe mass.  I do not have the final report, final stains are pending.  The preliminary path report is consistent with tumor infiltrating fibrotic hyalinized stroma with some geographic necrosis.  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  Again the final stains are pending.  I discussed with him that he still has existing disease and will need more treatment.  I will refer him to see Dr. Arbutus Ped who will then coordinate with Dr. Mitzi Hansen who managed his radiation therapy.  Patient understands a plan.

## 2022-12-30 NOTE — Progress Notes (Signed)
Subjective:    Patient ID: Richard [name], male    DOB: 06/05/1971, 52 y.o.   MRN: [id number]  HPI  ROV 12/30/2022 --follow-up visit 52 year old male with history of tobacco use, COPD/asthma, hypertension, ICH with seizure disorder, right lower extremity DVT, alcohol abuse, hypoxemia.  He had a right lower lobe mass with mediastinal adenopathy that was evaluated by EBUS by Dr Vassie Loll (02/10/22), showed atypical cells and was diagnosed ultimately as a presumed stage IIIb non-small cell lung cancer.  He underwent radiation therapy to the right lower lobe mass 02/2022. CT chest 05/2022 showed that the mass was smaller.  Recently admitted for chest discomfort, hypoxemia. His CT-PA 12/25/2022 showed enlargement of his mediastinal lymphadenopathy, enlargement of his posterior right upper lobe mass to 7.9 x 4.1 x 4.9 cm (compared with June).  This prompted a transthoracic needle biopsy done on 12/28/2022.    Final stains are pending, prelim path on RLL core needle biopsy > poorly differentiated carcinoma with neuroendocrine features, admixed with fibrotic change and necrosis  MRI brain 12/26/2022 did not show any evidence of metastatic disease to the brain   Review of Systems As per HPi  Past Medical History:  Diagnosis Date   Asthma    Brain bleed (HCC)    DVT (deep venous thrombosis) (HCC) 02/19/2015   RLE   GERD (gastroesophageal reflux disease)    History of home oxygen therapy    Hypertension    Seizures (HCC)    last episode 03/2013   Stroke [facility])      Family History  Problem Relation Age of Onset   Cancer Father    Diabetes Mellitus II Sister    No family history lung cancer  Social History   Socioeconomic History   Marital status: Married    Spouse name: Misty Stanley   Number of children: 1   Years of education: HS   Highest education level: Not on file  Occupational History    Employer: OTHER    Comment: n/a  Tobacco Use   Smoking status: Every Day    Packs/day: 1.00    Years: 34.00     Total pack years: 34.00    Types: Cigarettes    Start date: 1987   Smokeless tobacco: Never   Tobacco comments:    1 pack smoked daily [facility]J 12/30/22  Vaping Use   Vaping Use: Never used  Substance and Sexual Activity   Alcohol use: Yes    Comment: 01/29/22- last time 6 days ago   Drug use: Not Currently    Types: Cocaine    Comment: 09/2021- last time for cocaine   Sexual activity: Yes  Other Topics Concern   Not on file  Social History Narrative   Patient lives at home alone.   Caffeine Use: 4-5 cups daily   Social Determinants of Health   Financial Resource Strain: Not on file  Food Insecurity: Food Insecurity Present (08/28/2022)   Hunger Vital Sign    Worried About Running Out of Food in the Last Year: Sometimes true    Ran Out of Food in the Last Year: Sometimes true  Transportation Needs: Unmet Transportation Needs (08/28/2022)   PRAPARE - Administrator, Civil Service (Medical): Yes    Lack of Transportation (Non-Medical): Yes  Physical Activity: Not on file  Stress: Not on file  Social Connections: Not on file  Intimate Partner Violence: Not on file    His work Holiday representative, Contractor, concrete, Aeronautical engineer, Estate manager/land agent   Allergies  Allergen Reactions   Dilaudid [Hydromorphone Hcl] Nausea Only and Other (See Comments)    Bradycardia and hyperthermia, too   Tape Itching and Dermatitis     Outpatient Medications Prior to Visit  Medication Sig Dispense Refill   albuterol (VENTOLIN HFA) 108 (90 Base) MCG/ACT inhaler Inhale 1-2 puffs into the lungs every 6 (six) hours as needed for wheezing or shortness of breath. (Patient taking differently: Inhale 2 puffs into the lungs as needed for wheezing or shortness of breath.) 8 g 3   cloNIDine (CATAPRES) 0.3 MG tablet Take 1 tablet (0.3 mg total) by mouth 2 (two) times daily. 60 tablet 0   levETIRAcetam (KEPPRA) 500 MG tablet Take 500 mg by mouth 2 (two) times daily.     losartan (COZAAR) 25 MG tablet Take 1  tablet (25 mg total) by mouth daily. (Patient taking differently: Take 100 mg by mouth daily.) 30 tablet 0   OXYGEN Inhale 2 L/min into the lungs as needed (for shortness of breath).     [name] 160-4.5 MCG/ACT inhaler Inhale 2 puffs into the lungs daily. (Patient not taking: Reported on 12/30/2022)     No facility-administered medications prior to visit.         Objective:   Physical Exam Vitals:   12/30/22 1446  BP: (!) 148/68  Pulse: 99  Temp: 98.2 F (36.8 C)  TempSrc: Oral  SpO2: 90%  Weight: 201 lb 3.2 oz (91.3 kg)  Height: 5\' 9"  (1.753 m)    Gen: Pleasant, well-nourished, in no distress,  normal affect  ENT: No lesions,  mouth clear,  oropharynx clear, no postnasal drip  Neck: No JVD, no stridor  Lungs: No use of accessory muscles, bilateral inspiratory and expiratory rhonchi, expiratory wheeze on forced expiration, decreased in his right base  Cardiovascular: RRR, heart sounds normal, no murmur or gallops, no peripheral edema  Musculoskeletal: No deformities, no cyanosis or clubbing  Neuro: alert, awake, non focal  Skin: Warm, no lesions or rash      Assessment & Plan:   Malignant neoplasm of overlapping sites of right lung (HCC) We repeated a needle biopsy of the treated but enlarging right lower lobe mass.  I do not have the final report, final stains are pending.  The preliminary path report is consistent with tumor infiltrating fibrotic hyalinized stroma with some geographic necrosis.  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  Again the final stains are pending.  I discussed with him that he still has existing disease and will need more treatment.  I will refer him to see Dr. Julien Nordmann who will then coordinate with Dr. Lisbeth Renshaw who managed his radiation therapy.  Patient understands a plan.  COPD (chronic obstructive pulmonary disease) (HCC) Using albuterol as needed.  We will consider  starting scheduled BD going forward.  Chronic respiratory failure with hypoxia (HCC) We will arrange for a portable oxygen concentrator, use 3 L/min   Baltazar Apo, MD, PhD 12/30/2022, 3:25 PM [facility] [phone] or if no answer before 7:00PM call [phone] For any issues after 7:00PM please call eLink [phone]

## 2022-12-30 NOTE — Patient Instructions (Addendum)
We reviewed your preliminary pathology results from your lung biopsy done 12/28/2022. We will refer you to see Dr. Arbutus Ped at the [facility].  He will coordinate with Dr. Mitzi Hansen and decide next steps in your treatment plan. We will arrange for a portable oxygen concentrator, use 3 L/min Keep your albuterol available use 2 puffs when you needed for shortness of breath, chest tightness, wheezing. Follow Dr. Delton Coombes in 6 months or sooner if you have any problems.

## 2022-12-30 NOTE — Assessment & Plan Note (Signed)
Using albuterol as needed.  We will consider starting scheduled BD going forward.

## 2022-12-30 NOTE — Assessment & Plan Note (Signed)
We will arrange for a portable oxygen concentrator, use 3 L/min

## 2022-12-31 LAB — SURGICAL PATHOLOGY

## 2023-01-04 ENCOUNTER — Telehealth: Payer: Self-pay | Admitting: Emergency Medicine

## 2023-01-04 NOTE — Telephone Encounter (Signed)
PT calling on Status of Portable O2.    [phone] is his contact #   States no one has called him back. Pls. Call to advise.

## 2023-01-05 NOTE — Telephone Encounter (Signed)
PT calling again on Status of Portable O2.      [phone] is his contact #    States no one has called him back. Pls. Call to advise.

## 2023-01-06 ENCOUNTER — Other Ambulatory Visit: Payer: Self-pay | Admitting: Medical Oncology

## 2023-01-06 DIAGNOSIS — C3431 Malignant neoplasm of lower lobe, right bronchus or lung: Secondary | ICD-10-CM

## 2023-01-06 NOTE — Telephone Encounter (Signed)
Read last not to PT when he called in again. Pt expressed understanding. Gave him [employer]'s #.

## 2023-01-06 NOTE — Telephone Encounter (Signed)
Called and spoke with [employer]. The rep stated that they have received the order has been trying to get in contact with the patient. I advised her that I would call the patient to let him know.   Called patient but he did not answer. Left message for him to call back.

## 2023-01-07 ENCOUNTER — Other Ambulatory Visit: Payer: Self-pay

## 2023-01-07 ENCOUNTER — Inpatient Hospital Stay: Payer: 59

## 2023-01-07 ENCOUNTER — Inpatient Hospital Stay: Payer: 59 | Attending: Internal Medicine | Admitting: Internal Medicine

## 2023-01-07 VITALS — BP 168/78 | HR 99 | Temp 98.1°F | Resp 19 | Ht 69.0 in | Wt 199.8 lb

## 2023-01-07 DIAGNOSIS — Z720 Tobacco use: Secondary | ICD-10-CM | POA: Diagnosis not present

## 2023-01-07 DIAGNOSIS — I10 Essential (primary) hypertension: Secondary | ICD-10-CM | POA: Diagnosis not present

## 2023-01-07 DIAGNOSIS — J9621 Acute and chronic respiratory failure with hypoxia: Secondary | ICD-10-CM | POA: Diagnosis not present

## 2023-01-07 DIAGNOSIS — C3431 Malignant neoplasm of lower lobe, right bronchus or lung: Secondary | ICD-10-CM

## 2023-01-07 DIAGNOSIS — R0602 Shortness of breath: Secondary | ICD-10-CM | POA: Diagnosis not present

## 2023-01-07 LAB — CMP (CANCER CENTER ONLY)
ALT: 13 U/L (ref 0–44)
AST: 14 U/L — ABNORMAL LOW (ref 15–41)
Albumin: 4 g/dL (ref 3.5–5.0)
Alkaline Phosphatase: 73 U/L (ref 38–126)
Anion gap: 10 (ref 5–15)
BUN: 14 mg/dL (ref 6–20)
CO2: 22 mmol/L (ref 22–32)
Calcium: 9.6 mg/dL (ref 8.9–10.3)
Chloride: 109 mmol/L (ref 98–111)
Creatinine: 0.97 mg/dL (ref 0.61–1.24)
GFR, Estimated: >60 mL/min (ref >60)
Glucose, Bld: 120 mg/dL — ABNORMAL HIGH (ref 70–99)
Potassium: 3.7 mmol/L (ref 3.5–5.1)
Sodium: 141 mmol/L (ref 135–145)
Total Bilirubin: 0.4 mg/dL (ref 0.3–1.2)
Total Protein: 7.8 g/dL (ref 6.5–8.1)

## 2023-01-07 LAB — CBC WITH DIFFERENTIAL (CANCER CENTER ONLY)
Abs Immature Granulocytes: 0.03 10*3/uL (ref 0.00–0.07)
Basophils Absolute: 0 10*3/uL (ref 0.0–0.1)
Basophils Relative: 0 %
Eosinophils Absolute: 0 10*3/uL (ref 0.0–0.5)
Eosinophils Relative: 0 %
HCT: 48.5 % (ref 39.0–52.0)
Hemoglobin: 16.6 g/dL (ref 13.0–17.0)
Immature Granulocytes: 0 %
Lymphocytes Relative: 3 %
Lymphs Abs: 0.3 10*3/uL — ABNORMAL LOW (ref 0.7–4.0)
MCH: 32.4 pg (ref 26.0–34.0)
MCHC: 34.2 g/dL (ref 30.0–36.0)
MCV: 94.5 fL (ref 80.0–100.0)
Monocytes Absolute: 0.6 10*3/uL (ref 0.1–1.0)
Monocytes Relative: 5 %
Neutro Abs: 9.9 10*3/uL — ABNORMAL HIGH (ref 1.7–7.7)
Neutrophils Relative %: 92 %
Platelet Count: 264 10*3/uL (ref 150–400)
RBC: 5.13 MIL/uL (ref 4.22–5.81)
RDW: 14.7 % (ref 11.5–15.5)
WBC Count: 10.9 10*3/uL — ABNORMAL HIGH (ref 4.0–10.5)
nRBC: 0 % (ref 0.0–0.2)

## 2023-01-07 MED ORDER — PROCHLORPERAZINE MALEATE 10 MG PO TABS: 10.0000 mg | ORAL_TABLET | Freq: Four times a day (QID) | ORAL | 0 refills | Status: DC | PRN

## 2023-01-07 NOTE — Progress Notes (Signed)
[place] [phone]   [phone]  CONSULT NOTE  REFERRING PHYSICIAN: Dr. Baltazar Apo  REASON FOR CONSULTATION:  52 years old African-American male recently diagnosed with lung cancer.  HPI Thedford Kutch is a 52 y.o. male with past medical history significant for asthma, intracranial bleed in March 2016, right lower extremity deep venous thrombosis, GERD, hypertension, seizure last episode was in April 2014 as well as history of a stroke.  The patient also has a long history for smoking.  On March 04, 2020 the patient presented to the emergency department complaining of acute respiratory failure.  He had CT angiogram of the chest performed at that time and that showed 2.6 cm mass in the superior segment of the right lower lobe and 1.2 cm spiculated nodule in the right upper lobe both areas are concerning for cancer.  He was seen by Dr. Loanne Drilling with [facility] and a PET scan was performed on Apr 15, 2020 and that showed the spiculated lateral right upper lobe nodule measuring 1.1 x 1.2 cm with SUV max of 2.6.  A 2.2 x 2.6 cm nodule stranding in the right major fissure and had SUV of 11.5.  The mediastinal and axillary lymph nodes did not show hypermetabolism and no hypermetabolic hilar lymph nodes.  There was no evidence for distant metastatic disease.  The patient was lost to follow-up and on October 12, 2021 he presented to [facility] complaining of chest, back and abdominal pain.  He had CT angiogram of the chest, abdomen and pelvis performed at that time and that showed 7 x 5 cm central/perihilar right lung malignancy involving both the right upper and right lower lobes with some extension into the mediastinum.  There was 2.5 cm satellite mass in the right upper lobe and mildly enlarged right hilar, right paratracheal and subcarinal lymph nodes likely representing lymphangitic spread.  MRI of the brain without contrast at that time  showed no acute intracranial infarct or other abnormalities.  The patient was again lost to follow-up and on January 24, 2022 he presented, to the hospital complaining of right chest pain with shortness of breath.  CT angiogram of the chest was performed and it showed no evidence for pulmonary embolus but there was a 10.6 cm right lung cancer with mediastinal invasion and 3.0 cm satellite mass in the right upper lobe indicative of progressive tumor from the prior exam.  Another PET scan was performed on February 04, 2022 and that showed marked progression in the right upper lobe lung cancer when compared with the Apr 15, 2020 scan.  There was ipsilateral mediastinal/hilar and subcarinal hypermetabolic lymph nodes and no evidence of distant metastatic disease.  There was narrowing/obstruction of the right upper lobe bronchus and bronchus intermedius with associated volume loss in the right middle and right lower lobes with a small right pleural effusion.  He finally had bronchoscopy with EBUS under the care of Dr. Elsworth Soho on February 10, 2022 and the final pathology ([id number] ) was suspicious for malignancy.  The patient was seen by Dr. Irene Limbo at that time but he again was lost to follow-up.  In the interval he was seen by radiation oncology, Dr. Lisbeth Renshaw and received 10 fraction of radiotherapy to the large right lung mass completed on February 23, 2022.  Repeat CT scan of the chest on 06/10/2022 showed marked reduction in the size of the right retrohilar mass spanning across the major fissure and posterior aspect and  minor fissure currently 74 cc and previously 390 cc on February 02, 2022 with reduced size of the satellite right upper lobe nodule and reduce atelectasis in the right lower lobe.  The patient also had reduced size of a small subpleural right upper lobe nodule and there are 2 nodules that are below 5 mm in diameter in the right upper lobe which are new and could be inflammatory or neoplastic.  The patient was  referred to Dr. Marin Olp but he also did not keep his appointment with him.  He was admitted to the hospital on December 25, 2022 and CT angiogram of the chest performed at that time showed slight enlargement of the posterior right upper lobe mass abutting the posterior hilum and posterior mediastinum the satellite nodule more superiorly in the right upper lobe appears stable in size and measuring approximately 1.0 cm there was also interval enlargement of AP window and right lower paratracheal lymph node since the prior study.  This finding as well as the slight enlargement of the right perihilar upper lobe mass is suggestive of progression of malignancy.  The patient had MRI of the brain on December 26, 2022 and that showed no evidence of metastatic disease to the brain.  On December 28, 2022 he underwent CT-guided core biopsy of the right lower lobe lung mass by interventional radiology.  The final pathology ([id number] ) showed poorly differentiated carcinoma with neuroendocrine features. Four immunohistochemical stains performed with adequate control to elucidate the histogenesis of this tumor.  The tumor is diffusely and strongly positive for TTF-1.  The tumor is positive for the neuroendocrine markers synaptophysin and shows a focal positivity for the pulmonary adeno marker Napsin A. Interestingly the focus of the tumor that shows Napsin positivity the synaptophysin ficin staining is weaker.  This focus comprises approximately a third of the stained core. Given the cytohistomorphology and this immunohistochemical pattern the differential diagnosis would include a large cell neuroendocrine carcinoma with weak apparent Napsin staining (favored) and a poorly differentiated adenocarcinoma with extensive neuroendocrine features.  The patient was referred to me today for evaluation and recommendation regarding treatment of his condition. When seen today he continues to complain of cough productive of yellowish  sputum but no hemoptysis.  He has no chest pain but has shortness of breath at baseline and currently on home oxygen.  He has no nausea, vomiting, diarrhea or constipation.  He has no headache or visual changes.  He denied having any recent weight loss or night sweats.  He has no fever or chills. Family history significant for mother with cancer.  Father unknown. The patient is married and has 1 son and 3 stepchildren.  He has been on disability for several years.  He has a history of smoking 1 pack/day for around 36 years and unfortunately he continues to smoke and I strongly encouraged him to quit smoking.b The patient declined having any history of alcohol or drug abuse.  HPI  Past Medical History:  Diagnosis Date   Asthma    Brain bleed ([place])    DVT (deep venous thrombosis) ([place]) 02/19/2015   RLE   GERD (gastroesophageal reflux disease)    History of home oxygen therapy    Hypertension    Seizures ([place])    last episode 03/2013   Stroke [facility])     Past Surgical History:  Procedure Laterality Date   BRONCHIAL BIOPSY  02/10/2022   Procedure: BRONCHIAL BIOPSIES;  Surgeon: Rigoberto Noel, MD;  Location: [facility];  Service: Cardiopulmonary;;   BRONCHIAL BRUSHINGS  02/10/2022   Procedure: BRONCHIAL BRUSHINGS;  Surgeon: Rigoberto Noel, MD;  Location: [facility];  Service: Cardiopulmonary;;   BRONCHIAL NEEDLE ASPIRATION BIOPSY  02/10/2022   Procedure: BRONCHIAL NEEDLE ASPIRATION BIOPSIES;  Surgeon: Rigoberto Noel, MD;  Location: [facility];  Service: Cardiopulmonary;;   BRONCHIAL WASHINGS  02/10/2022   Procedure: BRONCHIAL WASHINGS;  Surgeon: Rigoberto Noel, MD;  Location: [facility];  Service: Cardiopulmonary;;   ENDOBRONCHIAL ULTRASOUND Bilateral 02/10/2022   Procedure: ENDOBRONCHIAL ULTRASOUND;  Surgeon: Rigoberto Noel, MD;  Location: [facility];  Service: Cardiopulmonary;  Laterality: Bilateral;   LEG SURGERY     VIDEO BRONCHOSCOPY  02/10/2022   Procedure: VIDEO BRONCHOSCOPY  WITHOUT FLUORO;  Surgeon: Rigoberto Noel, MD;  Location: [facility];  Service: Cardiopulmonary;;    Family History  Problem Relation Age of Onset   Cancer Father    Diabetes Mellitus II Sister     Social History Social History   Tobacco Use   Smoking status: Every Day    Packs/day: 1.00    Years: 34.00    Total pack years: 34.00    Types: Cigarettes    Start date: 1987   Smokeless tobacco: Never   Tobacco comments:    1 pack smoked daily ARJ 12/30/22  Vaping Use   Vaping Use: Never used  Substance Use Topics   Alcohol use: Yes    Comment: 01/29/22- last time 6 days ago   Drug use: Not Currently    Types: Cocaine    Comment: 09/2021- last time for cocaine    Allergies  Allergen Reactions   Dilaudid [Hydromorphone Hcl] Nausea Only and Other (See Comments)    Bradycardia and hyperthermia, too   Tape Itching and Dermatitis    Current Outpatient Medications  Medication Sig Dispense Refill   albuterol (VENTOLIN HFA) 108 (90 Base) MCG/ACT inhaler Inhale 1-2 puffs into the lungs every 6 (six) hours as needed for wheezing or shortness of breath. (Patient taking differently: Inhale 2 puffs into the lungs as needed for wheezing or shortness of breath.) 8 g 3   [name] 160-4.5 MCG/ACT inhaler Inhale 2 puffs into the lungs daily. (Patient not taking: Reported on 12/30/2022)     cloNIDine (CATAPRES) 0.3 MG tablet Take 1 tablet (0.3 mg total) by mouth 2 (two) times daily. 60 tablet 0   levETIRAcetam (KEPPRA) 500 MG tablet Take 500 mg by mouth 2 (two) times daily.     losartan (COZAAR) 25 MG tablet Take 1 tablet (25 mg total) by mouth daily. (Patient taking differently: Take 100 mg by mouth daily.) 30 tablet 0   OXYGEN Inhale 2 L/min into the lungs as needed (for shortness of breath).     No current facility-administered medications for this visit.    Review of Systems  Constitutional: positive for fatigue Eyes: negative Ears, nose, mouth, throat, and face: negative Respiratory:  positive for cough and dyspnea on exertion Cardiovascular: negative Gastrointestinal: negative Genitourinary:negative Integument/breast: negative Hematologic/lymphatic: negative Musculoskeletal:negative Neurological: negative Behavioral/Psych: negative Endocrine: negative Allergic/Immunologic: negative  Physical Exam  ZOX:WRUEA, healthy, no distress, well nourished, and well developed SKIN: skin color, texture, turgor are normal, no rashes or significant lesions HEAD: Normocephalic, No masses, lesions, tenderness or abnormalities EYES: normal, PERRLA, Conjunctiva are pink and non-injected EARS: External ears normal, Canals clear OROPHARYNX:no exudate, no erythema, and lips, buccal mucosa, and tongue normal  NECK: supple, no adenopathy, no JVD LYMPH:  no palpable lymphadenopathy, no hepatosplenomegaly LUNGS: clear to auscultation ,  and palpation HEART: regular rate & rhythm, no murmurs, and no gallops ABDOMEN:abdomen soft, non-tender, normal bowel sounds, and no masses or organomegaly BACK: Back symmetric, no curvature., No CVA tenderness EXTREMITIES:no joint deformities, effusion, or inflammation, no edema  NEURO: alert & oriented x 3 with fluent speech, no focal motor/sensory deficits  PERFORMANCE STATUS: ECOG 1  LABORATORY DATA: Lab Results  Component Value Date   WBC 10.9 (H) 01/07/2023   HGB 16.6 01/07/2023   HCT 48.5 01/07/2023   MCV 94.5 01/07/2023   PLT 264 01/07/2023      Chemistry      Component Value Date/Time   NA 138 12/27/2022 0242   K 3.9 12/27/2022 0242   CL 107 12/27/2022 0242   CO2 21 (L) 12/27/2022 0242   BUN 14 12/27/2022 0242   CREATININE 0.96 12/27/2022 0242   CREATININE 0.91 12/05/2013 1605      Component Value Date/Time   CALCIUM 8.7 (L) 12/27/2022 0242   ALKPHOS 59 12/27/2022 0242   AST 16 12/27/2022 0242   ALT 13 12/27/2022 0242   BILITOT 0.5 12/27/2022 0242       RADIOGRAPHIC STUDIES: CT LUNG MASS BIOPSY  Result Date:  12/28/2022 INDICATION: Lung mass EXAM: CT-GUIDED RIGHT LUNG MASS BIOPSY COMPARISON:  CTA PE and chest XR, 12/25/2022. MEDICATIONS: None. ANESTHESIA/SEDATION: Moderate (conscious) sedation was employed during this procedure. A total of Versed 2 mg and Fentanyl 100 mcg was administered intravenously. Moderate Sedation Time: 25 minutes. The patient's level of consciousness and vital signs were monitored continuously by radiology nursing throughout the procedure under my direct supervision. CONTRAST:  None COMPLICATIONS: None immediate. PROCEDURE: RADIATION DOSE REDUCTION: This exam was performed according to the departmental dose-optimization program which includes automated exposure control, adjustment of the mA and/or kV according to patient size and/or use of iterative reconstruction technique. Informed consent was obtained from the patient following an explanation of the procedure, risks, benefits and alternatives. The patient understands,agrees and consents for the procedure. All questions were addressed. A time out was performed prior to the initiation of the procedure. The patient was positioned prone on the CT table and a limited chest CT was performed for procedural planning demonstrating RIGHT posterior lung mass. The operative site was prepped and draped in the usual sterile fashion. Under sterile conditions and local anesthesia, a 17 gauge coaxial needle was advanced into the peripheral aspect of the nodule. Positioning was confirmed with intermittent CT fluoroscopy and followed by the acquisition of 3 course with an 18 gauge core needle biopsy device. The coaxial needle was removed following hemostatic patch and superficial hemostasis was achieved with manual compression. Limited post procedural chest CT was negative for pneumothorax or additional complication. A dressing was placed. The patient tolerated the procedure well without immediate postprocedural complication. The patient was escorted to have an  upright chest radiograph. IMPRESSION: Successful CT guided core needle core biopsy of RIGHT lung mass. Michaelle Birks, MD [facility] Radiology Electronically Signed   By: Michaelle Birks M.D.   On: 12/28/2022 16:10   DG Chest Port 1 View  Result Date: 12/28/2022 CLINICAL DATA:  Post biopsy EXAM: PORTABLE CHEST 1 VIEW COMPARISON:  CT 12/25/2022, chest x-ray 12/25/2022, CT 06/10/2022 FINDINGS: Right hilar mass and distortion. No pleural effusion or visible pneumothorax. Stable cardiac size. IMPRESSION: Negative for pneumothorax or pleural effusion. Right hilar mass and distortion, reference chest CT 12/25/2022. Electronically Signed   By: Donavan Foil M.D.   On: 12/28/2022 15:52  MR BRAIN W WO CONTRAST  Result Date: 12/26/2022 CLINICAL DATA:  Lung cancer staging EXAM: MRI HEAD WITHOUT AND WITH CONTRAST TECHNIQUE: Multiplanar, multiecho pulse sequences of the brain and surrounding structures were obtained without and with intravenous contrast. CONTRAST:  75mL GADAVIST GADOBUTROL 1 MMOL/ML IV SOLN COMPARISON:  02/03/2022 FINDINGS: Brain: No enhancement or swelling to suggest metastatic disease. Extensive chronic small vessel ischemia in the cerebral white matter, confluent around the lateral ventricles. Unchanged large area of encephalomalacia in the anterior left frontal lobe. Stable brain volume. No acute infarct, hydrocephalus, or collection. Vascular: Major flow voids and vascular enhancements are preserved Skull and upper cervical spine: Curved no focal marrow lesion. Sinuses/Orbits: Chronic left maxillary sinusitis with wall thickening and complete opacification. Negative orbits. IMPRESSION: Stable compared to February 2023. Negative for metastatic disease to the brain. Electronically Signed   By: Jorje Guild M.D.   On: 12/26/2022 17:19   VAS [place] LOWER EXTREMITY VENOUS (DVT)  Result Date: 12/26/2022  Lower Venous DVT Study Patient Name:  QUIRINO KAKOS   Date of Exam:   12/26/2022 Medical Rec #: [id number]     Accession #:    [id number] Date of Birth: 08-25-1971     Patient Gender: M Patient Age:   58 years Exam Location:  [facility] Procedure:      VAS [place] LOWER EXTREMITY VENOUS (DVT) Referring Phys: JULIE MACHEN --------------------------------------------------------------------------------  Indications: Swelling, and Pain.  Risk Factors: Cancer of the lung diagnosed 2021 no follow up. Comparison Study: Prior negative bilateral LEV done 02/03/22 Performing Technologist: Sharion Dove RVS  Examination Guidelines: A complete evaluation includes B-mode imaging, spectral Doppler, color Doppler, and power Doppler as needed of all accessible portions of each vessel. Bilateral testing is considered an integral part of a complete examination. Limited examinations for reoccurring indications may be performed as noted. The reflux portion of the exam is performed with the patient in reverse Trendelenburg.  +-----+---------------+---------+-----------+----------+--------------+ RIGHTCompressibilityPhasicitySpontaneityPropertiesThrombus Aging +-----+---------------+---------+-----------+----------+--------------+ CFV  Full           Yes      Yes                                 +-----+---------------+---------+-----------+----------+--------------+   +---------+---------------+---------+-----------+----------+--------------+ LEFT     CompressibilityPhasicitySpontaneityPropertiesThrombus Aging +---------+---------------+---------+-----------+----------+--------------+ CFV      Full           Yes      Yes                                 +---------+---------------+---------+-----------+----------+--------------+ SFJ      Full                                                        +---------+---------------+---------+-----------+----------+--------------+ FV Prox  Full                                                         +---------+---------------+---------+-----------+----------+--------------+ FV Mid   Full                                                        +---------+---------------+---------+-----------+----------+--------------+  FV DistalFull                                                        +---------+---------------+---------+-----------+----------+--------------+ PFV      Full                                                        +---------+---------------+---------+-----------+----------+--------------+ POP      Full           Yes      Yes                                 +---------+---------------+---------+-----------+----------+--------------+ PTV      Full                                                        +---------+---------------+---------+-----------+----------+--------------+ PERO     Full                                                        +---------+---------------+---------+-----------+----------+--------------+     Summary: RIGHT: - No evidence of common femoral vein obstruction.  LEFT: - There is no evidence of deep vein thrombosis in the lower extremity.  - No cystic structure found in the popliteal fossa.  *See table(s) above for measurements and observations. Electronically signed by Monica Martinez MD on 12/26/2022 at 11:45:54 AM.    Final    ECHOCARDIOGRAM COMPLETE  Result Date: 12/26/2022    ECHOCARDIOGRAM REPORT   Patient Name:   Myquan Schaumburg Date of Exam: 12/26/2022 Medical Rec #:  [id number]    Height:       69.0 in Accession #:    [id number]   Weight:       225.0 lb Date of Birth:  1971-07-11    BSA:          2.172 m Patient Age:    20 years     BP:           114/76 mmHg Patient Gender: M            HR:           92 bpm. Exam Location:  Inpatient Procedure: 2D Echo, Cardiac Doppler and Color Doppler Indications:    Dyspnea  History:        Patient has prior history of Echocardiogram examinations, most                 recent 02/04/2022. COPD;  Risk Factors:Hypertension and Current                 Smoker. Lung cancer. Hx TIA.  Sonographer:    Clayton Lefort RDCS (AE) Referring Phys: [place]  1. Left ventricular ejection fraction, by estimation, is 60  to 65%. The left ventricle has normal function. The left ventricle has no regional wall motion abnormalities. There is moderate concentric left ventricular hypertrophy. Left ventricular diastolic parameters are consistent with Grade I diastolic dysfunction (impaired relaxation).  2. Right ventricular systolic function is normal. The right ventricular size is normal.  3. The mitral valve is normal in structure. No evidence of mitral valve regurgitation. No evidence of mitral stenosis.  4. The aortic valve has an indeterminant number of cusps. Aortic valve regurgitation is mild. No aortic stenosis is present.  5. The inferior vena cava is normal in size with greater than 50% respiratory variability, suggesting right atrial pressure of 3 mmHg. Comparison(s): No significant change from prior study. FINDINGS  Left Ventricle: Left ventricular ejection fraction, by estimation, is 60 to 65%. The left ventricle has normal function. The left ventricle has no regional wall motion abnormalities. The left ventricular internal cavity size was normal in size. There is  moderate concentric left ventricular hypertrophy. Left ventricular diastolic parameters are consistent with Grade I diastolic dysfunction (impaired relaxation). Right Ventricle: The right ventricular size is normal. Right ventricular systolic function is normal. Left Atrium: Left atrial size was normal in size. Right Atrium: Right atrial size was normal in size. Pericardium: Trivial pericardial effusion is present. Mitral Valve: The mitral valve is normal in structure. No evidence of mitral valve regurgitation. No evidence of mitral valve stenosis. Tricuspid Valve: The tricuspid valve is normal in structure. Tricuspid valve regurgitation is  trivial. No evidence of tricuspid stenosis. Aortic Valve: The aortic valve has an indeterminant number of cusps. Aortic valve regurgitation is mild. Aortic regurgitation PHT measures 409 msec. No aortic stenosis is present. Aortic valve mean gradient measures 2.0 mmHg. Aortic valve peak gradient measures 3.8 mmHg. Aortic valve area, by VTI measures 4.38 cm. Pulmonic Valve: The pulmonic valve was normal in structure. Pulmonic valve regurgitation is not visualized. No evidence of pulmonic stenosis. Aorta: The aortic root is normal in size and structure. Venous: The inferior vena cava is normal in size with greater than 50% respiratory variability, suggesting right atrial pressure of 3 mmHg. IAS/Shunts: No atrial level shunt detected by color flow Doppler.  LEFT VENTRICLE PLAX 2D LVIDd:         4.30 cm   Diastology LVIDs:         3.00 cm   LV e' medial:    4.79 cm/s LV PW:         2.00 cm   LV E/e' medial:  13.5 LV IVS:        1.40 cm   LV e' lateral:   7.72 cm/s LVOT diam:     2.30 cm   LV E/e' lateral: 8.4 LV SV:         71 LV SV Index:   33 LVOT Area:     4.15 cm  RIGHT VENTRICLE             IVC RV Basal diam:  3.30 cm     IVC diam: 1.50 cm RV S prime:     16.50 cm/s TAPSE (M-mode): 1.9 cm LEFT ATRIUM             Index        RIGHT ATRIUM           Index LA diam:        2.80 cm 1.29 cm/m   RA Area:     13.40 cm LA Vol (A2C):   35.6 ml 16.39 ml/m  RA Volume:   32.70 ml  15.05 ml/m LA Vol (A4C):   28.3 ml 13.03 ml/m LA Biplane Vol: 32.2 ml 14.82 ml/m  AORTIC VALVE AV Area (Vmax):    4.26 cm AV Area (Vmean):   4.31 cm AV Area (VTI):     4.38 cm AV Vmax:           97.00 cm/s AV Vmean:          66.700 cm/s AV VTI:            0.163 m AV Peak Grad:      3.8 mmHg AV Mean Grad:      2.0 mmHg LVOT Vmax:         99.50 cm/s LVOT Vmean:        69.200 cm/s LVOT VTI:          0.172 m LVOT/AV VTI ratio: 1.06 AI PHT:            409 msec  AORTA Ao Root diam: 3.90 cm MITRAL VALVE MV Area (PHT): 3.21 cm    SHUNTS MV Decel  Time: 236 msec    Systemic VTI:  0.17 m MV E velocity: 64.60 cm/s  Systemic Diam: 2.30 cm MV A velocity: 98.50 cm/s MV E/A ratio:  0.66 Kirk Ruths MD Electronically signed by Kirk Ruths MD Signature Date/Time: 12/26/2022/11:34:31 AM    Final    CT Angio Chest PE W and/or Wo Contrast  Result Date: 12/25/2022 CLINICAL DATA:  Shortness of breath and hypoxia. History of lung carcinoma. EXAM: CT ANGIOGRAPHY CHEST WITH CONTRAST TECHNIQUE: Multidetector CT imaging of the chest was performed using the standard protocol during bolus administration of intravenous contrast. Multiplanar CT image reconstructions and MIPs were obtained to evaluate the vascular anatomy. RADIATION DOSE REDUCTION: This exam was performed according to the departmental dose-optimization program which includes automated exposure control, adjustment of the mA and/or kV according to patient size and/or use of iterative reconstruction technique. CONTRAST:  32mL OMNIPAQUE IOHEXOL 350 MG/ML SOLN COMPARISON:  CT of the chest on 06/10/2022 and prior CTA on 02/02/2022 FINDINGS: Cardiovascular: Pulmonary arteries are adequately opacified. There is no evidence of pulmonary embolism. Stable main pulmonary artery dilatation measuring up to 3.6 cm. Stable dilated ascending thoracic aorta measuring up to 4.3 cm. No evidence of aortic dissection. Normal heart size. No visualized calcified coronary artery plaque. No pericardial fluid identified. Mediastinum/Nodes: AP window node on image 49 shows enlargement measuring 11 mm in short axis compared to 8 mm previously. Right lower paratracheal lymph node demonstrates interval enlargement measuring approximately 12 mm in short axis compared to 10 mm previously. Subcarinal lymph node difficult to measure as it abuts tumor but likely stable in size measuring roughly 15 mm. There are multiple other scattered smaller mediastinal nodes. Lungs/Pleura: Posterior right upper lobe mass abutting the posterior hilum and  posterior mediastinum may be slightly increased in size since the prior study with current measured dimensions of approximately 7.9 x 4.1 x 4.9 cm (previously 7.2 x 4.1 x 4.8 cm). The satellite nodule more superiorly in the right upper lobe appears stable in size measuring approximately 1 cm. No further definite visualized smaller nodules in the right upper lobe. Lungs demonstrate generalized septal thickening since the prior study with scattered areas of ground-glass opacity and associated pulmonary venous distension. Findings are suggestive of mild pulmonary interstitial edema. No associated pleural fluid, focal airspace consolidation or pneumothorax. Upper Abdomen: Stable low-density left adrenal adenoma measuring up to roughly 2.7 cm. This  has previously been demonstrated to have long-term stability and does not require follow-up. Musculoskeletal: No bony lesions or fractures identified. Review of the MIP images confirms the above findings. IMPRESSION: 1. No evidence of pulmonary embolism. 2. Stable main pulmonary artery dilatation consistent with pulmonary hypertension. 3. Stable dilated ascending thoracic aorta measuring 4.3 cm. Recommend annual imaging followup by CTA or MRA. This recommendation follows 2010 ACCF/AHA/AATS/ACR/ASA/SCA/SCAI/SIR/STS/SVM Guidelines for the Diagnosis and Management of Patients with Thoracic Aortic Disease. Circulation. 2010; 121: W620-B559. Aortic aneurysm NOS (ICD10-I71.9) 4. Mild pulmonary interstitial edema without pleural effusions. 5. Slight enlargement of the posterior right upper lobe mass abutting the posterior hilum and posterior mediastinum. The satellite nodule more superiorly in the right upper lobe appears stable in size measuring approximately 1 cm. No further definite visualized smaller nodules in the right upper lobe. 6. Interval enlargement of AP window and right lower paratracheal lymph nodes since the prior study. This finding as well as slight enlargement of  the right perihilar upper lobe mass is suggestive of progression of malignancy. Recommend follow-up with Oncology and consideration of additional follow-up PET imaging. 7. Stable low-density left adrenal adenoma. This has previously been demonstrated to have long-term stability and does not require follow-up. Electronically Signed   By: Aletta Edouard M.D.   On: 12/25/2022 08:29   DG Chest 2 View  Result Date: 12/25/2022 CLINICAL DATA:  52 year old male with history of chest pain and shortness of breath. EXAM: CHEST - 2 VIEW COMPARISON:  Chest x-ray 02/02/2022.  Chest CT 06/10/2022. FINDINGS: When compared to the prior chest radiograph the right upper lobe pulmonary mass appears slightly regressed, but there is persistent soft tissue fullness in the medial right upper lobe as well as the right suprahilar region, with surrounding interstitial prominence and architectural distortion, likely to reflect a combination of evolving postradiation changes, and potentially residual tumor (difficult to assess on today's plain film examination). Left lung is clear. No pleural effusions. No pneumothorax. No evidence of pulmonary edema. Heart size is mildly enlarged. IMPRESSION: 1. No definite radiographic evidence of acute cardiopulmonary disease. 2. Evidence of treated right upper lobe neoplasm. 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 Residual neoplasm is not excluded. Follow-up contrast-enhanced chest CT should be considered in the near future when clinically appropriate to re-evaluate these findings. Electronically Signed   By: Vinnie Langton M.D.   On: 12/25/2022 05:46    ASSESSMENT: This is a very pleasant 52 years old African-American male recently diagnosed with a stage IIIb (T4, N2, M0) non-small cell lung cancer, poorly differentiated carcinoma with neuroendocrine features diagnosed in January 2024 and presented with large right lower lobe lung mass in addition to  right hilar and mediastinal lymphadenopathy.  The patient status post short course of palliative radiotherapy with 30 Pearline Cables completed in March 2023.   PLAN: I had a lengthy discussion with the patient today about his current disease stage, prognosis and treatment options. I personally and independently reviewed the scan images as well as the pathology report and discussed the result with the patient today. I explained to the patient that he has a potentially curable condition or at least potentially controllable disease. I recommended for the patient a course of concurrent chemoradiation with weekly carboplatin for AUC of 2 and paclitaxel 45 Mg/M2 for around 6 weeks depending on the dose of radiation. This will be followed by consolidation immunotherapy if the patient has no evidence for disease progression. I discussed with the patient the adverse effect  of the chemotherapy including but not limited to alopecia, myelosuppression, nausea and vomiting, peripheral neuropathy, liver or renal dysfunction. The patient is interested in the treatment and he will have a follow-up appointment with Dr. Lisbeth Renshaw for discussion of the radiotherapy option. He is expected to start the first dose of this treatment on January 18, 2023. The patient will have a chemotherapy education class before the first dose of his treatment. I will send a prescription for Compazine to his pharmacy. He will come back for follow-up visit at that time with the first cycle of his treatment. For the smoking cessation I strongly encouraged the patient to quit smoking. He was advised to call immediately if he has any other concerning symptoms in the interval.  The patient voices understanding of current disease status and treatment options and is in agreement with the current care plan.  All questions were answered. The patient knows to call the clinic with any problems, questions or concerns. We can certainly see the patient much sooner  if necessary.  Thank you so much for allowing me to participate in the care of Kelly. I will continue to follow up the patient with you and assist in his care.  The total time spent in the appointment was 90 minutes.  Disclaimer: This note was dictated with voice recognition software. Similar sounding words can inadvertently be transcribed and may not be corrected upon review.   Eilleen Kempf January 07, 2023, 1:54 PM

## 2023-01-07 NOTE — Progress Notes (Signed)
START ON PATHWAY REGIMEN - Non-Small Cell Lung     A cycle is every 7 days, concurrent with RT:     Paclitaxel      Carboplatin   **Always confirm dose/schedule in your pharmacy ordering system**  Patient Characteristics: Preoperative or Nonsurgical Candidate (Clinical Staging), Stage III - Nonsurgical Candidate (Nonsquamous and Squamous), PS = 0, 1 Therapeutic Status: Preoperative or Nonsurgical Candidate (Clinical Staging) AJCC T Category: cT4 AJCC N Category: cN2 AJCC M Category: cM0 AJCC 8 Stage Grouping: IIIB ECOG Performance Status: 1 Intent of Therapy: Curative Intent, Discussed with Patient

## 2023-01-08 ENCOUNTER — Telehealth: Payer: Self-pay | Admitting: Internal Medicine

## 2023-01-08 ENCOUNTER — Other Ambulatory Visit: Payer: Self-pay

## 2023-01-08 NOTE — Progress Notes (Signed)
The proposed treatment discussed in conference is for discussion purpose only and is not a binding recommendation.  The patients have not been physically examined, or presented with their treatment options.  Therefore, final treatment plans cannot be decided.  

## 2023-01-08 NOTE — Telephone Encounter (Signed)
Scheduled per 01/25 los and work-queue, patient has been called and voicemail was left regarding upcoming appointments.

## 2023-01-09 ENCOUNTER — Inpatient Hospital Stay (HOSPITAL_COMMUNITY)
Admission: EM | Admit: 2023-01-09 | Discharge: 2023-01-12 | DRG: 189 | Disposition: A | Discharge status: Home / Self Care | Payer: 59 | Attending: Student in an Organized Health Care Education/Training Program | Admitting: Student in an Organized Health Care Education/Training Program

## 2023-01-09 ENCOUNTER — Other Ambulatory Visit: Payer: Self-pay

## 2023-01-09 ENCOUNTER — Emergency Department (HOSPITAL_COMMUNITY): Payer: 59

## 2023-01-09 ENCOUNTER — Encounter: Payer: Self-pay | Admitting: Internal Medicine

## 2023-01-09 DIAGNOSIS — Z923 Personal history of irradiation: Secondary | ICD-10-CM

## 2023-01-09 DIAGNOSIS — Z8673 Personal history of transient ischemic attack (TIA), and cerebral infarction without residual deficits: Secondary | ICD-10-CM

## 2023-01-09 DIAGNOSIS — Z87898 Personal history of other specified conditions: Secondary | ICD-10-CM

## 2023-01-09 DIAGNOSIS — J441 Chronic obstructive pulmonary disease with (acute) exacerbation: Secondary | ICD-10-CM | POA: Diagnosis present

## 2023-01-09 DIAGNOSIS — M25562 Pain in left knee: Secondary | ICD-10-CM | POA: Insufficient documentation

## 2023-01-09 DIAGNOSIS — C3411 Malignant neoplasm of upper lobe, right bronchus or lung: Secondary | ICD-10-CM | POA: Diagnosis present

## 2023-01-09 DIAGNOSIS — Z79899 Other long term (current) drug therapy: Secondary | ICD-10-CM

## 2023-01-09 DIAGNOSIS — I5032 Chronic diastolic (congestive) heart failure: Secondary | ICD-10-CM

## 2023-01-09 DIAGNOSIS — F17210 Nicotine dependence, cigarettes, uncomplicated: Secondary | ICD-10-CM | POA: Diagnosis present

## 2023-01-09 DIAGNOSIS — G40909 Epilepsy, unspecified, not intractable, without status epilepticus: Secondary | ICD-10-CM | POA: Diagnosis present

## 2023-01-09 DIAGNOSIS — J210 Acute bronchiolitis due to respiratory syncytial virus: Secondary | ICD-10-CM

## 2023-01-09 DIAGNOSIS — J9601 Acute respiratory failure with hypoxia: Secondary | ICD-10-CM | POA: Diagnosis present

## 2023-01-09 DIAGNOSIS — J440 Chronic obstructive pulmonary disease with acute lower respiratory infection: Secondary | ICD-10-CM | POA: Diagnosis present

## 2023-01-09 DIAGNOSIS — I110 Hypertensive heart disease with heart failure: Secondary | ICD-10-CM | POA: Diagnosis present

## 2023-01-09 DIAGNOSIS — J9621 Acute and chronic respiratory failure with hypoxia: Principal | ICD-10-CM | POA: Diagnosis present

## 2023-01-09 DIAGNOSIS — Z1152 Encounter for screening for COVID-19: Secondary | ICD-10-CM

## 2023-01-09 DIAGNOSIS — Z9981 Dependence on supplemental oxygen: Secondary | ICD-10-CM

## 2023-01-09 DIAGNOSIS — I272 Pulmonary hypertension, unspecified: Secondary | ICD-10-CM | POA: Diagnosis present

## 2023-01-09 LAB — I-STAT VENOUS BLOOD GAS, ED
Acid-base deficit: 2 mmol/L (ref 0.0–2.0)
Bicarbonate: 21.9 mmol/L (ref 20.0–28.0)
Calcium, Ion: 1.02 mmol/L — ABNORMAL LOW (ref 1.15–1.40)
HCT: 45 % (ref 39.0–52.0)
Hemoglobin: 15.3 g/dL (ref 13.0–17.0)
O2 Saturation: 92 %
Potassium: 4.8 mmol/L (ref 3.5–5.1)
Sodium: 144 mmol/L (ref 135–145)
TCO2: 23 mmol/L (ref 22–32)
pCO2, Ven: 34.8 mmHg — ABNORMAL LOW (ref 44–60)
pH, Ven: 7.408 (ref 7.25–7.43)
pO2, Ven: 62 mmHg — ABNORMAL HIGH (ref 32–45)

## 2023-01-09 LAB — CBC WITH DIFFERENTIAL/PLATELET
Abs Immature Granulocytes: 0.05 10*3/uL (ref 0.00–0.07)
Basophils Absolute: 0 10*3/uL (ref 0.0–0.1)
Basophils Relative: 0 %
Eosinophils Absolute: 0 10*3/uL (ref 0.0–0.5)
Eosinophils Relative: 0 %
HCT: 48.5 % (ref 39.0–52.0)
Hemoglobin: 16.6 g/dL (ref 13.0–17.0)
Immature Granulocytes: 1 %
Lymphocytes Relative: 11 %
Lymphs Abs: 1 10*3/uL (ref 0.7–4.0)
MCH: 32.7 pg (ref 26.0–34.0)
MCHC: 34.2 g/dL (ref 30.0–36.0)
MCV: 95.5 fL (ref 80.0–100.0)
Monocytes Absolute: 0.8 10*3/uL (ref 0.1–1.0)
Monocytes Relative: 8 %
Neutro Abs: 7.8 10*3/uL — ABNORMAL HIGH (ref 1.7–7.7)
Neutrophils Relative %: 80 %
Platelets: 290 10*3/uL (ref 150–400)
RBC: 5.08 MIL/uL (ref 4.22–5.81)
RDW: 14.9 % (ref 11.5–15.5)
WBC: 9.7 10*3/uL (ref 4.0–10.5)
nRBC: 0 % (ref 0.0–0.2)

## 2023-01-09 LAB — COMPREHENSIVE METABOLIC PANEL
ALT: 15 U/L (ref 0–44)
AST: 18 U/L (ref 15–41)
Albumin: 3.6 g/dL (ref 3.5–5.0)
Alkaline Phosphatase: 61 U/L (ref 38–126)
Anion gap: 12 (ref 5–15)
BUN: 9 mg/dL (ref 6–20)
CO2: 21 mmol/L — ABNORMAL LOW (ref 22–32)
Calcium: 9.1 mg/dL (ref 8.9–10.3)
Chloride: 107 mmol/L (ref 98–111)
Creatinine, Ser: 1.04 mg/dL (ref 0.61–1.24)
GFR, Estimated: >60 mL/min (ref >60)
Glucose, Bld: 91 mg/dL (ref 70–99)
Potassium: 3.7 mmol/L (ref 3.5–5.1)
Sodium: 140 mmol/L (ref 135–145)
Total Bilirubin: 0.5 mg/dL (ref 0.3–1.2)
Total Protein: 7.2 g/dL (ref 6.5–8.1)

## 2023-01-09 LAB — TROPONIN I (HIGH SENSITIVITY)
Troponin I (High Sensitivity): 15 ng/L (ref <18)
Troponin I (High Sensitivity): 14 ng/L (ref <18)

## 2023-01-09 LAB — RESP PANEL BY RT-PCR (RSV, FLU A&B, COVID)  RVPGX2
Influenza A by PCR: NEGATIVE
Influenza B by PCR: NEGATIVE
Resp Syncytial Virus by PCR: POSITIVE — AB
SARS Coronavirus 2 by RT PCR: NEGATIVE

## 2023-01-09 LAB — BRAIN NATRIURETIC PEPTIDE: B Natriuretic Peptide: 179.8 pg/mL — ABNORMAL HIGH (ref 0.0–100.0)

## 2023-01-09 MED ORDER — IPRATROPIUM-ALBUTEROL 0.5-2.5 (3) MG/3ML IN SOLN
3.0000 mL | Freq: Once | RESPIRATORY_TRACT | Status: AC
  Administered 2023-01-09: 3 mL via RESPIRATORY_TRACT
  Filled 2023-01-09: qty 3

## 2023-01-09 MED ORDER — ALBUTEROL SULFATE (2.5 MG/3ML) 0.083% IN NEBU
10.0000 mg/h | INHALATION_SOLUTION | Freq: Once | RESPIRATORY_TRACT | Status: AC
  Administered 2023-01-10: 10 mg/h via RESPIRATORY_TRACT
  Filled 2023-01-09: qty 3

## 2023-01-09 MED ORDER — MAGNESIUM SULFATE 2 GM/50ML IV SOLN
2.0000 g | Freq: Once | INTRAVENOUS | Status: AC
  Administered 2023-01-09: 2 g via INTRAVENOUS
  Filled 2023-01-09: qty 50

## 2023-01-09 MED ORDER — METHYLPREDNISOLONE SODIUM SUCC 125 MG IJ SOLR
125.0000 mg | Freq: Once | INTRAMUSCULAR | Status: AC
  Administered 2023-01-09: 125 mg via INTRAVENOUS
  Filled 2023-01-09: qty 2

## 2023-01-09 NOTE — ED Notes (Signed)
Pt found outside again with oxygen tank. This RN educated pt on importance of staying inside. Smell of cigarette smoke once pt returned, pt states that he was not smoking but was standing beside someone who was and was talking to them educated on danger of cigarettes around oxygen tanks. This RN requested that pt stay inside. Pt with labored breathing and c/o ShOB due to "walking too fast". Educated pt on danger of oxygen levels dropping.

## 2023-01-09 NOTE — ED Notes (Signed)
Pt removed oxygen and found outside on sidewalk. This RN advised pt of importance of keeping oxygen on.

## 2023-01-09 NOTE — ED Notes (Signed)
Full oxygen tank provided to pt.

## 2023-01-09 NOTE — ED Triage Notes (Addendum)
Patient here with complaint of shortness of breath, patient states he called 911 and was told "if you don't go to the hospital you might die" and so he had someone drive him to the hospital. Patient arrives on room air, SpO2 74%. Patient states he wears 4L O2 [place] at baseline. Patient is alert, oriented, speaking in complete sentences, denies chest pain but states he was told my EMS that "his heart is fighting something".  SpO2 88% on 4L O2 [place]. Flow rate increased to 6L/min.

## 2023-01-09 NOTE — ED Provider Notes (Signed)
[facility] Provider Note   CSN: [id number] Arrival date & time: 01/09/23  1820     History {Add pertinent medical, surgical, social history, OB history to HPI:1} Chief Complaint  Patient presents with   Shortness of Breath    Richard [name] is a 52 y.o. male with hx of COPD and recently diagnosed lung cancer who presents with concern for SOB. Scheduled to start chemotherapy on 01/18/23. On 4L supplemental O2 via [place] at baseline, requirng 6L today to keep his sats 90% at home. States he has had 1 day of worsening SOB, some improvement with albuterol inhaler at home. States BLE edema, intermittent calf pain in the left though states this is not new.   I have reviewed his medical records. In addition to the above listed history patient has history of ETOH abuse in the past, TIA, HTN, PE, DVT, not on anticoagulation.   HPI     Home Medications Prior to Admission medications   Medication Sig Start Date End Date Taking? Authorizing Provider  albuterol (VENTOLIN HFA) 108 (90 Base) MCG/ACT inhaler Inhale 1-2 puffs into the lungs every 6 (six) hours as needed for wheezing or shortness of breath. Patient not taking: Reported on 01/07/2023 06/03/22   Parrett, Fonnie Mu, NP  [name] 160-4.5 MCG/ACT inhaler Inhale 2 puffs into the lungs daily. Patient not taking: Reported on 12/30/2022 12/22/22   [provider]  cloNIDine (CATAPRES) 0.3 MG tablet Take 1 tablet (0.3 mg total) by mouth 2 (two) times daily. 02/11/22   Florencia Reasons, MD  levETIRAcetam (KEPPRA) 500 MG tablet Take 500 mg by mouth 2 (two) times daily.    [provider]  losartan (COZAAR) 25 MG tablet Take 1 tablet (25 mg total) by mouth daily. Patient taking differently: Take 100 mg by mouth daily. 02/11/22   Florencia Reasons, MD  OXYGEN Inhale 2 L/min into the lungs as needed (for shortness of breath).    [provider]  prochlorperazine (COMPAZINE) 10 MG tablet Take 1 tablet (10 mg  total) by mouth every 6 (six) hours as needed for nausea or vomiting. 01/07/23   Curt Bears, MD      Allergies    Dilaudid [hydromorphone hcl] and Tape    Review of Systems   Review of Systems  Constitutional: Negative.   HENT: Negative.    Respiratory:  Positive for cough, chest tightness and shortness of breath.   Cardiovascular:  Positive for chest pain and leg swelling. Negative for palpitations.  Genitourinary: Negative.   Neurological: Negative.     Physical Exam Updated Vital Signs BP (!) 168/120   Pulse (!) 114   Temp 98.3 F (36.8 C) (Oral)   Resp (!) 31   SpO2 (!) 89%  Physical Exam Vitals and nursing note reviewed.  Constitutional:      Appearance: He is not ill-appearing or toxic-appearing.  HENT:     Head: Normocephalic and atraumatic.     Mouth/Throat:     Mouth: Mucous membranes are moist.     Pharynx: No oropharyngeal exudate or posterior oropharyngeal erythema.  Eyes:     General:        Right eye: No discharge.        Left eye: No discharge.     Extraocular Movements: Extraocular movements intact.     Conjunctiva/sclera: Conjunctivae normal.     Pupils: Pupils are equal, round, and reactive to light.  Cardiovascular:     Rate and Rhythm: Regular  rhythm. Tachycardia present.     Pulses: Normal pulses.     Comments: No calf TTP on exam. Symmetric LE edema Pulmonary:     Effort: Tachypnea, accessory muscle usage and retractions present. No respiratory distress.     Breath sounds: Examination of the right-upper field reveals wheezing. Examination of the left-upper field reveals wheezing. Examination of the left-middle field reveals wheezing. Examination of the right-lower field reveals decreased breath sounds and wheezing. Examination of the left-lower field reveals decreased breath sounds and wheezing. Decreased breath sounds and wheezing present. No rales.     Comments: Now requiring 8L by [place] to maintain sats >90% . Chest:     Chest wall: No mass,  lacerations, deformity, swelling, tenderness or edema.  Abdominal:     General: Bowel sounds are normal. There is no distension.     Tenderness: There is no abdominal tenderness.  Musculoskeletal:        General: No deformity.     Cervical back: Neck supple.     Right lower leg: 1+ Edema present.     Left lower leg: 1+ Edema present.  Skin:    General: Skin is warm and dry.  Neurological:     Mental Status: He is alert. Mental status is at baseline.  Psychiatric:        Mood and Affect: Mood normal.     ED Results / Procedures / Treatments   Labs (all labs ordered are listed, but only abnormal results are displayed) Labs Reviewed  CBC WITH DIFFERENTIAL/PLATELET - Abnormal; Notable for the following components:      Result Value   Neutro Abs 7.8 (*)    All other components within normal limits  COMPREHENSIVE METABOLIC PANEL - Abnormal; Notable for the following components:   CO2 21 (*)    All other components within normal limits  RESP PANEL BY RT-PCR (RSV, FLU A&B, COVID)  RVPGX2  BRAIN NATRIURETIC PEPTIDE  I-STAT VENOUS BLOOD GAS, ED  TROPONIN I (HIGH SENSITIVITY)  TROPONIN I (HIGH SENSITIVITY)    EKG None  Radiology DG Chest 1 View  Result Date: 01/09/2023 CLINICAL DATA:  Cough and shortness of breath starting a few days ago with worsening headaches and lightheadedness and weakness EXAM: CHEST  1 VIEW COMPARISON:  12/28/2022 FINDINGS: Similar appearance of the right hilar mass and architectural distortion in the right upper lung. Stable cardiomediastinal silhouette. No new focal consolidation, pleural effusion, or pneumothorax. No acute osseous abnormality. IMPRESSION: No significant change from 12/28/2022. Right hilar mass and architectural distortion previously evaluated with CT 12/25/2022. Electronically Signed   By: Placido Sou M.D.   On: 01/09/2023 19:19    Procedures Procedures  {Document cardiac monitor, telemetry assessment procedure when  appropriate:1}  Medications Ordered in ED Medications  ipratropium-albuterol (DUONEB) 0.5-2.5 (3) MG/3ML nebulizer solution 3 mL (has no administration in time range)  magnesium sulfate IVPB 2 g 50 mL (has no administration in time range)  methylPREDNISolone sodium succinate (SOLU-MEDROL) 125 mg/2 mL injection 125 mg (has no administration in time range)  ipratropium-albuterol (DUONEB) 0.5-2.5 (3) MG/3ML nebulizer solution 3 mL (has no administration in time range)  ipratropium-albuterol (DUONEB) 0.5-2.5 (3) MG/3ML nebulizer solution 3 mL (has no administration in time range)    ED Course/ Medical Decision Making/ A&P Clinical Course as of 01/09/23 2346  Sat Jan 09, 2023  2344 Patient continues to be wheezy with increased work of breathing requiring 6 L supplemental oxygen by nasal cannula to maintain sats 90 or above following  3 DuoNebs.  At this time we will proceed with continuous albuterol nebulizer treatment and recommend admission for COPD exacerbation likely secondary to RSV infection.  Though hesitant patient is amenable to plan for admission at this time. [RS]    Clinical Course User Index [RS] Mariona Scholes, Gypsy Balsam, PA-C   {   Click here for ABCD2, HEART and other calculatorsREFRESH Note before signing :1}                          Medical Decision Making 52 year old male who presents with concern for SOB.   Hypertension on intake, tachycardia, tachypnea.  Cardiac exam with tachycardia, pulmonary exam with decreased air movement in the bases and wheezing throughout, tachypnea, requiring 8 L supplemental oxygen by nasal cannula at this time.  1+ bilateral lower extremity edema noted, nonpitting.  DDx includes allergic to COPD exacerbation, PE, pleural effusion, pneumonia, obstruction from known pulmonary malignancy, CHF, ACS.  Amount and/or Complexity of Data Reviewed Labs: ordered.    Details: CBC unremarkable, CMP unremarkable, troponin negative x 2. BNP mildly elevated 179,  VBG with normal pH of 7.4.  RSV positive on respiratory panel. Radiology:     Details:  Chest x-ray visualized.  Anterior right hilar mass but otherwise no acute cardiopulmonary disease.  ECG/medicine tests:     Details:  EKG with sinus rhythm, no STEMI.  Risk Prescription drug management. Decision regarding hospitalization.     Clinical patient was consistent with COPD exacerbation likely secondary to RSV infection.  Patient required mission to the hospital for increased oxygen requirement despite multiple nebulized treatments.  {Document critical care time when appropriate:1} {Document review of labs and clinical decision tools ie heart score, Chads2Vasc2 etc:1}  {Document your independent review of radiology images, and any outside records:1} {Document your discussion with family members, caretakers, and with consultants:1} {Document social determinants of health affecting pt's care:1} {Document your decision making why or why not admission, treatments were needed:1} Final Clinical Impression(s) / ED Diagnoses Final diagnoses:  None    Rx / DC Orders ED Discharge Orders     None

## 2023-01-09 NOTE — ED Notes (Signed)
O2 saturation 86% on 4LNC after returning from outside with labored respirations. Increased to [facility] with improvement to 90%.

## 2023-01-09 NOTE — ED Notes (Signed)
Pt found with oxygen turned to 3L, O2 85% on 3LNC. This RN placed pt on 4LNC improved to 90%. Pt states this is baseline O2 saturation. Pt c/o ShOB and swelling in feet.

## 2023-01-09 NOTE — ED Provider Triage Note (Signed)
Emergency Medicine Provider Triage Evaluation Note  Richard [name] , a 52 y.o. male  was evaluated in triage.  Pt complains of shortness of breath  cancer pt   Review of Systems  Positive: cough Negative: fever  Physical Exam  There were no vitals taken for this visit. Gen:   Awake, no distress   Resp:  Normal effort  MSK:   Moves extremities without difficulty  Other:    Medical Decision Making  Medically screening exam initiated at 6:40 PM.  Appropriate orders placed.  Richard [name] was informed that the remainder of the evaluation will be completed by another provider, this initial triage assessment does not replace that evaluation, and the importance of remaining in the ED until their evaluation is complete.     Richard [name], [place] 01/09/23 [phone]

## 2023-01-10 ENCOUNTER — Other Ambulatory Visit: Payer: Self-pay

## 2023-01-10 ENCOUNTER — Encounter (HOSPITAL_COMMUNITY): Payer: Self-pay | Admitting: Infectious Diseases

## 2023-01-10 DIAGNOSIS — I5032 Chronic diastolic (congestive) heart failure: Secondary | ICD-10-CM

## 2023-01-10 DIAGNOSIS — J9601 Acute respiratory failure with hypoxia: Secondary | ICD-10-CM | POA: Diagnosis present

## 2023-01-10 DIAGNOSIS — I159 Secondary hypertension, unspecified: Secondary | ICD-10-CM | POA: Diagnosis not present

## 2023-01-10 DIAGNOSIS — Z9981 Dependence on supplemental oxygen: Secondary | ICD-10-CM | POA: Diagnosis not present

## 2023-01-10 DIAGNOSIS — J440 Chronic obstructive pulmonary disease with acute lower respiratory infection: Secondary | ICD-10-CM | POA: Diagnosis present

## 2023-01-10 DIAGNOSIS — J441 Chronic obstructive pulmonary disease with (acute) exacerbation: Secondary | ICD-10-CM | POA: Diagnosis present

## 2023-01-10 DIAGNOSIS — Z87898 Personal history of other specified conditions: Secondary | ICD-10-CM

## 2023-01-10 DIAGNOSIS — Z79899 Other long term (current) drug therapy: Secondary | ICD-10-CM | POA: Diagnosis not present

## 2023-01-10 DIAGNOSIS — I110 Hypertensive heart disease with heart failure: Secondary | ICD-10-CM | POA: Diagnosis present

## 2023-01-10 DIAGNOSIS — F17210 Nicotine dependence, cigarettes, uncomplicated: Secondary | ICD-10-CM | POA: Diagnosis present

## 2023-01-10 DIAGNOSIS — J210 Acute bronchiolitis due to respiratory syncytial virus: Secondary | ICD-10-CM

## 2023-01-10 DIAGNOSIS — Z8673 Personal history of transient ischemic attack (TIA), and cerebral infarction without residual deficits: Secondary | ICD-10-CM | POA: Diagnosis not present

## 2023-01-10 DIAGNOSIS — Z923 Personal history of irradiation: Secondary | ICD-10-CM | POA: Diagnosis not present

## 2023-01-10 DIAGNOSIS — R0602 Shortness of breath: Secondary | ICD-10-CM | POA: Diagnosis present

## 2023-01-10 DIAGNOSIS — G40909 Epilepsy, unspecified, not intractable, without status epilepticus: Secondary | ICD-10-CM | POA: Diagnosis present

## 2023-01-10 DIAGNOSIS — I272 Pulmonary hypertension, unspecified: Secondary | ICD-10-CM | POA: Diagnosis present

## 2023-01-10 DIAGNOSIS — Z1152 Encounter for screening for COVID-19: Secondary | ICD-10-CM | POA: Diagnosis not present

## 2023-01-10 DIAGNOSIS — I5033 Acute on chronic diastolic (congestive) heart failure: Secondary | ICD-10-CM | POA: Insufficient documentation

## 2023-01-10 DIAGNOSIS — C3411 Malignant neoplasm of upper lobe, right bronchus or lung: Secondary | ICD-10-CM | POA: Diagnosis present

## 2023-01-10 DIAGNOSIS — J9621 Acute and chronic respiratory failure with hypoxia: Secondary | ICD-10-CM | POA: Diagnosis present

## 2023-01-10 DIAGNOSIS — M25562 Pain in left knee: Secondary | ICD-10-CM | POA: Diagnosis present

## 2023-01-10 MED ORDER — PREDNISONE 20 MG PO TABS
40.0000 mg | ORAL_TABLET | Freq: Every day | ORAL | Status: DC
  Administered 2023-01-10 – 2023-01-12 (×3): 40 mg via ORAL
  Filled 2023-01-10 (×3): qty 2

## 2023-01-10 MED ORDER — AZITHROMYCIN 250 MG PO TABS
500.0000 mg | ORAL_TABLET | Freq: Every day | ORAL | Status: DC
  Administered 2023-01-10: 500 mg via ORAL
  Filled 2023-01-10 (×2): qty 2

## 2023-01-10 MED ORDER — FLUTICASONE FUROATE-VILANTEROL 200-25 MCG/ACT IN AEPB
1.0000 | INHALATION_SPRAY | Freq: Every day | RESPIRATORY_TRACT | Status: DC
  Administered 2023-01-10 – 2023-01-12 (×2): 1 via RESPIRATORY_TRACT
  Filled 2023-01-10 (×3): qty 28

## 2023-01-10 MED ORDER — LOSARTAN POTASSIUM 50 MG PO TABS
100.0000 mg | ORAL_TABLET | Freq: Every day | ORAL | Status: DC
  Administered 2023-01-10 – 2023-01-12 (×2): 100 mg via ORAL
  Filled 2023-01-10 (×3): qty 2

## 2023-01-10 MED ORDER — SENNA 8.6 MG PO TABS: 1.0000 | ORAL_TABLET | Freq: Two times a day (BID) | ORAL | Status: DC

## 2023-01-10 MED ORDER — IPRATROPIUM-ALBUTEROL 0.5-2.5 (3) MG/3ML IN SOLN
3.0000 mL | Freq: Four times a day (QID) | RESPIRATORY_TRACT | Status: DC
  Administered 2023-01-10 – 2023-01-12 (×11): 3 mL via RESPIRATORY_TRACT
  Filled 2023-01-10 (×11): qty 3

## 2023-01-10 MED ORDER — ACETAMINOPHEN 325 MG PO TABS: 650.0000 mg | ORAL_TABLET | Freq: Four times a day (QID) | ORAL | Status: DC | PRN

## 2023-01-10 MED ORDER — CLONIDINE HCL 0.2 MG PO TABS
0.3000 mg | ORAL_TABLET | Freq: Two times a day (BID) | ORAL | Status: DC
  Administered 2023-01-10 – 2023-01-12 (×4): 0.3 mg via ORAL
  Filled 2023-01-10 (×6): qty 1

## 2023-01-10 MED ORDER — LOSARTAN POTASSIUM 50 MG PO TABS: 100.0000 mg | ORAL_TABLET | Freq: Every day | ORAL | Status: DC

## 2023-01-10 MED ORDER — ENOXAPARIN SODIUM 40 MG/0.4ML IJ SOSY
40.0000 mg | PREFILLED_SYRINGE | Freq: Every day | INTRAMUSCULAR | Status: DC
  Administered 2023-01-10 – 2023-01-12 (×2): 40 mg via SUBCUTANEOUS
  Filled 2023-01-10 (×3): qty 0.4

## 2023-01-10 MED ORDER — ACETAMINOPHEN 650 MG RE SUPP: 650.0000 mg | Freq: Four times a day (QID) | RECTAL | Status: DC | PRN

## 2023-01-10 MED ORDER — LEVETIRACETAM 500 MG PO TABS
500.0000 mg | ORAL_TABLET | Freq: Two times a day (BID) | ORAL | Status: DC
  Administered 2023-01-10 – 2023-01-12 (×3): 500 mg via ORAL
  Filled 2023-01-10 (×6): qty 1

## 2023-01-10 MED ORDER — LOSARTAN POTASSIUM 50 MG PO TABS: 25.0000 mg | ORAL_TABLET | Freq: Every day | ORAL | Status: DC

## 2023-01-10 NOTE — Progress Notes (Signed)
Interval history Still feeling short of breath on 6 L HFNC. Besides his cough, he is starting to feel a little better. Denies any chest pain. He is still smoking but is trying to quit. Nicotine patches don't help. He has not tried medication for smoking cessation.  Physical exam Blood pressure (!) 133/98, pulse 90, temperature 97.9 F (36.6 C), resp. rate (!) 25, SpO2 90 %.  Comfortable appearing Heart rate and rhythm are regular, radial pulse 2+, no lower extremity edema Tachypneic, otherwise unlabored breathing, lungs with diffuse crackles but no wheezing, decreased sounds bilateral bases Abdomen is soft and nontender Skin is warm and dry Alert and oriented without gross focal neurologic deficits Pleasant, mood and affect are concordant  Labs VBG 7.408/34.8/62/23 RSV positive  Images and other studies CXR without signs of airspace infiltration, stable right hilar mass  Assessment and plan Hospital day 0  Richard [name] is a 52 y.o. with chronic hypoxic respiratory failure due to COPD, lung cancer, admitted for dyspnea and increasing oxygen requirement in setting of RSV infection  Principal Problem:   Acute hypoxic respiratory failure (HCC) Active Problems:   COPD exacerbation (HCC)   Chronic heart failure with preserved ejection fraction (HCC)   Acute bronchiolitis due to respiratory syncytial virus (RSV)   History of seizures  Acute on chronic hypoxic respiratory failure COPD exacerbation RSV as underlying cause.  Stable today.  Requiring 6 to 8 L/min to maintain SpO2 around 90.  No consolidation on imaging suggestive of pneumonia.  If he does not improve with this line of therapy, consider PE. - Azithromycin 500 mg p.o. daily x 3 doses - DuoNebs every 6 hours - Prednisone 40 mg p.o. daily x 4 doses - Continue home maintenance inhaler  Right upper lobe lung cancer Stage IIIb Follows with Dr. Earlie Server of oncology.  Scheduled to begin carboplatin plus  paclitaxel plus x-ray therapy on 01/18/2023.  However, this patient does not have a port in place for chemo. - Investigate need for access for chemotherapy  Tobacco use disorder Continues to smoke, about 1/2 pack a day.  Has never tried medication for tobacco cessation.  Cost may be an issue.  Not interested in nicotine replacement therapy.  He does express an interest in quitting. - Will counsel about medical therapy  Chronic diastolic heart failure My impression is that this patient is euvolemic.  History of seizures Has not had a seizure in several years - Continue levetiracetam 500 mg twice daily  Hypertension Last BP 133/98.  Continue home regimen - Losartan 100 mg daily - Clonidine 0.3 mg p.o. twice daily  Diet: Heart healthy IVF: none VTE: lovenox Code: Full  [name] MD 01/10/2023, 10:50 AM  Pager: [phone] After 5pm on weekdays and 1pm on weekends: [phone]

## 2023-01-10 NOTE — ED Notes (Signed)
Pt refused hospital gown.

## 2023-01-10 NOTE — ED Notes (Signed)
Pt arrives to room 18 from lobby. Pt currently on 6 L via nasal cannula at 90%. States typically on oxygen at 3-4 L at home. Pt continues to smoke. Was informed of importance of staying on oxygen while in the room. Pt was offered a hospital gown to which he refused. Pt states he does not want to stay in the hospital.

## 2023-01-10 NOTE — ED Notes (Signed)
Patient having intermittent episodes of tachycardia. Unable to obtain EKG at time of episode.

## 2023-01-10 NOTE — Progress Notes (Deleted)
Date: 01/10/2023               Patient Name:  Richard [name] MRN: [id number]  DOB: [birth year] Age / Sex: 52 y.o., male   PCP: Medicine, [facility]         Medical Service: Internal Medicine Teaching Service         Attending Physician: : Dr.  Johnnye Sima    First Contact: [name], MD      Pager: MM [phone]      Second Contact: Buddy Duty, DO      Pager: RA [phone]           After Hours (After 5p/  First Contact Pager: [phone]  weekends / holidays): Second Contact Pager: [phone]   SUBJECTIVE   Chief Complaint: Shortness of breath  History of Present Illness: Richard [name] is a 21 old male with PMHx tobacco use disorder, chronic hypoxic respiratory failure 2/2 COPD on 3L O2, Stage IIIB lung cancer, HFpEF, seizure disorder and prior TIA presenting with shortness of breath  Patient was recently hospitalized for acute worsening of dyspnea without suspicion for acute COPD exacerbation. Patient was found to have progression of know R lung cancer, MRI brain negative, CT-guided biopsy done, and patient is being followed by oncology outpatient fand is scheduled to start chemotherapy on 01/18/23.  Today he presents with a 4 day history of increased shortness of breath associated with coughing, sputum production without hematemesis, some throat discomfort, rhinorrhea, lacrimation.  His appetite has been low but has been drinking fluids. He has had to increase his O2 at home to 6L to be able to keep saturations at 90% but felt like he was still struggling. He was using his maintenance and albuterol inhaler, with minimal improvement.  Patient denies fever, chills, GI disturbances. He denies lightheadedness,  dizziness, HA or chest pain. He denies orthopnea, feeling swollen or gained weight. He has chronic L>R lower extremity edema that has been worked up and has been unchanged per patient.   Patient reports compliance with all medications. He continues to smoke.    In the ED, patient was found tachycardic, tachyneic, and BP 168/120. Patient requiring 8L O2 to maintain O2 sat at 90%. BMP and CBC wnl. Troponin negative. BNP 179, mildly elevated compared to prior. Normal pH on VBG. EKG in sinus rhythm. RVP + RSV. CXR no significant changed from 12/28/2022. R hilar mass present. Patient was started on Solumedrol, duonebs x3, magnesium.  WOB improved but still with low oxygen saturations. IMTS was consulted for admission for AHRF management.    Meds:  Clonidine 0.3 mg BID Losartan 25 mg daily Keppra 500 mg BID [name] 2 puff daily (ICS-LABA) Albuterol PRN  Past Medical History  Past Surgical History:  Procedure Laterality Date   BRONCHIAL BIOPSY  02/10/2022   Procedure: BRONCHIAL BIOPSIES;  Surgeon: Rigoberto Noel, MD;  Location: [facility];  Service: Cardiopulmonary;;   BRONCHIAL BRUSHINGS  02/10/2022   Procedure: BRONCHIAL BRUSHINGS;  Surgeon: Rigoberto Noel, MD;  Location: [facility];  Service: Cardiopulmonary;;   BRONCHIAL NEEDLE ASPIRATION BIOPSY  02/10/2022   Procedure: BRONCHIAL NEEDLE ASPIRATION BIOPSIES;  Surgeon: Rigoberto Noel, MD;  Location: [facility];  Service: Cardiopulmonary;;   BRONCHIAL WASHINGS  02/10/2022   Procedure: BRONCHIAL WASHINGS;  Surgeon: Rigoberto Noel, MD;  Location: [facility];  Service: Cardiopulmonary;;   ENDOBRONCHIAL ULTRASOUND Bilateral 02/10/2022   Procedure: ENDOBRONCHIAL ULTRASOUND;  Surgeon: Rigoberto Noel, MD;  Location: [facility];  Service: Cardiopulmonary;  Laterality: Bilateral;   LEG SURGERY     VIDEO BRONCHOSCOPY  02/10/2022   Procedure: VIDEO BRONCHOSCOPY WITHOUT FLUORO;  Surgeon: Rigoberto Noel, MD;  Location: [facility];  Service: Cardiopulmonary;;    Social:  Lives with wife, currently on disability.  PCP: Medicine, [facility] Substances: tobacco: 34-pack-year history, currently down to <1/2 ppd since last hospitalization. Social drinker. Denies current cocaine  or illicit  drug use  Family History: Cancer - father DM - sister  Allergies: Allergies as of 01/09/2023 - Review Complete 01/09/2023  Allergen Reaction Noted   Dilaudid [hydromorphone hcl] Nausea Only and Other (See Comments) 10/30/2012   Tape Itching and Dermatitis 11/06/2019    Review of Systems: A complete ROS was negative except as per HPI.   OBJECTIVE:   Physical Exam: Blood pressure (!) 142/98, pulse (!) 102, temperature 98.3 F (36.8 C), temperature source Oral, resp. rate (!) 36, SpO2 (!) 89 %.  Constitutional: well-appearing man sitting in bed, in no acute distress HENT: normocephalic atraumatic, mucous membranes moist Cardiovascular: tachycardia, no m/r/g, no JVD Pulmonary/Chest: normal work of breathing on [place], lpoor air movement in the bilateral bases, rhonchi on anterior chest, no crackles noticed on exam. Mild expiratory wheezing throughout.  Abdominal: soft, non-tender, non-distended. No fluid wave  Neurological: alert & oriented x 3 MSK: bilateral nail clubbing, 1+ pitting edema bilaterally, no TTP or with passive ROM  Skin: warm and dry Psych: Normal mood and affect  Labs: CBC    Component Value Date/Time   WBC 9.7 01/09/2023 1839   RBC 5.08 01/09/2023 1839   HGB 15.3 01/09/2023 2251   HGB 16.6 01/07/2023 1339   HCT 45.0 01/09/2023 2251   PLT 290 01/09/2023 1839   PLT 264 01/07/2023 1339   MCV 95.5 01/09/2023 1839   MCH 32.7 01/09/2023 1839   MCHC 34.2 01/09/2023 1839   RDW 14.9 01/09/2023 1839   LYMPHSABS 1.0 01/09/2023 1839   MONOABS 0.8 01/09/2023 1839   EOSABS 0.0 01/09/2023 1839   BASOSABS 0.0 01/09/2023 1839     CMP     Component Value Date/Time   NA 144 01/09/2023 2251   K 4.8 01/09/2023 2251   CL 107 01/09/2023 1839   CO2 21 (L) 01/09/2023 1839   GLUCOSE 91 01/09/2023 1839   BUN 9 01/09/2023 1839   CREATININE 1.04 01/09/2023 1839   CREATININE 0.97 01/07/2023 1339   CREATININE 0.91 12/05/2013 1605   CALCIUM 9.1 01/09/2023 1839   PROT 7.2  01/09/2023 1839   ALBUMIN 3.6 01/09/2023 1839   AST 18 01/09/2023 1839   AST 14 (L) 01/07/2023 1339   ALT 15 01/09/2023 1839   ALT 13 01/07/2023 1339   ALKPHOS 61 01/09/2023 1839   BILITOT 0.5 01/09/2023 1839   BILITOT 0.4 01/07/2023 1339   GFRNONAA >60 01/09/2023 1839   GFRNONAA >60 01/07/2023 1339   GFRNONAA >89 12/05/2013 1605   GFRAA >60 05/09/2020 0108   GFRAA >89 12/05/2013 1605    Imaging: DG Chest 1 View  Result Date: 01/09/2023 CLINICAL DATA:  Cough and shortness of breath starting a few days ago with worsening headaches and lightheadedness and weakness EXAM: CHEST  1 VIEW COMPARISON:  12/28/2022 FINDINGS: Similar appearance of the right hilar mass and architectural distortion in the right upper lung. Stable cardiomediastinal silhouette. No new focal consolidation, pleural effusion, or pneumothorax. No acute osseous abnormality. IMPRESSION: No significant change from 12/28/2022. Right hilar mass and architectural distortion previously evaluated with CT 12/25/2022. Electronically Signed  By: Placido Sou M.D.   On: 01/09/2023 19:19      EKG: personally reviewed my interpretation is sinus rhythm, with evidence of LVH on lateral leads and atrial enlargement on lead II. No acute signs of ischemia. Prior EKG with similar findings  ASSESSMENT & PLAN:   Assessment & Plan by Problem: Principal Problem:   Acute hypoxic respiratory failure (HCC)   Richard [name] is a 69 old male with PMHx tobacco use disorder, chronic hypoxic respiratory failure 2/2 COPD on 3L O2, Stage IIIB lung cancer, HFpEF, seizure disorder and prior TIA presenting with shortness of breath and admitted for acute on chronic hypoxic 2/2 COPD exacerbation in the setting of RSV infection  Acute on chronic hypoxic respiratory failure COPD exacerbation RSV + Patient with URI symptoms, new cough, sputum production, and worsening chronic hypoxic respiratory failure requiring 6-8L of O2 to maintain saturations >90%.  RSV+, without evidence of consolidation, new interstitial lung disease, or volume overload on CXR. Patient is euvolemic on exam. BMP and CBC wnl. Suspect this is likely in the setting of COPD exacerbation 2/2 RSV infection. It is also possible that this is also exacerbated by history of lung cancer with  and current smoking status.  Patient was given one dose of IV solumedrol 125 mg, s/p LAMA-SABA duonebs x3, and continuous albuterol nebulizer, improving but still requiring 6L O2.  -Solumedrol 125 mg 01/09/2023 -Prednisone x4, start 1/28 -Breo Ellipta (ICS-LABA) daily -Duoneb V6HM -Flutter valve and IS -Goal saturation 88-94%  Tachycardia Patient was in NSR on admission. Likely secondary to B2 agonist inhalers since admission. He does not appear volume down on exam and his renal function is wnl. Though patient is tachypnea and tachycardic, low suspicion for sepsis at this time. Will continue to monitor. If not improvement, low threshold for repeating CTA chest to rule out PE given hx of lung cancer. -cardiac telemetry -Continue to monitor  HTN Patient has been hypertensive since admission. Patient has not taken medications for the past two days as he was in the ED. Dyspnea and increased work of breathing likely contributing. Restarted - Clonidine 0.3 mg BID - Losartan 100 mg daily  RUL lung cancer Stage IIIB  Please see 12/28/2022 discharge summary and 01/07/23 oncology note for details. Briefly, Bronchogenic carcinoma dx in 2021. Progressive enlargement in early 2023; s/p radiation in 02/2022  with reduction in main mass size to 7.2 cm and satellite nodule (1.2 cm), however, 2 new small nodules in RUL noted in 6/23. Lost to follow up in 2023.  12/28/2022 CTA chest with slight enlargement of mass, stable satellite mass (2 cm), and interval enlargement of paratracheal LN. Oncology and pulm were consulted; MRI brain negative, CT-guided biopsy by IR with poorly differentiated carcinoma with  neuroendocrine features. 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   01/07/2023 OV with Dr. Julien Nordmann; plan is to start carboplatin and paclitaxel for 6 weeks, followed by immunotherapy if no disease progression. Patient will also follow with Dr. Lisbeth Renshaw for radiation thx. -Outpatient chemotherapy 01/18/2023 -F/u Dr. Lisbeth Renshaw for radiotherapy  HFpEF 12/2022 TTE EF 60-65%, moderate LVH, G1DD. Euvolemic on exam other than chronic history of LE edema recently worked up with LE VAS [place]. Trops negative.  -strict in/outs -standing weights daily   LE edema L>R worked up at recent hospitalization with DVT and PE ruled out. No tenderness to palpation or pain with ROM. -continue to  monitor  Pulmonary HTN Likely secondary to COPD 12/28/2022 CTA with stable main pulmonary artery dilatation   Seizures Chronic, stable issue. Last seizure in 2014 -continue Keppra 500mg  BID  Tobacco use disorder Offered nicotine patch, denied. -Counseled on cessation  Diet: Heart Healthy VTE: Enoxaparin Code: Full  Prior to Admission Living Arrangement: Home, living wife Anticipated Discharge Location: Home Barriers to Discharge: clinical improvement  Dispo: Admit patient to Observation with expected length of stay less than 2 midnights.  Signed:   Romana Juniper, MD Internal Medicine Resident PGY-1 01/10/2023, 2:31 AM

## 2023-01-10 NOTE — H&P (Signed)
Date: 01/10/2023               Patient Name:  Richard [name] MRN: [id number]  DOB: Mar 01, 1971 Age / Sex: 52 y.o., male   PCP: Medicine, [facility]         Medical Service: Internal Medicine Teaching Service         Attending Physician: : Dr. Johnnye Sima    First Contact: [name], MD      Pager: MM [phone]      Second Contact: Buddy Duty, DO      Pager: RA [phone]           After Hours (After 5p/  First Contact Pager: [phone]  weekends / holidays): Second Contact Pager: [phone]   SUBJECTIVE   Chief Complaint: Shortness of breath  History of Present Illness: Richard [name] is a 48 old male with PMHx tobacco use disorder, chronic hypoxic respiratory failure 2/2 COPD on 3L O2, Stage IIIB lung cancer, HFpEF, seizure disorder and prior TIA presenting with shortness of breath  Patient was recently hospitalized for acute worsening of dyspnea without suspicion for acute COPD exacerbation. Patient was found to have progression of know R lung cancer, MRI brain negative, CT-guided biopsy done, and patient is being followed by oncology outpatient fand is scheduled to start chemotherapy on 01/18/23.  Today he presents with a 4 day history of increased shortness of breath associated with coughing, sputum production without hematemesis, some throat discomfort, rhinorrhea, lacrimation.  His appetite has been low but has been drinking fluids. He has had to increase his O2 at home to 6L to be able to keep saturations at 90% but felt like he was still struggling. He was using his maintenance and albuterol inhaler, with minimal improvement.  Patient denies fever, chills, GI disturbances. He denies lightheadedness,  dizziness, HA or chest pain. He denies orthopnea, feeling swollen or gained weight. He has chronic L>R lower extremity edema that has been worked up and has been unchanged per patient.   Patient reports compliance with all medications. He continues to smoke.    In the ED, patient was found tachycardic, tachyneic, and BP 168/120. Patient requiring 8L O2 to maintain O2 sat at 90%. BMP and CBC wnl. Troponin negative. BNP 179, mildly elevated compared to prior. Normal pH on VBG. EKG in sinus rhythm. RVP + RSV. CXR no significant changed from 12/28/2022. R hilar mass present. Patient was started on Solumedrol, duonebs x3, magnesium.  WOB improved but still with low oxygen saturations. IMTS was consulted for admission for AHRF management.    Meds:  Clonidine 0.3 mg BID Losartan 25 mg daily Keppra 500 mg BID [name] 2 puff daily (ICS-LABA) Albuterol PRN  Past Medical History  Past Surgical History:  Procedure Laterality Date   BRONCHIAL BIOPSY  02/10/2022   Procedure: BRONCHIAL BIOPSIES;  Surgeon: Rigoberto Noel, MD;  Location: [facility];  Service: Cardiopulmonary;;   BRONCHIAL BRUSHINGS  02/10/2022   Procedure: BRONCHIAL BRUSHINGS;  Surgeon: Rigoberto Noel, MD;  Location: [facility];  Service: Cardiopulmonary;;   BRONCHIAL NEEDLE ASPIRATION BIOPSY  02/10/2022   Procedure: BRONCHIAL NEEDLE ASPIRATION BIOPSIES;  Surgeon: Rigoberto Noel, MD;  Location: [facility];  Service: Cardiopulmonary;;   BRONCHIAL WASHINGS  02/10/2022   Procedure: BRONCHIAL WASHINGS;  Surgeon: Rigoberto Noel, MD;  Location: [facility];  Service: Cardiopulmonary;;   ENDOBRONCHIAL ULTRASOUND Bilateral 02/10/2022   Procedure: ENDOBRONCHIAL ULTRASOUND;  Surgeon: Rigoberto Noel, MD;  Location: [facility];  Service: Cardiopulmonary;  Laterality: Bilateral;   LEG SURGERY     VIDEO BRONCHOSCOPY  02/10/2022   Procedure: VIDEO BRONCHOSCOPY WITHOUT FLUORO;  Surgeon: Rigoberto Noel, MD;  Location: [facility];  Service: Cardiopulmonary;;    Social:  Lives with wife, currently on disability.  PCP: Medicine, [facility] Substances: tobacco: 34-pack-year history, currently down to <1/2 ppd since last hospitalization. Social drinker. Denies current cocaine  or illicit  drug use  Family History: Cancer - father DM - sister  Allergies: Allergies as of 01/09/2023 - Review Complete 01/09/2023  Allergen Reaction Noted   Dilaudid [hydromorphone hcl] Nausea Only and Other (See Comments) 10/30/2012   Tape Itching and Dermatitis 11/06/2019    Review of Systems: A complete ROS was negative except as per HPI.   OBJECTIVE:   Physical Exam: Blood pressure (!) 142/98, pulse (!) 102, temperature 98.3 F (36.8 C), temperature source Oral, resp. rate (!) 36, SpO2 (!) 89 %.  Constitutional: well-appearing man sitting in bed, in no acute distress HENT: normocephalic atraumatic, mucous membranes moist Cardiovascular: tachycardia, no m/r/g, no JVD Pulmonary/Chest: normal work of breathing on [place], lpoor air movement in the bilateral bases, rhonchi on anterior chest, no crackles noticed on exam. Mild expiratory wheezing throughout.  Abdominal: soft, non-tender, non-distended. No fluid wave  Neurological: alert & oriented x 3 MSK: bilateral nail clubbing, 1+ pitting edema bilaterally, no TTP or with passive ROM  Skin: warm and dry Psych: Normal mood and affect  Labs: CBC    Component Value Date/Time   WBC 9.7 01/09/2023 1839   RBC 5.08 01/09/2023 1839   HGB 15.3 01/09/2023 2251   HGB 16.6 01/07/2023 1339   HCT 45.0 01/09/2023 2251   PLT 290 01/09/2023 1839   PLT 264 01/07/2023 1339   MCV 95.5 01/09/2023 1839   MCH 32.7 01/09/2023 1839   MCHC 34.2 01/09/2023 1839   RDW 14.9 01/09/2023 1839   LYMPHSABS 1.0 01/09/2023 1839   MONOABS 0.8 01/09/2023 1839   EOSABS 0.0 01/09/2023 1839   BASOSABS 0.0 01/09/2023 1839     CMP     Component Value Date/Time   NA 144 01/09/2023 2251   K 4.8 01/09/2023 2251   CL 107 01/09/2023 1839   CO2 21 (L) 01/09/2023 1839   GLUCOSE 91 01/09/2023 1839   BUN 9 01/09/2023 1839   CREATININE 1.04 01/09/2023 1839   CREATININE 0.97 01/07/2023 1339   CREATININE 0.91 12/05/2013 1605   CALCIUM 9.1 01/09/2023 1839   PROT 7.2  01/09/2023 1839   ALBUMIN 3.6 01/09/2023 1839   AST 18 01/09/2023 1839   AST 14 (L) 01/07/2023 1339   ALT 15 01/09/2023 1839   ALT 13 01/07/2023 1339   ALKPHOS 61 01/09/2023 1839   BILITOT 0.5 01/09/2023 1839   BILITOT 0.4 01/07/2023 1339   GFRNONAA >60 01/09/2023 1839   GFRNONAA >60 01/07/2023 1339   GFRNONAA >89 12/05/2013 1605   GFRAA >60 05/09/2020 0108   GFRAA >89 12/05/2013 1605    Imaging: DG Chest 1 View  Result Date: 01/09/2023 CLINICAL DATA:  Cough and shortness of breath starting a few days ago with worsening headaches and lightheadedness and weakness EXAM: CHEST  1 VIEW COMPARISON:  12/28/2022 FINDINGS: Similar appearance of the right hilar mass and architectural distortion in the right upper lung. Stable cardiomediastinal silhouette. No new focal consolidation, pleural effusion, or pneumothorax. No acute osseous abnormality. IMPRESSION: No significant change from 12/28/2022. Right hilar mass and architectural distortion previously evaluated with CT 12/25/2022. Electronically Signed  By: Placido Sou M.D.   On: 01/09/2023 19:19      EKG: personally reviewed my interpretation is sinus rhythm, with evidence of LVH on lateral leads and atrial enlargement on lead II. No acute signs of ischemia. Prior EKG with similar findings  ASSESSMENT & PLAN:   Assessment & Plan by Problem: Principal Problem:   Acute hypoxic respiratory failure (HCC)   Richard [name] is a 34 old male with PMHx tobacco use disorder, chronic hypoxic respiratory failure 2/2 COPD on 3L O2, Stage IIIB lung cancer, HFpEF, seizure disorder and prior TIA presenting with shortness of breath and admitted for acute on chronic hypoxic 2/2 COPD exacerbation in the setting of RSV infection  Acute on chronic hypoxic respiratory failure COPD exacerbation RSV + Patient with URI symptoms, new cough, sputum production, and worsening chronic hypoxic respiratory failure requiring 6-8L of O2 to maintain saturations >90%.  RSV+, without evidence of consolidation, new interstitial lung disease, or volume overload on CXR. Patient is euvolemic on exam. BMP and CBC wnl. Suspect this is likely in the setting of COPD exacerbation 2/2 RSV infection. It is also possible that this is also exacerbated by history of lung cancer with  and current smoking status.  Patient was given one dose of IV solumedrol 125 mg, s/p LAMA-SABA duonebs x3, and continuous albuterol nebulizer, improving but still requiring 6L O2.  -Solumedrol 125 mg 01/09/2023 -Prednisone x4, start 1/28 -Breo Ellipta (ICS-LABA) daily -Duoneb G2BW -Flutter valve and IS -Goal saturation 88-94%  Tachycardia Patient was in NSR on admission. Likely secondary to B2 agonist inhalers since admission. He does not appear volume down on exam and his renal function is wnl. Though patient is tachypnea and tachycardic, low suspicion for sepsis at this time. Will continue to monitor. If not improvement, low threshold for repeating CTA chest to rule out PE given hx of lung cancer. -cardiac telemetry -Continue to monitor  HTN Patient has been hypertensive since admission. Patient has not taken medications for the past two days as he was in the ED. Dyspnea and increased work of breathing likely contributing. Restarted - Clonidine 0.3 mg BID - Losartan 100 mg daily  RUL lung cancer Stage IIIB  Please see 12/28/2022 discharge summary and 01/07/23 oncology note for details. Briefly, Bronchogenic carcinoma dx in 2021. Progressive enlargement in early 2023; s/p radiation in 02/2022  with reduction in main mass size to 7.2 cm and satellite nodule (1.2 cm), however, 2 new small nodules in RUL noted in 6/23. Lost to follow up in 2023.  12/28/2022 CTA chest with slight enlargement of mass, stable satellite mass (2 cm), and interval enlargement of paratracheal LN. Oncology and pulm were consulted; MRI brain negative, CT-guided biopsy by IR with poorly differentiated carcinoma with  neuroendocrine features. 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   01/07/2023 OV with Dr. Julien Nordmann; plan is to start carboplatin and paclitaxel for 6 weeks, followed by immunotherapy if no disease progression. Patient will also follow with Dr. Lisbeth Renshaw for radiation thx. -Outpatient chemotherapy 01/18/2023 -F/u Dr. Lisbeth Renshaw for radiotherapy  HFpEF 12/2022 TTE EF 60-65%, moderate LVH, G1DD. Euvolemic on exam other than chronic history of LE edema recently worked up with LE VAS [place]. Trops negative.  -strict in/outs -standing weights daily   LE edema L>R worked up at recent hospitalization with DVT and PE ruled out. No tenderness to palpation or pain with ROM. -continue to  monitor  Pulmonary HTN Likely secondary to COPD 12/28/2022 CTA with stable main pulmonary artery dilatation   Seizures Chronic, stable issue. Last seizure in 2014 -continue Keppra 500mg  BID  Tobacco use disorder Offered nicotine patch, denied. -Counseled on cessation  Diet: Heart Healthy VTE: Enoxaparin Code: Full  Prior to Admission Living Arrangement: Home, living wife Anticipated Discharge Location: Home Barriers to Discharge: clinical improvement  Dispo: Admit patient to Observation with expected length of stay less than 2 midnights.  Signed:   Romana Juniper, MD Internal Medicine Resident PGY-1 01/10/2023, 2:31 AM

## 2023-01-10 NOTE — Progress Notes (Signed)
Dr. Carin Primrose and I re-evaluated the patient at 1345 as his O2 requirement significantly increased, up to 12 L HFNC. Upon entering the room, the patient is sitting up in bed comfortably and is eating lunch. He notes that he does not feel more short of breath than he did this morning and he does not have any chest pain. The rest of his vital signs are stable - he is not tachycardic and BP is 139/75. His supplemental O2 was turned down to 10 L HFNC and his sats remained >88%.   Upon further questioning, the patient reveals that he is hypoxic at home despite his 3 L . He notes that his O2 sat is usually in the 80s (as low as 80, but usually around 83-87%) and gets as high as 93%.   Physical exam: Comfortable appearing male sitting up in bed, NAD Regular rate and rhythm Non-labored breathing on 10 L HFNC, diffuse crackles and decreased lung sounds in bilateral bases.   A/P: We were initially concerned that the patient may have a PE (he is certainly high risk given his hx of lung cancer), however, given that he is not tachycardic and is otherwise stable, our suspicion is now lower. Will hold off on CTA imaging at this time, however, if patient continues to have an increase in his O2 requirements, if he becomes tachycardic, or more tachypneic, would obtain CTA.    Buddy Duty, DO Internal Medicine Resident, PGY-2

## 2023-01-10 NOTE — Hospital Course (Addendum)
Principal Problem:   Acute hypoxic respiratory failure (HCC) Active Problems:   COPD exacerbation (HCC)   Chronic heart failure with preserved ejection fraction (HCC)   Acute bronchiolitis due to respiratory syncytial virus (RSV)   History of seizures  Resolved Problems:   * No resolved hospital problems. *  Consults:***  Procedures:***  Follow-up items: - meds for smoking - port for chemo

## 2023-01-11 ENCOUNTER — Inpatient Hospital Stay (HOSPITAL_COMMUNITY): Payer: 59

## 2023-01-11 DIAGNOSIS — J9621 Acute and chronic respiratory failure with hypoxia: Principal | ICD-10-CM

## 2023-01-11 DIAGNOSIS — M25562 Pain in left knee: Secondary | ICD-10-CM | POA: Insufficient documentation

## 2023-01-11 DIAGNOSIS — F17210 Nicotine dependence, cigarettes, uncomplicated: Secondary | ICD-10-CM | POA: Diagnosis not present

## 2023-01-11 DIAGNOSIS — J441 Chronic obstructive pulmonary disease with (acute) exacerbation: Secondary | ICD-10-CM

## 2023-01-11 LAB — CBC
HCT: 45.9 % (ref 39.0–52.0)
Hemoglobin: 15 g/dL (ref 13.0–17.0)
MCH: 31.7 pg (ref 26.0–34.0)
MCHC: 32.7 g/dL (ref 30.0–36.0)
MCV: 97 fL (ref 80.0–100.0)
Platelets: 263 10*3/uL (ref 150–400)
RBC: 4.73 MIL/uL (ref 4.22–5.81)
RDW: 14.7 % (ref 11.5–15.5)
WBC: 11.6 10*3/uL — ABNORMAL HIGH (ref 4.0–10.5)
nRBC: 0 % (ref 0.0–0.2)

## 2023-01-11 LAB — BASIC METABOLIC PANEL
Anion gap: 9 (ref 5–15)
BUN: 11 mg/dL (ref 6–20)
CO2: 26 mmol/L (ref 22–32)
Calcium: 8.9 mg/dL (ref 8.9–10.3)
Chloride: 104 mmol/L (ref 98–111)
Creatinine, Ser: 0.99 mg/dL (ref 0.61–1.24)
GFR, Estimated: >60 mL/min (ref >60)
Glucose, Bld: 119 mg/dL — ABNORMAL HIGH (ref 70–99)
Potassium: 4.3 mmol/L (ref 3.5–5.1)
Sodium: 139 mmol/L (ref 135–145)

## 2023-01-11 MED ORDER — SALINE SPRAY 0.65 % NA SOLN
1.0000 | NASAL | Status: DC | PRN
  Administered 2023-01-12: 1 via NASAL
  Filled 2023-01-11: qty 44

## 2023-01-11 NOTE — Progress Notes (Signed)
Patient arrived to unit via wheelchair. Walked in room independently. On 6L O2 via HFNC. States he uses a CPAP at hs-will notify provider and RT. ST elevation on monitor-will check previus EKGs and get new one if needed. No complaints form patient.  RSV positive- isolation initiated.

## 2023-01-11 NOTE — ED Notes (Signed)
Pt continues to be verbally abusive to staff.

## 2023-01-11 NOTE — ED Notes (Signed)
Pt continues to be verbally abusive to staff. Pt is "not damn fast enough responding to my needs". Pt ambulated to restroom.

## 2023-01-11 NOTE — ED Notes (Signed)
Pt is verbally abusive to staff.

## 2023-01-11 NOTE — Progress Notes (Signed)
Pt stated he used nasal pillows at home with his CPAP.  Pt placed on CPAP with nasal mask. Pt attempted to wear and stated he cannot tolerate the nasal mask and requested nasal pillows. Explained to pt that the hospital doesn't have nasal pillows and advised pt to have his home nasal pillows brought to him for to use while he is in the hospital.

## 2023-01-11 NOTE — ED Notes (Signed)
Pt ambulated around the floor without O2.

## 2023-01-11 NOTE — ED Notes (Signed)
ED TO INPATIENT HANDOFF REPORT  ED Nurse Name and Phone #:  Nicki Reaper [phone]  S Name/Age/Gender Richard [name] 52 y.o. male Room/Bed: 043C/043C  Code Status   Code Status: Full Code  Home/SNF/Other Home Patient oriented to: self, place, time, and situation Is this baseline? Yes   Triage Complete: Triage complete  Chief Complaint Acute hypoxic respiratory failure ([place]) [J96.01]  Triage Note Patient here with complaint of shortness of breath, patient states he called 911 and was told "if you don't go to the hospital you might die" and so he had someone drive him to the hospital. Patient arrives on room air, SpO2 74%. Patient states he wears 4L O2 [place] at baseline. Patient is alert, oriented, speaking in complete sentences, denies chest pain but states he was told my EMS that "his heart is fighting something".  SpO2 88% on 4L O2 [place]. Flow rate increased to 6L/min.   Allergies Allergies  Allergen Reactions   Dilaudid [Hydromorphone Hcl] Nausea Only and Other (See Comments)    Bradycardia and hyperthermia, too   Tape Itching and Dermatitis    Level of Care/Admitting Diagnosis ED Disposition     ED Disposition  Admit   Condition  --   Comment  Hospital Area: [facility] [[id number]]  Level of Care: Telemetry Medical [[id number]]  May admit patient to [facility] or [facility] if equivalent level of care is available:: Yes  Covid Evaluation: Confirmed COVID Negative  Diagnosis: Acute hypoxic respiratory failure [facility]) [[id number]]  Admitting Physician: Campbell Riches [[id number]]  Attending Physician: Johnnye Sima, JEFFREY C [[id number]]  Certification:: I certify this patient will need inpatient services for at least 2 midnights  Estimated Length of Stay: 2          B Medical/Surgery History Past Medical History:  Diagnosis Date   Asthma    Brain bleed ([place])    DVT (deep venous thrombosis) ([place]) 02/19/2015   RLE   GERD (gastroesophageal reflux disease)    History of home  oxygen therapy    Hypertension    Seizures ([place])    last episode 03/2013   Stroke [facility])    Past Surgical History:  Procedure Laterality Date   BRONCHIAL BIOPSY  02/10/2022   Procedure: BRONCHIAL BIOPSIES;  Surgeon: Rigoberto Noel, MD;  Location: [facility] ENDOSCOPY;  Service: Cardiopulmonary;;   BRONCHIAL BRUSHINGS  02/10/2022   Procedure: BRONCHIAL BRUSHINGS;  Surgeon: Rigoberto Noel, MD;  Location: [facility] ENDOSCOPY;  Service: Cardiopulmonary;;   BRONCHIAL NEEDLE ASPIRATION BIOPSY  02/10/2022   Procedure: BRONCHIAL NEEDLE ASPIRATION BIOPSIES;  Surgeon: Rigoberto Noel, MD;  Location: [facility];  Service: Cardiopulmonary;;   BRONCHIAL WASHINGS  02/10/2022   Procedure: BRONCHIAL WASHINGS;  Surgeon: Rigoberto Noel, MD;  Location: [facility];  Service: Cardiopulmonary;;   ENDOBRONCHIAL ULTRASOUND Bilateral 02/10/2022   Procedure: ENDOBRONCHIAL ULTRASOUND;  Surgeon: Rigoberto Noel, MD;  Location: [facility]OS[facility];  Service: Cardiopulmonary;  Laterality: Bilateral;   LEG SURGERY     VIDEO BRONCHOSCOPY  02/10/2022   Procedure: VIDEO BRONCHOSCOPY WITHOUT FLUORO;  Surgeon: Rigoberto Noel, MD;  Location: [facility];  Service: Cardiopulmonary;;     A IV Location/Drains/Wounds [name]/Drains/Airways Status     Active Line/Drains/Airways     Name Placement date Placement time Site Days   Peripheral IV 01/09/23 20 G 1" Right Antecubital 01/09/23  2103  Antecubital  2   Wound / Incision (Open or Dehisced) 12/28/22 Puncture Back Right lung biopsy 12/28/22  1406  Back  14  Intake/Output Last 24 hours  Intake/Output Summary (Last 24 hours) at 01/11/2023 1152 Last data filed at 01/11/2023 0646 Gross per 24 hour  Intake --  Output 2530 ml  Net -2530 ml    Labs/Imaging Results for orders placed or performed during the hospital encounter of 01/09/23 (from the past 48 hour(s))  CBC with Differential     Status: Abnormal   Collection Time: 01/09/23  6:39 PM  Result Value Ref Range   WBC  9.7 4.0 - 10.5 K/uL   RBC 5.08 4.22 - 5.81 MIL/uL   Hemoglobin 16.6 13.0 - 17.0 g/dL   HCT 48.5 39.0 - 52.0 %   MCV 95.5 80.0 - 100.0 fL   MCH 32.7 26.0 - 34.0 pg   MCHC 34.2 30.0 - 36.0 g/dL   RDW 14.9 11.5 - 15.5 %   Platelets 290 150 - 400 K/uL   nRBC 0.0 0.0 - 0.2 %   Neutrophils Relative % 80 %   Neutro Abs 7.8 (H) 1.7 - 7.7 K/uL   Lymphocytes Relative 11 %   Lymphs Abs 1.0 0.7 - 4.0 K/uL   Monocytes Relative 8 %   Monocytes Absolute 0.8 0.1 - 1.0 K/uL   Eosinophils Relative 0 %   Eosinophils Absolute 0.0 0.0 - 0.5 K/uL   Basophils Relative 0 %   Basophils Absolute 0.0 0.0 - 0.1 K/uL   Immature Granulocytes 1 %   Abs Immature Granulocytes 0.05 0.00 - 0.07 K/uL    Comment: Performed at [facility], [address] [place], [place] [address]  Comprehensive metabolic panel     Status: Abnormal   Collection Time: 01/09/23  6:39 PM  Result Value Ref Range   Sodium 140 135 - 145 mmol/L   Potassium 3.7 3.5 - 5.1 mmol/L   Chloride 107 98 - 111 mmol/L   CO2 21 (L) 22 - 32 mmol/L   Glucose, Bld 91 70 - 99 mg/dL    Comment: Glucose reference range applies only to samples taken after fasting for at least 8 hours.   BUN 9 6 - 20 mg/dL   Creatinine, Ser 1.04 0.61 - 1.24 mg/dL   Calcium 9.1 8.9 - 10.3 mg/dL   Total Protein 7.2 6.5 - 8.1 g/dL   Albumin 3.6 3.5 - 5.0 g/dL   AST 18 15 - 41 U/L   ALT 15 0 - 44 U/L   Alkaline Phosphatase 61 38 - 126 U/L   Total Bilirubin 0.5 0.3 - 1.2 mg/dL   GFR, Estimated >60 >60 mL/min    Comment: (NOTE) Calculated using the CKD-EPI Creatinine Equation (2021)    Anion gap 12 5 - 15    Comment: Performed at [facility] [address] [place], [place] [address]  Troponin I (High Sensitivity)     Status: None   Collection Time: 01/09/23  6:39 PM  Result Value Ref Range   Troponin I (High Sensitivity) 15 <18 ng/L    Comment: (NOTE) Elevated high sensitivity troponin I (hsTnI) values and significant  changes across serial  measurements may suggest ACS but many other  chronic and acute conditions are known to elevate hsTnI results.  Refer to the "Links" section for chest pain algorithms and additional  guidance. Performed at [facility], [place] [address] [place], [place] [address]   Troponin I (High Sensitivity)     Status: None   Collection Time: 01/09/23  8:40 PM  Result Value Ref Range   Troponin I (  High Sensitivity) 14 <18 ng/L    Comment: (NOTE) Elevated high sensitivity troponin I (hsTnI) values and significant  changes across serial measurements may suggest ACS but many other  chronic and acute conditions are known to elevate hsTnI results.  Refer to the "Links" section for chest pain algorithms and additional  guidance. Performed at [facility], [place] [address] [place], [place] [address]   Resp panel by RT-PCR (RSV, Flu A&B, Covid) Anterior Nasal Swab     Status: Abnormal   Collection Time: 01/09/23 10:34 PM   Specimen: Anterior Nasal Swab  Result Value Ref Range   SARS Coronavirus 2 by RT PCR NEGATIVE NEGATIVE    Comment: (NOTE) SARS-CoV-2 target nucleic acids are NOT DETECTED.  The SARS-CoV-2 RNA is generally detectable in upper respiratory specimens during the acute phase of infection. The lowest concentration of SARS-CoV-2 viral copies this assay can detect is 138 copies/mL. A negative result does not preclude SARS-Cov-2 infection and should not be used as the sole basis for treatment or other patient management decisions. A negative result may occur with  improper specimen collection/handling, submission of specimen other than nasopharyngeal swab, presence of viral mutation(s) within the areas targeted by this assay, and inadequate number of viral copies(<138 copies/mL). A negative result must be combined with clinical observations, patient history, and epidemiological information. The expected result is Negative.  Fact Sheet for Patients:   EntrepreneurPulse.com.au  Fact Sheet for Healthcare Providers:  IncredibleEmployment.be  This test is no t yet approved or cleared by the [place] FDA and  has been authorized for detection and/or diagnosis of SARS-CoV-2 by FDA under an Emergency Use Authorization (EUA). This EUA will remain  in effect (meaning this test can be used) for the duration of the COVID-19 declaration under Section 564(b)(1) of the Act, 21 U.S.C.section 360bbb-3(b)(1), unless the authorization is terminated  or revoked sooner.       Influenza A by PCR NEGATIVE NEGATIVE   Influenza B by PCR NEGATIVE NEGATIVE    Comment: (NOTE) The Xpert Xpress SARS-CoV-2/FLU/RSV plus assay is intended as an aid in the diagnosis of influenza from Nasopharyngeal swab specimens and should not be used as a sole basis for treatment. Nasal washings and aspirates are unacceptable for Xpert Xpress SARS-CoV-2/FLU/RSV testing.  Fact Sheet for Patients: EntrepreneurPulse.com.au  Fact Sheet for Healthcare Providers: IncredibleEmployment.be  This test is not yet approved or cleared by the [place] FDA and has been authorized for detection and/or diagnosis of SARS-CoV-2 by FDA under an Emergency Use Authorization (EUA). This EUA will remain in effect (meaning this test can be used) for the duration of the COVID-19 declaration under Section 564(b)(1) of the Act, 21 U.S.C. section 360bbb-3(b)(1), unless the authorization is terminated or revoked.     Resp Syncytial Virus by PCR POSITIVE (A) NEGATIVE    Comment: (NOTE) Fact Sheet for Patients: EntrepreneurPulse.com.au  Fact Sheet for Healthcare Providers: IncredibleEmployment.be  This test is not yet approved or cleared by the [place] FDA and has been authorized for detection and/or diagnosis of SARS-CoV-2 by FDA under an Emergency Use Authorization  (EUA). This EUA will remain in effect (meaning this test can be used) for the duration of the COVID-19 declaration under Section 564(b)(1) of the Act, 21 U.S.C. section 360bbb-3(b)(1), unless the authorization is terminated or revoked.  Performed at [facility], [place] [address], [place], [place] [address]   Brain natriuretic peptide     Status: Abnormal   Collection Time: 01/09/23  10:40 PM  Result Value Ref Range   B Natriuretic Peptide 179.8 (H) 0.0 - 100.0 pg/mL    Comment: Performed at [facility] [address] [place], [place] [address]  I-Stat venous blood gas, ED     Status: Abnormal   Collection Time: 01/09/23 10:51 PM  Result Value Ref Range   pH, Ven 7.408 7.25 - 7.43   pCO2, Ven 34.8 (L) 44 - 60 mmHg   pO2, Ven 62 (H) 32 - 45 mmHg   Bicarbonate 21.9 20.0 - 28.0 mmol/L   TCO2 23 22 - 32 mmol/L   O2 Saturation 92 %   Acid-base deficit 2.0 0.0 - 2.0 mmol/L   Sodium 144 135 - 145 mmol/L   Potassium 4.8 3.5 - 5.1 mmol/L   Calcium, Ion 1.02 (L) 1.15 - 1.40 mmol/L   HCT 45.0 39.0 - 52.0 %   Hemoglobin 15.3 13.0 - 17.0 g/dL   Sample type VENOUS   Basic metabolic panel     Status: Abnormal   Collection Time: 01/11/23  3:12 AM  Result Value Ref Range   Sodium 139 135 - 145 mmol/L   Potassium 4.3 3.5 - 5.1 mmol/L    Comment: HEMOLYSIS AT THIS LEVEL MAY AFFECT RESULT   Chloride 104 98 - 111 mmol/L   CO2 26 22 - 32 mmol/L   Glucose, Bld 119 (H) 70 - 99 mg/dL    Comment: Glucose reference range applies only to samples taken after fasting for at least 8 hours.   BUN 11 6 - 20 mg/dL   Creatinine, Ser 0.99 0.61 - 1.24 mg/dL   Calcium 8.9 8.9 - 10.3 mg/dL   GFR, Estimated >60 >60 mL/min    Comment: (NOTE) Calculated using the CKD-EPI Creatinine Equation (2021)    Anion gap 9 5 - 15    Comment: Performed at [facility] [address] [place], [place] [address]  CBC     Status: Abnormal   Collection Time: 01/11/23  3:12 AM  Result Value Ref Range    WBC 11.6 (H) 4.0 - 10.5 K/uL   RBC 4.73 4.22 - 5.81 MIL/uL   Hemoglobin 15.0 13.0 - 17.0 g/dL   HCT 45.9 39.0 - 52.0 %   MCV 97.0 80.0 - 100.0 fL   MCH 31.7 26.0 - 34.0 pg   MCHC 32.7 30.0 - 36.0 g/dL   RDW 14.7 11.5 - 15.5 %   Platelets 263 150 - 400 K/uL   nRBC 0.0 0.0 - 0.2 %    Comment: Performed at [facility], [place] [address], [place], [place] [address]   DG Knee 1-2 Views Left  Result Date: 01/11/2023 CLINICAL DATA:  Left lateral knee pain.  No known trauma. EXAM: LEFT KNEE - 1-2 VIEW COMPARISON:  None Available. FINDINGS: No visible joint effusion. No degenerative change of the weight-bearing compartment or patellofemoral joint. Slightly high positioning of the patella incidentally noted. Benign appearing cortical thickening of the anterior cortex of the proximal tibial diaphysis, not significant. IMPRESSION: 1. No acute finding to explain the clinical symptoms. No visible degenerative changes. 2. Mild patella alta. Electronically Signed   By: Nelson Chimes M.D.   On: 01/11/2023 08:21   DG Chest 1 View  Result Date: 01/09/2023 CLINICAL DATA:  Cough and shortness of breath starting a few days ago with worsening headaches and lightheadedness and weakness EXAM: CHEST  1 VIEW COMPARISON:  12/28/2022 FINDINGS: Similar appearance of the right hilar mass and  architectural distortion in the right upper lung. Stable cardiomediastinal silhouette. No new focal consolidation, pleural effusion, or pneumothorax. No acute osseous abnormality. IMPRESSION: No significant change from 12/28/2022. Right hilar mass and architectural distortion previously evaluated with CT 12/25/2022. Electronically Signed   By: Placido Sou M.D.   On: 01/09/2023 19:19   ECHOCARDIOGRAM COMPLETE  Result Date: 12/26/2022    ECHOCARDIOGRAM REPORT   Patient Name:   Richard [name] Date of Exam: 12/26/2022 Medical Rec #:  [id number]    Height:       69.0 in Accession #:    [id number]   Weight:       225.0 lb Date of Birth:   1971-10-29    BSA:          2.172 m Patient Age:    86 years     BP:           114/76 mmHg Patient Gender: M            HR:           92 bpm. Exam Location:  Inpatient Procedure: 2D Echo, Cardiac Doppler and Color Doppler Indications:    Dyspnea  History:        Patient has prior history of Echocardiogram examinations, most                 recent 02/04/2022. COPD; Risk Factors:Hypertension and Current                 Smoker. Lung cancer. Hx TIA.  Sonographer:    Clayton Lefort RDCS (AE) Referring Phys: [place]  1. Left ventricular ejection fraction, by estimation, is 60 to 65%. The left ventricle has normal function. The left ventricle has no regional wall motion abnormalities. There is moderate concentric left ventricular hypertrophy. Left ventricular diastolic parameters are consistent with Grade I diastolic dysfunction (impaired relaxation).  2. Right ventricular systolic function is normal. The right ventricular size is normal.  3. The mitral valve is normal in structure. No evidence of mitral valve regurgitation. No evidence of mitral stenosis.  4. The aortic valve has an indeterminant number of cusps. Aortic valve regurgitation is mild. No aortic stenosis is present.  5. The inferior vena cava is normal in size with greater than 50% respiratory variability, suggesting right atrial pressure of 3 mmHg. Comparison(s): No significant change from prior study. FINDINGS  Left Ventricle: Left ventricular ejection fraction, by estimation, is 60 to 65%. The left ventricle has normal function. The left ventricle has no regional wall motion abnormalities. The left ventricular internal cavity size was normal in size. There is  moderate concentric left ventricular hypertrophy. Left ventricular diastolic parameters are consistent with Grade I diastolic dysfunction (impaired relaxation). Right Ventricle: The right ventricular size is normal. Right ventricular systolic function is normal. Left Atrium: Left  atrial size was normal in size. Right Atrium: Right atrial size was normal in size. Pericardium: Trivial pericardial effusion is present. Mitral Valve: The mitral valve is normal in structure. No evidence of mitral valve regurgitation. No evidence of mitral valve stenosis. Tricuspid Valve: The tricuspid valve is normal in structure. Tricuspid valve regurgitation is trivial. No evidence of tricuspid stenosis. Aortic Valve: The aortic valve has an indeterminant number of cusps. Aortic valve regurgitation is mild. Aortic regurgitation PHT measures 409 msec. No aortic stenosis is present. Aortic valve mean gradient measures 2.0 mmHg. Aortic valve peak gradient measures 3.8 mmHg. Aortic valve area, by VTI measures 4.38 cm. Pulmonic Valve: The  pulmonic valve was normal in structure. Pulmonic valve regurgitation is not visualized. No evidence of pulmonic stenosis. Aorta: The aortic root is normal in size and structure. Venous: The inferior vena cava is normal in size with greater than 50% respiratory variability, suggesting right atrial pressure of 3 mmHg. IAS/Shunts: No atrial level shunt detected by color flow Doppler.  LEFT VENTRICLE PLAX 2D LVIDd:         4.30 cm   Diastology LVIDs:         3.00 cm   LV e' medial:    4.79 cm/s LV PW:         2.00 cm   LV E/e' medial:  13.5 LV IVS:        1.40 cm   LV e' lateral:   7.72 cm/s LVOT diam:     2.30 cm   LV E/e' lateral: 8.4 LV SV:         71 LV SV Index:   33 LVOT Area:     4.15 cm  RIGHT VENTRICLE             IVC RV Basal diam:  3.30 cm     IVC diam: 1.50 cm RV S prime:     16.50 cm/s TAPSE (M-mode): 1.9 cm LEFT ATRIUM             Index        RIGHT ATRIUM           Index LA diam:        2.80 cm 1.29 cm/m   RA Area:     13.40 cm LA Vol (A2C):   35.6 ml 16.39 ml/m  RA Volume:   32.70 ml  15.05 ml/m LA Vol (A4C):   28.3 ml 13.03 ml/m LA Biplane Vol: 32.2 ml 14.82 ml/m  AORTIC VALVE AV Area (Vmax):    4.26 cm AV Area (Vmean):   4.31 cm AV Area (VTI):     4.38 cm AV  Vmax:           97.00 cm/s AV Vmean:          66.700 cm/s AV VTI:            0.163 m AV Peak Grad:      3.8 mmHg AV Mean Grad:      2.0 mmHg LVOT Vmax:         99.50 cm/s LVOT Vmean:        69.200 cm/s LVOT VTI:          0.172 m LVOT/AV VTI ratio: 1.06 AI PHT:            409 msec  AORTA Ao Root diam: 3.90 cm MITRAL VALVE MV Area (PHT): 3.21 cm    SHUNTS MV Decel Time: 236 msec    Systemic VTI:  0.17 m MV E velocity: 64.60 cm/s  Systemic Diam: 2.30 cm MV A velocity: 98.50 cm/s MV E/A ratio:  0.66 Kirk Ruths MD Electronically signed by Kirk Ruths MD Signature Date/Time: 12/26/2022/11:34:31 AM    Final     Pending Labs Unresulted Labs (From admission, onward)    None       Vitals/Pain Today's Vitals   01/11/23 0500 01/11/23 0600 01/11/23 0827 01/11/23 0900  BP: (!) 132/109 (!) 157/105  (!) 156/101  Pulse: 86 88  85  Resp: 19 17  18   Temp:   97.7 F (36.5 C)   TempSrc:   Oral   SpO2: 93% 94%  91%  PainSc:        Isolation Precautions No active isolations  Medications Medications  enoxaparin (LOVENOX) injection 40 mg (40 mg Subcutaneous Not Given 01/11/23 1114)  acetaminophen (TYLENOL) tablet 650 mg (has no administration in time range)    Or  acetaminophen (TYLENOL) suppository 650 mg (has no administration in time range)  ipratropium-albuterol (DUONEB) 0.5-2.5 (3) MG/3ML nebulizer solution 3 mL (3 mLs Nebulization Given 01/11/23 0837)  fluticasone furoate-vilanterol (BREO ELLIPTA) 200-25 MCG/ACT 1 puff (1 puff Inhalation Not Given 01/11/23 0925)  levETIRAcetam (KEPPRA) tablet 500 mg (500 mg Oral Not Given 01/11/23 1114)  cloNIDine (CATAPRES) tablet 0.3 mg (0.3 mg Oral Not Given 01/11/23 1114)  predniSONE (DELTASONE) tablet 40 mg (40 mg Oral Given 01/11/23 0836)  azithromycin (ZITHROMAX) tablet 500 mg (500 mg Oral Not Given 01/11/23 1114)  losartan (COZAAR) tablet 100 mg (100 mg Oral Not Given 01/11/23 1114)  ipratropium-albuterol (DUONEB) 0.5-2.5 (3) MG/3ML nebulizer solution 3 mL  (3 mLs Nebulization Given 01/09/23 2305)  magnesium sulfate IVPB 2 g 50 mL (0 g Intravenous Stopped 01/09/23 2331)  methylPREDNISolone sodium succinate (SOLU-MEDROL) 125 mg/2 mL injection 125 mg (125 mg Intravenous Given 01/09/23 2240)  ipratropium-albuterol (DUONEB) 0.5-2.5 (3) MG/3ML nebulizer solution 3 mL (3 mLs Nebulization Given 01/09/23 2250)  ipratropium-albuterol (DUONEB) 0.5-2.5 (3) MG/3ML nebulizer solution 3 mL (3 mLs Nebulization Given 01/09/23 2245)  albuterol (PROVENTIL) (2.5 MG/3ML) 0.083% nebulizer solution (10 mg/hr Nebulization Given 01/10/23 0002)    Mobility walks     Focused Assessments Pulmonary Assessment Handoff:  Lung sounds: Bilateral Breath Sounds: Diminished O2 Device: Nasal Cannula O2 Flow Rate (L/min): 10 L/min    R Recommendations: See Admitting Provider Note  Report given to:   Additional Notes: Aox4, amubaltes, takes PO meds well,  ON high flow [place] at 8L humidified,

## 2023-01-11 NOTE — ED Notes (Signed)
ED TO INPATIENT HANDOFF REPORT  ED Nurse Name and Phone #:  Nicki Reaper [phone]  S Name/Age/Gender Richard [name] 52 y.o. male Room/Bed: 043C/043C  Code Status   Code Status: Full Code  Home/SNF/Other Home Patient oriented to: self, place, time, and situation Is this baseline? Yes   Triage Complete: Triage complete  Chief Complaint Acute hypoxic respiratory failure ([place]) [J96.01]  Triage Note Patient here with complaint of shortness of breath, patient states he called 911 and was told "if you don't go to the hospital you might die" and so he had someone drive him to the hospital. Patient arrives on room air, SpO2 74%. Patient states he wears 4L O2 [place] at baseline. Patient is alert, oriented, speaking in complete sentences, denies chest pain but states he was told my EMS that "his heart is fighting something".  SpO2 88% on 4L O2 [place]. Flow rate increased to 6L/min.   Allergies Allergies  Allergen Reactions   Dilaudid [Hydromorphone Hcl] Nausea Only and Other (See Comments)    Bradycardia and hyperthermia, too   Tape Itching and Dermatitis    Level of Care/Admitting Diagnosis ED Disposition     ED Disposition  Admit   Condition  --   Comment  Hospital Area: [facility] [[id number]]  Level of Care: Telemetry Medical [[id number]]  May admit patient to [facility] or [facility] if equivalent level of care is available:: Yes  Covid Evaluation: Confirmed COVID Negative  Diagnosis: Acute hypoxic respiratory failure [facility]) [[id number]]  Admitting Physician: Campbell Riches [[id number]]  Attending Physician: Johnnye Sima, JEFFREY C [[id number]]  Certification:: I certify this patient will need inpatient services for at least 2 midnights  Estimated Length of Stay: 2          B Medical/Surgery History Past Medical History:  Diagnosis Date   Asthma    Brain bleed ([employer])    DVT (deep venous thrombosis) ([place]) 02/19/2015   RLE   GERD (gastroesophageal reflux disease)    History of home  oxygen therapy    Hypertension    Seizures ([place])    last episode 03/2013   Stroke [facility])    Past Surgical History:  Procedure Laterality Date   BRONCHIAL BIOPSY  02/10/2022   Procedure: BRONCHIAL BIOPSIES;  Surgeon: Rigoberto Noel, MD;  Location: [facility] ENDOSCOPY;  Service: Cardiopulmonary;;   BRONCHIAL BRUSHINGS  02/10/2022   Procedure: BRONCHIAL BRUSHINGS;  Surgeon: Rigoberto Noel, MD;  Location: [facility] ENDOSCOPY;  Service: Cardiopulmonary;;   BRONCHIAL NEEDLE ASPIRATION BIOPSY  02/10/2022   Procedure: BRONCHIAL NEEDLE ASPIRATION BIOPSIES;  Surgeon: Rigoberto Noel, MD;  [facility];  Service: Cardiopulmonary;;   BRONCHIAL WASHINGS  02/10/2022   Procedure: BRONCHIAL WASHINGS;  Surgeon: Rigoberto Noel, MD;  Location: [facility];  Service: Cardiopulmonary;;   ENDOBRONCHIAL ULTRASOUND Bilateral 02/10/2022   Procedure: ENDOBRONCHIAL ULTRASOUND;  Surgeon: Rigoberto Noel, MD;  Location: [facility]OS[facility];  Service: Cardiopulmonary;  Laterality: Bilateral;   LEG SURGERY     VIDEO BRONCHOSCOPY  02/10/2022   Procedure: VIDEO BRONCHOSCOPY WITHOUT FLUORO;  Surgeon: Rigoberto Noel, MD;  Location: [facility];  Service: Cardiopulmonary;;     A IV Location/Drains/Wounds [name]/Drains/Airways Status     Active Line/Drains/Airways     Name Placement date Placement time Site Days   Peripheral IV 01/09/23 20 G 1" Right Antecubital 01/09/23  2103  Antecubital  2   Wound / Incision (Open or Dehisced) 12/28/22 Puncture Back Right lung biopsy 12/28/22  1406  Back  14  Intake/Output Last 24 hours  Intake/Output Summary (Last 24 hours) at 01/11/2023 1703 Last data filed at 01/11/2023 0646 Gross per 24 hour  Intake --  Output 2530 ml  Net -2530 ml    Labs/Imaging Results for orders placed or performed during the hospital encounter of 01/09/23 (from the past 48 hour(s))  CBC with Differential     Status: Abnormal   Collection Time: 01/09/23  6:39 PM  Result Value Ref Range   WBC  9.7 4.0 - 10.5 K/uL   RBC 5.08 4.22 - 5.81 MIL/uL   Hemoglobin 16.6 13.0 - 17.0 g/dL   HCT 48.5 39.0 - 52.0 %   MCV 95.5 80.0 - 100.0 fL   MCH 32.7 26.0 - 34.0 pg   MCHC 34.2 30.0 - 36.0 g/dL   RDW 14.9 11.5 - 15.5 %   Platelets 290 150 - 400 K/uL   nRBC 0.0 0.0 - 0.2 %   Neutrophils Relative % 80 %   Neutro Abs 7.8 (H) 1.7 - 7.7 K/uL   Lymphocytes Relative 11 %   Lymphs Abs 1.0 0.7 - 4.0 K/uL   Monocytes Relative 8 %   Monocytes Absolute 0.8 0.1 - 1.0 K/uL   Eosinophils Relative 0 %   Eosinophils Absolute 0.0 0.0 - 0.5 K/uL   Basophils Relative 0 %   Basophils Absolute 0.0 0.0 - 0.1 K/uL   Immature Granulocytes 1 %   Abs Immature Granulocytes 0.05 0.00 - 0.07 K/uL    Comment: Performed at [facility], [address], [place], [place] [address]  Comprehensive metabolic panel     Status: Abnormal   Collection Time: 01/09/23  6:39 PM  Result Value Ref Range   Sodium 140 135 - 145 mmol/L   Potassium 3.7 3.5 - 5.1 mmol/L   Chloride 107 98 - 111 mmol/L   CO2 21 (L) 22 - 32 mmol/L   Glucose, Bld 91 70 - 99 mg/dL    Comment: Glucose reference range applies only to samples taken after fasting for at least 8 hours.   BUN 9 6 - 20 mg/dL   Creatinine, Ser 1.04 0.61 - 1.24 mg/dL   Calcium 9.1 8.9 - 10.3 mg/dL   Total Protein 7.2 6.5 - 8.1 g/dL   Albumin 3.6 3.5 - 5.0 g/dL   AST 18 15 - 41 U/L   ALT 15 0 - 44 U/L   Alkaline Phosphatase 61 38 - 126 U/L   Total Bilirubin 0.5 0.3 - 1.2 mg/dL   GFR, Estimated >60 >60 mL/min    Comment: (NOTE) Calculated using the CKD-EPI Creatinine Equation (2021)    Anion gap 12 5 - 15    Comment: Performed at [facility] [address], [place], [place] [address]  Troponin I (High Sensitivity)     Status: None   Collection Time: 01/09/23  6:39 PM  Result Value Ref Range   Troponin I (High Sensitivity) 15 <18 ng/L    Comment: (NOTE) Elevated high sensitivity troponin I (hsTnI) values and significant  changes across serial  measurements may suggest ACS but many other  chronic and acute conditions are known to elevate hsTnI results.  Refer to the "Links" section for chest pain algorithms and additional  guidance. Performed at [facility], [place] [address], [place], [place] [address]   Troponin I (High Sensitivity)     Status: None   Collection Time: 01/09/23  8:40 PM  Result Value Ref Range   Troponin I (  High Sensitivity) 14 <18 ng/L    Comment: (NOTE) Elevated high sensitivity troponin I (hsTnI) values and significant  changes across serial measurements may suggest ACS but many other  chronic and acute conditions are known to elevate hsTnI results.  Refer to the "Links" section for chest pain algorithms and additional  guidance. Performed at [facility], [place] [address], [place], [place] [address]   Resp panel by RT-PCR (RSV, Flu A&B, Covid) Anterior Nasal Swab     Status: Abnormal   Collection Time: 01/09/23 10:34 PM   Specimen: Anterior Nasal Swab  Result Value Ref Range   SARS Coronavirus 2 by RT PCR NEGATIVE NEGATIVE    Comment: (NOTE) SARS-CoV-2 target nucleic acids are NOT DETECTED.  The SARS-CoV-2 RNA is generally detectable in upper respiratory specimens during the acute phase of infection. The lowest concentration of SARS-CoV-2 viral copies this assay can detect is 138 copies/mL. A negative result does not preclude SARS-Cov-2 infection and should not be used as the sole basis for treatment or other patient management decisions. A negative result may occur with  improper specimen collection/handling, submission of specimen other than nasopharyngeal swab, presence of viral mutation(s) within the areas targeted by this assay, and inadequate number of viral copies(<138 copies/mL). A negative result must be combined with clinical observations, patient history, and epidemiological information. The expected result is Negative.  Fact Sheet for Patients:   EntrepreneurPulse.com.au  Fact Sheet for Healthcare Providers:  IncredibleEmployment.be  This test is no t yet approved or cleared by the [place] FDA and  has been authorized for detection and/or diagnosis of SARS-CoV-2 by FDA under an Emergency Use Authorization (EUA). This EUA will remain  in effect (meaning this test can be used) for the duration of the COVID-19 declaration under Section 564(b)(1) of the Act, 21 U.S.C.section 360bbb-3(b)(1), unless the authorization is terminated  or revoked sooner.       Influenza A by PCR NEGATIVE NEGATIVE   Influenza B by PCR NEGATIVE NEGATIVE    Comment: (NOTE) The Xpert Xpress SARS-CoV-2/FLU/RSV plus assay is intended as an aid in the diagnosis of influenza from Nasopharyngeal swab specimens and should not be used as a sole basis for treatment. Nasal washings and aspirates are unacceptable for Xpert Xpress SARS-CoV-2/FLU/RSV testing.  Fact Sheet for Patients: EntrepreneurPulse.com.au  Fact Sheet for Healthcare Providers: IncredibleEmployment.be  This test is not yet approved or cleared by the [place] FDA and has been authorized for detection and/or diagnosis of SARS-CoV-2 by FDA under an Emergency Use Authorization (EUA). This EUA will remain in effect (meaning this test can be used) for the duration of the COVID-19 declaration under Section 564(b)(1) of the Act, 21 U.S.C. section 360bbb-3(b)(1), unless the authorization is terminated or revoked.     Resp Syncytial Virus by PCR POSITIVE (A) NEGATIVE    Comment: (NOTE) Fact Sheet for Patients: EntrepreneurPulse.com.au  Fact Sheet for Healthcare Providers: IncredibleEmployment.be  This test is not yet approved or cleared by the [place] FDA and has been authorized for detection and/or diagnosis of SARS-CoV-2 by FDA under an Emergency Use Authorization  (EUA). This EUA will remain in effect (meaning this test can be used) for the duration of the COVID-19 declaration under Section 564(b)(1) of the Act, 21 U.S.C. section 360bbb-3(b)(1), unless the authorization is terminated or revoked.  Performed at [facility], [place] [address] [place], [place] [address]   Brain natriuretic peptide     Status: Abnormal   Collection Time: 01/09/23  10:40 PM  Result Value Ref Range   B Natriuretic Peptide 179.8 (H) 0.0 - 100.0 pg/mL    Comment: Performed at [facility] [address] [place], [place] [address]  I-Stat venous blood gas, ED     Status: Abnormal   Collection Time: 01/09/23 10:51 PM  Result Value Ref Range   pH, Ven 7.408 7.25 - 7.43   pCO2, Ven 34.8 (L) 44 - 60 mmHg   pO2, Ven 62 (H) 32 - 45 mmHg   Bicarbonate 21.9 20.0 - 28.0 mmol/L   TCO2 23 22 - 32 mmol/L   O2 Saturation 92 %   Acid-base deficit 2.0 0.0 - 2.0 mmol/L   Sodium 144 135 - 145 mmol/L   Potassium 4.8 3.5 - 5.1 mmol/L   Calcium, Ion 1.02 (L) 1.15 - 1.40 mmol/L   HCT 45.0 39.0 - 52.0 %   Hemoglobin 15.3 13.0 - 17.0 g/dL   Sample type VENOUS   Basic metabolic panel     Status: Abnormal   Collection Time: 01/11/23  3:12 AM  Result Value Ref Range   Sodium 139 135 - 145 mmol/L   Potassium 4.3 3.5 - 5.1 mmol/L    Comment: HEMOLYSIS AT THIS LEVEL MAY AFFECT RESULT   Chloride 104 98 - 111 mmol/L   CO2 26 22 - 32 mmol/L   Glucose, Bld 119 (H) 70 - 99 mg/dL    Comment: Glucose reference range applies only to samples taken after fasting for at least 8 hours.   BUN 11 6 - 20 mg/dL   Creatinine, Ser 0.99 0.61 - 1.24 mg/dL   Calcium 8.9 8.9 - 10.3 mg/dL   GFR, Estimated >60 >60 mL/min    Comment: (NOTE) Calculated using the CKD-EPI Creatinine Equation (2021)    Anion gap 9 5 - 15    Comment: Performed at [facility] [address], [place], [place] [address]  CBC     Status: Abnormal   Collection Time: 01/11/23  3:12 AM  Result Value Ref Range    WBC 11.6 (H) 4.0 - 10.5 K/uL   RBC 4.73 4.22 - 5.81 MIL/uL   Hemoglobin 15.0 13.0 - 17.0 g/dL   HCT 45.9 39.0 - 52.0 %   MCV 97.0 80.0 - 100.0 fL   MCH 31.7 26.0 - 34.0 pg   MCHC 32.7 30.0 - 36.0 g/dL   RDW 14.7 11.5 - 15.5 %   Platelets 263 150 - 400 K/uL   nRBC 0.0 0.0 - 0.2 %    Comment: Performed at [facility], [place] [address], [place], [place] [address]   DG Knee 1-2 Views Left  Result Date: 01/11/2023 CLINICAL DATA:  Left lateral knee pain.  No known trauma. EXAM: LEFT KNEE - 1-2 VIEW COMPARISON:  None Available. FINDINGS: No visible joint effusion. No degenerative change of the weight-bearing compartment or patellofemoral joint. Slightly high positioning of the patella incidentally noted. Benign appearing cortical thickening of the anterior cortex of the proximal tibial diaphysis, not significant. IMPRESSION: 1. No acute finding to explain the clinical symptoms. No visible degenerative changes. 2. Mild patella alta. Electronically Signed   By: Nelson Chimes M.D.   On: 01/11/2023 08:21   DG Chest 1 View  Result Date: 01/09/2023 CLINICAL DATA:  Cough and shortness of breath starting a few days ago with worsening headaches and lightheadedness and weakness EXAM: CHEST  1 VIEW COMPARISON:  12/28/2022 FINDINGS: Similar appearance of the right hilar mass and  architectural distortion in the right upper lung. Stable cardiomediastinal silhouette. No new focal consolidation, pleural effusion, or pneumothorax. No acute osseous abnormality. IMPRESSION: No significant change from 12/28/2022. Right hilar mass and architectural distortion previously evaluated with CT 12/25/2022. Electronically Signed   By: Placido Sou M.D.   On: 01/09/2023 19:19   ECHOCARDIOGRAM COMPLETE  Result Date: 12/26/2022    ECHOCARDIOGRAM REPORT   Patient Name:   Imre Vecchione Date of Exam: 12/26/2022 Medical Rec #:  [id number]    Height:       69.0 in Accession #:    [id number]   Weight:       225.0 lb Date of Birth:   08/05/1971    BSA:          2.172 m Patient Age:    59 years     BP:           114/76 mmHg Patient Gender: M            HR:           92 bpm. Exam Location:  Inpatient Procedure: 2D Echo, Cardiac Doppler and Color Doppler Indications:    Dyspnea  History:        Patient has prior history of Echocardiogram examinations, most                 recent 02/04/2022. COPD; Risk Factors:Hypertension and Current                 Smoker. Lung cancer. Hx TIA.  Sonographer:    Clayton Lefort RDCS (AE) Referring Phys: [place]  1. Left ventricular ejection fraction, by estimation, is 60 to 65%. The left ventricle has normal function. The left ventricle has no regional wall motion abnormalities. There is moderate concentric left ventricular hypertrophy. Left ventricular diastolic parameters are consistent with Grade I diastolic dysfunction (impaired relaxation).  2. Right ventricular systolic function is normal. The right ventricular size is normal.  3. The mitral valve is normal in structure. No evidence of mitral valve regurgitation. No evidence of mitral stenosis.  4. The aortic valve has an indeterminant number of cusps. Aortic valve regurgitation is mild. No aortic stenosis is present.  5. The inferior vena cava is normal in size with greater than 50% respiratory variability, suggesting right atrial pressure of 3 mmHg. Comparison(s): No significant change from prior study. FINDINGS  Left Ventricle: Left ventricular ejection fraction, by estimation, is 60 to 65%. The left ventricle has normal function. The left ventricle has no regional wall motion abnormalities. The left ventricular internal cavity size was normal in size. There is  moderate concentric left ventricular hypertrophy. Left ventricular diastolic parameters are consistent with Grade I diastolic dysfunction (impaired relaxation). Right Ventricle: The right ventricular size is normal. Right ventricular systolic function is normal. Left Atrium: Left  atrial size was normal in size. Right Atrium: Right atrial size was normal in size. Pericardium: Trivial pericardial effusion is present. Mitral Valve: The mitral valve is normal in structure. No evidence of mitral valve regurgitation. No evidence of mitral valve stenosis. Tricuspid Valve: The tricuspid valve is normal in structure. Tricuspid valve regurgitation is trivial. No evidence of tricuspid stenosis. Aortic Valve: The aortic valve has an indeterminant number of cusps. Aortic valve regurgitation is mild. Aortic regurgitation PHT measures 409 msec. No aortic stenosis is present. Aortic valve mean gradient measures 2.0 mmHg. Aortic valve peak gradient measures 3.8 mmHg. Aortic valve area, by VTI measures 4.38 cm. Pulmonic Valve: The  pulmonic valve was normal in structure. Pulmonic valve regurgitation is not visualized. No evidence of pulmonic stenosis. Aorta: The aortic root is normal in size and structure. Venous: The inferior vena cava is normal in size with greater than 50% respiratory variability, suggesting right atrial pressure of 3 mmHg. IAS/Shunts: No atrial level shunt detected by color flow Doppler.  LEFT VENTRICLE PLAX 2D LVIDd:         4.30 cm   Diastology LVIDs:         3.00 cm   LV e' medial:    4.79 cm/s LV PW:         2.00 cm   LV E/e' medial:  13.5 LV IVS:        1.40 cm   LV e' lateral:   7.72 cm/s LVOT diam:     2.30 cm   LV E/e' lateral: 8.4 LV SV:         71 LV SV Index:   33 LVOT Area:     4.15 cm  RIGHT VENTRICLE             IVC RV Basal diam:  3.30 cm     IVC diam: 1.50 cm RV S prime:     16.50 cm/s TAPSE (M-mode): 1.9 cm LEFT ATRIUM             Index        RIGHT ATRIUM           Index LA diam:        2.80 cm 1.29 cm/m   RA Area:     13.40 cm LA Vol (A2C):   35.6 ml 16.39 ml/m  RA Volume:   32.70 ml  15.05 ml/m LA Vol (A4C):   28.3 ml 13.03 ml/m LA Biplane Vol: 32.2 ml 14.82 ml/m  AORTIC VALVE AV Area (Vmax):    4.26 cm AV Area (Vmean):   4.31 cm AV Area (VTI):     4.38 cm AV  Vmax:           97.00 cm/s AV Vmean:          66.700 cm/s AV VTI:            0.163 m AV Peak Grad:      3.8 mmHg AV Mean Grad:      2.0 mmHg LVOT Vmax:         99.50 cm/s LVOT Vmean:        69.200 cm/s LVOT VTI:          0.172 m LVOT/AV VTI ratio: 1.06 AI PHT:            409 msec  AORTA Ao Root diam: 3.90 cm MITRAL VALVE MV Area (PHT): 3.21 cm    SHUNTS MV Decel Time: 236 msec    Systemic VTI:  0.17 m MV E velocity: 64.60 cm/s  Systemic Diam: 2.30 cm MV A velocity: 98.50 cm/s MV E/A ratio:  0.66 Kirk Ruths MD Electronically signed by Kirk Ruths MD Signature Date/Time: 12/26/2022/11:34:31 AM    Final     Pending Labs Unresulted Labs (From admission, onward)     Start     Ordered   01/12/23 7846  Basic metabolic panel  Tomorrow morning,   R        01/11/23 1203   01/12/23 0500  CBC  Tomorrow morning,   R        01/11/23 1203  Vitals/Pain Today's Vitals   01/11/23 0500 01/11/23 0600 01/11/23 0827 01/11/23 0900  BP: (!) 132/109 (!) 157/105  (!) 156/101  Pulse: 86 88  85  Resp: 19 17  18   Temp:   97.7 F (36.5 C)   TempSrc:   Oral   SpO2: 93% 94%  91%  PainSc:        Isolation Precautions No active isolations  Medications Medications  enoxaparin (LOVENOX) injection 40 mg (40 mg Subcutaneous Not Given 01/11/23 1114)  acetaminophen (TYLENOL) tablet 650 mg (has no administration in time range)    Or  acetaminophen (TYLENOL) suppository 650 mg (has no administration in time range)  ipratropium-albuterol (DUONEB) 0.5-2.5 (3) MG/3ML nebulizer solution 3 mL (3 mLs Nebulization Given 01/11/23 1505)  fluticasone furoate-vilanterol (BREO ELLIPTA) 200-25 MCG/ACT 1 puff (1 puff Inhalation Not Given 01/11/23 0925)  levETIRAcetam (KEPPRA) tablet 500 mg (500 mg Oral Not Given 01/11/23 1114)  cloNIDine (CATAPRES) tablet 0.3 mg (0.3 mg Oral Not Given 01/11/23 1114)  predniSONE (DELTASONE) tablet 40 mg (40 mg Oral Given 01/11/23 0836)  azithromycin (ZITHROMAX) tablet 500 mg (500 mg  Oral Not Given 01/11/23 1114)  losartan (COZAAR) tablet 100 mg (100 mg Oral Not Given 01/11/23 1114)  ipratropium-albuterol (DUONEB) 0.5-2.5 (3) MG/3ML nebulizer solution 3 mL (3 mLs Nebulization Given 01/09/23 2305)  magnesium sulfate IVPB 2 g 50 mL (0 g Intravenous Stopped 01/09/23 2331)  methylPREDNISolone sodium succinate (SOLU-MEDROL) 125 mg/2 mL injection 125 mg (125 mg Intravenous Given 01/09/23 2240)  ipratropium-albuterol (DUONEB) 0.5-2.5 (3) MG/3ML nebulizer solution 3 mL (3 mLs Nebulization Given 01/09/23 2250)  ipratropium-albuterol (DUONEB) 0.5-2.5 (3) MG/3ML nebulizer solution 3 mL (3 mLs Nebulization Given 01/09/23 2245)  albuterol (PROVENTIL) (2.5 MG/3ML) 0.083% nebulizer solution (10 mg/hr Nebulization Given 01/10/23 0002)    Mobility walks     Focused Assessments Pulmonary Assessment Handoff:  Lung sounds: Bilateral Breath Sounds: Diminished O2 Device: Nasal Cannula O2 Flow Rate (L/min): 10 L/min    R Recommendations: See Admitting Provider Note  Report given to:   Additional Notes: Aox4, tolerated po meds, ambulates well,

## 2023-01-11 NOTE — ED Notes (Signed)
Pt now back in room on O2, 8L [place]. Did not want to be placed on cardiac monitor. Spo2 92%

## 2023-01-11 NOTE — Progress Notes (Signed)
Patient was sleeping, he woke up and was a little confused about where he was and why. RN explained everything to pt and helped him to be reoriented. Patient is also removing O2, sats will drop to low 80s. RN explained that his O2 demand has increased and he needs to keep his HFNC on.

## 2023-01-11 NOTE — Progress Notes (Signed)
Interval history Concern for left knee pain.  Hurts when he walks.  Started a few days ago.  Breathing feels the same as yesterday.  Physical exam Blood pressure (!) 156/101, pulse 85, temperature 97.7 F (36.5 C), temperature source Oral, resp. rate 18, SpO2 91 %.  Comfortable appearing Heart rate and rhythm are regular Breathing is unlabored on 10 L O2 via nasal cannula, some expiratory wheezing Skin is warm and dry Left knee tenderness along lateral joint line, no effusions, erythema, or warmth Fingernail clubbing Alert and oriented Pleasant, mood and affect are concordant  Intake/Output Summary (Last 24 hours) at 01/11/2023 1016 Last data filed at 01/11/2023 0646 Gross per 24 hour  Intake --  Output 2530 ml  Net -2530 ml   Images and other studies Left knee x-ray without findings to explain the pain.  No degenerative changes.  Higher than usual positioning of the patella.  Assessment and plan Hospital day Richard [name] is a 52 y.o. admitted for acute on chronic hypoxic respiratory failure due to COPD exacerbation, found to have RSV.  Principal Problem:   Acute hypoxic respiratory failure (HCC) Active Problems:   COPD exacerbation (HCC)   Acute bronchiolitis due to respiratory syncytial virus (RSV)   History of seizures   Knee pain, left  Acute on chronic hypoxic respiratory failure COPD exacerbation Underlying RSV infection.  Requiring 10 L/min to maintain SpO2 around 90.  Low index of suspicion for pneumonia or pulmonary embolism. - Azithromycin 500 mg p.o. daily - Prednisone 40 mg p.o. daily - DuoNebs every 6 hours - Continue home maintenance inhaler  Left knee pain No history of trauma.  No warmth, erythema, or effusion on exam.  He has some tenderness around the lateral joint line of the left knee.  X-ray without obvious correlating findings. Could be due to arthralgias secondary to RSV. - Tylenol as needed  Tobacco use  disorder Continues to smoke, about 1/2 pack a day.  Has never tried medication for tobacco cessation.  Cost may be an issue.  Not interested in nicotine replacement therapy.  He does express an interest in quitting. - Will counsel about medical therapy   History of seizures Has not had a seizure in several years - Continue levetiracetam 500 mg twice daily   Hypertension Last BP 133/98.  Continue home regimen - Losartan 100 mg daily - Clonidine 0.3 mg p.o. twice daily   Diet: Heart healthy IVF: none VTE: lovenox Code: Full   [name] MD 01/11/2023, 10:16 AM  Pager: [phone] After 5pm on weekdays and 1pm on weekends: [phone]

## 2023-01-11 NOTE — ED Notes (Addendum)
Pt refused all medicines due to not eating breakfast. Every attempt was made to satisfy his requests, yet no were successful.

## 2023-01-11 NOTE — ED Notes (Signed)
Pt ambulated to restroom and refused O2. Educated pt and still refused.

## 2023-01-11 NOTE — ED Notes (Signed)
Pt angry due to breakfast quantity and quality. Reordered breakfast and unhappy with that meal as well.

## 2023-01-11 NOTE — Progress Notes (Signed)
Radiation Oncology         [phone]) [phone] ________________________________  Name: Richard [name]        MRN: [id number]  Date of Service: 01/13/2023 DOB: 1971-06-24  JE:HUDJSHFW, [facility]  Curt Bears, MD     REFERRING PHYSICIAN: Curt Bears, MD   DIAGNOSIS: The primary encounter diagnosis was Malignant neoplasm of overlapping sites of right lung [facility]). A diagnosis of Malignant neoplasm of lower lobe of right lung [facility]) was also pertinent to this visit.   HISTORY OF PRESENT ILLNESS: Richard [name] is a 52 y.o. male seen at the request of Dr. Julien Nordmann for progressive recurrent lung cancer. The patient was diagnosed in early 2023  with  Stage IIIb non-small cell lung cancer of the right upper lobe overlapping into the right lower lobe though his endobronchial brushings at the time of his diagnosis only showed suspicious for malignancy.  The patient had significant difficulties with social access for his medical care and had no showed multiple appointments and medical oncology and palliative medicine.  He was able to receive 10 fractions of palliative radiation to his right lung between 2/22 and 02/23/2022.  He recently presented again for medical care in the emergency department and was found on CT scan on 12/25/2022 to have enlargement of the right upper lobe mass abutting the posterior hilum and posterior mediastinum with a satellite nodule measuring 1 cm, interval enlargement of AP window and right lower paratracheal adenopathy was also identified.  An MRI of the brain on 12/26/2022 showed no evidence of metastatic disease.  He did undergo a core biopsy under CT guidance of the lung, and pathology showed poorly differentiated carcinoma with neuroendocrine features.  His case was discussed in multidisciplinary thoracic oncology conference, he is also met with Dr. Julien Nordmann who is recommending chemoradiation.  He is seen today to discuss treatment recommendations of his  cancer.    PREVIOUS RADIATION THERAPY: Yes   02/04/2022 through 02/23/2022 Site Technique Total Dose (Gy) Dose per Fx (Gy) Completed Fx Beam Energies  Lung, Right: Lung_R 3D 30/30 3 10/10 6X     PAST MEDICAL HISTORY:  Past Medical History:  Diagnosis Date   Asthma    Brain bleed ([place])    DVT (deep venous thrombosis) ([place]) 02/19/2015   RLE   GERD (gastroesophageal reflux disease)    History of home oxygen therapy    Hypertension    Seizures ([place])    last episode 03/2013   Stroke [facility])        PAST SURGICAL HISTORY: Past Surgical History:  Procedure Laterality Date   BRONCHIAL BIOPSY  02/10/2022   Procedure: BRONCHIAL BIOPSIES;  Surgeon: Rigoberto Noel, MD;  Location: [facility]OSCOPY;  Service: Cardiopulmonary;;   BRONCHIAL BRUSHINGS  02/10/2022   Procedure: BRONCHIAL BRUSHINGS;  Surgeon: Rigoberto Noel, MD;  Location: [facility];  Service: Cardiopulmonary;;   BRONCHIAL NEEDLE ASPIRATION BIOPSY  02/10/2022   Procedure: BRONCHIAL NEEDLE ASPIRATION BIOPSIES;  Surgeon: Rigoberto Noel, MD;  Location: [facility];  Service: Cardiopulmonary;;   BRONCHIAL WASHINGS  02/10/2022   Procedure: BRONCHIAL WASHINGS;  Surgeon: Rigoberto Noel, MD;  Location: [facility];  Service: Cardiopulmonary;;   ENDOBRONCHIAL ULTRASOUND Bilateral 02/10/2022   Procedure: ENDOBRONCHIAL ULTRASOUND;  Surgeon: Rigoberto Noel, MD;  Location: [facility];  Service: Cardiopulmonary;  Laterality: Bilateral;   LEG SURGERY     VIDEO BRONCHOSCOPY  02/10/2022   Procedure: VIDEO BRONCHOSCOPY WITHOUT FLUORO;  Surgeon: Rigoberto Noel, MD;  Location: [facility];  Service: Cardiopulmonary;;     FAMILY HISTORY:  Family History  Problem Relation Age of Onset   Cancer Father    Diabetes Mellitus II Sister      SOCIAL HISTORY:  reports that he has been smoking cigarettes. He started smoking about 37 years ago. He has a 34.00 pack-year smoking history. He has never used smokeless tobacco. He reports current alcohol use.  He reports that he does not currently use drugs after having used the following drugs: Cocaine. The patient is married and lives in [place].    ALLERGIES: Dilaudid [hydromorphone hcl] and Tape   MEDICATIONS:  Current Outpatient Medications  Medication Sig Dispense Refill   albuterol (VENTOLIN HFA) 108 (90 Base) MCG/ACT inhaler Inhale 1-2 puffs into the lungs every 6 (six) hours as needed for wheezing or shortness of breath. 8 g 3   [name] 160-4.5 MCG/ACT inhaler Inhale 2 puffs into the lungs daily.     cloNIDine (CATAPRES) 0.3 MG tablet Take 1 tablet (0.3 mg total) by mouth 2 (two) times daily. 60 tablet 0   diclofenac Sodium (VOLTAREN) 1 % GEL Apply 4 g topically 4 (four) times daily.     levETIRAcetam (KEPPRA) 500 MG tablet Take 500 mg by mouth 2 (two) times daily.     losartan (COZAAR) 100 MG tablet Take 100 mg by mouth daily.     OXYGEN Inhale 2 L/min into the lungs as needed (for shortness of breath).     predniSONE (DELTASONE) 20 MG tablet Take 2 tablets (40 mg total) by mouth daily with breakfast. 4 tablet 0   prochlorperazine (COMPAZINE) 10 MG tablet Take 1 tablet (10 mg total) by mouth every 6 (six) hours as needed for nausea or vomiting. 30 tablet 0   SPIRIVA RESPIMAT 2.5 MCG/ACT AERS Inhale 2 each into the lungs daily.     No current facility-administered medications for this encounter.     REVIEW OF SYSTEMS: On review of systems, the patient reports that he is doing okay. He is still having sinus congestion and nasal drip. He reports he is using O2 at 2-3 L at home, but is able to go without it at times. He denies any shortness of breath today, and has lost about 25 pounds in the last two months. No other complaints are verbalized.      PHYSICAL EXAM:  Wt Readings from Last 3 Encounters:  01/13/23 208 lb (94.3 kg)  01/12/23 196 lb 3.2 oz (89 kg)  01/07/23 199 lb 12.8 oz (90.6 kg)   Temp Readings from Last 3 Encounters:  01/13/23 97.9 F (36.6 C) (Temporal)   01/12/23 (!) 97.5 F (36.4 C) (Oral)  01/07/23 98.1 F (36.7 C) (Temporal)   BP Readings from Last 3 Encounters:  01/13/23 (!) 154/110  01/12/23 (!) 144/106  01/07/23 (!) 168/78   Pulse Readings from Last 3 Encounters:  01/13/23 (!) 106  01/12/23 95  01/07/23 99   Pain Assessment Pain Score: 0-No pain/10  In general this is a chronically ill appearing African American male in no acute distress. He's alert and oriented x4 and appropriate throughout the examination. Cardiopulmonary assessment is negative for acute distress and he exhibits normal effort.     ECOG = 1  0 - Asymptomatic (Fully active, able to carry on all predisease activities without restriction)  1 - Symptomatic but completely ambulatory (Restricted in physically strenuous activity but ambulatory and able to carry out work of a light or sedentary nature. For example, light housework, office work)  2 - Symptomatic, <50% in bed during the day (Ambulatory and capable of all self care but unable to carry out any work activities. Up and about more than 50% of waking hours)  3 - Symptomatic, >50% in bed, but not bedbound (Capable of only limited self-care, confined to bed or chair 50% or more of waking hours)  4 - Bedbound (Completely disabled. Cannot carry on any self-care. Totally confined to bed or chair)  5 - Death   Eustace Pen MM, Creech RH, Tormey DC, et al. [phone]). "Toxicity and response criteria of the [facility]". [place] Oncol. 5 (6): 649-55    LABORATORY DATA:  Lab Results  Component Value Date   WBC 8.7 01/12/2023   HGB 15.4 01/12/2023   HCT 47.4 01/12/2023   MCV 97.1 01/12/2023   PLT 283 01/12/2023   Lab Results  Component Value Date   NA 137 01/12/2023   K 3.9 01/12/2023   CL 103 01/12/2023   CO2 24 01/12/2023   Lab Results  Component Value Date   ALT 15 01/09/2023   AST 18 01/09/2023   ALKPHOS 61 01/09/2023   BILITOT 0.5 01/09/2023      RADIOGRAPHY: DG  Knee 1-2 Views Left  Result Date: 01/11/2023 CLINICAL DATA:  Left lateral knee pain.  No known trauma. EXAM: LEFT KNEE - 1-2 VIEW COMPARISON:  None Available. FINDINGS: No visible joint effusion. No degenerative change of the weight-bearing compartment or patellofemoral joint. Slightly high positioning of the patella incidentally noted. Benign appearing cortical thickening of the anterior cortex of the proximal tibial diaphysis, not significant. IMPRESSION: 1. No acute finding to explain the clinical symptoms. No visible degenerative changes. 2. Mild patella alta. Electronically Signed   By: Nelson Chimes M.D.   On: 01/11/2023 08:21   DG Chest 1 View  Result Date: 01/09/2023 CLINICAL DATA:  Cough and shortness of breath starting a few days ago with worsening headaches and lightheadedness and weakness EXAM: CHEST  1 VIEW COMPARISON:  12/28/2022 FINDINGS: Similar appearance of the right hilar mass and architectural distortion in the right upper lung. Stable cardiomediastinal silhouette. No new focal consolidation, pleural effusion, or pneumothorax. No acute osseous abnormality. IMPRESSION: No significant change from 12/28/2022. Right hilar mass and architectural distortion previously evaluated with CT 12/25/2022. Electronically Signed   By: Placido Sou M.D.   On: 01/09/2023 19:19   CT LUNG MASS BIOPSY  Result Date: 12/28/2022 INDICATION: Lung mass EXAM: CT-GUIDED RIGHT LUNG MASS BIOPSY COMPARISON:  CTA PE and chest XR, 12/25/2022. MEDICATIONS: None. ANESTHESIA/SEDATION: Moderate (conscious) sedation was employed during this procedure. A total of Versed 2 mg and Fentanyl 100 mcg was administered intravenously. Moderate Sedation Time: 25 minutes. The patient's level of consciousness and vital signs were monitored continuously by radiology nursing throughout the procedure under my direct supervision. CONTRAST:  None COMPLICATIONS: None immediate. PROCEDURE: RADIATION DOSE REDUCTION: This exam was performed  according to the departmental dose-optimization program which includes automated exposure control, adjustment of the mA and/or kV according to patient size and/or use of iterative reconstruction technique. Informed consent was obtained from the patient following an explanation of the procedure, risks, benefits and alternatives. The patient understands,agrees and consents for the procedure. All questions were addressed. A time out was performed prior to the initiation of the procedure. The patient was positioned prone on the CT table and a limited chest CT was performed for procedural planning demonstrating RIGHT posterior lung mass. The operative site was prepped and draped in  the usual sterile fashion. Under sterile conditions and local anesthesia, a 17 gauge coaxial needle was advanced into the peripheral aspect of the nodule. Positioning was confirmed with intermittent CT fluoroscopy and followed by the acquisition of 3 course with an 18 gauge core needle biopsy device. The coaxial needle was removed following hemostatic patch and superficial hemostasis was achieved with manual compression. Limited post procedural chest CT was negative for pneumothorax or additional complication. A dressing was placed. The patient tolerated the procedure well without immediate postprocedural complication. The patient was escorted to have an upright chest radiograph. IMPRESSION: Successful CT guided core needle core biopsy of RIGHT lung mass. Michaelle Birks, MD [facility] Electronically Signed   By: Michaelle Birks M.D.   On: 12/28/2022 16:10   DG Chest Port 1 View  Result Date: 12/28/2022 CLINICAL DATA:  Post biopsy EXAM: PORTABLE CHEST 1 VIEW COMPARISON:  CT 12/25/2022, chest x-ray 12/25/2022, CT 06/10/2022 FINDINGS: Right hilar mass and distortion. No pleural effusion or visible pneumothorax. Stable cardiac size. IMPRESSION: Negative for pneumothorax or pleural effusion.  Right hilar mass and distortion, reference chest CT 12/25/2022. Electronically Signed   By: Donavan Foil M.D.   On: 12/28/2022 15:52   MR BRAIN W WO CONTRAST  Result Date: 12/26/2022 CLINICAL DATA:  Lung cancer staging EXAM: MRI HEAD WITHOUT AND WITH CONTRAST TECHNIQUE: Multiplanar, multiecho pulse sequences of the brain and surrounding structures were obtained without and with intravenous contrast. CONTRAST:  84mL GADAVIST GADOBUTROL 1 MMOL/ML IV SOLN COMPARISON:  02/03/2022 FINDINGS: Brain: No enhancement or swelling to suggest metastatic disease. Extensive chronic small vessel ischemia in the cerebral white matter, confluent around the lateral ventricles. Unchanged large area of encephalomalacia in the anterior left frontal lobe. Stable brain volume. No acute infarct, hydrocephalus, or collection. Vascular: Major flow voids and vascular enhancements are preserved Skull and upper cervical spine: Curved no focal marrow lesion. Sinuses/Orbits: Chronic left maxillary sinusitis with wall thickening and complete opacification. Negative orbits. IMPRESSION: Stable compared to February 2023. Negative for metastatic disease to the brain. Electronically Signed   By: Jorje Guild M.D.   On: 12/26/2022 17:19   VAS [place] LOWER EXTREMITY VENOUS (DVT)  Result Date: 12/26/2022  Lower Venous DVT Study Patient Name:  SCHNEIDER WARCHOL  Date of Exam:   12/26/2022 Medical Rec #: [id number]     Accession #:    [id number] Date of Birth: 11-Oct-1971     Patient Gender: M Patient Age:   35 years Exam Location:  [facility] Procedure:      VAS [place] LOWER EXTREMITY VENOUS (DVT) Referring Phys: JULIE MACHEN --------------------------------------------------------------------------------  Indications: Swelling, and Pain.  Risk Factors: Cancer of the lung diagnosed 2021 no follow up. Comparison Study: Prior negative bilateral LEV done 02/03/22 Performing Technologist: Sharion Dove RVS  Examination Guidelines: A complete evaluation  includes B-mode imaging, spectral Doppler, color Doppler, and power Doppler as needed of all accessible portions of each vessel. Bilateral testing is considered an integral part of a complete examination. Limited examinations for reoccurring indications may be performed as noted. The reflux portion of the exam is performed with the patient in reverse Trendelenburg.  +-----+---------------+---------+-----------+----------+--------------+ RIGHTCompressibilityPhasicitySpontaneityPropertiesThrombus Aging +-----+---------------+---------+-----------+----------+--------------+ CFV  Full           Yes      Yes                                 +-----+---------------+---------+-----------+----------+--------------+   +---------+---------------+---------+-----------+----------+--------------+  LEFT     CompressibilityPhasicitySpontaneityPropertiesThrombus Aging +---------+---------------+---------+-----------+----------+--------------+ CFV      Full           Yes      Yes                                 +---------+---------------+---------+-----------+----------+--------------+ SFJ      Full                                                        +---------+---------------+---------+-----------+----------+--------------+ FV Prox  Full                                                        +---------+---------------+---------+-----------+----------+--------------+ FV Mid   Full                                                        +---------+---------------+---------+-----------+----------+--------------+ FV DistalFull                                                        +---------+---------------+---------+-----------+----------+--------------+ PFV      Full                                                        +---------+---------------+---------+-----------+----------+--------------+ POP      Full           Yes      Yes                                  +---------+---------------+---------+-----------+----------+--------------+ PTV      Full                                                        +---------+---------------+---------+-----------+----------+--------------+ PERO     Full                                                        +---------+---------------+---------+-----------+----------+--------------+     Summary: RIGHT: - No evidence of common femoral vein obstruction.  LEFT: - There is no evidence of deep vein thrombosis in the lower extremity.  - No cystic structure found in the popliteal fossa.  *See table(s) above for measurements and observations.  Electronically signed by Monica Martinez MD on 12/26/2022 at 11:45:54 AM.    Final    ECHOCARDIOGRAM COMPLETE  Result Date: 12/26/2022    ECHOCARDIOGRAM REPORT   Patient Name:   Decorian Schuenemann Date of Exam: 12/26/2022 Medical Rec #:  [id number]    Height:       69.0 in Accession #:    [id number]   Weight:       225.0 lb Date of Birth:  Jul 01, 1971    BSA:          2.172 m Patient Age:    61 years     BP:           114/76 mmHg Patient Gender: M            HR:           92 bpm. Exam Location:  Inpatient Procedure: 2D Echo, Cardiac Doppler and Color Doppler Indications:    Dyspnea  History:        Patient has prior history of Echocardiogram examinations, most                 recent 02/04/2022. COPD; Risk Factors:Hypertension and Current                 Smoker. Lung cancer. Hx TIA.  Sonographer:    Clayton Lefort RDCS (AE) Referring Phys: [place]  1. Left ventricular ejection fraction, by estimation, is 60 to 65%. The left ventricle has normal function. The left ventricle has no regional wall motion abnormalities. There is moderate concentric left ventricular hypertrophy. Left ventricular diastolic parameters are consistent with Grade I diastolic dysfunction (impaired relaxation).  2. Right ventricular systolic function is normal. The right ventricular size is normal.  3. The  mitral valve is normal in structure. No evidence of mitral valve regurgitation. No evidence of mitral stenosis.  4. The aortic valve has an indeterminant number of cusps. Aortic valve regurgitation is mild. No aortic stenosis is present.  5. The inferior vena cava is normal in size with greater than 50% respiratory variability, suggesting right atrial pressure of 3 mmHg. Comparison(s): No significant change from prior study. FINDINGS  Left Ventricle: Left ventricular ejection fraction, by estimation, is 60 to 65%. The left ventricle has normal function. The left ventricle has no regional wall motion abnormalities. The left ventricular internal cavity size was normal in size. There is  moderate concentric left ventricular hypertrophy. Left ventricular diastolic parameters are consistent with Grade I diastolic dysfunction (impaired relaxation). Right Ventricle: The right ventricular size is normal. Right ventricular systolic function is normal. Left Atrium: Left atrial size was normal in size. Right Atrium: Right atrial size was normal in size. Pericardium: Trivial pericardial effusion is present. Mitral Valve: The mitral valve is normal in structure. No evidence of mitral valve regurgitation. No evidence of mitral valve stenosis. Tricuspid Valve: The tricuspid valve is normal in structure. Tricuspid valve regurgitation is trivial. No evidence of tricuspid stenosis. Aortic Valve: The aortic valve has an indeterminant number of cusps. Aortic valve regurgitation is mild. Aortic regurgitation PHT measures 409 msec. No aortic stenosis is present. Aortic valve mean gradient measures 2.0 mmHg. Aortic valve peak gradient measures 3.8 mmHg. Aortic valve area, by VTI measures 4.38 cm. Pulmonic Valve: The pulmonic valve was normal in structure. Pulmonic valve regurgitation is not visualized. No evidence of pulmonic stenosis. Aorta: The aortic root is normal in size and structure. Venous: The inferior vena cava is normal in  size with  greater than 50% respiratory variability, suggesting right atrial pressure of 3 mmHg. IAS/Shunts: No atrial level shunt detected by color flow Doppler.  LEFT VENTRICLE PLAX 2D LVIDd:         4.30 cm   Diastology LVIDs:         3.00 cm   LV e' medial:    4.79 cm/s LV PW:         2.00 cm   LV E/e' medial:  13.5 LV IVS:        1.40 cm   LV e' lateral:   7.72 cm/s LVOT diam:     2.30 cm   LV E/e' lateral: 8.4 LV SV:         71 LV SV Index:   33 LVOT Area:     4.15 cm  RIGHT VENTRICLE             IVC RV Basal diam:  3.30 cm     IVC diam: 1.50 cm RV S prime:     16.50 cm/s TAPSE (M-mode): 1.9 cm LEFT ATRIUM             Index        RIGHT ATRIUM           Index LA diam:        2.80 cm 1.29 cm/m   RA Area:     13.40 cm LA Vol (A2C):   35.6 ml 16.39 ml/m  RA Volume:   32.70 ml  15.05 ml/m LA Vol (A4C):   28.3 ml 13.03 ml/m LA Biplane Vol: 32.2 ml 14.82 ml/m  AORTIC VALVE AV Area (Vmax):    4.26 cm AV Area (Vmean):   4.31 cm AV Area (VTI):     4.38 cm AV Vmax:           97.00 cm/s AV Vmean:          66.700 cm/s AV VTI:            0.163 m AV Peak Grad:      3.8 mmHg AV Mean Grad:      2.0 mmHg LVOT Vmax:         99.50 cm/s LVOT Vmean:        69.200 cm/s LVOT VTI:          0.172 m LVOT/AV VTI ratio: 1.06 AI PHT:            409 msec  AORTA Ao Root diam: 3.90 cm MITRAL VALVE MV Area (PHT): 3.21 cm    SHUNTS MV Decel Time: 236 msec    Systemic VTI:  0.17 m MV E velocity: 64.60 cm/s  Systemic Diam: 2.30 cm MV A velocity: 98.50 cm/s MV E/A ratio:  0.66 Kirk Ruths MD Electronically signed by Kirk Ruths MD Signature Date/Time: 12/26/2022/11:34:31 AM    Final    CT Angio Chest PE W and/or Wo Contrast  Result Date: 12/25/2022 CLINICAL DATA:  Shortness of breath and hypoxia. History of lung carcinoma. EXAM: CT ANGIOGRAPHY CHEST WITH CONTRAST TECHNIQUE: Multidetector CT imaging of the chest was performed using the standard protocol during bolus administration of intravenous contrast. Multiplanar CT image  reconstructions and MIPs were obtained to evaluate the vascular anatomy. RADIATION DOSE REDUCTION: This exam was performed according to the departmental dose-optimization program which includes automated exposure control, adjustment of the mA and/or kV according to patient size and/or use of iterative reconstruction technique. CONTRAST:  41mL OMNIPAQUE IOHEXOL 350 MG/ML SOLN COMPARISON:  CT of  the chest on 06/10/2022 and prior CTA on 02/02/2022 FINDINGS: Cardiovascular: Pulmonary arteries are adequately opacified. There is no evidence of pulmonary embolism. Stable main pulmonary artery dilatation measuring up to 3.6 cm. Stable dilated ascending thoracic aorta measuring up to 4.3 cm. No evidence of aortic dissection. Normal heart size. No visualized calcified coronary artery plaque. No pericardial fluid identified. Mediastinum/Nodes: AP window node on image 49 shows enlargement measuring 11 mm in short axis compared to 8 mm previously. Right lower paratracheal lymph node demonstrates interval enlargement measuring approximately 12 mm in short axis compared to 10 mm previously. Subcarinal lymph node difficult to measure as it abuts tumor but likely stable in size measuring roughly 15 mm. There are multiple other scattered smaller mediastinal nodes. Lungs/Pleura: Posterior right upper lobe mass abutting the posterior hilum and posterior mediastinum may be slightly increased in size since the prior study with current measured dimensions of approximately 7.9 x 4.1 x 4.9 cm (previously 7.2 x 4.1 x 4.8 cm). The satellite nodule more superiorly in the right upper lobe appears stable in size measuring approximately 1 cm. No further definite visualized smaller nodules in the right upper lobe. Lungs demonstrate generalized septal thickening since the prior study with scattered areas of ground-glass opacity and associated pulmonary venous distension. Findings are suggestive of mild pulmonary interstitial edema. No associated  pleural fluid, focal airspace consolidation or pneumothorax. Upper Abdomen: Stable low-density left adrenal adenoma measuring up to roughly 2.7 cm. This has previously been demonstrated to have long-term stability and does not require follow-up. Musculoskeletal: No bony lesions or fractures identified. Review of the MIP images confirms the above findings. IMPRESSION: 1. No evidence of pulmonary embolism. 2. Stable main pulmonary artery dilatation consistent with pulmonary hypertension. 3. Stable dilated ascending thoracic aorta measuring 4.3 cm. Recommend annual imaging followup by CTA or MRA. This recommendation follows 2010 ACCF/AHA/AATS/ACR/ASA/SCA/SCAI/SIR/STS/SVM Guidelines for the Diagnosis and Management of Patients with Thoracic Aortic Disease. Circulation. 2010; 121: C166-A630. Aortic aneurysm NOS (ICD10-I71.9) 4. Mild pulmonary interstitial edema without pleural effusions. 5. Slight enlargement of the posterior right upper lobe mass abutting the posterior hilum and posterior mediastinum. The satellite nodule more superiorly in the right upper lobe appears stable in size measuring approximately 1 cm. No further definite visualized smaller nodules in the right upper lobe. 6. Interval enlargement of AP window and right lower paratracheal lymph nodes since the prior study. This finding as well as slight enlargement of the right perihilar upper lobe mass is suggestive of progression of malignancy. Recommend follow-up with Oncology and consideration of additional follow-up PET imaging. 7. Stable low-density left adrenal adenoma. This has previously been demonstrated to have long-term stability and does not require follow-up. Electronically Signed   By: Aletta Edouard M.D.   On: 12/25/2022 08:29   DG Chest 2 View  Result Date: 12/25/2022 CLINICAL DATA:  52 year old male with history of chest pain and shortness of breath. EXAM: CHEST - 2 VIEW COMPARISON:  Chest x-ray 02/02/2022.  Chest CT 06/10/2022.  FINDINGS: When compared to the prior chest radiograph the right upper lobe pulmonary mass appears slightly regressed, but there is persistent soft tissue fullness in the medial right upper lobe as well as the right suprahilar region, with surrounding interstitial prominence and architectural distortion, likely to reflect a combination of evolving postradiation changes, and potentially residual tumor (difficult to assess on today's plain film examination). Left lung is clear. No pleural effusions. No pneumothorax. No evidence of pulmonary edema. Heart size is mildly enlarged. IMPRESSION: 1. No  definite radiographic evidence of acute cardiopulmonary disease. 2. Evidence of treated right upper lobe neoplasm. 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 Residual neoplasm is not excluded. Follow-up contrast-enhanced chest CT should be considered in the near future when clinically appropriate to re-evaluate these findings. Electronically Signed   By: Vinnie Langton M.D.   On: 12/25/2022 05:46       IMPRESSION/PLAN: 1. Recurrent Progressive Stage IIIB, lung cancer with neuroendocrine features of the right upper lobe overlapping into the right lower lobe. Dr. Lisbeth Renshaw discusses the pathology findings and reviews the nature of recurrent disease, and reviewed the discussion regarding chemoradiation. We discussed the risks, benefits, short, and long term effects of radiotherapy, as well as the curative intent, and the patient is interested in proceeding. Dr. Lisbeth Renshaw discusses the delivery and logistics of radiotherapy and anticipates a course of 6 weeks of radiotherapy. Written consent is obtained and placed in the chart, a copy was provided to the patient. The patient will simulate today. We anticipate starting his treatment on 01/19/23 along with the beginning of his chemotherapy.    In a visit lasting 45 minutes, greater than 50% of the time was spent face to face discussing the  patient's condition, in preparation for the discussion, and coordinating the patient's care.   The above documentation reflects my direct findings during this shared patient visit. Please see the separate note by Dr. Lisbeth Renshaw on this date for the remainder of the patient's plan of care.    Carola Rhine, [facility]   **Disclaimer: This note was dictated with voice recognition software. Similar sounding words can inadvertently be transcribed and this note may contain transcription errors which may not have been corrected upon publication of note.**

## 2023-01-12 ENCOUNTER — Other Ambulatory Visit (HOSPITAL_COMMUNITY): Payer: Self-pay

## 2023-01-12 ENCOUNTER — Encounter: Payer: Self-pay | Admitting: Internal Medicine

## 2023-01-12 DIAGNOSIS — J9601 Acute respiratory failure with hypoxia: Secondary | ICD-10-CM | POA: Diagnosis not present

## 2023-01-12 DIAGNOSIS — J210 Acute bronchiolitis due to respiratory syncytial virus: Secondary | ICD-10-CM

## 2023-01-12 DIAGNOSIS — J441 Chronic obstructive pulmonary disease with (acute) exacerbation: Secondary | ICD-10-CM | POA: Diagnosis not present

## 2023-01-12 DIAGNOSIS — F17210 Nicotine dependence, cigarettes, uncomplicated: Secondary | ICD-10-CM | POA: Diagnosis not present

## 2023-01-12 LAB — BASIC METABOLIC PANEL
Anion gap: 10 (ref 5–15)
BUN: 16 mg/dL (ref 6–20)
CO2: 24 mmol/L (ref 22–32)
Calcium: 8.9 mg/dL (ref 8.9–10.3)
Chloride: 103 mmol/L (ref 98–111)
Creatinine, Ser: 1.01 mg/dL (ref 0.61–1.24)
GFR, Estimated: >60 mL/min (ref >60)
Glucose, Bld: 107 mg/dL — ABNORMAL HIGH (ref 70–99)
Potassium: 3.9 mmol/L (ref 3.5–5.1)
Sodium: 137 mmol/L (ref 135–145)

## 2023-01-12 LAB — CBC
HCT: 47.4 % (ref 39.0–52.0)
Hemoglobin: 15.4 g/dL (ref 13.0–17.0)
MCH: 31.6 pg (ref 26.0–34.0)
MCHC: 32.5 g/dL (ref 30.0–36.0)
MCV: 97.1 fL (ref 80.0–100.0)
Platelets: 283 10*3/uL (ref 150–400)
RBC: 4.88 MIL/uL (ref 4.22–5.81)
RDW: 14.6 % (ref 11.5–15.5)
WBC: 8.7 10*3/uL (ref 4.0–10.5)
nRBC: 0 % (ref 0.0–0.2)

## 2023-01-12 MED ORDER — PREDNISONE 20 MG PO TABS
40.0000 mg | ORAL_TABLET | Freq: Every day | ORAL | 0 refills | Status: DC
  Filled 2023-01-12: qty 4, 2d supply, fill #0

## 2023-01-12 MED ORDER — DICLOFENAC SODIUM 1 % EX GEL: 4.0000 g | Freq: Four times a day (QID) | CUTANEOUS | Status: DC

## 2023-01-12 MED ORDER — ORAL CARE MOUTH RINSE: 15.0000 mL | OROMUCOSAL | Status: DC | PRN

## 2023-01-12 MED ORDER — DICLOFENAC SODIUM 1 % EX GEL
4.0000 g | Freq: Four times a day (QID) | CUTANEOUS | Status: DC
  Administered 2023-01-12: 4 g via TOPICAL
  Filled 2023-01-12: qty 100

## 2023-01-12 MED ORDER — NICOTINE 21 MG/24HR TD PT24
21.0000 mg | MEDICATED_PATCH | Freq: Every day | TRANSDERMAL | Status: DC
  Administered 2023-01-12: 21 mg via TRANSDERMAL
  Filled 2023-01-12: qty 1

## 2023-01-12 NOTE — Progress Notes (Signed)
SATURATION QUALIFICATIONS: (This note is used to comply with regulatory documentation for home oxygen)  Patient Saturations on Room Air at Rest = 92%  Patient Saturations on Room Air while Ambulating = 86%  Patient Saturations on 3 Liters of oxygen while Ambulating = 3%  Please briefly explain why patient needs home oxygen:

## 2023-01-12 NOTE — Progress Notes (Signed)
   01/12/23 1039  Mobility  Activity Ambulated independently in hallway  Level of Assistance Independent  Assistive Device None  Distance Ambulated (ft) 300 ft  Activity Response Tolerated well  Mobility Referral Yes  $Mobility charge 1 Mobility   Mobility Specialist Progress Note  Pt was in bed and agreeable. X2 standing  breaks d/t feeling SOB. Left in bed w/ all needs met and call bell in reach.   Lucious Groves Mobility Specialist  Please contact via [place] or Rehab office at [phone]

## 2023-01-12 NOTE — Discharge Instructions (Signed)
[name]  You were evaluated and treated in the hospital for shortness of breath.  The results of your evaluation showed that you have a virus called RSV (respiratory syncytial virus).  This virus is a common cause of the cold.  Because of your underlying severe COPD, you are at risk for severe illness from this virus.  You were treated with steroids and inhalers.  You were also evaluated for knee pain.  An x-ray of your knee did not show any sign of damage or disease affecting the knee joint.  We are discharging you now that you are doing better. To help assist you on your road to recovery, I have written the following recommendations:   For your breathing, continue taking prednisone 40 mg daily with breakfast until January 15, 2023.  Continue using your home inhaler therapies.  Quitting smoking is the best thing that you can do for your lungs and your overall health.  Follow-up with your primary care provider soon as possible after leaving the hospital and talk to them about strategies for quitting smoking.  For your knee, you can use Voltaren gel, available over-the-counter at most drug stores.  It was a privilege to be a part of your hospital care team, and I hope you feel better as a result of your stay.  All the best, [name], MD

## 2023-01-12 NOTE — Progress Notes (Signed)
Pt discharged but refused to wear oxygen during d/c, wife at  bedside during d/c, PIV d/c,pressure dressing applied, pt insists on walking to the car, wheelchair offered, Rx given from [facility]. No distress noted, refused bp check. Pt escorted out in the wheelchair and on room air with wife and tech.

## 2023-01-12 NOTE — Progress Notes (Signed)
Nurse requested Mobility Specialist to perform oxygen saturation test with pt which includes removing pt from oxygen both at rest and while ambulating.  Below are the results from that testing.     Patient Saturations on Room Air at Rest = spO2 85%  Patient Saturations on Room Air while Ambulating = sp02 n/a% .  Rested and performed pursed lip breathing for 1 minute with sp02 at n/a%.  Patient Saturations on 3 Liters of oxygen while Ambulating = sp02 90%  At end of testing pt left in room on 3  Liters of oxygen.  Reported results to nurse.

## 2023-01-12 NOTE — Progress Notes (Signed)
Pt back in the room with labored breathing, o2 sats 77, placed back on oxygen 3l, now sats up to 90.

## 2023-01-12 NOTE — Discharge Summary (Signed)
Name: Richard [name] MRN: [id number] DOB: 07-Nov-1971 52 y.o. PCP: Medicine, [facility]  Date of Admission: 01/09/2023  6:31 PM Date of Discharge: 01/12/2023 3:14 PM Attending Physician: Axel Filler, *  Discharge Diagnosis: Principal Problem:   Acute hypoxic respiratory failure ([place]) Active Problems:   COPD exacerbation ([place])   Acute bronchiolitis due to respiratory syncytial virus (RSV)   History of seizures   Knee pain, left   Discharge Medications: Allergies as of 01/12/2023       Reactions   Dilaudid [hydromorphone Hcl] Nausea Only, Other (See Comments)   Bradycardia and hyperthermia, too   Tape Itching, Dermatitis        Medication List     TAKE these medications    albuterol 108 (90 Base) MCG/ACT inhaler Commonly known as: VENTOLIN HFA Inhale 1-2 puffs into the lungs every 6 (six) hours as needed for wheezing or shortness of breath.   [name] 160-4.5 MCG/ACT inhaler Generic drug: budesonide-formoterol Inhale 2 puffs into the lungs daily.   cloNIDine 0.3 MG tablet Commonly known as: CATAPRES Take 1 tablet (0.3 mg total) by mouth 2 (two) times daily.   diclofenac Sodium 1 % Gel Commonly known as: VOLTAREN Apply 4 g topically 4 (four) times daily.   levETIRAcetam 500 MG tablet Commonly known as: KEPPRA Take 500 mg by mouth 2 (two) times daily.   losartan 100 MG tablet Commonly known as: COZAAR Take 100 mg by mouth daily. What changed: Another medication with the same name was removed. Continue taking this medication, and follow the directions you see here.   OXYGEN Inhale 2 L/min into the lungs as needed (for shortness of breath).   predniSONE 20 MG tablet Commonly known as: DELTASONE Take 2 tablets (40 mg total) by mouth daily with breakfast. Start taking on: January 13, 2023   prochlorperazine 10 MG tablet Commonly known as: COMPAZINE Take 1 tablet (10 mg total) by mouth every 6 (six) hours as needed for nausea or  vomiting.   Spiriva Respimat 2.5 MCG/ACT Aers Generic drug: Tiotropium Bromide Monohydrate Inhale 2 each into the lungs daily.        Follow-up Appointments:  Follow-up Information     Medicine, [facility]. Call today.   Specialty: Family Medicine Why: Call today for an follow-up appointment as soon as possible after leaving the hospital. Contact information: [address] [place] [address] [phone]                 Disposition and follow-up: [name] is a 52 y.o. year old with COPD on 3L supplemental O2 at home, admitted for COPD exacerbation caused by RSV infection.  COPD exacerbation Discharged with home supplemental O2 requirement on hospital day 2. - Prednisone 40 mg daily through 01/14/23 to complete 5 day course  Tobacco use disorder Counseled about cessation. - Discuss medical therapy at follow-up  Lung cancer Scheduled to begin chemo/radiation in February 2024.  Hospital Course by problem list:  COPD exacerbation Presented with 1 day of worsening dyspnea. On 3L supplemental O2 for COPD. Was requiring 8-10 L to maintain saturation above 88%, and would occasionally drop to low 80s. Notably, he states that these desaturations are not unusual for him at home, even before his acute illness. He tested positive for RSV. Pneumonia and PE were considered but clinical syndrome wasn't consistent with these diagnoses. He was treated for COPD exacerbation with scheduled inhalers and steroids to good effect. His oxygen requirement decreased until  hospital day 2 when he was able to ambulate with home supplemental O2 level to maintain adequate SpO2. He was discharged in good condition that afternoon.  Knee pain No history of trauma.  No warmth, erythema, or effusion on exam.  He has some tenderness around the lateral joint line of the left knee.  X-ray without obvious correlating findings. Could be due to arthralgias secondary to RSV. Treated  with Voltaren gel to good effect.  Tobacco use disorder Continues to smoke, about 1/2 pack a day.  Has never tried medication for tobacco cessation.  Cost may be an issue.  Treated with nicotine replacement therapy while hospitalized.  He does express an interest in quitting.  Discharge Exam: No new concerns since yesterday. Breathing feels about the same.    Blood pressure (!) 144/106, pulse 95, temperature (!) 97.5 F (36.4 C), temperature source Oral, resp. rate 20, height 5' 9.5" (1.765 m), weight 89 kg, SpO2 90 %.  Comfortable appearing, sitting upright on side of bed Heart rate and rhythm are regular Breathing is regular and unlabored on 2 L/min via [place] Skin is warm and dry Alert and oriented Frustrated, mood and affect concordant  Pertinent studies and procedures:  Left knee x-ray IMPRESSION: 1. No acute finding to explain the clinical symptoms. No visible degenerative changes. 2. Mild patella alta.  Chest x-ray IMPRESSION: No significant change from 12/28/2022. Right hilar mass and architectural distortion previously evaluated with CT 12/25/2022.   Latest Reference Range & Units 01/12/23 01:35  Sodium 135 - 145 mmol/L 137  Potassium 3.5 - 5.1 mmol/L 3.9  Chloride 98 - 111 mmol/L 103  CO2 22 - 32 mmol/L 24  Glucose 70 - 99 mg/dL 107 (H)  BUN 6 - 20 mg/dL 16  Creatinine 0.61 - 1.24 mg/dL 1.01  Calcium 8.9 - 10.3 mg/dL 8.9  Anion gap 5 - 15  10  GFR, Estimated >60 mL/min >60  (H): Data is abnormally high   Latest Reference Range & Units 01/12/23 01:35  WBC 4.0 - 10.5 K/uL 8.7  RBC 4.22 - 5.81 MIL/uL 4.88  Hemoglobin 13.0 - 17.0 g/dL 15.4  HCT 39.0 - 52.0 % 47.4  MCV 80.0 - 100.0 fL 97.1  MCH 26.0 - 34.0 pg 31.6  MCHC 30.0 - 36.0 g/dL 32.5  RDW 11.5 - 15.5 % 14.6  Platelets 150 - 400 K/uL 283  nRBC 0.0 - 0.2 % 0.0   Discharge Instructions:   Discharge Instructions      [name] were evaluated and treated in the hospital for shortness of  breath.  The results of your evaluation showed that you have a virus called RSV (respiratory syncytial virus).  This virus is a common cause of the cold.  Because of your underlying severe COPD, you are at risk for severe illness from this virus.  You were treated with steroids and inhalers.  You were also evaluated for knee pain.  An x-ray of your knee did not show any sign of damage or disease affecting the knee joint.  We are discharging you now that you are doing better. To help assist you on your road to recovery, I have written the following recommendations:   For your breathing, continue taking prednisone 40 mg daily with breakfast until January 15, 2023.  Continue using your home inhaler therapies.  Quitting smoking is the best thing that you can do for your lungs and your overall health.  Follow-up with your primary care provider soon as  possible after leaving the hospital and talk to them about strategies for quitting smoking.  For your knee, you can use Voltaren gel, available over-the-counter at most drug stores.  It was a privilege to be a part of your hospital care team, and I hope you feel better as a result of your stay.  All the best, [name], MD     [name] MD 01/12/2023, 3:14 PM

## 2023-01-12 NOTE — Progress Notes (Signed)
Pharmacist Chemotherapy Monitoring - Initial Assessment    Anticipated start date: 01/19/2023   The following has been reviewed per standard work regarding the patient's treatment regimen: The patient's diagnosis, treatment plan and drug doses, and organ/hematologic function Lab orders and baseline tests specific to treatment regimen  The treatment plan start date, drug sequencing, and pre-medications Prior authorization status  Patient's documented medication list, including drug-drug interaction screen and prescriptions for anti-emetics and supportive care specific to the treatment regimen The drug concentrations, fluid compatibility, administration routes, and timing of the medications to be used The patient's access for treatment and lifetime cumulative dose history, if applicable  The patient's medication allergies and previous infusion related reactions, if applicable   Changes made to treatment plan:  N/A  Follow up needed:  [place], [facility], 01/12/2023  11:29 AM

## 2023-01-12 NOTE — TOC Progression Note (Signed)
Transition of Care [facility]) - Progression Note    Patient Details  Name: Terius Jacuinde MRN: [id number] Date of Birth: 01/30/1971  Transition of [facility]) CM/SW Contact  Zenon Mayo, RN Phone Number: 01/12/2023, 8:46 AM  Clinical Narrative:    From home, presents with,COPD,RSV,HFpEF,Lung CA 3b. TOC following.       Expected Discharge Plan and Services                                               Social Determinants of Health (SDOH) Interventions SDOH Screenings   Food Insecurity: No Food Insecurity (01/12/2023)  Housing: Low Risk  (01/12/2023)  Transportation Needs: No Transportation Needs (01/12/2023)  Utilities: Not At Risk (01/12/2023)  Tobacco Use: High Risk (01/10/2023)    Readmission Risk Interventions     No data to display

## 2023-01-12 NOTE — Progress Notes (Signed)
Pt left the floor and went with wife to the gift shop, tried to stop pt but refused, pt took his oxygen off and stated that he does this all the time at home, charge RN notified, MD notified as well, wife stated she is fine taking pt off the floor without being monitored and without oxygen. Pt is able to ambulate independently and is alert and oriented x4.

## 2023-01-12 NOTE — Progress Notes (Signed)
SATURATION QUALIFICATIONS: (This note is used to comply with regulatory documentation for home oxygen)  Patient Saturations on Room Air at Rest = 86 %  Patient Saturations on Room Air while Ambulating = 92%  Patient Saturations on 3 Liters of oxygen while Ambulating = 92%  Please briefly explain why patient needs home oxygen:

## 2023-01-12 NOTE — Progress Notes (Signed)
   01/12/23 1152  Mobility  Activity Ambulated independently in hallway  Level of Assistance Independent  Assistive Device None  Distance Ambulated (ft) 300 ft  Activity Response Tolerated well  Mobility Referral Yes  $Mobility charge 1 Mobility   Mobility Specialist Progress Note  Pt requesting another walk. Had no c/o pain. Left EOB w/ all needs met and call bell in reach  [employer] Specialist  Please contact via [place] or Rehab office at [phone]

## 2023-01-13 ENCOUNTER — Ambulatory Visit
Admission: RE | Admit: 2023-01-13 | Discharge: 2023-01-13 | Disposition: A | Discharge status: Home / Self Care | Payer: 59 | Source: Ambulatory Visit | Attending: Radiation Oncology | Admitting: Radiation Oncology

## 2023-01-13 ENCOUNTER — Encounter: Payer: Self-pay | Admitting: Radiation Oncology

## 2023-01-13 ENCOUNTER — Other Ambulatory Visit: Payer: Self-pay

## 2023-01-13 VITALS — BP 154/110 | HR 106 | Temp 97.9°F | Resp 24 | Ht 69.5 in | Wt 208.0 lb

## 2023-01-13 DIAGNOSIS — C3481 Malignant neoplasm of overlapping sites of right bronchus and lung: Secondary | ICD-10-CM

## 2023-01-13 DIAGNOSIS — J811 Chronic pulmonary edema: Secondary | ICD-10-CM | POA: Insufficient documentation

## 2023-01-13 DIAGNOSIS — I082 Rheumatic disorders of both aortic and tricuspid valves: Secondary | ICD-10-CM | POA: Insufficient documentation

## 2023-01-13 DIAGNOSIS — Z791 Long term (current) use of non-steroidal anti-inflammatories (NSAID): Secondary | ICD-10-CM | POA: Insufficient documentation

## 2023-01-13 DIAGNOSIS — C3431 Malignant neoplasm of lower lobe, right bronchus or lung: Secondary | ICD-10-CM

## 2023-01-13 DIAGNOSIS — G9389 Other specified disorders of brain: Secondary | ICD-10-CM | POA: Insufficient documentation

## 2023-01-13 DIAGNOSIS — I6782 Cerebral ischemia: Secondary | ICD-10-CM | POA: Insufficient documentation

## 2023-01-13 DIAGNOSIS — D3502 Benign neoplasm of left adrenal gland: Secondary | ICD-10-CM | POA: Insufficient documentation

## 2023-01-13 DIAGNOSIS — I119 Hypertensive heart disease without heart failure: Secondary | ICD-10-CM | POA: Insufficient documentation

## 2023-01-13 DIAGNOSIS — J45909 Unspecified asthma, uncomplicated: Secondary | ICD-10-CM | POA: Insufficient documentation

## 2023-01-13 DIAGNOSIS — Z8673 Personal history of transient ischemic attack (TIA), and cerebral infarction without residual deficits: Secondary | ICD-10-CM | POA: Insufficient documentation

## 2023-01-13 DIAGNOSIS — I10 Essential (primary) hypertension: Secondary | ICD-10-CM | POA: Diagnosis not present

## 2023-01-13 DIAGNOSIS — Z86718 Personal history of other venous thrombosis and embolism: Secondary | ICD-10-CM | POA: Diagnosis not present

## 2023-01-13 DIAGNOSIS — Z79899 Other long term (current) drug therapy: Secondary | ICD-10-CM | POA: Insufficient documentation

## 2023-01-13 DIAGNOSIS — F17210 Nicotine dependence, cigarettes, uncomplicated: Secondary | ICD-10-CM | POA: Insufficient documentation

## 2023-01-13 DIAGNOSIS — K219 Gastro-esophageal reflux disease without esophagitis: Secondary | ICD-10-CM | POA: Insufficient documentation

## 2023-01-13 NOTE — Progress Notes (Signed)
Nursing interview for a 52 year old male w/ Malignant neoplasm of overlapping sites of right lung ([place]). I verified patient's identity and began nursing interview.  Patient reports current positive diagnosis of RSV, and occasional SOB but is well managed w/ oxygen. No other issues reported at this time.  Meaningful use complete.  BP (!) 154/110 (BP Location: Left Arm, Patient Position: Sitting, Cuff Size: Normal)   Pulse (!) 106   Temp 97.9 F (36.6 C) (Temporal)   Resp (!) 24   Ht 5' 9.5" (1.765 m)   Wt 208 lb (94.3 kg)   SpO2 100%   BMI 30.28 kg/m    This concludes the interview.   Leandra Kern, LPN

## 2023-01-14 ENCOUNTER — Telehealth: Payer: Self-pay | Admitting: Internal Medicine

## 2023-01-14 ENCOUNTER — Emergency Department (HOSPITAL_COMMUNITY): Payer: 59

## 2023-01-14 ENCOUNTER — Inpatient Hospital Stay: Payer: 59

## 2023-01-14 ENCOUNTER — Telehealth: Payer: Self-pay

## 2023-01-14 ENCOUNTER — Emergency Department (HOSPITAL_COMMUNITY)
Admission: EM | Admit: 2023-01-14 | Discharge: 2023-01-14 | Discharge status: Against Medical Advice | Payer: 59 | Source: Home / Self Care | Attending: Emergency Medicine | Admitting: Emergency Medicine

## 2023-01-14 ENCOUNTER — Telehealth: Payer: Self-pay | Admitting: Medical Oncology

## 2023-01-14 DIAGNOSIS — Z87820 Personal history of traumatic brain injury: Secondary | ICD-10-CM | POA: Diagnosis not present

## 2023-01-14 DIAGNOSIS — F17210 Nicotine dependence, cigarettes, uncomplicated: Secondary | ICD-10-CM | POA: Insufficient documentation

## 2023-01-14 DIAGNOSIS — R2242 Localized swelling, mass and lump, left lower limb: Secondary | ICD-10-CM | POA: Insufficient documentation

## 2023-01-14 DIAGNOSIS — I5030 Unspecified diastolic (congestive) heart failure: Secondary | ICD-10-CM | POA: Insufficient documentation

## 2023-01-14 DIAGNOSIS — I10 Essential (primary) hypertension: Secondary | ICD-10-CM | POA: Insufficient documentation

## 2023-01-14 DIAGNOSIS — L03115 Cellulitis of right lower limb: Secondary | ICD-10-CM | POA: Diagnosis not present

## 2023-01-14 DIAGNOSIS — Z1152 Encounter for screening for COVID-19: Secondary | ICD-10-CM | POA: Diagnosis not present

## 2023-01-14 DIAGNOSIS — I700 Atherosclerosis of aorta: Secondary | ICD-10-CM | POA: Diagnosis not present

## 2023-01-14 DIAGNOSIS — J449 Chronic obstructive pulmonary disease, unspecified: Secondary | ICD-10-CM | POA: Insufficient documentation

## 2023-01-14 DIAGNOSIS — M25562 Pain in left knee: Secondary | ICD-10-CM | POA: Insufficient documentation

## 2023-01-14 DIAGNOSIS — J441 Chronic obstructive pulmonary disease with (acute) exacerbation: Secondary | ICD-10-CM | POA: Insufficient documentation

## 2023-01-14 DIAGNOSIS — Z79899 Other long term (current) drug therapy: Secondary | ICD-10-CM | POA: Insufficient documentation

## 2023-01-14 DIAGNOSIS — M7989 Other specified soft tissue disorders: Principal | ICD-10-CM | POA: Insufficient documentation

## 2023-01-14 DIAGNOSIS — I712 Thoracic aortic aneurysm, without rupture, unspecified: Secondary | ICD-10-CM | POA: Diagnosis not present

## 2023-01-14 DIAGNOSIS — J45909 Unspecified asthma, uncomplicated: Secondary | ICD-10-CM | POA: Insufficient documentation

## 2023-01-14 DIAGNOSIS — Z7982 Long term (current) use of aspirin: Secondary | ICD-10-CM | POA: Diagnosis not present

## 2023-01-14 DIAGNOSIS — J9611 Chronic respiratory failure with hypoxia: Secondary | ICD-10-CM | POA: Insufficient documentation

## 2023-01-14 DIAGNOSIS — R569 Unspecified convulsions: Secondary | ICD-10-CM | POA: Insufficient documentation

## 2023-01-14 DIAGNOSIS — I110 Hypertensive heart disease with heart failure: Secondary | ICD-10-CM | POA: Insufficient documentation

## 2023-01-14 DIAGNOSIS — Z86718 Personal history of other venous thrombosis and embolism: Secondary | ICD-10-CM | POA: Diagnosis not present

## 2023-01-14 DIAGNOSIS — C3411 Malignant neoplasm of upper lobe, right bronchus or lung: Secondary | ICD-10-CM | POA: Diagnosis not present

## 2023-01-14 DIAGNOSIS — Z9981 Dependence on supplemental oxygen: Secondary | ICD-10-CM | POA: Insufficient documentation

## 2023-01-14 DIAGNOSIS — Z8673 Personal history of transient ischemic attack (TIA), and cerebral infarction without residual deficits: Secondary | ICD-10-CM | POA: Diagnosis not present

## 2023-01-14 LAB — COMPREHENSIVE METABOLIC PANEL
ALT: 9 U/L (ref 0–44)
AST: 21 U/L (ref 15–41)
Albumin: 3.2 g/dL — ABNORMAL LOW (ref 3.5–5.0)
Alkaline Phosphatase: 72 U/L (ref 38–126)
Anion gap: 11 (ref 5–15)
BUN: 11 mg/dL (ref 6–20)
CO2: 23 mmol/L (ref 22–32)
Calcium: 8.5 mg/dL — ABNORMAL LOW (ref 8.9–10.3)
Chloride: 105 mmol/L (ref 98–111)
Creatinine, Ser: 0.96 mg/dL (ref 0.61–1.24)
GFR, Estimated: >60 mL/min (ref >60)
Glucose, Bld: 82 mg/dL (ref 70–99)
Potassium: 4.4 mmol/L (ref 3.5–5.1)
Sodium: 139 mmol/L (ref 135–145)
Total Bilirubin: 1.2 mg/dL (ref 0.3–1.2)
Total Protein: 6.6 g/dL (ref 6.5–8.1)

## 2023-01-14 LAB — I-STAT VENOUS BLOOD GAS, ED
Acid-Base Excess: 0 mmol/L (ref 0.0–2.0)
Bicarbonate: 25.4 mmol/L (ref 20.0–28.0)
Calcium, Ion: 1.06 mmol/L — ABNORMAL LOW (ref 1.15–1.40)
HCT: 52 % (ref 39.0–52.0)
Hemoglobin: 17.7 g/dL — ABNORMAL HIGH (ref 13.0–17.0)
O2 Saturation: 92 %
Potassium: 4.6 mmol/L (ref 3.5–5.1)
Sodium: 139 mmol/L (ref 135–145)
TCO2: 27 mmol/L (ref 22–32)
pCO2, Ven: 43.9 mmHg — ABNORMAL LOW (ref 44–60)
pH, Ven: 7.37 (ref 7.25–7.43)
pO2, Ven: 67 mmHg — ABNORMAL HIGH (ref 32–45)

## 2023-01-14 LAB — D-DIMER, QUANTITATIVE: D-Dimer, Quant: 1.58 ug/mL-FEU — ABNORMAL HIGH (ref 0.00–0.50)

## 2023-01-14 LAB — TROPONIN I (HIGH SENSITIVITY): Troponin I (High Sensitivity): 12 ng/L (ref <18)

## 2023-01-14 MED ORDER — CEPHALEXIN 500 MG PO CAPS: 500.0000 mg | ORAL_CAPSULE | Freq: Four times a day (QID) | ORAL | 0 refills | Status: DC

## 2023-01-14 MED ORDER — CEPHALEXIN 250 MG PO CAPS
500.0000 mg | ORAL_CAPSULE | Freq: Once | ORAL | Status: AC
  Administered 2023-01-14: 500 mg via ORAL
  Filled 2023-01-14: qty 2

## 2023-01-14 MED ORDER — ENOXAPARIN SODIUM 100 MG/ML IJ SOSY
1.0000 mg/kg | PREFILLED_SYRINGE | Freq: Once | INTRAMUSCULAR | Status: DC
  Filled 2023-01-14: qty 0.95

## 2023-01-14 NOTE — ED Notes (Signed)
This RN reviewed followup instructions with pt. Pt was transported to lobby via wheelchair. NAD noted at this time

## 2023-01-14 NOTE — Discharge Instructions (Addendum)
We evaluated you for your leg swelling.  Your symptoms are most likely caused by a skin infection.  I have prescribed you an antibiotic to take 4 times a day for 7 days.  Please follow-up closely with your primary physician.  Your blood test showed that you may have a blood clot.  We were not able to obtain a ultrasound of your leg today, but we recommended obtaining a CT scan of your chest to look for a blood clot in your lungs.  You did not want to have an IV placed or stay for the CT scan.  Please return to the emergency department if you develop any new symptoms such as chest pain, difficulty breathing, fainting or lightheadedness, nausea or vomiting, high fevers, or any other concerning symptoms.  You can return to the emergency department at any time for repeat evaluation.  We have ordered you an ultrasound for you to come back and have performed tomorrow.  We have ordered a dose of blood thinner to take in the emergency department in case you do have a blood clot in your leg.

## 2023-01-14 NOTE — Telephone Encounter (Signed)
Per 2/1 IB, called both number listed 2x, one number is not valid the other goes to a VM that is full, will try to call again before I leave today

## 2023-01-14 NOTE — ED Triage Notes (Signed)
Pt bib [facility] from home reporting c/o L leg swelling that started a few days ago. BP 160/110, hx HTN. Hx lung CA, 3LNC at baseline. Axo x4

## 2023-01-14 NOTE — Telephone Encounter (Signed)
Pt confirmed his education appt for tomorrow.

## 2023-01-14 NOTE — Telephone Encounter (Signed)
Spoke with pt on 2/1 @ 1550, pt confirmed appt for 2/2 @ 1100

## 2023-01-14 NOTE — Telephone Encounter (Signed)
LM for Richard [name] to call back to the [facility] to r/s his missed education appointment at 0900 today. I

## 2023-01-14 NOTE — Telephone Encounter (Signed)
Reached [name] and he stated that his leg is swollen and he cannot walk. He left a VM message stating he could not come to the class.  Transferred [name] to Dr. Peggye Ley nurse's phone. Also spoke with Dr. Peggye Ley nurse Diane and told her that [name] was trying to reach her.

## 2023-01-14 NOTE — ED Provider Notes (Signed)
[facility] Provider Note  CSN: [id number] Arrival date & time: 01/14/23 1942  Chief Complaint(s) Leg Swelling  HPI Richard [name] is a 52 y.o. male with history of prior DVT, lung cancer presenting to the emergency department with left leg pain.  He reports left leg pain and swelling over the past week.  He denies any trauma.  He reports that it is also red.  No recent travel or surgeries.  Denies any chest pain, shortness of breath.  Reports that he is on his chronic 3 L of oxygen without anything worsening.  No fevers or chills.  No nausea or vomiting.  No abdominal pain.   Past Medical History Past Medical History:  Diagnosis Date   Asthma    Brain bleed ([place])    DVT (deep venous thrombosis) ([place]) 02/19/2015   RLE   GERD (gastroesophageal reflux disease)    History of home oxygen therapy    Hypertension    Seizures ([place])    last episode 03/2013   Stroke [facility])    Patient Active Problem List   Diagnosis Date Noted   Knee pain, left 01/11/2023   Acute hypoxic respiratory failure ([place]) 01/10/2023   Acute bronchiolitis due to respiratory syncytial virus (RSV) 01/10/2023   History of seizures 01/10/2023   Hypertension 12/25/2022   Malignant neoplasm of right lung ([place]) 12/25/2022   Rash 06/04/2022   Chronic respiratory failure with hypoxia ([place]) 06/04/2022   Mediastinal lymphadenopathy    Obstructive pneumonia    Counseling regarding advance care planning and goals of care    Malignant neoplasm of overlapping sites of right lung ([place]) 02/03/2022   COPD exacerbation ([place]) 01/28/2022   Right sided weakness 10/13/2021   Mass of right lung 04/09/2020   Alcohol abuse with intoxication ([place]) 03/04/2020   Hypoxia 03/04/2020   Swollen testicle    Atelectasis    Syncope 08/26/2015   Acute encephalopathy 08/26/2015   Seizure ([place])    Acute pulmonary edema ([place])    Lower leg DVT (deep venous thromboembolism), acute ([place])     Protein-calorie malnutrition, severe ([place]) 02/19/2015   Bacteremia due to Staphylococcus 02/17/2015   Alcohol withdrawal delirium (HCC)    Altered mental status    Metabolic acidosis    Essential hypertension    Encephalopathy 02/11/2015   Alcohol intoxication ([place]) 02/11/2015   Lactic acidosis 02/11/2015   Bleeding from the nose 03/19/2014   TIA (transient ischemic attack) 12/24/2013   Weakness 12/23/2013   Left-sided weakness 12/23/2013   Malignant hypertension 12/23/2013   Seizure disorder ([place]) 12/23/2013   Vomiting 06/18/2013   Chest pain 06/18/2013   Hypertensive urgency 10/30/2012   Migraine variant 10/30/2012   TOBACCO ABUSE 01/11/2008   HYPERTENSION, BENIGN 11/24/2007   CHRONIC FRONTAL SINUSITIS 11/24/2007   Convulsions ([place]) 11/24/2007   Headache(784.0) 11/24/2007   Home Medication(s) Prior to Admission medications   Medication Sig Start Date End Date Taking? Authorizing Provider  cephALEXin (KEFLEX) 500 MG capsule Take 1 capsule (500 mg total) by mouth 4 (four) times daily for 7 days. 01/14/23 01/21/23 Yes Cristie Hem, MD  albuterol (VENTOLIN HFA) 108 (90 Base) MCG/ACT inhaler Inhale 1-2 puffs into the lungs every 6 (six) hours as needed for wheezing or shortness of breath. 06/03/22   Parrett, Fonnie Mu, NP  [name] 160-4.5 MCG/ACT inhaler Inhale 2 puffs into the lungs daily. 12/22/22   [provider]  cloNIDine (CATAPRES) 0.3 MG tablet Take 1 tablet (0.3 mg total) by  mouth 2 (two) times daily. 02/11/22   Florencia Reasons, MD  diclofenac Sodium (VOLTAREN) 1 % GEL Apply 4 g topically 4 (four) times daily. 01/12/23   [name], MD  levETIRAcetam (KEPPRA) 500 MG tablet Take 500 mg by mouth 2 (two) times daily.    [provider]  losartan (COZAAR) 100 MG tablet Take 100 mg by mouth daily. 12/31/22   [provider]  OXYGEN Inhale 2 L/min into the lungs as needed (for shortness of breath).    [provider]  predniSONE (DELTASONE) 20 MG  tablet Take 2 tablets (40 mg total) by mouth daily with breakfast. 01/13/23   [name], MD  prochlorperazine (COMPAZINE) 10 MG tablet Take 1 tablet (10 mg total) by mouth every 6 (six) hours as needed for nausea or vomiting. 01/07/23   Curt Bears, MD  SPIRIVA RESPIMAT 2.5 MCG/ACT AERS Inhale 2 each into the lungs daily. 01/08/23   [provider]                                                                                                                                    Past Surgical History Past Surgical History:  Procedure Laterality Date   BRONCHIAL BIOPSY  02/10/2022   Procedure: BRONCHIAL BIOPSIES;  Surgeon: Rigoberto Noel, MD;  Location: [facility];  Service: Cardiopulmonary;;   BRONCHIAL BRUSHINGS  02/10/2022   Procedure: BRONCHIAL BRUSHINGS;  Surgeon: Rigoberto Noel, MD;  Location: [facility];  Service: Cardiopulmonary;;   BRONCHIAL NEEDLE ASPIRATION BIOPSY  02/10/2022   Procedure: BRONCHIAL NEEDLE ASPIRATION BIOPSIES;  Surgeon: Rigoberto Noel, MD;  Location: [facility];  Service: Cardiopulmonary;;   BRONCHIAL WASHINGS  02/10/2022   Procedure: BRONCHIAL WASHINGS;  Surgeon: Rigoberto Noel, MD;  Location: [facility];  Service: Cardiopulmonary;;   ENDOBRONCHIAL ULTRASOUND Bilateral 02/10/2022   Procedure: ENDOBRONCHIAL ULTRASOUND;  Surgeon: Rigoberto Noel, MD;  Location: [facility];  Service: Cardiopulmonary;  Laterality: Bilateral;   LEG SURGERY     VIDEO BRONCHOSCOPY  02/10/2022   Procedure: VIDEO BRONCHOSCOPY WITHOUT FLUORO;  Surgeon: Rigoberto Noel, MD;  Location: [facility];  Service: Cardiopulmonary;;   Family History Family History  Problem Relation Age of Onset   Cancer Father    Diabetes Mellitus II Sister     Social History Social History   Tobacco Use   Smoking status: Every Day    Packs/day: 1.00    Years: 34.00    Total pack years: 34.00    Types: Cigarettes    Start date: 1987   Smokeless tobacco: Never   Tobacco comments:     1 pack smoked daily [facility] 12/30/22  Vaping Use   Vaping Use: Never used  Substance Use Topics   Alcohol use: Yes    Comment: 01/29/22- last time 6 days ago   Drug use: Not Currently    Types: Cocaine    Comment: 09/2021- last time for cocaine  Allergies Dilaudid [hydromorphone hcl] and Tape  Review of Systems Review of Systems  Physical Exam Vital Signs  I have reviewed the triage vital signs BP (!) 159/113 (BP Location: Left Arm)   Pulse 94   Temp 98.5 F (36.9 C) (Oral)   Resp 20   SpO2 98%  Physical Exam Vitals and nursing note reviewed.  Constitutional:      General: He is not in acute distress.    Appearance: Normal appearance.  HENT:     Mouth/Throat:     Mouth: Mucous membranes are moist.  Eyes:     Conjunctiva/sclera: Conjunctivae normal.  Cardiovascular:     Rate and Rhythm: Normal rate and regular rhythm.  Pulmonary:     Effort: Pulmonary effort is normal. No respiratory distress.     Breath sounds: Normal breath sounds.  Abdominal:     General: Abdomen is flat.     Palpations: Abdomen is soft.     Tenderness: There is no abdominal tenderness.  Musculoskeletal:     Right lower leg: No edema.     Left lower leg: Edema (Mild warmth and erythema to the anterior leg with 2+ pitting edema, mild diffuse tenderness, no eschar, no crepitus) present.  Skin:    General: Skin is warm and dry.     Capillary Refill: Capillary refill takes less than 2 seconds.  Neurological:     Mental Status: He is alert and oriented to person, place, and time. Mental status is at baseline.  Psychiatric:        Mood and Affect: Mood normal.        Behavior: Behavior normal.     ED Results and Treatments Labs (all labs ordered are listed, but only abnormal results are displayed) Labs Reviewed  COMPREHENSIVE METABOLIC PANEL - Abnormal; Notable for the following components:      Result Value   Calcium 8.5 (*)    Albumin 3.2 (*)    All other components within normal limits   CBC WITH DIFFERENTIAL/PLATELET - Abnormal; Notable for the following components:   Platelets 96 (*)    Lymphs Abs 0.6 (*)    All other components within normal limits  D-DIMER, QUANTITATIVE - Abnormal; Notable for the following components:   D-Dimer, Quant 1.58 (*)    All other components within normal limits  I-STAT VENOUS BLOOD GAS, ED - Abnormal; Notable for the following components:   pCO2, Ven 43.9 (*)    pO2, Ven 67 (*)    Calcium, Ion 1.06 (*)    Hemoglobin 17.7 (*)    All other components within normal limits  TROPONIN I (HIGH SENSITIVITY)  TROPONIN I (HIGH SENSITIVITY)                                                                                                                          Radiology DG Chest Portable 1 View  Result Date: 01/14/2023 CLINICAL DATA:  Cough EXAM: PORTABLE CHEST 1 VIEW COMPARISON:  Chest  x-ray 01/09/2023.  CT of the chest 12/25/2022 FINDINGS: Right suprahilar opacity is not well seen secondary to rotation, but is grossly unchanged corresponding to patient's mass on prior CT. There is some new minimal patchy opacities in the left lung base. The heart is mildly enlarged, unchanged. No pleural effusion or pneumothorax. No acute fractures. IMPRESSION: New minimal patchy opacities in the left lung base, which could represent atelectasis or early pneumonia. Otherwise stable exam. Electronically Signed   By: Ronney Asters M.D.   On: 01/14/2023 20:53    Pertinent labs & imaging results that were available during my care of the patient were reviewed by me and considered in my medical decision making (see MDM for details).  Medications Ordered in ED Medications  cephALEXin (KEFLEX) capsule 500 mg (has no administration in time range)  enoxaparin (LOVENOX) injection 95 mg (has no administration in time range)                                                                                                                                     Procedures Ultrasound  ED DVT  Date/Time: 01/14/2023 9:54 PM  Performed by: Cristie Hem, MD Authorized by: Cristie Hem, MD   Procedure details:    Indications: lower extremity pain and swelling of limb     Assessment for:  DVT   Images Archived: Yes     Limitations:  None LLE Findings:    Left common femoral vein:  Compressible   Left deep and superficial femoral veins:  Compressible   Left popliteal vein:  Compressible IMPRESSION:   DVT:     None   (including critical care time)  Medical Decision Making / ED Course   MDM:  52 year old male presenting to the emergency department with left leg pain and swelling.  Patient well-appearing, not hypoxic on chronic 3 L of oxygen.  Mildly hypertensive.  No tachypnea or tachycardia.  Left leg is erythematous, edematous and warm.  Suspect symptoms most likely due to cellulitis.  Patient recently hospitalized, noted to have left leg swelling and edema at that time, had a DVT ultrasound which was negative. Was not previously erythematous per patient. Performed bedside ultrasound here without evidence of obvious DVT.  Patient is extremely high risk for DVT given his history of previous DVT, history of malignancy, obtained D-dimer which was positive, unfortunately past hour for DVT study here so gave dose of Lovenox and ordered outpatient DVT study for tomorrow.  Given positive D-dimer and history of malignancy, recommended obtaining CT angiography of the chest to further evaluate although lower concern for PE and has had negative PE studies recently.  Patient refused and reports that he has to go home to take care of his grandchildren.  He is understanding the risks of leaving [facility] including permanent disability, death, worsening of his medical condition, need to return to the emergency department.  He understands that  he can return to the emergency department at any time.  Will treat for suspected cellulitis with Keflex.  Discussed return  precautions with the patient.      Additional history obtained: -Additional history obtained from ems -External records from outside source obtained and reviewed including: Chart review including previous notes, labs, imaging, consultation notes including recent admission including previous LLE ultrasounsd   Lab Tests: -I ordered, reviewed, and interpreted labs.   The pertinent results include:   Labs Reviewed  COMPREHENSIVE METABOLIC PANEL - Abnormal; Notable for the following components:      Result Value   Calcium 8.5 (*)    Albumin 3.2 (*)    All other components within normal limits  CBC WITH DIFFERENTIAL/PLATELET - Abnormal; Notable for the following components:   Platelets 96 (*)    Lymphs Abs 0.6 (*)    All other components within normal limits  D-DIMER, QUANTITATIVE - Abnormal; Notable for the following components:   D-Dimer, Quant 1.58 (*)    All other components within normal limits  I-STAT VENOUS BLOOD GAS, ED - Abnormal; Notable for the following components:   pCO2, Ven 43.9 (*)    pO2, Ven 67 (*)    Calcium, Ion 1.06 (*)    Hemoglobin 17.7 (*)    All other components within normal limits  TROPONIN I (HIGH SENSITIVITY)  TROPONIN I (HIGH SENSITIVITY)    Notable for normal troponin, elevated d-dimer  EKG   EKG Interpretation  Date/Time:  Thursday January 14 2023 19:49:40 EST Ventricular Rate:  102 PR Interval:  173 QRS Duration: 88 QT Interval:  340 QTC Calculation: 443 R Axis:   48 Text Interpretation: Sinus tachycardia LAE, consider biatrial enlargement Abnormal R-wave progression, early transition Probable left ventricular hypertrophy Confirmed by Garnette Gunner [phone]) on 01/14/2023 8:55:29 PM         Imaging Studies ordered: I ordered imaging studies including CXR On my interpretation imaging demonstrates possible atelectasis, clear lungs without focal findings on clinical exam I independently visualized and interpreted imaging. I agree with  the radiologist interpretation   Medicines ordered and prescription drug management: Meds ordered this encounter  Medications   cephALEXin (KEFLEX) capsule 500 mg   enoxaparin (LOVENOX) injection 95 mg   cephALEXin (KEFLEX) 500 MG capsule    Sig: Take 1 capsule (500 mg total) by mouth 4 (four) times daily for 7 days.    Dispense:  28 capsule    Refill:  0    -I have reviewed the patients home medicines and have made adjustments as needed   Cardiac Monitoring: The patient was maintained on a cardiac monitor.  I personally viewed and interpreted the cardiac monitored which showed an underlying rhythm of: NSR  Social Determinants of Health:  Diagnosis or treatment significantly limited by social determinants of health: polysubstance abuse   Reevaluation: After the interventions noted above, I reevaluated the patient and found that they have improved  Co morbidities that complicate the patient evaluation  Past Medical History:  Diagnosis Date   Asthma    Brain bleed ([place])    DVT (deep venous thrombosis) ([place]) 02/19/2015   RLE   GERD (gastroesophageal reflux disease)    History of home oxygen therapy    Hypertension    Seizures ([place])    last episode 03/2013   Stroke [facility])       Dispostion: Disposition decision including need for hospitalization was considered, and patient Left against medical advice    Final Clinical Impression(s) / ED  Diagnoses Final diagnoses:  Left leg swelling     This chart was dictated using voice recognition software.  Despite best efforts to proofread,  errors can occur which can change the documentation meaning.    Cristie Hem, MD 01/14/23 [id number]

## 2023-01-15 ENCOUNTER — Inpatient Hospital Stay (HOSPITAL_COMMUNITY): Admit: 2023-01-15 | Payer: 59

## 2023-01-15 ENCOUNTER — Other Ambulatory Visit: Payer: Self-pay

## 2023-01-15 ENCOUNTER — Observation Stay (HOSPITAL_COMMUNITY)
Admission: EM | Admit: 2023-01-15 | Discharge: 2023-01-15 | Disposition: A | Discharge status: Home / Self Care | Payer: 59 | Attending: Student in an Organized Health Care Education/Training Program | Admitting: Student in an Organized Health Care Education/Training Program

## 2023-01-15 ENCOUNTER — Observation Stay (HOSPITAL_COMMUNITY): Payer: 59

## 2023-01-15 ENCOUNTER — Emergency Department (HOSPITAL_COMMUNITY): Payer: 59

## 2023-01-15 ENCOUNTER — Inpatient Hospital Stay: Payer: 59

## 2023-01-15 ENCOUNTER — Encounter (HOSPITAL_COMMUNITY): Payer: Self-pay | Admitting: *Deleted

## 2023-01-15 ENCOUNTER — Observation Stay (HOSPITAL_BASED_OUTPATIENT_CLINIC_OR_DEPARTMENT_OTHER)
Admission: EM | Admit: 2023-01-15 | Discharge: 2023-01-17 | Disposition: A | Discharge status: Home / Self Care | Payer: 59 | Source: Home / Self Care | Attending: Student | Admitting: Student

## 2023-01-15 DIAGNOSIS — J441 Chronic obstructive pulmonary disease with (acute) exacerbation: Secondary | ICD-10-CM

## 2023-01-15 DIAGNOSIS — Z8673 Personal history of transient ischemic attack (TIA), and cerebral infarction without residual deficits: Secondary | ICD-10-CM | POA: Insufficient documentation

## 2023-01-15 DIAGNOSIS — Z720 Tobacco use: Secondary | ICD-10-CM | POA: Insufficient documentation

## 2023-01-15 DIAGNOSIS — F17210 Nicotine dependence, cigarettes, uncomplicated: Secondary | ICD-10-CM | POA: Insufficient documentation

## 2023-01-15 DIAGNOSIS — J45909 Unspecified asthma, uncomplicated: Secondary | ICD-10-CM | POA: Insufficient documentation

## 2023-01-15 DIAGNOSIS — Z5111 Encounter for antineoplastic chemotherapy: Secondary | ICD-10-CM | POA: Insufficient documentation

## 2023-01-15 DIAGNOSIS — M7989 Other specified soft tissue disorders: Secondary | ICD-10-CM | POA: Diagnosis not present

## 2023-01-15 DIAGNOSIS — I110 Hypertensive heart disease with heart failure: Secondary | ICD-10-CM | POA: Insufficient documentation

## 2023-01-15 DIAGNOSIS — Z85118 Personal history of other malignant neoplasm of bronchus and lung: Secondary | ICD-10-CM | POA: Insufficient documentation

## 2023-01-15 DIAGNOSIS — Z7982 Long term (current) use of aspirin: Secondary | ICD-10-CM | POA: Insufficient documentation

## 2023-01-15 DIAGNOSIS — C3431 Malignant neoplasm of lower lobe, right bronchus or lung: Secondary | ICD-10-CM | POA: Insufficient documentation

## 2023-01-15 DIAGNOSIS — J9611 Chronic respiratory failure with hypoxia: Secondary | ICD-10-CM | POA: Diagnosis present

## 2023-01-15 DIAGNOSIS — Z86718 Personal history of other venous thrombosis and embolism: Secondary | ICD-10-CM | POA: Insufficient documentation

## 2023-01-15 DIAGNOSIS — M25562 Pain in left knee: Secondary | ICD-10-CM | POA: Diagnosis present

## 2023-01-15 DIAGNOSIS — R2242 Localized swelling, mass and lump, left lower limb: Secondary | ICD-10-CM | POA: Insufficient documentation

## 2023-01-15 DIAGNOSIS — I5030 Unspecified diastolic (congestive) heart failure: Secondary | ICD-10-CM | POA: Insufficient documentation

## 2023-01-15 LAB — CBC WITH DIFFERENTIAL/PLATELET
Abs Immature Granulocytes: 0.08 10*3/uL — ABNORMAL HIGH (ref 0.00–0.07)
Basophils Absolute: 0 10*3/uL (ref 0.0–0.1)
Basophils Relative: 0 %
Eosinophils Absolute: 0 10*3/uL (ref 0.0–0.5)
Eosinophils Relative: 0 %
HCT: 49.7 % (ref 39.0–52.0)
Hemoglobin: 16.6 g/dL (ref 13.0–17.0)
Immature Granulocytes: 1 %
Lymphocytes Relative: 8 %
Lymphs Abs: 0.8 10*3/uL (ref 0.7–4.0)
MCH: 32.7 pg (ref 26.0–34.0)
MCHC: 33.4 g/dL (ref 30.0–36.0)
MCV: 97.8 fL (ref 80.0–100.0)
Monocytes Absolute: 1.2 10*3/uL — ABNORMAL HIGH (ref 0.1–1.0)
Monocytes Relative: 12 %
Neutro Abs: 8.1 10*3/uL — ABNORMAL HIGH (ref 1.7–7.7)
Neutrophils Relative %: 79 %
Platelets: 274 10*3/uL (ref 150–400)
RBC: 5.08 MIL/uL (ref 4.22–5.81)
RDW: 14.6 % (ref 11.5–15.5)
WBC: 10.3 10*3/uL (ref 4.0–10.5)
nRBC: 0 % (ref 0.0–0.2)

## 2023-01-15 LAB — RESP PANEL BY RT-PCR (RSV, FLU A&B, COVID)  RVPGX2
Influenza A by PCR: NEGATIVE
Influenza B by PCR: NEGATIVE
Resp Syncytial Virus by PCR: NEGATIVE
SARS Coronavirus 2 by RT PCR: NEGATIVE

## 2023-01-15 LAB — BASIC METABOLIC PANEL
Anion gap: 12 (ref 5–15)
Anion gap: 12 (ref 5–15)
BUN: 11 mg/dL (ref 6–20)
BUN: 10 mg/dL (ref 6–20)
CO2: 20 mmol/L — ABNORMAL LOW (ref 22–32)
CO2: 19 mmol/L — ABNORMAL LOW (ref 22–32)
Calcium: 8.7 mg/dL — ABNORMAL LOW (ref 8.9–10.3)
Calcium: 8.5 mg/dL — ABNORMAL LOW (ref 8.9–10.3)
Chloride: 104 mmol/L (ref 98–111)
Chloride: 103 mmol/L (ref 98–111)
Creatinine, Ser: 0.85 mg/dL (ref 0.61–1.24)
Creatinine, Ser: 0.84 mg/dL (ref 0.61–1.24)
GFR, Estimated: >60 mL/min (ref >60)
GFR, Estimated: >60 mL/min (ref >60)
Glucose, Bld: 89 mg/dL (ref 70–99)
Glucose, Bld: 126 mg/dL — ABNORMAL HIGH (ref 70–99)
Potassium: 4.2 mmol/L (ref 3.5–5.1)
Potassium: 3.7 mmol/L (ref 3.5–5.1)
Sodium: 136 mmol/L (ref 135–145)
Sodium: 134 mmol/L — ABNORMAL LOW (ref 135–145)

## 2023-01-15 LAB — CBC
HCT: 52.7 % — ABNORMAL HIGH (ref 39.0–52.0)
Hemoglobin: 17.1 g/dL — ABNORMAL HIGH (ref 13.0–17.0)
MCH: 31.7 pg (ref 26.0–34.0)
MCHC: 32.4 g/dL (ref 30.0–36.0)
MCV: 97.6 fL (ref 80.0–100.0)
Platelets: 310 10*3/uL (ref 150–400)
RBC: 5.4 MIL/uL (ref 4.22–5.81)
RDW: 14.7 % (ref 11.5–15.5)
WBC: 10.2 10*3/uL (ref 4.0–10.5)
nRBC: 0 % (ref 0.0–0.2)

## 2023-01-15 LAB — TROPONIN I (HIGH SENSITIVITY): Troponin I (High Sensitivity): 11 ng/L (ref <18)

## 2023-01-15 MED ORDER — IOHEXOL 350 MG/ML SOLN
75.0000 mL | Freq: Once | INTRAVENOUS | Status: AC | PRN
  Administered 2023-01-15: 75 mL via INTRAVENOUS

## 2023-01-15 MED ORDER — AZITHROMYCIN 250 MG PO TABS
500.0000 mg | ORAL_TABLET | Freq: Once | ORAL | Status: AC
  Administered 2023-01-15: 500 mg via ORAL
  Filled 2023-01-15: qty 2

## 2023-01-15 MED ORDER — LEVETIRACETAM 500 MG PO TABS
500.0000 mg | ORAL_TABLET | Freq: Two times a day (BID) | ORAL | Status: DC
  Filled 2023-01-15: qty 1

## 2023-01-15 MED ORDER — LOSARTAN POTASSIUM 50 MG PO TABS
100.0000 mg | ORAL_TABLET | Freq: Every day | ORAL | Status: DC
  Administered 2023-01-16 – 2023-01-17 (×2): 100 mg via ORAL
  Filled 2023-01-15 (×2): qty 2

## 2023-01-15 MED ORDER — CLONIDINE HCL 0.2 MG PO TABS
0.3000 mg | ORAL_TABLET | Freq: Two times a day (BID) | ORAL | Status: DC
  Administered 2023-01-15: 0.3 mg via ORAL
  Filled 2023-01-15: qty 1

## 2023-01-15 MED ORDER — IPRATROPIUM-ALBUTEROL 0.5-2.5 (3) MG/3ML IN SOLN
3.0000 mL | Freq: Once | RESPIRATORY_TRACT | Status: AC
  Administered 2023-01-15: 3 mL via RESPIRATORY_TRACT
  Filled 2023-01-15: qty 3

## 2023-01-15 MED ORDER — ENOXAPARIN SODIUM 40 MG/0.4ML IJ SOSY
40.0000 mg | PREFILLED_SYRINGE | INTRAMUSCULAR | Status: DC
  Administered 2023-01-15: 40 mg via SUBCUTANEOUS
  Filled 2023-01-15: qty 0.4

## 2023-01-15 MED ORDER — PREDNISONE 20 MG PO TABS
40.0000 mg | ORAL_TABLET | Freq: Every day | ORAL | Status: AC
  Administered 2023-01-15: 40 mg via ORAL
  Filled 2023-01-15: qty 2

## 2023-01-15 MED ORDER — CEFAZOLIN SODIUM-DEXTROSE 1-4 GM/50ML-% IV SOLN: 1.0000 g | Freq: Once | INTRAVENOUS | Status: DC

## 2023-01-15 MED ORDER — FLUTICASONE FUROATE-VILANTEROL 200-25 MCG/ACT IN AEPB
1.0000 | INHALATION_SPRAY | Freq: Every day | RESPIRATORY_TRACT | Status: DC
  Administered 2023-01-16 – 2023-01-17 (×2): 1 via RESPIRATORY_TRACT
  Filled 2023-01-15: qty 28

## 2023-01-15 MED ORDER — CLONIDINE HCL 0.2 MG PO TABS
0.3000 mg | ORAL_TABLET | Freq: Two times a day (BID) | ORAL | Status: DC
  Administered 2023-01-15 – 2023-01-17 (×4): 0.3 mg via ORAL
  Filled 2023-01-15 (×4): qty 1

## 2023-01-15 MED ORDER — ENOXAPARIN SODIUM 40 MG/0.4ML IJ SOSY
40.0000 mg | PREFILLED_SYRINGE | INTRAMUSCULAR | Status: DC
  Filled 2023-01-15: qty 0.4

## 2023-01-15 MED ORDER — LOSARTAN POTASSIUM 50 MG PO TABS
100.0000 mg | ORAL_TABLET | Freq: Every day | ORAL | Status: DC
  Administered 2023-01-15: 100 mg via ORAL
  Filled 2023-01-15: qty 2

## 2023-01-15 MED ORDER — SODIUM CHLORIDE 0.9 % IV SOLN
1.0000 g | Freq: Once | INTRAVENOUS | Status: AC
  Administered 2023-01-15: 1 g via INTRAVENOUS
  Filled 2023-01-15: qty 10

## 2023-01-15 MED ORDER — FLUTICASONE FUROATE-VILANTEROL 200-25 MCG/ACT IN AEPB
1.0000 | INHALATION_SPRAY | Freq: Every day | RESPIRATORY_TRACT | Status: DC
  Filled 2023-01-15: qty 28

## 2023-01-15 MED ORDER — TIOTROPIUM BROMIDE MONOHYDRATE 2.5 MCG/ACT IN AERS
2.0000 | INHALATION_SPRAY | Freq: Every day | RESPIRATORY_TRACT | Status: DC
  Filled 2023-01-15: qty 1

## 2023-01-15 MED ORDER — UMECLIDINIUM BROMIDE 62.5 MCG/ACT IN AEPB
1.0000 | INHALATION_SPRAY | Freq: Every day | RESPIRATORY_TRACT | Status: DC
  Administered 2023-01-16 – 2023-01-17 (×2): 1 via RESPIRATORY_TRACT
  Filled 2023-01-15: qty 7

## 2023-01-15 NOTE — H&P (Addendum)
Date: 01/16/2023               Patient Name:  Richard [name] MRN: [id number]  DOB: 07-12-1971 Age / Sex: 52 y.o., male   PCP: [facility], [facility]         Medical Service: Internal Medicine Teaching Service         Attending Physician: Dr. Oswaldo Done, Marquita Palms, *      First Contact: Dr. Marrianne Mood, MD Pager [phone]    Second Contact: Dr. 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  Patient reports that he was playing basketball about 3 days ago, and was doing fine.  He states he went back into the house and sat down, and noticed that his left lower extremity was more swollen. He states it was swollen at his heel and noticed that it was red.  He denied any trauma to the area. He states that this leg swelling progressively got worse, he noticed that his leg has gotten more red.  He denies much pain, but did report that his foot was a little sore.  He states that he continue to walk and that he tried to push through the swelling.  Patient did come to the emergency room to be evaluated yesterday, and was admitted for DVT workup.  Patient left AMA to attend an oncology appointment, and then checked back into the emergency room after the appointment.  He states that he was having trouble walking on his left leg today when he went to his appointment, and had to be wheeled in on a wheelchair.  He states the pain has gotten worse.  He denies any worsening respiratory symptoms.  ED Course: Patient presented back to the emergency room after initially  signing out AMA from the emergency room after being admitted.  Initial vital signs showed patient with temperature 98.1 F, respiratory rate 20, pulse 100, blood pressure 132/88 satting at 98% on 2 L oxygen.  Initial CBC unremarkable.  No new imaging was obtained.  Previous chest x-ray did show possible pneumonia, and EDP did give dose of ceftriaxone in ED.  [facility] was consulted to finish up DVT workup.  Past Medical History Past Medical History:  Diagnosis Date   Asthma    Brain bleed (HCC)    DVT (deep venous thrombosis) (HCC) 02/19/2015   RLE   GERD (gastroesophageal reflux disease)    History of home oxygen therapy    Hypertension    Seizures (HCC)    last episode 03/2013   Stroke (HCC)      Meds: Albuterol 1-2 puffs q6h prn [name] 2 puffs daily Clonidine 0.3 mg twice daily Voltaren gel Keppra 500 mg twice daily Losartan 100 mg daily Prednisone 40 mg-1 dose remaining Spiriva 2 puffs daily  Past Surgical History  Past Surgical History:  Procedure Laterality Date   BRONCHIAL BIOPSY  02/10/2022   Procedure: BRONCHIAL BIOPSIES;  Surgeon: Oretha Milch, MD;  Location: [facility];  Service: Cardiopulmonary;;   BRONCHIAL BRUSHINGS  02/10/2022   Procedure: BRONCHIAL BRUSHINGS;  Surgeon: Oretha Milch, MD;  Location: [facility];  Service: Cardiopulmonary;;   BRONCHIAL NEEDLE ASPIRATION BIOPSY  02/10/2022   Procedure: BRONCHIAL NEEDLE ASPIRATION BIOPSIES;  Surgeon: Oretha Milch, MD;  Location: [facility];  Service: Cardiopulmonary;;   BRONCHIAL WASHINGS  02/10/2022   Procedure: BRONCHIAL WASHINGS;  Surgeon: Oretha Milch, MD;  Location: [facility];  Service: Cardiopulmonary;;   ENDOBRONCHIAL ULTRASOUND Bilateral 02/10/2022   Procedure: ENDOBRONCHIAL ULTRASOUND;  Surgeon: Oretha Milch, MD;  Location: [facility];  Service: Cardiopulmonary;  Laterality: Bilateral;   LEG SURGERY     VIDEO BRONCHOSCOPY  02/10/2022   Procedure: VIDEO BRONCHOSCOPY WITHOUT FLUORO;  Surgeon: Oretha Milch, MD;  Location: [facility];  Service: Cardiopulmonary;;    Social:  Lives With: Wife  Occupation: Retired Insurance claims handler  Support: Firefighter support and makes his own decisions  Level of Function: Independent in all ADLs and IADLs PCP: N/A Substances: 12 pack of beer every weekend, 36 year 3/4ppd smoking history  Family History: N/A  Allergies: Allergies as of 01/15/2023 - Review Complete 01/15/2023  Allergen Reaction Noted   Dilaudid [hydromorphone hcl] Nausea Only and Other (See Comments) 10/30/2012   Tape Itching and Dermatitis 11/06/2019    Review of Systems: A complete ROS was negative except as per HPI.   OBJECTIVE:   Physical Exam: Blood pressure (!) 141/96, pulse 94, temperature 98.4 F (36.9 C), temperature source Oral, resp. rate 18, SpO2 94 %.  Constitutional: Resting in bed, no acute distress with nasal cannula on HENT: normocephalic atraumatic Eyes: conjunctiva non-erythematous Cardiovascular: RRR, no murmurs, rubs, or gallops  Pulmonary/Chest: Normal work of breathing on 3 L oxygen nasal cannula, no wheezing, no rales or rhonchi noted. Neurological: 4/5 strength noted to left lower extremity on hip flexion, dorsiflexion, and plantarflexion decreased sensation throughout Extremity: There is 2+ pitting edema noted to left lower extremity up to left knee.  There is erythema and warmth noted as well.  Left calf tenderness noted, negative Homans' sign.  Right lower extremity unremarkable.   Labs: CBC    Component Value Date/Time   WBC 10.3 01/15/2023 1755   RBC 5.08 01/15/2023 1755   HGB 16.6 01/15/2023 1755   HGB 16.6 01/07/2023 1339   HCT 49.7 01/15/2023 1755   PLT 274 01/15/2023 1755   PLT 264 01/07/2023 1339   MCV 97.8 01/15/2023 1755   MCH 32.7 01/15/2023 1755   MCHC 33.4 01/15/2023 1755   RDW 14.6 01/15/2023 1755   LYMPHSABS 0.8 01/15/2023 1755   MONOABS 1.2 (H) 01/15/2023 1755   EOSABS 0.0 01/15/2023 1755   BASOSABS 0.0 01/15/2023 1755      CMP     Component Value Date/Time   NA 134 (L) 01/15/2023 1755   K 3.7 01/15/2023 1755   CL 103 01/15/2023 1755   CO2 19 (L) 01/15/2023 1755   GLUCOSE 126 (H) 01/15/2023 1755   BUN 10 01/15/2023 1755   CREATININE 0.84 01/15/2023 1755   CREATININE 0.97 01/07/2023 1339   CREATININE 0.91 12/05/2013 1605   CALCIUM 8.5 (L) 01/15/2023 1755   PROT 6.6 01/14/2023 2020   ALBUMIN 3.2 (L) 01/14/2023 2020   AST 21 01/14/2023 2020   AST 14 (L) 01/07/2023 1339   ALT 9 01/14/2023 2020   ALT 13 01/07/2023 1339   ALKPHOS 72 01/14/2023 2020   BILITOT 1.2 01/14/2023 2020   BILITOT 0.4 01/07/2023 1339   GFRNONAA >60 01/15/2023 1755   GFRNONAA >60 01/07/2023 1339  GFRNONAA >89 12/05/2013 1605   GFRAA >60 05/09/2020 0108   GFRAA >89 12/05/2013 1605    Imaging: N/A  EKG: N/A  ASSESSMENT & PLAN:   Assessment & Plan by Problem: Principal Problem:   Left leg swelling   Dvonte Derbyshire is a 52 y.o. male with a past medical history of COPD, previous DVT, hypertension, lung cancer stage IIIb who presents to the emergency department with concerns of leg swelling.  Patient admitted for further evaluation and management of concern of DVT.   #Left lower extremity swelling #Concern for DVT Patient presents with 3-day history of left lower extremity swelling which has progressively gotten worse.  On exam, patient has 2+ pitting edema noted with erythema and warmth to left lower extremity.  Given patient's history of malignancy, most important concern is DVT at this moment.  Patient's DVT had 10 years ago in right lower extremity, and unsure if it was provoked or not.  Most recent DVT seems to be in 2016, in which patient had left lower extremity DVT and was started on Xarelto.  Patient has not had DVT since and was stopped on Xeralto after 6 months.  Of note, on previous hospitalization this year, patient was concerned about left lower extremity edema as well, and DVT was ruled out on 12/26/2022.  Patient had CTA yesterday which ruled out acute PE. Patient was to get a lower extremity ultrasound in which he left AMA before getting to attend to another doctor appointment. Other differential can include cellulitis, but less likely given no wounds noted, normal white count and afebrile.  -Left lower extremity ultrasound pending -If Doppler ultrasound is negative for DVT, consider abdominal and pelvic imaging of the iliac veins -Monitor for worsening symptoms   #Chronic hypoxic respiratory failure #COPD Patient with past medical history of chronic hypoxic respiratory failure on 3 L at home.  On exam, patient has 3 L nasal cannula on satting well.  Patient has no concerns about shortness of breath, increased cough, or increased sputum production.  Patient was recently treated for COPD exacerbation, and improved.  Currently on my exam, patient did not have any wheezing noted and had normal work of breathing. EDP did give ceftriaxone in the ED as previous did show possible pneumonia but in the absence of respiratory symptoms, will hold off on further antibiotic treatment. Patient has completed his prednisone course today. Patient on home [name] 2 puffs daily and Spiriva 2 puffs daily -Continue home triple therapy with hospital equivalent of Breo ellipta and incruse ellipta  -Supplemental oxygen to keep SpO2 between 88 and 92% -Monitor respiratory status  -Discontinue antibiotics    #Right upper lobe lung cancer stage IIIb Patient has a past medical history of right upper lobe lung cancer.  Patient was diagnosed in 2021.  Patient had enlargement in early 2023.  He did have radiation in March 2023 with reduction in main mass to 7.2 cm.  Patient was lost to follow-up in 2023. Patient had CTA on 12/28/2022 with new enlargement of main mass.  There is also a stable satellite mass of 2 cm and interval enlargement of paratracheal lymph node.  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  Plan was to start carboplatin and paclitaxel which patient was to have appointment on 01/15/2023 which he left to go to after signing out of the hospital AMA. Per charting, plan to start chemotherapy on 01/19/2023.  -No acute concerns   #  HFpEF Patient has a past medical history of HFpEF.  Patient has no respiratory symptoms on my exam.  Patient does not seem volume overloaded.  Patient does have good kidney function, and could benefit from SGLT2 inhibitor as it shown to decrease mortality. -No active concern for exacerbation  -Consider starting SGLT2 inhibitor   #History of seizures Patient with past medical history of seizures.  No acute concerns for seizure at this point. -Continue home Keppra 500 mg twice daily   #Hypertension Patient has a past medical history of hypertension.  Patient did present with BP of 132/88.  Will restart home medications. -Resume home losartan 100 mg daily -Resume home clonidine 0.3 mg BID  Diet: Normal VTE: Enoxaparin IVF: None,None Code: Full  Prior to Admission Living Arrangement: Home, living with wife Anticipated Discharge Location: Home Barriers to Discharge: Clinical Improvement   Dispo: Admit patient to Observation with expected length of stay less than 2 midnights.  Signed: Modena Slater, DO Internal Medicine Resident PGY-1 Pager: [phone] 01/16/2023, 1:06 AM    On weekends or after 5pm please page on call intern or resident First contact: [phone] If no answer in 15 minutes, please contact senior pager at [phone]

## 2023-01-15 NOTE — ED Provider Notes (Signed)
[facility] Provider Note  CSN: [id number] Arrival date & time: 01/15/23 1658  Chief Complaint(s) Foot Pain  HPI Ryun Sookram is a 52 y.o. male with PMH right lower extremity DVT currently not on anticoagulation, alcohol abuse, HTN, CVA, seizures, lung cancer who Tama Gander presents the emergency department after leaving AMA for evaluation of left lower extremity swelling.  Patient was recently mated to the internal medicine service last night and left AMA this morning to attend an outpatient oncology appointment.  He went to his oncology appointment and states that he is going to be starting chemotherapy next week and then repeat turns to the emergency department for readmission for his lower extremity swelling and the right lower extremity cellulitis.  He currently denies shortness of breath, chest pain, fever or other systemic symptoms.  Of note, DVT ultrasound not performed yesterday as patient left AMA prior to this occurring.   Past Medical History Past Medical History:  Diagnosis Date   Asthma    Brain bleed ([place])    DVT (deep venous thrombosis) ([place]) 02/19/2015   RLE   GERD (gastroesophageal reflux disease)    History of home oxygen therapy    Hypertension    Seizures ([place])    last episode 03/2013   Stroke [facility])    Patient Active Problem List   Diagnosis Date Noted   Left leg swelling 01/15/2023   Knee pain, left 01/11/2023   Acute hypoxic respiratory failure ([place]) 01/10/2023   Acute bronchiolitis due to respiratory syncytial virus (RSV) 01/10/2023   History of seizures 01/10/2023   Hypertension 12/25/2022   Malignant neoplasm of right lung ([place]) 12/25/2022   Rash 06/04/2022   Chronic respiratory failure with hypoxia ([place]) 06/04/2022   Mediastinal lymphadenopathy    Obstructive pneumonia    Counseling regarding advance care planning and goals of care    Malignant neoplasm of overlapping sites of right lung ([place]) 02/03/2022   COPD  exacerbation ([place]) 01/28/2022   Right sided weakness 10/13/2021   Mass of right lung 04/09/2020   Alcohol abuse with intoxication ([place]) 03/04/2020   Hypoxia 03/04/2020   Swollen testicle    Atelectasis    Syncope 08/26/2015   Acute encephalopathy 08/26/2015   Seizure ([place])    Acute pulmonary edema ([place])    Lower leg DVT (deep venous thromboembolism), acute ([place])    Protein-calorie malnutrition, severe ([place]) 02/19/2015   Bacteremia due to Staphylococcus 02/17/2015   Alcohol withdrawal delirium ([facility])    Altered mental status    Metabolic acidosis    Essential hypertension    Encephalopathy 02/11/2015   Alcohol intoxication ([place]) 02/11/2015   Lactic acidosis 02/11/2015   Bleeding from the nose 03/19/2014   TIA (transient ischemic attack) 12/24/2013   Weakness 12/23/2013   Left-sided weakness 12/23/2013   Malignant hypertension 12/23/2013   Seizure disorder ([place]) 12/23/2013   Vomiting 06/18/2013   Chest pain 06/18/2013   Hypertensive urgency 10/30/2012   Migraine variant 10/30/2012   TOBACCO ABUSE 01/11/2008   HYPERTENSION, BENIGN 11/24/2007   CHRONIC FRONTAL SINUSITIS 11/24/2007   Convulsions ([place]) 11/24/2007   Headache(784.0) 11/24/2007   Home Medication(s) Prior to Admission medications   Medication Sig Start Date End Date Taking? Authorizing Provider  albuterol (VENTOLIN HFA) 108 (90 Base) MCG/ACT inhaler Inhale 1-2 puffs into the lungs every 6 (six) hours as needed for wheezing or shortness of breath. 06/03/22   Parrett, Tammy S, NP  Aspirin-Acetaminophen-Caffeine (GOODY HEADACHE PO) Take 1 packet by mouth in  the morning and at bedtime.    [provider]  [name] 160-4.5 MCG/ACT inhaler Inhale 2 puffs into the lungs daily. 12/22/22   [provider]  cloNIDine (CATAPRES) 0.3 MG tablet Take 1 tablet (0.3 mg total) by mouth 2 (two) times daily. Patient not taking: Reported on 01/15/2023 02/11/22   Florencia Reasons, MD  diclofenac Sodium (VOLTAREN) 1 % GEL Apply 4 g  topically 4 (four) times daily. Patient not taking: Reported on 01/15/2023 01/12/23   [name], MD  levETIRAcetam (KEPPRA) 500 MG tablet Take 500 mg by mouth 2 (two) times daily.    [provider]  losartan (COZAAR) 100 MG tablet Take 100 mg by mouth daily. 12/31/22   [provider]  OXYGEN Inhale 2 L/min into the lungs as needed (for shortness of breath).    [provider]  prochlorperazine (COMPAZINE) 10 MG tablet Take 1 tablet (10 mg total) by mouth every 6 (six) hours as needed for nausea or vomiting. 01/07/23   Curt Bears, MD  SPIRIVA RESPIMAT 2.5 MCG/ACT AERS Inhale 2 each into the lungs daily. 01/08/23   [provider]                                                                                                                                    Past Surgical History Past Surgical History:  Procedure Laterality Date   BRONCHIAL BIOPSY  02/10/2022   Procedure: BRONCHIAL BIOPSIES;  Surgeon: Rigoberto Noel, MD;  Location: [facility];  Service: Cardiopulmonary;;   BRONCHIAL BRUSHINGS  02/10/2022   Procedure: BRONCHIAL BRUSHINGS;  Surgeon: Rigoberto Noel, MD;  Location: [facility];  Service: Cardiopulmonary;;   BRONCHIAL NEEDLE ASPIRATION BIOPSY  02/10/2022   Procedure: BRONCHIAL NEEDLE ASPIRATION BIOPSIES;  Surgeon: Rigoberto Noel, MD;  Location: [facility];  Service: Cardiopulmonary;;   BRONCHIAL WASHINGS  02/10/2022   Procedure: BRONCHIAL WASHINGS;  Surgeon: Rigoberto Noel, MD;  Location: [facility];  Service: Cardiopulmonary;;   ENDOBRONCHIAL ULTRASOUND Bilateral 02/10/2022   Procedure: ENDOBRONCHIAL ULTRASOUND;  Surgeon: Rigoberto Noel, MD;  Location: [facility];  Service: Cardiopulmonary;  Laterality: Bilateral;   LEG SURGERY     VIDEO BRONCHOSCOPY  02/10/2022   Procedure: VIDEO BRONCHOSCOPY WITHOUT FLUORO;  Surgeon: Rigoberto Noel, MD;  Location: [facility];  Service: Cardiopulmonary;;   Family History Family History   Problem Relation Age of Onset   Cancer Father    Diabetes Mellitus II Sister     Social History Social History   Tobacco Use   Smoking status: Every Day    Packs/day: 1.00    Years: 34.00    Total pack years: 34.00    Types: Cigarettes    Start date: 1987   Smokeless tobacco: Never   Tobacco comments:    1 pack smoked daily [facility] 12/30/22  Vaping Use   Vaping Use: Never used  Substance Use Topics   Alcohol use:  Yes    Comment: 01/29/22- last time 6 days ago   Drug use: Not Currently    Types: Cocaine    Comment: 09/2021- last time for cocaine   Allergies Dilaudid [hydromorphone hcl] and Tape  Review of Systems Review of Systems  Cardiovascular:  Positive for leg swelling.    Physical Exam Vital Signs  I have reviewed the triage vital signs BP (!) 141/96   Pulse 94   Temp 98.4 F (36.9 C) (Oral)   Resp 18   SpO2 94%   Physical Exam Vitals and nursing note reviewed.  Constitutional:      General: He is not in acute distress.    Appearance: He is well-developed.  HENT:     Head: Normocephalic and atraumatic.  Eyes:     Conjunctiva/sclera: Conjunctivae normal.  Cardiovascular:     Rate and Rhythm: Normal rate and regular rhythm.     Heart sounds: No murmur heard. Pulmonary:     Effort: Pulmonary effort is normal. No respiratory distress.     Breath sounds: Wheezing present.  Abdominal:     Palpations: Abdomen is soft.     Tenderness: There is no abdominal tenderness.  Musculoskeletal:        General: No swelling.     Cervical back: Neck supple.     Left lower leg: Edema present.  Skin:    General: Skin is warm and dry.     Capillary Refill: Capillary refill takes less than 2 seconds.  Neurological:     Mental Status: He is alert.  Psychiatric:        Mood and Affect: Mood normal.     ED Results and Treatments Labs (all labs ordered are listed, but only abnormal results are displayed) Labs Reviewed  CBC WITH DIFFERENTIAL/PLATELET - Abnormal;  Notable for the following components:      Result Value   Neutro Abs 8.1 (*)    Monocytes Absolute 1.2 (*)    Abs Immature Granulocytes 0.08 (*)    All other components within normal limits  BASIC METABOLIC PANEL - Abnormal; Notable for the following components:   Sodium 134 (*)    CO2 19 (*)    Glucose, Bld 126 (*)    Calcium 8.5 (*)    All other components within normal limits                                                                                                                          Radiology CT Angio Chest Pulmonary Embolism (PE) W or WO Contrast  Result Date: 01/15/2023 CLINICAL DATA:  Right leg swelling and redness. Increasing cough and shortness of breath. Non-small-cell lung cancer. * Tracking Code: BO * EXAM: CT ANGIOGRAPHY CHEST WITH CONTRAST TECHNIQUE: Multidetector CT imaging of the chest was performed using the standard protocol during bolus administration of intravenous contrast. Multiplanar CT image reconstructions and MIPs were obtained to evaluate the vascular anatomy. RADIATION DOSE REDUCTION: This exam was performed  according to the departmental dose-optimization program which includes automated exposure control, adjustment of the mA and/or kV according to patient size and/or use of iterative reconstruction technique. CONTRAST:  49mL OMNIPAQUE IOHEXOL 350 MG/ML SOLN COMPARISON:  12/25/2022 FINDINGS: Cardiovascular: Heart size upper normal to mildly increased. No pericardial effusion. Ascending thoracic aorta measures 4.0 cm diameter. Enlargement of the pulmonary outflow tract/main pulmonary arteries suggests pulmonary arterial hypertension. There is no filling defect within the opacified pulmonary arteries to suggest the presence of an acute pulmonary embolus. Mediastinum/Nodes: Similar mediastinal lymphadenopathy including 12 mm short axis AP window lymph node and 9 mm short axis low right paratracheal node. Similar appearance of the retro hilar right lung mass  measuring 7.7 x 4.2 cm today compared to 7.9 x 4.1 cm previously. This lesion generates mass-effect on the right mainstem bronchus and bronchus intermedius. Lungs/Pleura: Centrilobular and paraseptal emphysema evident. Retro hilar posterior right lung mass, as above. There is bronchial wall thickening and the lower lungs bilaterally with areas of peripheral small airway impaction in the right lower lobe. No suspicious pulmonary nodule or mass in the left lung. No pleural effusion. Upper Abdomen: Stable left adrenal nodule previously characterized as benign adenoma. No followup imaging is recommended. Musculoskeletal: No worrisome lytic or sclerotic osseous abnormality. Review of the MIP images confirms the above findings. IMPRESSION: 1. No CT evidence for acute pulmonary embolus. 2. Enlargement of the pulmonary outflow tract/main pulmonary arteries suggests pulmonary arterial hypertension. 3. Ascending thoracic aorta measuring up to 4.0 cm diameter on today's study. Recommend annual imaging followup by CTA or MRA. This recommendation follows 2010 ACCF/AHA/AATS/ACR/ASA/SCA/SCAI/SIR/STS/SVM Guidelines for the Diagnosis and Management of Patients with Thoracic Aortic Disease. Circulation. 2010; 121: N053-Z767. Aortic aneurysm NOS (ICD10-I71.9) 4. Similar appearance of the known retro hilar lung mass involving the fissure. 5. Similar mediastinal lymphadenopathy. 6.  Emphysema (ICD10-J43.9). Electronically Signed   By: Misty Stanley M.D.   On: 01/15/2023 05:16   DG Chest Portable 1 View  Result Date: 01/14/2023 CLINICAL DATA:  Cough EXAM: PORTABLE CHEST 1 VIEW COMPARISON:  Chest x-ray 01/09/2023.  CT of the chest 12/25/2022 FINDINGS: Right suprahilar opacity is not well seen secondary to rotation, but is grossly unchanged corresponding to patient's mass on prior CT. There is some new minimal patchy opacities in the left lung base. The heart is mildly enlarged, unchanged. No pleural effusion or pneumothorax. No acute  fractures. IMPRESSION: New minimal patchy opacities in the left lung base, which could represent atelectasis or early pneumonia. Otherwise stable exam. Electronically Signed   By: Ronney Asters M.D.   On: 01/14/2023 20:53    Pertinent labs & imaging results that were available during my care of the patient were reviewed by me and considered in my medical decision making (see MDM for details).  Medications Ordered in ED Medications  cefTRIAXone (ROCEPHIN) 1 g in sodium chloride 0.9 % 100 mL IVPB (1 g Intravenous New Bag/Given 01/15/23 1942)  ipratropium-albuterol (DUONEB) 0.5-2.5 (3) MG/3ML nebulizer solution 3 mL (3 mLs Nebulization Given 01/15/23 1944)  Procedures Procedures  (including critical care time)  Medical Decision Making / ED Course   This patient presents to the ED for concern of lower extremity edema, this involves an extensive number of treatment options, and is a complaint that carries with it a high risk of complications and morbidity.  The differential diagnosis includes DVT, COPD exacerbation, pneumonia, cellulitis  MDM: Patient seen in the emergency department for evaluation of lower extremity swelling.  Physical exam reveals significantly asymmetric swelling left greater than right with pitting edema and tenderness to palpation.  Lung exam with mild wheezing.  Laboratory evaluation with no significant leukocytosis, CO2 of 19.  Chest x-ray with stable right hilar mass but no acute findings.  Single DuoNeb given for patient's wheezing and a lower extremity Doppler ultrasound was ordered.  Patient then readmitted to the internal medicine service.  Ceftriaxone reinitiated.   Additional history obtained:  -External records from outside source obtained and reviewed including: Chart review including previous notes, labs, imaging, consultation  notes   Lab Tests: -I ordered, reviewed, and interpreted labs.   The pertinent results include:   Labs Reviewed  CBC WITH DIFFERENTIAL/PLATELET - Abnormal; Notable for the following components:      Result Value   Neutro Abs 8.1 (*)    Monocytes Absolute 1.2 (*)    Abs Immature Granulocytes 0.08 (*)    All other components within normal limits  BASIC METABOLIC PANEL - Abnormal; Notable for the following components:   Sodium 134 (*)    CO2 19 (*)    Glucose, Bld 126 (*)    Calcium 8.5 (*)    All other components within normal limits      Imaging Studies ordered: I ordered imaging studies including chest x-ray I independently visualized and interpreted imaging. I agree with the radiologist interpretation  DVT ultrasound pending  Medicines ordered and prescription drug management: Meds ordered this encounter  Medications   ipratropium-albuterol (DUONEB) 0.5-2.5 (3) MG/3ML nebulizer solution 3 mL   cefTRIAXone (ROCEPHIN) 1 g in sodium chloride 0.9 % 100 mL IVPB    Order Specific Question:   Antibiotic Indication:    Answer:   Cellulitis    -I have reviewed the patients home medicines and have made adjustments as needed  Critical interventions none    Cardiac Monitoring: The patient was maintained on a cardiac monitor.  I personally viewed and interpreted the cardiac monitored which showed an underlying rhythm of: NSR  Social Determinants of Health:  Factors impacting patients care include: none   Reevaluation: After the interventions noted above, I reevaluated the patient and found that they have :stayed the same  Co morbidities that complicate the patient evaluation  Past Medical History:  Diagnosis Date   Asthma    Brain bleed ([place])    DVT (deep venous thrombosis) ([place]) 02/19/2015   RLE   GERD (gastroesophageal reflux disease)    History of home oxygen therapy    Hypertension    Seizures ([place])    last episode 03/2013   Stroke [facility])        Dispostion: I considered admission for this patient, and due to asymmetric swelling and concern for cellulitis, patient require hospital admission     Final Clinical Impression(s) / ED Diagnoses Final diagnoses:  None     @PCDICTATION @    Teressa Lower, MD 01/15/23 2330

## 2023-01-15 NOTE — H&P (Signed)
Date: 01/15/2023               Patient Name:  Richard [name] MRN: [id number]  DOB: [birth year] Age / Sex: 52 y.o., male   PCP: Medicine, [facility]         Medical Service: Internal Medicine Teaching Service         Attending Physician: Dr. Evette Doffing, Mallie Mussel, *      First Contact: Dr. [name], MD Pager [phone]    Second Contact: Dr. Buddy Duty, DO Pager [phone]         After Hours (After 5p/  First Contact Pager: [phone]  weekends / holidays): Second Contact Pager: [phone]   SUBJECTIVE   Chief Complaint: Left leg pain and swelling   History of Present Illness: This is a 52 year old male with a past medical history of COPD, previous provoked DVT, hypertension, lung cancer stage IIIb, COPD on 3-4L O2, history of TBI with seizures on Keppra, who presents to the emergency room with left leg swelling and pain.  Patient reports that he was playing basketball about 2 days ago, and was doing fine.  He states he went back into the house and sat down, and noticed that his left lower extremity was more swollen. He states it was swollen at his heel and noticed that it was red.  He denied any trauma to the area. He states that this leg swelling progressively got worse, he noticed that his leg has gotten more red.  He denies much pain, but did report that his foot was a little sore.  He states that he continue to walk and that he tried to push through the swelling.  He states that it continued to get worse, and went up to his left knee.  He reports feeling warm, and had some chills, but no other complaints.  He denies any worsening cough, shortness of breath, or wheezing.  He states after his recent hospitalization, he was improving.  Of note, patient was recently hospitalized for COPD exacerbation, and discharged on prednisone.  He states he has been finishing his prednisone, and has 1 doses left.  He denies any increased cough, increased shortness of breath, increased sputum  production, or change in color of sputum.  Patient was to follow-up with oncology on 01/15/2023 for preparing for cancer treatment. He does endorse compliance with his medications.   ED Course: Patient initially presented to the emergency room with vital signs showing temperature 98.5 F, blood pressure of 153/108, pulse 94, satting at 98% on 3 L oxygen.  Initial labs showed thrombocytopenia at 96,000, troponin within normal limits, elevated D-dimer at 1.58.  Initial VBG showing pH 7.37, pCO2. Marland Kitchen  EDP did bedside ultrasound which was not revealing of any thrombus in left lower extremity.  Chest x-ray showing new minimal patchy opacities in left lung base representing atelectasis or early pneumonia.  Patient was to get a CTA chest to rule out PE, but patient left to take care of his grandkids.  Patient then returned back for further evaluation.  With concern for pneumonia, EDP started azithromycin and ceftriaxone.  EDP consulted IMTS for further evaluation and management.  Past Medical History COPD, previous DVT, brain hemorrhage, seizures, hypertension Past Medical History:  Diagnosis Date   Asthma    Brain bleed ([place])    DVT (deep venous thrombosis) ([place]) 02/19/2015   RLE   GERD (gastroesophageal reflux disease)    History of home oxygen therapy  Hypertension    Seizures ([place])    last episode 03/2013   Stroke [facility])      Meds:  Albuterol 1-2 puffs q6h prn [name] 2 puffs daily Clonidine 0.3 mg twice daily Voltaren gel Keppra 500 mg twice daily Losartan 100 mg daily Prednisone 40 mg-1 dose remaining Spiriva 2 puffs daily  Past Surgical History  Past Surgical History:  Procedure Laterality Date   BRONCHIAL BIOPSY  02/10/2022   Procedure: BRONCHIAL BIOPSIES;  Surgeon: Rigoberto Noel, MD;  Location: [facility] ENDOSCOPY;  Service: Cardiopulmonary;;   BRONCHIAL BRUSHINGS  02/10/2022   Procedure: BRONCHIAL BRUSHINGS;  Surgeon: Rigoberto Noel, MD;  Location: [facility];  Service: Cardiopulmonary;;    BRONCHIAL NEEDLE ASPIRATION BIOPSY  02/10/2022   Procedure: BRONCHIAL NEEDLE ASPIRATION BIOPSIES;  Surgeon: Rigoberto Noel, MD;  Location: [facility];  Service: Cardiopulmonary;;   BRONCHIAL WASHINGS  02/10/2022   Procedure: BRONCHIAL WASHINGS;  Surgeon: Rigoberto Noel, MD;  Location: [facility];  Service: Cardiopulmonary;;   ENDOBRONCHIAL ULTRASOUND Bilateral 02/10/2022   Procedure: ENDOBRONCHIAL ULTRASOUND;  Surgeon: Rigoberto Noel, MD;  Location: [facility];  Service: Cardiopulmonary;  Laterality: Bilateral;   LEG SURGERY     VIDEO BRONCHOSCOPY  02/10/2022   Procedure: VIDEO BRONCHOSCOPY WITHOUT FLUORO;  Surgeon: Rigoberto Noel, MD;  Location: [facility];  Service: Cardiopulmonary;;    Social:  Lives With: Wife  Occupation: Retired Engineer, manufacturing  Support: Engineer, manufacturing support and makes his own decisions  Level of Function: Independent in all ADLs and IADLs PCP: N/A Substances: 12 pack of beer every weekend, 36 year 3/4ppd smoking history  Family History: N/A  Allergies: Allergies as of 01/14/2023 - Reviewed 01/14/2023  Allergen Reaction Noted   Dilaudid [hydromorphone hcl] Nausea Only and Other (See Comments) 10/30/2012   Tape Itching and Dermatitis 11/06/2019    Review of Systems: A complete ROS was negative except as per HPI.   OBJECTIVE:   Physical Exam: Blood pressure (!) 155/117, pulse (!) 120, temperature 97.9 F (36.6 C), resp. rate 20, SpO2 92 %.  Constitutional: Resting in bed, no acute distress with nasal cannula on HENT: normocephalic atraumatic Eyes: conjunctiva non-erythematous Cardiovascular: Tachycardic, rate of 109, no murmurs, rubs, or gallops Pulmonary/Chest: Normal work of breathing on 3 L oxygen nasal cannula, inspiratory wheezing noted, no rales or rhonchi noted. Neurological: 4/5 strength noted to left lower extremity on hip flexion, dorsiflexion, and plantarflexion decreased sensation throughout Extremity: Left lower extremity with faint pulses  noted, found on Doppler. There is 2+ pitting edema  noted to left lower extremity up to left knee.  There is erythema and warmth noted as well.  Left calf tenderness noted, negative Homans' sign.  Right lower extremity unremarkable.   Labs: CBC    Component Value Date/Time   WBC 10.2 01/15/2023 0348   RBC 5.40 01/15/2023 0348   HGB 17.1 (H) 01/15/2023 0348   HGB 16.6 01/07/2023 1339   HCT 52.7 (H) 01/15/2023 0348   PLT 310 01/15/2023 0348   PLT 264 01/07/2023 1339   MCV 97.6 01/15/2023 0348   MCH 31.7 01/15/2023 0348   MCHC 32.4 01/15/2023 0348   RDW 14.7 01/15/2023 0348   LYMPHSABS 0.6 (L) 01/14/2023 2020   MONOABS 0.6 01/14/2023 2020   EOSABS 0.0 01/14/2023 2020   BASOSABS 0.0 01/14/2023 2020     CMP     Component Value Date/Time   NA 136 01/15/2023 0348   K 4.2 01/15/2023 0348   CL 104 01/15/2023 0348   CO2  20 (L) 01/15/2023 0348   GLUCOSE 89 01/15/2023 0348   BUN 11 01/15/2023 0348   CREATININE 0.85 01/15/2023 0348   CREATININE 0.97 01/07/2023 1339   CREATININE 0.91 12/05/2013 1605   CALCIUM 8.7 (L) 01/15/2023 0348   PROT 6.6 01/14/2023 2020   ALBUMIN 3.2 (L) 01/14/2023 2020   AST 21 01/14/2023 2020   AST 14 (L) 01/07/2023 1339   ALT 9 01/14/2023 2020   ALT 13 01/07/2023 1339   ALKPHOS 72 01/14/2023 2020   BILITOT 1.2 01/14/2023 2020   BILITOT 0.4 01/07/2023 1339   GFRNONAA >60 01/15/2023 0348   GFRNONAA >60 01/07/2023 1339   GFRNONAA >89 12/05/2013 1605   GFRAA >60 05/09/2020 0108   GFRAA >89 12/05/2013 1605    Imaging:  Chest x-ray: showing new minimal patchy opacities in the left lung base, could represent atelectasis or early pneumonia  EKG: personally reviewed my interpretation is sinus tachycardia with a rate of 107.  No acute ST segment changes noted.  When compared to previous ECG, no significant differences.  ASSESSMENT & PLAN:   Assessment & Plan by Problem: Principal Problem:   Left leg swelling   Tryce Scallon is a 52 y.o. male with a  past medical history of COPD, previous DVT, hypertension, lung cancer stage IIIb who presents to the emergency department with concerns of leg swelling.  Patient admitted for further evaluation and management of concern of DVT.  #Left lower extremity swelling #Concern for DVT Patient presents with 2-day history of left lower extremity swelling which has progressively gotten worse.  On exam, patient has 2+ pitting edema noted with erythema and warmth to left lower extremity.  Given patient's history of malignancy, most important concern is DVT at this moment.  Patient's DVT had 10 years ago in right lower extremity, and unsure if it was provoked or not.  Most recent DVT seems to be in 2016, in which patient had left lower extremity DVT and was started on Xarelto.  Patient has not had DVT since and was stopped on Xeralto after 6 months.  Of note, on previous hospitalization this year, patient was concerned about left lower extremity edema as well, and DVT was ruled out on 12/26/2022.  EDP did perform bedside ultrasound which did not show thrombus, but will like to get confirmatory ultrasound.  Other differential can include cellulitis, but less likely given no wounds noted, normal white count and afebrile.  With patient being tachycardic, and elevated D-dimer there is concern for PE, but CTA negative for any acute PE.  -Left lower extremity ultrasound pending -Monitor for worsening symptoms -Hold antibiotics  #Chronic hypoxic respiratory failure #COPD #Chest x-ray findings Patient with past medical history of chronic hypoxic respiratory failure on 3 L at home.  On exam, patient has 3 L nasal cannula on satting well.  Patient has no concerns about shortness of breath, increased cough, or increased sputum production.  Patient was recently treated for COPD exacerbation, and improved.  Currently on my exam, patient did have some inspiratory wheezing but had normal work of breathing and was not in any  respiratory distress. Patient did receive DuoNeb in emergency department.  Will have patient complete prednisone course that he was discharged on while inpatient.  Chest x-ray findings concerning for early pneumonia versus atelectasis.  Clinically, patient does not seem to have much respiratory symptoms, and will hold off on antibiotics. -Continue home Spiriva and Brenya -Supplemental oxygen to keep SpO2 between 88 and 92% -DuoNebs as needed -Hold  antibiotics  -Prednisone 40 mg once to complete 5 day course  #Thrombocytopenia, resolved  Patient presents with CBC showing platelet count of 96,000.  This is decreased from 283,000 about 3 days ago.  Most important concern could be patient is thrombocytopenic in the setting of platelet clumping from possible DVT.  Another etiology could be due to acute infection from cellulitis, but less likely. No concerns for acute bleed at this time. On repeat CBC platelets back up to 310,000, could have been a lab error.  -Obtain smear -Repeat CBC pending -Monitor platelet count  #Right upper lobe lung cancer stage IIIb Patient has a past medical history of right upper lobe lung cancer.  Patient was diagnosed in 2021.  Patient had enlargement in early 2023.  He did have radiation in March 2023 with reduction in main mass to 7.2 cm.  Patient was lost to follow-up in 2023.  Patient had CTA on 12/28/2022 with new enlargement of main mass.  There is also a stable satellite mass of 2 cm and interval enlargement of paratracheal lymph node.  At that time, per charting, cardiology and pulmonology were consulted and workup showed poorly differentiated carcinoma with neuroendocrine features.  Plan was to start carboplatin and paclitaxel which patient is to have appointment on 01/15/2023 to evaluate for port to start treatment. -No acute concerns -Will need to reach out to oncology office for rescheduling appointment  #HFpEF Patient has a past medical history of HFpEF.   Patient has no respiratory symptoms on my exam.  Patient does not seem volume overloaded.  Patient does have good kidney function, and could benefit from SGLT2 inhibitor as it shown to decrease mortality. -No active concern for exacerbation  -Consider starting SGLT2 inhibitor  #History of seizures Patient with past medical history of seizures.  No acute concerns for seizure at this point. -Continue home Keppra 500 mg twice daily  #Hypertension Patient has a past medical history of hypertension.  Patient did present hypertensive with initial blood pressure at 155/117.  Blood pressures currently elevated at 149/108.  Will restart home medications. -Resume home losartan 100 mg daily -Resume home clonidine 0.3 mg BID  Diet: Normal VTE: Enoxaparin IVF: None,None Code: Full  Prior to Admission Living Arrangement: Home, living with wife Anticipated Discharge Location: Home Barriers to Discharge: Clinical improvement  Dispo: Admit patient to Observation with expected length of stay less than 2 midnights.  Signed: Leigh Aurora, DO Internal Medicine Resident PGY-1 Pager: [phone] 01/15/2023, 6:40 AM

## 2023-01-15 NOTE — ED Provider Notes (Signed)
[place] Provider Note   CSN: [id number] Arrival date & time: 01/14/23  2312     History  Chief Complaint  Patient presents with   Leg Swelling    Richard [name] is a 52 y.o. male.  HPI Patient presents to the ER because "they told me to come back to the ER" Patient reports left leg pain and swelling for the past several days.  He also reports increasing cough and shortness of breath    Home Medications Prior to Admission medications   Medication Sig Start Date End Date Taking? Authorizing Provider  albuterol (VENTOLIN HFA) 108 (90 Base) MCG/ACT inhaler Inhale 1-2 puffs into the lungs every 6 (six) hours as needed for wheezing or shortness of breath. 06/03/22   Parrett, Fonnie Mu, NP  [name] 160-4.5 MCG/ACT inhaler Inhale 2 puffs into the lungs daily. 12/22/22   [provider]  cephALEXin (KEFLEX) 500 MG capsule Take 1 capsule (500 mg total) by mouth 4 (four) times daily for 7 days. 01/14/23 01/21/23  Cristie Hem, MD  cloNIDine (CATAPRES) 0.3 MG tablet Take 1 tablet (0.3 mg total) by mouth 2 (two) times daily. 02/11/22   Florencia Reasons, MD  diclofenac Sodium (VOLTAREN) 1 % GEL Apply 4 g topically 4 (four) times daily. 01/12/23   [name], MD  levETIRAcetam (KEPPRA) 500 MG tablet Take 500 mg by mouth 2 (two) times daily.    [provider]  losartan (COZAAR) 100 MG tablet Take 100 mg by mouth daily. 12/31/22   [provider]  OXYGEN Inhale 2 L/min into the lungs as needed (for shortness of breath).    [provider]  predniSONE (DELTASONE) 20 MG tablet Take 2 tablets (40 mg total) by mouth daily with breakfast. 01/13/23   [name], MD  prochlorperazine (COMPAZINE) 10 MG tablet Take 1 tablet (10 mg total) by mouth every 6 (six) hours as needed for nausea or vomiting. 01/07/23   Curt Bears, MD  SPIRIVA RESPIMAT 2.5 MCG/ACT AERS Inhale 2 each into the lungs daily. 01/08/23   [provider]      Allergies    Dilaudid [hydromorphone hcl] and Tape    Review of Systems   Review of Systems  Respiratory:  Positive for cough and shortness of breath.   Cardiovascular:  Positive for leg swelling.    Physical Exam Updated Vital Signs BP (!) 154/106   Pulse (!) 120   Temp 97.9 F (36.6 C)   Resp (!) 36   SpO2 92%  Physical Exam CONSTITUTIONAL: Ill-appearing, appears older than stated age HEAD: Normocephalic/atraumatic EYES: EOMI/PERRL ENMT: Mucous membranes moist NECK: supple no meningeal signs CV: S1/S2 noted, tachycardic LUNGS: Coughing throughout exam, wheezing bilaterally ABDOMEN: soft NEURO: Pt is awake/alert/appropriate, moves all extremitiesx4.  No facial droop.   EXTREMITIES: no crepitus, no bruising.  Diffuse tenderness to left LE.  The legs are warm to touch Left leg with extensive pitting edema No significant edema to right LE SKIN: warm, color normal PSYCH: Anxious ED Results / Procedures / Treatments   Labs (all labs ordered are listed, but only abnormal results are displayed) Labs Reviewed  RESP PANEL BY RT-PCR (RSV, FLU A&B, COVID)  [id number]    EKG EKG Interpretation  Date/Time:  Friday January 15 2023 02:44:21 EST Ventricular Rate:  107 PR Interval:  174 QRS Duration: 90 QT Interval:  330 QTC Calculation: 441 R Axis:   54 Text Interpretation: Sinus tachycardia Biatrial enlargement Probable  left ventricular hypertrophy Confirmed by Ripley Fraise [phone]) on 01/15/2023 2:56:07 AM  Radiology DG Chest Portable 1 View  Result Date: 01/14/2023 CLINICAL DATA:  Cough EXAM: PORTABLE CHEST 1 VIEW COMPARISON:  Chest x-ray 01/09/2023.  CT of the chest 12/25/2022 FINDINGS: Right suprahilar opacity is not well seen secondary to rotation, but is grossly unchanged corresponding to patient's mass on prior CT. There is some new minimal patchy opacities in the left lung base. The heart is mildly enlarged, unchanged. No pleural effusion or  pneumothorax. No acute fractures. IMPRESSION: New minimal patchy opacities in the left lung base, which could represent atelectasis or early pneumonia. Otherwise stable exam. Electronically Signed   By: Ronney Asters M.D.   On: 01/14/2023 20:53    Procedures Procedures    Medications Ordered in ED Medications  cefTRIAXone (ROCEPHIN) 1 g in sodium chloride 0.9 % 100 mL IVPB (1 g Intravenous New Bag/Given 01/15/23 0242)  ipratropium-albuterol (DUONEB) 0.5-2.5 (3) MG/3ML nebulizer solution 3 mL (3 mLs Nebulization Given 01/15/23 0246)  azithromycin (ZITHROMAX) tablet 500 mg (500 mg Oral Given 01/15/23 0244)    ED Course/ Medical Decision Making/ A&P Clinical Course as of 01/15/23 0301  Fri Jan 15, 2023  2774 Patient was just seen in the ER and left against advice.  He had shortness of breath and also left leg swelling concern for cellulitis versus DVT.  On my exam patient is coughing, wheezing and short of breath.  Will call for admission [DW]  0300 Patient is short of breath seems to be driving more from COPD exacerbation.  Will defer CT chest for now.  Will treat for presumed cellulitis to left lower extremity, would likely need confirmatory DVT study later in the morning [DW]    Clinical Course User Index [DW] Ripley Fraise, MD                             Medical Decision Making Amount and/or Complexity of Data Reviewed ECG/medicine tests: ordered.  Risk Prescription drug management. Decision regarding hospitalization.   This patient presents to the ED for concern of cough and shortness of breath and leg swelling, this involves an extensive number of treatment options, and is a complaint that carries with it a high risk of complications and morbidity.  The differential diagnosis includes but is not limited to Acute coronary syndrome, pneumonia, acute pulmonary edema, pneumothorax, acute anemia, pulmonary embolism Cellulitis could be cause of leg swelling, DVT could be cause of leg  swelling  Comorbidities that complicate the patient evaluation: Patient's presentation is complicated by their history of lung CA, COPD  Social Determinants of Health: Patient's  multiple ER visits, smoking use   increases the complexity of managing their presentation  Additional history obtained: Records reviewed previous admission documents   Imaging Studies ordered: I ordered imaging studies including X-ray chest   I independently visualized and interpreted imaging which showed pneumonia I agree with the radiologist interpretation  Cardiac Monitoring: The patient was maintained on a cardiac monitor.  I personally viewed and interpreted the cardiac monitor which showed an underlying rhythm of:  sinus tachycardia  Medicines ordered and prescription drug management: I ordered medication including nebulized therapies  for wheezing  Reevaluation of the patient after these medicines showed that the patient    stayed the same    Critical Interventions:  admission  Consultations Obtained: I requested consultation with the admitting physician IM residents , and discussed  findings as  well as pertinent plan - they recommend: will admit  Reevaluation: After the interventions noted above, I reevaluated the patient and found that they have :stayed the same  Complexity of problems addressed: Patient's presentation is most consistent with  acute presentation with potential threat to life or bodily function  Disposition: After consideration of the diagnostic results and the patient's response to treatment,  I feel that the patent would benefit from admission   .           Final Clinical Impression(s) / ED Diagnoses Final diagnoses:  COPD exacerbation ([place])  Leg swelling    Rx / DC Orders ED Discharge Orders     None         Ripley Fraise, MD 01/15/23 3601

## 2023-01-15 NOTE — ED Notes (Signed)
Patient in his room complaining that we were going to kill him here when he needs to be at [facility], that he was leaving, patient is very loud and rude, requested his IV out.

## 2023-01-15 NOTE — Progress Notes (Signed)
                 Interval history Left leg is very painful. He clarifies that he was playing with his grandchildren before the swelling started. He did not injure his leg as far as he knows. He reports no recent cuts or scrapes to cause a break in the skin of the affected leg. Review of systems notable for some chills and sweats overnight since swelling started. His breathing is much improved since discharge from the hospital a couple of days ago.   Physical exam Blood pressure (!) 148/93, pulse (!) 110, temperature 98.3 F (36.8 C), temperature source Oral, resp. rate 17, height 5\' 9"  (1.753 m), weight 94.3 kg, SpO2 (!) 88 %.  Comfortable appearing, sitting on the edge of the bed Heart rate and rhythm are regular, left radial pulse 2+ Breathing is regular and unlabored, scant expiratory wheeze left lower lung Skin is warm and dry Left lower extremity with pitting edema to the level of the knee, tenderness, some increased warmth relative to the right leg, erythema not visualized Alert and oriented, no gross focal neurologic deficits Pleasant, mood and affect are concordant  Assessment and plan Hospital day 0  Richard [name] is a 52 y.o. with lung cancer admitted for left lower extremity pain and swelling.  Principal Problem:   Left leg swelling Active Problems:   Chronic respiratory failure with hypoxia (HCC)   Knee pain, left  Left lower extremity swelling The leg is grossly swollen.  Exam is notable for pitting edema, tenderness, and warmth but no appreciable erythema compared to the right leg.  D-dimer was elevated.  Bedside DVT study in the ED overnight was negative.  Will need formal lower extremity Doppler to evaluate for DVT given recent hospitalization and lung cancer.  CTA of the chest showed no pulmonary embolism.  If ultrasound is negative, consider imaging of iliacs. Patient is otherwise without concerns.  Differential includes cellulitis but without erythema this diagnosis is  less likely.  Also afebrile and without leukocytosis. - Follow-up left lower extremity Doppler ultrasound study  COPD No exacerbation.  Breathing is at baseline.  Will finish his 5-day course of prednisone from his last hospitalization this morning.  Continue home inhalers. - Prednisone 40 mg for 1 dose - Breo Ellipta - Tiotropium bromide  Hypertension BP 148/93.  Continue home antihypertensives. - Clonidine 0.3 mg p.o. twice daily - Losartan 100 mg  History of seizures Continue home anticonvulsants. - Levetiracetam 500 mg twice daily  Diet: Regular IVF: None VTE: Enoxaparin Code: Full  [name] MD 01/15/2023, 9:35 AM  Pager: [phone] After 5pm on weekdays and 1pm on weekends: [phone]

## 2023-01-15 NOTE — Discharge Summary (Signed)
Name: Richard [name] MRN: [id number] DOB: 12-25-1970 52 y.o. PCP: Medicine, [facility]  Date of Admission: 01/15/2023 12:27 AM Date of AMA Discharge: 01/15/2023 01/15/2023 Attending Physician: Dr. Evette Doffing  DISCHARGE DIAGNOSIS:  Primary Problem: Left leg swelling   Hospital Problems: Principal Problem:   Left leg swelling Active Problems:   Chronic respiratory failure with hypoxia (HCC)   Knee pain, left    DISCHARGE MEDICATIONS:   Allergies as of 01/15/2023       Reactions   Dilaudid [hydromorphone Hcl] Nausea Only, Other (See Comments)   Bradycardia and hyperthermia, too   Tape Itching, Dermatitis        Medication List     STOP taking these medications    cephALEXin 500 MG capsule Commonly known as: KEFLEX   predniSONE 20 MG tablet Commonly known as: DELTASONE       TAKE these medications    albuterol 108 (90 Base) MCG/ACT inhaler Commonly known as: VENTOLIN HFA Inhale 1-2 puffs into the lungs every 6 (six) hours as needed for wheezing or shortness of breath.   [name] 160-4.5 MCG/ACT inhaler Generic drug: budesonide-formoterol Inhale 2 puffs into the lungs daily.   cloNIDine 0.3 MG tablet Commonly known as: CATAPRES Take 1 tablet (0.3 mg total) by mouth 2 (two) times daily.   diclofenac Sodium 1 % Gel Commonly known as: VOLTAREN Apply 4 g topically 4 (four) times daily.   GOODY HEADACHE PO Take 1 packet by mouth in the morning and at bedtime.   levETIRAcetam 500 MG tablet Commonly known as: KEPPRA Take 500 mg by mouth 2 (two) times daily.   losartan 100 MG tablet Commonly known as: COZAAR Take 100 mg by mouth daily.   OXYGEN Inhale 2 L/min into the lungs as needed (for shortness of breath).   prochlorperazine 10 MG tablet Commonly known as: COMPAZINE Take 1 tablet (10 mg total) by mouth every 6 (six) hours as needed for nausea or vomiting.   Spiriva Respimat 2.5 MCG/ACT Aers Generic drug: Tiotropium Bromide  Monohydrate Inhale 2 each into the lungs daily.        DISPOSITION AND FOLLOW-UP:  Mr.Richard [name] LEFT AMA from [facility] in [place] condition. At the hospital follow up visit please address:  Left lower extremity swelling: left hospital prior to formal doppler ultrasound of left lower extremity to rule out DVT. If that is obtained and negative, consider abdominal and pelvic imaging of iliac veins   Chronic hypoxic respiratory failure: Recovered from recent COPD exacerbation and has no signs of pneumonia, left AMA on his 3 L [place].   Follow-up Recommendations: Consults: none Labs:  none Studies: Doppler ultrasound left lower extremity New Medications: none  Follow-up Appointments:  Follow-up Information     Medicine, [facility] Follow up.   Specialty: Family Medicine Contact information: [place] [address] [phone]               1. Oncology appt today at [place]:  Patient Summary: #Left lower extremity swelling #Concern for DVT Patient presented with 2-day history of left lower extremity swelling which has progressively gotten worse.  On exam, patient has 2+ edema noted with erythema and warmth to left lower extremity.  Patient is at risk for DVT given recent hospitalization, and active lung cancer diagnosis. He had a  DVT 10 years ago in right lower extremity, and unsure if it was provoked or not.  Most recent DVT  seems to be in 2016, in which patient had left lower extremity DVT and was started on Xarelto.  Patient has not had DVT since and was stopped on Xaralto after 6 months.  Of note, on previous hospitalization this year, patient was concerned about left lower extremity edema as well, and DVT was ruled out on 12/26/2022.  EDP did perform bedside ultrasound which did not show thrombus, but we ordered formal left lower extremity vascular ultrasound to rule out DVT, however, patient left AMA prior to  this being done. Other differential did include cellulitis, but less likely given no wounds noted, normal white count and afebrile. Overall, no signs of skin or soft tissue infection in the leg. No injury to the knee or ankle, x-rays have been negative for fracture With the patient being tachycardic and having an elevated D-dimer, there was concern for PE, however, CTA was negative for this.  If doppler ultrasound is performed at later date, and is negative for DVT, would consider abdominal and pelvic imaging of the iliac veins, as metastatic disease could be causing a more proximal obstruction.   #Chronic hypoxic respiratory failure #COPD #Chest x-ray findings Patient with past medical history of chronic hypoxic respiratory failure on 3 L at home.  On exam, patient has 3 L nasal cannula on satting well and actually had taken off his supplemental O2 in the morning.  Patient has no concerns about shortness of breath, increased cough, or increased sputum production. Patient was recently treated for COPD exacerbation, and recovered from this. Patient did receive DuoNeb in emergency department.  Chest x-ray findings were concerning for early pneumonia versus atelectasis, however, clinically, the patient does not seem to have much respiratory symptoms, and we held off on antibiotics. He does not need further antibiotics or steroids.   #Right upper lobe lung cancer stage IIIb Patient has a past medical history of right upper lobe lung cancer.  Patient was diagnosed in 2021.  Patient had enlargement in early 2023.  He did have radiation in March 2023 with reduction in main mass to 7.2 cm.  Patient was lost to follow-up in 2023.  Patient had CTA on 12/28/2022 with new enlargement of main mass.  There is also a stable satellite mass of 2 cm and interval enlargement of paratracheal lymph node.  At that time, per charting, cardiology and pulmonology were consulted and workup showed poorly differentiated carcinoma with  neuroendocrine features.  Plan was to start carboplatin and paclitaxel which patient is to have appointment on 01/15/2023 to evaluate for port to start treatment. Patient left AMA so he could make it to this appointment.   #HFpEF Patient has a past medical history of HFpEF.  Patient has no respiratory symptoms on my exam.  Patient does not seem volume overloaded.  Patient does have good kidney function, and could benefit from SGLT2 inhibitor as it shown to decrease mortality.  #Seizures Continued home keppra 500 mg bid.  #Hypertension Patient has a past medical history of hypertension.  Patient did present hypertensive with initial blood pressure at 155/117.  Resumed home losartan and clonidine   DISCHARGE INSTRUCTIONS:  No discharge instructions were able to be provided to the patient prior to him leaving AMA.   SUBJECTIVE:  Patient was seen and examined on the day of discharge. He has some tenderness in his left lower extremity and denies any injury/trauma to the leg. He was breathing comfortably on room air and denies any SOB or cough.   Discharge Vitals:  BP (!) 148/93 (BP Location: Right Arm)   Pulse (!) 110   Temp 98.3 F (36.8 C) (Oral)   Resp 17   Ht 5\' 9"  (1.753 m)   Wt 94.3 kg   SpO2 (!) 88%   BMI 30.70 kg/m   OBJECTIVE:  Please see physical exam from progress note written today.  Pertinent Labs, Studies, and Procedures:     Latest Ref Rng & Units 01/15/2023    3:48 AM 01/14/2023    8:48 PM 01/14/2023    8:20 PM  CBC  WBC 4.0 - 10.5 K/uL 10.2   6.8   Hemoglobin 13.0 - 17.0 g/dL 17.1  17.7  16.5   Hematocrit 39.0 - 52.0 % 52.7  52.0  49.1   Platelets 150 - 400 K/uL 310   96        Latest Ref Rng & Units 01/15/2023    3:48 AM 01/14/2023    8:48 PM 01/14/2023    8:20 PM  CMP  Glucose 70 - 99 mg/dL 89   82   BUN 6 - 20 mg/dL 11   11   Creatinine 0.61 - 1.24 mg/dL 0.85   0.96   Sodium 135 - 145 mmol/L 136  139  139   Potassium 3.5 - 5.1 mmol/L 4.2  4.6  4.4    Chloride 98 - 111 mmol/L 104   105   CO2 22 - 32 mmol/L 20   23   Calcium 8.9 - 10.3 mg/dL 8.7   8.5   Total Protein 6.5 - 8.1 g/dL   6.6   Total Bilirubin 0.3 - 1.2 mg/dL   1.2   Alkaline Phos 38 - 126 U/L   72   AST 15 - 41 U/L   21   ALT 0 - 44 U/L   9     CT Angio Chest Pulmonary Embolism (PE) W or WO Contrast  Result Date: 01/15/2023 CLINICAL DATA:  Right leg swelling and redness. Increasing cough and shortness of breath. Non-small-cell lung cancer. * Tracking Code: BO * EXAM: CT ANGIOGRAPHY CHEST WITH CONTRAST TECHNIQUE: Multidetector CT imaging of the chest was performed using the standard protocol during bolus administration of intravenous contrast. Multiplanar CT image reconstructions and MIPs were obtained to evaluate the vascular anatomy. RADIATION DOSE REDUCTION: This exam was performed according to the departmental dose-optimization program which includes automated exposure control, adjustment of the mA and/or kV according to patient size and/or use of iterative reconstruction technique. CONTRAST:  62mL OMNIPAQUE IOHEXOL 350 MG/ML SOLN COMPARISON:  12/25/2022 FINDINGS: Cardiovascular: Heart size upper normal to mildly increased. No pericardial effusion. Ascending thoracic aorta measures 4.0 cm diameter. Enlargement of the pulmonary outflow tract/main pulmonary arteries suggests pulmonary arterial hypertension. There is no filling defect within the opacified pulmonary arteries to suggest the presence of an acute pulmonary embolus. Mediastinum/Nodes: Similar mediastinal lymphadenopathy including 12 mm short axis AP window lymph node and 9 mm short axis low right paratracheal node. Similar appearance of the retro hilar right lung mass measuring 7.7 x 4.2 cm today compared to 7.9 x 4.1 cm previously. This lesion generates mass-effect on the right mainstem bronchus and bronchus intermedius. Lungs/Pleura: Centrilobular and paraseptal emphysema evident. Retro hilar posterior right lung mass, as  above. There is bronchial wall thickening and the lower lungs bilaterally with areas of peripheral small airway impaction in the right lower lobe. No suspicious pulmonary nodule or mass in the left lung. No pleural effusion. Upper Abdomen: Stable left adrenal nodule previously  characterized as benign adenoma. No followup imaging is recommended. Musculoskeletal: No worrisome lytic or sclerotic osseous abnormality. Review of the MIP images confirms the above findings. IMPRESSION: 1. No CT evidence for acute pulmonary embolus. 2. Enlargement of the pulmonary outflow tract/main pulmonary arteries suggests pulmonary arterial hypertension. 3. Ascending thoracic aorta measuring up to 4.0 cm diameter on today's study. Recommend annual imaging followup by CTA or MRA. This recommendation follows 2010 ACCF/AHA/AATS/ACR/ASA/SCA/SCAI/SIR/STS/SVM Guidelines for the Diagnosis and Management of Patients with Thoracic Aortic Disease. Circulation. 2010; 121: D924-Q683. Aortic aneurysm NOS (ICD10-I71.9) 4. Similar appearance of the known retro hilar lung mass involving the fissure. 5. Similar mediastinal lymphadenopathy. 6.  Emphysema (ICD10-J43.9). Electronically Signed   By: Misty Stanley M.D.   On: 01/15/2023 05:16     Signed: Buddy Duty, D.O.  Internal Medicine Resident, PGY-2 [facility]  Pager: [phone] 10:39 AM, 01/15/2023

## 2023-01-15 NOTE — ED Triage Notes (Signed)
Patient returns after leaving against medical advice earlier today and states he would like to complete his treatment plan from before leaving. Patient is alert, oriented, and in no apparent distress at this time.

## 2023-01-15 NOTE — Hospital Course (Addendum)
#Left lower extremity swelling #Concern for DVT Patient presented with 2-day history of left lower extremity swelling which has progressively gotten worse.  On exam, patient has 2+ edema noted with erythema and warmth to left lower extremity.  Patient is at risk for DVT given recent hospitalization, and active lung cancer diagnosis. He had a  DVT 10 years ago in right lower extremity, and unsure if it was provoked or not.  Most recent DVT seems to be in 2016, in which patient had left lower extremity DVT and was started on Xarelto.  Patient has not had DVT since and was stopped on Xaralto after 6 months.  Of note, on previous hospitalization this year, patient was concerned about left lower extremity edema as well, and DVT was ruled out on 12/26/2022.  EDP did perform bedside ultrasound which did not show thrombus, but we ordered formal left lower extremity vascular ultrasound to rule out DVT, however, patient left AMA prior to this being done. Other differential did include cellulitis, but less likely given no wounds noted, normal white count and afebrile. Overall, no signs of skin or soft tissue infection in the leg. No injury to the knee or ankle, x-rays have been negative for fracture With the patient being tachycardic and having an elevated D-dimer, there was concern for PE, however, CTA was negative for this.  If doppler ultrasound is performed at later date, and is negative for DVT, would consider abdominal and pelvic imaging of the iliac veins, as metastatic disease could be causing a more proximal obstruction.   #Chronic hypoxic respiratory failure #COPD #Chest x-ray findings Patient with past medical history of chronic hypoxic respiratory failure on 3 L at home.  On exam, patient has 3 L nasal cannula on satting well and actually had taken off his supplemental O2 in the morning.  Patient has no concerns about shortness of breath, increased cough, or increased sputum production. Patient was recently  treated for COPD exacerbation, and recovered from this. Patient did receive DuoNeb in emergency department.  Chest x-ray findings were concerning for early pneumonia versus atelectasis, however, clinically, the patient does not seem to have much respiratory symptoms, and we held off on antibiotics. He does not need further antibiotics or steroids.   #Right upper lobe lung cancer stage IIIb Patient has a past medical history of right upper lobe lung cancer.  Patient was diagnosed in 2021.  Patient had enlargement in early 2023.  He did have radiation in March 2023 with reduction in main mass to 7.2 cm.  Patient was lost to follow-up in 2023.  Patient had CTA on 12/28/2022 with new enlargement of main mass.  There is also a stable satellite mass of 2 cm and interval enlargement of paratracheal lymph node.  At that time, per charting, cardiology and pulmonology were consulted and workup showed poorly differentiated carcinoma with neuroendocrine features.  Plan was to start carboplatin and paclitaxel which patient is to have appointment on 01/15/2023 to evaluate for port to start treatment. Patient left AMA so he could make it to this appointment.   #HFpEF Patient has a past medical history of HFpEF.  Patient has no respiratory symptoms on my exam.  Patient does not seem volume overloaded.  Patient does have good kidney function, and could benefit from SGLT2 inhibitor as it shown to decrease mortality.  #Seizures Continued home keppra 500 mg bid.  #Hypertension Patient has a past medical history of hypertension.  Patient did present hypertensive with initial blood pressure at  155/117.  Resumed home losartan and clonidine

## 2023-01-15 NOTE — ED Notes (Signed)
Patient at the desk, advised I had sent a message to [place], DO about him leaving for his appointment and coming back here for his DVT study but she had not responded back yet, patient upset, fussing and cussing that he's not going to die here, because we won't let him leave for his cancer treatment, " this is bullshit, I'm not fucking dying here when I have cancer coursing through my body waiting on the doctor to respond I'm going to [place] my treatment.

## 2023-01-15 NOTE — ED Notes (Signed)
Advised patient he would be leaving AMA, that the provider wanted to do a DVT study here it was scheduled at 1 but may could be sooner, patient states " did you not hear me, I'm not dying here, I need to go, I'm not waiting for some doctor to tell me I can go get my cancer treatment".

## 2023-01-15 NOTE — ED Notes (Signed)
At[place], DO notified patient left AMA and he did not sign any paperwork.

## 2023-01-15 NOTE — ED Triage Notes (Signed)
Pt c/o leg swelling x 4 days. Was here earlier tonight and had to leave to take his grandkids home. R leg swelling with redness noted. Pt arrived on RA 85%; is supposed to be 3-4 L

## 2023-01-15 NOTE — ED Notes (Signed)
Patient upset about his breakfast tray not being here, fussing, complaining, pt is very rude, advised I could call and request his tray.

## 2023-01-15 NOTE — ED Provider Triage Note (Addendum)
Emergency Medicine Provider Triage Evaluation Note  Jatin Naumann , a 52 y.o. male  was evaluated in triage.  Pt complains of left leg pain and swelling and shortness of breath.  He was seen earlier today and advised on admission for pneumonia, but left to get to another appointment. He states he is coming back to complete his treatment..  Review of Systems  Positive: Left leg pain, sob Negative: fever  Physical Exam  BP 132/88 (BP Location: Right Arm)   Pulse 100   Temp 98.1 F (36.7 C) (Oral)   Resp 20   SpO2 98%  Gen:   Awake, no distress   Resp:  Normal effort  MSK:   Moves extremities without difficulty  Other:    Medical Decision Making  Medically screening exam initiated at 5:40 PM.  Appropriate orders placed.  Marquese Marvin was informed that the remainder of the evaluation will be completed by another provider, this initial triage assessment does not replace that evaluation, and the importance of remaining in the ED until their evaluation is complete.     Gwenevere Abbot, PA-C 01/15/23 1741    Gwenevere Abbot, PA-C 01/15/23 1742

## 2023-01-15 NOTE — ED Notes (Signed)
Patient upset about his medication regimen, said he has told us that he doesn't take large pills, states he does not want to take the prednisone because a doctor did not talk to him about it and he did not want to die here because of that medicine.  States he needs to leave because he has an appointment at [facility] at 11 am. Advised patient that I would need to ask the provider about leaving and then coming back.

## 2023-01-16 ENCOUNTER — Observation Stay (HOSPITAL_COMMUNITY): Payer: 59

## 2023-01-16 ENCOUNTER — Encounter (HOSPITAL_COMMUNITY): Payer: Self-pay | Admitting: Student in an Organized Health Care Education/Training Program

## 2023-01-16 ENCOUNTER — Observation Stay (HOSPITAL_BASED_OUTPATIENT_CLINIC_OR_DEPARTMENT_OTHER): Payer: 59

## 2023-01-16 DIAGNOSIS — R29810 Facial weakness: Secondary | ICD-10-CM | POA: Diagnosis not present

## 2023-01-16 DIAGNOSIS — R531 Weakness: Secondary | ICD-10-CM | POA: Diagnosis not present

## 2023-01-16 DIAGNOSIS — M7989 Other specified soft tissue disorders: Secondary | ICD-10-CM

## 2023-01-16 DIAGNOSIS — F17210 Nicotine dependence, cigarettes, uncomplicated: Secondary | ICD-10-CM

## 2023-01-16 LAB — BASIC METABOLIC PANEL
Anion gap: 7 (ref 5–15)
BUN: 12 mg/dL (ref 6–20)
CO2: 24 mmol/L (ref 22–32)
Calcium: 8.2 mg/dL — ABNORMAL LOW (ref 8.9–10.3)
Chloride: 106 mmol/L (ref 98–111)
Creatinine, Ser: 0.85 mg/dL (ref 0.61–1.24)
GFR, Estimated: >60 mL/min (ref >60)
Glucose, Bld: 111 mg/dL — ABNORMAL HIGH (ref 70–99)
Potassium: 4 mmol/L (ref 3.5–5.1)
Sodium: 137 mmol/L (ref 135–145)

## 2023-01-16 LAB — CBC WITH DIFFERENTIAL/PLATELET
Abs Immature Granulocytes: 0.05 10*3/uL (ref 0.00–0.07)
Basophils Absolute: 0 10*3/uL (ref 0.0–0.1)
Basophils Relative: 0 %
Eosinophils Absolute: 0 10*3/uL (ref 0.0–0.5)
Eosinophils Relative: 0 %
HCT: 49.1 % (ref 39.0–52.0)
Hemoglobin: 16.5 g/dL (ref 13.0–17.0)
Immature Granulocytes: 1 %
Lymphocytes Relative: 8 %
Lymphs Abs: 0.6 10*3/uL — ABNORMAL LOW (ref 0.7–4.0)
MCH: 32.3 pg (ref 26.0–34.0)
MCHC: 33.6 g/dL (ref 30.0–36.0)
MCV: 96.1 fL (ref 80.0–100.0)
Monocytes Absolute: 0.6 10*3/uL (ref 0.1–1.0)
Monocytes Relative: 9 %
Neutro Abs: 5.5 10*3/uL (ref 1.7–7.7)
Neutrophils Relative %: 82 %
RBC: 5.11 MIL/uL (ref 4.22–5.81)
RDW: 14.6 % (ref 11.5–15.5)
WBC: 6.8 10*3/uL (ref 4.0–10.5)
nRBC: 0 % (ref 0.0–0.2)

## 2023-01-16 LAB — CBC
HCT: 44.5 % (ref 39.0–52.0)
Hemoglobin: 14.9 g/dL (ref 13.0–17.0)
MCH: 32 pg (ref 26.0–34.0)
MCHC: 33.5 g/dL (ref 30.0–36.0)
MCV: 95.5 fL (ref 80.0–100.0)
Platelets: 259 10*3/uL (ref 150–400)
RBC: 4.66 MIL/uL (ref 4.22–5.81)
RDW: 14.5 % (ref 11.5–15.5)
WBC: 9.1 10*3/uL (ref 4.0–10.5)
nRBC: 0 % (ref 0.0–0.2)

## 2023-01-16 MED ORDER — RIVAROXABAN 10 MG PO TABS
10.0000 mg | ORAL_TABLET | Freq: Every day | ORAL | Status: DC
  Administered 2023-01-16 – 2023-01-17 (×2): 10 mg via ORAL
  Filled 2023-01-16 (×2): qty 1

## 2023-01-16 MED ORDER — IOHEXOL 350 MG/ML SOLN
125.0000 mL | Freq: Once | INTRAVENOUS | Status: AC | PRN
  Administered 2023-01-16: 125 mL via INTRAVENOUS

## 2023-01-16 NOTE — Evaluation (Signed)
Occupational Therapy Evaluation Patient Details Name: Richard [name] MRN: [id number] DOB: 05/17/1971 Today's Date: 01/16/2023   History of Present Illness Pt is a 52 y.o. male who returned 01/15/23 after leaving AMA from being in the ED 2/1-2/2 for L lower extremity swelling in order to attend an outpatient oncology appointment. Work-up pending. PMH: COPD, DVT, HTN, lung cancer stage IIIb, hx of TBI with seizures in Keppra, asthma   Clinical Impression   This 52 yo male admitted with above presents to acute OT with PLOF of being independent with basic ADLs, IADLs, and driving. Currently pt is Mod I with all basic ADLs with mild deficits in LUE strength and coordination with more reports of issue with sensation on the left. He will continue to benefit from acute OT without need for follow up.     Recommendations for follow up therapy are one component of a multi-disciplinary discharge planning process, led by the attending physician.  Recommendations may be updated based on patient status, additional functional criteria and insurance authorization.   Follow Up Recommendations  No OT follow up     Assistance Recommended at Discharge PRN  Patient can return home with the following Assist for transportation;Assistance with cooking/housework    Functional Status Assessment  Patient has had a recent decline in their functional status and demonstrates the ability to make significant improvements in function in a reasonable and predictable amount of time.  Equipment Recommendations  None recommended by OT       Precautions / Restrictions Precautions Precautions: Other (comment) Precaution Comments: watch SpO2 Restrictions Weight Bearing Restrictions: No      Mobility Bed Mobility Overal bed mobility: Independent                  Transfers Overall transfer level: Modified independent Equipment used: None Transfers: Sit to/from Stand             General transfer comment:  increased time      Balance Overall balance assessment: Mild deficits observed, not formally tested                                         ADL either performed or assessed with clinical judgement   ADL Overall ADL's : Modified independent                                       General ADL Comments: increaed time due to swollen LLE     Vision Baseline Vision/History: 1 Wears glasses Ability to See in Adequate Light: 0 Adequate Patient Visual Report: Diplopia Vision Assessment?: Yes Eye Alignment: Within Functional Limits Ocular Range of Motion: Within Functional Limits (initially it was limited on the left, but then upon continuing to test it was not) Alignment/Gaze Preference: Within Defined Limits Tracking/Visual Pursuits: Able to track stimulus in all quads without difficulty (initially he had trouble tracking all the way to the left but with continued testing he did not) Visual Fields: No apparent deficits Additional Comments: Reports intermittent bouts of double vision but none while I was with him            Pertinent Vitals/Pain Pain Assessment Pain Assessment: Faces Faces Pain Scale: Hurts little more Pain Location: L leg Pain Descriptors / Indicators: Discomfort, Grimacing, Tightness Pain Intervention(s): Limited activity within patient's tolerance,  Monitored during session, Repositioned     Hand Dominance Right   Extremity/Trunk Assessment Upper Extremity Assessment Upper Extremity Assessment: LUE deficits/detail LUE Deficits / Details: compared to RUE there is a mild decrease in strength and coordination; but more so in sensation LUE Sensation: decreased light touch LUE Coordination: decreased fine motor      Communication Communication Communication:  (no issues with finding words during OT session)   Cognition Arousal/Alertness: Awake/alert Behavior During Therapy: WFL for tasks assessed/performed Overall Cognitive  Status: Within Functional Limits for tasks assessed                                 General Comments: Educated pt on use of O2 when on RA it drops below 88%--he doesn't seem to understand v. not concerned with the ramifications of chronic low O2 in the long run and is only interested in using O2 when he feels like his body tells him he needs to. Pt dropped as low as 82% on RA (because he stated he does not need it--depsite O2 reading at rest in room on 4 liters being 90%) with ambulation up and down the hall and in room ).When told he needs O2 on if sats below 88% on RA his response was "yes, I have heard that"                Home Living Family/patient expects to be discharged to:: Private residence Living Arrangements: Spouse/significant other Available Help at Discharge: Family;Available PRN/intermittently (wife is housekeeper here at [facility]) Type of Home: House Home Access: Stairs to enter Technical brewer of Steps: 2 Entrance Stairs-Rails: None Home Layout: One level     Bathroom Shower/Tub: Teacher, early years/pre: Standard     Home Equipment: Cane - single point ([place] stolen in last day)   Additional Comments: Pt reports he wears only O2 when he feels like his body is telling him that he needs it, then he wears 3-4 liters depending on symptoms      Prior Functioning/Environment Prior Level of Function : Independent/Modified Independent;Driving             Mobility Comments: Uses SPC (but lost this admission) ADLs Comments: No longer working        OT Problem List: Decreased coordination;Decreased strength;Impaired UE functional use      OT Treatment/Interventions: Self-care/ADL training;Therapeutic exercise;Therapeutic activities;Patient/family education    OT Goals(Current goals can be found in the care plan section) Acute Rehab OT Goals Patient Stated Goal: to figure out what is wrong with my leg and left side OT Goal Formulation:  With patient Time For Goal Achievement: 01/30/23 Potential to Achieve Goals: Good  OT Frequency: Min 2X/week       AM-PAC OT "6 Clicks" Daily Activity     Outcome Measure Help from another person eating meals?: None Help from another person taking care of personal grooming?: None Help from another person toileting, which includes using toliet, bedpan, or urinal?: None Help from another person bathing (including washing, rinsing, drying)?: None Help from another person to put on and taking off regular upper body clothing?: None Help from another person to put on and taking off regular lower body clothing?: None 6 Click Score: 24   End of Session Nurse Communication:  (RN saw him walking in hallway with me)  Activity Tolerance: Patient tolerated treatment well (despite his drop in O2 to 82% on RA) Patient  left:  (in wheelchair getting ready to go down for MRI)  OT Visit Diagnosis: Muscle weakness (generalized) (M62.81)                Time: 0757-3225 OT Time Calculation (min): 30 min Charges:  OT General Charges $OT Visit: 1 Visit OT Evaluation $OT Eval Moderate Complexity: 1 Mod OT Treatments $Self Care/Home Management : 8-22 mins [place] Office [phone]    Almon Register 01/16/2023, 11:02 AM

## 2023-01-16 NOTE — Progress Notes (Addendum)
HD#1 SUBJECTIVE:  Patient Summary: Richard [name] is a 52 y.o. with a pertinent PMH of COPD, previous DVT, hypertension, and stage IIIb lung cancer, who presented with leg swelling and admitted for DVT rule out and workup of left lower extremity edema.   Overnight Events: No acute events overnight  Interim History: The patient returned to the hospital to complete his workup after his oncology appt. He states that his left leg is still painful and swollen, but unchanged compared to yesterday. Of note, I re-evaluated the patient an hour later and he also noted that his left arm seems to be weaker than the right, and that has been ongoing for the past couple of days.   OBJECTIVE:  Vital Signs: Vitals:   01/16/23 0015 01/16/23 0109 01/16/23 0427 01/16/23 0454  BP: (!) 120/91 111/88 (!) 137/106   Pulse:  89 87   Resp:  20 20   Temp:  97.6 F (36.4 C) 98 F (36.7 C)   TempSrc:  Oral Oral   SpO2:  96% 94% 94%  Weight:  91 kg    Height:  5\' 9"  (1.753 m)     Supplemental O2: Nasal Cannula SpO2: 94 % O2 Flow Rate (L/min): 4 L/min  Filed Weights   01/15/23 2338 01/16/23 0109  Weight: 94.3 kg 91 kg     Intake/Output Summary (Last 24 hours) at 01/16/2023 2703 Last data filed at 01/15/2023 2029 Gross per 24 hour  Intake 100 ml  Output --  Net 100 ml   Net IO Since Admission: 100 mL [01/16/23 0620]  Physical Exam: General: No acute distress. CV: RRR. No murmurs, rubs, or gallops.  Pulmonary: Lungs CTAB. Normal effort on 3 L  Extremities: 2+ pitting edema in left lower extremity, up to the knee with some erythema and wamrth. Normal bulk and tone of right lower extremity.  Skin: Warm and dry.  Neuro: A&Ox3. EOMI. Sensation intact in V1, V2, V3 distribution on R, but altered on left (numb). Facial droop on left upon patient smiling. 5/5 strength RUE, 3-4/5 strength LUE. 5/5 strength RLE, 3-4/5 strength LLE. Normal finger to nose testing bilaterally.  Psych: Normal mood and  affect     ASSESSMENT/PLAN:  Assessment: Principal Problem:   Left leg swelling   Plan: Left lower extremity swelling History of DVT Patient returned to hospital to complete workup of his left lower extremity swelling. With his history of DVT, recent hospitalization, and active lung cancer diagnosis, he is at risk for DVT. No signs of skin or soft tissue infeciton in leg, and patient is afebrile without leukocytosis.  - U/S left lower extremity to rule out DVT - Consider abdominal and pelvic imaging of iliac veins if U/S negative (metastatic disease could be causing more proximal obstruction) - Xarelto 10 mg daily for DVT prophylaxis, will increase to therapeutic dose if DVT present  Left sided weakness Left facial droop Patient noted to PT this morning that his friends have noticed his whole left side is weaker than the right. This has been ongoing the last couple of days, since his left leg started to become more swollen. I re-evaluated the patient after hearing these concerns and with the acute neurologic findings detailed in physical exam as above, I am concerned he may have had an acute/subacute infarction. - STAT MRI to rule out infarction; no need to obtain CT head as symptoms have been ongoing for couple of days - PT/OT as able   COPD Recently hospitalized and treated for  COPD exacerbation secondary to RSV. Patient is satting well on home 3 L [place], no concerns for acute exacerbation at this time. - Continue Breo Ellipta and Incruse Ellipta  HFpEF Patient is euvolemic on exam and kidney function is within normal limits. Consider SGLT2i.   Seizures - Keppra 500 mg bid (home dose)  Hypertension Diastolic BP high while here - in the 90-100s. On losartan and clonidine at home.  - Losartan 100 mg daily - Clonidine 0.3 mg bid  Best Practice: Diet: Regular diet IVF: Fluids: none VTE: enoxaparin (LOVENOX) injection 40 mg Start: 01/16/23 0800 Code: Full AB: none Therapy  Recs: Pending DISPO: Anticipated discharge to Home pending  workup completion .  Signature: Buddy Duty, D.O.  Internal Medicine Resident, PGY-2 [facility]  Pager: [phone] 6:20 AM, 01/16/2023   Please contact the on call pager after 5 pm and on weekends at [phone].

## 2023-01-16 NOTE — Evaluation (Signed)
Physical Therapy Evaluation Patient Details Name: Richard [name] MRN: [id number] DOB: [birth year] Today's Date: 01/16/2023  History of Present Illness  Pt is a 52 y.o. male who returned 01/15/23 after leaving AMA from being in the ED 2/1-2/2 for L lower extremity swelling in order to attend an outpatient oncology appointment. Work-up pending. PMH: COPD, DVT, HTN, lung cancer stage IIIb, hx of TBI with seizures in Keppra, asthma   Clinical Impression  Pt presents with condition above and deficits mentioned below, see PT Problem List. PTA, he was mod I using a SPC for mobility, living with his wife in a 1-level house with 1 STE. Pt reports he began to have difficulty articulating words and "shaking" in his L UE in the last 3-4 days along with a recent increase in blurred vision and some double vision (worse symptoms noted in L eye per pt). He says his friends have been telling him that it "seems like your left side does not want to work right". He does demonstrate some incoordination in his upper extremities, more so in the L. He also displayed weakness in his muscles inferior to the knee on the L, but this could be related to the grade 4 pitting edema noted in that lower leg. Notified RN and MD of these concerns. Despite these symptoms, pt was able to ambulate with only intermittent 1 UE support at a supervision level without LOB. He does not appear to be functioning far from his baseline. His SpO2 would decrease to as low as 76% on RA when ambulating with pt seemingly unaware of this safety concern as well, thus educated pt on importance of using his supplemental O2 when his sats decrease below 88%. Will continue to follow acutely to further address his deficits and provide pt education, but do not anticipate follow-up PT needs at d/c.     Recommendations for follow up therapy are one component of a multi-disciplinary discharge planning process, led by the attending physician.  Recommendations may be updated  based on patient status, additional functional criteria and insurance authorization.  Follow Up Recommendations No PT follow up      Assistance Recommended at Discharge PRN  Patient can return home with the following  Assistance with cooking/housework    Equipment Recommendations Kasandra Knudsen (his was stolen)  Recommendations for Other Services       Functional Status Assessment Patient has had a recent decline in their functional status and demonstrates the ability to make significant improvements in function in a reasonable and predictable amount of time.     Precautions / Restrictions Precautions Precautions: Other (comment) Precaution Comments: watch SpO2 Restrictions Weight Bearing Restrictions: No      Mobility  Bed Mobility Overal bed mobility: Independent             General bed mobility comments: Pt able to perform bed mobility without assistance, bed flat and no use of rails.    Transfers Overall transfer level: Needs assistance Equipment used: None Transfers: Sit to/from Stand Sit to Stand: Supervision           General transfer comment: Supervision for safety, but no overt LOB    Ambulation/Gait Ambulation/Gait assistance: Supervision Gait Distance (Feet): 240 Feet Assistive device: None, 1 person hand held assist Gait Pattern/deviations: Step-through pattern, Decreased stride length, Decreased dorsiflexion - left, Decreased step length - left Gait velocity: reduced Gait velocity interpretation: 1.31 - 2.62 ft/sec, indicative of limited community ambulator   General Gait Details: Pt with slightly slowed, but fairly  steady gait with no overt LOB. Pt with noted L foot drag and pt slows gait as DOE increases, x2 standing rest breaks. SpO2 as low as 76% on RA. Encouraged pt to use supplemental O2 via [place] but pt stating "no I am fine. I will let you know if I am not". Pt initially ambulating without UE support, but as sats decreased and pt fatigues he did grab  the wall rail intermittently for support with 1 UE. Supervision for safety.  Stairs            Wheelchair Mobility    Modified Rankin (Stroke Patients Only)       Balance Overall balance assessment: Mild deficits observed, not formally tested                                           Pertinent Vitals/Pain Pain Assessment Pain Assessment: Faces Faces Pain Scale: Hurts little more Pain Location: L leg Pain Descriptors / Indicators: Discomfort, Grimacing Pain Intervention(s): Limited activity within patient's tolerance, Monitored during session, Repositioned    Home Living Family/patient expects to be discharged to:: Private residence Living Arrangements: Spouse/significant other Available Help at Discharge: Family;Available PRN/intermittently (wife works as Secretary/administrator at [employer]) Type of Home: House Home Access: Stairs to enter Entrance Stairs-Rails: None Technical brewer of Steps: 2   Home Layout: One level Home Equipment: Radio producer - single point ([place] stolen in last day) Additional Comments: Pt reports moments he does not use supplemental O2, but sometimes he uses up to 3-4 L O2 depending on symptoms    Prior Function Prior Level of Function : Independent/Modified Independent;Driving             Mobility Comments: Uses SPC ADLs Comments: No longer working     Hand Dominance   Dominant Hand: Right    Extremity/Trunk Assessment   Upper Extremity Assessment Upper Extremity Assessment: Defer to OT evaluation (noted incoordination, L>R)    Lower Extremity Assessment Lower Extremity Assessment: LLE deficits/detail (grade 2 pitting edema in R leg, otherwise MMT scores 4+ to 5 and sensation intact in R leg) LLE Deficits / Details: grade 4 pitting edema and erythema noted below the knee; minimal sensation, primarily only to deep pressure inferior to the knee, intact superior to the knee; MMT scores of 1 to 2- in muscles inferior to the knee, 4+  hip flexion LLE Sensation: decreased light touch LLE Coordination: decreased gross motor    Cervical / Trunk Assessment Cervical / Trunk Assessment: Normal  Communication   Communication: Other (comment) (reports recent issues articulating words, which was noted intermittently during session)  Cognition Arousal/Alertness: Awake/alert Behavior During Therapy: WFL for tasks assessed/performed Overall Cognitive Status: Within Functional Limits for tasks assessed                                          General Comments General comments (skin integrity, edema, etc.): SpO2 >/= 89% on RA at rest, dropped to as low as 76% on RA with gait, encouraged pt to use supplemental O2 via [place] but pt stating "no I am fine. I will let you know if I am not" while ambulating; educated pt on his need to use supplemental O2 when his sats decline < 88% and the importance of getting adequate O2 perfusion to  his organs/body. He verbalized understanding and that he would begin to watch this and adhere to this more carefully. He reports he has a pulse ox at home; Educated pt to elevate L leg and perform heel slides and ankle pumps; Pt reports he began to have difficulty articulating words and "shaking" in his L UE in the last 3-4 days. He says his friends have been telling him that it "seems like your left side does not want to work right". Also, he reports a recent increase in blurred vision and some double vision, which he reports was worse in his L eye when cued to close each eye. - notified RN and MD    Exercises     Assessment/Plan    PT Assessment Patient needs continued PT services  PT Problem List Decreased activity tolerance;Decreased balance;Decreased coordination;Decreased mobility;Impaired sensation       PT Treatment Interventions DME instruction;Gait training;Stair training;Therapeutic activities;Functional mobility training;Therapeutic exercise;Balance training;Neuromuscular  re-education;Patient/family education    PT Goals (Current goals can be found in the Care Plan section)  Acute Rehab PT Goals Patient Stated Goal: to figure out why he is having these symptoms PT Goal Formulation: With patient Time For Goal Achievement: 01/30/23 Potential to Achieve Goals: Good    Frequency Min 2X/week     Co-evaluation               AM-PAC PT "6 Clicks" Mobility  Outcome Measure Help needed turning from your back to your side while in a flat bed without using bedrails?: None Help needed moving from lying on your back to sitting on the side of a flat bed without using bedrails?: None Help needed moving to and from a bed to a chair (including a wheelchair)?: A Little Help needed standing up from a chair using your arms (e.g., wheelchair or bedside chair)?: A Little Help needed to walk in hospital room?: A Little Help needed climbing 3-5 steps with a railing? : A Little 6 Click Score: 20    End of Session Equipment Utilized During Treatment:  (pt declined gait belt) Activity Tolerance: Patient tolerated treatment well Patient left: in bed;with call bell/phone within reach Nurse Communication: Mobility status;Other (comment) (pt reporting increased L incoordination and L visual issues along with speech issues recently-notified RN and MD) PT Visit Diagnosis: Unsteadiness on feet (R26.81);Other abnormalities of gait and mobility (R26.89)    Time: 1610-9604 PT Time Calculation (min) (ACUTE ONLY): 31 min   Charges:   PT Evaluation $PT Eval Low Complexity: 1 Low PT Treatments $Therapeutic Activity: 8-22 mins        Moishe Spice, PT, DPT Acute Rehabilitation Services  Office: [phone]   Orvan Falconer 01/16/2023, 9:01 AM

## 2023-01-16 NOTE — Progress Notes (Signed)
Mobility Specialist Progress Note:   01/16/23 1534  Mobility  Activity Ambulated independently in hallway  Level of Assistance Independent  Assistive Device None  Distance Ambulated (ft) 550 ft  Activity Response Tolerated well  Mobility Referral Yes  $Mobility charge 1 Mobility   Pt received in chair and eager. No complaints. Pt returned to chair with all needs met and call bell in reach.   Andrey Campanile Mobility Specialist Please contact via SecureChat or  Rehab office at [phone]

## 2023-01-16 NOTE — ED Notes (Signed)
ED TO INPATIENT HANDOFF REPORT  ED Nurse Name and Phone #: Mosie Lukes RN [phone]   S Name/Age/Gender Richard [name] 52 y.o. male Room/Bed: 005C/005C  Code Status   Code Status: Full Code  Home/SNF/Other Home Patient oriented to: self, place, time, and situation Is this baseline? Yes   Triage Complete: Triage complete  Chief Complaint Left leg swelling [M79.89]  Triage Note Patient returns after leaving against medical advice earlier today and states he would like to complete his treatment plan from before leaving. Patient is alert, oriented, and in no apparent distress at this time.   Allergies Allergies  Allergen Reactions   Dilaudid [Hydromorphone Hcl] Nausea Only and Other (See Comments)    Bradycardia and hyperthermia, too   Tape Itching and Dermatitis    Level of Care/Admitting Diagnosis ED Disposition     ED Disposition  Admit   Condition  --   Comment  Hospital Area: [place] [[id number]]  Level of Care: Telemetry Medical [104]  May place patient in observation at [facility] or [place] if equivalent level of care is available:: No  Covid Evaluation: Asymptomatic - no recent exposure (last 10 days) testing not required  Diagnosis: Left leg swelling [6387564]  Admitting Physician: Axel Filler [[id number]]  Attending Physician: Axel Filler [[id number]]          B Medical/Surgery History Past Medical History:  Diagnosis Date   Asthma    Brain bleed ([place])    DVT (deep venous thrombosis) ([place]) 02/19/2015   RLE   GERD (gastroesophageal reflux disease)    History of home oxygen therapy    Hypertension    Seizures ([place])    last episode 03/2013   Stroke [facility])    Past Surgical History:  Procedure Laterality Date   BRONCHIAL BIOPSY  02/10/2022   Procedure: BRONCHIAL BIOPSIES;  Surgeon: Rigoberto Noel, MD;  Location: [facility] ENDOSCOPY;  Service: Cardiopulmonary;;   BRONCHIAL BRUSHINGS  02/10/2022   Procedure:  BRONCHIAL BRUSHINGS;  Surgeon: Rigoberto Noel, MD;  Location: [facility] ENDOSCOPY;  Service: Cardiopulmonary;;   BRONCHIAL NEEDLE ASPIRATION BIOPSY  02/10/2022   Procedure: BRONCHIAL NEEDLE ASPIRATION BIOPSIES;  Surgeon: Rigoberto Noel, MD;  Location: [facility];  Service: Cardiopulmonary;;   BRONCHIAL WASHINGS  02/10/2022   Procedure: BRONCHIAL WASHINGS;  Surgeon: Rigoberto Noel, MD;  Location: [facility];  Service: Cardiopulmonary;;   ENDOBRONCHIAL ULTRASOUND Bilateral 02/10/2022   Procedure: ENDOBRONCHIAL ULTRASOUND;  Surgeon: Rigoberto Noel, MD;  Location: [facility]OS[facility];  Service: Cardiopulmonary;  Laterality: Bilateral;   LEG SURGERY     VIDEO BRONCHOSCOPY  02/10/2022   Procedure: VIDEO BRONCHOSCOPY WITHOUT FLUORO;  Surgeon: Rigoberto Noel, MD;  Location: [facility];  Service: Cardiopulmonary;;     A IV Location/Drains/Wounds [name]/Drains/Airways Status     Active Line/Drains/Airways     Name Placement date Placement time Site Days   Peripheral IV 01/15/23 20 G Left Antecubital 01/15/23  1939  Antecubital  1   Wound / Incision (Open or Dehisced) 12/28/22 Puncture Back Right lung biopsy 12/28/22  1406  Back  19            Intake/Output Last 24 hours  Intake/Output Summary (Last 24 hours) at 01/16/2023 0040 Last data filed at 01/15/2023 2029 Gross per 24 hour  Intake 100 ml  Output --  Net 100 ml    Labs/Imaging Results for orders placed or performed during the hospital encounter of 01/15/23 (from the past 48 hour(s))  CBC  with Differential     Status: Abnormal   Collection Time: 01/15/23  5:55 PM  Result Value Ref Range   WBC 10.3 4.0 - 10.5 K/uL   RBC 5.08 4.22 - 5.81 MIL/uL   Hemoglobin 16.6 13.0 - 17.0 g/dL   HCT 49.7 39.0 - 52.0 %   MCV 97.8 80.0 - 100.0 fL   MCH 32.7 26.0 - 34.0 pg   MCHC 33.4 30.0 - 36.0 g/dL   RDW 14.6 11.5 - 15.5 %   Platelets 274 150 - 400 K/uL   nRBC 0.0 0.0 - 0.2 %   Neutrophils Relative % 79 %   Neutro Abs 8.1 (H) 1.7 - 7.7 K/uL    Lymphocytes Relative 8 %   Lymphs Abs 0.8 0.7 - 4.0 K/uL   Monocytes Relative 12 %   Monocytes Absolute 1.2 (H) 0.1 - 1.0 K/uL   Eosinophils Relative 0 %   Eosinophils Absolute 0.0 0.0 - 0.5 K/uL   Basophils Relative 0 %   Basophils Absolute 0.0 0.0 - 0.1 K/uL   Immature Granulocytes 1 %   Abs Immature Granulocytes 0.08 (H) 0.00 - 0.07 K/uL    Comment: Performed at [facility], [address] [place], [place] [address]  Basic metabolic panel     Status: Abnormal   Collection Time: 01/15/23  5:55 PM  Result Value Ref Range   Sodium 134 (L) 135 - 145 mmol/L   Potassium 3.7 3.5 - 5.1 mmol/L   Chloride 103 98 - 111 mmol/L   CO2 19 (L) 22 - 32 mmol/L   Glucose, Bld 126 (H) 70 - 99 mg/dL    Comment: Glucose reference range applies only to samples taken after fasting for at least 8 hours.   BUN 10 6 - 20 mg/dL   Creatinine, Ser 0.84 0.61 - 1.24 mg/dL   Calcium 8.5 (L) 8.9 - 10.3 mg/dL   GFR, Estimated >60 >60 mL/min    Comment: (NOTE) Calculated using the CKD-EPI Creatinine Equation (2021)    Anion gap 12 5 - 15    Comment: Performed at [facility] [address] [place], [place] [address]   DG Chest 1 View  Result Date: 01/15/2023 CLINICAL DATA:  Short of breath, history of non-small cell lung cancer EXAM: CHEST  1 VIEW COMPARISON:  01/14/2023, 01/15/2023 FINDINGS: Single frontal view of the chest demonstrates a stable cardiac silhouette. Stable right hilar mass. Stable bilateral areas of scarring without airspace disease, effusion, or pneumothorax. No acute bony abnormality. IMPRESSION: 1. Stable right hilar mass. 2. Stable bilateral scarring, no acute airspace disease. Electronically Signed   By: Randa Ngo M.D.   On: 01/15/2023 21:44   CT Angio Chest Pulmonary Embolism (PE) W or WO Contrast  Result Date: 01/15/2023 CLINICAL DATA:  Right leg swelling and redness. Increasing cough and shortness of breath. Non-small-cell lung cancer. * Tracking Code: BO * EXAM: CT  ANGIOGRAPHY CHEST WITH CONTRAST TECHNIQUE: Multidetector CT imaging of the chest was performed using the standard protocol during bolus administration of intravenous contrast. Multiplanar CT image reconstructions and MIPs were obtained to evaluate the vascular anatomy. RADIATION DOSE REDUCTION: This exam was performed according to the departmental dose-optimization program which includes automated exposure control, adjustment of the mA and/or kV according to patient size and/or use of iterative reconstruction technique. CONTRAST:  61mL OMNIPAQUE IOHEXOL 350 MG/ML SOLN COMPARISON:  12/25/2022 FINDINGS: Cardiovascular: Heart size upper normal to mildly increased. No pericardial effusion. Ascending thoracic aorta measures 4.0  cm diameter. Enlargement of the pulmonary outflow tract/main pulmonary arteries suggests pulmonary arterial hypertension. There is no filling defect within the opacified pulmonary arteries to suggest the presence of an acute pulmonary embolus. Mediastinum/Nodes: Similar mediastinal lymphadenopathy including 12 mm short axis AP window lymph node and 9 mm short axis low right paratracheal node. Similar appearance of the retro hilar right lung mass measuring 7.7 x 4.2 cm today compared to 7.9 x 4.1 cm previously. This lesion generates mass-effect on the right mainstem bronchus and bronchus intermedius. Lungs/Pleura: Centrilobular and paraseptal emphysema evident. Retro hilar posterior right lung mass, as above. There is bronchial wall thickening and the lower lungs bilaterally with areas of peripheral small airway impaction in the right lower lobe. No suspicious pulmonary nodule or mass in the left lung. No pleural effusion. Upper Abdomen: Stable left adrenal nodule previously characterized as benign adenoma. No followup imaging is recommended. Musculoskeletal: No worrisome lytic or sclerotic osseous abnormality. Review of the MIP images confirms the above findings. IMPRESSION: 1. No CT evidence for  acute pulmonary embolus. 2. Enlargement of the pulmonary outflow tract/main pulmonary arteries suggests pulmonary arterial hypertension. 3. Ascending thoracic aorta measuring up to 4.0 cm diameter on today's study. Recommend annual imaging followup by CTA or MRA. This recommendation follows 2010 ACCF/AHA/AATS/ACR/ASA/SCA/SCAI/SIR/STS/SVM Guidelines for the Diagnosis and Management of Patients with Thoracic Aortic Disease. Circulation. 2010; 121: Z610-R604. Aortic aneurysm NOS (ICD10-I71.9) 4. Similar appearance of the known retro hilar lung mass involving the fissure. 5. Similar mediastinal lymphadenopathy. 6.  Emphysema (ICD10-J43.9). Electronically Signed   By: Misty Stanley M.D.   On: 01/15/2023 05:16   DG Chest Portable 1 View  Result Date: 01/14/2023 CLINICAL DATA:  Cough EXAM: PORTABLE CHEST 1 VIEW COMPARISON:  Chest x-ray 01/09/2023.  CT of the chest 12/25/2022 FINDINGS: Right suprahilar opacity is not well seen secondary to rotation, but is grossly unchanged corresponding to patient's mass on prior CT. There is some new minimal patchy opacities in the left lung base. The heart is mildly enlarged, unchanged. No pleural effusion or pneumothorax. No acute fractures. IMPRESSION: New minimal patchy opacities in the left lung base, which could represent atelectasis or early pneumonia. Otherwise stable exam. Electronically Signed   By: Ronney Asters M.D.   On: 01/14/2023 20:53    Pending Labs Unresulted Labs (From admission, onward)     Start     Ordered   01/22/23 0500  Creatinine, serum  (enoxaparin (LOVENOX)    CrCl >/= 30 ml/min)  Weekly,   R     Comments: while on enoxaparin therapy    01/15/23 2208   01/16/23 5409  Basic metabolic panel  Tomorrow morning,   R        01/15/23 2208   01/16/23 0500  CBC  Tomorrow morning,   R        01/15/23 2208            Vitals/Pain Today's Vitals   01/15/23 2315 01/15/23 2330 01/15/23 2338 01/16/23 0015  BP: (!) 115/90 (!) 118/91 (!) 118/91 (!)  120/91  Pulse: 87  87   Resp:   16   Temp:   98 F (36.7 C)   TempSrc:   Oral   SpO2: 92%  92%   Weight:   94.3 kg   Height:   5\' 9"  (1.753 m)   PainSc:   0-No pain     Isolation Precautions No active isolations  Medications Medications  enoxaparin (LOVENOX) injection 40 mg (has no administration in  time range)  fluticasone furoate-vilanterol (BREO ELLIPTA) 200-25 MCG/ACT 1 puff (has no administration in time range)  cloNIDine (CATAPRES) tablet 0.3 mg (0.3 mg Oral Given 01/15/23 2235)  losartan (COZAAR) tablet 100 mg (has no administration in time range)  umeclidinium bromide (INCRUSE ELLIPTA) 62.5 MCG/ACT 1 puff (has no administration in time range)  ipratropium-albuterol (DUONEB) 0.5-2.5 (3) MG/3ML nebulizer solution 3 mL (3 mLs Nebulization Given 01/15/23 1944)  cefTRIAXone (ROCEPHIN) 1 g in sodium chloride 0.9 % 100 mL IVPB (0 g Intravenous Stopped 01/15/23 2029)    Mobility walks     Focused Assessments Cardiac Assessment Handoff:    Lab Results  Component Value Date   CKTOTAL 134 05/09/2020   CKMB 2.0 05/10/2011   TROPONINI <0.03 01/02/2017   Lab Results  Component Value Date   DDIMER 1.58 (H) 01/14/2023   Does the Patient currently have chest pain? No    R Recommendations: See Admitting Provider Note  Report given to:   Additional Notes:

## 2023-01-16 NOTE — Progress Notes (Signed)
VASCULAR LAB    Left lower extremity venous duplex has been performed.  See CV proc for preliminary results.   Randen Kauth, RVT 01/16/2023, 9:43 AM

## 2023-01-17 DIAGNOSIS — I712 Thoracic aortic aneurysm, without rupture, unspecified: Secondary | ICD-10-CM | POA: Diagnosis not present

## 2023-01-17 DIAGNOSIS — R29810 Facial weakness: Secondary | ICD-10-CM | POA: Diagnosis not present

## 2023-01-17 DIAGNOSIS — Z8673 Personal history of transient ischemic attack (TIA), and cerebral infarction without residual deficits: Secondary | ICD-10-CM | POA: Insufficient documentation

## 2023-01-17 DIAGNOSIS — C3431 Malignant neoplasm of lower lobe, right bronchus or lung: Secondary | ICD-10-CM | POA: Diagnosis present

## 2023-01-17 DIAGNOSIS — Z5111 Encounter for antineoplastic chemotherapy: Secondary | ICD-10-CM | POA: Insufficient documentation

## 2023-01-17 DIAGNOSIS — Z510 Encounter for antineoplastic radiation therapy: Secondary | ICD-10-CM | POA: Diagnosis not present

## 2023-01-17 DIAGNOSIS — Z86718 Personal history of other venous thrombosis and embolism: Secondary | ICD-10-CM | POA: Diagnosis not present

## 2023-01-17 DIAGNOSIS — F17200 Nicotine dependence, unspecified, uncomplicated: Secondary | ICD-10-CM | POA: Diagnosis not present

## 2023-01-17 DIAGNOSIS — R531 Weakness: Secondary | ICD-10-CM | POA: Diagnosis not present

## 2023-01-17 DIAGNOSIS — M7989 Other specified soft tissue disorders: Secondary | ICD-10-CM | POA: Diagnosis not present

## 2023-01-17 MED ORDER — LEVETIRACETAM 100 MG/ML PO SOLN
500.0000 mg | Freq: Two times a day (BID) | ORAL | Status: DC
  Administered 2023-01-17: 500 mg via ORAL
  Filled 2023-01-17 (×2): qty 5

## 2023-01-17 MED ORDER — LEVETIRACETAM 500 MG PO TABS: 500.0000 mg | ORAL_TABLET | Freq: Two times a day (BID) | ORAL | Status: DC

## 2023-01-17 MED ORDER — LEVETIRACETAM 500 MG PO TABS
500.0000 mg | ORAL_TABLET | Freq: Two times a day (BID) | ORAL | Status: DC
  Filled 2023-01-17: qty 1

## 2023-01-17 MED ORDER — GUAIFENESIN-DM 100-10 MG/5ML PO SYRP
5.0000 mL | ORAL_SOLUTION | ORAL | Status: DC | PRN
  Administered 2023-01-17: 5 mL via ORAL
  Filled 2023-01-17: qty 5

## 2023-01-17 NOTE — Discharge Summary (Signed)
Name: Richard [name] MRN: [id number] DOB: 1971-12-05 52 y.o. PCP: Medicine, [facility]  Date of Admission: 01/15/2023  5:01 PM Date of Discharge: 01/17/2023 1:43 PM Attending Physician: Axel Filler, *  Discharge Diagnosis: Principal Problem:   Left leg swelling Active Problems:   Chronic respiratory failure with hypoxia [facility])   Discharge Medications: Allergies as of 01/17/2023       Reactions   Dilaudid [hydromorphone Hcl] Nausea Only, Other (See Comments)   Bradycardia and hyperthermia, too   Tape Itching, Dermatitis        Medication List     STOP taking these medications    GOODY HEADACHE PO   prochlorperazine 10 MG tablet Commonly known as: COMPAZINE       TAKE these medications    albuterol 108 (90 Base) MCG/ACT inhaler Commonly known as: VENTOLIN HFA Inhale 1-2 puffs into the lungs every 6 (six) hours as needed for wheezing or shortness of breath.   [name] 160-4.5 MCG/ACT inhaler Generic drug: budesonide-formoterol Inhale 2 puffs into the lungs daily.   cloNIDine 0.3 MG tablet Commonly known as: CATAPRES Take 1 tablet (0.3 mg total) by mouth 2 (two) times daily.   diclofenac Sodium 1 % Gel Commonly known as: VOLTAREN Apply 4 g topically 4 (four) times daily.   levETIRAcetam 500 MG tablet Commonly known as: KEPPRA Take 1 tablet (500 mg total) by mouth 2 (two) times daily.   losartan 100 MG tablet Commonly known as: COZAAR Take 100 mg by mouth daily.   OXYGEN Inhale 2 L/min into the lungs daily as needed (for shortness of breath).        Follow-up Appointments:  Follow-up Information     Medicine, [facility]. Call.   Specialty: Family Medicine Why: Call for an appointment as soon as possible after leaving the hospital. Contact information: [address] [place] [address] [phone]                 Disposition and follow-up: Mr. Terreon Ekholm is a 52 y.o. year old with history of  DVT and active lung cancer who was admitted for left lower extremity pain and swelling.  Left lower extremity swelling DVT ruled out with doppler and CT venogram didn't show evidence of clot or May-Thurner syndrome. Did not treat for infection--swelling preceded erythema, patient had no leukocytosis, and the history was not suggestive of cellulitis. - Recommended compression stockings, elevation, and ambulation - Consider trial of furosemide  ASCVD risk Thoracic aortic aneurysm Patient was evaluated while hospitalized for very subtle focal weakness. There were no signs of stroke, but there is evidence of cardiovascular disease on imaging, including aortic atherosclerosis and thoracic aortic dilatation to 4.0 cm. Recommend thorough assessment of cardiovascular risk and follow-up of thoracic aortic findings. - Annual CTA or MRA for thoracic aortic aneurysm surveillance - ASCVD risk factor modification  Hospital Course by problem list:  Left lower extremity swelling Presented with 2 days of progressive swelling and pain of left leg. On admission exam, there was no appreciable erythema. Patient's laboratory workup was unremarkable. A left knee x-ray obtained for knee pain during a recent admission was unremarkable. Formal lower extremity doppler ultrasound showed no evidence of proximal or distal DVT. CT venogram showed no evidence of thrombus or May-Thurner syndrome. Cellulitis was considered unlikely given the absence of other signs of infection.  Differential is broad and includes muscle strain (patient reported onset of swelling after activity with grandchildren), lymphatic obstruction (less likely  as no adenopathy or tumorous/anatomic obstruction on CT), venous insufficiency (less likely given tempo of symptoms). This patient was discharged with recommendations for supportive therapy and instructions to follow up with his primary care physician for continued surveillance.   Focal weakness On  hospital day 1 the patient reported some unilateral weakness of left side including arm and face. Neurologic exam was notable for mild facial asymmetry. An MRI brain was obtained that showed no acute abnormalities. The neurologic findings had resolved by hospital day 2. TIA can't be ruled out. This patient is likely at high risk for ASCVD events given smoking and hypertension. He may benefit from aggressive risk factor modification in outpatient setting.   Discharge Exam: Very frustrated at lack of definitive diagnosis for leg swelling.   Blood pressure (!) 136/91, pulse 72, temperature (!) 97.2 F (36.2 C), temperature source Oral, resp. rate 19, height 5\' 9"  (1.753 m), weight 89.4 kg, SpO2 94 %.  Comfortable appearing Heart rate and rhythm regular Breathing is regular and unlabored Left lower extremity edema to the level of the knee Alert and oriented, equal grip strength, no pronator drift, tongue midline, face symmetric, shoulder shrug with equal strength bilaterally  Pertinent studies and procedures:  Lower extremity venous ultrasound summary:  RIGHT:  - No evidence of common femoral vein obstruction.  Normal common femoral vein phasicity. Low suspicion for more proximal  obstruction    LEFT:  - Findings appear essentially unchanged compared to previous examination.  - There is no evidence of deep vein thrombosis in the lower extremity.    - No cystic structure found in the popliteal fossa.  Normal common femoral vein phasicity. Low suspicion for more proximal  obstruction.   CT venogram abdomen/pelvis/lower extremity IMPRESSION: 1. No evidence for venous thrombosis within the abdomen, pelvis or bilateral lower extremities. 2. No signs of May-Thurner syndrome. 3. Mild asymmetric soft tissue edema involving the mid and distal left thigh extending to below the knee. Mild overlying skin thickening is noted. Correlate for any clinical signs or symptoms of cellulitis. 4. Small  left knee joint effusion. 5. Bilateral hydroceles noted within the scrotum, left greater than right. 6.  Aortic Atherosclerosis (ICD10-I70.0).  CTA chest IMPRESSION: 1. No CT evidence for acute pulmonary embolus. 2. Enlargement of the pulmonary outflow tract/main pulmonary arteries suggests pulmonary arterial hypertension. 3. Ascending thoracic aorta measuring up to 4.0 cm diameter on today's study. Recommend annual imaging followup by CTA or MRA. This recommendation follows 2010 ACCF/AHA/AATS/ACR/ASA/SCA/SCAI/SIR/STS/SVM Guidelines for the Diagnosis and Management of Patients with Thoracic Aortic Disease. Circulation. 2010; 121: V612-A449. Aortic aneurysm NOS (ICD10-I71.9) 4. Similar appearance of the known retro hilar lung mass involving the fissure. 5. Similar mediastinal lymphadenopathy. 6.  Emphysema (ICD10-J43.9).  Discharge Instructions:   Discharge Instructions      Mr. Kinta Welsch  You were evaluated and treated in the hospital for left leg pain and swelling. The results of your workup did not show evidence of infection, blood clots, or compression of your leg's blood vessels. I recommend following up with your primary care provider to continue the evaluation of your swollen leg. In the meantime, try compression stockings to help reduce the swelling and discomfort in the leg.  All the best, [name], MD     [name] MD 01/17/2023, 1:43 PM

## 2023-01-17 NOTE — Hospital Course (Addendum)
Principal Problem:   Left leg swelling Active Problems:   Chronic respiratory failure with hypoxia (HCC)  Resolved Problems:   * No resolved hospital problems. *  Consults:***  Procedures:***  Follow-up items: - Thoracic aortic aneurysm

## 2023-01-17 NOTE — Discharge Instructions (Addendum)
[name]  You were evaluated and treated in the hospital for left leg pain and swelling. The results of your workup did not show evidence of infection, blood clots, or compression of your leg's blood vessels. I recommend following up with your primary care provider to continue the evaluation of your swollen leg. In the meantime, try compression stockings to help reduce the swelling and discomfort in the leg.  All the best, [name], MD

## 2023-01-17 NOTE — Plan of Care (Signed)
  Problem: Nutrition: Goal: Adequate nutrition will be maintained Outcome: Completed/Met   Problem: Elimination: Goal: Will not experience complications related to bowel motility Outcome: Completed/Met Goal: Will not experience complications related to urinary retention Outcome: Completed/Met   Problem: Pain Managment: Goal: General experience of comfort will improve Outcome: Completed/Met   Problem: Skin Integrity: Goal: Risk for impaired skin integrity will decrease Outcome: Completed/Met   

## 2023-01-17 NOTE — Progress Notes (Signed)
                 Interval history No new concerns since yesterday. Leg is still swollen and painful. No continuing issues with focal weakness.  Physical exam Blood pressure 116/83, pulse 81, temperature 97.6 F (36.4 C), temperature source Oral, resp. rate 19, height 5\' 9"  (1.753 m), weight 89.4 kg, SpO2 96 %.  Comfortable appearing Heart rate and rhythm regular Breathing is regular and unlabored Left lower extremity edema to the level of the knee Alert and oriented, equal grip strength, no pronator drift, tongue midline, face symmetric, shoulder shrug with equal strength bilaterally  Assessment and plan Hospital day 0  Richard [name] is a 52 y.o. with history of DVT and active lung cancer diagnosis admitted for left leg swelling and pain, workup for venous thrombosis ongoing.  Principal Problem:   Left leg swelling Active Problems:   Chronic respiratory failure with hypoxia (HCC)  Left lower extremity pain and swelling High risk for DVT given history of same and active cancer diagnosis.  Clinically stable.  Left lower extremity ultrasound was negative for DVT.  CT venogram pending to evaluate for venous compression, especially of the iliac veins.  Once venous thrombosis has been definitively ruled out, the workup for this can continue in the outpatient setting. - Rivaroxaban 10 mg daily (prophylactic dose while hospitalized)  Left-sided weakness Left facial droop No apparent deficits on neurologic exam today.  MRI negative for acute intracranial abnormalities.  Cannot rule out something like transient ischemic attack.  Risk factors for ASCVD include smoking and hypertension.  I estimate his 10-year risk to be high despite good cholesterol control and no diabetes history as of October 2022.  Not on statin currently. - Recommend addressing ASCVD risk with PCP  COPD Continue home inhalers.  History of seizures - Levetiracetam 500 mg twice daily  Hypertension - Clonidine 0.3 mg  p.o. twice daily - Losartan 100 mg daily  Diet: Regular IVF: None VTE: Rivaroxaban Code: Full  Discharge plan: Pending venous thromboembolism rule-out   Richard Gasser MD 01/17/2023, 6:24 AM  Pager: [phone] After 5pm on weekdays and 1pm on weekends: [phone]

## 2023-01-17 NOTE — Plan of Care (Signed)
  Problem: Education: Goal: Knowledge of General Education information will improve Description: Including pain rating scale, medication(s)/side effects and non-pharmacologic comfort measures Outcome: Adequate for Discharge   Problem: Health Behavior/Discharge Planning: Goal: Ability to manage health-related needs will improve Outcome: Adequate for Discharge   Problem: Clinical Measurements: Goal: Ability to maintain clinical measurements within normal limits will improve Outcome: Adequate for Discharge Goal: Will remain free from infection Outcome: Adequate for Discharge Goal: Diagnostic test results will improve Outcome: Adequate for Discharge Goal: Respiratory complications will improve Outcome: Adequate for Discharge Goal: Cardiovascular complication will be avoided Outcome: Adequate for Discharge   Problem: Activity: Goal: Risk for activity intolerance will decrease Outcome: Adequate for Discharge   Problem: Coping: Goal: Level of anxiety will decrease Outcome: Adequate for Discharge   Problem: Safety: Goal: Ability to remain free from injury will improve Outcome: Adequate for Discharge

## 2023-01-17 NOTE — Progress Notes (Signed)
Mobility Specialist Progress Note:   01/17/23 0955  Mobility  Activity Ambulated independently in hallway  Level of Assistance Independent  Assistive Device None  Distance Ambulated (ft) 550 ft  Activity Response Tolerated well  Mobility Referral Yes  $Mobility charge 1 Mobility   Pt received sitting EOB and agreeable. No complaints of SOB or pain. Pt returned ambulating in room with all needs met and call bell in reach.   Andrey Campanile Mobility Specialist Please contact via SecureChat or  Rehab office at [phone]

## 2023-01-17 NOTE — Progress Notes (Signed)
MD, u may know or not but the pt was bite by a spider before admission, may have contributed to his swelling in his foot, Thanks, Arvella Nigh RN.

## 2023-01-18 MED FILL — Dexamethasone Sodium Phosphate Inj 100 MG/10ML: INTRAMUSCULAR | Qty: 1 | Status: AC

## 2023-01-19 ENCOUNTER — Other Ambulatory Visit: Payer: Self-pay

## 2023-01-19 ENCOUNTER — Inpatient Hospital Stay: Payer: 59

## 2023-01-19 ENCOUNTER — Ambulatory Visit
Admission: RE | Admit: 2023-01-19 | Discharge: 2023-01-19 | Disposition: A | Discharge status: Home / Self Care | Payer: 59 | Source: Ambulatory Visit | Attending: Radiation Oncology | Admitting: Radiation Oncology

## 2023-01-19 ENCOUNTER — Telehealth: Payer: Self-pay | Admitting: *Deleted

## 2023-01-19 ENCOUNTER — Inpatient Hospital Stay (HOSPITAL_BASED_OUTPATIENT_CLINIC_OR_DEPARTMENT_OTHER): Payer: 59 | Admitting: Internal Medicine

## 2023-01-19 DIAGNOSIS — C3431 Malignant neoplasm of lower lobe, right bronchus or lung: Secondary | ICD-10-CM | POA: Diagnosis not present

## 2023-01-19 DIAGNOSIS — Z510 Encounter for antineoplastic radiation therapy: Secondary | ICD-10-CM | POA: Diagnosis not present

## 2023-01-19 LAB — CBC WITH DIFFERENTIAL (CANCER CENTER ONLY)
Abs Immature Granulocytes: 0.03 10*3/uL (ref 0.00–0.07)
Basophils Absolute: 0 10*3/uL (ref 0.0–0.1)
Basophils Relative: 0 %
Eosinophils Absolute: 0 10*3/uL (ref 0.0–0.5)
Eosinophils Relative: 0 %
HCT: 49.1 % (ref 39.0–52.0)
Hemoglobin: 16.3 g/dL (ref 13.0–17.0)
Immature Granulocytes: 0 %
Lymphocytes Relative: 10 %
Lymphs Abs: 0.8 10*3/uL (ref 0.7–4.0)
MCH: 32.1 pg (ref 26.0–34.0)
MCHC: 33.2 g/dL (ref 30.0–36.0)
MCV: 96.7 fL (ref 80.0–100.0)
Monocytes Absolute: 1.1 10*3/uL — ABNORMAL HIGH (ref 0.1–1.0)
Monocytes Relative: 13 %
Neutro Abs: 6.2 10*3/uL (ref 1.7–7.7)
Neutrophils Relative %: 77 %
Platelet Count: 289 10*3/uL (ref 150–400)
RBC: 5.08 MIL/uL (ref 4.22–5.81)
RDW: 14.5 % (ref 11.5–15.5)
WBC Count: 8.2 10*3/uL (ref 4.0–10.5)
nRBC: 0 % (ref 0.0–0.2)

## 2023-01-19 LAB — RAD ONC ARIA SESSION SUMMARY
Course Elapsed Days: 0
Plan Fractions Treated to Date: 1
Plan Prescribed Dose Per Fraction: 1.8 Gy
Plan Total Fractions Prescribed: 33
Plan Total Prescribed Dose: 59.4 Gy
Reference Point Dosage Given to Date: 1.8 Gy
Reference Point Session Dosage Given: 1.8 Gy
Session Number: 1

## 2023-01-19 LAB — CMP (CANCER CENTER ONLY)
ALT: 8 U/L (ref 0–44)
AST: 12 U/L — ABNORMAL LOW (ref 15–41)
Albumin: 3.6 g/dL (ref 3.5–5.0)
Alkaline Phosphatase: 84 U/L (ref 38–126)
Anion gap: 7 (ref 5–15)
BUN: 14 mg/dL (ref 6–20)
CO2: 24 mmol/L (ref 22–32)
Calcium: 9.2 mg/dL (ref 8.9–10.3)
Chloride: 108 mmol/L (ref 98–111)
Creatinine: 0.87 mg/dL (ref 0.61–1.24)
GFR, Estimated: >60 mL/min (ref >60)
Glucose, Bld: 77 mg/dL (ref 70–99)
Potassium: 4.4 mmol/L (ref 3.5–5.1)
Sodium: 139 mmol/L (ref 135–145)
Total Bilirubin: 0.3 mg/dL (ref 0.3–1.2)
Total Protein: 6.8 g/dL (ref 6.5–8.1)

## 2023-01-19 NOTE — Progress Notes (Addendum)
I met this patient on 1/25 at his intial med onc consult with Dr Julien Nordmann. Today was supposed to be his first day of chemo-radiation, however, because he was not able to find a ride to his treatment, he wanted to know if his chemotherapy can be pushed to tomorrow. The arrangements were made by the infusion nursing staff. The patient is scheduled to have his chemo tomorrow at 8am followed by his radiation. I instructed the pt to check into the clinic by 7:45AM. Pt verbalized understanding.

## 2023-01-19 NOTE — Telephone Encounter (Signed)
Voicemail received from [employer] [phone] (home) (garbled).  "I'm returning a call.  The patient's name is Richard [name]." Connected with Reather Littler to inquire of patient's name and date of birth.   "FMLA is to care for my husband, Richard [name], D.O.B. December 30, 1970 is a patient there and I have RSV.  I need a continuous leave.  No, 01/07/2023 is not the start date.  He saw another doctor there.  I was out of work, returned to work and now need continuous leave.  He is there now." [place] may complete FMLA for care of spouse however you must obtain FMLA from your provider for your medical needs.   Form received yesterday lists you as the patient.  Connect with [employer], request correct form to care for spouse be sent to [facility].  Need a [place] signed authorization, [place] cover sheet with start date of leave and dates missed caring for spouse.  Will connect with patient to obtain ROI. Awaiting new [employer] form.

## 2023-01-19 NOTE — Progress Notes (Signed)
Pt. arrived for treatment, but needed to see MD first. Blood pressure elevated, pt. denies chest pain, dizziness, no shortness of breath noted. Pt. sent to MD appointment and RN notified of elevated BP.  Per pt. he is unable to stay today for treatment due to transportation issues. Treatment rescheduled for 01/20/23 at 0800. Pt. is aware and understands plan.

## 2023-01-19 NOTE — Progress Notes (Signed)
[place] [phone]   [phone]  OFFICE PROGRESS NOTE  Medicine, [facility] [address] [place] [address]  DIAGNOSIS: stage IIIb (T4, N2, M0) non-small cell lung cancer, poorly differentiated carcinoma with neuroendocrine features diagnosed in January 2024 and presented with large right lower lobe lung mass in addition to right hilar and mediastinal lymphadenopathy.  PRIOR THERAPY:  status post short course of palliative radiotherapy with 30 Gray completed in March 2023.   CURRENT THERAPY: A course of concurrent chemoradiation with weekly carboplatin for AUC of 2 and paclitaxel 45 Mg/M2   INTERVAL HISTORY: Richard [name] 52 y.o. male returns to the clinic today for follow-up visit.  The patient is feeling fine today with no concerning complaints except for the baseline shortness of breath and also uncontrolled hypertension.  He started radiotherapy yesterday and he was supposed to start the first cycle of his treatment with chemotherapy yesterday but he missed the appointment.  He is scheduled to receive it today but he also wants to go home because of transportation issues and interested in delaying his treatment until tomorrow.  He had MRI of the brain that showed no finding concerning for metastatic disease to the brain.  The patient denied having any chest pain but continues to have cough with no hemoptysis.  He has no nausea, vomiting, diarrhea or constipation.  He has no headache or visual changes.  He has no recent weight loss or night sweats.   MEDICAL HISTORY: Past Medical History:  Diagnosis Date   Asthma    Brain bleed ([place])    DVT (deep venous thrombosis) ([place]) 02/19/2015   RLE   GERD (gastroesophageal reflux disease)    History of home oxygen therapy    Hypertension    Seizures ([place])    last episode 03/2013   Stroke [facility])     ALLERGIES:  is allergic to dilaudid [hydromorphone hcl] and tape.  MEDICATIONS:   Current Outpatient Medications  Medication Sig Dispense Refill   albuterol (VENTOLIN HFA) 108 (90 Base) MCG/ACT inhaler Inhale 1-2 puffs into the lungs every 6 (six) hours as needed for wheezing or shortness of breath. 8 g 3   [name] 160-4.5 MCG/ACT inhaler Inhale 2 puffs into the lungs daily.     cloNIDine (CATAPRES) 0.3 MG tablet Take 1 tablet (0.3 mg total) by mouth 2 (two) times daily. (Patient not taking: Reported on 01/15/2023) 60 tablet 0   diclofenac Sodium (VOLTAREN) 1 % GEL Apply 4 g topically 4 (four) times daily. (Patient not taking: Reported on 01/15/2023)     levETIRAcetam (KEPPRA) 500 MG tablet Take 1 tablet (500 mg total) by mouth 2 (two) times daily.     losartan (COZAAR) 100 MG tablet Take 100 mg by mouth daily.     OXYGEN Inhale 2 L/min into the lungs daily as needed (for shortness of breath).     No current facility-administered medications for this visit.    SURGICAL HISTORY:  Past Surgical History:  Procedure Laterality Date   BRONCHIAL BIOPSY  02/10/2022   Procedure: BRONCHIAL BIOPSIES;  Surgeon: Rigoberto Noel, MD;  Location: [facility];  Service: Cardiopulmonary;;   BRONCHIAL BRUSHINGS  02/10/2022   Procedure: BRONCHIAL BRUSHINGS;  Surgeon: Rigoberto Noel, MD;  Location: [facility];  Service: Cardiopulmonary;;   BRONCHIAL NEEDLE ASPIRATION BIOPSY  02/10/2022   Procedure: BRONCHIAL NEEDLE ASPIRATION BIOPSIES;  Surgeon: Rigoberto Noel, MD;  Location: [facility];  Service: Cardiopulmonary;;  BRONCHIAL WASHINGS  02/10/2022   Procedure: BRONCHIAL WASHINGS;  Surgeon: Rigoberto Noel, MD;  Location: [facility];  Service: Cardiopulmonary;;   ENDOBRONCHIAL ULTRASOUND Bilateral 02/10/2022   Procedure: ENDOBRONCHIAL ULTRASOUND;  Surgeon: Rigoberto Noel, MD;  Location: [facility];  Service: Cardiopulmonary;  Laterality: Bilateral;   LEG SURGERY     VIDEO BRONCHOSCOPY  02/10/2022   Procedure: VIDEO BRONCHOSCOPY WITHOUT FLUORO;  Surgeon: Rigoberto Noel, MD;  Location: [facility];  Service: Cardiopulmonary;;    REVIEW OF SYSTEMS:  Constitutional: positive for fatigue Eyes: negative Ears, nose, mouth, throat, and face: negative Respiratory: positive for cough and dyspnea on exertion Cardiovascular: negative Gastrointestinal: negative Genitourinary:negative Integument/breast: negative Hematologic/lymphatic: negative Musculoskeletal:negative Neurological: negative Behavioral/Psych: negative Endocrine: negative Allergic/Immunologic: negative   PHYSICAL EXAMINATION: General appearance: alert, cooperative, fatigued, and no distress Head: Normocephalic, without obvious abnormality, atraumatic Neck: no adenopathy, no JVD, supple, symmetrical, trachea midline, and thyroid not enlarged, symmetric, no tenderness/mass/nodules Lymph nodes: Cervical, supraclavicular, and axillary nodes normal. Resp: diminished breath sounds RLL and dullness to percussion RLL Back: symmetric, no curvature. ROM normal. No CVA tenderness. Cardio: regular rate and rhythm, S1, S2 normal, no murmur, click, rub or gallop GI: soft, non-tender; bowel sounds normal; no masses,  no organomegaly Extremities: extremities normal, atraumatic, no cyanosis or edema Neurologic: Alert and oriented X 3, normal strength and tone. Normal symmetric reflexes. Normal coordination and gait  ECOG PERFORMANCE STATUS: 1 - Symptomatic but completely ambulatory  Blood pressure (!) 162/114, pulse (!) 109, temperature 98.6 F (37 C), temperature source Oral, resp. rate 18, SpO2 92 %.  LABORATORY DATA: Lab Results  Component Value Date   WBC 8.2 01/19/2023   HGB 16.3 01/19/2023   HCT 49.1 01/19/2023   MCV 96.7 01/19/2023   PLT 289 01/19/2023      Chemistry      Component Value Date/Time   NA 137 01/16/2023 0036   K 4.0 01/16/2023 0036   CL 106 01/16/2023 0036   CO2 24 01/16/2023 0036   BUN 12 01/16/2023 0036   CREATININE 0.85 01/16/2023 0036   CREATININE 0.97 01/07/2023 1339   CREATININE 0.91  12/05/2013 1605      Component Value Date/Time   CALCIUM 8.2 (L) 01/16/2023 0036   ALKPHOS 72 01/14/2023 2020   AST 21 01/14/2023 2020   AST 14 (L) 01/07/2023 1339   ALT 9 01/14/2023 2020   ALT 13 01/07/2023 1339   BILITOT 1.2 01/14/2023 2020   BILITOT 0.4 01/07/2023 1339       RADIOGRAPHIC STUDIES: CT VENOGRAM ABD/PELVIS/LOWER EXT BILAT  Result Date: 01/17/2023 CLINICAL DATA:  Right leg swelling.  May-Thurner suspected. EXAM: CT VENOGRAM ABDOMEN AND PELVIS AND LOWER EXTREMITY BILATERAL TECHNIQUE: RADIATION DOSE REDUCTION: This exam was performed according to the departmental dose-optimization program which includes automated exposure control, adjustment of the mA and/or kV according to patient size and/or use of iterative reconstruction technique. CONTRAST:  143mL OMNIPAQUE IOHEXOL 350 MG/ML SOLN COMPARISON:  CTA chest, abdomen and pelvis 10/12/2021. FINDINGS: Lower chest: No pleural fluid or airspace disease. Hepatobiliary: No focal liver abnormality is seen. No gallstones, gallbladder wall thickening, or biliary dilatation. Pancreas: Unremarkable. No pancreatic ductal dilatation or surrounding inflammatory changes. Spleen: Normal in size without focal abnormality. Adrenals/Urinary Tract: Normal right adrenal gland. Unchanged low-attenuation enlargement of the left adrenal gland which likely represents adenomatous hyperplasia. No follow-up imaging recommended. Simple appearing cyst arises off the inferior pole of the right kidney measuring 1.9 cm, image 34/6. No follow-up imaging recommended. Too small  to characterize low-density structures noted within the lower pole of the left kidney measuring 7 mm, image 34/6. no hydronephrosis identified bilaterally. Urinary bladder is unremarkable. Stomach/Bowel: Stomach appears normal. The appendix is visualized and is within normal limits. No bowel wall thickening, inflammation, or distension. Vascular/Lymphatic: Aortic atherosclerosis. No aneurysm. No  signs of abdominopelvic adenopathy. Reproductive: Prostate is unremarkable. Large left and moderate right hydroceles. Other: No free fluid or fluid collections. Musculoskeletal: Sclerotic lesion in the right iliac bone is stable compared with the previous exam and most likely represents a benign bone. Spondylosis identified within the lumbar spine. No acute or suspicious osseous findings. IVC: No evidence for thrombus or stenosis. Portal and mesenteric veins: No evidence for thrombus or stenosis. Bilateral iliac veins: No evidence for thrombus or stenosis. Left common iliac vein has a normal caliber and is widely patent. Right lower extremity: No evidence for thrombus involving the common femoral, femoral, popliteal and visualized deep calf veins Left lower extremity: No evidence for thrombus involving the common femoral, femoral, popliteal and visualized deep calf veins. Small left joint effusion is identified most notable along the lateral recess of the left knee. There is mild asymmetric soft tissue edema involving mid and distal left thigh extending to below the knee. Mild overlying skin thickening is noted. IMPRESSION: 1. No evidence for venous thrombosis within the abdomen, pelvis or bilateral lower extremities. 2. No signs of May-Thurner syndrome. 3. Mild asymmetric soft tissue edema involving the mid and distal left thigh extending to below the knee. Mild overlying skin thickening is noted. Correlate for any clinical signs or symptoms of cellulitis. 4. Small left knee joint effusion. 5. Bilateral hydroceles noted within the scrotum, left greater than right. 6.  Aortic Atherosclerosis (ICD10-I70.0). Electronically Signed   By: Kerby Moors M.D.   On: 01/17/2023 12:49   MR BRAIN WO CONTRAST  Result Date: 01/16/2023 CLINICAL DATA:  Acute stroke suspected EXAM: MRI HEAD WITHOUT CONTRAST TECHNIQUE: Multiplanar, multiecho pulse sequences of the brain and surrounding structures were obtained without  intravenous contrast. COMPARISON:  Brain MRI 12/26/2022 FINDINGS: Brain: No acute infarction, hemorrhage, hydrocephalus, extra-axial collection or mass lesion. Chronic, extensive left frontal encephalomalacia. Confluent chronic small vessel ischemia in the cerebral white matter and pons. Mild cerebral volume loss. Vascular: Normal flow voids. Skull and upper cervical spine: Normal marrow signal. Sinuses/Orbits: Chronic sinusitis with near complete opacification of the atelectatic left maxillary sinus. New opacification the right maxillary sinus. IMPRESSION: 1. No acute infarct. 2. Stable chronic small vessel ischemia and left frontal encephalomalacia. 3. Right maxillary sinusitis since 12/26/2022. Chronic left maxillary sinusitis. Electronically Signed   By: Jorje Guild M.D.   On: 01/16/2023 11:21   VAS [place] LOWER EXTREMITY VENOUS (DVT)  Result Date: 01/16/2023  Lower Venous DVT Study Patient Name:  Richard [name]  Date of Exam:   01/16/2023 Medical Rec #: [id number]     Accession #:    [id number] Date of Birth: 03/26/1971     Patient Gender: M Patient Age:   28 years Exam Location:  [facility] Procedure:      VAS [place] LOWER EXTREMITY VENOUS (DVT) Referring Phys: Lalla Brothers --------------------------------------------------------------------------------  Indications: Swelling.  Limitations: Significant pitting edema in the calf. Comparison Study: Prior negative left LEV done 12/26/22 Performing Technologist: Sharion Dove RVS  Examination Guidelines: A complete evaluation includes B-mode imaging, spectral Doppler, color Doppler, and power Doppler as needed of all accessible portions of each vessel. Bilateral testing is considered an integral part of a complete  examination. Limited examinations for reoccurring indications may be performed as noted. The reflux portion of the exam is performed with the patient in reverse Trendelenburg.  +-----+---------------+---------+-----------+----------+--------------+  RIGHTCompressibilityPhasicitySpontaneityPropertiesThrombus Aging +-----+---------------+---------+-----------+----------+--------------+ CFV  Full           Yes      Yes                                 +-----+---------------+---------+-----------+----------+--------------+   +---------+---------------+---------+-----------+----------+-------------------+ LEFT     CompressibilityPhasicitySpontaneityPropertiesThrombus Aging      +---------+---------------+---------+-----------+----------+-------------------+ CFV      Full           Yes      Yes                                      +---------+---------------+---------+-----------+----------+-------------------+ SFJ      Full                                                             +---------+---------------+---------+-----------+----------+-------------------+ FV Prox  Full                                                             +---------+---------------+---------+-----------+----------+-------------------+ FV Mid   Full                                                             +---------+---------------+---------+-----------+----------+-------------------+ FV DistalFull                                                             +---------+---------------+---------+-----------+----------+-------------------+ PFV      Full                                                             +---------+---------------+---------+-----------+----------+-------------------+ POP      Full           Yes      Yes                                      +---------+---------------+---------+-----------+----------+-------------------+ PTV      Full                                                             +---------+---------------+---------+-----------+----------+-------------------+  PERO                                                  Not well visualized  +---------+---------------+---------+-----------+----------+-------------------+    Summary: RIGHT: - No evidence of common femoral vein obstruction. Normal common femoral vein phasicity. Low suspicion for more proximal obstruction  LEFT: - Findings appear essentially unchanged compared to previous examination. - There is no evidence of deep vein thrombosis in the lower extremity.  - No cystic structure found in the popliteal fossa. Normal common femoral vein phasicity. Low suspicion for more proximal obstruction.  *See table(s) above for measurements and observations. Electronically signed by Orlie Pollen on 01/16/2023 at 10:47:12 AM.    Final    DG Chest 1 View  Result Date: 01/15/2023 CLINICAL DATA:  Short of breath, history of non-small cell lung cancer EXAM: CHEST  1 VIEW COMPARISON:  01/14/2023, 01/15/2023 FINDINGS: Single frontal view of the chest demonstrates a stable cardiac silhouette. Stable right hilar mass. Stable bilateral areas of scarring without airspace disease, effusion, or pneumothorax. No acute bony abnormality. IMPRESSION: 1. Stable right hilar mass. 2. Stable bilateral scarring, no acute airspace disease. Electronically Signed   By: Randa Ngo M.D.   On: 01/15/2023 21:44   CT Angio Chest Pulmonary Embolism (PE) W or WO Contrast  Result Date: 01/15/2023 CLINICAL DATA:  Right leg swelling and redness. Increasing cough and shortness of breath. Non-small-cell lung cancer. * Tracking Code: BO * EXAM: CT ANGIOGRAPHY CHEST WITH CONTRAST TECHNIQUE: Multidetector CT imaging of the chest was performed using the standard protocol during bolus administration of intravenous contrast. Multiplanar CT image reconstructions and MIPs were obtained to evaluate the vascular anatomy. RADIATION DOSE REDUCTION: This exam was performed according to the departmental dose-optimization program which includes automated exposure control, adjustment of the mA and/or kV according to patient size and/or use of  iterative reconstruction technique. CONTRAST:  33mL OMNIPAQUE IOHEXOL 350 MG/ML SOLN COMPARISON:  12/25/2022 FINDINGS: Cardiovascular: Heart size upper normal to mildly increased. No pericardial effusion. Ascending thoracic aorta measures 4.0 cm diameter. Enlargement of the pulmonary outflow tract/main pulmonary arteries suggests pulmonary arterial hypertension. There is no filling defect within the opacified pulmonary arteries to suggest the presence of an acute pulmonary embolus. Mediastinum/Nodes: Similar mediastinal lymphadenopathy including 12 mm short axis AP window lymph node and 9 mm short axis low right paratracheal node. Similar appearance of the retro hilar right lung mass measuring 7.7 x 4.2 cm today compared to 7.9 x 4.1 cm previously. This lesion generates mass-effect on the right mainstem bronchus and bronchus intermedius. Lungs/Pleura: Centrilobular and paraseptal emphysema evident. Retro hilar posterior right lung mass, as above. There is bronchial wall thickening and the lower lungs bilaterally with areas of peripheral small airway impaction in the right lower lobe. No suspicious pulmonary nodule or mass in the left lung. No pleural effusion. Upper Abdomen: Stable left adrenal nodule previously characterized as benign adenoma. No followup imaging is recommended. Musculoskeletal: No worrisome lytic or sclerotic osseous abnormality. Review of the MIP images confirms the above findings. IMPRESSION: 1. No CT evidence for acute pulmonary embolus. 2. Enlargement of the pulmonary outflow tract/main pulmonary arteries suggests pulmonary arterial hypertension. 3. Ascending thoracic aorta measuring up to 4.0 cm diameter on today's study. Recommend annual imaging followup by CTA or MRA. This recommendation follows 2010 ACCF/AHA/AATS/ACR/ASA/SCA/SCAI/SIR/STS/SVM Guidelines for the Diagnosis and  Management of Patients with Thoracic Aortic Disease. Circulation. 2010; 121: H299-M426. Aortic aneurysm NOS  (ICD10-I71.9) 4. Similar appearance of the known retro hilar lung mass involving the fissure. 5. Similar mediastinal lymphadenopathy. 6.  Emphysema (ICD10-J43.9). Electronically Signed   By: Misty Stanley M.D.   On: 01/15/2023 05:16   DG Chest Portable 1 View  Result Date: 01/14/2023 CLINICAL DATA:  Cough EXAM: PORTABLE CHEST 1 VIEW COMPARISON:  Chest x-ray 01/09/2023.  CT of the chest 12/25/2022 FINDINGS: Right suprahilar opacity is not well seen secondary to rotation, but is grossly unchanged corresponding to patient's mass on prior CT. There is some new minimal patchy opacities in the left lung base. The heart is mildly enlarged, unchanged. No pleural effusion or pneumothorax. No acute fractures. IMPRESSION: New minimal patchy opacities in the left lung base, which could represent atelectasis or early pneumonia. Otherwise stable exam. Electronically Signed   By: Ronney Asters M.D.   On: 01/14/2023 20:53   DG Knee 1-2 Views Left  Result Date: 01/11/2023 CLINICAL DATA:  Left lateral knee pain.  No known trauma. EXAM: LEFT KNEE - 1-2 VIEW COMPARISON:  None Available. FINDINGS: No visible joint effusion. No degenerative change of the weight-bearing compartment or patellofemoral joint. Slightly high positioning of the patella incidentally noted. Benign appearing cortical thickening of the anterior cortex of the proximal tibial diaphysis, not significant. IMPRESSION: 1. No acute finding to explain the clinical symptoms. No visible degenerative changes. 2. Mild patella alta. Electronically Signed   By: Nelson Chimes M.D.   On: 01/11/2023 08:21   DG Chest 1 View  Result Date: 01/09/2023 CLINICAL DATA:  Cough and shortness of breath starting a few days ago with worsening headaches and lightheadedness and weakness EXAM: CHEST  1 VIEW COMPARISON:  12/28/2022 FINDINGS: Similar appearance of the right hilar mass and architectural distortion in the right upper lung. Stable cardiomediastinal silhouette. No new focal  consolidation, pleural effusion, or pneumothorax. No acute osseous abnormality. IMPRESSION: No significant change from 12/28/2022. Right hilar mass and architectural distortion previously evaluated with CT 12/25/2022. Electronically Signed   By: Placido Sou M.D.   On: 01/09/2023 19:19   CT LUNG MASS BIOPSY  Result Date: 12/28/2022 INDICATION: Lung mass EXAM: CT-GUIDED RIGHT LUNG MASS BIOPSY COMPARISON:  CTA PE and chest XR, 12/25/2022. MEDICATIONS: None. ANESTHESIA/SEDATION: Moderate (conscious) sedation was employed during this procedure. A total of Versed 2 mg and Fentanyl 100 mcg was administered intravenously. Moderate Sedation Time: 25 minutes. The patient's level of consciousness and vital signs were monitored continuously by radiology nursing throughout the procedure under my direct supervision. CONTRAST:  None COMPLICATIONS: None immediate. PROCEDURE: RADIATION DOSE REDUCTION: This exam was performed according to the departmental dose-optimization program which includes automated exposure control, adjustment of the mA and/or kV according to patient size and/or use of iterative reconstruction technique. Informed consent was obtained from the patient following an explanation of the procedure, risks, benefits and alternatives. The patient understands,agrees and consents for the procedure. All questions were addressed. A time out was performed prior to the initiation of the procedure. The patient was positioned prone on the CT table and a limited chest CT was performed for procedural planning demonstrating RIGHT posterior lung mass. The operative site was prepped and draped in the usual sterile fashion. Under sterile conditions and local anesthesia, a 17 gauge coaxial needle was advanced into the peripheral aspect of the nodule. Positioning was confirmed with intermittent CT fluoroscopy and followed by the acquisition of 3 course with an 18  gauge core needle biopsy device. The coaxial needle was removed  following hemostatic patch and superficial hemostasis was achieved with manual compression. Limited post procedural chest CT was negative for pneumothorax or additional complication. A dressing was placed. The patient tolerated the procedure well without immediate postprocedural complication. The patient was escorted to have an upright chest radiograph. IMPRESSION: Successful CT guided core needle core biopsy of RIGHT lung mass. Michaelle Birks, MD [facility] Electronically Signed   By: Michaelle Birks M.D.   On: 12/28/2022 16:10   DG Chest Port 1 View  Result Date: 12/28/2022 CLINICAL DATA:  Post biopsy EXAM: PORTABLE CHEST 1 VIEW COMPARISON:  CT 12/25/2022, chest x-ray 12/25/2022, CT 06/10/2022 FINDINGS: Right hilar mass and distortion. No pleural effusion or visible pneumothorax. Stable cardiac size. IMPRESSION: Negative for pneumothorax or pleural effusion. Right hilar mass and distortion, reference chest CT 12/25/2022. Electronically Signed   By: Donavan Foil M.D.   On: 12/28/2022 15:52   MR BRAIN W WO CONTRAST  Result Date: 12/26/2022 CLINICAL DATA:  Lung cancer staging EXAM: MRI HEAD WITHOUT AND WITH CONTRAST TECHNIQUE: Multiplanar, multiecho pulse sequences of the brain and surrounding structures were obtained without and with intravenous contrast. CONTRAST:  2mL GADAVIST GADOBUTROL 1 MMOL/ML IV SOLN COMPARISON:  02/03/2022 FINDINGS: Brain: No enhancement or swelling to suggest metastatic disease. Extensive chronic small vessel ischemia in the cerebral white matter, confluent around the lateral ventricles. Unchanged large area of encephalomalacia in the anterior left frontal lobe. Stable brain volume. No acute infarct, hydrocephalus, or collection. Vascular: Major flow voids and vascular enhancements are preserved Skull and upper cervical spine: Curved no focal marrow lesion. Sinuses/Orbits: Chronic left maxillary sinusitis with wall thickening  and complete opacification. Negative orbits. IMPRESSION: Stable compared to February 2023. Negative for metastatic disease to the brain. Electronically Signed   By: Jorje Guild M.D.   On: 12/26/2022 17:19   VAS [place] LOWER EXTREMITY VENOUS (DVT)  Result Date: 12/26/2022  Lower Venous DVT Study Patient Name:  Richard [name]  Date of Exam:   12/26/2022 Medical Rec #: [id number]     Accession #:    [id number] Date of Birth: 1971/07/03     Patient Gender: M Patient Age:   12 years Exam Location:  [facility] Procedure:      VAS [place] LOWER EXTREMITY VENOUS (DVT) Referring Phys: JULIE MACHEN --------------------------------------------------------------------------------  Indications: Swelling, and Pain.  Risk Factors: Cancer of the lung diagnosed 2021 no follow up. Comparison Study: Prior negative bilateral LEV done 02/03/22 Performing Technologist: Sharion Dove RVS  Examination Guidelines: A complete evaluation includes B-mode imaging, spectral Doppler, color Doppler, and power Doppler as needed of all accessible portions of each vessel. Bilateral testing is considered an integral part of a complete examination. Limited examinations for reoccurring indications may be performed as noted. The reflux portion of the exam is performed with the patient in reverse Trendelenburg.  +-----+---------------+---------+-----------+----------+--------------+ RIGHTCompressibilityPhasicitySpontaneityPropertiesThrombus Aging +-----+---------------+---------+-----------+----------+--------------+ CFV  Full           Yes      Yes                                 +-----+---------------+---------+-----------+----------+--------------+   +---------+---------------+---------+-----------+----------+--------------+ LEFT     CompressibilityPhasicitySpontaneityPropertiesThrombus Aging +---------+---------------+---------+-----------+----------+--------------+ CFV      Full           Yes      Yes                                  +---------+---------------+---------+-----------+----------+--------------+  SFJ      Full                                                        +---------+---------------+---------+-----------+----------+--------------+ FV Prox  Full                                                        +---------+---------------+---------+-----------+----------+--------------+ FV Mid   Full                                                        +---------+---------------+---------+-----------+----------+--------------+ FV DistalFull                                                        +---------+---------------+---------+-----------+----------+--------------+ PFV      Full                                                        +---------+---------------+---------+-----------+----------+--------------+ POP      Full           Yes      Yes                                 +---------+---------------+---------+-----------+----------+--------------+ PTV      Full                                                        +---------+---------------+---------+-----------+----------+--------------+ PERO     Full                                                        +---------+---------------+---------+-----------+----------+--------------+     Summary: RIGHT: - No evidence of common femoral vein obstruction.  LEFT: - There is no evidence of deep vein thrombosis in the lower extremity.  - No cystic structure found in the popliteal fossa.  *See table(s) above for measurements and observations. Electronically signed by Monica Martinez MD on 12/26/2022 at 11:45:54 AM.    Final    ECHOCARDIOGRAM COMPLETE  Result Date: 12/26/2022    ECHOCARDIOGRAM REPORT   Patient Name:   Richard [name] Date of Exam: 12/26/2022 Medical Rec #:  [id number]    Height:       69.0 in Accession #:    [id number]  Weight:       225.0 lb Date of Birth:  08/20/1971    BSA:          2.172 m Patient Age:     63 years     BP:           114/76 mmHg Patient Gender: M            HR:           92 bpm. Exam Location:  Inpatient Procedure: 2D Echo, Cardiac Doppler and Color Doppler Indications:    Dyspnea  History:        Patient has prior history of Echocardiogram examinations, most                 recent 02/04/2022. COPD; Risk Factors:Hypertension and Current                 Smoker. Lung cancer. Hx TIA.  Sonographer:    Clayton Lefort RDCS (AE) Referring Phys: [place]  1. Left ventricular ejection fraction, by estimation, is 60 to 65%. The left ventricle has normal function. The left ventricle has no regional wall motion abnormalities. There is moderate concentric left ventricular hypertrophy. Left ventricular diastolic parameters are consistent with Grade I diastolic dysfunction (impaired relaxation).  2. Right ventricular systolic function is normal. The right ventricular size is normal.  3. The mitral valve is normal in structure. No evidence of mitral valve regurgitation. No evidence of mitral stenosis.  4. The aortic valve has an indeterminant number of cusps. Aortic valve regurgitation is mild. No aortic stenosis is present.  5. The inferior vena cava is normal in size with greater than 50% respiratory variability, suggesting right atrial pressure of 3 mmHg. Comparison(s): No significant change from prior study. FINDINGS  Left Ventricle: Left ventricular ejection fraction, by estimation, is 60 to 65%. The left ventricle has normal function. The left ventricle has no regional wall motion abnormalities. The left ventricular internal cavity size was normal in size. There is  moderate concentric left ventricular hypertrophy. Left ventricular diastolic parameters are consistent with Grade I diastolic dysfunction (impaired relaxation). Right Ventricle: The right ventricular size is normal. Right ventricular systolic function is normal. Left Atrium: Left atrial size was normal in size. Right Atrium: Right  atrial size was normal in size. Pericardium: Trivial pericardial effusion is present. Mitral Valve: The mitral valve is normal in structure. No evidence of mitral valve regurgitation. No evidence of mitral valve stenosis. Tricuspid Valve: The tricuspid valve is normal in structure. Tricuspid valve regurgitation is trivial. No evidence of tricuspid stenosis. Aortic Valve: The aortic valve has an indeterminant number of cusps. Aortic valve regurgitation is mild. Aortic regurgitation PHT measures 409 msec. No aortic stenosis is present. Aortic valve mean gradient measures 2.0 mmHg. Aortic valve peak gradient measures 3.8 mmHg. Aortic valve area, by VTI measures 4.38 cm. Pulmonic Valve: The pulmonic valve was normal in structure. Pulmonic valve regurgitation is not visualized. No evidence of pulmonic stenosis. Aorta: The aortic root is normal in size and structure. Venous: The inferior vena cava is normal in size with greater than 50% respiratory variability, suggesting right atrial pressure of 3 mmHg. IAS/Shunts: No atrial level shunt detected by color flow Doppler.  LEFT VENTRICLE PLAX 2D LVIDd:         4.30 cm   Diastology LVIDs:         3.00 cm   LV e' medial:    4.79 cm/s LV PW:  2.00 cm   LV E/e' medial:  13.5 LV IVS:        1.40 cm   LV e' lateral:   7.72 cm/s LVOT diam:     2.30 cm   LV E/e' lateral: 8.4 LV SV:         71 LV SV Index:   33 LVOT Area:     4.15 cm  RIGHT VENTRICLE             IVC RV Basal diam:  3.30 cm     IVC diam: 1.50 cm RV S prime:     16.50 cm/s TAPSE (M-mode): 1.9 cm LEFT ATRIUM             Index        RIGHT ATRIUM           Index LA diam:        2.80 cm 1.29 cm/m   RA Area:     13.40 cm LA Vol (A2C):   35.6 ml 16.39 ml/m  RA Volume:   32.70 ml  15.05 ml/m LA Vol (A4C):   28.3 ml 13.03 ml/m LA Biplane Vol: 32.2 ml 14.82 ml/m  AORTIC VALVE AV Area (Vmax):    4.26 cm AV Area (Vmean):   4.31 cm AV Area (VTI):     4.38 cm AV Vmax:           97.00 cm/s AV Vmean:           66.700 cm/s AV VTI:            0.163 m AV Peak Grad:      3.8 mmHg AV Mean Grad:      2.0 mmHg LVOT Vmax:         99.50 cm/s LVOT Vmean:        69.200 cm/s LVOT VTI:          0.172 m LVOT/AV VTI ratio: 1.06 AI PHT:            409 msec  AORTA Ao Root diam: 3.90 cm MITRAL VALVE MV Area (PHT): 3.21 cm    SHUNTS MV Decel Time: 236 msec    Systemic VTI:  0.17 m MV E velocity: 64.60 cm/s  Systemic Diam: 2.30 cm MV A velocity: 98.50 cm/s MV E/A ratio:  0.66 Kirk Ruths MD Electronically signed by Kirk Ruths MD Signature Date/Time: 12/26/2022/11:34:31 AM    Final    CT Angio Chest PE W and/or Wo Contrast  Result Date: 12/25/2022 CLINICAL DATA:  Shortness of breath and hypoxia. History of lung carcinoma. EXAM: CT ANGIOGRAPHY CHEST WITH CONTRAST TECHNIQUE: Multidetector CT imaging of the chest was performed using the standard protocol during bolus administration of intravenous contrast. Multiplanar CT image reconstructions and MIPs were obtained to evaluate the vascular anatomy. RADIATION DOSE REDUCTION: This exam was performed according to the departmental dose-optimization program which includes automated exposure control, adjustment of the mA and/or kV according to patient size and/or use of iterative reconstruction technique. CONTRAST:  32mL OMNIPAQUE IOHEXOL 350 MG/ML SOLN COMPARISON:  CT of the chest on 06/10/2022 and prior CTA on 02/02/2022 FINDINGS: Cardiovascular: Pulmonary arteries are adequately opacified. There is no evidence of pulmonary embolism. Stable main pulmonary artery dilatation measuring up to 3.6 cm. Stable dilated ascending thoracic aorta measuring up to 4.3 cm. No evidence of aortic dissection. Normal heart size. No visualized calcified coronary artery plaque. No pericardial fluid identified. Mediastinum/Nodes: AP window node on image 49 shows enlargement measuring 11  mm in short axis compared to 8 mm previously. Right lower paratracheal lymph node demonstrates interval enlargement  measuring approximately 12 mm in short axis compared to 10 mm previously. Subcarinal lymph node difficult to measure as it abuts tumor but likely stable in size measuring roughly 15 mm. There are multiple other scattered smaller mediastinal nodes. Lungs/Pleura: Posterior right upper lobe mass abutting the posterior hilum and posterior mediastinum may be slightly increased in size since the prior study with current measured dimensions of approximately 7.9 x 4.1 x 4.9 cm (previously 7.2 x 4.1 x 4.8 cm). The satellite nodule more superiorly in the right upper lobe appears stable in size measuring approximately 1 cm. No further definite visualized smaller nodules in the right upper lobe. Lungs demonstrate generalized septal thickening since the prior study with scattered areas of ground-glass opacity and associated pulmonary venous distension. Findings are suggestive of mild pulmonary interstitial edema. No associated pleural fluid, focal airspace consolidation or pneumothorax. Upper Abdomen: Stable low-density left adrenal adenoma measuring up to roughly 2.7 cm. This has previously been demonstrated to have long-term stability and does not require follow-up. Musculoskeletal: No bony lesions or fractures identified. Review of the MIP images confirms the above findings. IMPRESSION: 1. No evidence of pulmonary embolism. 2. Stable main pulmonary artery dilatation consistent with pulmonary hypertension. 3. Stable dilated ascending thoracic aorta measuring 4.3 cm. Recommend annual imaging followup by CTA or MRA. This recommendation follows 2010 ACCF/AHA/AATS/ACR/ASA/SCA/SCAI/SIR/STS/SVM Guidelines for the Diagnosis and Management of Patients with Thoracic Aortic Disease. Circulation. 2010; 121: H209-O709. Aortic aneurysm NOS (ICD10-I71.9) 4. Mild pulmonary interstitial edema without pleural effusions. 5. Slight enlargement of the posterior right upper lobe mass abutting the posterior hilum and posterior mediastinum. The  satellite nodule more superiorly in the right upper lobe appears stable in size measuring approximately 1 cm. No further definite visualized smaller nodules in the right upper lobe. 6. Interval enlargement of AP window and right lower paratracheal lymph nodes since the prior study. This finding as well as slight enlargement of the right perihilar upper lobe mass is suggestive of progression of malignancy. Recommend follow-up with Oncology and consideration of additional follow-up PET imaging. 7. Stable low-density left adrenal adenoma. This has previously been demonstrated to have long-term stability and does not require follow-up. Electronically Signed   By: Aletta Edouard M.D.   On: 12/25/2022 08:29   DG Chest 2 View  Result Date: 12/25/2022 CLINICAL DATA:  52 year old male with history of chest pain and shortness of breath. EXAM: CHEST - 2 VIEW COMPARISON:  Chest x-ray 02/02/2022.  Chest CT 06/10/2022. FINDINGS: When compared to the prior chest radiograph the right upper lobe pulmonary mass appears slightly regressed, but there is persistent soft tissue fullness in the medial right upper lobe as well as the right suprahilar region, with surrounding interstitial prominence and architectural distortion, likely to reflect a combination of evolving postradiation changes, and potentially residual tumor (difficult to assess on today's plain film examination). Left lung is clear. No pleural effusions. No pneumothorax. No evidence of pulmonary edema. Heart size is mildly enlarged. IMPRESSION: 1. No definite radiographic evidence of acute cardiopulmonary disease. 2. Evidence of treated right upper lobe neoplasm. 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 Residual neoplasm is not excluded. Follow-up contrast-enhanced chest CT should be considered in the near future when clinically appropriate to re-evaluate these findings. Electronically Signed   By: Vinnie Langton M.D.    On: 12/25/2022 05:46    ASSESSMENT AND PLAN: This is  a very pleasant 52 years old African-American male with stage IIIb (T4, N2, M0) non-small cell lung cancer, poorly differentiated carcinoma with neuroendocrine features diagnosed in January 2024 and presented with large right lower lobe lung mass in addition to right hilar and mediastinal lymphadenopathy.  He is status post short course of palliative radiotherapy with 30 Pearline Cables completed in March 2023. The patient was supposed to start a course of concurrent chemoradiation with weekly carboplatin for AUC of 2 and paclitaxel 45 Mg/M2 yesterday and then today but he has transportation issues and he would like to go back home because he is driving somebody else's car.  He requested to delay his treatment until tomorrow.  I will work with the chemo room staff to see if we can change his treatment to tomorrow.  If not possible we will cancel this we can start again on January 25, 2023. The patient will come back for follow-up visit and 2 weeks for evaluation and management of any adverse effect of his treatment. He had MRI of the brain that was negative for malignancy and I discussed the result with the patient today. The patient was advised to call immediately if he has any other concerning symptoms in the interval. The patient voices understanding of current disease status and treatment options and is in agreement with the current care plan.  All questions were answered. The patient knows to call the clinic with any problems, questions or concerns. We can certainly see the patient much sooner if necessary.  The total time spent in the appointment was 30 minutes.  Disclaimer: This note was dictated with voice recognition software. Similar sounding words can inadvertently be transcribed and may not be corrected upon review.

## 2023-01-20 ENCOUNTER — Other Ambulatory Visit: Payer: Self-pay | Admitting: Medical Oncology

## 2023-01-20 ENCOUNTER — Ambulatory Visit: Admission: RE | Admit: 2023-01-20 | Payer: 59 | Source: Ambulatory Visit

## 2023-01-20 ENCOUNTER — Inpatient Hospital Stay: Payer: 59

## 2023-01-20 VITALS — BP 149/101 | HR 95 | Temp 98.1°F | Resp 18 | Wt 205.5 lb

## 2023-01-20 DIAGNOSIS — C3431 Malignant neoplasm of lower lobe, right bronchus or lung: Secondary | ICD-10-CM

## 2023-01-20 DIAGNOSIS — Z510 Encounter for antineoplastic radiation therapy: Secondary | ICD-10-CM | POA: Diagnosis not present

## 2023-01-20 DIAGNOSIS — R112 Nausea with vomiting, unspecified: Secondary | ICD-10-CM

## 2023-01-20 MED ORDER — SODIUM CHLORIDE 0.9 % IV SOLN
10.0000 mg | Freq: Once | INTRAVENOUS | Status: AC
  Administered 2023-01-20: 10 mg via INTRAVENOUS
  Filled 2023-01-20: qty 10

## 2023-01-20 MED ORDER — PROCHLORPERAZINE MALEATE 10 MG PO TABS: 10.0000 mg | ORAL_TABLET | Freq: Four times a day (QID) | ORAL | 0 refills | Status: DC | PRN

## 2023-01-20 MED ORDER — PALONOSETRON HCL INJECTION 0.25 MG/5ML
0.2500 mg | Freq: Once | INTRAVENOUS | Status: AC
  Administered 2023-01-20: 0.25 mg via INTRAVENOUS
  Filled 2023-01-20: qty 5

## 2023-01-20 MED ORDER — SODIUM CHLORIDE 0.9 % IV SOLN
45.0000 mg/m2 | Freq: Once | INTRAVENOUS | Status: AC
  Administered 2023-01-20: 96 mg via INTRAVENOUS
  Filled 2023-01-20: qty 16

## 2023-01-20 MED ORDER — SODIUM CHLORIDE 0.9 % IV SOLN: Freq: Once | INTRAVENOUS | Status: AC

## 2023-01-20 MED ORDER — DIPHENHYDRAMINE HCL 50 MG/ML IJ SOLN
50.0000 mg | Freq: Once | INTRAMUSCULAR | Status: AC
  Administered 2023-01-20: 50 mg via INTRAVENOUS
  Filled 2023-01-20: qty 1

## 2023-01-20 MED ORDER — FAMOTIDINE IN NACL 20-0.9 MG/50ML-% IV SOLN
20.0000 mg | Freq: Once | INTRAVENOUS | Status: AC
  Administered 2023-01-20: 20 mg via INTRAVENOUS
  Filled 2023-01-20: qty 50

## 2023-01-20 MED ORDER — SODIUM CHLORIDE 0.9 % IV SOLN
278.4000 mg | Freq: Once | INTRAVENOUS | Status: AC
  Administered 2023-01-20: 300 mg via INTRAVENOUS
  Filled 2023-01-20: qty 30

## 2023-01-20 NOTE — Patient Instructions (Signed)
[facility]  Discharge Instructions: Thank you for choosing [facility] to provide your oncology and hematology care.   If you have a lab appointment with the [facility], please go directly to the [facility] and check in at the registration area.   Wear comfortable clothing and clothing appropriate for easy access to any Portacath or PICC line.   We strive to give you quality time with your provider. You may need to reschedule your appointment if you arrive late (15 or more minutes).  Arriving late affects you and other patients whose appointments are after yours.  Also, if you miss three or more appointments without notifying the office, you may be dismissed from the clinic at the provider's discretion.      For prescription refill requests, have your pharmacy contact our office and allow 72 hours for refills to be completed.    Today you received the following chemotherapy and/or immunotherapy agents: Paclitaxel and Carboplatin      To help prevent nausea and vomiting after your treatment, we encourage you to take your nausea medication as directed.  BELOW ARE SYMPTOMS THAT SHOULD BE REPORTED IMMEDIATELY: *FEVER GREATER THAN 100.4 F (38 C) OR HIGHER *CHILLS OR SWEATING *NAUSEA AND VOMITING THAT IS NOT CONTROLLED WITH YOUR NAUSEA MEDICATION *UNUSUAL SHORTNESS OF BREATH *UNUSUAL BRUISING OR BLEEDING *URINARY PROBLEMS (pain or burning when urinating, or frequent urination) *BOWEL PROBLEMS (unusual diarrhea, constipation, pain near the anus) TENDERNESS IN MOUTH AND THROAT WITH OR WITHOUT PRESENCE OF ULCERS (sore throat, sores in mouth, or a toothache) UNUSUAL RASH, SWELLING OR PAIN  UNUSUAL VAGINAL DISCHARGE OR ITCHING   Items with * indicate a potential emergency and should be followed up as soon as possible or go to the Emergency Department if any problems should occur.  Please show the CHEMOTHERAPY ALERT CARD or IMMUNOTHERAPY  ALERT CARD at check-in to the Emergency Department and triage nurse.  Should you have questions after your visit or need to cancel or reschedule your appointment, please contact [facility]  Dept: [phone]  and follow the prompts.  Office hours are 8:00 a.m. to 4:30 p.m. Monday - Friday. Please note that voicemails left after 4:00 p.m. may not be returned until the following business day.  We are closed weekends and major holidays. You have access to a nurse at all times for urgent questions. Please call the main number to the clinic Dept: [phone] and follow the prompts.   For any non-urgent questions, you may also contact your provider using MyChart. We now offer e-Visits for anyone 15 and older to request care online for non-urgent symptoms. For details visit [email or web address].   Also download the MyChart app! Go to the app store, search "MyChart", open the app, select [facility], and log in with your MyChart username and password.  Paclitaxel Injection What is this medication? PACLITAXEL (PAK li TAX el) treats some types of cancer. It works by slowing down the growth of cancer cells. This medicine may be used for other purposes; ask your health care provider or pharmacist if you have questions. COMMON BRAND NAME(S): Onxol, Taxol What should I tell my care team before I take this medication? They need to know if you have any of these conditions: Heart disease Liver disease Low white blood cell levels An unusual or allergic reaction to paclitaxel, other medications, foods, dyes, or preservatives If you or your partner are pregnant or trying  to get pregnant Breast-feeding How should I use this medication? This medication is injected into a vein. It is given by your care team in a hospital or clinic setting. Talk to your care team about the use of this medication in children. While it may be given to children for selected conditions,  precautions do apply. Overdosage: If you think you have taken too much of this medicine contact a poison control center or emergency room at once. NOTE: This medicine is only for you. Do not share this medicine with others. What if I miss a dose? Keep appointments for follow-up doses. It is important not to miss your dose. Call your care team if you are unable to keep an appointment. What may interact with this medication? Do not take this medication with any of the following: Live virus vaccines Other medications may affect the way this medication works. Talk with your care team about all of the medications you take. They may suggest changes to your treatment plan to lower the risk of side effects and to make sure your medications work as intended. This list may not describe all possible interactions. Give your health care provider a list of all the medicines, herbs, non-prescription drugs, or dietary supplements you use. Also tell them if you smoke, drink alcohol, or use illegal drugs. Some items may interact with your medicine. What should I watch for while using this medication? Your condition will be monitored carefully while you are receiving this medication. You may need blood work while taking this medication. This medication may make you feel generally unwell. This is not uncommon as chemotherapy can affect healthy cells as well as cancer cells. Report any side effects. Continue your course of treatment even though you feel ill unless your care team tells you to stop. This medication can cause serious allergic reactions. To reduce the risk, your care team may give you other medications to take before receiving this one. Be sure to follow the directions from your care team. This medication may increase your risk of getting an infection. Call your care team for advice if you get a fever, chills, sore throat, or other symptoms of a cold or flu. Do not treat yourself. Try to avoid being around  people who are sick. This medication may increase your risk to bruise or bleed. Call your care team if you notice any unusual bleeding. Be careful brushing or flossing your teeth or using a toothpick because you may get an infection or bleed more easily. If you have any dental work done, tell your dentist you are receiving this medication. Talk to your care team if you may be pregnant. Serious birth defects can occur if you take this medication during pregnancy. Talk to your care team before breastfeeding. Changes to your treatment plan may be needed. What side effects may I notice from receiving this medication? Side effects that you should report to your care team as soon as possible: Allergic reactions--skin rash, itching, hives, swelling of the face, lips, tongue, or throat Heart rhythm changes--fast or irregular heartbeat, dizziness, feeling faint or lightheaded, chest pain, trouble breathing Increase in blood pressure Infection--fever, chills, cough, sore throat, wounds that don't heal, pain or trouble when passing urine, general feeling of discomfort or being unwell Low blood pressure--dizziness, feeling faint or lightheaded, blurry vision Low red blood cell level--unusual weakness or fatigue, dizziness, headache, trouble breathing Painful swelling, warmth, or redness of the skin, blisters or sores at the infusion site Pain, tingling, or  numbness in the hands or feet Slow heartbeat--dizziness, feeling faint or lightheaded, confusion, trouble breathing, unusual weakness or fatigue Unusual bruising or bleeding Side effects that usually do not require medical attention (report to your care team if they continue or are bothersome): Diarrhea Hair loss Joint pain Loss of appetite Muscle pain Nausea Vomiting This list may not describe all possible side effects. Call your doctor for medical advice about side effects. You may report side effects to FDA at 1-800-FDA-1088. Where should I keep  my medication? This medication is given in a hospital or clinic. It will not be stored at home. NOTE: This sheet is a summary. It may not cover all possible information. If you have questions about this medicine, talk to your doctor, pharmacist, or health care provider.  2023 Elsevier/Gold Standard (2022-04-01 00:00:00)  Carboplatin Injection What is this medication? CARBOPLATIN (KAR boe pla tin) treats some types of cancer. It works by slowing down the growth of cancer cells. This medicine may be used for other purposes; ask your health care provider or pharmacist if you have questions. COMMON BRAND NAME(S): Paraplatin What should I tell my care team before I take this medication? They need to know if you have any of these conditions: Blood disorders Hearing problems Kidney disease Recent or ongoing radiation therapy An unusual or allergic reaction to carboplatin, cisplatin, other medications, foods, dyes, or preservatives Pregnant or trying to get pregnant Breast-feeding How should I use this medication? This medication is injected into a vein. It is given by your care team in a hospital or clinic setting. Talk to your care team about the use of this medication in children. Special care may be needed. Overdosage: If you think you have taken too much of this medicine contact a poison control center or emergency room at once. NOTE: This medicine is only for you. Do not share this medicine with others. What if I miss a dose? Keep appointments for follow-up doses. It is important not to miss your dose. Call your care team if you are unable to keep an appointment. What may interact with this medication? Medications for seizures Some antibiotics, such as amikacin, gentamicin, neomycin, streptomycin, tobramycin Vaccines This list may not describe all possible interactions. Give your health care provider a list of all the medicines, herbs, non-prescription drugs, or dietary supplements you  use. Also tell them if you smoke, drink alcohol, or use illegal drugs. Some items may interact with your medicine. What should I watch for while using this medication? Your condition will be monitored carefully while you are receiving this medication. You may need blood work while taking this medication. This medication may make you feel generally unwell. This is not uncommon, as chemotherapy can affect healthy cells as well as cancer cells. Report any side effects. Continue your course of treatment even though you feel ill unless your care team tells you to stop. In some cases, you may be given additional medications to help with side effects. Follow all directions for their use. This medication may increase your risk of getting an infection. Call your care team for advice if you get a fever, chills, sore throat, or other symptoms of a cold or flu. Do not treat yourself. Try to avoid being around people who are sick. Avoid taking medications that contain aspirin, acetaminophen, ibuprofen, naproxen, or ketoprofen unless instructed by your care team. These medications may hide a fever. Be careful brushing or flossing your teeth or using a toothpick because you may get  an infection or bleed more easily. If you have any dental work done, tell your dentist you are receiving this medication. Talk to your care team if you wish to become pregnant or think you might be pregnant. This medication can cause serious birth defects. Talk to your care team about effective forms of contraception. Do not breast-feed while taking this medication. What side effects may I notice from receiving this medication? Side effects that you should report to your care team as soon as possible: Allergic reactions--skin rash, itching, hives, swelling of the face, lips, tongue, or throat Infection--fever, chills, cough, sore throat, wounds that don't heal, pain or trouble when passing urine, general feeling of discomfort or being  unwell Low red blood cell level--unusual weakness or fatigue, dizziness, headache, trouble breathing Pain, tingling, or numbness in the hands or feet, muscle weakness, change in vision, confusion or trouble speaking, loss of balance or coordination, trouble walking, seizures Unusual bruising or bleeding Side effects that usually do not require medical attention (report to your care team if they continue or are bothersome): Hair loss Nausea Unusual weakness or fatigue Vomiting This list may not describe all possible side effects. Call your doctor for medical advice about side effects. You may report side effects to FDA at 1-800-FDA-1088. Where should I keep my medication? This medication is given in a hospital or clinic. It will not be stored at home. NOTE: This sheet is a summary. It may not cover all possible information. If you have questions about this medicine, talk to your doctor, pharmacist, or health care provider.  2023 Elsevier/Gold Standard (2022-03-16 00:00:00)

## 2023-01-20 NOTE — Progress Notes (Signed)
Per Dr Julien Nordmann ok to treat with manual bp 150/110

## 2023-01-20 NOTE — Telephone Encounter (Signed)
Receptionist called this nurse reporting Richard [name] spouse Richard [name] in lobby to sign forms.   To lobby to discuss FMLA with spouse as again today received [employer] form for "[place]" and need "[place]".  ROI also incorrectly completed.  "Does this include payment.  [employer] said the form is due tomorrow.  I was out of work for his admissions in January.  Have been out of work since 01/14/2023.  Don't know if I'll need two days a week or what.  Just need coverage when I need to be out.  I work Water engineer, 8-hour shifts five days a week, every other weekend, off on Monday or a Wednesday around the weekend I work."  Reviewed Cover Sheet to request combination of continuous and intermittent leaves.  Advised a new request may be requested to update or change at any time.  Met Richard [name] in infusion treatment room to sign [facility] ROI.  Advised spouse to contact [employer] for disability payment information.  [employer] manages leave time only. Denies further questions or needs.

## 2023-01-21 ENCOUNTER — Other Ambulatory Visit: Payer: Self-pay

## 2023-01-21 ENCOUNTER — Telehealth: Payer: Self-pay

## 2023-01-21 ENCOUNTER — Ambulatory Visit
Admission: RE | Admit: 2023-01-21 | Discharge: 2023-01-21 | Disposition: A | Discharge status: Home / Self Care | Payer: 59 | Source: Ambulatory Visit | Attending: Radiation Oncology | Admitting: Radiation Oncology

## 2023-01-21 ENCOUNTER — Telehealth (HOSPITAL_COMMUNITY): Payer: Self-pay

## 2023-01-21 DIAGNOSIS — Z510 Encounter for antineoplastic radiation therapy: Secondary | ICD-10-CM | POA: Diagnosis not present

## 2023-01-21 LAB — RAD ONC ARIA SESSION SUMMARY
Course Elapsed Days: 2
Plan Fractions Treated to Date: 2
Plan Prescribed Dose Per Fraction: 1.8 Gy
Plan Total Fractions Prescribed: 33
Plan Total Prescribed Dose: 59.4 Gy
Reference Point Dosage Given to Date: 3.6 Gy
Reference Point Session Dosage Given: 1.8 Gy
Session Number: 2

## 2023-01-21 NOTE — Telephone Encounter (Signed)
LM for patient that this nurse was calling to see how they were doing after their treatment. Please call back to Dr. Marney Doctor nurse at [phone] if they have any questions or concerns regarding the treatment.

## 2023-01-21 NOTE — Telephone Encounter (Signed)
Called to schedule port placement, no answer, no vm. AB

## 2023-01-21 NOTE — Telephone Encounter (Signed)
-----   Message from Charleston Poot, RN sent at 01/20/2023  1:03 PM EST ----- Regarding: first time/ paclitaxel and carboplatin/ Dr Julien Nordmann pt Hi,  Pt had first time paclitaxel and carboplatin today. Tolerated well.   Thanks!

## 2023-01-22 ENCOUNTER — Ambulatory Visit: Payer: 59

## 2023-01-22 ENCOUNTER — Other Ambulatory Visit: Payer: Self-pay

## 2023-01-22 ENCOUNTER — Ambulatory Visit
Admission: RE | Admit: 2023-01-22 | Discharge: 2023-01-22 | Disposition: A | Discharge status: Home / Self Care | Payer: 59 | Source: Ambulatory Visit | Attending: Radiation Oncology | Admitting: Radiation Oncology

## 2023-01-22 DIAGNOSIS — Z510 Encounter for antineoplastic radiation therapy: Secondary | ICD-10-CM | POA: Diagnosis not present

## 2023-01-22 LAB — RAD ONC ARIA SESSION SUMMARY
Course Elapsed Days: 3
Plan Fractions Treated to Date: 3
Plan Prescribed Dose Per Fraction: 1.8 Gy
Plan Total Fractions Prescribed: 33
Plan Total Prescribed Dose: 59.4 Gy
Reference Point Dosage Given to Date: 5.4 Gy
Reference Point Session Dosage Given: 1.8 Gy
Session Number: 3

## 2023-01-22 MED FILL — Dexamethasone Sodium Phosphate Inj 100 MG/10ML: INTRAMUSCULAR | Qty: 1 | Status: AC

## 2023-01-25 ENCOUNTER — Ambulatory Visit
Admission: RE | Admit: 2023-01-25 | Discharge: 2023-01-25 | Disposition: A | Discharge status: Home / Self Care | Payer: 59 | Source: Ambulatory Visit | Attending: Radiation Oncology | Admitting: Radiation Oncology

## 2023-01-25 ENCOUNTER — Inpatient Hospital Stay: Payer: 59

## 2023-01-25 ENCOUNTER — Other Ambulatory Visit: Payer: Self-pay

## 2023-01-25 DIAGNOSIS — Z510 Encounter for antineoplastic radiation therapy: Secondary | ICD-10-CM | POA: Diagnosis not present

## 2023-01-25 DIAGNOSIS — C3431 Malignant neoplasm of lower lobe, right bronchus or lung: Secondary | ICD-10-CM

## 2023-01-25 LAB — CMP (CANCER CENTER ONLY)
ALT: 9 U/L (ref 0–44)
AST: 13 U/L — ABNORMAL LOW (ref 15–41)
Albumin: 3.9 g/dL (ref 3.5–5.0)
Alkaline Phosphatase: 71 U/L (ref 38–126)
Anion gap: 7 (ref 5–15)
BUN: 9 mg/dL (ref 6–20)
CO2: 27 mmol/L (ref 22–32)
Calcium: 9.5 mg/dL (ref 8.9–10.3)
Chloride: 105 mmol/L (ref 98–111)
Creatinine: 0.84 mg/dL (ref 0.61–1.24)
GFR, Estimated: >60 mL/min (ref >60)
Glucose, Bld: 99 mg/dL (ref 70–99)
Potassium: 4.2 mmol/L (ref 3.5–5.1)
Sodium: 139 mmol/L (ref 135–145)
Total Bilirubin: 0.5 mg/dL (ref 0.3–1.2)
Total Protein: 7.3 g/dL (ref 6.5–8.1)

## 2023-01-25 LAB — CBC WITH DIFFERENTIAL (CANCER CENTER ONLY)
Abs Immature Granulocytes: 0.03 10*3/uL (ref 0.00–0.07)
Basophils Absolute: 0 10*3/uL (ref 0.0–0.1)
Basophils Relative: 0 %
Eosinophils Absolute: 0 10*3/uL (ref 0.0–0.5)
Eosinophils Relative: 0 %
HCT: 49.6 % (ref 39.0–52.0)
Hemoglobin: 16.6 g/dL (ref 13.0–17.0)
Immature Granulocytes: 0 %
Lymphocytes Relative: 10 %
Lymphs Abs: 0.7 10*3/uL (ref 0.7–4.0)
MCH: 32 pg (ref 26.0–34.0)
MCHC: 33.5 g/dL (ref 30.0–36.0)
MCV: 95.6 fL (ref 80.0–100.0)
Monocytes Absolute: 0.5 10*3/uL (ref 0.1–1.0)
Monocytes Relative: 7 %
Neutro Abs: 5.8 10*3/uL (ref 1.7–7.7)
Neutrophils Relative %: 83 %
Platelet Count: 251 10*3/uL (ref 150–400)
RBC: 5.19 MIL/uL (ref 4.22–5.81)
RDW: 14.5 % (ref 11.5–15.5)
WBC Count: 7 10*3/uL (ref 4.0–10.5)
nRBC: 0.3 % — ABNORMAL HIGH (ref 0.0–0.2)

## 2023-01-25 LAB — RAD ONC ARIA SESSION SUMMARY
Course Elapsed Days: 6
Plan Fractions Treated to Date: 4
Plan Prescribed Dose Per Fraction: 1.8 Gy
Plan Total Fractions Prescribed: 33
Plan Total Prescribed Dose: 59.4 Gy
Reference Point Dosage Given to Date: 7.2 Gy
Reference Point Session Dosage Given: 1.8 Gy
Session Number: 4

## 2023-01-25 NOTE — Progress Notes (Signed)
Established Patient Office Visit  Subjective   Patient ID: Richard [name], male    DOB: January 02, 1971  Age: 52 y.o. MRN: [id number]  No chief complaint on file.   HPI    ROS    Objective:     BP (!) 144/110 (BP Location: Right Arm, Patient Position: Sitting)   Pulse (!) 110   Temp 98 F (36.7 C) (Oral)   Resp (!) 22   Wt 201 lb 8 oz (91.4 kg)   SpO2 96%   BMI 29.76 kg/m    Physical Exam   Results for orders placed or performed in visit on 01/25/23  CMP ([facility] only)  Result Value Ref Range   Sodium 139 135 - 145 mmol/L   Potassium 4.2 3.5 - 5.1 mmol/L   Chloride 105 98 - 111 mmol/L   CO2 27 22 - 32 mmol/L   Glucose, Bld 99 70 - 99 mg/dL   BUN 9 6 - 20 mg/dL   Creatinine 0.84 0.61 - 1.24 mg/dL   Calcium 9.5 8.9 - 10.3 mg/dL   Total Protein 7.3 6.5 - 8.1 g/dL   Albumin 3.9 3.5 - 5.0 g/dL   AST 13 (L) 15 - 41 U/L   ALT 9 0 - 44 U/L   Alkaline Phosphatase 71 38 - 126 U/L   Total Bilirubin 0.5 0.3 - 1.2 mg/dL   GFR, Estimated >60 >60 mL/min   Anion gap 7 5 - 15  CBC with Differential ([facility] Only)  Result Value Ref Range   WBC Count 7.0 4.0 - 10.5 K/uL   RBC 5.19 4.22 - 5.81 MIL/uL   Hemoglobin 16.6 13.0 - 17.0 g/dL   HCT 49.6 39.0 - 52.0 %   MCV 95.6 80.0 - 100.0 fL   MCH 32.0 26.0 - 34.0 pg   MCHC 33.5 30.0 - 36.0 g/dL   RDW 14.5 11.5 - 15.5 %   Platelet Count 251 150 - 400 K/uL   nRBC 0.3 (H) 0.0 - 0.2 %   Neutrophils Relative % 83 %   Neutro Abs 5.8 1.7 - 7.7 K/uL   Lymphocytes Relative 10 %   Lymphs Abs 0.7 0.7 - 4.0 K/uL   Monocytes Relative 7 %   Monocytes Absolute 0.5 0.1 - 1.0 K/uL   Eosinophils Relative 0 %   Eosinophils Absolute 0.0 0.0 - 0.5 K/uL   Basophils Relative 0 %   Basophils Absolute 0.0 0.0 - 0.1 K/uL   Immature Granulocytes 0 %   Abs Immature Granulocytes 0.03 0.00 - 0.07 K/uL  Results for orders placed or performed in visit on 01/25/23  Rad Onc [facility] Session Summary  Result Value Ref Range   Course ID C2_Chest     Course Intent Unknown    Course Start Date 01/13/2023 11:02 AM    Session Number 4    Course First Treatment Date 01/19/2023 10:54 AM    Course Last Treatment Date 01/25/2023 11:31 AM    Course Elapsed Days 6    Reference Point ID Lung_R_ReTx DP    Reference Point Dosage Given to Date 7.20000004 Gy   Reference Point Session Dosage Given 1.80000001 Gy   Plan ID Lung_R_ReTx    Plan Name Lung_R_ReTx    Plan Fractions Treated to Date 4    Plan Total Fractions Prescribed 33    Plan Prescribed Dose Per Fraction 1.8 Gy   Plan Total Prescribed Dose 59.400000 Gy   Plan Primary Reference Point Lung_R_ReTx DP  The ASCVD Risk score (Arnett DK, et al., 2019) failed to calculate for the following reasons:   The valid total cholesterol range is 130 to 320 mg/dL    Assessment & Plan:   Problem List Items Addressed This Visit       Respiratory   Malignant neoplasm of right lung ([place])    No follow-ups on file.    Bonnee Quin, RN 2 sat on room air 81% oxygen applied O2 sta 95% 4 liters.

## 2023-01-25 NOTE — Progress Notes (Signed)
Pt. arrived to treatment area stating he did not want to receive treatment today. Blood pressure and heart rate elevated. States he has right side, ache/chest pain rates pain 8/10. Oxygen saturation 81% on room air and pt. has portable oxygen, but it is not on. Oxygen 4 liters per nasal cannula applied and saturation 95%. Pt. states he has had some headaches, weakness, denies falls. Left lower leg with edema noted. Also had some constipation, but able to move his bowels today. Pt. settled in and was eating some lunch. Discussed pt. situation with Anda Kraft, [place] and Cassie PA. Provider did not come for assessment and instructed RN to send to emergency department. Educated pt. and instructed pt. to go straight to the emergency department. RN wanted to take in wheelchair, but pt. declined. Stated that he and his family wanted to bring the car over there. I asked if I could take him and family member bring car, but pt. declined. Pt. left via wheelchair with family member. Portable oxygen on and no respiratory distress noted.

## 2023-01-26 ENCOUNTER — Ambulatory Visit
Admission: RE | Admit: 2023-01-26 | Discharge: 2023-01-26 | Disposition: A | Discharge status: Home / Self Care | Payer: 59 | Source: Ambulatory Visit | Attending: Radiation Oncology | Admitting: Radiation Oncology

## 2023-01-26 ENCOUNTER — Other Ambulatory Visit: Payer: Self-pay

## 2023-01-26 ENCOUNTER — Telehealth: Payer: Self-pay | Admitting: Emergency Medicine

## 2023-01-26 DIAGNOSIS — Z510 Encounter for antineoplastic radiation therapy: Secondary | ICD-10-CM | POA: Diagnosis not present

## 2023-01-26 LAB — RAD ONC ARIA SESSION SUMMARY
Course Elapsed Days: 7
Plan Fractions Treated to Date: 5
Plan Prescribed Dose Per Fraction: 1.8 Gy
Plan Total Fractions Prescribed: 33
Plan Total Prescribed Dose: 59.4 Gy
Reference Point Dosage Given to Date: 9 Gy
Reference Point Session Dosage Given: 1.8 Gy
Session Number: 5

## 2023-01-26 NOTE — Telephone Encounter (Signed)
Today, this nurse received signed FMLA form for Richard [name]'s spouse Richard [name].  Successfully returned to Richard [name] via fax.  E-mailed to claims benefit manager noted on Richard [name].   Envelope to [facility] entry receptionist locked file.   Connected with Richard [name] [phone]).  Advised of above information and correct form not received thus this nurse obtained copy of "FMLA for Care of Family Member" for use.  Copy ready for pick up tomorrow. Currently no questions or needs.   Copy to bin for items to be scanned completes process.  No further instructions received or actions performed by this nurse.

## 2023-01-27 ENCOUNTER — Other Ambulatory Visit: Payer: Self-pay

## 2023-01-27 ENCOUNTER — Ambulatory Visit
Admission: RE | Admit: 2023-01-27 | Discharge: 2023-01-27 | Disposition: A | Discharge status: Home / Self Care | Payer: 59 | Source: Ambulatory Visit | Attending: Radiation Oncology | Admitting: Radiation Oncology

## 2023-01-27 ENCOUNTER — Encounter (HOSPITAL_COMMUNITY): Payer: Self-pay

## 2023-01-27 ENCOUNTER — Emergency Department (HOSPITAL_COMMUNITY)
Admission: EM | Admit: 2023-01-27 | Discharge: 2023-01-28 | Disposition: A | Discharge status: Home / Self Care | Payer: 59 | Attending: Emergency Medicine | Admitting: Emergency Medicine

## 2023-01-27 DIAGNOSIS — Z85118 Personal history of other malignant neoplasm of bronchus and lung: Secondary | ICD-10-CM | POA: Diagnosis not present

## 2023-01-27 DIAGNOSIS — R079 Chest pain, unspecified: Secondary | ICD-10-CM | POA: Insufficient documentation

## 2023-01-27 DIAGNOSIS — I10 Essential (primary) hypertension: Secondary | ICD-10-CM | POA: Insufficient documentation

## 2023-01-27 DIAGNOSIS — Z79899 Other long term (current) drug therapy: Secondary | ICD-10-CM | POA: Diagnosis not present

## 2023-01-27 LAB — RAD ONC ARIA SESSION SUMMARY
Course Elapsed Days: 8
Plan Fractions Treated to Date: 6
Plan Prescribed Dose Per Fraction: 1.8 Gy
Plan Total Fractions Prescribed: 33
Plan Total Prescribed Dose: 59.4 Gy
Reference Point Dosage Given to Date: 10.8 Gy
Reference Point Session Dosage Given: 1.8 Gy
Session Number: 6

## 2023-01-27 NOTE — ED Triage Notes (Signed)
Arrives EMS from home with sudden right sided chest pains that woke him from sleep. Denies any pain radiation. Pain began ~20 minutes prior to hospital arrival. 4L [place] all the time but has not been wearing it at home. Pain relieved after oxygen administration.   Upon arrival pt refusing IV, cbg, and does not want to be seen because of the chair in the room.

## 2023-01-27 NOTE — ED Notes (Signed)
Refusing home oxygen, blood work, XR. Encouraged to evaluate chest pains.

## 2023-01-28 ENCOUNTER — Other Ambulatory Visit: Payer: Self-pay

## 2023-01-28 ENCOUNTER — Ambulatory Visit
Admission: RE | Admit: 2023-01-28 | Discharge: 2023-01-28 | Disposition: A | Discharge status: Home / Self Care | Payer: 59 | Source: Ambulatory Visit | Attending: Radiation Oncology | Admitting: Radiation Oncology

## 2023-01-28 DIAGNOSIS — Z510 Encounter for antineoplastic radiation therapy: Secondary | ICD-10-CM | POA: Diagnosis not present

## 2023-01-28 LAB — RAD ONC ARIA SESSION SUMMARY
Course Elapsed Days: 9
Plan Fractions Treated to Date: 7
Plan Prescribed Dose Per Fraction: 1.8 Gy
Plan Total Fractions Prescribed: 33
Plan Total Prescribed Dose: 59.4 Gy
Reference Point Dosage Given to Date: 12.6 Gy
Reference Point Session Dosage Given: 1.8 Gy
Session Number: 7

## 2023-01-28 NOTE — Discharge Instructions (Signed)
You are seen in the ER today for chest pain.  You declined all workup today besides your EKG.  Your EKG did not show signs of heart attack, however per our discussion your workup was incomplete therefore an emergent underlying cause for your symptoms cannot be ruled out.  Please follow-up with your oncologist today as previously scheduled and return to the ER if you develop any recurrent chest pain, shortness of breath increased from baseline, or any other new severe symptoms.

## 2023-01-28 NOTE — ED Provider Notes (Signed)
[facility] Provider Note   CSN: [id number] Arrival date & time: 01/27/23  2328     History  Chief Complaint  Patient presents with   Chest Pain    Richard [name] is a 52 y.o. male with history of non-small cell lung cancer currently undergoing chemotherapy and radiation treatment, diagnosed in January 2024 who presents with concern for right-sided chest wall pain that started spontaneously when he is home resting, awoken from sleep, and lasted a total of less than 45 minutes.  Pain improved after he resumed baseline oxygen therapy 4 L nasal cannula at all times; had not had his oxygen on when the pain initially began.  Pain was sharp in nature, did not radiate, no associated palpitations or shortness of breath increased from baseline.  Upon arrival to the emergency department patient refused all laboratory studies, chest x-ray, and EKG in triage.  Time of my evaluation patient is resting comfortably in his hospital bed on his baseline 4 L supplemental oxygen by nasal cannula, chest pain-free, requesting to go home.  Patient states that he has chemotherapy scheduled for today.  In addition to the above listed history patient has history of hypertension, seizure disorder, alcohol use, lower extremity DVT not currently on any anticoagulation, and COPD.  Of note patient was Recently evaluated on CT venogram without evidence of thrombosis in the abdomen pelvis or bilateral lower extremities, evaluation initiated due to persistent left lower extremity swelling with negative Doppler.  HPI     Home Medications Prior to Admission medications   Medication Sig Start Date End Date Taking? Authorizing Provider  albuterol (VENTOLIN HFA) 108 (90 Base) MCG/ACT inhaler Inhale 1-2 puffs into the lungs every 6 (six) hours as needed for wheezing or shortness of breath. 06/03/22   Parrett, Fonnie Mu, NP  [name] 160-4.5 MCG/ACT inhaler Inhale 2 puffs into the lungs  daily. 12/22/22   [provider]  cloNIDine (CATAPRES) 0.3 MG tablet Take 1 tablet (0.3 mg total) by mouth 2 (two) times daily. Patient not taking: Reported on 01/15/2023 02/11/22   Florencia Reasons, MD  diclofenac Sodium (VOLTAREN) 1 % GEL Apply 4 g topically 4 (four) times daily. Patient not taking: Reported on 01/15/2023 01/12/23   [name], MD  levETIRAcetam (KEPPRA) 500 MG tablet Take 1 tablet (500 mg total) by mouth 2 (two) times daily. 01/17/23   [name], MD  losartan (COZAAR) 100 MG tablet Take 100 mg by mouth daily. 12/31/22   [provider]  OXYGEN Inhale 2 L/min into the lungs daily as needed (for shortness of breath).    [provider]  prochlorperazine (COMPAZINE) 10 MG tablet Take 1 tablet (10 mg total) by mouth every 6 (six) hours as needed for nausea or vomiting. 01/20/23   Curt Bears, MD      Allergies    Dilaudid [hydromorphone hcl] and Tape    Review of Systems   Review of Systems  Constitutional: Negative.   HENT: Negative.    Respiratory: Negative.    Cardiovascular:  Positive for chest pain. Negative for palpitations and leg swelling.  Gastrointestinal: Negative.   Neurological: Negative.     Physical Exam Updated Vital Signs BP (!) 146/108 (BP Location: Right Arm)   Pulse 100   Temp 97.9 F (36.6 C) (Oral)   Resp (!) 22   Ht 5\' 9"  (1.753 m)   Wt 91.2 kg   SpO2 95%   BMI 29.68 kg/m  Physical Exam Vitals  and nursing note reviewed.  Constitutional:      Appearance: He is not ill-appearing or toxic-appearing.  HENT:     Head: Normocephalic and atraumatic.     Mouth/Throat:     Mouth: Mucous membranes are moist.     Pharynx: No oropharyngeal exudate or posterior oropharyngeal erythema.  Eyes:     General:        Right eye: No discharge.        Left eye: No discharge.     Conjunctiva/sclera: Conjunctivae normal.  Cardiovascular:     Rate and Rhythm: Normal rate and regular rhythm.     Pulses: Normal pulses.      Heart sounds: Normal heart sounds. No murmur heard. Pulmonary:     Effort: Pulmonary effort is normal. No respiratory distress.     Breath sounds: Examination of the right-lower field reveals decreased breath sounds. Examination of the left-lower field reveals decreased breath sounds. Decreased breath sounds present. No wheezing or rales.     Comments: On 4L O2 by [place], baseline supplementation for this patient.  Chest:     Chest wall: No mass, lacerations, deformity, swelling, tenderness, crepitus or edema.     Comments: No skin changes, signs of trauma, or TTP over the right lateral chest wall at patient's indicated site of pain.  Abdominal:     General: Bowel sounds are normal. There is no distension.     Tenderness: There is no abdominal tenderness.  Musculoskeletal:        General: No deformity.     Cervical back: Neck supple.  Skin:    General: Skin is warm and dry.     Findings: No rash.     Comments: Clubbing of the finger nails  Neurological:     Mental Status: He is alert. Mental status is at baseline.  Psychiatric:        Mood and Affect: Mood normal.     ED Results / Procedures / Treatments   Labs (all labs ordered are listed, but only abnormal results are displayed) Labs Reviewed - No data to display  EKG None  Radiology No results found.  Procedures Procedures    Medications Ordered in ED Medications - No data to display  ED Course/ Medical Decision Making/ A&P                             Medical Decision Making 52 year old male who presents with concern for CP, now resolved.   HTN on intake, VS otherwise normal, cardiopulmonary exam as above.  Clubbing of the finger nails. GCS of 15, well appearing at this time.   DDX includes but is not limited to ACS, PE, pleural effusion, dysrhythmia, malignancy related pain, MSK pain, shingles.   Amount and/or Complexity of Data Reviewed ECG/medicine tests:     Details: EKG with sinus rhythm, no STEMI, LVH. No  dysrhythmia.    Patient refused laboratory studies and chest xray. Recent CTA and venograms negative for acute thrombosis. Doubt PE, doubt ACS clinically at this time, however extensive discussion with the patient regarding incomplete nature of his workup without laboratory studies. Unable to completely rule out emergent etiology that would warrant further ED workup and possible inpatient management without further workup.   Leeon  voiced understanding of his medical evaluation and the limited nature of the insight provided without further workup. He remains CP free at this time, on baseline supplemental O2. Requesting discharge.  Strict return precautions give.. Each of their questions answered to their expressed satisfaction.  Return precautions were given.  Patient is well-appearing, stable, and was discharged in good condition.  This chart was dictated using voice recognition software, Dragon. Despite the best efforts of this provider to proofread and correct errors, errors may still occur which can change documentation meaning.          Final Clinical Impression(s) / ED Diagnoses Final diagnoses:  Chest pain, unspecified type    Rx / DC Orders ED Discharge Orders     None         Aura Dials 01/28/23 0458    Fatima Blank, MD 01/28/23 1906

## 2023-01-29 ENCOUNTER — Other Ambulatory Visit: Payer: Self-pay

## 2023-01-29 ENCOUNTER — Ambulatory Visit
Admission: RE | Admit: 2023-01-29 | Discharge: 2023-01-29 | Disposition: A | Discharge status: Home / Self Care | Payer: 59 | Source: Ambulatory Visit | Attending: Radiation Oncology | Admitting: Radiation Oncology

## 2023-01-29 DIAGNOSIS — Z510 Encounter for antineoplastic radiation therapy: Secondary | ICD-10-CM | POA: Diagnosis not present

## 2023-01-29 DIAGNOSIS — C3431 Malignant neoplasm of lower lobe, right bronchus or lung: Secondary | ICD-10-CM

## 2023-01-29 LAB — RAD ONC ARIA SESSION SUMMARY
Course Elapsed Days: 10
Plan Fractions Treated to Date: 8
Plan Prescribed Dose Per Fraction: 1.8 Gy
Plan Total Fractions Prescribed: 33
Plan Total Prescribed Dose: 59.4 Gy
Reference Point Dosage Given to Date: 14.4 Gy
Reference Point Session Dosage Given: 1.8 Gy
Session Number: 8

## 2023-01-30 NOTE — Telephone Encounter (Signed)
Looks like no one has looked at this encounter did anyone call the wife to see how we could help her

## 2023-01-31 NOTE — Progress Notes (Unsigned)
[facility] OFF[facility] PROGRESS NOTE  Medicine, [facility] [address] [place] [address]  DIAGNOSIS:  Stage IIIb (T4, N2, M0) non-small cell lung cancer, poorly differentiated carcinoma with neuroendocrine features diagnosed in January 2024 and presented with large right lower lobe lung mass in addition to right hilar and mediastinal lymphadenopathy.  PRIOR THERAPY: Status post short course of palliative radiotherapy with 30 Gray completed in March 2023.   CURRENT THERAPY: A course of concurrent chemoradiation with weekly carboplatin for AUC of 2 and paclitaxel 45 Mg/M2. Status post 1 cycle  INTERVAL HISTORY: Richard [name] 52 y.o. male returns to the clinic today for a follow-up visit. The patient recently started treatment with concurrent chemoradiation although he has missed a few appointments for chemotherapy due to not feeling up to getting treatment. He does not feel up to getting treatment today. He states he was mentally prepared to not get treatment today and he won't have a ride later in the day home. He is wondering if he can get this Thursday of this week instead. He is scheduled for his next treatment next week Tuesday 02/09/23. He was supposed to undergo his first cycle of treatment on 2/6 but did not get treatment that day and received cycle #1 on 2/7 instead.  Last week, he was here for cycle #2 on 2/12 but was reporting right-sided chest discomfort earlier that day, tachycardia, and uncontrolled hypertension.  He was subsequently seen in the emergency room where he refused workup with EKG, lab work, and chest x-ray.  His chest pain had subsided and she was discharged home.  The patient is worried that the chemotherapy made his leg swell and cause his tachycardia.  Although the patient was in the emergency room prior to starting any chemotherapy on 01/16/2023 for the chief complaint of leg swelling for which she underwent Doppler ultrasound which was  negative for DVT.  The patient's ejection fraction is 60 to 65%.  The patient does not have any leg swelling at this time.  Overall, he tolerated treatment otherwise alright. Today he denies any fever, chills, night sweats, or unexplained weight loss.  Denies chest pain at this time. Denies significant cough or hemoptysis, except sometimes he may have a cough at night time. He is on 4 L of supplemental oxygen. Overall, he does think his shortness of breath has improved compared to when he was first diagnosed. Denies any nausea, vomiting, or diarrhea, or constipation.  Denies any headache or visual changes. He is here today for evaluation repeat blood work before undergoing cycle #2.   MEDICAL HISTORY: Past Medical History:  Diagnosis Date   Asthma    Brain bleed ([place])    DVT (deep venous thrombosis) ([place]) 02/19/2015   RLE   GERD (gastroesophageal reflux disease)    History of home oxygen therapy    Hypertension    Seizures ([place])    last episode 03/2013   Stroke [facility])     ALLERGIES:  is allergic to dilaudid [hydromorphone hcl] and tape.  MEDICATIONS:  Current Outpatient Medications  Medication Sig Dispense Refill   albuterol (VENTOLIN HFA) 108 (90 Base) MCG/ACT inhaler Inhale 1-2 puffs into the lungs every 6 (six) hours as needed for wheezing or shortness of breath. 8 g 3   [name] 160-4.5 MCG/ACT inhaler Inhale 2 puffs into the lungs daily.     cloNIDine (CATAPRES) 0.3 MG tablet Take 1 tablet (0.3 mg total) by mouth 2 (two) times daily. (Patient not  taking: Reported on 01/15/2023) 60 tablet 0   diclofenac Sodium (VOLTAREN) 1 % GEL Apply 4 g topically 4 (four) times daily. (Patient not taking: Reported on 01/15/2023)     levETIRAcetam (KEPPRA) 500 MG tablet Take 1 tablet (500 mg total) by mouth 2 (two) times daily.     losartan (COZAAR) 100 MG tablet Take 100 mg by mouth daily.     OXYGEN Inhale 2 L/min into the lungs daily as needed (for shortness of breath).     prochlorperazine  (COMPAZINE) 10 MG tablet Take 1 tablet (10 mg total) by mouth every 6 (six) hours as needed for nausea or vomiting. 30 tablet 0   No current facility-administered medications for this visit.    SURGICAL HISTORY:  Past Surgical History:  Procedure Laterality Date   BRONCHIAL BIOPSY  02/10/2022   Procedure: BRONCHIAL BIOPSIES;  Surgeon: Rigoberto Noel, MD;  Location: [facility];  Service: Cardiopulmonary;;   BRONCHIAL BRUSHINGS  02/10/2022   Procedure: BRONCHIAL BRUSHINGS;  Surgeon: Rigoberto Noel, MD;  Location: [facility];  Service: Cardiopulmonary;;   BRONCHIAL NEEDLE ASPIRATION BIOPSY  02/10/2022   Procedure: BRONCHIAL NEEDLE ASPIRATION BIOPSIES;  Surgeon: Rigoberto Noel, MD;  Location: [facility];  Service: Cardiopulmonary;;   BRONCHIAL WASHINGS  02/10/2022   Procedure: BRONCHIAL WASHINGS;  Surgeon: Rigoberto Noel, MD;  Location: [facility];  Service: Cardiopulmonary;;   ENDOBRONCHIAL ULTRASOUND Bilateral 02/10/2022   Procedure: ENDOBRONCHIAL ULTRASOUND;  Surgeon: Rigoberto Noel, MD;  Location: [facility];  Service: Cardiopulmonary;  Laterality: Bilateral;   LEG SURGERY     VIDEO BRONCHOSCOPY  02/10/2022   Procedure: VIDEO BRONCHOSCOPY WITHOUT FLUORO;  Surgeon: Rigoberto Noel, MD;  Location: [facility];  Service: Cardiopulmonary;;    REVIEW OF SYSTEMS:   Review of Systems  Constitutional: Negative for appetite change, chills, fatigue, fever and unexpected weight change.  HENT: Negative for mouth sores, nosebleeds, sore throat and trouble swallowing.   Eyes: Negative for eye problems and icterus.  Respiratory: positive for cough sometimes at night. Positive for improving dyspnea on exertion. Negative for hemoptysis and wheezing.   Cardiovascular: Negative for chest pain and leg swelling.  Gastrointestinal: Negative for abdominal pain, constipation, diarrhea, nausea and vomiting.  Genitourinary: Negative for bladder incontinence, difficulty urinating, dysuria, frequency and  hematuria.   Musculoskeletal: Negative for back pain, gait problem, neck pain and neck stiffness.  Skin: Negative for itching and rash.  Neurological: Negative for dizziness, extremity weakness, gait problem, headaches, light-headedness and seizures.  Hematological: Negative for adenopathy. Does not bruise/bleed easily.  Psychiatric/Behavioral: Negative for confusion, depression and sleep disturbance. The patient is not nervous/anxious.     PHYSICAL EXAMINATION:  Blood pressure (!) 167/98, pulse 99, temperature 97.8 F (36.6 C), temperature source Oral, resp. rate 17, weight 198 lb 3.2 oz (89.9 kg), SpO2 94 %.  ECOG PERFORMANCE STATUS: 1  Physical Exam  Constitutional: Oriented to person, place, and time and well-developed, well-nourished, and in no distress.  HENT:  Head: Normocephalic and atraumatic.  Mouth/Throat: Oropharynx is clear and moist. No oropharyngeal exudate.  Eyes: Conjunctivae are normal. Right eye exhibits no discharge. Left eye exhibits no discharge. No scleral icterus.  Neck: Normal range of motion. Neck supple.  Cardiovascular: Normal rate, regular rhythm, normal heart sounds and intact distal pulses.   Pulmonary/Chest: Effort normal. Quiet breath sounds bilaterally. On supplemental oxygen via nasal cannula. No respiratory distress. No wheezes. No rales.  Abdominal: Soft. Bowel sounds are normal. Exhibits no distension and no mass. There is  no tenderness.  Musculoskeletal: Normal range of motion. Exhibits no edema.  Lymphadenopathy:    No cervical adenopathy.  Neurological: Alert and oriented to person, place, and time. Exhibits normal muscle tone. Gait normal. Coordination normal.  Skin: Skin is warm and dry. No rash noted. Not diaphoretic. No erythema. No pallor.  Psychiatric: Mood, memory and judgment normal.  Vitals reviewed.  LABORATORY DATA: Lab Results  Component Value Date   WBC 4.6 02/02/2023   HGB 16.1 02/02/2023   HCT 46.7 02/02/2023   MCV 95.7  02/02/2023   PLT 284 02/02/2023      Chemistry      Component Value Date/Time   NA 140 02/02/2023 1005   K 4.4 02/02/2023 1005   CL 108 02/02/2023 1005   CO2 27 02/02/2023 1005   BUN 13 02/02/2023 1005   CREATININE 0.90 02/02/2023 1005   CREATININE 0.91 12/05/2013 1605      Component Value Date/Time   CALCIUM 9.2 02/02/2023 1005   ALKPHOS 75 02/02/2023 1005   AST 11 (L) 02/02/2023 1005   ALT 6 02/02/2023 1005   BILITOT 0.4 02/02/2023 1005       RADIOGRAPHIC STUDIES:  CT VENOGRAM ABD/PELVIS/LOWER EXT BILAT  Result Date: 01/17/2023 CLINICAL DATA:  Right leg swelling.  May-Thurner suspected. EXAM: CT VENOGRAM ABDOMEN AND PELVIS AND LOWER EXTREMITY BILATERAL TECHNIQUE: RADIATION DOSE REDUCTION: This exam was performed according to the departmental dose-optimization program which includes automated exposure control, adjustment of the mA and/or kV according to patient size and/or use of iterative reconstruction technique. CONTRAST:  127mL OMNIPAQUE IOHEXOL 350 MG/ML SOLN COMPARISON:  CTA chest, abdomen and pelvis 10/12/2021. FINDINGS: Lower chest: No pleural fluid or airspace disease. Hepatobiliary: No focal liver abnormality is seen. No gallstones, gallbladder wall thickening, or biliary dilatation. Pancreas: Unremarkable. No pancreatic ductal dilatation or surrounding inflammatory changes. Spleen: Normal in size without focal abnormality. Adrenals/Urinary Tract: Normal right adrenal gland. Unchanged low-attenuation enlargement of the left adrenal gland which likely represents adenomatous hyperplasia. No follow-up imaging recommended. Simple appearing cyst arises off the inferior pole of the right kidney measuring 1.9 cm, image 34/6. No follow-up imaging recommended. Too small to characterize low-density structures noted within the lower pole of the left kidney measuring 7 mm, image 34/6. no hydronephrosis identified bilaterally. Urinary bladder is unremarkable. Stomach/Bowel: Stomach appears  normal. The appendix is visualized and is within normal limits. No bowel wall thickening, inflammation, or distension. Vascular/Lymphatic: Aortic atherosclerosis. No aneurysm. No signs of abdominopelvic adenopathy. Reproductive: Prostate is unremarkable. Large left and moderate right hydroceles. Other: No free fluid or fluid collections. Musculoskeletal: Sclerotic lesion in the right iliac bone is stable compared with the previous exam and most likely represents a benign bone. Spondylosis identified within the lumbar spine. No acute or suspicious osseous findings. IVC: No evidence for thrombus or stenosis. Portal and mesenteric veins: No evidence for thrombus or stenosis. Bilateral iliac veins: No evidence for thrombus or stenosis. Left common iliac vein has a normal caliber and is widely patent. Right lower extremity: No evidence for thrombus involving the common femoral, femoral, popliteal and visualized deep calf veins Left lower extremity: No evidence for thrombus involving the common femoral, femoral, popliteal and visualized deep calf veins. Small left joint effusion is identified most notable along the lateral recess of the left knee. There is mild asymmetric soft tissue edema involving mid and distal left thigh extending to below the knee. Mild overlying skin thickening is noted. IMPRESSION: 1. No evidence for venous thrombosis within the abdomen,  pelvis or bilateral lower extremities. 2. No signs of May-Thurner syndrome. 3. Mild asymmetric soft tissue edema involving the mid and distal left thigh extending to below the knee. Mild overlying skin thickening is noted. Correlate for any clinical signs or symptoms of cellulitis. 4. Small left knee joint effusion. 5. Bilateral hydroceles noted within the scrotum, left greater than right. 6.  Aortic Atherosclerosis (ICD10-I70.0). Electronically Signed   By: Kerby Moors M.D.   On: 01/17/2023 12:49   MR BRAIN WO CONTRAST  Result Date: 01/16/2023 CLINICAL DATA:   Acute stroke suspected EXAM: MRI HEAD WITHOUT CONTRAST TECHNIQUE: Multiplanar, multiecho pulse sequences of the brain and surrounding structures were obtained without intravenous contrast. COMPARISON:  Brain MRI 12/26/2022 FINDINGS: Brain: No acute infarction, hemorrhage, hydrocephalus, extra-axial collection or mass lesion. Chronic, extensive left frontal encephalomalacia. Confluent chronic small vessel ischemia in the cerebral white matter and pons. Mild cerebral volume loss. Vascular: Normal flow voids. Skull and upper cervical spine: Normal marrow signal. Sinuses/Orbits: Chronic sinusitis with near complete opacification of the atelectatic left maxillary sinus. New opacification the right maxillary sinus. IMPRESSION: 1. No acute infarct. 2. Stable chronic small vessel ischemia and left frontal encephalomalacia. 3. Right maxillary sinusitis since 12/26/2022. Chronic left maxillary sinusitis. Electronically Signed   By: Jorje Guild M.D.   On: 01/16/2023 11:21   VAS [place] LOWER EXTREMITY VENOUS (DVT)  Result Date: 01/16/2023  Lower Venous DVT Study Patient Name:  JAHLEEL STROSCHEIN  Date of Exam:   01/16/2023 Medical Rec #: [id number]     Accession #:    [id number] Date of Birth: [birth year]     Patient Gender: M Patient Age:   5 years Exam Location:  [facility] Procedure:      VAS [place] LOWER EXTREMITY VENOUS (DVT) Referring Phys: Lalla Brothers --------------------------------------------------------------------------------  Indications: Swelling.  Limitations: Significant pitting edema in the calf. Comparison Study: Prior negative left LEV done 12/26/22 Performing Technologist: Sharion Dove RVS  Examination Guidelines: A complete evaluation includes B-mode imaging, spectral Doppler, color Doppler, and power Doppler as needed of all accessible portions of each vessel. Bilateral testing is considered an integral part of a complete examination. Limited examinations for reoccurring indications may be performed as  noted. The reflux portion of the exam is performed with the patient in reverse Trendelenburg.  +-----+---------------+---------+-----------+----------+--------------+ RIGHTCompressibilityPhasicitySpontaneityPropertiesThrombus Aging +-----+---------------+---------+-----------+----------+--------------+ CFV  Full           Yes      Yes                                 +-----+---------------+---------+-----------+----------+--------------+   +---------+---------------+---------+-----------+----------+-------------------+ LEFT     CompressibilityPhasicitySpontaneityPropertiesThrombus Aging      +---------+---------------+---------+-----------+----------+-------------------+ CFV      Full           Yes      Yes                                      +---------+---------------+---------+-----------+----------+-------------------+ SFJ      Full                                                             +---------+---------------+---------+-----------+----------+-------------------+ FV Prox  Full                                                             +---------+---------------+---------+-----------+----------+-------------------+ FV Mid   Full                                                             +---------+---------------+---------+-----------+----------+-------------------+ FV DistalFull                                                             +---------+---------------+---------+-----------+----------+-------------------+ PFV      Full                                                             +---------+---------------+---------+-----------+----------+-------------------+ POP      Full           Yes      Yes                                      +---------+---------------+---------+-----------+----------+-------------------+ PTV      Full                                                              +---------+---------------+---------+-----------+----------+-------------------+ PERO                                                  Not well visualized +---------+---------------+---------+-----------+----------+-------------------+    Summary: RIGHT: - No evidence of common femoral vein obstruction. Normal common femoral vein phasicity. Low suspicion for more proximal obstruction  LEFT: - Findings appear essentially unchanged compared to previous examination. - There is no evidence of deep vein thrombosis in the lower extremity.  - No cystic structure found in the popliteal fossa. Normal common femoral vein phasicity. Low suspicion for more proximal obstruction.  *See table(s) above for measurements and observations. Electronically signed by Orlie Pollen on 01/16/2023 at 10:47:12 AM.    Final    DG Chest 1 View  Result Date: 01/15/2023 CLINICAL DATA:  Short of breath, history of non-small cell lung cancer EXAM: CHEST  1 VIEW COMPARISON:  01/14/2023, 01/15/2023 FINDINGS: Single frontal view of the chest demonstrates a stable cardiac silhouette. Stable right hilar mass. Stable bilateral areas of scarring without airspace disease, effusion, or pneumothorax. No acute bony abnormality. IMPRESSION: 1.  Stable right hilar mass. 2. Stable bilateral scarring, no acute airspace disease. Electronically Signed   By: Randa Ngo M.D.   On: 01/15/2023 21:44   CT Angio Chest Pulmonary Embolism (PE) W or WO Contrast  Result Date: 01/15/2023 CLINICAL DATA:  Right leg swelling and redness. Increasing cough and shortness of breath. Non-small-cell lung cancer. * Tracking Code: BO * EXAM: CT ANGIOGRAPHY CHEST WITH CONTRAST TECHNIQUE: Multidetector CT imaging of the chest was performed using the standard protocol during bolus administration of intravenous contrast. Multiplanar CT image reconstructions and MIPs were obtained to evaluate the vascular anatomy. RADIATION DOSE REDUCTION: This exam was performed according to  the departmental dose-optimization program which includes automated exposure control, adjustment of the mA and/or kV according to patient size and/or use of iterative reconstruction technique. CONTRAST:  55mL OMNIPAQUE IOHEXOL 350 MG/ML SOLN COMPARISON:  12/25/2022 FINDINGS: Cardiovascular: Heart size upper normal to mildly increased. No pericardial effusion. Ascending thoracic aorta measures 4.0 cm diameter. Enlargement of the pulmonary outflow tract/main pulmonary arteries suggests pulmonary arterial hypertension. There is no filling defect within the opacified pulmonary arteries to suggest the presence of an acute pulmonary embolus. Mediastinum/Nodes: Similar mediastinal lymphadenopathy including 12 mm short axis AP window lymph node and 9 mm short axis low right paratracheal node. Similar appearance of the retro hilar right lung mass measuring 7.7 x 4.2 cm today compared to 7.9 x 4.1 cm previously. This lesion generates mass-effect on the right mainstem bronchus and bronchus intermedius. Lungs/Pleura: Centrilobular and paraseptal emphysema evident. Retro hilar posterior right lung mass, as above. There is bronchial wall thickening and the lower lungs bilaterally with areas of peripheral small airway impaction in the right lower lobe. No suspicious pulmonary nodule or mass in the left lung. No pleural effusion. Upper Abdomen: Stable left adrenal nodule previously characterized as benign adenoma. No followup imaging is recommended. Musculoskeletal: No worrisome lytic or sclerotic osseous abnormality. Review of the MIP images confirms the above findings. IMPRESSION: 1. No CT evidence for acute pulmonary embolus. 2. Enlargement of the pulmonary outflow tract/main pulmonary arteries suggests pulmonary arterial hypertension. 3. Ascending thoracic aorta measuring up to 4.0 cm diameter on today's study. Recommend annual imaging followup by CTA or MRA. This recommendation follows 2010  ACCF/AHA/AATS/ACR/ASA/SCA/SCAI/SIR/STS/SVM Guidelines for the Diagnosis and Management of Patients with Thoracic Aortic Disease. Circulation. 2010; 121: Z858-I502. Aortic aneurysm NOS (ICD10-I71.9) 4. Similar appearance of the known retro hilar lung mass involving the fissure. 5. Similar mediastinal lymphadenopathy. 6.  Emphysema (ICD10-J43.9). Electronically Signed   By: Misty Stanley M.D.   On: 01/15/2023 05:16   DG Chest Portable 1 View  Result Date: 01/14/2023 CLINICAL DATA:  Cough EXAM: PORTABLE CHEST 1 VIEW COMPARISON:  Chest x-ray 01/09/2023.  CT of the chest 12/25/2022 FINDINGS: Right suprahilar opacity is not well seen secondary to rotation, but is grossly unchanged corresponding to patient's mass on prior CT. There is some new minimal patchy opacities in the left lung base. The heart is mildly enlarged, unchanged. No pleural effusion or pneumothorax. No acute fractures. IMPRESSION: New minimal patchy opacities in the left lung base, which could represent atelectasis or early pneumonia. Otherwise stable exam. Electronically Signed   By: Ronney Asters M.D.   On: 01/14/2023 20:53   DG Knee 1-2 Views Left  Result Date: 01/11/2023 CLINICAL DATA:  Left lateral knee pain.  No known trauma. EXAM: LEFT KNEE - 1-2 VIEW COMPARISON:  None Available. FINDINGS: No visible joint effusion. No degenerative change of the weight-bearing compartment or patellofemoral joint.  Slightly high positioning of the patella incidentally noted. Benign appearing cortical thickening of the anterior cortex of the proximal tibial diaphysis, not significant. IMPRESSION: 1. No acute finding to explain the clinical symptoms. No visible degenerative changes. 2. Mild patella alta. Electronically Signed   By: Nelson Chimes M.D.   On: 01/11/2023 08:21   DG Chest 1 View  Result Date: 01/09/2023 CLINICAL DATA:  Cough and shortness of breath starting a few days ago with worsening headaches and lightheadedness and weakness EXAM: CHEST  1  VIEW COMPARISON:  12/28/2022 FINDINGS: Similar appearance of the right hilar mass and architectural distortion in the right upper lung. Stable cardiomediastinal silhouette. No new focal consolidation, pleural effusion, or pneumothorax. No acute osseous abnormality. IMPRESSION: No significant change from 12/28/2022. Right hilar mass and architectural distortion previously evaluated with CT 12/25/2022. Electronically Signed   By: Placido Sou M.D.   On: 01/09/2023 19:19   ECHOCARDIOGRAM COMPLETE  Result Date: 12/26/2022    ECHOCARDIOGRAM REPORT   Patient Name:   Hanish Laraia Date of Exam: 12/26/2022 Medical Rec #:  [id number]    Height:       69.0 in Accession #:    [id number]   Weight:       225.0 lb Date of Birth:  Jan 28, 1971    BSA:          2.172 m Patient Age:    13 years     BP:           114/76 mmHg Patient Gender: M            HR:           92 bpm. Exam Location:  Inpatient Procedure: 2D Echo, Cardiac Doppler and Color Doppler Indications:    Dyspnea  History:        Patient has prior history of Echocardiogram examinations, most                 recent 02/04/2022. COPD; Risk Factors:Hypertension and Current                 Smoker. Lung cancer. Hx TIA.  Sonographer:    Clayton Lefort RDCS (AE) Referring Phys: [place]  1. Left ventricular ejection fraction, by estimation, is 60 to 65%. The left ventricle has normal function. The left ventricle has no regional wall motion abnormalities. There is moderate concentric left ventricular hypertrophy. Left ventricular diastolic parameters are consistent with Grade I diastolic dysfunction (impaired relaxation).  2. Right ventricular systolic function is normal. The right ventricular size is normal.  3. The mitral valve is normal in structure. No evidence of mitral valve regurgitation. No evidence of mitral stenosis.  4. The aortic valve has an indeterminant number of cusps. Aortic valve regurgitation is mild. No aortic stenosis is present.  5. The  inferior vena cava is normal in size with greater than 50% respiratory variability, suggesting right atrial pressure of 3 mmHg. Comparison(s): No significant change from prior study. FINDINGS  Left Ventricle: Left ventricular ejection fraction, by estimation, is 60 to 65%. The left ventricle has normal function. The left ventricle has no regional wall motion abnormalities. The left ventricular internal cavity size was normal in size. There is  moderate concentric left ventricular hypertrophy. Left ventricular diastolic parameters are consistent with Grade I diastolic dysfunction (impaired relaxation). Right Ventricle: The right ventricular size is normal. Right ventricular systolic function is normal. Left Atrium: Left atrial size was normal in size. Right Atrium: Right atrial size  was normal in size. Pericardium: Trivial pericardial effusion is present. Mitral Valve: The mitral valve is normal in structure. No evidence of mitral valve regurgitation. No evidence of mitral valve stenosis. Tricuspid Valve: The tricuspid valve is normal in structure. Tricuspid valve regurgitation is trivial. No evidence of tricuspid stenosis. Aortic Valve: The aortic valve has an indeterminant number of cusps. Aortic valve regurgitation is mild. Aortic regurgitation PHT measures 409 msec. No aortic stenosis is present. Aortic valve mean gradient measures 2.0 mmHg. Aortic valve peak gradient measures 3.8 mmHg. Aortic valve area, by VTI measures 4.38 cm. Pulmonic Valve: The pulmonic valve was normal in structure. Pulmonic valve regurgitation is not visualized. No evidence of pulmonic stenosis. Aorta: The aortic root is normal in size and structure. Venous: The inferior vena cava is normal in size with greater than 50% respiratory variability, suggesting right atrial pressure of 3 mmHg. IAS/Shunts: No atrial level shunt detected by color flow Doppler.  LEFT VENTRICLE PLAX 2D LVIDd:         4.30 cm   Diastology LVIDs:         3.00 cm   LV  e' medial:    4.79 cm/s LV PW:         2.00 cm   LV E/e' medial:  13.5 LV IVS:        1.40 cm   LV e' lateral:   7.72 cm/s LVOT diam:     2.30 cm   LV E/e' lateral: 8.4 LV SV:         71 LV SV Index:   33 LVOT Area:     4.15 cm  RIGHT VENTRICLE             IVC RV Basal diam:  3.30 cm     IVC diam: 1.50 cm RV S prime:     16.50 cm/s TAPSE (M-mode): 1.9 cm LEFT ATRIUM             Index        RIGHT ATRIUM           Index LA diam:        2.80 cm 1.29 cm/m   RA Area:     13.40 cm LA Vol (A2C):   35.6 ml 16.39 ml/m  RA Volume:   32.70 ml  15.05 ml/m LA Vol (A4C):   28.3 ml 13.03 ml/m LA Biplane Vol: 32.2 ml 14.82 ml/m  AORTIC VALVE AV Area (Vmax):    4.26 cm AV Area (Vmean):   4.31 cm AV Area (VTI):     4.38 cm AV Vmax:           97.00 cm/s AV Vmean:          66.700 cm/s AV VTI:            0.163 m AV Peak Grad:      3.8 mmHg AV Mean Grad:      2.0 mmHg LVOT Vmax:         99.50 cm/s LVOT Vmean:        69.200 cm/s LVOT VTI:          0.172 m LVOT/AV VTI ratio: 1.06 AI PHT:            409 msec  AORTA Ao Root diam: 3.90 cm MITRAL VALVE MV Area (PHT): 3.21 cm    SHUNTS MV Decel Time: 236 msec    Systemic VTI:  0.17 m MV E velocity: 64.60 cm/s  Systemic Diam: 2.30  cm MV A velocity: 98.50 cm/s MV E/A ratio:  0.66 Kirk Ruths MD Electronically signed by Kirk Ruths MD Signature Date/Time: 12/26/2022/11:34:31 AM    Final      ASSESSMENT/PLAN:  This is a very pleasant 52 year old African-American male with stage IIIb (T4, N2, M0) non-small cell lung cancer, poorly differentiated carcinoma with neuroendocrine features.  He was diagnosed in January 2024.  He presented with a right large lower lobe lung mass in addition to right hilar and mediastinal lymphadenopathy.  He is status post a short course of palliative radiation which was completed in March 2023.  He is currently undergoing a course of concurrent chemoradiation with carboplatin for an AUC of 2 and paclitaxel 45 mg/m.  He underwent 1 cycle of  treatment tolerated it fairly well except he had some leg swelling which predated his first infusion and some tachycardia.   Labs were reviewed. He does not want to get treatment today. He does not have a ride. He wants to delay his treatment until 2/22. I reviewed with Dr. Julien Nordmann and explained to the patient. Discussed the chemo is radiosensitizing. Typically, we give chemo early in the week. If he does not receive treatment today, getting treatment on 2/22 and again on 2/27 is too close together. In order to have the best outcomes, I strongly encouraged the patient to have his treatments weekly on Tuesdays as scheduled. The patient understands. We will skip treatment this week but he will come in on 2/27. I encouraged him to complete the course weekly as scheduled, in which case he will have gotten 5 dose of chemo as long as he does not miss anymore. His last day of chemo will me 3/18.   His manual BP was rechecked at 167/98 today.   We will see him back for follow-up visit in 2 weeks for evaluation repeat blood work before undergoing cycle #3.  He is interested in seeing a member of the nutritionist team. I will place the referral.   He met with the social worker today.   He does not have any leg swelling at this time. I discussed avoiding foods high in sodium, elevating his legs, and using compression stockings can help reduce swelling. He had doppler ultrasound performed earlier this month which was negative for DVT.   He also was not wearing his oxygen when he came into the clinic and his oxygen was in the 80's which recovered with his supplemental oxygen. Discussed the importance of wearing his oxygen and bringing his portable tank with him.   We also discussed his smoking and emphysema today. He was strongly encouraged to quit. He reports he smokes every once in awhile  The patient was advised to call immediately if he has any concerning symptoms in the interval. The patient voices  understanding of current disease status and treatment options and is in agreement with the current care plan. All questions were answered. The patient knows to call the clinic with any problems, questions or concerns. We can certainly see the patient much sooner if necessary   Orders Placed This Encounter  Procedures   CBC with Differential ([facility] Only)    Standing Status:   Future    Number of Occurrences:   1    Standing Expiration Date:   02/02/2024   CMP ([facility] only)    Standing Status:   Future    Number of Occurrences:   1    Standing Expiration Date:   02/02/2024  The total time spent in the appointment was 20-29 minutes  Rease Wence L Ruvi Fullenwider, PA-C 02/02/23

## 2023-02-01 ENCOUNTER — Other Ambulatory Visit: Payer: Self-pay

## 2023-02-01 ENCOUNTER — Ambulatory Visit
Admission: RE | Admit: 2023-02-01 | Discharge: 2023-02-01 | Disposition: A | Discharge status: Home / Self Care | Payer: 59 | Source: Ambulatory Visit | Attending: Radiation Oncology | Admitting: Radiation Oncology

## 2023-02-01 DIAGNOSIS — Z510 Encounter for antineoplastic radiation therapy: Secondary | ICD-10-CM | POA: Diagnosis not present

## 2023-02-01 LAB — RAD ONC ARIA SESSION SUMMARY
Course Elapsed Days: 13
Plan Fractions Treated to Date: 9
Plan Prescribed Dose Per Fraction: 1.8 Gy
Plan Total Fractions Prescribed: 33
Plan Total Prescribed Dose: 59.4 Gy
Reference Point Dosage Given to Date: 16.2 Gy
Reference Point Session Dosage Given: 1.8 Gy
Session Number: 9

## 2023-02-01 MED FILL — Dexamethasone Sodium Phosphate Inj 100 MG/10ML: INTRAMUSCULAR | Qty: 1 | Status: AC

## 2023-02-02 ENCOUNTER — Inpatient Hospital Stay: Payer: 59

## 2023-02-02 ENCOUNTER — Other Ambulatory Visit: Payer: Self-pay

## 2023-02-02 ENCOUNTER — Inpatient Hospital Stay (HOSPITAL_BASED_OUTPATIENT_CLINIC_OR_DEPARTMENT_OTHER): Payer: 59 | Admitting: Physician Assistant

## 2023-02-02 ENCOUNTER — Ambulatory Visit
Admission: RE | Admit: 2023-02-02 | Discharge: 2023-02-02 | Disposition: A | Discharge status: Home / Self Care | Payer: 59 | Source: Ambulatory Visit | Attending: Radiation Oncology | Admitting: Radiation Oncology

## 2023-02-02 ENCOUNTER — Inpatient Hospital Stay: Payer: 59 | Admitting: Licensed Clinical Social Worker

## 2023-02-02 VITALS — BP 167/98 | HR 99 | Temp 97.8°F | Resp 17 | Wt 198.2 lb

## 2023-02-02 DIAGNOSIS — C3431 Malignant neoplasm of lower lobe, right bronchus or lung: Secondary | ICD-10-CM

## 2023-02-02 DIAGNOSIS — Z510 Encounter for antineoplastic radiation therapy: Secondary | ICD-10-CM | POA: Diagnosis not present

## 2023-02-02 LAB — RAD ONC ARIA SESSION SUMMARY
Course Elapsed Days: 14
Plan Fractions Treated to Date: 10
Plan Prescribed Dose Per Fraction: 1.8 Gy
Plan Total Fractions Prescribed: 33
Plan Total Prescribed Dose: 59.4 Gy
Reference Point Dosage Given to Date: 18 Gy
Reference Point Session Dosage Given: 1.8 Gy
Session Number: 10

## 2023-02-02 LAB — CMP (CANCER CENTER ONLY)
ALT: 6 U/L (ref 0–44)
AST: 11 U/L — ABNORMAL LOW (ref 15–41)
Albumin: 3.8 g/dL (ref 3.5–5.0)
Alkaline Phosphatase: 75 U/L (ref 38–126)
Anion gap: 5 (ref 5–15)
BUN: 13 mg/dL (ref 6–20)
CO2: 27 mmol/L (ref 22–32)
Calcium: 9.2 mg/dL (ref 8.9–10.3)
Chloride: 108 mmol/L (ref 98–111)
Creatinine: 0.9 mg/dL (ref 0.61–1.24)
GFR, Estimated: >60 mL/min (ref >60)
Glucose, Bld: 87 mg/dL (ref 70–99)
Potassium: 4.4 mmol/L (ref 3.5–5.1)
Sodium: 140 mmol/L (ref 135–145)
Total Bilirubin: 0.4 mg/dL (ref 0.3–1.2)
Total Protein: 6.8 g/dL (ref 6.5–8.1)

## 2023-02-02 LAB — CBC WITH DIFFERENTIAL (CANCER CENTER ONLY)
Abs Immature Granulocytes: 0.02 10*3/uL (ref 0.00–0.07)
Basophils Absolute: 0 10*3/uL (ref 0.0–0.1)
Basophils Relative: 0 %
Eosinophils Absolute: 0 10*3/uL (ref 0.0–0.5)
Eosinophils Relative: 0 %
HCT: 46.7 % (ref 39.0–52.0)
Hemoglobin: 16.1 g/dL (ref 13.0–17.0)
Immature Granulocytes: 0 %
Lymphocytes Relative: 9 %
Lymphs Abs: 0.4 10*3/uL — ABNORMAL LOW (ref 0.7–4.0)
MCH: 33 pg (ref 26.0–34.0)
MCHC: 34.5 g/dL (ref 30.0–36.0)
MCV: 95.7 fL (ref 80.0–100.0)
Monocytes Absolute: 0.8 10*3/uL (ref 0.1–1.0)
Monocytes Relative: 17 %
Neutro Abs: 3.4 10*3/uL (ref 1.7–7.7)
Neutrophils Relative %: 74 %
Platelet Count: 284 10*3/uL (ref 150–400)
RBC: 4.88 MIL/uL (ref 4.22–5.81)
RDW: 14.9 % (ref 11.5–15.5)
WBC Count: 4.6 10*3/uL (ref 4.0–10.5)
nRBC: 0 % (ref 0.0–0.2)

## 2023-02-02 NOTE — Progress Notes (Signed)
Navigator called pt to follow up with his recent clinic appt. Encounter documentation endorses the pt having to have his chemo pushed back to next week because he didn't have transportation. Patient told navigator that he didn't know about transportation services. Navigator assurred pt contact with the transportation coordinator will be made immediately.  Email to transportation coordinator, [employer], at 2:13pm.

## 2023-02-02 NOTE — Progress Notes (Signed)
Patient seen by PA today  Vitals are not all within treatment parameters. Elevated BP  Labs reviewed: and are within treatment parameters.  Per physician team, patient will not be receiving treatment today.  Infusion team made aware.
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
## 2023-02-03 ENCOUNTER — Ambulatory Visit: Payer: 59

## 2023-02-03 ENCOUNTER — Inpatient Hospital Stay: Payer: 59

## 2023-02-04 ENCOUNTER — Ambulatory Visit
Admission: RE | Admit: 2023-02-04 | Discharge: 2023-02-04 | Disposition: A | Discharge status: Home / Self Care | Payer: 59 | Source: Ambulatory Visit | Attending: Radiation Oncology | Admitting: Radiation Oncology

## 2023-02-04 ENCOUNTER — Other Ambulatory Visit: Payer: Self-pay

## 2023-02-04 DIAGNOSIS — Z510 Encounter for antineoplastic radiation therapy: Secondary | ICD-10-CM | POA: Diagnosis not present

## 2023-02-04 LAB — RAD ONC ARIA SESSION SUMMARY
Course Elapsed Days: 16
Plan Fractions Treated to Date: 11
Plan Prescribed Dose Per Fraction: 1.8 Gy
Plan Total Fractions Prescribed: 33
Plan Total Prescribed Dose: 59.4 Gy
Reference Point Dosage Given to Date: 19.8 Gy
Reference Point Session Dosage Given: 1.8 Gy
Session Number: 11

## 2023-02-05 ENCOUNTER — Ambulatory Visit
Admission: RE | Admit: 2023-02-05 | Discharge: 2023-02-05 | Disposition: A | Discharge status: Home / Self Care | Payer: 59 | Source: Ambulatory Visit | Attending: Radiation Oncology | Admitting: Radiation Oncology

## 2023-02-05 ENCOUNTER — Other Ambulatory Visit: Payer: Self-pay

## 2023-02-05 DIAGNOSIS — Z510 Encounter for antineoplastic radiation therapy: Secondary | ICD-10-CM | POA: Diagnosis not present

## 2023-02-05 LAB — RAD ONC ARIA SESSION SUMMARY
Course Elapsed Days: 17
Plan Fractions Treated to Date: 12
Plan Prescribed Dose Per Fraction: 1.8 Gy
Plan Total Fractions Prescribed: 33
Plan Total Prescribed Dose: 59.4 Gy
Reference Point Dosage Given to Date: 21.6 Gy
Reference Point Session Dosage Given: 1.8 Gy
Session Number: 12

## 2023-02-08 ENCOUNTER — Other Ambulatory Visit: Payer: Self-pay | Admitting: *Deleted

## 2023-02-08 ENCOUNTER — Encounter: Payer: Self-pay | Admitting: Internal Medicine

## 2023-02-08 ENCOUNTER — Ambulatory Visit: Admission: RE | Admit: 2023-02-08 | Payer: 59 | Source: Ambulatory Visit

## 2023-02-08 MED FILL — Dexamethasone Sodium Phosphate Inj 100 MG/10ML: INTRAMUSCULAR | Qty: 1 | Status: AC

## 2023-02-08 NOTE — Progress Notes (Signed)
Navigator called pt at 4:34pm to see if he had taken his temperature and BP. His initial BP he took when he wife arrived was 181/120. Navigator urged pt to immediately go to the ER. Pt stated he took it again about 24mn ago and it came down to 184/106. Pt states he'll check it again in about 141m and if it's still high, he will report to the ER.

## 2023-02-08 NOTE — Progress Notes (Signed)
Navigator reached out to pt 2:02pm today to make sure the patient had a ride to his treatment tomorrow. Pt endorses throwing up this morning and still feeling nauseated. Pt initially blamed his blood pressure medications, Losartan, for making him vomit. It was made clearer later that the pt was nauseated this morning, took the compazine and vomited after his compazine. He was able to hold his BP medicine down. He states his BP this morning was 118/109. His blood pressure cuff is in his wifes car and she will not be home until 4pm. Navigator asked if pts more SOB than usual and pt endorsed that his breathing is fine, however he is coughing more than usual and producing brown sputum. Pt claims his oxygen level is fine, he wears 4L [place] at home. He's not sure if he had a fever. He felt like he was "on fire" last night, but this morning that feeling was gone. It was unclear, but the pt implied that he can't take his temperature because his wife isn't there. Navigator asked the pt to check his temperature and his BP when his wife gets home. He also endorsed skin peeling on his L leg, however when asked if it's more of a scaly dryness, he states" I don't knwo, it's like a circle on my leg".  These pt updates were relayed to PA Heilingoetter. PA Heilingoetter recommended if pt's N/V doesn't resolve, then he can go to the ER where they can give him something IV. Symptom management may be able to assess his symptoms when he comes into the clinic for treatment tomorrow.  Pt seems to assume that it's the steroids he is on for his treatment that is making him feel this way. Navigator explained that they are an important part of his chemotherapy regimen, but he should discuss with the infusion clinic nurses.

## 2023-02-09 ENCOUNTER — Encounter: Payer: Self-pay | Admitting: Internal Medicine

## 2023-02-09 ENCOUNTER — Inpatient Hospital Stay: Payer: 59

## 2023-02-09 ENCOUNTER — Encounter: Payer: 59 | Admitting: Physician Assistant

## 2023-02-09 ENCOUNTER — Other Ambulatory Visit: Payer: Self-pay

## 2023-02-09 ENCOUNTER — Inpatient Hospital Stay: Payer: 59 | Admitting: Dietician

## 2023-02-09 ENCOUNTER — Ambulatory Visit
Admission: RE | Admit: 2023-02-09 | Discharge: 2023-02-09 | Disposition: A | Discharge status: Home / Self Care | Payer: 59 | Source: Ambulatory Visit | Attending: Radiation Oncology | Admitting: Radiation Oncology

## 2023-02-09 VITALS — BP 141/114 | HR 105 | Temp 98.2°F | Resp 18 | Ht 70.0 in | Wt 204.6 lb

## 2023-02-09 DIAGNOSIS — C3431 Malignant neoplasm of lower lobe, right bronchus or lung: Secondary | ICD-10-CM

## 2023-02-09 DIAGNOSIS — Z510 Encounter for antineoplastic radiation therapy: Secondary | ICD-10-CM | POA: Diagnosis not present

## 2023-02-09 LAB — RAD ONC ARIA SESSION SUMMARY
Course Elapsed Days: 21
Plan Fractions Treated to Date: 13
Plan Prescribed Dose Per Fraction: 1.8 Gy
Plan Total Fractions Prescribed: 33
Plan Total Prescribed Dose: 59.4 Gy
Reference Point Dosage Given to Date: 23.4 Gy
Reference Point Session Dosage Given: 1.8 Gy
Session Number: 13

## 2023-02-09 LAB — CBC WITH DIFFERENTIAL (CANCER CENTER ONLY)
Abs Immature Granulocytes: 0.02 10*3/uL (ref 0.00–0.07)
Basophils Absolute: 0 10*3/uL (ref 0.0–0.1)
Basophils Relative: 1 %
Eosinophils Absolute: 0 10*3/uL (ref 0.0–0.5)
Eosinophils Relative: 0 %
HCT: 48.5 % (ref 39.0–52.0)
Hemoglobin: 16 g/dL (ref 13.0–17.0)
Immature Granulocytes: 0 %
Lymphocytes Relative: 6 %
Lymphs Abs: 0.4 10*3/uL — ABNORMAL LOW (ref 0.7–4.0)
MCH: 32.3 pg (ref 26.0–34.0)
MCHC: 33 g/dL (ref 30.0–36.0)
MCV: 97.8 fL (ref 80.0–100.0)
Monocytes Absolute: 0.8 10*3/uL (ref 0.1–1.0)
Monocytes Relative: 14 %
Neutro Abs: 4.3 10*3/uL (ref 1.7–7.7)
Neutrophils Relative %: 79 %
Platelet Count: 234 10*3/uL (ref 150–400)
RBC: 4.96 MIL/uL (ref 4.22–5.81)
RDW: 15.4 % (ref 11.5–15.5)
WBC Count: 5.5 10*3/uL (ref 4.0–10.5)
nRBC: 0 % (ref 0.0–0.2)

## 2023-02-09 LAB — CMP (CANCER CENTER ONLY)
ALT: 8 U/L (ref 0–44)
AST: 11 U/L — ABNORMAL LOW (ref 15–41)
Albumin: 4.2 g/dL (ref 3.5–5.0)
Alkaline Phosphatase: 75 U/L (ref 38–126)
Anion gap: 9 (ref 5–15)
BUN: 9 mg/dL (ref 6–20)
CO2: 28 mmol/L (ref 22–32)
Calcium: 9.1 mg/dL (ref 8.9–10.3)
Chloride: 106 mmol/L (ref 98–111)
Creatinine: 0.82 mg/dL (ref 0.61–1.24)
GFR, Estimated: >60 mL/min (ref >60)
Glucose, Bld: 85 mg/dL (ref 70–99)
Potassium: 4 mmol/L (ref 3.5–5.1)
Sodium: 143 mmol/L (ref 135–145)
Total Bilirubin: 0.7 mg/dL (ref 0.3–1.2)
Total Protein: 7.6 g/dL (ref 6.5–8.1)

## 2023-02-09 MED ORDER — FAMOTIDINE IN NACL 20-0.9 MG/50ML-% IV SOLN
20.0000 mg | Freq: Once | INTRAVENOUS | Status: AC | PRN
  Administered 2023-02-09: 20 mg via INTRAVENOUS

## 2023-02-09 MED ORDER — DIPHENHYDRAMINE HCL 50 MG/ML IJ SOLN
50.0000 mg | Freq: Once | INTRAMUSCULAR | Status: AC
  Administered 2023-02-09: 50 mg via INTRAVENOUS
  Filled 2023-02-09: qty 1

## 2023-02-09 MED ORDER — PALONOSETRON HCL INJECTION 0.25 MG/5ML
0.2500 mg | Freq: Once | INTRAVENOUS | Status: AC
  Administered 2023-02-09: 0.25 mg via INTRAVENOUS
  Filled 2023-02-09: qty 5

## 2023-02-09 MED ORDER — SODIUM CHLORIDE 0.9 % IV SOLN
10.0000 mg | Freq: Once | INTRAVENOUS | Status: AC
  Administered 2023-02-09: 10 mg via INTRAVENOUS
  Filled 2023-02-09: qty 1
  Filled 2023-02-09: qty 10

## 2023-02-09 MED ORDER — SODIUM CHLORIDE 0.9 % IV SOLN: Freq: Once | INTRAVENOUS | Status: AC

## 2023-02-09 MED ORDER — SODIUM CHLORIDE 0.9 % IV SOLN
45.0000 mg/m2 | Freq: Once | INTRAVENOUS | Status: AC
  Administered 2023-02-09: 96 mg via INTRAVENOUS
  Filled 2023-02-09: qty 16

## 2023-02-09 MED ORDER — FAMOTIDINE IN NACL 20-0.9 MG/50ML-% IV SOLN
20.0000 mg | Freq: Once | INTRAVENOUS | Status: AC
  Administered 2023-02-09: 20 mg via INTRAVENOUS
  Filled 2023-02-09: qty 50

## 2023-02-09 MED ORDER — SODIUM CHLORIDE 0.9 % IV SOLN
278.4000 mg | Freq: Once | INTRAVENOUS | Status: AC
  Administered 2023-02-09: 300 mg via INTRAVENOUS
  Filled 2023-02-09: qty 30

## 2023-02-09 MED ORDER — CLONIDINE HCL 0.1 MG PO TABS
0.2000 mg | ORAL_TABLET | Freq: Once | ORAL | Status: AC
  Administered 2023-02-09: 0.2 mg via ORAL
  Filled 2023-02-09: qty 2

## 2023-02-09 NOTE — Patient Instructions (Addendum)
Richard [name]  Discharge Instructions: Thank you for choosing [facility] to provide your oncology and hematology care.   If you have a lab appointment with the [facility], please go directly to the [facility] and check in at the registration area.   Wear comfortable clothing and clothing appropriate for easy access to any Portacath or PICC line.   We strive to give you quality time with your provider. You may need to reschedule your appointment if you arrive late (15 or more minutes).  Arriving late affects you and other patients whose appointments are after yours.  Also, if you miss three or more appointments without notifying the office, you may be dismissed from the clinic at the provider's discretion.      For prescription refill requests, have your pharmacy contact our office and allow 72 hours for refills to be completed.    Today you received the following chemotherapy and/or immunotherapy agents Taxol, Carboplatin      To help prevent nausea and vomiting after your treatment, we encourage you to take your nausea medication as directed.  BELOW ARE SYMPTOMS THAT SHOULD BE REPORTED IMMEDIATELY: *FEVER GREATER THAN 100.4 F (38 C) OR HIGHER *CHILLS OR SWEATING *NAUSEA AND VOMITING THAT IS NOT CONTROLLED WITH YOUR NAUSEA MEDICATION *UNUSUAL SHORTNESS OF BREATH *UNUSUAL BRUISING OR BLEEDING *URINARY PROBLEMS (pain or burning when urinating, or frequent urination) *BOWEL PROBLEMS (unusual diarrhea, constipation, pain near the anus) TENDERNESS IN MOUTH AND THROAT WITH OR WITHOUT PRESENCE OF ULCERS (sore throat, sores in mouth, or a toothache) UNUSUAL RASH, SWELLING OR PAIN  UNUSUAL VAGINAL DISCHARGE OR ITCHING   Items with * indicate a potential emergency and should be followed up as soon as possible or go to the Emergency Department if any problems should occur.  Please show the CHEMOTHERAPY ALERT CARD or IMMUNOTHERAPY ALERT CARD  at check-in to the Emergency Department and triage nurse.  Should you have questions after your visit or need to cancel or reschedule your appointment, please contact [facility]  Dept: [phone]  and follow the prompts.  Office hours are 8:00 a.m. to 4:30 p.m. Monday - Friday. Please note that voicemails left after 4:00 p.m. may not be returned until the following business day.  We are closed weekends and major holidays. You have access to a nurse at all times for urgent questions. Please call the main number to the clinic Dept: [phone] and follow the prompts.   For any non-urgent questions, you may also contact your provider using MyChart. We now offer e-Visits for anyone 19 and older to request care online for non-urgent symptoms. For details visit [email or web address].   Also download the MyChart app! Go to the app store, search "MyChart", open the app, select [facility], and log in with your MyChart username and password.  Paclitaxel Injection What is this medication? PACLITAXEL (PAK li TAX el) treats some types of cancer. It works by slowing down the growth of cancer cells. This medicine may be used for other purposes; ask your health care provider or pharmacist if you have questions. COMMON BRAND NAME(S): Onxol, Taxol What should I tell my care team before I take this medication? They need to know if you have any of these conditions: Heart disease Liver disease Low white blood cell levels An unusual or allergic reaction to paclitaxel, other medications, foods, dyes, or preservatives If you or your partner are pregnant or trying to  get pregnant Breast-feeding How should I use this medication? This medication is injected into a vein. It is given by your care team in a hospital or clinic setting. Talk to your care team about the use of this medication in children. While it may be given to children for selected conditions, precautions do  apply. Overdosage: If you think you have taken too much of this medicine contact a poison control center or emergency room at once. NOTE: This medicine is only for you. Do not share this medicine with others. What if I miss a dose? Keep appointments for follow-up doses. It is important not to miss your dose. Call your care team if you are unable to keep an appointment. What may interact with this medication? Do not take this medication with any of the following: Live virus vaccines Other medications may affect the way this medication works. Talk with your care team about all of the medications you take. They may suggest changes to your treatment plan to lower the risk of side effects and to make sure your medications work as intended. This list may not describe all possible interactions. Give your health care provider a list of all the medicines, herbs, non-prescription drugs, or dietary supplements you use. Also tell them if you smoke, drink alcohol, or use illegal drugs. Some items may interact with your medicine. What should I watch for while using this medication? Your condition will be monitored carefully while you are receiving this medication. You may need blood work while taking this medication. This medication may make you feel generally unwell. This is not uncommon as chemotherapy can affect healthy cells as well as cancer cells. Report any side effects. Continue your course of treatment even though you feel ill unless your care team tells you to stop. This medication can cause serious allergic reactions. To reduce the risk, your care team may give you other medications to take before receiving this one. Be sure to follow the directions from your care team. This medication may increase your risk of getting an infection. Call your care team for advice if you get a fever, chills, sore throat, or other symptoms of a cold or flu. Do not treat yourself. Try to avoid being around people who are  sick. This medication may increase your risk to bruise or bleed. Call your care team if you notice any unusual bleeding. Be careful brushing or flossing your teeth or using a toothpick because you may get an infection or bleed more easily. If you have any dental work done, tell your dentist you are receiving this medication. Talk to your care team if you may be pregnant. Serious birth defects can occur if you take this medication during pregnancy. Talk to your care team before breastfeeding. Changes to your treatment plan may be needed. What side effects may I notice from receiving this medication? Side effects that you should report to your care team as soon as possible: Allergic reactions--skin rash, itching, hives, swelling of the face, lips, tongue, or throat Heart rhythm changes--fast or irregular heartbeat, dizziness, feeling faint or lightheaded, chest pain, trouble breathing Increase in blood pressure Infection--fever, chills, cough, sore throat, wounds that don't heal, pain or trouble when passing urine, general feeling of discomfort or being unwell Low blood pressure--dizziness, feeling faint or lightheaded, blurry vision Low red blood cell level--unusual weakness or fatigue, dizziness, headache, trouble breathing Painful swelling, warmth, or redness of the skin, blisters or sores at the infusion site Pain, tingling, or numbness  in the hands or feet Slow heartbeat--dizziness, feeling faint or lightheaded, confusion, trouble breathing, unusual weakness or fatigue Unusual bruising or bleeding Side effects that usually do not require medical attention (report to your care team if they continue or are bothersome): Diarrhea Hair loss Joint pain Loss of appetite Muscle pain Nausea Vomiting This list may not describe all possible side effects. Call your doctor for medical advice about side effects. You may report side effects to FDA at 1-800-FDA-1088. Where should I keep my  medication? This medication is given in a hospital or clinic. It will not be stored at home. NOTE: This sheet is a summary. It may not cover all possible information. If you have questions about this medicine, talk to your doctor, pharmacist, or health care provider.  2023 Elsevier/Gold Standard (2022-04-16 00:00:00)  [id number] Carboplatin Injection What is this medication? CARBOPLATIN (KAR boe pla tin) treats some types of cancer. It works by slowing down the growth of cancer cells. This medicine may be used for other purposes; ask your health care provider or pharmacist if you have questions. COMMON BRAND NAME(S): Paraplatin What should I tell my care team before I take this medication? They need to know if you have any of these conditions: Blood disorders Hearing problems Kidney disease Recent or ongoing radiation therapy An unusual or allergic reaction to carboplatin, cisplatin, other medications, foods, dyes, or preservatives Pregnant or trying to get pregnant Breast-feeding How should I use this medication? This medication is injected into a vein. It is given by your care team in a hospital or clinic setting. Talk to your care team about the use of this medication in children. Special care may be needed. Overdosage: If you think you have taken too much of this medicine contact a poison control center or emergency room at once. NOTE: This medicine is only for you. Do not share this medicine with others. What if I miss a dose? Keep appointments for follow-up doses. It is important not to miss your dose. Call your care team if you are unable to keep an appointment. What may interact with this medication? Medications for seizures Some antibiotics, such as amikacin, gentamicin, neomycin, streptomycin, tobramycin Vaccines This list may not describe all possible interactions. Give your health care provider a list of all the medicines, herbs, non-prescription drugs, or dietary supplements  you use. Also tell them if you smoke, drink alcohol, or use illegal drugs. Some items may interact with your medicine. What should I watch for while using this medication? Your condition will be monitored carefully while you are receiving this medication. You may need blood work while taking this medication. This medication may make you feel generally unwell. This is not uncommon, as chemotherapy can affect healthy cells as well as cancer cells. Report any side effects. Continue your course of treatment even though you feel ill unless your care team tells you to stop. In some cases, you may be given additional medications to help with side effects. Follow all directions for their use. This medication may increase your risk of getting an infection. Call your care team for advice if you get a fever, chills, sore throat, or other symptoms of a cold or flu. Do not treat yourself. Try to avoid being around people who are sick. Avoid taking medications that contain aspirin, acetaminophen, ibuprofen, naproxen, or ketoprofen unless instructed by your care team. These medications may hide a fever. Be careful brushing or flossing your teeth or using a toothpick because you may get an  infection or bleed more easily. If you have any dental work done, tell your dentist you are receiving this medication. Talk to your care team if you wish to become pregnant or think you might be pregnant. This medication can cause serious birth defects. Talk to your care team about effective forms of contraception. Do not breast-feed while taking this medication. What side effects may I notice from receiving this medication? Side effects that you should report to your care team as soon as possible: Allergic reactions--skin rash, itching, hives, swelling of the face, lips, tongue, or throat Infection--fever, chills, cough, sore throat, wounds that don't heal, pain or trouble when passing urine, general feeling of discomfort or being  unwell Low red blood cell level--unusual weakness or fatigue, dizziness, headache, trouble breathing Pain, tingling, or numbness in the hands or feet, muscle weakness, change in vision, confusion or trouble speaking, loss of balance or coordination, trouble walking, seizures Unusual bruising or bleeding Side effects that usually do not require medical attention (report to your care team if they continue or are bothersome): Hair loss Nausea Unusual weakness or fatigue Vomiting This list may not describe all possible side effects. Call your doctor for medical advice about side effects. You may report side effects to FDA at 1-800-FDA-1088. Where should I keep my medication? This medication is given in a hospital or clinic. It will not be stored at home. NOTE: This sheet is a summary. It may not cover all possible information. If you have questions about this medicine, talk to your doctor, pharmacist, or health care provider.  2023 Elsevier/Gold Standard (2008-01-21 00:00:00)

## 2023-02-09 NOTE — Progress Notes (Signed)
Nutrition Assessment   Reason for Assessment: Referral (pt request)   ASSESSMENT: 52 year old male with stage IIIb non small cell lung cancer. S/p palliative radiation (March 2023). He is currently receiving concurrent chemoradiation with weekly carboplatin + paclitaxel.   Past medical history includes HTN, stroke, DVT, brain bleed, seizures (last 2014), history of alcohol abuse, COPD, current smoker, asthma, 2L supplemental oxygen   Met with patient in office. Patient reports feeling stressed with diagnosis and treatment. He has tolerated radiation well thus far. Patient reports left lower extremity swelling after first chemotherapy treatment and nervous about this happening again. Noted pt is s/p CT venogram without evidence of thrombosis. Patient reports one episode of nausea and vomiting. He took compazine however episode of vomiting occurred after taking. Patient is monitoring in blood pressure at home, reports 100/97 this morning. His appetite is decreased. Patient reports usually eating a couple of eggs and sausage/bacon or bowl of in the morning. He often does not eat again until dinner. Patient reports increased fatigue and long naps during the day. This has affected how well he sleeps overnight. Patient is drinking ~3 bottles of water. He has lost his taste for coffee and sweet tea. Patient is constipated. Recalls last BM was late last week. He is taking stool softener.   Nutrition Focused Physical Exam: deferred    Medications: clonidine, keppra, cozaar, compazine   Labs: 2/20 labs reviewed    Anthropometrics:   Height: 5'9" Weight: 198 lb 3.2 oz  UBW: 201 lb (January 2024) BMI: 29.27   NUTRITION DIAGNOSIS: Food and nutrition related knowledge deficit related to cancer as evidenced by no prior need for associated nutrition education    INTERVENTION:  Educated on importance of adequate calorie and protein energy intake to maintain strength/weights during  treatment Encouraged smaller more frequent meals and snacks - snack ideas provided Educated on foods with protein, encouraged protein source with all meals and snacks - soft moist high protein food list given, suggested choosing mechanically soft foods more often for ease of intake  Recommend 2 Ensure Plus/equivalent BID One complimentary case Ensure Plus HP provided today  Encouraged pt to reduce amount of time spent napping during the day to promote rest overnight as well as improve nutrition Discussed strategies for constipation - handout provided Discussed importance of hydration. Pt will work to increase this. Recommend 80 ounces (5 bottles)  Samples of Pedialyte Hydration packets in berry lemonade given for pt to try Support and encouragement Contact information given    MONITORING, EVALUATION, GOAL: Patient will tolerate increased calories and protein to minimize weight loss during treatment   Next Visit: Tuesday March 19 during infusion

## 2023-02-09 NOTE — Progress Notes (Signed)
Received VO from Dr. Julien Nordmann for clonidine 0.2 mg for elevated BP. Okay to proceed with treatment with elevated BP and Pulse.

## 2023-02-10 ENCOUNTER — Other Ambulatory Visit: Payer: Self-pay

## 2023-02-10 ENCOUNTER — Ambulatory Visit
Admission: RE | Admit: 2023-02-10 | Discharge: 2023-02-10 | Disposition: A | Discharge status: Home / Self Care | Payer: 59 | Source: Ambulatory Visit | Attending: Radiation Oncology | Admitting: Radiation Oncology

## 2023-02-10 ENCOUNTER — Encounter: Payer: Self-pay | Admitting: Internal Medicine

## 2023-02-10 DIAGNOSIS — Z510 Encounter for antineoplastic radiation therapy: Secondary | ICD-10-CM | POA: Diagnosis not present

## 2023-02-10 LAB — RAD ONC ARIA SESSION SUMMARY
Course Elapsed Days: 22
Plan Fractions Treated to Date: 14
Plan Prescribed Dose Per Fraction: 1.8 Gy
Plan Total Fractions Prescribed: 33
Plan Total Prescribed Dose: 59.4 Gy
Reference Point Dosage Given to Date: 25.2 Gy
Reference Point Session Dosage Given: 1.8 Gy
Session Number: 14

## 2023-02-10 NOTE — Progress Notes (Signed)
Patient presented documentation for [employer] grant to registration.  Patient approved for one-time $1000 [employer] grant to assist with personal expenses while going through treatment. He received a copy of the approval letter and expense sheet along with the Outpatient pharmacy information. He was advised to contact me at earliest convenience to discuss grant expenses in detail. He was given my card to do so and for any additional financial questions or concerns.

## 2023-02-10 NOTE — Progress Notes (Signed)
Patient provided documents at check-in for [name].  Patient approved for one-time $1000 [employer] grant to assist with personal expenses while going through treatment. He received a copy of the approval letter and expense sheet.  He called to discuss grant expenses with the sheet present. Discussed in detail each expense and how they are submitted. He verbalized understanding. He received a gift card today from grant.  He has my card for any additional financial questions or concerns.

## 2023-02-11 ENCOUNTER — Ambulatory Visit
Admission: RE | Admit: 2023-02-11 | Discharge: 2023-02-11 | Disposition: A | Discharge status: Home / Self Care | Payer: 59 | Source: Ambulatory Visit | Attending: Radiation Oncology | Admitting: Radiation Oncology

## 2023-02-11 ENCOUNTER — Other Ambulatory Visit: Payer: Self-pay

## 2023-02-11 ENCOUNTER — Ambulatory Visit: Admission: RE | Admit: 2023-02-11 | Payer: 59 | Source: Ambulatory Visit

## 2023-02-11 DIAGNOSIS — Z510 Encounter for antineoplastic radiation therapy: Secondary | ICD-10-CM | POA: Diagnosis not present

## 2023-02-11 LAB — RAD ONC ARIA SESSION SUMMARY
Course Elapsed Days: 23
Plan Fractions Treated to Date: 15
Plan Prescribed Dose Per Fraction: 1.8 Gy
Plan Total Fractions Prescribed: 33
Plan Total Prescribed Dose: 59.4 Gy
Reference Point Dosage Given to Date: 27 Gy
Reference Point Session Dosage Given: 1.8 Gy
Session Number: 15

## 2023-02-12 ENCOUNTER — Ambulatory Visit: Payer: 59

## 2023-02-12 DIAGNOSIS — Z5111 Encounter for antineoplastic chemotherapy: Secondary | ICD-10-CM | POA: Insufficient documentation

## 2023-02-12 DIAGNOSIS — C3431 Malignant neoplasm of lower lobe, right bronchus or lung: Secondary | ICD-10-CM | POA: Insufficient documentation

## 2023-02-12 DIAGNOSIS — Z86718 Personal history of other venous thrombosis and embolism: Secondary | ICD-10-CM | POA: Insufficient documentation

## 2023-02-12 DIAGNOSIS — F17200 Nicotine dependence, unspecified, uncomplicated: Secondary | ICD-10-CM | POA: Insufficient documentation

## 2023-02-12 DIAGNOSIS — Z8673 Personal history of transient ischemic attack (TIA), and cerebral infarction without residual deficits: Secondary | ICD-10-CM | POA: Insufficient documentation

## 2023-02-12 DIAGNOSIS — Z510 Encounter for antineoplastic radiation therapy: Secondary | ICD-10-CM | POA: Insufficient documentation

## 2023-02-15 ENCOUNTER — Other Ambulatory Visit: Payer: Self-pay

## 2023-02-15 ENCOUNTER — Ambulatory Visit
Admission: RE | Admit: 2023-02-15 | Discharge: 2023-02-15 | Disposition: A | Discharge status: Home / Self Care | Payer: 59 | Source: Ambulatory Visit | Attending: Radiation Oncology | Admitting: Radiation Oncology

## 2023-02-15 DIAGNOSIS — F17200 Nicotine dependence, unspecified, uncomplicated: Secondary | ICD-10-CM | POA: Diagnosis not present

## 2023-02-15 DIAGNOSIS — Z510 Encounter for antineoplastic radiation therapy: Secondary | ICD-10-CM | POA: Diagnosis not present

## 2023-02-15 DIAGNOSIS — Z5111 Encounter for antineoplastic chemotherapy: Secondary | ICD-10-CM | POA: Diagnosis not present

## 2023-02-15 DIAGNOSIS — Z8673 Personal history of transient ischemic attack (TIA), and cerebral infarction without residual deficits: Secondary | ICD-10-CM | POA: Diagnosis not present

## 2023-02-15 DIAGNOSIS — Z86718 Personal history of other venous thrombosis and embolism: Secondary | ICD-10-CM | POA: Diagnosis not present

## 2023-02-15 DIAGNOSIS — C3431 Malignant neoplasm of lower lobe, right bronchus or lung: Secondary | ICD-10-CM | POA: Diagnosis present

## 2023-02-15 LAB — RAD ONC ARIA SESSION SUMMARY
Course Elapsed Days: 27
Plan Fractions Treated to Date: 16
Plan Prescribed Dose Per Fraction: 1.8 Gy
Plan Total Fractions Prescribed: 33
Plan Total Prescribed Dose: 59.4 Gy
Reference Point Dosage Given to Date: 28.8 Gy
Reference Point Session Dosage Given: 1.8 Gy
Session Number: 16

## 2023-02-15 MED FILL — Dexamethasone Sodium Phosphate Inj 100 MG/10ML: INTRAMUSCULAR | Qty: 1 | Status: AC

## 2023-02-16 ENCOUNTER — Telehealth: Payer: Self-pay | Admitting: Medical Oncology

## 2023-02-16 ENCOUNTER — Ambulatory Visit: Payer: 59

## 2023-02-16 ENCOUNTER — Inpatient Hospital Stay: Payer: 59

## 2023-02-16 ENCOUNTER — Inpatient Hospital Stay: Payer: 59 | Admitting: Internal Medicine

## 2023-02-16 DIAGNOSIS — Z5111 Encounter for antineoplastic chemotherapy: Secondary | ICD-10-CM | POA: Insufficient documentation

## 2023-02-16 DIAGNOSIS — C7A8 Other malignant neuroendocrine tumors: Secondary | ICD-10-CM | POA: Insufficient documentation

## 2023-02-16 DIAGNOSIS — I10 Essential (primary) hypertension: Secondary | ICD-10-CM | POA: Insufficient documentation

## 2023-02-16 DIAGNOSIS — Z79899 Other long term (current) drug therapy: Secondary | ICD-10-CM | POA: Insufficient documentation

## 2023-02-16 NOTE — Telephone Encounter (Signed)
Unable to contact pt or spouse by phone.

## 2023-02-16 NOTE — Telephone Encounter (Signed)
Pt confirmed appt for tomorrow.

## 2023-02-17 ENCOUNTER — Other Ambulatory Visit: Payer: Self-pay

## 2023-02-17 ENCOUNTER — Inpatient Hospital Stay: Payer: 59

## 2023-02-17 ENCOUNTER — Ambulatory Visit: Payer: 59

## 2023-02-17 ENCOUNTER — Ambulatory Visit
Admission: RE | Admit: 2023-02-17 | Discharge: 2023-02-17 | Disposition: A | Discharge status: Home / Self Care | Payer: 59 | Source: Ambulatory Visit | Attending: Radiation Oncology | Admitting: Radiation Oncology

## 2023-02-17 DIAGNOSIS — Z510 Encounter for antineoplastic radiation therapy: Secondary | ICD-10-CM | POA: Diagnosis not present

## 2023-02-17 LAB — RAD ONC ARIA SESSION SUMMARY
Course Elapsed Days: 29
Plan Fractions Treated to Date: 17
Plan Prescribed Dose Per Fraction: 1.8 Gy
Plan Total Fractions Prescribed: 33
Plan Total Prescribed Dose: 59.4 Gy
Reference Point Dosage Given to Date: 30.6 Gy
Reference Point Session Dosage Given: 1.8 Gy
Session Number: 17

## 2023-02-17 MED FILL — Dexamethasone Sodium Phosphate Inj 100 MG/10ML: INTRAMUSCULAR | Qty: 1 | Status: AC

## 2023-02-18 ENCOUNTER — Inpatient Hospital Stay: Payer: 59

## 2023-02-18 ENCOUNTER — Other Ambulatory Visit: Payer: Self-pay

## 2023-02-18 ENCOUNTER — Ambulatory Visit
Admission: RE | Admit: 2023-02-18 | Discharge: 2023-02-18 | Disposition: A | Discharge status: Home / Self Care | Payer: 59 | Source: Ambulatory Visit | Attending: Radiation Oncology | Admitting: Radiation Oncology

## 2023-02-18 DIAGNOSIS — Z510 Encounter for antineoplastic radiation therapy: Secondary | ICD-10-CM | POA: Diagnosis not present

## 2023-02-18 LAB — RAD ONC ARIA SESSION SUMMARY
Course Elapsed Days: 30
Plan Fractions Treated to Date: 18
Plan Prescribed Dose Per Fraction: 1.8 Gy
Plan Total Fractions Prescribed: 33
Plan Total Prescribed Dose: 59.4 Gy
Reference Point Dosage Given to Date: 32.4 Gy
Reference Point Session Dosage Given: 1.8 Gy
Session Number: 18

## 2023-02-19 ENCOUNTER — Telehealth: Payer: Self-pay | Admitting: Radiation Oncology

## 2023-02-19 ENCOUNTER — Inpatient Hospital Stay: Payer: 59

## 2023-02-19 ENCOUNTER — Ambulatory Visit: Payer: 59

## 2023-02-19 NOTE — Telephone Encounter (Signed)
3/8 @ 1:38 pm patient called to cancel appt for today due to the lost of his cousin tragically.  called no answer/Sent email to [facility] machine so they are aware.

## 2023-02-22 ENCOUNTER — Other Ambulatory Visit: Payer: Self-pay

## 2023-02-22 ENCOUNTER — Ambulatory Visit
Admission: RE | Admit: 2023-02-22 | Discharge: 2023-02-22 | Disposition: A | Discharge status: Home / Self Care | Payer: 59 | Source: Ambulatory Visit | Attending: Radiation Oncology | Admitting: Radiation Oncology

## 2023-02-22 ENCOUNTER — Inpatient Hospital Stay: Payer: 59

## 2023-02-22 DIAGNOSIS — Z510 Encounter for antineoplastic radiation therapy: Secondary | ICD-10-CM | POA: Diagnosis not present

## 2023-02-22 LAB — RAD ONC ARIA SESSION SUMMARY
Course Elapsed Days: 34
Plan Fractions Treated to Date: 19
Plan Prescribed Dose Per Fraction: 1.8 Gy
Plan Total Fractions Prescribed: 33
Plan Total Prescribed Dose: 59.4 Gy
Reference Point Dosage Given to Date: 34.2 Gy
Reference Point Session Dosage Given: 1.8 Gy
Session Number: 19

## 2023-02-22 MED FILL — Dexamethasone Sodium Phosphate Inj 100 MG/10ML: INTRAMUSCULAR | Qty: 1 | Status: AC

## 2023-02-23 ENCOUNTER — Inpatient Hospital Stay: Payer: 59

## 2023-02-23 ENCOUNTER — Telehealth: Payer: Self-pay | Admitting: Radiation Oncology

## 2023-02-23 ENCOUNTER — Telehealth: Payer: Self-pay | Admitting: Medical Oncology

## 2023-02-23 ENCOUNTER — Ambulatory Visit: Payer: 59

## 2023-02-23 NOTE — Telephone Encounter (Signed)
Pt called and stated he woke up with his BP really high and he wasn't feeling well so he contacted his PCP. He was asking about possibly pushing out his appt for XRT today. I advised pt I would notify the treatment team of this so they could determine when to r/s. Email sent to [facility] advising of the situation and best contact # that pt verified.

## 2023-02-23 NOTE — Telephone Encounter (Signed)
Cancelled appts today -  Pt cancelled his lab ,infusion ,radiation appts today because his "nose was bleeding from my high BP".   I told him he no showed last week . He said because his ride did not show up.

## 2023-02-24 ENCOUNTER — Other Ambulatory Visit: Payer: Self-pay

## 2023-02-24 ENCOUNTER — Inpatient Hospital Stay: Payer: 59

## 2023-02-24 ENCOUNTER — Ambulatory Visit
Admission: RE | Admit: 2023-02-24 | Discharge: 2023-02-24 | Disposition: A | Discharge status: Home / Self Care | Payer: 59 | Source: Ambulatory Visit | Attending: Radiation Oncology | Admitting: Radiation Oncology

## 2023-02-24 DIAGNOSIS — Z510 Encounter for antineoplastic radiation therapy: Secondary | ICD-10-CM | POA: Diagnosis not present

## 2023-02-24 LAB — RAD ONC ARIA SESSION SUMMARY
Course Elapsed Days: 36
Plan Fractions Treated to Date: 20
Plan Prescribed Dose Per Fraction: 1.8 Gy
Plan Total Fractions Prescribed: 33
Plan Total Prescribed Dose: 59.4 Gy
Reference Point Dosage Given to Date: 36 Gy
Reference Point Session Dosage Given: 1.8 Gy
Session Number: 20

## 2023-02-25 ENCOUNTER — Ambulatory Visit: Payer: 59

## 2023-02-25 ENCOUNTER — Inpatient Hospital Stay: Payer: 59

## 2023-02-26 ENCOUNTER — Ambulatory Visit: Payer: 59

## 2023-02-26 ENCOUNTER — Inpatient Hospital Stay: Payer: 59

## 2023-03-01 ENCOUNTER — Ambulatory Visit
Admission: RE | Admit: 2023-03-01 | Discharge: 2023-03-01 | Disposition: A | Discharge status: Home / Self Care | Payer: 59 | Source: Ambulatory Visit | Attending: Radiation Oncology | Admitting: Radiation Oncology

## 2023-03-01 ENCOUNTER — Other Ambulatory Visit: Payer: Self-pay

## 2023-03-01 ENCOUNTER — Inpatient Hospital Stay: Payer: 59

## 2023-03-01 DIAGNOSIS — Z510 Encounter for antineoplastic radiation therapy: Secondary | ICD-10-CM | POA: Diagnosis not present

## 2023-03-01 LAB — RAD ONC ARIA SESSION SUMMARY
Course Elapsed Days: 41
Plan Fractions Treated to Date: 21
Plan Prescribed Dose Per Fraction: 1.8 Gy
Plan Total Fractions Prescribed: 33
Plan Total Prescribed Dose: 59.4 Gy
Reference Point Dosage Given to Date: 37.8 Gy
Reference Point Session Dosage Given: 1.8 Gy
Session Number: 21

## 2023-03-01 MED FILL — Dexamethasone Sodium Phosphate Inj 100 MG/10ML: INTRAMUSCULAR | Qty: 1 | Status: AC

## 2023-03-02 ENCOUNTER — Inpatient Hospital Stay: Payer: 59

## 2023-03-02 ENCOUNTER — Ambulatory Visit
Admission: RE | Admit: 2023-03-02 | Discharge: 2023-03-02 | Disposition: A | Discharge status: Home / Self Care | Payer: 59 | Source: Ambulatory Visit | Attending: Radiation Oncology | Admitting: Radiation Oncology

## 2023-03-02 ENCOUNTER — Inpatient Hospital Stay (HOSPITAL_BASED_OUTPATIENT_CLINIC_OR_DEPARTMENT_OTHER): Payer: 59 | Admitting: Internal Medicine

## 2023-03-02 ENCOUNTER — Encounter: Payer: Self-pay | Admitting: Internal Medicine

## 2023-03-02 ENCOUNTER — Inpatient Hospital Stay: Payer: 59 | Admitting: Dietician

## 2023-03-02 ENCOUNTER — Ambulatory Visit: Payer: 59 | Admitting: Physician Assistant

## 2023-03-02 ENCOUNTER — Other Ambulatory Visit: Payer: 59

## 2023-03-02 ENCOUNTER — Ambulatory Visit: Payer: 59

## 2023-03-02 ENCOUNTER — Other Ambulatory Visit: Payer: Self-pay

## 2023-03-02 VITALS — BP 121/91 | HR 100 | Temp 97.8°F | Resp 18

## 2023-03-02 DIAGNOSIS — C3431 Malignant neoplasm of lower lobe, right bronchus or lung: Secondary | ICD-10-CM

## 2023-03-02 DIAGNOSIS — Z510 Encounter for antineoplastic radiation therapy: Secondary | ICD-10-CM | POA: Diagnosis not present

## 2023-03-02 LAB — CBC WITH DIFFERENTIAL (CANCER CENTER ONLY)
Abs Immature Granulocytes: 0.01 10*3/uL (ref 0.00–0.07)
Basophils Absolute: 0 10*3/uL (ref 0.0–0.1)
Basophils Relative: 0 %
Eosinophils Absolute: 0 10*3/uL (ref 0.0–0.5)
Eosinophils Relative: 0 %
HCT: 48.1 % (ref 39.0–52.0)
Hemoglobin: 16.3 g/dL (ref 13.0–17.0)
Immature Granulocytes: 0 %
Lymphocytes Relative: 8 %
Lymphs Abs: 0.3 10*3/uL — ABNORMAL LOW (ref 0.7–4.0)
MCH: 32.3 pg (ref 26.0–34.0)
MCHC: 33.9 g/dL (ref 30.0–36.0)
MCV: 95.2 fL (ref 80.0–100.0)
Monocytes Absolute: 0.6 10*3/uL (ref 0.1–1.0)
Monocytes Relative: 18 %
Neutro Abs: 2.4 10*3/uL (ref 1.7–7.7)
Neutrophils Relative %: 74 %
Platelet Count: 235 10*3/uL (ref 150–400)
RBC: 5.05 MIL/uL (ref 4.22–5.81)
RDW: 15.2 % (ref 11.5–15.5)
WBC Count: 3.2 10*3/uL — ABNORMAL LOW (ref 4.0–10.5)
nRBC: 0 % (ref 0.0–0.2)

## 2023-03-02 LAB — CMP (CANCER CENTER ONLY)
ALT: 7 U/L (ref 0–44)
AST: 12 U/L — ABNORMAL LOW (ref 15–41)
Albumin: 4 g/dL (ref 3.5–5.0)
Alkaline Phosphatase: 62 U/L (ref 38–126)
Anion gap: 6 (ref 5–15)
BUN: 9 mg/dL (ref 6–20)
CO2: 24 mmol/L (ref 22–32)
Calcium: 9.2 mg/dL (ref 8.9–10.3)
Chloride: 109 mmol/L (ref 98–111)
Creatinine: 0.74 mg/dL (ref 0.61–1.24)
GFR, Estimated: >60 mL/min (ref >60)
Glucose, Bld: 88 mg/dL (ref 70–99)
Potassium: 4.1 mmol/L (ref 3.5–5.1)
Sodium: 139 mmol/L (ref 135–145)
Total Bilirubin: 0.5 mg/dL (ref 0.3–1.2)
Total Protein: 7 g/dL (ref 6.5–8.1)

## 2023-03-02 LAB — RAD ONC ARIA SESSION SUMMARY
Course Elapsed Days: 42
Plan Fractions Treated to Date: 22
Plan Prescribed Dose Per Fraction: 1.8 Gy
Plan Total Fractions Prescribed: 33
Plan Total Prescribed Dose: 59.4 Gy
Reference Point Dosage Given to Date: 39.6 Gy
Reference Point Session Dosage Given: 1.8 Gy
Session Number: 22

## 2023-03-02 MED ORDER — SODIUM CHLORIDE 0.9 % IV SOLN
45.0000 mg/m2 | Freq: Once | INTRAVENOUS | Status: AC
  Administered 2023-03-02: 96 mg via INTRAVENOUS
  Filled 2023-03-02: qty 16

## 2023-03-02 MED ORDER — SODIUM CHLORIDE 0.9 % IV SOLN
10.0000 mg | Freq: Once | INTRAVENOUS | Status: AC
  Administered 2023-03-02: 10 mg via INTRAVENOUS
  Filled 2023-03-02: qty 10

## 2023-03-02 MED ORDER — FAMOTIDINE IN NACL 20-0.9 MG/50ML-% IV SOLN
20.0000 mg | Freq: Once | INTRAVENOUS | Status: AC
  Administered 2023-03-02: 20 mg via INTRAVENOUS
  Filled 2023-03-02: qty 50

## 2023-03-02 MED ORDER — SODIUM CHLORIDE 0.9 % IV SOLN: Freq: Once | INTRAVENOUS | Status: AC

## 2023-03-02 MED ORDER — DIPHENHYDRAMINE HCL 50 MG/ML IJ SOLN
50.0000 mg | Freq: Once | INTRAMUSCULAR | Status: AC
  Administered 2023-03-02: 50 mg via INTRAVENOUS
  Filled 2023-03-02: qty 1

## 2023-03-02 MED ORDER — SODIUM CHLORIDE 0.9 % IV SOLN
278.4000 mg | Freq: Once | INTRAVENOUS | Status: AC
  Administered 2023-03-02: 280 mg via INTRAVENOUS
  Filled 2023-03-02: qty 28

## 2023-03-02 MED ORDER — PALONOSETRON HCL INJECTION 0.25 MG/5ML
0.2500 mg | Freq: Once | INTRAVENOUS | Status: AC
  Administered 2023-03-02: 0.25 mg via INTRAVENOUS
  Filled 2023-03-02: qty 5

## 2023-03-02 MED ORDER — CLONIDINE HCL 0.1 MG PO TABS
0.2000 mg | ORAL_TABLET | Freq: Once | ORAL | Status: AC
  Administered 2023-03-02: 0.2 mg via ORAL
  Filled 2023-03-02: qty 2

## 2023-03-02 NOTE — Progress Notes (Signed)
[place] [phone]   [phone]  OFFICE PROGRESS NOTE  Medicine, [facility] [address] [place] [address]  DIAGNOSIS: stage IIIb (T4, N2, M0) non-small cell lung cancer, poorly differentiated carcinoma with neuroendocrine features diagnosed in January 2024 and presented with large right lower lobe lung mass in addition to right hilar and mediastinal lymphadenopathy.  PRIOR THERAPY:  status post short course of palliative radiotherapy with 30 Gray completed in March 2023.   CURRENT THERAPY: A course of concurrent chemoradiation with weekly carboplatin for AUC of 2 and paclitaxel 45 Mg/M2.  Status post 2 cycles.  INTERVAL HISTORY: Richard [name] 52 y.o. male returns to the clinic today for follow-up visit.  The patient missed 2 cycles of his chemotherapy treatment because of noncompliance.  He has uncontrolled hypertension and he is supposed to see his primary care physician next week for management of this issue.  He denied having any current shortness of breath but has occasional substernal chest pain with no radiation to the neck or arm and no diaphoresis.  He was evaluated in the emergency room for this issue with no concerning findings.  He continues to have mild cough with no hemoptysis.  He has no nausea, vomiting, diarrhea or constipation.  He has no headache or visual changes.  He has no recent weight loss or night sweats.  He is here today for evaluation before starting cycle #3.   MEDICAL HISTORY: Past Medical History:  Diagnosis Date   Asthma    Brain bleed ([facility])    DVT (deep venous thrombosis) ([place]) 02/19/2015   RLE   GERD (gastroesophageal reflux disease)    History of home oxygen therapy    Hypertension    Seizures ([place])    last episode 03/2013   Stroke [facility])     ALLERGIES:  is allergic to dilaudid [hydromorphone hcl] and tape.  MEDICATIONS:  Current Outpatient Medications  Medication Sig Dispense  Refill   albuterol (VENTOLIN HFA) 108 (90 Base) MCG/ACT inhaler Inhale 1-2 puffs into the lungs every 6 (six) hours as needed for wheezing or shortness of breath. 8 g 3   [name] 160-4.5 MCG/ACT inhaler Inhale 2 puffs into the lungs daily.     cloNIDine (CATAPRES) 0.3 MG tablet Take 1 tablet (0.3 mg total) by mouth 2 (two) times daily. (Patient not taking: Reported on 01/15/2023) 60 tablet 0   diclofenac Sodium (VOLTAREN) 1 % GEL Apply 4 g topically 4 (four) times daily. (Patient not taking: Reported on 01/15/2023)     levETIRAcetam (KEPPRA) 500 MG tablet Take 1 tablet (500 mg total) by mouth 2 (two) times daily.     losartan (COZAAR) 100 MG tablet Take 100 mg by mouth daily.     OXYGEN Inhale 2 L/min into the lungs daily as needed (for shortness of breath).     prochlorperazine (COMPAZINE) 10 MG tablet Take 1 tablet (10 mg total) by mouth every 6 (six) hours as needed for nausea or vomiting. 30 tablet 0   No current facility-administered medications for this visit.    SURGICAL HISTORY:  Past Surgical History:  Procedure Laterality Date   BRONCHIAL BIOPSY  02/10/2022   Procedure: BRONCHIAL BIOPSIES;  Surgeon: Rigoberto Noel, MD;  Location: [facility];  Service: Cardiopulmonary;;   BRONCHIAL BRUSHINGS  02/10/2022   Procedure: BRONCHIAL BRUSHINGS;  Surgeon: Rigoberto Noel, MD;  Location: [facility];  Service: Cardiopulmonary;;   BRONCHIAL NEEDLE ASPIRATION BIOPSY  02/10/2022  Procedure: BRONCHIAL NEEDLE ASPIRATION BIOPSIES;  Surgeon: Rigoberto Noel, MD;  Location: [facility];  Service: Cardiopulmonary;;   BRONCHIAL WASHINGS  02/10/2022   Procedure: BRONCHIAL WASHINGS;  Surgeon: Rigoberto Noel, MD;  Location: [facility];  Service: Cardiopulmonary;;   ENDOBRONCHIAL ULTRASOUND Bilateral 02/10/2022   Procedure: ENDOBRONCHIAL ULTRASOUND;  Surgeon: Rigoberto Noel, MD;  Location: [facility];  Service: Cardiopulmonary;  Laterality: Bilateral;   LEG SURGERY     VIDEO BRONCHOSCOPY  02/10/2022    Procedure: VIDEO BRONCHOSCOPY WITHOUT FLUORO;  Surgeon: Rigoberto Noel, MD;  Location: [facility];  Service: Cardiopulmonary;;    REVIEW OF SYSTEMS:  Constitutional: positive for fatigue Eyes: negative Ears, nose, mouth, throat, and face: negative Respiratory: positive for cough and pleurisy/chest pain Cardiovascular: negative Gastrointestinal: negative Genitourinary:negative Integument/breast: negative Hematologic/lymphatic: negative Musculoskeletal:negative Neurological: negative Behavioral/Psych: negative Endocrine: negative Allergic/Immunologic: negative   PHYSICAL EXAMINATION: General appearance: alert, cooperative, fatigued, and no distress Head: Normocephalic, without obvious abnormality, atraumatic Neck: no adenopathy, no JVD, supple, symmetrical, trachea midline, and thyroid not enlarged, symmetric, no tenderness/mass/nodules Lymph nodes: Cervical, supraclavicular, and axillary nodes normal. Resp: clear to auscultation bilaterally Back: symmetric, no curvature. ROM normal. No CVA tenderness. Cardio: regular rate and rhythm, S1, S2 normal, no murmur, click, rub or gallop GI: soft, non-tender; bowel sounds normal; no masses,  no organomegaly Extremities: extremities normal, atraumatic, no cyanosis or edema Neurologic: Alert and oriented X 3, normal strength and tone. Normal symmetric reflexes. Normal coordination and gait  ECOG PERFORMANCE STATUS: 1 - Symptomatic but completely ambulatory  Blood pressure (!) 165/122, pulse 93, temperature (!) 97.5 F (36.4 C), temperature source Oral, resp. rate 18, weight 201 lb 1 oz (91.2 kg), SpO2 92 %.  LABORATORY DATA: Lab Results  Component Value Date   WBC 5.5 02/09/2023   HGB 16.0 02/09/2023   HCT 48.5 02/09/2023   MCV 97.8 02/09/2023   PLT 234 02/09/2023      Chemistry      Component Value Date/Time   NA 143 02/09/2023 1149   K 4.0 02/09/2023 1149   CL 106 02/09/2023 1149   CO2 28 02/09/2023 1149   BUN 9 02/09/2023  1149   CREATININE 0.82 02/09/2023 1149   CREATININE 0.91 12/05/2013 1605      Component Value Date/Time   CALCIUM 9.1 02/09/2023 1149   ALKPHOS 75 02/09/2023 1149   AST 11 (L) 02/09/2023 1149   ALT 8 02/09/2023 1149   BILITOT 0.7 02/09/2023 1149       RADIOGRAPHIC STUDIES: No results found.  ASSESSMENT AND PLAN: This is a very pleasant 52 years old African-American male with stage IIIb (T4, N2, M0) non-small cell lung cancer, poorly differentiated carcinoma with neuroendocrine features diagnosed in January 2024 and presented with large right lower lobe lung mass in addition to right hilar and mediastinal lymphadenopathy.  He is status post short course of palliative radiotherapy with 30 [place] Cables completed in March 2023. He is currently on concurrent chemoradiation with weekly carboplatin for AUC of 2 1 paclitaxel 45 Mg/M2 status post 2 cycles.  He is very noncompliant with his chemotherapy treatments. The patient is here today for evaluation before starting cycle #3. I recommended for him to proceed with his treatment today as planned. For the hypertension, I will give him a dose of clonidine 0.2 milligram x 1 in the clinic.  He was also advised to see his primary care physician for management of his hypertensive issues. The patient will come back for follow-up visit in 2 weeks  for evaluation before starting cycle #5. He was advised to call immediately if he has any other concerning symptoms in the interval. The patient voices understanding of current disease status and treatment options and is in agreement with the current care plan.  All questions were answered. The patient knows to call the clinic with any problems, questions or concerns. We can certainly see the patient much sooner if necessary.  The total time spent in the appointment was 30 minutes.  Disclaimer: This note was dictated with voice recognition software. Similar sounding words can inadvertently be transcribed and may not  be corrected upon review.

## 2023-03-02 NOTE — Patient Instructions (Signed)
[facility]  Discharge Instructions: Thank you for choosing [facility] to provide your oncology and hematology care.   If you have a lab appointment with the Cancer Center, please go directly to the Cancer Center and check in at the registration area.   Wear comfortable clothing and clothing appropriate for easy access to any Portacath or PICC line.   We strive to give you quality time with your provider. You may need to reschedule your appointment if you arrive late (15 or more minutes).  Arriving late affects you and other patients whose appointments are after yours.  Also, if you miss three or more appointments without notifying the office, you may be dismissed from the clinic at the provider's discretion.      For prescription refill requests, have your pharmacy contact our office and allow 72 hours for refills to be completed.    Today you received the following chemotherapy and/or immunotherapy agents :  Paclitaxel & Carboplatin.      To help prevent nausea and vomiting after your treatment, we encourage you to take your nausea medication as directed.  BELOW ARE SYMPTOMS THAT SHOULD BE REPORTED IMMEDIATELY: *FEVER GREATER THAN 100.4 F (38 C) OR HIGHER *CHILLS OR SWEATING *NAUSEA AND VOMITING THAT IS NOT CONTROLLED WITH YOUR NAUSEA MEDICATION *UNUSUAL SHORTNESS OF BREATH *UNUSUAL BRUISING OR BLEEDING *URINARY PROBLEMS (pain or burning when urinating, or frequent urination) *BOWEL PROBLEMS (unusual diarrhea, constipation, pain near the anus) TENDERNESS IN MOUTH AND THROAT WITH OR WITHOUT PRESENCE OF ULCERS (sore throat, sores in mouth, or a toothache) UNUSUAL RASH, SWELLING OR PAIN  UNUSUAL VAGINAL DISCHARGE OR ITCHING   Items with * indicate a potential emergency and should be followed up as soon as possible or go to the Emergency Department if any problems should occur.  Please show the CHEMOTHERAPY ALERT CARD or IMMUNOTHERAPY  ALERT CARD at check-in to the Emergency Department and triage nurse.  Should you have questions after your visit or need to cancel or reschedule your appointment, please contact [facility]  Dept: [phone]  and follow the prompts.  Office hours are 8:00 a.m. to 4:30 p.m. Monday - Friday. Please note that voicemails left after 4:00 p.m. may not be returned until the following business day.  We are closed weekends and major holidays. You have access to a nurse at all times for urgent questions. Please call the main number to the clinic Dept: [phone] and follow the prompts.   For any non-urgent questions, you may also contact your provider using MyChart. We now offer e-Visits for anyone 18 and older to request care online for non-urgent symptoms. For details visit [email or web address].   Also download the MyChart app! Go to the app store, search "MyChart", open the app, select , and log in with your MyChart username and password. 

## 2023-03-02 NOTE — Progress Notes (Signed)
Nutrition Follow-up:  Patient with stage IIIb non small cell lung cancer. S/p palliative radiation (March 2023). He is currently receiving concurrent chemoradiation with weekly carboplatin + paclitaxel.   Brief nutrition follow-up completed with patient during infusion. Patient is sleepy s/p IV Benadryl. He reports appetite has been pretty good. He unable to provide recall. Patient denies nausea or vomiting. He says bowels are moving "so so." Patient states he is monitoring blood pressures at home.  Medications: reviewed   Labs: reviewed   Anthropometrics: Wt 201 lb 1 oz today   2/27 - 204 lb 9.4 oz  2/20 - 198 lb 3.2 oz  2/12 - 201 lb 8 oz     NUTRITION DIAGNOSIS: Food and nutrition related knowledge deficit continues    INTERVENTION:  Continue Ensure Plus twice daily     MONITORING, EVALUATION, GOAL: weight trends, intake    NEXT VISIT: To be scheduled as needed

## 2023-03-02 NOTE — Progress Notes (Signed)
Patient seen by MD today  Vitals are not all within treatment parameters. BP 165/22.   Labs reviewed: and are within treatment parameters.  Per physician team, patient is ready for treatment and there are NO modifications to the treatment plan. Per Dr Julien Nordmann he ordered clonidine to give to pt before tx.

## 2023-03-02 NOTE — Patient Instructions (Signed)
Steps to Quit Smoking Smoking tobacco is the leading cause of preventable death. It can affect almost every organ in the body. Smoking puts you and people around you at risk for many serious, long-lasting (chronic) diseases. Quitting smoking can be hard, but it is one of the best things that you can do for your health. It is never too late to quit. Do not give up if you cannot quit the first time. Some people need to try many times to quit. Do your best to stick to your quit plan, and talk with your doctor if you have any questions or concerns. How do I get ready to quit? Pick a date to quit. Set a date within the next 2 weeks to give you time to prepare. Write down the reasons why you are quitting. Keep this list in places where you will see it often. Tell your family, friends, and co-workers that you are quitting. Their support is important. Talk with your doctor about the choices that may help you quit. Find out if your health insurance will pay for these treatments. Know the people, places, things, and activities that make you want to smoke (triggers). Avoid them. What first steps can I take to quit smoking? Throw away all cigarettes at home, at work, and in your car. Throw away the things that you use when you smoke, such as ashtrays and lighters. Clean your car. Empty the ashtray. Clean your home, including curtains and carpets. What can I do to help me quit smoking? Talk with your doctor about taking medicines and seeing a counselor. You are more likely to succeed when you do both. If you are pregnant or breastfeeding: Talk with your doctor about counseling or other ways to quit smoking. Do not take medicine to help you quit smoking unless your doctor tells you to. Quit right away Quit smoking completely, instead of slowly cutting back on how much you smoke over a period of time. Stopping smoking right away may be more successful than slowly quitting. Go to counseling. In-person is best  if this is an option. You are more likely to quit if you go to counseling sessions regularly. Take medicine You may take medicines to help you quit. Some medicines need a prescription, and some you can buy over-the-counter. Some medicines may contain a drug called nicotine to replace the nicotine in cigarettes. Medicines may: Help you stop having the desire to smoke (cravings). Help to stop the problems that come when you stop smoking (withdrawal symptoms). Your doctor may ask you to use: Nicotine patches, gum, or lozenges. Nicotine inhalers or sprays. Non-nicotine medicine that you take by mouth. Find resources Find resources and other ways to help you quit smoking and remain smoke-free after you quit. They include: Online chats with a counselor. Phone quitlines. Printed self-help materials. Support groups or group counseling. Text messaging programs. Mobile phone apps. Use apps on your mobile phone or tablet that can help you stick to your quit plan. Examples of free services include Quit Guide from the CDC and smokefree.gov  What can I do to make it easier to quit?  Talk to your family and friends. Ask them to support and encourage you. Call a phone quitline, such as 1-800-QUIT-NOW, reach out to support groups, or work with a counselor. Ask people who smoke to not smoke around you. Avoid places that make you want to smoke, such as: Bars. Parties. Smoke-break areas at work. Spend time with people who do not smoke. Lower   the stress in your life. Stress can make you want to smoke. Try these things to lower stress: Getting regular exercise. Doing deep-breathing exercises. Doing yoga. Meditating. What benefits will I see if I quit smoking? Over time, you may have: A better sense of smell and taste. Less coughing and sore throat. A slower heart rate. Lower blood pressure. Clearer skin. Better breathing. Fewer sick days. Summary Quitting smoking can be hard, but it is one of  the best things that you can do for your health. Do not give up if you cannot quit the first time. Some people need to try many times to quit. When you decide to quit smoking, make a plan to help you succeed. Quit smoking right away, not slowly over a period of time. When you start quitting, get help and support to keep you smoke-free. This information is not intended to replace advice given to you by your health care provider. Make sure you discuss any questions you have with your health care provider. Document Revised: 11/21/2021 Document Reviewed: 11/21/2021 Elsevier Patient Education  2023 [employer]  

## 2023-03-03 ENCOUNTER — Other Ambulatory Visit: Payer: Self-pay

## 2023-03-03 ENCOUNTER — Ambulatory Visit
Admission: RE | Admit: 2023-03-03 | Discharge: 2023-03-03 | Disposition: A | Discharge status: Home / Self Care | Payer: 59 | Source: Ambulatory Visit | Attending: Radiation Oncology | Admitting: Radiation Oncology

## 2023-03-03 DIAGNOSIS — Z510 Encounter for antineoplastic radiation therapy: Secondary | ICD-10-CM | POA: Diagnosis not present

## 2023-03-03 LAB — RAD ONC ARIA SESSION SUMMARY
Course Elapsed Days: 43
Plan Fractions Treated to Date: 23
Plan Prescribed Dose Per Fraction: 1.8 Gy
Plan Total Fractions Prescribed: 33
Plan Total Prescribed Dose: 59.4 Gy
Reference Point Dosage Given to Date: 41.4 Gy
Reference Point Session Dosage Given: 1.8 Gy
Session Number: 23

## 2023-03-04 ENCOUNTER — Ambulatory Visit: Payer: 59

## 2023-03-04 ENCOUNTER — Other Ambulatory Visit: Payer: Self-pay

## 2023-03-04 ENCOUNTER — Ambulatory Visit
Admission: RE | Admit: 2023-03-04 | Discharge: 2023-03-04 | Disposition: A | Discharge status: Home / Self Care | Payer: 59 | Source: Ambulatory Visit | Attending: Radiation Oncology | Admitting: Radiation Oncology

## 2023-03-04 DIAGNOSIS — Z510 Encounter for antineoplastic radiation therapy: Secondary | ICD-10-CM | POA: Diagnosis not present

## 2023-03-04 LAB — RAD ONC ARIA SESSION SUMMARY
Course Elapsed Days: 44
Plan Fractions Treated to Date: 24
Plan Prescribed Dose Per Fraction: 1.8 Gy
Plan Total Fractions Prescribed: 33
Plan Total Prescribed Dose: 59.4 Gy
Reference Point Dosage Given to Date: 43.2 Gy
Reference Point Session Dosage Given: 1.8 Gy
Session Number: 24

## 2023-03-05 ENCOUNTER — Ambulatory Visit
Admission: RE | Admit: 2023-03-05 | Discharge: 2023-03-05 | Disposition: A | Discharge status: Home / Self Care | Payer: 59 | Source: Ambulatory Visit | Attending: Radiation Oncology | Admitting: Radiation Oncology

## 2023-03-05 ENCOUNTER — Ambulatory Visit: Payer: 59

## 2023-03-05 ENCOUNTER — Other Ambulatory Visit: Payer: Self-pay

## 2023-03-05 ENCOUNTER — Other Ambulatory Visit: Payer: Self-pay | Admitting: Physician Assistant

## 2023-03-05 DIAGNOSIS — C3431 Malignant neoplasm of lower lobe, right bronchus or lung: Secondary | ICD-10-CM

## 2023-03-05 DIAGNOSIS — Z510 Encounter for antineoplastic radiation therapy: Secondary | ICD-10-CM | POA: Diagnosis not present

## 2023-03-05 LAB — RAD ONC ARIA SESSION SUMMARY
Course Elapsed Days: 45
Plan Fractions Treated to Date: 25
Plan Prescribed Dose Per Fraction: 1.8 Gy
Plan Total Fractions Prescribed: 33
Plan Total Prescribed Dose: 59.4 Gy
Reference Point Dosage Given to Date: 45 Gy
Reference Point Session Dosage Given: 1.8 Gy
Session Number: 25

## 2023-03-08 ENCOUNTER — Other Ambulatory Visit: Payer: Self-pay

## 2023-03-08 ENCOUNTER — Ambulatory Visit: Payer: 59

## 2023-03-08 ENCOUNTER — Inpatient Hospital Stay: Payer: 59

## 2023-03-08 ENCOUNTER — Ambulatory Visit
Admission: RE | Admit: 2023-03-08 | Discharge: 2023-03-08 | Disposition: A | Discharge status: Home / Self Care | Payer: 59 | Source: Ambulatory Visit | Attending: Radiation Oncology | Admitting: Radiation Oncology

## 2023-03-08 DIAGNOSIS — Z510 Encounter for antineoplastic radiation therapy: Secondary | ICD-10-CM | POA: Diagnosis not present

## 2023-03-08 DIAGNOSIS — C3431 Malignant neoplasm of lower lobe, right bronchus or lung: Secondary | ICD-10-CM

## 2023-03-08 LAB — RAD ONC ARIA SESSION SUMMARY
Course Elapsed Days: 48
Plan Fractions Treated to Date: 26
Plan Prescribed Dose Per Fraction: 1.8 Gy
Plan Total Fractions Prescribed: 33
Plan Total Prescribed Dose: 59.4 Gy
Reference Point Dosage Given to Date: 46.8 Gy
Reference Point Session Dosage Given: 1.8 Gy
Session Number: 26

## 2023-03-08 MED FILL — Dexamethasone Sodium Phosphate Inj 100 MG/10ML: INTRAMUSCULAR | Qty: 1 | Status: AC

## 2023-03-09 ENCOUNTER — Other Ambulatory Visit: Payer: Self-pay

## 2023-03-09 ENCOUNTER — Ambulatory Visit: Payer: 59

## 2023-03-09 ENCOUNTER — Ambulatory Visit
Admission: RE | Admit: 2023-03-09 | Discharge: 2023-03-09 | Disposition: A | Discharge status: Home / Self Care | Payer: 59 | Source: Ambulatory Visit | Attending: Radiation Oncology | Admitting: Radiation Oncology

## 2023-03-09 ENCOUNTER — Inpatient Hospital Stay: Payer: 59

## 2023-03-09 DIAGNOSIS — Z510 Encounter for antineoplastic radiation therapy: Secondary | ICD-10-CM | POA: Diagnosis not present

## 2023-03-09 LAB — RAD ONC ARIA SESSION SUMMARY
Course Elapsed Days: 49
Plan Fractions Treated to Date: 27
Plan Prescribed Dose Per Fraction: 1.8 Gy
Plan Total Fractions Prescribed: 33
Plan Total Prescribed Dose: 59.4 Gy
Reference Point Dosage Given to Date: 48.6 Gy
Reference Point Session Dosage Given: 1.8 Gy
Session Number: 27

## 2023-03-10 ENCOUNTER — Ambulatory Visit
Admission: RE | Admit: 2023-03-10 | Discharge: 2023-03-10 | Disposition: A | Discharge status: Home / Self Care | Payer: 59 | Source: Ambulatory Visit | Attending: Radiation Oncology | Admitting: Radiation Oncology

## 2023-03-10 ENCOUNTER — Inpatient Hospital Stay: Payer: 59

## 2023-03-10 ENCOUNTER — Other Ambulatory Visit: Payer: Self-pay

## 2023-03-10 ENCOUNTER — Ambulatory Visit: Payer: 59

## 2023-03-10 DIAGNOSIS — Z510 Encounter for antineoplastic radiation therapy: Secondary | ICD-10-CM | POA: Diagnosis not present

## 2023-03-10 LAB — RAD ONC ARIA SESSION SUMMARY
Course Elapsed Days: 50
Plan Fractions Treated to Date: 28
Plan Prescribed Dose Per Fraction: 1.8 Gy
Plan Total Fractions Prescribed: 33
Plan Total Prescribed Dose: 59.4 Gy
Reference Point Dosage Given to Date: 50.4 Gy
Reference Point Session Dosage Given: 1.8 Gy
Session Number: 28

## 2023-03-11 ENCOUNTER — Inpatient Hospital Stay: Payer: 59

## 2023-03-11 ENCOUNTER — Other Ambulatory Visit: Payer: Self-pay

## 2023-03-11 ENCOUNTER — Ambulatory Visit
Admission: RE | Admit: 2023-03-11 | Discharge: 2023-03-11 | Disposition: A | Discharge status: Home / Self Care | Payer: 59 | Source: Ambulatory Visit | Attending: Radiation Oncology | Admitting: Radiation Oncology

## 2023-03-11 ENCOUNTER — Ambulatory Visit: Payer: 59

## 2023-03-11 DIAGNOSIS — Z510 Encounter for antineoplastic radiation therapy: Secondary | ICD-10-CM | POA: Diagnosis not present

## 2023-03-11 LAB — RAD ONC ARIA SESSION SUMMARY
Course Elapsed Days: 51
Plan Fractions Treated to Date: 29
Plan Prescribed Dose Per Fraction: 1.8 Gy
Plan Total Fractions Prescribed: 33
Plan Total Prescribed Dose: 59.4 Gy
Reference Point Dosage Given to Date: 52.2 Gy
Reference Point Session Dosage Given: 1.8 Gy
Session Number: 29

## 2023-03-12 ENCOUNTER — Other Ambulatory Visit: Payer: Self-pay

## 2023-03-12 ENCOUNTER — Ambulatory Visit: Payer: 59

## 2023-03-12 ENCOUNTER — Inpatient Hospital Stay: Payer: 59

## 2023-03-12 ENCOUNTER — Other Ambulatory Visit: Payer: Self-pay | Admitting: Physician Assistant

## 2023-03-12 ENCOUNTER — Telehealth: Payer: Self-pay | Admitting: Internal Medicine

## 2023-03-12 ENCOUNTER — Ambulatory Visit
Admission: RE | Admit: 2023-03-12 | Discharge: 2023-03-12 | Disposition: A | Discharge status: Home / Self Care | Payer: 59 | Source: Ambulatory Visit | Attending: Radiation Oncology | Admitting: Radiation Oncology

## 2023-03-12 DIAGNOSIS — Z510 Encounter for antineoplastic radiation therapy: Secondary | ICD-10-CM | POA: Diagnosis not present

## 2023-03-12 DIAGNOSIS — C3431 Malignant neoplasm of lower lobe, right bronchus or lung: Secondary | ICD-10-CM

## 2023-03-12 LAB — RAD ONC ARIA SESSION SUMMARY
Course Elapsed Days: 52
Plan Fractions Treated to Date: 30
Plan Prescribed Dose Per Fraction: 1.8 Gy
Plan Total Fractions Prescribed: 33
Plan Total Prescribed Dose: 59.4 Gy
Reference Point Dosage Given to Date: 54 Gy
Reference Point Session Dosage Given: 1.8 Gy
Session Number: 30

## 2023-03-12 MED FILL — Dexamethasone Sodium Phosphate Inj 100 MG/10ML: INTRAMUSCULAR | Qty: 1 | Status: AC

## 2023-03-12 NOTE — Progress Notes (Deleted)
[place] OFFICE PROGRESS NOTE  Medicine, [facility] [address] [place] [address]  DIAGNOSIS: Stage IIIb (T4, N2, M0) non-small cell lung cancer, poorly differentiated carcinoma with neuroendocrine features diagnosed in January 2024 and presented with large right lower lobe lung mass in addition to right hilar and mediastinal lymphadenopathy.   PRIOR THERAPY: Status post short course of palliative radiotherapy with 30 Gray completed in March 2023.   CURRENT THERAPY:  Course of concurrent chemoradiation with weekly carboplatin for AUC of 2 and paclitaxel 45 Mg/M2. Status post 3 cycles   INTERVAL HISTORY: Richard [name] 52 y.o. male returns clinic today for follow-up visit.  The patient is currently undergoing a course of concurrent chemoradiation.  He has missed several days of chemotherapy due to noncompliance.  He is only received 3 cycles of treatment.  He also has been having some ongoing concerns related to significant hypertension for which she was supposed to follow with his PCP which was strongly suggested on innumerable occasions as well as prior ER visits.  He last received chemotherapy on 03/02/2023.  His last day of radiation is scheduled for later this week on 03/17/23.  Tolerated his last cycle of chemotherapy on 03/02/2023 well without any concerning adverse side effects.  The patient denies any fevers, chills, night sweats, or unexplained weight loss.  Denies chest pain at this time although he states he occasionally experiences chest pain.  He sometimes may have a mild cough at nighttime.  He is on 4 L of supplemental oxygen.  He occasionally has dyspnea on exertion.  Denies any nausea, vomiting, diarrhea, or constipation.  Denies any headache or visual changes.  He is evaluated by member the nutritionist team.  He is here today for evaluation repeat blood work before undergoing his last cycle of chemotherapy.  MEDICAL HISTORY: Past Medical History:   Diagnosis Date   Asthma    Brain bleed ([place])    DVT (deep venous thrombosis) ([place]) 02/19/2015   RLE   GERD (gastroesophageal reflux disease)    History of home oxygen therapy    Hypertension    Seizures ([place])    last episode 03/2013   Stroke [facility])     ALLERGIES:  is allergic to dilaudid [hydromorphone hcl] and tape.  MEDICATIONS:  Current Outpatient Medications  Medication Sig Dispense Refill   albuterol (VENTOLIN HFA) 108 (90 Base) MCG/ACT inhaler Inhale 1-2 puffs into the lungs every 6 (six) hours as needed for wheezing or shortness of breath. 8 g 3   [name] 160-4.5 MCG/ACT inhaler Inhale 2 puffs into the lungs daily.     cloNIDine (CATAPRES) 0.3 MG tablet Take 1 tablet (0.3 mg total) by mouth 2 (two) times daily. (Patient not taking: Reported on 01/15/2023) 60 tablet 0   diclofenac Sodium (VOLTAREN) 1 % GEL Apply 4 g topically 4 (four) times daily. (Patient not taking: Reported on 01/15/2023)     levETIRAcetam (KEPPRA) 500 MG tablet Take 1 tablet (500 mg total) by mouth 2 (two) times daily.     losartan (COZAAR) 100 MG tablet Take 100 mg by mouth daily.     OXYGEN Inhale 2 L/min into the lungs daily as needed (for shortness of breath).     prochlorperazine (COMPAZINE) 10 MG tablet Take 1 tablet (10 mg total) by mouth every 6 (six) hours as needed for nausea or vomiting. 30 tablet 0   No current facility-administered medications for this visit.    SURGICAL HISTORY:  Past Surgical  History:  Procedure Laterality Date   BRONCHIAL BIOPSY  02/10/2022   Procedure: BRONCHIAL BIOPSIES;  Surgeon: Rigoberto Noel, MD;  Location: [facility];  Service: Cardiopulmonary;;   BRONCHIAL BRUSHINGS  02/10/2022   Procedure: BRONCHIAL BRUSHINGS;  Surgeon: Rigoberto Noel, MD;  Location: [facility];  Service: Cardiopulmonary;;   BRONCHIAL NEEDLE ASPIRATION BIOPSY  02/10/2022   Procedure: BRONCHIAL NEEDLE ASPIRATION BIOPSIES;  Surgeon: Rigoberto Noel, MD;  Location: [facility];  Service:  Cardiopulmonary;;   BRONCHIAL WASHINGS  02/10/2022   Procedure: BRONCHIAL WASHINGS;  Surgeon: Rigoberto Noel, MD;  Location: [facility];  Service: Cardiopulmonary;;   ENDOBRONCHIAL ULTRASOUND Bilateral 02/10/2022   Procedure: ENDOBRONCHIAL ULTRASOUND;  Surgeon: Rigoberto Noel, MD;  Location: [facility];  Service: Cardiopulmonary;  Laterality: Bilateral;   LEG SURGERY     VIDEO BRONCHOSCOPY  02/10/2022   Procedure: VIDEO BRONCHOSCOPY WITHOUT FLUORO;  Surgeon: Rigoberto Noel, MD;  Location: [facility];  Service: Cardiopulmonary;;    REVIEW OF SYSTEMS:   Review of Systems  Constitutional: Negative for appetite change, chills, fatigue, fever and unexpected weight change.  HENT:   Negative for mouth sores, nosebleeds, sore throat and trouble swallowing.   Eyes: Negative for eye problems and icterus.  Respiratory: Negative for cough, hemoptysis, shortness of breath and wheezing.   Cardiovascular: Negative for chest pain and leg swelling.  Gastrointestinal: Negative for abdominal pain, constipation, diarrhea, nausea and vomiting.  Genitourinary: Negative for bladder incontinence, difficulty urinating, dysuria, frequency and hematuria.   Musculoskeletal: Negative for back pain, gait problem, neck pain and neck stiffness.  Skin: Negative for itching and rash.  Neurological: Negative for dizziness, extremity weakness, gait problem, headaches, light-headedness and seizures.  Hematological: Negative for adenopathy. Does not bruise/bleed easily.  Psychiatric/Behavioral: Negative for confusion, depression and sleep disturbance. The patient is not nervous/anxious.     PHYSICAL EXAMINATION:  There were no vitals taken for this visit.  ECOG PERFORMANCE STATUS: {CHL ONC ECOG [id number]  Physical Exam  Constitutional: Oriented to person, place, and time and well-developed, well-nourished, and in no distress. No distress.  HENT:  Head: Normocephalic and atraumatic.  Mouth/Throat: Oropharynx is  clear and moist. No oropharyngeal exudate.  Eyes: Conjunctivae are normal. Right eye exhibits no discharge. Left eye exhibits no discharge. No scleral icterus.  Neck: Normal range of motion. Neck supple.  Cardiovascular: Normal rate, regular rhythm, normal heart sounds and intact distal pulses.   Pulmonary/Chest: Effort normal and breath sounds normal. No respiratory distress. No wheezes. No rales.  Abdominal: Soft. Bowel sounds are normal. Exhibits no distension and no mass. There is no tenderness.  Musculoskeletal: Normal range of motion. Exhibits no edema.  Lymphadenopathy:    No cervical adenopathy.  Neurological: Alert and oriented to person, place, and time. Exhibits normal muscle tone. Gait normal. Coordination normal.  Skin: Skin is warm and dry. No rash noted. Not diaphoretic. No erythema. No pallor.  Psychiatric: Mood, memory and judgment normal.  Vitals reviewed.  LABORATORY DATA: Lab Results  Component Value Date   WBC 3.2 (L) 03/02/2023   HGB 16.3 03/02/2023   HCT 48.1 03/02/2023   MCV 95.2 03/02/2023   PLT 235 03/02/2023      Chemistry      Component Value Date/Time   NA 139 03/02/2023 0803   K 4.1 03/02/2023 0803   CL 109 03/02/2023 0803   CO2 24 03/02/2023 0803   BUN 9 03/02/2023 0803   CREATININE 0.74 03/02/2023 0803   CREATININE 0.91 12/05/2013 1605  Component Value Date/Time   CALCIUM 9.2 03/02/2023 0803   ALKPHOS 62 03/02/2023 0803   AST 12 (L) 03/02/2023 0803   ALT 7 03/02/2023 0803   BILITOT 0.5 03/02/2023 0803       RADIOGRAPHIC STUDIES:  No results found.   ASSESSMENT/PLAN:  This is a very pleasant 52 year old African-American male with stage IIIb (T4, N2, M0) non-small cell lung cancer, poorly differentiated carcinoma with neuroendocrine features.  He was diagnosed in January 2024.  He presented with a right large lower lobe lung mass in addition to right hilar and mediastinal lymphadenopathy.  He is status post a short course of  palliative radiation which was completed in March 2023.   He is currently undergoing a course of concurrent chemoradiation with carboplatin for an AUC of 2 and paclitaxel 45 mg/m.  He underwent 3 cycle of treatment tolerated it fairly well except***he had some leg swelling which predated his first infusion and some tachycardia.  Patient's last day radiation is scheduled for 03/17/2023.  The patient is noncompliant with his appointments.  He is also noncompliant with his hypertension and management.  Labs were reviewed.  Recommend that he proceed with cycle #4 today as scheduled.  I will arrange for restaging CT scan of the chest approximately 3 weeks after his last day radiation.  We will then see him back for follow-up appointment a few days after his CT scan to review the results and the next steps in his care.  Blood pressure  Clonidine?  Nutritionist  Patient is supposed be wearing 4 L of supplemental oxygen ***  The patient was advised to call immediately if he has any concerning symptoms in the interval. The patient voices understanding of current disease status and treatment options and is in agreement with the current care plan. All questions were answered. The patient knows to call the clinic with any problems, questions or concerns. We can certainly see the patient much sooner if necessary   No orders of the defined types were placed in this encounter.    I spent {CHL ONC TIME VISIT - [id number] counseling the patient face to face. The total time spent in the appointment was {CHL ONC TIME VISIT - [id number].  Ayonna Speranza L Joely Losier, PA-C 03/12/23

## 2023-03-12 NOTE — Telephone Encounter (Signed)
Called patient regarding upcoming 04/01 appointments, patient is notified.

## 2023-03-15 ENCOUNTER — Inpatient Hospital Stay: Payer: 59 | Admitting: Physician Assistant

## 2023-03-15 ENCOUNTER — Inpatient Hospital Stay: Payer: 59 | Attending: Internal Medicine

## 2023-03-15 ENCOUNTER — Other Ambulatory Visit: Payer: Self-pay

## 2023-03-15 ENCOUNTER — Inpatient Hospital Stay: Payer: 59

## 2023-03-15 ENCOUNTER — Ambulatory Visit
Admission: RE | Admit: 2023-03-15 | Discharge: 2023-03-15 | Disposition: A | Discharge status: Home / Self Care | Payer: 59 | Source: Ambulatory Visit | Attending: Radiation Oncology | Admitting: Radiation Oncology

## 2023-03-15 ENCOUNTER — Telehealth: Payer: Self-pay

## 2023-03-15 ENCOUNTER — Ambulatory Visit: Payer: 59

## 2023-03-15 DIAGNOSIS — C3431 Malignant neoplasm of lower lobe, right bronchus or lung: Secondary | ICD-10-CM | POA: Diagnosis present

## 2023-03-15 DIAGNOSIS — Z86718 Personal history of other venous thrombosis and embolism: Secondary | ICD-10-CM | POA: Insufficient documentation

## 2023-03-15 DIAGNOSIS — Z510 Encounter for antineoplastic radiation therapy: Secondary | ICD-10-CM | POA: Insufficient documentation

## 2023-03-15 DIAGNOSIS — Z5111 Encounter for antineoplastic chemotherapy: Secondary | ICD-10-CM | POA: Diagnosis not present

## 2023-03-15 DIAGNOSIS — Z8673 Personal history of transient ischemic attack (TIA), and cerebral infarction without residual deficits: Secondary | ICD-10-CM | POA: Insufficient documentation

## 2023-03-15 DIAGNOSIS — F17200 Nicotine dependence, unspecified, uncomplicated: Secondary | ICD-10-CM | POA: Diagnosis not present

## 2023-03-15 LAB — RAD ONC ARIA SESSION SUMMARY
Course Elapsed Days: 55
Plan Fractions Treated to Date: 31
Plan Prescribed Dose Per Fraction: 1.8 Gy
Plan Total Fractions Prescribed: 33
Plan Total Prescribed Dose: 59.4 Gy
Reference Point Dosage Given to Date: 55.8 Gy
Reference Point Session Dosage Given: 1.8 Gy
Session Number: 31

## 2023-03-15 NOTE — Telephone Encounter (Signed)
This nurse attempted to reach this patient related to missed appointments.  The phone rung four times and then a advised me to try my call again later.  This nurse unable to leave a message. Provider made aware.  No further concerns noted at this time.

## 2023-03-15 NOTE — Telephone Encounter (Signed)
This nurse reached out to patients wife related to missed appointments.  Left a message for return call to the clinic. Provider is aware.  No further questions or concerns.

## 2023-03-16 ENCOUNTER — Ambulatory Visit
Admission: RE | Admit: 2023-03-16 | Discharge: 2023-03-16 | Disposition: A | Discharge status: Home / Self Care | Payer: 59 | Source: Ambulatory Visit | Attending: Radiation Oncology | Admitting: Radiation Oncology

## 2023-03-16 ENCOUNTER — Inpatient Hospital Stay: Payer: 59

## 2023-03-16 ENCOUNTER — Other Ambulatory Visit: Payer: Self-pay

## 2023-03-16 ENCOUNTER — Other Ambulatory Visit: Payer: 59

## 2023-03-16 ENCOUNTER — Encounter: Payer: Self-pay | Admitting: Dietician

## 2023-03-16 ENCOUNTER — Ambulatory Visit: Payer: 59

## 2023-03-16 ENCOUNTER — Telehealth: Payer: Self-pay | Admitting: Internal Medicine

## 2023-03-16 ENCOUNTER — Ambulatory Visit: Payer: 59 | Admitting: Internal Medicine

## 2023-03-16 DIAGNOSIS — Z510 Encounter for antineoplastic radiation therapy: Secondary | ICD-10-CM | POA: Diagnosis not present

## 2023-03-16 LAB — RAD ONC ARIA SESSION SUMMARY
Course Elapsed Days: 56
Plan Fractions Treated to Date: 32
Plan Prescribed Dose Per Fraction: 1.8 Gy
Plan Total Fractions Prescribed: 33
Plan Total Prescribed Dose: 59.4 Gy
Reference Point Dosage Given to Date: 57.6 Gy
Reference Point Session Dosage Given: 1.8 Gy
Session Number: 32

## 2023-03-16 NOTE — Telephone Encounter (Signed)
Rescheduled patient's appointment time per providers request. Patient is NOTIFIED of new appointment time.

## 2023-03-16 NOTE — Progress Notes (Signed)
Patient provided one case Ensure Plus on 4/1

## 2023-03-17 ENCOUNTER — Inpatient Hospital Stay: Payer: 59 | Admitting: Internal Medicine

## 2023-03-17 ENCOUNTER — Other Ambulatory Visit: Payer: Self-pay

## 2023-03-17 ENCOUNTER — Ambulatory Visit
Admission: RE | Admit: 2023-03-17 | Discharge: 2023-03-17 | Disposition: A | Discharge status: Home / Self Care | Payer: 59 | Source: Ambulatory Visit | Attending: Radiation Oncology | Admitting: Radiation Oncology

## 2023-03-17 ENCOUNTER — Encounter: Payer: Self-pay | Admitting: Internal Medicine

## 2023-03-17 ENCOUNTER — Inpatient Hospital Stay: Payer: 59

## 2023-03-17 ENCOUNTER — Telehealth: Payer: Self-pay | Admitting: Medical Oncology

## 2023-03-17 ENCOUNTER — Encounter: Payer: Self-pay | Admitting: Radiation Oncology

## 2023-03-17 DIAGNOSIS — C3490 Malignant neoplasm of unspecified part of unspecified bronchus or lung: Secondary | ICD-10-CM

## 2023-03-17 DIAGNOSIS — Z510 Encounter for antineoplastic radiation therapy: Secondary | ICD-10-CM | POA: Diagnosis not present

## 2023-03-17 LAB — RAD ONC ARIA SESSION SUMMARY
Course Elapsed Days: 57
Plan Fractions Treated to Date: 33
Plan Prescribed Dose Per Fraction: 1.8 Gy
Plan Total Fractions Prescribed: 33
Plan Total Prescribed Dose: 59.4 Gy
Reference Point Dosage Given to Date: 59.4 Gy
Reference Point Session Dosage Given: 1.8 Gy
Session Number: 33

## 2023-03-17 NOTE — Telephone Encounter (Signed)
Appt no show.-Pt said he saw a provider in radiation and said he was told he was done and can go home.  Schedule message sent to see Julien Nordmann , labs and scans in one month.

## 2023-03-18 NOTE — Progress Notes (Signed)
  Radiation Oncology         [phone] ________________________________  Name: Richard [name] MRN: [id number]  Date: 03/17/2023  DOB: May 20, 1971  End of Treatment Note  Diagnosis:   Recurrent Progressive Stage IIIB, lung cancer with neuroendocrine features of the right upper lobe overlapping into the right lower lobe.   Indication for treatment: Curative       Radiation treatment dates:   01/19/23-03/17/23  Site/dose:   The patient was treated to the disease within the right lung initially to a dose of 59.4 Gy using IMRT technique in 33 fractions.   Narrative: The patient tolerated radiation treatment relatively well.   Plan: The patient will receive a call in about one month from the radiation oncology department. He will continue follow up with Dr. Julien Nordmann as well.     Carola Rhine, PAC

## 2023-03-25 ENCOUNTER — Encounter: Payer: Self-pay | Admitting: Internal Medicine

## 2023-03-25 ENCOUNTER — Other Ambulatory Visit: Payer: Self-pay

## 2023-03-25 ENCOUNTER — Emergency Department (HOSPITAL_COMMUNITY)
Admission: EM | Admit: 2023-03-25 | Discharge: 2023-03-25 | Disposition: A | Discharge status: Home / Self Care | Payer: 59 | Attending: Emergency Medicine | Admitting: Emergency Medicine

## 2023-03-25 DIAGNOSIS — I10 Essential (primary) hypertension: Secondary | ICD-10-CM | POA: Diagnosis not present

## 2023-03-25 DIAGNOSIS — Z79899 Other long term (current) drug therapy: Secondary | ICD-10-CM | POA: Insufficient documentation

## 2023-03-25 DIAGNOSIS — Z85118 Personal history of other malignant neoplasm of bronchus and lung: Secondary | ICD-10-CM | POA: Insufficient documentation

## 2023-03-25 DIAGNOSIS — R0602 Shortness of breath: Secondary | ICD-10-CM | POA: Insufficient documentation

## 2023-03-25 DIAGNOSIS — J45909 Unspecified asthma, uncomplicated: Secondary | ICD-10-CM | POA: Insufficient documentation

## 2023-03-25 DIAGNOSIS — Z5329 Procedure and treatment not carried out because of patient's decision for other reasons: Secondary | ICD-10-CM | POA: Diagnosis not present

## 2023-03-25 MED ORDER — ALBUTEROL SULFATE HFA 108 (90 BASE) MCG/ACT IN AERS
2.0000 | INHALATION_SPRAY | Freq: Once | RESPIRATORY_TRACT | Status: AC
  Administered 2023-03-25: 2 via RESPIRATORY_TRACT
  Filled 2023-03-25: qty 6.7

## 2023-03-25 NOTE — ED Triage Notes (Signed)
Pt present to ED from home with c/o shortness of breath. Pt states onset of headache yesterday, states that when he gets overheated, he become short of breath. Pt increased continuous oxygen to 5L/min. Pt states to feeling better, refused EKG. Pt A&Ox4 at this time.

## 2023-03-25 NOTE — ED Notes (Signed)
Pt encouraged to do EKG due to care criteria. Patient refused. RN and MD are aware.

## 2023-03-25 NOTE — ED Provider Notes (Signed)
[place] [facility] Provider Note   CSN: [id number] Arrival date & time: 03/25/23  1548     [facility]  Chief Complaint  Patient presents with   Shortness of Breath    Richard [name] is a 52 y.o. male.  Patient here with shortness of breath, headache.  His symptoms have now resolved.  He is chronically on 4 to 5 L of oxygen.  History of lung cancer.  Not undergoing chemotherapy at this time.  He has refused EKG.  He states that he felt like his oxygen tank at home was not working well.  He was getting short of breath but on the way here he is feeling much better.  He would like to go home.  He denies any fevers or chills.  He has no headache anymore.  No chest pain.  His shortness of breath is now feeling better.  Denies any nausea vomiting diarrhea.  The history is provided by the patient.       Home Medications Prior to Admission medications   Medication Sig Start Date End Date Taking? Authorizing Provider  albuterol (VENTOLIN HFA) 108 (90 Base) MCG/ACT inhaler Inhale 1-2 puffs into the lungs every 6 (six) hours as needed for wheezing or shortness of breath. 06/03/22   Parrett, Virgel Bouquetammy S, NP  [name] 160-4.5 MCG/ACT inhaler Inhale 2 puffs into the lungs daily. 12/22/22   [provider]  cloNIDine (CATAPRES) 0.3 MG tablet Take 1 tablet (0.3 mg total) by mouth 2 (two) times daily. Patient not taking: Reported on 01/15/2023 02/11/22   Albertine GratesXu, Fang, MD  diclofenac Sodium (VOLTAREN) 1 % GEL Apply 4 g topically 4 (four) times daily. Patient not taking: Reported on 01/15/2023 01/12/23   Marrianne MoodMcLendon, Michael, MD  levETIRAcetam (KEPPRA) 500 MG tablet Take 1 tablet (500 mg total) by mouth 2 (two) times daily. 01/17/23   Marrianne MoodMcLendon, Michael, MD  losartan (COZAAR) 100 MG tablet Take 100 mg by mouth daily. 12/31/22   [provider]  OXYGEN Inhale 2 L/min into the lungs daily as needed (for shortness of breath).    [provider]  prochlorperazine  (COMPAZINE) 10 MG tablet Take 1 tablet (10 mg total) by mouth every 6 (six) hours as needed for nausea or vomiting. 01/20/23   Si GaulMohamed, Mohamed, MD      Allergies    Dilaudid [hydromorphone hcl] and Tape    Review of Systems   Review of Systems  Physical Exam Updated Vital Signs BP (!) 179/123 (BP Location: Right Arm)   Pulse 84   Temp 97.7 F (36.5 C) (Oral)   Resp 20   Ht 5\' 10"  (1.778 m)   Wt 91 kg   SpO2 100%   BMI 28.79 kg/m  Physical Exam Vitals and nursing note reviewed.  Constitutional:      General: He is not in acute distress.    Appearance: He is well-developed. He is not ill-appearing.  HENT:     Head: Normocephalic and atraumatic.     Mouth/Throat:     Mouth: Mucous membranes are moist.  Eyes:     Conjunctiva/sclera: Conjunctivae normal.     Pupils: Pupils are equal, round, and reactive to light.  Cardiovascular:     Rate and Rhythm: Normal rate and regular rhythm.     Pulses: Normal pulses.     Heart sounds: Normal heart sounds. No murmur heard. Pulmonary:     Effort: Pulmonary effort is normal. No respiratory distress.  Breath sounds: Normal breath sounds.  Abdominal:     Palpations: Abdomen is soft.     Tenderness: There is no abdominal tenderness.  Musculoskeletal:        General: No swelling.     Cervical back: Neck supple.  Skin:    General: Skin is warm and dry.     Capillary Refill: Capillary refill takes less than 2 seconds.  Neurological:     General: No focal deficit present.     Mental Status: He is alert.  Psychiatric:        Mood and Affect: Mood normal.     ED Results / Procedures / Treatments   Labs (all labs ordered are listed, but only abnormal results are displayed) Labs Reviewed - No data to display  EKG None  Radiology No results found.  Procedures Procedures    Medications Ordered in ED Medications  albuterol (VENTOLIN HFA) 108 (90 Base) MCG/ACT inhaler 2 puff (has no administration in time range)    ED  Course/ Medical Decision Making/ A&P                             Medical Decision Making Risk Prescription drug management.   Davyd Rodeffer is here with shortness of breath and headache.  History of hypertension, asthma, chronically on oxygen 5 L.  History of lung cancer.  Patient arrives hypertensive but otherwise normal vitals.  He is on his home oxygen.  He was having episodes of shortness of breath this morning.  He felt like he was not getting oxygen from his canister at home.  He is feeling much better now and she does not want any further workup.  He is fairly asymptomatic but he is got multiple risk factors.  I am concerned maybe there is some sort of pneumonia or asthma exacerbation.  Seems less likely to be clot.  I had extensive conversation with him about doing a workup including basic blood work, troponin, EKG, head CT chest x-ray but ultimately he declined.  He has decision making capacity and understands the risks and benefits.  Ultimately he decided to leave AGAINST MEDICAL ADVICE.  I gave him an inhaler to have for home.  He understands to return if he changes his mind.  This chart was dictated using voice recognition software.  Despite best efforts to proofread,  errors can occur which can change the documentation meaning.         Final Clinical Impression(s) / ED Diagnoses Final diagnoses:  Shortness of breath    Rx / DC Orders ED Discharge Orders     None         Virgina Norfolk, DO 03/25/23 1644

## 2023-04-09 NOTE — Progress Notes (Signed)
NN was able to reach pt this morning to see how he was doing and to remind him that he has upcoming appts for imaging and with Dr.Mohamed to see how well he responded to treatment and what the plan will be going forward.  NN informs pt that he has labs and a CT scheduled for Monday 5/6, and a follow up with Dr Arbutus Ped on 5/8. Pt states he can drive himself to the appts on 5/6, however will need transportation arranged for the appt on 5/8. NN verbalizes understanding. NN states the transportation will be arranged for his appt on 5/8, and that he will receive a phone call on 5/3 to remind him of his appts on 5/6. Pt verbalized understanding.

## 2023-04-12 ENCOUNTER — Encounter: Payer: Self-pay | Admitting: Internal Medicine

## 2023-04-12 NOTE — Progress Notes (Signed)
NN reached out to pt to verify why he would need transportation services for his f/u appt on 5/8 with Dr.Mohamed. Pt states he "might not have a car". NN reminded pt that per our conversation on 4/26,  he would be able to get himself to his lab and CT appt on 5/6. Pt then stated that he would need transportation on 5/6 as well. NN then asked pt to clarify that he does need transportation arranged for both days, 5/6 and 5/8. Pt then states "I'll get there", and declined transportation assistance. Email sent to [employer], Present[employer], broadcasting.

## 2023-04-16 NOTE — Progress Notes (Signed)
NN reached out to pt to remind him of his upcoming appts on 5/6 and 5/8, and to ensure he still had means of transportation to his appts, as earlier this week he said he didn't need our transportation coordinator to assist with providing him a ride to his appointments.  NN was unable to reach pt or his wife. Unable to leave VM at either number.

## 2023-04-19 ENCOUNTER — Other Ambulatory Visit: Payer: Self-pay

## 2023-04-19 ENCOUNTER — Inpatient Hospital Stay: Payer: 59 | Attending: Internal Medicine

## 2023-04-19 ENCOUNTER — Ambulatory Visit (HOSPITAL_COMMUNITY)
Admission: RE | Admit: 2023-04-19 | Discharge: 2023-04-19 | Disposition: A | Discharge status: Home / Self Care | Payer: 59 | Source: Ambulatory Visit | Attending: Internal Medicine | Admitting: Internal Medicine

## 2023-04-19 DIAGNOSIS — Z5112 Encounter for antineoplastic immunotherapy: Secondary | ICD-10-CM | POA: Insufficient documentation

## 2023-04-19 DIAGNOSIS — C7B8 Other secondary neuroendocrine tumors: Secondary | ICD-10-CM | POA: Insufficient documentation

## 2023-04-19 DIAGNOSIS — C3490 Malignant neoplasm of unspecified part of unspecified bronchus or lung: Secondary | ICD-10-CM | POA: Insufficient documentation

## 2023-04-19 DIAGNOSIS — Z79620 Long term (current) use of immunosuppressive biologic: Secondary | ICD-10-CM | POA: Insufficient documentation

## 2023-04-19 DIAGNOSIS — C7A8 Other malignant neuroendocrine tumors: Secondary | ICD-10-CM | POA: Insufficient documentation

## 2023-04-19 DIAGNOSIS — C3431 Malignant neoplasm of lower lobe, right bronchus or lung: Secondary | ICD-10-CM

## 2023-04-19 LAB — CBC WITH DIFFERENTIAL (CANCER CENTER ONLY)
Abs Immature Granulocytes: 0.03 10*3/uL (ref 0.00–0.07)
Basophils Absolute: 0 10*3/uL (ref 0.0–0.1)
Basophils Relative: 0 %
Eosinophils Absolute: 0 10*3/uL (ref 0.0–0.5)
Eosinophils Relative: 0 %
HCT: 48.4 % (ref 39.0–52.0)
Hemoglobin: 16.1 g/dL (ref 13.0–17.0)
Immature Granulocytes: 1 %
Lymphocytes Relative: 7 %
Lymphs Abs: 0.3 10*3/uL — ABNORMAL LOW (ref 0.7–4.0)
MCH: 32.5 pg (ref 26.0–34.0)
MCHC: 33.3 g/dL (ref 30.0–36.0)
MCV: 97.8 fL (ref 80.0–100.0)
Monocytes Absolute: 0.7 10*3/uL (ref 0.1–1.0)
Monocytes Relative: 15 %
Neutro Abs: 3.6 10*3/uL (ref 1.7–7.7)
Neutrophils Relative %: 77 %
Platelet Count: 233 10*3/uL (ref 150–400)
RBC: 4.95 MIL/uL (ref 4.22–5.81)
RDW: 15 % (ref 11.5–15.5)
WBC Count: 4.6 10*3/uL (ref 4.0–10.5)
nRBC: 0 % (ref 0.0–0.2)

## 2023-04-19 LAB — CMP (CANCER CENTER ONLY)
ALT: 9 U/L (ref 0–44)
AST: 12 U/L — ABNORMAL LOW (ref 15–41)
Albumin: 4 g/dL (ref 3.5–5.0)
Alkaline Phosphatase: 69 U/L (ref 38–126)
Anion gap: 6 (ref 5–15)
BUN: 12 mg/dL (ref 6–20)
CO2: 27 mmol/L (ref 22–32)
Calcium: 9.2 mg/dL (ref 8.9–10.3)
Chloride: 105 mmol/L (ref 98–111)
Creatinine: 0.84 mg/dL (ref 0.61–1.24)
GFR, Estimated: >60 mL/min (ref >60)
Glucose, Bld: 77 mg/dL (ref 70–99)
Potassium: 3.9 mmol/L (ref 3.5–5.1)
Sodium: 138 mmol/L (ref 135–145)
Total Bilirubin: 0.3 mg/dL (ref 0.3–1.2)
Total Protein: 7.3 g/dL (ref 6.5–8.1)

## 2023-04-19 MED ORDER — SODIUM CHLORIDE (PF) 0.9 % IJ SOLN
INTRAMUSCULAR | Status: AC
  Filled 2023-04-19: qty 50

## 2023-04-19 MED ORDER — IOHEXOL 300 MG/ML  SOLN
75.0000 mL | Freq: Once | INTRAMUSCULAR | Status: AC | PRN
  Administered 2023-04-19: 75 mL via INTRAVENOUS

## 2023-04-21 ENCOUNTER — Encounter: Payer: Self-pay | Admitting: Internal Medicine

## 2023-04-21 ENCOUNTER — Inpatient Hospital Stay (HOSPITAL_BASED_OUTPATIENT_CLINIC_OR_DEPARTMENT_OTHER): Payer: 59 | Admitting: Internal Medicine

## 2023-04-21 VITALS — BP 122/81 | HR 94 | Temp 97.8°F | Resp 17 | Wt 194.0 lb

## 2023-04-21 DIAGNOSIS — C3431 Malignant neoplasm of lower lobe, right bronchus or lung: Secondary | ICD-10-CM

## 2023-04-21 DIAGNOSIS — C7A8 Other malignant neuroendocrine tumors: Secondary | ICD-10-CM | POA: Diagnosis not present

## 2023-04-21 DIAGNOSIS — Z79620 Long term (current) use of immunosuppressive biologic: Secondary | ICD-10-CM | POA: Diagnosis not present

## 2023-04-21 DIAGNOSIS — Z5112 Encounter for antineoplastic immunotherapy: Secondary | ICD-10-CM | POA: Diagnosis present

## 2023-04-21 DIAGNOSIS — C7B8 Other secondary neuroendocrine tumors: Secondary | ICD-10-CM | POA: Diagnosis not present

## 2023-04-21 NOTE — Progress Notes (Signed)
[facility] [phone]   [phone]  OFFICE PROGRESS NOTE  Medicine, [facility] [address] [place] [address]  DIAGNOSIS: stage IIIb (T4, N2, M0) non-small cell lung cancer, poorly differentiated carcinoma with neuroendocrine features diagnosed in January 2024 and presented with large right lower lobe lung mass in addition to right hilar and mediastinal lymphadenopathy.  PRIOR THERAPY:   1) status post short course of palliative radiotherapy with 30 Gray completed in March 2023.  2) A course of concurrent chemoradiation with weekly carboplatin for AUC of 2 and paclitaxel 45 Mg/M2.  Status post 4 cycles.  CURRENT THERAPY: Consolidation treatment with immunotherapy with Imfinzi 1500 Mg IV every 4 weeks.  First dose 04/29/2023.  INTERVAL HISTORY: Richard [name] 52 y.o. male returns to the clinic today for follow-up visit.  The patient is feeling fine today with no concerning complaints except for the baseline shortness of breath.  He tolerated the previous course of concurrent chemoradiation fairly well but he was noncompliant with his treatment with the chemotherapy and he missed several treatments.  He has no current chest pain has mild cough with no hemoptysis.  He has no nausea, vomiting, diarrhea or constipation.  He has no headache or visual changes.  He has no recent weight loss or night sweats.  He had repeat CT scan of the chest performed recently and he is here for evaluation and discussion of his scan results.   MEDICAL HISTORY: Past Medical History:  Diagnosis Date   Asthma    Brain bleed (HCC)    DVT (deep venous thrombosis) (HCC) 02/19/2015   RLE   GERD (gastroesophageal reflux disease)    History of home oxygen therapy    Hypertension    Seizures (HCC)    last episode 03/2013   Stroke [facility])     ALLERGIES:  is allergic to dilaudid [hydromorphone hcl] and tape.  MEDICATIONS:  Current Outpatient Medications   Medication Sig Dispense Refill   albuterol (VENTOLIN HFA) 108 (90 Base) MCG/ACT inhaler Inhale 1-2 puffs into the lungs every 6 (six) hours as needed for wheezing or shortness of breath. 8 g 3   [name] 160-4.5 MCG/ACT inhaler Inhale 2 puffs into the lungs daily.     cloNIDine (CATAPRES) 0.3 MG tablet Take 1 tablet (0.3 mg total) by mouth 2 (two) times daily. (Patient not taking: Reported on 01/15/2023) 60 tablet 0   diclofenac Sodium (VOLTAREN) 1 % GEL Apply 4 g topically 4 (four) times daily. (Patient not taking: Reported on 01/15/2023)     levETIRAcetam (KEPPRA) 500 MG tablet Take 1 tablet (500 mg total) by mouth 2 (two) times daily.     losartan (COZAAR) 100 MG tablet Take 100 mg by mouth daily.     OXYGEN Inhale 2 L/min into the lungs daily as needed (for shortness of breath).     prochlorperazine (COMPAZINE) 10 MG tablet Take 1 tablet (10 mg total) by mouth every 6 (six) hours as needed for nausea or vomiting. 30 tablet 0   No current facility-administered medications for this visit.    SURGICAL HISTORY:  Past Surgical History:  Procedure Laterality Date   BRONCHIAL BIOPSY  02/10/2022   Procedure: BRONCHIAL BIOPSIES;  Surgeon: Oretha Milch, MD;  Location: [facility];  Service: Cardiopulmonary;;   BRONCHIAL BRUSHINGS  02/10/2022   Procedure: BRONCHIAL BRUSHINGS;  Surgeon: Oretha Milch, MD;  Location: [facility];  Service: Cardiopulmonary;;   BRONCHIAL NEEDLE ASPIRATION BIOPSY  02/10/2022   Procedure: BRONCHIAL NEEDLE ASPIRATION BIOPSIES;  Surgeon: Oretha Milch, MD;  Location: [facility];  Service: Cardiopulmonary;;   BRONCHIAL WASHINGS  02/10/2022   Procedure: BRONCHIAL WASHINGS;  Surgeon: Oretha Milch, MD;  Location: [facility];  Service: Cardiopulmonary;;   ENDOBRONCHIAL ULTRASOUND Bilateral 02/10/2022   Procedure: ENDOBRONCHIAL ULTRASOUND;  Surgeon: Oretha Milch, MD;  Location: [facility];  Service: Cardiopulmonary;  Laterality: Bilateral;   LEG SURGERY     VIDEO  BRONCHOSCOPY  02/10/2022   Procedure: VIDEO BRONCHOSCOPY WITHOUT FLUORO;  Surgeon: Oretha Milch, MD;  Location: [facility];  Service: Cardiopulmonary;;    REVIEW OF SYSTEMS:  Constitutional: positive for fatigue Eyes: negative Ears, nose, mouth, throat, and face: negative Respiratory: positive for cough and dyspnea on exertion Cardiovascular: negative Gastrointestinal: negative Genitourinary:negative Integument/breast: negative Hematologic/lymphatic: negative Musculoskeletal:negative Neurological: negative Behavioral/Psych: negative Endocrine: negative Allergic/Immunologic: negative   PHYSICAL EXAMINATION: General appearance: alert, cooperative, fatigued, and no distress Head: Normocephalic, without obvious abnormality, atraumatic Neck: no adenopathy, no JVD, supple, symmetrical, trachea midline, and thyroid not enlarged, symmetric, no tenderness/mass/nodules Lymph nodes: Cervical, supraclavicular, and axillary nodes normal. Resp: clear to auscultation bilaterally Back: symmetric, no curvature. ROM normal. No CVA tenderness. Cardio: regular rate and rhythm, S1, S2 normal, no murmur, click, rub or gallop GI: soft, non-tender; bowel sounds normal; no masses,  no organomegaly Extremities: extremities normal, atraumatic, no cyanosis or edema Neurologic: Alert and oriented X 3, normal strength and tone. Normal symmetric reflexes. Normal coordination and gait  ECOG PERFORMANCE STATUS: 1 - Symptomatic but completely ambulatory  Blood pressure 122/81, pulse 94, temperature 97.8 F (36.6 C), temperature source Oral, resp. rate 17, weight 194 lb (88 kg), SpO2 90 %.  LABORATORY DATA: Lab Results  Component Value Date   WBC 4.6 04/19/2023   HGB 16.1 04/19/2023   HCT 48.4 04/19/2023   MCV 97.8 04/19/2023   PLT 233 04/19/2023      Chemistry      Component Value Date/Time   NA 138 04/19/2023 1031   K 3.9 04/19/2023 1031   CL 105 04/19/2023 1031   CO2 27 04/19/2023 1031   BUN  12 04/19/2023 1031   CREATININE 0.84 04/19/2023 1031   CREATININE 0.91 12/05/2013 1605      Component Value Date/Time   CALCIUM 9.2 04/19/2023 1031   ALKPHOS 69 04/19/2023 1031   AST 12 (L) 04/19/2023 1031   ALT 9 04/19/2023 1031   BILITOT 0.3 04/19/2023 1031       RADIOGRAPHIC STUDIES: CT Chest W Contrast  Result Date: 04/21/2023 CLINICAL DATA:  Non-small cell lung cancer; * Tracking Code: BO * EXAM: CT CHEST WITH CONTRAST TECHNIQUE: Multidetector CT imaging of the chest was performed during intravenous contrast administration. RADIATION DOSE REDUCTION: This exam was performed according to the departmental dose-optimization program which includes automated exposure control, adjustment of the mA and/or kV according to patient size and/or use of iterative reconstruction technique. CONTRAST:  75mL OMNIPAQUE IOHEXOL 300 MG/ML  SOLN COMPARISON:  Chest CT dated January 15, 2019 FINDINGS: Cardiovascular: Normal heart size. Trace pericardial effusion. Stable mildly dilated ascending thoracic aorta, measuring up to 4.0 cm. Mediastinum/Nodes: Esophagus and thyroid are unremarkable. Ill-defined soft tissue extending into the right hilum and mediastinum. Interval decreased size of AP window lymph node measuring 6 mm on series 2, image 58, previously 12 mm. Other smaller prominent mediastinal and right hilar lymph nodes are unchanged in size. Lungs/Pleura: Central airways are patent. Mild centrilobular emphysema. Decreased size of retrohilar mass involving  the posterior right upper lobe and superior portion of the right lower lobe, measures 5.2 x 3.0 cm, previously 7.7 x 4.1 cm. Irregular right upper lobe nodule measuring 8.9 mm on series 5, image 52, unchanged when compared with the prior exam additional scattered small solid pulmonary nodules measuring less than 4 mm are stable. Reference nodule of the right upper lobe measuring 3 mm on series 5, image 43. Pleural effusion. Upper Abdomen: Thickening of the  left adrenal gland, unchanged when compared with the prior exam. No acute abnormality. Musculoskeletal: No aggressive appearing osseous lesions. IMPRESSION: 1. Decreased size of right upper lung retrohilar mass. 2. Interval decreased size of AP window lymph node. 3. Stable irregular right upper lobe pulmonary nodule. 4. Stable mildly dilated ascending thoracic aorta, measuring up to 4.0 cm. Recommend annual imaging followup by CTA or MRA. This recommendation follows 2010 ACCF/AHA/AATS/ACR/ASA/SCA/SCAI/SIR/STS/SVM Guidelines for the Diagnosis and Management of Patients with Thoracic Aortic Disease. Circulation. 2010; 121: Z610-R604. Aortic aneurysm NOS (ICD10-I71.9) 5. Mild emphysema (ICD10-J43.9). Electronically Signed   By: Allegra Lai M.D.   On: 04/21/2023 09:04    ASSESSMENT AND PLAN: This is a very pleasant 52 years old African-American male with stage IIIb (T4, N2, M0) non-small cell lung cancer, poorly differentiated carcinoma with neuroendocrine features diagnosed in January 2024 and presented with large right lower lobe lung mass in addition to right hilar and mediastinal lymphadenopathy.  He is status post short course of palliative radiotherapy with 30 Wallace Cullens completed in March 2023. He is currently on concurrent chemoradiation with weekly carboplatin for AUC of 2 1 paclitaxel 45 Mg/M2 status post 4 cycles.  He was noncompliant with his chemotherapy treatments and received only 3 out of the schedule 7 cycles.  Last cycle was given on March 15, 2023. The patient is feeling much better today with no concerning complaints except for mild shortness of breath with exertion. He had repeat CT scan of the chest performed recently.  I personally and independently reviewed the scan and discussed the result with the patient today. His scan showed decrease in the size of the right upper lung retrohilar mass as well as decrease in the size of the AP window lymph nodes and no evidence for progression. I had a  lengthy discussion with the patient today about his scan results and future treatment options. I recommended for the patient treatment with consolidation immunotherapy with Imfinzi 1500 Mg IV every 4 weeks.  I discussed with the patient the adverse effect of the immunotherapy including but not limited to immunotherapy mediated skin rash, diarrhea, inflammation of the lung, kidney, liver, thyroid or other endocrine dysfunction. The patient is interested in the treatment but I strongly encouraged him to be compliant with his treatment and if he keep missing appointments, I will discontinue his treatment with immunotherapy. He is expected to start the first cycle of this treatment next week. I will see him back for follow-up visit in 5 weeks for evaluation before starting cycle #2. He was advised to call immediately if he has any other concerning symptoms in the interval. The patient voices understanding of current disease status and treatment options and is in agreement with the current care plan.  All questions were answered. The patient knows to call the clinic with any problems, questions or concerns. We can certainly see the patient much sooner if necessary.  The total time spent in the appointment was 30 minutes.  Disclaimer: This note was dictated with voice recognition software. Similar sounding words  can inadvertently be transcribed and may not be corrected upon review.

## 2023-04-21 NOTE — Progress Notes (Signed)
DISCONTINUE ON PATHWAY REGIMEN - Non-Small Cell Lung     A cycle is every 7 days, concurrent with RT:     Paclitaxel      Carboplatin   **Always confirm dose/schedule in your pharmacy ordering system**  REASON: Continuation Of Treatment PRIOR TREATMENT: [id number]: Carboplatin AUC=2 + Paclitaxel 45 mg/m2 Weekly During Radiation TREATMENT RESPONSE: Partial Response (PR)  START ON PATHWAY REGIMEN - Non-Small Cell Lung     A cycle is every 28 days:     Durvalumab   **Always confirm dose/schedule in your pharmacy ordering system**  Patient Characteristics: Preoperative or Nonsurgical Candidate (Clinical Staging), Stage III - Nonsurgical Candidate (Nonsquamous and Squamous), PS = 0, 1 Therapeutic Status: Preoperative or Nonsurgical Candidate (Clinical Staging) AJCC T Category: cT4 AJCC N Category: cN2 AJCC M Category: cM0 AJCC 8 Stage Grouping: IIIB ECOG Performance Status: 1 Intent of Therapy: Curative Intent, Discussed with Patient 

## 2023-04-22 ENCOUNTER — Other Ambulatory Visit: Payer: Self-pay

## 2023-04-22 NOTE — Progress Notes (Signed)
Pharmacist Chemotherapy Monitoring - Initial Assessment    Anticipated start date: 04/29/23   The following has been reviewed per standard work regarding the patient's treatment regimen: The patient's diagnosis, treatment plan and drug doses, and organ/hematologic function Lab orders and baseline tests specific to treatment regimen  The treatment plan start date, drug sequencing, and pre-medications Prior authorization status  Patient's documented medication list, including drug-drug interaction screen and prescriptions for anti-emetics and supportive care specific to the treatment regimen The drug concentrations, fluid compatibility, administration routes, and timing of the medications to be used The patient's access for treatment and lifetime cumulative dose history, if applicable  The patient's medication allergies and previous infusion related reactions, if applicable   Changes made to treatment plan:  N/A  Follow up needed:  N/A   Ebony Hail, Pharm.D., CPP 04/22/2023@11 :36 AM

## 2023-04-23 ENCOUNTER — Telehealth: Payer: Self-pay | Admitting: Internal Medicine

## 2023-04-23 NOTE — Telephone Encounter (Signed)
Scheduled per 05/08 los, patient has been called and voicemail was left.

## 2023-04-28 ENCOUNTER — Other Ambulatory Visit: Payer: Self-pay

## 2023-04-28 NOTE — Progress Notes (Signed)
NN reached out to pt to remind him of his lab and infusion appts tomorrow. Call went straight to VM. LVM requesting a return call to verify receipt of VM and awareness of his appts.

## 2023-04-29 ENCOUNTER — Inpatient Hospital Stay: Payer: 59

## 2023-04-29 ENCOUNTER — Other Ambulatory Visit: Payer: Self-pay

## 2023-04-29 ENCOUNTER — Telehealth: Payer: Self-pay

## 2023-04-29 VITALS — BP 145/98 | HR 93 | Temp 97.7°F | Resp 18 | Wt 205.8 lb

## 2023-04-29 DIAGNOSIS — Z5112 Encounter for antineoplastic immunotherapy: Secondary | ICD-10-CM | POA: Diagnosis not present

## 2023-04-29 DIAGNOSIS — C3431 Malignant neoplasm of lower lobe, right bronchus or lung: Secondary | ICD-10-CM

## 2023-04-29 LAB — CMP (CANCER CENTER ONLY)
ALT: 9 U/L (ref 0–44)
AST: 12 U/L — ABNORMAL LOW (ref 15–41)
Albumin: 3.6 g/dL (ref 3.5–5.0)
Alkaline Phosphatase: 83 U/L (ref 38–126)
Anion gap: 6 (ref 5–15)
BUN: 11 mg/dL (ref 6–20)
CO2: 24 mmol/L (ref 22–32)
Calcium: 8.5 mg/dL — ABNORMAL LOW (ref 8.9–10.3)
Chloride: 113 mmol/L — ABNORMAL HIGH (ref 98–111)
Creatinine: 0.85 mg/dL (ref 0.61–1.24)
GFR, Estimated: >60 mL/min (ref >60)
Glucose, Bld: 103 mg/dL — ABNORMAL HIGH (ref 70–99)
Potassium: 3.9 mmol/L (ref 3.5–5.1)
Sodium: 143 mmol/L (ref 135–145)
Total Bilirubin: 0.4 mg/dL (ref 0.3–1.2)
Total Protein: 6.6 g/dL (ref 6.5–8.1)

## 2023-04-29 LAB — CBC WITH DIFFERENTIAL (CANCER CENTER ONLY)
Abs Immature Granulocytes: 0.03 10*3/uL (ref 0.00–0.07)
Basophils Absolute: 0 10*3/uL (ref 0.0–0.1)
Basophils Relative: 0 %
Eosinophils Absolute: 0 10*3/uL (ref 0.0–0.5)
Eosinophils Relative: 0 %
HCT: 41.7 % (ref 39.0–52.0)
Hemoglobin: 13.8 g/dL (ref 13.0–17.0)
Immature Granulocytes: 1 %
Lymphocytes Relative: 6 %
Lymphs Abs: 0.4 10*3/uL — ABNORMAL LOW (ref 0.7–4.0)
MCH: 33 pg (ref 26.0–34.0)
MCHC: 33.1 g/dL (ref 30.0–36.0)
MCV: 99.8 fL (ref 80.0–100.0)
Monocytes Absolute: 0.7 10*3/uL (ref 0.1–1.0)
Monocytes Relative: 11 %
Neutro Abs: 5.3 10*3/uL (ref 1.7–7.7)
Neutrophils Relative %: 82 %
Platelet Count: 208 10*3/uL (ref 150–400)
RBC: 4.18 MIL/uL — ABNORMAL LOW (ref 4.22–5.81)
RDW: 15.5 % (ref 11.5–15.5)
WBC Count: 6.4 10*3/uL (ref 4.0–10.5)
nRBC: 0 % (ref 0.0–0.2)

## 2023-04-29 LAB — TSH: TSH: 1.578 u[IU]/mL (ref 0.350–4.500)

## 2023-04-29 MED ORDER — SODIUM CHLORIDE 0.9 % IV SOLN
1500.0000 mg | Freq: Once | INTRAVENOUS | Status: AC
  Administered 2023-04-29: 1500 mg via INTRAVENOUS
  Filled 2023-04-29: qty 30

## 2023-04-29 MED ORDER — SODIUM CHLORIDE 0.9 % IV SOLN: Freq: Once | INTRAVENOUS | Status: AC

## 2023-04-29 NOTE — Patient Instructions (Signed)
[facility]   Discharge Instructions: Thank you for choosing [facility] to provide your oncology and hematology care.   If you have a lab appointment with the Cancer Center, please go directly to the Cancer Center and check in at the registration area.   Wear comfortable clothing and clothing appropriate for easy access to any Portacath or PICC line.   We strive to give you quality time with your provider. You may need to reschedule your appointment if you arrive late (15 or more minutes).  Arriving late affects you and other patients whose appointments are after yours.  Also, if you miss three or more appointments without notifying the office, you may be dismissed from the clinic at the provider's discretion.      For prescription refill requests, have your pharmacy contact our office and allow 72 hours for refills to be completed.    Today you received the following chemotherapy and/or immunotherapy agents: Durvalumab (Imfinzi)      To help prevent nausea and vomiting after your treatment, we encourage you to take your nausea medication as directed.  BELOW ARE SYMPTOMS THAT SHOULD BE REPORTED IMMEDIATELY: *FEVER GREATER THAN 100.4 F (38 C) OR HIGHER *CHILLS OR SWEATING *NAUSEA AND VOMITING THAT IS NOT CONTROLLED WITH YOUR NAUSEA MEDICATION *UNUSUAL SHORTNESS OF BREATH *UNUSUAL BRUISING OR BLEEDING *URINARY PROBLEMS (pain or burning when urinating, or frequent urination) *BOWEL PROBLEMS (unusual diarrhea, constipation, pain near the anus) TENDERNESS IN MOUTH AND THROAT WITH OR WITHOUT PRESENCE OF ULCERS (sore throat, sores in mouth, or a toothache) UNUSUAL RASH, SWELLING OR PAIN  UNUSUAL VAGINAL DISCHARGE OR ITCHING   Items with * indicate a potential emergency and should be followed up as soon as possible or go to the Emergency Department if any problems should occur.  Please show the CHEMOTHERAPY ALERT CARD or IMMUNOTHERAPY ALERT  CARD at check-in to the Emergency Department and triage nurse.  Should you have questions after your visit or need to cancel or reschedule your appointment, please contact [facility]  Dept: [phone]  and follow the prompts.  Office hours are 8:00 a.m. to 4:30 p.m. Monday - Friday. Please note that voicemails left after 4:00 p.m. may not be returned until the following business day.  We are closed weekends and major holidays. You have access to a nurse at all times for urgent questions. Please call the main number to the clinic Dept: [phone] and follow the prompts.   For any non-urgent questions, you may also contact your provider using MyChart. We now offer e-Visits for anyone 18 and older to request care online for non-urgent symptoms. For details visit [email or web address].   Also download the MyChart app! Go to the app store, search "MyChart", open the app, select [email or web address], and log in with your MyChart username and password.   

## 2023-04-29 NOTE — Telephone Encounter (Signed)
This nurse reached out to patient to see how he was feeling after his first Imfinizi today.  Patient states he is feeling fine as of this moment.  This nurse advised patient to give [place] a call if he starts to feel bad or develop a fever.  Patient acknowledges understanding.  No further questions or concerns noted at this time.

## 2023-04-29 NOTE — Telephone Encounter (Signed)
-----   Message from Marylouise Stacks, RN sent at 04/29/2023  4:43 PM EDT ----- Regarding: Dr. Arbutus Ped 1st time Imfinzi f/u tol well Dr. Arbutus Ped 1st time Imfinzi to[facility] well. Pt call back due.

## 2023-05-01 LAB — T4: T4, Total: 6.8 ug/dL (ref 4.5–12.0)

## 2023-05-03 ENCOUNTER — Ambulatory Visit
Admission: RE | Admit: 2023-05-03 | Discharge: 2023-05-03 | Disposition: A | Discharge status: Home / Self Care | Payer: 59 | Source: Ambulatory Visit | Attending: Internal Medicine | Admitting: Internal Medicine

## 2023-05-03 DIAGNOSIS — Z8673 Personal history of transient ischemic attack (TIA), and cerebral infarction without residual deficits: Secondary | ICD-10-CM | POA: Insufficient documentation

## 2023-05-03 DIAGNOSIS — F17200 Nicotine dependence, unspecified, uncomplicated: Secondary | ICD-10-CM | POA: Insufficient documentation

## 2023-05-03 DIAGNOSIS — Z510 Encounter for antineoplastic radiation therapy: Secondary | ICD-10-CM | POA: Insufficient documentation

## 2023-05-03 DIAGNOSIS — Z5111 Encounter for antineoplastic chemotherapy: Secondary | ICD-10-CM | POA: Insufficient documentation

## 2023-05-03 DIAGNOSIS — C3431 Malignant neoplasm of lower lobe, right bronchus or lung: Secondary | ICD-10-CM | POA: Insufficient documentation

## 2023-05-03 DIAGNOSIS — Z86718 Personal history of other venous thrombosis and embolism: Secondary | ICD-10-CM | POA: Insufficient documentation

## 2023-05-03 NOTE — Progress Notes (Addendum)
  Radiation Oncology         [phone] ________________________________  Name: Richard [name] MRN: [id number]  Date of Service: 05/03/2023  DOB: 09/13/1971  Post Treatment Telephone Note  Diagnosis:  Recurrent Progressive Stage IIIB, lung cancer with neuroendocrine features of the right upper lobe overlapping into the right lower lobe.    Indication for treatment: Curative        Radiation treatment dates:   01/19/23-03/17/23(as documented in provider EOT note)   The patient was not available for call today. Voicemail left.  The patient has scheduled follow up with his medical oncologist Dr. Arbutus Ped for ongoing care, and was encouraged to call if he develops concerns or questions regarding radiation.    Ruel Favors, LPN

## 2023-05-05 ENCOUNTER — Other Ambulatory Visit: Payer: 59

## 2023-05-05 ENCOUNTER — Ambulatory Visit: Payer: 59 | Admitting: Internal Medicine

## 2023-05-19 ENCOUNTER — Encounter: Payer: Self-pay | Admitting: Internal Medicine

## 2023-05-25 ENCOUNTER — Other Ambulatory Visit: Payer: Self-pay

## 2023-05-26 ENCOUNTER — Inpatient Hospital Stay (HOSPITAL_BASED_OUTPATIENT_CLINIC_OR_DEPARTMENT_OTHER): Payer: 59 | Admitting: Internal Medicine

## 2023-05-26 ENCOUNTER — Other Ambulatory Visit: Payer: Self-pay

## 2023-05-26 ENCOUNTER — Inpatient Hospital Stay: Payer: 59

## 2023-05-26 ENCOUNTER — Inpatient Hospital Stay: Payer: 59 | Attending: Internal Medicine

## 2023-05-26 VITALS — BP 142/103 | HR 99

## 2023-05-26 DIAGNOSIS — C3431 Malignant neoplasm of lower lobe, right bronchus or lung: Secondary | ICD-10-CM

## 2023-05-26 DIAGNOSIS — Z5112 Encounter for antineoplastic immunotherapy: Secondary | ICD-10-CM | POA: Diagnosis not present

## 2023-05-26 DIAGNOSIS — Z79620 Long term (current) use of immunosuppressive biologic: Secondary | ICD-10-CM | POA: Insufficient documentation

## 2023-05-26 LAB — CMP (CANCER CENTER ONLY)
ALT: 6 U/L (ref 0–44)
AST: 12 U/L — ABNORMAL LOW (ref 15–41)
Albumin: 3.8 g/dL (ref 3.5–5.0)
Alkaline Phosphatase: 83 U/L (ref 38–126)
Anion gap: 8 (ref 5–15)
BUN: 10 mg/dL (ref 6–20)
CO2: 24 mmol/L (ref 22–32)
Calcium: 9.3 mg/dL (ref 8.9–10.3)
Chloride: 109 mmol/L (ref 98–111)
Creatinine: 0.74 mg/dL (ref 0.61–1.24)
GFR, Estimated: >60 mL/min (ref >60)
Glucose, Bld: 87 mg/dL (ref 70–99)
Potassium: 4.1 mmol/L (ref 3.5–5.1)
Sodium: 141 mmol/L (ref 135–145)
Total Bilirubin: 0.7 mg/dL (ref 0.3–1.2)
Total Protein: 7.3 g/dL (ref 6.5–8.1)

## 2023-05-26 LAB — CBC WITH DIFFERENTIAL (CANCER CENTER ONLY)
Abs Immature Granulocytes: 0.03 10*3/uL (ref 0.00–0.07)
Basophils Absolute: 0 10*3/uL (ref 0.0–0.1)
Basophils Relative: 1 %
Eosinophils Absolute: 0 10*3/uL (ref 0.0–0.5)
Eosinophils Relative: 0 %
HCT: 47 % (ref 39.0–52.0)
Hemoglobin: 16 g/dL (ref 13.0–17.0)
Immature Granulocytes: 1 %
Lymphocytes Relative: 10 %
Lymphs Abs: 0.5 10*3/uL — ABNORMAL LOW (ref 0.7–4.0)
MCH: 33.7 pg (ref 26.0–34.0)
MCHC: 34 g/dL (ref 30.0–36.0)
MCV: 98.9 fL (ref 80.0–100.0)
Monocytes Absolute: 0.9 10*3/uL (ref 0.1–1.0)
Monocytes Relative: 17 %
Neutro Abs: 4 10*3/uL (ref 1.7–7.7)
Neutrophils Relative %: 71 %
Platelet Count: 232 10*3/uL (ref 150–400)
RBC: 4.75 MIL/uL (ref 4.22–5.81)
RDW: 14.5 % (ref 11.5–15.5)
WBC Count: 5.5 10*3/uL (ref 4.0–10.5)
nRBC: 0 % (ref 0.0–0.2)

## 2023-05-26 MED ORDER — SODIUM CHLORIDE 0.9 % IV SOLN
1500.0000 mg | Freq: Once | INTRAVENOUS | Status: AC
  Administered 2023-05-26: 1500 mg via INTRAVENOUS
  Filled 2023-05-26: qty 30

## 2023-05-26 MED ORDER — SODIUM CHLORIDE 0.9 % IV SOLN: Freq: Once | INTRAVENOUS | Status: AC

## 2023-05-26 NOTE — Patient Instructions (Signed)
[facility]  Discharge Instructions: Thank you for choosing [facility] to provide your oncology and hematology care.   If you have a lab appointment with the Cancer Center, please go directly to the Cancer Center and check in at the registration area.   Wear comfortable clothing and clothing appropriate for easy access to any Portacath or PICC line.   We strive to give you quality time with your provider. You may need to reschedule your appointment if you arrive late (15 or more minutes).  Arriving late affects you and other patients whose appointments are after yours.  Also, if you miss three or more appointments without notifying the office, you may be dismissed from the clinic at the provider's discretion.      For prescription refill requests, have your pharmacy contact our office and allow 72 hours for refills to be completed.    Today you received the following chemotherapy and/or immunotherapy agents: Imfinzi      To help prevent nausea and vomiting after your treatment, we encourage you to take your nausea medication as directed.  BELOW ARE SYMPTOMS THAT SHOULD BE REPORTED IMMEDIATELY: *FEVER GREATER THAN 100.4 F (38 C) OR HIGHER *CHILLS OR SWEATING *NAUSEA AND VOMITING THAT IS NOT CONTROLLED WITH YOUR NAUSEA MEDICATION *UNUSUAL SHORTNESS OF BREATH *UNUSUAL BRUISING OR BLEEDING *URINARY PROBLEMS (pain or burning when urinating, or frequent urination) *BOWEL PROBLEMS (unusual diarrhea, constipation, pain near the anus) TENDERNESS IN MOUTH AND THROAT WITH OR WITHOUT PRESENCE OF ULCERS (sore throat, sores in mouth, or a toothache) UNUSUAL RASH, SWELLING OR PAIN  UNUSUAL VAGINAL DISCHARGE OR ITCHING   Items with * indicate a potential emergency and should be followed up as soon as possible or go to the Emergency Department if any problems should occur.  Please show the CHEMOTHERAPY ALERT CARD or IMMUNOTHERAPY ALERT CARD at  check-in to the Emergency Department and triage nurse.  Should you have questions after your visit or need to cancel or reschedule your appointment, please contact [facility]  Dept: [phone]  and follow the prompts.  Office hours are 8:00 a.m. to 4:30 p.m. Monday - Friday. Please note that voicemails left after 4:00 p.m. may not be returned until the following business day.  We are closed weekends and major holidays. You have access to a nurse at all times for urgent questions. Please call the main number to the clinic Dept: [phone] and follow the prompts.   For any non-urgent questions, you may also contact your provider using MyChart. We now offer e-Visits for anyone 18 and older to request care online for non-urgent symptoms. For details visit [email or web address].   Also download the MyChart app! Go to the app store, search "MyChart", open the app, select [facility], and log in with your MyChart username and password.   

## 2023-05-26 NOTE — Progress Notes (Signed)
[facility] [phone]   [phone]) [phone]  OFFICE PROGRESS NOTE  Medicine, [facility] [address] [place] [address]  DIAGNOSIS: stage IIIb (T4, N2, M0) non-small cell lung cancer, poorly differentiated carcinoma with neuroendocrine features diagnosed in January 2024 and presented with large right lower lobe lung mass in addition to right hilar and mediastinal lymphadenopathy.  PRIOR THERAPY:   1) status post short course of palliative radiotherapy with 30 Gray completed in March 2023.  2) A course of concurrent chemoradiation with weekly carboplatin for AUC of 2 and paclitaxel 45 Mg/M2.  Status post 4 cycles.  CURRENT THERAPY: Consolidation treatment with immunotherapy with Imfinzi 1500 Mg IV every 4 weeks.  First dose 04/29/2023.  Status post 1 cycle.  INTERVAL HISTORY: Richard [name] 52 y.o. male returns to the clinic today for follow-up visit.  The patient is feeling fine with no concerning complaints.  He is always in a rush to get out of the office.  He denied having any current chest pain, shortness of breath, cough or hemoptysis.  He has no nausea, vomiting, diarrhea or constipation.  He has no headache or visual changes.  He has no recent weight loss or night sweats.  He tolerated the first cycle of his treatment with immunotherapy fairly well.  He is here today for evaluation before starting cycle #2.   MEDICAL HISTORY: Past Medical History:  Diagnosis Date   Asthma    Brain bleed (HCC)    DVT (deep venous thrombosis) (HCC) 02/19/2015   RLE   GERD (gastroesophageal reflux disease)    History of home oxygen therapy    Hypertension    Seizures (HCC)    last episode 03/2013   Stroke [facility])     ALLERGIES:  is allergic to dilaudid [hydromorphone hcl] and tape.  MEDICATIONS:  Current Outpatient Medications  Medication Sig Dispense Refill   albuterol (VENTOLIN HFA) 108 (90 Base) MCG/ACT inhaler Inhale 1-2 puffs into the  lungs every 6 (six) hours as needed for wheezing or shortness of breath. 8 g 3   [name] 160-4.5 MCG/ACT inhaler Inhale 2 puffs into the lungs daily.     cloNIDine (CATAPRES) 0.3 MG tablet Take 1 tablet (0.3 mg total) by mouth 2 (two) times daily. (Patient not taking: Reported on 01/15/2023) 60 tablet 0   diclofenac Sodium (VOLTAREN) 1 % GEL Apply 4 g topically 4 (four) times daily. (Patient not taking: Reported on 01/15/2023)     levETIRAcetam (KEPPRA) 500 MG tablet Take 1 tablet (500 mg total) by mouth 2 (two) times daily.     losartan (COZAAR) 100 MG tablet Take 100 mg by mouth daily.     OXYGEN Inhale 2 L/min into the lungs daily as needed (for shortness of breath).     prochlorperazine (COMPAZINE) 10 MG tablet Take 1 tablet (10 mg total) by mouth every 6 (six) hours as needed for nausea or vomiting. 30 tablet 0   No current facility-administered medications for this visit.    SURGICAL HISTORY:  Past Surgical History:  Procedure Laterality Date   BRONCHIAL BIOPSY  02/10/2022   Procedure: BRONCHIAL BIOPSIES;  Surgeon: Oretha Milch, MD;  Location: [facility];  Service: Cardiopulmonary;;   BRONCHIAL BRUSHINGS  02/10/2022   Procedure: BRONCHIAL BRUSHINGS;  Surgeon: Oretha Milch, MD;  Location: [facility];  Service: Cardiopulmonary;;   BRONCHIAL NEEDLE ASPIRATION BIOPSY  02/10/2022   Procedure: BRONCHIAL NEEDLE ASPIRATION BIOPSIES;  Surgeon: Oretha Milch,  MD;  Location: [facility];  Service: Cardiopulmonary;;   BRONCHIAL WASHINGS  02/10/2022   Procedure: BRONCHIAL WASHINGS;  Surgeon: Oretha Milch, MD;  Location: [facility];  Service: Cardiopulmonary;;   ENDOBRONCHIAL ULTRASOUND Bilateral 02/10/2022   Procedure: ENDOBRONCHIAL ULTRASOUND;  Surgeon: Oretha Milch, MD;  Location: [facility];  Service: Cardiopulmonary;  Laterality: Bilateral;   LEG SURGERY     VIDEO BRONCHOSCOPY  02/10/2022   Procedure: VIDEO BRONCHOSCOPY WITHOUT FLUORO;  Surgeon: Oretha Milch, MD;  Location: [facility]  ENDOSCOPY;  Service: Cardiopulmonary;;    REVIEW OF SYSTEMS:  A comprehensive review of systems was negative.   PHYSICAL EXAMINATION: General appearance: alert, cooperative, and no distress Head: Normocephalic, without obvious abnormality, atraumatic Neck: no adenopathy, no JVD, supple, symmetrical, trachea midline, and thyroid not enlarged, symmetric, no tenderness/mass/nodules Lymph nodes: Cervical, supraclavicular, and axillary nodes normal. Resp: clear to auscultation bilaterally Back: symmetric, no curvature. ROM normal. No CVA tenderness. Cardio: regular rate and rhythm, S1, S2 normal, no murmur, click, rub or gallop GI: soft, non-tender; bowel sounds normal; no masses,  no organomegaly Extremities: extremities normal, atraumatic, no cyanosis or edema  ECOG PERFORMANCE STATUS: 1 - Symptomatic but completely ambulatory  Blood pressure (!) 166/113, pulse 91, temperature 97.6 F (36.4 C), temperature source Oral, resp. rate 16, weight 199 lb (90.3 kg), SpO2 100 %.  LABORATORY DATA: Lab Results  Component Value Date   WBC 5.5 05/26/2023   HGB 16.0 05/26/2023   HCT 47.0 05/26/2023   MCV 98.9 05/26/2023   PLT 232 05/26/2023      Chemistry      Component Value Date/Time   NA 143 04/29/2023 1400   K 3.9 04/29/2023 1400   CL 113 (H) 04/29/2023 1400   CO2 24 04/29/2023 1400   BUN 11 04/29/2023 1400   CREATININE 0.85 04/29/2023 1400   CREATININE 0.91 12/05/2013 1605      Component Value Date/Time   CALCIUM 8.5 (L) 04/29/2023 1400   ALKPHOS 83 04/29/2023 1400   AST 12 (L) 04/29/2023 1400   ALT 9 04/29/2023 1400   BILITOT 0.4 04/29/2023 1400       RADIOGRAPHIC STUDIES: No results found.  ASSESSMENT AND PLAN: This is a very pleasant 52 years old African-American male with stage IIIb (T4, N2, M0) non-small cell lung cancer, poorly differentiated carcinoma with neuroendocrine features diagnosed in January 2024 and presented with large right lower lobe lung mass in addition  to right hilar and mediastinal lymphadenopathy.  He is status post short course of palliative radiotherapy with 30 Wallace Cullens completed in March 2023. He is currently on concurrent chemoradiation with weekly carboplatin for AUC of 2 1 paclitaxel 45 Mg/M2 status post 4 cycles.  He was noncompliant with his chemotherapy treatments and received only 3 out of the schedule 7 cycles.  Last cycle was given on March 15, 2023. His scan showed decrease in the size of the right upper lung retrohilar mass as well as decrease in the size of the AP window lymph nodes and no evidence for progression. The patient is currently on treatment with consolidation immunotherapy with Imfinzi 1500 Mg IV every 4 weeks.  He is status post 1 cycle. He tolerated the first cycle of his treatment well with no concerning adverse effects. I recommended for him to proceed with cycle #2 today as planned. I will see him back for follow-up visit in 4 weeks for evaluation before the next cycle of his treatment. For the hypertension he was strongly advised to follow-up  with his primary care provider for treatment of his condition. The patient was advised to call immediately if he has any other concerning symptoms in the interval. The patient voices understanding of current disease status and treatment options and is in agreement with the current care plan.  All questions were answered. The patient knows to call the clinic with any problems, questions or concerns. We can certainly see the patient much sooner if necessary.  The total time spent in the appointment was 20 minutes.  Disclaimer: This note was dictated with voice recognition software. Similar sounding words can inadvertently be transcribed and may not be corrected upon review.

## 2023-05-30 ENCOUNTER — Emergency Department (HOSPITAL_COMMUNITY)
Admission: EM | Admit: 2023-05-30 | Discharge: 2023-05-30 | Disposition: A | Discharge status: Home / Self Care | Payer: 59 | Attending: Emergency Medicine | Admitting: Emergency Medicine

## 2023-05-30 ENCOUNTER — Other Ambulatory Visit: Payer: Self-pay

## 2023-05-30 DIAGNOSIS — Z5329 Procedure and treatment not carried out because of patient's decision for other reasons: Secondary | ICD-10-CM | POA: Insufficient documentation

## 2023-05-30 DIAGNOSIS — R531 Weakness: Secondary | ICD-10-CM | POA: Diagnosis not present

## 2023-05-30 NOTE — ED Notes (Signed)
On arrival to ED, pt states he wants to eat or he is leaving the ED. Pt A&OX4. Provider called to room for eval d/t patient set on leaving at this time. Pt verbally aggressive towards ED provider and states he is going home. States "takes this off of me, I am leaving. Why are you so fucking stupid?" Pt agreeable to sign AMA form at this time. Removed IV and pt ambulatory without assist to the lobby. ED provider states to [facility].

## 2023-05-30 NOTE — ED Notes (Signed)
Patient signed AMA form and leaving ED at this time.

## 2023-05-30 NOTE — ED Provider Notes (Signed)
Upon entering the patient room to introduce myself the pt began yelling at myself and the nurse in the room and shouting profanity directed at myself and staff in the room. I attempted to introduce myself and begin conversation with the patient and he responded with "why the fuck are you asking me all these dumb ass questions, I'm getting the fuck out of here." Pt began removing his leads and removing his IV access. Pt told me to "get the fuck out of my way." I attempted to convince the patient to stay in the ED for evaluation but he began threatening posturing and started walking out of the treatment area. Steady gait observed  The patient has requested to leave the ED against medical advice. I believe this patient is of sound mind and medical decision making capacity to refuse medical care. The patient is responding and asking questions appropriately. The patient is oriented to person, place and time. The patient is not psychotic, delusional, suicidal, homicidal or hallucinating. The patient demonstrates a normal mental capacity to make decisions regarding their healthcare. The patient is clinically sober and does not appear to be under the influence of any illicit drugs at this time. The patient has been advised of the risks, in layman terms, of leaving AMA which include, but are not limited to death, coma, permanent disability, loss of current lifestyle, delay in diagnosis. Alternatives have been offered - the patient remains steadfast in their wish to leave. The patient has been advised that should they change their mind they are welcome to return to this hospital, or any other, at any time. The patient understands that in no way does an AMA discharge mean that I do not want them to have the best medical care available. To this end, I have offered appropriate prescriptions, referrals, and discharge instructions. The patient did not sign AMA paperwork. The above discussion was witnessed by another member of  staff.    Sloan Leiter, DO 05/30/23 0045

## 2023-05-30 NOTE — ED Triage Notes (Signed)
Pt via EMS from home c/o of bilateral legs "losing feeling" and unable to stand or walk. Pt has had some wheezing and chest pains en route. Given duoneb tx with 125 mg Solumedrol en route. Pt reports he wears 4L O2 at baseline at home. Hx of COPD and lung cancer. Pt states he wants to eat , when told he has to wait states he wants to go home.

## 2023-06-08 DIAGNOSIS — C3431 Malignant neoplasm of lower lobe, right bronchus or lung: Secondary | ICD-10-CM | POA: Diagnosis not present

## 2023-06-09 ENCOUNTER — Other Ambulatory Visit: Payer: Self-pay

## 2023-06-22 ENCOUNTER — Other Ambulatory Visit: Payer: Self-pay

## 2023-06-24 ENCOUNTER — Inpatient Hospital Stay: Payer: 59

## 2023-06-24 ENCOUNTER — Inpatient Hospital Stay: Payer: 59 | Admitting: Internal Medicine

## 2023-06-24 ENCOUNTER — Telehealth: Payer: Self-pay | Admitting: Medical Oncology

## 2023-06-24 ENCOUNTER — Inpatient Hospital Stay: Payer: 59 | Admitting: Dietician

## 2023-06-24 NOTE — Telephone Encounter (Signed)
LVM for pt to call me regarding No Show today .

## 2023-06-24 NOTE — Progress Notes (Signed)
Scheduled to see patient in infusion for nutrition follow-up. Patient did not show for treatment today. Will reschedule as able.

## 2023-06-26 ENCOUNTER — Other Ambulatory Visit: Payer: Self-pay

## 2023-06-26 ENCOUNTER — Emergency Department (HOSPITAL_COMMUNITY): Payer: 59

## 2023-06-26 ENCOUNTER — Emergency Department (HOSPITAL_COMMUNITY)
Admission: EM | Admit: 2023-06-26 | Discharge: 2023-06-26 | Discharge status: Against Medical Advice | Payer: 59 | Attending: Emergency Medicine | Admitting: Emergency Medicine

## 2023-06-26 ENCOUNTER — Encounter (HOSPITAL_COMMUNITY): Payer: Self-pay

## 2023-06-26 DIAGNOSIS — R0602 Shortness of breath: Secondary | ICD-10-CM

## 2023-06-26 DIAGNOSIS — C3490 Malignant neoplasm of unspecified part of unspecified bronchus or lung: Secondary | ICD-10-CM

## 2023-06-26 DIAGNOSIS — Z5321 Procedure and treatment not carried out due to patient leaving prior to being seen by health care provider: Secondary | ICD-10-CM | POA: Diagnosis not present

## 2023-06-26 DIAGNOSIS — M7989 Other specified soft tissue disorders: Secondary | ICD-10-CM | POA: Diagnosis not present

## 2023-06-26 MED ORDER — ALBUTEROL SULFATE (2.5 MG/3ML) 0.083% IN NEBU: 2.5000 mg | INHALATION_SOLUTION | Freq: Once | RESPIRATORY_TRACT | Status: DC

## 2023-06-26 MED ORDER — MAGNESIUM SULFATE 2 GM/50ML IV SOLN: 2.0000 g | Freq: Once | INTRAVENOUS | Status: DC

## 2023-06-26 NOTE — ED Notes (Signed)
Patient advised RN that he is leaving , patient walked out the ER . EDP notified .

## 2023-06-26 NOTE — ED Provider Notes (Signed)
[name] Provider Note   CSN: [id number] Arrival date & time: 06/26/23  2120     History {Add pertinent medical, surgical, social history, OB history to HPI:1} Chief Complaint  Patient presents with   Shortness of Breath    Richard [name] is a 52 y.o. male.  Brought in by ambulance from home for shortness of breath.  It sounds like the ambulance has been there already today and he refused transport once.  He has a history of non-small cell lung cancer, follows with Dr. Arbutus Ped and is getting immune therapy once monthly now.  Has already received radiation and chemo.  Continues to smoke, down to half a pack a day.  He is mostly concerned about some swelling on his legs right greater than left.  He has had a nonproductive cough.  Gets short of breath when he exerts himself.  Has trouble sleeping.  He denies any fever, hemoptysis, chest pain, abdominal pain.  He said he is better now since he is gotten here and would like to leave.  He tells me his wife is the one that called the ambulance.  I am not sure he is going to stay for evaluation.  The history is provided by the patient.  Shortness of Breath Severity:  Moderate Onset quality:  Gradual Duration:  4 days Timing:  Intermittent Progression:  Resolved Chronicity:  Recurrent Context: weather changes   Relieved by:  Nothing Worsened by:  Activity and coughing Ineffective treatments:  Oxygen and rest Associated symptoms: cough   Associated symptoms: no abdominal pain, no chest pain, no fever, no hemoptysis, no sputum production and no vomiting   Risk factors: hx of cancer and tobacco use        Home Medications Prior to Admission medications   Medication Sig Start Date End Date Taking? Authorizing Provider  albuterol (VENTOLIN HFA) 108 (90 Base) MCG/ACT inhaler Inhale 1-2 puffs into the lungs every 6 (six) hours as needed for wheezing or shortness of breath. 06/03/22   Parrett, Virgel Bouquet, NP  [name] 160-4.5 MCG/ACT inhaler Inhale 2 puffs into the lungs daily. 12/22/22   [provider]  cloNIDine (CATAPRES) 0.3 MG tablet Take 1 tablet (0.3 mg total) by mouth 2 (two) times daily. Patient not taking: Reported on 01/15/2023 02/11/22   Albertine Grates, MD  diclofenac Sodium (VOLTAREN) 1 % GEL Apply 4 g topically 4 (four) times daily. Patient not taking: Reported on 01/15/2023 01/12/23   Marrianne Mood, MD  levETIRAcetam (KEPPRA) 500 MG tablet Take 1 tablet (500 mg total) by mouth 2 (two) times daily. 01/17/23   Marrianne Mood, MD  losartan (COZAAR) 100 MG tablet Take 100 mg by mouth daily. 12/31/22   [provider]  OXYGEN Inhale 2 L/min into the lungs daily as needed (for shortness of breath).    [provider]  prochlorperazine (COMPAZINE) 10 MG tablet Take 1 tablet (10 mg total) by mouth every 6 (six) hours as needed for nausea or vomiting. 01/20/23   Si Gaul, MD      Allergies    Dilaudid [hydromorphone hcl] and Tape    Review of Systems   Review of Systems  Constitutional:  Negative for fever.  Respiratory:  Positive for cough and shortness of breath. Negative for hemoptysis and sputum production.   Cardiovascular:  Positive for leg swelling. Negative for chest pain.  Gastrointestinal:  Negative for abdominal pain and vomiting.    Physical Exam Updated Vital Signs  BP (!) 141/95 (BP Location: Right Arm)   Pulse (!) 109   Temp 98.1 F (36.7 C) (Oral)   Resp 19   Ht 5\' 9"  (1.753 m)   Wt 90.7 kg   SpO2 91%   BMI 29.53 kg/m  Physical Exam Vitals and nursing note reviewed.  Constitutional:      General: He is not in acute distress.    Appearance: Normal appearance. He is well-developed.  HENT:     Head: Normocephalic and atraumatic.  Eyes:     Conjunctiva/sclera: Conjunctivae normal.  Cardiovascular:     Rate and Rhythm: Regular rhythm. Tachycardia present.     Heart sounds: No murmur heard. Pulmonary:     Effort: Accessory muscle  usage present. No respiratory distress.     Breath sounds: Wheezing (few scattered) present.  Abdominal:     Palpations: Abdomen is soft.     Tenderness: There is no abdominal tenderness. There is no guarding or rebound.  Musculoskeletal:        General: No swelling.     Cervical back: Neck supple.     Right lower leg: Edema present.     Left lower leg: Edema present.     Comments: He has trace to 1+ edema both of his legs.  No cords appreciated.  Skin:    General: Skin is warm and dry.     Capillary Refill: Capillary refill takes less than 2 seconds.  Neurological:     General: No focal deficit present.     Mental Status: He is alert.     Sensory: No sensory deficit.     Motor: No weakness.     ED Results / Procedures / Treatments   Labs (all labs ordered are listed, but only abnormal results are displayed) Labs Reviewed  BASIC METABOLIC PANEL  BRAIN NATRIURETIC PEPTIDE  CBC WITH DIFFERENTIAL/PLATELET  TROPONIN I (HIGH SENSITIVITY)    EKG None  Radiology No results found.  Procedures Procedures  {Document cardiac monitor, telemetry assessment procedure when appropriate:1}  Medications Ordered in ED Medications - No data to display  ED Course/ Medical Decision Making/ A&P   {   Click here for ABCD2, HEART and other calculatorsREFRESH Note before signing :1}                          Medical Decision Making Amount and/or Complexity of Data Reviewed Labs: ordered. Radiology: ordered.   This patient complains of ***; this involves an extensive number of treatment Options and is a complaint that carries with it a high risk of complications and morbidity. The differential includes ***  I ordered, reviewed and interpreted labs, which included *** I ordered medication *** and reviewed PMP when indicated. I ordered imaging studies which included *** and I independently    visualized and interpreted imaging which showed *** Additional history obtained from  *** Previous records obtained and reviewed *** I consulted *** and discussed lab and imaging findings and discussed disposition.  Cardiac monitoring reviewed, *** Social determinants considered, *** Critical Interventions: ***  After the interventions stated above, I reevaluated the patient and found *** Admission and further testing considered, ***   {Document critical care time when appropriate:1} {Document review of labs and clinical decision tools ie heart score, Chads2Vasc2 etc:1}  {Document your independent review of radiology images, and any outside records:1} {Document your discussion with family members, caretakers, and with consultants:1} {Document social determinants of health affecting pt's care:1} {  Document your decision making why or why not admission, treatments were needed:1} Final Clinical Impression(s) / ED Diagnoses Final diagnoses:  None    Rx / DC Orders ED Discharge Orders     None

## 2023-06-26 NOTE — ED Triage Notes (Signed)
Arrives EMS from home. Initially called out for being shob but refused transportation.   Called out for assistance again for same shob and agreed to transport.   Upon arrival pt refuses room placement and wants to sit in waiting room so he can talk to people. Requests to sign out AMA.   1 duoneb given prior to arrival for mild wheezing.

## 2023-07-08 DIAGNOSIS — C3431 Malignant neoplasm of lower lobe, right bronchus or lung: Secondary | ICD-10-CM | POA: Diagnosis not present

## 2023-07-21 ENCOUNTER — Inpatient Hospital Stay: Payer: 59

## 2023-07-21 ENCOUNTER — Inpatient Hospital Stay: Payer: 59 | Admitting: Internal Medicine

## 2023-07-21 ENCOUNTER — Inpatient Hospital Stay: Payer: 59 | Admitting: Dietician

## 2023-07-21 ENCOUNTER — Inpatient Hospital Stay: Payer: 59 | Attending: Internal Medicine

## 2023-07-21 DIAGNOSIS — C7B8 Other secondary neuroendocrine tumors: Secondary | ICD-10-CM | POA: Insufficient documentation

## 2023-07-21 DIAGNOSIS — Z79899 Other long term (current) drug therapy: Secondary | ICD-10-CM | POA: Insufficient documentation

## 2023-07-21 DIAGNOSIS — C7A1 Malignant poorly differentiated neuroendocrine tumors: Secondary | ICD-10-CM | POA: Insufficient documentation

## 2023-07-21 NOTE — Progress Notes (Signed)
Scheduled to see patient in infusion. Patient did not show for appointments today. This is the second treatment patient has missed. Will reschedule as able.

## 2023-07-23 ENCOUNTER — Other Ambulatory Visit: Payer: Self-pay

## 2023-07-26 ENCOUNTER — Encounter: Payer: Self-pay | Admitting: Internal Medicine

## 2023-07-26 DIAGNOSIS — R531 Weakness: Secondary | ICD-10-CM | POA: Diagnosis not present

## 2023-07-28 ENCOUNTER — Inpatient Hospital Stay: Payer: 59 | Admitting: Licensed Clinical Social Worker

## 2023-07-28 DIAGNOSIS — C3431 Malignant neoplasm of lower lobe, right bronchus or lung: Secondary | ICD-10-CM

## 2023-07-28 NOTE — Progress Notes (Signed)
[facility] CSW Progress Note  Clinical Child psychotherapist contacted patient by phone to inquire about missed appointments.  Pt informed CSW that his phone broke and he needed to get a new phone which means he lost all of his contact numbers.  Pt states he had come in for an appointment and when he arrived he was told he did not have an appointment on that day.  Pt states he was informed someone from scheduling would call him with his new appointment date and no one ever called.  Pt also reports having difficulty with transportation and not being able to rely on them when he books them.  CSW informed medical team of the above.  Medical team to call pt to confirm when his next appointment will be and provide pt w/ all pertinent numbers to be reprogrammed into pt's new phone.  Pt provided w/ contact details for CSW to have in the event pt has any questions or is unsure who to reach out to.  CSW to remain available to provide support as appropriate throughout duration of treatment.      Rachel Moulds, LCSW Clinical Social Worker [facility]    Patient is participating in a Managed Medicaid Plan:  Yes

## 2023-07-29 ENCOUNTER — Telehealth: Payer: Self-pay | Admitting: Internal Medicine

## 2023-07-29 NOTE — Telephone Encounter (Signed)
Scheduled per scheduling message, patient has been called and voicemail was left.

## 2023-07-31 ENCOUNTER — Other Ambulatory Visit: Payer: Self-pay

## 2023-07-31 ENCOUNTER — Encounter (HOSPITAL_COMMUNITY): Payer: Self-pay

## 2023-07-31 ENCOUNTER — Emergency Department (HOSPITAL_COMMUNITY)
Admission: EM | Admit: 2023-07-31 | Discharge: 2023-07-31 | Disposition: A | Discharge status: Home / Self Care | Payer: 59 | Attending: Emergency Medicine | Admitting: Emergency Medicine

## 2023-07-31 DIAGNOSIS — R0902 Hypoxemia: Secondary | ICD-10-CM | POA: Insufficient documentation

## 2023-07-31 DIAGNOSIS — R569 Unspecified convulsions: Secondary | ICD-10-CM | POA: Diagnosis not present

## 2023-07-31 NOTE — Discharge Instructions (Signed)
We had offered you further evaluation  for your low oxygen level and your seizure.  Typically we would do some tests to make sure nothing was offered that you had not developed a pneumonia.  Please follow-up with your family doctor in the office.  Please return anytime he would like to be evaluated.

## 2023-07-31 NOTE — ED Notes (Signed)
Pt refusing vitals to be taken, pt is adamant about leaving, wife is waiting in lobby

## 2023-07-31 NOTE — ED Notes (Signed)
Wife arrived with oxygen tank/ pt will be taken out by wheelchair

## 2023-07-31 NOTE — ED Triage Notes (Signed)
BIB EMS/ x6 focal seizures/ hx of same/ takes dilantin/ pt is uncooperative and refuses care at this time/ pt states he wants to go home

## 2023-07-31 NOTE — ED Provider Notes (Signed)
[place] [facility] AT [facility] Provider Note   CSN: [id number] Arrival date & time: 07/31/23  2135     History  Chief Complaint  Patient presents with   Seizures    Richard [name] is a 52 y.o. male.  52 yo M with a chief complaints of having a seizure.  The patient tells me he thinks it is because his oxygen level was low.  Tells me that he normally is on 4 L of oxygen at all times and has not really been wearing it.  He feels better now that he is on his oxygen in the room and would like to go home.  He denies cough congestion or fever.  Denies any difficulty breathing otherwise.  Feels like he has been taking his medicines as he wants to.   Seizures      Home Medications Prior to Admission medications   Medication Sig Start Date End Date Taking? Authorizing Provider  albuterol (VENTOLIN HFA) 108 (90 Base) MCG/ACT inhaler Inhale 1-2 puffs into the lungs every 6 (six) hours as needed for wheezing or shortness of breath. 06/03/22   Parrett, Virgel Bouquet, NP  [name] 160-4.5 MCG/ACT inhaler Inhale 2 puffs into the lungs daily. 12/22/22   [provider]  cloNIDine (CATAPRES) 0.3 MG tablet Take 1 tablet (0.3 mg total) by mouth 2 (two) times daily. Patient not taking: Reported on 01/15/2023 02/11/22   Albertine Grates, MD  diclofenac Sodium (VOLTAREN) 1 % GEL Apply 4 g topically 4 (four) times daily. Patient not taking: Reported on 01/15/2023 01/12/23   Marrianne Mood, MD  levETIRAcetam (KEPPRA) 500 MG tablet Take 1 tablet (500 mg total) by mouth 2 (two) times daily. 01/17/23   Marrianne Mood, MD  losartan (COZAAR) 100 MG tablet Take 100 mg by mouth daily. 12/31/22   [provider]  OXYGEN Inhale 2 L/min into the lungs daily as needed (for shortness of breath).    [provider]  prochlorperazine (COMPAZINE) 10 MG tablet Take 1 tablet (10 mg total) by mouth every 6 (six) hours as needed for nausea or vomiting. 01/20/23   Si Gaul, MD       Allergies    Dilaudid [hydromorphone hcl] and Tape    Review of Systems   Review of Systems  Neurological:  Positive for seizures.    Physical Exam Updated Vital Signs BP (!) 147/96 (BP Location: Left Arm)   Pulse (!) 106   Temp 97.6 F (36.4 C) (Oral)   Resp 17   Wt 92.1 kg   SpO2 91%   BMI 29.98 kg/m  Physical Exam Vitals and nursing note reviewed.  Constitutional:      Appearance: He is well-developed.  HENT:     Head: Normocephalic and atraumatic.  Eyes:     Pupils: Pupils are equal, round, and reactive to light.  Neck:     Vascular: No JVD.  Cardiovascular:     Rate and Rhythm: Normal rate and regular rhythm.     Heart sounds: No murmur heard.    No friction rub. No gallop.  Pulmonary:     Effort: No respiratory distress.     Breath sounds: No wheezing.  Abdominal:     General: There is no distension.     Tenderness: There is no abdominal tenderness. There is no guarding or rebound.  Musculoskeletal:        General: Normal range of motion.     Cervical back: Normal range of motion and  neck supple.  Skin:    Coloration: Skin is not pale.     Findings: No rash.  Neurological:     Mental Status: He is alert and oriented to person, place, and time.  Psychiatric:        Behavior: Behavior normal.     ED Results / Procedures / Treatments   Labs (all labs ordered are listed, but only abnormal results are displayed) Labs Reviewed - No data to display  EKG None  Radiology No results found.  Procedures Procedures    Medications Ordered in ED Medications - No data to display  ED Course/ Medical Decision Making/ A&P                                 Medical Decision Making  52 yo M with a chief complaint of a seizure.  Patient is at his baseline now and would like to go home.  He is on his home 4 L of oxygen and is satting between 88 and 92 when he is awake.  When he falls asleep he dips down into the mid to low 80s.  I discussed with him about  further evaluation here including laboratory testing and imaging which he is currently declining.  He is at his baseline and is able to discuss with me the risks I feel he is able to make this decision on his own.  Will discharge home.  10:27 PM:  I have discussed the diagnosis/risks/treatment options with the patient.  Evaluation and diagnostic testing in the emergency department does not suggest an emergent condition requiring admission or immediate intervention beyond what has been performed at this time.  They will follow up with PCP. We also discussed returning to the ED immediately if new or worsening sx occur. We discussed the sx which are most concerning (e.g., sudden worsening pain, fever, inability to tolerate by mouth) that necessitate immediate return. Medications administered to the patient during their visit and any new prescriptions provided to the patient are listed below.  Medications given during this visit Medications - No data to display   The patient appears reasonably screen and/or stabilized for discharge and I doubt any other medical condition or other [facility] requiring further screening, evaluation, or treatment in the ED at this time prior to discharge.          Final Clinical Impression(s) / ED Diagnoses Final diagnoses:  Seizure [facility])  Hypoxia    Rx / DC Orders ED Discharge Orders     None         Melene Plan, DO 07/31/23 2227

## 2023-08-02 ENCOUNTER — Other Ambulatory Visit: Payer: Self-pay

## 2023-08-02 DIAGNOSIS — C3431 Malignant neoplasm of lower lobe, right bronchus or lung: Secondary | ICD-10-CM

## 2023-08-03 ENCOUNTER — Inpatient Hospital Stay (HOSPITAL_BASED_OUTPATIENT_CLINIC_OR_DEPARTMENT_OTHER): Payer: 59 | Admitting: Internal Medicine

## 2023-08-03 ENCOUNTER — Inpatient Hospital Stay: Payer: 59

## 2023-08-03 VITALS — BP 161/122 | HR 82 | Temp 98.2°F | Resp 17 | Ht 69.0 in | Wt 193.0 lb

## 2023-08-03 DIAGNOSIS — C3431 Malignant neoplasm of lower lobe, right bronchus or lung: Secondary | ICD-10-CM | POA: Diagnosis not present

## 2023-08-03 DIAGNOSIS — C7A1 Malignant poorly differentiated neuroendocrine tumors: Secondary | ICD-10-CM | POA: Diagnosis present

## 2023-08-03 DIAGNOSIS — C7B8 Other secondary neuroendocrine tumors: Secondary | ICD-10-CM | POA: Diagnosis not present

## 2023-08-03 DIAGNOSIS — Z79899 Other long term (current) drug therapy: Secondary | ICD-10-CM | POA: Diagnosis not present

## 2023-08-03 LAB — CBC WITH DIFFERENTIAL (CANCER CENTER ONLY)
Abs Immature Granulocytes: 0.03 10*3/uL (ref 0.00–0.07)
Basophils Absolute: 0 10*3/uL (ref 0.0–0.1)
Basophils Relative: 1 %
Eosinophils Absolute: 0 10*3/uL (ref 0.0–0.5)
Eosinophils Relative: 0 %
HCT: 50.7 % (ref 39.0–52.0)
Hemoglobin: 17.3 g/dL — ABNORMAL HIGH (ref 13.0–17.0)
Immature Granulocytes: 1 %
Lymphocytes Relative: 10 %
Lymphs Abs: 0.5 10*3/uL — ABNORMAL LOW (ref 0.7–4.0)
MCH: 33 pg (ref 26.0–34.0)
MCHC: 34.1 g/dL (ref 30.0–36.0)
MCV: 96.6 fL (ref 80.0–100.0)
Monocytes Absolute: 0.8 10*3/uL (ref 0.1–1.0)
Monocytes Relative: 15 %
Neutro Abs: 4 10*3/uL (ref 1.7–7.7)
Neutrophils Relative %: 73 %
Platelet Count: 238 10*3/uL (ref 150–400)
RBC: 5.25 MIL/uL (ref 4.22–5.81)
RDW: 13.8 % (ref 11.5–15.5)
WBC Count: 5.3 10*3/uL (ref 4.0–10.5)
nRBC: 0 % (ref 0.0–0.2)

## 2023-08-03 LAB — CMP (CANCER CENTER ONLY)
ALT: 11 U/L (ref 0–44)
AST: 13 U/L — ABNORMAL LOW (ref 15–41)
Albumin: 4.1 g/dL (ref 3.5–5.0)
Alkaline Phosphatase: 73 U/L (ref 38–126)
Anion gap: 6 (ref 5–15)
BUN: 12 mg/dL (ref 6–20)
CO2: 28 mmol/L (ref 22–32)
Calcium: 9.5 mg/dL (ref 8.9–10.3)
Chloride: 107 mmol/L (ref 98–111)
Creatinine: 0.93 mg/dL (ref 0.61–1.24)
GFR, Estimated: >60 mL/min (ref >60)
Glucose, Bld: 90 mg/dL (ref 70–99)
Potassium: 4.1 mmol/L (ref 3.5–5.1)
Sodium: 141 mmol/L (ref 135–145)
Total Bilirubin: 0.6 mg/dL (ref 0.3–1.2)
Total Protein: 7.5 g/dL (ref 6.5–8.1)

## 2023-08-03 LAB — TSH: TSH: 1.541 u[IU]/mL (ref 0.350–4.500)

## 2023-08-03 NOTE — Progress Notes (Signed)
[facility] [phone]) [phone]   Fax:(336) [phone]  OFFICE PROGRESS NOTE  Medicine, [facility] [address] [place] [address]  DIAGNOSIS: stage IIIb (T4, N2, M0) non-small cell lung cancer, poorly differentiated carcinoma with neuroendocrine features diagnosed in January 2024 and presented with large right lower lobe lung mass in addition to right hilar and mediastinal lymphadenopathy.  PRIOR THERAPY:   1) status post short course of palliative radiotherapy with 30 Gray completed in March 2023.  2) A course of concurrent chemoradiation with weekly carboplatin for AUC of 2 and paclitaxel 45 Mg/M2.  Status post 4 cycles.  CURRENT THERAPY: Consolidation treatment with immunotherapy with Imfinzi 1500 Mg IV every 4 weeks.  First dose 04/29/2023.  Status post 2 cycles.  INTERVAL HISTORY: Richard [name] 52 y.o. male returns to the clinic today for follow-up visit.  The patient was lost to follow-up since June 2024.  There was a lot of miscommunication with scheduling as well as transportation.  He denied having any current chest pain but has shortness of breath at baseline increased with exertion.  He is currently on several blood pressure medication and his blood pressure is high today.  He has no nausea, vomiting, diarrhea or constipation.  He has no headache or visual changes.  He denied having any recent weight loss or night sweats.  He is here today for reevaluation and to resume his treatment.   MEDICAL HISTORY: Past Medical History:  Diagnosis Date   Asthma    Brain bleed (HCC)    DVT (deep venous thrombosis) (HCC) 02/19/2015   RLE   GERD (gastroesophageal reflux disease)    History of home oxygen therapy    Hypertension    Seizures (HCC)    last episode 03/2013   Stroke [facility])     ALLERGIES:  is allergic to dilaudid [hydromorphone hcl] and tape.  MEDICATIONS:  Current Outpatient Medications  Medication Sig Dispense Refill    albuterol (VENTOLIN HFA) 108 (90 Base) MCG/ACT inhaler Inhale 1-2 puffs into the lungs every 6 (six) hours as needed for wheezing or shortness of breath. 8 g 3   [name] 160-4.5 MCG/ACT inhaler Inhale 2 puffs into the lungs daily.     cloNIDine (CATAPRES) 0.3 MG tablet Take 1 tablet (0.3 mg total) by mouth 2 (two) times daily. (Patient not taking: Reported on 01/15/2023) 60 tablet 0   diclofenac Sodium (VOLTAREN) 1 % GEL Apply 4 g topically 4 (four) times daily. (Patient not taking: Reported on 01/15/2023)     levETIRAcetam (KEPPRA) 500 MG tablet Take 1 tablet (500 mg total) by mouth 2 (two) times daily.     losartan (COZAAR) 100 MG tablet Take 100 mg by mouth daily.     OXYGEN Inhale 2 L/min into the lungs daily as needed (for shortness of breath).     prochlorperazine (COMPAZINE) 10 MG tablet Take 1 tablet (10 mg total) by mouth every 6 (six) hours as needed for nausea or vomiting. 30 tablet 0   No current facility-administered medications for this visit.    SURGICAL HISTORY:  Past Surgical History:  Procedure Laterality Date   BRONCHIAL BIOPSY  02/10/2022   Procedure: BRONCHIAL BIOPSIES;  Surgeon: Oretha Milch, MD;  Location: [facility];  Service: Cardiopulmonary;;   BRONCHIAL BRUSHINGS  02/10/2022   Procedure: BRONCHIAL BRUSHINGS;  Surgeon: Oretha Milch, MD;  Location: [facility];  Service: Cardiopulmonary;;   BRONCHIAL NEEDLE ASPIRATION BIOPSY  02/10/2022   Procedure:  BRONCHIAL NEEDLE ASPIRATION BIOPSIES;  Surgeon: Oretha Milch, MD;  Location: [facility];  Service: Cardiopulmonary;;   BRONCHIAL WASHINGS  02/10/2022   Procedure: BRONCHIAL WASHINGS;  Surgeon: Oretha Milch, MD;  Location: [facility];  Service: Cardiopulmonary;;   ENDOBRONCHIAL ULTRASOUND Bilateral 02/10/2022   Procedure: ENDOBRONCHIAL ULTRASOUND;  Surgeon: Oretha Milch, MD;  Location: [facility];  Service: Cardiopulmonary;  Laterality: Bilateral;   LEG SURGERY     VIDEO BRONCHOSCOPY  02/10/2022   Procedure:  VIDEO BRONCHOSCOPY WITHOUT FLUORO;  Surgeon: Oretha Milch, MD;  Location: [facility];  Service: Cardiopulmonary;;    REVIEW OF SYSTEMS:  A comprehensive review of systems was negative except for: Constitutional: positive for fatigue Respiratory: positive for dyspnea on exertion   PHYSICAL EXAMINATION: General appearance: alert, cooperative, and no distress Head: Normocephalic, without obvious abnormality, atraumatic Neck: no adenopathy, no JVD, supple, symmetrical, trachea midline, and thyroid not enlarged, symmetric, no tenderness/mass/nodules Lymph nodes: Cervical, supraclavicular, and axillary nodes normal. Resp: clear to auscultation bilaterally Back: symmetric, no curvature. ROM normal. No CVA tenderness. Cardio: regular rate and rhythm, S1, S2 normal, no murmur, click, rub or gallop GI: soft, non-tender; bowel sounds normal; no masses,  no organomegaly Extremities: extremities normal, atraumatic, no cyanosis or edema  ECOG PERFORMANCE STATUS: 1 - Symptomatic but completely ambulatory  Blood pressure (!) 161/122, pulse 82, temperature 98.2 F (36.8 C), temperature source Oral, resp. rate 17, height 5\' 9"  (1.753 m), weight 193 lb (87.5 kg), SpO2 96%.  LABORATORY DATA: Lab Results  Component Value Date   WBC 5.5 05/26/2023   HGB 16.0 05/26/2023   HCT 47.0 05/26/2023   MCV 98.9 05/26/2023   PLT 232 05/26/2023      Chemistry      Component Value Date/Time   NA 141 05/26/2023 0846   K 4.1 05/26/2023 0846   CL 109 05/26/2023 0846   CO2 24 05/26/2023 0846   BUN 10 05/26/2023 0846   CREATININE 0.74 05/26/2023 0846   CREATININE 0.91 12/05/2013 1605      Component Value Date/Time   CALCIUM 9.3 05/26/2023 0846   ALKPHOS 83 05/26/2023 0846   AST 12 (L) 05/26/2023 0846   ALT 6 05/26/2023 0846   BILITOT 0.7 05/26/2023 0846       RADIOGRAPHIC STUDIES: No results found.  ASSESSMENT AND PLAN: This is a very pleasant 52 years old African-American male with stage IIIb  (T4, N2, M0) non-small cell lung cancer, poorly differentiated carcinoma with neuroendocrine features diagnosed in January 2024 and presented with large right lower lobe lung mass in addition to right hilar and mediastinal lymphadenopathy.  He is status post short course of palliative radiotherapy with 30 Wallace Cullens completed in March 2023. He completed concurrent chemoradiation with weekly carboplatin for AUC of 2 1 paclitaxel 45 Mg/M2 status post 4 cycles.  He was noncompliant with his chemotherapy treatments and received only 3 out of the schedule 7 cycles.  Last cycle was given on March 15, 2023. His scan showed decrease in the size of the right upper lung retrohilar mass as well as decrease in the size of the AP window lymph nodes and no evidence for progression. The patient is currently on treatment with consolidation immunotherapy with Imfinzi 1500 Mg IV every 4 weeks.  He is status post 2 cycle. The patient has been off treatment since June 2024 because he missed a lot of appointments. I had a lengthy discussion with the patient today about his current condition and treatment options.  I  gave the patient the option of continuous observation and monitoring versus resuming his treatment with immunotherapy again.  He is not interested in resuming his treatment and he will be scheduled for the next cycle of his treatment on August 18, 2023. I will arrange for the patient to have repeat CT scan of the chest after the next cycle of his treatment. For the hypertension he was advised to take his blood pressure medication as prescribed and to monitor it closely at home and discussed with his primary care physician for any adjustment of his medication. The patient was advised to call immediately if he has any other concerning symptoms in the interval. The patient voices understanding of current disease status and treatment options and is in agreement with the current care plan.  All questions were answered. The  patient knows to call the clinic with any problems, questions or concerns. We can certainly see the patient much sooner if necessary.  The total time spent in the appointment was 30 minutes.  Disclaimer: This note was dictated with voice recognition software. Similar sounding words can inadvertently be transcribed and may not be corrected upon review.

## 2023-08-04 LAB — T4: T4, Total: 7.7 ug/dL (ref 4.5–12.0)

## 2023-08-08 DIAGNOSIS — C3431 Malignant neoplasm of lower lobe, right bronchus or lung: Secondary | ICD-10-CM | POA: Diagnosis not present

## 2023-08-11 ENCOUNTER — Encounter: Payer: Self-pay | Admitting: Internal Medicine

## 2023-08-15 ENCOUNTER — Encounter: Payer: Self-pay | Admitting: Internal Medicine

## 2023-08-18 ENCOUNTER — Inpatient Hospital Stay: Payer: 59

## 2023-08-18 ENCOUNTER — Inpatient Hospital Stay: Payer: 59 | Admitting: Dietician

## 2023-08-18 ENCOUNTER — Inpatient Hospital Stay: Payer: 59 | Attending: Internal Medicine

## 2023-08-18 ENCOUNTER — Inpatient Hospital Stay (HOSPITAL_BASED_OUTPATIENT_CLINIC_OR_DEPARTMENT_OTHER): Payer: 59 | Admitting: Internal Medicine

## 2023-08-18 VITALS — BP 165/119 | HR 94 | Temp 98.0°F | Resp 17 | Ht 69.0 in | Wt 192.0 lb

## 2023-08-18 VITALS — BP 119/85 | HR 94 | Resp 16

## 2023-08-18 DIAGNOSIS — Z79620 Long term (current) use of immunosuppressive biologic: Secondary | ICD-10-CM | POA: Diagnosis not present

## 2023-08-18 DIAGNOSIS — Z5112 Encounter for antineoplastic immunotherapy: Secondary | ICD-10-CM | POA: Diagnosis present

## 2023-08-18 DIAGNOSIS — I10 Essential (primary) hypertension: Secondary | ICD-10-CM

## 2023-08-18 DIAGNOSIS — C3490 Malignant neoplasm of unspecified part of unspecified bronchus or lung: Secondary | ICD-10-CM

## 2023-08-18 DIAGNOSIS — C3431 Malignant neoplasm of lower lobe, right bronchus or lung: Secondary | ICD-10-CM

## 2023-08-18 DIAGNOSIS — C7A1 Malignant poorly differentiated neuroendocrine tumors: Secondary | ICD-10-CM | POA: Insufficient documentation

## 2023-08-18 LAB — CMP (CANCER CENTER ONLY)
ALT: 9 U/L (ref 0–44)
AST: 15 U/L (ref 15–41)
Albumin: 4.3 g/dL (ref 3.5–5.0)
Alkaline Phosphatase: 88 U/L (ref 38–126)
Anion gap: 9 (ref 5–15)
BUN: 8 mg/dL (ref 6–20)
CO2: 26 mmol/L (ref 22–32)
Calcium: 9.6 mg/dL (ref 8.9–10.3)
Chloride: 105 mmol/L (ref 98–111)
Creatinine: 0.88 mg/dL (ref 0.61–1.24)
GFR, Estimated: >60 mL/min (ref >60)
Glucose, Bld: 73 mg/dL (ref 70–99)
Potassium: 4.2 mmol/L (ref 3.5–5.1)
Sodium: 140 mmol/L (ref 135–145)
Total Bilirubin: 0.4 mg/dL (ref 0.3–1.2)
Total Protein: 7.9 g/dL (ref 6.5–8.1)

## 2023-08-18 LAB — CBC WITH DIFFERENTIAL (CANCER CENTER ONLY)
Abs Immature Granulocytes: 0.01 10*3/uL (ref 0.00–0.07)
Basophils Absolute: 0 10*3/uL (ref 0.0–0.1)
Basophils Relative: 0 %
Eosinophils Absolute: 0 10*3/uL (ref 0.0–0.5)
Eosinophils Relative: 0 %
HCT: 54 % — ABNORMAL HIGH (ref 39.0–52.0)
Hemoglobin: 18.1 g/dL — ABNORMAL HIGH (ref 13.0–17.0)
Immature Granulocytes: 0 %
Lymphocytes Relative: 11 %
Lymphs Abs: 0.5 10*3/uL — ABNORMAL LOW (ref 0.7–4.0)
MCH: 32 pg (ref 26.0–34.0)
MCHC: 33.5 g/dL (ref 30.0–36.0)
MCV: 95.4 fL (ref 80.0–100.0)
Monocytes Absolute: 0.8 10*3/uL (ref 0.1–1.0)
Monocytes Relative: 17 %
Neutro Abs: 3.2 10*3/uL (ref 1.7–7.7)
Neutrophils Relative %: 72 %
Platelet Count: 212 10*3/uL (ref 150–400)
RBC: 5.66 MIL/uL (ref 4.22–5.81)
RDW: 14 % (ref 11.5–15.5)
WBC Count: 4.5 10*3/uL (ref 4.0–10.5)
nRBC: 0 % (ref 0.0–0.2)

## 2023-08-18 LAB — TSH: TSH: 1.119 u[IU]/mL (ref 0.350–4.500)

## 2023-08-18 MED ORDER — SODIUM CHLORIDE 0.9 % IV SOLN: Freq: Once | INTRAVENOUS | Status: AC

## 2023-08-18 MED ORDER — CLONIDINE HCL 0.1 MG PO TABS
0.2000 mg | ORAL_TABLET | Freq: Once | ORAL | Status: AC
  Administered 2023-08-18: 0.2 mg via ORAL
  Filled 2023-08-18: qty 2

## 2023-08-18 MED ORDER — SODIUM CHLORIDE 0.9 % IV SOLN
1500.0000 mg | Freq: Once | INTRAVENOUS | Status: AC
  Administered 2023-08-18: 1500 mg via INTRAVENOUS
  Filled 2023-08-18: qty 30

## 2023-08-18 NOTE — Progress Notes (Signed)
Richard [name] [phone]   Fax:([phone]  OFFICE PROGRESS NOTE  Medicine, [facility] [address] [place] [address]  DIAGNOSIS: stage IIIb (T4, N2, M0) non-small cell lung cancer, poorly differentiated carcinoma with neuroendocrine features diagnosed in January 2024 and presented with large right lower lobe lung mass in addition to right hilar and mediastinal lymphadenopathy.  PRIOR THERAPY:   1) status post short course of palliative radiotherapy with 30 Gray completed in March 2023.  2) A course of concurrent chemoradiation with weekly carboplatin for AUC of 2 and paclitaxel 45 Mg/M2.  Status post 4 cycles.  CURRENT THERAPY: Consolidation treatment with immunotherapy with Imfinzi 1500 Mg IV every 4 weeks.  First dose 04/29/2023.  Status post 2 cycles.  INTERVAL HISTORY: Richard [name] 52 y.o. male returns to the clinic today for follow-up visit.  The patient is feeling fine today with no concerning complaints except for the mild fatigue and hypertension.  He did not bring his oxygen tank with him today and his oxygen saturation was low at 85%.  He denied having any current chest pain, shortness of breath except with exertion.  He has no cough or hemoptysis.  He denied having any nausea, vomiting, diarrhea or constipation.  He has no headache or visual changes.  He is here today to resume his consolidation treatment with immunotherapy.  MEDICAL HISTORY: Past Medical History:  Diagnosis Date   Asthma    Brain bleed (HCC)    DVT (deep venous thrombosis) (HCC) 02/19/2015   RLE   GERD (gastroesophageal reflux disease)    History of home oxygen therapy    Hypertension    Seizures (HCC)    last episode 03/2013   Stroke [facility])     ALLERGIES:  is allergic to dilaudid [hydromorphone hcl] and tape.  MEDICATIONS:  Current Outpatient Medications  Medication Sig Dispense Refill   albuterol (VENTOLIN HFA) 108 (90 Base) MCG/ACT inhaler  Inhale 1-2 puffs into the lungs every 6 (six) hours as needed for wheezing or shortness of breath. 8 g 3   [name] 160-4.5 MCG/ACT inhaler Inhale 2 puffs into the lungs daily.     cloNIDine (CATAPRES) 0.3 MG tablet Take 1 tablet (0.3 mg total) by mouth 2 (two) times daily. (Patient not taking: Reported on 01/15/2023) 60 tablet 0   diclofenac Sodium (VOLTAREN) 1 % GEL Apply 4 g topically 4 (four) times daily. (Patient not taking: Reported on 01/15/2023)     levETIRAcetam (KEPPRA) 500 MG tablet Take 1 tablet (500 mg total) by mouth 2 (two) times daily.     losartan (COZAAR) 100 MG tablet Take 100 mg by mouth daily.     OXYGEN Inhale 2 L/min into the lungs daily as needed (for shortness of breath).     prochlorperazine (COMPAZINE) 10 MG tablet Take 1 tablet (10 mg total) by mouth every 6 (six) hours as needed for nausea or vomiting. 30 tablet 0   No current facility-administered medications for this visit.    SURGICAL HISTORY:  Past Surgical History:  Procedure Laterality Date   BRONCHIAL BIOPSY  02/10/2022   Procedure: BRONCHIAL BIOPSIES;  Surgeon: Oretha Milch, MD;  Location: [facility];  Service: Cardiopulmonary;;   BRONCHIAL BRUSHINGS  02/10/2022   Procedure: BRONCHIAL BRUSHINGS;  Surgeon: Oretha Milch, MD;  Location: [facility];  Service: Cardiopulmonary;;   BRONCHIAL NEEDLE ASPIRATION BIOPSY  02/10/2022   Procedure: BRONCHIAL NEEDLE ASPIRATION BIOPSIES;  Surgeon: Oretha Milch, MD;  Location: [facility];  Service: Cardiopulmonary;;   BRONCHIAL WASHINGS  02/10/2022   Procedure: BRONCHIAL WASHINGS;  Surgeon: Oretha Milch, MD;  Location: [facility];  Service: Cardiopulmonary;;   ENDOBRONCHIAL ULTRASOUND Bilateral 02/10/2022   Procedure: ENDOBRONCHIAL ULTRASOUND;  Surgeon: Oretha Milch, MD;  Location: [facility];  Service: Cardiopulmonary;  Laterality: Bilateral;   LEG SURGERY     VIDEO BRONCHOSCOPY  02/10/2022   Procedure: VIDEO BRONCHOSCOPY WITHOUT FLUORO;  Surgeon: Oretha Milch, MD;  Location: [facility];  Service: Cardiopulmonary;;    REVIEW OF SYSTEMS:  Constitutional: positive for fatigue Eyes: negative Ears, nose, mouth, throat, and face: negative Respiratory: positive for dyspnea on exertion Cardiovascular: negative Gastrointestinal: negative Genitourinary:negative Integument/breast: negative Hematologic/lymphatic: negative Musculoskeletal:negative Neurological: negative Behavioral/Psych: negative Endocrine: negative Allergic/Immunologic: negative   PHYSICAL EXAMINATION: General appearance: alert, cooperative, fatigued, and no distress Head: Normocephalic, without obvious abnormality, atraumatic Neck: no adenopathy, no JVD, supple, symmetrical, trachea midline, and thyroid not enlarged, symmetric, no tenderness/mass/nodules Lymph nodes: Cervical, supraclavicular, and axillary nodes normal. Resp: clear to auscultation bilaterally Back: symmetric, no curvature. ROM normal. No CVA tenderness. Cardio: regular rate and rhythm, S1, S2 normal, no murmur, click, rub or gallop GI: soft, non-tender; bowel sounds normal; no masses,  no organomegaly Extremities: extremities normal, atraumatic, no cyanosis or edema Neurologic: Alert and oriented X 3, normal strength and tone. Normal symmetric reflexes. Normal coordination and gait  ECOG PERFORMANCE STATUS: 1 - Symptomatic but completely ambulatory  Blood pressure (!) 165/119, pulse 94, temperature 98 F (36.7 C), temperature source Oral, resp. rate 17, height 5\' 9"  (1.753 m), weight 192 lb (87.1 kg), SpO2 (!) 85%.  LABORATORY DATA: Lab Results  Component Value Date   WBC 4.5 08/18/2023   HGB 18.1 (H) 08/18/2023   HCT 54.0 (H) 08/18/2023   MCV 95.4 08/18/2023   PLT 212 08/18/2023      Chemistry      Component Value Date/Time   NA 141 08/03/2023 0916   K 4.1 08/03/2023 0916   CL 107 08/03/2023 0916   CO2 28 08/03/2023 0916   BUN 12 08/03/2023 0916   CREATININE 0.93 08/03/2023 0916   CREATININE  0.91 12/05/2013 1605      Component Value Date/Time   CALCIUM 9.5 08/03/2023 0916   ALKPHOS 73 08/03/2023 0916   AST 13 (L) 08/03/2023 0916   ALT 11 08/03/2023 0916   BILITOT 0.6 08/03/2023 0916       RADIOGRAPHIC STUDIES: No results found.  ASSESSMENT AND PLAN: This is a very pleasant 52 years old African-American male with stage IIIb (T4, N2, M0) non-small cell lung cancer, poorly differentiated carcinoma with neuroendocrine features diagnosed in January 2024 and presented with large right lower lobe lung mass in addition to right hilar and mediastinal lymphadenopathy.  He is status post short course of palliative radiotherapy with 30 Wallace Cullens completed in March 2023. He completed concurrent chemoradiation with weekly carboplatin for AUC of 2 1 paclitaxel 45 Mg/M2 status post 4 cycles.  He was noncompliant with his chemotherapy treatments and received only 3 out of the schedule 7 cycles.  Last cycle was given on March 15, 2023. His scan showed decrease in the size of the right upper lung retrohilar mass as well as decrease in the size of the AP window lymph nodes and no evidence for progression. The patient is currently on treatment with consolidation immunotherapy with Imfinzi 1500 Mg IV every 4 weeks.  He is status post 2 cycle. The patient has been off treatment  since June 2024 because he missed a lot of appointments. The patient is here today to resume his treatment with immunotherapy.  I recommended for him to proceed with cycle #3 today as planned. I will see him back for follow-up visit in 4 weeks for evaluation with repeat CT scan of the chest for restaging of his disease. For the hypertension he was strongly advised to take his blood pressure medication as prescribed.  I will give him a dose of clonidine 0.2 mg in the clinic today. For the shortness of breath and COPD, he was advised to stay with home oxygen and to bring it to the clinic when he comes next time.  We will provide with  oxygen during his stay in the clinic. The patient was advised to call immediately if he has any other concerning symptoms in the interval. The patient voices understanding of current disease status and treatment options and is in agreement with the current care plan.  All questions were answered. The patient knows to call the clinic with any problems, questions or concerns. We can certainly see the patient much sooner if necessary.  The total time spent in the appointment was 30 minutes.  Disclaimer: This note was dictated with voice recognition software. Similar sounding words can inadvertently be transcribed and may not be corrected upon review.

## 2023-08-18 NOTE — Patient Instructions (Signed)
[facility]  Discharge Instructions: Thank you for choosing [facility] to provide your oncology and hematology care.   If you have a lab appointment with the [facility], please go directly to the [facility] and check in at the registration area.   Wear comfortable clothing and clothing appropriate for easy access to any Portacath or PICC line.   We strive to give you quality time with your provider. You may need to reschedule your appointment if you arrive late (15 or more minutes).  Arriving late affects you and other patients whose appointments are after yours.  Also, if you miss three or more appointments without notifying the office, you may be dismissed from the clinic at the provider's discretion.      For prescription refill requests, have your pharmacy contact our office and allow 72 hours for refills to be completed.    Today you received the following chemotherapy and/or immunotherapy agents: Imfinzi      To help prevent nausea and vomiting after your treatment, we encourage you to take your nausea medication as directed.  BELOW ARE SYMPTOMS THAT SHOULD BE REPORTED IMMEDIATELY: *FEVER GREATER THAN 100.4 F (38 C) OR HIGHER *CHILLS OR SWEATING *NAUSEA AND VOMITING THAT IS NOT CONTROLLED WITH YOUR NAUSEA MEDICATION *UNUSUAL SHORTNESS OF BREATH *UNUSUAL BRUISING OR BLEEDING *URINARY PROBLEMS (pain or burning when urinating, or frequent urination) *BOWEL PROBLEMS (unusual diarrhea, constipation, pain near the anus) TENDERNESS IN MOUTH AND THROAT WITH OR WITHOUT PRESENCE OF ULCERS (sore throat, sores in mouth, or a toothache) UNUSUAL RASH, SWELLING OR PAIN  UNUSUAL VAGINAL DISCHARGE OR ITCHING   Items with * indicate a potential emergency and should be followed up as soon as possible or go to the Emergency Department if any problems should occur.  Please show the CHEMOTHERAPY ALERT CARD or IMMUNOTHERAPY ALERT CARD at  check-in to the Emergency Department and triage nurse.  Should you have questions after your visit or need to cancel or reschedule your appointment, please contact [facility]  Dept: [phone]  and follow the prompts.  Office hours are 8:00 a.m. to 4:30 p.m. Monday - Friday. Please note that voicemails left after 4:00 p.m. may not be returned until the following business day.  We are closed weekends and major holidays. You have access to a nurse at all times for urgent questions. Please call the main number to the clinic Dept: [phone] and follow the prompts.   For any non-urgent questions, you may also contact your provider using MyChart. We now offer e-Visits for anyone 71 and older to request care online for non-urgent symptoms. For details visit [email or web address].   Also download the MyChart app! Go to the app store, search "MyChart", open the app, select [facility], and log in with your MyChart username and password.

## 2023-08-18 NOTE — Progress Notes (Signed)
Nutrition Follow-up:  Patient with stage IIIb non small cell lung cancer. S/p palliative radiation (March 2023) followed by 4 cycles systemic chemotherapy concurrent with radiation. He is currently receiving immunotherapy with imfinzi q4w (start 5/16). Pt has completed 2 cycles (last therapy in June d/t missed appointments).  Met with pt in infusion. Pt reports increased work of breathing and cough. He is hoping restarting immunotherapy will help. Pt is wearing supplemental oxygen at visit. Pt reports wearing this at home intermittently. States he takes it off after a couple of hours to push himself. Pt reports appetite is poor due to constantly coughing and shortness of breath. He is unable to afford Ensure. Pt denies nausea, vomiting, diarrhea, constipation.     Medications: reviewed  Labs: reviewed   Anthropometrics: Wt 192 lb today - trending down  8/20 - 193 lb 7/13 - 200 lb 6/16 - 200 lb  5/16 - 205 lb 12 oz    NUTRITION DIAGNOSIS: Food and nutrition related knowledge deficit continues    INTERVENTION:  Suggested pt wear supplemental oxygen when preparing meals and completing ADL Encouraged small more frequent meals and choosing soft moist high protein foods for ease of intake - handout with ideas provided Encouraged Ensure Complete/equivalent daily as able - samples + coupons provided Support and encouragement    MONITORING, EVALUATION, GOAL: wt trends, intake   NEXT VISIT: Wednesday October 2 during infusion

## 2023-08-19 LAB — T4: T4, Total: 7 ug/dL (ref 4.5–12.0)

## 2023-08-20 ENCOUNTER — Other Ambulatory Visit: Payer: Self-pay

## 2023-08-23 ENCOUNTER — Encounter: Payer: Self-pay | Admitting: Internal Medicine

## 2023-08-26 DIAGNOSIS — R531 Weakness: Secondary | ICD-10-CM | POA: Diagnosis not present

## 2023-08-27 ENCOUNTER — Encounter: Payer: Self-pay | Admitting: Internal Medicine

## 2023-08-31 ENCOUNTER — Other Ambulatory Visit: Payer: Self-pay | Admitting: Family Medicine

## 2023-08-31 DIAGNOSIS — R6510 Systemic inflammatory response syndrome (SIRS) of non-infectious origin without acute organ dysfunction: Secondary | ICD-10-CM

## 2023-08-31 DIAGNOSIS — R55 Syncope and collapse: Secondary | ICD-10-CM

## 2023-09-01 ENCOUNTER — Encounter: Payer: Self-pay | Admitting: Internal Medicine

## 2023-09-06 ENCOUNTER — Encounter: Payer: Self-pay | Admitting: Internal Medicine

## 2023-09-07 ENCOUNTER — Encounter: Payer: Self-pay | Admitting: Internal Medicine

## 2023-09-08 ENCOUNTER — Telehealth: Payer: Self-pay | Admitting: Medical Oncology

## 2023-09-08 ENCOUNTER — Inpatient Hospital Stay: Payer: 59

## 2023-09-08 ENCOUNTER — Ambulatory Visit (HOSPITAL_COMMUNITY): Admission: RE | Admit: 2023-09-08 | Payer: 59 | Source: Ambulatory Visit

## 2023-09-08 ENCOUNTER — Other Ambulatory Visit: Payer: Self-pay | Admitting: Medical Oncology

## 2023-09-08 NOTE — Progress Notes (Signed)
Pt will r/s his CT scan.

## 2023-09-08 NOTE — Telephone Encounter (Signed)
Pt confirmed next appts.

## 2023-09-08 NOTE — Telephone Encounter (Signed)
Hypertension-Called pt with BP update. He said he saw Dr. Clelia Croft today who increased his Clonidine to tid. Lyan said he just took his second dose -He stated his BP =157/126. He said Dr Clelia Croft will be calling him back soon.   I gave pt number to call and r/s his CT scan. He will call and let us know the new date.He will call for transportation to set up his [facility] Niece transportation to the appts. Marland Kitchen

## 2023-09-14 ENCOUNTER — Encounter: Payer: Self-pay | Admitting: Internal Medicine

## 2023-09-15 ENCOUNTER — Other Ambulatory Visit: Payer: Self-pay

## 2023-09-15 ENCOUNTER — Inpatient Hospital Stay: Payer: 59 | Admitting: Internal Medicine

## 2023-09-15 ENCOUNTER — Inpatient Hospital Stay: Payer: 59

## 2023-09-15 ENCOUNTER — Inpatient Hospital Stay: Payer: 59 | Admitting: Dietician

## 2023-09-15 ENCOUNTER — Telehealth: Payer: Self-pay | Admitting: Medical Oncology

## 2023-09-15 NOTE — Telephone Encounter (Signed)
Pt notified that his appts are cancelled today -he needs CT tomorrow. HE voiced understanding and said he will cancel  his [name] Niece.

## 2023-09-16 ENCOUNTER — Other Ambulatory Visit: Payer: Self-pay

## 2023-09-16 ENCOUNTER — Ambulatory Visit (HOSPITAL_COMMUNITY): Payer: 59 | Attending: Internal Medicine

## 2023-09-16 ENCOUNTER — Emergency Department (HOSPITAL_COMMUNITY): Payer: 59

## 2023-09-16 ENCOUNTER — Inpatient Hospital Stay: Payer: 59 | Attending: Internal Medicine

## 2023-09-16 ENCOUNTER — Encounter (HOSPITAL_COMMUNITY): Payer: Self-pay

## 2023-09-16 ENCOUNTER — Observation Stay (HOSPITAL_COMMUNITY)
Admission: EM | Admit: 2023-09-16 | Discharge: 2023-09-17 | Disposition: A | Discharge status: Home / Self Care | Payer: 59 | Attending: Student in an Organized Health Care Education/Training Program | Admitting: Student in an Organized Health Care Education/Training Program

## 2023-09-16 DIAGNOSIS — R0902 Hypoxemia: Secondary | ICD-10-CM

## 2023-09-16 DIAGNOSIS — Z8672 Personal history of thrombophlebitis: Secondary | ICD-10-CM

## 2023-09-16 DIAGNOSIS — C3492 Malignant neoplasm of unspecified part of left bronchus or lung: Secondary | ICD-10-CM

## 2023-09-16 DIAGNOSIS — Z86718 Personal history of other venous thrombosis and embolism: Secondary | ICD-10-CM | POA: Insufficient documentation

## 2023-09-16 DIAGNOSIS — Z8673 Personal history of transient ischemic attack (TIA), and cerebral infarction without residual deficits: Secondary | ICD-10-CM

## 2023-09-16 DIAGNOSIS — J9611 Chronic respiratory failure with hypoxia: Secondary | ICD-10-CM | POA: Diagnosis present

## 2023-09-16 DIAGNOSIS — Z1152 Encounter for screening for COVID-19: Secondary | ICD-10-CM | POA: Insufficient documentation

## 2023-09-16 DIAGNOSIS — I7121 Aneurysm of the ascending aorta, without rupture: Secondary | ICD-10-CM | POA: Diagnosis not present

## 2023-09-16 DIAGNOSIS — Z7982 Long term (current) use of aspirin: Secondary | ICD-10-CM | POA: Diagnosis not present

## 2023-09-16 DIAGNOSIS — J9601 Acute respiratory failure with hypoxia: Secondary | ICD-10-CM | POA: Diagnosis present

## 2023-09-16 DIAGNOSIS — R0600 Dyspnea, unspecified: Secondary | ICD-10-CM | POA: Diagnosis not present

## 2023-09-16 DIAGNOSIS — J9600 Acute respiratory failure, unspecified whether with hypoxia or hypercapnia: Secondary | ICD-10-CM | POA: Diagnosis present

## 2023-09-16 DIAGNOSIS — Z79899 Other long term (current) drug therapy: Secondary | ICD-10-CM | POA: Insufficient documentation

## 2023-09-16 DIAGNOSIS — J189 Pneumonia, unspecified organism: Secondary | ICD-10-CM | POA: Diagnosis not present

## 2023-09-16 DIAGNOSIS — Z87898 Personal history of other specified conditions: Secondary | ICD-10-CM

## 2023-09-16 DIAGNOSIS — J449 Chronic obstructive pulmonary disease, unspecified: Secondary | ICD-10-CM | POA: Diagnosis not present

## 2023-09-16 DIAGNOSIS — C3481 Malignant neoplasm of overlapping sites of right bronchus and lung: Secondary | ICD-10-CM | POA: Diagnosis not present

## 2023-09-16 DIAGNOSIS — F17210 Nicotine dependence, cigarettes, uncomplicated: Secondary | ICD-10-CM | POA: Diagnosis not present

## 2023-09-16 DIAGNOSIS — F17200 Nicotine dependence, unspecified, uncomplicated: Secondary | ICD-10-CM | POA: Diagnosis present

## 2023-09-16 DIAGNOSIS — R0602 Shortness of breath: Secondary | ICD-10-CM | POA: Diagnosis not present

## 2023-09-16 DIAGNOSIS — I10 Essential (primary) hypertension: Secondary | ICD-10-CM | POA: Diagnosis present

## 2023-09-16 DIAGNOSIS — J45909 Unspecified asthma, uncomplicated: Secondary | ICD-10-CM | POA: Insufficient documentation

## 2023-09-16 DIAGNOSIS — C3480 Malignant neoplasm of overlapping sites of unspecified bronchus and lung: Secondary | ICD-10-CM

## 2023-09-16 DIAGNOSIS — R918 Other nonspecific abnormal finding of lung field: Secondary | ICD-10-CM | POA: Diagnosis not present

## 2023-09-16 DIAGNOSIS — J9621 Acute and chronic respiratory failure with hypoxia: Secondary | ICD-10-CM | POA: Diagnosis not present

## 2023-09-16 LAB — I-STAT VENOUS BLOOD GAS, ED
Acid-base deficit: 5 mmol/L — ABNORMAL HIGH (ref 0.0–2.0)
Bicarbonate: 22 mmol/L (ref 20.0–28.0)
Calcium, Ion: 1.2 mmol/L (ref 1.15–1.40)
HCT: 58 % — ABNORMAL HIGH (ref 39.0–52.0)
Hemoglobin: 19.7 g/dL — ABNORMAL HIGH (ref 13.0–17.0)
O2 Saturation: 74 %
Potassium: 4.3 mmol/L (ref 3.5–5.1)
Sodium: 140 mmol/L (ref 135–145)
TCO2: 23 mmol/L (ref 22–32)
pCO2, Ven: 46.1 mm[Hg] (ref 44–60)
pH, Ven: 7.287 (ref 7.25–7.43)
pO2, Ven: 44 mm[Hg] (ref 32–45)

## 2023-09-16 LAB — I-STAT CHEM 8, ED
BUN: 11 mg/dL (ref 6–20)
Calcium, Ion: 1.14 mmol/L — ABNORMAL LOW (ref 1.15–1.40)
Chloride: 106 mmol/L (ref 98–111)
Creatinine, Ser: 1 mg/dL (ref 0.61–1.24)
Glucose, Bld: 83 mg/dL (ref 70–99)
HCT: 58 % — ABNORMAL HIGH (ref 39.0–52.0)
Hemoglobin: 19.7 g/dL — ABNORMAL HIGH (ref 13.0–17.0)
Potassium: 4.3 mmol/L (ref 3.5–5.1)
Sodium: 141 mmol/L (ref 135–145)
TCO2: 22 mmol/L (ref 22–32)

## 2023-09-16 LAB — CBC
HCT: 54.2 % — ABNORMAL HIGH (ref 39.0–52.0)
Hemoglobin: 17.7 g/dL — ABNORMAL HIGH (ref 13.0–17.0)
MCH: 31.1 pg (ref 26.0–34.0)
MCHC: 32.7 g/dL (ref 30.0–36.0)
MCV: 95.1 fL (ref 80.0–100.0)
Platelets: 210 10*3/uL (ref 150–400)
RBC: 5.7 MIL/uL (ref 4.22–5.81)
RDW: 14.6 % (ref 11.5–15.5)
WBC: 6.9 10*3/uL (ref 4.0–10.5)
nRBC: 0 % (ref 0.0–0.2)

## 2023-09-16 LAB — BASIC METABOLIC PANEL
Anion gap: 17 — ABNORMAL HIGH (ref 5–15)
BUN: 11 mg/dL (ref 6–20)
CO2: 20 mmol/L — ABNORMAL LOW (ref 22–32)
Calcium: 9.3 mg/dL (ref 8.9–10.3)
Chloride: 102 mmol/L (ref 98–111)
Creatinine, Ser: 1.01 mg/dL (ref 0.61–1.24)
GFR, Estimated: >60 mL/min (ref >60)
Glucose, Bld: 84 mg/dL (ref 70–99)
Potassium: 4.3 mmol/L (ref 3.5–5.1)
Sodium: 139 mmol/L (ref 135–145)

## 2023-09-16 LAB — SARS CORONAVIRUS 2 BY RT PCR: SARS Coronavirus 2 by RT PCR: NEGATIVE

## 2023-09-16 MED ORDER — ONDANSETRON HCL 4 MG/2ML IJ SOLN
4.0000 mg | Freq: Four times a day (QID) | INTRAMUSCULAR | Status: DC | PRN
  Administered 2023-09-16: 4 mg via INTRAVENOUS
  Filled 2023-09-16: qty 2

## 2023-09-16 MED ORDER — ONDANSETRON HCL 4 MG/2ML IJ SOLN: 4.0000 mg | Freq: Four times a day (QID) | INTRAMUSCULAR | Status: DC | PRN

## 2023-09-16 MED ORDER — IPRATROPIUM-ALBUTEROL 0.5-2.5 (3) MG/3ML IN SOLN
RESPIRATORY_TRACT | Status: AC
  Filled 2023-09-16: qty 3

## 2023-09-16 MED ORDER — LEVETIRACETAM 500 MG PO TABS
500.0000 mg | ORAL_TABLET | Freq: Two times a day (BID) | ORAL | Status: DC
  Administered 2023-09-17 (×2): 500 mg via ORAL
  Filled 2023-09-16 (×2): qty 1

## 2023-09-16 MED ORDER — DM-GUAIFENESIN ER 30-600 MG PO TB12
1.0000 | ORAL_TABLET | Freq: Two times a day (BID) | ORAL | Status: DC
  Administered 2023-09-17 (×2): 1 via ORAL
  Filled 2023-09-16 (×3): qty 1

## 2023-09-16 MED ORDER — METHYLPREDNISOLONE SODIUM SUCC 40 MG IJ SOLR
40.0000 mg | Freq: Once | INTRAMUSCULAR | Status: AC
  Administered 2023-09-17: 40 mg via INTRAVENOUS
  Filled 2023-09-16: qty 1

## 2023-09-16 MED ORDER — DOXYCYCLINE HYCLATE 100 MG PO TABS
100.0000 mg | ORAL_TABLET | Freq: Once | ORAL | Status: AC
  Administered 2023-09-16: 100 mg via ORAL
  Filled 2023-09-16: qty 1

## 2023-09-16 MED ORDER — METHYLPREDNISOLONE SODIUM SUCC 125 MG IJ SOLR: 80.0000 mg | Freq: Every day | INTRAMUSCULAR | Status: DC

## 2023-09-16 MED ORDER — HEPARIN SODIUM (PORCINE) 5000 UNIT/ML IJ SOLN: 5000.0000 [IU] | Freq: Three times a day (TID) | INTRAMUSCULAR | Status: DC

## 2023-09-16 MED ORDER — ASPIRIN 81 MG PO CHEW: 81.0000 mg | CHEWABLE_TABLET | Freq: Every morning | ORAL | Status: DC

## 2023-09-16 MED ORDER — NICOTINE 14 MG/24HR TD PT24
14.0000 mg | MEDICATED_PATCH | Freq: Every day | TRANSDERMAL | Status: DC
  Administered 2023-09-16 – 2023-09-17 (×2): 14 mg via TRANSDERMAL
  Filled 2023-09-16 (×2): qty 1

## 2023-09-16 MED ORDER — IOHEXOL 350 MG/ML SOLN
50.0000 mL | Freq: Once | INTRAVENOUS | Status: AC | PRN
  Administered 2023-09-16: 50 mL via INTRAVENOUS

## 2023-09-16 MED ORDER — FLUTICASONE FUROATE-VILANTEROL 100-25 MCG/ACT IN AEPB
1.0000 | INHALATION_SPRAY | Freq: Every day | RESPIRATORY_TRACT | Status: DC
  Administered 2023-09-17: 1 via RESPIRATORY_TRACT
  Filled 2023-09-16: qty 28

## 2023-09-16 MED ORDER — ALBUTEROL SULFATE (2.5 MG/3ML) 0.083% IN NEBU
2.5000 mg | INHALATION_SOLUTION | RESPIRATORY_TRACT | Status: DC | PRN
  Administered 2023-09-16 – 2023-09-17 (×2): 2.5 mg via RESPIRATORY_TRACT
  Filled 2023-09-16 (×2): qty 3

## 2023-09-16 MED ORDER — BENZONATATE 100 MG PO CAPS: 100.0000 mg | ORAL_CAPSULE | Freq: Three times a day (TID) | ORAL | Status: DC | PRN

## 2023-09-16 MED ORDER — ONDANSETRON HCL 4 MG PO TABS: 4.0000 mg | ORAL_TABLET | Freq: Four times a day (QID) | ORAL | Status: DC | PRN

## 2023-09-16 MED ORDER — MORPHINE SULFATE (PF) 2 MG/ML IV SOLN
1.0000 mg | INTRAVENOUS | Status: DC | PRN
  Administered 2023-09-16: 1 mg via INTRAVENOUS
  Filled 2023-09-16: qty 1

## 2023-09-16 MED ORDER — AMLODIPINE BESYLATE 10 MG PO TABS
10.0000 mg | ORAL_TABLET | Freq: Every morning | ORAL | Status: DC
  Administered 2023-09-17: 10 mg via ORAL
  Filled 2023-09-16: qty 1

## 2023-09-16 MED ORDER — SODIUM CHLORIDE 0.9 % IV SOLN
1.0000 g | Freq: Once | INTRAVENOUS | Status: AC
  Administered 2023-09-16: 1 g via INTRAVENOUS
  Filled 2023-09-16: qty 10

## 2023-09-16 MED ORDER — LOSARTAN POTASSIUM 50 MG PO TABS
100.0000 mg | ORAL_TABLET | Freq: Every day | ORAL | Status: DC
  Administered 2023-09-17 (×2): 100 mg via ORAL
  Filled 2023-09-16 (×2): qty 2

## 2023-09-16 MED ORDER — SENNOSIDES-DOCUSATE SODIUM 8.6-50 MG PO TABS: 1.0000 | ORAL_TABLET | Freq: Every evening | ORAL | Status: DC | PRN

## 2023-09-16 MED ORDER — ACETAMINOPHEN 325 MG PO TABS
650.0000 mg | ORAL_TABLET | Freq: Four times a day (QID) | ORAL | Status: DC | PRN
  Administered 2023-09-17: 650 mg via ORAL
  Filled 2023-09-16 (×2): qty 2

## 2023-09-16 MED ORDER — UMECLIDINIUM BROMIDE 62.5 MCG/ACT IN AEPB
1.0000 | INHALATION_SPRAY | Freq: Every day | RESPIRATORY_TRACT | Status: DC
  Administered 2023-09-17: 1 via RESPIRATORY_TRACT
  Filled 2023-09-16: qty 7

## 2023-09-16 MED ORDER — SODIUM CHLORIDE 0.9 % IV SOLN
500.0000 mg | INTRAVENOUS | Status: DC
  Administered 2023-09-17: 500 mg via INTRAVENOUS
  Filled 2023-09-16: qty 5

## 2023-09-16 MED ORDER — ACETAMINOPHEN 650 MG RE SUPP: 650.0000 mg | Freq: Four times a day (QID) | RECTAL | Status: DC | PRN

## 2023-09-16 MED ORDER — SERTRALINE HCL 50 MG PO TABS
25.0000 mg | ORAL_TABLET | Freq: Every day | ORAL | Status: DC
  Administered 2023-09-17: 25 mg via ORAL
  Filled 2023-09-16: qty 1

## 2023-09-16 MED ORDER — SODIUM CHLORIDE 0.9 % IV SOLN
2.0000 g | INTRAVENOUS | Status: DC
  Administered 2023-09-17: 2 g via INTRAVENOUS
  Filled 2023-09-16: qty 20

## 2023-09-16 MED ORDER — IPRATROPIUM-ALBUTEROL 0.5-2.5 (3) MG/3ML IN SOLN
3.0000 mL | Freq: Once | RESPIRATORY_TRACT | Status: AC
  Administered 2023-09-16: 3 mL via RESPIRATORY_TRACT

## 2023-09-16 NOTE — H&P (Signed)
PCP:   Pcp, No   Chief Complaint:  Shortness of breath  HPI: This is a 52 year old male with history of chronic respiratory failure, stage IIIb NSCLC, COPD, ongoing tobacco use, h/o seizures, DVT, and intracranial bleed.  Patient s/p palliative radiotherapy 02/2022.  Chemotherapy with carboplatin and paclitaxel initiated.  Patient noncompliant with chemotherapy.  Patient currently on immunotherapy.  Patient is a scant historian.  He presents to the ER with complaint of issues with breathing.  He states he cannot move too far without losing his breath.  He endorses wheezing and a mild fleeting right-sided chest pain.  This started approximately 2 to 3 days ago.  He denies fevers or chills.   In the ER patient's presenting vitals 156/103, 110, 30, afebrile.  Respiratory panel negative.  CT chest shows changes consistent with medial left upper lobe infection.  A 4.5 cm ascending thoracic aneurysm also noted.  Patient is known lung cancer documented.  Patient on 6 L oxygen satting 92% in ER  Review of Systems:  Per HPI  Past Medical History: Past Medical History:  Diagnosis Date   Asthma    Brain bleed (HCC)    DVT (deep venous thrombosis) (HCC) 02/19/2015   RLE   GERD (gastroesophageal reflux disease)    History of home oxygen therapy    Hypertension    Seizures (HCC)    last episode 03/2013   Stroke [facility])    Past Surgical History:  Procedure Laterality Date   BRONCHIAL BIOPSY  02/10/2022   Procedure: BRONCHIAL BIOPSIES;  Surgeon: Oretha Milch, MD;  Location: [facility] ENDOSCOPY;  Service: Cardiopulmonary;;   BRONCHIAL BRUSHINGS  02/10/2022   Procedure: BRONCHIAL BRUSHINGS;  Surgeon: Oretha Milch, MD;  Location: [facility] ENDOSCOPY;  Service: Cardiopulmonary;;   BRONCHIAL NEEDLE ASPIRATION BIOPSY  02/10/2022   Procedure: BRONCHIAL NEEDLE ASPIRATION BIOPSIES;  Surgeon: Oretha Milch, MD;  Location: [facility];  Service: Cardiopulmonary;;   BRONCHIAL WASHINGS  02/10/2022   Procedure: BRONCHIAL  WASHINGS;  Surgeon: Oretha Milch, MD;  Location: [facility];  Service: Cardiopulmonary;;   ENDOBRONCHIAL ULTRASOUND Bilateral 02/10/2022   Procedure: ENDOBRONCHIAL ULTRASOUND;  Surgeon: Oretha Milch, MD;  Location: [facility] ENDOSCOPY;  Service: Cardiopulmonary;  Laterality: Bilateral;   LEG SURGERY     VIDEO BRONCHOSCOPY  02/10/2022   Procedure: VIDEO BRONCHOSCOPY WITHOUT FLUORO;  Surgeon: Oretha Milch, MD;  Location: [facility];  Service: Cardiopulmonary;;    Medications: Prior to Admission medications   Medication Sig Start Date End Date Taking? Authorizing Provider  albuterol (VENTOLIN HFA) 108 (90 Base) MCG/ACT inhaler Inhale 1-2 puffs into the lungs every 6 (six) hours as needed for wheezing or shortness of breath. 06/03/22   Parrett, Virgel Bouquet, NP  amLODipine (NORVASC) 10 MG tablet Take 10 mg by mouth every morning. 08/06/23   [provider]  ASPIRIN LOW DOSE 81 MG chewable tablet Chew 81 mg by mouth every morning. 08/10/23   [provider]  [name] 160-4.5 MCG/ACT inhaler Inhale 2 puffs into the lungs daily. 12/22/22   [provider]  cloNIDine (CATAPRES) 0.3 MG tablet Take 1 tablet (0.3 mg total) by mouth 2 (two) times daily. Patient not taking: Reported on 01/15/2023 02/11/22   Albertine Grates, MD  diclofenac Sodium (VOLTAREN) 1 % GEL Apply 4 g topically 4 (four) times daily. Patient not taking: Reported on 01/15/2023 01/12/23   Marrianne Mood, MD  hydrochlorothiazide (HYDRODIURIL) 12.5 MG tablet Take 12.5 mg by mouth every morning. 08/06/23   [provider]  levETIRAcetam (KEPPRA) 500 MG tablet Take 1 tablet (500 mg total) by mouth 2 (two) times daily. 01/17/23   Marrianne Mood, MD  losartan (COZAAR) 100 MG tablet Take 100 mg by mouth daily. 12/31/22   [provider]  OXYGEN Inhale 2 L/min into the lungs daily as needed (for shortness of breath).    [provider]  prochlorperazine (COMPAZINE) 10 MG tablet Take 1 tablet (10 mg total) by mouth  every 6 (six) hours as needed for nausea or vomiting. 01/20/23   Si Gaul, MD  sertraline (ZOLOFT) 25 MG tablet Take 25 mg by mouth daily. 07/23/23   [provider]  SPIRIVA RESPIMAT 2.5 MCG/ACT AERS Inhale into the lungs. 05/11/23   [provider]  TRELEGY ELLIPTA 100-62.5-25 MCG/ACT AEPB Take 1 puff by mouth daily. 07/22/23   [provider]    Allergies:   Allergies  Allergen Reactions   Dilaudid [Hydromorphone Hcl] Nausea Only and Other (See Comments)    Bradycardia and hyperthermia, too   Tape Itching and Dermatitis    Social History:  reports that he has been smoking cigarettes. He started smoking about 37 years ago. He has a 37.8 pack-year smoking history. He has never used smokeless tobacco. He reports current alcohol use. He reports that he does not currently use drugs after having used the following drugs: Cocaine.  Family History: Family History  Problem Relation Age of Onset   Cancer Father    Diabetes Mellitus II Sister     Physical Exam: Vitals:   09/16/23 1713 09/16/23 1720 09/16/23 1954 09/16/23 2030  BP: (!) 156/103  (!) 157/118 (!) 173/112  Pulse: (!) 110  98   Resp: (!) 30  17 (!) 28  Temp:  98.2 F (36.8 C) 98.7 F (37.1 C)   TempSrc:  Oral    SpO2: 90%  98%   Weight: 88.5 kg     Height: 5\' 9"  (1.753 m)       General: A and O x 3, well developed and nourished, no acute distress Eyes: Pink conjunctiva, no scleral icterus ENT: Moist oral mucosa, neck supple, no thyromegaly Lungs: Rhonchi and wheezing moderate Cardiovascular: Tachycardia, RRR, no murmurs. No carotid bruits, no JVD Abdomen: soft, positive BS, NTND  GU: not examined Neuro: CN II - XII grossly intact, sensation intact Musculoskeletal: strength 5/5 all extremities, no edema Skin: no rash, no subcutaneous crepitation, no decubitus Psych: appropriate patient  Labs on Admission:  Recent Labs    09/16/23 1823 09/16/23 1831 09/16/23 1832  NA 139 140 141   K 4.3 4.3 4.3  CL 102  --  106  CO2 20*  --   --   GLUCOSE 84  --  83  BUN 11  --  11  CREATININE 1.01  --  1.00  CALCIUM 9.3  --   --     Recent Labs    09/16/23 1823 09/16/23 1831 09/16/23 1832  WBC 6.9  --   --   HGB 17.7* 19.7* 19.7*  HCT 54.2* 58.0* 58.0*  MCV 95.1  --   --   PLT 210  --   --      Micro Results: Recent Results (from the past 240 hour(s))  SARS Coronavirus 2 by RT PCR (hospital order, performed in [facility] hospital lab) *cepheid single result test* Anterior Nasal Swab     Status: None   Collection Time: 09/16/23  5:50 PM   Specimen: Anterior Nasal Swab  Result Value Ref  Range Status   SARS Coronavirus 2 by RT PCR NEGATIVE NEGATIVE Final    Comment: Performed at [facility], [address], [place], [place] [address]    Radiological Exams on Admission: CT Chest W Contrast  Result Date: 09/16/2023 CLINICAL DATA:  History of right upper lobe non-small cell lung cancer. Shortness of breath. * Tracking Code: BO * EXAM: CT CHEST WITH CONTRAST TECHNIQUE: Multidetector CT imaging of the chest was performed during intravenous contrast administration. RADIATION DOSE REDUCTION: This exam was performed according to the departmental dose-optimization program which includes automated exposure control, adjustment of the mA and/or kV according to patient size and/or use of iterative reconstruction technique. CONTRAST:  50mL OMNIPAQUE IOHEXOL 350 MG/ML SOLN COMPARISON:  CT chest dated 04/19/2023 multiple priors. FINDINGS: Cardiovascular: Normal heart size. No significant pericardial fluid/thickening. Ascending thoracic aorta measures 4.5 x 4.4 cm, previously 4.0 cm. No central pulmonary emboli. Mediastinum/Nodes: Imaged thyroid gland without nodules meeting criteria for imaging follow-up by size. Normal esophagus. Unchanged 10 mm subcarinal lymph node (2:57). Multiple additional prominent mediastinal lymph nodes are also unchanged. Lungs/Pleura: The central airways  are patent. Right middle and bilateral lower lobe subsegmental mucous plugging. Upper lobe predominant centrilobular emphysema. Continued decrease in size of posterior right upper lobe mass measuring 4.6 x 2.9 cm (2:52), previously 5.2 x 3.0 cm. Unchanged 8 x 8 mm irregular right upper lobe nodule (3:43). Similar encasement of the right hilum. New tree-in-bud nodules along the medial left upper lobe. No pneumothorax. No pleural effusion. Upper abdomen: Unchanged thickening of the left adrenal gland. Musculoskeletal: No acute or abnormal lytic or blastic osseous lesions. Multilevel degenerative changes of the thoracic spine. IMPRESSION: 1. Continued decrease in size of posterior right upper lobe mass measuring 4.6 x 2.9 cm, previously 5.2 x 3.0 cm. Unchanged 8 x 8 mm irregular right upper lobe nodule. 2. New tree-in-bud nodules along the medial left upper lobe, likely infectious/inflammatory. 3. Unchanged prominent mediastinal lymph nodes. 4. Ascending thoracic aortic aneurysm measures 4.5 cm. Ascending thoracic aortic aneurysm. Recommend semi-annual imaging followup by CTA or MRA and referral to cardiothoracic surgery if not already obtained. This recommendation follows 2010 ACCF/AHA/AATS/ACR/ASA/SCA/SCAI/SIR/STS/SVM Guidelines for the Diagnosis and Management of Patients With Thoracic Aortic Disease. Circulation. 2010; 121: Y403-K742. Aortic aneurysm NOS (ICD10-I71.9) 5.  Emphysema (ICD10-J43.9). Electronically Signed   By: Agustin Cree M.D.   On: 09/16/2023 20:18   DG Chest Port 1 View  Result Date: 09/16/2023 CLINICAL DATA:  Dyspnea. EXAM: PORTABLE CHEST 1 VIEW COMPARISON:  June 26, 2023 FINDINGS: The cardiac silhouette is mildly enlarged and unchanged in size. The lungs are mildly hyperinflated. A stable appearing right suprahilar opacity is seen consistent with the patient's known retro hilar mass. Mild, stable right upper lobe linear scarring and/or atelectasis is noted. No pleural effusion or pneumothorax is  identified. The visualized skeletal structures are unremarkable. IMPRESSION: 1. Findings consistent with the patient's known right-sided retro hilar mass. 2. Mild, stable right upper lobe linear scarring and/or atelectasis. Electronically Signed   By: Aram Candela M.D.   On: 09/16/2023 19:27    Assessment/Plan Present on Admission:  Obstructive pneumonia  Acute on chronic respiratory failure with hypoxia (HCC) -Currently on 9L [place] sating 92% -Blood cultues ordered -PNA orderset intiated -Respiratory consult on board.  Oxygen to keep sats >88% -Albuterol nebs and Tessalon Perles PRN -Mucinex DM every 12 -Incentive spirometer ordered -Patient attempted to leave AMA overnight then decided to stay.   Acute COPD exacerbation -IV Solu-Medrol, oxygen -Nebulizers  as needed   Essential hypertension -HCTZ, Norvasc, losartan resumed   Malignant neoplasm of overlapping sites of right lung (HCC) -Per oncology   Tobacco abuse -Nicotine patch ordered   H/o seizures -Keppra resumed   Ascending thoracic aneurysm -4.5cm. Biannual f/u recommended   H/o intracranial bleed  Orby Tangen 09/16/2023, 10:17 PM

## 2023-09-16 NOTE — ED Provider Notes (Signed)
[place] [facility]ERGENCY DEPARTMENT AT [facility] Provider Note   CSN: [id number] Arrival date & time: 09/16/23  1705     History  Chief Complaint  Patient presents with   Shortness of Breath    Cashis Castilla is a 52 y.o. male.  HPI 52 year old male smoker, stage IIIb non-small cell lung cancer diagnosed January 2024 with large right lower lobe lung mass in addition to hilar and mediastinal adenopathy who presents today with low oxygen saturations.  He reports that this occurred when he was out without his oxygen.  However, patient does endorse increased cough, nasal congestion and some sore throat.  Reviewed interval history note from oncology states that he was seen 08/18/2023 at that time his oxygen saturation was 85% as he had presented without his oxygen on at that time they plan to completion of his chemoradiation which she had been off of since June 2024 due to missing "a lot of appointments" he was started on clonidine for high blood pressure.     Home Medications Prior to Admission medications   Medication Sig Start Date End Date Taking? Authorizing Provider  albuterol (VENTOLIN HFA) 108 (90 Base) MCG/ACT inhaler Inhale 1-2 puffs into the lungs every 6 (six) hours as needed for wheezing or shortness of breath. 06/03/22  Yes Parrett, Tammy S, NP  amLODipine (NORVASC) 10 MG tablet Take 10 mg by mouth every morning. 08/06/23  Yes [provider]  ASPIRIN LOW DOSE 81 MG chewable tablet Chew 81 mg by mouth every morning. 08/10/23  Yes [provider]  [name] 160-4.5 MCG/ACT inhaler Inhale 2 puffs into the lungs daily. 12/22/22  Yes [provider]  cloNIDine (CATAPRES) 0.1 MG tablet Take 0.1 mg by mouth. Take 2 tablets by mouth at 9am and 5pm and 1 tablet at bedtime   Yes [provider]  hydrochlorothiazide (HYDRODIURIL) 12.5 MG tablet Take 12.5 mg by mouth every morning. 08/06/23  Yes [provider]  levETIRAcetam (KEPPRA) 500 MG  tablet Take 1 tablet (500 mg total) by mouth 2 (two) times daily. 01/17/23  Yes Marrianne Mood, MD  losartan (COZAAR) 100 MG tablet Take 100 mg by mouth daily. 12/31/22  Yes [provider]  sertraline (ZOLOFT) 25 MG tablet Take 25 mg by mouth daily. 07/23/23  Yes [provider]  SPIRIVA RESPIMAT 2.5 MCG/ACT AERS Inhale into the lungs. 05/11/23  Yes [provider]  TRELEGY ELLIPTA 100-62.5-25 MCG/ACT AEPB Take 1 puff by mouth daily. 07/22/23  Yes [provider]  OXYGEN Inhale 2 L/min into the lungs daily as needed (for shortness of breath).    [provider]      Allergies    Dilaudid [hydromorphone hcl] and Tape    Review of Systems   Review of Systems  Physical Exam Updated Vital Signs BP (!) 173/112   Pulse 98   Temp 98.7 F (37.1 C)   Resp (!) 28   Ht 1.753 m (5\' 9" )   Wt 88.5 kg   SpO2 98%   BMI 28.80 kg/m  Physical Exam Vitals and nursing note reviewed.  HENT:     Head: Normocephalic.     Mouth/Throat:     Mouth: Mucous membranes are moist.  Eyes:     Pupils: Pupils are equal, round, and reactive to light.  Cardiovascular:     Rate and Rhythm: Normal rate and regular rhythm.  Pulmonary:     Effort: Tachypnea present.  Abdominal:     Palpations: Abdomen is  soft.  Musculoskeletal:        General: Normal range of motion.     Cervical back: Normal range of motion.  Skin:    General: Skin is warm and dry.     Capillary Refill: Capillary refill takes less than 2 seconds.  Neurological:     General: No focal deficit present.     Mental Status: He is alert.  Psychiatric:        Mood and Affect: Mood normal.     ED Results / Procedures / Treatments   Labs (all labs ordered are listed, but only abnormal results are displayed) Labs Reviewed  CBC - Abnormal; Notable for the following components:      Result Value   Hemoglobin 17.7 (*)    HCT 54.2 (*)    All other components within normal limits  BASIC METABOLIC PANEL  - Abnormal; Notable for the following components:   CO2 20 (*)    Anion gap 17 (*)    All other components within normal limits  I-STAT CHEM 8, ED - Abnormal; Notable for the following components:   Calcium, Ion 1.14 (*)    Hemoglobin 19.7 (*)    HCT 58.0 (*)    All other components within normal limits  I-STAT VENOUS BLOOD GAS, ED - Abnormal; Notable for the following components:   Acid-base deficit 5.0 (*)    HCT 58.0 (*)    Hemoglobin 19.7 (*)    All other components within normal limits  SARS CORONAVIRUS 2 BY RT PCR  CULTURE, BLOOD (ROUTINE X 2)  CULTURE, BLOOD (ROUTINE X 2)  BLOOD GAS, VENOUS    EKG EKG Interpretation Date/Time:  Thursday September 16 2023 17:18:59 EDT Ventricular Rate:  103 PR Interval:  186 QRS Duration:  78 QT Interval:  334 QTC Calculation: 437 R Axis:   63  Text Interpretation: Sinus tachycardia with Premature atrial complexes Biatrial enlargement Minimal voltage criteria for LVH, may be normal variant ( Sokolow-Lyon ) Early repolarization Abnormal ECG When compared with ECG of 31-Jul-2023 21:38, PREVIOUS ECG IS PRESENT No significant change since last tracing 31 July 2023 Confirmed by Margarita Grizzle [phone]) on 09/16/2023 5:27:53 PM  Radiology CT Chest W Contrast  Result Date: 09/16/2023 CLINICAL DATA:  History of right upper lobe non-small cell lung cancer. Shortness of breath. * Tracking Code: BO * EXAM: CT CHEST WITH CONTRAST TECHNIQUE: Multidetector CT imaging of the chest was performed during intravenous contrast administration. RADIATION DOSE REDUCTION: This exam was performed according to the departmental dose-optimization program which includes automated exposure control, adjustment of the mA and/or kV according to patient size and/or use of iterative reconstruction technique. CONTRAST:  50mL OMNIPAQUE IOHEXOL 350 MG/ML SOLN COMPARISON:  CT chest dated 04/19/2023 multiple priors. FINDINGS: Cardiovascular: Normal heart size. No significant  pericardial fluid/thickening. Ascending thoracic aorta measures 4.5 x 4.4 cm, previously 4.0 cm. No central pulmonary emboli. Mediastinum/Nodes: Imaged thyroid gland without nodules meeting criteria for imaging follow-up by size. Normal esophagus. Unchanged 10 mm subcarinal lymph node (2:57). Multiple additional prominent mediastinal lymph nodes are also unchanged. Lungs/Pleura: The central airways are patent. Right middle and bilateral lower lobe subsegmental mucous plugging. Upper lobe predominant centrilobular emphysema. Continued decrease in size of posterior right upper lobe mass measuring 4.6 x 2.9 cm (2:52), previously 5.2 x 3.0 cm. Unchanged 8 x 8 mm irregular right upper lobe nodule (3:43). Similar encasement of the right hilum. New tree-in-bud nodules along the medial left upper lobe. No pneumothorax. No pleural effusion.  Upper abdomen: Unchanged thickening of the left adrenal gland. Musculoskeletal: No acute or abnormal lytic or blastic osseous lesions. Multilevel degenerative changes of the thoracic spine. IMPRESSION: 1. Continued decrease in size of posterior right upper lobe mass measuring 4.6 x 2.9 cm, previously 5.2 x 3.0 cm. Unchanged 8 x 8 mm irregular right upper lobe nodule. 2. New tree-in-bud nodules along the medial left upper lobe, likely infectious/inflammatory. 3. Unchanged prominent mediastinal lymph nodes. 4. Ascending thoracic aortic aneurysm measures 4.5 cm. Ascending thoracic aortic aneurysm. Recommend semi-annual imaging followup by CTA or MRA and referral to cardiothoracic surgery if not already obtained. This recommendation follows 2010 ACCF/AHA/AATS/ACR/ASA/SCA/SCAI/SIR/STS/SVM Guidelines for the Diagnosis and Management of Patients With Thoracic Aortic Disease. Circulation. 2010; 121: I433-I951. Aortic aneurysm NOS (ICD10-I71.9) 5.  Emphysema (ICD10-J43.9). Electronically Signed   By: Agustin Cree M.D.   On: 09/16/2023 20:18   DG Chest Port 1 View  Result Date: 09/16/2023 CLINICAL  DATA:  Dyspnea. EXAM: PORTABLE CHEST 1 VIEW COMPARISON:  June 26, 2023 FINDINGS: The cardiac silhouette is mildly enlarged and unchanged in size. The lungs are mildly hyperinflated. A stable appearing right suprahilar opacity is seen consistent with the patient's known retro hilar mass. Mild, stable right upper lobe linear scarring and/or atelectasis is noted. No pleural effusion or pneumothorax is identified. The visualized skeletal structures are unremarkable. IMPRESSION: 1. Findings consistent with the patient's known right-sided retro hilar mass. 2. Mild, stable right upper lobe linear scarring and/or atelectasis. Electronically Signed   By: Aram Candela M.D.   On: 09/16/2023 19:27    Procedures .Critical Care  Performed by: Margarita Grizzle, MD Authorized by: Margarita Grizzle, MD   Critical care provider statement:    Critical care time (minutes):  60   Critical care was necessary to treat or prevent imminent or life-threatening deterioration of the following conditions:  Respiratory failure   Critical care was time spent personally by me on the following activities:  Development of treatment plan with patient or surrogate, discussions with consultants, evaluation of patient's response to treatment, examination of patient, ordering and review of laboratory studies, ordering and review of radiographic studies, ordering and performing treatments and interventions, pulse oximetry, re-evaluation of patient's condition and review of old charts     Medications Ordered in ED Medications  albuterol (PROVENTIL) (2.5 MG/3ML) 0.083% nebulizer solution 2.5 mg (2.5 mg Nebulization Given 09/16/23 2230)  ondansetron (ZOFRAN) injection 4 mg (4 mg Intravenous Given 09/16/23 2219)  morphine (PF) 2 MG/ML injection 1 mg (1 mg Intravenous Given 09/16/23 2220)  ipratropium-albuterol (DUONEB) 0.5-2.5 (3) MG/3ML nebulizer solution 3 mL (3 mLs Nebulization Given 09/16/23 1844)  iohexol (OMNIPAQUE) 350 MG/ML injection 50  mL (50 mLs Intravenous Contrast Given 09/16/23 1904)  cefTRIAXone (ROCEPHIN) 1 g in sodium chloride 0.9 % 100 mL IVPB (0 g Intravenous Stopped 09/16/23 2137)  doxycycline (VIBRA-TABS) tablet 100 mg (100 mg Oral Given 09/16/23 2052)    ED Course/ Medical Decision Making/ A&P Clinical Course as of 09/16/23 2300  Thu Sep 16, 2023  2127 CBC reviewed interpreted normal Basic metabolic panel reviewed interpreted significant for decreased CO2 at 20 otherwise within normal limits VBG 7.28 pCO2 46 pO2 44 [DR]  2128 CT chest reviewed and shows continued decrease in size of posterior right upper lobe mass but new tree-in-bud nodules on the medial left upper lobe likely infectious or inflammatory Send aortic aneurysm measures 4.5 recommended semiannual follow-up with CT or MRI [DR]    Clinical Course User Index [DR] Margarita Grizzle, MD  Medical Decision Making Amount and/or Complexity of Data Reviewed Labs: ordered. Radiology: ordered.  Risk Prescription drug management.   52 year old man known small cell lung cancer presents today with hypoxia.  Patient is oxygen dependent but continues to not use his oxygen regularly.  He endorses that he has been coughing and has baseline dyspnea.  He does not feel that any changes in oxygen or other treatments make any difference in his symptoms.  He is followed in oncology and has had a break in therapy but has restarted.  I reviewed their notes and they had advised him to have a CT of his chest today.  Secondary to this a CT was also obtained. Patient continues to be tachypneic and hypoxic despite 6 L of oxygen.  His sats have fluctuated from 89 to 91% while sitting in bed he remains tachypneic.  I suspect with the above CT findings he likely has a secondary pneumonia.  His COVID test is negative.  I discussed the need for admission with the patient.  He does not feel that he needs to be admitted.  I have discussed that he can be,  worse and is at risk for death.  Continues to state that he will go home.  He is treated here with Rocephin and doxycycline. Patient states he will stay in the hospital.  Discussed with Dr. Joneen Roach who will see for admission        Final Clinical Impression(s) / ED Diagnoses Final diagnoses:  Pneumonia of left lung due to infectious organism, unspecified part of lung  Hypoxia    Rx / DC Orders ED Discharge Orders     None         Margarita Grizzle, MD 09/16/23 2300

## 2023-09-16 NOTE — ED Triage Notes (Signed)
Pt arrived via ems to the er for the c/o sob/ pt was away from home and ran out of portable 02. Initial 02 68%. 100% on 6/L [place]. 186/120. Pulse 120. 40r.  Pt complains of a headache. Hx current lung cancer. Sob on arrival.

## 2023-09-17 DIAGNOSIS — J9621 Acute and chronic respiratory failure with hypoxia: Secondary | ICD-10-CM | POA: Diagnosis present

## 2023-09-17 DIAGNOSIS — J9600 Acute respiratory failure, unspecified whether with hypoxia or hypercapnia: Secondary | ICD-10-CM | POA: Diagnosis present

## 2023-09-17 DIAGNOSIS — R0902 Hypoxemia: Secondary | ICD-10-CM | POA: Diagnosis not present

## 2023-09-17 DIAGNOSIS — J189 Pneumonia, unspecified organism: Secondary | ICD-10-CM | POA: Diagnosis not present

## 2023-09-17 DIAGNOSIS — C3480 Malignant neoplasm of overlapping sites of unspecified bronchus and lung: Secondary | ICD-10-CM | POA: Diagnosis not present

## 2023-09-17 DIAGNOSIS — R0602 Shortness of breath: Secondary | ICD-10-CM | POA: Diagnosis not present

## 2023-09-17 LAB — CBC WITH DIFFERENTIAL/PLATELET
Abs Immature Granulocytes: 0.02 10*3/uL (ref 0.00–0.07)
Basophils Absolute: 0 10*3/uL (ref 0.0–0.1)
Basophils Relative: 0 %
Eosinophils Absolute: 0 10*3/uL (ref 0.0–0.5)
Eosinophils Relative: 0 %
HCT: 54 % — ABNORMAL HIGH (ref 39.0–52.0)
Hemoglobin: 17.6 g/dL — ABNORMAL HIGH (ref 13.0–17.0)
Immature Granulocytes: 0 %
Lymphocytes Relative: 4 %
Lymphs Abs: 0.3 10*3/uL — ABNORMAL LOW (ref 0.7–4.0)
MCH: 31.5 pg (ref 26.0–34.0)
MCHC: 32.6 g/dL (ref 30.0–36.0)
MCV: 96.8 fL (ref 80.0–100.0)
Monocytes Absolute: 0.3 10*3/uL (ref 0.1–1.0)
Monocytes Relative: 4 %
Neutro Abs: 6.4 10*3/uL (ref 1.7–7.7)
Neutrophils Relative %: 92 %
Platelets: 206 10*3/uL (ref 150–400)
RBC: 5.58 MIL/uL (ref 4.22–5.81)
RDW: 14.6 % (ref 11.5–15.5)
WBC: 6.9 10*3/uL (ref 4.0–10.5)
nRBC: 0 % (ref 0.0–0.2)

## 2023-09-17 LAB — BASIC METABOLIC PANEL
Anion gap: 10 (ref 5–15)
BUN: 11 mg/dL (ref 6–20)
CO2: 24 mmol/L (ref 22–32)
Calcium: 8.7 mg/dL — ABNORMAL LOW (ref 8.9–10.3)
Chloride: 102 mmol/L (ref 98–111)
Creatinine, Ser: 0.96 mg/dL (ref 0.61–1.24)
GFR, Estimated: >60 mL/min (ref >60)
Glucose, Bld: 129 mg/dL — ABNORMAL HIGH (ref 70–99)
Potassium: 4.3 mmol/L (ref 3.5–5.1)
Sodium: 136 mmol/L (ref 135–145)

## 2023-09-17 LAB — MAGNESIUM: Magnesium: 1.8 mg/dL (ref 1.7–2.4)

## 2023-09-17 LAB — STREP PNEUMONIAE URINARY ANTIGEN: Strep Pneumo Urinary Antigen: NEGATIVE

## 2023-09-17 MED ORDER — CEPHALEXIN 500 MG PO CAPS: 500.0000 mg | ORAL_CAPSULE | Freq: Two times a day (BID) | ORAL | 0 refills | Status: AC

## 2023-09-17 MED ORDER — ASPIRIN 81 MG PO TBEC
81.0000 mg | DELAYED_RELEASE_TABLET | Freq: Every day | ORAL | Status: DC
  Administered 2023-09-17: 81 mg via ORAL
  Filled 2023-09-17: qty 1

## 2023-09-17 MED ORDER — SPIRIVA RESPIMAT 2.5 MCG/ACT IN AERS: 2.5000 ug | INHALATION_SPRAY | Freq: Every day | RESPIRATORY_TRACT | 0 refills | Status: DC

## 2023-09-17 MED ORDER — ASPIRIN 81 MG PO TBEC: 81.0000 mg | DELAYED_RELEASE_TABLET | Freq: Every day | ORAL | 0 refills | Status: AC

## 2023-09-17 MED ORDER — TRELEGY ELLIPTA 100-62.5-25 MCG/ACT IN AEPB: 1.0000 | INHALATION_SPRAY | Freq: Every day | RESPIRATORY_TRACT | 0 refills | Status: DC

## 2023-09-17 MED ORDER — AZITHROMYCIN 500 MG PO TABS: 500.0000 mg | ORAL_TABLET | Freq: Every day | ORAL | 0 refills | Status: AC

## 2023-09-17 NOTE — Care Management Obs Status (Signed)
MEDICARE OBSERVATION STATUS NOTIFICATION   Patient Details  Name: Richard [name] MRN: [id number] Date of Birth: 1971-09-20   Medicare Observation Status Notification Given:  Yes    Gala Lewandowsky, RN 09/17/2023, 2:03 PM

## 2023-09-17 NOTE — Care Management CC44 (Signed)
Condition Code 44 Documentation Completed  Patient Details  Name: Richard [name] MRN: [id number] Date of Birth: Oct 01, 1971   Condition Code 44 given:  Yes Patient signature on Condition Code 44 notice:  Yes Documentation of 2 MD's agreement:  Yes Code 44 added to claim:  Yes    Gala Lewandowsky, RN 09/17/2023, 2:03 PM

## 2023-09-17 NOTE — ED Notes (Signed)
ED TO INPATIENT HANDOFF REPORT  ED Nurse Name and Phone #: Amil Amen [phone]  S Name/Age/Gender Richard [name] 52 y.o. male Room/Bed: 002C/002C  Code Status   Code Status: Full Code  Home/SNF/Other Home Patient oriented to: self, place, time, and situation Is this baseline? Yes   Triage Complete: Triage complete  Chief Complaint Acute respiratory failure with hypoxia (HCC) [J96.01]  Triage Note Pt arrived via ems to the er for the c/o sob/ pt was away from home and ran out of portable 02. Initial 02 68%. 100% on 6/L [place]. 186/120. Pulse 120. 40r.  Pt complains of a headache. Hx current lung cancer. Sob on arrival.    Allergies Allergies  Allergen Reactions   Dilaudid [Hydromorphone Hcl] Nausea Only and Other (See Comments)    Bradycardia and hyperthermia, too   Tape Itching and Dermatitis    Level of Care/Admitting Diagnosis ED Disposition     ED Disposition  Admit   Condition  --   Comment  Hospital Area: [facility] [[id number]]  Level of Care: Telemetry Medical [[id number]]  May admit patient to [facility] or [facility] if equivalent level of care is available:: Yes  Covid Evaluation: Confirmed COVID Negative  Diagnosis: Acute respiratory failure with hypoxia [facility]) [[id number]]  Admitting Physician: Gery Pray [[id number]]  Attending Physician: Gery Pray [[id number]]  Certification:: I certify this patient will need inpatient services for at least 2 midnights  Expected Medical Readiness: 09/18/2023          B Medical/Surgery History Past Medical History:  Diagnosis Date   Asthma    Brain bleed (HCC)    DVT (deep venous thrombosis) (HCC) 02/19/2015   RLE   GERD (gastroesophageal reflux disease)    History of home oxygen therapy    Hypertension    Seizures (HCC)    last episode 03/2013   Stroke [facility])    Past Surgical History:  Procedure Laterality Date   BRONCHIAL BIOPSY  02/10/2022   Procedure: BRONCHIAL BIOPSIES;  Surgeon: Oretha Milch, MD;   Location: [facility];  Service: Cardiopulmonary;;   BRONCHIAL BRUSHINGS  02/10/2022   Procedure: BRONCHIAL BRUSHINGS;  Surgeon: Oretha Milch, MD;  Location: [facility];  Service: Cardiopulmonary;;   BRONCHIAL NEEDLE ASPIRATION BIOPSY  02/10/2022   Procedure: BRONCHIAL NEEDLE ASPIRATION BIOPSIES;  Surgeon: Oretha Milch, MD;  Location: [facility];  Service: Cardiopulmonary;;   BRONCHIAL WASHINGS  02/10/2022   Procedure: BRONCHIAL WASHINGS;  Surgeon: Oretha Milch, MD;  Location: [facility];  Service: Cardiopulmonary;;   ENDOBRONCHIAL ULTRASOUND Bilateral 02/10/2022   Procedure: ENDOBRONCHIAL ULTRASOUND;  Surgeon: Oretha Milch, MD;  Location: [facility];  Service: Cardiopulmonary;  Laterality: Bilateral;   LEG SURGERY     VIDEO BRONCHOSCOPY  02/10/2022   Procedure: VIDEO BRONCHOSCOPY WITHOUT FLUORO;  Surgeon: Oretha Milch, MD;  Location: [facility];  Service: Cardiopulmonary;;     A IV Location/Drains/Wounds Patient Lines/Drains/Airways Status     Active Line/Drains/Airways     Name Placement date Placement time Site Days   Peripheral IV 09/16/23 20 G Right Antecubital 09/16/23  1828  Antecubital  1            Intake/Output Last 24 hours No intake or output data in the 24 hours ending 09/17/23 0548  Labs/Imaging Results for orders placed or performed during the hospital encounter of 09/16/23 (from the past 48 hour(s))  SARS Coronavirus 2 by RT PCR (hospital order, performed in [facility] hospital lab) *cepheid single result test*  Anterior Nasal Swab     Status: None   Collection Time: 09/16/23  5:50 PM   Specimen: Anterior Nasal Swab  Result Value Ref Range   SARS Coronavirus 2 by RT PCR NEGATIVE NEGATIVE    Comment: Performed at [facility], [address], [place], [place] [address]  CBC     Status: Abnormal   Collection Time: 09/16/23  6:23 PM  Result Value Ref Range   WBC 6.9 4.0 - 10.5 K/uL   RBC 5.70 4.22 - 5.81 MIL/uL   Hemoglobin 17.7 (H) 13.0  - 17.0 g/dL   HCT 60.4 (H) 54.0 - 98.1 %   MCV 95.1 80.0 - 100.0 fL   MCH 31.1 26.0 - 34.0 pg   MCHC 32.7 30.0 - 36.0 g/dL   RDW 19.1 47.8 - 29.5 %   Platelets 210 150 - 400 K/uL   nRBC 0.0 0.0 - 0.2 %    Comment: Performed at [facility], [address] [place], [place] [address]  Basic metabolic panel     Status: Abnormal   Collection Time: 09/16/23  6:23 PM  Result Value Ref Range   Sodium 139 135 - 145 mmol/L   Potassium 4.3 3.5 - 5.1 mmol/L   Chloride 102 98 - 111 mmol/L   CO2 20 (L) 22 - 32 mmol/L   Glucose, Bld 84 70 - 99 mg/dL    Comment: Glucose reference range applies only to samples taken after fasting for at least 8 hours.   BUN 11 6 - 20 mg/dL   Creatinine, Ser 8.65 0.61 - 1.24 mg/dL   Calcium 9.3 8.9 - 78.4 mg/dL   GFR, Estimated >69 >62 mL/min    Comment: (NOTE) Calculated using the CKD-EPI Creatinine Equation (2021)    Anion gap 17 (H) 5 - 15    Comment: Performed at [facility], [address], [place], [place] [address]  I-Stat venous blood gas, [facility] ED, [facility], [facility])     Status: Abnormal   Collection Time: 09/16/23  6:31 PM  Result Value Ref Range   pH, Ven 7.287 7.25 - 7.43   pCO2, Ven 46.1 44 - 60 mmHg   pO2, Ven 44 32 - 45 mmHg   Bicarbonate 22.0 20.0 - 28.0 mmol/L   TCO2 23 22 - 32 mmol/L   O2 Saturation 74 %   Acid-base deficit 5.0 (H) 0.0 - 2.0 mmol/L   Sodium 140 135 - 145 mmol/L   Potassium 4.3 3.5 - 5.1 mmol/L   Calcium, Ion 1.20 1.15 - 1.40 mmol/L   HCT 58.0 (H) 39.0 - 52.0 %   Hemoglobin 19.7 (H) 13.0 - 17.0 g/dL   Sample type VENOUS   I-stat chem 8, ED (not at [facility], [facility] or [facility])     Status: Abnormal   Collection Time: 09/16/23  6:32 PM  Result Value Ref Range   Sodium 141 135 - 145 mmol/L   Potassium 4.3 3.5 - 5.1 mmol/L   Chloride 106 98 - 111 mmol/L   BUN 11 6 - 20 mg/dL   Creatinine, Ser 1.32 0.61 - 1.24 mg/dL   Glucose, Bld 83 70 - 99 mg/dL    Comment: Glucose reference range applies only to samples taken after fasting  for at least 8 hours.   Calcium, Ion 1.14 (L) 1.15 - 1.40 mmol/L   TCO2 22 22 - 32 mmol/L   Hemoglobin 19.7 (H) 13.0 - 17.0 g/dL   HCT 44.0 (H) 10.2 -  52.0 %  Strep pneumoniae urinary antigen     Status: None   Collection Time: 09/17/23 12:13 AM  Result Value Ref Range   Strep Pneumo Urinary Antigen NEGATIVE NEGATIVE    Comment:        Infection due to S. pneumoniae cannot be absolutely ruled out since the antigen present may be below the detection limit of the test. Performed at [facility], [address], [place], [place] [address]   Basic metabolic panel     Status: Abnormal   Collection Time: 09/17/23  2:35 AM  Result Value Ref Range   Sodium 136 135 - 145 mmol/L   Potassium 4.3 3.5 - 5.1 mmol/L   Chloride 102 98 - 111 mmol/L   CO2 24 22 - 32 mmol/L   Glucose, Bld 129 (H) 70 - 99 mg/dL    Comment: Glucose reference range applies only to samples taken after fasting for at least 8 hours.   BUN 11 6 - 20 mg/dL   Creatinine, Ser 6.04 0.61 - 1.24 mg/dL   Calcium 8.7 (L) 8.9 - 10.3 mg/dL   GFR, Estimated >54 >09 mL/min    Comment: (NOTE) Calculated using the CKD-EPI Creatinine Equation (2021)    Anion gap 10 5 - 15    Comment: Performed at [facility], [address], [place], [place] [address]  Magnesium     Status: None   Collection Time: 09/17/23  2:35 AM  Result Value Ref Range   Magnesium 1.8 1.7 - 2.4 mg/dL    Comment: Performed at [facility], [address], [place], [place] [address]  CBC with Differential/Platelet     Status: Abnormal   Collection Time: 09/17/23  2:35 AM  Result Value Ref Range   WBC 6.9 4.0 - 10.5 K/uL   RBC 5.58 4.22 - 5.81 MIL/uL   Hemoglobin 17.6 (H) 13.0 - 17.0 g/dL   HCT 56.2 (H) 13.0 - 86.5 %   MCV 96.8 80.0 - 100.0 fL   MCH 31.5 26.0 - 34.0 pg   MCHC 32.6 30.0 - 36.0 g/dL   RDW 78.4 69.6 - 29.5 %   Platelets 206 150 - 400 K/uL   nRBC 0.0 0.0 - 0.2 %   Neutrophils Relative % 92 %   Neutro Abs 6.4 1.7 - 7.7  K/uL   Lymphocytes Relative 4 %   Lymphs Abs 0.3 (L) 0.7 - 4.0 K/uL   Monocytes Relative 4 %   Monocytes Absolute 0.3 0.1 - 1.0 K/uL   Eosinophils Relative 0 %   Eosinophils Absolute 0.0 0.0 - 0.5 K/uL   Basophils Relative 0 %   Basophils Absolute 0.0 0.0 - 0.1 K/uL   Immature Granulocytes 0 %   Abs Immature Granulocytes 0.02 0.00 - 0.07 K/uL    Comment: Performed at [facility], [address] [place], [place] [address]   CT Chest W Contrast  Result Date: 09/16/2023 CLINICAL DATA:  History of right upper lobe non-small cell lung cancer. Shortness of breath. * Tracking Code: BO * EXAM: CT CHEST WITH CONTRAST TECHNIQUE: Multidetector CT imaging of the chest was performed during intravenous contrast administration. RADIATION DOSE REDUCTION: This exam was performed according to the departmental dose-optimization program which includes automated exposure control, adjustment of the mA and/or kV according to patient size and/or use of iterative reconstruction technique. CONTRAST:  50mL OMNIPAQUE IOHEXOL 350 MG/ML SOLN COMPARISON:  CT chest dated 04/19/2023 multiple priors. FINDINGS: Cardiovascular: Normal heart  size. No significant pericardial fluid/thickening. Ascending thoracic aorta measures 4.5 x 4.4 cm, previously 4.0 cm. No central pulmonary emboli. Mediastinum/Nodes: Imaged thyroid gland without nodules meeting criteria for imaging follow-up by size. Normal esophagus. Unchanged 10 mm subcarinal lymph node (2:57). Multiple additional prominent mediastinal lymph nodes are also unchanged. Lungs/Pleura: The central airways are patent. Right middle and bilateral lower lobe subsegmental mucous plugging. Upper lobe predominant centrilobular emphysema. Continued decrease in size of posterior right upper lobe mass measuring 4.6 x 2.9 cm (2:52), previously 5.2 x 3.0 cm. Unchanged 8 x 8 mm irregular right upper lobe nodule (3:43). Similar encasement of the right hilum. New tree-in-bud nodules along the  medial left upper lobe. No pneumothorax. No pleural effusion. Upper abdomen: Unchanged thickening of the left adrenal gland. Musculoskeletal: No acute or abnormal lytic or blastic osseous lesions. Multilevel degenerative changes of the thoracic spine. IMPRESSION: 1. Continued decrease in size of posterior right upper lobe mass measuring 4.6 x 2.9 cm, previously 5.2 x 3.0 cm. Unchanged 8 x 8 mm irregular right upper lobe nodule. 2. New tree-in-bud nodules along the medial left upper lobe, likely infectious/inflammatory. 3. Unchanged prominent mediastinal lymph nodes. 4. Ascending thoracic aortic aneurysm measures 4.5 cm. Ascending thoracic aortic aneurysm. Recommend semi-annual imaging followup by CTA or MRA and referral to cardiothoracic surgery if not already obtained. This recommendation follows 2010 ACCF/AHA/AATS/ACR/ASA/SCA/SCAI/SIR/STS/SVM Guidelines for the Diagnosis and Management of Patients With Thoracic Aortic Disease. Circulation. 2010; 121: X324-M010. Aortic aneurysm NOS (ICD10-I71.9) 5.  Emphysema (ICD10-J43.9). Electronically Signed   By: Agustin Cree M.D.   On: 09/16/2023 20:18   DG Chest Port 1 View  Result Date: 09/16/2023 CLINICAL DATA:  Dyspnea. EXAM: PORTABLE CHEST 1 VIEW COMPARISON:  June 26, 2023 FINDINGS: The cardiac silhouette is mildly enlarged and unchanged in size. The lungs are mildly hyperinflated. A stable appearing right suprahilar opacity is seen consistent with the patient's known retro hilar mass. Mild, stable right upper lobe linear scarring and/or atelectasis is noted. No pleural effusion or pneumothorax is identified. The visualized skeletal structures are unremarkable. IMPRESSION: 1. Findings consistent with the patient's known right-sided retro hilar mass. 2. Mild, stable right upper lobe linear scarring and/or atelectasis. Electronically Signed   By: Aram Candela M.D.   On: 09/16/2023 19:27    Pending Labs Unresulted Labs (From admission, onward)     Start      Ordered   09/16/23 2338  Legionella Pneumophila Serogp 1 Ur Ag  Once,   R        09/16/23 2338   09/16/23 2338  Expectorated Sputum Assessment w Gram Stain, Rflx to Resp Cult  Once,   R        09/16/23 2338   09/16/23 2215  Culture, blood (Routine X 2) w Reflex to ID Panel  BLOOD CULTURE X 2,   R (with STAT occurrences)      09/16/23 2214   09/16/23 1750  Blood gas, venous (at [facility] and [facility])  Once,   R        09/16/23 1749            Vitals/Pain Today's Vitals   09/17/23 0200 09/17/23 0421 09/17/23 0500 09/17/23 0516  BP: (!) 117/92  102/81   Pulse: (!) 105  (!) 33   Resp: (!) 25  (!) 23   Temp:  98.4 F (36.9 C)  98.3 F (36.8 C)  TempSrc:  Oral  Oral  SpO2: 92%  97%   Weight:  Height:      PainSc:        Isolation Precautions No active isolations  Medications Medications  albuterol (PROVENTIL) (2.5 MG/3ML) 0.083% nebulizer solution 2.5 mg (2.5 mg Nebulization Given 09/17/23 0129)  morphine (PF) 2 MG/ML injection 1 mg (1 mg Intravenous Given 09/16/23 2220)  nicotine (NICODERM CQ - dosed in mg/24 hours) patch 14 mg (14 mg Transdermal Patch Applied 09/16/23 2323)  dextromethorphan-guaiFENesin (MUCINEX DM) 30-600 MG per 12 hr tablet 1 tablet (1 tablet Oral Given 09/17/23 0008)  cefTRIAXone (ROCEPHIN) 2 g in sodium chloride 0.9 % 100 mL IVPB (has no administration in time range)  azithromycin (ZITHROMAX) 500 mg in sodium chloride 0.9 % 250 mL IVPB (has no administration in time range)  methylPREDNISolone sodium succinate (SOLU-MEDROL) 125 mg/2 mL injection 80 mg (has no administration in time range)  benzonatate (TESSALON) capsule 100 mg (has no administration in time range)  heparin injection 5,000 Units (has no administration in time range)  acetaminophen (TYLENOL) tablet 650 mg (650 mg Oral Given 09/17/23 0457)    Or  acetaminophen (TYLENOL) suppository 650 mg ( Rectal See Alternative 09/17/23 0457)  senna-docusate (Senokot-S) tablet 1 tablet (has no administration in time  range)  ondansetron (ZOFRAN) tablet 4 mg (has no administration in time range)    Or  ondansetron (ZOFRAN) injection 4 mg (has no administration in time range)  levETIRAcetam (KEPPRA) tablet 500 mg (500 mg Oral Given 09/17/23 0008)  losartan (COZAAR) tablet 100 mg (100 mg Oral Given 09/17/23 0008)  amLODipine (NORVASC) tablet 10 mg (has no administration in time range)  sertraline (ZOLOFT) tablet 25 mg (has no administration in time range)  fluticasone furoate-vilanterol (BREO ELLIPTA) 100-25 MCG/ACT 1 puff (has no administration in time range)    And  umeclidinium bromide (INCRUSE ELLIPTA) 62.5 MCG/ACT 1 puff (has no administration in time range)  aspirin chewable tablet 81 mg (has no administration in time range)  ipratropium-albuterol (DUONEB) 0.5-2.5 (3) MG/3ML nebulizer solution 3 mL ( Nebulization Not Given 09/17/23 0117)  iohexol (OMNIPAQUE) 350 MG/ML injection 50 mL (50 mLs Intravenous Contrast Given 09/16/23 1904)  cefTRIAXone (ROCEPHIN) 1 g in sodium chloride 0.9 % 100 mL IVPB (0 g Intravenous Stopped 09/16/23 2137)  doxycycline (VIBRA-TABS) tablet 100 mg (100 mg Oral Given 09/16/23 2052)  methylPREDNISolone sodium succinate (SOLU-MEDROL) 40 mg/mL injection 40 mg (40 mg Intravenous Given 09/17/23 0008)    Mobility walks     Focused Assessments Pulmonary Assessment Handoff:  Lung sounds: Bilateral Breath Sounds: Clear L Breath Sounds: Clear R Breath Sounds: Clear O2 Device: High Flow Nasal Cannula O2 Flow Rate (L/min): 8 L/min    R Recommendations: See Admitting Provider Note  Report given to:   Additional Notes: pt very irritable and anxious. Periodically wants to leave AMA. Currently on 6L [place] and sats 88-92% per MD keep sats >88%

## 2023-09-17 NOTE — Progress Notes (Signed)
SATURATION QUALIFICATIONS: (This note is used to comply with regulatory documentation for home oxygen)  Patient Saturations on Room Air at Rest = 84%  Patient Saturations on Room Air while Ambulating = 81 %  Patient Saturations on 6  Liters of oxygen while Ambulating = 92%  Please briefly explain why patient needs home oxygen:

## 2023-09-17 NOTE — Discharge Instructions (Signed)
Continue to wear your oxygen at all times.  Please follow up with your oncologist within a week

## 2023-09-17 NOTE — Discharge Summary (Signed)
Physician Discharge Summary  Patient: Richard [name] WUJ:[id number] DOB: May 01, 1971   Code Status: Full Code Admit date: 09/16/2023 Discharge date: 09/17/2023 Disposition: Home, No home health services recommended PCP: Pcp, No  Recommendations for Outpatient Follow-up:  Follow up with PCP within 1-2 weeks Regarding general hospital follow up and preventative care Recommend monitoring oxygen level needed once recovered from pneumonia Follow up with oncology  Discharge Diagnoses:  Active Problems:   TOBACCO ABUSE   Essential hypertension   Malignant neoplasm of overlapping sites of right lung (HCC)   Obstructive pneumonia   Chronic respiratory failure with hypoxia (HCC)   History of seizures   CAP (community acquired pneumonia)   H/O: CVA (cerebrovascular accident)   H/O deep vein thrombophlebitis of lower extremity   Acute respiratory failure with hypoxia (HCC)   Malignant neoplasm of overlapping sites of lung (HCC)   Pneumonia of left lung due to infectious organism  Brief Hospital Course Summary: This is a 52 year old male with history of chronic respiratory failure, stage IIIb NSCLC, COPD, ongoing tobacco use, h/o seizures, DVT, and intracranial bleed.  Patient s/p palliative radiotherapy 02/2022.  Chemotherapy with carboplatin and paclitaxel initiated.  Patient noncompliant with chemotherapy.  Patient currently on immunotherapy.  Patient is a scant historian.   He presents to the ER with complaint of issues with breathing.  He states he cannot move too far without losing his breath.  He endorses wheezing and a mild fleeting right-sided chest pain. Although he has home oxygen that is supposed to be worn constantly, he has not been compliant with it which he states is due to the cord being too short and having a broken converter.   In the ER patient's presenting vitals 156/103, 110, 30, afebrile.  Respiratory panel negative.  CT chest shows changes consistent with medial left upper  lobe infection.  A 4.5 cm ascending thoracic aneurysm also noted.  Patient is known lung cancer documented.  Patient on 6 L oxygen satting 92% in ER. Prior oxygen baseline was 4L.  On day after admission, patient states that he has no symptoms when he is on oxygen. He agrees to wear oxygen consistently if he is given a longer cord and oxygen concentrator. He is addiment about going home today.  As he has the right to make the decision to go home, he was assisted with the needed equipment to use for oxygenation as well as continued oral antibiotic course to treat his pneumonia. Highly recommended close follow up with his oncologist and pulmonologist.   Discharge Condition: Stable, improved Recommended discharge diet: Cardiac diet  Consultations: None   Procedures/Studies: None   Allergies as of 09/17/2023       Reactions   Dilaudid [hydromorphone Hcl] Nausea Only, Other (See Comments)   Bradycardia and hyperthermia, too   Tape Itching, Dermatitis        Medication List     STOP taking these medications    Aspirin Low Dose 81 MG chewable tablet Generic drug: aspirin Replaced by: aspirin EC 81 MG tablet   [name] 160-4.5 MCG/ACT inhaler Generic drug: budesonide-formoterol   OXYGEN       TAKE these medications    albuterol 108 (90 Base) MCG/ACT inhaler Commonly known as: VENTOLIN HFA Inhale 1-2 puffs into the lungs every 6 (six) hours as needed for wheezing or shortness of breath.   amLODipine 10 MG tablet Commonly known as: NORVASC Take 10 mg by mouth every morning.   aspirin EC 81 MG tablet Take  1 tablet (81 mg total) by mouth daily. Swallow whole. Start taking on: September 18, 2023 Replaces: Aspirin Low Dose 81 MG chewable tablet   azithromycin 500 MG tablet Commonly known as: Zithromax Take 1 tablet (500 mg total) by mouth daily for 3 days.   cephALEXin 500 MG capsule Commonly known as: KEFLEX Take 1 capsule (500 mg total) by mouth 2 (two) times daily for 5  days.   cloNIDine 0.1 MG tablet Commonly known as: CATAPRES Take 0.1 mg by mouth. Take 2 tablets by mouth at 9am and 5pm and 1 tablet at bedtime   hydrochlorothiazide 12.5 MG tablet Commonly known as: HYDRODIURIL Take 12.5 mg by mouth every morning.   levETIRAcetam 500 MG tablet Commonly known as: KEPPRA Take 1 tablet (500 mg total) by mouth 2 (two) times daily.   losartan 100 MG tablet Commonly known as: COZAAR Take 100 mg by mouth daily.   sertraline 25 MG tablet Commonly known as: ZOLOFT Take 25 mg by mouth daily.   Spiriva Respimat 2.5 MCG/ACT Aers Generic drug: Tiotropium Bromide Monohydrate Inhale 2.5 mcg into the lungs daily. What changed:  how much to take when to take this   Trelegy Ellipta 100-62.5-25 MCG/ACT Aepb Generic drug: Fluticasone-Umeclidin-Vilant Take 1 puff by mouth daily.               Durable Medical Equipment  (From admission, onward)           Start     Ordered   09/17/23 1055  For home use only DME oxygen  Once       Question Answer Comment  Length of Need 6 Months   Mode or (Route) Nasal cannula   Liters per Minute 6   Frequency Continuous (stationary and portable oxygen unit needed)   Oxygen conserving device Yes   Oxygen delivery system Gas      09/17/23 1055            Follow-up Information     [facility] Follow up.   Why: Oxygen to be delivered to the room- concentrator to be delivered to the home. Contact information: [address] [place] [address] [phone]                 Subjective   Pt reports feeling at his baseline while he's using the oxygen. Currently on 6L. Denies any shortness of breath when ambulating in his room. He agrees to allow up to monitor his oxygen saturation while ambulating to assist with his home oxygen needs but says that he will go home after that regardless if we agree to discharge or not.   All questions and concerns were addressed at time of  discharge.  Objective  Blood pressure (!) 129/100, pulse 97, temperature 98.1 F (36.7 C), temperature source Oral, resp. rate 18, height 5\' 9"  (1.753 m), weight 88.5 kg, SpO2 90%.   General: Pt is alert, awake, not in acute distress Cardiovascular: RRR, S1/S2 +, no rubs, no gallops Respiratory: CTA bilaterally, no wheezing, no rhonchi Abdominal: Soft, NT, ND, bowel sounds + Extremities: no edema, no cyanosis  The results of significant diagnostics from this hospitalization (including imaging, microbiology, ancillary and laboratory) are listed below for reference.   Imaging studies: CT Chest W Contrast  Result Date: 09/16/2023 CLINICAL DATA:  History of right upper lobe non-small cell lung cancer. Shortness of breath. * Tracking Code: BO * EXAM: CT CHEST WITH CONTRAST TECHNIQUE: Multidetector CT imaging of the chest was performed  during intravenous contrast administration. RADIATION DOSE REDUCTION: This exam was performed according to the departmental dose-optimization program which includes automated exposure control, adjustment of the mA and/or kV according to patient size and/or use of iterative reconstruction technique. CONTRAST:  50mL OMNIPAQUE IOHEXOL 350 MG/ML SOLN COMPARISON:  CT chest dated 04/19/2023 multiple priors. FINDINGS: Cardiovascular: Normal heart size. No significant pericardial fluid/thickening. Ascending thoracic aorta measures 4.5 x 4.4 cm, previously 4.0 cm. No central pulmonary emboli. Mediastinum/Nodes: Imaged thyroid gland without nodules meeting criteria for imaging follow-up by size. Normal esophagus. Unchanged 10 mm subcarinal lymph node (2:57). Multiple additional prominent mediastinal lymph nodes are also unchanged. Lungs/Pleura: The central airways are patent. Right middle and bilateral lower lobe subsegmental mucous plugging. Upper lobe predominant centrilobular emphysema. Continued decrease in size of posterior right upper lobe mass measuring 4.6 x 2.9 cm (2:52),  previously 5.2 x 3.0 cm. Unchanged 8 x 8 mm irregular right upper lobe nodule (3:43). Similar encasement of the right hilum. New tree-in-bud nodules along the medial left upper lobe. No pneumothorax. No pleural effusion. Upper abdomen: Unchanged thickening of the left adrenal gland. Musculoskeletal: No acute or abnormal lytic or blastic osseous lesions. Multilevel degenerative changes of the thoracic spine. IMPRESSION: 1. Continued decrease in size of posterior right upper lobe mass measuring 4.6 x 2.9 cm, previously 5.2 x 3.0 cm. Unchanged 8 x 8 mm irregular right upper lobe nodule. 2. New tree-in-bud nodules along the medial left upper lobe, likely infectious/inflammatory. 3. Unchanged prominent mediastinal lymph nodes. 4. Ascending thoracic aortic aneurysm measures 4.5 cm. Ascending thoracic aortic aneurysm. Recommend semi-annual imaging followup by CTA or MRA and referral to cardiothoracic surgery if not already obtained. This recommendation follows 2010 ACCF/AHA/AATS/ACR/ASA/SCA/SCAI/SIR/STS/SVM Guidelines for the Diagnosis and Management of Patients With Thoracic Aortic Disease. Circulation. 2010; 121: A213-Y865. Aortic aneurysm NOS (ICD10-I71.9) 5.  Emphysema (ICD10-J43.9). Electronically Signed   By: Agustin Cree M.D.   On: 09/16/2023 20:18   DG Chest Port 1 View  Result Date: 09/16/2023 CLINICAL DATA:  Dyspnea. EXAM: PORTABLE CHEST 1 VIEW COMPARISON:  June 26, 2023 FINDINGS: The cardiac silhouette is mildly enlarged and unchanged in size. The lungs are mildly hyperinflated. A stable appearing right suprahilar opacity is seen consistent with the patient's known retro hilar mass. Mild, stable right upper lobe linear scarring and/or atelectasis is noted. No pleural effusion or pneumothorax is identified. The visualized skeletal structures are unremarkable. IMPRESSION: 1. Findings consistent with the patient's known right-sided retro hilar mass. 2. Mild, stable right upper lobe linear scarring and/or  atelectasis. Electronically Signed   By: Aram Candela M.D.   On: 09/16/2023 19:27    Labs: Basic Metabolic Panel: Recent Labs  Lab 09/16/23 1823 09/16/23 1831 09/16/23 1832 09/17/23 0235  NA 139 140 141 136  K 4.3 4.3 4.3 4.3  CL 102  --  106 102  CO2 20*  --   --  24  GLUCOSE 84  --  83 129*  BUN 11  --  11 11  CREATININE 1.01  --  1.00 0.96  CALCIUM 9.3  --   --  8.7*  MG  --   --   --  1.8   CBC: Recent Labs  Lab 09/16/23 1823 09/16/23 1831 09/16/23 1832 09/17/23 0235  WBC 6.9  --   --  6.9  NEUTROABS  --   --   --  6.4  HGB 17.7* 19.7* 19.7* 17.6*  HCT 54.2* 58.0* 58.0* 54.0*  MCV 95.1  --   --  96.8  PLT 210  --   --  206   Microbiology: Results for orders placed or performed during the hospital encounter of 09/16/23  SARS Coronavirus 2 by RT PCR (hospital order, performed in [facility] hospital lab) *cepheid single result test* Anterior Nasal Swab     Status: None   Collection Time: 09/16/23  5:50 PM   Specimen: Anterior Nasal Swab  Result Value Ref Range Status   SARS Coronavirus 2 by RT PCR NEGATIVE NEGATIVE Final    Comment: Performed at [facility], [address], [place], [place] [address]  Culture, blood (Routine X 2) w Reflex to ID Panel     Status: None (Preliminary result)   Collection Time: 09/16/23 10:57 PM   Specimen: BLOOD  Result Value Ref Range Status   Specimen Description BLOOD LEFT ANTECUBITAL  Final   Special Requests   Final    BOTTLES DRAWN AEROBIC ONLY Blood Culture adequate volume   Culture   Final    NO GROWTH 4 DAYS Performed at [facility], [address] [place], [place] [address]    Report Status PENDING  Incomplete  Culture, blood (Routine X 2) w Reflex to ID Panel     Status: None (Preliminary result)   Collection Time: 09/16/23 10:57 PM   Specimen: BLOOD  Result Value Ref Range Status   Specimen Description BLOOD LEFT ANTECUBITAL  Final   Special Requests   Final    BOTTLES DRAWN AEROBIC ONLY Blood  Culture adequate volume   Culture   Final    NO GROWTH 4 DAYS Performed at [facility], [address], [place], [place] [address]    Report Status PENDING  Incomplete   Time coordinating discharge: Over 30 minutes  Leeroy Bock, MD  [facility] 09/17/2023, 1:01 PM

## 2023-09-17 NOTE — TOC Initial Note (Signed)
Transition of Care [facility]) - Initial/Assessment Note    Patient Details  Name: Richard [name] MRN: [id number] Date of Birth: 1971-10-29  [facility]) CM/SW Contact:    Gala Lewandowsky, RN Phone Number: 09/17/2023, 12:40 PM  Clinical Narrative: Patient presented for shortness of breath. Patient has insurance and PCP Dr. Jerolyn Center. Patient reports that he has oxygen in the home; however, patient was unable to remember the name of the agency. Case Manager called [employer] and the patient is active for oxygen; he was previously on a poc @ 4 liters and the new order is for 6 liters. The narrative note has been entered into EPIC and the attending will sign. [employer] will bring probably 2-3 portable tanks to travel home. A new 10 Liter concentrator will have to be delivered today to the home. Case Manager educated the patient regarding oxygen compliance in the home. Patient reports that his uncle will be picking him up for transport home. No further needs identified.                 Expected Discharge Plan: Home/Self Care Barriers to Discharge: No Barriers Identified   Patient Goals and CMS Choice Patient states their goals for this hospitalization and ongoing recovery are:: to return home with spouse.   Choice offered to / list presented to : NA     Expected Discharge Plan and Services     Post Acute Care Choice: NA Living arrangements for the past 2 months: Single Family Home                 DME Arranged: Oxygen DME Agency: [employer] Date DME Agency Contacted: 09/17/23 Time DME Agency Contacted: 1200 Representative spoke with at DME Agency: Zack    Prior Living Arrangements/Services Living arrangements for the past 2 months: Single Family Home Lives with:: Spouse Patient language and need for interpreter reviewed:: Yes Do you feel safe going back to the place where you live?: Yes      Need for Family Participation in Patient Care: Yes (Comment) Care giver support  system in place?: Yes (comment) Current home services: DME (oxygen via [employer]) Criminal Activity/Legal Involvement Pertinent to Current Situation/Hospitalization: No - Comment as needed  Activities of Daily Living   ADL Screening (condition at time of admission) Independently performs ADLs?: Yes (appropriate for developmental age) Is the patient deaf or have difficulty hearing?: No Does the patient have difficulty seeing, even when wearing glasses/contacts?: No Does the patient have difficulty concentrating, remembering, or making decisions?: No  Permission Sought/Granted Permission sought to share information with : Family Supports, Case Production designer, theatre/television/film, Oceanographer granted to share information with : Yes, Verbal Permission Granted     Permission granted to share info w AGENCY: [employer]        Emotional Assessment Appearance:: Appears stated age Attitude/Demeanor/Rapport: Unable to Assess Affect (typically observed): Unable to Assess Orientation: : Oriented to Self, Oriented to  Time, Oriented to Place, Oriented to Situation Alcohol / Substance Use: Not Applicable Psych Involvement: No (comment)  Admission diagnosis:  Hypoxia [R09.02] Acute respiratory failure with hypoxia (HCC) [J96.01] Pneumonia of left lung due to infectious organism, unspecified part of lung [J18.9] Patient Active Problem List   Diagnosis Date Noted   CAP (community acquired pneumonia) 09/16/2023   H/O: CVA (cerebrovascular accident) 09/16/2023   H/O deep vein thrombophlebitis of lower extremity 09/16/2023   Acute respiratory failure with hypoxia (HCC) 09/16/2023   Left leg swelling 01/15/2023   Knee pain, left  01/11/2023   Acute hypoxic respiratory failure (HCC) 01/10/2023   Acute bronchiolitis due to respiratory syncytial virus (RSV) 01/10/2023   History of seizures 01/10/2023   Hypertension 12/25/2022   Malignant neoplasm of right lung (HCC) 12/25/2022   Rash 06/04/2022    Chronic respiratory failure with hypoxia (HCC) 06/04/2022   Mediastinal lymphadenopathy    Obstructive pneumonia    Counseling regarding advance care planning and goals of care    Malignant neoplasm of overlapping sites of right lung (HCC) 02/03/2022   COPD exacerbation (HCC) 01/28/2022   Right sided weakness 10/13/2021   Mass of right lung 04/09/2020   Alcohol abuse with intoxication (HCC) 03/04/2020   Hypoxia 03/04/2020   Swollen testicle    Atelectasis    Syncope 08/26/2015   Acute encephalopathy 08/26/2015   Seizure (HCC)    Acute pulmonary edema (HCC)    Lower leg DVT (deep venous thromboembolism), acute (HCC)    Protein-calorie malnutrition, severe (HCC) 02/19/2015   Bacteremia due to Staphylococcus 02/17/2015   Alcohol withdrawal delirium (HCC)    Altered mental status    Metabolic acidosis    Essential hypertension    Encephalopathy 02/11/2015   Alcohol intoxication (HCC) 02/11/2015   Lactic acidosis 02/11/2015   Bleeding from the nose 03/19/2014   TIA (transient ischemic attack) 12/24/2013   Weakness 12/23/2013   Left-sided weakness 12/23/2013   Malignant hypertension 12/23/2013   Seizure disorder (HCC) 12/23/2013   Vomiting 06/18/2013   Chest pain 06/18/2013   Hypertensive urgency 10/30/2012   Migraine variant 10/30/2012   TOBACCO ABUSE 01/11/2008   HYPERTENSION, BENIGN 11/24/2007   CHRONIC FRONTAL SINUSITIS 11/24/2007   Convulsions (HCC) 11/24/2007   Headache(784.0) 11/24/2007   PCP:  Oneita Hurt, No Pharmacy:   [facility] [phone] - [place], [place] - [address] AT [facility] [address] [place] [address] Phone: [phone] Fax: [phone]  [id number] - [address] [place] - [address] [place] [address] Phone: [phone] Fax: [phone]  [facility] [address] [place] [address] Phone: [phone] Fax:  [phone]  Social Determinants of Health (SDOH) Social History: SDOH Screenings   Food Insecurity: Not on File (09/09/2023)   Received from [employer]: Low Risk  (01/16/2023)  Transportation Needs: Unmet Transportation Needs (09/17/2023)  Utilities: Not At Risk (01/16/2023)  Financial Resource Strain: Medium Risk (09/17/2023)  Physical Activity: Not on File (08/30/2023)   [facility] from [facility]  Social Connections: Not on File (08/30/2023)   Received from [facility]  Stress: Not on File (08/30/2023)   Received from [facility]  Tobacco Use: High Risk (09/16/2023)   SDOH Interventions: Transportation Interventions:  (resources offered patinet accepted.) Financial Strain Interventions: Other (Comment) (CSW offered resources for patient. Patient accepted.)  Readmission Risk Interventions     No data to display

## 2023-09-17 NOTE — Progress Notes (Incomplete)
PROGRESS NOTE  Richard [name]    DOB: [birth year], 52 y.o.  AOZ:[id number]    Code Status: Full Code   DOA: 09/16/2023   LOS: 1   Brief hospital course  Richard [name] is a 52 y.o. male with a PMH significant for chronic respiratory failure, stage IIIb NSCLC, COPD, ongoing tobacco use, h/o seizures, DVT, and intracranial bleed.  They presented from *** to the ED on 09/16/2023 with *** x *** days. ***  In the ED, it was found that they had ***.  Significant findings included ***.  They were initially treated with ***.   Patient was admitted to medicine service for further workup and management of *** as outlined in detail below.  09/17/23 -***  Assessment & Plan  Active Problems:   TOBACCO ABUSE   Essential hypertension   Malignant neoplasm of overlapping sites of right lung (HCC)   Obstructive pneumonia   Chronic respiratory failure with hypoxia (HCC)   History of seizures   CAP (community acquired pneumonia)   H/O: CVA (cerebrovascular accident)   H/O deep vein thrombophlebitis of lower extremity   Acute respiratory failure with hypoxia (HCC)  *** -   *** -   *** -   *** -   *** -   Body mass index is 28.8 kg/m.  VTE ppx: heparin injection 5,000 Units Start: 09/17/23 1400   Diet:     Diet   Diet Heart Room service appropriate? Yes; Fluid consistency: Thin   Consultants: ***  Subjective 09/17/23    Pt reports ***   Objective   Vitals:   09/17/23 0500 09/17/23 0516 09/17/23 0600 09/17/23 0622  BP: 102/81   (!) 122/93  Pulse: (!) 33  (!) 118   Resp: (!) 23   20  Temp:  98.3 F (36.8 C)  98.3 F (36.8 C)  TempSrc:  Oral  Oral  SpO2: 97%  91%   Weight:      Height:       No intake or output data in the 24 hours ending 09/17/23 0815 Filed Weights   09/16/23 1713  Weight: 88.5 kg     Physical Exam: *** General: awake, alert, NAD HEENT: atraumatic, clear conjunctiva, anicteric sclera, MMM, hearing grossly normal Respiratory: normal  respiratory effort. Cardiovascular: quick capillary refill, normal S1/S2, RRR, no JVD, murmurs Gastrointestinal: soft, NT, ND Nervous: A&O x3. no gross focal neurologic deficits, normal speech Extremities: moves all equally, no edema, normal tone Skin: dry, intact, normal temperature, normal color. No rashes, lesions or ulcers on exposed skin Psychiatry: normal mood, congruent affect  Labs   I have personally reviewed the following labs and imaging studies CBC    Component Value Date/Time   WBC 6.9 09/17/2023 0235   RBC 5.58 09/17/2023 0235   HGB 17.6 (H) 09/17/2023 0235   HGB 18.1 (H) 08/18/2023 0941   HCT 54.0 (H) 09/17/2023 0235   PLT 206 09/17/2023 0235   PLT 212 08/18/2023 0941   MCV 96.8 09/17/2023 0235   MCH 31.5 09/17/2023 0235   MCHC 32.6 09/17/2023 0235   RDW 14.6 09/17/2023 0235   LYMPHSABS 0.3 (L) 09/17/2023 0235   MONOABS 0.3 09/17/2023 0235   EOSABS 0.0 09/17/2023 0235   BASOSABS 0.0 09/17/2023 0235      Latest Ref Rng & Units 09/17/2023    2:35 AM 09/16/2023    6:32 PM 09/16/2023    6:31 PM  BMP  Glucose 70 - 99 mg/dL 962  83    BUN  6 - 20 mg/dL 11  11    Creatinine 5.62 - 1.24 mg/dL 1.30  8.65    Sodium 784 - 145 mmol/L 136  141  140   Potassium 3.5 - 5.1 mmol/L 4.3  4.3  4.3   Chloride 98 - 111 mmol/L 102  106    CO2 22 - 32 mmol/L 24     Calcium 8.9 - 10.3 mg/dL 8.7       CT Chest W Contrast  Result Date: 09/16/2023 CLINICAL DATA:  History of right upper lobe non-small cell lung cancer. Shortness of breath. * Tracking Code: BO * EXAM: CT CHEST WITH CONTRAST TECHNIQUE: Multidetector CT imaging of the chest was performed during intravenous contrast administration. RADIATION DOSE REDUCTION: This exam was performed according to the departmental dose-optimization program which includes automated exposure control, adjustment of the mA and/or kV according to patient size and/or use of iterative reconstruction technique. CONTRAST:  50mL OMNIPAQUE IOHEXOL 350  MG/ML SOLN COMPARISON:  CT chest dated 04/19/2023 multiple priors. FINDINGS: Cardiovascular: Normal heart size. No significant pericardial fluid/thickening. Ascending thoracic aorta measures 4.5 x 4.4 cm, previously 4.0 cm. No central pulmonary emboli. Mediastinum/Nodes: Imaged thyroid gland without nodules meeting criteria for imaging follow-up by size. Normal esophagus. Unchanged 10 mm subcarinal lymph node (2:57). Multiple additional prominent mediastinal lymph nodes are also unchanged. Lungs/Pleura: The central airways are patent. Right middle and bilateral lower lobe subsegmental mucous plugging. Upper lobe predominant centrilobular emphysema. Continued decrease in size of posterior right upper lobe mass measuring 4.6 x 2.9 cm (2:52), previously 5.2 x 3.0 cm. Unchanged 8 x 8 mm irregular right upper lobe nodule (3:43). Similar encasement of the right hilum. New tree-in-bud nodules along the medial left upper lobe. No pneumothorax. No pleural effusion. Upper abdomen: Unchanged thickening of the left adrenal gland. Musculoskeletal: No acute or abnormal lytic or blastic osseous lesions. Multilevel degenerative changes of the thoracic spine. IMPRESSION: 1. Continued decrease in size of posterior right upper lobe mass measuring 4.6 x 2.9 cm, previously 5.2 x 3.0 cm. Unchanged 8 x 8 mm irregular right upper lobe nodule. 2. New tree-in-bud nodules along the medial left upper lobe, likely infectious/inflammatory. 3. Unchanged prominent mediastinal lymph nodes. 4. Ascending thoracic aortic aneurysm measures 4.5 cm. Ascending thoracic aortic aneurysm. Recommend semi-annual imaging followup by CTA or MRA and referral to cardiothoracic surgery if not already obtained. This recommendation follows 2010 ACCF/AHA/AATS/ACR/ASA/SCA/SCAI/SIR/STS/SVM Guidelines for the Diagnosis and Management of Patients With Thoracic Aortic Disease. Circulation. 2010; 121: O962-X528. Aortic aneurysm NOS (ICD10-I71.9) 5.  Emphysema (ICD10-J43.9).  Electronically Signed   By: Agustin Cree M.D.   On: 09/16/2023 20:18   DG Chest Port 1 View  Result Date: 09/16/2023 CLINICAL DATA:  Dyspnea. EXAM: PORTABLE CHEST 1 VIEW COMPARISON:  June 26, 2023 FINDINGS: The cardiac silhouette is mildly enlarged and unchanged in size. The lungs are mildly hyperinflated. A stable appearing right suprahilar opacity is seen consistent with the patient's known retro hilar mass. Mild, stable right upper lobe linear scarring and/or atelectasis is noted. No pleural effusion or pneumothorax is identified. The visualized skeletal structures are unremarkable. IMPRESSION: 1. Findings consistent with the patient's known right-sided retro hilar mass. 2. Mild, stable right upper lobe linear scarring and/or atelectasis. Electronically Signed   By: Aram Candela M.D.   On: 09/16/2023 19:27    Disposition Plan & Communication  Patient status: Inpatient  Admitted From: {From:[id number]} Planned disposition location: {PLAN; DISPOSITION:26386} Anticipated discharge date: *** pending ***  Family Communication: ***  Author: Leeroy Bock, DO [facility] 09/17/2023, 8:15 AM   Available by Epic secure chat 7AM-7PM. If 7PM-7AM, please contact night-coverage.  [facility] contact information found on [email or web address].

## 2023-09-17 NOTE — Progress Notes (Signed)
CSW received consult for patient. CSW spoke with patient. CSW offered patient resources for transportation and [employer]. Patient accepted. All questions answered. Patient reports he will transport by [name] when medically ready for dc. SDOH screening complete.

## 2023-09-17 NOTE — Care Management (Signed)
    Durable Medical Equipment  (From admission, onward)           Start     Ordered   09/17/23 1055  For home use only DME oxygen  Once       Question Answer Comment  Length of Need 6 Months   Mode or (Route) Nasal cannula   Liters per Minute 6   Frequency Continuous (stationary and portable oxygen unit needed)   Oxygen conserving device Yes   Oxygen delivery system Gas      09/17/23 1055

## 2023-09-19 LAB — LEGIONELLA PNEUMOPHILA SEROGP 1 UR AG: L. pneumophila Serogp 1 Ur Ag: NEGATIVE

## 2023-09-20 ENCOUNTER — Inpatient Hospital Stay: Payer: 59 | Admitting: Licensed Clinical Social Worker

## 2023-09-20 ENCOUNTER — Ambulatory Visit: Payer: 59 | Admitting: Podiatry

## 2023-09-20 ENCOUNTER — Telehealth: Payer: Self-pay | Admitting: Medical Oncology

## 2023-09-20 ENCOUNTER — Encounter: Payer: Self-pay | Admitting: Family Medicine

## 2023-09-20 ENCOUNTER — Inpatient Hospital Stay: Admission: RE | Admit: 2023-09-20 | Payer: 59 | Source: Ambulatory Visit

## 2023-09-20 DIAGNOSIS — C3431 Malignant neoplasm of lower lobe, right bronchus or lung: Secondary | ICD-10-CM

## 2023-09-20 NOTE — Telephone Encounter (Signed)
Pt instructed to pick up antibiotic prescribed on 10/04. "I didn't know anything about picking up medication". I told him to start antibiotic and I will send a schedule message for next available appts. I told pt it may not be before his appts on 10/30.  He needs [employer]ena[place] Niece transportation.

## 2023-09-21 ENCOUNTER — Encounter: Payer: Self-pay | Admitting: Internal Medicine

## 2023-09-21 ENCOUNTER — Encounter: Payer: Self-pay | Admitting: Family Medicine

## 2023-09-21 LAB — CULTURE, BLOOD (ROUTINE X 2)
Culture: NO GROWTH
Culture: NO GROWTH
Special Requests: ADEQUATE
Special Requests: ADEQUATE

## 2023-09-21 NOTE — Progress Notes (Signed)
[facility] CSW Progress Note  Visual merchandiser  spoke with pt over the phone.  Pt called to inform he missed his appointment because he was hospitalized due to pneumonia and wished to rebook his appointments.  Pt also unable to pick up his medication at the pharmacy and requested assistance with the prescriptions.  RN informed of the above and both were addressed w/ pt.  CSW to remain available throughout duration of treatment to provide support as appropriate.        Rachel Moulds, LCSW Clinical Social Worker [facility]    Patient is participating in a Managed Medicaid Plan:  Yes

## 2023-09-22 ENCOUNTER — Telehealth: Payer: Self-pay | Admitting: Emergency Medicine

## 2023-09-22 NOTE — Telephone Encounter (Signed)
PT would like a RX of Trelegy sent to [facility] in [place].

## 2023-09-23 ENCOUNTER — Other Ambulatory Visit: Payer: Self-pay

## 2023-09-23 NOTE — Telephone Encounter (Signed)
Please work him in for a sooner acute OV w RB, APP or other provider

## 2023-09-23 NOTE — Telephone Encounter (Signed)
Patient last ov in Jan 2024 He has been seeing Dr. Shirline Frees He was recently dx with pneumonia and hospitalized. He has been using Trelegy 100, Spiriva Respimat and Albuterol. He is also on 02. Patient says he is having to use Trelegy bid. I informed that is not how he was directed to use t. He is still very sob and his f/u is not until 12/9. Please advise.

## 2023-09-24 ENCOUNTER — Ambulatory Visit (INDEPENDENT_AMBULATORY_CARE_PROVIDER_SITE_OTHER): Payer: 59 | Admitting: Pulmonary Disease

## 2023-09-24 ENCOUNTER — Encounter: Payer: Self-pay | Admitting: Pulmonary Disease

## 2023-09-24 ENCOUNTER — Ambulatory Visit: Admission: RE | Admit: 2023-09-24 | Payer: 59 | Source: Ambulatory Visit

## 2023-09-24 VITALS — BP 160/94 | HR 102 | Ht 69.0 in | Wt 192.8 lb

## 2023-09-24 DIAGNOSIS — J9611 Chronic respiratory failure with hypoxia: Secondary | ICD-10-CM

## 2023-09-24 DIAGNOSIS — J189 Pneumonia, unspecified organism: Secondary | ICD-10-CM

## 2023-09-24 MED ORDER — TRELEGY ELLIPTA 200-62.5-25 MCG/ACT IN AEPB: 1.0000 | INHALATION_SPRAY | Freq: Every day | RESPIRATORY_TRACT | 11 refills | Status: DC

## 2023-09-24 MED ORDER — ALBUTEROL SULFATE HFA 108 (90 BASE) MCG/ACT IN AERS: 2.0000 | INHALATION_SPRAY | Freq: Four times a day (QID) | RESPIRATORY_TRACT | 11 refills | Status: DC | PRN

## 2023-09-24 MED ORDER — SPIRIVA RESPIMAT 2.5 MCG/ACT IN AERS: 2.5000 ug | INHALATION_SPRAY | Freq: Every day | RESPIRATORY_TRACT | 11 refills | Status: DC

## 2023-09-24 NOTE — Telephone Encounter (Signed)
I was able to get him in today w/Dr. Judeth Horn. NFN

## 2023-09-24 NOTE — Patient Instructions (Addendum)
I sent a prescription for the higher dose of Trelegy 1 puff once a day  Provide samples of this  Use albuterol inhaler 2 puffs as needed throughout the day if you have breakthrough symptoms or feel more short of breath, use this instead of extra dose of Trelegy  Continue Spiriva 2 puffs once a day.  I also advised him samples of this and refilled this to your pharmacy.  Return to clinic in 3 months or sooner as needed with Dr. Delton Coombes follow-up

## 2023-09-25 DIAGNOSIS — R531 Weakness: Secondary | ICD-10-CM | POA: Diagnosis not present

## 2023-09-28 ENCOUNTER — Inpatient Hospital Stay: Payer: 59 | Admitting: Licensed Clinical Social Worker

## 2023-09-28 DIAGNOSIS — C3431 Malignant neoplasm of lower lobe, right bronchus or lung: Secondary | ICD-10-CM

## 2023-09-28 NOTE — Progress Notes (Signed)
[facility] CSW Progress Note  Clinical Child psychotherapist  received a call from patient regarding upcoming appointments.  CSW confirmed pt's next appointment is 10/30 at 9:15.  Pt requested a message be sent to transportation to book a ride.  Message sent and confirmation received that pt's transportation will be booked.  CSW to continue to follow as appropriate throughout duration of treatment.        Rachel Moulds, LCSW Clinical Social Worker [facility]    Patient is participating in a Managed Medicaid Plan:  Yes

## 2023-10-08 DIAGNOSIS — C3431 Malignant neoplasm of lower lobe, right bronchus or lung: Secondary | ICD-10-CM | POA: Diagnosis not present

## 2023-10-12 ENCOUNTER — Inpatient Hospital Stay: Payer: 59 | Admitting: Licensed Clinical Social Worker

## 2023-10-12 DIAGNOSIS — C3431 Malignant neoplasm of lower lobe, right bronchus or lung: Secondary | ICD-10-CM

## 2023-10-12 NOTE — Progress Notes (Signed)
[facility] CSW Progress Note  Visual merchandiser  received a call from pt inquiring if he has appointments tomorrow for infusion.  CSW confirmed pt does have appointments.  Pt states he has another doctor's appointment at the same time tomorrow because his feet are swollen.  CSW encouraged pt to keep his appointment w/ oncology tomorrow and they will look at his feet and refer to symptom management if needed.  Message sent to transportation to book his ride.  CSW to remain available as appropriate.        Rachel Moulds, LCSW Clinical Social Worker [facility]    Patient is participating in a Managed Medicaid Plan:  Yes

## 2023-10-13 ENCOUNTER — Inpatient Hospital Stay: Payer: 59 | Admitting: Dietician

## 2023-10-13 ENCOUNTER — Inpatient Hospital Stay: Payer: 59 | Admitting: Internal Medicine

## 2023-10-13 ENCOUNTER — Inpatient Hospital Stay: Payer: 59

## 2023-10-16 ENCOUNTER — Other Ambulatory Visit: Payer: Self-pay

## 2023-10-16 ENCOUNTER — Encounter (HOSPITAL_COMMUNITY): Payer: Self-pay

## 2023-10-16 ENCOUNTER — Emergency Department (HOSPITAL_COMMUNITY)
Admission: EM | Admit: 2023-10-16 | Discharge: 2023-10-16 | Discharge status: Against Medical Advice | Payer: 59 | Attending: Emergency Medicine | Admitting: Emergency Medicine

## 2023-10-16 ENCOUNTER — Emergency Department (HOSPITAL_COMMUNITY): Payer: 59

## 2023-10-16 DIAGNOSIS — Z5321 Procedure and treatment not carried out due to patient leaving prior to being seen by health care provider: Secondary | ICD-10-CM | POA: Insufficient documentation

## 2023-10-16 DIAGNOSIS — R0602 Shortness of breath: Secondary | ICD-10-CM | POA: Insufficient documentation

## 2023-10-16 NOTE — ED Triage Notes (Signed)
Pt presents via EMS co SOB while walking on the side of the road. Reports has been walking for mins. Reports wearsL 02 at home and was not on any 02 while walking on side of road. Denies chest pain.

## 2023-10-16 NOTE — ED Triage Notes (Signed)
Pt talking in full sentences, breathing equal and unlabored at this time. Pt texting on cell phone during assessment.

## 2023-10-16 NOTE — ED Notes (Signed)
On the way back from getting his cxr, Pt refusing to be seen. Refused blood work. Reports called his ride to pick him up. Pt transported to lobby by xray to leave without being seen by a provider.

## 2023-10-18 ENCOUNTER — Inpatient Hospital Stay: Payer: 59 | Attending: Internal Medicine | Admitting: Licensed Clinical Social Worker

## 2023-10-18 DIAGNOSIS — C3431 Malignant neoplasm of lower lobe, right bronchus or lung: Secondary | ICD-10-CM

## 2023-10-18 NOTE — Progress Notes (Signed)
[facility] CSW Progress Note  Visual merchandiser  received a call from pt inquiring about when his next appointment is.  Pt states he was waiting for transportation for his October appointment and it never showed.  Pt informed his next appointment is scheduled for 11/27.  Pt advised to call transportation directly to set up his transportation and if transportation does not arrive on the day of his appointment pt should contact transportation directly to inform.        Rachel Moulds, LCSW Clinical Social Worker [facility]    Patient is participating in a Managed Medicaid Plan:  Yes

## 2023-11-03 ENCOUNTER — Encounter: Payer: Self-pay | Admitting: Internal Medicine

## 2023-11-07 NOTE — Progress Notes (Deleted)
[facility] OFFICE PROGRESS NOTE  Pcp, No No address on file  DIAGNOSIS: stage IIIb (T4, N2, M0) non-small cell lung cancer, poorly differentiated carcinoma with neuroendocrine features diagnosed in January 2024 and presented with large right lower lobe lung mass in addition to right hilar and mediastinal lymphadenopathy.   PRIOR THERAPY: 1) status post short course of palliative radiotherapy with 30 Gray completed in March 2023.  2) A course of concurrent chemoradiation with weekly carboplatin for AUC of 2 and paclitaxel 45 Mg/M2.  Status post 4 cycles.  CURRENT THERAPY: Consolidation treatment with immunotherapy with Imfinzi 1500 Mg IV every 4 weeks.  First dose 04/29/2023.  Status post 3 cycles.  Patient inconsistent with compliance with his appointments and treatments.  Therefore his treatment was discontinued due to not receiving this on a regular basis ***  INTERVAL HISTORY: Richard [name] 52 y.o. male returns to the clinic today for follow-up visit.  The patient was lost to follow-up since August 2024.  The patient is inconsistent and not compliant with his appointments.  He was supposed to be undergoing monthly immunotherapy with Imfinzi which was started in May 2024 and he has only received 3 total treatments, the last being in September 2024.  Only interval since last being seen, he did present to the emergency room on 10/03/2023 for the chief complaint of shortness of breath.  Of note his most recent CT scan on 09/16/2023 showed continued decrease in size of the posterior right upper lobe mass he was treated with antibiotics for possible pneumonia.  The patient is on supplemental oxygen.  He also has inhalers.  He reach out to pulmonary medicine who recommended seeing him back sooner but it does not look like the patient had been seen possibly due to not showing up for his visit.  The patient struggles with transportation.  Today, the patient is feeling ***.  He denies any  fever, chills, or night sweats.  Hypertension?  Chest pain?  Shortness of breath?  Cough?  Denies any hemoptysis.  Denies any nausea, vomiting, diarrhea, or constipation.  Denies any headache or visual changes.  Denies any rashes or skin changes.  He is here today for evaluation and to discuss the next steps in his care     MEDICAL HISTORY: Past Medical History:  Diagnosis Date   Asthma    Brain bleed (HCC)    DVT (deep venous thrombosis) (HCC) 02/19/2015   RLE   GERD (gastroesophageal reflux disease)    History of home oxygen therapy    Hypertension    Seizures (HCC)    last episode 03/2013   [facility])     ALLERGIES:  is allergic to dilaudid [hydromorphone hcl] and tape.  MEDICATIONS:  Current Outpatient Medications  Medication Sig Dispense Refill   albuterol (VENTOLIN HFA) 108 (90 Base) MCG/ACT inhaler Inhale 2 puffs into the lungs every 6 (six) hours as needed for wheezing or shortness of breath. 1 each 11   amLODipine (NORVASC) 10 MG tablet Take 10 mg by mouth every morning.     cloNIDine (CATAPRES) 0.1 MG tablet Take 0.1 mg by mouth. Take 2 tablets by mouth at 9am and 5pm and 1 tablet at bedtime     Fluticasone-Umeclidin-Vilant (TRELEGY ELLIPTA) 200-62.5-25 MCG/ACT AEPB Inhale 1 puff into the lungs daily. 60 each 11   hydrochlorothiazide (HYDRODIURIL) 12.5 MG tablet Take 12.5 mg by mouth every morning.     levETIRAcetam (KEPPRA) 500 MG tablet Take 1 tablet (500 mg total)  by mouth 2 (two) times daily.     losartan (COZAAR) 100 MG tablet Take 100 mg by mouth daily.     sertraline (ZOLOFT) 25 MG tablet Take 25 mg by mouth daily.     SPIRIVA RESPIMAT 2.5 MCG/ACT AERS Inhale 2.5 mcg into the lungs daily. 1 each 11   No current facility-administered medications for this visit.    SURGICAL HISTORY:  Past Surgical History:  Procedure Laterality Date   BRONCHIAL BIOPSY  02/10/2022   Procedure: BRONCHIAL BIOPSIES;  Surgeon: Oretha Milch, MD;  Location: [facility];  Service:  Cardiopulmonary;;   BRONCHIAL BRUSHINGS  02/10/2022   Procedure: BRONCHIAL BRUSHINGS;  Surgeon: Oretha Milch, MD;  Location: [facility];  Service: Cardiopulmonary;;   BRONCHIAL NEEDLE ASPIRATION BIOPSY  02/10/2022   Procedure: BRONCHIAL NEEDLE ASPIRATION BIOPSIES;  Surgeon: Oretha Milch, MD;  Location: [facility];  Service: Cardiopulmonary;;   BRONCHIAL WASHINGS  02/10/2022   Procedure: BRONCHIAL WASHINGS;  Surgeon: Oretha Milch, MD;  Location: [facility];  Service: Cardiopulmonary;;   ENDOBRONCHIAL ULTRASOUND Bilateral 02/10/2022   Procedure: ENDOBRONCHIAL ULTRASOUND;  Surgeon: Oretha Milch, MD;  Location: [facility];  Service: Cardiopulmonary;  Laterality: Bilateral;   LEG SURGERY     VIDEO BRONCHOSCOPY  02/10/2022   Procedure: VIDEO BRONCHOSCOPY WITHOUT FLUORO;  Surgeon: Oretha Milch, MD;  Location: [facility];  Service: Cardiopulmonary;;    REVIEW OF SYSTEMS:   Review of Systems  Constitutional: Negative for appetite change, chills, fatigue, fever and unexpected weight change.  HENT:   Negative for mouth sores, nosebleeds, sore throat and trouble swallowing.   Eyes: Negative for eye problems and icterus.  Respiratory: Negative for cough, hemoptysis, shortness of breath and wheezing.   Cardiovascular: Negative for chest pain and leg swelling.  Gastrointestinal: Negative for abdominal pain, constipation, diarrhea, nausea and vomiting.  Genitourinary: Negative for bladder incontinence, difficulty urinating, dysuria, frequency and hematuria.   Musculoskeletal: Negative for back pain, gait problem, neck pain and neck stiffness.  Skin: Negative for itching and rash.  Neurological: Negative for dizziness, extremity weakness, gait problem, headaches, light-headedness and seizures.  Hematological: Negative for adenopathy. Does not bruise/bleed easily.  Psychiatric/Behavioral: Negative for confusion, depression and sleep disturbance. The patient is not nervous/anxious.      PHYSICAL EXAMINATION:  There were no vitals taken for this visit.  ECOG PERFORMANCE STATUS: {CHL ONC ECOG [id number]  Physical Exam  Constitutional: Oriented to person, place, and time and well-developed, well-nourished, and in no distress. No distress.  HENT:  Head: Normocephalic and atraumatic.  Mouth/Throat: Oropharynx is clear and moist. No oropharyngeal exudate.  Eyes: Conjunctivae are normal. Right eye exhibits no discharge. Left eye exhibits no discharge. No scleral icterus.  Neck: Normal range of motion. Neck supple.  Cardiovascular: Normal rate, regular rhythm, normal heart sounds and intact distal pulses.   Pulmonary/Chest: Effort normal and breath sounds normal. No respiratory distress. No wheezes. No rales.  Abdominal: Soft. Bowel sounds are normal. Exhibits no distension and no mass. There is no tenderness.  Musculoskeletal: Normal range of motion. Exhibits no edema.  Lymphadenopathy:    No cervical adenopathy.  Neurological: Alert and oriented to person, place, and time. Exhibits normal muscle tone. Gait normal. Coordination normal.  Skin: Skin is warm and dry. No rash noted. Not diaphoretic. No erythema. No pallor.  Psychiatric: Mood, memory and judgment normal.  Vitals reviewed.  LABORATORY DATA: Lab Results  Component Value Date   WBC 6.9 09/17/2023   HGB 17.6 (H) 09/17/2023  HCT 54.0 (H) 09/17/2023   MCV 96.8 09/17/2023   PLT 206 09/17/2023      Chemistry      Component Value Date/Time   NA 136 09/17/2023 0235   K 4.3 09/17/2023 0235   CL 102 09/17/2023 0235   CO2 24 09/17/2023 0235   BUN 11 09/17/2023 0235   CREATININE 0.96 09/17/2023 0235   CREATININE 0.88 08/18/2023 0941   CREATININE 0.91 12/05/2013 1605      Component Value Date/Time   CALCIUM 8.7 (L) 09/17/2023 0235   ALKPHOS 88 08/18/2023 0941   AST 15 08/18/2023 0941   ALT 9 08/18/2023 0941   BILITOT 0.4 08/18/2023 0941       RADIOGRAPHIC STUDIES:  DG Chest 2 View  Result  Date: 10/16/2023 CLINICAL DATA:  Shortness of breath with a history of right upper lobe non-small cell lung cancer. EXAM: CHEST - 2 VIEW COMPARISON:  Chest CT with contrast 09/16/2023 FINDINGS: 5 x 2.8 cm medial right upper lobe perihilar mass with XRT changes and stranding to the pleural surface, is again noted with stable posttreatment appearance. There is a mild degree of right upper lobe volume loss in a cephalad hilar retraction. The lungs emphysematous and otherwise clear. There is a stable mediastinum with aortic uncoiling and calcific plaque of the transverse segment. The cardiac size is normal, was purposely slightly enlarged. No vascular congestion is seen or pleural effusion. No new osseous findings.  Thoracic spondylosis. IMPRESSION: 1. No acute chest findings. 2. Stable posttreatment appearance of the right upper lobe perihilar mass, with mild associated right upper lobe volume loss. 3. Emphysema. 4. Aortic atherosclerosis. Electronically Signed   By: Almira Bar M.D.   On: 10/16/2023 01:22     ASSESSMENT/PLAN:  This is a very pleasant 52 years old African-American male with stage IIIb (T4, N2, M0) non-small cell lung cancer, poorly differentiated carcinoma with neuroendocrine features diagnosed in January 2024 and presented with large right lower lobe lung mass in addition to right hilar and mediastinal lymphadenopathy. He is status post short course of palliative radiotherapy with 30 Richard [name] completed in March 2023.   He completed concurrent chemoradiation with weekly carboplatin for AUC of 2 1 paclitaxel 45 Mg/M2 status post 4 cycles. He was noncompliant with his chemotherapy treatments and received only 3 out of the schedule 7 cycles. Last cycle was given on March 15, 2023.   His scan showed decrease in the size of the right upper lung retrohilar mass as well as decrease in the size of the AP window lymph nodes and no evidence for progression. The patient is currently on treatment with  consolidation immunotherapy with Imfinzi 1500 Mg IV every 4 weeks.  He is status post 3 cycles. He has not received this consisently due to transportation constraints.   Since the patient is not receiving treatment consistently and has not had treatment is several  months and not able to come in for toxicity evaluation, we will discontinue treatment and continue on observation.  I will arrange for a restaging CT scan and follow up in 1 month to discuss next steps.   BP ***  The patient was advised to call immediately if she has any concerning symptoms in the interval. The patient voices understanding of current disease status and treatment options and is in agreement with the current care plan. All questions were answered. The patient knows to call the clinic with any problems, questions or concerns. We can certainly see the patient much sooner if  necessary       No orders of the defined types were placed in this encounter.    I spent {CHL ONC TIME VISIT - ZOXWR:[id number]} counseling the patient face to face. The total time spent in the appointment was {CHL ONC TIME VISIT - BJYNW:[id number]}.  Elridge Stemm L Kenji Mapel, PA-C 11/07/23

## 2023-11-08 ENCOUNTER — Encounter: Payer: Self-pay | Admitting: Internal Medicine

## 2023-11-10 ENCOUNTER — Telehealth: Payer: Self-pay | Admitting: Physician Assistant

## 2023-11-10 ENCOUNTER — Telehealth: Payer: Self-pay

## 2023-11-10 ENCOUNTER — Inpatient Hospital Stay: Payer: 59 | Admitting: Physician Assistant

## 2023-11-10 ENCOUNTER — Inpatient Hospital Stay: Payer: 59

## 2023-11-10 NOTE — Telephone Encounter (Signed)
Patient is aware of scheduled appointment times/dates

## 2023-11-10 NOTE — Telephone Encounter (Signed)
LVM for patient to return call about missed appts today.

## 2023-11-10 NOTE — Telephone Encounter (Signed)
PT called and I said he missed his appt today ." They told me it was the 27th. " I told him today is the 27th . " I m sorry I overslept". Then he said " my transportation didn't come" I told [place] we will r/s him.

## 2023-11-11 ENCOUNTER — Other Ambulatory Visit: Payer: Self-pay

## 2023-11-22 ENCOUNTER — Ambulatory Visit: Payer: 59 | Admitting: Adult Health

## 2023-11-23 ENCOUNTER — Encounter: Payer: Self-pay | Admitting: Adult Health

## 2023-11-25 ENCOUNTER — Other Ambulatory Visit: Payer: Self-pay

## 2023-11-26 NOTE — Progress Notes (Unsigned)
[facility] OFFICE PROGRESS NOTE  Pcp, No No address on file  DIAGNOSIS: stage IIIb (T4, N2, M0) non-small cell lung cancer, poorly differentiated carcinoma with neuroendocrine features diagnosed in January 2024 and presented with large right lower lobe lung mass in addition to right hilar and mediastinal lymphadenopathy.   PRIOR THERAPY: 1) status post short course of palliative radiotherapy with 30 Gray completed in March 2023.  2) A course of concurrent chemoradiation with weekly carboplatin for AUC of 2 and paclitaxel 45 Mg/M2.  Status post 4 cycles  CURRENT THERAPY: Consolidation treatment with immunotherapy with Imfinzi 1500 Mg IV every 4 weeks.  First dose 04/29/2023.  Status post 3 cycles.  Patient inconsistent with compliance with his appointments and treatments.  Therefore his treatment was discontinued due to not receiving this on a regular basis ***  INTERVAL HISTORY: Richard [name] 52 y.o. male returns to the clinic today for follow-up visit.  The patient was lost to follow-up since August 2024.  The patient is inconsistent and not compliant with his appointments.  He was supposed to be undergoing monthly immunotherapy with Imfinzi which was started in May 2024 and he has only received 3 total treatments, the last being in September 2024.  Only interval since last being seen, he did present to the emergency room on 10/03/2023 for the chief complaint of shortness of breath.  Of note his most recent CT scan on 09/16/2023 showed continued decrease in size of the posterior right upper lobe mass he was treated with antibiotics for possible pneumonia.  The patient is on supplemental oxygen.  He also has inhalers.  He reach out to pulmonary medicine who recommended seeing him back sooner but it does not look like the patient had been seen possibly due to not showing up for his visit.  The patient struggles with transportation.  Today, the patient is feeling ***.  He denies any  fever, chills, or night sweats.  Hypertension?  Chest pain?  Shortness of breath?  Cough?  Denies any hemoptysis.  Denies any nausea, vomiting, diarrhea, or constipation.  Denies any headache or visual changes.  Denies any rashes or skin changes.  He is here today for evaluation and to discuss the next steps in his care  MEDICAL HISTORY: Past Medical History:  Diagnosis Date   Asthma    Brain bleed (HCC)    DVT (deep venous thrombosis) (HCC) 02/19/2015   RLE   GERD (gastroesophageal reflux disease)    History of home oxygen therapy    Hypertension    Seizures (HCC)    last episode 03/2013   Stroke [facility])     ALLERGIES:  is allergic to dilaudid [hydromorphone hcl] and tape.  MEDICATIONS:  Current Outpatient Medications  Medication Sig Dispense Refill   albuterol (VENTOLIN HFA) 108 (90 Base) MCG/ACT inhaler Inhale 2 puffs into the lungs every 6 (six) hours as needed for wheezing or shortness of breath. 1 each 11   amLODipine (NORVASC) 10 MG tablet Take 10 mg by mouth every morning.     cloNIDine (CATAPRES) 0.1 MG tablet Take 0.1 mg by mouth. Take 2 tablets by mouth at 9am and 5pm and 1 tablet at bedtime     Fluticasone-Umeclidin-Vilant (TRELEGY ELLIPTA) 200-62.5-25 MCG/ACT AEPB Inhale 1 puff into the lungs daily. 60 each 11   hydrochlorothiazide (HYDRODIURIL) 12.5 MG tablet Take 12.5 mg by mouth every morning.     levETIRAcetam (KEPPRA) 500 MG tablet Take 1 tablet (500 mg total) by mouth 2 (  two) times daily.     losartan (COZAAR) 100 MG tablet Take 100 mg by mouth daily.     sertraline (ZOLOFT) 25 MG tablet Take 25 mg by mouth daily.     SPIRIVA RESPIMAT 2.5 MCG/ACT AERS Inhale 2.5 mcg into the lungs daily. 1 each 11   No current facility-administered medications for this visit.    SURGICAL HISTORY:  Past Surgical History:  Procedure Laterality Date   BRONCHIAL BIOPSY  02/10/2022   Procedure: BRONCHIAL BIOPSIES;  Surgeon: Oretha Milch, MD;  Location: [facility];  Service:  Cardiopulmonary;;   BRONCHIAL BRUSHINGS  02/10/2022   Procedure: BRONCHIAL BRUSHINGS;  Surgeon: Oretha Milch, MD;  Location: [facility];  Service: Cardiopulmonary;;   BRONCHIAL NEEDLE ASPIRATION BIOPSY  02/10/2022   Procedure: BRONCHIAL NEEDLE ASPIRATION BIOPSIES;  Surgeon: Oretha Milch, MD;  Location: [facility];  Service: Cardiopulmonary;;   BRONCHIAL WASHINGS  02/10/2022   Procedure: BRONCHIAL WASHINGS;  Surgeon: Oretha Milch, MD;  Location: [facility];  Service: Cardiopulmonary;;   ENDOBRONCHIAL ULTRASOUND Bilateral 02/10/2022   Procedure: ENDOBRONCHIAL ULTRASOUND;  Surgeon: Oretha Milch, MD;  Location: [facility];  Service: Cardiopulmonary;  Laterality: Bilateral;   LEG SURGERY     VIDEO BRONCHOSCOPY  02/10/2022   Procedure: VIDEO BRONCHOSCOPY WITHOUT FLUORO;  Surgeon: Oretha Milch, MD;  Location: [facility];  Service: Cardiopulmonary;;    REVIEW OF SYSTEMS:   Review of Systems  Constitutional: Negative for appetite change, chills, fatigue, fever and unexpected weight change.  HENT:   Negative for mouth sores, nosebleeds, sore throat and trouble swallowing.   Eyes: Negative for eye problems and icterus.  Respiratory: Negative for cough, hemoptysis, shortness of breath and wheezing.   Cardiovascular: Negative for chest pain and leg swelling.  Gastrointestinal: Negative for abdominal pain, constipation, diarrhea, nausea and vomiting.  Genitourinary: Negative for bladder incontinence, difficulty urinating, dysuria, frequency and hematuria.   Musculoskeletal: Negative for back pain, gait problem, neck pain and neck stiffness.  Skin: Negative for itching and rash.  Neurological: Negative for dizziness, extremity weakness, gait problem, headaches, light-headedness and seizures.  Hematological: Negative for adenopathy. Does not bruise/bleed easily.  Psychiatric/Behavioral: Negative for confusion, depression and sleep disturbance. The patient is not nervous/anxious.      PHYSICAL EXAMINATION:  There were no vitals taken for this visit.  ECOG PERFORMANCE STATUS: {CHL ONC ECOG [id number]  Physical Exam  Constitutional: Oriented to person, place, and time and well-developed, well-nourished, and in no distress. No distress.  HENT:  Head: Normocephalic and atraumatic.  Mouth/Throat: Oropharynx is clear and moist. No oropharyngeal exudate.  Eyes: Conjunctivae are normal. Right eye exhibits no discharge. Left eye exhibits no discharge. No scleral icterus.  Neck: Normal range of motion. Neck supple.  Cardiovascular: Normal rate, regular rhythm, normal heart sounds and intact distal pulses.   Pulmonary/Chest: Effort normal and breath sounds normal. No respiratory distress. No wheezes. No rales.  Abdominal: Soft. Bowel sounds are normal. Exhibits no distension and no mass. There is no tenderness.  Musculoskeletal: Normal range of motion. Exhibits no edema.  Lymphadenopathy:    No cervical adenopathy.  Neurological: Alert and oriented to person, place, and time. Exhibits normal muscle tone. Gait normal. Coordination normal.  Skin: Skin is warm and dry. No rash noted. Not diaphoretic. No erythema. No pallor.  Psychiatric: Mood, memory and judgment normal.  Vitals reviewed.  LABORATORY DATA: Lab Results  Component Value Date   WBC 6.9 09/17/2023   HGB 17.6 (H) 09/17/2023   HCT 54.0 (  H) 09/17/2023   MCV 96.8 09/17/2023   PLT 206 09/17/2023      Chemistry      Component Value Date/Time   NA 136 09/17/2023 0235   K 4.3 09/17/2023 0235   CL 102 09/17/2023 0235   CO2 24 09/17/2023 0235   BUN 11 09/17/2023 0235   CREATININE 0.96 09/17/2023 0235   CREATININE 0.88 08/18/2023 0941   CREATININE 0.91 12/05/2013 1605      Component Value Date/Time   CALCIUM 8.7 (L) 09/17/2023 0235   ALKPHOS 88 08/18/2023 0941   AST 15 08/18/2023 0941   ALT 9 08/18/2023 0941   BILITOT 0.4 08/18/2023 0941       RADIOGRAPHIC STUDIES:  No results  found.   ASSESSMENT/PLAN:  This is a very pleasant 52 years old African-American male with stage IIIb (T4, N2, M0) non-small cell lung cancer, poorly differentiated carcinoma with neuroendocrine features diagnosed in January 2024 and presented with large right lower lobe lung mass in addition to right hilar and mediastinal lymphadenopathy. He is status post short course of palliative radiotherapy with 30 Wallace Cullens completed in March 2023.   He completed concurrent chemoradiation with weekly carboplatin for AUC of 2 1 paclitaxel 45 Mg/M2 status post 4 cycles. He was noncompliant with his chemotherapy treatments and received only 3 out of the schedule 7 cycles. Last cycle was given on March 15, 2023.   His scan showed decrease in the size of the right upper lung retrohilar mass as well as decrease in the size of the AP window lymph nodes and no evidence for progression. The patient is currently on treatment with consolidation immunotherapy with Imfinzi 1500 Mg IV every 4 weeks.  He is status post 3 cycles. He has not received this consisently due to transportation constraints.   Since the patient is not receiving treatment consistently and has not had treatment is several  months and not able to come in for toxicity evaluation, we will discontinue treatment and continue on observation.  I will arrange for a restaging CT scan and follow up in 1 month to discuss next steps.   BP ***  The patient was advised to call immediately if she has any concerning symptoms in the interval. The patient voices understanding of current disease status and treatment options and is in agreement with the current care plan. All questions were answered. The patient knows to call the clinic with any problems, questions or concerns. We can certainly see the patient much sooner if necessary   No orders of the defined types were placed in this encounter.    I spent {CHL ONC TIME VISIT - VWUJW:[id number]} counseling the  patient face to face. The total time spent in the appointment was {CHL ONC TIME VISIT - AOZHY:[id number]}.  Wendie Diskin L Myrical Andujo, PA-C 11/26/23

## 2023-11-29 ENCOUNTER — Other Ambulatory Visit: Payer: Self-pay | Admitting: Physician Assistant

## 2023-11-29 ENCOUNTER — Encounter: Payer: Self-pay | Admitting: Internal Medicine

## 2023-11-30 ENCOUNTER — Inpatient Hospital Stay: Payer: 59

## 2023-11-30 ENCOUNTER — Inpatient Hospital Stay (HOSPITAL_BASED_OUTPATIENT_CLINIC_OR_DEPARTMENT_OTHER): Payer: 59 | Admitting: Physician Assistant

## 2023-11-30 ENCOUNTER — Inpatient Hospital Stay: Payer: 59 | Attending: Internal Medicine

## 2023-11-30 DIAGNOSIS — C3431 Malignant neoplasm of lower lobe, right bronchus or lung: Secondary | ICD-10-CM

## 2023-11-30 DIAGNOSIS — C7A1 Malignant poorly differentiated neuroendocrine tumors: Secondary | ICD-10-CM | POA: Insufficient documentation

## 2023-11-30 LAB — CMP (CANCER CENTER ONLY)
ALT: 11 U/L (ref 0–44)
AST: 15 U/L (ref 15–41)
Albumin: 4.1 g/dL (ref 3.5–5.0)
Alkaline Phosphatase: 69 U/L (ref 38–126)
Anion gap: 5 (ref 5–15)
BUN: 15 mg/dL (ref 6–20)
CO2: 26 mmol/L (ref 22–32)
Calcium: 9.3 mg/dL (ref 8.9–10.3)
Chloride: 109 mmol/L (ref 98–111)
Creatinine: 1.02 mg/dL (ref 0.61–1.24)
GFR, Estimated: >60 mL/min (ref >60)
Glucose, Bld: 78 mg/dL (ref 70–99)
Potassium: 4.5 mmol/L (ref 3.5–5.1)
Sodium: 140 mmol/L (ref 135–145)
Total Bilirubin: 0.3 mg/dL (ref <1.2)
Total Protein: 6.9 g/dL (ref 6.5–8.1)

## 2023-11-30 LAB — CBC WITH DIFFERENTIAL (CANCER CENTER ONLY)
Abs Immature Granulocytes: 0.05 10*3/uL (ref 0.00–0.07)
Basophils Absolute: 0 10*3/uL (ref 0.0–0.1)
Basophils Relative: 1 %
Eosinophils Absolute: 0 10*3/uL (ref 0.0–0.5)
Eosinophils Relative: 0 %
HCT: 47.9 % (ref 39.0–52.0)
Hemoglobin: 16.6 g/dL (ref 13.0–17.0)
Immature Granulocytes: 1 %
Lymphocytes Relative: 11 %
Lymphs Abs: 0.6 10*3/uL — ABNORMAL LOW (ref 0.7–4.0)
MCH: 34.1 pg — ABNORMAL HIGH (ref 26.0–34.0)
MCHC: 34.7 g/dL (ref 30.0–36.0)
MCV: 98.4 fL (ref 80.0–100.0)
Monocytes Absolute: 0.7 10*3/uL (ref 0.1–1.0)
Monocytes Relative: 14 %
Neutro Abs: 3.7 10*3/uL (ref 1.7–7.7)
Neutrophils Relative %: 73 %
Platelet Count: 219 10*3/uL (ref 150–400)
RBC: 4.87 MIL/uL (ref 4.22–5.81)
RDW: 15.4 % (ref 11.5–15.5)
WBC Count: 5 10*3/uL (ref 4.0–10.5)
nRBC: 0 % (ref 0.0–0.2)

## 2023-12-15 ENCOUNTER — Emergency Department (HOSPITAL_COMMUNITY)
Admission: EM | Admit: 2023-12-15 | Discharge: 2023-12-15 | Discharge status: Against Medical Advice | Payer: 59 | Attending: Emergency Medicine | Admitting: Emergency Medicine

## 2023-12-15 DIAGNOSIS — R600 Localized edema: Secondary | ICD-10-CM | POA: Diagnosis not present

## 2023-12-15 DIAGNOSIS — R0602 Shortness of breath: Secondary | ICD-10-CM | POA: Insufficient documentation

## 2023-12-15 DIAGNOSIS — Z5321 Procedure and treatment not carried out due to patient leaving prior to being seen by health care provider: Secondary | ICD-10-CM | POA: Diagnosis not present

## 2023-12-15 NOTE — ED Triage Notes (Signed)
 EMS and PD brought patient from home after patient called out for SOB and leg swelling. On arrival, EMS states patient was not responding to verbal stimuli or shaking. Upon sternal rub patient opened eyes and threatened EMS stating I'm going to beat your ass if you do that again. Pt then stopped responding and closed eyes again. EMS attempted verbal stimuli and shaking patient, resulted in sternal rub again. At this point patient stood out of chair and swung at EMS. EMS left home. Patient called back 10 minutes later with same complaint, plus CP d/t sternal rub per patient. On arrival for second call EMS found that patient had put himself on 6L home O2. Was on RA initially with no acute respiratory distress noted. NAD noted on arrival to ED. Pt asking to leave on arrival.

## 2023-12-17 ENCOUNTER — Ambulatory Visit (HOSPITAL_COMMUNITY)
Admission: RE | Admit: 2023-12-17 | Discharge: 2023-12-17 | Disposition: A | Discharge status: Home / Self Care | Payer: 59 | Source: Ambulatory Visit | Attending: Physician Assistant | Admitting: Physician Assistant

## 2023-12-17 DIAGNOSIS — C3431 Malignant neoplasm of lower lobe, right bronchus or lung: Secondary | ICD-10-CM | POA: Diagnosis present

## 2023-12-17 MED ORDER — IOHEXOL 300 MG/ML  SOLN
75.0000 mL | Freq: Once | INTRAMUSCULAR | Status: AC | PRN
  Administered 2023-12-17: 75 mL via INTRAVENOUS

## 2023-12-30 ENCOUNTER — Inpatient Hospital Stay: Payer: 59 | Admitting: Internal Medicine

## 2023-12-30 ENCOUNTER — Inpatient Hospital Stay: Payer: 59 | Attending: Internal Medicine

## 2023-12-30 DIAGNOSIS — C7A1 Malignant poorly differentiated neuroendocrine tumors: Secondary | ICD-10-CM | POA: Insufficient documentation

## 2024-01-04 ENCOUNTER — Telehealth: Payer: Self-pay | Admitting: Internal Medicine

## 2024-01-04 NOTE — Progress Notes (Deleted)
 [address]       Richard [name] [address]             [phone]    PCP is Pcp, No Referring Provider is Johnette Abraham Heilingoetter , PA-C   Chief Complaint: Ascending thoracic aortic aneurysm   HPI: This is a 53 year old male with a past medical history of hypertension, brain bleed, DVT, seizure, stroke, tobacco abuse, and stage IIIb (T4, N2, M0) non-small cell lung cancer, poorly differentiated carcinoma with neuroendocrine features diagnosed in January 2024. He also was found to have a large right lower lobe lung mass in addition to right hilar and mediastinal lymphadenopathy. C. Heilingoetter and Dr. Si Gaul are following him for the aforementioned. Per oncology note, since the patient is not receiving treatment consistently and has not had treatment is several months and not able to come in for toxicity evaluation,  treatment was discontinued. His most scan showed decrease in the size of the right upper lung retrohilar mass as well as decrease in the size of the AP window lymph nodes and no evidence for progression. On CT of the chest monitoring his lung cancer, he was incidentally found to have an ascending thoracic aortic aneurysm. He presents today for evaluation and to establish surveillance of the ATAA. He denies chest pain, pressure, or tightness.  Past Medical History:  Diagnosis Date   Asthma    Brain bleed (HCC)    DVT (deep venous thrombosis) (HCC) 02/19/2015   RLE   GERD (gastroesophageal reflux disease)    History of home oxygen therapy    Hypertension    Seizures (HCC)    last episode 03/2013   Stroke [facility])     Past Surgical History:  Procedure Laterality Date   BRONCHIAL BIOPSY  02/10/2022   Procedure: BRONCHIAL BIOPSIES;  Surgeon: Oretha Milch, MD;  Location: [facility];  Service: Cardiopulmonary;;   BRONCHIAL BRUSHINGS  02/10/2022   Procedure: BRONCHIAL BRUSHINGS;  Surgeon: Oretha Milch, MD;  Location: [facility];   Service: Cardiopulmonary;;   BRONCHIAL NEEDLE ASPIRATION BIOPSY  02/10/2022   Procedure: BRONCHIAL NEEDLE ASPIRATION BIOPSIES;  Surgeon: Oretha Milch, MD;  Location: [facility];  Service: Cardiopulmonary;;   BRONCHIAL WASHINGS  02/10/2022   Procedure: BRONCHIAL WASHINGS;  Surgeon: Oretha Milch, MD;  Location: [facility];  Service: Cardiopulmonary;;   ENDOBRONCHIAL ULTRASOUND Bilateral 02/10/2022   Procedure: ENDOBRONCHIAL ULTRASOUND;  Surgeon: Oretha Milch, MD;  Location: [facility];  Service: Cardiopulmonary;  Laterality: Bilateral;   LEG SURGERY     VIDEO BRONCHOSCOPY  02/10/2022   Procedure: VIDEO BRONCHOSCOPY WITHOUT FLUORO;  Surgeon: Oretha Milch, MD;  Location: [facility];  Service: Cardiopulmonary;;    Family History  Problem Relation Age of Onset   Cancer Father    Diabetes Mellitus II Sister     Social History Social History   Tobacco Use   Smoking status: Every Day    Current packs/day: 1.00    Average packs/day: 1 pack/day for 38.1 years (38.1 ttl pk-yrs)    Types: Cigarettes    Start date: 1987   Smokeless tobacco: Never   Tobacco comments:    Pt states he smokes some days  Vaping Use   Vaping status: Never Used  Substance Use Topics   Alcohol use: Yes    Comment: 01/29/22- last time 6 days ago   Drug use: Not Currently    Types: Cocaine    Comment: 09/2021- last time for  cocaine    Current Outpatient Medications  Medication Sig Dispense Refill   albuterol (VENTOLIN HFA) 108 (90 Base) MCG/ACT inhaler Inhale 2 puffs into the lungs every 6 (six) hours as needed for wheezing or shortness of breath. 1 each 11   amLODipine (NORVASC) 10 MG tablet Take 10 mg by mouth every morning.     cloNIDine (CATAPRES) 0.1 MG tablet Take 0.1 mg by mouth. Take 2 tablets by mouth at 9am and 5pm and 1 tablet at bedtime     Fluticasone-Umeclidin-Vilant (TRELEGY ELLIPTA) 200-62.5-25 MCG/ACT AEPB Inhale 1 puff into the lungs daily. 60 each 11   hydrochlorothiazide  (HYDRODIURIL) 12.5 MG tablet Take 12.5 mg by mouth every morning.     levETIRAcetam (KEPPRA) 500 MG tablet Take 1 tablet (500 mg total) by mouth 2 (two) times daily.     losartan (COZAAR) 100 MG tablet Take 100 mg by mouth daily.     sertraline (ZOLOFT) 25 MG tablet Take 25 mg by mouth daily.     SPIRIVA RESPIMAT 2.5 MCG/ACT AERS Inhale 2.5 mcg into the lungs daily. 1 each 11   Allergies  Allergen Reactions   Dilaudid [Hydromorphone Hcl] Nausea Only and Other (See Comments)    Bradycardia and hyperthermia, too   Tape Itching and Dermatitis    Review of Systems Chest Pain [  ] Resting SOB [ ]  Exertional SOB [  ]  Pedal Edema [  ] Syncope [  ] Presyncope [  ]  General Review of Systems: [Y] = yes [ N]=no  Consitutional:   nausea [ ] ;  fever [ ] ;  Eye : blurred vision [ ] ; Amaurosis fugax[  ];  Resp: cough [ ] ;  hemoptysis[ ] ;  GI: vomiting[ ] ; melena[ ] ; hematochezia [] ;  [id number] ; Musculoskeletal: myalgias[ ] ; joint swelling[  ]; joint erythema[ ] ;  Heme/Lymph: anemia[ ] ;  Neuro: TIA[ ] ;stroke[ ] ;  seizures[ ] ;  Endocrine: diabetes[ ] ;    Vital Signs:  Physical Exam: CV- Neck- Pulmonary- Abdomen- Extremities- Neurologic-   Diagnostic Tests:  Narrative & Impression  CLINICAL DATA:  Non-small cell lung cancer, nonmetastatic, assess treatment response.   EXAM: CT CHEST WITH CONTRAST   TECHNIQUE: Multidetector CT imaging of the chest was performed during intravenous contrast administration.   RADIATION DOSE REDUCTION: This exam was performed according to the departmental dose-optimization program which includes automated exposure control, adjustment of the mA and/or kV according to patient size and/or use of iterative reconstruction technique.   CONTRAST:  75mL OMNIPAQUE IOHEXOL 300 MG/ML  SOLN   COMPARISON:  09/16/2023.   FINDINGS: Cardiovascular: Heart size normal. No pericardial effusion. Ascending aorta measures 4.3 cm (coronal image 52).  Enlarged pulmonic trunk.   Mediastinum/Nodes: Thoracic inlet lymph nodes measure up to 9 mm on the left, stable. No pathologically enlarged mediastinal, hilar or axillary lymph nodes. Esophagus is grossly unremarkable.   Lungs/Pleura: Centrilobular emphysema. Posterior segment right upper lobe nodule measures 7 mm (6/48), stable. Adjacent architectural distortion. Masslike consolidation in the perihilar right upper and right lower lobes, unchanged and presumably treatment related. Trace loculated right pleural fluid. No new pulmonary nodules. No left pleural fluid. Narrowing of the right middle lobe bronchus, unchanged. Airway is otherwise unremarkable.   Upper Abdomen: Low-attenuation thickening of the right adrenal gland. Left adrenal mass measures 2.1 x 4.4 cm and 16 Hounsfield units. No specific follow-up necessary. Visualized portions of the liver, gallbladder, adrenal glands, kidneys, spleen, pancreas, stomach and bowel are otherwise grossly unremarkable. No upper abdominal adenopathy.  Musculoskeletal: Degenerative changes in the spine.   IMPRESSION: 1. Stable 5 mm right upper lobe nodule with surrounding post treatment scarring. 2. Stable post treatment masslike consolidation in the perihilar right upper and right lower lobes. 3. No evidence of metastatic disease. 4. 4.3 cm ascending aortic aneurysm. Recommend annual imaging followup by CTA or MRA. This recommendation follows 2010 ACCF/AHA/AATS/ACR/ASA/SCA/SCAI/SIR/STS/SVM Guidelines for the Diagnosis and Management of Patients with Thoracic Aortic Disease. Circulation. 2010; 121: N829-F621. Aortic aneurysm NOS (ICD10-I71.9). 5. Left adrenal adenoma. 6. Enlarged pulmonic trunk, indicative of pulmonary arterial hypertension. 7.  Emphysema (ICD10-J43.9).     Electronically Signed   By: Leanna Battles M.D.   On: 12/27/2023 10:11  Risk Modification in those with ascending thoracic aortic aneurysm:  Continue good  control of blood pressure (prefer SBP 130/80 or less)-continue Amlodipine, Hydrochlorothiazide, and Losartan  2. Avoid fluoroquinolone antibiotics (I.e Ciprofloxacin, Avelox, Levofloxacin, Ofloxacin)  3.  Use of statin (to decrease cardiovascular risk)-will defer to PCP for lipid profile and whether or not to initiate statin  4.  Exercise and activity limitations is individualized, but in general, contact sports are to be  avoided and one should avoid heavy lifting (defined as half of ideal body weight) and exercises involving sustained Valsalva maneuver.  5. Counseling for those suspected of having genetically mediated disease. First-degree relatives of those with TAA disease should be screened as well as those who have a connective tissue disease (I.e with Marfan syndrome, Ehlers-Danlos syndrome,  and Loeys-Dietz syndrome) or a  bicuspid aortic valve,have an increased risk for  complications related to TAA. He has no history of family connective tissue disease or ATAA. Echo done January 2024 showed aortic valve with in determinant amount of cusps,  mild aortic insufficiency, and no aortic stenosis.  6. Patient with tobacco abuse. Cessation was encouraged.   Impression and Plan: CT of the chest shows 4.3 cm ascending aortic aneurysm.  Echocardiogram done January 2024 showed aortic valve with in determinant amount of cusps,  mild aortic insufficiency, and no aortic stenosis.We discussed the natural history and and risk factors for growth of ascending aortic aneurysms.  We covered the importance of smoking cessation, tight blood pressure control, refraining from lifting heavy objects, and avoiding fluoroquinolones.  The patient is aware of signs and symptoms of aortic dissection and when to present to the emergency department.  Pulmonary will continue to follow 5 mm RUL nodule. TCTS will continue surveillance and a repeat CT was ordered for 1 year.    Ardelle Balls, PA-C [facility] [phone]

## 2024-01-05 NOTE — Progress Notes (Signed)
@Patient  ID: Richard [name], male    DOB: [birth year], 53 y.o.   MRN: [id number]  Chief Complaint  Patient presents with   Follow-up    Pt is here for Hospital F/U visit. Pt c/o Coughing with white sputum.    Referring provider: No ref. provider found  HPI:   53 y.o. man whom are seen for hospital follow-up for pneumonia.  Most recent for note Dr. Delton Coombes reviewed.  Discharge summary reviewed.  The hospital virtually cough etc.  CT scan revealed scattered tree-in-bud nodularity concerning for pneumonia.  Mild oxygen increased 3 L to 4 L.  Felt better with increased oxygen.  Treated with antibiotics.  Felt better.  Discharged home.  Currently on low-dose Trelegy as well as Spiriva.  Still with some productive cough.  White phlegm.  Discussed role and rationale for escalate inhaler regimen prickly increasing ICS given with ongoing sputum production.  Discussed risk of ICS in the setting of history of pneumonia.  But given significant symptom burden worth trying.    Questionaires / Pulmonary Flowsheets:   ACT:      No data to display          MMRC:     No data to display          Epworth:      No data to display          Tests:   FENO:  No results found for: "NITRICOXIDE"  PFT:    Latest Ref Rng & Units 10/15/2022   10:22 AM  PFT Results  FVC-Pre L 2.76   FVC-Predicted Pre % 53   FVC-Post L 2.81   FVC-Predicted Post % 54   Pre FEV1/FVC % % 65   Post FEV1/FCV % % 66   FEV1-Pre L 1.79   FEV1-Predicted Pre % 44   FEV1-Post L 1.86   DLCO uncorrected ml/min/mmHg 8.30   DLCO UNC% % 27   DLCO corrected ml/min/mmHg 8.30   DLCO COR %Predicted % 27   DLVA Predicted % 39   TLC L 4.76   TLC % Predicted % 66   RV % Predicted % 88   Personally reviewed and interpreted as Severe fixed obstruction, lung volumes consistent with mild restriction, DLCO severely reduced  WALK:     12/30/2022    3:15 PM  SIX MIN WALK  Supplimental Oxygen during Test? (L/min) Yes   O2 Flow Rate 4 L/min  Type Pulse  Tech Comments: Pt was able to complete 3 laps at mod pace without any stops needed. Pt's O2 did drop to 88% on RA but recovered to 93% with application of 2 L via POC. Pt maintained 92% O2 with 4 L on POC    Imaging: Personally reviewed and as per EMR and discussion in this note  Lab Results: Personally reviewed CBC    Component Value Date/Time   WBC 5.0 11/30/2023 1244   WBC 6.9 09/17/2023 0235   RBC 4.87 11/30/2023 1244   HGB 16.6 11/30/2023 1244   HCT 47.9 11/30/2023 1244   PLT 219 11/30/2023 1244   MCV 98.4 11/30/2023 1244   MCH 34.1 (H) 11/30/2023 1244   MCHC 34.7 11/30/2023 1244   RDW 15.4 11/30/2023 1244   LYMPHSABS 0.6 (L) 11/30/2023 1244   MONOABS 0.7 11/30/2023 1244   EOSABS 0.0 11/30/2023 1244   BASOSABS 0.0 11/30/2023 1244    BMET    Component Value Date/Time   NA 140 11/30/2023 1244   K 4.5 11/30/2023  1244   CL 109 11/30/2023 1244   CO2 26 11/30/2023 1244   GLUCOSE 78 11/30/2023 1244   BUN 15 11/30/2023 1244   CREATININE 1.02 11/30/2023 1244   CREATININE 0.91 12/05/2013 1605   CALCIUM 9.3 11/30/2023 1244   GFRNONAA >60 11/30/2023 1244   GFRNONAA >89 12/05/2013 1605   GFRAA >60 05/09/2020 0108   GFRAA >89 12/05/2013 1605    BNP    Component Value Date/Time   BNP 179.8 (H) 01/09/2023 2240    ProBNP    Component Value Date/Time   PROBNP 36.0 08/15/2014 1244    Specialty Problems       Pulmonary Problems   CHRONIC FRONTAL SINUSITIS   Annotation: Left Qualifier: Diagnosis of  By: Daphine Deutscher FNP, [place]        Bleeding from the nose   Acute pulmonary edema (HCC)   Atelectasis   Hypoxia   Mass of right lung   COPD exacerbation (HCC)   Malignant neoplasm of overlapping sites of right lung (HCC)   Obstructive pneumonia   Chronic respiratory failure with hypoxia (HCC)   Malignant neoplasm of right lung (HCC)   Acute bronchiolitis due to respiratory syncytial virus (RSV)   Acute hypoxic respiratory  failure (HCC)   Acute respiratory failure with hypoxia (HCC)   CAP (community acquired pneumonia)   Acute respiratory failure (HCC)   Malignant neoplasm of overlapping sites of lung (HCC)   Pneumonia of left lung due to infectious organism    Allergies  Allergen Reactions   Dilaudid [Hydromorphone Hcl] Nausea Only and Other (See Comments)    Bradycardia and hyperthermia, too   Tape Itching and Dermatitis    Immunization History  Administered Date(s) Administered   Influenza Whole 11/13/2010, 09/04/2019   Pneumococcal Polysaccharide-23 02/15/2015    Past Medical History:  Diagnosis Date   Asthma    Brain bleed (HCC)    DVT (deep venous thrombosis) (HCC) 02/19/2015   RLE   GERD (gastroesophageal reflux disease)    History of home oxygen therapy    Hypertension    Seizures (HCC)    last episode 03/2013   Stroke (HCC)     Tobacco History: Social History   Tobacco Use  Smoking Status Every Day   Current packs/day: 1.00   Average packs/day: 1 pack/day for 38.1 years (38.1 ttl pk-yrs)   Types: Cigarettes   Start date: 44  Smokeless Tobacco Never  Tobacco Comments   Pt states he smokes some days   Ready to quit: Not Answered Counseling given: Not Answered Tobacco comments: Pt states he smokes some days   Continue to not smoke  Outpatient Encounter Medications as of 09/24/2023  Medication Sig   Fluticasone-Umeclidin-Vilant (TRELEGY ELLIPTA) 200-62.5-25 MCG/ACT AEPB Inhale 1 puff into the lungs daily.   albuterol (VENTOLIN HFA) 108 (90 Base) MCG/ACT inhaler Inhale 2 puffs into the lungs every 6 (six) hours as needed for wheezing or shortness of breath.   amLODipine (NORVASC) 10 MG tablet Take 10 mg by mouth every morning.   [EXPIRED] aspirin EC 81 MG tablet Take 1 tablet (81 mg total) by mouth daily. Swallow whole.   cloNIDine (CATAPRES) 0.1 MG tablet Take 0.1 mg by mouth. Take 2 tablets by mouth at 9am and 5pm and 1 tablet at bedtime   hydrochlorothiazide  (HYDRODIURIL) 12.5 MG tablet Take 12.5 mg by mouth every morning.   levETIRAcetam (KEPPRA) 500 MG tablet Take 1 tablet (500 mg total) by mouth 2 (two) times daily.   losartan (COZAAR)  100 MG tablet Take 100 mg by mouth daily.   sertraline (ZOLOFT) 25 MG tablet Take 25 mg by mouth daily.   SPIRIVA RESPIMAT 2.5 MCG/ACT AERS Inhale 2.5 mcg into the lungs daily.   [DISCONTINUED] albuterol (VENTOLIN HFA) 108 (90 Base) MCG/ACT inhaler Inhale 1-2 puffs into the lungs every 6 (six) hours as needed for wheezing or shortness of breath.   [DISCONTINUED] SPIRIVA RESPIMAT 2.5 MCG/ACT AERS Inhale 2.5 mcg into the lungs daily.   [DISCONTINUED] TRELEGY ELLIPTA 100-62.5-25 MCG/ACT AEPB Take 1 puff by mouth daily.   No facility-administered encounter medications on file as of 09/24/2023.     Review of Systems  Review of Systems  No chest pain with exertion.  No orthopnea or PND.  Comprehensive review of systems otherwise negative. Physical Exam  BP (!) 160/94 (BP Location: Left Arm, Cuff Size: Normal)   Pulse (!) 102   Ht 5\' 9"  (1.753 m)   Wt 192 lb 12.8 oz (87.5 kg)   SpO2 (!) 81%   BMI 28.47 kg/m   Wt Readings from Last 5 Encounters:  09/24/23 192 lb 12.8 oz (87.5 kg)  09/16/23 195 lb (88.5 kg)  08/18/23 192 lb (87.1 kg)  08/03/23 193 lb (87.5 kg)  07/31/23 203 lb (92.1 kg)    BMI Readings from Last 5 Encounters:  09/24/23 28.47 kg/m  09/16/23 28.80 kg/m  08/18/23 28.35 kg/m  08/03/23 28.50 kg/m  07/31/23 29.98 kg/m     Physical Exam General: Sitting in chair, no acute distress Eyes: EOMI, no icterus CV: Warm, no edema Pulmonary: Distant, clear, no work of breathing on nasal cannula Abdomen: Nondistended, bowel sounds present MSK: No synovitis, no joint effusion    Assessment & Plan:   Pneumonia: Patient with tree-in-bud infiltrates.  Treated with antibiotics.  COPD: With recent admission concern for exacerbation.  Escalate to triple inhaled therapy with Trelegy  high-dose.  Encouraged albuterol use breakthrough during the day.  Has been using Trelegy as breakthrough and running out of supply.  He is also continuing Spiriva which is okay given significant symptoms.  Unclear if this really benefiting or not.  Chronic hypoxemic respite failure: Continue to use, historically 3 L.  O2 sat goal 88%.   Return in about 3 months (around 12/25/2023) for Follow-up Dr. Delton Coombes.   Karren Burly, MD 01/05/2024   This appointment required 42 minutes of patient care (this includes precharting, chart review, review of results, face-to-face care, etc.).

## 2024-01-12 ENCOUNTER — Inpatient Hospital Stay (HOSPITAL_BASED_OUTPATIENT_CLINIC_OR_DEPARTMENT_OTHER): Payer: 59 | Admitting: Internal Medicine

## 2024-01-12 ENCOUNTER — Inpatient Hospital Stay: Payer: 59

## 2024-01-12 VITALS — BP 152/110 | HR 88 | Temp 98.3°F | Resp 17 | Ht 69.0 in | Wt 200.1 lb

## 2024-01-12 DIAGNOSIS — C3490 Malignant neoplasm of unspecified part of unspecified bronchus or lung: Secondary | ICD-10-CM | POA: Diagnosis not present

## 2024-01-12 DIAGNOSIS — C3431 Malignant neoplasm of lower lobe, right bronchus or lung: Secondary | ICD-10-CM

## 2024-01-12 DIAGNOSIS — C7A1 Malignant poorly differentiated neuroendocrine tumors: Secondary | ICD-10-CM | POA: Diagnosis present

## 2024-01-12 LAB — CMP (CANCER CENTER ONLY)
ALT: 11 U/L (ref 0–44)
AST: 19 U/L (ref 15–41)
Albumin: 4.1 g/dL (ref 3.5–5.0)
Alkaline Phosphatase: 68 U/L (ref 38–126)
Anion gap: 6 (ref 5–15)
BUN: 12 mg/dL (ref 6–20)
CO2: 27 mmol/L (ref 22–32)
Calcium: 9.4 mg/dL (ref 8.9–10.3)
Chloride: 107 mmol/L (ref 98–111)
Creatinine: 1 mg/dL (ref 0.61–1.24)
GFR, Estimated: >60 mL/min
Glucose, Bld: 101 mg/dL — ABNORMAL HIGH (ref 70–99)
Potassium: 4.6 mmol/L (ref 3.5–5.1)
Sodium: 140 mmol/L (ref 135–145)
Total Bilirubin: 0.7 mg/dL (ref 0.0–1.2)
Total Protein: 7.4 g/dL (ref 6.5–8.1)

## 2024-01-12 LAB — CBC WITH DIFFERENTIAL (CANCER CENTER ONLY)
Abs Immature Granulocytes: 0.01 K/uL (ref 0.00–0.07)
Basophils Absolute: 0 K/uL (ref 0.0–0.1)
Basophils Relative: 0 %
Eosinophils Absolute: 0 K/uL (ref 0.0–0.5)
Eosinophils Relative: 0 %
HCT: 51.1 % (ref 39.0–52.0)
Hemoglobin: 16.8 g/dL (ref 13.0–17.0)
Immature Granulocytes: 0 %
Lymphocytes Relative: 14 %
Lymphs Abs: 0.7 K/uL (ref 0.7–4.0)
MCH: 33.1 pg (ref 26.0–34.0)
MCHC: 32.9 g/dL (ref 30.0–36.0)
MCV: 100.8 fL — ABNORMAL HIGH (ref 80.0–100.0)
Monocytes Absolute: 0.7 K/uL (ref 0.1–1.0)
Monocytes Relative: 13 %
Neutro Abs: 3.6 K/uL (ref 1.7–7.7)
Neutrophils Relative %: 73 %
Platelet Count: 207 K/uL (ref 150–400)
RBC: 5.07 MIL/uL (ref 4.22–5.81)
RDW: 15.3 % (ref 11.5–15.5)
WBC Count: 4.9 K/uL (ref 4.0–10.5)
nRBC: 0 % (ref 0.0–0.2)

## 2024-01-12 NOTE — Progress Notes (Signed)
[facility] [phone]   Fax:([phone]) [phone]  OFFICE PROGRESS NOTE  Pcp, No No address on file  DIAGNOSIS: stage IIIb (T4, N2, M0) non-small cell lung cancer, poorly differentiated carcinoma with neuroendocrine features diagnosed in January 2024 and presented with large right lower lobe lung mass in addition to right hilar and mediastinal lymphadenopathy.  PRIOR THERAPY:   1) status post short course of palliative radiotherapy with 30 Gray completed in March 2023.  2) A course of concurrent chemoradiation with weekly carboplatin for AUC of 2 and paclitaxel 45 Mg/M2.  Status post 4 cycles. 3) Consolidation treatment with immunotherapy with Imfinzi 1500 Mg IV every 4 weeks.  First dose 04/29/2023.  Status post 3 cycles.  Patient inconsistent with compliance with his appointments and treatments.  Therefore his treatment was discontinued due to not receiving this on a regular basis   CURRENT THERAPY: Observation    INTERVAL HISTORY: Richard [name] 53 y.o. male returns to the clinic today for follow-up visit.Discussed the use of AI scribe software for clinical note transcription with the patient, who gave verbal consent to proceed.  History of Present Illness   He is a 53 year old male with stage III B non-small cell lung cancer who presents for follow-up after recent imaging.  He was diagnosed with stage III B non-small cell lung cancer in January 2024 and underwent chemotherapy and radiation with weekly carboplatin and paclitaxel, followed by three cycles of immunotherapy with Imfinzi. He missed several cycles of immunotherapy, leading to a decision to hold further treatment. A recent chest scan performed a few weeks ago showed no growth or spread of the cancer.  He feels good overall with no new complaints since his last visit in December. He experiences some shortness of breath but denies any chest pain. He uses oxygen at home. No nausea, vomiting, or diarrhea.  He reports occasional headaches but no changes in vision.  He mentions that his blood pressure has been high, attributing it to stress from personal responsibilities. He describes feeling 'aggravated' by people asking him to perform various duties.  He has a history of a thoracic aortic aneurysm, with a 4.3 cm ascending aortic aneurysm that requires monitoring. He is followed by Dr. Clelia Croft for these conditions.       MEDICAL HISTORY: Past Medical History:  Diagnosis Date   Asthma    Brain bleed (HCC)    DVT (deep venous thrombosis) (HCC) 02/19/2015   RLE   GERD (gastroesophageal reflux disease)    History of home oxygen therapy    Hypertension    Seizures (HCC)    last episode 03/2013   Stroke [facility])     ALLERGIES:  is allergic to dilaudid [hydromorphone hcl] and tape.  MEDICATIONS:  Current Outpatient Medications  Medication Sig Dispense Refill   albuterol (VENTOLIN HFA) 108 (90 Base) MCG/ACT inhaler Inhale 2 puffs into the lungs every 6 (six) hours as needed for wheezing or shortness of breath. 1 each 11   amLODipine (NORVASC) 10 MG tablet Take 10 mg by mouth every morning.     cloNIDine (CATAPRES) 0.1 MG tablet Take 0.1 mg by mouth. Take 2 tablets by mouth at 9am and 5pm and 1 tablet at bedtime     Fluticasone-Umeclidin-Vilant (TRELEGY ELLIPTA) 200-62.5-25 MCG/ACT AEPB Inhale 1 puff into the lungs daily. 60 each 11   hydrochlorothiazide (HYDRODIURIL) 12.5 MG tablet Take 12.5 mg by mouth every morning.     levETIRAcetam (KEPPRA) 500 MG  tablet Take 1 tablet (500 mg total) by mouth 2 (two) times daily.     losartan (COZAAR) 100 MG tablet Take 100 mg by mouth daily.     sertraline (ZOLOFT) 25 MG tablet Take 25 mg by mouth daily.     SPIRIVA RESPIMAT 2.5 MCG/ACT AERS Inhale 2.5 mcg into the lungs daily. 1 each 11   No current facility-administered medications for this visit.    SURGICAL HISTORY:  Past Surgical History:  Procedure Laterality Date   BRONCHIAL BIOPSY  02/10/2022    Procedure: BRONCHIAL BIOPSIES;  Surgeon: Oretha Milch, MD;  Location: [facility];  Service: Cardiopulmonary;;   BRONCHIAL BRUSHINGS  02/10/2022   Procedure: BRONCHIAL BRUSHINGS;  Surgeon: Oretha Milch, MD;  Location: [facility];  Service: Cardiopulmonary;;   BRONCHIAL NEEDLE ASPIRATION BIOPSY  02/10/2022   Procedure: BRONCHIAL NEEDLE ASPIRATION BIOPSIES;  Surgeon: Oretha Milch, MD;  Location: [facility];  Service: Cardiopulmonary;;   BRONCHIAL WASHINGS  02/10/2022   Procedure: BRONCHIAL WASHINGS;  Surgeon: Oretha Milch, MD;  Location: [facility];  Service: Cardiopulmonary;;   ENDOBRONCHIAL ULTRASOUND Bilateral 02/10/2022   Procedure: ENDOBRONCHIAL ULTRASOUND;  Surgeon: Oretha Milch, MD;  Location: [facility];  Service: Cardiopulmonary;  Laterality: Bilateral;   LEG SURGERY     VIDEO BRONCHOSCOPY  02/10/2022   Procedure: VIDEO BRONCHOSCOPY WITHOUT FLUORO;  Surgeon: Oretha Milch, MD;  Location: [facility];  Service: Cardiopulmonary;;    REVIEW OF SYSTEMS:  Constitutional: positive for fatigue Eyes: negative Ears, nose, mouth, throat, and face: negative Respiratory: positive for dyspnea on exertion Cardiovascular: negative Gastrointestinal: negative Genitourinary:negative Integument/breast: negative Hematologic/lymphatic: negative Musculoskeletal:negative Neurological: negative Behavioral/Psych: negative Endocrine: negative Allergic/Immunologic: negative   PHYSICAL EXAMINATION: General appearance: alert, cooperative, fatigued, and no distress Head: Normocephalic, without obvious abnormality, atraumatic Neck: no adenopathy, no JVD, supple, symmetrical, trachea midline, and thyroid not enlarged, symmetric, no tenderness/mass/nodules Lymph nodes: Cervical, supraclavicular, and axillary nodes normal. Resp: clear to auscultation bilaterally Back: symmetric, no curvature. ROM normal. No CVA tenderness. Cardio: regular rate and rhythm, S1, S2 normal, no murmur, click, rub  or gallop GI: soft, non-tender; bowel sounds normal; no masses,  no organomegaly Extremities: extremities normal, atraumatic, no cyanosis or edema Neurologic: Alert and oriented X 3, normal strength and tone. Normal symmetric reflexes. Normal coordination and gait  ECOG PERFORMANCE STATUS: 1 - Symptomatic but completely ambulatory  Blood pressure (!) 152/110, pulse 88, temperature 98.3 F (36.8 C), temperature source Temporal, resp. rate 17, height 5\' 9"  (1.753 m), weight 200 lb 1.6 oz (90.8 kg), SpO2 100%.  LABORATORY DATA: Lab Results  Component Value Date   WBC 4.9 01/12/2024   HGB 16.8 01/12/2024   HCT 51.1 01/12/2024   MCV 100.8 (H) 01/12/2024   PLT 207 01/12/2024      Chemistry      Component Value Date/Time   NA 140 11/30/2023 1244   K 4.5 11/30/2023 1244   CL 109 11/30/2023 1244   CO2 26 11/30/2023 1244   BUN 15 11/30/2023 1244   CREATININE 1.02 11/30/2023 1244   CREATININE 0.91 12/05/2013 1605      Component Value Date/Time   CALCIUM 9.3 11/30/2023 1244   ALKPHOS 69 11/30/2023 1244   AST 15 11/30/2023 1244   ALT 11 11/30/2023 1244   BILITOT 0.3 11/30/2023 1244       RADIOGRAPHIC STUDIES: CT Chest W Contrast Result Date: 12/27/2023 CLINICAL DATA:  Non-small cell lung cancer, nonmetastatic, assess treatment response. EXAM: CT CHEST WITH CONTRAST TECHNIQUE: Multidetector CT  imaging of the chest was performed during intravenous contrast administration. RADIATION DOSE REDUCTION: This exam was performed according to the departmental dose-optimization program which includes automated exposure control, adjustment of the mA and/or kV according to patient size and/or use of iterative reconstruction technique. CONTRAST:  75mL OMNIPAQUE IOHEXOL 300 MG/ML  SOLN COMPARISON:  09/16/2023. FINDINGS: Cardiovascular: Heart size normal. No pericardial effusion. Ascending aorta measures 4.3 cm (coronal image 52). Enlarged pulmonic trunk. Mediastinum/Nodes: Thoracic inlet lymph nodes  measure up to 9 mm on the left, stable. No pathologically enlarged mediastinal, hilar or axillary lymph nodes. Esophagus is grossly unremarkable. Lungs/Pleura: Centrilobular emphysema. Posterior segment right upper lobe nodule measures 7 mm (6/48), stable. Adjacent architectural distortion. Masslike consolidation in the perihilar right upper and right lower lobes, unchanged and presumably treatment related. Trace loculated right pleural fluid. No new pulmonary nodules. No left pleural fluid. Narrowing of the right middle lobe bronchus, unchanged. Airway is otherwise unremarkable. Upper Abdomen: Low-attenuation thickening of the right adrenal gland. Left adrenal mass measures 2.1 x 4.4 cm and 16 Hounsfield units. No specific follow-up necessary. Visualized portions of the liver, gallbladder, adrenal glands, kidneys, spleen, pancreas, stomach and bowel are otherwise grossly unremarkable. No upper abdominal adenopathy. Musculoskeletal: Degenerative changes in the spine. IMPRESSION: 1. Stable 5 mm right upper lobe nodule with surrounding post treatment scarring. 2. Stable post treatment masslike consolidation in the perihilar right upper and right lower lobes. 3. No evidence of metastatic disease. 4. 4.3 cm ascending aortic aneurysm. Recommend annual imaging followup by CTA or MRA. This recommendation follows 2010 ACCF/AHA/AATS/ACR/ASA/SCA/SCAI/SIR/STS/SVM Guidelines for the Diagnosis and Management of Patients with Thoracic Aortic Disease. Circulation. 2010; 121: Z610-R604. Aortic aneurysm NOS (ICD10-I71.9). 5. Left adrenal adenoma. 6. Enlarged pulmonic trunk, indicative of pulmonary arterial hypertension. 7.  Emphysema (ICD10-J43.9). Electronically Signed   By: Leanna Battles M.D.   On: 12/27/2023 10:11    ASSESSMENT AND PLAN: This is a very pleasant 53 years old African-American male with stage IIIb (T4, N2, M0) non-small cell lung cancer, poorly differentiated carcinoma with neuroendocrine features diagnosed in  January 2024 and presented with large right lower lobe lung mass in addition to right hilar and mediastinal lymphadenopathy.  He is status post short course of palliative radiotherapy with 30 Wallace Cullens completed in March 2023. He completed concurrent chemoradiation with weekly carboplatin for AUC of 2 1 paclitaxel 45 Mg/M2 status post 4 cycles.  He was noncompliant with his chemotherapy treatments and received only 3 out of the schedule 7 cycles.  Last cycle was given on March 15, 2023. His scan showed decrease in the size of the right upper lung retrohilar mass as well as decrease in the size of the AP window lymph nodes and no evidence for progression. The patient is currently on treatment with consolidation immunotherapy with Imfinzi 1500 Mg IV every 4 weeks.  He is status post 3 cycle. The patient has been off treatment since June 2024 because he missed a lot of appointments.  His treatment was discontinued secondary to noncompliance. He is currently on observation    Stage III B Non-Small Cell Lung Cancer Diagnosed January 2024. Underwent chemotherapy and radiation with weekly carboplatin and paclitaxel, followed by three cycles of Imfinzi. Missed several cycles, leading to a decision to hold further treatment and perform a scan. Recent scan shows no growth or spread of cancer. Reports feeling good with occasional shortness of breath and headaches. No chest pain, nausea, vomiting, or diarrhea. No treatment needed unless cancer progresses. - Monitor with follow-up  scan and lab work in three months - Schedule scan and lab work one week before next appointment  Thoracic Aortic Aneurysm 4.3 cm ascending aortic aneurysm requiring annual monitoring. No evidence of metastatic disease. Followed by Dr. Clelia Croft. - Inform family doctor about aneurysm - Continue annual monitoring  Hypertension Elevated blood pressure today, attributed to stress from personal responsibilities. - Monitor blood pressure and manage  stress levels  Follow-up - Schedule follow-up appointment in three months - Perform scan and lab work one week before next appointment.  The patient was advised to call immediately if he has any concerning symptoms in the interval.  The patient voices understanding of current disease status and treatment options and is in agreement with the current care plan.  All questions were answered. The patient knows to call the clinic with any problems, questions or concerns. We can certainly see the patient much sooner if necessary.  The total time spent in the appointment was 30 minutes.  Disclaimer: This note was dictated with voice recognition software. Similar sounding words can inadvertently be transcribed and may not be corrected upon review.

## 2024-01-18 ENCOUNTER — Encounter: Payer: Self-pay | Admitting: Surgery

## 2024-01-20 ENCOUNTER — Encounter: Payer: Self-pay | Admitting: Adult Health

## 2024-01-20 ENCOUNTER — Ambulatory Visit: Payer: 59 | Admitting: Adult Health

## 2024-02-07 ENCOUNTER — Other Ambulatory Visit: Payer: 59

## 2024-04-01 ENCOUNTER — Emergency Department (HOSPITAL_COMMUNITY)

## 2024-04-01 ENCOUNTER — Emergency Department (HOSPITAL_COMMUNITY)
Admission: EM | Admit: 2024-04-01 | Discharge: 2024-04-02 | Disposition: A | Discharge status: Home / Self Care | Attending: Emergency Medicine | Admitting: Emergency Medicine

## 2024-04-01 DIAGNOSIS — R55 Syncope and collapse: Secondary | ICD-10-CM | POA: Insufficient documentation

## 2024-04-01 DIAGNOSIS — R0902 Hypoxemia: Secondary | ICD-10-CM | POA: Insufficient documentation

## 2024-04-01 DIAGNOSIS — Z79899 Other long term (current) drug therapy: Secondary | ICD-10-CM | POA: Insufficient documentation

## 2024-04-01 DIAGNOSIS — Y906 Blood alcohol level of 120-199 mg/100 ml: Secondary | ICD-10-CM | POA: Diagnosis not present

## 2024-04-01 DIAGNOSIS — F10920 Alcohol use, unspecified with intoxication, uncomplicated: Secondary | ICD-10-CM | POA: Insufficient documentation

## 2024-04-01 DIAGNOSIS — R456 Violent behavior: Secondary | ICD-10-CM | POA: Diagnosis present

## 2024-04-01 DIAGNOSIS — R0602 Shortness of breath: Secondary | ICD-10-CM | POA: Diagnosis not present

## 2024-04-01 LAB — COMPREHENSIVE METABOLIC PANEL WITH GFR
ALT: 12 U/L (ref 0–44)
AST: 20 U/L (ref 15–41)
Albumin: 3.3 g/dL — ABNORMAL LOW (ref 3.5–5.0)
Alkaline Phosphatase: 67 U/L (ref 38–126)
Anion gap: 13 (ref 5–15)
BUN: 8 mg/dL (ref 6–20)
CO2: 23 mmol/L (ref 22–32)
Calcium: 8.6 mg/dL — ABNORMAL LOW (ref 8.9–10.3)
Chloride: 105 mmol/L (ref 98–111)
Creatinine, Ser: 1.07 mg/dL (ref 0.61–1.24)
GFR, Estimated: >60 mL/min (ref >60)
Glucose, Bld: 95 mg/dL (ref 70–99)
Potassium: 4.5 mmol/L (ref 3.5–5.1)
Sodium: 141 mmol/L (ref 135–145)
Total Bilirubin: 0.8 mg/dL (ref 0.0–1.2)
Total Protein: 6.8 g/dL (ref 6.5–8.1)

## 2024-04-01 LAB — CBC WITH DIFFERENTIAL/PLATELET
Abs Immature Granulocytes: 0.02 10*3/uL (ref 0.00–0.07)
Basophils Absolute: 0 10*3/uL (ref 0.0–0.1)
Basophils Relative: 0 %
Eosinophils Absolute: 0 10*3/uL (ref 0.0–0.5)
Eosinophils Relative: 0 %
HCT: 51.8 % (ref 39.0–52.0)
Hemoglobin: 17.8 g/dL — ABNORMAL HIGH (ref 13.0–17.0)
Immature Granulocytes: 0 %
Lymphocytes Relative: 9 %
Lymphs Abs: 0.6 10*3/uL — ABNORMAL LOW (ref 0.7–4.0)
MCH: 33.3 pg (ref 26.0–34.0)
MCHC: 34.4 g/dL (ref 30.0–36.0)
MCV: 97 fL (ref 80.0–100.0)
Monocytes Absolute: 0.8 10*3/uL (ref 0.1–1.0)
Monocytes Relative: 12 %
Neutro Abs: 5.4 10*3/uL (ref 1.7–7.7)
Neutrophils Relative %: 79 %
Platelets: 253 10*3/uL (ref 150–400)
RBC: 5.34 MIL/uL (ref 4.22–5.81)
RDW: 14.2 % (ref 11.5–15.5)
WBC: 6.8 10*3/uL (ref 4.0–10.5)
nRBC: 0 % (ref 0.0–0.2)

## 2024-04-01 LAB — ETHANOL: Alcohol, Ethyl (B): 135 mg/dL — ABNORMAL HIGH (ref <10)

## 2024-04-01 LAB — TROPONIN I (HIGH SENSITIVITY)
Troponin I (High Sensitivity): 16 ng/L (ref <18)
Troponin I (High Sensitivity): 17 ng/L (ref <18)

## 2024-04-01 LAB — I-STAT VENOUS BLOOD GAS, ED
Acid-base deficit: 4 mmol/L — ABNORMAL HIGH (ref 0.0–2.0)
Bicarbonate: 21 mmol/L (ref 20.0–28.0)
Calcium, Ion: 1.21 mmol/L (ref 1.15–1.40)
HCT: 55 % — ABNORMAL HIGH (ref 39.0–52.0)
Hemoglobin: 18.7 g/dL — ABNORMAL HIGH (ref 13.0–17.0)
O2 Saturation: 82 %
Potassium: 4.1 mmol/L (ref 3.5–5.1)
Sodium: 140 mmol/L (ref 135–145)
TCO2: 22 mmol/L (ref 22–32)
pCO2, Ven: 36.3 mmHg — ABNORMAL LOW (ref 44–60)
pH, Ven: 7.37 (ref 7.25–7.43)
pO2, Ven: 47 mmHg — ABNORMAL HIGH (ref 32–45)

## 2024-04-01 LAB — URINALYSIS, ROUTINE W REFLEX MICROSCOPIC
Bilirubin Urine: NEGATIVE
Glucose, UA: NEGATIVE mg/dL
Hgb urine dipstick: NEGATIVE
Ketones, ur: NEGATIVE mg/dL
Leukocytes,Ua: NEGATIVE
Nitrite: NEGATIVE
Protein, ur: NEGATIVE mg/dL
Specific Gravity, Urine: 1.005 (ref 1.005–1.030)
pH: 5 (ref 5.0–8.0)

## 2024-04-01 LAB — CBG MONITORING, ED: Glucose-Capillary: 84 mg/dL (ref 70–99)

## 2024-04-01 LAB — RAPID URINE DRUG SCREEN, HOSP PERFORMED
Amphetamines: NOT DETECTED
Barbiturates: NOT DETECTED
Benzodiazepines: NOT DETECTED
Cocaine: POSITIVE — AB
Opiates: NOT DETECTED
Tetrahydrocannabinol: NOT DETECTED

## 2024-04-01 NOTE — ED Notes (Signed)
 Pt called out stating he needs to urinate.  Pt provided a urinal and told not to get up.  Pt's O2 sat noted to be fluctuating between 79-83% on 4L [place].

## 2024-04-01 NOTE — ED Provider Notes (Signed)
 [place] EMERGENCY DEPARTMENT AT [facility] Provider Note   CSN: [id number] Arrival date & time: 04/01/24  1624     History  No chief complaint on file.   Richard [name] is a 53 y.o. male.  Patient complains of shortness of breath.  EMS reports that they were called to patient's home.  Patient is supposed to be on continuous oxygen  but patient removed oxygen  tonight and has been drinking alcohol.  Patient's family told EMS that patient becomes aggressive when he drinks.  EMS reports patient was agitated and passed out.  They report that they placed a nonrebreather on patient and patient began trying to hit them.  Paramedic reports that they have had multiple calls to patient's home and had to place him back on oxygen  when he has removed it.  Patient removed his oxygen  on arrival here and tried to leave the room.  Patient had a syncopal episode.  Staff was able to hold onto the patient and get him back onto the stretcher without him harming himself.  Patient complains of being hungry.  Patient denies any other complaints he states he wants to go home  The history is provided by the patient. No language interpreter was used.       Home Medications Prior to Admission medications   Medication Sig Start Date End Date Taking? Authorizing Provider  albuterol  (VENTOLIN  HFA) 108 (90 Base) MCG/ACT inhaler Inhale 2 puffs into the lungs every 6 (six) hours as needed for wheezing or shortness of breath. 09/24/23   Hunsucker, Archer Kobs, MD  amLODipine  (NORVASC ) 10 MG tablet Take 10 mg by mouth every morning. 08/06/23   [provider]  cloNIDine  (CATAPRES ) 0.1 MG tablet Take 0.1 mg by mouth. Take 2 tablets by mouth at 9am and 5pm and 1 tablet at bedtime    [provider]  Fluticasone -Umeclidin-Vilant (TRELEGY ELLIPTA ) 200-62.5-25 MCG/ACT AEPB Inhale 1 puff into the lungs daily. 09/24/23   Hunsucker, Archer Kobs, MD  hydrochlorothiazide  (HYDRODIURIL ) 12.5 MG tablet Take 12.5  mg by mouth every morning. 08/06/23   [provider]  levETIRAcetam  (KEPPRA ) 500 MG tablet Take 1 tablet (500 mg total) by mouth 2 (two) times daily. 01/17/23   Adria Hopkins, MD  losartan  (COZAAR ) 100 MG tablet Take 100 mg by mouth daily. 12/31/22   [provider]  sertraline  (ZOLOFT ) 25 MG tablet Take 25 mg by mouth daily. 07/23/23   [provider]  SPIRIVA  RESPIMAT 2.5 MCG/ACT AERS Inhale 2.5 mcg into the lungs daily. 09/24/23 10/24/23  Hunsucker, Archer Kobs, MD      Allergies    Dilaudid  [hydromorphone  hcl] and Tape    Review of Systems   Review of Systems  All other systems reviewed and are negative.   Physical Exam Updated Vital Signs BP (!) 164/117 (BP Location: Right Arm)   Pulse 92   Temp 98.7 F (37.1 C) (Oral)   Resp 17   SpO2 91%  Physical Exam Vitals and nursing note reviewed.  Constitutional:      Appearance: He is well-developed.  HENT:     Head: Normocephalic.     Nose: Nose normal.     Mouth/Throat:     Mouth: Mucous membranes are moist.  Eyes:     Extraocular Movements: Extraocular movements intact.     Pupils: Pupils are equal, round, and reactive to light.  Cardiovascular:     Rate and Rhythm: Normal rate and regular rhythm.     Pulses: Normal pulses.  Pulmonary:     Effort: Pulmonary effort is normal.  Abdominal:     General: There is no distension.  Musculoskeletal:        General: Normal range of motion.     Cervical back: Normal range of motion.  Skin:    General: Skin is warm.  Neurological:     General: No focal deficit present.     Mental Status: He is alert and oriented to person, place, and time.  Psychiatric:        Mood and Affect: Mood normal.     ED Results / Procedures / Treatments   Labs (all labs ordered are listed, but only abnormal results are displayed) Labs Reviewed  CBC WITH DIFFERENTIAL/PLATELET - Abnormal; Notable for the following components:      Result Value   Hemoglobin 17.8 (*)     Lymphs Abs 0.6 (*)    All other components within normal limits  URINALYSIS, ROUTINE W REFLEX MICROSCOPIC - Abnormal; Notable for the following components:   Color, Urine STRAW (*)    All other components within normal limits  RAPID URINE DRUG SCREEN, HOSP PERFORMED - Abnormal; Notable for the following components:   Cocaine POSITIVE (*)    All other components within normal limits  COMPREHENSIVE METABOLIC PANEL WITH GFR - Abnormal; Notable for the following components:   Calcium 8.6 (*)    Albumin 3.3 (*)    All other components within normal limits  ETHANOL - Abnormal; Notable for the following components:   Alcohol, Ethyl (B) 135 (*)    All other components within normal limits  I-STAT VENOUS BLOOD GAS, ED - Abnormal; Notable for the following components:   pCO2, Ven 36.3 (*)    pO2, Ven 47 (*)    Acid-base deficit 4.0 (*)    HCT 55.0 (*)    Hemoglobin 18.7 (*)    All other components within normal limits  CBG MONITORING, ED  TROPONIN I (HIGH SENSITIVITY)  TROPONIN I (HIGH SENSITIVITY)    EKG None  Radiology DG Chest Port 1 View Result Date: 04/01/2024 CLINICAL DATA:  Shortness of breath EXAM: PORTABLE CHEST 1 VIEW COMPARISON:  11/05/2023 FINDINGS: Heart size is normal. The left chest is clear. Chronic right hilar and perihilar density appears the same as it did in November of last year. No sign of superimposed pneumonia or collapse. No pleural effusion. No abnormal bone finding. IMPRESSION: Chronic right hilar and perihilar density, similar to November of last year. No sign of superimposed pneumonia or collapse. Electronically Signed   By: Bettylou Brunner M.D.   On: 04/01/2024 18:30   CT Head Wo Contrast Result Date: 04/01/2024 CLINICAL DATA:  Mental status change of unknown cause EXAM: CT HEAD WITHOUT CONTRAST TECHNIQUE: Contiguous axial images were obtained from the base of the skull through the vertex without intravenous contrast. RADIATION DOSE REDUCTION: This exam was  performed according to the departmental dose-optimization program which includes automated exposure control, adjustment of the mA and/or kV according to patient size and/or use of iterative reconstruction technique. COMPARISON:  01/16/2023 FINDINGS: Brain: No focal abnormality seen affecting the brainstem or cerebellum. Cerebral hemispheres show moderate chronic small-vessel ischemic change. There is chronic encephalomalacia of the left frontal lobe that could be due to prior trauma or ischemic infarction. Temporal tip appears normal. No evidence of mass, hemorrhage, hydrocephalus or extra-axial collection. Vascular: No abnormal vascular finding. Skull: Negative Sinuses/Orbits: Chronic inflammatory changes of the left maxillary sinus with pronounced mucoperiosteal change. Orbits negative. Other:  None IMPRESSION: 1. No acute CT finding. Moderate chronic small-vessel ischemic change of the cerebral hemispheric white matter. Chronic encephalomalacia of the left frontal lobe that could be due to prior trauma or ischemic infarction. 2. Chronic inflammatory changes of the left maxillary sinus. Electronically Signed   By: Bettylou Brunner M.D.   On: 04/01/2024 18:23    Procedures Procedures    Medications Ordered in ED Medications - No data to display  ED Course/ Medical Decision Making/ A&P                                 Medical Decision Making Pt had a syncopal episode at home.  EMS was called by Family because pt was drinking alcohol and not wearing his oxygen    Amount and/or Complexity of Data Reviewed Independent Historian: EMS    Details: Ems reports pt was violent with them  Labs: ordered. Decision-making details documented in ED Course.    Details: Labs ordered reviewed and interpreted  Etoh 135.  Urine drug screen positive for cocaine Radiology: ordered and independent interpretation performed. Decision-making details documented in ED Course.    Details: Chest xray  no acute findings    Risk Risk Details: Patient observed in the ED.  Patient has been able to eat and drink.  Patient reports that he can ambulate without oxygen .  Patient was able to walk up and down the hallway without his oxygen  however his sats did decrease to the low 80s.  I have tried to encourage patient to get a ride home.  Patient is insistent that he is going home by cab.  He seems awake and aware of what is going on.  I do not believe that he is currently too intoxicated to make this decision.  He has been observed in the emergency department for the past 6 hours. Pt agrees to go home by PTar            Final Clinical Impression(s) / ED Diagnoses Final diagnoses:  Hypoxia  Alcoholic intoxication without complication [facility])    Rx / DC Orders ED Discharge Orders     None         Lalo Tromp K, PA-C 04/01/24 2345    Lowery Rue, DO 04/02/24 1739

## 2024-04-01 NOTE — ED Triage Notes (Signed)
 Pt bib ems from home c/o sob, cp, and unresponsive. EMS state pt was hard to arouse until NPA was going to be placed. Pt was in the 70's RA.  Pt is suppose to wear 4L but not compliant.  Pt drank a few beers and one shot today. Friends state when he isn't wearing his O2  he can become boisterous and aggressive.    CBG 99 BP 150/98

## 2024-04-01 NOTE — ED Notes (Signed)
 A&O X4 on 4L [place].

## 2024-04-01 NOTE — ED Notes (Signed)
 Informed RN that Patient called to report anxiousness increase.

## 2024-04-01 NOTE — ED Notes (Addendum)
 Pt experienced syncopal episode, O2 75% on room air.

## 2024-04-01 NOTE — ED Notes (Signed)
 Per [place] EMT, multiple attempts have been made to reach spouse and sister per emergency contacts. Numbers are not working numbers. Alphonso Aschoff spoke with aunt who has also been unable to reach pt's spouse. Pt does not have cellphone with them. Alphonso Aschoff states they then called pt's personal cellphone at home and no one answered. Pt confirms that spouse is at residence.

## 2024-04-01 NOTE — Discharge Instructions (Addendum)
Wear your oxygen as directed.

## 2024-04-01 NOTE — ED Notes (Signed)
 Pt at bedside stating he needs to get some food and leave. Pt refusing to place O2 on and stating chest is hurting. Paramedic, RN, and NT attempting to redirect pt but refusing.

## 2024-04-03 ENCOUNTER — Other Ambulatory Visit: Payer: 59

## 2024-04-05 ENCOUNTER — Ambulatory Visit (HOSPITAL_COMMUNITY): Attending: Internal Medicine

## 2024-04-05 ENCOUNTER — Encounter

## 2024-04-05 ENCOUNTER — Inpatient Hospital Stay: Attending: Internal Medicine

## 2024-04-11 ENCOUNTER — Inpatient Hospital Stay: Payer: 59 | Admitting: Internal Medicine

## 2024-04-11 ENCOUNTER — Other Ambulatory Visit: Payer: 59

## 2024-04-13 ENCOUNTER — Telehealth: Payer: Self-pay | Admitting: Internal Medicine

## 2024-04-13 NOTE — Telephone Encounter (Signed)
 Scheduled appointments per the patients request. The patient stated he was going to call radiology "now" and schedule the CT scan. He will be mailed appointment reminders.

## 2024-04-19 ENCOUNTER — Telehealth: Payer: Self-pay | Admitting: Internal Medicine

## 2024-04-19 NOTE — Telephone Encounter (Signed)
 Left a voicemail with appointment changes.

## 2024-04-20 ENCOUNTER — Telehealth: Payer: Self-pay | Admitting: Physician Assistant

## 2024-04-20 NOTE — Telephone Encounter (Signed)
 Rescheduled the patients appointment per the patients request.

## 2024-04-27 ENCOUNTER — Emergency Department (HOSPITAL_COMMUNITY)
Admission: EM | Admit: 2024-04-27 | Discharge: 2024-04-27 | Disposition: A | Discharge status: Home / Self Care | Source: Home / Self Care

## 2024-05-12 ENCOUNTER — Ambulatory Visit: Admitting: Podiatry

## 2024-05-15 ENCOUNTER — Ambulatory Visit: Admitting: Internal Medicine

## 2024-05-15 ENCOUNTER — Other Ambulatory Visit

## 2024-05-15 ENCOUNTER — Ambulatory Visit: Admitting: Podiatry

## 2024-05-18 ENCOUNTER — Encounter (HOSPITAL_COMMUNITY): Payer: Self-pay

## 2024-05-18 ENCOUNTER — Inpatient Hospital Stay (HOSPITAL_COMMUNITY)
Admission: EM | Admit: 2024-05-18 | Discharge: 2024-05-22 | DRG: 193 | Disposition: A | Discharge status: Home / Self Care | Attending: Internal Medicine | Admitting: Internal Medicine

## 2024-05-18 ENCOUNTER — Emergency Department (HOSPITAL_COMMUNITY)

## 2024-05-18 ENCOUNTER — Inpatient Hospital Stay: Admitting: Internal Medicine

## 2024-05-18 ENCOUNTER — Emergency Department (HOSPITAL_COMMUNITY)
Admission: EM | Admit: 2024-05-18 | Discharge: 2024-05-18 | Disposition: A | Discharge status: Home / Self Care | Source: Home / Self Care | Attending: Emergency Medicine | Admitting: Emergency Medicine

## 2024-05-18 ENCOUNTER — Other Ambulatory Visit: Payer: Self-pay

## 2024-05-18 ENCOUNTER — Inpatient Hospital Stay: Attending: Internal Medicine

## 2024-05-18 DIAGNOSIS — I2489 Other forms of acute ischemic heart disease: Secondary | ICD-10-CM | POA: Diagnosis present

## 2024-05-18 DIAGNOSIS — F17210 Nicotine dependence, cigarettes, uncomplicated: Secondary | ICD-10-CM | POA: Insufficient documentation

## 2024-05-18 DIAGNOSIS — G40909 Epilepsy, unspecified, not intractable, without status epilepticus: Secondary | ICD-10-CM | POA: Diagnosis present

## 2024-05-18 DIAGNOSIS — F1490 Cocaine use, unspecified, uncomplicated: Secondary | ICD-10-CM | POA: Insufficient documentation

## 2024-05-18 DIAGNOSIS — J9621 Acute and chronic respiratory failure with hypoxia: Principal | ICD-10-CM | POA: Diagnosis present

## 2024-05-18 DIAGNOSIS — F39 Unspecified mood [affective] disorder: Secondary | ICD-10-CM | POA: Diagnosis present

## 2024-05-18 DIAGNOSIS — Z7982 Long term (current) use of aspirin: Secondary | ICD-10-CM | POA: Insufficient documentation

## 2024-05-18 DIAGNOSIS — J189 Pneumonia, unspecified organism: Secondary | ICD-10-CM | POA: Diagnosis not present

## 2024-05-18 DIAGNOSIS — Z716 Tobacco abuse counseling: Secondary | ICD-10-CM

## 2024-05-18 DIAGNOSIS — Z1152 Encounter for screening for COVID-19: Secondary | ICD-10-CM

## 2024-05-18 DIAGNOSIS — R0600 Dyspnea, unspecified: Secondary | ICD-10-CM

## 2024-05-18 DIAGNOSIS — D751 Secondary polycythemia: Secondary | ICD-10-CM | POA: Diagnosis present

## 2024-05-18 DIAGNOSIS — I5032 Chronic diastolic (congestive) heart failure: Secondary | ICD-10-CM

## 2024-05-18 DIAGNOSIS — Z9981 Dependence on supplemental oxygen: Secondary | ICD-10-CM

## 2024-05-18 DIAGNOSIS — R7989 Other specified abnormal findings of blood chemistry: Secondary | ICD-10-CM | POA: Insufficient documentation

## 2024-05-18 DIAGNOSIS — Z885 Allergy status to narcotic agent status: Secondary | ICD-10-CM

## 2024-05-18 DIAGNOSIS — J440 Chronic obstructive pulmonary disease with acute lower respiratory infection: Secondary | ICD-10-CM | POA: Diagnosis present

## 2024-05-18 DIAGNOSIS — Z9221 Personal history of antineoplastic chemotherapy: Secondary | ICD-10-CM

## 2024-05-18 DIAGNOSIS — Z86718 Personal history of other venous thrombosis and embolism: Secondary | ICD-10-CM

## 2024-05-18 DIAGNOSIS — Z8679 Personal history of other diseases of the circulatory system: Secondary | ICD-10-CM

## 2024-05-18 DIAGNOSIS — Z833 Family history of diabetes mellitus: Secondary | ICD-10-CM

## 2024-05-18 DIAGNOSIS — Z8673 Personal history of transient ischemic attack (TIA), and cerebral infarction without residual deficits: Secondary | ICD-10-CM

## 2024-05-18 DIAGNOSIS — C3490 Malignant neoplasm of unspecified part of unspecified bronchus or lung: Secondary | ICD-10-CM | POA: Diagnosis present

## 2024-05-18 DIAGNOSIS — Z87820 Personal history of traumatic brain injury: Secondary | ICD-10-CM

## 2024-05-18 DIAGNOSIS — Z923 Personal history of irradiation: Secondary | ICD-10-CM

## 2024-05-18 DIAGNOSIS — Z79899 Other long term (current) drug therapy: Secondary | ICD-10-CM

## 2024-05-18 DIAGNOSIS — E66811 Obesity, class 1: Secondary | ICD-10-CM | POA: Diagnosis present

## 2024-05-18 DIAGNOSIS — Z7951 Long term (current) use of inhaled steroids: Secondary | ICD-10-CM

## 2024-05-18 DIAGNOSIS — Z6833 Body mass index (BMI) 33.0-33.9, adult: Secondary | ICD-10-CM

## 2024-05-18 DIAGNOSIS — Z91048 Other nonmedicinal substance allergy status: Secondary | ICD-10-CM

## 2024-05-18 DIAGNOSIS — I110 Hypertensive heart disease with heart failure: Secondary | ICD-10-CM | POA: Diagnosis present

## 2024-05-18 DIAGNOSIS — I5A Non-ischemic myocardial injury (non-traumatic): Secondary | ICD-10-CM | POA: Diagnosis present

## 2024-05-18 LAB — COMPREHENSIVE METABOLIC PANEL WITH GFR
ALT: 25 U/L (ref 0–44)
AST: 30 U/L (ref 15–41)
Albumin: 3.6 g/dL (ref 3.5–5.0)
Alkaline Phosphatase: 68 U/L (ref 38–126)
Anion gap: 16 — ABNORMAL HIGH (ref 5–15)
BUN: 10 mg/dL (ref 6–20)
CO2: 16 mmol/L — ABNORMAL LOW (ref 22–32)
Calcium: 8.8 mg/dL — ABNORMAL LOW (ref 8.9–10.3)
Chloride: 107 mmol/L (ref 98–111)
Creatinine, Ser: 0.91 mg/dL (ref 0.61–1.24)
GFR, Estimated: >60 mL/min (ref >60)
Glucose, Bld: 90 mg/dL (ref 70–99)
Potassium: 4 mmol/L (ref 3.5–5.1)
Sodium: 139 mmol/L (ref 135–145)
Total Bilirubin: 0.9 mg/dL (ref 0.0–1.2)
Total Protein: 6.9 g/dL (ref 6.5–8.1)

## 2024-05-18 LAB — TROPONIN I (HIGH SENSITIVITY)
Troponin I (High Sensitivity): 18 ng/L — ABNORMAL HIGH (ref <18)
Troponin I (High Sensitivity): 23 ng/L — ABNORMAL HIGH (ref <18)

## 2024-05-18 LAB — CBC WITH DIFFERENTIAL/PLATELET
Abs Immature Granulocytes: 0.02 10*3/uL (ref 0.00–0.07)
Basophils Absolute: 0 10*3/uL (ref 0.0–0.1)
Basophils Relative: 0 %
Eosinophils Absolute: 0 10*3/uL (ref 0.0–0.5)
Eosinophils Relative: 0 %
HCT: 52.5 % — ABNORMAL HIGH (ref 39.0–52.0)
Hemoglobin: 18.2 g/dL — ABNORMAL HIGH (ref 13.0–17.0)
Immature Granulocytes: 0 %
Lymphocytes Relative: 5 %
Lymphs Abs: 0.4 10*3/uL — ABNORMAL LOW (ref 0.7–4.0)
MCH: 33.9 pg (ref 26.0–34.0)
MCHC: 34.7 g/dL (ref 30.0–36.0)
MCV: 97.8 fL (ref 80.0–100.0)
Monocytes Absolute: 0.6 10*3/uL (ref 0.1–1.0)
Monocytes Relative: 8 %
Neutro Abs: 6.4 10*3/uL (ref 1.7–7.7)
Neutrophils Relative %: 87 %
Platelets: 246 10*3/uL (ref 150–400)
RBC: 5.37 MIL/uL (ref 4.22–5.81)
RDW: 16.4 % — ABNORMAL HIGH (ref 11.5–15.5)
WBC: 7.5 10*3/uL (ref 4.0–10.5)
nRBC: 0 % (ref 0.0–0.2)

## 2024-05-18 LAB — CBC
HCT: 55.9 % — ABNORMAL HIGH (ref 39.0–52.0)
Hemoglobin: 18.2 g/dL — ABNORMAL HIGH (ref 13.0–17.0)
MCH: 31.8 pg (ref 26.0–34.0)
MCHC: 32.6 g/dL (ref 30.0–36.0)
MCV: 97.6 fL (ref 80.0–100.0)
Platelets: 268 10*3/uL (ref 150–400)
RBC: 5.73 MIL/uL (ref 4.22–5.81)
RDW: 15.3 % (ref 11.5–15.5)
WBC: 6.2 10*3/uL (ref 4.0–10.5)
nRBC: 0 % (ref 0.0–0.2)

## 2024-05-18 LAB — BRAIN NATRIURETIC PEPTIDE: B Natriuretic Peptide: 258.7 pg/mL — ABNORMAL HIGH (ref 0.0–100.0)

## 2024-05-18 LAB — D-DIMER, QUANTITATIVE: D-Dimer, Quant: 0.9 ug{FEU}/mL — ABNORMAL HIGH (ref 0.00–0.50)

## 2024-05-18 MED ORDER — IOHEXOL 350 MG/ML SOLN
75.0000 mL | Freq: Once | INTRAVENOUS | Status: AC | PRN
  Administered 2024-05-18: 75 mL via INTRAVENOUS

## 2024-05-18 MED ORDER — SODIUM CHLORIDE 0.9 % IV SOLN
1.0000 g | Freq: Once | INTRAVENOUS | Status: AC
  Administered 2024-05-19: 1 g via INTRAVENOUS
  Filled 2024-05-18: qty 10

## 2024-05-18 MED ORDER — SODIUM CHLORIDE 0.9 % IV SOLN
500.0000 mg | Freq: Once | INTRAVENOUS | Status: AC
  Administered 2024-05-19: 500 mg via INTRAVENOUS
  Filled 2024-05-18: qty 5

## 2024-05-18 MED ORDER — DOXYCYCLINE HYCLATE 100 MG PO CAPS: 100.0000 mg | ORAL_CAPSULE | Freq: Two times a day (BID) | ORAL | 0 refills | Status: DC

## 2024-05-18 MED ORDER — IPRATROPIUM-ALBUTEROL 0.5-2.5 (3) MG/3ML IN SOLN
3.0000 mL | Freq: Once | RESPIRATORY_TRACT | Status: AC
  Administered 2024-05-18: 3 mL via RESPIRATORY_TRACT
  Filled 2024-05-18: qty 3

## 2024-05-18 NOTE — ED Triage Notes (Signed)
 Pt BIB [facility] from home for CP and SHOB. Pt just discharged this AM for same. Pt on 4L O2 at baseline but didn't feel like anything was coming out of his oxygen  canister. H/x COPD, CHF, lung CA.  324mg  ASA 0.4mg  NTG 18g LAC  139/90

## 2024-05-18 NOTE — ED Notes (Signed)
 Patient transported to CT

## 2024-05-18 NOTE — ED Triage Notes (Signed)
 Pt BIB [facility] from home. Pt was out walking and was smoking a cigarette. Pt started to have increasing SHOB. Pt wears 5L [place] at baseline, pt was not wearing his oxygen  on his walk. Pt reports having chest pain, leg swelling, and SHOB for a year.   EMS 92% 5L [place] 162/100 BP 110P 22 R

## 2024-05-18 NOTE — ED Provider Triage Note (Signed)
 Emergency Medicine Provider Triage Evaluation Note  Bohden Dung , a 53 y.o. male  was evaluated in triage.  Pt complains of SOB. Seen yesterday for the same.  Worsened today.  Review of Systems  Positive: SOB Negative:   Physical Exam  BP (!) 137/104 (BP Location: Right Arm)   Pulse 95   Temp 98.6 F (37 C) (Oral)   Resp 17   Ht 5\' 9"  (1.753 m)   Wt 102 kg   SpO2 100%   BMI 33.21 kg/m  Gen:   Awake, no distress   Resp:  Normal effort  MSK:   Moves extremities without difficulty  Other:    Medical Decision Making  Medically screening exam initiated at 12:00 AM.  Appropriate orders placed.  Caidence Turgeon was informed that the remainder of the evaluation will be completed by another provider, this initial triage assessment does not replace that evaluation, and the importance of remaining in the ED until their evaluation is complete.     Sherel Dikes, PA-C 05/19/24 0000

## 2024-05-18 NOTE — ED Provider Notes (Incomplete)
 [place] EMERGENCY DEPARTMENT AT [facility] Provider Note   CSN: [id number] Arrival date & time: 05/18/24  2258     History {Add pertinent medical, surgical, social history, OB history to HPI:1} Chief Complaint  Patient presents with   Chest Pain   Shortness of Breath    Richard [name] is a 53 y.o. male.  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  Seen yesterday for same.  Is increasing shortness of breath and chest pain with coughing.  Cough is productive of clear and yellow mucus.  Was prescribed doxycycline  yesterday for possible pneumonia which she has not yet started.  Feels short of breath all the time but worse with exertion.  Has not had any increase in his home oxygen  of 4 to 5 L.  Chest pain is right-sided and last for a few seconds with coughing only.  Has noticed some swelling in his legs as well.  No fever.  No abdominal pain, vomiting, diarrhea.  No headache.  Feels like he is breathing normally now but still having shortness of breath with exertion and chest pain with coughing.  Did have a CT scan is negative for pulmonary embolism.  The history is provided by the patient and the EMS personnel.  Chest Pain Associated symptoms: shortness of breath and weakness   Associated symptoms: no abdominal pain, no fever, no headache, no nausea and no vomiting   Shortness of Breath Associated symptoms: chest pain   Associated symptoms: no abdominal pain, no fever, no headaches, no rash and no vomiting        Home Medications Prior to Admission medications   Medication Sig Start Date End Date Taking? Authorizing Provider  albuterol  (PROVENTIL ) (2.5 MG/3ML) 0.083% nebulizer solution Take 2.5 mg by nebulization 3 (three) times daily as needed for shortness of breath or wheezing. 05/11/24   [provider]  albuterol  (VENTOLIN  HFA) 108 (90 Base) MCG/ACT inhaler  Inhale 2 puffs into the lungs every 6 (six) hours as needed for wheezing or shortness of breath. 09/24/23   Hunsucker, Archer Kobs, MD  amLODipine  (NORVASC ) 10 MG tablet Take 10 mg by mouth every morning. 08/06/23   [provider]  Aspirin -Acetaminophen  (GOODYS BODY PAIN PO) Take 1 packet by mouth daily as needed (for pain).    [provider]  cloNIDine  (CATAPRES ) 0.1 MG tablet Take 0.1 mg by mouth. Take 2 tablets by mouth at 9am and 5pm and 1 tablet at bedtime    [provider]  doxycycline  (VIBRAMYCIN ) 100 MG capsule Take 1 capsule (100 mg total) by mouth 2 (two) times daily. One po bid x 7 days 05/18/24   Mesner, Reymundo Caulk, MD  Fluticasone -Umeclidin-Vilant (TRELEGY ELLIPTA ) 200-62.5-25 MCG/ACT AEPB Inhale 1 puff into the lungs daily. 09/24/23   Hunsucker, Archer Kobs, MD  hydrochlorothiazide  (HYDRODIURIL ) 12.5 MG tablet Take 12.5 mg by mouth every morning. 08/06/23   [provider]  levETIRAcetam  (KEPPRA ) 500 MG tablet Take 1 tablet (500 mg total) by mouth 2 (two) times daily. 01/17/23   Adria Hopkins, MD  losartan  (COZAAR ) 100 MG tablet Take 100 mg by mouth daily. 12/31/22   [provider]  sertraline  (ZOLOFT ) 25 MG tablet Take 25 mg by mouth daily. 07/23/23   [provider]      Allergies    Dilaudid  [hydromorphone  hcl] and Tape    Review of Systems   Review of Systems  Constitutional:  Negative for activity change, appetite change and fever.  HENT:  Negative for congestion and sinus pain.   Respiratory:  Positive for chest tightness and shortness of breath.   Cardiovascular:  Positive for chest pain.  Gastrointestinal:  Negative for abdominal pain, diarrhea, nausea and vomiting.  Genitourinary:  Negative for dysuria and hematuria.  Musculoskeletal:  Negative for arthralgias and myalgias.  Skin:  Negative for rash.  Neurological:  Positive for weakness. Negative for headaches.   all other systems are negative except as noted in the HPI and  PMH.    Physical Exam Updated Vital Signs BP (!) 137/104 (BP Location: Right Arm)   Pulse 95   Temp 98.6 F (37 C) (Oral)   Resp 17   Ht 5\' 9"  (1.753 m)   Wt 102 kg   SpO2 100%   BMI 33.21 kg/m  Physical Exam Vitals and nursing note reviewed.  Constitutional:      General: He is not in acute distress.    Appearance: He is well-developed. He is ill-appearing.     Comments: Dyspneic with conversation  HENT:     Head: Normocephalic and atraumatic.     Mouth/Throat:     Pharynx: No oropharyngeal exudate.  Eyes:     Conjunctiva/sclera: Conjunctivae normal.     Pupils: Pupils are equal, round, and reactive to light.  Neck:     Comments: No meningismus. Cardiovascular:     Rate and Rhythm: Normal rate and regular rhythm.     Heart sounds: Normal heart sounds. No murmur heard. Pulmonary:     Effort: Pulmonary effort is normal. No respiratory distress.     Breath sounds: Normal breath sounds.     Comments: Diminished without wheeze Abdominal:     Palpations: Abdomen is soft.     Tenderness: There is no abdominal tenderness. There is no guarding or rebound.  Musculoskeletal:        General: No tenderness. Normal range of motion.     Cervical back: Normal range of motion and neck supple.     Right lower leg: No edema.     Left lower leg: No edema.  Skin:    General: Skin is warm.  Neurological:     Mental Status: He is alert and oriented to person, place, and time.     Cranial Nerves: No cranial nerve deficit.     Motor: No abnormal muscle tone.     Coordination: Coordination normal.     Comments:  5/5 strength throughout. CN 2-12 intact.Equal grip strength.   Psychiatric:        Behavior: Behavior normal.     ED Results / Procedures / Treatments   Labs (all labs ordered are listed, but only abnormal results are displayed) Labs Reviewed  CULTURE, BLOOD (ROUTINE X 2)  CULTURE, BLOOD (ROUTINE X 2)  BASIC METABOLIC PANEL WITH GFR  CBC  LACTIC ACID, PLASMA  LACTIC  ACID, PLASMA  TROPONIN I (HIGH SENSITIVITY)    EKG EKG Interpretation Date/Time:  Thursday May 18 2024 23:19:29 EDT Ventricular Rate:  98 PR Interval:  180 QRS Duration:  86 QT Interval:  360 QTC Calculation: 459 R Axis:   77  Text Interpretation: Sinus rhythm with Premature atrial complexes Right atrial enlargement Borderline ECG No significant change since last tracing Confirmed by Eldon Greenland ([id number]) on 05/18/2024 11:35:03 PM  Radiology DG Chest 2 View Result Date: 05/18/2024 CLINICAL DATA:  Chest pain EXAM: CHEST - 2 VIEW COMPARISON:  Chest x-ray  05/18/2024.  CT of the chest 05/18/2024. FINDINGS: The right hilum appears enlarged and there is right perihilar airspace opacities similar to the prior study. Scarring in the right upper lobe is again noted. Scattered interstitial opacities in the left lower lung are unchanged. There is a small right pleural effusion. There is no pneumothorax. The cardiac silhouette is within normal limits. No acute fractures are seen. IMPRESSION: 1. Stable enlarged right hilum with right perihilar airspace opacities. 2. Small right pleural effusion. Electronically Signed   By: Tyron Gallon M.D.   On: 05/18/2024 23:22   CT Angio Chest PE W and/or Wo Contrast Result Date: 05/18/2024 EXAM: CTA of the Chest with contrast for PE 05/18/2024 04:17:28 AM TECHNIQUE: CTA of the chest was performed after the administration of intravenous contrast, with and without IV contrast. Multiplanar reformatted images are provided for review. MIP images are provided for review. Automated exposure control, iterative reconstruction, and/or weight based adjustment of the mA/kV was utilized to reduce the radiation dose to as low as reasonably achievable. 75mL of ioheixol (OMNIPAQUE ) 350 MG/ML injection was used. COMPARISON: Comparison is made to 1 view chest x-ray on 05/18/2024 and CT chest with contrast on 12/17/2023. CLINICAL HISTORY: Pulmonary embolism (PE) suspected, low to  intermediate probability, positive D-dimer. The patient has a personal history of non-small cell lung cancer. The patient was out walking and was smoking a cigarette when he started to have increasing shortness of breath [facility]). The patient wears 5L nasal cannula ([place]) at baseline but was not wearing his oxygen  on his walk. The patient reports having chest pain, leg swelling, and SHOB for a year. FINDINGS: PULMONARY ARTERIES: Pulmonary arteries are adequately opacified for evaluation. No pulmonary embolism. Main pulmonary artery is normal in caliber. MEDIASTINUM: The heart and pericardium demonstrate no acute abnormality. There is no acute abnormality of the thoracic aorta. LYMPH NODES: No mediastinal, hilar or axillary lymphadenopathy. LUNGS AND PLEURA: The lungs show centrilobular emphysema. Post treatment changes in the right upper lobe and right hilum are stable. Diffuse peribronchial micronodularity is now present. A new pleural-based nodule is present in the posteromedial left lower lobe that measures 13 x 11 mm. No pleural effusion or pneumothorax. UPPER ABDOMEN: A left adrenal nodule is stable. SOFT TISSUES AND BONES: No acute bone or soft tissue abnormality. IMPRESSION: 1. No pulmonary embolism. 2. Diffuse peribronchial micronodular pattern reflects an acute inflammatory or infectious process. 3. New 13 x 11 mm pleural-based nodule in the posteromedial left lower lobe. This may represent rounded atelectasis. 4. Stable post treatment changes in the right upper lobe and right hilum. Electronically signed by: Audree Leas MD 05/18/2024 04:35 AM EDT RP Workstation: [id number]   DG Chest Portable 1 View Result Date: 05/18/2024 CLINICAL DATA:  Dyspnea EXAM: PORTABLE CHEST 1 VIEW COMPARISON:  None Available. FINDINGS: Masslike consolidation within the right hilum reflects post radiation changes better appreciated on CT examination of 12/17/2023. Parenchymal scarring within the right mid lung zone. Minimal  left basilar atelectasis or infiltrate. Lungs are otherwise clear. No pneumothorax or pleural effusion. Cardiac size is at the upper limits of normal. Pulmonary vascularity is normal. No acute bone. IMPRESSION: 1. Minimal left basilar atelectasis or infiltrate. 2. Post radiation changes within the right hilum. Electronically Signed   By: Worthy Heads M.D.   On: 05/18/2024 01:15    Procedures Procedures  {Document cardiac monitor, telemetry assessment procedure when appropriate:1}  Medications Ordered in ED Medications  cefTRIAXone  (ROCEPHIN ) 1 g in sodium chloride  0.9 % 100 mL  IVPB (has no administration in time range)  azithromycin  (ZITHROMAX ) 500 mg in sodium chloride  0.9 % 250 mL IVPB (has no administration in time range)    ED Course/ Medical Decision Making/ A&P   {   Click here for ABCD2, HEART and other calculatorsREFRESH Note before signing :1}                              Medical Decision Making Amount and/or Complexity of Data Reviewed Labs: ordered. Decision-making details documented in ED Course. Radiology: ordered and independent interpretation performed. Decision-making details documented in ED Course. ECG/medicine tests: ordered and independent interpretation performed. Decision-making details documented in ED Course.   Worsening shortness of breath since yesterday.  Intermittent chest pain with coughing only.  Vitals are stable on arrival but dyspneic with conversation.  EKG without acute ischemia.  Chest x-ray shows stable right perihilar opacities and small pleural effusions.  No pulmonary embolism on CT scan yesterday.  Will treat for possible pneumonia.  Receives antibiotics after cultures are obtained.  {Document critical care time when appropriate:1} {Document review of labs and clinical decision tools ie heart score, Chads2Vasc2 etc:1}  {Document your independent review of radiology images, and any outside records:1} {Document your discussion with family  members, caretakers, and with consultants:1} {Document social determinants of health affecting pt's care:1} {Document your decision making why or why not admission, treatments were needed:1} Final Clinical Impression(s) / ED Diagnoses Final diagnoses:  None    Rx / DC Orders ED Discharge Orders     None

## 2024-05-19 ENCOUNTER — Observation Stay (HOSPITAL_COMMUNITY)

## 2024-05-19 DIAGNOSIS — Z7982 Long term (current) use of aspirin: Secondary | ICD-10-CM | POA: Diagnosis not present

## 2024-05-19 DIAGNOSIS — Z86718 Personal history of other venous thrombosis and embolism: Secondary | ICD-10-CM | POA: Diagnosis not present

## 2024-05-19 DIAGNOSIS — F17210 Nicotine dependence, cigarettes, uncomplicated: Secondary | ICD-10-CM | POA: Diagnosis present

## 2024-05-19 DIAGNOSIS — E66811 Obesity, class 1: Secondary | ICD-10-CM | POA: Diagnosis present

## 2024-05-19 DIAGNOSIS — I5033 Acute on chronic diastolic (congestive) heart failure: Secondary | ICD-10-CM

## 2024-05-19 DIAGNOSIS — I5032 Chronic diastolic (congestive) heart failure: Secondary | ICD-10-CM | POA: Diagnosis present

## 2024-05-19 DIAGNOSIS — G40909 Epilepsy, unspecified, not intractable, without status epilepticus: Secondary | ICD-10-CM | POA: Diagnosis present

## 2024-05-19 DIAGNOSIS — R079 Chest pain, unspecified: Secondary | ICD-10-CM

## 2024-05-19 DIAGNOSIS — J440 Chronic obstructive pulmonary disease with acute lower respiratory infection: Secondary | ICD-10-CM | POA: Diagnosis present

## 2024-05-19 DIAGNOSIS — Z1152 Encounter for screening for COVID-19: Secondary | ICD-10-CM | POA: Diagnosis not present

## 2024-05-19 DIAGNOSIS — R7989 Other specified abnormal findings of blood chemistry: Secondary | ICD-10-CM

## 2024-05-19 DIAGNOSIS — F1490 Cocaine use, unspecified, uncomplicated: Secondary | ICD-10-CM | POA: Diagnosis present

## 2024-05-19 DIAGNOSIS — Z79899 Other long term (current) drug therapy: Secondary | ICD-10-CM | POA: Diagnosis not present

## 2024-05-19 DIAGNOSIS — Z6833 Body mass index (BMI) 33.0-33.9, adult: Secondary | ICD-10-CM | POA: Diagnosis not present

## 2024-05-19 DIAGNOSIS — Z716 Tobacco abuse counseling: Secondary | ICD-10-CM

## 2024-05-19 DIAGNOSIS — J189 Pneumonia, unspecified organism: Secondary | ICD-10-CM

## 2024-05-19 DIAGNOSIS — I110 Hypertensive heart disease with heart failure: Secondary | ICD-10-CM | POA: Diagnosis present

## 2024-05-19 DIAGNOSIS — J9621 Acute and chronic respiratory failure with hypoxia: Secondary | ICD-10-CM

## 2024-05-19 DIAGNOSIS — I2489 Other forms of acute ischemic heart disease: Secondary | ICD-10-CM | POA: Diagnosis present

## 2024-05-19 DIAGNOSIS — F39 Unspecified mood [affective] disorder: Secondary | ICD-10-CM | POA: Diagnosis present

## 2024-05-19 DIAGNOSIS — C3490 Malignant neoplasm of unspecified part of unspecified bronchus or lung: Secondary | ICD-10-CM | POA: Diagnosis present

## 2024-05-19 DIAGNOSIS — D751 Secondary polycythemia: Secondary | ICD-10-CM | POA: Diagnosis present

## 2024-05-19 DIAGNOSIS — Z9981 Dependence on supplemental oxygen: Secondary | ICD-10-CM | POA: Diagnosis not present

## 2024-05-19 DIAGNOSIS — I5A Non-ischemic myocardial injury (non-traumatic): Secondary | ICD-10-CM | POA: Diagnosis present

## 2024-05-19 DIAGNOSIS — Z833 Family history of diabetes mellitus: Secondary | ICD-10-CM | POA: Diagnosis not present

## 2024-05-19 DIAGNOSIS — Z8679 Personal history of other diseases of the circulatory system: Secondary | ICD-10-CM | POA: Diagnosis not present

## 2024-05-19 DIAGNOSIS — Z87820 Personal history of traumatic brain injury: Secondary | ICD-10-CM | POA: Diagnosis not present

## 2024-05-19 LAB — RAPID URINE DRUG SCREEN, HOSP PERFORMED
Amphetamines: NOT DETECTED
Barbiturates: NOT DETECTED
Benzodiazepines: NOT DETECTED
Cocaine: POSITIVE — AB
Opiates: NOT DETECTED
Tetrahydrocannabinol: NOT DETECTED

## 2024-05-19 LAB — LIPID PANEL
Cholesterol: 107 mg/dL (ref 0–200)
HDL: 56 mg/dL (ref >40)
LDL Cholesterol: 44 mg/dL (ref 0–99)
Total CHOL/HDL Ratio: 1.9 ratio
Triglycerides: 36 mg/dL (ref <150)
VLDL: 7 mg/dL (ref 0–40)

## 2024-05-19 LAB — BASIC METABOLIC PANEL WITH GFR
Anion gap: 11 (ref 5–15)
Anion gap: 8 (ref 5–15)
BUN: 9 mg/dL (ref 6–20)
BUN: 11 mg/dL (ref 6–20)
CO2: 26 mmol/L (ref 22–32)
CO2: 23 mmol/L (ref 22–32)
Calcium: 9 mg/dL (ref 8.9–10.3)
Calcium: 8.4 mg/dL — ABNORMAL LOW (ref 8.9–10.3)
Chloride: 107 mmol/L (ref 98–111)
Chloride: 110 mmol/L (ref 98–111)
Creatinine, Ser: 1.17 mg/dL (ref 0.61–1.24)
Creatinine, Ser: 0.84 mg/dL (ref 0.61–1.24)
GFR, Estimated: >60 mL/min (ref >60)
GFR, Estimated: >60 mL/min (ref >60)
Glucose, Bld: 98 mg/dL (ref 70–99)
Glucose, Bld: 94 mg/dL (ref 70–99)
Potassium: 3.9 mmol/L (ref 3.5–5.1)
Potassium: 4.1 mmol/L (ref 3.5–5.1)
Sodium: 144 mmol/L (ref 135–145)
Sodium: 141 mmol/L (ref 135–145)

## 2024-05-19 LAB — CBC
HCT: 49.2 % (ref 39.0–52.0)
Hemoglobin: 16.4 g/dL (ref 13.0–17.0)
MCH: 32.3 pg (ref 26.0–34.0)
MCHC: 33.3 g/dL (ref 30.0–36.0)
MCV: 97 fL (ref 80.0–100.0)
Platelets: 254 10*3/uL (ref 150–400)
RBC: 5.07 MIL/uL (ref 4.22–5.81)
RDW: 15.1 % (ref 11.5–15.5)
WBC: 6.9 10*3/uL (ref 4.0–10.5)
nRBC: 0 % (ref 0.0–0.2)

## 2024-05-19 LAB — ECHOCARDIOGRAM COMPLETE
AR max vel: 4.66 cm2
AV Peak grad: 5.1 mmHg
Ao pk vel: 1.13 m/s
Area-P 1/2: 5.54 cm2
Height: 69 in
S' Lateral: 3.2 cm
Weight: 3597.91 [oz_av]

## 2024-05-19 LAB — RESP PANEL BY RT-PCR (RSV, FLU A&B, COVID)  RVPGX2
Influenza A by PCR: NEGATIVE
Influenza B by PCR: NEGATIVE
Resp Syncytial Virus by PCR: NEGATIVE
SARS Coronavirus 2 by RT PCR: NEGATIVE

## 2024-05-19 LAB — I-STAT CG4 LACTIC ACID, ED: Lactic Acid, Venous: 1.6 mmol/L (ref 0.5–1.9)

## 2024-05-19 LAB — HEMOGLOBIN A1C
Hgb A1c MFr Bld: 6 % — ABNORMAL HIGH (ref 4.8–5.6)
Mean Plasma Glucose: 125.5 mg/dL

## 2024-05-19 LAB — MAGNESIUM: Magnesium: 1.8 mg/dL (ref 1.7–2.4)

## 2024-05-19 LAB — TROPONIN I (HIGH SENSITIVITY)
Troponin I (High Sensitivity): 19 ng/L — ABNORMAL HIGH (ref <18)
Troponin I (High Sensitivity): 15 ng/L (ref <18)

## 2024-05-19 LAB — PROCALCITONIN: Procalcitonin: <0.1 ng/mL

## 2024-05-19 LAB — PHOSPHORUS: Phosphorus: 3.7 mg/dL (ref 2.5–4.6)

## 2024-05-19 LAB — BRAIN NATRIURETIC PEPTIDE: B Natriuretic Peptide: 251.6 pg/mL — ABNORMAL HIGH (ref 0.0–100.0)

## 2024-05-19 MED ORDER — SODIUM CHLORIDE 0.9% FLUSH
3.0000 mL | Freq: Two times a day (BID) | INTRAVENOUS | Status: DC
  Administered 2024-05-19 – 2024-05-22 (×7): 3 mL via INTRAVENOUS

## 2024-05-19 MED ORDER — IPRATROPIUM-ALBUTEROL 0.5-2.5 (3) MG/3ML IN SOLN: 3.0000 mL | Freq: Once | RESPIRATORY_TRACT | Status: DC

## 2024-05-19 MED ORDER — ASPIRIN 81 MG PO CHEW
81.0000 mg | CHEWABLE_TABLET | Freq: Every day | ORAL | Status: DC
  Administered 2024-05-20 – 2024-05-22 (×3): 81 mg via ORAL
  Filled 2024-05-19 (×3): qty 1

## 2024-05-19 MED ORDER — ACETAMINOPHEN 160 MG/5ML PO SOLN
650.0000 mg | Freq: Four times a day (QID) | ORAL | Status: DC | PRN
  Administered 2024-05-19 – 2024-05-21 (×2): 650 mg via ORAL
  Filled 2024-05-19 (×2): qty 20.3

## 2024-05-19 MED ORDER — AMLODIPINE BESYLATE 10 MG PO TABS
10.0000 mg | ORAL_TABLET | Freq: Every morning | ORAL | Status: DC
  Administered 2024-05-19 – 2024-05-22 (×4): 10 mg via ORAL
  Filled 2024-05-19 (×4): qty 1

## 2024-05-19 MED ORDER — ASPIRIN 81 MG PO CHEW: 324.0000 mg | CHEWABLE_TABLET | Freq: Once | ORAL | Status: DC

## 2024-05-19 MED ORDER — ENOXAPARIN SODIUM 60 MG/0.6ML IJ SOSY
50.0000 mg | PREFILLED_SYRINGE | INTRAMUSCULAR | Status: DC
  Administered 2024-05-19 – 2024-05-22 (×4): 50 mg via SUBCUTANEOUS
  Filled 2024-05-19 (×4): qty 0.6

## 2024-05-19 MED ORDER — CLONIDINE HCL 0.1 MG PO TABS
0.1000 mg | ORAL_TABLET | Freq: Every day | ORAL | Status: DC
  Administered 2024-05-19 – 2024-05-21 (×2): 0.1 mg via ORAL
  Filled 2024-05-19 (×3): qty 1

## 2024-05-19 MED ORDER — CLONIDINE HCL 0.1 MG PO TABS
0.2000 mg | ORAL_TABLET | Freq: Two times a day (BID) | ORAL | Status: DC
  Administered 2024-05-19 – 2024-05-22 (×7): 0.2 mg via ORAL
  Filled 2024-05-19 (×7): qty 2

## 2024-05-19 MED ORDER — NICOTINE 7 MG/24HR TD PT24
7.0000 mg | MEDICATED_PATCH | Freq: Every day | TRANSDERMAL | Status: DC
  Administered 2024-05-19 – 2024-05-22 (×4): 7 mg via TRANSDERMAL
  Filled 2024-05-19 (×4): qty 1

## 2024-05-19 MED ORDER — ALBUTEROL SULFATE (2.5 MG/3ML) 0.083% IN NEBU: 2.5000 mg | INHALATION_SOLUTION | RESPIRATORY_TRACT | Status: DC | PRN

## 2024-05-19 MED ORDER — LEVETIRACETAM 500 MG PO TABS
500.0000 mg | ORAL_TABLET | Freq: Two times a day (BID) | ORAL | Status: DC
  Administered 2024-05-19 – 2024-05-22 (×7): 500 mg via ORAL
  Filled 2024-05-19 (×7): qty 1

## 2024-05-19 MED ORDER — MORPHINE SULFATE 10 MG/5ML PO SOLN
2.5000 mg | ORAL | Status: DC | PRN
  Administered 2024-05-20 (×2): 2.5 mg via ORAL
  Filled 2024-05-19 (×2): qty 5

## 2024-05-19 MED ORDER — SERTRALINE HCL 50 MG PO TABS
25.0000 mg | ORAL_TABLET | Freq: Every day | ORAL | Status: DC
  Administered 2024-05-19 – 2024-05-22 (×4): 25 mg via ORAL
  Filled 2024-05-19 (×4): qty 1

## 2024-05-19 MED ORDER — AZITHROMYCIN 250 MG PO TABS
500.0000 mg | ORAL_TABLET | Freq: Every day | ORAL | Status: AC
  Administered 2024-05-20 – 2024-05-21 (×2): 500 mg via ORAL
  Filled 2024-05-19 (×2): qty 2

## 2024-05-19 MED ORDER — IPRATROPIUM-ALBUTEROL 0.5-2.5 (3) MG/3ML IN SOLN
3.0000 mL | Freq: Four times a day (QID) | RESPIRATORY_TRACT | Status: DC
  Administered 2024-05-19 – 2024-05-20 (×5): 3 mL via RESPIRATORY_TRACT
  Filled 2024-05-19 (×5): qty 3

## 2024-05-19 MED ORDER — HYDROCHLOROTHIAZIDE 12.5 MG PO TABS
12.5000 mg | ORAL_TABLET | Freq: Every morning | ORAL | Status: DC
  Administered 2024-05-19 – 2024-05-22 (×4): 12.5 mg via ORAL
  Filled 2024-05-19 (×4): qty 1

## 2024-05-19 MED ORDER — LOSARTAN POTASSIUM 50 MG PO TABS
100.0000 mg | ORAL_TABLET | Freq: Every day | ORAL | Status: DC
  Administered 2024-05-19 – 2024-05-22 (×4): 100 mg via ORAL
  Filled 2024-05-19 (×4): qty 2

## 2024-05-19 MED ORDER — FUROSEMIDE 10 MG/ML IJ SOLN
40.0000 mg | Freq: Two times a day (BID) | INTRAMUSCULAR | Status: DC
  Administered 2024-05-19 – 2024-05-20 (×2): 40 mg via INTRAVENOUS
  Filled 2024-05-19 (×2): qty 4

## 2024-05-19 MED ORDER — SODIUM CHLORIDE 0.9 % IV SOLN
1.0000 g | INTRAVENOUS | Status: DC
  Administered 2024-05-20 – 2024-05-22 (×3): 1 g via INTRAVENOUS
  Filled 2024-05-19 (×3): qty 10

## 2024-05-19 MED ORDER — NICOTINE POLACRILEX 2 MG MT GUM
2.0000 mg | CHEWING_GUM | OROMUCOSAL | Status: DC | PRN
  Filled 2024-05-19: qty 1

## 2024-05-19 MED ORDER — BUDESON-GLYCOPYRROL-FORMOTEROL 160-9-4.8 MCG/ACT IN AERO
2.0000 | INHALATION_SPRAY | Freq: Two times a day (BID) | RESPIRATORY_TRACT | Status: DC
  Administered 2024-05-19 – 2024-05-22 (×7): 2 via RESPIRATORY_TRACT
  Filled 2024-05-19: qty 5.9

## 2024-05-19 NOTE — Progress Notes (Signed)
 Patient c/o chest pain (has had it since admission) that is radiating up his right leg to his chest. He has no pain medications ordered but does not like taking pills. MD notified and patient given liquid tylenol .

## 2024-05-19 NOTE — Care Management Obs Status (Signed)
 MEDICARE OBSERVATION STATUS NOTIFICATION   Patient Details  Name: Richard [name] MRN: [id number] Date of Birth: 1971-11-27   Medicare Observation Status Notification Given:  Yes    Terre Ferri, RN 05/19/2024, 11:08 AM

## 2024-05-19 NOTE — Progress Notes (Signed)
 Resources placed on AVS.

## 2024-05-19 NOTE — ED Provider Notes (Signed)
 [facility] Provider Note   CSN: [id number] Arrival date & time: 05/18/24  0015     History  Chief Complaint  Patient presents with   Shortness of Breath   Leg Swelling   Chest Pain    Richard [name] is a 53 y.o. male.  53 year old presents ER today secondary to dyspnea on exertion.  Patient had been walking around earlier today without his home oxygen  and became dyspneic.  EMS brought him in for further evaluation.  On his 4 to 5 L he feels fine.  Patient states he had been coughing little bit recently.  Denies any lower extremity swelling.  No other associated symptoms.   Shortness of Breath Associated symptoms: chest pain   Chest Pain Associated symptoms: shortness of breath        Home Medications Prior to Admission medications   Medication Sig Start Date End Date Taking? Authorizing Provider  albuterol  (PROVENTIL ) (2.5 MG/3ML) 0.083% nebulizer solution Take 2.5 mg by nebulization 3 (three) times daily as needed for shortness of breath or wheezing. 05/11/24  Yes [provider]  albuterol  (VENTOLIN  HFA) 108 (90 Base) MCG/ACT inhaler Inhale 2 puffs into the lungs every 6 (six) hours as needed for wheezing or shortness of breath. 09/24/23  Yes Hunsucker, Archer Kobs, MD  amLODipine  (NORVASC ) 10 MG tablet Take 10 mg by mouth every morning. 08/06/23  Yes [provider]  Aspirin -Acetaminophen  (GOODYS BODY PAIN PO) Take 1 packet by mouth daily as needed (for pain).   Yes [provider]  cloNIDine  (CATAPRES ) 0.1 MG tablet Take 0.1 mg by mouth. Take 2 tablets by mouth at 9am and 5pm and 1 tablet at bedtime   Yes [provider]  doxycycline  (VIBRAMYCIN ) 100 MG capsule Take 1 capsule (100 mg total) by mouth 2 (two) times daily. One po bid x 7 days Patient not taking: Reported on 05/19/2024 05/18/24  Yes Josette Shimabukuro, Reymundo Caulk, MD  Fluticasone -Umeclidin-Vilant (TRELEGY ELLIPTA ) 200-62.5-25 MCG/ACT AEPB Inhale 1 puff into  the lungs daily. 09/24/23  Yes Hunsucker, Archer Kobs, MD  hydrochlorothiazide  (HYDRODIURIL ) 12.5 MG tablet Take 12.5 mg by mouth every morning. 08/06/23  Yes [provider]  levETIRAcetam  (KEPPRA ) 500 MG tablet Take 1 tablet (500 mg total) by mouth 2 (two) times daily. 01/17/23  Yes Adria Hopkins, MD  losartan  (COZAAR ) 100 MG tablet Take 100 mg by mouth daily. 12/31/22  Yes [provider]  sertraline  (ZOLOFT ) 25 MG tablet Take 25 mg by mouth daily. 07/23/23  Yes [provider]      Allergies    Dilaudid  [hydromorphone  hcl] and Tape    Review of Systems   Review of Systems  Respiratory:  Positive for shortness of breath.   Cardiovascular:  Positive for chest pain.    Physical Exam Updated Vital Signs BP (!) 141/103   Pulse 100   Temp 98.4 F (36.9 C) (Oral)   Resp (!) 31   Ht 5\' 9"  (1.753 m)   Wt 102.1 kg   SpO2 93%   BMI 33.23 kg/m  Physical Exam Vitals and nursing note reviewed.  Constitutional:      Appearance: He is well-developed.  HENT:     Head: Normocephalic and atraumatic.  Cardiovascular:     Rate and Rhythm: Normal rate.  Pulmonary:     Effort: Pulmonary effort is normal. No respiratory distress.     Breath sounds: Decreased breath sounds and wheezing present.  Abdominal:     General: There  is no distension.  Musculoskeletal:        General: Normal range of motion.     Cervical back: Normal range of motion.  Neurological:     Mental Status: He is alert.     ED Results / Procedures / Treatments   Labs (all labs ordered are listed, but only abnormal results are displayed) Labs Reviewed  CBC WITH DIFFERENTIAL/PLATELET - Abnormal; Notable for the following components:      Result Value   Hemoglobin 18.2 (*)    HCT 52.5 (*)    RDW 16.4 (*)    Lymphs Abs 0.4 (*)    All other components within normal limits  COMPREHENSIVE METABOLIC PANEL WITH GFR - Abnormal; Notable for the following components:   CO2 16 (*)    Calcium 8.8  (*)    Anion gap 16 (*)    All other components within normal limits  BRAIN NATRIURETIC PEPTIDE - Abnormal; Notable for the following components:   B Natriuretic Peptide 258.7 (*)    All other components within normal limits  D-DIMER, QUANTITATIVE - Abnormal; Notable for the following components:   D-Dimer, Quant 0.90 (*)    All other components within normal limits  TROPONIN I (HIGH SENSITIVITY) - Abnormal; Notable for the following components:   Troponin I (High Sensitivity) 18 (*)    All other components within normal limits  TROPONIN I (HIGH SENSITIVITY) - Abnormal; Notable for the following components:   Troponin I (High Sensitivity) 23 (*)    All other components within normal limits    EKG None  Radiology DG Chest 2 View Result Date: 05/18/2024 CLINICAL DATA:  Chest pain EXAM: CHEST - 2 VIEW COMPARISON:  Chest x-ray 05/18/2024.  CT of the chest 05/18/2024. FINDINGS: The right hilum appears enlarged and there is right perihilar airspace opacities similar to the prior study. Scarring in the right upper lobe is again noted. Scattered interstitial opacities in the left lower lung are unchanged. There is a small right pleural effusion. There is no pneumothorax. The cardiac silhouette is within normal limits. No acute fractures are seen. IMPRESSION: 1. Stable enlarged right hilum with right perihilar airspace opacities. 2. Small right pleural effusion. Electronically Signed   By: Tyron Gallon M.D.   On: 05/18/2024 23:22   CT Angio Chest PE W and/or Wo Contrast Result Date: 05/18/2024 EXAM: CTA of the Chest with contrast for PE 05/18/2024 04:17:28 AM TECHNIQUE: CTA of the chest was performed after the administration of intravenous contrast, with and without IV contrast. Multiplanar reformatted images are provided for review. MIP images are provided for review. Automated exposure control, iterative reconstruction, and/or weight based adjustment of the mA/kV was utilized to reduce the radiation  dose to as low as reasonably achievable. 75mL of ioheixol (OMNIPAQUE ) 350 MG/ML injection was used. COMPARISON: Comparison is made to 1 view chest x-ray on 05/18/2024 and CT chest with contrast on 12/17/2023. CLINICAL HISTORY: Pulmonary embolism (PE) suspected, low to intermediate probability, positive D-dimer. The patient has a personal history of non-small cell lung cancer. The patient was out walking and was smoking a cigarette when he started to have increasing shortness of breath [facility]). The patient wears 5L nasal cannula () at baseline but was not wearing his oxygen  on his walk. The patient reports having chest pain, leg swelling, and SHOB for a year. FINDINGS: PULMONARY ARTERIES: Pulmonary arteries are adequately opacified for evaluation. No pulmonary embolism. Main pulmonary artery is normal in caliber. MEDIASTINUM: The heart and pericardium demonstrate  no acute abnormality. There is no acute abnormality of the thoracic aorta. LYMPH NODES: No mediastinal, hilar or axillary lymphadenopathy. LUNGS AND PLEURA: The lungs show centrilobular emphysema. Post treatment changes in the right upper lobe and right hilum are stable. Diffuse peribronchial micronodularity is now present. A new pleural-based nodule is present in the posteromedial left lower lobe that measures 13 x 11 mm. No pleural effusion or pneumothorax. UPPER ABDOMEN: A left adrenal nodule is stable. SOFT TISSUES AND BONES: No acute bone or soft tissue abnormality. IMPRESSION: 1. No pulmonary embolism. 2. Diffuse peribronchial micronodular pattern reflects an acute inflammatory or infectious process. 3. New 13 x 11 mm pleural-based nodule in the posteromedial left lower lobe. This may represent rounded atelectasis. 4. Stable post treatment changes in the right upper lobe and right hilum. Electronically signed by: Audree Leas MD 05/18/2024 04:35 AM EDT RP Workstation: [id number]   DG Chest Portable 1 View Result Date: 05/18/2024 CLINICAL  DATA:  Dyspnea EXAM: PORTABLE CHEST 1 VIEW COMPARISON:  None Available. FINDINGS: Masslike consolidation within the right hilum reflects post radiation changes better appreciated on CT examination of 12/17/2023. Parenchymal scarring within the right mid lung zone. Minimal left basilar atelectasis or infiltrate. Lungs are otherwise clear. No pneumothorax or pleural effusion. Cardiac size is at the upper limits of normal. Pulmonary vascularity is normal. No acute bone. IMPRESSION: 1. Minimal left basilar atelectasis or infiltrate. 2. Post radiation changes within the right hilum. Electronically Signed   By: Worthy Heads M.D.   On: 05/18/2024 01:15    Procedures Procedures    Medications Ordered in ED Medications  iohexol  (OMNIPAQUE ) 350 MG/ML injection 75 mL (75 mLs Intravenous Contrast Given 05/18/24 0418)  ipratropium-albuterol  (DUONEB) 0.5-2.5 (3) MG/3ML nebulizer solution 3 mL (3 mLs Nebulization Given 05/18/24 0456)    ED Course/ Medical Decision Making/ A&P                                 Medical Decision Making Amount and/or Complexity of Data Reviewed Labs: ordered. Radiology: ordered.  Risk Prescription drug management.   Mildly elevated BNP but no crackles or fluid on CT or x-ray.  CT does show possible developing pneumonia versus inflammation.  Antibiotics started.  Patient observed in the ER for multiple hours.  Patient states he feels at baseline.  States his breathing seems to be better.  Will start antibiotics.  DC with close PCP follow-up.  Return if new or worsening symptoms.  Final Clinical Impression(s) / ED Diagnoses Final diagnoses:  Dyspnea, unspecified type    Rx / DC Orders ED Discharge Orders          Ordered    doxycycline  (VIBRAMYCIN ) 100 MG capsule  2 times daily        05/18/24 0615              Estrellita Lasky, Reymundo Caulk, MD 05/19/24 0423

## 2024-05-19 NOTE — Plan of Care (Signed)
  Problem: Clinical Measurements: Goal: Respiratory complications will improve Outcome: Progressing   Problem: Activity: Goal: Risk for activity intolerance will decrease Outcome: Progressing   Problem: Nutrition: Goal: Adequate nutrition will be maintained Outcome: Progressing   Problem: Clinical Measurements: Goal: Cardiovascular complication will be avoided Outcome: Not Progressing

## 2024-05-19 NOTE — Progress Notes (Signed)
  Echocardiogram 2D Echocardiogram has been performed.  Richard [name] 05/19/2024, 4:52 PM

## 2024-05-19 NOTE — Discharge Instructions (Signed)
 SUNDAYS BREAKFAST TWO LOCATIONS: 8:00am served in [facility] by Awaken PPL Corporation 8:30am SHUTTLE provided from [facility] [place], served at Apache Corporation, [address] LUNCH TWO LOCATIONS [plus one additional third Sunday only] 10:30am - 12:30pm served at [employer], Liberty Global, [place] [address] (1.2 miles from [facility]) 12:30pm served in [place] by Land O'Lakes Team (THIRD Sunday only) 1:30pm served at [facility] by [facility] one location [plus one additional third Sunday only] 5:00pm Every Sunday, served under the bridge at [address] by Lindell Noe Under the 3M Company (.7 miles from [facility]) (THIRD Sunday ONLY) 4:00pm served in the parking garage, across from Nucor Corporation, corner of [place] and [place] by Ryland Group Works Ministries MONDAYS BREAKFAST 7:30am served in [employer] by the United States Steel Corporation and Friends LUNCH 10:30am - 12:30pm served at [employer], Liberty Global, [place] [address] (1.2 miles from [facility]) DINNER TWO LOCATIONS: 7:00pm served in front of the courthouse at the corner of Goldman Sachs and International Business Machines. by [facility] Monday Night Meal (3 blocks from [facility]) 4:30pm served at the Auto[employer]ation, [address] by The Procter & Gamble Not Bombs (0.6 miles from [facility]) [place] BREAKFAST 8:00am - 9:00am served at The TJX Companies, [address] (0.3 miles from [place]) LUNCH 10:30am - 12:30pm served at the [employer], Liberty Global [address] (1.2 miles from [place]) DINNER 6:00pm served at CSX Corporation, enter from Capital One and go to the Sonic Automotive, (0.7 miles from [place]) [facility] BREAKFAST 7:00am - 8:00am served at [employer], [employer] [address] (1.2 miles from [place]) LUNCH ONE LOCATION [plus two additional locations listed below] 10:30am - 12:30pm served at [employer], [employer] [address] (1.2 miles from [place]) (FIRST Wednesday ONLY) 11:30am served at Dillard's, [place] [address] (6.6 miles from [place]) (SECOND Wednesday ONLY) 11:00am served at [place]. Melvyn Novas of [address], [address] (1.3 miles from [place]) [place] TWO LOCATIONS 6:00pm served at W. R. Berkley, [place] W. Viste[address]. (1.3 miles from [facility]) 4:00pm - 6:00pm (hot dogs and chips) served at Levi Strauss of [facility], [address] (1.7 miles from [place]) [place] BREAKFAST NOT AVAILABLE AT THIS TIME LUNCH 10:30am - 12:30pm served at [employer], Liberty Global, [place] [address] (1.2 miles from [place]) DINNER 6:00pm served at CSX Corporation, enter from Capital One and go to the Sonic Automotive, (0.7 miles from Nucor Corporation) [place] BREAKFAST NOT AVAILABLE AT THIS TIME LUNCH 10:30am - 12:30pm served at [employer], Liberty Global [address] (1.2 miles from [place]) DINNER TWO LOCATIONS, [plus one additional first Friday only] 6:00pm served under the bridge at [address] by Lindell Noe Under CSX Corporation. (.7 miles from [facility]) 5:00pm - 7:00pm served at Levi Strauss of [facility], [address] (1.7 miles from [place]) (FIRST Friday ONLY) 5:45 pm - SHUTTLE provided from the LIBRARY at 5:45pm. Served at [facility], [address]. SATURDAYS BREAKFAST TWO LOCATIONS [plus one additional last Saturday only] 8:00am served at [facility] by Gannett Co  Bikers 8:30am served at Pulte Homes, [address]. (2.2 miles from [facility]) (LAST Saturday ONLY) 8:30am served at  Beazer Homes, [address] (5 miles from [place]) LUNCH 10:30am - 12:30pm served at [employer], Liberty Global [address] (1.2 miles from [facility]) DINNER 6:00pm served under the bridge at [address] by World Fuel Services Corporation (0.7 miles from Nucor Corporation)  DIRECTIONS FROM [place] TO ALL MEAL LOCATIONS The Bridge at [address] (.7 miles from [address]) [address] on [place]. Turn Right onto DIRECTV 433 ft. Continue onto [address] under bridge, about 500 ft. Courthouse (3 blocks from [facility]) [place] on [address]. Turn right on [place] 1 block to PPL Corporation (.5 miles from [place]) Edison on [name]. YRC Worldwide. past Brink's Company to EMCOR. Enter from Capital One and go to the Affiliated Computer Services building W. R. Berkley [address] Visteon [address]. (1.3 miles from [facility]) [address] on [place]. Turn Right onto [address]. church will be on the Left. The TJX Companies [address] (.3 miles from [facility]) Go .3 miles on W. Friendly Destination is on your right Dillard's at ONEOK (6.6 miles from [address]) [address] on [place] toward W Friendly Turn right onto W Friendly Continue onto Alcoa Inc. Continue onto Toll Brothers. 5. Elesa Hacker is on right [facility] Conseco) [address] (.6 miles from [address]) [place] on [name] Turn Left onto [address] 0.3 miles Destination is on the Left. [place] Black & Decker at American Express (5 miles from Nucor Corporation) 1. Head south on [address]. Turn right onto [address] Friend[address] Turn slightly left onto Quest Diagnostics Continue onto Quest Diagnostics Turn right at Barnes & Noble Continue to church on right [place] Sounds [name] [address] (1.7 miles  from [place]) [address] on [place] 1.4 miles [place] becomes [place]. El[address] Continue 0.6 miles and church will be on theright. [place] Guardian Life Insurance at [address] (2.5 miles from Nucor Corporation) [place] provided from Massachusetts Mutual Life Park] [place] on [name]. Elm toward Estée Lauder right onto Costco Wholesale left onto Henry Schein left onto Micron Technology [address] Southern Company (2.2 miles from [address]) [address] on [place] 1.4 miles [place] becomes [place]. Elm [address] Turn right onto W. [address] and church will be on the Left. Potter's House/[place] AT&T [address] (1.2 miles from [facility]) 1.Turn right onto [facility] 2.Turn left onto Rogue Jury 3.Reino Kent 4.Destination is on your right [place]. Melvyn Novas of [address] at ToysRus (1.3 miles from [facility]) [address] on [address] Turn left onto Genuine Parts right onto S. [place]. Continue onto KB Home	[place]. Turn left onto Smurfit-Stone Container. Turn right onto WellPoint.    [facility] assistance programs. If you are behind on your bills and expenses, and need some help to make it through a short term hardship or financial emergency, there are several organizations and charities in the [place] and [place] area that may  be able to help. They range from the Pathmark Stores, Liberty Global, Landscape architect of Weyerhaeuser Company and the local community action agency, the Intel, Avnet. These groups may be able to provide you resources to help pay your utility bills, rent, and they even offer housing assistance.  Crisis assistance program Find help for paying your rent, electric bills, free food, and even funds to pay your mortgage. The Liberty Global [phone]) offers several services to local families, as funding allows. The Emergency Assistance Program (EAP), which they administer, provides  household goods, free food, clothing, and financial aid to people in need in the [facility] area. The EAP program does have some qualification, and counselors will interview clients for financial assistance by written referral only. Referrals need to be made by the Department of Social Services or by other EAP approved human services agencies or charities in the area.  Money for resources for emergency assistance are available for security deposits for rent, water, electric, and gas, past due rent, utility bills, past due mortgage payments, food, and clothing. The Liberty Global also operates a Programme researcher, broadcasting/film/video on the site. More Liberty Global.  Open Door Ministries of [employer]Pal[employer], which can be reached at [phone], offers emergency assistance programs for those in need of help, such as food, rent assistance, a soup kitchen, shelter, and clothing. They are based in [facility] but provide a number of services to those that qualify for assistance. Continue with Open Door Ministries programs.  [facility] Department of Social Services may be able to offer temporary financial assistance and cash grants for paying rent and utilities. Help may be provided for local county residents who may be experiencing personal crisis when other resources, including government programs, are not available. Call [phone]  [place]. [place] Society, which is based in [place], provides financial assistance of up to $50.00 to help pay for rent, utilities, cooling bills, rent, and prescription medications. The program also provides secondhand furniture to those in need. [phone]  Mattel is a Geneticist, molecular. The organization can offer emergency assistance for paying rent, electric bills, utilities, food, household products and furniture. They offer extensive emergency and transitional housing for families, children and  single women, and also run a Boy's and Dole Food. 301 Thrift Shops, CMS Energy Corporation, and other aid offered too. [address] [place], [place] [address], [phone]  Additional locations of the Pathmark Stores are in [place] and other nearby communities. When you have an emergency, need free food, money for basic needs, or just need assistance around Christmas, then the Pathmark Stores may have the resources you need. Or they can refer you to nearby agencies. Learn more.  [place] Low Income Risk manager - This is offered for [facility] families. The federal government created CIT Group Program provides a one-time cash grant payment to help eligible low-income families pay their electric and heating bills. [address] [place], [place] [address], [phone]  Government and Motorola - The county administers several emergency and self-sufficiency programs. Residents of [place] can get help with energy bills and food, rent, and other expenses. In addition, work with a Sports coach who may be able to help you find a job or improve your employment skills. More [place] public assistance.  [place] Emergency Assistance - A program offers emergency utility and rent funds  for greater [place] area residents. The program can also provide counseling and referrals to charities and government programs. Also provides food and a free meal program that serves lunch Mondays - Saturdays and dinner seven days per week to individuals in the community. [address] [place], [place] [address], [phone]  Parker Hannifin - Offers affordable apartment and housing communities across [place] and [place]. The low income and seniors can access public housing, rental assistance to qualified applicants, and apply for the section 8 rent subsidy program. Other programs include Chiropractor and  Engineer, maintenance. [address] [place], [place] [address], dial [phone].  Basic needs such as clothing - Low income families can receive free items (school supplies, clothes, holiday assistance, etc.) from clothing closets while more moderate income [address] families can shop at Caremark Rx. Locations across the area help the needy. Get information on [place] Triad free clothing centers.  The [facility] provides transitional housing to veterans and the disabled. Clients will also access other services too, including life skills classes, case management, and assistance in finding permanent housing. [address] [place], [place] [address], call [phone]  [place] Transitional Housing in [place] is for people who were just evicted or that are formerly homeless. The non-profit will also help then gain self-sufficiency, find a home or apartment to live in, and also provides information on rent assistance when needed. Dial [phone]  [employer] Partnership to End Homelessness is available in [place]. Families that were evicted or that are homeless can gain shelter, food, clothing, furniture, and also emergency financial assistance. Other services include financial skills and life skills coaching, job training, and case management. [address] [place], [place] [address]. Telephone [phone].  The Dynegy, Avnet. runs the Ford Motor Company. This can help people save money on their heating and summer cooling bills, and is free to low income families. Free upgrades can be made to your home. Phone [phone]  Many of the non-profits and programs mentioned above are all inclusive, meaning they can meet many needs of the low income, such as energy bills, food, rent, and more. However there are several organizations that focus just on rent and housing. Read more on rent  assistance in [place] region.  Legal assistance for evictions, foreclosure, and more If you need free legal advice on civili issues, such as foreclosures, evictions, Electronics engineer, government programs, domestic issues and more, Armed forces operational officer Aid of [facility]) is a Associate Professor firm that provides free legal services and counsel to lower income people, seniors, disabled, and others. The goal is to ensure everyone has access to justice and fair representation.  Call them at [phone], or click here to learn more about [place] free legal assistance programs.  [place] Avnet and funds for emergency expenses The Pathmark Stores is another organization that can provide people with Deere & Company and funds to pay bills. Their assistance depends on funding, and the demand for help is always very high. They can provide cash to help pay rent, a missed mortgage payment, or gas, electric, and water bills. But the assistance doesn't stop there. They also have a food pantry on site, which can provide food once every three (3) months to people who need help. The KeyCorp can also offer a Engineering geologist once every three (3) months for a maximum three (3) times. After receiving this voucher over  that period of time, applicants can receive this aid one every six (6) months after that. [phone].  Kohl's action agency The Intel, Avnet. offers job and Dispensing optician. Resources are focused on helping students obtain the skills and experiences that are necessary to compete in today's challenging and tight job market. The non-profit faith-based community action agency offers internship trainings as well as classroom instruction. Economically disadvantaged and challenged individuals and potential employers can use their services. Classes are tailored to meet the needs of people in the [facility] region. [place], [place] [address],  [phone]    Foreclosure prevention services Housing Counseling and Education is also offered by MeadWestvaco of the [place]. The agency (phone number is below) is a Engineer, structural providing foreclosure advice and counseling. They offer mortgage resolution counseling and also reverse mortgage counseling. Counselors can direct people to both Kimberly-Clark, as well as Weyerhaeuser Company foreclosure assistance options.  Warehouse manager has locations in [place] and [employer]. They run debt and foreclosure prevention programs for local families. A sampling of the programs offered include both Budget and Housing Counseling. This includes money management, financial advice, budget review and development of a written action plan with a Pensions consultant to help solve specific individual financial problems. In addition, housing and mortgage counselors can also provide pre- and post-purchase homeownership counseling, default resolution counseling (to prevent foreclosure) and reverse mortgage counseling. A Debt Management Program allows people and families with a high level of credit card or medical debt to consolidate and repay consumer debt and loans to creditors and rebuild positive credit ratings and scores. [phone]  Debt assistance programs Receive free counseling and debt help from [facility] of the [place]. The [facility] based agency can be reached at [phone]. The counselors provide free help, and the services include budget counseling. This will help people manage their expenses and set goals. They also offer a Forensic scientist, which will help individuals consolidate their debts and become debt free. Most of the workshops and services are free.  Community clinics in [place] Five of the leading health and dental centers are listed below.  They may be able to provide medication, physicals, dental care, and general family care to residents of all incomes and backgrounds across the region. Some of the programs focus on the low income and underinsured. However if these clinics can't meet your needs, find information and details on more clinics in [facility].  Some of the options include Marriott of [employer]. This center provides free or low cost health care to low-income adults 18 - 64, who have no health insurance. Among other services offered include a pharmacy and eye clinic. Phone [phone]  [facility], which is located in [place], is a community clinic that provides primary medical and health care to uninsured and underinsured adults and families, as well as the low income, in the greater [place] area on a sliding-fee scale. Call [phone]  [place] Adult Dental Program - They run a dental assistance program that is organized by [facility] to provide dental services and aid to Sempra Energy. Services offered by the dental clinic are limited to extractions, pain management, and minor restorative care. [phone]  [place] [facility] Health has locations in [facility] and [place]. The community clinics provide complete pediatric care including primary health,  mental health, social work, neurology, cardiology, asthma. Dial [phone].  In addition to those [address] and Safeway Inc, find other free community clinics in [place] and across the county.  Food pantry and assistance Some of the local food pantries and distribution centers to call for free food and groceries include The Hive of [place]  (phone [phone]), The [facility] (phone [phone]) and also PPL Corporation. Dial [phone].  Several other food banks in the region provide clothing, free food and meals, access to  soup kitchens and other help. Find the addresses and phone numbers of more food pantries in [place]. http://www.needhelppayingbills.com/html/[place]_assistance_pro.html

## 2024-05-19 NOTE — ED Notes (Signed)
 Pt ambulated in the halls. No new SHOB but sats dropped to about 84-85% on his baseline 4L [place]. Once sitting back down and in bed, sats back up to 92-93%.

## 2024-05-19 NOTE — TOC Initial Note (Addendum)
 Transition of Care (TOC) - Initial/Assessment Note   Spoke to patient at bedside. PAtient from home with wife.   Has home oxygen  with [facility] , he states 4 to 5 L [place] continuous. He does have  portable oxygen  however states it is not working. Patient has not notified [employer] regarding same . NCM called Raechel Bulla with [employer] regarding his portable not working. [employer] will call patient   At discharge his wife will provide transportation home  Patient Details  Name: Richard [name] MRN: [id number] Date of Birth: 04/11/1971  Transition of Care [facility]) CM/SW Contact:    Terre Ferri, RN Phone Number: 05/19/2024, 11:23 AM  Clinical Narrative:                   Expected Discharge Plan: Home/Self Care Barriers to Discharge: Continued Medical Work up   Patient Goals and CMS Choice Patient states their goals for this hospitalization and ongoing recovery are:: to return to home   Choice offered to / list presented to : NA      Expected Discharge Plan and Services   Discharge Planning Services: CM Consult Post Acute Care Choice: NA Living arrangements for the past 2 months: Single Family Home                 DME Arranged:  (see note)         HH Arranged: NA HH Agency: NA        Prior Living Arrangements/Services Living arrangements for the past 2 months: Single Family Home Lives with:: Spouse Patient language and need for interpreter reviewed:: Yes Do you feel safe going back to the place where you live?: Yes      Need for Family Participation in Patient Care: Yes (Comment) Care giver support system in place?: Yes (comment) Current home services: DME Criminal Activity/Legal Involvement Pertinent to Current Situation/Hospitalization: No - Comment as needed  Activities of Daily Living   ADL Screening (condition at time of admission) Independently performs ADLs?: Yes (appropriate for developmental age) Is the patient deaf or have difficulty hearing?: No Does the patient  have difficulty seeing, even when wearing glasses/contacts?: No Does the patient have difficulty concentrating, remembering, or making decisions?: No  Permission Sought/Granted   Permission granted to share information with : Yes, Verbal Permission Granted     Permission granted to share info w AGENCY: [facility]        Emotional Assessment Appearance:: Appears stated age Attitude/Demeanor/Rapport: Engaged Affect (typically observed): Appropriate Orientation: : Oriented to Self, Oriented to Place, Oriented to Situation, Oriented to  Time Alcohol / Substance Use: Not Applicable (see note CAGE) Psych Involvement: No (comment)  Admission diagnosis:  CAP (community acquired pneumonia) [J18.9] Acute on chronic respiratory failure with hypoxia (HCC) [J96.21] Patient Active Problem List   Diagnosis Date Noted   Elevated troponin 05/19/2024   Cocaine use 05/19/2024   Encounter for smoking cessation counseling 05/19/2024   Malignant neoplasm of overlapping sites of lung (HCC) 09/17/2023   Pneumonia of left lung due to infectious organism 09/17/2023   Acute on chronic respiratory failure with hypoxia (HCC) 09/17/2023   CAP (community acquired pneumonia) 09/16/2023   H/O: CVA (cerebrovascular accident) 09/16/2023   H/O deep vein thrombophlebitis of lower extremity 09/16/2023   Acute respiratory failure with hypoxia (HCC) 09/16/2023   Left leg swelling 01/15/2023   Knee pain, left 01/11/2023   Acute hypoxic respiratory failure (HCC) 01/10/2023   Acute bronchiolitis due to respiratory syncytial virus (RSV) 01/10/2023  History of seizures 01/10/2023   Hypertension 12/25/2022   Malignant neoplasm of right lung (HCC) 12/25/2022   Rash 06/04/2022   Chronic respiratory failure with hypoxia (HCC) 06/04/2022   Mediastinal lymphadenopathy    Obstructive pneumonia    Counseling regarding advance care planning and goals of care    Malignant neoplasm of overlapping sites of right lung (HCC)  02/03/2022   COPD exacerbation (HCC) 01/28/2022   Right sided weakness 10/13/2021   Mass of right lung 04/09/2020   Alcohol abuse with intoxication (HCC) 03/04/2020   Hypoxia 03/04/2020   Swollen testicle    Atelectasis    Syncope 08/26/2015   Acute encephalopathy 08/26/2015   Seizure (HCC)    Acute pulmonary edema (HCC)    Lower leg DVT (deep venous thromboembolism), acute (HCC)    Protein-calorie malnutrition, severe (HCC) 02/19/2015   Bacteremia due to Staphylococcus 02/17/2015   Alcohol withdrawal delirium (HCC)    Altered mental status    Metabolic acidosis    Essential hypertension    Encephalopathy 02/11/2015   Alcohol intoxication (HCC) 02/11/2015   Lactic acidosis 02/11/2015   Bleeding from the nose 03/19/2014   TIA (transient ischemic attack) 12/24/2013   Weakness 12/23/2013   Left-sided weakness 12/23/2013   Malignant hypertension 12/23/2013   Seizure disorder (HCC) 12/23/2013   Vomiting 06/18/2013   Chest pain 06/18/2013   Hypertensive urgency 10/30/2012   Migraine variant 10/30/2012   TOBACCO ABUSE 01/11/2008   HYPERTENSION, BENIGN 11/24/2007   CHRONIC FRONTAL SINUSITIS 11/24/2007   Convulsions (HCC) 11/24/2007   Headache(784.0) 11/24/2007   PCP:  [facility], [address] Pharmacy:   [facility] [phone] - Jonette Nestle, [place] - [address] AT [facility] [address] [place] [address] Phone: [phone] Fax: [phone]     Social Drivers of Health (SDOH) Social History: SDOH Screenings   Food Insecurity: Food Insecurity Present (05/19/2024)  Housing: Unknown (05/19/2024)  Transportation Needs: Unmet Transportation Needs (05/19/2024)  Utilities: Not At Risk (05/19/2024)  Financial Resource Strain: Medium Risk (09/17/2023)  Physical Activity: Not on File (08/30/2023)   Received from [facility]  Social Connections: Not on File (08/30/2023)   [facility] from [facility]  Stress: Not on File (08/30/2023)   Received from [facility]   Tobacco Use: High Risk (05/18/2024)   SDOH Interventions: Housing Interventions: Intervention Not Indicated   Readmission Risk Interventions     No data to display

## 2024-05-19 NOTE — Progress Notes (Signed)
 PROGRESS NOTE    Richard [name]  XBJ:[id number] DOB: 03/14/1971 DOA: 05/18/2024 PCP: Davida Espy, MD    Brief Narrative:   Richard [name] is a 53 y.o. male with past medical history significant for chronic hypoxic respiratory failure/COPD on 4-5L O2, Stage IIIB NSCLC s/p palliative radiation, chemotherapy and immunotherapy stopped due to nonadherence with treatment, current smoker, hx substance use (cocaine), hx DVT completed AC, Hx TBI, prior hx ICH, seizure disorder, diastolic dysfunction, aortic aneurysm, HTN, mood d/o, who was seen in ED on 6/4 with chest pain and SOB, diagnosed with multifocal pneumonia and discharged with Rx Doxycycline ; although had not started. Returns with persistent chest pain and SOB. Reports having off and on chest pain, not sure when this initially started. However has been worse over the past 2 days. Describes sharp R sided and throbbing L sided chest pain. Worse with exertion and resolves with rest. Increase in SOB from his baseline chronic dyspnea. Cough with white sputum. Mild lower ext edema. Denies orthopnea. Chronically on 4-5 L o2 and reports having O2 at home. Currently smoking ~ 1/2 PPD.   In the ED, temperature 90.6 3 Fahrenheit, HR 95, RR 17, BP 137/104, SpO2 100% on 4 L nasal cannula.  WBC 7.5, hemoglobin 18.2, platelet count 246.  Sodium 139, potassium 4.0, chloride 107, CO2 16, glucose 90, BUN 10, creatinine 0.91.  AST 30, ALT 25, total bilirubin 0.9.  BNP 258.7.  High sensitive troponin 18>23>15.  UDS positive for cocaine.  Influenza A/B, COVID/RSV PCR negative.  D-dimer 0.90.  Chest x-ray with minimal left basilar atelectasis versus infiltrate.  CT angiogram chest with no pulmonary embolism, diffuse peribronchial micronodular pattern consistent with inflammatory versus infectious process, new 13 x 11 mm pleural-based nodule left lower lobe may represent rounded atelectasis, stable radiation changes right upper lobe/right hilum.  EKG personally reviewed with  sinus tachycardia, rate 101, no concerning ST elevation/depressions or T wave inversions.  Patient was started on azithromycin  and ceftriaxone .  TRH consulted for admission for further evaluation management of multifocal pneumonia versus CHF exacerbation; chest pain in setting of active cocaine abuse.  Assessment & Plan:   Community acquired pneumonia  Acute on chronic hypoxic respiratory failure, home 4-5L O2  Hx acute onset 2-3 days of chest pain and SOB, productive cough. Desat to mid 80s with ambulation on 4L. WBC 6.  Influenza/COVID/RSV PCR negative.  CTA from 6/4 with diffuse peribronchial micronodular infiltrate. CXR today with R hilar and perihilar airspace disease, small R pleural effusion. Suspect multifocal / atypical pneumonia pattern, however should have repeat imaging with his hx of malignancy.  -- Continue ceftriaxone  1 g IV every 24 hours -- azithromycin  500 mg daily -- sputum culture pending -- Continue home Trelegy Ellipta   -- DuoNebs every 6 hours scheduled -- albuterol  every 4 hours as needed,  -- incentive spirometer, flutter valve -- Recommend repeat CT chest in 6 to 8 weeks to assess for resolution  Chronic chronic diastolic congestive heart failure Noted on prior TTE, BNP 251.  -- Repeat TTE: Pending -- Start furosemide  40 mg IV every 12 hours -- Strict I's and O's and daily weights   Acute myocardial injury  Exertional chest pain  Hx exertional chest pain, intermittent worse in past 2 days, exertional and relieved with rest. EKG with no acute ischemic changes. Suspect may have anginal pain and demand ischemia related to hypoxia from CAP and AoCHRF per above.  UDS positive for cocaine. -- Hs Troponin 18>23>19>15 -- Aspirin  81 mg  p.o. daily -- TTE: Pending   Substance use, + Cocaine  Active smoking  Counseled on smoking, cocaine cessation; Denies any active cocaine / crack use despite result  -- TOC consult for substance use resources -- Nicotine  7 mg patch  with gum as needed   Stage IIIB NSCLC:  Follows with Dr. Liam Redhead. s/p palliative radiation, chemotherapy and immunotherapy stopped due to nonadherence with treatment, currently on active surveillance   Hx DVT:  2016, completed 6 months anticoagulation   Hx TBI, L frontal encephalomalacia, hx prior ICH: Noted   Seizure disorder: continue home Keppra    History aortic aneurysm: Outpatient surveillance  Hypertension: Continue home amlodipine , clonidine  3 times daily, losartan .  Mood disorder: Continue home sertraline .  Erythrocytosis: Hb 18. Suspect related to chronic hypoxia    DVT prophylaxis: Lovenox     Code Status: Full Code Family Communication: No family at bedside this morning  Disposition Plan:  Level of care: Telemetry Medical Status is: Inpatient Remains inpatient appropriate because: IV diuresis, IV antibiotics    Consultants:  None  Procedures:  TTE: Pending  Antimicrobials:  Azithromycin  6/5>> Ceftriaxone  6/5>>   Subjective: Patient seen examined bedside, lying in bed.  Continues to complain of shortness of breath, intermittent chest pain.  Discussed needs complete cessation regarding cocaine, denied use.  Discussed with him that his UDS was positive and typically cocaine lasts roughly 3 days in a urine sample.  Continues on IV antibiotics, discussed elevated BNP likely related to some mild volume overload likely complicating picture and starting IV diuresis today.  Patient with no other specific questions or concerns or complaints at this time other than requesting "liquid pain medication" because he does not like pills.  Denies headache, no vision changes, no fever/chills/night sweats, no nausea/vomiting/diarrhea, no focal weakness, no fatigue, no paresthesias.  No acute events overnight per nursing staff.  Objective: Vitals:   05/19/24 0254 05/19/24 0626 05/19/24 0734 05/19/24 0834  BP: (!) 129/97 119/74 (!) 141/109   Pulse: 94 80 87 86  Resp: 18 16 17  19   Temp: 98.5 F (36.9 C) 98.2 F (36.8 C) 98.1 F (36.7 C)   TempSrc:   Oral   SpO2: 100% 96% (!) 89% 92%  Weight:      Height:        Intake/Output Summary (Last 24 hours) at 05/19/2024 1607 Last data filed at 05/19/2024 0418 Gross per 24 hour  Intake 350.05 ml  Output --  Net 350.05 ml   Filed Weights   05/18/24 2303  Weight: 102 kg    Examination:  Physical Exam: GEN: NAD, alert and oriented x 3, chronically ill appearance, appears older than stated age HEENT: NCAT, PERRL, EOMI, sclera clear, MMM PULM: Breath sounds diminished bilateral bases with crackles, no wheezing, normal respiratory effort without accessory muscle use, on 4 L nasal cannula with SpO2 92% at rest (baseline 4-5L) CV: RRR w/o M/G/R GI: abd soft, NTND, NABS, no R/G/M MSK: Trace bilateral lower extremity peripheral edema, muscle tremors independently NEURO: CN II-XII intact, no focal deficits, sensation to light touch intact PSYCH: normal mood/affect Integumentary: dry/intact, no rashes or wounds    Data Reviewed: I have personally reviewed following labs and imaging studies  CBC: Recent Labs  Lab 05/18/24 0046 05/18/24 2324 05/19/24 0619  WBC 7.5 6.2 6.9  NEUTROABS 6.4  --   --   HGB 18.2* 18.2* 16.4  HCT 52.5* 55.9* 49.2  MCV 97.8 97.6 97.0  PLT 246 268 254   Basic Metabolic Panel:  Recent Labs  Lab 05/18/24 0046 05/18/24 2324 05/19/24 0619  NA 139 144 141  K 4.0 3.9 4.1  CL 107 107 110  CO2 16* 26 23  GLUCOSE 90 98 94  BUN 10 9 11   CREATININE 0.91 1.17 0.84  CALCIUM 8.8* 9.0 8.4*  MG  --   --  1.8  PHOS  --   --  3.7   GFR: Estimated Creatinine Clearance: 119.7 mL/min (by C-G formula based on SCr of 0.84 mg/dL). Liver Function Tests: Recent Labs  Lab 05/18/24 0046  AST 30  ALT 25  ALKPHOS 68  BILITOT 0.9  PROT 6.9  ALBUMIN 3.6   No results for input(s): "LIPASE", "AMYLASE" in the last 168 hours. No results for input(s): "AMMONIA" in the last 168  hours. Coagulation Profile: No results for input(s): "INR", "PROTIME" in the last 168 hours. Cardiac Enzymes: No results for input(s): "CKTOTAL", "CKMB", "CKMBINDEX", "TROPONINI" in the last 168 hours. BNP (last 3 results) No results for input(s): "PROBNP" in the last 8760 hours. HbA1C: Recent Labs    05/19/24 0619  HGBA1C 6.0*   CBG: No results for input(s): "GLUCAP" in the last 168 hours. Lipid Profile: Recent Labs    05/19/24 0619  CHOL 107  HDL 56  LDLCALC 44  TRIG 36  CHOLHDL 1.9   Thyroid  Function Tests: No results for input(s): "TSH", "T4TOTAL", "FREET4", "T3FREE", "THYROIDAB" in the last 72 hours. Anemia Panel: No results for input(s): "VITAMINB12", "FOLATE", "FERRITIN", "TIBC", "IRON", "RETICCTPCT" in the last 72 hours. Sepsis Labs: Recent Labs  Lab 05/19/24 0032 05/19/24 0619  PROCALCITON  --  <0.10  LATICACIDVEN 1.6  --     Recent Results (from the past 240 hours)  Blood culture (routine x 2)     Status: None (Preliminary result)   Collection Time: 05/18/24 12:05 AM   Specimen: BLOOD LEFT FOREARM  Result Value Ref Range Status   Specimen Description BLOOD LEFT FOREARM  Final   Special Requests   Final    BOTTLES DRAWN AEROBIC AND ANAEROBIC Blood Culture results may not be optimal due to an inadequate volume of blood received in culture bottles   Culture   Final    NO GROWTH < 12 HOURS Performed at [facility], [address] [place], [place] [address]    Report Status PENDING  Incomplete  Blood culture (routine x 2)     Status: None (Preliminary result)   Collection Time: 05/19/24 12:00 AM   Specimen: BLOOD  Result Value Ref Range Status   Specimen Description BLOOD LEFT ANTECUBITAL  Final   Special Requests   Final    BOTTLES DRAWN AEROBIC AND ANAEROBIC Blood Culture adequate volume   Culture   Final    NO GROWTH < 12 HOURS Performed at [facility], [address], [place], [place] [address]    Report Status PENDING  Incomplete   Resp panel by RT-PCR (RSV, Flu A&B, Covid) Anterior Nasal Swab     Status: None   Collection Time: 05/19/24  1:50 AM   Specimen: Anterior Nasal Swab  Result Value Ref Range Status   SARS Coronavirus 2 by RT PCR NEGATIVE NEGATIVE Final   Influenza A by PCR NEGATIVE NEGATIVE Final   Influenza B by PCR NEGATIVE NEGATIVE Final    Comment: (NOTE) The Xpert Xpress SARS-CoV-2/FLU/RSV plus assay is intended as an aid in the diagnosis of influenza from Nasopharyngeal swab specimens and should not be used as a sole basis  for treatment. Nasal washings and aspirates are unacceptable for Xpert Xpress SARS-CoV-2/FLU/RSV testing.  Fact Sheet for Patients: BloggerCourse.com  Fact Sheet for Healthcare Providers: SeriousBroker.it  This test is not yet approved or cleared by the [place]  FDA and has been authorized for detection and/or diagnosis of SARS-CoV-2 by FDA under an Emergency Use Authorization (EUA). This EUA will remain in effect (meaning this test can be used) for the duration of the COVID-19 declaration under Section 564(b)(1) of the Act, 21 U.S.C. section 360bbb-3(b)(1), unless the authorization is terminated or revoked.     Resp Syncytial Virus by PCR NEGATIVE NEGATIVE Final    Comment: (NOTE) Fact Sheet for Patients: BloggerCourse.com  Fact Sheet for Healthcare Providers: SeriousBroker.it  This test is not yet approved or cleared by the [place]  FDA and has been authorized for detection and/or diagnosis of SARS-CoV-2 by FDA under an Emergency Use Authorization (EUA). This EUA will remain in effect (meaning this test can be used) for the duration of the COVID-19 declaration under Section 564(b)(1) of the Act, 21 U.S.C. section 360bbb-3(b)(1), unless the authorization is terminated or revoked.  Performed at [facility], [address], [place],  [place] [address]          Radiology Studies: DG Chest 2 View Result Date: 05/18/2024 CLINICAL DATA:  Chest pain EXAM: CHEST - 2 VIEW COMPARISON:  Chest x-ray 05/18/2024.  CT of the chest 05/18/2024. FINDINGS: The right hilum appears enlarged and there is right perihilar airspace opacities similar to the prior study. Scarring in the right upper lobe is again noted. Scattered interstitial opacities in the left lower lung are unchanged. There is a small right pleural effusion. There is no pneumothorax. The cardiac silhouette is within normal limits. No acute fractures are seen. IMPRESSION: 1. Stable enlarged right hilum with right perihilar airspace opacities. 2. Small right pleural effusion. Electronically Signed   By: Tyron Gallon M.D.   On: 05/18/2024 23:22   CT Angio Chest PE W and/or Wo Contrast Result Date: 05/18/2024 EXAM: CTA of the Chest with contrast for PE 05/18/2024 04:17:28 AM TECHNIQUE: CTA of the chest was performed after the administration of intravenous contrast, with and without IV contrast. Multiplanar reformatted images are provided for review. MIP images are provided for review. Automated exposure control, iterative reconstruction, and/or weight based adjustment of the mA/kV was utilized to reduce the radiation dose to as low as reasonably achievable. 75mL of ioheixol (OMNIPAQUE ) 350 MG/ML injection was used. COMPARISON: Comparison is made to 1 view chest x-ray on 05/18/2024 and CT chest with contrast on 12/17/2023. CLINICAL HISTORY: Pulmonary embolism (PE) suspected, low to intermediate probability, positive D-dimer. The patient has a personal history of non-small cell lung cancer. The patient was out walking and was smoking a cigarette when he started to have increasing shortness of breath [facility]). The patient wears 5L nasal cannula ([place]) at baseline but was not wearing his oxygen  on his walk. The patient reports having chest pain, leg swelling, and SHOB for a year. FINDINGS: PULMONARY  ARTERIES: Pulmonary arteries are adequately opacified for evaluation. No pulmonary embolism. Main pulmonary artery is normal in caliber. MEDIASTINUM: The heart and pericardium demonstrate no acute abnormality. There is no acute abnormality of the thoracic aorta. LYMPH NODES: No mediastinal, hilar or axillary lymphadenopathy. LUNGS AND PLEURA: The lungs show centrilobular emphysema. Post treatment changes in the right upper lobe and right hilum are stable. Diffuse peribronchial micronodularity is now present. A new pleural-based nodule is present in the posteromedial  left lower lobe that measures 13 x 11 mm. No pleural effusion or pneumothorax. UPPER ABDOMEN: A left adrenal nodule is stable. SOFT TISSUES AND BONES: No acute bone or soft tissue abnormality. IMPRESSION: 1. No pulmonary embolism. 2. Diffuse peribronchial micronodular pattern reflects an acute inflammatory or infectious process. 3. New 13 x 11 mm pleural-based nodule in the posteromedial left lower lobe. This may represent rounded atelectasis. 4. Stable post treatment changes in the right upper lobe and right hilum. Electronically signed by: Audree Leas MD 05/18/2024 04:35 AM EDT RP Workstation: [id number]   DG Chest Portable 1 View Result Date: 05/18/2024 CLINICAL DATA:  Dyspnea EXAM: PORTABLE CHEST 1 VIEW COMPARISON:  None Available. FINDINGS: Masslike consolidation within the right hilum reflects post radiation changes better appreciated on CT examination of 12/17/2023. Parenchymal scarring within the right mid lung zone. Minimal left basilar atelectasis or infiltrate. Lungs are otherwise clear. No pneumothorax or pleural effusion. Cardiac size is at the upper limits of normal. Pulmonary vascularity is normal. No acute bone. IMPRESSION: 1. Minimal left basilar atelectasis or infiltrate. 2. Post radiation changes within the right hilum. Electronically Signed   By: Worthy Heads M.D.   On: 05/18/2024 01:15        Scheduled Meds:   amLODipine   10 mg Oral q morning   aspirin   324 mg Oral Once   Followed by   Cecily Cohen ON 05/20/2024] aspirin   81 mg Oral Daily   [START ON 05/20/2024] azithromycin   500 mg Oral Daily   budesonide-glycopyrrolate-formoterol   2 puff Inhalation BID   cloNIDine   0.2 mg Oral BID   And   cloNIDine   0.1 mg Oral QHS   enoxaparin  (LOVENOX ) injection  50 mg Subcutaneous Q24H   furosemide   40 mg Intravenous BID   hydrochlorothiazide   12.5 mg Oral q morning   ipratropium-albuterol   3 mL Nebulization Q6H   levETIRAcetam   500 mg Oral BID   losartan   100 mg Oral Daily   nicotine   7 mg Transdermal Daily   sertraline   25 mg Oral Daily   sodium chloride  flush  3 mL Intravenous Q12H   Continuous Infusions:  [START ON 05/20/2024] cefTRIAXone  (ROCEPHIN )  IV       LOS: 0 days    Time spent: 52 minutes spent on 05/19/2024 caring for this patient face-to-face including chart review, ordering labs/tests, documenting, discussion with nursing staff, consultants, updating family and interview/physical exam    Rema Care [place], DO [facility] Available via Epic secure chat 7am-7pm After these hours, please refer to coverage provider listed on [email or web address] 05/19/2024, 4:07 PM

## 2024-05-19 NOTE — Progress Notes (Signed)
   05/19/24 1125  OTHER  Substance Abuse Education Offered Yes  Substance abuse interventions Educational Materials  (CAGE-AID) Substance Abuse Screening Tool  Have You Ever Felt You Ought to Cut Down on Your Drinking or Drug Use? 0  Have People Annoyed You By Critizing Your Drinking Or Drug Use? 0  Have You Felt Bad Or Guilty About Your Drinking Or Drug Use? 0  Have You Ever Had a Drink or Used Drugs First Thing In The Morning to Steady Your Nerves or to Get Rid of a Hangover? 0  CAGE-AID Score 0   Patient states he does not use polysubstance (see labs) . However, patient did want resources on same. Resources left at bedside with patient

## 2024-05-19 NOTE — H&P (Addendum)
 History and Physical    Richard [name] ZOX:[id number] DOB: 16-Feb-1971 DOA: 05/18/2024  PCP: System, Provider Not In   Patient coming from: Home   Chief Complaint:  Chief Complaint  Patient presents with   Chest Pain   Shortness of Breath    HPI: History limited as he is vague historian  Richard [name] is a 53 y.o. male with hx of COPD with CHRF on 4-5L O2, Stage IIIB NSCLC s/p palliative radiation, chemotherapy and immunotherapy stopped due to nonadherence with treatment, current smoker, hx substance use (cocaine), hx DVT completed AC, Hx TBI, prior hx ICH, seizure disorder, diastolic dysfunction, aortic aneurysm, HTN, mood d/o, who was seen in ED on 6/4 with chest pain and SOB, diagnosed with multifocal pneumonia and discharged with Rx Doxycycline  although had not started. Returns with persistent chest pain and SOB. Reports having off and on chest pain, not sure when this initially started. However has been worse over the past 2 days. Describes sharp R sided and throbbing L sided chest pain. Worse with exertion and resolves with rest. Increase in SOB from his baseline chronic dyspnea. Cough with white sputum. Mild lower ext edema. Denies orthopnea. Chronically on 4-5 L o2 and reports having O2 at home. Currently smoking ~ 1/2 PPD.    Review of Systems:  ROS complete and negative except as marked above   Allergies  Allergen Reactions   Dilaudid  [Hydromorphone  Hcl] Nausea Only and Other (See Comments)    Bradycardia and hyperthermia, too   Tape Itching and Dermatitis    Prior to Admission medications   Medication Sig Start Date End Date Taking? Authorizing Provider  albuterol  (PROVENTIL ) (2.5 MG/3ML) 0.083% nebulizer solution Take 2.5 mg by nebulization 3 (three) times daily as needed for shortness of breath or wheezing. 05/11/24   [provider]  albuterol  (VENTOLIN  HFA) 108 (90 Base) MCG/ACT inhaler Inhale 2 puffs into the lungs every 6 (six) hours as needed for wheezing or  shortness of breath. 09/24/23   Hunsucker, Archer Kobs, MD  amLODipine  (NORVASC ) 10 MG tablet Take 10 mg by mouth every morning. 08/06/23   [provider]  Aspirin -Acetaminophen  (GOODYS BODY PAIN PO) Take 1 packet by mouth daily as needed (for pain).    [provider]  cloNIDine  (CATAPRES ) 0.1 MG tablet Take 0.1 mg by mouth. Take 2 tablets by mouth at 9am and 5pm and 1 tablet at bedtime    [provider]  doxycycline  (VIBRAMYCIN ) 100 MG capsule Take 1 capsule (100 mg total) by mouth 2 (two) times daily. One po bid x 7 days 05/18/24   Mesner, Reymundo Caulk, MD  Fluticasone -Umeclidin-Vilant (TRELEGY ELLIPTA ) 200-62.5-25 MCG/ACT AEPB Inhale 1 puff into the lungs daily. 09/24/23   Hunsucker, Archer Kobs, MD  hydrochlorothiazide  (HYDRODIURIL ) 12.5 MG tablet Take 12.5 mg by mouth every morning. 08/06/23   [provider]  levETIRAcetam  (KEPPRA ) 500 MG tablet Take 1 tablet (500 mg total) by mouth 2 (two) times daily. 01/17/23   Adria Hopkins, MD  losartan  (COZAAR ) 100 MG tablet Take 100 mg by mouth daily. 12/31/22   [provider]  sertraline  (ZOLOFT ) 25 MG tablet Take 25 mg by mouth daily. 07/23/23   [provider]    Past Medical History:  Diagnosis Date   Asthma    Brain bleed (HCC)    DVT (deep venous thrombosis) (HCC) 02/19/2015   RLE   GERD (gastroesophageal reflux disease)    History of home oxygen  therapy    Hypertension    Seizures (  HCC)    last episode 03/2013   Stroke [facility])     Past Surgical History:  Procedure Laterality Date   BRONCHIAL BIOPSY  02/10/2022   Procedure: BRONCHIAL BIOPSIES;  Surgeon: Lind Repine, MD;  Location: [facility];  Service: Cardiopulmonary;;   BRONCHIAL BRUSHINGS  02/10/2022   Procedure: BRONCHIAL BRUSHINGS;  Surgeon: Lind Repine, MD;  Location: [facility];  Service: Cardiopulmonary;;   BRONCHIAL NEEDLE ASPIRATION BIOPSY  02/10/2022   Procedure: BRONCHIAL NEEDLE ASPIRATION BIOPSIES;  Surgeon: Lind Repine,  MD;  Location: [facility];  Service: Cardiopulmonary;;   BRONCHIAL WASHINGS  02/10/2022   Procedure: BRONCHIAL WASHINGS;  Surgeon: Lind Repine, MD;  Location: [facility];  Service: Cardiopulmonary;;   ENDOBRONCHIAL ULTRASOUND Bilateral 02/10/2022   Procedure: ENDOBRONCHIAL ULTRASOUND;  Surgeon: Lind Repine, MD;  Location: [facility];  Service: Cardiopulmonary;  Laterality: Bilateral;   LEG SURGERY     VIDEO BRONCHOSCOPY  02/10/2022   Procedure: VIDEO BRONCHOSCOPY WITHOUT FLUORO;  Surgeon: Lind Repine, MD;  Location: [facility];  Service: Cardiopulmonary;;     reports that he has been smoking cigarettes. He started smoking about 38 years ago. He has a 38.4 pack-year smoking history. He has never used smokeless tobacco. He reports current alcohol use. He reports that he does not currently use drugs after having used the following drugs: Cocaine.  Family History  Problem Relation Age of Onset   Cancer Father    Diabetes Mellitus II Sister      Physical Exam: Vitals:   05/18/24 2301 05/18/24 2303  BP: (!) 137/104   Pulse: 95   Resp: 17   Temp: 98.6 F (37 C)   TempSrc: Oral   SpO2: 100%   Weight:  102 kg  Height:  5\' 9"  (1.753 m)    Gen: Awake, alert, Chronically ill epparing   CV: Regular, normal S1, loud S2, no murmurs  Resp: Normal WOB, on [place]. Slight diminished air movement. Coarse breath sounds and diffuse referred bronchial sounds.  Abd: Flat, normoactive, nontender MSK: bony mass at the R anterior shin (states chronic). Symmetric, with trace edema at ankles.   Skin: acanthosis nigricans. No other rashes or lesions to exposed skin  Neuro: Alert and interactive  Psych: euthymic, appropriate    Data review:   Labs reviewed, notable for:   BNP 251 up from prior  HS trop 19  WBC 6  Hb 18/ hct 55.   Micro:  Results for orders placed or performed during the hospital encounter of 09/16/23  SARS Coronavirus 2 by RT PCR (hospital order, performed in [facility] hospital lab) *cepheid single result test* Anterior Nasal Swab     Status: None   Collection Time: 09/16/23  5:50 PM   Specimen: Anterior Nasal Swab  Result Value Ref Range Status   SARS Coronavirus 2 by RT PCR NEGATIVE NEGATIVE Final    Comment: Performed at [facility], [address] [place], [place] [address]  Culture, blood (Routine X 2) w Reflex to ID Panel     Status: None   Collection Time: 09/16/23 10:57 PM   Specimen: BLOOD  Result Value Ref Range Status   Specimen Description BLOOD LEFT ANTECUBITAL  Final   Special Requests   Final    BOTTLES DRAWN AEROBIC ONLY Blood Culture adequate volume   Culture   Final    NO GROWTH 5 DAYS Performed at [facility], [address], [place], [place] [address]  Report Status 09/21/2023 FINAL  Final  Culture, blood (Routine X 2) w Reflex to ID Panel     Status: None   Collection Time: 09/16/23 10:57 PM   Specimen: BLOOD  Result Value Ref Range Status   Specimen Description BLOOD LEFT ANTECUBITAL  Final   Special Requests   Final    BOTTLES DRAWN AEROBIC ONLY Blood Culture adequate volume   Culture   Final    NO GROWTH 5 DAYS Performed at [facility], [address] [place], [place] [address]    Report Status 09/21/2023 FINAL  Final    Imaging reviewed:  DG Chest 2 View Result Date: 05/18/2024 CLINICAL DATA:  Chest pain EXAM: CHEST - 2 VIEW COMPARISON:  Chest x-ray 05/18/2024.  CT of the chest 05/18/2024. FINDINGS: The right hilum appears enlarged and there is right perihilar airspace opacities similar to the prior study. Scarring in the right upper lobe is again noted. Scattered interstitial opacities in the left lower lung are unchanged. There is a small right pleural effusion. There is no pneumothorax. The cardiac silhouette is within normal limits. No acute fractures are seen. IMPRESSION: 1. Stable enlarged right hilum with right perihilar airspace opacities. 2. Small right pleural effusion.  Electronically Signed   By: Tyron Gallon M.D.   On: 05/18/2024 23:22   CT Angio Chest PE W and/or Wo Contrast Result Date: 05/18/2024 EXAM: CTA of the Chest with contrast for PE 05/18/2024 04:17:28 AM TECHNIQUE: CTA of the chest was performed after the administration of intravenous contrast, with and without IV contrast. Multiplanar reformatted images are provided for review. MIP images are provided for review. Automated exposure control, iterative reconstruction, and/or weight based adjustment of the mA/kV was utilized to reduce the radiation dose to as low as reasonably achievable. 75mL of ioheixol (OMNIPAQUE ) 350 MG/ML injection was used. COMPARISON: Comparison is made to 1 view chest x-ray on 05/18/2024 and CT chest with contrast on 12/17/2023. CLINICAL HISTORY: Pulmonary embolism (PE) suspected, low to intermediate probability, positive D-dimer. The patient has a personal history of non-small cell lung cancer. The patient was out walking and was smoking a cigarette when he started to have increasing shortness of breath [facility]). The patient wears 5L nasal cannula ([place]) at baseline but was not wearing his oxygen  on his walk. The patient reports having chest pain, leg swelling, and SHOB for a year. FINDINGS: PULMONARY ARTERIES: Pulmonary arteries are adequately opacified for evaluation. No pulmonary embolism. Main pulmonary artery is normal in caliber. MEDIASTINUM: The heart and pericardium demonstrate no acute abnormality. There is no acute abnormality of the thoracic aorta. LYMPH NODES: No mediastinal, hilar or axillary lymphadenopathy. LUNGS AND PLEURA: The lungs show centrilobular emphysema. Post treatment changes in the right upper lobe and right hilum are stable. Diffuse peribronchial micronodularity is now present. A new pleural-based nodule is present in the posteromedial left lower lobe that measures 13 x 11 mm. No pleural effusion or pneumothorax. UPPER ABDOMEN: A left adrenal nodule is stable. SOFT  TISSUES AND BONES: No acute bone or soft tissue abnormality. IMPRESSION: 1. No pulmonary embolism. 2. Diffuse peribronchial micronodular pattern reflects an acute inflammatory or infectious process. 3. New 13 x 11 mm pleural-based nodule in the posteromedial left lower lobe. This may represent rounded atelectasis. 4. Stable post treatment changes in the right upper lobe and right hilum. Electronically signed by: Audree Leas MD 05/18/2024 04:35 AM EDT RP Workstation: [id number]   DG Chest Portable 1 View Result Date: 05/18/2024 CLINICAL DATA:  Dyspnea EXAM: PORTABLE CHEST 1 VIEW COMPARISON:  None Available. FINDINGS: Masslike consolidation within the right hilum reflects post radiation changes better appreciated on CT examination of 12/17/2023. Parenchymal scarring within the right mid lung zone. Minimal left basilar atelectasis or infiltrate. Lungs are otherwise clear. No pneumothorax or pleural effusion. Cardiac size is at the upper limits of normal. Pulmonary vascularity is normal. No acute bone. IMPRESSION: 1. Minimal left basilar atelectasis or infiltrate. 2. Post radiation changes within the right hilum. Electronically Signed   By: Worthy Heads M.D.   On: 05/18/2024 01:15    EKG:  Personally reviewed SR, LAE, PRWP, no acute ischemic changes.   ED Course:   Treated with CTX, azithromycin , nebs.    Assessment/Plan:  53 y.o. male with hx COPD with CHRF on 4-5L O2, Stage IIIB NSCLC s/p palliative radiation, chemotherapy and immunotherapy stopped due to nonadherence with treatment, current smoker, hx substance use (cocaine), hx DVT completed AC, Hx TBI, prior hx ICH, seizure disorder, diastolic dysfunction, aortic aneurysm, HTN, mood d/o, recent ED visit with SOB / CP, Dx with CAP and Rx Doxycycline  but had not started, who returns with persistent symptoms and admitted for CAP with acute on chronic hypoxic respiratory failure   Community acquired pneumonia  Acute on chronic hypoxic  respiratory failure, home 4-5L O2  Hx acute onset 2-3 days of chest pain and SOB, productive cough. Desat to mid 80s with ambulation on 4L. WBC 6. CTA from 6/4 with diffuse peribronchial micronodular infiltrate. CXR today with R hilar and perihilar airspace disease, small R pleural effusion. Suspect multifocal / atypical pneumonia pattern, however should have repeat imaging with his hx of malignancy.  -Continue ceftriaxone  1 g IV every 24 hours, azithromycin  500 mg daily - Check flu/COVID/RSV, sputum culture - Continue home Trelegy Ellipta  equivalent DuoNebs every 6 hours scheduled, albuterol  every 4 hours as needed, incentive spirometer, flutter valve, encourage out of bed to chair. - Home O2 evaluation prior to discharge - Consider referral to pulmonary rehab - Recommend repeat CT chest in 6 to 8 weeks to assess for resolution  Acute myocardial injury  Exertional chest pain  Hx exertional chest pain, intermittent worse in past 2 days, exertional and relieved with rest. EKG with no acute ischemic changes. Suspect may have anginal pain and demand ischemia related to hypoxia from CAP and AoCHRF per above.  - Consider routine cardiology consult in the morning, not contacted overnight. Anticipate he needs close cardiology f/u OP for further ischemic evaluation although his adherence with f/u and substance use makes higher risk for cardiac intervention.  - Trend troponin - load with aspirin , and continue 81 mg daily for now - Check lipids and A1c - Check echo  Substance use, + Cocaine  Active smoking  -Counseled on smoking, cocaine cessation; Denies any active cocaine / crack use despite result  -TOC consult for substance use resources -NRT 7 mg patch with gum as needed, Rx at discharge  Chronic medical problems:  Stage IIIB NSCLC: Follows with Dr. Liam Redhead. s/p palliative radiation, chemotherapy and immunotherapy stopped due to nonadherence with treatment, currently on active surveillance  Hx  DVT: in '16, completed 6 months anticoagulation  Hx TBI, L frontal encephalomalacia, hx prior ICH: Noted  Seizure disorder: continue home Keppra   Diastolic dysfunction: Noted on prior TTE, BNP 251 slightly up from prior.  Reassess with echo.  Holding off on diuresis for now as appears infiltrates more related to pneumonia. History aortic aneurysm: Outpatient surveillance Hypertension: Continue home amlodipine ,  clonidine  3 times daily, losartan . Mood disorder: Continue home sertraline . Erythrocytosis: Hb 18. Suspect related to chronic hypoxia   Body mass index is 33.21 kg/m. Obesity class I    DVT prophylaxis:  Lovenox  Code Status:  Full Code Diet:  Diet Orders (From admission, onward)     Start     Ordered   05/19/24 0117  Diet regular Room service appropriate? Yes; Fluid consistency: Thin  Diet effective now       Question Answer Comment  Room service appropriate? Yes   Fluid consistency: Thin      05/19/24 0120           Family Communication:  None   Consults:  None   Admission status:   Observation, Telemetry bed  Severity of Illness: The appropriate patient status for this patient is OBSERVATION. Observation status is judged to be reasonable and necessary in order to provide the required intensity of service to ensure the patient's safety. The patient's presenting symptoms, physical exam findings, and initial radiographic and laboratory data in the context of their medical condition is felt to place them at decreased risk for further clinical deterioration. Furthermore, it is anticipated that the patient will be medically stable for discharge from the hospital within 2 midnights of admission.    Richard Larch, MD [facility]  How to contact the [facility] Attending or Consulting provider 7A - 7P or covering provider during after hours 7P -7A, for this patient.  Check the care team in [facility] and look for a) attending/consulting [facility] provider listed and b) the [facility] team  listed Log into [email or web address] and use [place]'s universal password to access. If you do not have the password, please contact the hospital operator. Locate the [facility] provider you are looking for under [facility] and page to a number that you can be directly reached. If you still have difficulty reaching the provider, please page the [facility] (Director on Call) for the Hospitalists listed on amion for assistance.  05/19/2024, 1:35 AM

## 2024-05-20 DIAGNOSIS — I5033 Acute on chronic diastolic (congestive) heart failure: Secondary | ICD-10-CM | POA: Diagnosis not present

## 2024-05-20 DIAGNOSIS — J189 Pneumonia, unspecified organism: Secondary | ICD-10-CM | POA: Diagnosis not present

## 2024-05-20 LAB — BRAIN NATRIURETIC PEPTIDE: B Natriuretic Peptide: 258 pg/mL — ABNORMAL HIGH (ref 0.0–100.0)

## 2024-05-20 LAB — BASIC METABOLIC PANEL WITH GFR
Anion gap: 10 (ref 5–15)
BUN: 7 mg/dL (ref 6–20)
CO2: 22 mmol/L (ref 22–32)
Calcium: 8.6 mg/dL — ABNORMAL LOW (ref 8.9–10.3)
Chloride: 110 mmol/L (ref 98–111)
Creatinine, Ser: 0.86 mg/dL (ref 0.61–1.24)
GFR, Estimated: >60 mL/min (ref >60)
Glucose, Bld: 105 mg/dL — ABNORMAL HIGH (ref 70–99)
Potassium: 4.1 mmol/L (ref 3.5–5.1)
Sodium: 142 mmol/L (ref 135–145)

## 2024-05-20 LAB — HIV ANTIBODY (ROUTINE TESTING W REFLEX): HIV Screen 4th Generation wRfx: NONREACTIVE

## 2024-05-20 MED ORDER — IPRATROPIUM-ALBUTEROL 0.5-2.5 (3) MG/3ML IN SOLN
3.0000 mL | Freq: Two times a day (BID) | RESPIRATORY_TRACT | Status: DC
  Administered 2024-05-20 – 2024-05-21 (×2): 3 mL via RESPIRATORY_TRACT
  Filled 2024-05-20 (×2): qty 3

## 2024-05-20 MED ORDER — FUROSEMIDE 10 MG/ML IJ SOLN
60.0000 mg | Freq: Two times a day (BID) | INTRAMUSCULAR | Status: DC
  Administered 2024-05-20 – 2024-05-22 (×4): 60 mg via INTRAVENOUS
  Filled 2024-05-20 (×4): qty 6

## 2024-05-20 NOTE — Progress Notes (Signed)
 PROGRESS NOTE    Richard [name]  UEA:[id number] DOB: 07-29-1971 DOA: 05/18/2024 PCP: Davida Espy, MD    Brief Narrative:   Richard [name] is a 53 y.o. male with past medical history significant for chronic hypoxic respiratory failure/COPD on 4-5L O2, Stage IIIB NSCLC s/p palliative radiation, chemotherapy and immunotherapy stopped due to nonadherence with treatment, current smoker, hx substance use (cocaine), hx DVT completed AC, Hx TBI, prior hx ICH, seizure disorder, diastolic dysfunction, aortic aneurysm, HTN, mood d/o, who was seen in ED on 6/4 with chest pain and SOB, diagnosed with multifocal pneumonia and discharged with Rx Doxycycline ; although had not started. Returns with persistent chest pain and SOB. Reports having off and on chest pain, not sure when this initially started. However has been worse over the past 2 days. Describes sharp R sided and throbbing L sided chest pain. Worse with exertion and resolves with rest. Increase in SOB from his baseline chronic dyspnea. Cough with white sputum. Mild lower ext edema. Denies orthopnea. Chronically on 4-5 L o2 and reports having O2 at home. Currently smoking ~ 1/2 PPD.   In the ED, temperature 90.6 3 Fahrenheit, HR 95, RR 17, BP 137/104, SpO2 100% on 4 L nasal cannula.  WBC 7.5, hemoglobin 18.2, platelet count 246.  Sodium 139, potassium 4.0, chloride 107, CO2 16, glucose 90, BUN 10, creatinine 0.91.  AST 30, ALT 25, total bilirubin 0.9.  BNP 258.7.  High sensitive troponin 18>23>15.  UDS positive for cocaine.  Influenza A/B, COVID/RSV PCR negative.  D-dimer 0.90.  Chest x-ray with minimal left basilar atelectasis versus infiltrate.  CT angiogram chest with no pulmonary embolism, diffuse peribronchial micronodular pattern consistent with inflammatory versus infectious process, new 13 x 11 mm pleural-based nodule left lower lobe may represent rounded atelectasis, stable radiation changes right upper lobe/right hilum.  EKG personally reviewed with  sinus tachycardia, rate 101, no concerning ST elevation/depressions or T wave inversions.  Patient was started on azithromycin  and ceftriaxone .  TRH consulted for admission for further evaluation management of multifocal pneumonia versus CHF exacerbation; chest pain in setting of active cocaine abuse.  Assessment & Plan:   Community acquired pneumonia  Acute on chronic hypoxic respiratory failure, home 4-5L O2  Hx acute onset 2-3 days of chest pain and SOB, productive cough. Desat to mid 80s with ambulation on 4L. WBC 6.  Influenza/COVID/RSV PCR negative.  CTA from 6/4 with diffuse peribronchial micronodular infiltrate. CXR today with R hilar and perihilar airspace disease, small R pleural effusion. Suspect multifocal / atypical pneumonia pattern, however should have repeat imaging with his hx of malignancy.  -- Continue ceftriaxone  1 g IV every 24 hours -- Azithromycin  500 mg daily -- Breztri  2 puff BID (substituted for home Trelegy Ellipta ) -- DuoNebs every 6 hours scheduled -- albuterol  every 4 hours as needed,  -- incentive spirometer, flutter valve -- Recommend repeat CT chest in 6 to 8 weeks to assess for resolution  Chronic chronic diastolic congestive heart failure TTE with LVEF 55 to 96%, no LV RWMA, mild LVH, grade 1 diastolic dysfunction, no aortic stenosis. -- Furosemide  60 mg IV every 12 hours -- Strict I's and O's and daily weights   Acute myocardial injury  Exertional chest pain  Hx exertional chest pain, intermittent worse in past 2 days, exertional and relieved with rest. EKG with no acute ischemic changes. Suspect may have anginal pain and demand ischemia related to hypoxia from CAP and AoCHRF per above.  UDS positive for cocaine. -- Hs  Troponin 18>23>19>15 -- Aspirin  81 mg p.o. daily -- TTE: no regional wall motion abnormalities -- Monitor on telemetry -- Illicit substance abstinence   Substance use, + Cocaine  Active smoking  Counseled on smoking, cocaine cessation;  Denies any active cocaine / crack use despite result  -- TOC consult for substance use resources -- Nicotine  7 mg patch with gum as needed   Stage IIIB NSCLC:  Follows with Dr. Liam Redhead. s/p palliative radiation, chemotherapy and immunotherapy stopped due to nonadherence with treatment, currently on active surveillance   Hx DVT:  2016, completed 6 months anticoagulation   Hx TBI, L frontal encephalomalacia, hx prior ICH: Noted   Seizure disorder: continue home Keppra    History aortic aneurysm: Outpatient surveillance  Hypertension: Continue home amlodipine , clonidine  3 times daily, losartan .  Mood disorder: Continue home sertraline .  Erythrocytosis: Hb 18. Suspect related to chronic hypoxia    DVT prophylaxis: Lovenox     Code Status: Full Code Family Communication: No family at bedside this morning  Disposition Plan:  Level of care: Telemetry Medical Status is: Inpatient Remains inpatient appropriate because: IV diuresis, IV antibiotics    Consultants:  None  Procedures:  TTE:   Antimicrobials:  Azithromycin  6/5>> Ceftriaxone  6/5>>   Subjective: Patient seen examined bedside, sitting on edge of bed eating a large breakfast with a large bag of Cheetos as well.  Shortness of breath improved.  Denies chest pain this morning.  Continues on IV diuresis.  Patient with no other specific questions or concerns or complaints at this time.  Denies headache, no vision changes, no fever/chills/night sweats, no nausea/vomiting/diarrhea, no focal weakness, no fatigue, no paresthesias.  No acute events overnight per nursing staff.  Objective: Vitals:   05/20/24 0549 05/20/24 0821 05/20/24 0822 05/20/24 0836  BP: 111/70   (!) 121/94  Pulse: 82 87  92  Resp: 16 16  17   Temp: 98.3 F (36.8 C)   97.7 F (36.5 C)  TempSrc:    Oral  SpO2: 97% 97% 97% 100%  Weight:      Height:        Intake/Output Summary (Last 24 hours) at 05/20/2024 1157 Last data filed at 05/20/2024  0500 Gross per 24 hour  Intake 340 ml  Output 2700 ml  Net -2360 ml   Filed Weights   05/18/24 2303 05/20/24 0500  Weight: 102 kg 102 kg    Examination:  Physical Exam: GEN: NAD, alert and oriented x 3, chronically ill appearance, appears older than stated age HEENT: NCAT, PERRL, EOMI, sclera clear, MMM PULM: Breath sounds diminished bilateral bases with crackles, no wheezing, normal respiratory effort without accessory muscle use, on 4 L nasal cannula with SpO2 97% at rest (baseline 4-5L) CV: RRR w/o M/G/R GI: abd soft, NTND, NABS, no R/G/M MSK: Trace bilateral lower extremity peripheral edema, moves all extremities independently NEURO: CN II-XII intact, no focal deficits, sensation to light touch intact PSYCH: normal mood/affect Integumentary: dry/intact, no rashes or wounds    Data Reviewed: I have personally reviewed following labs and imaging studies  CBC: Recent Labs  Lab 05/18/24 0046 05/18/24 2324 05/19/24 0619  WBC 7.5 6.2 6.9  NEUTROABS 6.4  --   --   HGB 18.2* 18.2* 16.4  HCT 52.5* 55.9* 49.2  MCV 97.8 97.6 97.0  PLT 246 268 254   Basic Metabolic Panel: Recent Labs  Lab 05/18/24 0046 05/18/24 2324 05/19/24 0619 05/20/24 0538  NA 139 144 141 142  K 4.0 3.9 4.1 4.1  CL  107 107 110 110  CO2 16* 26 23 22   GLUCOSE 90 98 94 105*  BUN 10 9 11 7   CREATININE 0.91 1.17 0.84 0.86  CALCIUM 8.8* 9.0 8.4* 8.6*  MG  --   --  1.8  --   PHOS  --   --  3.7  --    GFR: Estimated Creatinine Clearance: 116.9 mL/min (by C-G formula based on SCr of 0.86 mg/dL). Liver Function Tests: Recent Labs  Lab 05/18/24 0046  AST 30  ALT 25  ALKPHOS 68  BILITOT 0.9  PROT 6.9  ALBUMIN 3.6   No results for input(s): "LIPASE", "AMYLASE" in the last 168 hours. No results for input(s): "AMMONIA" in the last 168 hours. Coagulation Profile: No results for input(s): "INR", "PROTIME" in the last 168 hours. Cardiac Enzymes: No results for input(s): "CKTOTAL", "CKMB",  "CKMBINDEX", "TROPONINI" in the last 168 hours. BNP (last 3 results) No results for input(s): "PROBNP" in the last 8760 hours. HbA1C: Recent Labs    05/19/24 0619  HGBA1C 6.0*   CBG: No results for input(s): "GLUCAP" in the last 168 hours. Lipid Profile: Recent Labs    05/19/24 0619  CHOL 107  HDL 56  LDLCALC 44  TRIG 36  CHOLHDL 1.9   Thyroid  Function Tests: No results for input(s): "TSH", "T4TOTAL", "FREET4", "T3FREE", "THYROIDAB" in the last 72 hours. Anemia Panel: No results for input(s): "VITAMINB12", "FOLATE", "FERRITIN", "TIBC", "IRON", "RETICCTPCT" in the last 72 hours. Sepsis Labs: Recent Labs  Lab 05/19/24 0032 05/19/24 0619  PROCALCITON  --  <0.10  LATICACIDVEN 1.6  --     Recent Results (from the past 240 hours)  Blood culture (routine x 2)     Status: None (Preliminary result)   Collection Time: 05/18/24 12:05 AM   Specimen: BLOOD LEFT FOREARM  Result Value Ref Range Status   Specimen Description BLOOD LEFT FOREARM  Final   Special Requests   Final    BOTTLES DRAWN AEROBIC AND ANAEROBIC Blood Culture results may not be optimal due to an inadequate volume of blood received in culture bottles   Culture   Final    NO GROWTH 1 DAY Performed at [facility], [address] [place], [place] [address]    Report Status PENDING  Incomplete  Blood culture (routine x 2)     Status: None (Preliminary result)   Collection Time: 05/19/24 12:00 AM   Specimen: BLOOD  Result Value Ref Range Status   Specimen Description BLOOD LEFT ANTECUBITAL  Final   Special Requests   Final    BOTTLES DRAWN AEROBIC AND ANAEROBIC Blood Culture adequate volume   Culture   Final    NO GROWTH 1 DAY Performed at [facility], [address], [place], [place] [address]    Report Status PENDING  Incomplete  Resp panel by RT-PCR (RSV, Flu A&B, Covid) Anterior Nasal Swab     Status: None   Collection Time: 05/19/24  1:50 AM   Specimen: Anterior Nasal Swab  Result Value  Ref Range Status   SARS Coronavirus 2 by RT PCR NEGATIVE NEGATIVE Final   Influenza A by PCR NEGATIVE NEGATIVE Final   Influenza B by PCR NEGATIVE NEGATIVE Final    Comment: (NOTE) The Xpert Xpress SARS-CoV-2/FLU/RSV plus assay is intended as an aid in the diagnosis of influenza from Nasopharyngeal swab specimens and should not be used as a sole basis for treatment. Nasal washings and aspirates are unacceptable for Xpert Xpress SARS-CoV-2/FLU/RSV  testing.  Fact Sheet for Patients: BloggerCourse.com  Fact Sheet for Healthcare Providers: SeriousBroker.it  This test is not yet approved or cleared by the [place]  FDA and has been authorized for detection and/or diagnosis of SARS-CoV-2 by FDA under an Emergency Use Authorization (EUA). This EUA will remain in effect (meaning this test can be used) for the duration of the COVID-19 declaration under Section 564(b)(1) of the Act, 21 U.S.C. section 360bbb-3(b)(1), unless the authorization is terminated or revoked.     Resp Syncytial Virus by PCR NEGATIVE NEGATIVE Final    Comment: (NOTE) Fact Sheet for Patients: BloggerCourse.com  Fact Sheet for Healthcare Providers: SeriousBroker.it  This test is not yet approved or cleared by the [place]  FDA and has been authorized for detection and/or diagnosis of SARS-CoV-2 by FDA under an Emergency Use Authorization (EUA). This EUA will remain in effect (meaning this test can be used) for the duration of the COVID-19 declaration under Section 564(b)(1) of the Act, 21 U.S.C. section 360bbb-3(b)(1), unless the authorization is terminated or revoked.  Performed at [facility], [address] [place], [place] [address]          Radiology Studies: ECHOCARDIOGRAM COMPLETE Result Date: 05/19/2024    ECHOCARDIOGRAM REPORT   Patient Name:   SHAHZAIB AZEVEDO Date of Exam: 05/19/2024  Medical Rec #:  [id number]    Height:       69.0 in Accession #:    [id number]   Weight:       224.9 lb Date of Birth:  16-Jun-1971    BSA:          2.172 m Patient Age:    52 years     BP:           141/109 mmHg Patient Gender: M            HR:           87 bpm. Exam Location:  Inpatient Procedure: 2D Echo, Cardiac Doppler and Color Doppler (Both Spectral and Color            Flow Doppler were utilized during procedure). Indications:    Elevated Troponin  History:        Patient has prior history of Echocardiogram examinations, most                 recent 12/26/2022. TIA, COPD, Stroke and Migraine,                 Signs/Symptoms:Chest Pain and Syncope; Risk Factors:Hypertension                 and Current Smoker. DVT.  Sonographer:    Terrilee Few RCS Referring Phys: [id number] JONATHAN SEGARS IMPRESSIONS  1. Left ventricular ejection fraction, by estimation, is 55 to 60%. The left ventricle has normal function. The left ventricle has no regional wall motion abnormalities. There is mild left ventricular hypertrophy. Left ventricular diastolic parameters are consistent with Grade I diastolic dysfunction (impaired relaxation).  2. Right ventricular systolic function is normal. The right ventricular size is normal.  3. The mitral valve is normal in structure. No evidence of mitral valve regurgitation. No evidence of mitral stenosis.  4. The aortic valve is tricuspid. Aortic valve regurgitation is not visualized. No aortic stenosis is present.  5. Aortic dilatation noted. There is moderate dilatation of the aortic root, measuring 42 mm. There is moderate dilatation of the ascending aorta, measuring 43 mm.  6. The inferior vena cava is normal  in size with greater than 50% respiratory variability, suggesting right atrial pressure of 3 mmHg. FINDINGS  Left Ventricle: Left ventricular ejection fraction, by estimation, is 55 to 60%. The left ventricle has normal function. The left ventricle has no regional wall motion  abnormalities. Strain was performed and the global longitudinal strain is indeterminate. The left ventricular internal cavity size was normal in size. There is mild left ventricular hypertrophy. Left ventricular diastolic parameters are consistent with Grade I diastolic dysfunction (impaired relaxation). Right Ventricle: The right ventricular size is normal. No increase in right ventricular wall thickness. Right ventricular systolic function is normal. Left Atrium: Left atrial size was normal in size. Right Atrium: Right atrial size was normal in size. Pericardium: Trivial pericardial effusion is present. The pericardial effusion is posterior to the left ventricle. Mitral Valve: The mitral valve is normal in structure. No evidence of mitral valve regurgitation. No evidence of mitral valve stenosis. Tricuspid Valve: The tricuspid valve is normal in structure. Tricuspid valve regurgitation is trivial. No evidence of tricuspid stenosis. Aortic Valve: The aortic valve is tricuspid. Aortic valve regurgitation is not visualized. No aortic stenosis is present. Aortic valve peak gradient measures 5.1 mmHg. Pulmonic Valve: The pulmonic valve was normal in structure. Pulmonic valve regurgitation is trivial. No evidence of pulmonic stenosis. Aorta: Aortic dilatation noted. There is moderate dilatation of the aortic root, measuring 42 mm. There is moderate dilatation of the ascending aorta, measuring 43 mm. Venous: The inferior vena cava is normal in size with greater than 50% respiratory variability, suggesting right atrial pressure of 3 mmHg. IAS/Shunts: No atrial level shunt detected by color flow Doppler. Additional Comments: 3D was performed not requiring image post processing on an independent workstation and was indeterminate.  LEFT VENTRICLE PLAX 2D LVIDd:         4.60 cm   Diastology LVIDs:         3.20 cm   LV e' medial:    6.42 cm/s LV PW:         1.40 cm   LV E/e' medial:  12.1 LV IVS:        1.30 cm   LV e'  lateral:   11.70 cm/s LVOT diam:     2.40 cm   LV E/e' lateral: 6.6 LV SV:         86 LV SV Index:   40 LVOT Area:     4.52 cm  RIGHT VENTRICLE             IVC RV S prime:     16.00 cm/s  IVC diam: 2.20 cm TAPSE (M-mode): 2.4 cm LEFT ATRIUM             Index        RIGHT ATRIUM           Index LA diam:        3.80 cm 1.75 cm/m   RA Area:     15.60 cm LA Vol (A2C):   61.5 ml 28.32 ml/m  RA Volume:   42.50 ml  19.57 ml/m LA Vol (A4C):   32.3 ml 14.87 ml/m LA Biplane Vol: 45.9 ml 21.14 ml/m  AORTIC VALVE AV Area (Vmax): 4.66 cm AV Vmax:        112.50 cm/s AV Peak Grad:   5.1 mmHg LVOT Vmax:      116.00 cm/s LVOT Vmean:     79.700 cm/s LVOT VTI:       0.190 m  AORTA Ao Root  diam: 4.20 cm Ao Asc diam:  4.30 cm MITRAL VALVE MV Area (PHT): 5.54 cm     SHUNTS MV Decel Time: 137 msec     Systemic VTI:  0.19 m MV E velocity: 77.60 cm/s   Systemic Diam: 2.40 cm MV A velocity: 102.00 cm/s MV E/A ratio:  0.76 Janelle Mediate MD Electronically signed by Janelle Mediate MD Signature Date/Time: 05/19/2024/5:23:17 PM    Final    DG Chest 2 View Result Date: 05/18/2024 CLINICAL DATA:  Chest pain EXAM: CHEST - 2 VIEW COMPARISON:  Chest x-ray 05/18/2024.  CT of the chest 05/18/2024. FINDINGS: The right hilum appears enlarged and there is right perihilar airspace opacities similar to the prior study. Scarring in the right upper lobe is again noted. Scattered interstitial opacities in the left lower lung are unchanged. There is a small right pleural effusion. There is no pneumothorax. The cardiac silhouette is within normal limits. No acute fractures are seen. IMPRESSION: 1. Stable enlarged right hilum with right perihilar airspace opacities. 2. Small right pleural effusion. Electronically Signed   By: Tyron Gallon M.D.   On: 05/18/2024 23:22        Scheduled Meds:  amLODipine   10 mg Oral q morning   aspirin   324 mg Oral Once   Followed by   aspirin   81 mg Oral Daily   azithromycin   500 mg Oral Daily    budesonide -glycopyrrolate -formoterol   2 puff Inhalation BID   cloNIDine   0.2 mg Oral BID   And   cloNIDine   0.1 mg Oral QHS   enoxaparin  (LOVENOX ) injection  50 mg Subcutaneous Q24H   furosemide   60 mg Intravenous BID   hydrochlorothiazide   12.5 mg Oral q morning   ipratropium-albuterol   3 mL Nebulization BID   levETIRAcetam   500 mg Oral BID   losartan   100 mg Oral Daily   nicotine   7 mg Transdermal Daily   sertraline   25 mg Oral Daily   sodium chloride  flush  3 mL Intravenous Q12H   Continuous Infusions:  cefTRIAXone  (ROCEPHIN )  IV 1 g (05/20/24 0050)     LOS: 1 day    Time spent: 45 minutes spent on 05/20/2024 caring for this patient face-to-face including chart review, ordering labs/tests, documenting, discussion with nursing staff, consultants, updating family and interview/physical exam    Rema Care [place], DO [facility] Available via Epic secure chat 7am-7pm After these hours, please refer to coverage provider listed on [email or web address] 05/20/2024, 11:57 AM

## 2024-05-20 NOTE — Plan of Care (Signed)
  Problem: Pain Managment: Goal: General experience of comfort will improve and/or be controlled Outcome: Progressing   Problem: Safety: Goal: Ability to remain free from injury will improve Outcome: Progressing

## 2024-05-20 NOTE — Plan of Care (Signed)
  Problem: Education: Goal: Knowledge of General Education information will improve Description: Including pain rating scale, medication(s)/side effects and non-pharmacologic comfort measures Outcome: Progressing   Problem: Clinical Measurements: Goal: Respiratory complications will improve Outcome: Progressing   Problem: Activity: Goal: Risk for activity intolerance will decrease Outcome: Progressing   Problem: Nutrition: Goal: Adequate nutrition will be maintained Outcome: Progressing   Problem: Elimination: Goal: Will not experience complications related to urinary retention Outcome: Progressing   Problem: Pain Managment: Goal: General experience of comfort will improve and/or be controlled Outcome: Progressing

## 2024-05-21 DIAGNOSIS — J189 Pneumonia, unspecified organism: Secondary | ICD-10-CM | POA: Diagnosis not present

## 2024-05-21 DIAGNOSIS — I5033 Acute on chronic diastolic (congestive) heart failure: Secondary | ICD-10-CM | POA: Diagnosis not present

## 2024-05-21 LAB — BASIC METABOLIC PANEL WITH GFR
Anion gap: 14 (ref 5–15)
BUN: 8 mg/dL (ref 6–20)
CO2: 23 mmol/L (ref 22–32)
Calcium: 8.7 mg/dL — ABNORMAL LOW (ref 8.9–10.3)
Chloride: 101 mmol/L (ref 98–111)
Creatinine, Ser: 0.92 mg/dL (ref 0.61–1.24)
GFR, Estimated: >60 mL/min (ref >60)
Glucose, Bld: 117 mg/dL — ABNORMAL HIGH (ref 70–99)
Potassium: 3.8 mmol/L (ref 3.5–5.1)
Sodium: 138 mmol/L (ref 135–145)

## 2024-05-21 LAB — BRAIN NATRIURETIC PEPTIDE: B Natriuretic Peptide: 158.5 pg/mL — ABNORMAL HIGH (ref 0.0–100.0)

## 2024-05-21 MED ORDER — ALBUTEROL SULFATE (2.5 MG/3ML) 0.083% IN NEBU: 2.5000 mg | INHALATION_SOLUTION | Freq: Four times a day (QID) | RESPIRATORY_TRACT | Status: DC | PRN

## 2024-05-21 NOTE — Plan of Care (Signed)
  Problem: Education: Goal: Knowledge of General Education information will improve Description: Including pain rating scale, medication(s)/side effects and non-pharmacologic comfort measures Outcome: Progressing   Problem: Clinical Measurements: Goal: Respiratory complications will improve Outcome: Progressing   Problem: Activity: Goal: Risk for activity intolerance will decrease Outcome: Progressing   Problem: Pain Managment: Goal: General experience of comfort will improve and/or be controlled Outcome: Progressing   Problem: Safety: Goal: Ability to remain free from injury will improve Outcome: Progressing   Problem: Skin Integrity: Goal: Risk for impaired skin integrity will decrease Outcome: Progressing

## 2024-05-21 NOTE — Progress Notes (Signed)
 PROGRESS NOTE    Richard [name]  RUE:[id number] DOB: 1971-06-11 DOA: 05/18/2024 PCP: Davida Espy, MD    Brief Narrative:   Richard [name] is a 53 y.o. male with past medical history significant for chronic hypoxic respiratory failure/COPD on 4-5L O2, Stage IIIB NSCLC s/p palliative radiation, chemotherapy and immunotherapy stopped due to nonadherence with treatment, current smoker, hx substance use (cocaine), hx DVT completed AC, Hx TBI, prior hx ICH, seizure disorder, diastolic dysfunction, aortic aneurysm, HTN, mood d/o, who was seen in ED on 6/4 with chest pain and SOB, diagnosed with multifocal pneumonia and discharged with Rx Doxycycline ; although had not started. Returns with persistent chest pain and SOB. Reports having off and on chest pain, not sure when this initially started. However has been worse over the past 2 days. Describes sharp R sided and throbbing L sided chest pain. Worse with exertion and resolves with rest. Increase in SOB from his baseline chronic dyspnea. Cough with white sputum. Mild lower ext edema. Denies orthopnea. Chronically on 4-5 L o2 and reports having O2 at home. Currently smoking ~ 1/2 PPD.   In the ED, temperature 90.6 3 Fahrenheit, HR 95, RR 17, BP 137/104, SpO2 100% on 4 L nasal cannula.  WBC 7.5, hemoglobin 18.2, platelet count 246.  Sodium 139, potassium 4.0, chloride 107, CO2 16, glucose 90, BUN 10, creatinine 0.91.  AST 30, ALT 25, total bilirubin 0.9.  BNP 258.7.  High sensitive troponin 18>23>15.  UDS positive for cocaine.  Influenza A/B, COVID/RSV PCR negative.  D-dimer 0.90.  Chest x-ray with minimal left basilar atelectasis versus infiltrate.  CT angiogram chest with no pulmonary embolism, diffuse peribronchial micronodular pattern consistent with inflammatory versus infectious process, new 13 x 11 mm pleural-based nodule left lower lobe may represent rounded atelectasis, stable radiation changes right upper lobe/right hilum.  EKG personally reviewed with  sinus tachycardia, rate 101, no concerning ST elevation/depressions or T wave inversions.  Patient was started on azithromycin  and ceftriaxone .  TRH consulted for admission for further evaluation management of multifocal pneumonia versus CHF exacerbation; chest pain in setting of active cocaine abuse.  Assessment & Plan:   Community acquired pneumonia  Acute on chronic hypoxic respiratory failure, home 4-5L O2  Hx acute onset 2-3 days of chest pain and SOB, productive cough. Desat to mid 80s with ambulation on 4L. WBC 6.  Influenza/COVID/RSV PCR negative.  CTA from 6/4 with diffuse peribronchial micronodular infiltrate. CXR today with R hilar and perihilar airspace disease, small R pleural effusion. Suspect multifocal / atypical pneumonia pattern, however should have repeat imaging with his hx of malignancy.  -- Ceftriaxone  1 g IV every 24 hours -- Azithromycin  500 mg daily -- Breztri  2 puff BID (substituted for home Trelegy Ellipta ) -- DuoNebs every 6 hours scheduled -- albuterol  every 4 hours as needed,  -- incentive spirometer, flutter valve -- Recommend repeat CT chest in 6 to 8 weeks to assess for resolution  Chronic chronic diastolic congestive heart failure TTE with LVEF 55 to 96%, no LV RWMA, mild LVH, grade 1 diastolic dysfunction, no aortic stenosis. -- net negative 1.6L past 24h, net negative 3.6L since admission -- Low-salt diet, 1800 mL fluid restriction -- Furosemide  60 mg IV every 12 hours -- Strict I's and O's and daily weights   Acute myocardial injury  Exertional chest pain  Hx exertional chest pain, intermittent worse in past 2 days, exertional and relieved with rest. EKG with no acute ischemic changes. Suspect may have anginal pain and demand  ischemia related to hypoxia from CAP and AoCHRF per above.  UDS positive for cocaine. -- Hs Troponin 18>23>19>15 -- Aspirin  81 mg p.o. daily -- TTE: no regional wall motion abnormalities -- Monitor on telemetry -- Illicit  substance abstinence   Substance use, + Cocaine  Active smoking  Counseled on smoking, cocaine cessation; Denies any active cocaine / crack use despite result  -- TOC consult for substance use resources -- Nicotine  7 mg patch with gum as needed   Stage IIIB NSCLC:  Follows with Dr. Liam Redhead. s/p palliative radiation, chemotherapy and immunotherapy stopped due to nonadherence with treatment, currently on active surveillance   Hx DVT:  2016, completed 6 months anticoagulation   Hx TBI, L frontal encephalomalacia, hx prior ICH: Noted   Seizure disorder: continue home Keppra    History aortic aneurysm: Outpatient surveillance  Hypertension: Continue home amlodipine , clonidine  3 times daily, losartan .  Mood disorder: Continue home sertraline .  Erythrocytosis: Hb 18. Suspect related to chronic hypoxia    DVT prophylaxis: Lovenox     Code Status: Full Code Family Communication: No family at bedside this morning  Disposition Plan:  Level of care: Telemetry Medical Status is: Inpatient Remains inpatient appropriate because: IV diuresis, IV antibiotics    Consultants:  None  Procedures:  TTE:   Antimicrobials:  Azithromycin  6/5>> Ceftriaxone  6/5>>   Subjective: Patient seen examined bedside, sitting on edge of bed.  Patient with multiple cans of soda at the bedside.  Discussed with the patient needs to limit fluid restriction and salt intake as this is contributing factor to his volume overload.  Remains chest pain-free, shortness of breath improving.  Patient with no other specific questions or concerns or complaints at this time.  Denies headache, no vision changes, no fever/chills/night sweats, no nausea/vomiting/diarrhea, no focal weakness, no fatigue, no paresthesias.  No acute events overnight per nursing staff.  Objective: Vitals:   05/20/24 2127 05/21/24 0409 05/21/24 0417 05/21/24 0720  BP: (!) 121/94 (!) 116/91  114/87  Pulse:  86  87  Resp:  16  16  Temp:   97.8 F (36.6 C)  (!) 97.4 F (36.3 C)  TempSrc:  Oral  Oral  SpO2:  97%  95%  Weight:   84.7 kg   Height:        Intake/Output Summary (Last 24 hours) at 05/21/2024 1030 Last data filed at 05/21/2024 0934 Gross per 24 hour  Intake --  Output 800 ml  Net -800 ml   Filed Weights   05/18/24 2303 05/20/24 0500 05/21/24 0417  Weight: 102 kg 102 kg 84.7 kg    Examination:  Physical Exam: GEN: NAD, alert and oriented x 3, chronically ill appearance, appears older than stated age HEENT: NCAT, PERRL, EOMI, sclera clear, MMM PULM: Breath sounds diminished bilateral bases with crackles, no wheezing, normal respiratory effort without accessory muscle use, on 5 L nasal cannula with SpO2 97% at rest (baseline 4-5L) CV: RRR w/o M/G/R GI: abd soft, NTND, NABS, no R/G/M MSK: Trace bilateral lower extremity peripheral edema, moves all extremities independently NEURO: CN II-XII intact, no focal deficits, sensation to light touch intact PSYCH: normal mood/affect Integumentary: dry/intact, no rashes or wounds    Data Reviewed: I have personally reviewed following labs and imaging studies  CBC: Recent Labs  Lab 05/18/24 0046 05/18/24 2324 05/19/24 0619  WBC 7.5 6.2 6.9  NEUTROABS 6.4  --   --   HGB 18.2* 18.2* 16.4  HCT 52.5* 55.9* 49.2  MCV 97.8 97.6 97.0  PLT 246 268 254   Basic Metabolic Panel: Recent Labs  Lab 05/18/24 0046 05/18/24 2324 05/19/24 0619 05/20/24 0538 05/21/24 0511  NA 139 144 141 142 138  K 4.0 3.9 4.1 4.1 3.8  CL 107 107 110 110 101  CO2 16* 26 23 22 23   GLUCOSE 90 98 94 105* 117*  BUN 10 9 11 7 8   CREATININE 0.91 1.17 0.84 0.86 0.92  CALCIUM 8.8* 9.0 8.4* 8.6* 8.7*  MG  --   --  1.8  --   --   PHOS  --   --  3.7  --   --    GFR: Estimated Creatinine Clearance: 92.9 mL/min (by C-G formula based on SCr of 0.92 mg/dL). Liver Function Tests: Recent Labs  Lab 05/18/24 0046  AST 30  ALT 25  ALKPHOS 68  BILITOT 0.9  PROT 6.9  ALBUMIN 3.6   No  results for input(s): "LIPASE", "AMYLASE" in the last 168 hours. No results for input(s): "AMMONIA" in the last 168 hours. Coagulation Profile: No results for input(s): "INR", "PROTIME" in the last 168 hours. Cardiac Enzymes: No results for input(s): "CKTOTAL", "CKMB", "CKMBINDEX", "TROPONINI" in the last 168 hours. BNP (last 3 results) No results for input(s): "PROBNP" in the last 8760 hours. HbA1C: Recent Labs    05/19/24 0619  HGBA1C 6.0*   CBG: No results for input(s): "GLUCAP" in the last 168 hours. Lipid Profile: Recent Labs    05/19/24 0619  CHOL 107  HDL 56  LDLCALC 44  TRIG 36  CHOLHDL 1.9   Thyroid  Function Tests: No results for input(s): "TSH", "T4TOTAL", "FREET4", "T3FREE", "THYROIDAB" in the last 72 hours. Anemia Panel: No results for input(s): "VITAMINB12", "FOLATE", "FERRITIN", "TIBC", "IRON", "RETICCTPCT" in the last 72 hours. Sepsis Labs: Recent Labs  Lab 05/19/24 0032 05/19/24 0619  PROCALCITON  --  <0.10  LATICACIDVEN 1.6  --     Recent Results (from the past 240 hours)  Blood culture (routine x 2)     Status: None (Preliminary result)   Collection Time: 05/18/24 12:05 AM   Specimen: BLOOD LEFT FOREARM  Result Value Ref Range Status   Specimen Description BLOOD LEFT FOREARM  Final   Special Requests   Final    BOTTLES DRAWN AEROBIC AND ANAEROBIC Blood Culture results may not be optimal due to an inadequate volume of blood received in culture bottles   Culture   Final    NO GROWTH 1 DAY Performed at [facility], [address], [place], [place] [address]    Report Status PENDING  Incomplete  Blood culture (routine x 2)     Status: None (Preliminary result)   Collection Time: 05/19/24 12:00 AM   Specimen: BLOOD  Result Value Ref Range Status   Specimen Description BLOOD LEFT ANTECUBITAL  Final   Special Requests   Final    BOTTLES DRAWN AEROBIC AND ANAEROBIC Blood Culture adequate volume   Culture   Final    NO GROWTH 1  DAY Performed at [facility], [address], [place], [place] [address]    Report Status PENDING  Incomplete  Resp panel by RT-PCR (RSV, Flu A&B, Covid) Anterior Nasal Swab     Status: None   Collection Time: 05/19/24  1:50 AM   Specimen: Anterior Nasal Swab  Result Value Ref Range Status   SARS Coronavirus 2 by RT PCR NEGATIVE NEGATIVE Final   Influenza A by PCR NEGATIVE NEGATIVE Final   Influenza B  by PCR NEGATIVE NEGATIVE Final    Comment: (NOTE) The Xpert Xpress SARS-CoV-2/FLU/RSV plus assay is intended as an aid in the diagnosis of influenza from Nasopharyngeal swab specimens and should not be used as a sole basis for treatment. Nasal washings and aspirates are unacceptable for Xpert Xpress SARS-CoV-2/FLU/RSV testing.  Fact Sheet for Patients: BloggerCourse.com  Fact Sheet for Healthcare Providers: SeriousBroker.it  This test is not yet approved or cleared by the [place]  FDA and has been authorized for detection and/or diagnosis of SARS-CoV-2 by FDA under an Emergency Use Authorization (EUA). This EUA will remain in effect (meaning this test can be used) for the duration of the COVID-19 declaration under Section 564(b)(1) of the Act, 21 U.S.C. section 360bbb-3(b)(1), unless the authorization is terminated or revoked.     Resp Syncytial Virus by PCR NEGATIVE NEGATIVE Final    Comment: (NOTE) Fact Sheet for Patients: BloggerCourse.com  Fact Sheet for Healthcare Providers: SeriousBroker.it  This test is not yet approved or cleared by the [place]  FDA and has been authorized for detection and/or diagnosis of SARS-CoV-2 by FDA under an Emergency Use Authorization (EUA). This EUA will remain in effect (meaning this test can be used) for the duration of the COVID-19 declaration under Section 564(b)(1) of the Act, 21 U.S.C. section 360bbb-3(b)(1), unless the  authorization is terminated or revoked.  Performed at [facility], [address] [place], [place] [address]          Radiology Studies: ECHOCARDIOGRAM COMPLETE Result Date: 05/19/2024    ECHOCARDIOGRAM REPORT   Patient Name:   EWING FANDINO Date of Exam: 05/19/2024 Medical Rec #:  [id number]    Height:       69.0 in Accession #:    [id number]   Weight:       224.9 lb Date of Birth:  November 21, 1971    BSA:          2.172 m Patient Age:    52 years     BP:           141/109 mmHg Patient Gender: M            HR:           87 bpm. Exam Location:  Inpatient Procedure: 2D Echo, Cardiac Doppler and Color Doppler (Both Spectral and Color            Flow Doppler were utilized during procedure). Indications:    Elevated Troponin  History:        Patient has prior history of Echocardiogram examinations, most                 recent 12/26/2022. TIA, COPD, Stroke and Migraine,                 Signs/Symptoms:Chest Pain and Syncope; Risk Factors:Hypertension                 and Current Smoker. DVT.  Sonographer:    Terrilee Few RCS Referring Phys: [id number] JONATHAN SEGARS IMPRESSIONS  1. Left ventricular ejection fraction, by estimation, is 55 to 60%. The left ventricle has normal function. The left ventricle has no regional wall motion abnormalities. There is mild left ventricular hypertrophy. Left ventricular diastolic parameters are consistent with Grade I diastolic dysfunction (impaired relaxation).  2. Right ventricular systolic function is normal. The right ventricular size is normal.  3. The mitral valve is normal in structure. No evidence of mitral valve regurgitation. No evidence of mitral stenosis.  4.  The aortic valve is tricuspid. Aortic valve regurgitation is not visualized. No aortic stenosis is present.  5. Aortic dilatation noted. There is moderate dilatation of the aortic root, measuring 42 mm. There is moderate dilatation of the ascending aorta, measuring 43 mm.  6. The inferior vena cava is normal  in size with greater than 50% respiratory variability, suggesting right atrial pressure of 3 mmHg. FINDINGS  Left Ventricle: Left ventricular ejection fraction, by estimation, is 55 to 60%. The left ventricle has normal function. The left ventricle has no regional wall motion abnormalities. Strain was performed and the global longitudinal strain is indeterminate. The left ventricular internal cavity size was normal in size. There is mild left ventricular hypertrophy. Left ventricular diastolic parameters are consistent with Grade I diastolic dysfunction (impaired relaxation). Right Ventricle: The right ventricular size is normal. No increase in right ventricular wall thickness. Right ventricular systolic function is normal. Left Atrium: Left atrial size was normal in size. Right Atrium: Right atrial size was normal in size. Pericardium: Trivial pericardial effusion is present. The pericardial effusion is posterior to the left ventricle. Mitral Valve: The mitral valve is normal in structure. No evidence of mitral valve regurgitation. No evidence of mitral valve stenosis. Tricuspid Valve: The tricuspid valve is normal in structure. Tricuspid valve regurgitation is trivial. No evidence of tricuspid stenosis. Aortic Valve: The aortic valve is tricuspid. Aortic valve regurgitation is not visualized. No aortic stenosis is present. Aortic valve peak gradient measures 5.1 mmHg. Pulmonic Valve: The pulmonic valve was normal in structure. Pulmonic valve regurgitation is trivial. No evidence of pulmonic stenosis. Aorta: Aortic dilatation noted. There is moderate dilatation of the aortic root, measuring 42 mm. There is moderate dilatation of the ascending aorta, measuring 43 mm. Venous: The inferior vena cava is normal in size with greater than 50% respiratory variability, suggesting right atrial pressure of 3 mmHg. IAS/Shunts: No atrial level shunt detected by color flow Doppler. Additional Comments: 3D was performed not  requiring image post processing on an independent workstation and was indeterminate.  LEFT VENTRICLE PLAX 2D LVIDd:         4.60 cm   Diastology LVIDs:         3.20 cm   LV e' medial:    6.42 cm/s LV PW:         1.40 cm   LV E/e' medial:  12.1 LV IVS:        1.30 cm   LV e' lateral:   11.70 cm/s LVOT diam:     2.40 cm   LV E/e' lateral: 6.6 LV SV:         86 LV SV Index:   40 LVOT Area:     4.52 cm  RIGHT VENTRICLE             IVC RV S prime:     16.00 cm/s  IVC diam: 2.20 cm TAPSE (M-mode): 2.4 cm LEFT ATRIUM             Index        RIGHT ATRIUM           Index LA diam:        3.80 cm 1.75 cm/m   RA Area:     15.60 cm LA Vol (A2C):   61.5 ml 28.32 ml/m  RA Volume:   42.50 ml  19.57 ml/m LA Vol (A4C):   32.3 ml 14.87 ml/m LA Biplane Vol: 45.9 ml 21.14 ml/m  AORTIC VALVE AV Area (Vmax):  4.66 cm AV Vmax:        112.50 cm/s AV Peak Grad:   5.1 mmHg LVOT Vmax:      116.00 cm/s LVOT Vmean:     79.700 cm/s LVOT VTI:       0.190 m  AORTA Ao Root diam: 4.20 cm Ao Asc diam:  4.30 cm MITRAL VALVE MV Area (PHT): 5.54 cm     SHUNTS MV Decel Time: 137 msec     Systemic VTI:  0.19 m MV E velocity: 77.60 cm/s   Systemic Diam: 2.40 cm MV A velocity: 102.00 cm/s MV E/A ratio:  0.76 Janelle Mediate MD Electronically signed by Janelle Mediate MD Signature Date/Time: 05/19/2024/5:23:17 PM    Final         Scheduled Meds:  amLODipine   10 mg Oral q morning   aspirin   324 mg Oral Once   Followed by   aspirin   81 mg Oral Daily   budesonide -glycopyrrolate -formoterol   2 puff Inhalation BID   cloNIDine   0.2 mg Oral BID   And   cloNIDine   0.1 mg Oral QHS   enoxaparin  (LOVENOX ) injection  50 mg Subcutaneous Q24H   furosemide   60 mg Intravenous BID   hydrochlorothiazide   12.5 mg Oral q morning   ipratropium-albuterol   3 mL Nebulization BID   levETIRAcetam   500 mg Oral BID   losartan   100 mg Oral Daily   nicotine   7 mg Transdermal Daily   sertraline   25 mg Oral Daily   sodium chloride  flush  3 mL Intravenous Q12H    Continuous Infusions:  cefTRIAXone  (ROCEPHIN )  IV 1 g (05/21/24 0104)     LOS: 2 days    Time spent: 45 minutes spent on 05/21/2024 caring for this patient face-to-face including chart review, ordering labs/tests, documenting, discussion with nursing staff, consultants, updating family and interview/physical exam    Rema Care [place], DO [facility] Available via Epic secure chat 7am-7pm After these hours, please refer to coverage provider listed on [email or web address] 05/21/2024, 10:30 AM

## 2024-05-22 DIAGNOSIS — J189 Pneumonia, unspecified organism: Secondary | ICD-10-CM | POA: Diagnosis not present

## 2024-05-22 DIAGNOSIS — I5033 Acute on chronic diastolic (congestive) heart failure: Secondary | ICD-10-CM | POA: Diagnosis not present

## 2024-05-22 LAB — BASIC METABOLIC PANEL WITH GFR
Anion gap: 14 (ref 5–15)
BUN: 14 mg/dL (ref 6–20)
CO2: 23 mmol/L (ref 22–32)
Calcium: 8.9 mg/dL (ref 8.9–10.3)
Chloride: 102 mmol/L (ref 98–111)
Creatinine, Ser: 1.02 mg/dL (ref 0.61–1.24)
GFR, Estimated: >60 mL/min (ref >60)
Glucose, Bld: 92 mg/dL (ref 70–99)
Potassium: 3.7 mmol/L (ref 3.5–5.1)
Sodium: 139 mmol/L (ref 135–145)

## 2024-05-22 LAB — BRAIN NATRIURETIC PEPTIDE: B Natriuretic Peptide: 150.8 pg/mL — ABNORMAL HIGH (ref 0.0–100.0)

## 2024-05-22 MED ORDER — FUROSEMIDE 40 MG PO TABS: 40.0000 mg | ORAL_TABLET | Freq: Every day | ORAL | 0 refills | Status: AC

## 2024-05-22 NOTE — Progress Notes (Signed)
 Discharge instructions given to pt. Pt verbalized understanding of all teaching and had no further questions. Pt discharged to home with wife with all belongings and paperwork via wheelchair by volunteer staff

## 2024-05-22 NOTE — Discharge Summary (Signed)
 Physician Discharge Summary  Richard [name] ZOX:[id number] DOB: 1971-09-21 DOA: 05/18/2024  PCP: Richard Espy, MD  Admit date: 05/18/2024 Discharge date: 05/22/2024  Admitted From: Home Disposition: Home  Recommendations for Outpatient Follow-up:  Follow up with PCP in 1-2 weeks Referral placed to cardiology for further management of his chronic diastolic congestive heart failure Started on furosemide  40 mg p.o. daily Continue to encourage low-salt diet, fluid restrictions as he is high risk for recurrent hospitalizations to his dietary indiscretions Encourage abstinence from illicit substances, positive for cocaine this admission likely complicating his underlying congestive heart failure Encouraged to maintain daily weight log to bring to the next PCP/specialist visit Recommend repeat CT chest 6/8 weeks to ensure resolution Please obtain BMP in one week to ensure renal function remained stable  Home Health: No Equipment/Devices: Mains on his home oxygen  4-5 L per nasal cannula  Discharge Condition: Stable CODE STATUS: Full code Diet recommendation: Heart healthy, 2 g salt restricted diet with 1800 mL fluid restriction  History of present illness:  Richard [name] is a 53 y.o. male with past medical history significant for chronic hypoxic respiratory failure/COPD on 4-5L O2, Stage IIIB NSCLC s/p palliative radiation, chemotherapy and immunotherapy stopped due to nonadherence with treatment, current smoker, hx substance use (cocaine), hx DVT completed AC, Hx TBI, prior hx ICH, seizure disorder, diastolic dysfunction, aortic aneurysm, HTN, mood d/o, who was seen in ED on 6/4 with chest pain and SOB, diagnosed with multifocal pneumonia and discharged with Rx Doxycycline ; although had not started. Returns with persistent chest pain and SOB. Reports having off and on chest pain, not sure when this initially started. However has been worse over the past 2 days. Describes sharp R sided and  throbbing L sided chest pain. Worse with exertion and resolves with rest. Increase in SOB from his baseline chronic dyspnea. Cough with white sputum. Mild lower ext edema. Denies orthopnea. Chronically on 4-5 L o2 and reports having O2 at home. Currently smoking ~ 1/2 PPD.    In the ED, temperature 90.6 3 Fahrenheit, HR 95, RR 17, BP 137/104, SpO2 100% on 4 L nasal cannula.  WBC 7.5, hemoglobin 18.2, platelet count 246.  Sodium 139, potassium 4.0, chloride 107, CO2 16, glucose 90, BUN 10, creatinine 0.91.  AST 30, ALT 25, total bilirubin 0.9.  BNP 258.7.  High sensitive troponin 18>23>15.  UDS positive for cocaine.  Influenza A/B, COVID/RSV PCR negative.  D-dimer 0.90.  Chest x-ray with minimal left basilar atelectasis versus infiltrate.  CT angiogram chest with no pulmonary embolism, diffuse peribronchial micronodular pattern consistent with inflammatory versus infectious process, new 13 x 11 mm pleural-based nodule left lower lobe may represent rounded atelectasis, stable radiation changes right upper lobe/right hilum.  EKG personally reviewed with sinus tachycardia, rate 101, no concerning ST elevation/depressions or T wave inversions.  Patient was started on azithromycin  and ceftriaxone .  TRH consulted for admission for further evaluation management of multifocal pneumonia versus CHF exacerbation; chest pain in setting of active cocaine abuse.  Hospital course:  Community acquired pneumonia  Acute on chronic hypoxic respiratory failure, home 4-5L O2  Hx acute onset 2-3 days of chest pain and SOB, productive cough. Desat to mid 80s with ambulation on 4L. WBC 6.  Influenza/COVID/RSV PCR negative.  CTA from 6/4 with diffuse peribronchial micronodular infiltrate. CXR today with R hilar and perihilar airspace disease, small R pleural effusion. Suspect multifocal / atypical pneumonia pattern, however should have repeat imaging with his hx of malignancy.  Patient completed  course of azithromycin  and  ceftriaxone .  Continues on Trelegy Ellipta  inhaler outpatient.  Recommend repeat CT chest in 6-8 weeks to assess for resolution.   Chronic chronic diastolic congestive heart failure TTE with LVEF 55 to 96%, no LV RWMA, mild LVH, grade 1 diastolic dysfunction, no aortic stenosis.  Patient started on IV diuresis, discussed 2 g salt restricted and 1800 mL fluid restricted diet.  Patient diuresed well and will start furosemide  40 mg p.o. daily on discharge.  BNP improved from 258 to 150 at time of discharge.  Recommend outpatient follow-up with weight log to bring to next visit.  Referred to cardiology outpatient.   Acute myocardial injury  Exertional chest pain  Hx exertional chest pain, intermittent worse in past 2 days, exertional and relieved with rest. EKG with no acute ischemic changes. Suspect may have anginal pain and demand ischemia related to hypoxia from CAP and AoCHRF per above.  UDS positive for cocaine. Hs Troponin 18>23>19>15.  TTE with no regional wall motion abnormalities.  Cocaine abstinence.   Substance use, + Cocaine  Active smoking  Counseled on smoking, cocaine cessation; Denies any active cocaine / crack use despite result. TOC consulted for substance use resources.    Stage IIIB NSCLC:  Follows with Dr. Liam [name]. s/p palliative radiation, chemotherapy and immunotherapy stopped due to nonadherence with treatment, currently on active surveillance    Hx DVT:  2016, completed 6 months anticoagulation    Hx TBI, L frontal encephalomalacia, hx prior ICH: Noted    Seizure disorder: continue home Keppra     History aortic aneurysm: Outpatient surveillance   Hypertension: Continue home amlodipine , clonidine  3 times daily, losartan , started on 6 as above   Mood disorder: Continue home sertraline .   Erythrocytosis: Hb 18. Suspect related to chronic hypoxia   Discharge Diagnoses:  Principal Problem:   CAP (community acquired pneumonia) Active Problems:   Acute on chronic  respiratory failure with hypoxia (HCC)   Elevated troponin   Cocaine use   Encounter for smoking cessation counseling    Discharge Instructions  Discharge Instructions     Ambulatory referral to Cardiology   Complete by: As directed    Call MD for:  difficulty breathing, headache or visual disturbances   Complete by: As directed    Call MD for:  extreme fatigue   Complete by: As directed    Call MD for:  persistant dizziness or light-headedness   Complete by: As directed    Call MD for:  persistant nausea and vomiting   Complete by: As directed    Call MD for:  severe uncontrolled pain   Complete by: As directed    Call MD for:  temperature >100.4   Complete by: As directed    Diet - low sodium heart healthy   Complete by: As directed    Increase activity slowly   Complete by: As directed       Allergies as of 05/22/2024       Reactions   Dilaudid  [hydromorphone  Hcl] Nausea Only, Other (See Comments)   Bradycardia and hyperthermia, too   Tape Itching, Dermatitis        Medication List     STOP taking these medications    doxycycline  100 MG capsule Commonly known as: VIBRAMYCIN        TAKE these medications    albuterol  108 (90 Base) MCG/ACT inhaler Commonly known as: VENTOLIN  HFA Inhale 2 puffs into the lungs every 6 (six) hours as needed for wheezing or shortness  of breath.   albuterol  (2.5 MG/3ML) 0.083% nebulizer solution Commonly known as: PROVENTIL  Take 2.5 mg by nebulization 3 (three) times daily as needed for shortness of breath or wheezing.   amLODipine  10 MG tablet Commonly known as: NORVASC  Take 10 mg by mouth every morning.   cloNIDine  0.1 MG tablet Commonly known as: CATAPRES  Take 0.1 mg by mouth. Take 2 tablets by mouth at 9am and 5pm and 1 tablet at bedtime   furosemide  40 MG tablet Commonly known as: Lasix  Take 1 tablet (40 mg total) by mouth daily.   GOODYS BODY PAIN PO Take 1 packet by mouth daily as needed (for pain).    hydrochlorothiazide  12.5 MG tablet Commonly known as: HYDRODIURIL  Take 12.5 mg by mouth every morning.   levETIRAcetam  500 MG tablet Commonly known as: KEPPRA  Take 1 tablet (500 mg total) by mouth 2 (two) times daily.   losartan  100 MG tablet Commonly known as: COZAAR  Take 100 mg by mouth daily.   sertraline  25 MG tablet Commonly known as: ZOLOFT  Take 25 mg by mouth daily.   Trelegy Ellipta  200-62.5-25 MCG/ACT Aepb Generic drug: Fluticasone -Umeclidin-Vilant Inhale 1 puff into the lungs daily.        Follow-up Information     Richard Espy, MD. Schedule an appointment as soon as possible for a visit in 1 week(s).   Specialty: Internal Medicine Contact information: [address] [place] [address] [phone]         [facility] at [facility] A Dept of [facility] [place]. [facility] [employer]. Schedule an appointment as soon as possible for a visit.   Specialty: Cardiology Contact information: [address] [place]  [address] [phone]               Allergies  Allergen Reactions   Dilaudid  [Hydromorphone  Hcl] Nausea Only and Other (See Comments)    Bradycardia and hyperthermia, too   Tape Itching and Dermatitis    Consultations: None   Procedures/Studies: ECHOCARDIOGRAM COMPLETE Result Date: 05/19/2024    ECHOCARDIOGRAM REPORT   Patient Name:   Richard [name] Date of Exam: 05/19/2024 Medical Rec #:  [id number]    Height:       69.0 in Accession #:    [id number]   Weight:       224.9 lb Date of Birth:  08-Aug-1971    BSA:          2.172 m Patient Age:    52 years     BP:           141/109 mmHg Patient Gender: M            HR:           87 bpm. Exam Location:  Inpatient Procedure: 2D Echo, Cardiac Doppler and Color Doppler (Both Spectral and Color            Flow Doppler were utilized during procedure). Indications:    Elevated Troponin  History:        Patient has prior history of Echocardiogram examinations, most                 recent  12/26/2022. TIA, COPD, Stroke and Migraine,                 Signs/Symptoms:Chest Pain and Syncope; Risk Factors:Hypertension                 and Current Smoker. DVT.  Sonographer:    Terrilee Few  RCS Referring Phys: [id number] JONATHAN SEGARS IMPRESSIONS  1. Left ventricular ejection fraction, by estimation, is 55 to 60%. The left ventricle has normal function. The left ventricle has no regional wall motion abnormalities. There is mild left ventricular hypertrophy. Left ventricular diastolic parameters are consistent with Grade I diastolic dysfunction (impaired relaxation).  2. Right ventricular systolic function is normal. The right ventricular size is normal.  3. The mitral valve is normal in structure. No evidence of mitral valve regurgitation. No evidence of mitral stenosis.  4. The aortic valve is tricuspid. Aortic valve regurgitation is not visualized. No aortic stenosis is present.  5. Aortic dilatation noted. There is moderate dilatation of the aortic root, measuring 42 mm. There is moderate dilatation of the ascending aorta, measuring 43 mm.  6. The inferior vena cava is normal in size with greater than 50% respiratory variability, suggesting right atrial pressure of 3 mmHg. FINDINGS  Left Ventricle: Left ventricular ejection fraction, by estimation, is 55 to 60%. The left ventricle has normal function. The left ventricle has no regional wall motion abnormalities. Strain was performed and the global longitudinal strain is indeterminate. The left ventricular internal cavity size was normal in size. There is mild left ventricular hypertrophy. Left ventricular diastolic parameters are consistent with Grade I diastolic dysfunction (impaired relaxation). Right Ventricle: The right ventricular size is normal. No increase in right ventricular wall thickness. Right ventricular systolic function is normal. Left Atrium: Left atrial size was normal in size. Right Atrium: Right atrial size was normal in size.  Pericardium: Trivial pericardial effusion is present. The pericardial effusion is posterior to the left ventricle. Mitral Valve: The mitral valve is normal in structure. No evidence of mitral valve regurgitation. No evidence of mitral valve stenosis. Tricuspid Valve: The tricuspid valve is normal in structure. Tricuspid valve regurgitation is trivial. No evidence of tricuspid stenosis. Aortic Valve: The aortic valve is tricuspid. Aortic valve regurgitation is not visualized. No aortic stenosis is present. Aortic valve peak gradient measures 5.1 mmHg. Pulmonic Valve: The pulmonic valve was normal in structure. Pulmonic valve regurgitation is trivial. No evidence of pulmonic stenosis. Aorta: Aortic dilatation noted. There is moderate dilatation of the aortic root, measuring 42 mm. There is moderate dilatation of the ascending aorta, measuring 43 mm. Venous: The inferior vena cava is normal in size with greater than 50% respiratory variability, suggesting right atrial pressure of 3 mmHg. IAS/Shunts: No atrial level shunt detected by color flow Doppler. Additional Comments: 3D was performed not requiring image post processing on an independent workstation and was indeterminate.  LEFT VENTRICLE PLAX 2D LVIDd:         4.60 cm   Diastology LVIDs:         3.20 cm   LV e' medial:    6.42 cm/s LV PW:         1.40 cm   LV E/e' medial:  12.1 LV IVS:        1.30 cm   LV e' lateral:   11.70 cm/s LVOT diam:     2.40 cm   LV E/e' lateral: 6.6 LV SV:         86 LV SV Index:   40 LVOT Area:     4.52 cm  RIGHT VENTRICLE             IVC RV S prime:     16.00 cm/s  IVC diam: 2.20 cm TAPSE (M-mode): 2.4 cm LEFT ATRIUM  Index        RIGHT ATRIUM           Index LA diam:        3.80 cm 1.75 cm/m   RA Area:     15.60 cm LA Vol (A2C):   61.5 ml 28.32 ml/m  RA Volume:   42.50 ml  19.57 ml/m LA Vol (A4C):   32.3 ml 14.87 ml/m LA Biplane Vol: 45.9 ml 21.14 ml/m  AORTIC VALVE AV Area (Vmax): 4.66 cm AV Vmax:        112.50 cm/s  AV Peak Grad:   5.1 mmHg LVOT Vmax:      116.00 cm/s LVOT Vmean:     79.700 cm/s LVOT VTI:       0.190 m  AORTA Ao Root diam: 4.20 cm Ao Asc diam:  4.30 cm MITRAL VALVE MV Area (PHT): 5.54 cm     SHUNTS MV Decel Time: 137 msec     Systemic VTI:  0.19 m MV E velocity: 77.60 cm/s   Systemic Diam: 2.40 cm MV A velocity: 102.00 cm/s MV E/A ratio:  0.76 Janelle Mediate MD Electronically signed by Janelle Mediate MD Signature Date/Time: 05/19/2024/5:23:17 PM    Final    DG Chest 2 View Result Date: 05/18/2024 CLINICAL DATA:  Chest pain EXAM: CHEST - 2 VIEW COMPARISON:  Chest x-ray 05/18/2024.  CT of the chest 05/18/2024. FINDINGS: The right hilum appears enlarged and there is right perihilar airspace opacities similar to the prior study. Scarring in the right upper lobe is again noted. Scattered interstitial opacities in the left lower lung are unchanged. There is a small right pleural effusion. There is no pneumothorax. The cardiac silhouette is within normal limits. No acute fractures are seen. IMPRESSION: 1. Stable enlarged right hilum with right perihilar airspace opacities. 2. Small right pleural effusion. Electronically Signed   By: Tyron Gallon M.D.   On: 05/18/2024 23:22   CT Angio Chest PE W and/or Wo Contrast Result Date: 05/18/2024 EXAM: CTA of the Chest with contrast for PE 05/18/2024 04:17:28 AM TECHNIQUE: CTA of the chest was performed after the administration of intravenous contrast, with and without IV contrast. Multiplanar reformatted images are provided for review. MIP images are provided for review. Automated exposure control, iterative reconstruction, and/or weight based adjustment of the mA/kV was utilized to reduce the radiation dose to as low as reasonably achievable. 75mL of ioheixol (OMNIPAQUE ) 350 MG/ML injection was used. COMPARISON: Comparison is made to 1 view chest x-ray on 05/18/2024 and CT chest with contrast on 12/17/2023. CLINICAL HISTORY: Pulmonary embolism (PE) suspected, low to  intermediate probability, positive D-dimer. The patient has a personal history of non-small cell lung cancer. The patient was out walking and was smoking a cigarette when he started to have increasing shortness of breath [facility]). The patient wears 5L nasal cannula ([place]) at baseline but was not wearing his oxygen  on his walk. The patient reports having chest pain, leg swelling, and SHOB for a year. FINDINGS: PULMONARY ARTERIES: Pulmonary arteries are adequately opacified for evaluation. No pulmonary embolism. Main pulmonary artery is normal in caliber. MEDIASTINUM: The heart and pericardium demonstrate no acute abnormality. There is no acute abnormality of the thoracic aorta. LYMPH NODES: No mediastinal, hilar or axillary lymphadenopathy. LUNGS AND PLEURA: The lungs show centrilobular emphysema. Post treatment changes in the right upper lobe and right hilum are stable. Diffuse peribronchial micronodularity is now present. A new pleural-based nodule is present in the posteromedial left lower lobe that  measures 13 x 11 mm. No pleural effusion or pneumothorax. UPPER ABDOMEN: A left adrenal nodule is stable. SOFT TISSUES AND BONES: No acute bone or soft tissue abnormality. IMPRESSION: 1. No pulmonary embolism. 2. Diffuse peribronchial micronodular pattern reflects an acute inflammatory or infectious process. 3. New 13 x 11 mm pleural-based nodule in the posteromedial left lower lobe. This may represent rounded atelectasis. 4. Stable post treatment changes in the right upper lobe and right hilum. Electronically signed by: Audree Leas MD 05/18/2024 04:35 AM EDT RP Workstation: [id number]   DG Chest Portable 1 View Result Date: 05/18/2024 CLINICAL DATA:  Dyspnea EXAM: PORTABLE CHEST 1 VIEW COMPARISON:  None Available. FINDINGS: Masslike consolidation within the right hilum reflects post radiation changes better appreciated on CT examination of 12/17/2023. Parenchymal scarring within the right mid lung zone. Minimal  left basilar atelectasis or infiltrate. Lungs are otherwise clear. No pneumothorax or pleural effusion. Cardiac size is at the upper limits of normal. Pulmonary vascularity is normal. No acute bone. IMPRESSION: 1. Minimal left basilar atelectasis or infiltrate. 2. Post radiation changes within the right hilum. Electronically Signed   By: Worthy Heads M.D.   On: 05/18/2024 01:15     Subjective: Patient seen examined bedside, sitting edge of bed.  Eating breakfast.  Once again a large bag of chips present at bedside.  Discussed once again needs to adhere to a sodium restricted diet that this likely complicates this picture.  Breathing back to baseline, on his home oxygen  regimen.  Discussed will start once daily Lasix .  Referred to cardiology outpatient for further management.  No other specific questions, concerns or complaints at this time.  Denies headache, no dizziness, no chest pain, no palpitations, no shortness of breath, no abdominal pain, no focal weakness, no fatigue, no paresthesia.  No acute events overnight per nurse staff.  Discharge Exam: Vitals:   05/22/24 0827 05/22/24 0858  BP:  121/89  Pulse:    Resp:    Temp:    SpO2: 90%    Vitals:   05/22/24 0502 05/22/24 0801 05/22/24 0827 05/22/24 0858  BP: 117/80 121/89  121/89  Pulse: 84 94    Resp: 16 16    Temp: 97.9 F (36.6 C) 97.6 F (36.4 C)    TempSrc: Oral Oral    SpO2: 96% (!) 85% 90%   Weight:      Height:        Physical Exam: GEN: NAD, alert and oriented x 3, chronically ill appearance, appears older than stated age HEENT: NCAT, PERRL, EOMI, sclera clear, MMM PULM: Breath sounds diminished bilateral bases; no wheezing/crackles, normal respiratory effort without accessory muscle use, on 5 L nasal cannula with SpO2 97% at rest (baseline 4-5L) CV: RRR w/o M/G/R GI: abd soft, NTND, NABS, no R/G/M MSK: Trace bilateral lower extremity peripheral edema, moves all extremities independently NEURO: CN II-XII intact, no  focal deficits, sensation to light touch intact PSYCH: normal mood/affect Integumentary: dry/intact, no rashes or wounds    The results of significant diagnostics from this hospitalization (including imaging, microbiology, ancillary and laboratory) are listed below for reference.     Microbiology: Recent Results (from the past 240 hours)  Blood culture (routine x 2)     Status: None (Preliminary result)   Collection Time: 05/18/24 12:05 AM   Specimen: BLOOD LEFT FOREARM  Result Value Ref Range Status   Specimen Description BLOOD LEFT FOREARM  Final   Special Requests   Final    BOTTLES DRAWN AEROBIC  AND ANAEROBIC Blood Culture results may not be optimal due to an inadequate volume of blood received in culture bottles   Culture   Final    NO GROWTH 3 DAYS Performed at [facility], [address] [place], [place] [address]    Report Status PENDING  Incomplete  Blood culture (routine x 2)     Status: None (Preliminary result)   Collection Time: 05/19/24 12:00 AM   Specimen: BLOOD  Result Value Ref Range Status   Specimen Description BLOOD LEFT ANTECUBITAL  Final   Special Requests   Final    BOTTLES DRAWN AEROBIC AND ANAEROBIC Blood Culture adequate volume   Culture   Final    NO GROWTH 3 DAYS Performed at [facility], [address], [place], [place] [address]    Report Status PENDING  Incomplete  Resp panel by RT-PCR (RSV, Flu A&B, Covid) Anterior Nasal Swab     Status: None   Collection Time: 05/19/24  1:50 AM   Specimen: Anterior Nasal Swab  Result Value Ref Range Status   SARS Coronavirus 2 by RT PCR NEGATIVE NEGATIVE Final   Influenza A by PCR NEGATIVE NEGATIVE Final   Influenza B by PCR NEGATIVE NEGATIVE Final    Comment: (NOTE) The Xpert Xpress SARS-CoV-2/FLU/RSV plus assay is intended as an aid in the diagnosis of influenza from Nasopharyngeal swab specimens and should not be used as a sole basis for treatment. Nasal washings and aspirates are  unacceptable for Xpert Xpress SARS-CoV-2/FLU/RSV testing.  Fact Sheet for Patients: BloggerCourse.com  Fact Sheet for Healthcare Providers: SeriousBroker.it  This test is not yet approved or cleared by the [place]  FDA and has been authorized for detection and/or diagnosis of SARS-CoV-2 by FDA under an Emergency Use Authorization (EUA). This EUA will remain in effect (meaning this test can be used) for the duration of the COVID-19 declaration under Section 564(b)(1) of the Act, 21 U.S.C. section 360bbb-3(b)(1), unless the authorization is terminated or revoked.     Resp Syncytial Virus by PCR NEGATIVE NEGATIVE Final    Comment: (NOTE) Fact Sheet for Patients: BloggerCourse.com  Fact Sheet for Healthcare Providers: SeriousBroker.it  This test is not yet approved or cleared by the [place]  FDA and has been authorized for detection and/or diagnosis of SARS-CoV-2 by FDA under an Emergency Use Authorization (EUA). This EUA will remain in effect (meaning this test can be used) for the duration of the COVID-19 declaration under Section 564(b)(1) of the Act, 21 U.S.C. section 360bbb-3(b)(1), unless the authorization is terminated or revoked.  Performed at [facility], [address] [place], [place] [address]      Labs: BNP (last 3 results) Recent Labs    05/20/24 0538 05/21/24 0511 05/22/24 0610  BNP 258.0* 158.5* 150.8*   Basic Metabolic Panel: Recent Labs  Lab 05/18/24 2324 05/19/24 0619 05/20/24 0538 05/21/24 0511 05/22/24 0610  NA 144 141 142 138 139  K 3.9 4.1 4.1 3.8 3.7  CL 107 110 110 101 102  CO2 26 23 22 23 23   GLUCOSE 98 94 105* 117* 92  BUN 9 11 7 8 14   CREATININE 1.17 0.84 0.86 0.92 1.02  CALCIUM 9.0 8.4* 8.6* 8.7* 8.9  MG  --  1.8  --   --   --   PHOS  --  3.7  --   --   --    Liver Function Tests: Recent Labs  Lab  05/18/24 0046  AST 30  ALT 25  ALKPHOS 68  BILITOT 0.9  PROT 6.9  ALBUMIN 3.6   No results for input(s): "LIPASE", "AMYLASE" in the last 168 hours. No results for input(s): "AMMONIA" in the last 168 hours. CBC: Recent Labs  Lab 05/18/24 0046 05/18/24 2324 05/19/24 0619  WBC 7.5 6.2 6.9  NEUTROABS 6.4  --   --   HGB 18.2* 18.2* 16.4  HCT 52.5* 55.9* 49.2  MCV 97.8 97.6 97.0  PLT 246 268 254   Cardiac Enzymes: No results for input(s): "CKTOTAL", "CKMB", "CKMBINDEX", "TROPONINI" in the last 168 hours. BNP: Invalid input(s): "POCBNP" CBG: No results for input(s): "GLUCAP" in the last 168 hours. D-Dimer No results for input(s): "DDIMER" in the last 72 hours. Hgb A1c No results for input(s): "HGBA1C" in the last 72 hours. Lipid Profile No results for input(s): "CHOL", "HDL", "LDLCALC", "TRIG", "CHOLHDL", "LDLDIRECT" in the last 72 hours. Thyroid  function studies No results for input(s): "TSH", "T4TOTAL", "T3FREE", "THYROIDAB" in the last 72 hours.  Invalid input(s): "FREET3" Anemia work up No results for input(s): "VITAMINB12", "FOLATE", "FERRITIN", "TIBC", "IRON", "RETICCTPCT" in the last 72 hours. Urinalysis    Component Value Date/Time   COLORURINE STRAW (A) 04/01/2024 1835   APPEARANCEUR CLEAR 04/01/2024 1835   LABSPEC 1.005 04/01/2024 1835   PHURINE 5.0 04/01/2024 1835   GLUCOSEU NEGATIVE 04/01/2024 1835   HGBUR NEGATIVE 04/01/2024 1835   HGBUR negative 01/16/2011 1530   BILIRUBINUR NEGATIVE 04/01/2024 1835   KETONESUR NEGATIVE 04/01/2024 1835   PROTEINUR NEGATIVE 04/01/2024 1835   UROBILINOGEN 1.0 07/15/2015 2137   NITRITE NEGATIVE 04/01/2024 1835   LEUKOCYTESUR NEGATIVE 04/01/2024 1835   Sepsis Labs Recent Labs  Lab 05/18/24 0046 05/18/24 2324 05/19/24 0619  WBC 7.5 6.2 6.9   Microbiology Recent Results (from the past 240 hours)  Blood culture (routine x 2)     Status: None (Preliminary result)   Collection Time: 05/18/24 12:05 AM   Specimen:  BLOOD LEFT FOREARM  Result Value Ref Range Status   Specimen Description BLOOD LEFT FOREARM  Final   Special Requests   Final    BOTTLES DRAWN AEROBIC AND ANAEROBIC Blood Culture results may not be optimal due to an inadequate volume of blood received in culture bottles   Culture   Final    NO GROWTH 3 DAYS Performed at [facility], [address] [place], [place] [address]    Report Status PENDING  Incomplete  Blood culture (routine x 2)     Status: None (Preliminary result)   Collection Time: 05/19/24 12:00 AM   Specimen: BLOOD  Result Value Ref Range Status   Specimen Description BLOOD LEFT ANTECUBITAL  Final   Special Requests   Final    BOTTLES DRAWN AEROBIC AND ANAEROBIC Blood Culture adequate volume   Culture   Final    NO GROWTH 3 DAYS Performed at [facility], [address], [place], [place] [address]    Report Status PENDING  Incomplete  Resp panel by RT-PCR (RSV, Flu A&B, Covid) Anterior Nasal Swab     Status: None   Collection Time: 05/19/24  1:50 AM   Specimen: Anterior Nasal Swab  Result Value Ref Range Status   SARS Coronavirus 2 by RT PCR NEGATIVE NEGATIVE Final   Influenza A by PCR NEGATIVE NEGATIVE Final   Influenza B by PCR NEGATIVE NEGATIVE Final    Comment: (NOTE) The Xpert Xpress SARS-CoV-2/FLU/RSV plus assay is intended as an aid in the diagnosis of influenza from Nasopharyngeal  swab specimens and should not be used as a sole basis for treatment. Nasal washings and aspirates are unacceptable for Xpert Xpress SARS-CoV-2/FLU/RSV testing.  Fact Sheet for Patients: BloggerCourse.com  Fact Sheet for Healthcare Providers: SeriousBroker.it  This test is not yet approved or cleared by the [place]  FDA and has been authorized for detection and/or diagnosis of SARS-CoV-2 by FDA under an Emergency Use Authorization (EUA). This EUA will remain in effect (meaning this test can be used) for the  duration of the COVID-19 declaration under Section 564(b)(1) of the Act, 21 U.S.C. section 360bbb-3(b)(1), unless the authorization is terminated or revoked.     Resp Syncytial Virus by PCR NEGATIVE NEGATIVE Final    Comment: (NOTE) Fact Sheet for Patients: BloggerCourse.com  Fact Sheet for Healthcare Providers: SeriousBroker.it  This test is not yet approved or cleared by the [place]  FDA and has been authorized for detection and/or diagnosis of SARS-CoV-2 by FDA under an Emergency Use Authorization (EUA). This EUA will remain in effect (meaning this test can be used) for the duration of the COVID-19 declaration under Section 564(b)(1) of the Act, 21 U.S.C. section 360bbb-3(b)(1), unless the authorization is terminated or revoked.  Performed at [facility], [address] [place], [place] [address]      Time coordinating discharge: Over 30 minutes  SIGNED:   Rema Care [place], DO  [facility] 05/22/2024, 10:01 AM

## 2024-05-22 NOTE — Progress Notes (Incomplete)
 Nutrition Education Note  RD consulted for nutrition education regarding a Heart Healthy diet.   Lipid Panel     Component Value Date/Time   CHOL 107 05/19/2024 0619   TRIG 36 05/19/2024 0619   HDL 56 05/19/2024 0619   CHOLHDL 1.9 05/19/2024 0619   VLDL 7 05/19/2024 0619   LDLCALC 44 05/19/2024 0619    RD provided "Heart Healthy Nutrition Therapy" handout from the Academy of Nutrition and Dietetics. Reviewed patient's dietary recall. Provided examples on ways to decrease sodium and fat intake in diet. Discouraged intake of processed foods and use of salt shaker. Encouraged fresh fruits and vegetables as well as whole grain sources of carbohydrates to maximize fiber intake. Teach back method used.  Expect *** compliance.  Body mass index is 27.53 kg/m. Pt meets criteria for *** based on current BMI.  Current diet order is ***, patient is consuming approximately ***% of meals at this time. Labs and medications reviewed. No further nutrition interventions warranted at this time. RD contact information provided. If additional nutrition issues arise, please re-consult RD.  ***   Nutrition Education Note  RD consulted for nutrition education regarding new onset CHF.  RD provided "Low Sodium Nutrition Therapy" handout from the Academy of Nutrition and Dietetics. Reviewed patient's dietary recall. Provided examples on ways to decrease sodium intake in diet. Discouraged intake of processed foods and use of salt shaker. Encouraged fresh fruits and vegetables as well as whole grain sources of carbohydrates to maximize fiber intake.   RD discussed why it is important for patient to adhere to diet recommendations, and emphasized the role of fluids, foods to avoid, and importance of weighing self daily. Teach back method used.  Expect *** compliance.  Body mass index is 27.53 kg/m. Pt meets criteria for *** based on current BMI.  Current diet order is ***, patient is consuming approximately  ***% of meals at this time. Labs and medications reviewed. No further nutrition interventions warranted at this time. RD contact information provided. If additional nutrition issues arise, please re-consult RD.   ***  [email or web address]

## 2024-05-24 LAB — CULTURE, BLOOD (ROUTINE X 2)
Culture: NO GROWTH
Culture: NO GROWTH
Special Requests: ADEQUATE

## 2024-05-29 ENCOUNTER — Inpatient Hospital Stay: Admitting: Licensed Clinical Social Worker

## 2024-05-29 DIAGNOSIS — C3490 Malignant neoplasm of unspecified part of unspecified bronchus or lung: Secondary | ICD-10-CM

## 2024-05-30 NOTE — Progress Notes (Signed)
 [facility] CSW Progress Note  Clinical Child psychotherapist contacted patient by phone to check in upon receiving a letter requesting assistance w/ home services.  Pt reports he was recently discharged from the hospital and requires more assistance at home then is currently allotted through his Medicaid benefits.  At present pt states he receives 2 hrs per week.  CSW advised pt to contact the agency providing the HHA services to request a re-evaluation post discharge.  Pt also advised the request should go through his PCP to complete the medical form to increase PCA hours as pt has not seen Dr. Marguerita Shih since January.  CSW to remain available as appropriate.      Quenton Bruns, LCSW Clinical Social Worker [facility]

## 2024-06-09 ENCOUNTER — Telehealth: Payer: Self-pay | Admitting: Medical Oncology

## 2024-06-09 NOTE — Telephone Encounter (Signed)
 I called pt and he confirmed he will be at his appts on Monday 06/12/2024.  I asked him to repeat back appt date and time , and he did correctly . I also asked who was driving him. He said his wife was going to drive him.  CT done earlier this month.

## 2024-06-12 ENCOUNTER — Inpatient Hospital Stay

## 2024-06-12 ENCOUNTER — Inpatient Hospital Stay (HOSPITAL_BASED_OUTPATIENT_CLINIC_OR_DEPARTMENT_OTHER): Admitting: Internal Medicine

## 2024-06-12 VITALS — BP 141/106 | HR 103 | Temp 98.7°F | Resp 19 | Ht 69.0 in | Wt 189.3 lb

## 2024-06-12 DIAGNOSIS — C3490 Malignant neoplasm of unspecified part of unspecified bronchus or lung: Secondary | ICD-10-CM | POA: Diagnosis not present

## 2024-06-12 DIAGNOSIS — C7A1 Malignant poorly differentiated neuroendocrine tumors: Secondary | ICD-10-CM | POA: Diagnosis present

## 2024-06-12 LAB — CBC WITH DIFFERENTIAL (CANCER CENTER ONLY)
Abs Immature Granulocytes: 0.02 10*3/uL (ref 0.00–0.07)
Basophils Absolute: 0 10*3/uL (ref 0.0–0.1)
Basophils Relative: 1 %
Eosinophils Absolute: 0.4 10*3/uL (ref 0.0–0.5)
Eosinophils Relative: 6 %
HCT: 49.6 % (ref 39.0–52.0)
Hemoglobin: 16.8 g/dL (ref 13.0–17.0)
Immature Granulocytes: 0 %
Lymphocytes Relative: 10 %
Lymphs Abs: 0.6 10*3/uL — ABNORMAL LOW (ref 0.7–4.0)
MCH: 32 pg (ref 26.0–34.0)
MCHC: 33.9 g/dL (ref 30.0–36.0)
MCV: 94.5 fL (ref 80.0–100.0)
Monocytes Absolute: 0.6 10*3/uL (ref 0.1–1.0)
Monocytes Relative: 11 %
Neutro Abs: 4.4 10*3/uL (ref 1.7–7.7)
Neutrophils Relative %: 72 %
Platelet Count: 237 10*3/uL (ref 150–400)
RBC: 5.25 MIL/uL (ref 4.22–5.81)
RDW: 14.7 % (ref 11.5–15.5)
WBC Count: 6.1 10*3/uL (ref 4.0–10.5)
nRBC: 0 % (ref 0.0–0.2)

## 2024-06-12 LAB — CMP (CANCER CENTER ONLY)
ALT: 10 U/L (ref 0–44)
AST: 14 U/L — ABNORMAL LOW (ref 15–41)
Albumin: 4.1 g/dL (ref 3.5–5.0)
Alkaline Phosphatase: 79 U/L (ref 38–126)
Anion gap: 8 (ref 5–15)
BUN: 14 mg/dL (ref 6–20)
CO2: 26 mmol/L (ref 22–32)
Calcium: 9.3 mg/dL (ref 8.9–10.3)
Chloride: 109 mmol/L (ref 98–111)
Creatinine: 1.3 mg/dL — ABNORMAL HIGH (ref 0.61–1.24)
GFR, Estimated: >60 mL/min (ref >60)
Glucose, Bld: 91 mg/dL (ref 70–99)
Potassium: 3.9 mmol/L (ref 3.5–5.1)
Sodium: 143 mmol/L (ref 135–145)
Total Bilirubin: 0.5 mg/dL (ref 0.0–1.2)
Total Protein: 7.2 g/dL (ref 6.5–8.1)

## 2024-06-12 NOTE — Progress Notes (Signed)
 [facility] [phone]   [phone]  OFFICE PROGRESS NOTE  Sim Emery CROME, MD [address] [place] [address]  DIAGNOSIS: stage IIIb (T4, N2, M0) non-small cell lung cancer, poorly differentiated carcinoma with neuroendocrine features diagnosed in January 2024 and presented with large right lower lobe lung mass in addition to right hilar and mediastinal lymphadenopathy.  PRIOR THERAPY:   1) status post short course of palliative radiotherapy with 30 Gray completed in March 2023.  2) A course of concurrent chemoradiation with weekly carboplatin  for AUC of 2 and paclitaxel  45 Mg/M2.  Status post 4 cycles. 3) Consolidation treatment with immunotherapy with Imfinzi  1500 Mg IV every 4 weeks.  First dose 04/29/2023.  Status post 3 cycles.  Patient inconsistent with compliance with his appointments and treatments.  Therefore his treatment was discontinued due to not receiving this on a regular basis   CURRENT THERAPY: Observation    INTERVAL HISTORY: Richard [name] 53 y.o. male returns to the clinic today for follow-up visit accompanied by his wife.Discussed the use of AI scribe software for clinical note transcription with the patient, who gave verbal consent to proceed.  History of Present Illness   Richard [name] is a 53 year old male with stage 3B non-small cell lung cancer who presents for evaluation after a recent CT scan.  He was diagnosed with stage 3B non-small cell lung cancer with neuroendocrine features in January 2024. He underwent concurrent chemoradiation followed by consolidation treatment with immunotherapy using Imfinzi  every four weeks. This treatment was discontinued after three cycles due to missed appointments. He is currently under observation.  A recent CT scan of the chest was performed over three weeks ago. He has difficulty with transportation to appointments, contributing to noncompliance. His wife is unable to take  time off work to assist with transportation, and he has had issues with the transportation service not being available when needed.  He visited the emergency room a few weeks ago due to breathing difficulties and was informed of fluid around his heart. He recalls being diagnosed with a condition similar to pneumonia and mentions having COPD. He has ongoing breathing difficulties, which he attributes to his COPD.  He is not currently seeing a pulmonologist but receives care from a doctor at [facility], whom he identifies as his family doctor.  He reports ongoing breathing difficulties. No other symptoms were mentioned during the review of systems.        MEDICAL HISTORY: Past Medical History:  Diagnosis Date   Asthma    Brain bleed (HCC)    DVT (deep venous thrombosis) (HCC) 02/19/2015   RLE   GERD (gastroesophageal reflux disease)    History of home oxygen  therapy    Hypertension    Seizures (HCC)    last episode 03/2013   Stroke [facility])     ALLERGIES:  is allergic to dilaudid  [hydromorphone  hcl] and tape.  MEDICATIONS:  Current Outpatient Medications  Medication Sig Dispense Refill   albuterol  (PROVENTIL ) (2.5 MG/3ML) 0.083% nebulizer solution Take 2.5 mg by nebulization 3 (three) times daily as needed for shortness of breath or wheezing.     albuterol  (VENTOLIN  HFA) 108 (90 Base) MCG/ACT inhaler Inhale 2 puffs into the lungs every 6 (six) hours as needed for wheezing or shortness of breath. 1 each 11   amLODipine  (NORVASC ) 10 MG tablet Take 10 mg by mouth every morning.     Aspirin -Acetaminophen  (GOODYS BODY PAIN PO)  Take 1 packet by mouth daily as needed (for pain).     cloNIDine  (CATAPRES ) 0.1 MG tablet Take 0.1 mg by mouth. Take 2 tablets by mouth at 9am and 5pm and 1 tablet at bedtime     Fluticasone -Umeclidin-Vilant (TRELEGY ELLIPTA ) 200-62.5-25 MCG/ACT AEPB Inhale 1 puff into the lungs daily. 60 each 11   furosemide  (LASIX ) 40 MG tablet Take 1 tablet (40 mg total) by mouth  daily. 90 tablet 0   hydrochlorothiazide  (HYDRODIURIL ) 12.5 MG tablet Take 12.5 mg by mouth every morning.     levETIRAcetam  (KEPPRA ) 500 MG tablet Take 1 tablet (500 mg total) by mouth 2 (two) times daily.     losartan  (COZAAR ) 100 MG tablet Take 100 mg by mouth daily.     sertraline  (ZOLOFT ) 25 MG tablet Take 25 mg by mouth daily.     No current facility-administered medications for this visit.    SURGICAL HISTORY:  Past Surgical History:  Procedure Laterality Date   BRONCHIAL BIOPSY  02/10/2022   Procedure: BRONCHIAL BIOPSIES;  Surgeon: Jude Harden GAILS, MD;  Location: [facility];  Service: Cardiopulmonary;;   BRONCHIAL BRUSHINGS  02/10/2022   Procedure: BRONCHIAL BRUSHINGS;  Surgeon: Jude Harden GAILS, MD;  Location: [facility];  Service: Cardiopulmonary;;   BRONCHIAL NEEDLE ASPIRATION BIOPSY  02/10/2022   Procedure: BRONCHIAL NEEDLE ASPIRATION BIOPSIES;  Surgeon: Jude Harden GAILS, MD;  Location: [facility];  Service: Cardiopulmonary;;   BRONCHIAL WASHINGS  02/10/2022   Procedure: BRONCHIAL WASHINGS;  Surgeon: Jude Harden GAILS, MD;  Location: [facility];  Service: Cardiopulmonary;;   ENDOBRONCHIAL ULTRASOUND Bilateral 02/10/2022   Procedure: ENDOBRONCHIAL ULTRASOUND;  Surgeon: Jude Harden GAILS, MD;  Location: [facility];  Service: Cardiopulmonary;  Laterality: Bilateral;   LEG SURGERY     VIDEO BRONCHOSCOPY  02/10/2022   Procedure: VIDEO BRONCHOSCOPY WITHOUT FLUORO;  Surgeon: Jude Harden GAILS, MD;  Location: [facility];  Service: Cardiopulmonary;;    REVIEW OF SYSTEMS:  Constitutional: positive for fatigue Eyes: negative Ears, nose, mouth, throat, and face: negative Respiratory: positive for dyspnea on exertion Cardiovascular: negative Gastrointestinal: negative Genitourinary:negative Integument/breast: negative Hematologic/lymphatic: negative Musculoskeletal:negative Neurological: negative Behavioral/Psych: negative Endocrine: negative Allergic/Immunologic: negative   PHYSICAL  EXAMINATION: General appearance: alert, cooperative, fatigued, and no distress Head: Normocephalic, without obvious abnormality, atraumatic Neck: no adenopathy, no JVD, supple, symmetrical, trachea midline, and thyroid  not enlarged, symmetric, no tenderness/mass/nodules Lymph nodes: Cervical, supraclavicular, and axillary nodes normal. Resp: clear to auscultation bilaterally Back: symmetric, no curvature. ROM normal. No CVA tenderness. Cardio: regular rate and rhythm, S1, S2 normal, no murmur, click, rub or gallop GI: soft, non-tender; bowel sounds normal; no masses,  no organomegaly Extremities: extremities normal, atraumatic, no cyanosis or edema Neurologic: Alert and oriented X 3, normal strength and tone. Normal symmetric reflexes. Normal coordination and gait  ECOG PERFORMANCE STATUS: 1 - Symptomatic but completely ambulatory  Blood pressure (!) 141/106, pulse (!) 103, temperature 98.7 F (37.1 C), temperature source Temporal, resp. rate 19, height 5' 9 (1.753 m), weight 189 lb 4.8 oz (85.9 kg), SpO2 (!) 80%.  LABORATORY DATA: Lab Results  Component Value Date   WBC 6.9 05/19/2024   HGB 16.4 05/19/2024   HCT 49.2 05/19/2024   MCV 97.0 05/19/2024   PLT 254 05/19/2024      Chemistry      Component Value Date/Time   NA 139 05/22/2024 0610   K 3.7 05/22/2024 0610   CL 102 05/22/2024 0610   CO2 23 05/22/2024 0610   BUN 14 05/22/2024 0610  CREATININE 1.02 05/22/2024 0610   CREATININE 1.00 01/12/2024 1105   CREATININE 0.91 12/05/2013 1605      Component Value Date/Time   CALCIUM 8.9 05/22/2024 0610   ALKPHOS 68 05/18/2024 0046   AST 30 05/18/2024 0046   AST 19 01/12/2024 1105   ALT 25 05/18/2024 0046   ALT 11 01/12/2024 1105   BILITOT 0.9 05/18/2024 0046   BILITOT 0.7 01/12/2024 1105       RADIOGRAPHIC STUDIES: ECHOCARDIOGRAM COMPLETE Result Date: 05/19/2024    ECHOCARDIOGRAM REPORT   Patient Name:   BARLOW HARRISON Date of Exam: 05/19/2024 Medical Rec #:  [id number]     Height:       69.0 in Accession #:    [id number]   Weight:       224.9 lb Date of Birth:  [birth year]    BSA:          2.172 m Patient Age:    52 years     BP:           141/109 mmHg Patient Gender: M            HR:           87 bpm. Exam Location:  Inpatient Procedure: 2D Echo, Cardiac Doppler and Color Doppler (Both Spectral and Color            Flow Doppler were utilized during procedure). Indications:    Elevated Troponin  History:        Patient has prior history of Echocardiogram examinations, most                 recent 12/26/2022. TIA, COPD, Stroke and Migraine,                 Signs/Symptoms:Chest Pain and Syncope; Risk Factors:Hypertension                 and Current Smoker. DVT.  Sonographer:    Thea Norlander RCS Referring Phys: [id number] JONATHAN SEGARS IMPRESSIONS  1. Left ventricular ejection fraction, by estimation, is 55 to 60%. The left ventricle has normal function. The left ventricle has no regional wall motion abnormalities. There is mild left ventricular hypertrophy. Left ventricular diastolic parameters are consistent with Grade I diastolic dysfunction (impaired relaxation).  2. Right ventricular systolic function is normal. The right ventricular size is normal.  3. The mitral valve is normal in structure. No evidence of mitral valve regurgitation. No evidence of mitral stenosis.  4. The aortic valve is tricuspid. Aortic valve regurgitation is not visualized. No aortic stenosis is present.  5. Aortic dilatation noted. There is moderate dilatation of the aortic root, measuring 42 mm. There is moderate dilatation of the ascending aorta, measuring 43 mm.  6. The inferior vena cava is normal in size with greater than 50% respiratory variability, suggesting right atrial pressure of 3 mmHg. FINDINGS  Left Ventricle: Left ventricular ejection fraction, by estimation, is 55 to 60%. The left ventricle has normal function. The left ventricle has no regional wall motion abnormalities. Strain was performed  and the global longitudinal strain is indeterminate. The left ventricular internal cavity size was normal in size. There is mild left ventricular hypertrophy. Left ventricular diastolic parameters are consistent with Grade I diastolic dysfunction (impaired relaxation). Right Ventricle: The right ventricular size is normal. No increase in right ventricular wall thickness. Right ventricular systolic function is normal. Left Atrium: Left atrial size was normal in size. Right Atrium: Right atrial size was normal in size. Pericardium:  Trivial pericardial effusion is present. The pericardial effusion is posterior to the left ventricle. Mitral Valve: The mitral valve is normal in structure. No evidence of mitral valve regurgitation. No evidence of mitral valve stenosis. Tricuspid Valve: The tricuspid valve is normal in structure. Tricuspid valve regurgitation is trivial. No evidence of tricuspid stenosis. Aortic Valve: The aortic valve is tricuspid. Aortic valve regurgitation is not visualized. No aortic stenosis is present. Aortic valve peak gradient measures 5.1 mmHg. Pulmonic Valve: The pulmonic valve was normal in structure. Pulmonic valve regurgitation is trivial. No evidence of pulmonic stenosis. Aorta: Aortic dilatation noted. There is moderate dilatation of the aortic root, measuring 42 mm. There is moderate dilatation of the ascending aorta, measuring 43 mm. Venous: The inferior vena cava is normal in size with greater than 50% respiratory variability, suggesting right atrial pressure of 3 mmHg. IAS/Shunts: No atrial level shunt detected by color flow Doppler. Additional Comments: 3D was performed not requiring image post processing on an independent workstation and was indeterminate.  LEFT VENTRICLE PLAX 2D LVIDd:         4.60 cm   Diastology LVIDs:         3.20 cm   LV e' medial:    6.42 cm/s LV PW:         1.40 cm   LV E/e' medial:  12.1 LV IVS:        1.30 cm   LV e' lateral:   11.70 cm/s LVOT diam:     2.40  cm   LV E/e' lateral: 6.6 LV SV:         86 LV SV Index:   40 LVOT Area:     4.52 cm  RIGHT VENTRICLE             IVC RV S prime:     16.00 cm/s  IVC diam: 2.20 cm TAPSE (M-mode): 2.4 cm LEFT ATRIUM             Index        RIGHT ATRIUM           Index LA diam:        3.80 cm 1.75 cm/m   RA Area:     15.60 cm LA Vol (A2C):   61.5 ml 28.32 ml/m  RA Volume:   42.50 ml  19.57 ml/m LA Vol (A4C):   32.3 ml 14.87 ml/m LA Biplane Vol: 45.9 ml 21.14 ml/m  AORTIC VALVE AV Area (Vmax): 4.66 cm AV Vmax:        112.50 cm/s AV Peak Grad:   5.1 mmHg LVOT Vmax:      116.00 cm/s LVOT Vmean:     79.700 cm/s LVOT VTI:       0.190 m  AORTA Ao Root diam: 4.20 cm Ao Asc diam:  4.30 cm MITRAL VALVE MV Area (PHT): 5.54 cm     SHUNTS MV Decel Time: 137 msec     Systemic VTI:  0.19 m MV E velocity: 77.60 cm/s   Systemic Diam: 2.40 cm MV A velocity: 102.00 cm/s MV E/A ratio:  0.76 Maude Emmer MD Electronically signed by Maude Emmer MD Signature Date/Time: 05/19/2024/5:23:17 PM    Final    DG Chest 2 View Result Date: 05/18/2024 CLINICAL DATA:  Chest pain EXAM: CHEST - 2 VIEW COMPARISON:  Chest x-ray 05/18/2024.  CT of the chest 05/18/2024. FINDINGS: The right hilum appears enlarged and there is right perihilar airspace opacities similar to the prior study. Scarring in  the right upper lobe is again noted. Scattered interstitial opacities in the left lower lung are unchanged. There is a small right pleural effusion. There is no pneumothorax. The cardiac silhouette is within normal limits. No acute fractures are seen. IMPRESSION: 1. Stable enlarged right hilum with right perihilar airspace opacities. 2. Small right pleural effusion. Electronically Signed   By: Greig Pique M.D.   On: 05/18/2024 23:22   CT Angio Chest PE W and/or Wo Contrast Result Date: 05/18/2024 EXAM: CTA of the Chest with contrast for PE 05/18/2024 04:17:28 AM TECHNIQUE: CTA of the chest was performed after the administration of intravenous contrast, with and  without IV contrast. Multiplanar reformatted images are provided for review. MIP images are provided for review. Automated exposure control, iterative reconstruction, and/or weight based adjustment of the mA/kV was utilized to reduce the radiation dose to as low as reasonably achievable. 75mL of ioheixol (OMNIPAQUE ) 350 MG/ML injection was used. COMPARISON: Comparison is made to 1 view chest x-ray on 05/18/2024 and CT chest with contrast on 12/17/2023. CLINICAL HISTORY: Pulmonary embolism (PE) suspected, low to intermediate probability, positive D-dimer. The patient has a personal history of non-small cell lung cancer. The patient was out walking and was smoking a cigarette when he started to have increasing shortness of breath [facility]). The patient wears 5L nasal cannula ([place]) at baseline but was not wearing his oxygen  on his walk. The patient reports having chest pain, leg swelling, and SHOB for a year. FINDINGS: PULMONARY ARTERIES: Pulmonary arteries are adequately opacified for evaluation. No pulmonary embolism. Main pulmonary artery is normal in caliber. MEDIASTINUM: The heart and pericardium demonstrate no acute abnormality. There is no acute abnormality of the thoracic aorta. LYMPH NODES: No mediastinal, hilar or axillary lymphadenopathy. LUNGS AND PLEURA: The lungs show centrilobular emphysema. Post treatment changes in the right upper lobe and right hilum are stable. Diffuse peribronchial micronodularity is now present. A new pleural-based nodule is present in the posteromedial left lower lobe that measures 13 x 11 mm. No pleural effusion or pneumothorax. UPPER ABDOMEN: A left adrenal nodule is stable. SOFT TISSUES AND BONES: No acute bone or soft tissue abnormality. IMPRESSION: 1. No pulmonary embolism. 2. Diffuse peribronchial micronodular pattern reflects an acute inflammatory or infectious process. 3. New 13 x 11 mm pleural-based nodule in the posteromedial left lower lobe. This may represent rounded  atelectasis. 4. Stable post treatment changes in the right upper lobe and right hilum. Electronically signed by: Lonni Necessary MD 05/18/2024 04:35 AM EDT RP Workstation: [id number]   DG Chest Portable 1 View Result Date: 05/18/2024 CLINICAL DATA:  Dyspnea EXAM: PORTABLE CHEST 1 VIEW COMPARISON:  None Available. FINDINGS: Masslike consolidation within the right hilum reflects post radiation changes better appreciated on CT examination of 12/17/2023. Parenchymal scarring within the right mid lung zone. Minimal left basilar atelectasis or infiltrate. Lungs are otherwise clear. No pneumothorax or pleural effusion. Cardiac size is at the upper limits of normal. Pulmonary vascularity is normal. No acute bone. IMPRESSION: 1. Minimal left basilar atelectasis or infiltrate. 2. Post radiation changes within the right hilum. Electronically Signed   By: Dorethia Molt M.D.   On: 05/18/2024 01:15    ASSESSMENT AND PLAN: This is a very pleasant 53 years old African-American male with stage IIIb (T4, N2, M0) non-small cell lung cancer, poorly differentiated carcinoma with neuroendocrine features diagnosed in January 2024 and presented with large right lower lobe lung mass in addition to right hilar and mediastinal lymphadenopathy.  He is status post short  course of palliative radiotherapy with 30 [place] completed in March 2023. He completed concurrent chemoradiation with weekly carboplatin  for AUC of 2 1 paclitaxel  45 Mg/M2 status post 4 cycles.  He was noncompliant with his chemotherapy treatments and received only 3 out of the schedule 7 cycles.  Last cycle was given on March 15, 2023. His scan showed decrease in the size of the right upper lung retrohilar mass as well as decrease in the size of the AP window lymph nodes and no evidence for progression. The patient is currently on treatment with consolidation immunotherapy with Imfinzi  1500 Mg IV every 4 weeks.  He is status post 3 cycle. The patient has been off  treatment since June 2024 because he missed a lot of appointments.  His treatment was discontinued secondary to noncompliance. He is currently on observation He had a recent CT scan of the chest performed at the emergency department and it showed no evidence for disease recurrence or metastasis except for new 1.3 x 1.1 cm pleural-based nodule in the posterior medial left lower lobe suspicious for rounded atelectasis. Assessment and Plan    Stage 3B non-small cell lung cancer Stage 3B non-small cell lung cancer with differentiated carcinoma and neuroendocrine features, diagnosed in January 2024. Status post concurrent chemoradiation and consolidation treatment with immunotherapy (Imfinzi ), discontinued due to noncompliance. Currently under observation. Recent CT scan shows a subpleural area in the left lung, potentially indicating inflammation or cancer recurrence. Further evaluation is required. - Schedule a follow-up CT scan in three months to evaluate the left lung subpleural area. - Ensure the CT scan is completed one week prior to the follow-up appointment. - Instruct to call if any new symptoms or concerns arise before the next scheduled appointment.  Chronic obstructive pulmonary disease (COPD) COPD with reported breathing difficulties. No current pulmonologist follow-up, only seen by a family doctor at [facility]. - Encourage follow-up with a pulmonologist for COPD management.   The patient was advised to call immediately if he has any concerning symptoms in the interval. The patient voices understanding of current disease status and treatment options and is in agreement with the current care plan.  All questions were answered. The patient knows to call the clinic with any problems, questions or concerns. We can certainly see the patient much sooner if necessary.  The total time spent in the appointment was 30 minutes.  Disclaimer: This note was dictated with voice recognition software.  Similar sounding words can inadvertently be transcribed and may not be corrected upon review.

## 2024-06-15 ENCOUNTER — Encounter (HOSPITAL_COMMUNITY): Payer: Self-pay | Admitting: Emergency Medicine

## 2024-06-15 ENCOUNTER — Emergency Department (HOSPITAL_COMMUNITY)
Admission: EM | Admit: 2024-06-15 | Discharge: 2024-06-15 | Disposition: A | Discharge status: Home / Self Care | Attending: Student | Admitting: Student

## 2024-06-15 ENCOUNTER — Other Ambulatory Visit: Payer: Self-pay

## 2024-06-15 ENCOUNTER — Emergency Department (HOSPITAL_COMMUNITY)

## 2024-06-15 DIAGNOSIS — Z85118 Personal history of other malignant neoplasm of bronchus and lung: Secondary | ICD-10-CM | POA: Insufficient documentation

## 2024-06-15 DIAGNOSIS — Z87891 Personal history of nicotine dependence: Secondary | ICD-10-CM | POA: Diagnosis not present

## 2024-06-15 DIAGNOSIS — R0789 Other chest pain: Secondary | ICD-10-CM | POA: Diagnosis not present

## 2024-06-15 DIAGNOSIS — Z7982 Long term (current) use of aspirin: Secondary | ICD-10-CM | POA: Diagnosis not present

## 2024-06-15 DIAGNOSIS — J441 Chronic obstructive pulmonary disease with (acute) exacerbation: Secondary | ICD-10-CM | POA: Insufficient documentation

## 2024-06-15 DIAGNOSIS — Z8673 Personal history of transient ischemic attack (TIA), and cerebral infarction without residual deficits: Secondary | ICD-10-CM | POA: Insufficient documentation

## 2024-06-15 DIAGNOSIS — R0602 Shortness of breath: Secondary | ICD-10-CM | POA: Diagnosis present

## 2024-06-15 LAB — CBC WITH DIFFERENTIAL/PLATELET
Abs Immature Granulocytes: 0.02 10*3/uL (ref 0.00–0.07)
Basophils Absolute: 0 10*3/uL (ref 0.0–0.1)
Basophils Relative: 0 %
Eosinophils Absolute: 0 10*3/uL (ref 0.0–0.5)
Eosinophils Relative: 0 %
HCT: 50.4 % (ref 39.0–52.0)
Hemoglobin: 16.6 g/dL (ref 13.0–17.0)
Immature Granulocytes: 0 %
Lymphocytes Relative: 7 %
Lymphs Abs: 0.5 10*3/uL — ABNORMAL LOW (ref 0.7–4.0)
MCH: 32.2 pg (ref 26.0–34.0)
MCHC: 32.9 g/dL (ref 30.0–36.0)
MCV: 97.7 fL (ref 80.0–100.0)
Monocytes Absolute: 0.7 10*3/uL (ref 0.1–1.0)
Monocytes Relative: 10 %
Neutro Abs: 6 10*3/uL (ref 1.7–7.7)
Neutrophils Relative %: 83 %
Platelets: 222 10*3/uL (ref 150–400)
RBC: 5.16 MIL/uL (ref 4.22–5.81)
RDW: 14.6 % (ref 11.5–15.5)
WBC: 7.3 10*3/uL (ref 4.0–10.5)
nRBC: 0 % (ref 0.0–0.2)

## 2024-06-15 LAB — COMPREHENSIVE METABOLIC PANEL WITH GFR
ALT: 13 U/L (ref 0–44)
AST: 18 U/L (ref 15–41)
Albumin: 3.3 g/dL — ABNORMAL LOW (ref 3.5–5.0)
Alkaline Phosphatase: 69 U/L (ref 38–126)
Anion gap: 11 (ref 5–15)
BUN: 12 mg/dL (ref 6–20)
CO2: 22 mmol/L (ref 22–32)
Calcium: 8.7 mg/dL — ABNORMAL LOW (ref 8.9–10.3)
Chloride: 105 mmol/L (ref 98–111)
Creatinine, Ser: 1.02 mg/dL (ref 0.61–1.24)
GFR, Estimated: >60 mL/min (ref >60)
Glucose, Bld: 83 mg/dL (ref 70–99)
Potassium: 4.5 mmol/L (ref 3.5–5.1)
Sodium: 138 mmol/L (ref 135–145)
Total Bilirubin: 0.9 mg/dL (ref 0.0–1.2)
Total Protein: 6.6 g/dL (ref 6.5–8.1)

## 2024-06-15 LAB — TROPONIN I (HIGH SENSITIVITY)
Troponin I (High Sensitivity): 18 ng/L — ABNORMAL HIGH (ref <18)
Troponin I (High Sensitivity): 17 ng/L (ref <18)

## 2024-06-15 LAB — BRAIN NATRIURETIC PEPTIDE: B Natriuretic Peptide: 145 pg/mL — ABNORMAL HIGH (ref 0.0–100.0)

## 2024-06-15 MED ORDER — IOHEXOL 350 MG/ML SOLN
75.0000 mL | Freq: Once | INTRAVENOUS | Status: AC | PRN
  Administered 2024-06-15: 75 mL via INTRAVENOUS

## 2024-06-15 MED ORDER — PREDNISONE 50 MG PO TABS: 50.0000 mg | ORAL_TABLET | Freq: Every day | ORAL | 0 refills | Status: DC

## 2024-06-15 MED ORDER — PREDNISONE 20 MG PO TABS
60.0000 mg | ORAL_TABLET | Freq: Once | ORAL | Status: AC
  Administered 2024-06-15: 60 mg via ORAL
  Filled 2024-06-15: qty 3

## 2024-06-15 MED ORDER — DOXYCYCLINE HYCLATE 100 MG PO CAPS
100.0000 mg | ORAL_CAPSULE | Freq: Two times a day (BID) | ORAL | 0 refills | Status: AC
Start: 2024-06-15 — End: 2024-06-20

## 2024-06-15 MED ORDER — IPRATROPIUM-ALBUTEROL 0.5-2.5 (3) MG/3ML IN SOLN
9.0000 mL | Freq: Once | RESPIRATORY_TRACT | Status: AC
  Administered 2024-06-15: 9 mL via RESPIRATORY_TRACT
  Filled 2024-06-15: qty 3

## 2024-06-15 NOTE — ED Provider Notes (Signed)
  Physical Exam  BP (!) 142/102   Pulse 96   Temp 98.1 F (36.7 C)   Resp (!) 26   Ht 5' 9 (1.753 m)   Wt 85.7 kg   SpO2 94%   BMI 27.91 kg/m   Physical Exam Vitals and nursing note reviewed.  Constitutional:      Appearance: Normal appearance.     Interventions: He is not intubated. HENT:     Head: Normocephalic and atraumatic.  Eyes:     Extraocular Movements: Extraocular movements intact.     Conjunctiva/sclera: Conjunctivae normal.  Cardiovascular:     Rate and Rhythm: Normal rate and regular rhythm.     Pulses: Normal pulses.     Heart sounds: Normal heart sounds. No murmur heard.    No friction rub. No gallop.  Pulmonary:     Effort: Pulmonary effort is normal. No tachypnea, bradypnea, accessory muscle usage or respiratory distress. He is not intubated.     Breath sounds: No stridor. Examination of the right-upper field reveals wheezing and rhonchi. Examination of the right-middle field reveals wheezing. Examination of the right-lower field reveals wheezing and rhonchi. Wheezing and rhonchi present. No decreased breath sounds or rales.     Comments: Mild wheezing and rhonchi noted right side as previous. Abdominal:     General: Abdomen is flat.     Palpations: Abdomen is soft.     Tenderness: There is no abdominal tenderness.  Skin:    General: Skin is warm and dry.  Neurological:     General: No focal deficit present.     Mental Status: He is alert. Mental status is at baseline.  Psychiatric:        Mood and Affect: Mood normal.     Procedures  Procedures  ED Course / MDM    Medical Decision Making  He was seen in the ED and discharged for COPD exacerbation sent home on doxycycline  and prednisone .  Noted to have been sent home without his oxygen  which she is chronically on 2 L for respiratory failure in the presence of COPD.  He is wishing to go home however states that he normally requires transport home in ambulance to maintain oxygen .  He does not  require any further treatment or evaluation.  His ox saturations are 100% with chronic wheezing and rhonchi noted to right side which are still improved after having had DuoNebs earlier.  Patient vital signs have remained stable throughout the course of patient's time in the ED. Low suspicion for any other emergent pathology at this time. I believe this patient is safe to be discharged. Provided strict return to ER precautions. Patient expressed agreement and understanding of plan. All questions were answered.  Nursing staff alerted again that he does have an to go home via [facility] as he is required to be on oxygen  2 L at baseline.       Beola Terrall RAMAN, PA-C 06/15/24 0534    Albertina Dixon, MD 06/16/24 [phone]

## 2024-06-15 NOTE — Discharge Instructions (Addendum)
 You were seen today for COPD exacerbation.  Your labs and imaging today were reassuring that have low special for any emergent causes symptoms today.  However with your current symptoms, we will treat you with doxycycline  as well as with a steroid for the next 5 days.  Recommend continue to take the antibiotics until completion as well as follow-up with PCP and with pulmonologist.  Please return to the ED remained having new or worsening symptoms which would include worsening shortness of breath requiring you to increase your oxygen , changing in sputum color, chest pain, weakness, vomiting, blood in stool or urine.

## 2024-06-15 NOTE — ED Provider Notes (Signed)
 [place] EMERGENCY DEPARTMENT AT [facility] Provider Note   CSN: [id number] Arrival date & time: 06/15/24  0000     Patient presents with: Chest Pain   Richard [name] is a 54 y.o. male.   Chest Pain Associated symptoms: shortness of breath   Patient is a 53 year old male presents the ED today with acute chest pain that started approximately 2 hours ago, described as sharp and stabbing particularly worse when breathing.  Previous medical history of chronic respiratory failure on 2 L O2, DVT, aortic aneurysm, malignant neoplasm of right lung, TIA, stroke.   Was previously discharged from the hospital in sick/9/25 for multifocal pneumonia having been completed courses of azithromycin  and Rocephin  at that time.  States that he has not had any worsening shortness of breath, but does note that he has had right lower leg swelling and calf pain that has been present for the last 2 months and getting worse.  Denies taking any blood thinners at this time.  Denies fever, headache, vision changes, one-sided weakness, worsening shortness of breath, abdominal pain, nausea, vomiting, diarrhea, melena, hematochezia, dysuria.     Prior to Admission medications   Medication Sig Start Date End Date Taking? Authorizing Provider  doxycycline  (VIBRAMYCIN ) 100 MG capsule Take 1 capsule (100 mg total) by mouth 2 (two) times daily for 5 days. 06/15/24 06/20/24 Yes Beola Terrall RAMAN, PA-C  predniSONE  (DELTASONE ) 50 MG tablet Take 1 tablet (50 mg total) by mouth daily. 06/15/24  Yes Orenthal Debski S, PA-C  albuterol  (PROVENTIL ) (2.5 MG/3ML) 0.083% nebulizer solution Take 2.5 mg by nebulization 3 (three) times daily as needed for shortness of breath or wheezing. 05/11/24   [provider]  albuterol  (VENTOLIN  HFA) 108 (90 Base) MCG/ACT inhaler Inhale 2 puffs into the lungs every 6 (six) hours as needed for wheezing or shortness of breath. 09/24/23   Hunsucker, Donnice SAUNDERS, MD  amLODipine  (NORVASC ) 10 MG  tablet Take 10 mg by mouth every morning. 08/06/23   [provider]  Aspirin -Acetaminophen  (GOODYS BODY PAIN PO) Take 1 packet by mouth daily as needed (for pain).    [provider]  cloNIDine  (CATAPRES ) 0.1 MG tablet Take 0.1 mg by mouth. Take 2 tablets by mouth at 9am and 5pm and 1 tablet at bedtime    [provider]  Fluticasone -Umeclidin-Vilant (TRELEGY ELLIPTA ) 200-62.5-25 MCG/ACT AEPB Inhale 1 puff into the lungs daily. 09/24/23   Hunsucker, Donnice SAUNDERS, MD  furosemide  (LASIX ) 40 MG tablet Take 1 tablet (40 mg total) by mouth daily. 05/22/24 08/20/24  [place], Camellia PARAS, DO  hydrochlorothiazide  (HYDRODIURIL ) 12.5 MG tablet Take 12.5 mg by mouth every morning. 08/06/23   [provider]  levETIRAcetam  (KEPPRA ) 500 MG tablet Take 1 tablet (500 mg total) by mouth 2 (two) times daily. 01/17/23   Norrine Sharper, MD  losartan  (COZAAR ) 100 MG tablet Take 100 mg by mouth daily. 12/31/22   [provider]  sertraline  (ZOLOFT ) 25 MG tablet Take 25 mg by mouth daily. 07/23/23   [provider]    Allergies: Dilaudid  [hydromorphone  hcl] and Tape    Review of Systems  Respiratory:  Positive for shortness of breath.   Cardiovascular:  Positive for chest pain and leg swelling.  All other systems reviewed and are negative.   Updated Vital Signs BP 103/83   Pulse 99   Temp 98.4 F (36.9 C) (Temporal)   Resp (!) 24   Ht 5' 9 (1.753 m)   Wt 85.7 kg  SpO2 93%   BMI 27.91 kg/m   Physical Exam Vitals and nursing note reviewed.  Constitutional:      General: He is not in acute distress.    Appearance: Normal appearance. He is not ill-appearing.  HENT:     Head: Normocephalic and atraumatic.  Eyes:     General:        Right eye: No discharge.        Left eye: No discharge.     Extraocular Movements: Extraocular movements intact.     Conjunctiva/sclera: Conjunctivae normal.  Neck:     Vascular: No hepatojugular reflux or JVD.     Trachea: No  tracheal deviation.  Cardiovascular:     Rate and Rhythm: Normal rate and regular rhythm.     Pulses: Normal pulses.     Heart sounds: Normal heart sounds. No murmur heard.    No friction rub. No gallop.  Pulmonary:     Effort: Pulmonary effort is normal. No accessory muscle usage or respiratory distress.     Breath sounds: No stridor. Examination of the right-upper field reveals rhonchi. Examination of the right-middle field reveals wheezing and rhonchi. Examination of the right-lower field reveals wheezing and rhonchi. Wheezing and rhonchi present. No rales.  Chest:     Chest wall: Tenderness (Mild central sternal chest wall tenderness and left-sided chest wall tenderness to palpation) present.  Abdominal:     General: Abdomen is flat. There is no distension.     Palpations: Abdomen is soft.     Tenderness: There is no abdominal tenderness. There is no right CVA tenderness, left CVA tenderness, guarding or rebound.  Musculoskeletal:        General: Tenderness (Tenderness to ovation on right calf) present.     Cervical back: Normal range of motion. No rigidity.     Right lower leg: Edema (Homans' sign positive on right side) present.     Left lower leg: Edema present.  Skin:    General: Skin is warm and dry.     Findings: No bruising, ecchymosis, erythema or rash.  Neurological:     General: No focal deficit present.     Mental Status: He is alert and oriented to person, place, and time. Mental status is at baseline.     Sensory: No sensory deficit.     Motor: No weakness.  Psychiatric:        Mood and Affect: Mood normal.     (all labs ordered are listed, but only abnormal results are displayed) Labs Reviewed  COMPREHENSIVE METABOLIC PANEL WITH GFR - Abnormal; Notable for the following components:      Result Value   Calcium 8.7 (*)    Albumin 3.3 (*)    All other components within normal limits  CBC WITH DIFFERENTIAL/PLATELET - Abnormal; Notable for the following  components:   Lymphs Abs 0.5 (*)    All other components within normal limits  BRAIN NATRIURETIC PEPTIDE - Abnormal; Notable for the following components:   B Natriuretic Peptide 145.0 (*)    All other components within normal limits  TROPONIN I (HIGH SENSITIVITY) - Abnormal; Notable for the following components:   Troponin I (High Sensitivity) 18 (*)    All other components within normal limits  TROPONIN I (HIGH SENSITIVITY)    EKG: None  Radiology: CT Angio Chest PE W and/or Wo Contrast Result Date: 06/15/2024 CLINICAL DATA:  Chest pain. EXAM: CT ANGIOGRAPHY CHEST WITH CONTRAST TECHNIQUE: Multidetector CT imaging of the chest was  performed using the standard protocol during bolus administration of intravenous contrast. Multiplanar CT image reconstructions and MIPs were obtained to evaluate the vascular anatomy. RADIATION DOSE REDUCTION: This exam was performed according to the departmental dose-optimization program which includes automated exposure control, adjustment of the mA and/or kV according to patient size and/or use of iterative reconstruction technique. CONTRAST:  75mL OMNIPAQUE  IOHEXOL  350 MG/ML SOLN COMPARISON:  May 18, 2024 FINDINGS: Cardiovascular: The ascending thoracic aorta measures 3.9 cm in diameter. Satisfactory opacification a small, stable anterior pericardial effusion is seen (approximately 6.3 mm in thickness). Mediastinum/Nodes: No enlarged mediastinal, hilar, or axillary lymph nodes. Thyroid  gland, trachea, and esophagus demonstrate no significant findings. Lungs/Pleura: There is moderate severity centrilobular emphysematous lung disease involving predominant the bilateral upper lobes. Post treatment changes are noted within the right upper lobe and posterior right hilar regions. Stable areas of diffuse peribronchial micro nodularity are noted, slightly more prominent within the posterior aspect of the right upper lobe. A 16 mm x 14 mm pleural based pulmonary nodule is seen  within the posteromedial aspect of the left lower lobe. This measured 13 mm x 11 mm on the prior exam. No pleural effusion or pneumothorax is identified. Upper Abdomen: There is stable diffuse bilateral adrenal gland enlargement. Musculoskeletal: No chest wall abnormality. No acute or significant osseous findings. Review of the MIP images confirms the above findings. IMPRESSION: 1. No evidence of pulmonary embolism. 2. Post treatment changes within the right upper lobe and posterior right hilar regions. 3. Stable areas of diffuse peribronchial micro nodularity which may represent sequelae associated with inflammatory or infectious process. 4. 16 mm x 14 mm pleural based pulmonary nodule within the posteromedial aspect of the left lower lobe, very mildly increased in size since the prior exam. Correlation with nuclear medicine PET CT is recommended to further exclude the presence of an underlying neoplastic process. 5. Stable diffuse bilateral adrenal gland enlargement which may represent sequelae associated with adrenal hyperplasia. 6. Emphysema. Electronically Signed   By: Suzen Dials M.D.   On: 06/15/2024 03:08   DG Chest 1 View Result Date: 06/15/2024 CLINICAL DATA:  Chest pain EXAM: CHEST  1 VIEW COMPARISON:  05/18/2024 FINDINGS: Stable cardiomediastinal silhouette. Similar enlargement of the right hilum with right perihilar scarring. Unchanged interstitial opacities in the left lower lung. No definite pleural effusion. No pneumothorax. No displaced rib fracture. IMPRESSION: 1. Unchanged enlargement of the right hilum with right perihilar scarring consistent with post treatment change. 2. Unchanged interstitial opacities in the left lower lung, likely infectious/inflammatory. Electronically Signed   By: Norman Gatlin M.D.   On: 06/15/2024 01:24     Procedures   Medications Ordered in the ED  iohexol  (OMNIPAQUE ) 350 MG/ML injection 75 mL (75 mLs Intravenous Contrast Given 06/15/24 0236)   ipratropium-albuterol  (DUONEB) 0.5-2.5 (3) MG/3ML nebulizer solution 9 mL (9 mLs Nebulization Given 06/15/24 0331)  predniSONE  (DELTASONE ) tablet 60 mg (60 mg Oral Given 06/15/24 0331)                                Medical Decision Making Amount and/or Complexity of Data Reviewed Labs: ordered. Radiology: ordered. ECG/medicine tests: ordered.  Risk Prescription drug management.   This patient is a 53 year old male who presents to the ED for concern of pleuritic chest pain that started tonight when he was lying down in bed.  Noted a previous medical history of DVT, malignant neoplasm of lung, aortic aneurysm, TIA,  COPD, chronic respiratory failure on 2 L of oxygen  at baseline.  Noted to have impacted by EMS and he was not wearing his O2, noted to be noncompliant with low saturations at that time.  However placed on 2 L and saturations improved to normal.  Provided aspirin  and nitroglycerin  by EMS.  Noted be currently following oncology for lung cancer.  Also going to be following following up with pulmonologist for the respiratory failure as it is currently management PCP.  On physical exam, patient is in no acute distress, afebrile, alert and orient x 4, speaking in full sentences, nontachycardic.  Noted be mildly tachypneic, with respiratory rate of 24-26.  Notably has wheezing and rhonchi on right side.  Also has bilateral lower leg edema.  [place]' sign positive on right side.  RRR, no murmur. Exam is otherwise unremarkable.  Due to cancer, pleuritic chest pain and chronic shortness of breath with bilateral lower leg edema, will CT PE study And to get baseline labs.  Labs do show an elevated BNP however is actually decreased from previous 3 weeks ago, also does note a troponin of 18 with repeat of 17.  Low suspicion for ACS, PE, heart failure exacerbation.  Suspect likely COPD exacerbation with wheezing and rhonchi on right side.  Provided DuoNebs and prednisone .  On reevaluation, patient  notes that he is feeling significantly better.  Still wheezing and rhonchi is present on right side.  After finishing course of DuoNebs, patient wheezing has improved significantly.  Will provide him with prednisone  in the outpatient as well as doxycycline  for COPD exacerbation.  Will have him continue follow-up with PCP, pulmonology and oncology.  Case was gust with attending who both saw the patient and discussed treatment plan.  Patient vital signs have remained stable throughout the course of patient's time in the ED. Low suspicion for any other emergent pathology at this time. I believe this patient is safe to be discharged. Provided strict return to ER precautions. Patient expressed agreement and understanding of plan. All questions were answered.   Differential diagnoses prior to evaluation: The emergent differential diagnosis includes, but is not limited to, pleural effusions, empyema, effusion, pericarditis, aortic dissection, pneumothorax, pneumonia, Boerhaave's, costochondritis,. This is not an exhaustive differential.   Past Medical History / Co-morbidities / Social History: HTN, chronic frontal sinusitis, tobacco abuse, COPD, alcohol abuse, TIA, DVT, stroke, history of home oxygen , GERD, malignant neoplasm of right lung, Respiratory failure, alcohol abuse, aortic aneurysm.  Additional history: Chart reviewed. Pertinent results include:   Seen on 06/12/2024 by Dr. Gatha with oncology noted to have stage III non-small cell lung cancer.  Was started on immunotherapy in 2024 however had noted been inconsistent with compliance with appointments and treatments.  With treatments discontinued. Not currently seeing pulmonologist. Scheduled to have repeat CT in 3 months to reevaluate left lung.  Discharged on 05/22/2024 after being treated for chronic respiratory failure/COPD exacerbation.  Noted previously been seen in the ED on 05/17/2024 and discharged on doxycycline  diagnosed with multifocal  pneumonia.  Return for persistent chest pain and shortness of breath.  Provided ceftriaxone  and azithromycin .    Lab Tests/Imaging studies: I personally interpreted labs/imaging and the pertinent results include:    CBC unremarkable CMP shows mild hypocalcemia of 8.7 BNP is elevated at 145 which is actually decreased from previous .  3 weeks ago which was 150.8. Troponin initially 18, repeat 20-17 Chest x-ray shows unchanged large right hilum with right hilar Hailer scarring consistent with post treatment, unchanged  interstitial opacities of left lung  CT PE study shows no PE, posttreatment changes to right upper lobe and posterior right hilar regions, diffuse peribronchial micro nodularity which may represent inflammatory versus infectious process, 16 mm x 14 mm pleural based pulmonary nodule, stable diffuse bilateral adrenal gland enlargement, emphysema  I agree with the radiologist interpretation.  Cardiac monitoring: EKG obtained and interpreted by myself and attending physician which shows: Normal sinus rhythm   EKG Interpretation Date/Time:  Thursday June 15 2024 00:55:50 EDT Ventricular Rate:  92 PR Interval:  198 QRS Duration:  82 QT Interval:  386 QTC Calculation: 477 R Axis:   71  Text Interpretation: Normal sinus rhythm Right atrial enlargement Early repolarization Borderline ECG When compared with ECG of 18-May-2024 23:19, PREVIOUS ECG IS PRESENT Confirmed by Kommor, Madison ([id number]) on 06/15/2024 4:17:23 AM         Medications: I ordered medication including DuoNebs, prednisone .  I have reviewed the patients home medicines and have made adjustments as needed.  Critical Interventions: None  Social Determinants of Health: Notably has had poor compliance with cancer treatments, was previously placed on observation but is now currently seeing oncology again.  Disposition: After consideration of the diagnostic results and the patients response to treatment, I feel that  the patient would benefit from discharge and treatment as above.   emergency department workup does not suggest an emergent condition requiring admission or immediate intervention beyond what has been performed at this time. The plan is: Doxycycline  and prednisone  for treatment of COPD exacerbation, follow-up PCP as well as pulmonology and oncology, return to the ED for new or worsening symptoms. The patient is safe for discharge and has been instructed to return immediately for worsening symptoms, change in symptoms or any other concerns.   Final diagnoses:  COPD exacerbation [facility])    ED Discharge Orders          Ordered    predniSONE  (DELTASONE ) 50 MG tablet  Daily        06/15/24 0411    doxycycline  (VIBRAMYCIN ) 100 MG capsule  2 times daily        06/15/24 0411               Clearance Chenault S, PA-C 06/15/24 0436    Albertina Dixon, MD 06/16/24 [phone]

## 2024-06-15 NOTE — ED Triage Notes (Signed)
 Patient presents with shortness of breath and requesting oxygen . Seen earlier tonight and discharged. SPO2 noted to be 68% on room air. Patient states he is comfortable with discharge but requesting ambulance to take him home since he does not have his O2 with him.

## 2024-06-15 NOTE — ED Triage Notes (Signed)
 Patient arrives via [facility] for chest pain that started tonight. HX of COPD - non compliant with home O2. EMS found patient to be 78% on room air - improved to 96% with 2L. 324mg  aspirin  and 1 nitroglycerin  given PTA.   140/94, HR 104, SPOO2 78% RA - 96% 2L

## 2024-06-15 NOTE — ED Notes (Signed)
 Called pts spouse and no answer.

## 2024-06-15 NOTE — ED Notes (Signed)
 Patient transported to CT scan .

## 2024-06-15 NOTE — ED Notes (Signed)
 [facility] called

## 2024-06-30 ENCOUNTER — Encounter (HOSPITAL_COMMUNITY): Payer: Self-pay

## 2024-06-30 ENCOUNTER — Emergency Department (HOSPITAL_COMMUNITY)
Admission: EM | Admit: 2024-06-30 | Discharge: 2024-07-01 | Disposition: A | Discharge status: Home / Self Care | Attending: Emergency Medicine | Admitting: Emergency Medicine

## 2024-06-30 ENCOUNTER — Emergency Department (HOSPITAL_COMMUNITY)

## 2024-06-30 ENCOUNTER — Other Ambulatory Visit: Payer: Self-pay

## 2024-06-30 DIAGNOSIS — R600 Localized edema: Secondary | ICD-10-CM | POA: Diagnosis not present

## 2024-06-30 DIAGNOSIS — Z7982 Long term (current) use of aspirin: Secondary | ICD-10-CM | POA: Diagnosis not present

## 2024-06-30 DIAGNOSIS — Z85118 Personal history of other malignant neoplasm of bronchus and lung: Secondary | ICD-10-CM | POA: Insufficient documentation

## 2024-06-30 DIAGNOSIS — Z7951 Long term (current) use of inhaled steroids: Secondary | ICD-10-CM | POA: Insufficient documentation

## 2024-06-30 DIAGNOSIS — J441 Chronic obstructive pulmonary disease with (acute) exacerbation: Secondary | ICD-10-CM | POA: Insufficient documentation

## 2024-06-30 DIAGNOSIS — J9611 Chronic respiratory failure with hypoxia: Secondary | ICD-10-CM | POA: Diagnosis not present

## 2024-06-30 DIAGNOSIS — I110 Hypertensive heart disease with heart failure: Secondary | ICD-10-CM | POA: Diagnosis not present

## 2024-06-30 DIAGNOSIS — I82C19 Acute embolism and thrombosis of unspecified internal jugular vein: Secondary | ICD-10-CM | POA: Diagnosis not present

## 2024-06-30 DIAGNOSIS — J45909 Unspecified asthma, uncomplicated: Secondary | ICD-10-CM | POA: Insufficient documentation

## 2024-06-30 DIAGNOSIS — I509 Heart failure, unspecified: Secondary | ICD-10-CM | POA: Diagnosis not present

## 2024-06-30 DIAGNOSIS — R0602 Shortness of breath: Secondary | ICD-10-CM | POA: Diagnosis present

## 2024-06-30 MED ORDER — DEXAMETHASONE SODIUM PHOSPHATE 10 MG/ML IJ SOLN
10.0000 mg | Freq: Once | INTRAMUSCULAR | Status: AC
  Administered 2024-06-30: 10 mg via INTRAVENOUS
  Filled 2024-06-30: qty 1

## 2024-06-30 MED ORDER — IPRATROPIUM-ALBUTEROL 0.5-2.5 (3) MG/3ML IN SOLN
3.0000 mL | Freq: Once | RESPIRATORY_TRACT | Status: AC
  Administered 2024-06-30: 3 mL via RESPIRATORY_TRACT
  Filled 2024-06-30: qty 3

## 2024-06-30 NOTE — ED Provider Notes (Signed)
 [place] EMERGENCY DEPARTMENT AT [facility] Provider Note   CSN: [id number] Arrival date & time: 06/30/24  2250     Patient presents with: Shortness of Breath   Richard [name] is a 53 y.o. male.   HPI     53 year old male with history of COPD, lung cancer, and CHF who presents with hypoxia.  Reports increasing shortness of breath throughout the day.  Was not wearing his oxygen  at home.  States that he recently has been wearing 4 to 6 L but felt like he did not need it today.  Has had some cough but no fevers.  Has noted some lower extremity swelling.  Reports that he is compliant with all of his medications but was unclear that he was on a diuretic.  Not having any chest pain.  Reports significant improvement with oxygen .  Prior to Admission medications   Medication Sig Start Date End Date Taking? Authorizing Provider  albuterol  (PROVENTIL ) (2.5 MG/3ML) 0.083% nebulizer solution Take 2.5 mg by nebulization 3 (three) times daily as needed for shortness of breath or wheezing. 05/11/24   [provider]  albuterol  (VENTOLIN  HFA) 108 (90 Base) MCG/ACT inhaler Inhale 2 puffs into the lungs every 6 (six) hours as needed for wheezing or shortness of breath. 09/24/23   Hunsucker, Donnice SAUNDERS, MD  amLODipine  (NORVASC ) 10 MG tablet Take 10 mg by mouth every morning. 08/06/23   [provider]  Aspirin -Acetaminophen  (GOODYS BODY PAIN PO) Take 1 packet by mouth daily as needed (for pain).    [provider]  cloNIDine  (CATAPRES ) 0.1 MG tablet Take 0.1 mg by mouth. Take 2 tablets by mouth at 9am and 5pm and 1 tablet at bedtime    [provider]  Fluticasone -Umeclidin-Vilant (TRELEGY ELLIPTA ) 200-62.5-25 MCG/ACT AEPB Inhale 1 puff into the lungs daily. 09/24/23   Hunsucker, Donnice SAUNDERS, MD  furosemide  (LASIX ) 40 MG tablet Take 1 tablet (40 mg total) by mouth daily. 05/22/24 08/20/24  [place], Eric J, DO  hydrochlorothiazide  (HYDRODIURIL ) 12.5 MG tablet Take 12.5  mg by mouth every morning. 08/06/23   [provider]  levETIRAcetam  (KEPPRA ) 500 MG tablet Take 1 tablet (500 mg total) by mouth 2 (two) times daily. 01/17/23   Norrine Sharper, MD  losartan  (COZAAR ) 100 MG tablet Take 100 mg by mouth daily. 12/31/22   [provider]  predniSONE  (DELTASONE ) 50 MG tablet Take 1 tablet (50 mg total) by mouth daily. 06/15/24   Bauer, Collin S, PA-C  sertraline  (ZOLOFT ) 25 MG tablet Take 25 mg by mouth daily. 07/23/23   [provider]    Allergies: Dilaudid  [hydromorphone  hcl] and Tape    Review of Systems  Constitutional:  Negative for fever.  Respiratory:  Positive for shortness of breath and wheezing.   Cardiovascular:  Positive for leg swelling. Negative for chest pain.  All other systems reviewed and are negative.   Updated Vital Signs BP (!) 165/124   Pulse 100   Temp 98 F (36.7 C) (Oral)   Resp (!) 32   Ht 1.753 m (5' 9)   Wt 88.5 kg   SpO2 (!) 87%   BMI 28.80 kg/m   Physical Exam Vitals and nursing note reviewed.  Constitutional:      Appearance: He is well-developed.     Comments: Chronically ill-appearing but nontoxic  HENT:     Head: Normocephalic and atraumatic.  Eyes:     Pupils: Pupils are equal, round, and reactive to light.  Neck:  Vascular: JVD present.  Cardiovascular:     Rate and Rhythm: Normal rate and regular rhythm.     Heart sounds: Normal heart sounds. No murmur heard. Pulmonary:     Effort: Pulmonary effort is normal. No respiratory distress.     Breath sounds: Wheezing present.     Comments: Speaking in full sentences, nonrebreather in place, fair air movement, wheezing in all lung fields Abdominal:     General: Bowel sounds are normal.     Palpations: Abdomen is soft.     Tenderness: There is no abdominal tenderness. There is no rebound.  Musculoskeletal:     Cervical back: Neck supple.     Comments: 2+ pitting edema bilaterally  Lymphadenopathy:     Cervical: No cervical  adenopathy.  Skin:    General: Skin is warm and dry.  Neurological:     Mental Status: He is alert and oriented to person, place, and time.  Psychiatric:        Mood and Affect: Mood normal.     (all labs ordered are listed, but only abnormal results are displayed) Labs Reviewed  CBC WITH DIFFERENTIAL/PLATELET - Abnormal; Notable for the following components:      Result Value   RDW 15.8 (*)    Lymphs Abs 0.5 (*)    All other components within normal limits  BASIC METABOLIC PANEL WITH GFR - Abnormal; Notable for the following components:   CO2 20 (*)    Calcium 8.8 (*)    All other components within normal limits  BRAIN NATRIURETIC PEPTIDE - Abnormal; Notable for the following components:   B Natriuretic Peptide 292.3 (*)    All other components within normal limits  TROPONIN I (HIGH SENSITIVITY)  TROPONIN I (HIGH SENSITIVITY)    EKG: None  Radiology: DG Chest Portable 1 View Result Date: 06/30/2024 CLINICAL DATA:  SOB EXAM: PORTABLE CHEST 1 VIEW COMPARISON:  Chest x-ray 06/15/2024, CT chest 06/15/2024 FINDINGS: The heart and mediastinal contours are within normal limits. Redemonstration of right upper lobe scarring and architectural distortion with associated increased interstitial markings. Persistent increased interstitial markings. No pulmonary edema. No pleural effusion. No pneumothorax. No acute osseous abnormality. IMPRESSION: Redemonstration of right upper lobe scarring and architectural distortion with associated increased interstitial markings. Persistent increased interstitial markings. Findings better evaluated on CT angio chest 06/15/2024. Electronically Signed   By: Morgane  Naveau M.D.   On: 06/30/2024 23:34     .Critical Care  Performed by: Bari Charmaine FALCON, MD Authorized by: Bari Charmaine FALCON, MD   Critical care provider statement:    Critical care time (minutes):  60   Critical care was necessary to treat or prevent imminent or life-threatening  deterioration of the following conditions:  Respiratory failure   Critical care was time spent personally by me on the following activities:  Development of treatment plan with patient or surrogate, discussions with consultants, evaluation of patient's response to treatment, examination of patient, ordering and review of laboratory studies, ordering and review of radiographic studies, ordering and performing treatments and interventions, pulse oximetry, re-evaluation of patient's condition and review of old charts    Medications Ordered in the ED  dexamethasone  (DECADRON ) injection 10 mg (10 mg Intravenous Given 06/30/24 2354)  ipratropium-albuterol  (DUONEB) 0.5-2.5 (3) MG/3ML nebulizer solution 3 mL (3 mLs Nebulization Given 06/30/24 2346)  ipratropium-albuterol  (DUONEB) 0.5-2.5 (3) MG/3ML nebulizer solution 3 mL (3 mLs Nebulization Given 07/01/24 0202)  furosemide  (LASIX ) injection 40 mg (40 mg Intravenous Given 07/01/24 0249)  Clinical Course as of 07/01/24 0543  Sat Jul 01, 2024  0302 Patient improved.  I placed him on 4 L of nasal cannula.  Will monitor to ensure that his O2 sat set up.  He does have oxygen  at home.  Breath sounds are clear with only occasional wheezing. [CH]  0322 Increased to 5 L.  Patient satting 85% on 4 L.  Will continue to monitor.  Patient adamant that he sats 88% on 5-6 L at home. [CH]  0405 Patient requesting to go home.  Satting 88% on 6 L.  Not significantly tachypneic.  States that he feels at his baseline. [CH]  0540 Patient requesting to go home by [facility].  Requires oxygen  therapy. [CH]  [phone] Patient continues to sat 88% but is adamant that this is his baseline.  He does have home oxygen . [CH]    Clinical Course User Index [CH] Shakiah Wester, Charmaine FALCON, MD                                 Medical Decision Making Amount and/or Complexity of Data Reviewed Labs: ordered. Radiology: ordered.  Risk Prescription drug management.   This patient presents to the ED  for concern of shortness of breath, hypoxia, this involves an extensive number of treatment options, and is a complaint that carries with it a high risk of complications and morbidity.  I considered the following differential and admission for this acute, potentially life threatening condition.  The differential diagnosis includes acute on chronic respiratory failure, COPD, CHF, pneumonia  MDM:    This is a 53 year old male who presents with shortness of breath.  Admits not wearing his oxygen  at home.  He was noted to have O2 sats in the 70s.  Reports significant improvement with just oxygen  therapy.  He is wheezing in all lung fields.  He is able to speak in full sentences.  Patient was given DuoNeb x 3 and Decadron  with improvement of aeration.  He was slowly weaned to 6 L of nasal cannula which he states is his baseline.  Clinically I am concerned that if he does any significant exertion, he will desaturate.  However, patient is adamant that he feels normal and at his baseline and would like to go home.  O2 sats 87 to 88% consistently on 6 L.  He does not currently follow with pulmonology and has difficulty making it to outpatient appointments.  I did offer admission but patient declined and states he wants to go home.  No indication for antibiotics as no obvious pneumonia on chest x-ray.  Will plan for discharge by PTAR.  Patient reports that he has oxygen  at home readily available.  High risk for return as patient has a known history of noncompliance.  (Labs, imaging, consults)  Labs: I Ordered, and personally interpreted labs.  The pertinent results include: CBC, BMP, BNP, troponin x 2  Imaging Studies ordered: I ordered imaging studies including chest x-ray I independently visualized and interpreted imaging. I agree with the radiologist interpretation  Additional history obtained from chart review.  External records from outside source obtained and reviewed including prior  evaluations  Cardiac Monitoring: The patient was maintained on a cardiac monitor.  If on the cardiac monitor, I personally viewed and interpreted the cardiac monitored which showed an underlying rhythm of: Sinus  Reevaluation: After the interventions noted above, I reevaluated the patient and found that they have :improved  Social  Determinants of Health:  lives independently  Disposition: Discharge  Co morbidities that complicate the patient evaluation  Past Medical History:  Diagnosis Date   Asthma    Brain bleed (HCC)    DVT (deep venous thrombosis) (HCC) 02/19/2015   RLE   GERD (gastroesophageal reflux disease)    History of home oxygen  therapy    Hypertension    Seizures (HCC)    last episode 03/2013   Stroke (HCC)      Medicines Meds ordered this encounter  Medications   dexamethasone  (DECADRON ) injection 10 mg   ipratropium-albuterol  (DUONEB) 0.5-2.5 (3) MG/3ML nebulizer solution 3 mL   ipratropium-albuterol  (DUONEB) 0.5-2.5 (3) MG/3ML nebulizer solution 3 mL   furosemide  (LASIX ) injection 40 mg    I have reviewed the patients home medicines and have made adjustments as needed  Problem List / ED Course: Problem List Items Addressed This Visit       Respiratory   COPD exacerbation (HCC)   Chronic respiratory failure with hypoxia (HCC) - Primary             Final diagnoses:  Chronic respiratory failure with hypoxia (HCC)  COPD exacerbation [facility])    ED Discharge Orders     None          Bari, Charmaine FALCON, MD 07/01/24 [phone]

## 2024-06-30 NOTE — ED Triage Notes (Signed)
 PT BIB [facility] from home for SOB. PT is noted to having swelling to his lower legs. Pt states he has COPD but doesn't like to use his concentrator. Pt states he has CHF but isn't on any antidiuretics. PT was noted to be stating at 76% on room air upon EMS arrival. PT is A&Ox 4. Pt has a 18G LAC.

## 2024-07-01 DIAGNOSIS — J441 Chronic obstructive pulmonary disease with (acute) exacerbation: Secondary | ICD-10-CM | POA: Diagnosis not present

## 2024-07-01 LAB — CBC WITH DIFFERENTIAL/PLATELET
Abs Immature Granulocytes: 0.02 K/uL (ref 0.00–0.07)
Basophils Absolute: 0 K/uL (ref 0.0–0.1)
Basophils Relative: 0 %
Eosinophils Absolute: 0 K/uL (ref 0.0–0.5)
Eosinophils Relative: 0 %
HCT: 47 % (ref 39.0–52.0)
Hemoglobin: 15.5 g/dL (ref 13.0–17.0)
Immature Granulocytes: 0 %
Lymphocytes Relative: 8 %
Lymphs Abs: 0.5 K/uL — ABNORMAL LOW (ref 0.7–4.0)
MCH: 32.8 pg (ref 26.0–34.0)
MCHC: 33 g/dL (ref 30.0–36.0)
MCV: 99.4 fL (ref 80.0–100.0)
Monocytes Absolute: 0.8 K/uL (ref 0.1–1.0)
Monocytes Relative: 12 %
Neutro Abs: 4.9 K/uL (ref 1.7–7.7)
Neutrophils Relative %: 80 %
Platelets: 260 K/uL (ref 150–400)
RBC: 4.73 MIL/uL (ref 4.22–5.81)
RDW: 15.8 % — ABNORMAL HIGH (ref 11.5–15.5)
WBC: 6.2 K/uL (ref 4.0–10.5)
nRBC: 0 % (ref 0.0–0.2)

## 2024-07-01 LAB — BRAIN NATRIURETIC PEPTIDE: B Natriuretic Peptide: 292.3 pg/mL — ABNORMAL HIGH (ref 0.0–100.0)

## 2024-07-01 LAB — BASIC METABOLIC PANEL WITH GFR
Anion gap: 10 (ref 5–15)
BUN: 9 mg/dL (ref 6–20)
CO2: 20 mmol/L — ABNORMAL LOW (ref 22–32)
Calcium: 8.8 mg/dL — ABNORMAL LOW (ref 8.9–10.3)
Chloride: 109 mmol/L (ref 98–111)
Creatinine, Ser: 0.98 mg/dL (ref 0.61–1.24)
GFR, Estimated: >60 mL/min (ref >60)
Glucose, Bld: 79 mg/dL (ref 70–99)
Potassium: 4 mmol/L (ref 3.5–5.1)
Sodium: 139 mmol/L (ref 135–145)

## 2024-07-01 LAB — TROPONIN I (HIGH SENSITIVITY)
Troponin I (High Sensitivity): 13 ng/L (ref <18)
Troponin I (High Sensitivity): 16 ng/L (ref <18)

## 2024-07-01 MED ORDER — FUROSEMIDE 10 MG/ML IJ SOLN
40.0000 mg | Freq: Once | INTRAMUSCULAR | Status: AC
  Administered 2024-07-01: 40 mg via INTRAVENOUS
  Filled 2024-07-01: qty 4

## 2024-07-01 MED ORDER — IPRATROPIUM-ALBUTEROL 0.5-2.5 (3) MG/3ML IN SOLN
3.0000 mL | Freq: Once | RESPIRATORY_TRACT | Status: AC
  Administered 2024-07-01: 3 mL via RESPIRATORY_TRACT
  Filled 2024-07-01: qty 3

## 2024-07-01 NOTE — ED Notes (Signed)
 PT was on N @6  LPM, when he began to destat to 84%. This RN placed him back on a NRB @10  LPM where he went up to 95

## 2024-07-01 NOTE — Discharge Instructions (Addendum)
 You were seen today for hypoxia and respiratory failure.  It is very important that you maintain your nasal cannula at home or you will desaturate and become short of breath.  You should also use all of your medications including your inhalers and rescue inhalers.  You would also benefit from seeing a pulmonologist.

## 2024-07-10 ENCOUNTER — Encounter: Payer: Self-pay | Admitting: Interventional Radiology

## 2024-07-10 ENCOUNTER — Ambulatory Visit (INDEPENDENT_AMBULATORY_CARE_PROVIDER_SITE_OTHER): Admitting: Podiatry

## 2024-07-10 ENCOUNTER — Ambulatory Visit (INDEPENDENT_AMBULATORY_CARE_PROVIDER_SITE_OTHER)

## 2024-07-10 DIAGNOSIS — M7751 Other enthesopathy of right foot: Secondary | ICD-10-CM | POA: Diagnosis not present

## 2024-07-10 DIAGNOSIS — M7989 Other specified soft tissue disorders: Secondary | ICD-10-CM | POA: Diagnosis not present

## 2024-07-10 DIAGNOSIS — M778 Other enthesopathies, not elsewhere classified: Secondary | ICD-10-CM

## 2024-07-10 DIAGNOSIS — M7752 Other enthesopathy of left foot: Secondary | ICD-10-CM | POA: Diagnosis not present

## 2024-07-10 NOTE — Progress Notes (Unsigned)
  Subjective:  Patient ID: Richard [name], male    DOB: 1971/10/03,  MRN: [id number]  Chief Complaint  Patient presents with   Foot Pain    Bilateral foot swelling no known injuries     Discussed the use of AI scribe software for clinical note transcription with the patient, who gave verbal consent to proceed.  History of Present Illness Richard [name] is a 53 year old male with COPD who presents with leg swelling.  Swelling in the leg has been present for the past couple of weeks without significant pain, fevers, chills, or recent injuries. He takes Lasix  (furosemide ) and blood pressure medication but is not on anticoagulants. He denies any wounds on his legs and has not been using compression socks. He tries to manage the swelling by staying off his feet.      Objective:    Physical Exam General: AAO x3, NAD  Dermatological: No open wounds at this time. There is no erythema or warmth.  Vascular: Dorsalis Pedis artery and Posterior Tibial artery pedal pulses are 2/4 bilateral with immedate capillary fill time.  There is no pain with calf compression, swelling, warmth, erythema.   Neruologic: Grossly intact via light touch bilateral.  Musculoskeletal: Significant pitting edema on the right side worse than left side.  He has discomfort with the edema but there is no pain with calf compression is no area pinpoint tenderness.  Gait: Unassisted, Nonantalgic.     No images are attached to the encounter.    Results     Assessment:   1. Capsulitis of foot, right   2. Capsulitis of foot, left   3. Swelling of lower leg      Plan:  Patient was evaluated and treated and all questions answered.  Assessment and Plan Assessment & Plan Leg swelling Bilateral leg swelling, more in right leg, likely fluid retention.  - Order leg ultrasound to rule out DVT. - Advise leg elevation. - Apply ACE bandages for compression as compression socks are difficult.  - If there is any  worsening, or if he develops any signs or symptoms of infection or show chest pain, shortness of breath he is to report to emergency room.  X-rays obtained reviewed bilaterally.  Multiple views of the foot obtained.  No acute fracture.  Edema present.  Calcaneal spurring is present.  Flatfoot is present.   No follow-ups on file.   Donnice JONELLE Fees DPM

## 2024-07-11 ENCOUNTER — Encounter (HOSPITAL_BASED_OUTPATIENT_CLINIC_OR_DEPARTMENT_OTHER): Payer: Self-pay | Admitting: Orthopaedic Surgery

## 2024-07-13 ENCOUNTER — Ambulatory Visit (HOSPITAL_COMMUNITY)
Admission: RE | Admit: 2024-07-13 | Discharge: 2024-07-13 | Disposition: A | Discharge status: Home / Self Care | Source: Ambulatory Visit | Attending: Podiatry | Admitting: Podiatry

## 2024-07-13 DIAGNOSIS — M7989 Other specified soft tissue disorders: Secondary | ICD-10-CM | POA: Insufficient documentation

## 2024-07-14 ENCOUNTER — Ambulatory Visit: Payer: Self-pay | Admitting: Podiatry

## 2024-07-21 ENCOUNTER — Telehealth: Payer: Self-pay

## 2024-07-21 NOTE — Telephone Encounter (Signed)
 Copied from CRM #[id number]. Topic: General - Other >> Jul 20, 2024  4:33 PM Shona S wrote: Reason for CRM: patient wouild like to know who was the authorizing provider for his oxygen  machine and dme company.  Tried calling the patient regarding DME company concerns. Per referral from 12/30/2022 oxygen  was sent to [employer].  Left voicemail to call back.

## 2024-07-31 ENCOUNTER — Emergency Department (HOSPITAL_COMMUNITY)
Admission: EM | Admit: 2024-07-31 | Discharge: 2024-08-01 | Disposition: A | Discharge status: Home / Self Care | Attending: Emergency Medicine | Admitting: Emergency Medicine

## 2024-07-31 ENCOUNTER — Emergency Department (HOSPITAL_COMMUNITY)

## 2024-07-31 ENCOUNTER — Other Ambulatory Visit: Payer: Self-pay

## 2024-07-31 ENCOUNTER — Encounter (HOSPITAL_COMMUNITY): Payer: Self-pay

## 2024-07-31 DIAGNOSIS — J441 Chronic obstructive pulmonary disease with (acute) exacerbation: Secondary | ICD-10-CM | POA: Diagnosis not present

## 2024-07-31 DIAGNOSIS — R0602 Shortness of breath: Secondary | ICD-10-CM | POA: Diagnosis not present

## 2024-07-31 DIAGNOSIS — I509 Heart failure, unspecified: Secondary | ICD-10-CM | POA: Insufficient documentation

## 2024-07-31 DIAGNOSIS — Z85118 Personal history of other malignant neoplasm of bronchus and lung: Secondary | ICD-10-CM | POA: Diagnosis not present

## 2024-07-31 DIAGNOSIS — R600 Localized edema: Secondary | ICD-10-CM | POA: Insufficient documentation

## 2024-07-31 DIAGNOSIS — Z7982 Long term (current) use of aspirin: Secondary | ICD-10-CM | POA: Insufficient documentation

## 2024-07-31 LAB — CBC
HCT: 53.1 % — ABNORMAL HIGH (ref 39.0–52.0)
Hemoglobin: 17.7 g/dL — ABNORMAL HIGH (ref 13.0–17.0)
MCH: 32.9 pg (ref 26.0–34.0)
MCHC: 33.3 g/dL (ref 30.0–36.0)
MCV: 98.7 fL (ref 80.0–100.0)
Platelets: 272 K/uL (ref 150–400)
RBC: 5.38 MIL/uL (ref 4.22–5.81)
RDW: 15.3 % (ref 11.5–15.5)
WBC: 6.5 K/uL (ref 4.0–10.5)
nRBC: 0 % (ref 0.0–0.2)

## 2024-07-31 LAB — BRAIN NATRIURETIC PEPTIDE: B Natriuretic Peptide: 265.8 pg/mL — ABNORMAL HIGH (ref 0.0–100.0)

## 2024-07-31 MED ORDER — IPRATROPIUM-ALBUTEROL 0.5-2.5 (3) MG/3ML IN SOLN
3.0000 mL | Freq: Once | RESPIRATORY_TRACT | Status: AC
  Administered 2024-07-31: 3 mL via RESPIRATORY_TRACT
  Filled 2024-07-31: qty 3

## 2024-07-31 MED ORDER — ALBUTEROL SULFATE HFA 108 (90 BASE) MCG/ACT IN AERS: 2.0000 | INHALATION_SPRAY | RESPIRATORY_TRACT | 3 refills | Status: DC | PRN

## 2024-07-31 MED ORDER — METHYLPREDNISOLONE SODIUM SUCC 125 MG IJ SOLR
125.0000 mg | Freq: Every day | INTRAMUSCULAR | Status: DC
  Administered 2024-07-31: 125 mg via INTRAVENOUS
  Filled 2024-07-31: qty 2

## 2024-07-31 MED ORDER — PREDNISONE 10 MG PO TABS: 60.0000 mg | ORAL_TABLET | Freq: Every day | ORAL | 0 refills | Status: AC

## 2024-07-31 NOTE — ED Provider Notes (Signed)
 [place] EMERGENCY DEPARTMENT AT [facility] Provider Note   CSN: [id number] Arrival date & time: 07/31/24  2126     Patient presents with: Shortness of Breath   Richard [name] is a 53 y.o. male w/ hx of COPD on home oxygen , here with shortness of breath.  EMS reports patient called out for SOB.  Patient initially refused transport.  History of calling EMS and refusing transport.  Pt says he feels awful, feels SOB, states he tried all the medicines but nothing helped.  Medical chart review showed hx of non small cell lung cancer, sees oncology treated with palliative radiotherapy and chemo, w/ noncompliance issues with chemo, last cycle March 15 2023.  Patient has hx of combatitive behavior with EMS and staff, oxygen  and medical noncompliance at home per chart review.  Last treated for COPD flare up 06/30/24, one month ago, in the ED, with patient insisting on discharge despite persistent hypoxia on 6L [place]   HPI     Prior to Admission medications   Medication Sig Start Date End Date Taking? Authorizing Provider  albuterol  (VENTOLIN  HFA) 108 (90 Base) MCG/ACT inhaler Inhale 2 puffs into the lungs every 4 (four) hours as needed for wheezing or shortness of breath. 07/31/24  Yes Thelma Viana, Donnice PARAS, MD  predniSONE  (DELTASONE ) 10 MG tablet Take 6 tablets (60 mg total) by mouth daily with breakfast for 5 days. 08/01/24 08/06/24 Yes Devaeh Amadi, Donnice PARAS, MD  albuterol  (PROVENTIL ) (2.5 MG/3ML) 0.083% nebulizer solution Take 2.5 mg by nebulization 3 (three) times daily as needed for shortness of breath or wheezing. 05/11/24   [provider]  albuterol  (VENTOLIN  HFA) 108 (90 Base) MCG/ACT inhaler Inhale 2 puffs into the lungs every 6 (six) hours as needed for wheezing or shortness of breath. 09/24/23   Hunsucker, Donnice SAUNDERS, MD  amLODipine  (NORVASC ) 10 MG tablet Take 10 mg by mouth every morning. 08/06/23   [provider]  Aspirin -Acetaminophen  (GOODYS BODY PAIN PO) Take 1  packet by mouth daily as needed (for pain).    [provider]  cloNIDine  (CATAPRES ) 0.1 MG tablet Take 0.1 mg by mouth. Take 2 tablets by mouth at 9am and 5pm and 1 tablet at bedtime    [provider]  Fluticasone -Umeclidin-Vilant (TRELEGY ELLIPTA ) 200-62.5-25 MCG/ACT AEPB Inhale 1 puff into the lungs daily. 09/24/23   Hunsucker, Donnice SAUNDERS, MD  furosemide  (LASIX ) 40 MG tablet Take 1 tablet (40 mg total) by mouth daily. 05/22/24 08/20/24  [place], Eric J, DO  hydrochlorothiazide  (HYDRODIURIL ) 12.5 MG tablet Take 12.5 mg by mouth every morning. 08/06/23   [provider]  levETIRAcetam  (KEPPRA ) 500 MG tablet Take 1 tablet (500 mg total) by mouth 2 (two) times daily. 01/17/23   Norrine Sharper, MD  losartan  (COZAAR ) 100 MG tablet Take 100 mg by mouth daily. 12/31/22   [provider]  predniSONE  (DELTASONE ) 50 MG tablet Take 1 tablet (50 mg total) by mouth daily. 06/15/24   Bauer, Collin S, PA-C  sertraline  (ZOLOFT ) 25 MG tablet Take 25 mg by mouth daily. 07/23/23   [provider]    Allergies: Dilaudid  [hydromorphone  hcl] and Tape    Review of Systems  Updated Vital Signs BP (!) 141/97   Pulse (!) 103   Temp 97.8 F (36.6 C) (Oral)   Resp 17   Ht 5' 9 (1.753 m)   Wt 102.1 kg   SpO2 90%   BMI 33.23 kg/m   Physical Exam Constitutional:  General: He is not in acute distress. HENT:     Head: Normocephalic and atraumatic.  Eyes:     Conjunctiva/sclera: Conjunctivae normal.     Pupils: Pupils are equal, round, and reactive to light.  Cardiovascular:     Rate and Rhythm: Normal rate and regular rhythm.  Pulmonary:     Effort: Pulmonary effort is normal. No respiratory distress.     Comments: 6L [place] at 92% O2 Saturation, wheezing bilaterally, rhonchi in lower lung fields Abdominal:     General: There is no distension.     Tenderness: There is no abdominal tenderness.  Musculoskeletal:     Right lower leg: Edema present.     Left lower leg:  Edema present.  Skin:    General: Skin is warm and dry.  Neurological:     General: No focal deficit present.     Mental Status: He is alert. Mental status is at baseline.  Psychiatric:        Mood and Affect: Mood normal.        Behavior: Behavior normal.     (all labs ordered are listed, but only abnormal results are displayed) Labs Reviewed  CBC - Abnormal; Notable for the following components:      Result Value   Hemoglobin 17.7 (*)    HCT 53.1 (*)    All other components within normal limits  BRAIN NATRIURETIC PEPTIDE - Abnormal; Notable for the following components:   B Natriuretic Peptide 265.8 (*)    All other components within normal limits  ETHANOL  BASIC METABOLIC PANEL WITH GFR    EKG: None  Radiology: DG Chest Portable 1 View Result Date: 07/31/2024 CLINICAL DATA:  Shortness of breath EXAM: PORTABLE CHEST 1 VIEW COMPARISON:  06/30/2024 FINDINGS: Heart is enlarged. Mediastinal contours within normal limits. Left lung clear. Platelike scarring in the right upper lobe, stable. No acute confluent opacities or effusions. No acute bony abnormality. IMPRESSION: Cardiomegaly. No active disease. Electronically Signed   By: Franky Crease M.D.   On: 07/31/2024 23:06     Procedures   Medications Ordered in the ED  methylPREDNISolone  sodium succinate (SOLU-MEDROL ) 125 mg/2 mL injection 125 mg (125 mg Intravenous Given 07/31/24 2208)  ipratropium-albuterol  (DUONEB) 0.5-2.5 (3) MG/3ML nebulizer solution 3 mL (3 mLs Nebulization Given 07/31/24 2200)    Clinical Course as of 07/31/24 2342  Mon Jul 31, 2024  2141 Patient is quarrelsome on arrival, refusing labs initially, threatened to leave and go home. I told him he was not a prisoner here and is free to leave but encouraged him to allow us  to do our medical workup and begin treatment for his shortness of breath.  He is willing to do so for now. [MT]    Clinical Course User Index [MT] Elfreda Blanchet, Donnice PARAS, MD                                  Medical Decision Making Amount and/or Complexity of Data Reviewed Labs: ordered. Radiology: ordered. ECG/medicine tests: ordered.  Risk Prescription drug management.   This patient presents to the ED with concern for shortness of breath. This involves an extensive number of treatment options, and is a complaint that carries with it a high risk of complications and morbidity.  The differential diagnosis includes COPD vs CHF vs pleural effusion vs other  Co-morbidities that complicate the patient evaluation: hx of CHF, COPD  Additional history obtained  from EMS  External records from outside source obtained and reviewed including prior ED records, oncology notes, Echo June 2025 with EF 55-60%  I ordered and personally interpreted labs.  The pertinent results include:  CBC wnl.  BNP at baseline. BMP hemolyzed on repeat checks.   I ordered imaging studies including dg chest I independently visualized and interpreted imaging which showed no emergent findings I agree with the radiologist interpretation  The patient was maintained on a cardiac monitor.  I personally viewed and interpreted the cardiac monitored which showed an underlying rhythm of: tachycardia  I ordered medication including IV steroids, duonebs for suspected COPD exacerbation  I have reviewed the patients home medicines and have made adjustments as needed  Test Considered: doubt acute PE clinically, no indication for CT imaging  Social Determinants of Health:smoking, medical noncompliance issues  After the interventions noted above, I reevaluated the patient and found that they have: improved - patient reporting his breathing feels subjectively better.  He remains borderline hypoxic 88-90% O2 on 6L [place], which appears to be close to his recent baseline levels.  He does not want further treatment and wants to leave the ED.  He will likely need PTAR to return given his oxygen  requirements.  His labs have  repeatedly hemolyzed but he was not willing to wait to recheck his BMP. He understands the risks of leaving against medical advice, prior to completion of his treatment and workup.  I will prescribe him an inhaler and steroids to his pharmacy to use at home.      Final diagnoses:  Shortness of breath    ED Discharge Orders          Ordered    predniSONE  (DELTASONE ) 10 MG tablet  Daily with breakfast        07/31/24 2333    albuterol  (VENTOLIN  HFA) 108 (90 Base) MCG/ACT inhaler  Every 4 hours PRN        07/31/24 2333               Cottie Donnice PARAS, MD 07/31/24 2342

## 2024-07-31 NOTE — ED Triage Notes (Signed)
 Pt from home, brought in by EMS, pt is combative and angry, pt want just meds, pt states he has lung cancer and chf   Chief Complaint  Patient presents with   Shortness of Breath   Past Medical History:  Diagnosis Date   Asthma    Brain bleed (HCC)    DVT (deep venous thrombosis) (HCC) 02/19/2015   RLE   GERD (gastroesophageal reflux disease)    History of home oxygen  therapy    Hypertension    Seizures (HCC)    last episode 03/2013   Stroke (HCC)     Pt c/o sob, due to cigarette smoking   Pulse (!) 104   Temp 97.8 F (36.6 C) (Oral)   Resp 20   Ht 5' 9 (1.753 m)   Wt 102.1 kg   SpO2 90%   BMI 33.23 kg/m   Richard [name]

## 2024-08-07 ENCOUNTER — Emergency Department (HOSPITAL_COMMUNITY)

## 2024-08-07 ENCOUNTER — Emergency Department (HOSPITAL_COMMUNITY)
Admission: EM | Admit: 2024-08-07 | Discharge: 2024-08-07 | Disposition: A | Discharge status: Home / Self Care | Attending: Emergency Medicine | Admitting: Emergency Medicine

## 2024-08-07 DIAGNOSIS — I10 Essential (primary) hypertension: Secondary | ICD-10-CM | POA: Diagnosis not present

## 2024-08-07 DIAGNOSIS — Z7982 Long term (current) use of aspirin: Secondary | ICD-10-CM | POA: Diagnosis not present

## 2024-08-07 DIAGNOSIS — Z91198 Patient's noncompliance with other medical treatment and regimen for other reason: Secondary | ICD-10-CM | POA: Diagnosis not present

## 2024-08-07 DIAGNOSIS — R079 Chest pain, unspecified: Secondary | ICD-10-CM | POA: Diagnosis present

## 2024-08-07 DIAGNOSIS — Z79899 Other long term (current) drug therapy: Secondary | ICD-10-CM | POA: Diagnosis not present

## 2024-08-07 DIAGNOSIS — F17210 Nicotine dependence, cigarettes, uncomplicated: Secondary | ICD-10-CM | POA: Insufficient documentation

## 2024-08-07 DIAGNOSIS — Z5329 Procedure and treatment not carried out because of patient's decision for other reasons: Secondary | ICD-10-CM | POA: Diagnosis not present

## 2024-08-07 DIAGNOSIS — J441 Chronic obstructive pulmonary disease with (acute) exacerbation: Secondary | ICD-10-CM | POA: Diagnosis not present

## 2024-08-07 DIAGNOSIS — Z8673 Personal history of transient ischemic attack (TIA), and cerebral infarction without residual deficits: Secondary | ICD-10-CM | POA: Diagnosis not present

## 2024-08-07 DIAGNOSIS — Z91199 Patient's noncompliance with other medical treatment and regimen due to unspecified reason: Secondary | ICD-10-CM

## 2024-08-07 LAB — I-STAT CG4 LACTIC ACID, ED: Lactic Acid, Venous: 1.3 mmol/L (ref 0.5–1.9)

## 2024-08-07 LAB — TROPONIN I (HIGH SENSITIVITY): Troponin I (High Sensitivity): 23 ng/L — ABNORMAL HIGH (ref <18)

## 2024-08-07 LAB — BASIC METABOLIC PANEL WITH GFR
Anion gap: 12 (ref 5–15)
BUN: 5 mg/dL — ABNORMAL LOW (ref 6–20)
CO2: 22 mmol/L (ref 22–32)
Calcium: 8.4 mg/dL — ABNORMAL LOW (ref 8.9–10.3)
Chloride: 104 mmol/L (ref 98–111)
Creatinine, Ser: 0.85 mg/dL (ref 0.61–1.24)
GFR, Estimated: >60 mL/min (ref >60)
Glucose, Bld: 119 mg/dL — ABNORMAL HIGH (ref 70–99)
Potassium: 4.1 mmol/L (ref 3.5–5.1)
Sodium: 138 mmol/L (ref 135–145)

## 2024-08-07 LAB — CBC WITH DIFFERENTIAL/PLATELET
Abs Immature Granulocytes: 0.04 K/uL (ref 0.00–0.07)
Basophils Absolute: 0 K/uL (ref 0.0–0.1)
Basophils Relative: 0 %
Eosinophils Absolute: 0 K/uL (ref 0.0–0.5)
Eosinophils Relative: 0 %
HCT: 49.5 % (ref 39.0–52.0)
Hemoglobin: 16.3 g/dL (ref 13.0–17.0)
Immature Granulocytes: 0 %
Lymphocytes Relative: 4 %
Lymphs Abs: 0.4 K/uL — ABNORMAL LOW (ref 0.7–4.0)
MCH: 32.6 pg (ref 26.0–34.0)
MCHC: 32.9 g/dL (ref 30.0–36.0)
MCV: 99 fL (ref 80.0–100.0)
Monocytes Absolute: 1.3 K/uL — ABNORMAL HIGH (ref 0.1–1.0)
Monocytes Relative: 13 %
Neutro Abs: 8.3 K/uL — ABNORMAL HIGH (ref 1.7–7.7)
Neutrophils Relative %: 83 %
Platelets: 198 K/uL (ref 150–400)
RBC: 5 MIL/uL (ref 4.22–5.81)
RDW: 14.8 % (ref 11.5–15.5)
WBC: 10 K/uL (ref 4.0–10.5)
nRBC: 0 % (ref 0.0–0.2)

## 2024-08-07 LAB — RESP PANEL BY RT-PCR (RSV, FLU A&B, COVID)  RVPGX2
Influenza A by PCR: NEGATIVE
Influenza B by PCR: NEGATIVE
Resp Syncytial Virus by PCR: NEGATIVE
SARS Coronavirus 2 by RT PCR: NEGATIVE

## 2024-08-07 LAB — BRAIN NATRIURETIC PEPTIDE: B Natriuretic Peptide: 425.3 pg/mL — ABNORMAL HIGH (ref 0.0–100.0)

## 2024-08-07 MED ORDER — ONDANSETRON HCL 4 MG/2ML IJ SOLN
4.0000 mg | Freq: Once | INTRAMUSCULAR | Status: AC
  Administered 2024-08-07: 4 mg via INTRAVENOUS
  Filled 2024-08-07: qty 2

## 2024-08-07 MED ORDER — GUAIFENESIN 100 MG/5ML PO LIQD: 100.0000 mg | ORAL | 0 refills | Status: DC | PRN

## 2024-08-07 MED ORDER — MAGNESIUM SULFATE 2 GM/50ML IV SOLN
2.0000 g | Freq: Once | INTRAVENOUS | Status: AC
  Administered 2024-08-07: 2 g via INTRAVENOUS
  Filled 2024-08-07: qty 50

## 2024-08-07 MED ORDER — AZITHROMYCIN 250 MG PO TABS: 250.0000 mg | ORAL_TABLET | Freq: Every day | ORAL | 0 refills | Status: DC

## 2024-08-07 MED ORDER — METHYLPREDNISOLONE SODIUM SUCC 125 MG IJ SOLR
125.0000 mg | Freq: Once | INTRAMUSCULAR | Status: AC
  Administered 2024-08-07: 125 mg via INTRAVENOUS
  Filled 2024-08-07: qty 2

## 2024-08-07 MED ORDER — IPRATROPIUM-ALBUTEROL 0.5-2.5 (3) MG/3ML IN SOLN
3.0000 mL | RESPIRATORY_TRACT | Status: AC
  Administered 2024-08-07 (×2): 3 mL via RESPIRATORY_TRACT
  Filled 2024-08-07: qty 6

## 2024-08-07 MED ORDER — IPRATROPIUM-ALBUTEROL 0.5-2.5 (3) MG/3ML IN SOLN: 3.0000 mL | Freq: Once | RESPIRATORY_TRACT | Status: DC

## 2024-08-07 MED ORDER — MORPHINE SULFATE (PF) 4 MG/ML IV SOLN
4.0000 mg | Freq: Once | INTRAVENOUS | Status: AC
  Administered 2024-08-07: 4 mg via INTRAVENOUS
  Filled 2024-08-07: qty 1

## 2024-08-07 MED ORDER — PREDNISONE 20 MG PO TABS: 40.0000 mg | ORAL_TABLET | Freq: Every day | ORAL | 0 refills | Status: AC

## 2024-08-07 NOTE — ED Triage Notes (Signed)
 Pt BIB EMS from home for SOB and chest pain, hx of lung cancer, COPD, CHF, 5L at baseline. Dropping to 85% on 5L. Since yesterday gotten worse and more SOB. 89% is usually normal. 1 DuoNeb with EMS. Rhonchi all over. Sitting at 95% w/ nonrebreather.

## 2024-08-07 NOTE — Discharge Instructions (Addendum)
 It was a pleasure caring for you today in the emergency department.  You have elected to leave the emergency department against medical advice prior to completing your care today.  Please return if you change your mind or you would like to complete your assessment but otherwise please follow-up with your PCP  Please return to the emergency department for any worsening or worrisome symptoms.

## 2024-08-07 NOTE — ED Notes (Signed)
 Patient called out asking for more oxygen , this RN let him know he was on 15L and that we could not turn it up higher. This RN then let the EDP know that patient was asking for more oxygen  and he came to  bedside to evaluate. Patient then told the EDP he wanted to leave. Risks were explained to patient and patient told RN and EDP he was going home to eat.

## 2024-08-07 NOTE — ED Provider Notes (Addendum)
 [place] EMERGENCY DEPARTMENT AT [facility] Provider Note  CSN: [id number] Arrival date & time: 08/07/24 1039  Chief Complaint(s) Shortness of Breath  HPI Richard [name] is a 53 y.o. male with past medical history as below, significant for COPD, VTE, GERD, HTN, cocaine use, chronic resp failure right sided lung cancer who presents to the ED with complaint of dib, cp.  Pt reports feeling unwell last 2-3 days, worsened last 24 hrs. Has been compliant w/ home meds. Coughing occ blood last day or so, right sided chest pain worse with movement or breathing/coughing. He reports exertional dyspnea. On EMS arrival pulse ox was low 80%'s despite using home o2, reports he usually runs around 89-90% on his pulse ox.  Pt agitated on arrival   Past Medical History Past Medical History:  Diagnosis Date   Asthma    Brain bleed (HCC)    DVT (deep venous thrombosis) (HCC) 02/19/2015   RLE   GERD (gastroesophageal reflux disease)    History of home oxygen  therapy    Hypertension    Seizures (HCC)    last episode 03/2013   Stroke [facility])    Patient Active Problem List   Diagnosis Date Noted   Elevated troponin 05/19/2024   Cocaine use 05/19/2024   Encounter for smoking cessation counseling 05/19/2024   Malignant neoplasm of overlapping sites of lung (HCC) 09/17/2023   Pneumonia of left lung due to infectious organism 09/17/2023   Acute on chronic respiratory failure with hypoxia (HCC) 09/17/2023   CAP (community acquired pneumonia) 09/16/2023   H/O: CVA (cerebrovascular accident) 09/16/2023   H/O deep vein thrombophlebitis of lower extremity 09/16/2023   Acute respiratory failure with hypoxia (HCC) 09/16/2023   Left leg swelling 01/15/2023   Knee pain, left 01/11/2023   Acute hypoxic respiratory failure (HCC) 01/10/2023   Acute bronchiolitis due to respiratory syncytial virus (RSV) 01/10/2023   History of seizures 01/10/2023   Hypertension 12/25/2022   Malignant neoplasm of right  lung (HCC) 12/25/2022   Rash 06/04/2022   Chronic respiratory failure with hypoxia (HCC) 06/04/2022   Mediastinal lymphadenopathy    Obstructive pneumonia    Counseling regarding advance care planning and goals of care    Malignant neoplasm of overlapping sites of right lung (HCC) 02/03/2022   COPD exacerbation (HCC) 01/28/2022   Right sided weakness 10/13/2021   Mass of right lung 04/09/2020   Alcohol abuse with intoxication (HCC) 03/04/2020   Hypoxia 03/04/2020   Swollen testicle    Atelectasis    Syncope 08/26/2015   Acute encephalopathy 08/26/2015   Seizure (HCC)    Acute pulmonary edema (HCC)    Lower leg DVT (deep venous thromboembolism), acute (HCC)    Protein-calorie malnutrition, severe (HCC) 02/19/2015   Bacteremia due to Staphylococcus 02/17/2015   Alcohol withdrawal delirium (HCC)    Altered mental status    Metabolic acidosis    Essential hypertension    Encephalopathy 02/11/2015   Alcohol intoxication (HCC) 02/11/2015   Lactic acidosis 02/11/2015   Bleeding from the nose 03/19/2014   TIA (transient ischemic attack) 12/24/2013   Weakness 12/23/2013   Left-sided weakness 12/23/2013   Malignant hypertension 12/23/2013   Seizure disorder (HCC) 12/23/2013   Vomiting 06/18/2013   Chest pain 06/18/2013   Hypertensive urgency 10/30/2012   Migraine variant 10/30/2012   TOBACCO ABUSE 01/11/2008   HYPERTENSION, BENIGN 11/24/2007   CHRONIC FRONTAL SINUSITIS 11/24/2007   Convulsions (HCC) 11/24/2007   Headache(784.0) 11/24/2007   Home Medication(s) Prior to Admission medications  Medication Sig Start Date End Date Taking? Authorizing Provider  azithromycin  (ZITHROMAX ) 250 MG tablet Take 1 tablet (250 mg total) by mouth daily. Take first 2 tablets together, then 1 every day until finished. 08/07/24  Yes Elnor Jayson LABOR, DO  guaiFENesin  (ROBITUSSIN) 100 MG/5ML liquid Take 5-10 mLs (100-200 mg total) by mouth every 4 (four) hours as needed for cough or to loosen phlegm.  08/07/24  Yes Elnor Jayson A, DO  predniSONE  (DELTASONE ) 20 MG tablet Take 2 tablets (40 mg total) by mouth daily for 5 days. 08/07/24 08/12/24 Yes Elnor Jayson A, DO  albuterol  (PROVENTIL ) (2.5 MG/3ML) 0.083% nebulizer solution Take 2.5 mg by nebulization 3 (three) times daily as needed for shortness of breath or wheezing. 05/11/24   [provider]  albuterol  (VENTOLIN  HFA) 108 (90 Base) MCG/ACT inhaler Inhale 2 puffs into the lungs every 6 (six) hours as needed for wheezing or shortness of breath. 09/24/23   Hunsucker, Donnice SAUNDERS, MD  albuterol  (VENTOLIN  HFA) 108 (90 Base) MCG/ACT inhaler Inhale 2 puffs into the lungs every 4 (four) hours as needed for wheezing or shortness of breath. 07/31/24   Cottie Donnice PARAS, MD  amLODipine  (NORVASC ) 10 MG tablet Take 10 mg by mouth every morning. 08/06/23   [provider]  Aspirin -Acetaminophen  (GOODYS BODY PAIN PO) Take 1 packet by mouth daily as needed (for pain).    [provider]  cloNIDine  (CATAPRES ) 0.1 MG tablet Take 0.1 mg by mouth. Take 2 tablets by mouth at 9am and 5pm and 1 tablet at bedtime    [provider]  Fluticasone -Umeclidin-Vilant (TRELEGY ELLIPTA ) 200-62.5-25 MCG/ACT AEPB Inhale 1 puff into the lungs daily. 09/24/23   Hunsucker, Donnice SAUNDERS, MD  furosemide  (LASIX ) 40 MG tablet Take 1 tablet (40 mg total) by mouth daily. 05/22/24 08/20/24  [place], Eric J, DO  hydrochlorothiazide  (HYDRODIURIL ) 12.5 MG tablet Take 12.5 mg by mouth every morning. 08/06/23   [provider]  levETIRAcetam  (KEPPRA ) 500 MG tablet Take 1 tablet (500 mg total) by mouth 2 (two) times daily. 01/17/23   Norrine Sharper, MD  losartan  (COZAAR ) 100 MG tablet Take 100 mg by mouth daily. 12/31/22   [provider]  sertraline  (ZOLOFT ) 25 MG tablet Take 25 mg by mouth daily. 07/23/23   [provider]                                                                                                                                     Past Surgical History Past Surgical History:  Procedure Laterality Date   BRONCHIAL BIOPSY  02/10/2022   Procedure: BRONCHIAL BIOPSIES;  Surgeon: Jude Harden GAILS, MD;  Location: [facility];  Service: Cardiopulmonary;;   BRONCHIAL BRUSHINGS  02/10/2022   Procedure: BRONCHIAL BRUSHINGS;  Surgeon: Jude Harden GAILS, MD;  Location: [facility];  Service: Cardiopulmonary;;   BRONCHIAL NEEDLE ASPIRATION BIOPSY  02/10/2022   Procedure: BRONCHIAL NEEDLE ASPIRATION BIOPSIES;  Surgeon: Jude Harden GAILS, MD;  Location: [facility];  Service: Cardiopulmonary;;   BRONCHIAL WASHINGS  02/10/2022   Procedure: BRONCHIAL WASHINGS;  Surgeon: Jude Harden GAILS, MD;  Location: [facility];  Service: Cardiopulmonary;;   ENDOBRONCHIAL ULTRASOUND Bilateral 02/10/2022   Procedure: ENDOBRONCHIAL ULTRASOUND;  Surgeon: Jude Harden GAILS, MD;  Location: [facility];  Service: Cardiopulmonary;  Laterality: Bilateral;   LEG SURGERY     VIDEO BRONCHOSCOPY  02/10/2022   Procedure: VIDEO BRONCHOSCOPY WITHOUT FLUORO;  Surgeon: Jude Harden GAILS, MD;  Location: [facility];  Service: Cardiopulmonary;;   Family History Family History  Problem Relation Age of Onset   Cancer Father    Diabetes Mellitus II Sister     Social History Social History   Tobacco Use   Smoking status: Every Day    Current packs/day: 1.00    Average packs/day: 1 pack/day for 38.6 years (38.6 ttl pk-yrs)    Types: Cigarettes    Start date: 1987   Smokeless tobacco: Never   Tobacco comments:    Pt states he smokes some days  Vaping Use   Vaping status: Never Used  Substance Use Topics   Alcohol use: Yes    Comment: 01/29/22- last time 6 days ago   Drug use: Not Currently    Types: Cocaine    Comment: 09/2021- last time for cocaine   Allergies Dilaudid  [hydromorphone  hcl] and Tape  Review of Systems A thorough review of systems was obtained and all systems are negative except as noted in the HPI and PMH.   Physical Exam Vital Signs  I have  reviewed the triage vital signs BP (!) 163/120   Pulse (!) 114   Resp (!) 22   SpO2 (!) 83%  Physical Exam Vitals and nursing note reviewed.  Constitutional:      General: He is not in acute distress.    Appearance: He is well-developed.  HENT:     Head: Normocephalic and atraumatic.     Right Ear: External ear normal.     Left Ear: External ear normal.     Mouth/Throat:     Mouth: Mucous membranes are moist.  Eyes:     General: No scleral icterus. Cardiovascular:     Rate and Rhythm: Normal rate and regular rhythm.     Pulses: Normal pulses.     Heart sounds: Normal heart sounds.  Pulmonary:     Effort: Pulmonary effort is normal. No respiratory distress.     Breath sounds: Normal breath sounds.     Comments: Trace wheeze b/l Coarse R > L Chest:    Abdominal:     General: Abdomen is flat.     Palpations: Abdomen is soft.     Tenderness: There is no abdominal tenderness.  Musculoskeletal:     Cervical back: No rigidity.     Right lower leg: No edema.     Left lower leg: No edema.  Skin:    General: Skin is warm and dry.     Capillary Refill: Capillary refill takes less than 2 seconds.  Neurological:     Mental Status: He is alert.  Psychiatric:        Mood and Affect: Mood normal.        Behavior: Behavior normal.     ED Results and Treatments Labs (all labs ordered are listed, but only abnormal results are displayed) Labs Reviewed  CBC WITH DIFFERENTIAL/PLATELET - Abnormal; Notable for the following components:      Result Value  Neutro Abs 8.3 (*)    Lymphs Abs 0.4 (*)    Monocytes Absolute 1.3 (*)    All other components within normal limits  RESP PANEL BY RT-PCR (RSV, FLU A&B, COVID)  RVPGX2  BASIC METABOLIC PANEL WITH GFR  BRAIN NATRIURETIC PEPTIDE  I-STAT VENOUS BLOOD GAS, ED  I-STAT CG4 LACTIC ACID, ED  TROPONIN I (HIGH SENSITIVITY)                                                                                                                           Radiology No results found.  Pertinent labs & imaging results that were available during my care of the patient were reviewed by me and considered in my medical decision making (see MDM for details).  Medications Ordered in ED Medications  ipratropium-albuterol  (DUONEB) 0.5-2.5 (3) MG/3ML nebulizer solution 3 mL (3 mLs Nebulization Given 08/07/24 1104)  methylPREDNISolone  sodium succinate (SOLU-MEDROL ) 125 mg/2 mL injection 125 mg (125 mg Intravenous Given 08/07/24 1108)  magnesium  sulfate IVPB 2 g 50 mL (0 g Intravenous Stopped 08/07/24 1139)  morphine  (PF) 4 MG/ML injection 4 mg (4 mg Intravenous Given 08/07/24 1108)  ondansetron  (ZOFRAN ) injection 4 mg (4 mg Intravenous Given 08/07/24 1107)                                                                                                                                     Procedures .Critical Care  Performed by: Elnor Jayson LABOR, DO Authorized by: Elnor Jayson LABOR, DO   Critical care provider statement:    Critical care time (minutes):  30   Critical care time was exclusive of:  Separately billable procedures and treating other patients   Critical care was necessary to treat or prevent imminent or life-threatening deterioration of the following conditions:  Respiratory failure   Critical care was time spent personally by me on the following activities:  Development of treatment plan with patient or surrogate, discussions with consultants, evaluation of patient's response to treatment, examination of patient, ordering and review of laboratory studies, ordering and review of radiographic studies, ordering and performing treatments and interventions, pulse oximetry, re-evaluation of patient's condition, review of old charts and obtaining history from patient or surrogate   (including critical care time)  Medical Decision Making / ED Course    Medical Decision Making:    Aldahir Litaker is a 53 y.o. male with  past medical history as  below, significant for COPD, VTE, GERD, HTN, cocaine use, chronic resp failure right sided lung cancer who presents to the ED with complaint of dib, cp.. The complaint involves an extensive differential diagnosis and also carries with it a high risk of complications and morbidity.  Serious etiology was considered. Ddx includes but is not limited to: Differential includes all life-threatening causes for chest pain. This includes but is not exclusive to acute coronary syndrome, aortic dissection, pulmonary embolism, cardiac tamponade, community-acquired pneumonia, pericarditis, musculoskeletal chest wall pain, etc.   Complete initial physical exam performed, notably the patient was in NAD, increased WOB noted but no hypoxia or conversational dyspnea.     Reviewed and confirmed nursing documentation for past medical history, family history, social history.  Vital signs reviewed.    Dyspnea Chest pain in setting of lung cancer COPD > - Home oxygen  dependent 6 L at all times >> increased wob and hypoxia, placed on NRB, pt refused NIPPV - Trace wheezing noted, coarse breath sounds.  Give further nebulized breathing treatments, Solu-Medrol , mag sulfate. - Patient w/ history of right sided lung cancer, he has right-sided chest pain, reports occ hemoptysis, he has increased risk for PE, CT chest PE was ordered - CBC and lactic acid resulted, remaining labs are pending, patient is requesting discharge and wants no further workup, increasingly agitated        11:48 AM I was called to the room per patient's request.  Patient reports that he is hungry, he is feeling better and he wants to go home so he can eat lunch.  I discussed with patient that his evaluation is not complete, patient reports that he does not care he wants to leave because he is hungry.  I informed the patient that I was concerned that if he left prematurely he would get much worse and could potentially die.  Patient reports that he  understands the risk of leaving and wants to leave immediately because he is very hungry and does not want to be here. Was not interested in food options available in the ED   The patient has requested to leave the ED against medical advice. I believe this patient is of sound mind and medical decision making capacity to refuse medical care. The patient is responding and asking questions appropriately. The patient is oriented to person, place and time. The patient is not psychotic, delusional, suicidal, homicidal or hallucinating. The patient demonstrates a normal mental capacity to make decisions regarding their healthcare. The patient is clinically sober and does not appear to be under the influence of any illicit drugs at this time. The patient has been advised of the risks, in layman terms, of leaving AMA which include, but are not limited to death, coma, permanent disability, loss of current lifestyle, delay in diagnosis. Alternatives have been offered - the patient remains steadfast in their wish to leave. The patient has been advised that should they change their mind they are welcome to return to this hospital, or any other, at any time. The patient understands that in no way does an AMA discharge mean that I do not want them to have the best medical care available. To this end, I have offered appropriate prescriptions, referrals, and discharge instructions. The patient did sign AMA paperwork. The above discussion was witnessed by another member of staff.                 Additional history obtained: -Additional history obtained from  EMS -External records from outside source obtained and reviewed including: Chart review including previous notes, labs, imaging, consultation notes including  Prior er eval Home meds Prior labs   Lab Tests: -I ordered, reviewed, and interpreted labs.   The pertinent results include:   Labs Reviewed  CBC WITH DIFFERENTIAL/PLATELET - Abnormal; Notable  for the following components:      Result Value   Neutro Abs 8.3 (*)    Lymphs Abs 0.4 (*)    Monocytes Absolute 1.3 (*)    All other components within normal limits  RESP PANEL BY RT-PCR (RSV, FLU A&B, COVID)  RVPGX2  BASIC METABOLIC PANEL WITH GFR  BRAIN NATRIURETIC PEPTIDE  I-STAT VENOUS BLOOD GAS, ED  I-STAT CG4 LACTIC ACID, ED  TROPONIN I (HIGH SENSITIVITY)    Notable for CBC stable, LA stable > remainder labs pending  EKG   EKG Interpretation Date/Time:  Monday August 07 2024 10:49:55 EDT Ventricular Rate:  114 PR Interval:  122 QRS Duration:  98 QT Interval:  340 QTC Calculation: 469 R Axis:   68  Text Interpretation: Sinus tachycardia Atrial premature complex Anteroseptal infarct, age indeterminate Confirmed by Elnor Savant ([id number]) on 08/07/2024 10:53:47 AM         Imaging Studies ordered: I ordered imaging studies including CXR CTPE >CXR pending at time of AMA, CT PE was not completed   Medicines ordered and prescription drug management: Meds ordered this encounter  Medications   DISCONTD: ipratropium-albuterol  (DUONEB) 0.5-2.5 (3) MG/3ML nebulizer solution 3 mL   methylPREDNISolone  sodium succinate (SOLU-MEDROL ) 125 mg/2 mL injection 125 mg   magnesium  sulfate IVPB 2 g 50 mL   morphine  (PF) 4 MG/ML injection 4 mg   ondansetron  (ZOFRAN ) injection 4 mg   ipratropium-albuterol  (DUONEB) 0.5-2.5 (3) MG/3ML nebulizer solution 3 mL   azithromycin  (ZITHROMAX ) 250 MG tablet    Sig: Take 1 tablet (250 mg total) by mouth daily. Take first 2 tablets together, then 1 every day until finished.    Dispense:  6 tablet    Refill:  0   predniSONE  (DELTASONE ) 20 MG tablet    Sig: Take 2 tablets (40 mg total) by mouth daily for 5 days.    Dispense:  10 tablet    Refill:  0   guaiFENesin  (ROBITUSSIN) 100 MG/5ML liquid    Sig: Take 5-10 mLs (100-200 mg total) by mouth every 4 (four) hours as needed for cough or to loosen phlegm.    Dispense:  60 mL    Refill:  0    -I have  reviewed the patients home medicines and have made adjustments as needed   Consultations Obtained: na   Cardiac Monitoring: The patient was maintained on a cardiac monitor.  I personally viewed and interpreted the cardiac monitored which showed an underlying rhythm of: sinus tachy Continuous pulse oximetry interpreted by myself, 90% on 15L.    Social Determinants of Health:  Diagnosis or treatment significantly limited by social determinants of health: current smoker Counseled patient for approximately 3 minutes regarding smoking cessation. Discussed risks of smoking and how they applied and affected their visit here today. Patient not ready to quit at this time, however will follow up with their primary doctor when they are.   CPT code: 00593: intermediate counseling for smoking cessation     Reevaluation: After the interventions noted above, I reevaluated the patient and found that they have improved  Co morbidities that complicate the patient evaluation  Past Medical History:  Diagnosis Date  Asthma    Brain bleed (HCC)    DVT (deep venous thrombosis) (HCC) 02/19/2015   RLE   GERD (gastroesophageal reflux disease)    History of home oxygen  therapy    Hypertension    Seizures (HCC)    last episode 03/2013   Stroke [facility])       Dispostion: Disposition decision including need for hospitalization was considered, and patient Left against medical advice    Final Clinical Impression(s) / ED Diagnoses Final diagnoses:  COPD exacerbation (HCC)  Chest pain, unspecified type  Non compliance with medical treatment        Elnor Jayson LABOR, DO 08/07/24 1148    Elnor Jayson A, DO 08/07/24 1155

## 2024-08-12 ENCOUNTER — Other Ambulatory Visit: Payer: Self-pay

## 2024-08-12 ENCOUNTER — Emergency Department (HOSPITAL_COMMUNITY)
Admission: EM | Admit: 2024-08-12 | Discharge: 2024-08-12 | Disposition: A | Discharge status: Home / Self Care | Attending: Emergency Medicine | Admitting: Emergency Medicine

## 2024-08-12 DIAGNOSIS — I10 Essential (primary) hypertension: Secondary | ICD-10-CM | POA: Diagnosis not present

## 2024-08-12 DIAGNOSIS — Z79899 Other long term (current) drug therapy: Secondary | ICD-10-CM | POA: Diagnosis not present

## 2024-08-12 DIAGNOSIS — J441 Chronic obstructive pulmonary disease with (acute) exacerbation: Secondary | ICD-10-CM | POA: Diagnosis not present

## 2024-08-12 DIAGNOSIS — Z7982 Long term (current) use of aspirin: Secondary | ICD-10-CM | POA: Insufficient documentation

## 2024-08-12 DIAGNOSIS — Z7951 Long term (current) use of inhaled steroids: Secondary | ICD-10-CM | POA: Diagnosis not present

## 2024-08-12 DIAGNOSIS — R0602 Shortness of breath: Secondary | ICD-10-CM | POA: Diagnosis present

## 2024-08-12 DIAGNOSIS — Z9981 Dependence on supplemental oxygen: Secondary | ICD-10-CM | POA: Diagnosis not present

## 2024-08-12 DIAGNOSIS — Z5329 Procedure and treatment not carried out because of patient's decision for other reasons: Secondary | ICD-10-CM | POA: Insufficient documentation

## 2024-08-12 NOTE — ED Provider Notes (Signed)
 [place] EMERGENCY DEPARTMENT AT [facility] Provider Note   CSN: [id number] Arrival date & time: 08/12/24  1600     Patient presents with: Shortness of Breath   Richard [name] is a 53 y.o. male.   Patient brought in by EMS.  Complaint was shortness of breath.  He got extra short of breath when he was in a yelling match with someone got worked up.  His initial oxygen  sats were 82% on 6 L.  Patient normally on 4 L nasal cannula baseline.  Patient here oxygen  sats on his normal amount of oxygen  were kind of 89%.  He states that that is normal for him.  EMS did give him Solu-Medrol  and a DuoNeb.  Patient states he feels completely better now even though his oxygen  sats are ideal and he wants to leave.  He had told EMS he was going to leave if they brought him in.  Past medical history sniffer hypertension seizures gastroesophageal reflux disease deep vein thrombosis in 2016.  Home oxygen  therapy.  Patient last seen August 25 for COPD exacerbation at [facility] he left AMA that time.  He was seen August 18 for shortness of breath he left AMA that time as well.       Prior to Admission medications   Medication Sig Start Date End Date Taking? Authorizing Provider  albuterol  (PROVENTIL ) (2.5 MG/3ML) 0.083% nebulizer solution Take 2.5 mg by nebulization 3 (three) times daily as needed for shortness of breath or wheezing. 05/11/24   [provider]  albuterol  (VENTOLIN  HFA) 108 (90 Base) MCG/ACT inhaler Inhale 2 puffs into the lungs every 6 (six) hours as needed for wheezing or shortness of breath. 09/24/23   Hunsucker, Donnice SAUNDERS, MD  albuterol  (VENTOLIN  HFA) 108 (90 Base) MCG/ACT inhaler Inhale 2 puffs into the lungs every 4 (four) hours as needed for wheezing or shortness of breath. 07/31/24   Cottie Donnice PARAS, MD  amLODipine  (NORVASC ) 10 MG tablet Take 10 mg by mouth every morning. 08/06/23   [provider]  Aspirin -Acetaminophen  (GOODYS BODY PAIN PO) Take 1 packet by  mouth daily as needed (for pain).    [provider]  azithromycin  (ZITHROMAX ) 250 MG tablet Take 1 tablet (250 mg total) by mouth daily. Take first 2 tablets together, then 1 every day until finished. 08/07/24   Elnor Jayson LABOR, DO  cloNIDine  (CATAPRES ) 0.1 MG tablet Take 0.1 mg by mouth. Take 2 tablets by mouth at 9am and 5pm and 1 tablet at bedtime    [provider]  Fluticasone -Umeclidin-Vilant (TRELEGY ELLIPTA ) 200-62.5-25 MCG/ACT AEPB Inhale 1 puff into the lungs daily. 09/24/23   Hunsucker, Donnice SAUNDERS, MD  furosemide  (LASIX ) 40 MG tablet Take 1 tablet (40 mg total) by mouth daily. 05/22/24 08/20/24  [place], Camellia PARAS, DO  guaiFENesin  (ROBITUSSIN) 100 MG/5ML liquid Take 5-10 mLs (100-200 mg total) by mouth every 4 (four) hours as needed for cough or to loosen phlegm. 08/07/24   Elnor Jayson LABOR, DO  hydrochlorothiazide  (HYDRODIURIL ) 12.5 MG tablet Take 12.5 mg by mouth every morning. 08/06/23   [provider]  levETIRAcetam  (KEPPRA ) 500 MG tablet Take 1 tablet (500 mg total) by mouth 2 (two) times daily. 01/17/23   Norrine Sharper, MD  losartan  (COZAAR ) 100 MG tablet Take 100 mg by mouth daily. 12/31/22   [provider]  predniSONE  (DELTASONE ) 20 MG tablet Take 2 tablets (40 mg total) by mouth daily for 5 days. 08/07/24 08/12/24  Elnor Jayson LABOR, DO  sertraline  (ZOLOFT ) 25 MG tablet Take 25 mg by mouth daily. 07/23/23   [provider]    Allergies: Dilaudid  [hydromorphone  hcl] and Tape    Review of Systems  Constitutional:  Negative for chills and fever.  HENT:  Negative for ear pain and sore throat.   Eyes:  Negative for pain and visual disturbance.  Respiratory:  Positive for shortness of breath and wheezing. Negative for cough.   Cardiovascular:  Negative for chest pain and palpitations.  Gastrointestinal:  Negative for abdominal pain and vomiting.  Genitourinary:  Negative for dysuria and hematuria.  Musculoskeletal:  Negative for arthralgias and back  pain.  Skin:  Negative for color change and rash.  Neurological:  Negative for seizures and syncope.  All other systems reviewed and are negative.   Updated Vital Signs BP (!) 162/108 (BP Location: Right Arm)   Pulse (!) 103   Temp 97.6 F (36.4 C) (Axillary)   Resp (!) 21   SpO2 100%   Physical Exam Vitals and nursing note reviewed.  Constitutional:      General: He is not in acute distress.    Appearance: Normal appearance. He is well-developed.  HENT:     Head: Normocephalic and atraumatic.  Eyes:     Conjunctiva/sclera: Conjunctivae normal.  Cardiovascular:     Rate and Rhythm: Normal rate and regular rhythm.     Heart sounds: No murmur heard. Pulmonary:     Effort: Pulmonary effort is normal. No respiratory distress.     Breath sounds: Wheezing present.  Abdominal:     Palpations: Abdomen is soft.     Tenderness: There is no abdominal tenderness.  Musculoskeletal:        General: No swelling.     Cervical back: Neck supple.  Skin:    General: Skin is warm and dry.     Capillary Refill: Capillary refill takes less than 2 seconds.  Neurological:     General: No focal deficit present.     Mental Status: He is alert and oriented to person, place, and time.  Psychiatric:        Mood and Affect: Mood normal.     (all labs ordered are listed, but only abnormal results are displayed) Labs Reviewed - No data to display  EKG: None  Radiology: No results found.   Procedures   Medications Ordered in the ED - No data to display                                  Medical Decision Making  Patient insist on leaving AMA.  Some concerned about his oxygen  sats being a little below 90 on his normal amount of oxygen .  Still has a little bit of wheezing.  However it is apparent that patient has a tendency to leave AMA because he left AMA twice before this month already left AMA on August 25 and August 18.  Final diagnoses:  COPD exacerbation [facility])    ED Discharge  Orders     None          Geraldene Hamilton, MD 08/12/24 1650

## 2024-08-12 NOTE — ED Triage Notes (Signed)
 [facility] bringing patient in complaing of SOB. He says he became SOB because he was in a yelling match with someone and got worked up. With EMS he was initially 82% on 6L  He received a duoneb and 125 of solumedrol with EMS.  The patient states he feels better and wants to go home.   Currently trialing baseline 4L [place].    EMS vitals:  HR 110 BP 150/70 RR 28 O2 94% on 15L duoneb  20G LAC

## 2024-08-12 NOTE — ED Notes (Signed)
 Immediately upon settling the patient into his room, on the monitor, etc, the pt stated he would like to leave.  I educated the patient he has an increased oxygen  requirement at this time and he should stay in the hospital.   The patient is adamant that he would like to leave, and states his wife is already outside with his oxygen  and he is ready to go.   The patient insisted I remove his IV, remove all monitoring devices, and allow him to sign the AMA forms.  Several attempts were made to educate the patient on the importance of completing treatment.   Steps were then taken to release the patient at his request. AMA form was signed and filed to medical records.   By the time the patient was leaving he was satting at 88% on 4L which he states is his baseline.   I ensured the patient had oxygen  in his wives vehicle at the front of the ER entrance and assisted the patient into the vehicle without difficulties. No overt respiratory distress noted, VSS.

## 2024-08-15 ENCOUNTER — Encounter (HOSPITAL_COMMUNITY): Payer: Self-pay

## 2024-08-15 ENCOUNTER — Emergency Department (HOSPITAL_COMMUNITY)

## 2024-08-15 ENCOUNTER — Emergency Department (HOSPITAL_COMMUNITY)
Admission: EM | Admit: 2024-08-15 | Discharge: 2024-08-15 | Disposition: A | Discharge status: Home / Self Care | Source: Home / Self Care | Attending: Emergency Medicine | Admitting: Emergency Medicine

## 2024-08-15 ENCOUNTER — Other Ambulatory Visit: Payer: Self-pay

## 2024-08-15 ENCOUNTER — Emergency Department (HOSPITAL_COMMUNITY)
Admission: EM | Admit: 2024-08-15 | Discharge: 2024-08-15 | Discharge status: Against Medical Advice | Attending: Emergency Medicine | Admitting: Emergency Medicine

## 2024-08-15 DIAGNOSIS — Z7951 Long term (current) use of inhaled steroids: Secondary | ICD-10-CM | POA: Insufficient documentation

## 2024-08-15 DIAGNOSIS — J449 Chronic obstructive pulmonary disease, unspecified: Secondary | ICD-10-CM | POA: Diagnosis not present

## 2024-08-15 DIAGNOSIS — R600 Localized edema: Secondary | ICD-10-CM | POA: Insufficient documentation

## 2024-08-15 DIAGNOSIS — I10 Essential (primary) hypertension: Secondary | ICD-10-CM | POA: Insufficient documentation

## 2024-08-15 DIAGNOSIS — Z5329 Procedure and treatment not carried out because of patient's decision for other reasons: Secondary | ICD-10-CM | POA: Diagnosis not present

## 2024-08-15 DIAGNOSIS — R0600 Dyspnea, unspecified: Secondary | ICD-10-CM | POA: Insufficient documentation

## 2024-08-15 DIAGNOSIS — F10929 Alcohol use, unspecified with intoxication, unspecified: Secondary | ICD-10-CM | POA: Diagnosis present

## 2024-08-15 DIAGNOSIS — R0602 Shortness of breath: Secondary | ICD-10-CM | POA: Diagnosis present

## 2024-08-15 DIAGNOSIS — Z7982 Long term (current) use of aspirin: Secondary | ICD-10-CM | POA: Insufficient documentation

## 2024-08-15 DIAGNOSIS — R000 Tachycardia, unspecified: Secondary | ICD-10-CM | POA: Insufficient documentation

## 2024-08-15 DIAGNOSIS — I509 Heart failure, unspecified: Secondary | ICD-10-CM | POA: Insufficient documentation

## 2024-08-15 DIAGNOSIS — Z79899 Other long term (current) drug therapy: Secondary | ICD-10-CM | POA: Insufficient documentation

## 2024-08-15 DIAGNOSIS — J441 Chronic obstructive pulmonary disease with (acute) exacerbation: Secondary | ICD-10-CM | POA: Insufficient documentation

## 2024-08-15 LAB — BASIC METABOLIC PANEL WITH GFR
Anion gap: 9 (ref 5–15)
BUN: 6 mg/dL (ref 6–20)
CO2: 24 mmol/L (ref 22–32)
Calcium: 8.4 mg/dL — ABNORMAL LOW (ref 8.9–10.3)
Chloride: 109 mmol/L (ref 98–111)
Creatinine, Ser: 0.8 mg/dL (ref 0.61–1.24)
GFR, Estimated: >60 mL/min (ref >60)
Glucose, Bld: 80 mg/dL (ref 70–99)
Potassium: 4.1 mmol/L (ref 3.5–5.1)
Sodium: 142 mmol/L (ref 135–145)

## 2024-08-15 LAB — CBC
HCT: 51.3 % (ref 39.0–52.0)
Hemoglobin: 16.5 g/dL (ref 13.0–17.0)
MCH: 31.9 pg (ref 26.0–34.0)
MCHC: 32.2 g/dL (ref 30.0–36.0)
MCV: 99.2 fL (ref 80.0–100.0)
Platelets: 391 K/uL (ref 150–400)
RBC: 5.17 MIL/uL (ref 4.22–5.81)
RDW: 14.5 % (ref 11.5–15.5)
WBC: 7.2 K/uL (ref 4.0–10.5)
nRBC: 0 % (ref 0.0–0.2)

## 2024-08-15 LAB — I-STAT CHEM 8, ED
BUN: 6 mg/dL (ref 6–20)
Calcium, Ion: 1.06 mmol/L — ABNORMAL LOW (ref 1.15–1.40)
Chloride: 109 mmol/L (ref 98–111)
Creatinine, Ser: 1 mg/dL (ref 0.61–1.24)
Glucose, Bld: 79 mg/dL (ref 70–99)
HCT: 53 % — ABNORMAL HIGH (ref 39.0–52.0)
Hemoglobin: 18 g/dL — ABNORMAL HIGH (ref 13.0–17.0)
Potassium: 4.3 mmol/L (ref 3.5–5.1)
Sodium: 143 mmol/L (ref 135–145)
TCO2: 22 mmol/L (ref 22–32)

## 2024-08-15 MED ORDER — PREDNISONE 20 MG PO TABS: 60.0000 mg | ORAL_TABLET | Freq: Once | ORAL | Status: DC

## 2024-08-15 MED ORDER — IPRATROPIUM-ALBUTEROL 0.5-2.5 (3) MG/3ML IN SOLN: 3.0000 mL | Freq: Once | RESPIRATORY_TRACT | Status: DC

## 2024-08-15 MED ORDER — FUROSEMIDE 10 MG/ML IJ SOLN: 40.0000 mg | INTRAMUSCULAR | Status: DC

## 2024-08-15 NOTE — ED Provider Notes (Signed)
 [place] EMERGENCY DEPARTMENT AT [facility] Provider Note   CSN: [id number] Arrival date & time: 08/15/24  2121     Patient presents with: Alcohol Intoxication   Jaekwon Mcdaid is a 53 y.o. male.   53 y.o. male with past medical history significant for chronic hypoxic respiratory failure/COPD on 4-5L O2, Stage IIIB NSCLC s/p palliative radiation, chemotherapy and immunotherapy stopped due to nonadherence with treatment, current smoker, hx substance use (cocaine), hx DVT completed AC, Hx TBI, prior hx ICH, seizure disorder, diastolic dysfunction, aortic aneurysm, HTN, mood d/o presents with shortness of breath.  He reports that he has been wearing 6 L of O2 at home recently.  As I enter the room, the patient is on the side of his stretcher.  He is demanding to go home.  He is not interested in treatment apparently.  I believe that I have convinced him to at least get a breathing treatment a chest x-ray, and a dose of prednisone .  His presentation is perhaps most consistent with COPD exacerbation.  However, patient is not allowing IV, labs, workup.  He has capacity refuse care.  The history is provided by the patient and medical records.       Prior to Admission medications   Medication Sig Start Date End Date Taking? Authorizing Provider  albuterol  (PROVENTIL ) (2.5 MG/3ML) 0.083% nebulizer solution Take 2.5 mg by nebulization 3 (three) times daily as needed for shortness of breath or wheezing. 05/11/24   [provider]  albuterol  (VENTOLIN  HFA) 108 (90 Base) MCG/ACT inhaler Inhale 2 puffs into the lungs every 6 (six) hours as needed for wheezing or shortness of breath. 09/24/23   Hunsucker, Donnice SAUNDERS, MD  albuterol  (VENTOLIN  HFA) [phone] Base) MCG/ACT inhaler Inhale 2 puffs into the lungs every 4 (four) hours as needed for wheezing or shortness of breath. 07/31/24   Cottie Donnice PARAS, MD  amLODipine  (NORVASC ) 10 MG tablet Take 10 mg by mouth every morning. 08/06/23    [provider]  Aspirin -Acetaminophen  (GOODYS BODY PAIN PO) Take 1 packet by mouth daily as needed (for pain).    [provider]  azithromycin  (ZITHROMAX ) 250 MG tablet Take 1 tablet (250 mg total) by mouth daily. Take first 2 tablets together, then 1 every day until finished. 08/07/24   Elnor Jayson LABOR, DO  cloNIDine  (CATAPRES ) 0.1 MG tablet Take 0.1 mg by mouth. Take 2 tablets by mouth at 9am and 5pm and 1 tablet at bedtime    [provider]  Fluticasone -Umeclidin-Vilant (TRELEGY ELLIPTA ) 200-62.5-25 MCG/ACT AEPB Inhale 1 puff into the lungs daily. 09/24/23   Hunsucker, Donnice SAUNDERS, MD  furosemide  (LASIX ) 40 MG tablet Take 1 tablet (40 mg total) by mouth daily. 05/22/24 08/20/24  [place], Camellia PARAS, DO  guaiFENesin  (ROBITUSSIN) 100 MG/5ML liquid Take 5-10 mLs (100-200 mg total) by mouth every 4 (four) hours as needed for cough or to loosen phlegm. 08/07/24   Elnor Jayson LABOR, DO  hydrochlorothiazide  (HYDRODIURIL ) 12.5 MG tablet Take 12.5 mg by mouth every morning. 08/06/23   [provider]  levETIRAcetam  (KEPPRA ) 500 MG tablet Take 1 tablet (500 mg total) by mouth 2 (two) times daily. 01/17/23   Norrine Sharper, MD  losartan  (COZAAR ) 100 MG tablet Take 100 mg by mouth daily. 12/31/22   [provider]  sertraline  (ZOLOFT ) 25 MG tablet Take 25 mg by mouth daily. 07/23/23   [provider]    Allergies: Dilaudid  [hydromorphone  hcl] and Tape    Review of  Systems  All other systems reviewed and are negative.   Updated Vital Signs BP (!) 184/107   Pulse 100   Temp 97.9 F (36.6 C)   Resp (!) 24   Ht 5' 9 (1.753 m)   Wt 102.1 kg   SpO2 96%   BMI 33.23 kg/m   Physical Exam Vitals and nursing note reviewed.  Constitutional:      General: He is not in acute distress.    Appearance: Normal appearance. He is well-developed.  HENT:     Head: Normocephalic and atraumatic.  Eyes:     Conjunctiva/sclera: Conjunctivae normal.     Pupils: Pupils are  equal, round, and reactive to light.  Cardiovascular:     Rate and Rhythm: Regular rhythm. Tachycardia present.     Heart sounds: Normal heart sounds.  Pulmonary:     Effort: Pulmonary effort is normal. No respiratory distress.     Comments: Diffuse expiratory wheezes in all lung fields Abdominal:     General: There is no distension.     Palpations: Abdomen is soft.     Tenderness: There is no abdominal tenderness.  Musculoskeletal:        General: No deformity. Normal range of motion.     Cervical back: Normal range of motion and neck supple.  Skin:    General: Skin is warm and dry.  Neurological:     General: No focal deficit present.     Mental Status: He is alert and oriented to person, place, and time.     (all labs ordered are listed, but only abnormal results are displayed) Labs Reviewed - No data to display  EKG: None  Radiology: No results found.   Procedures   Medications Ordered in the ED  ipratropium-albuterol  (DUONEB) 0.5-2.5 (3) MG/3ML nebulizer solution 3 mL (has no administration in time range)  predniSONE  (DELTASONE ) tablet 60 mg (has no administration in time range)                                    Medical Decision Making Patient with multiple comorbidities presents with shortness of breath.  At baseline he uses approximately 6 L of O2.  Patient refused treatment.  He has capacity refuse treatment.  Patient leaves AGAINST MEDICAL ADVICE.  He has done this left AMA on every visit to the ED for the last month.  Amount and/or Complexity of Data Reviewed Radiology: ordered.  Risk Prescription drug management.        Final diagnoses:  Dyspnea, unspecified type    ED Discharge Orders     None          Laurice Maude BROCKS, MD 08/15/24 2223

## 2024-08-15 NOTE — ED Notes (Signed)
 Went to medicate patient, patient adamant he wants to leave. Patient is AO x 4. Patient refused to sign AMA paperwork. Patient wheeled out by tech.

## 2024-08-15 NOTE — ED Notes (Signed)
 Upon arrival to room, pt stated he wanted to leave. Pt proceeded to remove call bell from lap.

## 2024-08-15 NOTE — ED Triage Notes (Signed)
 PT brought in by [facility], PT called EMS due to SOB. PT states he has some edema on his lower legs. PT states he wears 6L of o2 at home. PT is very agitated and states he wants to leave. PT refused multiple things from EMS, PT states he takes diuretics at home unsure of what or when he took it.

## 2024-08-15 NOTE — ED Triage Notes (Signed)
 Pt returns to check in cursing at staff and mad states he never wanted to be dc from the hospital that he still very SOB and needing O2 [place]. Per staff pt was refusing all kind of treatment on the back.  Pt able to talk in complete sentences during triage.

## 2024-08-15 NOTE — ED Provider Notes (Signed)
 [facility]EMERGENCY DEPT [facility] Emergency Department Provider Note MRN:  [id number]  Arrival date & time: 08/15/24     Chief Complaint   Shortness of Breath   History of Present Illness   Richard [name] is a 53 y.o. year-old male presents to the ED with chief complaint of SOB.  Just left AMA.  He now states that he still feels short of breath and wants to get better.  He states he wants to feel better.  He wears 6L O2 at baseline.  His O2 sat in triage is 91%  History provided by patient.   Review of Systems  Pertinent positive and negative review of systems noted in HPI.    Physical Exam   Vitals:   08/15/24 2244  BP: (!) 180/139  Pulse: (!) 106  Resp: 19  Temp: 98.3 F (36.8 C)  SpO2: 91%    CONSTITUTIONAL:  non toxic-appearing, NAD NEURO:  Alert and oriented x 3, CN 3-12 grossly intact EYES:  eyes equal and reactive ENT/NECK:  Supple, no stridor  CARDIO:  mildly tachycardic, regular rhythm, appears well-perfused  PULM:  mildly increased work of breathing, diminished, speaks in complete sentences GI/GU:  non-distended,  MSK/SPINE:  No gross deformities, bilateral lower extremity edema, moves all extremities  SKIN:  no rash, atraumatic   *Additional and/or pertinent findings included in MDM below  Diagnostic and Interventional Summary    EKG Interpretation Date/Time:    Ventricular Rate:    PR Interval:    QRS Duration:    QT Interval:    QTC Calculation:   R Axis:      Text Interpretation:         Labs Reviewed  BASIC METABOLIC PANEL WITH GFR - Abnormal; Notable for the following components:      Result Value   Calcium 8.4 (*)    All other components within normal limits  I-STAT CHEM 8, ED - Abnormal; Notable for the following components:   Calcium, Ion 1.06 (*)    Hemoglobin 18.0 (*)    HCT 53.0 (*)    All other components within normal limits  CBC  BRAIN NATRIURETIC PEPTIDE  TROPONIN I (HIGH SENSITIVITY)    No orders to display     Medications  furosemide  (LASIX ) injection 40 mg (has no administration in time range)     Procedures  /  Critical Care Procedures  ED Course and Medical Decision Making  I have reviewed the triage vital signs, the nursing notes, and pertinent available records from the EMR.  Social Determinants Affecting Complexity of Care: Patient has no clinically significant social determinants affecting this chief complaint..   ED Course:    Medical Decision Making Patient turns with shortness of breath.  Could be COPD versus CHF exacerbation.  He is alert and oriented.  He speaks in complete sentences.  O2 sat is 91% on 6 L nasal cannula, which is his baseline.  I will consult hospitalist for admission.  Chest x-ray done 3 hours ago.  Notable for increased hilar opacities.    Amount and/or Complexity of Data Reviewed Labs: ordered. Radiology: ordered. ECG/medicine tests: ordered.  Risk Prescription drug management.         Consultants:    Treatment and Plan: Will admit patient for SOB and probable COPD exacerbation.    11:54 PM Notified by RN that patient requested to be wheeled out and discharged.  Patient just did the same thing a couple of hours ago.  I do think that  the patient would benefit from admission, but he does have capacity to make his own medical decisions and I can't hold him here against his will.      Final Clinical Impressions(s) / ED Diagnoses  No diagnosis found.  ED Discharge Orders     None         Discharge Instructions Discussed with and Provided to Patient:   Discharge Instructions   None      Vicky Charleston, PA-C 08/15/24 2354    Carita Senior, MD 08/16/24 [phone]

## 2024-08-21 ENCOUNTER — Emergency Department (HOSPITAL_COMMUNITY)

## 2024-08-21 ENCOUNTER — Inpatient Hospital Stay (HOSPITAL_COMMUNITY)
Admission: EM | Admit: 2024-08-21 | Discharge: 2024-08-22 | DRG: 291 | Discharge status: Against Medical Advice | Attending: Internal Medicine | Admitting: Internal Medicine

## 2024-08-21 DIAGNOSIS — G40909 Epilepsy, unspecified, not intractable, without status epilepticus: Secondary | ICD-10-CM | POA: Diagnosis present

## 2024-08-21 DIAGNOSIS — Z5329 Procedure and treatment not carried out because of patient's decision for other reasons: Secondary | ICD-10-CM | POA: Diagnosis present

## 2024-08-21 DIAGNOSIS — I110 Hypertensive heart disease with heart failure: Secondary | ICD-10-CM | POA: Diagnosis not present

## 2024-08-21 DIAGNOSIS — I2489 Other forms of acute ischemic heart disease: Secondary | ICD-10-CM | POA: Diagnosis present

## 2024-08-21 DIAGNOSIS — J441 Chronic obstructive pulmonary disease with (acute) exacerbation: Secondary | ICD-10-CM | POA: Diagnosis present

## 2024-08-21 DIAGNOSIS — Z85118 Personal history of other malignant neoplasm of bronchus and lung: Secondary | ICD-10-CM

## 2024-08-21 DIAGNOSIS — Z833 Family history of diabetes mellitus: Secondary | ICD-10-CM

## 2024-08-21 DIAGNOSIS — I509 Heart failure, unspecified: Principal | ICD-10-CM

## 2024-08-21 DIAGNOSIS — F1490 Cocaine use, unspecified, uncomplicated: Secondary | ICD-10-CM | POA: Diagnosis present

## 2024-08-21 DIAGNOSIS — I5033 Acute on chronic diastolic (congestive) heart failure: Secondary | ICD-10-CM | POA: Diagnosis present

## 2024-08-21 DIAGNOSIS — Z91048 Other nonmedicinal substance allergy status: Secondary | ICD-10-CM

## 2024-08-21 DIAGNOSIS — M7989 Other specified soft tissue disorders: Secondary | ICD-10-CM | POA: Diagnosis present

## 2024-08-21 DIAGNOSIS — R7989 Other specified abnormal findings of blood chemistry: Secondary | ICD-10-CM | POA: Diagnosis present

## 2024-08-21 DIAGNOSIS — F1910 Other psychoactive substance abuse, uncomplicated: Secondary | ICD-10-CM | POA: Diagnosis present

## 2024-08-21 DIAGNOSIS — J9621 Acute and chronic respiratory failure with hypoxia: Secondary | ICD-10-CM | POA: Diagnosis not present

## 2024-08-21 DIAGNOSIS — Z7951 Long term (current) use of inhaled steroids: Secondary | ICD-10-CM

## 2024-08-21 DIAGNOSIS — F17210 Nicotine dependence, cigarettes, uncomplicated: Secondary | ICD-10-CM | POA: Diagnosis present

## 2024-08-21 DIAGNOSIS — I160 Hypertensive urgency: Secondary | ICD-10-CM | POA: Diagnosis present

## 2024-08-21 DIAGNOSIS — I5030 Unspecified diastolic (congestive) heart failure: Secondary | ICD-10-CM | POA: Diagnosis present

## 2024-08-21 DIAGNOSIS — Z923 Personal history of irradiation: Secondary | ICD-10-CM

## 2024-08-21 DIAGNOSIS — Z79899 Other long term (current) drug therapy: Secondary | ICD-10-CM

## 2024-08-21 DIAGNOSIS — Z9221 Personal history of antineoplastic chemotherapy: Secondary | ICD-10-CM

## 2024-08-21 DIAGNOSIS — Z885 Allergy status to narcotic agent status: Secondary | ICD-10-CM

## 2024-08-21 DIAGNOSIS — Z8673 Personal history of transient ischemic attack (TIA), and cerebral infarction without residual deficits: Secondary | ICD-10-CM

## 2024-08-21 DIAGNOSIS — Z91199 Patient's noncompliance with other medical treatment and regimen due to unspecified reason: Secondary | ICD-10-CM

## 2024-08-21 DIAGNOSIS — R55 Syncope and collapse: Secondary | ICD-10-CM | POA: Diagnosis present

## 2024-08-21 DIAGNOSIS — Z86718 Personal history of other venous thrombosis and embolism: Secondary | ICD-10-CM

## 2024-08-21 DIAGNOSIS — R600 Localized edema: Secondary | ICD-10-CM | POA: Diagnosis present

## 2024-08-21 LAB — CBC WITH DIFFERENTIAL/PLATELET
Abs Immature Granulocytes: 0.03 K/uL (ref 0.00–0.07)
Basophils Absolute: 0 K/uL (ref 0.0–0.1)
Basophils Relative: 0 %
Eosinophils Absolute: 0 K/uL (ref 0.0–0.5)
Eosinophils Relative: 0 %
HCT: 50.3 % (ref 39.0–52.0)
Hemoglobin: 16 g/dL (ref 13.0–17.0)
Immature Granulocytes: 0 %
Lymphocytes Relative: 9 %
Lymphs Abs: 0.6 K/uL — ABNORMAL LOW (ref 0.7–4.0)
MCH: 32 pg (ref 26.0–34.0)
MCHC: 31.8 g/dL (ref 30.0–36.0)
MCV: 100.6 fL — ABNORMAL HIGH (ref 80.0–100.0)
Monocytes Absolute: 0.7 K/uL (ref 0.1–1.0)
Monocytes Relative: 10 %
Neutro Abs: 5.6 K/uL (ref 1.7–7.7)
Neutrophils Relative %: 81 %
Platelets: 395 K/uL (ref 150–400)
RBC: 5 MIL/uL (ref 4.22–5.81)
RDW: 14.9 % (ref 11.5–15.5)
WBC: 7 K/uL (ref 4.0–10.5)
nRBC: 0 % (ref 0.0–0.2)

## 2024-08-21 NOTE — ED Triage Notes (Signed)
 PT coming in tonight to be seen for SOB and leg swelling. Pt states he has been dealing with it for a few days and has called EMS multiple times today but now feels like he has gotten worst.

## 2024-08-21 NOTE — ED Provider Notes (Incomplete)
 [place] EMERGENCY DEPARTMENT AT [facility] Provider Note   CSN: [id number] Arrival date & time: 08/21/24  2248     Patient presents with: Shortness of Breath and Leg Swelling   Richard [name] is a 53 y.o. male.  {Add pertinent medical, surgical, social history, OB history to YEP:67052} The history is provided by the patient and medical records.  Shortness of Breath  53 year old male with history of chronic hypoxic respiratory failure on 6 L at baseline, alcohol abuse, COPD, hypertension, CHF, seizure disorder, presenting to the ED with shortness of breath.  Patient reports he is always short of breath but has been worse today.  Did have a nosebleed earlier today as well.  Has a chronic cough, this is largely unchanged.  He has not had any production of mucus or hemoptysis.  No fever or chills.  Legs are notably swollen today.  States this has been ongoing for few weeks now.  He has not started to weep any fluid.  He states he has been taking his diuretics but did not take dose today.  Prior to Admission medications   Medication Sig Start Date End Date Taking? Authorizing Provider  albuterol  (PROVENTIL ) (2.5 MG/3ML) 0.083% nebulizer solution Take 2.5 mg by nebulization 3 (three) times daily as needed for shortness of breath or wheezing. 05/11/24   [provider]  albuterol  (VENTOLIN  HFA) 108 (90 Base) MCG/ACT inhaler Inhale 2 puffs into the lungs every 6 (six) hours as needed for wheezing or shortness of breath. 09/24/23   Hunsucker, Donnice SAUNDERS, MD  albuterol  (VENTOLIN  HFA) 108 (90 Base) MCG/ACT inhaler Inhale 2 puffs into the lungs every 4 (four) hours as needed for wheezing or shortness of breath. 07/31/24   Cottie Donnice PARAS, MD  amLODipine  (NORVASC ) 10 MG tablet Take 10 mg by mouth every morning. 08/06/23   [provider]  Aspirin -Acetaminophen  (GOODYS BODY PAIN PO) Take 1 packet by mouth daily as needed (for pain).    [provider]  azithromycin   (ZITHROMAX ) 250 MG tablet Take 1 tablet (250 mg total) by mouth daily. Take first 2 tablets together, then 1 every day until finished. 08/07/24   Elnor Jayson LABOR, DO  cloNIDine  (CATAPRES ) 0.1 MG tablet Take 0.1 mg by mouth. Take 2 tablets by mouth at 9am and 5pm and 1 tablet at bedtime    [provider]  Fluticasone -Umeclidin-Vilant (TRELEGY ELLIPTA ) 200-62.5-25 MCG/ACT AEPB Inhale 1 puff into the lungs daily. 09/24/23   Hunsucker, Donnice SAUNDERS, MD  furosemide  (LASIX ) 40 MG tablet Take 1 tablet (40 mg total) by mouth daily. 05/22/24 08/20/24  [place], Camellia PARAS, DO  guaiFENesin  (ROBITUSSIN) 100 MG/5ML liquid Take 5-10 mLs (100-200 mg total) by mouth every 4 (four) hours as needed for cough or to loosen phlegm. 08/07/24   Elnor Jayson LABOR, DO  hydrochlorothiazide  (HYDRODIURIL ) 12.5 MG tablet Take 12.5 mg by mouth every morning. 08/06/23   [provider]  levETIRAcetam  (KEPPRA ) 500 MG tablet Take 1 tablet (500 mg total) by mouth 2 (two) times daily. 01/17/23   Norrine Sharper, MD  losartan  (COZAAR ) 100 MG tablet Take 100 mg by mouth daily. 12/31/22   [provider]  sertraline  (ZOLOFT ) 25 MG tablet Take 25 mg by mouth daily. 07/23/23   [provider]    Allergies: Dilaudid  [hydromorphone  hcl] and Tape    Review of Systems  Respiratory:  Positive for shortness of breath.   All other systems reviewed and are negative.   Updated Vital Signs BP ROLLEN)  163/118 (BP Location: Right Arm)   Pulse (!) 103   Temp 98.4 F (36.9 C) (Oral)   Resp (!) 24   Ht 5' 9 (1.753 m)   Wt 102.1 kg   SpO2 100%   BMI 33.23 kg/m   Physical Exam Vitals and nursing note reviewed.  Constitutional:      Appearance: He is well-developed.  HENT:     Head: Normocephalic and atraumatic.  Eyes:     Conjunctiva/sclera: Conjunctivae normal.     Pupils: Pupils are equal, round, and reactive to light.  Cardiovascular:     Rate and Rhythm: Normal rate and regular rhythm.     Heart sounds: Normal  heart sounds.  Pulmonary:     Effort: Pulmonary effort is normal.     Breath sounds: Normal breath sounds. No wheezing.     Comments: Breath sounds are coarse, dry, hacking cough, 6L O2 in use, able to speak in sentences without apparent difficulty Abdominal:     General: Bowel sounds are normal.     Palpations: Abdomen is soft.  Musculoskeletal:        General: Normal range of motion.     Cervical back: Normal range of motion.     Comments: 2+ pitting edema of BLE, right is slightly worse than left, no weeping or ulcerations noted  Skin:    General: Skin is warm and dry.  Neurological:     Mental Status: He is alert and oriented to person, place, and time.     (all labs ordered are listed, but only abnormal results are displayed) Labs Reviewed  CBC WITH DIFFERENTIAL/PLATELET  BASIC METABOLIC PANEL WITH GFR  BRAIN NATRIURETIC PEPTIDE  TROPONIN I (HIGH SENSITIVITY)    EKG: None  Radiology: No results found.  {Document cardiac monitor, telemetry assessment procedure when appropriate:32947} Procedures   Medications Ordered in the ED - No data to display    {Click here for ABCD2, HEART and other calculators REFRESH Note before signing:1}                              Medical Decision Making Amount and/or Complexity of Data Reviewed Labs: ordered. Radiology: ordered.   53 year old male presenting to the ED with shortness of breath and leg swelling.  He has chronic respiratory failure, on 6 L at baseline.  States he has been feeling worse today.  Leg swelling worsening over the past 3 weeks.  He is afebrile and nontoxic in appearance here.  He does have 2+ pitting edema of the bilateral lower extremities.  Lung sounds are coarse but he is not not in any acute distress at this time.  Has a dry, hacking cough which he reports is chronic.  No fever or chills.  Plan for EKG, labs, chest x-ray.  Patient is very resistant to having blood drawn or other test performed.  I have  stressed the importance of this.  He does have a tendency to leave AMA, has not done this his last 5 ER visits.  Final diagnoses:  None    ED Discharge Orders     None

## 2024-08-21 NOTE — ED Notes (Signed)
 Pt stated the labs that I obtained would be the only ones we would be getting tonight. Dr and RN advised

## 2024-08-22 ENCOUNTER — Other Ambulatory Visit: Payer: Self-pay

## 2024-08-22 ENCOUNTER — Inpatient Hospital Stay (HOSPITAL_COMMUNITY)

## 2024-08-22 ENCOUNTER — Encounter (HOSPITAL_COMMUNITY): Payer: Self-pay

## 2024-08-22 DIAGNOSIS — Z91199 Patient's noncompliance with other medical treatment and regimen due to unspecified reason: Secondary | ICD-10-CM | POA: Diagnosis not present

## 2024-08-22 DIAGNOSIS — Z86718 Personal history of other venous thrombosis and embolism: Secondary | ICD-10-CM

## 2024-08-22 DIAGNOSIS — Z833 Family history of diabetes mellitus: Secondary | ICD-10-CM | POA: Diagnosis not present

## 2024-08-22 DIAGNOSIS — F17210 Nicotine dependence, cigarettes, uncomplicated: Secondary | ICD-10-CM | POA: Diagnosis present

## 2024-08-22 DIAGNOSIS — Z91048 Other nonmedicinal substance allergy status: Secondary | ICD-10-CM | POA: Diagnosis not present

## 2024-08-22 DIAGNOSIS — Z923 Personal history of irradiation: Secondary | ICD-10-CM | POA: Diagnosis not present

## 2024-08-22 DIAGNOSIS — I110 Hypertensive heart disease with heart failure: Secondary | ICD-10-CM | POA: Diagnosis present

## 2024-08-22 DIAGNOSIS — I2489 Other forms of acute ischemic heart disease: Secondary | ICD-10-CM | POA: Diagnosis present

## 2024-08-22 DIAGNOSIS — R600 Localized edema: Secondary | ICD-10-CM | POA: Diagnosis present

## 2024-08-22 DIAGNOSIS — I5033 Acute on chronic diastolic (congestive) heart failure: Secondary | ICD-10-CM | POA: Diagnosis present

## 2024-08-22 DIAGNOSIS — F1490 Cocaine use, unspecified, uncomplicated: Secondary | ICD-10-CM | POA: Diagnosis present

## 2024-08-22 DIAGNOSIS — I5030 Unspecified diastolic (congestive) heart failure: Secondary | ICD-10-CM | POA: Diagnosis present

## 2024-08-22 DIAGNOSIS — F1910 Other psychoactive substance abuse, uncomplicated: Secondary | ICD-10-CM | POA: Diagnosis present

## 2024-08-22 DIAGNOSIS — J441 Chronic obstructive pulmonary disease with (acute) exacerbation: Secondary | ICD-10-CM | POA: Diagnosis present

## 2024-08-22 DIAGNOSIS — R55 Syncope and collapse: Secondary | ICD-10-CM

## 2024-08-22 DIAGNOSIS — R7989 Other specified abnormal findings of blood chemistry: Secondary | ICD-10-CM

## 2024-08-22 DIAGNOSIS — G40909 Epilepsy, unspecified, not intractable, without status epilepticus: Secondary | ICD-10-CM | POA: Diagnosis present

## 2024-08-22 DIAGNOSIS — Z85118 Personal history of other malignant neoplasm of bronchus and lung: Secondary | ICD-10-CM | POA: Diagnosis not present

## 2024-08-22 DIAGNOSIS — Z7951 Long term (current) use of inhaled steroids: Secondary | ICD-10-CM | POA: Diagnosis not present

## 2024-08-22 DIAGNOSIS — Z9221 Personal history of antineoplastic chemotherapy: Secondary | ICD-10-CM | POA: Diagnosis not present

## 2024-08-22 DIAGNOSIS — I160 Hypertensive urgency: Secondary | ICD-10-CM | POA: Diagnosis present

## 2024-08-22 DIAGNOSIS — Z5329 Procedure and treatment not carried out because of patient's decision for other reasons: Secondary | ICD-10-CM | POA: Diagnosis present

## 2024-08-22 DIAGNOSIS — J9621 Acute and chronic respiratory failure with hypoxia: Secondary | ICD-10-CM | POA: Diagnosis present

## 2024-08-22 DIAGNOSIS — Z885 Allergy status to narcotic agent status: Secondary | ICD-10-CM | POA: Diagnosis not present

## 2024-08-22 DIAGNOSIS — M7989 Other specified soft tissue disorders: Secondary | ICD-10-CM

## 2024-08-22 DIAGNOSIS — Z79899 Other long term (current) drug therapy: Secondary | ICD-10-CM | POA: Diagnosis not present

## 2024-08-22 DIAGNOSIS — Z8673 Personal history of transient ischemic attack (TIA), and cerebral infarction without residual deficits: Secondary | ICD-10-CM | POA: Diagnosis not present

## 2024-08-22 LAB — URINALYSIS, ROUTINE W REFLEX MICROSCOPIC
Bacteria, UA: NONE SEEN
Bilirubin Urine: NEGATIVE
Glucose, UA: NEGATIVE mg/dL
Hgb urine dipstick: NEGATIVE
Ketones, ur: NEGATIVE mg/dL
Leukocytes,Ua: NEGATIVE
Nitrite: NEGATIVE
Protein, ur: 100 mg/dL — AB
Specific Gravity, Urine: 1.015 (ref 1.005–1.030)
pH: 5 (ref 5.0–8.0)

## 2024-08-22 LAB — BASIC METABOLIC PANEL WITH GFR
Anion gap: 10 (ref 5–15)
Anion gap: 12 (ref 5–15)
BUN: 11 mg/dL (ref 6–20)
BUN: 8 mg/dL (ref 6–20)
CO2: 24 mmol/L (ref 22–32)
CO2: 26 mmol/L (ref 22–32)
Calcium: 8.6 mg/dL — ABNORMAL LOW (ref 8.9–10.3)
Calcium: 8.9 mg/dL (ref 8.9–10.3)
Chloride: 103 mmol/L (ref 98–111)
Chloride: 109 mmol/L (ref 98–111)
Creatinine, Ser: 0.87 mg/dL (ref 0.61–1.24)
Creatinine, Ser: 0.91 mg/dL (ref 0.61–1.24)
GFR, Estimated: >60 mL/min (ref >60)
GFR, Estimated: >60 mL/min (ref >60)
Glucose, Bld: 108 mg/dL — ABNORMAL HIGH (ref 70–99)
Glucose, Bld: 99 mg/dL (ref 70–99)
Potassium: 4.1 mmol/L (ref 3.5–5.1)
Potassium: 4.5 mmol/L (ref 3.5–5.1)
Sodium: 141 mmol/L (ref 135–145)
Sodium: 143 mmol/L (ref 135–145)

## 2024-08-22 LAB — CBC
HCT: 49.4 % (ref 39.0–52.0)
Hemoglobin: 16 g/dL (ref 13.0–17.0)
MCH: 32.2 pg (ref 26.0–34.0)
MCHC: 32.4 g/dL (ref 30.0–36.0)
MCV: 99.4 fL (ref 80.0–100.0)
Platelets: 400 K/uL (ref 150–400)
RBC: 4.97 MIL/uL (ref 4.22–5.81)
RDW: 14.8 % (ref 11.5–15.5)
WBC: 6.2 K/uL (ref 4.0–10.5)
nRBC: 0 % (ref 0.0–0.2)

## 2024-08-22 LAB — MAGNESIUM: Magnesium: 1.8 mg/dL (ref 1.7–2.4)

## 2024-08-22 LAB — PROCALCITONIN: Procalcitonin: <0.1 ng/mL

## 2024-08-22 LAB — BRAIN NATRIURETIC PEPTIDE: B Natriuretic Peptide: 484.4 pg/mL — ABNORMAL HIGH (ref 0.0–100.0)

## 2024-08-22 LAB — TROPONIN I (HIGH SENSITIVITY)
Troponin I (High Sensitivity): 22 ng/L — ABNORMAL HIGH (ref <18)
Troponin I (High Sensitivity): 23 ng/L — ABNORMAL HIGH (ref <18)

## 2024-08-22 MED ORDER — IPRATROPIUM-ALBUTEROL 0.5-2.5 (3) MG/3ML IN SOLN
3.0000 mL | Freq: Once | RESPIRATORY_TRACT | Status: AC
  Administered 2024-08-22: 3 mL via RESPIRATORY_TRACT
  Filled 2024-08-22: qty 3

## 2024-08-22 MED ORDER — METHYLPREDNISOLONE SODIUM SUCC 125 MG IJ SOLR
125.0000 mg | Freq: Once | INTRAMUSCULAR | Status: AC
  Administered 2024-08-22: 125 mg via INTRAVENOUS
  Filled 2024-08-22: qty 2

## 2024-08-22 MED ORDER — ARFORMOTEROL TARTRATE 15 MCG/2ML IN NEBU
15.0000 ug | INHALATION_SOLUTION | Freq: Two times a day (BID) | RESPIRATORY_TRACT | Status: DC
  Administered 2024-08-22: 15 ug via RESPIRATORY_TRACT
  Filled 2024-08-22: qty 2

## 2024-08-22 MED ORDER — CLONIDINE HCL 0.1 MG PO TABS
0.1000 mg | ORAL_TABLET | ORAL | Status: DC
Start: 2024-08-22 — End: 2024-08-22

## 2024-08-22 MED ORDER — FUROSEMIDE 20 MG PO TABS: 40.0000 mg | ORAL_TABLET | Freq: Two times a day (BID) | ORAL | Status: DC

## 2024-08-22 MED ORDER — ALBUTEROL SULFATE (2.5 MG/3ML) 0.083% IN NEBU: 2.5000 mg | INHALATION_SOLUTION | RESPIRATORY_TRACT | Status: DC | PRN

## 2024-08-22 MED ORDER — FUROSEMIDE 20 MG PO TABS
40.0000 mg | ORAL_TABLET | Freq: Once | ORAL | Status: AC
  Administered 2024-08-22: 40 mg via ORAL
  Filled 2024-08-22: qty 2

## 2024-08-22 MED ORDER — TRIMETHOBENZAMIDE HCL 100 MG/ML IM SOLN: 200.0000 mg | Freq: Four times a day (QID) | INTRAMUSCULAR | Status: DC | PRN

## 2024-08-22 MED ORDER — CLONIDINE HCL 0.2 MG PO TABS
0.2000 mg | ORAL_TABLET | Freq: Two times a day (BID) | ORAL | Status: DC
  Administered 2024-08-22: 0.2 mg via ORAL
  Filled 2024-08-22: qty 1

## 2024-08-22 MED ORDER — SERTRALINE HCL 25 MG PO TABS
25.0000 mg | ORAL_TABLET | Freq: Every day | ORAL | Status: DC
  Administered 2024-08-22: 25 mg via ORAL
  Filled 2024-08-22: qty 1

## 2024-08-22 MED ORDER — GUAIFENESIN ER 600 MG PO TB12
600.0000 mg | ORAL_TABLET | Freq: Two times a day (BID) | ORAL | Status: DC
  Administered 2024-08-22: 600 mg via ORAL
  Filled 2024-08-22: qty 1

## 2024-08-22 MED ORDER — ENOXAPARIN SODIUM 40 MG/0.4ML IJ SOSY
40.0000 mg | PREFILLED_SYRINGE | Freq: Every day | INTRAMUSCULAR | Status: DC
  Filled 2024-08-22 (×2): qty 0.4

## 2024-08-22 MED ORDER — CLONIDINE HCL 0.1 MG PO TABS: 0.1000 mg | ORAL_TABLET | Freq: Every day | ORAL | Status: DC

## 2024-08-22 MED ORDER — LOSARTAN POTASSIUM 50 MG PO TABS
100.0000 mg | ORAL_TABLET | Freq: Every day | ORAL | Status: DC
  Administered 2024-08-22: 100 mg via ORAL
  Filled 2024-08-22: qty 2

## 2024-08-22 MED ORDER — HYDRALAZINE HCL 20 MG/ML IJ SOLN: 10.0000 mg | Freq: Once | INTRAMUSCULAR | Status: DC

## 2024-08-22 MED ORDER — BUDESONIDE 0.5 MG/2ML IN SUSP
0.5000 mg | Freq: Two times a day (BID) | RESPIRATORY_TRACT | Status: DC
Start: 2024-08-22 — End: 2024-08-22
  Administered 2024-08-22: 0.5 mg via RESPIRATORY_TRACT
  Filled 2024-08-22: qty 2

## 2024-08-22 MED ORDER — IPRATROPIUM-ALBUTEROL 0.5-2.5 (3) MG/3ML IN SOLN
3.0000 mL | Freq: Four times a day (QID) | RESPIRATORY_TRACT | Status: DC
  Administered 2024-08-22 (×3): 3 mL via RESPIRATORY_TRACT
  Filled 2024-08-22 (×3): qty 3

## 2024-08-22 MED ORDER — OXYCODONE HCL 5 MG PO TABS
5.0000 mg | ORAL_TABLET | ORAL | Status: DC | PRN
  Administered 2024-08-22: 5 mg via ORAL
  Filled 2024-08-22: qty 1

## 2024-08-22 MED ORDER — METHOCARBAMOL 500 MG PO TABS: 500.0000 mg | ORAL_TABLET | Freq: Once | ORAL | Status: DC

## 2024-08-22 MED ORDER — FUROSEMIDE 40 MG PO TABS
40.0000 mg | ORAL_TABLET | Freq: Two times a day (BID) | ORAL | Status: DC
Start: 2024-08-22 — End: 2024-08-22
  Administered 2024-08-22: 40 mg via ORAL
  Filled 2024-08-22: qty 2

## 2024-08-22 MED ORDER — LEVETIRACETAM 500 MG PO TABS
500.0000 mg | ORAL_TABLET | Freq: Two times a day (BID) | ORAL | Status: DC
  Administered 2024-08-22: 500 mg via ORAL
  Filled 2024-08-22: qty 1

## 2024-08-22 MED ORDER — HYDRALAZINE HCL 20 MG/ML IJ SOLN
10.0000 mg | INTRAMUSCULAR | Status: DC | PRN
  Administered 2024-08-22: 10 mg via INTRAVENOUS
  Filled 2024-08-22: qty 1

## 2024-08-22 MED ORDER — ACETAMINOPHEN 650 MG RE SUPP: 650.0000 mg | Freq: Four times a day (QID) | RECTAL | Status: DC | PRN

## 2024-08-22 MED ORDER — NICOTINE 21 MG/24HR TD PT24
21.0000 mg | MEDICATED_PATCH | Freq: Every day | TRANSDERMAL | Status: DC
Start: 2024-08-22 — End: 2024-08-22
  Administered 2024-08-22: 21 mg via TRANSDERMAL
  Filled 2024-08-22: qty 1

## 2024-08-22 MED ORDER — ACETAMINOPHEN 325 MG PO TABS: 650.0000 mg | ORAL_TABLET | Freq: Four times a day (QID) | ORAL | Status: DC | PRN

## 2024-08-22 NOTE — ED Notes (Signed)
 Per MD Claudene pt able to go AMA. RN requested pt to be seen by provider prior to leaving AMA. Pt had oxy 1214 and plans to drive himself to [facility]. MD made aware. Charge RN aware. Pt made aware of risks involved and signed AMA without any additional questions.

## 2024-08-22 NOTE — ED Notes (Signed)
 CCMD called.

## 2024-08-22 NOTE — ED Notes (Signed)
 Patient called out stating that he could not breathe, O2 in 80s on 8 lpm HFNC. Placed onto 15 lpm HFNC with minimal improvement. Pt previously on bipap and agreed to go back on. Admitting provider notified.

## 2024-08-22 NOTE — ED Notes (Signed)
 Patient assisted back to bed by RN Chiquita and placed back on monitor as well as supplemental oxygen . Patient agreeing to stay to wait for transfer to [facility]. MD Claudene notified.
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
## 2024-08-22 NOTE — Progress Notes (Signed)
 RT went to place pt back on BiPAP but unfortunately patient was eating breakfast. RT told pt I'd be back to check on him about 10:30 and if O2 was still low and pt still working to breathe we would have to place him back on BiPAP.

## 2024-08-22 NOTE — ED Notes (Addendum)
 Patient sitting on side of bed, complaining that he hasn't eaten and that no one has talked to him. It is apparent he has eaten as scraps of a [place] sandwich are on bedside table, as well as graham cracker wrappers. Patient stated he was ready to go and requesting DC paperwork. I advised him that he is up for admission, and I would have provider come speak with him. Patient had removed all monitoring equipment, and was resistant to being hooked back up.

## 2024-08-22 NOTE — Progress Notes (Signed)
 RT NOTE:  Pt taken off BIPAP and placed on NRB. Pt has repeatedly taken off BIPAP all night. He complains he doesn't need the mask and argues that we aren't explaining anything to him. He keep requesting food and to go home. This RRT has explained the BIPAP therapy multiple times tonight. Upon arrival this morning, patient was standing beside bed with mask loosen around face causing the BIPAP to alarm and not deliver therapy. Pt is tolerating NRB but asking to go home.
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
## 2024-08-22 NOTE — ED Notes (Signed)
Vasc US at bedside.  

## 2024-08-22 NOTE — Progress Notes (Signed)
 Pt leaving AMA at 1730 this shift. Pt educated on why he is here, including his increased O2 requirement and need to possibly be diuresed. Pt voices that he is not sick and that there is no reason for him to still be here and insists on arranging a ride to leave. VS obtained but pt refuses any other form of assessment. MD notified but not currently on department. Pt arranged transport with friend to go home and peripheral IV removed prior to pt departure from ED entrance.

## 2024-08-22 NOTE — ED Notes (Signed)
 Patient with [facility] to [facility] for inpatient tx. Paperwork handed to [employer]. Report called to [facility] [employer].

## 2024-08-22 NOTE — ED Notes (Signed)
 Secretary messaged to call [facility]

## 2024-08-22 NOTE — ED Notes (Signed)
 Patient continually getting out of bed and removing leads. Patient instructed to get back in bed. Patient following commands at this time.

## 2024-08-23 LAB — URINE DRUGS OF ABUSE SCREEN W ALC, ROUTINE (REF LAB)
Amphetamines, Urine: NEGATIVE ng/mL
Barbiturate, Ur: NEGATIVE ng/mL
Benzodiazepine Quant, Ur: NEGATIVE ng/mL
Cannabinoid Quant, Ur: NEGATIVE ng/mL
Cocaine (Metab.): NEGATIVE ng/mL
Creatinine, Urine: 77.6 mg/dL (ref 20.0–300.0)
Ethanol U, Quan: NEGATIVE %
Methadone Screen, Urine: NEGATIVE ng/mL
Nitrite Urine, Quantitative: NEGATIVE ug/mL
OPIATE SCREEN URINE: NEGATIVE ng/mL
Phencyclidine, Ur: NEGATIVE ng/mL
Propoxyphene, Urine: NEGATIVE ng/mL
pH, Urine: 4.9 (ref 4.5–8.9)

## 2024-09-04 ENCOUNTER — Ambulatory Visit (HOSPITAL_COMMUNITY)

## 2024-09-04 ENCOUNTER — Inpatient Hospital Stay

## 2024-09-08 ENCOUNTER — Other Ambulatory Visit

## 2024-09-08 ENCOUNTER — Ambulatory Visit (HOSPITAL_COMMUNITY)

## 2024-09-11 ENCOUNTER — Inpatient Hospital Stay (HOSPITAL_COMMUNITY)
Admission: EM | Admit: 2024-09-11 | Discharge: 2024-09-12 | DRG: 189 | Discharge status: Against Medical Advice | Attending: Internal Medicine | Admitting: Internal Medicine

## 2024-09-11 ENCOUNTER — Other Ambulatory Visit: Payer: Self-pay

## 2024-09-11 ENCOUNTER — Emergency Department (HOSPITAL_COMMUNITY)

## 2024-09-11 ENCOUNTER — Inpatient Hospital Stay: Admitting: Internal Medicine

## 2024-09-11 DIAGNOSIS — F39 Unspecified mood [affective] disorder: Secondary | ICD-10-CM | POA: Diagnosis present

## 2024-09-11 DIAGNOSIS — Z91048 Other nonmedicinal substance allergy status: Secondary | ICD-10-CM

## 2024-09-11 DIAGNOSIS — C3480 Malignant neoplasm of overlapping sites of unspecified bronchus and lung: Secondary | ICD-10-CM | POA: Diagnosis present

## 2024-09-11 DIAGNOSIS — Z7982 Long term (current) use of aspirin: Secondary | ICD-10-CM | POA: Diagnosis not present

## 2024-09-11 DIAGNOSIS — I5033 Acute on chronic diastolic (congestive) heart failure: Secondary | ICD-10-CM | POA: Diagnosis present

## 2024-09-11 DIAGNOSIS — Z7951 Long term (current) use of inhaled steroids: Secondary | ICD-10-CM

## 2024-09-11 DIAGNOSIS — Z9981 Dependence on supplemental oxygen: Secondary | ICD-10-CM | POA: Diagnosis not present

## 2024-09-11 DIAGNOSIS — I3139 Other pericardial effusion (noninflammatory): Secondary | ICD-10-CM | POA: Diagnosis present

## 2024-09-11 DIAGNOSIS — Z8673 Personal history of transient ischemic attack (TIA), and cerebral infarction without residual deficits: Secondary | ICD-10-CM | POA: Diagnosis not present

## 2024-09-11 DIAGNOSIS — Z5329 Procedure and treatment not carried out because of patient's decision for other reasons: Secondary | ICD-10-CM | POA: Diagnosis not present

## 2024-09-11 DIAGNOSIS — K219 Gastro-esophageal reflux disease without esophagitis: Secondary | ICD-10-CM | POA: Diagnosis present

## 2024-09-11 DIAGNOSIS — F17210 Nicotine dependence, cigarettes, uncomplicated: Secondary | ICD-10-CM | POA: Diagnosis present

## 2024-09-11 DIAGNOSIS — G40909 Epilepsy, unspecified, not intractable, without status epilepticus: Secondary | ICD-10-CM | POA: Diagnosis present

## 2024-09-11 DIAGNOSIS — I7121 Aneurysm of the ascending aorta, without rupture: Secondary | ICD-10-CM | POA: Diagnosis present

## 2024-09-11 DIAGNOSIS — Z79899 Other long term (current) drug therapy: Secondary | ICD-10-CM | POA: Diagnosis not present

## 2024-09-11 DIAGNOSIS — J441 Chronic obstructive pulmonary disease with (acute) exacerbation: Principal | ICD-10-CM | POA: Diagnosis present

## 2024-09-11 DIAGNOSIS — Z888 Allergy status to other drugs, medicaments and biological substances status: Secondary | ICD-10-CM | POA: Diagnosis not present

## 2024-09-11 DIAGNOSIS — J9811 Atelectasis: Secondary | ICD-10-CM | POA: Diagnosis present

## 2024-09-11 DIAGNOSIS — J9621 Acute and chronic respiratory failure with hypoxia: Secondary | ICD-10-CM | POA: Diagnosis present

## 2024-09-11 DIAGNOSIS — I110 Hypertensive heart disease with heart failure: Secondary | ICD-10-CM | POA: Diagnosis present

## 2024-09-11 DIAGNOSIS — Z91199 Patient's noncompliance with other medical treatment and regimen due to unspecified reason: Secondary | ICD-10-CM | POA: Diagnosis not present

## 2024-09-11 DIAGNOSIS — J90 Pleural effusion, not elsewhere classified: Secondary | ICD-10-CM | POA: Diagnosis present

## 2024-09-11 DIAGNOSIS — Z86718 Personal history of other venous thrombosis and embolism: Secondary | ICD-10-CM | POA: Diagnosis not present

## 2024-09-11 DIAGNOSIS — Z833 Family history of diabetes mellitus: Secondary | ICD-10-CM | POA: Diagnosis not present

## 2024-09-11 DIAGNOSIS — D3502 Benign neoplasm of left adrenal gland: Secondary | ICD-10-CM | POA: Diagnosis present

## 2024-09-11 DIAGNOSIS — I10 Essential (primary) hypertension: Secondary | ICD-10-CM | POA: Diagnosis present

## 2024-09-11 DIAGNOSIS — R0902 Hypoxemia: Secondary | ICD-10-CM

## 2024-09-11 DIAGNOSIS — I509 Heart failure, unspecified: Secondary | ICD-10-CM

## 2024-09-11 LAB — CBC WITH DIFFERENTIAL/PLATELET
Abs Immature Granulocytes: 0.03 K/uL (ref 0.00–0.07)
Basophils Absolute: 0 K/uL (ref 0.0–0.1)
Basophils Relative: 0 %
Eosinophils Absolute: 0 K/uL (ref 0.0–0.5)
Eosinophils Relative: 0 %
HCT: 49 % (ref 39.0–52.0)
Hemoglobin: 16.1 g/dL (ref 13.0–17.0)
Immature Granulocytes: 0 %
Lymphocytes Relative: 7 %
Lymphs Abs: 0.5 K/uL — ABNORMAL LOW (ref 0.7–4.0)
MCH: 32.1 pg (ref 26.0–34.0)
MCHC: 32.9 g/dL (ref 30.0–36.0)
MCV: 97.8 fL (ref 80.0–100.0)
Monocytes Absolute: 0.4 K/uL (ref 0.1–1.0)
Monocytes Relative: 6 %
Neutro Abs: 6.4 K/uL (ref 1.7–7.7)
Neutrophils Relative %: 87 %
Platelets: 242 K/uL (ref 150–400)
RBC: 5.01 MIL/uL (ref 4.22–5.81)
RDW: 15.2 % (ref 11.5–15.5)
WBC: 7.4 K/uL (ref 4.0–10.5)
nRBC: 0 % (ref 0.0–0.2)

## 2024-09-11 LAB — I-STAT VENOUS BLOOD GAS, ED
Acid-Base Excess: 2 mmol/L (ref 0.0–2.0)
Bicarbonate: 27.2 mmol/L (ref 20.0–28.0)
Calcium, Ion: 1.1 mmol/L — ABNORMAL LOW (ref 1.15–1.40)
HCT: 53 % — ABNORMAL HIGH (ref 39.0–52.0)
Hemoglobin: 18 g/dL — ABNORMAL HIGH (ref 13.0–17.0)
O2 Saturation: 76 %
Potassium: 4.8 mmol/L (ref 3.5–5.1)
Sodium: 140 mmol/L (ref 135–145)
TCO2: 29 mmol/L (ref 22–32)
pCO2, Ven: 44.3 mmHg (ref 44–60)
pH, Ven: 7.397 (ref 7.25–7.43)
pO2, Ven: 42 mmHg (ref 32–45)

## 2024-09-11 LAB — BASIC METABOLIC PANEL WITH GFR
Anion gap: 10 (ref 5–15)
BUN: 9 mg/dL (ref 6–20)
CO2: 22 mmol/L (ref 22–32)
Calcium: 8.7 mg/dL — ABNORMAL LOW (ref 8.9–10.3)
Chloride: 107 mmol/L (ref 98–111)
Creatinine, Ser: 0.81 mg/dL (ref 0.61–1.24)
GFR, Estimated: >60 mL/min (ref >60)
Glucose, Bld: 93 mg/dL (ref 70–99)
Potassium: 4.2 mmol/L (ref 3.5–5.1)
Sodium: 139 mmol/L (ref 135–145)

## 2024-09-11 LAB — RESP PANEL BY RT-PCR (RSV, FLU A&B, COVID)  RVPGX2
Influenza A by PCR: NEGATIVE
Influenza B by PCR: NEGATIVE
Resp Syncytial Virus by PCR: NEGATIVE
SARS Coronavirus 2 by RT PCR: NEGATIVE

## 2024-09-11 LAB — BRAIN NATRIURETIC PEPTIDE: B Natriuretic Peptide: 326.5 pg/mL — ABNORMAL HIGH (ref 0.0–100.0)

## 2024-09-11 LAB — TROPONIN I (HIGH SENSITIVITY)
Troponin I (High Sensitivity): 15 ng/L (ref <18)
Troponin I (High Sensitivity): 16 ng/L (ref <18)

## 2024-09-11 MED ORDER — SENNOSIDES-DOCUSATE SODIUM 8.6-50 MG PO TABS: 1.0000 | ORAL_TABLET | Freq: Every evening | ORAL | Status: DC | PRN

## 2024-09-11 MED ORDER — CLONIDINE HCL 0.1 MG PO TABS
0.1000 mg | ORAL_TABLET | Freq: Every day | ORAL | Status: DC
Start: 2024-09-11 — End: 2024-09-12
  Administered 2024-09-11: 0.1 mg via ORAL
  Filled 2024-09-11: qty 1

## 2024-09-11 MED ORDER — IPRATROPIUM-ALBUTEROL 0.5-2.5 (3) MG/3ML IN SOLN
3.0000 mL | Freq: Four times a day (QID) | RESPIRATORY_TRACT | Status: DC | PRN
  Filled 2024-09-11: qty 3

## 2024-09-11 MED ORDER — ONDANSETRON HCL 4 MG/2ML IJ SOLN: 4.0000 mg | Freq: Four times a day (QID) | INTRAMUSCULAR | Status: DC | PRN

## 2024-09-11 MED ORDER — ACETAMINOPHEN 325 MG PO TABS: 650.0000 mg | ORAL_TABLET | Freq: Four times a day (QID) | ORAL | Status: DC | PRN

## 2024-09-11 MED ORDER — LABETALOL HCL 5 MG/ML IV SOLN
10.0000 mg | Freq: Once | INTRAVENOUS | Status: AC
  Administered 2024-09-11: 10 mg via INTRAVENOUS
  Filled 2024-09-11: qty 4

## 2024-09-11 MED ORDER — BISACODYL 5 MG PO TBEC: 5.0000 mg | DELAYED_RELEASE_TABLET | Freq: Every day | ORAL | Status: DC | PRN

## 2024-09-11 MED ORDER — LEVETIRACETAM 500 MG PO TABS
500.0000 mg | ORAL_TABLET | Freq: Two times a day (BID) | ORAL | Status: DC
  Administered 2024-09-11: 500 mg via ORAL
  Filled 2024-09-11: qty 1

## 2024-09-11 MED ORDER — ALBUTEROL SULFATE HFA 108 (90 BASE) MCG/ACT IN AERS
1.0000 | INHALATION_SPRAY | Freq: Once | RESPIRATORY_TRACT | Status: AC
  Administered 2024-09-11: 1 via RESPIRATORY_TRACT
  Filled 2024-09-11: qty 6.7

## 2024-09-11 MED ORDER — MAGNESIUM SULFATE 2 GM/50ML IV SOLN
2.0000 g | Freq: Once | INTRAVENOUS | Status: AC
  Administered 2024-09-11: 2 g via INTRAVENOUS
  Filled 2024-09-11: qty 50

## 2024-09-11 MED ORDER — FUROSEMIDE 10 MG/ML IJ SOLN
60.0000 mg | Freq: Once | INTRAMUSCULAR | Status: AC
  Administered 2024-09-11: 60 mg via INTRAVENOUS
  Filled 2024-09-11: qty 6

## 2024-09-11 MED ORDER — CLONIDINE HCL 0.2 MG PO TABS: 0.2000 mg | ORAL_TABLET | Freq: Two times a day (BID) | ORAL | Status: DC

## 2024-09-11 MED ORDER — ALBUTEROL SULFATE (2.5 MG/3ML) 0.083% IN NEBU
10.0000 mg/h | INHALATION_SOLUTION | RESPIRATORY_TRACT | Status: DC
  Administered 2024-09-11: 10 mg/h via RESPIRATORY_TRACT
  Filled 2024-09-11: qty 3
  Filled 2024-09-11: qty 12

## 2024-09-11 MED ORDER — BUDESONIDE 0.25 MG/2ML IN SUSP
0.2500 mg | Freq: Two times a day (BID) | RESPIRATORY_TRACT | Status: DC
  Administered 2024-09-11: 0.25 mg via RESPIRATORY_TRACT
  Filled 2024-09-11: qty 2

## 2024-09-11 MED ORDER — METOCLOPRAMIDE HCL 5 MG/ML IJ SOLN: 10.0000 mg | Freq: Once | INTRAMUSCULAR | Status: DC

## 2024-09-11 MED ORDER — ACETAMINOPHEN 500 MG PO TABS
1000.0000 mg | ORAL_TABLET | ORAL | Status: AC
  Administered 2024-09-11: 1000 mg via ORAL
  Filled 2024-09-11: qty 2

## 2024-09-11 MED ORDER — ARFORMOTEROL TARTRATE 15 MCG/2ML IN NEBU
15.0000 ug | INHALATION_SOLUTION | Freq: Two times a day (BID) | RESPIRATORY_TRACT | Status: DC
  Administered 2024-09-11: 15 ug via RESPIRATORY_TRACT
  Filled 2024-09-11: qty 2

## 2024-09-11 MED ORDER — METHYLPREDNISOLONE SODIUM SUCC 125 MG IJ SOLR
80.0000 mg | INTRAMUSCULAR | Status: DC
Start: 2024-09-11 — End: 2024-09-12
  Administered 2024-09-11: 80 mg via INTRAVENOUS
  Filled 2024-09-11: qty 2

## 2024-09-11 MED ORDER — ACETAMINOPHEN 650 MG RE SUPP: 650.0000 mg | Freq: Four times a day (QID) | RECTAL | Status: DC | PRN

## 2024-09-11 MED ORDER — DIPHENHYDRAMINE HCL 25 MG PO CAPS: 25.0000 mg | ORAL_CAPSULE | Freq: Once | ORAL | Status: DC

## 2024-09-11 MED ORDER — LOSARTAN POTASSIUM 50 MG PO TABS: 100.0000 mg | ORAL_TABLET | Freq: Every day | ORAL | Status: DC

## 2024-09-11 MED ORDER — ALBUTEROL SULFATE (2.5 MG/3ML) 0.083% IN NEBU
5.0000 mg | INHALATION_SOLUTION | Freq: Once | RESPIRATORY_TRACT | Status: AC
  Administered 2024-09-11: 5 mg via RESPIRATORY_TRACT
  Filled 2024-09-11: qty 6

## 2024-09-11 MED ORDER — ENOXAPARIN SODIUM 40 MG/0.4ML IJ SOSY: 40.0000 mg | PREFILLED_SYRINGE | INTRAMUSCULAR | Status: DC

## 2024-09-11 MED ORDER — ASPIRIN 81 MG PO CHEW: 81.0000 mg | CHEWABLE_TABLET | Freq: Every day | ORAL | Status: DC

## 2024-09-11 MED ORDER — ONDANSETRON HCL 4 MG PO TABS: 4.0000 mg | ORAL_TABLET | Freq: Four times a day (QID) | ORAL | Status: DC | PRN

## 2024-09-11 MED ORDER — METOCLOPRAMIDE HCL 10 MG PO TABS
10.0000 mg | ORAL_TABLET | Freq: Once | ORAL | Status: AC
  Administered 2024-09-11: 10 mg via ORAL
  Filled 2024-09-11: qty 1

## 2024-09-11 MED ORDER — FUROSEMIDE 10 MG/ML IJ SOLN: 40.0000 mg | Freq: Two times a day (BID) | INTRAMUSCULAR | Status: DC

## 2024-09-11 MED ORDER — SERTRALINE HCL 50 MG PO TABS: 25.0000 mg | ORAL_TABLET | Freq: Every day | ORAL | Status: DC

## 2024-09-11 MED ORDER — IOHEXOL 350 MG/ML SOLN
75.0000 mL | Freq: Once | INTRAVENOUS | Status: AC | PRN
  Administered 2024-09-11: 75 mL via INTRAVENOUS

## 2024-09-11 MED ORDER — SODIUM CHLORIDE 0.9% FLUSH
3.0000 mL | Freq: Two times a day (BID) | INTRAVENOUS | Status: DC
  Administered 2024-09-11: 3 mL via INTRAVENOUS

## 2024-09-11 NOTE — ED Notes (Signed)
 Pt is complaining of the temp on the nasal cannula. RT has adjusted Hi Flow temp to as low as it will go. Pt threatening to sign out because he cannot be uncomfortable.
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
## 2024-09-11 NOTE — ED Provider Triage Note (Signed)
 Emergency Medicine Provider Triage Evaluation Note  Richard [name] , a 53 y.o. male  was evaluated in triage.  Pt complains of difficulty breathing.  Patient has a history of COPD and CHF.  Had trouble breathing for some time but feeling its gotten worse over the past few days.  No fevers or chills.  No chest pain or pressure.  Review of Systems  Positive: sob Negative: cp  Physical Exam  BP (!) 175/114   Pulse (!) 110   Temp 98.7 F (37.1 C) (Oral)   Resp 20   Wt 102 kg   SpO2 (!) 88%   BMI 33.21 kg/m  Gen:   Awake, no distress   Resp:  Normal effort  MSK:   Moves extremities without difficulty  Other:    Medical Decision Making  Medically screening exam initiated at 1:48 PM.  Appropriate orders placed.  Graeme Gut was informed that the remainder of the evaluation will be completed by another provider, this initial triage assessment does not replace that evaluation, and the importance of remaining in the ED until their evaluation is complete.     Emil Share, DO 09/11/24 1349

## 2024-09-11 NOTE — ED Notes (Signed)
 Pt is not improving. After the breathing treatment, pt bp did improve along with his breathing. After a few minutes, pt began to experience SOB again. RT asked if it was ok to place pt on bipap

## 2024-09-11 NOTE — Progress Notes (Signed)
 Pt's sats/breathing did not improve after the breathing treatment, neither did the pts BP. Pt was placed on HHFNC 50%-50L. Sats high 90's. RT will continue to monitor.

## 2024-09-11 NOTE — ED Notes (Signed)
 RT in to place pt on Hi flow [place].

## 2024-09-11 NOTE — ED Notes (Signed)
 Pt o2 sat is at 85-88%. Pt asked if I could increase his o2 because he is struggling to breathe. Increased o2 to 9L. Pt went up to 88% [place], but still stated he is having a hard time. Wy[place],PA made aware.

## 2024-09-11 NOTE — ED Provider Notes (Signed)
 [place] EMERGENCY DEPARTMENT AT [facility] Provider Note   CSN: [id number] Arrival date & time: 09/11/24  1308     Patient presents with: Shortness of Breath   Richard [name] is a 53 y.o. male.    Shortness of Breath  Patient is a 53 year old male with a past medical history significant for lung cancer on palliative radiation, chemo, immunotherapy stopped due to nonadherence with treatment, history of DVT, tobacco abuse, polysubstance abuse, COPD, CHF on 4 to 5 L of oxygen  chronically by nasal cannula  He presents emergency room today he states he has felt dyspneic since he left the hospital seems to have been worse over the past couple days.  He left AGAINST MEDICAL ADVICE after 18 hours of admission 9/8.  Received Solu-Medrol  IV and 2 DuoNebs by EMS.  He states he is feels that he is breathing slightly better.  Denies any chest pain but states he feels some chest tightness no nausea or vomiting.       Prior to Admission medications   Medication Sig Start Date End Date Taking? Authorizing Provider  albuterol  (PROVENTIL ) (2.5 MG/3ML) 0.083% nebulizer solution Take 2.5 mg by nebulization 3 (three) times daily as needed for shortness of breath or wheezing. 05/11/24  Yes [provider]  albuterol  (VENTOLIN  HFA) 108 (90 Base) MCG/ACT inhaler Inhale 2 puffs into the lungs every 4 (four) hours as needed for wheezing or shortness of breath. 07/31/24  Yes Cottie Donnice PARAS, MD  aspirin  81 MG chewable tablet Chew 81 mg by mouth daily.   Yes [provider]  cloNIDine  (CATAPRES ) 0.1 MG tablet Take 0.1-0.2 mg by mouth See admin instructions. Take 2 tablets (0.2mg ) by mouth in the morning and evening (0900 and 1700) and take 1 tablet (0.1mg ) at bedtime.   Yes [provider]  furosemide  (LASIX ) 40 MG tablet Take 1 tablet (40 mg total) by mouth daily. Patient taking differently: Take 40 mg by mouth 2 (two) times daily. 05/22/24 10/21/24 Yes [place], Eric J, DO   levETIRAcetam  (KEPPRA ) 500 MG tablet Take 1 tablet (500 mg total) by mouth 2 (two) times daily. 01/17/23  Yes Norrine Sharper, MD  losartan  (COZAAR ) 100 MG tablet Take 100 mg by mouth daily. 12/31/22  Yes [provider]  sertraline  (ZOLOFT ) 25 MG tablet Take 25 mg by mouth daily. 07/23/23  Yes [provider]  SYMBICORT  160-4.5 MCG/ACT inhaler Inhale 2 puffs into the lungs in the morning and at bedtime. 08/25/24  Yes [provider]  TRELEGY ELLIPTA  100-62.5-25 MCG/ACT AEPB Inhale 1 puff into the lungs daily. Patient not taking: Reported on 09/11/2024 07/05/24   [provider]    Allergies: Dilaudid  [hydromorphone  hcl] and Tape    Review of Systems  Respiratory:  Positive for shortness of breath.     Updated Vital Signs BP (!) 144/113   Pulse 93   Temp 98.6 F (37 C)   Resp (!) 22   Wt 102 kg   SpO2 96%   BMI 33.21 kg/m   Physical Exam Vitals and nursing note reviewed.  Constitutional:      General: He is not in acute distress.    Comments: Chronically unhealthy looking but in no acute distress, speaking in full sentences.  HENT:     Head: Normocephalic and atraumatic.     Nose: Nose normal.  Eyes:     General: No scleral icterus. Cardiovascular:     Rate and Rhythm: Regular rhythm. Tachycardia present.  Pulses: Normal pulses.     Heart sounds: Normal heart sounds.  Pulmonary:     Breath sounds: Wheezing present.     Comments: Tachypnea, faint end expiratory wheeze, good air movement Abdominal:     Palpations: Abdomen is soft.     Tenderness: There is no abdominal tenderness.  Musculoskeletal:     Cervical back: Normal range of motion.     Right lower leg: No edema.     Left lower leg: No edema.  Skin:    General: Skin is warm and dry.     Capillary Refill: Capillary refill takes less than 2 seconds.  Neurological:     Mental Status: He is alert. Mental status is at baseline.  Psychiatric:        Mood and Affect: Mood normal.         Behavior: Behavior normal.     (all labs ordered are listed, but only abnormal results are displayed) Labs Reviewed  CBC WITH DIFFERENTIAL/PLATELET - Abnormal; Notable for the following components:      Result Value   Lymphs Abs 0.5 (*)    All other components within normal limits  BASIC METABOLIC PANEL WITH GFR - Abnormal; Notable for the following components:   Calcium 8.7 (*)    All other components within normal limits  BRAIN NATRIURETIC PEPTIDE - Abnormal; Notable for the following components:   B Natriuretic Peptide 326.5 (*)    All other components within normal limits  I-STAT VENOUS BLOOD GAS, ED - Abnormal; Notable for the following components:   Calcium, Ion 1.10 (*)    HCT 53.0 (*)    Hemoglobin 18.0 (*)    All other components within normal limits  RESP PANEL BY RT-PCR (RSV, FLU A&B, COVID)  RVPGX2  TROPONIN I (HIGH SENSITIVITY)  TROPONIN I (HIGH SENSITIVITY)    EKG: EKG Interpretation Date/Time:  Monday September 11 2024 15:02:43 EDT Ventricular Rate:  109 PR Interval:  172 QRS Duration:  84 QT Interval:  358 QTC Calculation: 482 R Axis:   46  Text Interpretation: Sinus tachycardia Possible Left atrial enlargement Septal infarct , age undetermined Abnormal ECG When compared with ECG of 21-Aug-2024 22:52, PREVIOUS ECG IS PRESENT when compared to prior, les artifact and wandetring baseline No sTEMI Confirmed by Ginger Barefoot ([id number]) on 09/11/2024 3:23:19 PM  Radiology: CT Angio Chest PE W/Cm &/Or Wo Cm Result Date: 09/11/2024 CLINICAL DATA:  Chronic dyspnea, worse over the past few days. History of lung cancer, COPD and CHF. Smoker. EXAM: CT ANGIOGRAPHY CHEST WITH CONTRAST TECHNIQUE: Multidetector CT imaging of the chest was performed using the standard protocol during bolus administration of intravenous contrast. Multiplanar CT image reconstructions and MIPs were obtained to evaluate the vascular anatomy. RADIATION DOSE REDUCTION: This exam was performed  according to the departmental dose-optimization program which includes automated exposure control, adjustment of the mA and/or kV according to patient size and/or use of iterative reconstruction technique. CONTRAST:  75mL OMNIPAQUE  IOHEXOL  350 MG/ML SOLN COMPARISON:  06/15/2024 and chest radiographs dated 09/11/2024 and 08/21/2024. FINDINGS: Cardiovascular: Normally opacified pulmonary arteries with no pulmonary arterial filling defects seen. Stable enlarged heart and small pericardial effusion measuring up to 1 cm in thickness. Stable aneurysmal dilatation of the ascending thoracic aorta, currently measuring 4.3 cm in maximum diameter. Stable enlarged central pulmonary arteries with a main pulmonary artery diameter of 3.7 cm. Mediastinum/Nodes: No enlarged mediastinal, hilar, or axillary lymph nodes. Thyroid  gland, trachea, and esophagus demonstrate no significant findings. Lungs/Pleura: Interval moderate-sized right  pleural effusion increased probable postradiation changes/atelectasis in the medial right upper lobe and right hilar region with associated diffuse bronchial narrowing in those areas. Increased size of a mass-like density in the medial left lower lobe. This currently measures 3.7 x 2.2 cm on image number 78/6, previously 1.6 x 1.4 cm on 06/15/2024 and 1.3 x 1.1 cm on 05/18/2024. Upper Abdomen: Stable previously characterized left adrenal adenoma. Musculoskeletal: Thoracic spine degenerative changes. No evidence of bony metastatic disease. Review of the MIP images confirms the above findings. IMPRESSION: 1. No pulmonary emboli. 2. Increased size of a mass-like density in the medial left lower lobe, currently measuring 3.7 x 2.2 cm, previously 1.6 x 1.4 cm on 06/15/2024 and 1.3 x 1.1 cm on 05/18/2024. This is suspicious for an enlarging lung carcinoma. Recommend further evaluation with elective outpatient PET-CT. 3. Increased probable postradiation changes/atelectasis in the medial right upper lobe and  right hilar region with associated diffuse bronchial narrowing in those areas. 4. Interval moderate-sized right pleural effusion. 5. Stable cardiomegaly and small pericardial effusion. 6. Stable 4.3 cm ascending thoracic aortic aneurysm. 7. Stable enlarged central pulmonary arteries, compatible with pulmonary arterial hypertension. 8. Stable previously characterized left adrenal adenoma. Electronically Signed   By: Elspeth Bathe M.D.   On: 09/11/2024 17:07   DG Chest 1 View Result Date: 09/11/2024 CLINICAL DATA:  Shortness of breath EXAM: CHEST  1 VIEW COMPARISON:  08/21/2024, CT 06/15/2024, 12/17/2023, 09/16/2023, 10/16/2023 chest x-ray 18 2025, 01/15/2023 FINDINGS: Volume loss right thorax. Distortion and right hilar opacity corresponding to known lung mass and treatment changes. Bandlike density extending to the right upper lung pleural surface. Stable cardiomediastinal silhouette. No pneumothorax. Possible small right pleural effusion IMPRESSION: Volume loss right thorax with distortion and right hilar opacity corresponding to known lung mass and treatment changes. Possible small right pleural effusion. Electronically Signed   By: Luke Bun M.D.   On: 09/11/2024 15:45     .Critical Care  Performed by: Neldon Hamp RAMAN, PA Authorized by: Neldon Hamp RAMAN, PA   Critical care provider statement:    Critical care time (minutes):  35   Critical care time was exclusive of:  Separately billable procedures and treating other patients and teaching time   Critical care was necessary to treat or prevent imminent or life-threatening deterioration of the following conditions:  Respiratory failure   Critical care was time spent personally by me on the following activities:  Development of treatment plan with patient or surrogate, review of old charts, re-evaluation of patient's condition, pulse oximetry, ordering and review of radiographic studies, ordering and review of laboratory studies, ordering and  performing treatments and interventions, obtaining history from patient or surrogate, examination of patient and evaluation of patient's response to treatment   Care discussed with: admitting provider      Medications Ordered in the ED  albuterol  (PROVENTIL ) (2.5 MG/3ML) 0.083% nebulizer solution (10 mg/hr Nebulization New Bag/Given 09/11/24 1451)  magnesium  sulfate IVPB 2 g 50 mL (0 g Intravenous Stopped 09/11/24 1444)  furosemide  (LASIX ) injection 60 mg (60 mg Intravenous Given 09/11/24 1558)  iohexol  (OMNIPAQUE ) 350 MG/ML injection 75 mL (75 mLs Intravenous Contrast Given 09/11/24 1633)  metoCLOPramide  (REGLAN ) tablet 10 mg (10 mg Oral Given 09/11/24 1851)  acetaminophen  (TYLENOL ) tablet 1,000 mg (1,000 mg Oral Given 09/11/24 1851)  labetalol  (NORMODYNE ) injection 10 mg (10 mg Intravenous Given 09/11/24 2107)  albuterol  (PROVENTIL ) (2.5 MG/3ML) 0.083% nebulizer solution 5 mg (5 mg Nebulization Given 09/11/24 2005)  albuterol  (VENTOLIN  HFA) 108 (  90 Base) MCG/ACT inhaler 1 puff (1 puff Inhalation Given 09/11/24 2017)    Clinical Course as of 09/11/24 2146  Mon Sep 11, 2024  1734 IMPRESSION: 1. No pulmonary emboli. 2. Increased size of a mass-like density in the medial left lower lobe, currently measuring 3.7 x 2.2 cm, previously 1.6 x 1.4 cm on 06/15/2024 and 1.3 x 1.1 cm on 05/18/2024. This is suspicious for an enlarging lung carcinoma. Recommend further evaluation with elective outpatient PET-CT. 3. Increased probable postradiation changes/atelectasis in the medial right upper lobe and right hilar region with associated diffuse bronchial narrowing in those areas. 4. Interval moderate-sized right pleural effusion. 5. Stable cardiomegaly and small pericardial effusion. 6. Stable 4.3 cm ascending thoracic aortic aneurysm. 7. Stable enlarged central pulmonary arteries, compatible with pulmonary arterial hypertension. 8. Stable previously characterized left adrenal adenoma.   [WF]     Clinical Course User Index [WF] Neldon Hamp RAMAN, [place]                                 Medical Decision Making Amount and/or Complexity of Data Reviewed Radiology: ordered.  Risk OTC drugs. Prescription drug management. Decision regarding hospitalization.   This patient presents to the ED for concern of SOB, this involves a number of treatment options, and is a complaint that carries with it a moderate to high risk of complications and morbidity. A differential diagnosis was considered for the patient's symptoms which is discussed below:   The causes for shortness of breath include but are not limited to Cardiac (AHF, pericardial effusion and tamponade, arrhythmias, ischemia, etc) Respiratory (COPD, asthma, pneumonia, pneumothorax, primary pulmonary hypertension, PE/VQ mismatch) Hematological (anemia) Neuromuscular (ALS, Guillain-Barr, etc)     Co morbidities: Discussed in HPI   Brief History:  Patient is a 53 year old male with a past medical history significant for lung cancer on palliative radiation, chemo, immunotherapy stopped due to nonadherence with treatment, history of DVT, tobacco abuse, polysubstance abuse, COPD, CHF on 4 to 5 L of oxygen  chronically by nasal cannula  He presents emergency room today he states he has felt dyspneic since he left the hospital seems to have been worse over the past couple days.  He left AGAINST MEDICAL ADVICE after 18 hours of admission 9/8.  Received Solu-Medrol  IV and 2 DuoNebs by EMS.  He states he is feels that he is breathing slightly better.  Denies any chest pain but states he feels some chest tightness no nausea or vomiting.     EMR reviewed including pt PMHx, past surgical history and past visits to ER.   See HPI for more details   Lab Tests:  I ordered and independently interpreted labs. Labs notable for    Imaging Studies:  Abnormal findings. I personally reviewed all imaging studies. Imaging notable for  CT PE  study without pulmonary emboli increased size of lung mass some increased postradiation changes with diffuse bronchial narrowing noted in right upper lobe/right hilar region pleural effusion present, cardiomegaly with small pericardial effusion Evidence of pulmonary hypertension  Cardiac Monitoring:  The patient was maintained on a cardiac monitor.  I personally viewed and interpreted the cardiac monitored which showed an underlying rhythm of: Sinus tachycardia EKG non-ischemic   Medicines ordered:  I ordered medication including Tylenol , Reglan , lasix  60mg , magnesium  2g, labetalol , albuterol  for dyspnea, hypoxia Reevaluation of the patient after these medicines showed that the patient improved I have reviewed the patients home medicines  and have made adjustments as needed   Critical Interventions:   high flow nasal cannula   Consults/Attending Physician   I discussed this case with my attending physician who cosigned this note including patient's presenting symptoms, physical exam, and planned diagnostics and interventions. Attending physician stated agreement with plan or made changes to plan which were implemented.  Dr. Tobie to admit  Reevaluation:  After the interventions noted above I re-evaluated patient and found that they have :improved   Social Determinants of Health:      Problem List / ED Course:  Dyspnea hypoxia even on baseline oxygen  requiring increased oxygen .  Patient is intervally improved with labetalol  Lasix  albuterol  and heated high flow nasal cannula.  Currently is 99% on heated high flow nasal cannula not dyspneic heart rate is now in the 90s respiratory rate approximately 20 patient mated to hospitalist service.   Dispostion:  After consideration of the diagnostic results and the patients response to treatment, I feel that the patent would benefit from admission.    Final diagnoses:  COPD exacerbation (HCC)  Acute congestive heart failure,  unspecified heart failure type [facility])  Hypoxia    ED Discharge Orders     None          Neldon Hamp RAMAN, [place] 09/11/24 2148    Tegeler, Lonni PARAS, MD 09/11/24 2320

## 2024-09-11 NOTE — ED Triage Notes (Signed)
 PT BIB EMS from home. Pt is on 6l [place] at all times. Hx of lung cancer and COPD and leg swelling. Pt denies chest pain.   EMS  72f larm Solumedrol 2x duoneb
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
## 2024-09-11 NOTE — ED Notes (Signed)
Patient up on the side of the bed eating

## 2024-09-12 NOTE — ED Notes (Signed)
 Pt signed AMA Forms. Family refuses to pick pt up with o2 tank. Pt does not care and still wants to leave

## 2024-09-12 NOTE — ED Notes (Signed)
 Patient continues to take himself off of the cardiac monitor. Explained to the patient the importance of staying on. Pt is uncooperative

## 2024-09-12 NOTE — ED Notes (Addendum)
 Explained to pt constantly the importance of staying. Pt is obviously in noticeable distress. Pt stated that he does not care and that he wants to go home. Explained to Dr.Patel and charge nurse that pt is wanting to leave AMA. Pt is very A&Ox4, but can barely walk. Dr.Patel along with respiratory and other nurses has explained risks of leaving to pt. Pt still uncooperative

## 2024-09-12 NOTE — ED Notes (Signed)
 Patient disconnected himself from the high flow cannula, pulse ox and bp. Explained to pt that it is important to keep those things on. Pt keeps asking why if there is nothing wrong with him and he can breathe. Pt continues to be non compliant and uncooperative.

## 2024-09-12 NOTE — ED Notes (Addendum)
 Pt states in regards to leaving AMA You all did a great job but my baby girl is getting locked up and I need to get my grandbaby.

## 2024-09-12 NOTE — Progress Notes (Signed)
 PT on HHFNC insisted on leaving, RT called by RN. I encouraged the PT to stay and continue usage of HHF. Pt was aware of all of the contraindications of leaving. PT continued to refuse and went into the hallway, pt was very SOB and continued to leave AMA and I insisted for PT to at least sit on the [facility] until his ride arrived. PT went back to Rm-35 and got back on the [facility] for about many 10 minutes. Pt then proceeded to sit in the lobby to wait for his ride. RT placed pt on oxygen  in lobby.

## 2024-09-15 ENCOUNTER — Inpatient Hospital Stay: Attending: Internal Medicine

## 2024-09-15 ENCOUNTER — Ambulatory Visit (HOSPITAL_COMMUNITY): Attending: Internal Medicine

## 2024-09-15 DIAGNOSIS — Z8673 Personal history of transient ischemic attack (TIA), and cerebral infarction without residual deficits: Secondary | ICD-10-CM | POA: Insufficient documentation

## 2024-09-15 DIAGNOSIS — Z91199 Patient's noncompliance with other medical treatment and regimen due to unspecified reason: Secondary | ICD-10-CM | POA: Insufficient documentation

## 2024-09-15 DIAGNOSIS — Z9981 Dependence on supplemental oxygen: Secondary | ICD-10-CM | POA: Insufficient documentation

## 2024-09-15 DIAGNOSIS — Z86718 Personal history of other venous thrombosis and embolism: Secondary | ICD-10-CM | POA: Insufficient documentation

## 2024-09-15 DIAGNOSIS — Z79899 Other long term (current) drug therapy: Secondary | ICD-10-CM | POA: Insufficient documentation

## 2024-09-15 DIAGNOSIS — C7A8 Other malignant neuroendocrine tumors: Secondary | ICD-10-CM | POA: Insufficient documentation

## 2024-09-19 ENCOUNTER — Telehealth: Payer: Self-pay | Admitting: Medical Oncology

## 2024-09-19 ENCOUNTER — Telehealth: Payer: Self-pay

## 2024-09-19 NOTE — Telephone Encounter (Signed)
 [facility] navigator company asking if we have seen pt, has he kept appts.?  I talked to pt and he confirmed appt next week with [facility].

## 2024-09-19 NOTE — Telephone Encounter (Signed)
 Attempted to contact patient regarding CT scan appointment scheduled for tomorrow. Left voicemail informing patient that since a CT scan was performed during his recent hospital stay, the CT scheduled for tomorrow is no longer needed. Requested a return call from patient.

## 2024-09-20 ENCOUNTER — Telehealth: Payer: Self-pay

## 2024-09-20 ENCOUNTER — Ambulatory Visit (HOSPITAL_COMMUNITY)

## 2024-09-20 ENCOUNTER — Telehealth: Payer: Self-pay | Admitting: Medical Oncology

## 2024-09-20 NOTE — Telephone Encounter (Signed)
 Received secure chat from Oncology regarding the pt oxygen  needs.   Pt is asking about his oxygen  and needing a portable walking tank. He tried calling [employer] but states he was not able to get any information and needed us  to order his oxygen . It looks like you all have seen him before, would you be able to follow up with this oxygen  request?  Per RB pt can be seen with any APP.  Pt has been scheduled 10/23 with TP.  Pt is aware and verbalized understanding.  NFN

## 2024-09-20 NOTE — Telephone Encounter (Signed)
 Returned call to pt today regarding CT scan cancel. Rosina, LPN attempted to contact pt yesterday regarding cancel for the CT since he just had one in the hospital. Pt also requesting we reach out to [employer] about his oxygen , message sent to pulmonology regarding additional oxygen  needs. Pt verbalized understanding and is aware of his 09/26/24 appt.

## 2024-09-21 ENCOUNTER — Inpatient Hospital Stay (HOSPITAL_COMMUNITY)

## 2024-09-21 ENCOUNTER — Inpatient Hospital Stay (HOSPITAL_COMMUNITY)
Admission: EM | Admit: 2024-09-21 | Discharge: 2024-09-25 | DRG: 189 | Disposition: A | Discharge status: Home / Self Care | Attending: Internal Medicine | Admitting: Internal Medicine

## 2024-09-21 ENCOUNTER — Emergency Department (HOSPITAL_COMMUNITY)

## 2024-09-21 ENCOUNTER — Other Ambulatory Visit: Payer: Self-pay

## 2024-09-21 DIAGNOSIS — E875 Hyperkalemia: Secondary | ICD-10-CM

## 2024-09-21 DIAGNOSIS — Z79899 Other long term (current) drug therapy: Secondary | ICD-10-CM

## 2024-09-21 DIAGNOSIS — M7989 Other specified soft tissue disorders: Secondary | ICD-10-CM | POA: Diagnosis not present

## 2024-09-21 DIAGNOSIS — R0602 Shortness of breath: Secondary | ICD-10-CM | POA: Diagnosis present

## 2024-09-21 DIAGNOSIS — Z7951 Long term (current) use of inhaled steroids: Secondary | ICD-10-CM | POA: Diagnosis not present

## 2024-09-21 DIAGNOSIS — Y842 Radiological procedure and radiotherapy as the cause of abnormal reaction of the patient, or of later complication, without mention of misadventure at the time of the procedure: Secondary | ICD-10-CM | POA: Diagnosis present

## 2024-09-21 DIAGNOSIS — J701 Chronic and other pulmonary manifestations due to radiation: Secondary | ICD-10-CM | POA: Diagnosis present

## 2024-09-21 DIAGNOSIS — I110 Hypertensive heart disease with heart failure: Secondary | ICD-10-CM | POA: Diagnosis present

## 2024-09-21 DIAGNOSIS — R0609 Other forms of dyspnea: Secondary | ICD-10-CM

## 2024-09-21 DIAGNOSIS — Z7982 Long term (current) use of aspirin: Secondary | ICD-10-CM

## 2024-09-21 DIAGNOSIS — J441 Chronic obstructive pulmonary disease with (acute) exacerbation: Secondary | ICD-10-CM | POA: Diagnosis present

## 2024-09-21 DIAGNOSIS — R918 Other nonspecific abnormal finding of lung field: Secondary | ICD-10-CM

## 2024-09-21 DIAGNOSIS — J9621 Acute and chronic respiratory failure with hypoxia: Secondary | ICD-10-CM | POA: Diagnosis present

## 2024-09-21 DIAGNOSIS — J90 Pleural effusion, not elsewhere classified: Secondary | ICD-10-CM | POA: Diagnosis not present

## 2024-09-21 DIAGNOSIS — Z91199 Patient's noncompliance with other medical treatment and regimen due to unspecified reason: Secondary | ICD-10-CM | POA: Diagnosis not present

## 2024-09-21 DIAGNOSIS — Z85118 Personal history of other malignant neoplasm of bronchus and lung: Secondary | ICD-10-CM | POA: Diagnosis not present

## 2024-09-21 DIAGNOSIS — Z833 Family history of diabetes mellitus: Secondary | ICD-10-CM | POA: Diagnosis not present

## 2024-09-21 DIAGNOSIS — J9601 Acute respiratory failure with hypoxia: Secondary | ICD-10-CM | POA: Diagnosis present

## 2024-09-21 DIAGNOSIS — Z923 Personal history of irradiation: Secondary | ICD-10-CM | POA: Diagnosis not present

## 2024-09-21 DIAGNOSIS — G40909 Epilepsy, unspecified, not intractable, without status epilepticus: Secondary | ICD-10-CM | POA: Diagnosis present

## 2024-09-21 DIAGNOSIS — Z1152 Encounter for screening for COVID-19: Secondary | ICD-10-CM | POA: Diagnosis not present

## 2024-09-21 DIAGNOSIS — Z9981 Dependence on supplemental oxygen: Secondary | ICD-10-CM

## 2024-09-21 DIAGNOSIS — I5033 Acute on chronic diastolic (congestive) heart failure: Secondary | ICD-10-CM | POA: Diagnosis present

## 2024-09-21 DIAGNOSIS — F329 Major depressive disorder, single episode, unspecified: Secondary | ICD-10-CM | POA: Diagnosis present

## 2024-09-21 DIAGNOSIS — I5030 Unspecified diastolic (congestive) heart failure: Secondary | ICD-10-CM | POA: Diagnosis not present

## 2024-09-21 DIAGNOSIS — C3490 Malignant neoplasm of unspecified part of unspecified bronchus or lung: Secondary | ICD-10-CM

## 2024-09-21 DIAGNOSIS — F17210 Nicotine dependence, cigarettes, uncomplicated: Secondary | ICD-10-CM | POA: Diagnosis present

## 2024-09-21 DIAGNOSIS — Z8673 Personal history of transient ischemic attack (TIA), and cerebral infarction without residual deficits: Secondary | ICD-10-CM

## 2024-09-21 LAB — CBC WITH DIFFERENTIAL/PLATELET
Abs Immature Granulocytes: 0.02 K/uL (ref 0.00–0.07)
Basophils Absolute: 0 K/uL (ref 0.0–0.1)
Basophils Relative: 0 %
Eosinophils Absolute: 0 K/uL (ref 0.0–0.5)
Eosinophils Relative: 0 %
HCT: 48 % (ref 39.0–52.0)
Hemoglobin: 15.6 g/dL (ref 13.0–17.0)
Immature Granulocytes: 0 %
Lymphocytes Relative: 7 %
Lymphs Abs: 0.4 K/uL — ABNORMAL LOW (ref 0.7–4.0)
MCH: 32 pg (ref 26.0–34.0)
MCHC: 32.5 g/dL (ref 30.0–36.0)
MCV: 98.4 fL (ref 80.0–100.0)
Monocytes Absolute: 0.8 K/uL (ref 0.1–1.0)
Monocytes Relative: 14 %
Neutro Abs: 4.8 K/uL (ref 1.7–7.7)
Neutrophils Relative %: 79 %
Platelets: 239 K/uL (ref 150–400)
RBC: 4.88 MIL/uL (ref 4.22–5.81)
RDW: 15.7 % — ABNORMAL HIGH (ref 11.5–15.5)
WBC: 6.1 K/uL (ref 4.0–10.5)
nRBC: 0 % (ref 0.0–0.2)

## 2024-09-21 LAB — BASIC METABOLIC PANEL WITH GFR
Anion gap: 12 (ref 5–15)
Anion gap: 15 (ref 5–15)
BUN: 9 mg/dL (ref 6–20)
BUN: 15 mg/dL (ref 6–20)
CO2: 21 mmol/L — ABNORMAL LOW (ref 22–32)
CO2: 20 mmol/L — ABNORMAL LOW (ref 22–32)
Calcium: 8.4 mg/dL — ABNORMAL LOW (ref 8.9–10.3)
Calcium: 8.7 mg/dL — ABNORMAL LOW (ref 8.9–10.3)
Chloride: 107 mmol/L (ref 98–111)
Chloride: 102 mmol/L (ref 98–111)
Creatinine, Ser: 0.75 mg/dL (ref 0.61–1.24)
Creatinine, Ser: 0.99 mg/dL (ref 0.61–1.24)
GFR, Estimated: >60 mL/min (ref >60)
GFR, Estimated: >60 mL/min (ref >60)
Glucose, Bld: 88 mg/dL (ref 70–99)
Glucose, Bld: 131 mg/dL — ABNORMAL HIGH (ref 70–99)
Potassium: 3.8 mmol/L (ref 3.5–5.1)
Potassium: 4.7 mmol/L (ref 3.5–5.1)
Sodium: 140 mmol/L (ref 135–145)
Sodium: 137 mmol/L (ref 135–145)

## 2024-09-21 LAB — CBC
HCT: 53.8 % — ABNORMAL HIGH (ref 39.0–52.0)
Hemoglobin: 17.1 g/dL — ABNORMAL HIGH (ref 13.0–17.0)
MCH: 32 pg (ref 26.0–34.0)
MCHC: 31.8 g/dL (ref 30.0–36.0)
MCV: 100.7 fL — ABNORMAL HIGH (ref 80.0–100.0)
Platelets: 252 K/uL (ref 150–400)
RBC: 5.34 MIL/uL (ref 4.22–5.81)
RDW: 15.7 % — ABNORMAL HIGH (ref 11.5–15.5)
WBC: 7.4 K/uL (ref 4.0–10.5)
nRBC: 0 % (ref 0.0–0.2)

## 2024-09-21 LAB — RAPID URINE DRUG SCREEN, HOSP PERFORMED
Amphetamines: NOT DETECTED
Barbiturates: NOT DETECTED
Benzodiazepines: NOT DETECTED
Cocaine: NOT DETECTED
Opiates: NOT DETECTED
Tetrahydrocannabinol: NOT DETECTED

## 2024-09-21 LAB — CREATININE, SERUM
Creatinine, Ser: 0.8 mg/dL (ref 0.61–1.24)
GFR, Estimated: >60 mL/min (ref >60)

## 2024-09-21 LAB — GLUCOSE, CAPILLARY: Glucose-Capillary: 146 mg/dL — ABNORMAL HIGH (ref 70–99)

## 2024-09-21 LAB — I-STAT VENOUS BLOOD GAS, ED
Acid-Base Excess: 4 mmol/L — ABNORMAL HIGH (ref 0.0–2.0)
Bicarbonate: 26.9 mmol/L (ref 20.0–28.0)
Calcium, Ion: 0.9 mmol/L — ABNORMAL LOW (ref 1.15–1.40)
HCT: 54 % — ABNORMAL HIGH (ref 39.0–52.0)
Hemoglobin: 18.4 g/dL — ABNORMAL HIGH (ref 13.0–17.0)
O2 Saturation: 100 %
Potassium: 6.7 mmol/L (ref 3.5–5.1)
Sodium: 135 mmol/L (ref 135–145)
TCO2: 28 mmol/L (ref 22–32)
pCO2, Ven: 34.4 mmHg — ABNORMAL LOW (ref 44–60)
pH, Ven: 7.502 — ABNORMAL HIGH (ref 7.25–7.43)
pO2, Ven: 219 mmHg — ABNORMAL HIGH (ref 32–45)

## 2024-09-21 LAB — BODY FLUID CELL COUNT WITH DIFFERENTIAL
Eos, Fluid: 0 %
Lymphs, Fluid: 13 %
Monocyte-Macrophage-Serous Fluid: 12 % — ABNORMAL LOW (ref 50–90)
Neutrophil Count, Fluid: 75 % — ABNORMAL HIGH (ref 0–25)
Total Nucleated Cell Count, Fluid: 1124 uL — ABNORMAL HIGH (ref 0–1000)

## 2024-09-21 LAB — PROTEIN, TOTAL: Total Protein: 7 g/dL (ref 6.5–8.1)

## 2024-09-21 LAB — BRAIN NATRIURETIC PEPTIDE: B Natriuretic Peptide: 376.9 pg/mL — ABNORMAL HIGH (ref 0.0–100.0)

## 2024-09-21 LAB — TROPONIN I (HIGH SENSITIVITY)
Troponin I (High Sensitivity): 19 ng/L — ABNORMAL HIGH (ref <18)
Troponin I (High Sensitivity): 17 ng/L (ref <18)

## 2024-09-21 LAB — ECHOCARDIOGRAM COMPLETE BUBBLE STUDY: S' Lateral: 2.8 cm

## 2024-09-21 LAB — D-DIMER, QUANTITATIVE: D-Dimer, Quant: 3.16 ug{FEU}/mL — ABNORMAL HIGH (ref 0.00–0.50)

## 2024-09-21 LAB — PROTEIN, PLEURAL OR PERITONEAL FLUID: Total protein, fluid: 3.8 g/dL

## 2024-09-21 LAB — LACTATE DEHYDROGENASE, PLEURAL OR PERITONEAL FLUID: LD, Fluid: 142 U/L — ABNORMAL HIGH (ref 3–23)

## 2024-09-21 LAB — RESP PANEL BY RT-PCR (RSV, FLU A&B, COVID)  RVPGX2
Influenza A by PCR: NEGATIVE
Influenza B by PCR: NEGATIVE
Resp Syncytial Virus by PCR: NEGATIVE
SARS Coronavirus 2 by RT PCR: NEGATIVE

## 2024-09-21 LAB — LACTATE DEHYDROGENASE: LDH: 342 U/L — ABNORMAL HIGH (ref 98–192)

## 2024-09-21 MED ORDER — ASPIRIN 81 MG PO CHEW
81.0000 mg | CHEWABLE_TABLET | Freq: Every day | ORAL | Status: DC
Start: 2024-09-21 — End: 2024-09-25
  Administered 2024-09-22 – 2024-09-24 (×3): 81 mg via ORAL
  Filled 2024-09-21 (×4): qty 1

## 2024-09-21 MED ORDER — FENTANYL CITRATE (PF) 50 MCG/ML IJ SOSY: 100.0000 ug | PREFILLED_SYRINGE | INTRAMUSCULAR | Status: AC

## 2024-09-21 MED ORDER — BUDESONIDE 0.5 MG/2ML IN SUSP
0.5000 mg | Freq: Two times a day (BID) | RESPIRATORY_TRACT | Status: DC
  Administered 2024-09-21 – 2024-09-25 (×8): 0.5 mg via RESPIRATORY_TRACT
  Filled 2024-09-21 (×8): qty 2

## 2024-09-21 MED ORDER — IPRATROPIUM-ALBUTEROL 0.5-2.5 (3) MG/3ML IN SOLN
3.0000 mL | Freq: Once | RESPIRATORY_TRACT | Status: AC
  Administered 2024-09-21: 3 mL via RESPIRATORY_TRACT
  Filled 2024-09-21: qty 3

## 2024-09-21 MED ORDER — ALBUTEROL SULFATE (2.5 MG/3ML) 0.083% IN NEBU: 2.5000 mg | INHALATION_SOLUTION | Freq: Four times a day (QID) | RESPIRATORY_TRACT | Status: DC | PRN

## 2024-09-21 MED ORDER — CHLORHEXIDINE GLUCONATE CLOTH 2 % EX PADS
6.0000 | MEDICATED_PAD | Freq: Every day | CUTANEOUS | Status: DC
  Administered 2024-09-22 – 2024-09-24 (×3): 6 via TOPICAL

## 2024-09-21 MED ORDER — ENOXAPARIN SODIUM 40 MG/0.4ML IJ SOSY
40.0000 mg | PREFILLED_SYRINGE | INTRAMUSCULAR | Status: DC
  Administered 2024-09-21 – 2024-09-24 (×4): 40 mg via SUBCUTANEOUS
  Filled 2024-09-21 (×5): qty 0.4

## 2024-09-21 MED ORDER — LEVETIRACETAM 500 MG PO TABS
500.0000 mg | ORAL_TABLET | Freq: Two times a day (BID) | ORAL | Status: DC
  Administered 2024-09-21 – 2024-09-24 (×7): 500 mg via ORAL
  Filled 2024-09-21 (×8): qty 1

## 2024-09-21 MED ORDER — HYDRALAZINE HCL 20 MG/ML IJ SOLN: 10.0000 mg | INTRAMUSCULAR | Status: DC | PRN

## 2024-09-21 MED ORDER — FENTANYL CITRATE (PF) 50 MCG/ML IJ SOSY
PREFILLED_SYRINGE | INTRAMUSCULAR | Status: AC
  Administered 2024-09-21: 100 ug via INTRAVENOUS
  Filled 2024-09-21: qty 2

## 2024-09-21 MED ORDER — REVEFENACIN 175 MCG/3ML IN SOLN
175.0000 ug | Freq: Every day | RESPIRATORY_TRACT | Status: DC
  Administered 2024-09-22 – 2024-09-25 (×4): 175 ug via RESPIRATORY_TRACT
  Filled 2024-09-21 (×4): qty 3

## 2024-09-21 MED ORDER — ARFORMOTEROL TARTRATE 15 MCG/2ML IN NEBU
15.0000 ug | INHALATION_SOLUTION | Freq: Two times a day (BID) | RESPIRATORY_TRACT | Status: DC
  Administered 2024-09-21 – 2024-09-25 (×8): 15 ug via RESPIRATORY_TRACT
  Filled 2024-09-21 (×8): qty 2

## 2024-09-21 MED ORDER — IPRATROPIUM-ALBUTEROL 0.5-2.5 (3) MG/3ML IN SOLN: 3.0000 mL | Freq: Four times a day (QID) | RESPIRATORY_TRACT | Status: DC

## 2024-09-21 MED ORDER — PREDNISONE 20 MG PO TABS
40.0000 mg | ORAL_TABLET | Freq: Every day | ORAL | Status: DC
Start: 2024-09-22 — End: 2024-09-24
  Administered 2024-09-22 – 2024-09-24 (×3): 40 mg via ORAL
  Filled 2024-09-21 (×3): qty 2

## 2024-09-21 MED ORDER — FUROSEMIDE 10 MG/ML IJ SOLN: 80.0000 mg | Freq: Two times a day (BID) | INTRAMUSCULAR | Status: DC

## 2024-09-21 MED ORDER — FUROSEMIDE 10 MG/ML IJ SOLN
80.0000 mg | Freq: Once | INTRAMUSCULAR | Status: AC
  Administered 2024-09-21: 80 mg via INTRAVENOUS
  Filled 2024-09-21: qty 8

## 2024-09-21 MED ORDER — SERTRALINE HCL 50 MG PO TABS
25.0000 mg | ORAL_TABLET | Freq: Every day | ORAL | Status: DC
  Administered 2024-09-21 – 2024-09-24 (×4): 25 mg via ORAL
  Filled 2024-09-21 (×4): qty 1

## 2024-09-21 MED ORDER — LOSARTAN POTASSIUM 50 MG PO TABS
100.0000 mg | ORAL_TABLET | Freq: Every day | ORAL | Status: DC
  Administered 2024-09-21 – 2024-09-24 (×4): 100 mg via ORAL
  Filled 2024-09-21 (×5): qty 2

## 2024-09-21 MED ORDER — BUDESON-GLYCOPYRROL-FORMOTEROL 160-9-4.8 MCG/ACT IN AERO
2.0000 | INHALATION_SPRAY | Freq: Two times a day (BID) | RESPIRATORY_TRACT | Status: DC
  Administered 2024-09-21: 2 via RESPIRATORY_TRACT
  Filled 2024-09-21: qty 5.9

## 2024-09-21 MED ORDER — DEXAMETHASONE SODIUM PHOSPHATE 10 MG/ML IJ SOLN
10.0000 mg | Freq: Once | INTRAMUSCULAR | Status: AC
  Administered 2024-09-21: 10 mg via INTRAVENOUS
  Filled 2024-09-21: qty 1

## 2024-09-21 MED ORDER — FUROSEMIDE 10 MG/ML IJ SOLN: 40.0000 mg | Freq: Two times a day (BID) | INTRAMUSCULAR | Status: DC

## 2024-09-21 NOTE — Progress Notes (Signed)
 Pt currently on 14l HFNC sitting in recliner eating breakfast. Sats are 91% and VSS. Pt has no complaints of shortness of breath like previously this AM when a non-rebreather was placed. RT will monitor.

## 2024-09-21 NOTE — ED Provider Notes (Signed)
 [place] EMERGENCY DEPARTMENT AT [facility] Provider Note   CSN: [id number] Arrival date & time: 09/21/24  9671     Patient presents with: Shortness of Breath   Richard [name] is a 53 y.o. male.   HPI    This is a 53 year old male with history of COPD on home oxygen  at 5 L who presents with shortness of breath.  Patient reports that his home oxygen  tank began to run out earlier yesterday.  He had progressively worsening shortness of breath throughout the day.  He normally wears 5 L at baseline.  He has not recently had any cough or fevers.  Upon EMS arrival, his O2 tank was empty.  He denies chest pain.   Prior to Admission medications   Medication Sig Start Date End Date Taking? Authorizing Provider  albuterol  (PROVENTIL ) (2.5 MG/3ML) 0.083% nebulizer solution Take 2.5 mg by nebulization 3 (three) times daily as needed for shortness of breath or wheezing. 05/11/24   [provider]  albuterol  (VENTOLIN  HFA) 108 (90 Base) MCG/ACT inhaler Inhale 2 puffs into the lungs every 4 (four) hours as needed for wheezing or shortness of breath. 07/31/24   Cottie Donnice PARAS, MD  aspirin  81 MG chewable tablet Chew 81 mg by mouth daily.    [provider]  cloNIDine  (CATAPRES ) 0.1 MG tablet Take 0.1-0.2 mg by mouth See admin instructions. Take 2 tablets (0.2mg ) by mouth in the morning and evening (0900 and 1700) and take 1 tablet (0.1mg ) at bedtime.    [provider]  furosemide  (LASIX ) 40 MG tablet Take 1 tablet (40 mg total) by mouth daily. Patient taking differently: Take 40 mg by mouth 2 (two) times daily. 05/22/24 10/21/24  [place], Camellia PARAS, DO  levETIRAcetam  (KEPPRA ) 500 MG tablet Take 1 tablet (500 mg total) by mouth 2 (two) times daily. 01/17/23   Norrine Sharper, MD  losartan  (COZAAR ) 100 MG tablet Take 100 mg by mouth daily. 12/31/22   [provider]  sertraline  (ZOLOFT ) 25 MG tablet Take 25 mg by mouth daily. 07/23/23   [provider]   SYMBICORT  160-4.5 MCG/ACT inhaler Inhale 2 puffs into the lungs in the morning and at bedtime. 08/25/24   [provider]  TRELEGY ELLIPTA  100-62.5-25 MCG/ACT AEPB Inhale 1 puff into the lungs daily. 07/05/24   [provider]    Allergies: Dilaudid  [hydromorphone  hcl] and Tape    Review of Systems  Constitutional:  Negative for fever.  Respiratory:  Positive for shortness of breath. Negative for cough.   Cardiovascular:  Negative for chest pain.  Gastrointestinal:  Negative for abdominal pain.  All other systems reviewed and are negative.   Updated Vital Signs BP (!) 165/114   Pulse 100   Temp (!) 97.4 F (36.3 C) (Axillary)   Resp (!) 26   Ht 1.753 m (5' 9)   SpO2 100%   BMI 33.21 kg/m   Physical Exam Vitals and nursing note reviewed.  Constitutional:      Appearance: He is well-developed.     Comments: Chronically ill-appearing but no acute distress  HENT:     Head: Normocephalic and atraumatic.  Eyes:     Pupils: Pupils are equal, round, and reactive to light.  Cardiovascular:     Rate and Rhythm: Regular rhythm. Tachycardia present.     Heart sounds: Normal heart sounds. No murmur heard. Pulmonary:     Effort: Pulmonary effort is normal. No tachypnea or respiratory distress.     Breath  sounds: Wheezing present.     Comments: Poor air movement in all lung fields, expiratory wheeze noted, normal effort, no increased work of breathing Abdominal:     General: Bowel sounds are normal.     Palpations: Abdomen is soft.     Tenderness: There is no abdominal tenderness. There is no rebound.  Musculoskeletal:     Cervical back: Neck supple.  Lymphadenopathy:     Cervical: No cervical adenopathy.  Skin:    General: Skin is warm and dry.  Neurological:     Mental Status: He is alert and oriented to person, place, and time.  Psychiatric:        Mood and Affect: Mood normal.     (all labs ordered are listed, but only abnormal results are  displayed) Labs Reviewed  CBC WITH DIFFERENTIAL/PLATELET - Abnormal; Notable for the following components:      Result Value   RDW 15.7 (*)    Lymphs Abs 0.4 (*)    All other components within normal limits  BASIC METABOLIC PANEL WITH GFR - Abnormal; Notable for the following components:   CO2 21 (*)    Calcium 8.4 (*)    All other components within normal limits  RESP PANEL BY RT-PCR (RSV, FLU A&B, COVID)  [id number]    EKG: EKG Interpretation Date/Time:  Thursday September 21 2024 03:49:16 EDT Ventricular Rate:  135 PR Interval:  119 QRS Duration:  120 QT Interval:  311 QTC Calculation: 467 R Axis:   68  Text Interpretation: Sinus tachycardia Ventricular premature complex Aberrant complex Nonspecific intraventricular conduction delay Confirmed by Bari Pfeiffer ([id number]) on 09/21/2024 5:36:35 AM  Radiology: [facility] Chest Portable 1 View Result Date: 09/21/2024 CLINICAL DATA:  Dyspnea EXAM: PORTABLE CHEST 1 VIEW COMPARISON:  09/11/2024 FINDINGS: Masslike consolidation within the right hilum representing post radiation changes better appreciated on CT examination of 09/11/2024, right-sided volume loss, and moderate right pleural effusion are unchanged. Known retrocardiac mass within the left lower lobe seen on prior CT examination is not well appreciated on the current exam. Left lung is clear. No pneumothorax. Cardiac size within limits. IMPRESSION: 1. Stable post radiation changes within the right hilum, right-sided volume loss, and moderate right pleural effusion. 2. Known left lower lobe mass is not well appreciated on the current exam. There in Electronically Signed   By: Dorethia Molt M.D.   On: 09/21/2024 04:01     .Critical Care  Performed by: Bari Pfeiffer FALCON, MD Authorized by: Bari Pfeiffer FALCON, MD   Critical care provider statement:    Critical care time (minutes):  40   Critical care was necessary to treat or prevent imminent or life-threatening deterioration of the  following conditions:  Respiratory failure   Critical care was time spent personally by me on the following activities:  Development of treatment plan with patient or surrogate, discussions with consultants, evaluation of patient's response to treatment, examination of patient, ordering and review of laboratory studies, ordering and review of radiographic studies, ordering and performing treatments and interventions, pulse oximetry, re-evaluation of patient's condition and review of old charts    Medications Ordered in the ED  dexamethasone  (DECADRON ) injection 10 mg (10 mg Intravenous Given 09/21/24 0351)  ipratropium-albuterol  (DUONEB) 0.5-2.5 (3) MG/3ML nebulizer solution 3 mL (3 mLs Nebulization Given 09/21/24 0355)    Clinical Course as of 09/21/24 0618  Thu Sep 21, 2024  0518 Patient states he feels better.  Will attempt to wean oxygen  however marginal O2 sats on the  facemask. [CH]  0539 Patient is resistant to admission.  Explained to him that he is requiring a facemask versus significantly more oxygen  than his home oxygen .  He is awake, alert, oriented.  I did convince him that he needs further treatments to keep his oxygen  levels elevated.  I do suspect that this is acute on chronic.  It is multifactorial including COPD and known lung cancer with persistent pleural effusion. [CH]    Clinical Course User Index [CH] Fizza Scales, Charmaine FALCON, MD                                 Medical Decision Making Amount and/or Complexity of Data Reviewed Labs: ordered. Radiology: ordered.  Risk Prescription drug management. Decision regarding hospitalization.   This patient presents to the ED for concern of shortness of breath, this involves an extensive number of treatment options, and is a complaint that carries with it a high risk of complications and morbidity.  I considered the following differential and admission for this acute, potentially life threatening condition.  The differential diagnosis  includes lack from oxygen , COPD exacerbation, pneumonia, pneumothorax, worsening pleural effusion secondary to cancer, acute viral illness, less likely ACS  MDM:    This is a 53 year old male with history of lung cancer and COPD on chronic home oxygen  and history of medical noncompliance who presents with shortness of breath.  Ran out of his oxygen  at home.  Was noted to be in the low 70s for EMS.  Tachypneic.  Denies chest pain.  Frequently has evaluated the emergency room for the same and will sign out AGAINST MEDICAL ADVICE.  Patient was given a continuous DuoNeb.  He was given steroids.  Chest x-ray shows no evidence of pneumothorax or pneumonia.  No evidence of leukocytosis and have low suspicion for pneumonia.  EKG shows no evidence of acute ischemia or arrhythmia.  Respiratory viral panel is pending.  After a DuoNeb and 1 continuous neb, patient states he feels much better and wants to go home.  However, we are unable to wean him off of facemask as his oxygen  levels dropped to the low 80s.  After long discussion, patient was finally agreeable to admission.  He had a CT PE study at the end of September that did not show any evidence of PE and showed progressive lung cancer findings and pleural effusion.  Do not feel that this needs to be repeated at this time.  Hypoxia is likely multifactorial related to known COPD, lung cancer, absence of oxygen  earlier in the day.  Will plan for admission for nebs and optimization.  (Labs, imaging, consults)  Labs: I Ordered, and personally interpreted labs.  The pertinent results include: CBC, BMP, respiratory viral pending panel  Imaging Studies ordered: I ordered imaging studies including chest x-ray I independently visualized and interpreted imaging. I agree with the radiologist interpretation  Additional history obtained from chart review.  External records from outside source obtained and reviewed including prior evaluations  Cardiac Monitoring: The  patient was maintained on a cardiac monitor.  If on the cardiac monitor, I personally viewed and interpreted the cardiac monitored which showed an underlying rhythm of: Sinus  Reevaluation: After the interventions noted above, I reevaluated the patient and found that they have :stayed the same  Social Determinants of Health:  lives independently  Disposition: Admit  Co morbidities that complicate the patient evaluation  Past Medical History:  Diagnosis Date  Asthma    Brain bleed (HCC)    DVT (deep venous thrombosis) (HCC) 02/19/2015   RLE   GERD (gastroesophageal reflux disease)    History of home oxygen  therapy    Hypertension    Seizures (HCC)    last episode 03/2013   Stroke (HCC)      Medicines Meds ordered this encounter  Medications   dexamethasone  (DECADRON ) injection 10 mg   ipratropium-albuterol  (DUONEB) 0.5-2.5 (3) MG/3ML nebulizer solution 3 mL    I have reviewed the patients home medicines and have made adjustments as needed  Problem List / ED Course: Problem List Items Addressed This Visit       Respiratory   Acute on chronic respiratory failure with hypoxia (HCC) - Primary   Other Visit Diagnoses       COPD exacerbation (HCC)       Relevant Medications   dexamethasone  (DECADRON ) injection 10 mg (Completed)   ipratropium-albuterol  (DUONEB) 0.5-2.5 (3) MG/3ML nebulizer solution 3 mL (Completed)     Malignant neoplasm of lung, unspecified laterality, unspecified part of lung (HCC)         Noncompliance                    Final diagnoses:  Acute on chronic respiratory failure with hypoxia (HCC)  COPD exacerbation (HCC)  Malignant neoplasm of lung, unspecified laterality, unspecified part of lung [facility])  Noncompliance    ED Discharge Orders     None          Bari, Charmaine FALCON, MD 09/21/24 [phone]

## 2024-09-21 NOTE — Discharge Summary (Signed)
  Physician Discharge Summary   Patient: Richard [name] MRN: [id number] DOB: 06/22/1971  Admit date:     09/11/2024  Discharge date: 09/12/2024  Discharge Physician: Jorie Blanch   PCP: Sim Emery CROME, MD   Patient left AGAINST MEDICAL ADVICE several hours after admission.  He remained in the ED and did not transfer to floor prior to leaving AMA.  Patient was counseled on high risk of mortality and given strict return precautions.  Please see H&P written by me dated 09/11/2024 for intended plan of care.  Discharge Diagnoses: Principal Problem:   Acute on chronic respiratory failure with hypoxia (HCC) Active Problems:   COPD with acute exacerbation (HCC)   Acute on chronic heart failure with preserved ejection fraction (HFpEF, >= 50%) (HCC)   History of DVT (deep vein thrombosis)   Seizure disorder (HCC)   Hypertension   Malignant neoplasm of overlapping sites of lung (HCC)   Pleural effusion on right  Hospital Course: [name] is a 52 y.o. male with medical history significant for stage IIIb NSCLC (s/p short course palliative radiation, concurrent chemoradiation, immunotherapy --now off treatment due to noncompliance), COPD, chronic hypoxic respiratory failure on 5 L O2 [place], chronic HFpEF (EF 55-60%), seizure disorder, HTN, history of TBI/ICH/left frontal encephalomalacia, history of DVT 2016 (completed 6 months anticoagulation), mood disorder, tobacco and cocaine use who is admitted with acute on chronic hypoxic respiratory failure due to COPD and CHF exacerbations.   Signed: Jorie Blanch, MD [facility] 09/21/2024

## 2024-09-21 NOTE — Procedures (Signed)
 Thoracentesis  Procedure Note  Richard [name]  [id number]  1971-10-15  Date:09/21/24  Time:4:51 PM   Provider Performing:Philo Kurtz   Procedure: Thoracentesis with imaging guidance (67444)  Indication(s) Pleural Effusion  Consent Risks of the procedure as well as the alternatives and risks of each were explained to the patient and/or caregiver.  Consent for the procedure was obtained and is signed in the bedside chart  Anesthesia Topical only with 1% lidocaine     Time Out Verified patient identification, verified procedure, site/side was marked, verified correct patient position, special equipment/implants available, medications/allergies/relevant history reviewed, required imaging and test results available.   Sterile Technique Maximal sterile technique including full sterile barrier drape, hand hygiene, sterile gown, sterile gloves, mask, hair covering, sterile ultrasound probe cover (if used).  Procedure Description Ultrasound was used to identify appropriate pleural anatomy for placement and overlying skin marked.  Area of drainage cleaned and draped in sterile fashion. Lidocaine  was used to anesthetize the skin and subcutaneous tissue.  1000 cc's of Yellowish appearing fluid was drained from the right pleural space. Catheter then removed and bandaid applied to site.   Complications/Tolerance None; patient tolerated the procedure well. Chest X-ray is ordered to confirm no post-procedural complication.   EBL Minimal   Specimen(s) Pleural fluid

## 2024-09-21 NOTE — Progress Notes (Signed)
  Echocardiogram 2D Echocardiogram has been performed.  Richard [name] 09/21/2024, 3:43 PM

## 2024-09-21 NOTE — H&P (Addendum)
 History and Physical    Patient: Richard [name] FMW:[id number] DOB: February 27, 1971 DOA: 09/21/2024 DOS: the patient was seen and examined on 09/21/2024 PCP: Richard Emery CROME, MD  Patient coming from: Home  Chief Complaint:  Chief Complaint  Patient presents with   Shortness of Breath   HPI: Richard [name] is a 53 y.o. male active smoker with medical history significant of HTN, COPD, chronic respiratory failure on 4 to 5 L of oxygen , NSCLC stage IIIb s/p palliative radiation, chemotherapy and immunotherapy stopped due to nonadherence to treatments, TBI, ICH, seizure disorder, aortic aneurysm, history of DVT, tobacco abuse, and history of substance abuse p/w AHRF.  Pt reports experiencing SOB yesterday. He reports that he was talking to a close male friend that was confiding in him regarding her current frustrations in life and wanting to hurt herself, and he thinks this upset him and caused him to experience SOB. He initially put on his [place] O2 at 5lpm and laid down with some relief; when he was unable to control his breathing despite using his inhalers and O2, he activated EMS. Of note, pt endorses ongoing active smoking pta.  In the ED, pt hypertensive, tachycardic and tachypneic w/ hypoxia (reqired 10L HFNC in ED). Labs unremarkable. BNP pending. CXR showed  stable post radiation changes within the right hilum, right-sided volume loss, and moderate right pleural effusion, as well as known LLL mass (not well visualized).EDP adminstered Decadron /Duoneb and requested medicine admission.    Review of Systems: As mentioned in the history of present illness. All other systems reviewed and are negative. Past Medical History:  Diagnosis Date   Asthma    Brain bleed (HCC)    DVT (deep venous thrombosis) (HCC) 02/19/2015   RLE   GERD (gastroesophageal reflux disease)    History of home oxygen  therapy    Hypertension    Seizures (HCC)    last episode 03/2013   Stroke [facility])    Past Surgical  History:  Procedure Laterality Date   BRONCHIAL BIOPSY  02/10/2022   Procedure: BRONCHIAL BIOPSIES;  Surgeon: Richard Harden GAILS, MD;  Location: [facility];  Service: Cardiopulmonary;;   BRONCHIAL BRUSHINGS  02/10/2022   Procedure: BRONCHIAL BRUSHINGS;  Surgeon: Richard Harden GAILS, MD;  Location: [facility];  Service: Cardiopulmonary;;   BRONCHIAL NEEDLE ASPIRATION BIOPSY  02/10/2022   Procedure: BRONCHIAL NEEDLE ASPIRATION BIOPSIES;  Surgeon: Richard Harden GAILS, MD;  Location: [facility];  Service: Cardiopulmonary;;   BRONCHIAL WASHINGS  02/10/2022   Procedure: BRONCHIAL WASHINGS;  Surgeon: Richard Harden GAILS, MD;  Location: [facility];  Service: Cardiopulmonary;;   ENDOBRONCHIAL ULTRASOUND Bilateral 02/10/2022   Procedure: ENDOBRONCHIAL ULTRASOUND;  Surgeon: Richard Harden GAILS, MD;  Location: [facility];  Service: Cardiopulmonary;  Laterality: Bilateral;   LEG SURGERY     VIDEO BRONCHOSCOPY  02/10/2022   Procedure: VIDEO BRONCHOSCOPY WITHOUT FLUORO;  Surgeon: Richard Harden GAILS, MD;  Location: [facility];  Service: Cardiopulmonary;;   Social History:  reports that he has been smoking cigarettes. He started smoking about 38 years ago. He has a 38.8 pack-year smoking history. He has never used smokeless tobacco. He reports current alcohol use. He reports that he does not currently use drugs after having used the following drugs: Cocaine.  Allergies  Allergen Reactions   Dilaudid  [Hydromorphone  Hcl] Nausea Only and Other (See Comments)    Bradycardia  Hyperthermia    Tape Itching and Dermatitis    Family History  Problem Relation Age of Onset   Cancer Father  Diabetes Mellitus II Sister     Prior to Admission medications   Medication Sig Start Date End Date Taking? Authorizing Provider  albuterol  (PROVENTIL ) (2.5 MG/3ML) 0.083% nebulizer solution Take 2.5 mg by nebulization 3 (three) times daily as needed for shortness of breath or wheezing. 05/11/24  Yes [provider]  albuterol   (VENTOLIN  HFA) 108 (90 Base) MCG/ACT inhaler Inhale 2 puffs into the lungs every 4 (four) hours as needed for wheezing or shortness of breath. 07/31/24  Yes Cottie Donnice PARAS, MD  aspirin  81 MG chewable tablet Chew 81 mg by mouth daily.   Yes [provider]  cloNIDine  (CATAPRES ) 0.1 MG tablet Take 0.1-0.2 mg by mouth See admin instructions. Take 2 tablets (0.2mg ) by mouth in the morning and evening (0900 and 1700) and take 1 tablet (0.1mg ) at bedtime.   Yes [provider]  furosemide  (LASIX ) 40 MG tablet Take 1 tablet (40 mg total) by mouth daily. Patient taking differently: Take 40 mg by mouth 2 (two) times daily. 05/22/24 10/21/24 Yes [place], Eric J, DO  levETIRAcetam  (KEPPRA ) 500 MG tablet Take 1 tablet (500 mg total) by mouth 2 (two) times daily. 01/17/23  Yes Norrine Sharper, MD  losartan  (COZAAR ) 100 MG tablet Take 100 mg by mouth daily. 12/31/22  Yes [provider]  sertraline  (ZOLOFT ) 25 MG tablet Take 25 mg by mouth daily. 07/23/23  Yes [provider]  SYMBICORT  160-4.5 MCG/ACT inhaler Inhale 2 puffs into the lungs in the morning and at bedtime. 08/25/24  Yes [provider]  TRELEGY ELLIPTA  100-62.5-25 MCG/ACT AEPB Inhale 1 puff into the lungs daily. 07/05/24  Yes [provider]    Physical Exam: Vitals:   09/21/24 0617 09/21/24 0624 09/21/24 0645 09/21/24 0700  BP:   (!) 165/121 (!) 150/123  Pulse: 100   (!) 102  Resp: (!) 26  (!) 21 (!) 23  Temp:      TempSrc:      SpO2: 100% 98%  90%  Height:       General: Alert, oriented x3, resting comfortably in no acute distress Respiratory: Diffuse wheezing and rhonchi throughout Cardiovascular: Regular rate and rhythm w/o m/r/g   Data Reviewed:  Lab Results  Component Value Date   WBC 6.1 09/21/2024   HGB 15.6 09/21/2024   HCT 48.0 09/21/2024   MCV 98.4 09/21/2024   PLT 239 09/21/2024   Lab Results  Component Value Date   GLUCOSE 88 09/21/2024   CALCIUM 8.4 (L) 09/21/2024    NA 140 09/21/2024   K 3.8 09/21/2024   CO2 21 (L) 09/21/2024   CL 107 09/21/2024   BUN 9 09/21/2024   CREATININE 0.75 09/21/2024   Lab Results  Component Value Date   ALT 13 06/15/2024   AST 18 06/15/2024   ALKPHOS 69 06/15/2024   BILITOT 0.9 06/15/2024   Lab Results  Component Value Date   INR 1.0 12/28/2022   INR 0.9 02/10/2022   INR 1.0 02/04/2022   Radiology: DG Chest Portable 1 View Result Date: 09/21/2024 CLINICAL DATA:  Dyspnea EXAM: PORTABLE CHEST 1 VIEW COMPARISON:  09/11/2024 FINDINGS: Masslike consolidation within the right hilum representing post radiation changes better appreciated on CT examination of 09/11/2024, right-sided volume loss, and moderate right pleural effusion are unchanged. Known retrocardiac mass within the left lower lobe seen on prior CT examination is not well appreciated on the current exam. Left lung is clear. No pneumothorax. Cardiac size within limits. IMPRESSION: 1. Stable post radiation changes within  the right hilum, right-sided volume loss, and moderate right pleural effusion. 2. Known left lower lobe mass is not well appreciated on the current exam. There in Electronically Signed   By: Dorethia Molt M.D.   On: 09/21/2024 04:01    Assessment and Plan: 43M h/o HTN, COPD, chronic respiratory failure on 4 to 5 L of oxygen , NSCLC stage IIIb s/p palliative radiation, chemotherapy and immunotherapy stopped due to nonadherence to treatments, TBI, ICH, seizure disorder, aortic aneurysm, history of DVT, tobacco abuse, and history of substance abuse p/w AHRF 2/2 AECOPD.  AHRF AECOPD -CCM consulted; apprec eval/recs -Continue IV lasix  80mg  BID for now (pta PO lasix  40mg  BID); goal net neg 1-2L/d; strict I/Os; daily standing weights; K>4/Mg>2 -Prednisone  40mg  daily for 4 doses starting 10/10(s/p IV decadron  10mg  x1 in ED) -Breztri  BID in place of pta Trelegy -Duonebs prn -Wean O2 as tolerated; consider ambulatory pulse ox prior to d/c -F/u Ddimer,  and CTA PE protocol to exclude PE  NSCLC -Consider re-engaging oncology to re-consider OP treatment if pt ammenable  HTN -PTA losartan  100mg  daily -HOLD pta clonidine  for now  Seizure disorder -PTA Keppra  500mg  BID  Mood disorder -PTA sertraline    Advance Care Planning:   Code Status: Full Code   Consults: N/A  Family Communication: N/A  Severity of Illness: The appropriate patient status for this patient is INPATIENT. Inpatient status is judged to be reasonable and necessary in order to provide the required intensity of service to ensure the patient's safety. The patient's presenting symptoms, physical exam findings, and initial radiographic and laboratory data in the context of their chronic comorbidities is felt to place them at high risk for further clinical deterioration. Furthermore, it is not anticipated that the patient will be medically stable for discharge from the hospital within 2 midnights of admission.   * I certify that at the point of admission it is my clinical judgment that the patient will require inpatient hospital care spanning beyond 2 midnights from the point of admission due to high intensity of service, high risk for further deterioration and high frequency of surveillance required.*   ------- I spent 60 minutes reviewing previous notes, at the bedside counseling/discussing the treatment plan, and performing clinical documentation.  Author: Marsha Ada, MD 09/21/2024 7:54 AM  For on call review [email or web address].

## 2024-09-21 NOTE — Hospital Course (Addendum)
  53 year old man phx of COPD, chronic hypoxic respiratory failure 4 to 5 L oxygen  at baseline, essential hypertension, non-small cell lung cancer status post palliative radiation, DVT, seizure disorder, and chronic smoking cigarette presented emergency department for progressively worsening shortness of breath and running out of oxygen  tank yesterday.  At presentation to ED pt desatted to 81% room air currently requiring high flow oxygen  10 L maintaining 98 to 100%.  Tachycardic and hypertensive.  Tachycardia and hypertension has been improved. EKG showing sinus tachycardia heart rate 135 and premature ventricular complex. Lab work, respiratory panel negative COVID RSV flu.  CBC unremarkable.  BMP showed low bicarb 21 otherwise unremarkable.  Chest x-ray stable postradiation changes of the right hilum.  In the ED patient received DuoNeb and Decadron ., right-sided volume loss, and moderate right pleural effusion. Known left lower lobe mass is not well appreciated on the current exam. There in  Hospitalist consulted to for further evaluation management of acute on chronic hypoxic respiratory failure and COPD exacerbation.

## 2024-09-21 NOTE — Consult Note (Addendum)
 NAME:  Richard [name], MRN:  [id number], DOB:  01-13-1971, LOS: 0 ADMISSION DATE:  09/21/2024, CONSULTATION DATE: 10/9 REFERRING MD: Dr. Georgina, CHIEF COMPLAINT: Short of breath  History of Present Illness:  53 year old male with past medical history as below, which is significant for hypertension, COPD, chronic hypoxemic respiratory failure on 4 to 5 L of oxygen , non-small cell lung cancer stage IIIb status post palliative radiation.  Chemotherapy and immunotherapy have been stopped due to nonadherence to treatments.  Also history of TBI, intracranial hemorrhage, and seizure disorder.  He remains an active smoker.  He has multiple ED presentations and admissions over the past few months all for respiratory issues such as COPD exacerbation and pulmonary edema.  Generally his course improves with diuresis and nebulized bronchodilators.  He presented most emergency department 10/8 with complaints of shortness of breath x 24 hours.  Oxygen  saturations in the 70s with EMS.  Upon arrival to the emergency department he was started on nonrebreather with improvement in oxygen  saturations, however was unable to be liberated.  Chest x-ray with evidence of moderate pleural effusion on the right.  He was treated with nebulized bronchodilator and a dose of IV Lasix .  He voided 3.5 L.  Despite these treatments he remained hypoxemic requiring 15 L salter high flow plus nonrebreather to maintain oxygen  saturations in the 90s.  PCCM was consulted.  Pertinent  Medical History   has a past medical history of Asthma, Brain bleed (HCC), DVT (deep venous thrombosis) (HCC) (02/19/2015), GERD (gastroesophageal reflux disease), History of home oxygen  therapy, Hypertension, Seizures (HCC), and Stroke (HCC).   Significant Hospital Events: Including procedures, antibiotic start and stop dates in addition to other pertinent events   10/9 admit for hypoxemic failure  Interim History / Subjective:    Objective    Blood  pressure (!) 168/117, pulse (!) 121, temperature 97.8 F (36.6 C), temperature source Axillary, resp. rate (!) 27, height 5' 9 (1.753 m), SpO2 94%.    FiO2 (%):  [91 %-100 %] 100 %   Intake/Output Summary (Last 24 hours) at 09/21/2024 1438 Last data filed at 09/21/2024 1230 Gross per 24 hour  Intake --  Output 3500 ml  Net -3500 ml   There were no vitals filed for this visit.  Examination: General: Thin adult male in NAD HENT: [place]/AT, Conjunctival hemorrhage on the right.  Lungs: Clear bilateral breath sounds. Diminished R base.  Cardiovascular: Tachy, regular, no MRG Abdomen: Soft, NT, ND Extremities: No acute deformity. 2-3+ pitting pretibial edema bilaterally.   Neuro: Alert, oriented, non-focal  Resolved problem list   Assessment and Plan   Acute on chronic respiratory failure with hypoxia: on 4L at home. Acute component is likely multifactorial. Probable COPD exacerbation, pulmonary edema, pleural effusion. Lung mass also looked larger on recent CT. - Admit to ICU for close monitoring - heated high flow nasal cannula - Keep sats > 92% - S/p 80 mg lasix . Reevaluate 10/10. - Tentative plans for thoracentesis on the right. Not a pleurx candidate with non-compliance history.  - Triple neb therapy - Prednisone  - LE dopplers to rule out VTE. Unable to tolerate CTA from a respiratory standpoint at this time.   HTN  - continue home losartan  and clonidine   Hyperkalemia: - on istat 6.8, repeat chemistry  Seizure disorder Hx TBI - Home keppra   MDD - Home sertraline .      Labs   CBC: Recent Labs  Lab 09/21/24 0342 09/21/24 0905 09/21/24 1427  WBC 6.1 7.4  --  NEUTROABS 4.8  --   --   HGB 15.6 17.1* 18.4*  HCT 48.0 53.8* 54.0*  MCV 98.4 100.7*  --   PLT 239 252  --     Basic Metabolic Panel: Recent Labs  Lab 09/21/24 0342 09/21/24 0905 09/21/24 1427  NA 140  --  135  K 3.8  --  6.7*  CL 107  --   --   CO2 21*  --   --   GLUCOSE 88  --   --   BUN 9   --   --   CREATININE 0.75 0.80  --   CALCIUM 8.4*  --   --    GFR: Estimated Creatinine Clearance: 124.2 mL/min (by C-G formula based on SCr of 0.8 mg/dL). Recent Labs  Lab 09/21/24 0342 09/21/24 0905  WBC 6.1 7.4    Liver Function Tests: No results for input(s): AST, ALT, ALKPHOS, BILITOT, PROT, ALBUMIN in the last 168 hours. No results for input(s): LIPASE, AMYLASE in the last 168 hours. No results for input(s): AMMONIA in the last 168 hours.  ABG    Component Value Date/Time   PHART 7.375 02/23/2015 0849   PCO2ART 53.9 (H) 02/23/2015 0849   PO2ART 102.0 (H) 02/23/2015 0849   HCO3 26.9 09/21/2024 1427   TCO2 28 09/21/2024 1427   ACIDBASEDEF 4.0 (H) 04/01/2024 1659   O2SAT 100 09/21/2024 1427     Coagulation Profile: No results for input(s): INR, PROTIME in the last 168 hours.  Cardiac Enzymes: No results for input(s): CKTOTAL, CKMB, CKMBINDEX, TROPONINI in the last 168 hours.  HbA1C: Hgb A1c MFr Bld  Date/Time Value Ref Range Status  05/19/2024 06:19 AM 6.0 (H) 4.8 - 5.6 % Final    Comment:    (NOTE) Diagnosis of Diabetes The following HbA1c ranges recommended by the American Diabetes Association (ADA) may be used as an aid in the diagnosis of diabetes mellitus.  Hemoglobin             Suggested A1C NGSP%              Diagnosis  <5.7                   Non Diabetic  5.7-6.4                Pre-Diabetic  >6.4                   Diabetic  <7.0                   Glycemic control for                       adults with diabetes.    10/13/2021 06:44 AM 6.1 (H) 4.8 - 5.6 % Final    Comment:    (NOTE) Pre diabetes:          5.7%-6.4%  Diabetes:              >6.4%  Glycemic control for   <7.0% adults with diabetes     CBG: No results for input(s): GLUCAP in the last 168 hours.  Review of Systems:   Bolds are positive  Constitutional: weight loss, gain, night sweats, Fevers, chills, fatigue .  HEENT: headaches, Sore  throat, sneezing, nasal congestion, post nasal drip, Difficulty swallowing, Tooth/dental problems, visual complaints visual changes, ear ache CV:  chest pain, radiates:,Orthopnea, PND, swelling in lower extremities, dizziness, palpitations, syncope.  GI  heartburn, indigestion, abdominal pain, nausea, vomiting, diarrhea, change in bowel habits, loss of appetite, bloody stools.  Resp: cough, productive: , hemoptysis, dyspnea, chest pain, pleuritic.  Skin: rash or itching or icterus GU: dysuria, change in color of urine, urgency or frequency. flank pain, hematuria  MS: joint pain or swelling. decreased range of motion  Psych: change in mood or affect. depression or anxiety.  Neuro: difficulty with speech, weakness, numbness, ataxia    Past Medical History:  He,  has a past medical history of Asthma, Brain bleed (HCC), DVT (deep venous thrombosis) (HCC) (02/19/2015), GERD (gastroesophageal reflux disease), History of home oxygen  therapy, Hypertension, Seizures (HCC), and Stroke (HCC).   Surgical History:   Past Surgical History:  Procedure Laterality Date   BRONCHIAL BIOPSY  02/10/2022   Procedure: BRONCHIAL BIOPSIES;  Surgeon: Jude Harden GAILS, MD;  Location: [facility];  Service: Cardiopulmonary;;   BRONCHIAL BRUSHINGS  02/10/2022   Procedure: BRONCHIAL BRUSHINGS;  Surgeon: Jude Harden GAILS, MD;  Location: [facility];  Service: Cardiopulmonary;;   BRONCHIAL NEEDLE ASPIRATION BIOPSY  02/10/2022   Procedure: BRONCHIAL NEEDLE ASPIRATION BIOPSIES;  Surgeon: Jude Harden GAILS, MD;  Location: [facility];  Service: Cardiopulmonary;;   BRONCHIAL WASHINGS  02/10/2022   Procedure: BRONCHIAL WASHINGS;  Surgeon: Jude Harden GAILS, MD;  Location: [facility];  Service: Cardiopulmonary;;   ENDOBRONCHIAL ULTRASOUND Bilateral 02/10/2022   Procedure: ENDOBRONCHIAL ULTRASOUND;  Surgeon: Jude Harden GAILS, MD;  Location: [facility];  Service: Cardiopulmonary;  Laterality: Bilateral;   LEG SURGERY     VIDEO  BRONCHOSCOPY  02/10/2022   Procedure: VIDEO BRONCHOSCOPY WITHOUT FLUORO;  Surgeon: Jude Harden GAILS, MD;  Location: [facility];  Service: Cardiopulmonary;;     Social History:   reports that he has been smoking cigarettes. He started smoking about 38 years ago. He has a 38.8 pack-year smoking history. He has never used smokeless tobacco. He reports current alcohol use. He reports that he does not currently use drugs after having used the following drugs: Cocaine.   Family History:  His family history includes Cancer in his father; Diabetes Mellitus II in his sister.   Allergies Allergies  Allergen Reactions   Dilaudid  [Hydromorphone  Hcl] Nausea Only and Other (See Comments)    Bradycardia  Hyperthermia    Tape Itching and Dermatitis     Home Medications  Prior to Admission medications   Medication Sig Start Date End Date Taking? Authorizing Provider  albuterol  (PROVENTIL ) (2.5 MG/3ML) 0.083% nebulizer solution Take 2.5 mg by nebulization 3 (three) times daily as needed for shortness of breath or wheezing. 05/11/24  Yes [provider]  albuterol  (VENTOLIN  HFA) 108 (90 Base) MCG/ACT inhaler Inhale 2 puffs into the lungs every 4 (four) hours as needed for wheezing or shortness of breath. 07/31/24  Yes Cottie Donnice PARAS, MD  aspirin  81 MG chewable tablet Chew 81 mg by mouth daily.   Yes [provider]  cloNIDine  (CATAPRES ) 0.1 MG tablet Take 0.1-0.2 mg by mouth See admin instructions. Take 2 tablets (0.2mg ) by mouth in the morning and evening (0900 and 1700) and take 1 tablet (0.1mg ) at bedtime.   Yes [provider]  furosemide  (LASIX ) 40 MG tablet Take 1 tablet (40 mg total) by mouth daily. Patient taking differently: Take 40 mg by mouth 2 (two) times daily. 05/22/24 10/21/24 Yes [place], Eric J, DO  levETIRAcetam  (KEPPRA ) 500 MG tablet Take 1 tablet (500 mg total) by mouth 2 (two) times daily. 01/17/23  Yes Norrine Sharper, MD  losartan  (COZAAR )  100 MG tablet Take  100 mg by mouth daily. 12/31/22  Yes [provider]  sertraline  (ZOLOFT ) 25 MG tablet Take 25 mg by mouth daily. 07/23/23  Yes [provider]  SYMBICORT  160-4.5 MCG/ACT inhaler Inhale 2 puffs into the lungs in the morning and at bedtime. 08/25/24  Yes [provider]  TRELEGY ELLIPTA  100-62.5-25 MCG/ACT AEPB Inhale 1 puff into the lungs daily. 07/05/24  Yes [provider]     Critical care time: 39 minutes     Deward Eastern, AGACNP-BC [facility]  See Amion for personal pager PCCM on call pager [phone] until 7pm. Please call Elink 7p-7a. [phone]  09/21/2024 3:25 PM

## 2024-09-21 NOTE — ED Triage Notes (Signed)
 PT BIB EMS from home. C/o SHOB, 71% RA. Pt wears 5 L Herman baseline.  EMS reports home 02 tank was empty and SHOB began yesterday. Hx copd, asthma  186/130, HR 104, cbg 98

## 2024-09-22 ENCOUNTER — Inpatient Hospital Stay (HOSPITAL_COMMUNITY)

## 2024-09-22 DIAGNOSIS — M7989 Other specified soft tissue disorders: Secondary | ICD-10-CM

## 2024-09-22 DIAGNOSIS — I5030 Unspecified diastolic (congestive) heart failure: Secondary | ICD-10-CM

## 2024-09-22 LAB — BASIC METABOLIC PANEL WITH GFR
Anion gap: 12 (ref 5–15)
BUN: 10 mg/dL (ref 6–20)
CO2: 26 mmol/L (ref 22–32)
Calcium: 8.6 mg/dL — ABNORMAL LOW (ref 8.9–10.3)
Chloride: 101 mmol/L (ref 98–111)
Creatinine, Ser: 0.71 mg/dL (ref 0.61–1.24)
GFR, Estimated: >60 mL/min (ref >60)
Glucose, Bld: 112 mg/dL — ABNORMAL HIGH (ref 70–99)
Potassium: 4.3 mmol/L (ref 3.5–5.1)
Sodium: 139 mmol/L (ref 135–145)

## 2024-09-22 LAB — CBC WITH DIFFERENTIAL/PLATELET
Abs Immature Granulocytes: 0.03 K/uL (ref 0.00–0.07)
Basophils Absolute: 0 K/uL (ref 0.0–0.1)
Basophils Relative: 0 %
Eosinophils Absolute: 0 K/uL (ref 0.0–0.5)
Eosinophils Relative: 0 %
HCT: 44.3 % (ref 39.0–52.0)
Hemoglobin: 14.5 g/dL (ref 13.0–17.0)
Immature Granulocytes: 0 %
Lymphocytes Relative: 4 %
Lymphs Abs: 0.3 K/uL — ABNORMAL LOW (ref 0.7–4.0)
MCH: 31.5 pg (ref 26.0–34.0)
MCHC: 32.7 g/dL (ref 30.0–36.0)
MCV: 96.3 fL (ref 80.0–100.0)
Monocytes Absolute: 1.1 K/uL — ABNORMAL HIGH (ref 0.1–1.0)
Monocytes Relative: 13 %
Neutro Abs: 7.2 K/uL (ref 1.7–7.7)
Neutrophils Relative %: 83 %
Platelets: 227 K/uL (ref 150–400)
RBC: 4.6 MIL/uL (ref 4.22–5.81)
RDW: 15.3 % (ref 11.5–15.5)
WBC: 8.6 K/uL (ref 4.0–10.5)
nRBC: 0 % (ref 0.0–0.2)

## 2024-09-22 MED ORDER — CLONIDINE HCL 0.1 MG PO TABS
0.1000 mg | ORAL_TABLET | Freq: Three times a day (TID) | ORAL | Status: DC
  Administered 2024-09-22 – 2024-09-23 (×4): 0.1 mg via ORAL
  Filled 2024-09-22 (×4): qty 1

## 2024-09-22 MED ORDER — FUROSEMIDE 10 MG/ML IJ SOLN
80.0000 mg | Freq: Once | INTRAMUSCULAR | Status: AC
  Administered 2024-09-22: 80 mg via INTRAVENOUS
  Filled 2024-09-22: qty 8

## 2024-09-22 NOTE — Evaluation (Signed)
 RT Evaluate and Treat Note  09/22/2024   Breathing is (select one): Same as normal    The following was found on auscultation (select multiple):  Bilateral Breath Sounds: Diminished (09/22/24 0800)  R Upper  Breath Sounds: Clear;Diminished (09/22/24 0253) L Upper Breath Sounds: Diminished;Clear (09/22/24 0253) R Lower Breath Sounds: Diminished (09/22/24 0253) L Lower Breath Sounds: Diminished (09/22/24 0253)    Cough Assessment: Cough: Non-productive (09/22/24 0253)    Most Recent Chest Xray:... (DG CHEST PORT 1 VIEW Result Date: 09/21/2024 CLINICAL DATA:  Acute and chronic respiratory failure. EXAM: PORTABLE CHEST 1 VIEW COMPARISON:  09/21/2024 FINDINGS: Shallow inspiration. Cardiac enlargement. Right perihilar mass with scarring in the right mid lung likely representing post treatment changes with history of lung cancer. Appearances are similar to prior studies. Mild interstitial changes in the left lung base, likely fibrosis, unchanged. No pleural effusion or pneumothorax. Tortuous aorta. IMPRESSION: Unchanged right perihilar mass and scarring consistent with post treatment changes. Interstitial changes in the left base, likely fibrosis also unchanged. Cardiac enlargement. Electronically Signed   By: Elsie Gravely M.D.   On: 09/21/2024 20:36      The following medications and/or interventions were ordered/changed/discontinued as part of the Respiratory Treatment protocol:   Medication Changes: None   Airway Clearance Changes:  None   Oxygen  Therapy Changes: None

## 2024-09-22 NOTE — Progress Notes (Signed)
 NAME:  Richard [name], MRN:  [id number], DOB:  07-30-1971, LOS: 1 ADMISSION DATE:  09/21/2024, CONSULTATION DATE: 10/9 REFERRING MD: Dr. Georgina, CHIEF COMPLAINT: Short of breath  History of Present Illness:  53 year old male with past medical history as below, which is significant for hypertension, COPD, chronic hypoxemic respiratory failure on 4 to 5 L of oxygen , non-small cell lung cancer stage IIIb status post palliative radiation.  Chemotherapy and immunotherapy have been stopped due to nonadherence to treatments.  Also history of TBI, intracranial hemorrhage, and seizure disorder.  He remains an active smoker.  He has multiple ED presentations and admissions over the past few months all for respiratory issues such as COPD exacerbation and pulmonary edema.  Generally his course improves with diuresis and nebulized bronchodilators.  He presented most emergency department 10/8 with complaints of shortness of breath x 24 hours.  Oxygen  saturations in the 70s with EMS.  Upon arrival to the emergency department he was started on nonrebreather with improvement in oxygen  saturations, however was unable to be liberated.  Chest x-ray with evidence of moderate pleural effusion on the right.  He was treated with nebulized bronchodilator and a dose of IV Lasix .  He voided 3.5 L.  Despite these treatments he remained hypoxemic requiring 15 L salter high flow plus nonrebreather to maintain oxygen  saturations in the 90s.  PCCM was consulted.  Pertinent  Medical History   has a past medical history of Asthma, Brain bleed (HCC), DVT (deep venous thrombosis) (HCC) (02/19/2015), GERD (gastroesophageal reflux disease), History of home oxygen  therapy, Hypertension, Seizures (HCC), and Stroke (HCC).  Significant Hospital Events: Including procedures, antibiotic start and stop dates in addition to other pertinent events   10/9 admit for hypoxemic failure 10/10 no acute issues overnight diuresed 4.5 L with Lasix  in the  last 24 hours.  Remains on 40 L high flow  Interim History / Subjective:  Seen sitting up in bed with no acute complaints asking when he will be discharged  Objective    Blood pressure (!) 143/117, pulse 88, temperature 98.8 F (37.1 C), temperature source Axillary, resp. rate 19, height 5' 9 (1.753 m), weight 87.9 kg, SpO2 92%.    FiO2 (%):  [65 %-100 %] 65 %   Intake/Output Summary (Last 24 hours) at 09/22/2024 0825 Last data filed at 09/22/2024 0400 Gross per 24 hour  Intake --  Output 4500 ml  Net -4500 ml   Filed Weights   09/21/24 1614  Weight: 87.9 kg    Examination: General: Thin ill-appearing middle-aged sitting up in bed in no acute distress HEENT: [place]/AT, MM pink/moist, PERRL,  Neuro: Alert oriented x 3, nonfocal CV: s1s2 regular rate and rhythm, no murmur, rubs, or gallops,  PULM: Slight bilateral expiratory wheeze, on 40 L heated high flow nasal cannula, no increased work of breathing, no added breath sounds GI: soft, bowel sounds active in all 4 quadrants, non-tender, non-distended, tolerating oral diet Extremities: warm/dry, 2+ pitting lower extremity edema  Skin: no rashes or lesions  Resolved problem list  Hyperkalemia  Assessment and Plan   Acute on chronic respiratory failure with hypoxia Right pleural effusion -On 4L at home. Acute component is likely multifactorial. Probable COPD exacerbation, pulmonary edema, pleural effusion. Lung mass also looked larger on recent CT. -Underwent bedside thoracentesis with a liter removed > exudative by lights criteria P: Continue HHFNC for sat goal greater than 92 Repeat aggressive diuretics today given pitting edema on exam Continue triple neb therapy Prednisone  for potential COPD  exacerbation Lower extremity Doppler pending  Diastolic congestive heart failure - Echo 10/9 with EF 70 to 75% with hyperdynamic function, no WMA, moderate concentric left ventricular hypertrophy and grade 1 diastolic  dysfunction P: Continuous telemetry  Continue statin Heart health diet with sodium restriction when able  Strict intake and output  Daily weight to assess volume status Daily assessment for need to diurese  Closely monitor renal function and electrolytes   HTN  P: Continue home losartan   Restart home clonidine   Seizure disorder Hx TBI P: Continue home Keppra  Seizure precautions Precautions  MDD P: Continue home sertraline   Critical care time:   CRITICAL CARE Performed by: Tayveon Lombardo D. Harris   Total critical care time: 38 minutes  Critical care time was exclusive of separately billable procedures and treating other patients.  Critical care was necessary to treat or prevent imminent or life-threatening deterioration.  Critical care was time spent personally by me on the following activities: development of treatment plan with patient and/or surrogate as well as nursing, discussions with consultants, evaluation of patient's response to treatment, examination of patient, obtaining history from patient or surrogate, ordering and performing treatments and interventions, ordering and review of laboratory studies, ordering and review of radiographic studies, pulse oximetry and re-evaluation of patient's condition.  Stefan Karen D. Harris, NP-C [facility] Personal contact information can be found on Amion  If no contact or response made please call [phone] 09/22/2024, 8:28 AM

## 2024-09-22 NOTE — Progress Notes (Signed)
 VASCULAR LAB    Bilateral lower extremity venous duplex has been performed.  See CV proc for preliminary results.   Klye Besecker, RVT 09/22/2024, 11:10 AM

## 2024-09-22 NOTE — Progress Notes (Signed)
 eLink Physician-Brief Progress Note Patient Name: Richard [name] DOB: [birth year] MRN: [id number]   Date of Service  09/22/2024  HPI/Events of Note  ST elevations seen on the monitor, EKG reviewed consistent with repolarization defects, moderately peaked T's.  Last K4.7  eICU Interventions  Reset monitor alarms, no other intervention for now     Intervention Category Intermediate Interventions: Arrhythmia - evaluation and management  Dracen Reigle 09/22/2024, 2:38 AM

## 2024-09-22 NOTE — Plan of Care (Signed)
  Problem: Clinical Measurements: Goal: Ability to maintain clinical measurements within normal limits will improve Outcome: Progressing   Problem: Activity: Goal: Risk for activity intolerance will decrease Outcome: Progressing   Problem: Nutrition: Goal: Adequate nutrition will be maintained Outcome: Progressing   Problem: Coping: Goal: Level of anxiety will decrease Outcome: Progressing   

## 2024-09-23 DIAGNOSIS — J9601 Acute respiratory failure with hypoxia: Secondary | ICD-10-CM | POA: Diagnosis not present

## 2024-09-23 MED ORDER — SPIRONOLACTONE 25 MG PO TABS
25.0000 mg | ORAL_TABLET | Freq: Every day | ORAL | Status: DC
  Administered 2024-09-23 – 2024-09-24 (×2): 25 mg via ORAL
  Filled 2024-09-23 (×2): qty 1

## 2024-09-23 MED ORDER — FUROSEMIDE 10 MG/ML IJ SOLN
40.0000 mg | Freq: Two times a day (BID) | INTRAMUSCULAR | Status: AC
Start: 2024-09-23 — End: 2024-09-23
  Administered 2024-09-23 (×2): 40 mg via INTRAVENOUS
  Filled 2024-09-23 (×2): qty 4

## 2024-09-23 MED ORDER — CLONIDINE HCL 0.1 MG PO TABS
0.1000 mg | ORAL_TABLET | Freq: Every day | ORAL | Status: DC
  Administered 2024-09-24: 0.1 mg via ORAL
  Filled 2024-09-23: qty 1

## 2024-09-23 NOTE — Plan of Care (Signed)

## 2024-09-23 NOTE — Progress Notes (Signed)
   09/23/24 0555  Vitals  Temp 98.4 F (36.9 C)  Temp Source Oral  BP (!) 142/101  MAP (mmHg) 114  BP Location Left Arm  BP Method Automatic  Patient Position (if appropriate) Lying  Pulse Rate Source Monitor  Resp 20  Level of Consciousness  Level of Consciousness Alert  MEWS COLOR  MEWS Score Color Green  Oxygen  Therapy  SpO2 92 %  O2 Device HHFNC  MEWS Score  MEWS Temp 0  MEWS Systolic 0  MEWS Pulse 0  MEWS RR 0  MEWS LOC 0  MEWS Score 0   Received patient from 53M via bed, alert and oriented X 4, placed on cardiac monitor, CCMD made aware. Oriented to room, call bell placed within reach.

## 2024-09-23 NOTE — Progress Notes (Signed)
 PROGRESS NOTE    Richard [name]  FMW:[id number] DOB: 02/01/1971 DOA: 09/21/2024 PCP: Sim Emery CROME, MD   53 y.o. male active smoker with medical history significant of HTN, COPD, chronic respiratory failure on 4 to 5 L of oxygen , NSCLC stage IIIb s/p palliative radiation, chemotherapy and immunotherapy stopped due to nonadherence to treatments, TBI, ICH, seizure disorder, aortic aneurysm, history of DVT, tobacco abuse, and history of substance abuse presented to the ED with worsening dyspnea on exertion.  In the ED he was profoundly hypoxic requiring 10 to 15 L high flow, chest x-ray noted postradiation changes, moderate right pleural effusion and left lower lobe mass  Subjective: -Breathing better, ready to go home, has a hard time accepting the fact that he is requiring 30 L high flow  Assessment and Plan:  Acute on chronic respiratory failure with hypoxia Right pleural effusion -On 4L at home.  - Respiratory failure is multifactorial, CHF and pleural effusion contributing, in addition to COPD, lung mass and radiation fibrosis  -sp thoracentesis-1 L drained, continue IV Lasix  today - Continue DuoNebs, pulmonary toilet - On 30 L heated high flow at this time, attempt to wean O2  COPD - Continue triple neb therapy, on prednisone  taper from ICU   Acute on chronic diastolic CHF - Echo 10/9 with EF 70 to 75% with hyperdynamic function, no WMA, moderate concentric left ventricular hypertrophy and grade 1 diastolic dysfunction, normal RV -Continue IV Lasix  today, losartan , add Aldactone -Poor candidate for SGLT2i with his cognitive issues  History of stage IIIb lung cancer -Prior history of radiation and some brief chemo/immunotherapy -Not currently undergoing any treatment, compliance has been an issue seen by Dr. Gatha -Repeat imaging with worsening size of malignancy   HTN  -Cut down clonidine , continue losartan    Seizure disorder Hx TBI -Continue home Keppra  Seizure  precautions   MDD -Continue home sertraline     DVT prophylaxis: Lovenox  Code Status: Full code Family Communication: Disposition Plan:   Consultants:    Procedures:   Antimicrobials:    Objective: Vitals:   09/23/24 0700 09/23/24 0739 09/23/24 0751 09/23/24 0900  BP:  (!) 136/97    Pulse:  (!) 115  93  Resp:  16  (!) 23  Temp:  (!) 97.5 F (36.4 C)    TempSrc:  Oral    SpO2: 90% 97% 91% (!) 88%  Weight:      Height:        Intake/Output Summary (Last 24 hours) at 09/23/2024 1142 Last data filed at 09/23/2024 0950 Gross per 24 hour  Intake 240 ml  Output 4330 ml  Net -4090 ml   Filed Weights   09/21/24 1614 09/23/24 0544  Weight: 87.9 kg 85.7 kg    Examination:  General exam: Appears calm and comfortable  Respiratory system: Clear to auscultation Cardiovascular system: S1 & S2 heard, RRR.  Abd: nondistended, soft and nontender.Normal bowel sounds heard. Central nervous system: Alert and oriented. No focal neurological deficits. Extremities: no edema Skin: No rashes Psychiatry:  Mood & affect appropriate.     Data Reviewed:   CBC: Recent Labs  Lab 09/21/24 0342 09/21/24 0905 09/21/24 1427 09/22/24 0349  WBC 6.1 7.4  --  8.6  NEUTROABS 4.8  --   --  7.2  HGB 15.6 17.1* 18.4* 14.5  HCT 48.0 53.8* 54.0* 44.3  MCV 98.4 100.7*  --  96.3  PLT 239 252  --  227   Basic Metabolic Panel: Recent Labs  Lab 09/21/24 0342  09/21/24 0905 09/21/24 1427 09/21/24 1613 09/22/24 0349  NA 140  --  135 137 139  K 3.8  --  6.7* 4.7 4.3  CL 107  --   --  102 101  CO2 21*  --   --  20* 26  GLUCOSE 88  --   --  131* 112*  BUN 9  --   --  15 10  CREATININE 0.75 0.80  --  0.99 0.71  CALCIUM 8.4*  --   --  8.7* 8.6*   GFR: Estimated Creatinine Clearance: 114.5 mL/min (by C-G formula based on SCr of 0.71 mg/dL). Liver Function Tests: Recent Labs  Lab 09/21/24 1613  PROT 7.0   No results for input(s): LIPASE, AMYLASE in the last 168 hours. No  results for input(s): AMMONIA in the last 168 hours. Coagulation Profile: No results for input(s): INR, PROTIME in the last 168 hours. Cardiac Enzymes: No results for input(s): CKTOTAL, CKMB, CKMBINDEX, TROPONINI in the last 168 hours. BNP (last 3 results) No results for input(s): PROBNP in the last 8760 hours. HbA1C: No results for input(s): HGBA1C in the last 72 hours. CBG: Recent Labs  Lab 09/21/24 1608  GLUCAP 146*   Lipid Profile: No results for input(s): CHOL, HDL, LDLCALC, TRIG, CHOLHDL, LDLDIRECT in the last 72 hours. Thyroid  Function Tests: No results for input(s): TSH, T4TOTAL, FREET4, T3FREE, THYROIDAB in the last 72 hours. Anemia Panel: No results for input(s): VITAMINB12, FOLATE, FERRITIN, TIBC, IRON, RETICCTPCT in the last 72 hours. Urine analysis:    Component Value Date/Time   COLORURINE YELLOW 08/22/2024 0843   APPEARANCEUR CLEAR 08/22/2024 0843   LABSPEC 1.015 08/22/2024 0843   PHURINE 5.0 08/22/2024 0843   GLUCOSEU NEGATIVE 08/22/2024 0843   HGBUR NEGATIVE 08/22/2024 0843   HGBUR negative 01/16/2011 1530   BILIRUBINUR NEGATIVE 08/22/2024 0843   KETONESUR NEGATIVE 08/22/2024 0843   PROTEINUR 100 (A) 08/22/2024 0843   UROBILINOGEN 1.0 07/15/2015 2137   NITRITE NEGATIVE 08/22/2024 0843   LEUKOCYTESUR NEGATIVE 08/22/2024 0843   Sepsis Labs: @LABRCNTIP (procalcitonin:4,lacticidven:4)  ) Recent Results (from the past 240 hours)  Resp panel by RT-PCR (RSV, Flu A&B, Covid) Anterior Nasal Swab     Status: None   Collection Time: 09/21/24  3:43 AM   Specimen: Anterior Nasal Swab  Result Value Ref Range Status   SARS Coronavirus 2 by RT PCR NEGATIVE NEGATIVE Final   Influenza A by PCR NEGATIVE NEGATIVE Final   Influenza B by PCR NEGATIVE NEGATIVE Final    Comment: (NOTE) The Xpert Xpress SARS-CoV-2/FLU/RSV plus assay is intended as an aid in the diagnosis of influenza from Nasopharyngeal swab specimens  and should not be used as a sole basis for treatment. Nasal washings and aspirates are unacceptable for Xpert Xpress SARS-CoV-2/FLU/RSV testing.  Fact Sheet for Patients: BloggerCourse.com  Fact Sheet for Healthcare Providers: SeriousBroker.it  This test is not yet approved or cleared by the [place]  FDA and has been authorized for detection and/or diagnosis of SARS-CoV-2 by FDA under an Emergency Use Authorization (EUA). This EUA will remain in effect (meaning this test can be used) for the duration of the COVID-19 declaration under Section 564(b)(1) of the Act, 21 U.S.C. section 360bbb-3(b)(1), unless the authorization is terminated or revoked.     Resp Syncytial Virus by PCR NEGATIVE NEGATIVE Final    Comment: (NOTE) Fact Sheet for Patients: BloggerCourse.com  Fact Sheet for Healthcare Providers: SeriousBroker.it  This test is not yet approved or cleared by the [place]  FDA and  has been authorized for detection and/or diagnosis of SARS-CoV-2 by FDA under an Emergency Use Authorization (EUA). This EUA will remain in effect (meaning this test can be used) for the duration of the COVID-19 declaration under Section 564(b)(1) of the Act, 21 U.S.C. section 360bbb-3(b)(1), unless the authorization is terminated or revoked.  Performed at [facility], [address] [place], [place] [address]      Radiology Studies: VAS US  LOWER EXTREMITY VENOUS (DVT) Result Date: 09/22/2024  Lower Venous DVT Study Patient Name:  AADYN BUCHHEIT  Date of Exam:   09/22/2024 Medical Rec #: [id number]     Accession #:    [id number] Date of Birth: October 14, 1971     Patient Gender: M Patient Age:   1 years Exam Location:  [facility] Procedure:      VAS US  LOWER EXTREMITY VENOUS (DVT) Referring Phys: FONDA SHARPS  --------------------------------------------------------------------------------  Indications: Pitting edema. Other Indications: Acute on chronic hypoxic respiratory failure (on 4-5 liters                    of oxygen  at home). Risk Factors: Stage IIIb non small cell lung cancer, status post palliative radiation. Chemotherapy and immunotherapy have been ceased. Limitations: Poor ultrasound/tissue interface and Multiple interruptions secondary to patient being given Lasix  immediately prior to study. Comparison       Prior negative bilateral LE venous studies done 08/22/24 and Study:           07/13/24 Performing Technologist: Alberta Lis RVS  Examination Guidelines: A complete evaluation includes B-mode imaging, spectral Doppler, color Doppler, and power Doppler as needed of all accessible portions of each vessel. Bilateral testing is considered an integral part of a complete examination. Limited examinations for reoccurring indications may be performed as noted. The reflux portion of the exam is performed with the patient in reverse Trendelenburg.  +---------+---------------+---------+-----------+----------+--------------+ RIGHT    CompressibilityPhasicitySpontaneityPropertiesThrombus Aging +---------+---------------+---------+-----------+----------+--------------+ CFV      Full           Yes      No                                  +---------+---------------+---------+-----------+----------+--------------+ SFJ      Full                                                        +---------+---------------+---------+-----------+----------+--------------+ FV Prox  Full           Yes      No                                  +---------+---------------+---------+-----------+----------+--------------+ FV Mid   Full                                                        +---------+---------------+---------+-----------+----------+--------------+ FV DistalFull                                                         +---------+---------------+---------+-----------+----------+--------------+  PFV      Full                                                        +---------+---------------+---------+-----------+----------+--------------+ POP      Full           Yes      No                                  +---------+---------------+---------+-----------+----------+--------------+ PTV      Full                                                        +---------+---------------+---------+-----------+----------+--------------+ PERO     Full                                                        +---------+---------------+---------+-----------+----------+--------------+   +---------+---------------+---------+-----------+----------+--------------+ LEFT     CompressibilityPhasicitySpontaneityPropertiesThrombus Aging +---------+---------------+---------+-----------+----------+--------------+ CFV      Full           Yes      No                                  +---------+---------------+---------+-----------+----------+--------------+ SFJ      Full                                                        +---------+---------------+---------+-----------+----------+--------------+ FV Prox  Full                                                        +---------+---------------+---------+-----------+----------+--------------+ FV Mid   Full                                                        +---------+---------------+---------+-----------+----------+--------------+ FV DistalFull                                                        +---------+---------------+---------+-----------+----------+--------------+ PFV      Full                                                        +---------+---------------+---------+-----------+----------+--------------+  POP      Full           Yes      No                                   +---------+---------------+---------+-----------+----------+--------------+ PTV      Full                                                        +---------+---------------+---------+-----------+----------+--------------+ PERO     Full                                                        +---------+---------------+---------+-----------+----------+--------------+     Summary: RIGHT: - No evidence of deep vein thrombosis in the lower extremity. No indirect evidence of obstruction proximal to the inguinal ligament.  - No cystic structure found in the popliteal fossa. Subcutaneous edema noted. Pulsatile waveforms throughout.  LEFT: - No evidence of deep vein thrombosis in the lower extremity. No indirect evidence of obstruction proximal to the inguinal ligament.  - No cystic structure found in the popliteal fossa. - Ultrasound characteristics of enlarged lymph nodes noted in the groin. Subcutaneous edema noted. Pulsatile waveforms throughout.  *See table(s) above for measurements and observations. Electronically signed by Gaile New MD on 09/22/2024 at 9:58:12 PM.    Final    DG CHEST PORT 1 VIEW Result Date: 09/21/2024 CLINICAL DATA:  Acute and chronic respiratory failure. EXAM: PORTABLE CHEST 1 VIEW COMPARISON:  09/21/2024 FINDINGS: Shallow inspiration. Cardiac enlargement. Right perihilar mass with scarring in the right mid lung likely representing post treatment changes with history of lung cancer. Appearances are similar to prior studies. Mild interstitial changes in the left lung base, likely fibrosis, unchanged. No pleural effusion or pneumothorax. Tortuous aorta. IMPRESSION: Unchanged right perihilar mass and scarring consistent with post treatment changes. Interstitial changes in the left base, likely fibrosis also unchanged. Cardiac enlargement. Electronically Signed   By: Elsie Gravely M.D.   On: 09/21/2024 20:36   ECHOCARDIOGRAM COMPLETE BUBBLE STUDY Result Date: 09/21/2024     ECHOCARDIOGRAM REPORT   Patient Name:   Richard [name] Date of Exam: 09/21/2024 Medical Rec #:  [id number]    Height:       69.0 in Accession #:    [id number]   Weight:       224.9 lb Date of Birth:  10-03-1971    BSA:          2.172 m Patient Age:    53 years     BP:           168/117 mmHg Patient Gender: M            HR:           113 bpm. Exam Location:  Inpatient Procedure: 2D Echo and Saline Contrast Bubble Study (Both Spectral and Color            Flow Doppler were utilized during procedure). STAT ECHO Indications:    dyspnea  History:        Patient has prior history of  Echocardiogram examinations, most                 recent 05/19/2024. COPD and lung cancer, Signs/Symptoms:Shortness                 of Breath and Edema; Risk Factors:Hypertension and Current                 Smoker.  Sonographer:    Tinnie Barefoot RDCS Referring Phys: [id number] MARSHA ADA IMPRESSIONS  1. Left ventricular ejection fraction, by estimation, is 70 to 75%. The left ventricle has hyperdynamic function. The left ventricle has no regional wall motion abnormalities. There is moderate concentric left ventricular hypertrophy. Left ventricular diastolic parameters are consistent with Grade I diastolic dysfunction (impaired relaxation). There is the interventricular septum is flattened in systole, consistent with right ventricular pressure overload.  2. Right ventricular systolic function is severely reduced. The right ventricular size is severely enlarged.  3. Right atrial size was mildly dilated.  4. A small pericardial effusion is present. The pericardial effusion is surrounding the apex.  5. The mitral valve is normal in structure. Trivial mitral valve regurgitation. No evidence of mitral stenosis.  6. The aortic valve is normal in structure. Aortic valve regurgitation is trivial. No aortic stenosis is present.  7. Aortic dilatation noted. There is borderline dilatation of the aortic root, measuring 39 mm. There is mild dilatation of the  ascending aorta, measuring 42 mm.  8. The inferior vena cava is normal in size with <50% respiratory variability, suggesting right atrial pressure of 8 mmHg.  9. Agitated saline contrast bubble study was negative, with no evidence of any interatrial shunt. FINDINGS  Left Ventricle: Left ventricular ejection fraction, by estimation, is 70 to 75%. The left ventricle has hyperdynamic function. The left ventricle has no regional wall motion abnormalities. The left ventricular internal cavity size was normal in size. There is moderate concentric left ventricular hypertrophy. The interventricular septum is flattened in systole, consistent with right ventricular pressure overload. Left ventricular diastolic parameters are consistent with Grade I diastolic dysfunction (impaired relaxation). Right Ventricle: The right ventricular size is severely enlarged. No increase in right ventricular wall thickness. Right ventricular systolic function is severely reduced. Left Atrium: Left atrial size was normal in size. Right Atrium: Right atrial size was mildly dilated. Pericardium: A small pericardial effusion is present. The pericardial effusion is surrounding the apex. Mitral Valve: The mitral valve is normal in structure. Trivial mitral valve regurgitation. No evidence of mitral valve stenosis. Tricuspid Valve: The tricuspid valve is normal in structure. Tricuspid valve regurgitation is mild . No evidence of tricuspid stenosis. Aortic Valve: The aortic valve is normal in structure. Aortic valve regurgitation is trivial. No aortic stenosis is present. Pulmonic Valve: The pulmonic valve was normal in structure. Pulmonic valve regurgitation is trivial. No evidence of pulmonic stenosis. Aorta: The aortic root is normal in size and structure and aortic dilatation noted. There is borderline dilatation of the aortic root, measuring 39 mm. There is mild dilatation of the ascending aorta, measuring 42 mm. Venous: The inferior vena cava is  normal in size with less than 50% respiratory variability, suggesting right atrial pressure of 8 mmHg. IAS/Shunts: No atrial level shunt detected by color flow Doppler. Agitated saline contrast was given intravenously to evaluate for intracardiac shunting. Agitated saline contrast bubble study was negative, with no evidence of any interatrial shunt.  LEFT VENTRICLE PLAX 2D LVIDd:         4.20 cm LVIDs:  2.80 cm LV PW:         1.10 cm LV IVS:        1.30 cm LVOT diam:     2.40 cm LV SV:         70 LV SV Index:   32 LVOT Area:     4.52 cm  RIGHT VENTRICLE RV Basal diam:  3.00 cm RV S prime:     13.60 cm/s TAPSE (M-mode): 1.4 cm LEFT ATRIUM             Index        RIGHT ATRIUM           Index LA diam:        2.80 cm 1.29 cm/m   RA Area:     15.80 cm LA Vol (A2C):   55.7 ml 25.65 ml/m  RA Volume:   43.50 ml  20.03 ml/m LA Vol (A4C):   34.7 ml 15.98 ml/m LA Biplane Vol: 45.7 ml 21.05 ml/m  AORTIC VALVE LVOT Vmax:   101.00 cm/s LVOT Vmean:  67.700 cm/s LVOT VTI:    0.155 m  AORTA Ao Root diam: 3.90 cm Ao Asc diam:  4.20 cm  SHUNTS Systemic VTI:  0.16 m Systemic Diam: 2.40 cm Toribio Fuel MD Electronically signed by Toribio Fuel MD Signature Date/Time: 09/21/2024/3:41:00 PM    Final      Scheduled Meds:  arformoterol   15 mcg Nebulization BID   aspirin   81 mg Oral Daily   budesonide  (PULMICORT ) nebulizer solution  0.5 mg Nebulization BID   Chlorhexidine  Gluconate Cloth  6 each Topical Daily   cloNIDine   0.1 mg Oral TID   enoxaparin  (LOVENOX ) injection  40 mg Subcutaneous Q24H   furosemide   40 mg Intravenous Q12H   levETIRAcetam   500 mg Oral BID   losartan   100 mg Oral Daily   predniSONE   40 mg Oral Q breakfast   revefenacin  175 mcg Nebulization Daily   sertraline   25 mg Oral Daily   Continuous Infusions:   LOS: 2 days    Time spent:    Sigurd Pac, MD [facility]   09/23/2024, 11:42 AM

## 2024-09-24 DIAGNOSIS — J9601 Acute respiratory failure with hypoxia: Secondary | ICD-10-CM | POA: Diagnosis not present

## 2024-09-24 MED ORDER — PREDNISONE 20 MG PO TABS
30.0000 mg | ORAL_TABLET | Freq: Every day | ORAL | Status: AC
  Administered 2024-09-25: 30 mg via ORAL
  Filled 2024-09-24: qty 1

## 2024-09-24 MED ORDER — FUROSEMIDE 40 MG PO TABS
40.0000 mg | ORAL_TABLET | Freq: Every day | ORAL | Status: DC
  Administered 2024-09-24: 40 mg via ORAL
  Filled 2024-09-24: qty 1

## 2024-09-24 NOTE — Progress Notes (Signed)
 PROGRESS NOTE    Richard [name]  FMW:[id number] DOB: [birth year] DOA: 09/21/2024 PCP: Sim Emery CROME, MD   53 y.o. male active smoker with medical history significant of HTN, COPD, chronic respiratory failure on 4 to 5 L of oxygen , NSCLC stage IIIb s/p palliative radiation, chemotherapy and immunotherapy stopped due to nonadherence to treatments, TBI, ICH, seizure disorder, aortic aneurysm, history of DVT, tobacco abuse, and history of substance abuse presented to the ED with worsening dyspnea on exertion.  In the ED he was profoundly hypoxic requiring 10 to 15 L high flow, chest x-ray noted postradiation changes, moderate right pleural effusion and left lower lobe mass  Subjective: -Breathing better, wants to go home, down to 8 L now, O2 sats in the 82 range  Assessment and Plan:  Acute on chronic respiratory failure with hypoxia Right pleural effusion -On 4L at home.  - Respiratory failure is multifactorial, CHF and pleural effusion contributing, in addition to COPD, lung mass and radiation fibrosis  -sp thoracentesis-1 L drained, -Volume status has improved, switch to oral Lasix  down to - Continue DuoNebs, pulmonary toilet - Down to 8 to 10 L high flow at this time, continue to wean as tolerated  COPD - Continue triple neb therapy, on prednisone  taper from ICU   Acute on chronic diastolic CHF - Echo 10/9 with EF 70 to 75% with hyperdynamic function, no WMA, moderate concentric left ventricular hypertrophy and grade 1 diastolic dysfunction, normal RV - Diuresed with IV Lasix , changed to p.o. today, continue losartan , add Aldactone -Poor candidate for SGLT2i with his cognitive issues  History of stage IIIb lung cancer -Prior history of radiation and some brief chemo/immunotherapy -Not currently undergoing any treatment, compliance has been an issue seen by Dr. Gatha -Repeat imaging with worsening size of malignancy   HTN  -Cut down clonidine , continue losartan    Seizure  disorder Hx TBI -Continue home Keppra  Seizure precautions   MDD -Continue home sertraline     DVT prophylaxis: Lovenox  Code Status: Full code Family Communication: None present Disposition Plan: Home tomorrow if stable  Consultants:    Procedures:   Antimicrobials:    Objective: Vitals:   09/24/24 0405 09/24/24 0440 09/24/24 0721 09/24/24 0757  BP: (!) 147/106  (!) 149/106   Pulse: 85 79 89   Resp: (!) 27 18 17    Temp: 98.2 F (36.8 C)  98.1 F (36.7 C)   TempSrc: Oral  Oral   SpO2: 91% 92% (!) 89% (!) 88%  Weight:      Height:        Intake/Output Summary (Last 24 hours) at 09/24/2024 1109 Last data filed at 09/24/2024 0859 Gross per 24 hour  Intake 840 ml  Output 4450 ml  Net -3610 ml   Filed Weights   09/21/24 1614 09/23/24 0544  Weight: 87.9 kg 85.7 kg    Examination:  General exam: Appears calm and comfortable  Respiratory system: Few scattered rhonchi, conducted upper airway sounds Cardiovascular system: S1 & S2 heard, RRR.  Abd: nondistended, soft and nontender.Normal bowel sounds heard. Central nervous system: Alert and oriented. No focal neurological deficits. Extremities: no edema Skin: No rashes Psychiatry:  Mood & affect appropriate.     Data Reviewed:   CBC: Recent Labs  Lab 09/21/24 0342 09/21/24 0905 09/21/24 1427 09/22/24 0349  WBC 6.1 7.4  --  8.6  NEUTROABS 4.8  --   --  7.2  HGB 15.6 17.1* 18.4* 14.5  HCT 48.0 53.8* 54.0* 44.3  MCV 98.4 100.7*  --  96.3  PLT 239 252  --  227   Basic Metabolic Panel: Recent Labs  Lab 09/21/24 0342 09/21/24 0905 09/21/24 1427 09/21/24 1613 09/22/24 0349  NA 140  --  135 137 139  K 3.8  --  6.7* 4.7 4.3  CL 107  --   --  102 101  CO2 21*  --   --  20* 26  GLUCOSE 88  --   --  131* 112*  BUN 9  --   --  15 10  CREATININE 0.75 0.80  --  0.99 0.71  CALCIUM 8.4*  --   --  8.7* 8.6*   GFR: Estimated Creatinine Clearance: 114.5 mL/min (by C-G formula based on SCr of 0.71  mg/dL). Liver Function Tests: Recent Labs  Lab 09/21/24 1613  PROT 7.0   No results for input(s): LIPASE, AMYLASE in the last 168 hours. No results for input(s): AMMONIA in the last 168 hours. Coagulation Profile: No results for input(s): INR, PROTIME in the last 168 hours. Cardiac Enzymes: No results for input(s): CKTOTAL, CKMB, CKMBINDEX, TROPONINI in the last 168 hours. BNP (last 3 results) No results for input(s): PROBNP in the last 8760 hours. HbA1C: No results for input(s): HGBA1C in the last 72 hours. CBG: Recent Labs  Lab 09/21/24 1608  GLUCAP 146*   Lipid Profile: No results for input(s): CHOL, HDL, LDLCALC, TRIG, CHOLHDL, LDLDIRECT in the last 72 hours. Thyroid  Function Tests: No results for input(s): TSH, T4TOTAL, FREET4, T3FREE, THYROIDAB in the last 72 hours. Anemia Panel: No results for input(s): VITAMINB12, FOLATE, FERRITIN, TIBC, IRON, RETICCTPCT in the last 72 hours. Urine analysis:    Component Value Date/Time   COLORURINE YELLOW 08/22/2024 0843   APPEARANCEUR CLEAR 08/22/2024 0843   LABSPEC 1.015 08/22/2024 0843   PHURINE 5.0 08/22/2024 0843   GLUCOSEU NEGATIVE 08/22/2024 0843   HGBUR NEGATIVE 08/22/2024 0843   HGBUR negative 01/16/2011 1530   BILIRUBINUR NEGATIVE 08/22/2024 0843   KETONESUR NEGATIVE 08/22/2024 0843   PROTEINUR 100 (A) 08/22/2024 0843   UROBILINOGEN 1.0 07/15/2015 2137   NITRITE NEGATIVE 08/22/2024 0843   LEUKOCYTESUR NEGATIVE 08/22/2024 0843   Sepsis Labs: @LABRCNTIP (procalcitonin:4,lacticidven:4)  ) Recent Results (from the past 240 hours)  Resp panel by RT-PCR (RSV, Flu A&B, Covid) Anterior Nasal Swab     Status: None   Collection Time: 09/21/24  3:43 AM   Specimen: Anterior Nasal Swab  Result Value Ref Range Status   SARS Coronavirus 2 by RT PCR NEGATIVE NEGATIVE Final   Influenza A by PCR NEGATIVE NEGATIVE Final   Influenza B by PCR NEGATIVE NEGATIVE Final     Comment: (NOTE) The Xpert Xpress SARS-CoV-2/FLU/RSV plus assay is intended as an aid in the diagnosis of influenza from Nasopharyngeal swab specimens and should not be used as a sole basis for treatment. Nasal washings and aspirates are unacceptable for Xpert Xpress SARS-CoV-2/FLU/RSV testing.  Fact Sheet for Patients: BloggerCourse.com  Fact Sheet for Healthcare Providers: SeriousBroker.it  This test is not yet approved or cleared by the [place]  FDA and has been authorized for detection and/or diagnosis of SARS-CoV-2 by FDA under an Emergency Use Authorization (EUA). This EUA will remain in effect (meaning this test can be used) for the duration of the COVID-19 declaration under Section 564(b)(1) of the Act, 21 U.S.C. section 360bbb-3(b)(1), unless the authorization is terminated or revoked.     Resp Syncytial Virus by PCR NEGATIVE NEGATIVE Final    Comment: (NOTE) Fact Sheet for Patients: BloggerCourse.com  Fact  Sheet for Healthcare Providers: SeriousBroker.it  This test is not yet approved or cleared by the [place]  FDA and has been authorized for detection and/or diagnosis of SARS-CoV-2 by FDA under an Emergency Use Authorization (EUA). This EUA will remain in effect (meaning this test can be used) for the duration of the COVID-19 declaration under Section 564(b)(1) of the Act, 21 U.S.C. section 360bbb-3(b)(1), unless the authorization is terminated or revoked.  Performed at [facility], [address] [place], [place] [address]      Radiology Studies: VAS US  LOWER EXTREMITY VENOUS (DVT) Result Date: 09/22/2024  Lower Venous DVT Study Patient Name:  ANKITH EDMONSTON  Date of Exam:   09/22/2024 Medical Rec #: [id number]     Accession #:    [id number] Date of Birth: Oct 03, 1971     Patient Gender: M Patient Age:   28 years Exam Location:  [facility]  Procedure:      VAS US  LOWER EXTREMITY VENOUS (DVT) Referring Phys: FONDA SHARPS --------------------------------------------------------------------------------  Indications: Pitting edema. Other Indications: Acute on chronic hypoxic respiratory failure (on 4-5 liters                    of oxygen  at home). Risk Factors: Stage IIIb non small cell lung cancer, status post palliative radiation. Chemotherapy and immunotherapy have been ceased. Limitations: Poor ultrasound/tissue interface and Multiple interruptions secondary to patient being given Lasix  immediately prior to study. Comparison       Prior negative bilateral LE venous studies done 08/22/24 and Study:           07/13/24 Performing Technologist: Alberta Lis RVS  Examination Guidelines: A complete evaluation includes B-mode imaging, spectral Doppler, color Doppler, and power Doppler as needed of all accessible portions of each vessel. Bilateral testing is considered an integral part of a complete examination. Limited examinations for reoccurring indications may be performed as noted. The reflux portion of the exam is performed with the patient in reverse Trendelenburg.  +---------+---------------+---------+-----------+----------+--------------+ RIGHT    CompressibilityPhasicitySpontaneityPropertiesThrombus Aging +---------+---------------+---------+-----------+----------+--------------+ CFV      Full           Yes      No                                  +---------+---------------+---------+-----------+----------+--------------+ SFJ      Full                                                        +---------+---------------+---------+-----------+----------+--------------+ FV Prox  Full           Yes      No                                  +---------+---------------+---------+-----------+----------+--------------+ FV Mid   Full                                                         +---------+---------------+---------+-----------+----------+--------------+ FV DistalFull                                                        +---------+---------------+---------+-----------+----------+--------------+  PFV      Full                                                        +---------+---------------+---------+-----------+----------+--------------+ POP      Full           Yes      No                                  +---------+---------------+---------+-----------+----------+--------------+ PTV      Full                                                        +---------+---------------+---------+-----------+----------+--------------+ PERO     Full                                                        +---------+---------------+---------+-----------+----------+--------------+   +---------+---------------+---------+-----------+----------+--------------+ LEFT     CompressibilityPhasicitySpontaneityPropertiesThrombus Aging +---------+---------------+---------+-----------+----------+--------------+ CFV      Full           Yes      No                                  +---------+---------------+---------+-----------+----------+--------------+ SFJ      Full                                                        +---------+---------------+---------+-----------+----------+--------------+ FV Prox  Full                                                        +---------+---------------+---------+-----------+----------+--------------+ FV Mid   Full                                                        +---------+---------------+---------+-----------+----------+--------------+ FV DistalFull                                                        +---------+---------------+---------+-----------+----------+--------------+ PFV      Full                                                         +---------+---------------+---------+-----------+----------+--------------+  POP      Full           Yes      No                                  +---------+---------------+---------+-----------+----------+--------------+ PTV      Full                                                        +---------+---------------+---------+-----------+----------+--------------+ PERO     Full                                                        +---------+---------------+---------+-----------+----------+--------------+     Summary: RIGHT: - No evidence of deep vein thrombosis in the lower extremity. No indirect evidence of obstruction proximal to the inguinal ligament.  - No cystic structure found in the popliteal fossa. Subcutaneous edema noted. Pulsatile waveforms throughout.  LEFT: - No evidence of deep vein thrombosis in the lower extremity. No indirect evidence of obstruction proximal to the inguinal ligament.  - No cystic structure found in the popliteal fossa. - Ultrasound characteristics of enlarged lymph nodes noted in the groin. Subcutaneous edema noted. Pulsatile waveforms throughout.  *See table(s) above for measurements and observations. Electronically signed by Gaile New MD on 09/22/2024 at 9:58:12 PM.    Final      Scheduled Meds:  arformoterol   15 mcg Nebulization BID   aspirin   81 mg Oral Daily   budesonide  (PULMICORT ) nebulizer solution  0.5 mg Nebulization BID   Chlorhexidine  Gluconate Cloth  6 each Topical Daily   cloNIDine   0.1 mg Oral Daily   enoxaparin  (LOVENOX ) injection  40 mg Subcutaneous Q24H   levETIRAcetam   500 mg Oral BID   losartan   100 mg Oral Daily   predniSONE   40 mg Oral Q breakfast   revefenacin  175 mcg Nebulization Daily   sertraline   25 mg Oral Daily   spironolactone  25 mg Oral Daily   Continuous Infusions:   LOS: 3 days    Time spent:    Sigurd Pac, MD [facility]   09/24/2024, 11:09 AM

## 2024-09-25 ENCOUNTER — Other Ambulatory Visit (HOSPITAL_COMMUNITY): Payer: Self-pay

## 2024-09-25 DIAGNOSIS — J9601 Acute respiratory failure with hypoxia: Secondary | ICD-10-CM | POA: Diagnosis not present

## 2024-09-25 LAB — BASIC METABOLIC PANEL WITH GFR
Anion gap: 9 (ref 5–15)
BUN: 20 mg/dL (ref 6–20)
CO2: 31 mmol/L (ref 22–32)
Calcium: 8.6 mg/dL — ABNORMAL LOW (ref 8.9–10.3)
Chloride: 99 mmol/L (ref 98–111)
Creatinine, Ser: 1.01 mg/dL (ref 0.61–1.24)
GFR, Estimated: >60 mL/min (ref >60)
Glucose, Bld: 101 mg/dL — ABNORMAL HIGH (ref 70–99)
Potassium: 3.8 mmol/L (ref 3.5–5.1)
Sodium: 139 mmol/L (ref 135–145)

## 2024-09-25 LAB — PATHOLOGIST SMEAR REVIEW

## 2024-09-25 MED ORDER — PREDNISONE 20 MG PO TABS
ORAL_TABLET | ORAL | 0 refills | Status: AC
  Filled 2024-09-25: qty 6, 4d supply, fill #0

## 2024-09-25 MED ORDER — ALBUTEROL SULFATE (2.5 MG/3ML) 0.083% IN NEBU
2.5000 mg | INHALATION_SOLUTION | Freq: Three times a day (TID) | RESPIRATORY_TRACT | 1 refills | Status: DC | PRN
  Filled 2024-09-25: qty 90, 10d supply, fill #0

## 2024-09-25 MED ORDER — CLONIDINE HCL 0.1 MG PO TABS: 0.1000 mg | ORAL_TABLET | Freq: Every day | ORAL | Status: DC

## 2024-09-25 MED ORDER — SPIRONOLACTONE 25 MG PO TABS
25.0000 mg | ORAL_TABLET | Freq: Every day | ORAL | 0 refills | Status: DC
  Filled 2024-09-25: qty 30, 30d supply, fill #0

## 2024-09-25 NOTE — Discharge Summary (Addendum)
 Physician Discharge Summary  Richard [name] FMW:[id number] DOB: [birth year] DOA: 09/21/2024  PCP: Sim Emery CROME, MD  Admit date: 09/21/2024 Discharge date: 09/25/2024  Time spent:45 minutes  Recommendations for Outpatient Follow-up:  PCP in 1 week, please check BMP at follow-up [facility] in 1 month Oncology Dr. Sherrod in 2 to 3 weeks   Discharge Diagnoses:  Principal Problem:   Acute hypoxemic respiratory failure (HCC) COPD Chronic hypoxic respiratory failure on 5 L home O2 Non-small cell lung cancer stage IIIb Pulmonary fibrosis following radiation Noncompliance Seizure disorder History of TBI Major depression   Discharge Condition: Fair  Diet recommendation: Low-sodium  Filed Weights   09/21/24 1614 09/23/24 0544  Weight: 87.9 kg 85.7 kg    History of present illness:   53 y.o. male active smoker with medical history significant of HTN, COPD, chronic respiratory failure on 4 to 5 L of oxygen , NSCLC stage IIIb s/p palliative radiation, chemotherapy and immunotherapy stopped due to nonadherence to treatments, TBI, ICH, seizure disorder, aortic aneurysm, history of DVT, tobacco abuse, and history of substance abuse presented to the ED with worsening dyspnea on exertion. In the ED he was profoundly hypoxic requiring 10 to 15 L high flow, chest x-ray noted postradiation changes, moderate right pleural effusion and left lower lobe mass   Hospital Course:   Acute on chronic respiratory failure with hypoxia Right pleural effusion -On 4L at home.  - Respiratory failure is multifactorial, CHF and pleural effusion contributing, in addition to COPD, lung mass and radiation fibrosis  - Admitted to ICU with severe hypoxic respiratory failure, treated with BiPAP and high flow O2 , sp thoracentesis-1 L drained, fluid was negative for malignancy -Volume status has improved, switch to oral Lasix   - Continue DuoNebs, pulmonary toilet - O2 needs have improved from 30 L high flow  few days ago down to his baseline 5 L nasal cannula -Discharged home in a stable condition, follow-up with PCP in 1 week   COPD - Continue triple neb therapy, on prednisone  taper from ICU   Acute on chronic diastolic CHF - Echo 10/9 with EF 70 to 75% with hyperdynamic function, no WMA, moderate concentric left ventricular hypertrophy and grade 1 diastolic dysfunction, normal RV - Diuresed with IV Lasix , changed to p.o. today, continue losartan , started on Aldactone -Poor candidate for SGLT2i with his cognitive issues -Referral sent to [facility] care   History of stage IIIb lung cancer -Prior history of radiation and some brief chemo/immunotherapy -Not currently undergoing any treatment, compliance has been an issue seen by Dr. Gatha -Repeat imaging with worsening size of malignancy -Emphasized importance of follow-up with oncology   HTN  -Cut down clonidine , continue losartan    Seizure disorder Hx TBI -Continue home Keppra  Seizure precautions   MDD -Continue home sertraline   Discharge Exam: Vitals:   09/25/24 0400 09/25/24 0730  BP: (!) 141/107 (!) 145/100  Pulse: 83 91  Resp: 19 18  Temp: 98.1 F (36.7 C) 97.7 F (36.5 C)  SpO2: 95% 92%      General exam: Appears calm and comfortable, AAO x 2 Respiratory system: Poor, air movement bilaterally Cardiovascular system: S1 & S2 heard, RRR.  Abd: nondistended, soft and nontender.Normal bowel sounds heard. Central nervous system: Alert and oriented. No focal neurological deficits. Extremities: no edema Skin: No rashes Psychiatry:  Mood & affect appropriate Discharge Instructions   Discharge Instructions     Diet - low sodium heart healthy   Complete by: As directed  Increase activity slowly   Complete by: As directed       Allergies as of 09/25/2024       Reactions   Dilaudid  [hydromorphone  Hcl] Nausea Only, Other (See Comments)   Bradycardia  Hyperthermia    Tape Itching, Dermatitis         Medication List     STOP taking these medications    Symbicort  160-4.5 MCG/ACT inhaler Generic drug: budesonide -formoterol        TAKE these medications    albuterol  108 (90 Base) MCG/ACT inhaler Commonly known as: VENTOLIN  HFA Inhale 2 puffs into the lungs every 4 (four) hours as needed for wheezing or shortness of breath.   albuterol  (2.5 MG/3ML) 0.083% nebulizer solution Commonly known as: PROVENTIL  Take 3 mLs (2.5 mg total) by nebulization 3 (three) times daily as needed for shortness of breath or wheezing.   aspirin  81 MG chewable tablet Chew 81 mg by mouth daily.   cloNIDine  0.1 MG tablet Commonly known as: CATAPRES  Take 1 tablet (0.1 mg total) by mouth daily. Take 2 tablets (0.2mg ) by mouth in the morning and evening (0900 and 1700) and take 1 tablet (0.1mg ) at bedtime. What changed:  how much to take when to take this   furosemide  40 MG tablet Commonly known as: Lasix  Take 1 tablet (40 mg total) by mouth daily. What changed: when to take this   levETIRAcetam  500 MG tablet Commonly known as: KEPPRA  Take 1 tablet (500 mg total) by mouth 2 (two) times daily.   losartan  100 MG tablet Commonly known as: COZAAR  Take 100 mg by mouth daily.   predniSONE  20 MG tablet Commonly known as: DELTASONE  Take 2 tablets (40 mg total) by mouth daily for 2 days, THEN 1 tablet (20 mg total) daily for 2 days, THEN stop Start taking on: September 25, 2024   sertraline  25 MG tablet Commonly known as: ZOLOFT  Take 25 mg by mouth daily.   spironolactone 25 MG tablet Commonly known as: ALDACTONE Take 1 tablet (25 mg total) by mouth daily.   Trelegy Ellipta  100-62.5-25 MCG/ACT Aepb Generic drug: Fluticasone -Umeclidin-Vilant Inhale 1 puff into the lungs daily.       Allergies  Allergen Reactions   Dilaudid  [Hydromorphone  Hcl] Nausea Only and Other (See Comments)    Bradycardia  Hyperthermia    Tape Itching and Dermatitis    Follow-up Information     Sim Emery CROME,  MD Follow up.   Specialty: Internal Medicine Contact information: [address] [place] [address] [phone]                  The results of significant diagnostics from this hospitalization (including imaging, microbiology, ancillary and laboratory) are listed below for reference.    Significant Diagnostic Studies: VAS US  LOWER EXTREMITY VENOUS (DVT) Result Date: 09/22/2024  Lower Venous DVT Study Patient Name:  Richard [name]  Date of Exam:   09/22/2024 Medical Rec #: [id number]     Accession #:    [id number] Date of Birth: 06-Dec-1971     Patient Gender: M Patient Age:   73 years Exam Location:  [facility] Procedure:      VAS US  LOWER EXTREMITY VENOUS (DVT) Referring Phys: FONDA SHARPS --------------------------------------------------------------------------------  Indications: Pitting edema. Other Indications: Acute on chronic hypoxic respiratory failure (on 4-5 liters                    of oxygen  at home). Risk Factors: Stage IIIb non small  cell lung cancer, status post palliative radiation. Chemotherapy and immunotherapy have been ceased. Limitations: Poor ultrasound/tissue interface and Multiple interruptions secondary to patient being given Lasix  immediately prior to study. Comparison       Prior negative bilateral LE venous studies done 08/22/24 and Study:           07/13/24 Performing Technologist: Alberta Lis RVS  Examination Guidelines: A complete evaluation includes B-mode imaging, spectral Doppler, color Doppler, and power Doppler as needed of all accessible portions of each vessel. Bilateral testing is considered an integral part of a complete examination. Limited examinations for reoccurring indications may be performed as noted. The reflux portion of the exam is performed with the patient in reverse Trendelenburg.  +---------+---------------+---------+-----------+----------+--------------+ RIGHT    CompressibilityPhasicitySpontaneityPropertiesThrombus  Aging +---------+---------------+---------+-----------+----------+--------------+ CFV      Full           Yes      No                                  +---------+---------------+---------+-----------+----------+--------------+ SFJ      Full                                                        +---------+---------------+---------+-----------+----------+--------------+ FV Prox  Full           Yes      No                                  +---------+---------------+---------+-----------+----------+--------------+ FV Mid   Full                                                        +---------+---------------+---------+-----------+----------+--------------+ FV DistalFull                                                        +---------+---------------+---------+-----------+----------+--------------+ PFV      Full                                                        +---------+---------------+---------+-----------+----------+--------------+ POP      Full           Yes      No                                  +---------+---------------+---------+-----------+----------+--------------+ PTV      Full                                                        +---------+---------------+---------+-----------+----------+--------------+  PERO     Full                                                        +---------+---------------+---------+-----------+----------+--------------+   +---------+---------------+---------+-----------+----------+--------------+ LEFT     CompressibilityPhasicitySpontaneityPropertiesThrombus Aging +---------+---------------+---------+-----------+----------+--------------+ CFV      Full           Yes      No                                  +---------+---------------+---------+-----------+----------+--------------+ SFJ      Full                                                         +---------+---------------+---------+-----------+----------+--------------+ FV Prox  Full                                                        +---------+---------------+---------+-----------+----------+--------------+ FV Mid   Full                                                        +---------+---------------+---------+-----------+----------+--------------+ FV DistalFull                                                        +---------+---------------+---------+-----------+----------+--------------+ PFV      Full                                                        +---------+---------------+---------+-----------+----------+--------------+ POP      Full           Yes      No                                  +---------+---------------+---------+-----------+----------+--------------+ PTV      Full                                                        +---------+---------------+---------+-----------+----------+--------------+ PERO     Full                                                        +---------+---------------+---------+-----------+----------+--------------+  Summary: RIGHT: - No evidence of deep vein thrombosis in the lower extremity. No indirect evidence of obstruction proximal to the inguinal ligament.  - No cystic structure found in the popliteal fossa. Subcutaneous edema noted. Pulsatile waveforms throughout.  LEFT: - No evidence of deep vein thrombosis in the lower extremity. No indirect evidence of obstruction proximal to the inguinal ligament.  - No cystic structure found in the popliteal fossa. - Ultrasound characteristics of enlarged lymph nodes noted in the groin. Subcutaneous edema noted. Pulsatile waveforms throughout.  *See table(s) above for measurements and observations. Electronically signed by Gaile New MD on 09/22/2024 at 9:58:12 PM.    Final    DG CHEST PORT 1 VIEW Result Date: 09/21/2024 CLINICAL DATA:  Acute and chronic  respiratory failure. EXAM: PORTABLE CHEST 1 VIEW COMPARISON:  09/21/2024 FINDINGS: Shallow inspiration. Cardiac enlargement. Right perihilar mass with scarring in the right mid lung likely representing post treatment changes with history of lung cancer. Appearances are similar to prior studies. Mild interstitial changes in the left lung base, likely fibrosis, unchanged. No pleural effusion or pneumothorax. Tortuous aorta. IMPRESSION: Unchanged right perihilar mass and scarring consistent with post treatment changes. Interstitial changes in the left base, likely fibrosis also unchanged. Cardiac enlargement. Electronically Signed   By: Elsie Gravely M.D.   On: 09/21/2024 20:36   ECHOCARDIOGRAM COMPLETE BUBBLE STUDY Result Date: 09/21/2024    ECHOCARDIOGRAM REPORT   Patient Name:   Richard [name] Date of Exam: 09/21/2024 Medical Rec #:  [id number]    Height:       69.0 in Accession #:    [id number]   Weight:       224.9 lb Date of Birth:  Mar 16, 1971    BSA:          2.172 m Patient Age:    53 years     BP:           168/117 mmHg Patient Gender: M            HR:           113 bpm. Exam Location:  Inpatient Procedure: 2D Echo and Saline Contrast Bubble Study (Both Spectral and Color            Flow Doppler were utilized during procedure). STAT ECHO Indications:    dyspnea  History:        Patient has prior history of Echocardiogram examinations, most                 recent 05/19/2024. COPD and lung cancer, Signs/Symptoms:Shortness                 of Breath and Edema; Risk Factors:Hypertension and Current                 Smoker.  Sonographer:    Tinnie Barefoot RDCS Referring Phys: [id number] MARSHA ADA IMPRESSIONS  1. Left ventricular ejection fraction, by estimation, is 70 to 75%. The left ventricle has hyperdynamic function. The left ventricle has no regional wall motion abnormalities. There is moderate concentric left ventricular hypertrophy. Left ventricular diastolic parameters are consistent with Grade I diastolic  dysfunction (impaired relaxation). There is the interventricular septum is flattened in systole, consistent with right ventricular pressure overload.  2. Right ventricular systolic function is severely reduced. The right ventricular size is severely enlarged.  3. Right atrial size was mildly dilated.  4. A small pericardial effusion is present. The pericardial effusion is surrounding the apex.  5. The mitral valve  is normal in structure. Trivial mitral valve regurgitation. No evidence of mitral stenosis.  6. The aortic valve is normal in structure. Aortic valve regurgitation is trivial. No aortic stenosis is present.  7. Aortic dilatation noted. There is borderline dilatation of the aortic root, measuring 39 mm. There is mild dilatation of the ascending aorta, measuring 42 mm.  8. The inferior vena cava is normal in size with <50% respiratory variability, suggesting right atrial pressure of 8 mmHg.  9. Agitated saline contrast bubble study was negative, with no evidence of any interatrial shunt. FINDINGS  Left Ventricle: Left ventricular ejection fraction, by estimation, is 70 to 75%. The left ventricle has hyperdynamic function. The left ventricle has no regional wall motion abnormalities. The left ventricular internal cavity size was normal in size. There is moderate concentric left ventricular hypertrophy. The interventricular septum is flattened in systole, consistent with right ventricular pressure overload. Left ventricular diastolic parameters are consistent with Grade I diastolic dysfunction (impaired relaxation). Right Ventricle: The right ventricular size is severely enlarged. No increase in right ventricular wall thickness. Right ventricular systolic function is severely reduced. Left Atrium: Left atrial size was normal in size. Right Atrium: Right atrial size was mildly dilated. Pericardium: A small pericardial effusion is present. The pericardial effusion is surrounding the apex. Mitral Valve: The  mitral valve is normal in structure. Trivial mitral valve regurgitation. No evidence of mitral valve stenosis. Tricuspid Valve: The tricuspid valve is normal in structure. Tricuspid valve regurgitation is mild . No evidence of tricuspid stenosis. Aortic Valve: The aortic valve is normal in structure. Aortic valve regurgitation is trivial. No aortic stenosis is present. Pulmonic Valve: The pulmonic valve was normal in structure. Pulmonic valve regurgitation is trivial. No evidence of pulmonic stenosis. Aorta: The aortic root is normal in size and structure and aortic dilatation noted. There is borderline dilatation of the aortic root, measuring 39 mm. There is mild dilatation of the ascending aorta, measuring 42 mm. Venous: The inferior vena cava is normal in size with less than 50% respiratory variability, suggesting right atrial pressure of 8 mmHg. IAS/Shunts: No atrial level shunt detected by color flow Doppler. Agitated saline contrast was given intravenously to evaluate for intracardiac shunting. Agitated saline contrast bubble study was negative, with no evidence of any interatrial shunt.  LEFT VENTRICLE PLAX 2D LVIDd:         4.20 cm LVIDs:         2.80 cm LV PW:         1.10 cm LV IVS:        1.30 cm LVOT diam:     2.40 cm LV SV:         70 LV SV Index:   32 LVOT Area:     4.52 cm  RIGHT VENTRICLE RV Basal diam:  3.00 cm RV S prime:     13.60 cm/s TAPSE (M-mode): 1.4 cm LEFT ATRIUM             Index        RIGHT ATRIUM           Index LA diam:        2.80 cm 1.29 cm/m   RA Area:     15.80 cm LA Vol (A2C):   55.7 ml 25.65 ml/m  RA Volume:   43.50 ml  20.03 ml/m LA Vol (A4C):   34.7 ml 15.98 ml/m LA Biplane Vol: 45.7 ml 21.05 ml/m  AORTIC VALVE LVOT Vmax:   101.00 cm/s  LVOT Vmean:  67.700 cm/s LVOT VTI:    0.155 m  AORTA Ao Root diam: 3.90 cm Ao Asc diam:  4.20 cm  SHUNTS Systemic VTI:  0.16 m Systemic Diam: 2.40 cm Toribio Fuel MD Electronically signed by Toribio Fuel MD Signature Date/Time:  09/21/2024/3:41:00 PM    Final    DG Chest Portable 1 View Result Date: 09/21/2024 CLINICAL DATA:  Dyspnea EXAM: PORTABLE CHEST 1 VIEW COMPARISON:  09/11/2024 FINDINGS: Masslike consolidation within the right hilum representing post radiation changes better appreciated on CT examination of 09/11/2024, right-sided volume loss, and moderate right pleural effusion are unchanged. Known retrocardiac mass within the left lower lobe seen on prior CT examination is not well appreciated on the current exam. Left lung is clear. No pneumothorax. Cardiac size within limits. IMPRESSION: 1. Stable post radiation changes within the right hilum, right-sided volume loss, and moderate right pleural effusion. 2. Known left lower lobe mass is not well appreciated on the current exam. There in Electronically Signed   By: Dorethia Molt M.D.   On: 09/21/2024 04:01   CT Angio Chest PE W/Cm &/Or Wo Cm Result Date: 09/11/2024 CLINICAL DATA:  Chronic dyspnea, worse over the past few days. History of lung cancer, COPD and CHF. Smoker. EXAM: CT ANGIOGRAPHY CHEST WITH CONTRAST TECHNIQUE: Multidetector CT imaging of the chest was performed using the standard protocol during bolus administration of intravenous contrast. Multiplanar CT image reconstructions and MIPs were obtained to evaluate the vascular anatomy. RADIATION DOSE REDUCTION: This exam was performed according to the departmental dose-optimization program which includes automated exposure control, adjustment of the mA and/or kV according to patient size and/or use of iterative reconstruction technique. CONTRAST:  75mL OMNIPAQUE  IOHEXOL  350 MG/ML SOLN COMPARISON:  06/15/2024 and chest radiographs dated 09/11/2024 and 08/21/2024. FINDINGS: Cardiovascular: Normally opacified pulmonary arteries with no pulmonary arterial filling defects seen. Stable enlarged heart and small pericardial effusion measuring up to 1 cm in thickness. Stable aneurysmal dilatation of the ascending thoracic  aorta, currently measuring 4.3 cm in maximum diameter. Stable enlarged central pulmonary arteries with a main pulmonary artery diameter of 3.7 cm. Mediastinum/Nodes: No enlarged mediastinal, hilar, or axillary lymph nodes. Thyroid  gland, trachea, and esophagus demonstrate no significant findings. Lungs/Pleura: Interval moderate-sized right pleural effusion increased probable postradiation changes/atelectasis in the medial right upper lobe and right hilar region with associated diffuse bronchial narrowing in those areas. Increased size of a mass-like density in the medial left lower lobe. This currently measures 3.7 x 2.2 cm on image number 78/6, previously 1.6 x 1.4 cm on 06/15/2024 and 1.3 x 1.1 cm on 05/18/2024. Upper Abdomen: Stable previously characterized left adrenal adenoma. Musculoskeletal: Thoracic spine degenerative changes. No evidence of bony metastatic disease. Review of the MIP images confirms the above findings. IMPRESSION: 1. No pulmonary emboli. 2. Increased size of a mass-like density in the medial left lower lobe, currently measuring 3.7 x 2.2 cm, previously 1.6 x 1.4 cm on 06/15/2024 and 1.3 x 1.1 cm on 05/18/2024. This is suspicious for an enlarging lung carcinoma. Recommend further evaluation with elective outpatient PET-CT. 3. Increased probable postradiation changes/atelectasis in the medial right upper lobe and right hilar region with associated diffuse bronchial narrowing in those areas. 4. Interval moderate-sized right pleural effusion. 5. Stable cardiomegaly and small pericardial effusion. 6. Stable 4.3 cm ascending thoracic aortic aneurysm. 7. Stable enlarged central pulmonary arteries, compatible with pulmonary arterial hypertension. 8. Stable previously characterized left adrenal adenoma. Electronically Signed   By: Elspeth Robynn HERO.D.  On: 09/11/2024 17:07   DG Chest 1 View Result Date: 09/11/2024 CLINICAL DATA:  Shortness of breath EXAM: CHEST  1 VIEW COMPARISON:  08/21/2024, CT  06/15/2024, 12/17/2023, 09/16/2023, 10/16/2023 chest x-ray 18 2025, 01/15/2023 FINDINGS: Volume loss right thorax. Distortion and right hilar opacity corresponding to known lung mass and treatment changes. Bandlike density extending to the right upper lung pleural surface. Stable cardiomediastinal silhouette. No pneumothorax. Possible small right pleural effusion IMPRESSION: Volume loss right thorax with distortion and right hilar opacity corresponding to known lung mass and treatment changes. Possible small right pleural effusion. Electronically Signed   By: Luke Bun M.D.   On: 09/11/2024 15:45    Microbiology: Recent Results (from the past 240 hours)  Resp panel by RT-PCR (RSV, Flu A&B, Covid) Anterior Nasal Swab     Status: None   Collection Time: 09/21/24  3:43 AM   Specimen: Anterior Nasal Swab  Result Value Ref Range Status   SARS Coronavirus 2 by RT PCR NEGATIVE NEGATIVE Final   Influenza A by PCR NEGATIVE NEGATIVE Final   Influenza B by PCR NEGATIVE NEGATIVE Final    Comment: (NOTE) The Xpert Xpress SARS-CoV-2/FLU/RSV plus assay is intended as an aid in the diagnosis of influenza from Nasopharyngeal swab specimens and should not be used as a sole basis for treatment. Nasal washings and aspirates are unacceptable for Xpert Xpress SARS-CoV-2/FLU/RSV testing.  Fact Sheet for Patients: BloggerCourse.com  Fact Sheet for Healthcare Providers: SeriousBroker.it  This test is not yet approved or cleared by the [place]  FDA and has been authorized for detection and/or diagnosis of SARS-CoV-2 by FDA under an Emergency Use Authorization (EUA). This EUA will remain in effect (meaning this test can be used) for the duration of the COVID-19 declaration under Section 564(b)(1) of the Act, 21 U.S.C. section 360bbb-3(b)(1), unless the authorization is terminated or revoked.     Resp Syncytial Virus by PCR NEGATIVE NEGATIVE Final     Comment: (NOTE) Fact Sheet for Patients: BloggerCourse.com  Fact Sheet for Healthcare Providers: SeriousBroker.it  This test is not yet approved or cleared by the [place]  FDA and has been authorized for detection and/or diagnosis of SARS-CoV-2 by FDA under an Emergency Use Authorization (EUA). This EUA will remain in effect (meaning this test can be used) for the duration of the COVID-19 declaration under Section 564(b)(1) of the Act, 21 U.S.C. section 360bbb-3(b)(1), unless the authorization is terminated or revoked.  Performed at [facility], [address], [place], [place] [address]      Labs: Basic Metabolic Panel: Recent Labs  Lab 09/21/24 0342 09/21/24 0905 09/21/24 1427 09/21/24 1613 09/22/24 0349 09/25/24 0300  NA 140  --  135 137 139 139  K 3.8  --  6.7* 4.7 4.3 3.8  CL 107  --   --  102 101 99  CO2 21*  --   --  20* 26 31  GLUCOSE 88  --   --  131* 112* 101*  BUN 9  --   --  15 10 20   CREATININE 0.75 0.80  --  0.99 0.71 1.01  CALCIUM 8.4*  --   --  8.7* 8.6* 8.6*   Liver Function Tests: Recent Labs  Lab 09/21/24 1613  PROT 7.0   No results for input(s): LIPASE, AMYLASE in the last 168 hours. No results for input(s): AMMONIA in the last 168 hours. CBC: Recent Labs  Lab 09/21/24 0342 09/21/24 0905 09/21/24 1427 09/22/24 0349  WBC 6.1 7.4  --  8.6  NEUTROABS 4.8  --   --  7.2  HGB 15.6 17.1* 18.4* 14.5  HCT 48.0 53.8* 54.0* 44.3  MCV 98.4 100.7*  --  96.3  PLT 239 252  --  227   Cardiac Enzymes: No results for input(s): CKTOTAL, CKMB, CKMBINDEX, TROPONINI in the last 168 hours. BNP: BNP (last 3 results) Recent Labs    08/21/24 2339 09/11/24 1405 09/21/24 0905  BNP 484.4* 326.5* 376.9*    ProBNP (last 3 results) No results for input(s): PROBNP in the last 8760 hours.  CBG: Recent Labs  Lab 09/21/24 1608  GLUCAP 146*       Signed:  Sigurd Pac  MD.  [facility] 09/25/2024, 9:58 AM

## 2024-09-25 NOTE — Care Management Important Message (Signed)
 Important Message  Patient Details  Name: Richard [name] MRN: [id number] Date of Birth: 1971-10-14   Important Message Given:  Yes - Medicare IM     Vonzell Arrie Sharps 09/25/2024, 11:00 AM

## 2024-09-25 NOTE — Progress Notes (Signed)
 Reviewed AVS, patient expressed understanding of medications, MD follow up reviewed.   Patient states all belongings brought to the hospital at time of admission are accounted for and packed to take home.  Picked up medications from [facility] pharmacy. [facility] Transport contacted to transport patient to entrance C, where family member was waiting in vehicle to transport home.

## 2024-09-25 NOTE — TOC Initial Note (Signed)
 Transition of Care [facility]) - Initial/Assessment Note    Patient Details  Name: Richard [name] MRN: [id number] Date of Birth: 05-13-1971  Transition of Care [facility]) CM/SW Contact:    Waddell Barnie Rama, RN Phone Number: 09/25/2024, 10:15 AM  Clinical Narrative:                 From home with spouse, has PCP and insurance on file, states has no HH services in place at this time, has home oxygen  5-6 liters and nebulizer machine at home.  States family member ( wife)  will transport them home at [employer] and family is support system, states gets medications from [place].  Pta self ambulatory.   There are no ICM needs identified  at this time.  Please place consult for ICM needs.      Expected Discharge Plan: Home/Self Care Barriers to Discharge: No Barriers Identified   Patient Goals and CMS Choice Patient states their goals for this hospitalization and ongoing recovery are:: return home   Choice offered to / list presented to : NA      Expected Discharge Plan and Services In-house Referral: NA Discharge Planning Services: CM Consult Post Acute Care Choice: NA Living arrangements for the past 2 months: Single Family Home Expected Discharge Date: 09/25/24               DME Arranged: N/A DME Agency: NA       HH Arranged: NA          Prior Living Arrangements/Services Living arrangements for the past 2 months: Single Family Home Lives with:: Spouse Patient language and need for interpreter reviewed:: Yes Do you feel safe going back to the place where you live?: Yes      Need for Family Participation in Patient Care: Yes (Comment) Care giver support system in place?: Yes (comment) Current home services: DME (home oxygen  with [employer] 5-6 liters, nebulizer machine) Criminal Activity/Legal Involvement Pertinent to Current Situation/Hospitalization: No - Comment as needed  Activities of Daily Living      Permission Sought/Granted Permission sought to share information with :  Case Manager Permission granted to share information with : Yes, Verbal Permission Granted              Emotional Assessment   Attitude/Demeanor/Rapport: Engaged Affect (typically observed): Appropriate Orientation: : Oriented to Self, Oriented to Place, Oriented to  Time, Oriented to Situation Alcohol / Substance Use: Not Applicable Psych Involvement: No (comment)  Admission diagnosis:  COPD exacerbation (HCC) [J44.1] Noncompliance [Z91.199] Acute respiratory failure with hypoxia (HCC) [J96.01] Acute on chronic respiratory failure with hypoxia (HCC) [J96.21] Malignant neoplasm of lung, unspecified laterality, unspecified part of lung (HCC) [C34.90] Acute hypoxemic respiratory failure (HCC) [J96.01] Patient Active Problem List   Diagnosis Date Noted   Acute hypoxemic respiratory failure (HCC) 09/21/2024   Pleural effusion on right 09/11/2024   Diastolic congestive heart failure (HCC) 08/22/2024   Bilateral lower extremity edema 08/22/2024   History of lung cancer 08/22/2024   History of DVT (deep vein thrombosis) 08/22/2024   Polysubstance abuse (HCC) 08/22/2024   Elevated troponin 05/19/2024   Cocaine use 05/19/2024   Encounter for smoking cessation counseling 05/19/2024   Malignant neoplasm of overlapping sites of lung (HCC) 09/17/2023   Pneumonia of left lung due to infectious organism 09/17/2023   Acute on chronic respiratory failure with hypoxia (HCC) 09/17/2023   CAP (community acquired pneumonia) 09/16/2023   H/O: CVA (cerebrovascular accident) 09/16/2023   H/O deep vein thrombophlebitis of lower  extremity 09/16/2023   Acute respiratory failure with hypoxia (HCC) 09/16/2023   Left leg swelling 01/15/2023   Knee pain, left 01/11/2023   Acute hypoxic respiratory failure (HCC) 01/10/2023   Acute on chronic heart failure with preserved ejection fraction (HFpEF, >= 50%) (HCC) 01/10/2023   Acute bronchiolitis due to respiratory syncytial virus (RSV) 01/10/2023   History  of seizures 01/10/2023   Hypertension 12/25/2022   Malignant neoplasm of right lung (HCC) 12/25/2022   Rash 06/04/2022   Chronic respiratory failure with hypoxia (HCC) 06/04/2022   Mediastinal lymphadenopathy    Obstructive pneumonia    Counseling regarding advance care planning and goals of care    Malignant neoplasm of overlapping sites of right lung (HCC) 02/03/2022   COPD with acute exacerbation (HCC) 01/28/2022   Right sided weakness 10/13/2021   Mass of right lung 04/09/2020   Alcohol abuse with intoxication 03/04/2020   Hypoxia 03/04/2020   Swollen testicle    Atelectasis    Syncope 08/26/2015   Acute encephalopathy 08/26/2015   Seizure (HCC)    Acute pulmonary edema (HCC)    Lower leg DVT (deep venous thromboembolism), acute (HCC)    Protein-calorie malnutrition, severe (HCC) 02/19/2015   Bacteremia due to Staphylococcus 02/17/2015   Alcohol withdrawal delirium (HCC)    Altered mental status    Metabolic acidosis    Essential hypertension    Encephalopathy 02/11/2015   Alcohol intoxication 02/11/2015   Lactic acidosis 02/11/2015   Bleeding from the nose 03/19/2014   TIA (transient ischemic attack) 12/24/2013   Weakness 12/23/2013   Left-sided weakness 12/23/2013   Malignant hypertension 12/23/2013   Seizure disorder (HCC) 12/23/2013   Vomiting 06/18/2013   Chest pain 06/18/2013   Hypertensive urgency 10/30/2012   Migraine variant 10/30/2012   TOBACCO ABUSE 01/11/2008   HYPERTENSION, BENIGN 11/24/2007   CHRONIC FRONTAL SINUSITIS 11/24/2007   Convulsions (HCC) 11/24/2007   Headache(784.0) 11/24/2007   PCP:  Sim Emery CROME, MD Pharmacy:   [facility] [phone] - [place], [place] - [address] AT [facility] [address] [place] [address] Phone: [phone] Fax: [phone]     Social Drivers of Health (SDOH) Social History: SDOH Screenings   Food Insecurity: No Food Insecurity (09/23/2024)  Housing:  Low Risk  (09/23/2024)  Transportation Needs: Unmet Transportation Needs (09/23/2024)  Utilities: Not At Risk (09/23/2024)  Financial Resource Strain: Medium Risk (09/17/2023)  Physical Activity: Not on File (08/30/2023)   Received from [facility]  Social Connections: Not on File (08/30/2023)   Received from [facility]  Stress: Not on File (08/30/2023)   Received from [facility]  Tobacco Use: High Risk (08/22/2024)   SDOH Interventions:     Readmission Risk Interventions    09/25/2024    9:55 AM  Readmission Risk Prevention Plan  Transportation Screening Complete  Medication Review (RN Care Manager) Complete  PCP or Specialist appointment within 3-5 days of discharge Complete  HRI or Home Care Consult Complete  Palliative Care Screening Not Applicable  Skilled Nursing Facility Not Applicable

## 2024-09-25 NOTE — TOC Transition Note (Signed)
 Transition of Care [facility]) - Discharge Note   Patient Details  Name: Richard [name] MRN: [id number] Date of Birth: [birth year]  Transition of Care [facility]) CM/SW Contact:  Waddell Barnie Rama, RN Phone Number: 09/25/2024, 9:59 AM   Clinical Narrative:    For dc today, his wife will transport him home per patient.  He would like to have his  nebulizer medications.          Patient Goals and CMS Choice            Discharge Placement                       Discharge Plan and Services Additional resources added to the After Visit Summary for                                       Social Drivers of Health (SDOH) Interventions SDOH Screenings   Food Insecurity: No Food Insecurity (09/23/2024)  Housing: Low Risk  (09/23/2024)  Transportation Needs: Unmet Transportation Needs (09/23/2024)  Utilities: Not At Risk (09/23/2024)  Financial Resource Strain: Medium Risk (09/17/2023)  Physical Activity: Not on File (08/30/2023)   Received from [facility]  Social Connections: Not on File (08/30/2023)   Received from [facility]  Stress: Not on File (08/30/2023)   Received from [facility]  Tobacco Use: High Risk (08/22/2024)     Readmission Risk Interventions    09/25/2024    9:55 AM  Readmission Risk Prevention Plan  Transportation Screening Complete  Medication Review (RN Care Manager) Complete  PCP or Specialist appointment within 3-5 days of discharge Complete  HRI or Home Care Consult Complete  Palliative Care Screening Not Applicable  Skilled Nursing Facility Not Applicable

## 2024-09-25 NOTE — TOC CM/SW Note (Signed)
 Transition of Care [facility]) - Inpatient Brief Assessment   Patient Details  Name: Richard [name] MRN: [id number] Date of Birth: 1971/05/16  Transition of Care [facility]) CM/SW Contact:    Waddell Barnie Rama, RN Phone Number: 09/25/2024, 9:58 AM   Clinical Narrative: From home with spouse, has PCP and insurance on file, states has no HH services in place at this time, has home oxygen  5-6 liters and nebulizer machine at home.  States family member ( wife)  will transport them home at [employer] and family is support system, states gets medications from [place].  Pta self ambulatory.   There are no ICM needs identified  at this time.  Please place consult for ICM needs.     Transition of Care Asessment: Insurance and Status: Insurance coverage has been reviewed Patient has primary care physician: Yes Home environment has been reviewed: home with wife Prior level of function:: indep Prior/Current Home Services: Current home services (home oxygen  5-6 liters, nebulizer machine) Social Drivers of Health Review: SDOH reviewed no interventions necessary Readmission risk has been reviewed: Yes Transition of care needs: no transition of care needs at this time

## 2024-09-25 NOTE — Plan of Care (Signed)
   Problem: Clinical Measurements: Goal: Ability to maintain clinical measurements within normal limits will improve Outcome: Progressing Goal: Will remain free from infection Outcome: Progressing Goal: Diagnostic test results will improve Outcome: Progressing Goal: Respiratory complications will improve Outcome: Progressing Goal: Cardiovascular complication will be avoided Outcome: Progressing   Problem: Coping: Goal: Level of anxiety will decrease Outcome: Progressing   Problem: Safety: Goal: Ability to remain free from injury will improve Outcome: Progressing

## 2024-09-26 ENCOUNTER — Ambulatory Visit: Admitting: Internal Medicine

## 2024-10-05 ENCOUNTER — Ambulatory Visit: Admitting: Adult Health

## 2024-10-09 ENCOUNTER — Telehealth: Payer: Self-pay | Admitting: Internal Medicine

## 2024-10-09 ENCOUNTER — Inpatient Hospital Stay (HOSPITAL_BASED_OUTPATIENT_CLINIC_OR_DEPARTMENT_OTHER): Admitting: Internal Medicine

## 2024-10-09 VITALS — BP 137/115 | HR 98 | Temp 97.7°F | Resp 17 | Ht 69.0 in | Wt 189.0 lb

## 2024-10-09 DIAGNOSIS — Z79899 Other long term (current) drug therapy: Secondary | ICD-10-CM | POA: Diagnosis not present

## 2024-10-09 DIAGNOSIS — Z9981 Dependence on supplemental oxygen: Secondary | ICD-10-CM | POA: Diagnosis not present

## 2024-10-09 DIAGNOSIS — Z8673 Personal history of transient ischemic attack (TIA), and cerebral infarction without residual deficits: Secondary | ICD-10-CM | POA: Diagnosis not present

## 2024-10-09 DIAGNOSIS — C3490 Malignant neoplasm of unspecified part of unspecified bronchus or lung: Secondary | ICD-10-CM | POA: Diagnosis not present

## 2024-10-09 DIAGNOSIS — C7A8 Other malignant neuroendocrine tumors: Secondary | ICD-10-CM | POA: Diagnosis present

## 2024-10-09 DIAGNOSIS — Z91199 Patient's noncompliance with other medical treatment and regimen due to unspecified reason: Secondary | ICD-10-CM | POA: Diagnosis not present

## 2024-10-09 DIAGNOSIS — Z86718 Personal history of other venous thrombosis and embolism: Secondary | ICD-10-CM | POA: Diagnosis not present

## 2024-10-09 NOTE — Telephone Encounter (Signed)
 Scheduled patient for next appointment. Called and spoke with him, he is aware.

## 2024-10-09 NOTE — Progress Notes (Signed)
 [facility] [phone]   [phone]  OFFICE PROGRESS NOTE  Richard Emery CROME, MD [address] [place] [address]  DIAGNOSIS: stage IIIb (T4, N2, M0) non-small cell lung cancer, poorly differentiated carcinoma with neuroendocrine features diagnosed in January 2024 and presented with large right lower lobe lung mass in addition to right hilar and mediastinal lymphadenopathy.  PRIOR THERAPY:   1) status post short course of palliative radiotherapy with 30 Gray completed in March 2023.  2) A course of concurrent chemoradiation with weekly carboplatin  for AUC of 2 and paclitaxel  45 Mg/M2.  Status post 4 cycles. 3) Consolidation treatment with immunotherapy with Imfinzi  1500 Mg IV every 4 weeks.  First dose 04/29/2023.  Status post 3 cycles.  Patient inconsistent with compliance with his appointments and treatments.  Therefore his treatment was discontinued due to not receiving this on a regular basis   CURRENT THERAPY: Observation    INTERVAL HISTORY: Richard [name] 53 y.o. male returns to the clinic today for follow-up visit.Discussed the use of AI scribe software for clinical note transcription with the patient, who gave verbal consent to proceed.  History of Present Illness Richard [name] is a 53 year old male with stage 3B non-small cell lung cancer who presents for evaluation with repeat CT scan for restaging of his disease.  He was diagnosed with stage 3B non-small cell lung cancer, poorly differentiated carcinoma with neuroendocrine features, in January 2024. He underwent palliative radiotherapy followed by concurrent chemoradiation and three cycles of consolidation treatment with durvalumab , which was discontinued due to noncompliance. He has been on observation since then.  He is currently on 5 liters of oxygen , primarily used at home, and his breathing is manageable. A recent CT scan showed a mass-like density in the medial left lower  lobe measuring 3.7 by 2.2 cm; previously, it measured 1.6 by 1.4 cm.  He has experienced significant weight loss, from 225 pounds to 189 pounds, due to a lack of appetite and decreased food intake, stating 'sometimes I don't want to eat.' No chest pain, hemoptysis, nausea, vomiting, or diarrhea.      MEDICAL HISTORY: Past Medical History:  Diagnosis Date   Asthma    Brain bleed (HCC)    DVT (deep venous thrombosis) (HCC) 02/19/2015   RLE   GERD (gastroesophageal reflux disease)    History of home oxygen  therapy    Hypertension    Seizures (HCC)    last episode 03/2013   Stroke [facility])     ALLERGIES:  is allergic to dilaudid  [hydromorphone  hcl] and tape.  MEDICATIONS:  Current Outpatient Medications  Medication Sig Dispense Refill   albuterol  (PROVENTIL ) (2.5 MG/3ML) 0.083% nebulizer solution Take 3 mLs (2.5 mg total) by nebulization 3 (three) times daily as needed for shortness of breath or wheezing. 90 mL 1   albuterol  (VENTOLIN  HFA) 108 (90 Base) MCG/ACT inhaler Inhale 2 puffs into the lungs every 4 (four) hours as needed for wheezing or shortness of breath. 8 g 3   aspirin  81 MG chewable tablet Chew 81 mg by mouth daily.     cloNIDine  (CATAPRES ) 0.1 MG tablet Take 1 tablet (0.1 mg total) by mouth daily. Take 2 tablets (0.2mg ) by mouth in the morning and evening (0900 and 1700) and take 1 tablet (0.1mg ) at bedtime.     furosemide  (LASIX ) 40 MG tablet Take 1 tablet (40 mg total) by mouth daily. (Patient taking differently: Take 40 mg by mouth  2 (two) times daily.) 90 tablet 0   levETIRAcetam  (KEPPRA ) 500 MG tablet Take 1 tablet (500 mg total) by mouth 2 (two) times daily.     losartan  (COZAAR ) 100 MG tablet Take 100 mg by mouth daily.     sertraline  (ZOLOFT ) 25 MG tablet Take 25 mg by mouth daily.     spironolactone (ALDACTONE) 25 MG tablet Take 1 tablet (25 mg total) by mouth daily. 30 tablet 0   TRELEGY ELLIPTA  100-62.5-25 MCG/ACT AEPB Inhale 1 puff into the lungs daily.     No  current facility-administered medications for this visit.    SURGICAL HISTORY:  Past Surgical History:  Procedure Laterality Date   BRONCHIAL BIOPSY  02/10/2022   Procedure: BRONCHIAL BIOPSIES;  Surgeon: Jude Harden GAILS, MD;  Location: [facility];  Service: Cardiopulmonary;;   BRONCHIAL BRUSHINGS  02/10/2022   Procedure: BRONCHIAL BRUSHINGS;  Surgeon: Jude Harden GAILS, MD;  Location: [facility];  Service: Cardiopulmonary;;   BRONCHIAL NEEDLE ASPIRATION BIOPSY  02/10/2022   Procedure: BRONCHIAL NEEDLE ASPIRATION BIOPSIES;  Surgeon: Jude Harden GAILS, MD;  Location: [facility];  Service: Cardiopulmonary;;   BRONCHIAL WASHINGS  02/10/2022   Procedure: BRONCHIAL WASHINGS;  Surgeon: Jude Harden GAILS, MD;  Location: [facility];  Service: Cardiopulmonary;;   ENDOBRONCHIAL ULTRASOUND Bilateral 02/10/2022   Procedure: ENDOBRONCHIAL ULTRASOUND;  Surgeon: Jude Harden GAILS, MD;  Location: [facility];  Service: Cardiopulmonary;  Laterality: Bilateral;   LEG SURGERY     VIDEO BRONCHOSCOPY  02/10/2022   Procedure: VIDEO BRONCHOSCOPY WITHOUT FLUORO;  Surgeon: Jude Harden GAILS, MD;  Location: [facility];  Service: Cardiopulmonary;;    REVIEW OF SYSTEMS:  Constitutional: positive for anorexia, fatigue, and weight loss Eyes: negative Ears, nose, mouth, throat, and face: negative Respiratory: positive for dyspnea on exertion Cardiovascular: negative Gastrointestinal: negative Genitourinary:negative Integument/breast: negative Hematologic/lymphatic: negative Musculoskeletal:negative Neurological: negative Behavioral/Psych: negative Endocrine: negative Allergic/Immunologic: negative   PHYSICAL EXAMINATION: General appearance: alert, cooperative, fatigued, and no distress Head: Normocephalic, without obvious abnormality, atraumatic Neck: no adenopathy, no JVD, supple, symmetrical, trachea midline, and thyroid  not enlarged, symmetric, no tenderness/mass/nodules Lymph nodes: Cervical, supraclavicular, and  axillary nodes normal. Resp: clear to auscultation bilaterally Back: symmetric, no curvature. ROM normal. No CVA tenderness. Cardio: regular rate and rhythm, S1, S2 normal, no murmur, click, rub or gallop GI: soft, non-tender; bowel sounds normal; no masses,  no organomegaly Extremities: extremities normal, atraumatic, no cyanosis or edema Neurologic: Alert and oriented X 3, normal strength and tone. Normal symmetric reflexes. Normal coordination and gait  ECOG PERFORMANCE STATUS: 1 - Symptomatic but completely ambulatory  Blood pressure (!) 137/115, pulse 98, temperature 97.7 F (36.5 C), temperature source Temporal, resp. rate 17, height 5' 9 (1.753 m), weight 189 lb (85.7 kg), SpO2 (!) 82%.  LABORATORY DATA: Lab Results  Component Value Date   WBC 8.6 09/22/2024   HGB 14.5 09/22/2024   HCT 44.3 09/22/2024   MCV 96.3 09/22/2024   PLT 227 09/22/2024      Chemistry      Component Value Date/Time   NA 139 09/25/2024 0300   K 3.8 09/25/2024 0300   CL 99 09/25/2024 0300   CO2 31 09/25/2024 0300   BUN 20 09/25/2024 0300   CREATININE 1.01 09/25/2024 0300   CREATININE 1.30 (H) 06/12/2024 1325   CREATININE 0.91 12/05/2013 1605      Component Value Date/Time   CALCIUM 8.6 (L) 09/25/2024 0300   ALKPHOS 69 06/15/2024 0043   AST 18 06/15/2024 0043   AST 14 (L)  06/12/2024 1325   ALT 13 06/15/2024 0043   ALT 10 06/12/2024 1325   BILITOT 0.9 06/15/2024 0043   BILITOT 0.5 06/12/2024 1325       RADIOGRAPHIC STUDIES: VAS US  LOWER EXTREMITY VENOUS (DVT) Result Date: 09/22/2024  Lower Venous DVT Study Patient Name:  KOBIE WHIDBY  Date of Exam:   09/22/2024 Medical Rec #: [id number]     Accession #:    [id number] Date of Birth: 1971-01-21     Patient Gender: M Patient Age:   53 years Exam Location:  [name] Procedure:      VAS US  LOWER EXTREMITY VENOUS (DVT) Referring Phys: FONDA SHARPS --------------------------------------------------------------------------------   Indications: Pitting edema. Other Indications: Acute on chronic hypoxic respiratory failure (on 4-5 liters                    of oxygen  at home). Risk Factors: Stage IIIb non small cell lung cancer, status post palliative radiation. Chemotherapy and immunotherapy have been ceased. Limitations: Poor ultrasound/tissue interface and Multiple interruptions secondary to patient being given Lasix  immediately prior to study. Comparison       Prior negative bilateral LE venous studies done 08/22/24 and Study:           07/13/24 Performing Technologist: Alberta Lis RVS  Examination Guidelines: A complete evaluation includes B-mode imaging, spectral Doppler, color Doppler, and power Doppler as needed of all accessible portions of each vessel. Bilateral testing is considered an integral part of a complete examination. Limited examinations for reoccurring indications may be performed as noted. The reflux portion of the exam is performed with the patient in reverse Trendelenburg.  +---------+---------------+---------+-----------+----------+--------------+ RIGHT    CompressibilityPhasicitySpontaneityPropertiesThrombus Aging +---------+---------------+---------+-----------+----------+--------------+ CFV      Full           Yes      No                                  +---------+---------------+---------+-----------+----------+--------------+ SFJ      Full                                                        +---------+---------------+---------+-----------+----------+--------------+ FV Prox  Full           Yes      No                                  +---------+---------------+---------+-----------+----------+--------------+ FV Mid   Full                                                        +---------+---------------+---------+-----------+----------+--------------+ FV DistalFull                                                         +---------+---------------+---------+-----------+----------+--------------+ PFV      Full                                                        +---------+---------------+---------+-----------+----------+--------------+  POP      Full           Yes      No                                  +---------+---------------+---------+-----------+----------+--------------+ PTV      Full                                                        +---------+---------------+---------+-----------+----------+--------------+ PERO     Full                                                        +---------+---------------+---------+-----------+----------+--------------+   +---------+---------------+---------+-----------+----------+--------------+ LEFT     CompressibilityPhasicitySpontaneityPropertiesThrombus Aging +---------+---------------+---------+-----------+----------+--------------+ CFV      Full           Yes      No                                  +---------+---------------+---------+-----------+----------+--------------+ SFJ      Full                                                        +---------+---------------+---------+-----------+----------+--------------+ FV Prox  Full                                                        +---------+---------------+---------+-----------+----------+--------------+ FV Mid   Full                                                        +---------+---------------+---------+-----------+----------+--------------+ FV DistalFull                                                        +---------+---------------+---------+-----------+----------+--------------+ PFV      Full                                                        +---------+---------------+---------+-----------+----------+--------------+ POP      Full           Yes      No                                   +---------+---------------+---------+-----------+----------+--------------+  PTV      Full                                                        +---------+---------------+---------+-----------+----------+--------------+ PERO     Full                                                        +---------+---------------+---------+-----------+----------+--------------+     Summary: RIGHT: - No evidence of deep vein thrombosis in the lower extremity. No indirect evidence of obstruction proximal to the inguinal ligament.  - No cystic structure found in the popliteal fossa. Subcutaneous edema noted. Pulsatile waveforms throughout.  LEFT: - No evidence of deep vein thrombosis in the lower extremity. No indirect evidence of obstruction proximal to the inguinal ligament.  - No cystic structure found in the popliteal fossa. - Ultrasound characteristics of enlarged lymph nodes noted in the groin. Subcutaneous edema noted. Pulsatile waveforms throughout.  *See table(s) above for measurements and observations. Electronically signed by Gaile New MD on 09/22/2024 at 9:58:12 PM.    Final    DG CHEST PORT 1 VIEW Result Date: 09/21/2024 CLINICAL DATA:  Acute and chronic respiratory failure. EXAM: PORTABLE CHEST 1 VIEW COMPARISON:  09/21/2024 FINDINGS: Shallow inspiration. Cardiac enlargement. Right perihilar mass with scarring in the right mid lung likely representing post treatment changes with history of lung cancer. Appearances are similar to prior studies. Mild interstitial changes in the left lung base, likely fibrosis, unchanged. No pleural effusion or pneumothorax. Tortuous aorta. IMPRESSION: Unchanged right perihilar mass and scarring consistent with post treatment changes. Interstitial changes in the left base, likely fibrosis also unchanged. Cardiac enlargement. Electronically Signed   By: Elsie Gravely M.D.   On: 09/21/2024 20:36   ECHOCARDIOGRAM COMPLETE BUBBLE STUDY Result Date: 09/21/2024     ECHOCARDIOGRAM REPORT   Patient Name:   VIYAN ROSAMOND Date of Exam: 09/21/2024 Medical Rec #:  [id number]    Height:       69.0 in Accession #:    [id number]   Weight:       224.9 lb Date of Birth:  [birth year]    BSA:          2.172 m Patient Age:    53 years     BP:           168/117 mmHg Patient Gender: M            HR:           113 bpm. Exam Location:  Inpatient Procedure: 2D Echo and Saline Contrast Bubble Study (Both Spectral and Color            Flow Doppler were utilized during procedure). STAT ECHO Indications:    dyspnea  History:        Patient has prior history of Echocardiogram examinations, most                 recent 05/19/2024. COPD and lung cancer, Signs/Symptoms:Shortness                 of Breath and Edema; Risk Factors:Hypertension and Current  Smoker.  Sonographer:    Tinnie Barefoot RDCS Referring Phys: [id number] MARSHA ADA IMPRESSIONS  1. Left ventricular ejection fraction, by estimation, is 70 to 75%. The left ventricle has hyperdynamic function. The left ventricle has no regional wall motion abnormalities. There is moderate concentric left ventricular hypertrophy. Left ventricular diastolic parameters are consistent with Grade I diastolic dysfunction (impaired relaxation). There is the interventricular septum is flattened in systole, consistent with right ventricular pressure overload.  2. Right ventricular systolic function is severely reduced. The right ventricular size is severely enlarged.  3. Right atrial size was mildly dilated.  4. A small pericardial effusion is present. The pericardial effusion is surrounding the apex.  5. The mitral valve is normal in structure. Trivial mitral valve regurgitation. No evidence of mitral stenosis.  6. The aortic valve is normal in structure. Aortic valve regurgitation is trivial. No aortic stenosis is present.  7. Aortic dilatation noted. There is borderline dilatation of the aortic root, measuring 39 mm. There is mild dilatation of the  ascending aorta, measuring 42 mm.  8. The inferior vena cava is normal in size with <50% respiratory variability, suggesting right atrial pressure of 8 mmHg.  9. Agitated saline contrast bubble study was negative, with no evidence of any interatrial shunt. FINDINGS  Left Ventricle: Left ventricular ejection fraction, by estimation, is 70 to 75%. The left ventricle has hyperdynamic function. The left ventricle has no regional wall motion abnormalities. The left ventricular internal cavity size was normal in size. There is moderate concentric left ventricular hypertrophy. The interventricular septum is flattened in systole, consistent with right ventricular pressure overload. Left ventricular diastolic parameters are consistent with Grade I diastolic dysfunction (impaired relaxation). Right Ventricle: The right ventricular size is severely enlarged. No increase in right ventricular wall thickness. Right ventricular systolic function is severely reduced. Left Atrium: Left atrial size was normal in size. Right Atrium: Right atrial size was mildly dilated. Pericardium: A small pericardial effusion is present. The pericardial effusion is surrounding the apex. Mitral Valve: The mitral valve is normal in structure. Trivial mitral valve regurgitation. No evidence of mitral valve stenosis. Tricuspid Valve: The tricuspid valve is normal in structure. Tricuspid valve regurgitation is mild . No evidence of tricuspid stenosis. Aortic Valve: The aortic valve is normal in structure. Aortic valve regurgitation is trivial. No aortic stenosis is present. Pulmonic Valve: The pulmonic valve was normal in structure. Pulmonic valve regurgitation is trivial. No evidence of pulmonic stenosis. Aorta: The aortic root is normal in size and structure and aortic dilatation noted. There is borderline dilatation of the aortic root, measuring 39 mm. There is mild dilatation of the ascending aorta, measuring 42 mm. Venous: The inferior vena cava is  normal in size with less than 50% respiratory variability, suggesting right atrial pressure of 8 mmHg. IAS/Shunts: No atrial level shunt detected by color flow Doppler. Agitated saline contrast was given intravenously to evaluate for intracardiac shunting. Agitated saline contrast bubble study was negative, with no evidence of any interatrial shunt.  LEFT VENTRICLE PLAX 2D LVIDd:         4.20 cm LVIDs:         2.80 cm LV PW:         1.10 cm LV IVS:        1.30 cm LVOT diam:     2.40 cm LV SV:         70 LV SV Index:   32 LVOT Area:     4.52 cm  RIGHT  VENTRICLE RV Basal diam:  3.00 cm RV S prime:     13.60 cm/s TAPSE (M-mode): 1.4 cm LEFT ATRIUM             Index        RIGHT ATRIUM           Index LA diam:        2.80 cm 1.29 cm/m   RA Area:     15.80 cm LA Vol (A2C):   55.7 ml 25.65 ml/m  RA Volume:   43.50 ml  20.03 ml/m LA Vol (A4C):   34.7 ml 15.98 ml/m LA Biplane Vol: 45.7 ml 21.05 ml/m  AORTIC VALVE LVOT Vmax:   101.00 cm/s LVOT Vmean:  67.700 cm/s LVOT VTI:    0.155 m  AORTA Ao Root diam: 3.90 cm Ao Asc diam:  4.20 cm  SHUNTS Systemic VTI:  0.16 m Systemic Diam: 2.40 cm Toribio Fuel MD Electronically signed by Toribio Fuel MD Signature Date/Time: 09/21/2024/3:41:00 PM    Final    DG Chest Portable 1 View Result Date: 09/21/2024 CLINICAL DATA:  Dyspnea EXAM: PORTABLE CHEST 1 VIEW COMPARISON:  09/11/2024 FINDINGS: Masslike consolidation within the right hilum representing post radiation changes better appreciated on CT examination of 09/11/2024, right-sided volume loss, and moderate right pleural effusion are unchanged. Known retrocardiac mass within the left lower lobe seen on prior CT examination is not well appreciated on the current exam. Left lung is clear. No pneumothorax. Cardiac size within limits. IMPRESSION: 1. Stable post radiation changes within the right hilum, right-sided volume loss, and moderate right pleural effusion. 2. Known left lower lobe mass is not well appreciated on the  current exam. There in Electronically Signed   By: Dorethia Molt M.D.   On: 09/21/2024 04:01   CT Angio Chest PE W/Cm &/Or Wo Cm Result Date: 09/11/2024 CLINICAL DATA:  Chronic dyspnea, worse over the past few days. History of lung cancer, COPD and CHF. Smoker. EXAM: CT ANGIOGRAPHY CHEST WITH CONTRAST TECHNIQUE: Multidetector CT imaging of the chest was performed using the standard protocol during bolus administration of intravenous contrast. Multiplanar CT image reconstructions and MIPs were obtained to evaluate the vascular anatomy. RADIATION DOSE REDUCTION: This exam was performed according to the departmental dose-optimization program which includes automated exposure control, adjustment of the mA and/or kV according to patient size and/or use of iterative reconstruction technique. CONTRAST:  75mL OMNIPAQUE  IOHEXOL  350 MG/ML SOLN COMPARISON:  06/15/2024 and chest radiographs dated 09/11/2024 and 08/21/2024. FINDINGS: Cardiovascular: Normally opacified pulmonary arteries with no pulmonary arterial filling defects seen. Stable enlarged heart and small pericardial effusion measuring up to 1 cm in thickness. Stable aneurysmal dilatation of the ascending thoracic aorta, currently measuring 4.3 cm in maximum diameter. Stable enlarged central pulmonary arteries with a main pulmonary artery diameter of 3.7 cm. Mediastinum/Nodes: No enlarged mediastinal, hilar, or axillary lymph nodes. Thyroid  gland, trachea, and esophagus demonstrate no significant findings. Lungs/Pleura: Interval moderate-sized right pleural effusion increased probable postradiation changes/atelectasis in the medial right upper lobe and right hilar region with associated diffuse bronchial narrowing in those areas. Increased size of a mass-like density in the medial left lower lobe. This currently measures 3.7 x 2.2 cm on image number 78/6, previously 1.6 x 1.4 cm on 06/15/2024 and 1.3 x 1.1 cm on 05/18/2024. Upper Abdomen: Stable previously  characterized left adrenal adenoma. Musculoskeletal: Thoracic spine degenerative changes. No evidence of bony metastatic disease. Review of the MIP images confirms the above findings. IMPRESSION: 1. No pulmonary  emboli. 2. Increased size of a mass-like density in the medial left lower lobe, currently measuring 3.7 x 2.2 cm, previously 1.6 x 1.4 cm on 06/15/2024 and 1.3 x 1.1 cm on 05/18/2024. This is suspicious for an enlarging lung carcinoma. Recommend further evaluation with elective outpatient PET-CT. 3. Increased probable postradiation changes/atelectasis in the medial right upper lobe and right hilar region with associated diffuse bronchial narrowing in those areas. 4. Interval moderate-sized right pleural effusion. 5. Stable cardiomegaly and small pericardial effusion. 6. Stable 4.3 cm ascending thoracic aortic aneurysm. 7. Stable enlarged central pulmonary arteries, compatible with pulmonary arterial hypertension. 8. Stable previously characterized left adrenal adenoma. Electronically Signed   By: Elspeth Bathe M.D.   On: 09/11/2024 17:07   DG Chest 1 View Result Date: 09/11/2024 CLINICAL DATA:  Shortness of breath EXAM: CHEST  1 VIEW COMPARISON:  08/21/2024, CT 06/15/2024, 12/17/2023, 09/16/2023, 10/16/2023 chest x-ray 18 2025, 01/15/2023 FINDINGS: Volume loss right thorax. Distortion and right hilar opacity corresponding to known lung mass and treatment changes. Bandlike density extending to the right upper lung pleural surface. Stable cardiomediastinal silhouette. No pneumothorax. Possible small right pleural effusion IMPRESSION: Volume loss right thorax with distortion and right hilar opacity corresponding to known lung mass and treatment changes. Possible small right pleural effusion. Electronically Signed   By: Luke Bun M.D.   On: 09/11/2024 15:45    ASSESSMENT AND PLAN: This is a very pleasant 53 years old African-American male with stage IIIb (T4, N2, M0) non-small cell lung cancer, poorly  differentiated carcinoma with neuroendocrine features diagnosed in January 2024 and presented with large right lower lobe lung mass in addition to right hilar and mediastinal lymphadenopathy.  He is status post short course of palliative radiotherapy with 30 Elnor completed in March 2023. He completed concurrent chemoradiation with weekly carboplatin  for AUC of 2 1 paclitaxel  45 Mg/M2 status post 4 cycles.  He was noncompliant with his chemotherapy treatments and received only 3 out of the schedule 7 cycles.  Last cycle was given on March 15, 2023. His scan showed decrease in the size of the right upper lung retrohilar mass as well as decrease in the size of the AP window lymph nodes and no evidence for progression. The patient is currently on treatment with consolidation immunotherapy with Imfinzi  1500 Mg IV every 4 weeks.  He is status post 3 cycle. The patient has been off treatment since June 2024 because he missed a lot of appointments.  His treatment was discontinued secondary to noncompliance. He has been on observation for more than a year now.  He had repeat CT scan of the chest performed few weeks ago.  Unfortunately his scan showed concerning finding for disease progression. Assessment and Plan Assessment & Plan Stage 3B non-small cell lung cancer, left lower lobe with suspected progression Recent CT scan shows increased size of mass-like density in the medial left lower lobe, now measuring 3.7 x 2.2 cm, previously 1.6 x 1.4 cm, indicating potential tumor growth. Differential diagnosis includes tumor progression versus inflammation. Requires further evaluation with PET scan to confirm disease activity. - Order PET scan to assess disease activity in the left lower lobe. - Schedule follow-up appointment within one week after PET scan to discuss results and potential treatment options.  Unintentional weight loss Significant unintentional weight loss from 225 lbs to 189 lbs. Possible contributing  factors include decreased appetite and increased physical activity. The patient was advised to call immediately if he has any concerning symptoms in the  interval.  The patient voices understanding of current disease status and treatment options and is in agreement with the current care plan.  All questions were answered. The patient knows to call the clinic with any problems, questions or concerns. We can certainly see the patient much sooner if necessary.  The total time spent in the appointment was 30 minutes.  Disclaimer: This note was dictated with voice recognition software. Similar sounding words can inadvertently be transcribed and may not be corrected upon review.

## 2024-10-16 ENCOUNTER — Encounter (HOSPITAL_COMMUNITY): Admission: RE | Admit: 2024-10-16

## 2024-10-23 ENCOUNTER — Ambulatory Visit (HOSPITAL_COMMUNITY): Admission: RE | Admit: 2024-10-23 | Source: Ambulatory Visit

## 2024-10-24 ENCOUNTER — Telehealth: Payer: Self-pay | Admitting: *Deleted

## 2024-10-24 NOTE — Telephone Encounter (Signed)
 Spoke with Richard [name]. and informed him that he missed his PET scan 10/23/24 Richard [name]. states he was not aware. Will cancel appt. with Dr. Sherrod and get him rescheduled for PET Scan, labs, and Dr. Visit. Message routed to scheduling.

## 2024-10-26 ENCOUNTER — Inpatient Hospital Stay: Admitting: Internal Medicine

## 2024-10-29 ENCOUNTER — Emergency Department (HOSPITAL_COMMUNITY)

## 2024-10-29 ENCOUNTER — Encounter (HOSPITAL_COMMUNITY): Payer: Self-pay

## 2024-10-29 ENCOUNTER — Emergency Department (HOSPITAL_COMMUNITY)
Admission: EM | Admit: 2024-10-29 | Discharge: 2024-10-29 | Discharge status: Against Medical Advice | Attending: Emergency Medicine | Admitting: Emergency Medicine

## 2024-10-29 DIAGNOSIS — Z85118 Personal history of other malignant neoplasm of bronchus and lung: Secondary | ICD-10-CM | POA: Insufficient documentation

## 2024-10-29 DIAGNOSIS — R600 Localized edema: Secondary | ICD-10-CM | POA: Insufficient documentation

## 2024-10-29 DIAGNOSIS — R0602 Shortness of breath: Secondary | ICD-10-CM | POA: Diagnosis present

## 2024-10-29 DIAGNOSIS — J9601 Acute respiratory failure with hypoxia: Secondary | ICD-10-CM | POA: Insufficient documentation

## 2024-10-29 DIAGNOSIS — Z5329 Procedure and treatment not carried out because of patient's decision for other reasons: Secondary | ICD-10-CM | POA: Insufficient documentation

## 2024-10-29 DIAGNOSIS — I110 Hypertensive heart disease with heart failure: Secondary | ICD-10-CM | POA: Insufficient documentation

## 2024-10-29 DIAGNOSIS — I509 Heart failure, unspecified: Secondary | ICD-10-CM | POA: Insufficient documentation

## 2024-10-29 DIAGNOSIS — Z7982 Long term (current) use of aspirin: Secondary | ICD-10-CM | POA: Insufficient documentation

## 2024-10-29 DIAGNOSIS — J441 Chronic obstructive pulmonary disease with (acute) exacerbation: Secondary | ICD-10-CM | POA: Insufficient documentation

## 2024-10-29 HISTORY — DX: Heart failure, unspecified: I50.9

## 2024-10-29 HISTORY — DX: Atherosclerotic heart disease of native coronary artery without angina pectoris: I25.10

## 2024-10-29 LAB — CBC WITH DIFFERENTIAL/PLATELET
Abs Immature Granulocytes: 0.03 K/uL (ref 0.00–0.07)
Basophils Absolute: 0 K/uL (ref 0.0–0.1)
Basophils Relative: 0 %
Eosinophils Absolute: 0 K/uL (ref 0.0–0.5)
Eosinophils Relative: 0 %
HCT: 51.6 % (ref 39.0–52.0)
Hemoglobin: 16.7 g/dL (ref 13.0–17.0)
Immature Granulocytes: 0 %
Lymphocytes Relative: 6 %
Lymphs Abs: 0.4 K/uL — ABNORMAL LOW (ref 0.7–4.0)
MCH: 31.7 pg (ref 26.0–34.0)
MCHC: 32.4 g/dL (ref 30.0–36.0)
MCV: 97.9 fL (ref 80.0–100.0)
Monocytes Absolute: 0.7 K/uL (ref 0.1–1.0)
Monocytes Relative: 10 %
Neutro Abs: 5.7 K/uL (ref 1.7–7.7)
Neutrophils Relative %: 84 %
Platelets: 250 K/uL (ref 150–400)
RBC: 5.27 MIL/uL (ref 4.22–5.81)
RDW: 17.4 % — ABNORMAL HIGH (ref 11.5–15.5)
WBC: 6.8 K/uL (ref 4.0–10.5)
nRBC: 0.3 % — ABNORMAL HIGH (ref 0.0–0.2)

## 2024-10-29 LAB — I-STAT VENOUS BLOOD GAS, ED
Acid-base deficit: 1 mmol/L (ref 0.0–2.0)
Bicarbonate: 26.3 mmol/L (ref 20.0–28.0)
Calcium, Ion: 1.17 mmol/L (ref 1.15–1.40)
HCT: 53 % — ABNORMAL HIGH (ref 39.0–52.0)
Hemoglobin: 18 g/dL — ABNORMAL HIGH (ref 13.0–17.0)
O2 Saturation: 46 %
Potassium: 4.2 mmol/L (ref 3.5–5.1)
Sodium: 141 mmol/L (ref 135–145)
TCO2: 28 mmol/L (ref 22–32)
pCO2, Ven: 49.5 mmHg (ref 44–60)
pH, Ven: 7.334 (ref 7.25–7.43)
pO2, Ven: 27 mmHg — CL (ref 32–45)

## 2024-10-29 LAB — BASIC METABOLIC PANEL WITH GFR
Anion gap: 14 (ref 5–15)
BUN: 10 mg/dL (ref 6–20)
CO2: 23 mmol/L (ref 22–32)
Calcium: 8.9 mg/dL (ref 8.9–10.3)
Chloride: 103 mmol/L (ref 98–111)
Creatinine, Ser: 0.85 mg/dL (ref 0.61–1.24)
GFR, Estimated: >60 mL/min (ref >60)
Glucose, Bld: 89 mg/dL (ref 70–99)
Potassium: 4.1 mmol/L (ref 3.5–5.1)
Sodium: 140 mmol/L (ref 135–145)

## 2024-10-29 LAB — D-DIMER, QUANTITATIVE: D-Dimer, Quant: 2.15 ug{FEU}/mL — ABNORMAL HIGH (ref 0.00–0.50)

## 2024-10-29 LAB — TROPONIN I (HIGH SENSITIVITY)
Troponin I (High Sensitivity): 17 ng/L (ref <18)
Troponin I (High Sensitivity): 17 ng/L (ref <18)

## 2024-10-29 LAB — BRAIN NATRIURETIC PEPTIDE: B Natriuretic Peptide: 341.3 pg/mL — ABNORMAL HIGH (ref 0.0–100.0)

## 2024-10-29 MED ORDER — IPRATROPIUM-ALBUTEROL 0.5-2.5 (3) MG/3ML IN SOLN
3.0000 mL | Freq: Once | RESPIRATORY_TRACT | Status: AC
  Administered 2024-10-29: 3 mL via RESPIRATORY_TRACT
  Filled 2024-10-29: qty 3

## 2024-10-29 MED ORDER — METHYLPREDNISOLONE SODIUM SUCC 125 MG IJ SOLR
125.0000 mg | Freq: Once | INTRAMUSCULAR | Status: AC
  Administered 2024-10-29: 125 mg via INTRAVENOUS
  Filled 2024-10-29: qty 2

## 2024-10-29 MED ORDER — FUROSEMIDE 10 MG/ML IJ SOLN
80.0000 mg | Freq: Once | INTRAMUSCULAR | Status: AC
  Administered 2024-10-29: 80 mg via INTRAVENOUS
  Filled 2024-10-29: qty 8

## 2024-10-29 MED ORDER — IOHEXOL 350 MG/ML SOLN: 75.0000 mL | Freq: Once | INTRAVENOUS | Status: DC | PRN

## 2024-10-29 MED ORDER — IPRATROPIUM-ALBUTEROL 0.5-2.5 (3) MG/3ML IN SOLN
RESPIRATORY_TRACT | Status: AC
  Filled 2024-10-29: qty 3

## 2024-10-29 NOTE — ED Provider Notes (Signed)
  EMERGENCY DEPARTMENT AT [facility] Provider Note   CSN: [id number] Arrival date & time: 10/29/24  8397     Patient presents with: Shortness of Breath and Leg Swelling   Richard [name] is a 53 y.o. male.  {Add pertinent medical, surgical, social history, OB history to YEP:67052} Patient is a 53 year old male with past medical history of hypertension, CVA, CHF, COPD, active smoker, stage IIIb (T4, N2, M0) non-small cell lung cancer, poorly differentiated carcinoma with neuroendocrine features diagnosed in January 2024 with palliative radiotherapy completed in 2023 presenting for complaints of difficulty breathing.  Patient states he is unsure of his current weight but states his last weight upon discharge was 225 pounds.  He admits to worsening of his chronic lower extremity bilateral swelling.  He admits to actively smoking and increasing his home oxygen  requirements.  He admits to wheezing and chest tightness.  Denies hemoptysis.  Denies any fevers, chills, nasal congestion, rhinorrhea, or sore throat.    Last echocardiogram was this year December 2025 with an EF of 70 to 75% with a hyperdynamic left ventricular function  The history is provided by the patient. No language interpreter was used.  Shortness of Breath Associated symptoms: wheezing   Associated symptoms: no abdominal pain, no chest pain, no cough, no ear pain, no fever, no rash, no sore throat and no vomiting        Prior to Admission medications   Medication Sig Start Date End Date Taking? Authorizing Provider  albuterol  (PROVENTIL ) (2.5 MG/3ML) 0.083% nebulizer solution Take 3 mLs (2.5 mg total) by nebulization 3 (three) times daily as needed for shortness of breath or wheezing. 09/25/24   Fairy Frames, MD  albuterol  (VENTOLIN  HFA) 108 [phone] Base) MCG/ACT inhaler Inhale 2 puffs into the lungs every 4 (four) hours as needed for wheezing or shortness of breath. 07/31/24   Cottie Donnice PARAS, MD  aspirin   81 MG chewable tablet Chew 81 mg by mouth daily.    [provider]  cloNIDine  (CATAPRES ) 0.1 MG tablet Take 1 tablet (0.1 mg total) by mouth daily. Take 2 tablets (0.2mg ) by mouth in the morning and evening (0900 and 1700) and take 1 tablet (0.1mg ) at bedtime. 09/25/24   Fairy Frames, MD  furosemide  (LASIX ) 40 MG tablet Take 1 tablet (40 mg total) by mouth daily. Patient taking differently: Take 40 mg by mouth 2 (two) times daily. 05/22/24 10/21/24  [place], Camellia PARAS, DO  levETIRAcetam  (KEPPRA ) 500 MG tablet Take 1 tablet (500 mg total) by mouth 2 (two) times daily. 01/17/23   Norrine Sharper, MD  losartan  (COZAAR ) 100 MG tablet Take 100 mg by mouth daily. 12/31/22   [provider]  sertraline  (ZOLOFT ) 25 MG tablet Take 25 mg by mouth daily. 07/23/23   [provider]  spironolactone (ALDACTONE) 25 MG tablet Take 1 tablet (25 mg total) by mouth daily. 09/25/24   Fairy Frames, MD  TRELEGY ELLIPTA  100-62.5-25 MCG/ACT AEPB Inhale 1 puff into the lungs daily. 07/05/24   [provider]    Allergies: Dilaudid  [hydromorphone  hcl] and Tape    Review of Systems  Constitutional:  Negative for chills and fever.  HENT:  Negative for ear pain and sore throat.   Eyes:  Negative for pain and visual disturbance.  Respiratory:  Positive for chest tightness, shortness of breath and wheezing. Negative for cough.   Cardiovascular:  Positive for leg swelling. Negative for chest pain and palpitations.  Gastrointestinal:  Negative for abdominal pain and  vomiting.  Genitourinary:  Negative for dysuria and hematuria.  Musculoskeletal:  Negative for arthralgias and back pain.  Skin:  Negative for color change and rash.  Neurological:  Negative for seizures and syncope.  All other systems reviewed and are negative.   Updated Vital Signs BP (!) 147/117   Pulse (!) 109   Temp 98.1 F (36.7 C) (Oral)   Resp 17   Ht 5' 9 (1.753 m)   Wt 86.2 kg   SpO2 95%   BMI 28.06 kg/m    Physical Exam Vitals and nursing note reviewed.  Constitutional:      General: He is not in acute distress.    Appearance: He is well-developed.  HENT:     Head: Normocephalic and atraumatic.  Eyes:     Conjunctiva/sclera: Conjunctivae normal.  Cardiovascular:     Rate and Rhythm: Normal rate and regular rhythm.     Heart sounds: No murmur heard. Pulmonary:     Effort: Tachypnea, accessory muscle usage and respiratory distress present.     Breath sounds: Examination of the right-upper field reveals wheezing. Examination of the left-upper field reveals wheezing. Examination of the right-middle field reveals wheezing. Examination of the left-middle field reveals wheezing. Examination of the right-lower field reveals wheezing. Examination of the left-lower field reveals wheezing. Wheezing present.  Abdominal:     Palpations: Abdomen is soft.     Tenderness: There is no abdominal tenderness.  Musculoskeletal:        General: No swelling.     Cervical back: Neck supple.     Right lower leg: 4+ Pitting Edema present.     Left lower leg: 4+ Pitting Edema present.  Skin:    General: Skin is warm and dry.     Capillary Refill: Capillary refill takes less than 2 seconds.  Neurological:     Mental Status: He is alert.  Psychiatric:        Mood and Affect: Mood normal.     (all labs ordered are listed, but only abnormal results are displayed) Labs Reviewed  CBC WITH DIFFERENTIAL/PLATELET  BASIC METABOLIC PANEL WITH GFR  BRAIN NATRIURETIC PEPTIDE  D-DIMER, QUANTITATIVE  I-STAT VENOUS BLOOD GAS, ED  TROPONIN I (HIGH SENSITIVITY)    EKG: None  Radiology: No results found.  {Document cardiac monitor, telemetry assessment procedure when appropriate:32947} Procedures   Medications Ordered in the ED  methylPREDNISolone  sodium succinate (SOLU-MEDROL ) 125 mg/2 mL injection 125 mg (has no administration in time range)  ipratropium-albuterol  (DUONEB) 0.5-2.5 (3) MG/3ML nebulizer  solution 3 mL (has no administration in time range)      {Click here for ABCD2, HEART and other calculators REFRESH Note before signing:1}                              Medical Decision Making Amount and/or Complexity of Data Reviewed Labs: ordered. Radiology: ordered.  Risk Prescription drug management.   53 year old male with past medical history of hypertension, CVA, CHF, COPD, active smoker, stage IIIb (T4, N2, M0) non-small cell lung cancer, poorly differentiated carcinoma with neuroendocrine features diagnosed in January 2024 with palliative radiotherapy completed in 2023 presenting for complaints of difficulty breathing.    On exam patient is in acute respiratory distress, tachypneic, tachycardic, accessory respiratory muscle use, diminished throughout with wheezing throughout.  Bilateral lower extremity pitting edema +4.  Differential diagnosis includes but is not limited to acute on chronic CHF exacerbation, COPD exacerbation,  {Document  critical care time when appropriate  Document review of labs and clinical decision tools ie CHADS2VASC2, etc  Document your independent review of radiology images and any outside records  Document your discussion with family members, caretakers and with consultants  Document social determinants of health affecting pt's care  Document your decision making why or why not admission, treatments were needed:32947:::1}   Final diagnoses:  None    ED Discharge Orders     None

## 2024-10-29 NOTE — ED Notes (Signed)
 New blue and light green sent to lab.

## 2024-10-29 NOTE — ED Triage Notes (Signed)
 Per EMS, Pt, from home, c/o increasing SOB and BLE swelling.  Denies pain.  Pt reports he has not eaten x3 days.      EMS reports they needed to put Pt on 15L [place].  Pt is normally on 5-6L [place].

## 2024-10-29 NOTE — ED Notes (Signed)
 ED Provider at bedside.

## 2024-10-29 NOTE — Discharge Instructions (Addendum)
 Please return to ER immediately if symptoms worsen See your cardiologist as soon as possible.  Double your home lasix  dose for the next two days.

## 2024-10-29 NOTE — ED Notes (Signed)
 Pt given a sandwich bag, crackers x4, peanut butter x3, and a Sprite per request and w/ EDP permission.

## 2024-10-29 NOTE — ED Notes (Signed)
 Pt is refusing all care and stating he needs a phone so he can call his wife/a ride.  Sts he only needs to eat because he hasn't eaten in 3 days.  Pt sts he hasn't eaten because he doesn't like anything at his house.  Pt upset we need him to wait to eat until a doctor sees him.  This writer encouraged the Pt to stay for treatment, but he continues to state he wants to leave.  Pt provided a phone and EDP made aware.

## 2024-10-29 NOTE — ED Notes (Signed)
 X-ray at bedside.

## 2024-10-29 NOTE — ED Notes (Signed)
 Patient asking to leave. Provider at bedside to discuss leaving AMA with patient. Patient reports he would like to leave against medical advice. AMA paperwork signed by patient and witnessed by this nurse. Patient given return instructions.

## 2024-10-30 ENCOUNTER — Encounter (HOSPITAL_COMMUNITY)
Admission: RE | Admit: 2024-10-30 | Discharge: 2024-10-30 | Disposition: A | Discharge status: Home / Self Care | Source: Ambulatory Visit | Attending: Internal Medicine | Admitting: Internal Medicine

## 2024-10-30 DIAGNOSIS — C3490 Malignant neoplasm of unspecified part of unspecified bronchus or lung: Secondary | ICD-10-CM | POA: Insufficient documentation

## 2024-11-08 ENCOUNTER — Ambulatory Visit (HOSPITAL_COMMUNITY): Admission: RE | Admit: 2024-11-08 | Source: Ambulatory Visit

## 2024-11-08 ENCOUNTER — Encounter (HOSPITAL_COMMUNITY): Payer: Self-pay

## 2024-11-08 ENCOUNTER — Inpatient Hospital Stay: Attending: Internal Medicine

## 2024-11-14 ENCOUNTER — Inpatient Hospital Stay (HOSPITAL_COMMUNITY)
Admission: EM | Admit: 2024-11-14 | Discharge: 2024-12-14 | DRG: 870 | Disposition: E | Attending: Pulmonary Disease | Admitting: Pulmonary Disease

## 2024-11-14 ENCOUNTER — Other Ambulatory Visit: Payer: Self-pay

## 2024-11-14 ENCOUNTER — Emergency Department (HOSPITAL_COMMUNITY)

## 2024-11-14 ENCOUNTER — Encounter (HOSPITAL_COMMUNITY): Payer: Self-pay

## 2024-11-14 DIAGNOSIS — I110 Hypertensive heart disease with heart failure: Secondary | ICD-10-CM | POA: Diagnosis present

## 2024-11-14 DIAGNOSIS — F1010 Alcohol abuse, uncomplicated: Secondary | ICD-10-CM

## 2024-11-14 DIAGNOSIS — Z7982 Long term (current) use of aspirin: Secondary | ICD-10-CM

## 2024-11-14 DIAGNOSIS — R042 Hemoptysis: Secondary | ICD-10-CM | POA: Diagnosis present

## 2024-11-14 DIAGNOSIS — Z9911 Dependence on respirator [ventilator] status: Secondary | ICD-10-CM

## 2024-11-14 DIAGNOSIS — Z833 Family history of diabetes mellitus: Secondary | ICD-10-CM

## 2024-11-14 DIAGNOSIS — Y95 Nosocomial condition: Secondary | ICD-10-CM | POA: Diagnosis present

## 2024-11-14 DIAGNOSIS — E872 Acidosis, unspecified: Secondary | ICD-10-CM | POA: Diagnosis present

## 2024-11-14 DIAGNOSIS — I2699 Other pulmonary embolism without acute cor pulmonale: Secondary | ICD-10-CM | POA: Diagnosis not present

## 2024-11-14 DIAGNOSIS — E440 Moderate protein-calorie malnutrition: Secondary | ICD-10-CM | POA: Diagnosis present

## 2024-11-14 DIAGNOSIS — Z9981 Dependence on supplemental oxygen: Secondary | ICD-10-CM

## 2024-11-14 DIAGNOSIS — I82412 Acute embolism and thrombosis of left femoral vein: Secondary | ICD-10-CM | POA: Diagnosis present

## 2024-11-14 DIAGNOSIS — Y907 Blood alcohol level of 200-239 mg/100 ml: Secondary | ICD-10-CM | POA: Diagnosis present

## 2024-11-14 DIAGNOSIS — I5033 Acute on chronic diastolic (congestive) heart failure: Secondary | ICD-10-CM | POA: Diagnosis present

## 2024-11-14 DIAGNOSIS — Z515 Encounter for palliative care: Secondary | ICD-10-CM

## 2024-11-14 DIAGNOSIS — R410 Disorientation, unspecified: Secondary | ICD-10-CM | POA: Diagnosis present

## 2024-11-14 DIAGNOSIS — F17210 Nicotine dependence, cigarettes, uncomplicated: Secondary | ICD-10-CM | POA: Diagnosis present

## 2024-11-14 DIAGNOSIS — Z66 Do not resuscitate: Secondary | ICD-10-CM | POA: Diagnosis not present

## 2024-11-14 DIAGNOSIS — Z8673 Personal history of transient ischemic attack (TIA), and cerebral infarction without residual deficits: Secondary | ICD-10-CM

## 2024-11-14 DIAGNOSIS — R339 Retention of urine, unspecified: Secondary | ICD-10-CM | POA: Diagnosis not present

## 2024-11-14 DIAGNOSIS — J441 Chronic obstructive pulmonary disease with (acute) exacerbation: Secondary | ICD-10-CM | POA: Diagnosis present

## 2024-11-14 DIAGNOSIS — J690 Pneumonitis due to inhalation of food and vomit: Secondary | ICD-10-CM | POA: Diagnosis present

## 2024-11-14 DIAGNOSIS — J80 Acute respiratory distress syndrome: Secondary | ICD-10-CM | POA: Diagnosis present

## 2024-11-14 DIAGNOSIS — N179 Acute kidney failure, unspecified: Secondary | ICD-10-CM | POA: Diagnosis not present

## 2024-11-14 DIAGNOSIS — C3491 Malignant neoplasm of unspecified part of right bronchus or lung: Secondary | ICD-10-CM | POA: Diagnosis present

## 2024-11-14 DIAGNOSIS — J910 Malignant pleural effusion: Secondary | ICD-10-CM | POA: Diagnosis present

## 2024-11-14 DIAGNOSIS — I4891 Unspecified atrial fibrillation: Secondary | ICD-10-CM | POA: Diagnosis not present

## 2024-11-14 DIAGNOSIS — Z86718 Personal history of other venous thrombosis and embolism: Secondary | ICD-10-CM

## 2024-11-14 DIAGNOSIS — Z91199 Patient's noncompliance with other medical treatment and regimen due to unspecified reason: Secondary | ICD-10-CM

## 2024-11-14 DIAGNOSIS — R627 Adult failure to thrive: Secondary | ICD-10-CM | POA: Diagnosis present

## 2024-11-14 DIAGNOSIS — G928 Other toxic encephalopathy: Secondary | ICD-10-CM | POA: Diagnosis present

## 2024-11-14 DIAGNOSIS — J9621 Acute and chronic respiratory failure with hypoxia: Secondary | ICD-10-CM | POA: Diagnosis present

## 2024-11-14 DIAGNOSIS — R0602 Shortness of breath: Principal | ICD-10-CM

## 2024-11-14 DIAGNOSIS — Z59868 Other specified financial insecurity: Secondary | ICD-10-CM

## 2024-11-14 DIAGNOSIS — R6521 Severe sepsis with septic shock: Secondary | ICD-10-CM | POA: Diagnosis present

## 2024-11-14 DIAGNOSIS — Z9221 Personal history of antineoplastic chemotherapy: Secondary | ICD-10-CM

## 2024-11-14 DIAGNOSIS — A419 Sepsis, unspecified organism: Principal | ICD-10-CM | POA: Diagnosis present

## 2024-11-14 DIAGNOSIS — I2510 Atherosclerotic heart disease of native coronary artery without angina pectoris: Secondary | ICD-10-CM | POA: Diagnosis present

## 2024-11-14 DIAGNOSIS — E871 Hypo-osmolality and hyponatremia: Secondary | ICD-10-CM | POA: Diagnosis not present

## 2024-11-14 DIAGNOSIS — I469 Cardiac arrest, cause unspecified: Secondary | ICD-10-CM | POA: Diagnosis not present

## 2024-11-14 DIAGNOSIS — Y848 Other medical procedures as the cause of abnormal reaction of the patient, or of later complication, without mention of misadventure at the time of the procedure: Secondary | ICD-10-CM | POA: Diagnosis not present

## 2024-11-14 DIAGNOSIS — Z781 Physical restraint status: Secondary | ICD-10-CM

## 2024-11-14 DIAGNOSIS — Z5982 Transportation insecurity: Secondary | ICD-10-CM

## 2024-11-14 DIAGNOSIS — R7303 Prediabetes: Secondary | ICD-10-CM | POA: Diagnosis present

## 2024-11-14 DIAGNOSIS — J95851 Ventilator associated pneumonia: Secondary | ICD-10-CM | POA: Diagnosis not present

## 2024-11-14 DIAGNOSIS — Z79899 Other long term (current) drug therapy: Secondary | ICD-10-CM

## 2024-11-14 DIAGNOSIS — F10229 Alcohol dependence with intoxication, unspecified: Secondary | ICD-10-CM | POA: Diagnosis present

## 2024-11-14 DIAGNOSIS — E875 Hyperkalemia: Secondary | ICD-10-CM | POA: Diagnosis not present

## 2024-11-14 DIAGNOSIS — Z7951 Long term (current) use of inhaled steroids: Secondary | ICD-10-CM

## 2024-11-14 DIAGNOSIS — J151 Pneumonia due to Pseudomonas: Secondary | ICD-10-CM | POA: Diagnosis present

## 2024-11-14 LAB — CBC WITH DIFFERENTIAL/PLATELET
Abs Immature Granulocytes: 0.06 K/uL (ref 0.00–0.07)
Basophils Absolute: 0 K/uL (ref 0.0–0.1)
Basophils Relative: 0 %
Eosinophils Absolute: 0 K/uL (ref 0.0–0.5)
Eosinophils Relative: 0 %
HCT: 51.7 % (ref 39.0–52.0)
Hemoglobin: 16.4 g/dL (ref 13.0–17.0)
Immature Granulocytes: 1 %
Lymphocytes Relative: 6 %
Lymphs Abs: 0.5 K/uL — ABNORMAL LOW (ref 0.7–4.0)
MCH: 32.3 pg (ref 26.0–34.0)
MCHC: 31.7 g/dL (ref 30.0–36.0)
MCV: 101.8 fL — ABNORMAL HIGH (ref 80.0–100.0)
Monocytes Absolute: 0.5 K/uL (ref 0.1–1.0)
Monocytes Relative: 6 %
Neutro Abs: 7.3 K/uL (ref 1.7–7.7)
Neutrophils Relative %: 87 %
Platelets: 271 K/uL (ref 150–400)
RBC: 5.08 MIL/uL (ref 4.22–5.81)
RDW: 17 % — ABNORMAL HIGH (ref 11.5–15.5)
WBC: 8.4 K/uL (ref 4.0–10.5)
nRBC: 0 % (ref 0.0–0.2)

## 2024-11-14 LAB — I-STAT ARTERIAL BLOOD GAS, ED
Acid-base deficit: 8 mmol/L — ABNORMAL HIGH (ref 0.0–2.0)
Bicarbonate: 20.4 mmol/L (ref 20.0–28.0)
Calcium, Ion: 1.21 mmol/L (ref 1.15–1.40)
HCT: 50 % (ref 39.0–52.0)
Hemoglobin: 17 g/dL (ref 13.0–17.0)
O2 Saturation: 87 %
Patient temperature: 96.7
Potassium: 4.3 mmol/L (ref 3.5–5.1)
Sodium: 141 mmol/L (ref 135–145)
TCO2: 22 mmol/L (ref 22–32)
pCO2 arterial: 50.7 mmHg — ABNORMAL HIGH (ref 32–48)
pH, Arterial: 7.207 — ABNORMAL LOW (ref 7.35–7.45)
pO2, Arterial: 62 mmHg — ABNORMAL LOW (ref 83–108)

## 2024-11-14 LAB — BASIC METABOLIC PANEL WITH GFR
Anion gap: 8 (ref 5–15)
BUN: 13 mg/dL (ref 6–20)
CO2: 21 mmol/L — ABNORMAL LOW (ref 22–32)
Calcium: 7.7 mg/dL — ABNORMAL LOW (ref 8.9–10.3)
Chloride: 111 mmol/L (ref 98–111)
Creatinine, Ser: 0.99 mg/dL (ref 0.61–1.24)
GFR, Estimated: >60 mL/min (ref >60)
Glucose, Bld: 88 mg/dL (ref 70–99)
Potassium: 4.8 mmol/L (ref 3.5–5.1)
Sodium: 140 mmol/L (ref 135–145)

## 2024-11-14 LAB — BRAIN NATRIURETIC PEPTIDE: B Natriuretic Peptide: 350.9 pg/mL — ABNORMAL HIGH (ref 0.0–100.0)

## 2024-11-14 LAB — RESP PANEL BY RT-PCR (RSV, FLU A&B, COVID)  RVPGX2
Influenza A by PCR: NEGATIVE
Influenza B by PCR: NEGATIVE
Resp Syncytial Virus by PCR: NEGATIVE
SARS Coronavirus 2 by RT PCR: NEGATIVE

## 2024-11-14 LAB — ETHANOL: Alcohol, Ethyl (B): 235 mg/dL — ABNORMAL HIGH (ref <15)

## 2024-11-14 MED ORDER — LORAZEPAM 2 MG/ML IJ SOLN
2.0000 mg | Freq: Once | INTRAMUSCULAR | Status: AC
  Administered 2024-11-14: 2 mg via INTRAVENOUS
  Filled 2024-11-14: qty 1

## 2024-11-14 MED ORDER — ZIPRASIDONE MESYLATE 20 MG IM SOLR
10.0000 mg | Freq: Once | INTRAMUSCULAR | Status: AC
  Administered 2024-11-14: 10 mg via INTRAMUSCULAR
  Filled 2024-11-14: qty 20

## 2024-11-14 MED ORDER — FUROSEMIDE 10 MG/ML IJ SOLN
60.0000 mg | Freq: Once | INTRAMUSCULAR | Status: AC
  Administered 2024-11-14: 60 mg via INTRAVENOUS
  Filled 2024-11-14: qty 6

## 2024-11-14 NOTE — ED Notes (Signed)
 Pt grabbing nursing staff
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
## 2024-11-14 NOTE — ED Notes (Signed)
 Pt continues to be aggressive with nursing staff. Pt refusing oxygen  at this time
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
## 2024-11-14 NOTE — ED Notes (Signed)
 Pt sedated after IV meds given. HR on the 120's and RR fast on the 20's to 30's. Pt assessed by Dr. Garrick, will continue to monitor. Left arm release from restrain to assess for possible dc restrain.

## 2024-11-14 NOTE — ED Notes (Signed)
 IV'ED PT CAME IN IVC'D FIRST EXAM DONE COPIES MADE ORIGINAL IN THE RED FOLDER UNLOADED TO THE COURT OF CLERK PAPERWORK ATTACHED TO THE CLIPBOARD IN [place]
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
## 2024-11-14 NOTE — ED Notes (Signed)
 Pt states I dont need that you cracker ass bitch when he is offered oxygen 
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
## 2024-11-14 NOTE — ED Notes (Signed)
 Pt continues to be verbally aggressive with nursing staff.

## 2024-11-14 NOTE — ED Notes (Signed)
 Xray unable to scan pt due to combative behavior. RN will be made aware

## 2024-11-15 ENCOUNTER — Inpatient Hospital Stay (HOSPITAL_COMMUNITY)

## 2024-11-15 DIAGNOSIS — C3491 Malignant neoplasm of unspecified part of right bronchus or lung: Secondary | ICD-10-CM | POA: Diagnosis present

## 2024-11-15 DIAGNOSIS — F1010 Alcohol abuse, uncomplicated: Secondary | ICD-10-CM | POA: Diagnosis not present

## 2024-11-15 DIAGNOSIS — I5031 Acute diastolic (congestive) heart failure: Secondary | ICD-10-CM | POA: Diagnosis not present

## 2024-11-15 DIAGNOSIS — R042 Hemoptysis: Secondary | ICD-10-CM | POA: Diagnosis present

## 2024-11-15 DIAGNOSIS — J80 Acute respiratory distress syndrome: Secondary | ICD-10-CM | POA: Diagnosis present

## 2024-11-15 DIAGNOSIS — R531 Weakness: Secondary | ICD-10-CM | POA: Diagnosis not present

## 2024-11-15 DIAGNOSIS — G928 Other toxic encephalopathy: Secondary | ICD-10-CM | POA: Diagnosis present

## 2024-11-15 DIAGNOSIS — I5032 Chronic diastolic (congestive) heart failure: Secondary | ICD-10-CM | POA: Diagnosis not present

## 2024-11-15 DIAGNOSIS — I4891 Unspecified atrial fibrillation: Secondary | ICD-10-CM | POA: Diagnosis not present

## 2024-11-15 DIAGNOSIS — J9601 Acute respiratory failure with hypoxia: Secondary | ICD-10-CM | POA: Diagnosis not present

## 2024-11-15 DIAGNOSIS — G9340 Encephalopathy, unspecified: Secondary | ICD-10-CM | POA: Diagnosis not present

## 2024-11-15 DIAGNOSIS — R739 Hyperglycemia, unspecified: Secondary | ICD-10-CM | POA: Diagnosis not present

## 2024-11-15 DIAGNOSIS — J90 Pleural effusion, not elsewhere classified: Secondary | ICD-10-CM | POA: Diagnosis not present

## 2024-11-15 DIAGNOSIS — I82419 Acute embolism and thrombosis of unspecified femoral vein: Secondary | ICD-10-CM | POA: Diagnosis not present

## 2024-11-15 DIAGNOSIS — R410 Disorientation, unspecified: Secondary | ICD-10-CM | POA: Diagnosis not present

## 2024-11-15 DIAGNOSIS — J9602 Acute respiratory failure with hypercapnia: Secondary | ICD-10-CM | POA: Diagnosis not present

## 2024-11-15 DIAGNOSIS — B9561 Methicillin susceptible Staphylococcus aureus infection as the cause of diseases classified elsewhere: Secondary | ICD-10-CM | POA: Diagnosis not present

## 2024-11-15 DIAGNOSIS — N179 Acute kidney failure, unspecified: Secondary | ICD-10-CM | POA: Diagnosis not present

## 2024-11-15 DIAGNOSIS — I5030 Unspecified diastolic (congestive) heart failure: Secondary | ICD-10-CM | POA: Diagnosis not present

## 2024-11-15 DIAGNOSIS — F10920 Alcohol use, unspecified with intoxication, uncomplicated: Secondary | ICD-10-CM | POA: Diagnosis not present

## 2024-11-15 DIAGNOSIS — Z515 Encounter for palliative care: Secondary | ICD-10-CM | POA: Diagnosis not present

## 2024-11-15 DIAGNOSIS — G9389 Other specified disorders of brain: Secondary | ICD-10-CM | POA: Diagnosis not present

## 2024-11-15 DIAGNOSIS — M5030 Other cervical disc degeneration, unspecified cervical region: Secondary | ICD-10-CM | POA: Diagnosis not present

## 2024-11-15 DIAGNOSIS — R627 Adult failure to thrive: Secondary | ICD-10-CM | POA: Diagnosis not present

## 2024-11-15 DIAGNOSIS — J690 Pneumonitis due to inhalation of food and vomit: Secondary | ICD-10-CM | POA: Diagnosis present

## 2024-11-15 DIAGNOSIS — R609 Edema, unspecified: Secondary | ICD-10-CM | POA: Diagnosis not present

## 2024-11-15 DIAGNOSIS — I5033 Acute on chronic diastolic (congestive) heart failure: Secondary | ICD-10-CM | POA: Diagnosis present

## 2024-11-15 DIAGNOSIS — I110 Hypertensive heart disease with heart failure: Secondary | ICD-10-CM | POA: Diagnosis present

## 2024-11-15 DIAGNOSIS — C3490 Malignant neoplasm of unspecified part of unspecified bronchus or lung: Secondary | ICD-10-CM | POA: Diagnosis not present

## 2024-11-15 DIAGNOSIS — Z7189 Other specified counseling: Secondary | ICD-10-CM | POA: Diagnosis not present

## 2024-11-15 DIAGNOSIS — J9621 Acute and chronic respiratory failure with hypoxia: Secondary | ICD-10-CM | POA: Diagnosis not present

## 2024-11-15 DIAGNOSIS — I82409 Acute embolism and thrombosis of unspecified deep veins of unspecified lower extremity: Secondary | ICD-10-CM | POA: Diagnosis not present

## 2024-11-15 DIAGNOSIS — R578 Other shock: Secondary | ICD-10-CM | POA: Diagnosis not present

## 2024-11-15 DIAGNOSIS — I82412 Acute embolism and thrombosis of left femoral vein: Secondary | ICD-10-CM | POA: Diagnosis present

## 2024-11-15 DIAGNOSIS — J95851 Ventilator associated pneumonia: Secondary | ICD-10-CM | POA: Diagnosis not present

## 2024-11-15 DIAGNOSIS — R634 Abnormal weight loss: Secondary | ICD-10-CM | POA: Diagnosis not present

## 2024-11-15 DIAGNOSIS — J449 Chronic obstructive pulmonary disease, unspecified: Secondary | ICD-10-CM | POA: Diagnosis not present

## 2024-11-15 DIAGNOSIS — E440 Moderate protein-calorie malnutrition: Secondary | ICD-10-CM | POA: Diagnosis present

## 2024-11-15 DIAGNOSIS — R9082 White matter disease, unspecified: Secondary | ICD-10-CM | POA: Diagnosis not present

## 2024-11-15 DIAGNOSIS — J9622 Acute and chronic respiratory failure with hypercapnia: Secondary | ICD-10-CM | POA: Diagnosis not present

## 2024-11-15 DIAGNOSIS — F1020 Alcohol dependence, uncomplicated: Secondary | ICD-10-CM | POA: Diagnosis not present

## 2024-11-15 DIAGNOSIS — F10229 Alcohol dependence with intoxication, unspecified: Secondary | ICD-10-CM | POA: Diagnosis present

## 2024-11-15 DIAGNOSIS — E871 Hypo-osmolality and hyponatremia: Secondary | ICD-10-CM | POA: Diagnosis not present

## 2024-11-15 DIAGNOSIS — A419 Sepsis, unspecified organism: Secondary | ICD-10-CM | POA: Diagnosis present

## 2024-11-15 DIAGNOSIS — J441 Chronic obstructive pulmonary disease with (acute) exacerbation: Secondary | ICD-10-CM | POA: Diagnosis present

## 2024-11-15 DIAGNOSIS — R579 Shock, unspecified: Secondary | ICD-10-CM | POA: Diagnosis not present

## 2024-11-15 DIAGNOSIS — R509 Fever, unspecified: Secondary | ICD-10-CM | POA: Diagnosis not present

## 2024-11-15 DIAGNOSIS — R5081 Fever presenting with conditions classified elsewhere: Secondary | ICD-10-CM | POA: Diagnosis not present

## 2024-11-15 DIAGNOSIS — C3411 Malignant neoplasm of upper lobe, right bronchus or lung: Secondary | ICD-10-CM | POA: Diagnosis not present

## 2024-11-15 DIAGNOSIS — Z9911 Dependence on respirator [ventilator] status: Secondary | ICD-10-CM | POA: Diagnosis not present

## 2024-11-15 DIAGNOSIS — J158 Pneumonia due to other specified bacteria: Secondary | ICD-10-CM | POA: Diagnosis not present

## 2024-11-15 DIAGNOSIS — Y95 Nosocomial condition: Secondary | ICD-10-CM | POA: Diagnosis present

## 2024-11-15 DIAGNOSIS — E875 Hyperkalemia: Secondary | ICD-10-CM | POA: Diagnosis not present

## 2024-11-15 DIAGNOSIS — J45909 Unspecified asthma, uncomplicated: Secondary | ICD-10-CM | POA: Diagnosis not present

## 2024-11-15 DIAGNOSIS — Y848 Other medical procedures as the cause of abnormal reaction of the patient, or of later complication, without mention of misadventure at the time of the procedure: Secondary | ICD-10-CM | POA: Diagnosis not present

## 2024-11-15 DIAGNOSIS — I2699 Other pulmonary embolism without acute cor pulmonale: Secondary | ICD-10-CM | POA: Diagnosis not present

## 2024-11-15 DIAGNOSIS — E872 Acidosis, unspecified: Secondary | ICD-10-CM | POA: Diagnosis present

## 2024-11-15 DIAGNOSIS — R6521 Severe sepsis with septic shock: Secondary | ICD-10-CM | POA: Diagnosis present

## 2024-11-15 DIAGNOSIS — J910 Malignant pleural effusion: Secondary | ICD-10-CM | POA: Diagnosis present

## 2024-11-15 DIAGNOSIS — J189 Pneumonia, unspecified organism: Secondary | ICD-10-CM | POA: Diagnosis not present

## 2024-11-15 DIAGNOSIS — I469 Cardiac arrest, cause unspecified: Secondary | ICD-10-CM | POA: Diagnosis not present

## 2024-11-15 DIAGNOSIS — Y907 Blood alcohol level of 200-239 mg/100 ml: Secondary | ICD-10-CM | POA: Diagnosis present

## 2024-11-15 DIAGNOSIS — I2510 Atherosclerotic heart disease of native coronary artery without angina pectoris: Secondary | ICD-10-CM | POA: Diagnosis not present

## 2024-11-15 DIAGNOSIS — Z66 Do not resuscitate: Secondary | ICD-10-CM | POA: Diagnosis not present

## 2024-11-15 DIAGNOSIS — J151 Pneumonia due to Pseudomonas: Secondary | ICD-10-CM | POA: Diagnosis present

## 2024-11-15 DIAGNOSIS — J15211 Pneumonia due to Methicillin susceptible Staphylococcus aureus: Secondary | ICD-10-CM | POA: Diagnosis not present

## 2024-11-15 LAB — CBC
HCT: 48.3 % (ref 39.0–52.0)
Hemoglobin: 15.9 g/dL (ref 13.0–17.0)
MCH: 32.4 pg (ref 26.0–34.0)
MCHC: 32.9 g/dL (ref 30.0–36.0)
MCV: 98.6 fL (ref 80.0–100.0)
Platelets: 241 K/uL (ref 150–400)
RBC: 4.9 MIL/uL (ref 4.22–5.81)
RDW: 16.6 % — ABNORMAL HIGH (ref 11.5–15.5)
WBC: 8.2 K/uL (ref 4.0–10.5)
nRBC: 0 % (ref 0.0–0.2)

## 2024-11-15 LAB — HEPATIC FUNCTION PANEL
ALT: 12 U/L (ref 0–44)
AST: 21 U/L (ref 15–41)
Albumin: 3.5 g/dL (ref 3.5–5.0)
Alkaline Phosphatase: 75 U/L (ref 38–126)
Bilirubin, Direct: 0.1 mg/dL (ref 0.0–0.2)
Indirect Bilirubin: 0.6 mg/dL (ref 0.3–0.9)
Total Bilirubin: 0.7 mg/dL (ref 0.0–1.2)
Total Protein: 6.7 g/dL (ref 6.5–8.1)

## 2024-11-15 LAB — I-STAT ARTERIAL BLOOD GAS, ED
Acid-base deficit: 5 mmol/L — ABNORMAL HIGH (ref 0.0–2.0)
Bicarbonate: 22.5 mmol/L (ref 20.0–28.0)
Calcium, Ion: 1.2 mmol/L (ref 1.15–1.40)
HCT: 48 % (ref 39.0–52.0)
Hemoglobin: 16.3 g/dL (ref 13.0–17.0)
O2 Saturation: 94 %
Patient temperature: 36.7
Potassium: 4.4 mmol/L (ref 3.5–5.1)
Sodium: 140 mmol/L (ref 135–145)
TCO2: 24 mmol/L (ref 22–32)
pCO2 arterial: 50.6 mmHg — ABNORMAL HIGH (ref 32–48)
pH, Arterial: 7.254 — ABNORMAL LOW (ref 7.35–7.45)
pO2, Arterial: 82 mmHg — ABNORMAL LOW (ref 83–108)

## 2024-11-15 LAB — POCT I-STAT 7, (LYTES, BLD GAS, ICA,H+H)
Acid-base deficit: 2 mmol/L (ref 0.0–2.0)
Bicarbonate: 21.5 mmol/L (ref 20.0–28.0)
Calcium, Ion: 1.15 mmol/L (ref 1.15–1.40)
HCT: 49 % (ref 39.0–52.0)
Hemoglobin: 16.7 g/dL (ref 13.0–17.0)
O2 Saturation: 99 %
Patient temperature: 98.4
Potassium: 3.9 mmol/L (ref 3.5–5.1)
Sodium: 136 mmol/L (ref 135–145)
TCO2: 22 mmol/L (ref 22–32)
pCO2 arterial: 32.7 mmHg (ref 32–48)
pH, Arterial: 7.426 (ref 7.35–7.45)
pO2, Arterial: 146 mmHg — ABNORMAL HIGH (ref 83–108)

## 2024-11-15 LAB — URINALYSIS, COMPLETE (UACMP) WITH MICROSCOPIC
Bilirubin Urine: NEGATIVE
Glucose, UA: NEGATIVE mg/dL
Ketones, ur: NEGATIVE mg/dL
Nitrite: NEGATIVE
Protein, ur: NEGATIVE mg/dL
Specific Gravity, Urine: 1.012 (ref 1.005–1.030)
pH: 5 (ref 5.0–8.0)

## 2024-11-15 LAB — MAGNESIUM: Magnesium: 1.6 mg/dL — ABNORMAL LOW (ref 1.7–2.4)

## 2024-11-15 LAB — PROTEIN, PLEURAL OR PERITONEAL FLUID: Total protein, fluid: 3.9 g/dL

## 2024-11-15 LAB — PROCALCITONIN: Procalcitonin: 0.3 ng/mL

## 2024-11-15 LAB — SALICYLATE LEVEL: Salicylate Lvl: <7 mg/dL — ABNORMAL LOW (ref 7.0–30.0)

## 2024-11-15 LAB — RAPID URINE DRUG SCREEN, HOSP PERFORMED
Amphetamines: NOT DETECTED
Barbiturates: NOT DETECTED
Benzodiazepines: NOT DETECTED
Cocaine: NOT DETECTED
Opiates: NOT DETECTED
Tetrahydrocannabinol: NOT DETECTED

## 2024-11-15 LAB — MRSA NEXT GEN BY PCR, NASAL: MRSA by PCR Next Gen: NOT DETECTED

## 2024-11-15 LAB — BASIC METABOLIC PANEL WITH GFR
Anion gap: 11 (ref 5–15)
BUN: 13 mg/dL (ref 6–20)
CO2: 24 mmol/L (ref 22–32)
Calcium: 8.6 mg/dL — ABNORMAL LOW (ref 8.9–10.3)
Chloride: 106 mmol/L (ref 98–111)
Creatinine, Ser: 1.04 mg/dL (ref 0.61–1.24)
GFR, Estimated: >60 mL/min (ref >60)
Glucose, Bld: 74 mg/dL (ref 70–99)
Potassium: 4.4 mmol/L (ref 3.5–5.1)
Sodium: 141 mmol/L (ref 135–145)

## 2024-11-15 LAB — LACTATE DEHYDROGENASE, PLEURAL OR PERITONEAL FLUID: LD, Fluid: 150 U/L — ABNORMAL HIGH (ref 3–23)

## 2024-11-15 LAB — GLUCOSE, CAPILLARY
Glucose-Capillary: 119 mg/dL — ABNORMAL HIGH (ref 70–99)
Glucose-Capillary: 122 mg/dL — ABNORMAL HIGH (ref 70–99)
Glucose-Capillary: 133 mg/dL — ABNORMAL HIGH (ref 70–99)

## 2024-11-15 MED ORDER — ARFORMOTEROL TARTRATE 15 MCG/2ML IN NEBU
15.0000 ug | INHALATION_SOLUTION | Freq: Two times a day (BID) | RESPIRATORY_TRACT | Status: DC
  Administered 2024-11-15 – 2024-12-05 (×35): 15 ug via RESPIRATORY_TRACT
  Filled 2024-11-15 (×36): qty 2

## 2024-11-15 MED ORDER — PROPOFOL 1000 MG/100ML IV EMUL
0.0000 ug/kg/min | INTRAVENOUS | Status: DC
  Administered 2024-11-15: 35 ug/kg/min via INTRAVENOUS
  Administered 2024-11-15: 50 ug/kg/min via INTRAVENOUS
  Administered 2024-11-15: 20 ug/kg/min via INTRAVENOUS
  Administered 2024-11-15: 60 ug/kg/min via INTRAVENOUS
  Administered 2024-11-16 (×2): 35 ug/kg/min via INTRAVENOUS
  Filled 2024-11-15 (×6): qty 100

## 2024-11-15 MED ORDER — POLYETHYLENE GLYCOL 3350 17 G PO PACK
17.0000 g | PACK | Freq: Every day | ORAL | Status: DC
  Administered 2024-11-15 – 2024-11-17 (×3): 17 g
  Filled 2024-11-15 (×3): qty 1

## 2024-11-15 MED ORDER — PHENOBARBITAL SODIUM 130 MG/ML IJ SOLN: 130.0000 mg | Freq: Once | INTRAMUSCULAR | Status: DC | PRN

## 2024-11-15 MED ORDER — DOCUSATE SODIUM 50 MG/5ML PO LIQD
100.0000 mg | Freq: Two times a day (BID) | ORAL | Status: DC
  Administered 2024-11-15 – 2024-11-18 (×7): 100 mg
  Filled 2024-11-15 (×8): qty 10

## 2024-11-15 MED ORDER — ROCURONIUM BROMIDE 10 MG/ML (PF) SYRINGE: 80.0000 mg | PREFILLED_SYRINGE | Freq: Once | INTRAVENOUS | Status: AC

## 2024-11-15 MED ORDER — HALOPERIDOL LACTATE 5 MG/ML IJ SOLN: 5.0000 mg | Freq: Once | INTRAMUSCULAR | Status: DC

## 2024-11-15 MED ORDER — THIAMINE HCL 100 MG/ML IJ SOLN
500.0000 mg | INTRAVENOUS | Status: AC
  Administered 2024-11-15 – 2024-11-17 (×3): 500 mg via INTRAVENOUS
  Filled 2024-11-15 (×3): qty 5
  Filled 2024-11-15: qty 500

## 2024-11-15 MED ORDER — DOCUSATE SODIUM 100 MG PO CAPS: 100.0000 mg | ORAL_CAPSULE | Freq: Two times a day (BID) | ORAL | Status: DC | PRN

## 2024-11-15 MED ORDER — FOLIC ACID 1 MG PO TABS
1.0000 mg | ORAL_TABLET | Freq: Every day | ORAL | Status: DC
  Administered 2024-11-16 – 2024-12-05 (×20): 1 mg
  Filled 2024-11-15 (×20): qty 1

## 2024-11-15 MED ORDER — IPRATROPIUM-ALBUTEROL 0.5-2.5 (3) MG/3ML IN SOLN: 3.0000 mL | Freq: Four times a day (QID) | RESPIRATORY_TRACT | Status: DC

## 2024-11-15 MED ORDER — DEXMEDETOMIDINE HCL IN NACL 400 MCG/100ML IV SOLN: 0.0000 ug/kg/h | INTRAVENOUS | Status: DC

## 2024-11-15 MED ORDER — ROCURONIUM BROMIDE 10 MG/ML (PF) SYRINGE
PREFILLED_SYRINGE | INTRAVENOUS | Status: AC
  Administered 2024-11-15: 80 mg via INTRAVENOUS
  Filled 2024-11-15: qty 10

## 2024-11-15 MED ORDER — LORAZEPAM 2 MG/ML IJ SOLN: 1.0000 mg | INTRAMUSCULAR | Status: DC | PRN

## 2024-11-15 MED ORDER — IOHEXOL 350 MG/ML SOLN
75.0000 mL | Freq: Once | INTRAVENOUS | Status: AC | PRN
  Administered 2024-11-15: 75 mL via INTRAVENOUS

## 2024-11-15 MED ORDER — METHYLPREDNISOLONE SODIUM SUCC 125 MG IJ SOLR
125.0000 mg | INTRAMUSCULAR | Status: DC
  Filled 2024-11-15: qty 2

## 2024-11-15 MED ORDER — LEVETIRACETAM 500 MG PO TABS
500.0000 mg | ORAL_TABLET | Freq: Two times a day (BID) | ORAL | Status: DC
  Administered 2024-11-15 – 2024-12-05 (×41): 500 mg
  Filled 2024-11-15 (×41): qty 1

## 2024-11-15 MED ORDER — NOREPINEPHRINE 4 MG/250ML-% IV SOLN
INTRAVENOUS | Status: AC
  Administered 2024-11-15: 2 ug/min via INTRAVENOUS
  Filled 2024-11-15: qty 250

## 2024-11-15 MED ORDER — DEXMEDETOMIDINE HCL IN NACL 400 MCG/100ML IV SOLN
0.0000 ug/kg/h | INTRAVENOUS | Status: DC
  Administered 2024-11-15: 0.4 ug/kg/h via INTRAVENOUS
  Filled 2024-11-15: qty 100

## 2024-11-15 MED ORDER — FOLIC ACID 1 MG PO TABS
1.0000 mg | ORAL_TABLET | Freq: Every day | ORAL | Status: DC
  Administered 2024-11-15: 1 mg via ORAL
  Filled 2024-11-15: qty 1

## 2024-11-15 MED ORDER — DOCUSATE SODIUM 50 MG/5ML PO LIQD: 100.0000 mg | Freq: Two times a day (BID) | ORAL | Status: DC

## 2024-11-15 MED ORDER — PROSOURCE TF20 ENFIT COMPATIBL EN LIQD
60.0000 mL | Freq: Every day | ENTERAL | Status: DC
  Administered 2024-11-15 – 2024-11-16 (×2): 60 mL
  Filled 2024-11-15 (×2): qty 60

## 2024-11-15 MED ORDER — THIAMINE HCL 100 MG/ML IJ SOLN
100.0000 mg | INTRAMUSCULAR | Status: AC
  Administered 2024-11-18 – 2024-11-20 (×3): 100 mg via INTRAVENOUS
  Filled 2024-11-15 (×3): qty 2

## 2024-11-15 MED ORDER — ADULT MULTIVITAMIN W/MINERALS CH
1.0000 | ORAL_TABLET | Freq: Every day | ORAL | Status: DC
  Administered 2024-11-16 – 2024-12-05 (×20): 1
  Filled 2024-11-15 (×20): qty 1

## 2024-11-15 MED ORDER — VITAL HP 1.0 CAL PO LIQD
1000.0000 mL | ORAL | Status: DC
  Administered 2024-11-15: 1000 mL

## 2024-11-15 MED ORDER — SODIUM CHLORIDE 0.9 % IV SOLN
3.0000 g | Freq: Four times a day (QID) | INTRAVENOUS | Status: DC
  Administered 2024-11-15 (×2): 3 g via INTRAVENOUS
  Filled 2024-11-15 (×2): qty 8

## 2024-11-15 MED ORDER — INSULIN ASPART 100 UNIT/ML IJ SOLN
0.0000 [IU] | INTRAMUSCULAR | Status: DC
  Administered 2024-11-15 – 2024-11-16 (×2): 1 [IU] via SUBCUTANEOUS
  Administered 2024-11-16 (×2): 2 [IU] via SUBCUTANEOUS
  Administered 2024-11-16 (×3): 1 [IU] via SUBCUTANEOUS
  Administered 2024-11-17 (×2): 3 [IU] via SUBCUTANEOUS
  Administered 2024-11-17: 2 [IU] via SUBCUTANEOUS
  Administered 2024-11-17 (×3): 1 [IU] via SUBCUTANEOUS
  Administered 2024-11-18: 2 [IU] via SUBCUTANEOUS
  Administered 2024-11-18: 3 [IU] via SUBCUTANEOUS
  Administered 2024-11-18 (×2): 2 [IU] via SUBCUTANEOUS
  Administered 2024-11-18: 3 [IU] via SUBCUTANEOUS
  Administered 2024-11-18 – 2024-11-19 (×4): 2 [IU] via SUBCUTANEOUS
  Administered 2024-11-19: 1 [IU] via SUBCUTANEOUS
  Administered 2024-11-19 – 2024-11-21 (×11): 2 [IU] via SUBCUTANEOUS
  Administered 2024-11-21 – 2024-11-22 (×4): 1 [IU] via SUBCUTANEOUS
  Administered 2024-11-22: 2 [IU] via SUBCUTANEOUS
  Administered 2024-11-22 (×2): 1 [IU] via SUBCUTANEOUS
  Administered 2024-11-22 – 2024-11-23 (×3): 2 [IU] via SUBCUTANEOUS
  Administered 2024-11-23 (×2): 1 [IU] via SUBCUTANEOUS
  Administered 2024-11-23: 2 [IU] via SUBCUTANEOUS
  Administered 2024-11-23: 1 [IU] via SUBCUTANEOUS
  Administered 2024-11-23: 2 [IU] via SUBCUTANEOUS
  Administered 2024-11-23: 3 [IU] via SUBCUTANEOUS
  Administered 2024-11-24: 2 [IU] via SUBCUTANEOUS
  Administered 2024-11-24 (×2): 1 [IU] via SUBCUTANEOUS
  Administered 2024-11-24 (×2): 2 [IU] via SUBCUTANEOUS
  Administered 2024-11-25 (×4): 1 [IU] via SUBCUTANEOUS
  Administered 2024-11-26: 2 [IU] via SUBCUTANEOUS
  Administered 2024-11-26 – 2024-11-27 (×3): 1 [IU] via SUBCUTANEOUS
  Administered 2024-11-27: 17:00:00 2 [IU] via SUBCUTANEOUS
  Administered 2024-11-27: 1 [IU] via SUBCUTANEOUS
  Administered 2024-11-27: 12:00:00 2 [IU] via SUBCUTANEOUS
  Administered 2024-11-27 – 2024-11-28 (×3): 1 [IU] via SUBCUTANEOUS
  Administered 2024-11-28: 05:00:00 2 [IU] via SUBCUTANEOUS
  Administered 2024-11-29 (×3): 1 [IU] via SUBCUTANEOUS
  Administered 2024-11-29 (×2): 2 [IU] via SUBCUTANEOUS
  Administered 2024-11-30 (×2): 1 [IU] via SUBCUTANEOUS
  Administered 2024-11-30 – 2024-12-01 (×3): 2 [IU] via SUBCUTANEOUS
  Administered 2024-12-01 – 2024-12-02 (×2): 1 [IU] via SUBCUTANEOUS
  Administered 2024-12-02: 3 [IU] via SUBCUTANEOUS
  Administered 2024-12-02: 1 [IU] via SUBCUTANEOUS
  Administered 2024-12-02: 2 [IU] via SUBCUTANEOUS
  Administered 2024-12-02: 1 [IU] via SUBCUTANEOUS
  Administered 2024-12-02 – 2024-12-03 (×2): 2 [IU] via SUBCUTANEOUS
  Administered 2024-12-03: 1 [IU] via SUBCUTANEOUS
  Administered 2024-12-03: 3 [IU] via SUBCUTANEOUS
  Administered 2024-12-03: 1 [IU] via SUBCUTANEOUS
  Administered 2024-12-03 – 2024-12-04 (×4): 2 [IU] via SUBCUTANEOUS
  Administered 2024-12-04 (×4): 1 [IU] via SUBCUTANEOUS
  Administered 2024-12-05: 2 [IU] via SUBCUTANEOUS
  Administered 2024-12-05 (×3): 1 [IU] via SUBCUTANEOUS
  Filled 2024-11-15 (×2): qty 1
  Filled 2024-11-15 (×6): qty 2
  Filled 2024-11-15: qty 4
  Filled 2024-11-15: qty 1
  Filled 2024-11-15 (×2): qty 2
  Filled 2024-11-15 (×2): qty 1
  Filled 2024-11-15: qty 2
  Filled 2024-11-15: qty 1
  Filled 2024-11-15 (×4): qty 2
  Filled 2024-11-15 (×2): qty 1
  Filled 2024-11-15: qty 2
  Filled 2024-11-15 (×2): qty 3
  Filled 2024-11-15 (×2): qty 2
  Filled 2024-11-15 (×3): qty 1
  Filled 2024-11-15: qty 2
  Filled 2024-11-15 (×3): qty 1
  Filled 2024-11-15: qty 2
  Filled 2024-11-15: qty 1
  Filled 2024-11-15: qty 2
  Filled 2024-11-15: qty 3
  Filled 2024-11-15: qty 2
  Filled 2024-11-15: qty 1
  Filled 2024-11-15: qty 3
  Filled 2024-11-15: qty 1
  Filled 2024-11-15: qty 3
  Filled 2024-11-15: qty 2
  Filled 2024-11-15: qty 3
  Filled 2024-11-15 (×2): qty 1
  Filled 2024-11-15 (×4): qty 2
  Filled 2024-11-15 (×2): qty 1
  Filled 2024-11-15 (×2): qty 2
  Filled 2024-11-15: qty 1
  Filled 2024-11-15: qty 2
  Filled 2024-11-15: qty 1
  Filled 2024-11-15 (×3): qty 3
  Filled 2024-11-15 (×4): qty 2
  Filled 2024-11-15 (×2): qty 1
  Filled 2024-11-15: qty 9
  Filled 2024-11-15: qty 2
  Filled 2024-11-15: qty 3
  Filled 2024-11-15 (×2): qty 1
  Filled 2024-11-15: qty 2
  Filled 2024-11-15: qty 1
  Filled 2024-11-15: qty 2
  Filled 2024-11-15: qty 1
  Filled 2024-11-15: qty 2
  Filled 2024-11-15: qty 5
  Filled 2024-11-15: qty 3
  Filled 2024-11-15: qty 1
  Filled 2024-11-15: qty 2
  Filled 2024-11-15: qty 1
  Filled 2024-11-15: qty 2
  Filled 2024-11-15: qty 1
  Filled 2024-11-15: qty 2
  Filled 2024-11-15: qty 5
  Filled 2024-11-15: qty 1
  Filled 2024-11-15 (×2): qty 2
  Filled 2024-11-15: qty 1
  Filled 2024-11-15 (×4): qty 2
  Filled 2024-11-15: qty 1
  Filled 2024-11-15: qty 2
  Filled 2024-11-15: qty 1

## 2024-11-15 MED ORDER — SODIUM CHLORIDE 0.9 % IV SOLN
2.0000 g | INTRAVENOUS | Status: DC
  Administered 2024-11-15 – 2024-11-16 (×2): 2 g via INTRAVENOUS
  Filled 2024-11-15 (×4): qty 20

## 2024-11-15 MED ORDER — MIDAZOLAM HCL (PF) 5 MG/ML IJ SOLN: 2.0000 mg | Freq: Once | INTRAMUSCULAR | Status: DC

## 2024-11-15 MED ORDER — REVEFENACIN 175 MCG/3ML IN SOLN
175.0000 ug | Freq: Every day | RESPIRATORY_TRACT | Status: DC
  Administered 2024-11-15 – 2024-12-05 (×21): 175 ug via RESPIRATORY_TRACT
  Filled 2024-11-15 (×21): qty 3

## 2024-11-15 MED ORDER — MIDAZOLAM HCL (PF) 2 MG/2ML IJ SOLN
2.0000 mg | Freq: Once | INTRAMUSCULAR | Status: AC
  Administered 2024-11-15: 2 mg via INTRAVENOUS
  Filled 2024-11-15: qty 2

## 2024-11-15 MED ORDER — PHENOBARBITAL SODIUM 65 MG/ML IJ SOLN: 65.0000 mg | Freq: Three times a day (TID) | INTRAMUSCULAR | Status: DC

## 2024-11-15 MED ORDER — IPRATROPIUM-ALBUTEROL 0.5-2.5 (3) MG/3ML IN SOLN: 3.0000 mL | Freq: Four times a day (QID) | RESPIRATORY_TRACT | Status: DC | PRN

## 2024-11-15 MED ORDER — ETOMIDATE 2 MG/ML IV SOLN: 20.0000 mg | Freq: Once | INTRAVENOUS | Status: AC

## 2024-11-15 MED ORDER — FENTANYL CITRATE (PF) 50 MCG/ML IJ SOSY: 50.0000 ug | PREFILLED_SYRINGE | INTRAMUSCULAR | Status: DC | PRN

## 2024-11-15 MED ORDER — MAGNESIUM SULFATE 4 GM/100ML IV SOLN
4.0000 g | Freq: Once | INTRAVENOUS | Status: AC
  Administered 2024-11-15: 4 g via INTRAVENOUS
  Filled 2024-11-15: qty 100

## 2024-11-15 MED ORDER — IPRATROPIUM-ALBUTEROL 0.5-2.5 (3) MG/3ML IN SOLN
3.0000 mL | Freq: Four times a day (QID) | RESPIRATORY_TRACT | Status: DC
  Administered 2024-11-15: 3 mL via RESPIRATORY_TRACT
  Filled 2024-11-15: qty 3

## 2024-11-15 MED ORDER — MIDAZOLAM HCL 2 MG/2ML IJ SOLN
INTRAMUSCULAR | Status: AC
  Filled 2024-11-15: qty 2

## 2024-11-15 MED ORDER — CHLORHEXIDINE GLUCONATE CLOTH 2 % EX PADS
6.0000 | MEDICATED_PAD | Freq: Every day | CUTANEOUS | Status: DC
  Administered 2024-11-15 – 2024-11-25 (×13): 6 via TOPICAL

## 2024-11-15 MED ORDER — SODIUM CHLORIDE 0.9 % IV SOLN
260.0000 mg | Freq: Once | INTRAVENOUS | Status: AC
  Administered 2024-11-15: 260 mg via INTRAVENOUS
  Filled 2024-11-15: qty 2

## 2024-11-15 MED ORDER — ETOMIDATE 2 MG/ML IV SOLN
INTRAVENOUS | Status: AC
  Administered 2024-11-15: 20 mg via INTRAVENOUS
  Filled 2024-11-15: qty 20

## 2024-11-15 MED ORDER — FUROSEMIDE 10 MG/ML IJ SOLN
60.0000 mg | Freq: Once | INTRAMUSCULAR | Status: AC
  Administered 2024-11-15: 60 mg via INTRAVENOUS
  Filled 2024-11-15: qty 6

## 2024-11-15 MED ORDER — FENTANYL CITRATE (PF) 50 MCG/ML IJ SOSY
PREFILLED_SYRINGE | INTRAMUSCULAR | Status: DC
  Filled 2024-11-15: qty 2

## 2024-11-15 MED ORDER — BUDESONIDE 0.25 MG/2ML IN SUSP
0.2500 mg | Freq: Two times a day (BID) | RESPIRATORY_TRACT | Status: DC
  Administered 2024-11-15 – 2024-12-05 (×41): 0.25 mg via RESPIRATORY_TRACT
  Filled 2024-11-15 (×41): qty 2

## 2024-11-15 MED ORDER — ENOXAPARIN SODIUM 40 MG/0.4ML IJ SOSY
40.0000 mg | PREFILLED_SYRINGE | Freq: Every day | INTRAMUSCULAR | Status: DC
  Administered 2024-11-15 – 2024-11-18 (×4): 40 mg via SUBCUTANEOUS
  Filled 2024-11-15 (×5): qty 0.4

## 2024-11-15 MED ORDER — POLYETHYLENE GLYCOL 3350 17 G PO PACK: 17.0000 g | PACK | Freq: Every day | ORAL | Status: DC | PRN

## 2024-11-15 MED ORDER — LORAZEPAM 2 MG/ML IJ SOLN
2.0000 mg | Freq: Once | INTRAMUSCULAR | Status: AC
  Administered 2024-11-15: 2 mg via INTRAVENOUS
  Filled 2024-11-15: qty 1

## 2024-11-15 MED ORDER — POLYETHYLENE GLYCOL 3350 17 G PO PACK: 17.0000 g | PACK | Freq: Every day | ORAL | Status: DC

## 2024-11-15 MED ORDER — SERTRALINE HCL 50 MG PO TABS
25.0000 mg | ORAL_TABLET | Freq: Every day | ORAL | Status: DC
  Administered 2024-11-15 – 2024-12-05 (×21): 25 mg
  Filled 2024-11-15 (×21): qty 1

## 2024-11-15 MED ORDER — FENTANYL BOLUS VIA INFUSION
25.0000 ug | INTRAVENOUS | Status: DC | PRN
  Administered 2024-11-16 (×4): 50 ug via INTRAVENOUS
  Administered 2024-11-16 – 2024-11-17 (×3): 100 ug via INTRAVENOUS
  Administered 2024-11-17: 50 ug via INTRAVENOUS
  Administered 2024-11-17: 100 ug via INTRAVENOUS

## 2024-11-15 MED ORDER — ADULT MULTIVITAMIN W/MINERALS CH
1.0000 | ORAL_TABLET | Freq: Every day | ORAL | Status: DC
  Administered 2024-11-15: 1 via ORAL
  Filled 2024-11-15: qty 1

## 2024-11-15 MED ORDER — ORAL CARE MOUTH RINSE
15.0000 mL | OROMUCOSAL | Status: DC
  Administered 2024-11-15 – 2024-12-05 (×242): 15 mL via OROMUCOSAL

## 2024-11-15 MED ORDER — PANTOPRAZOLE SODIUM 40 MG IV SOLR
40.0000 mg | Freq: Every day | INTRAVENOUS | Status: DC
  Administered 2024-11-15 – 2024-12-05 (×21): 40 mg via INTRAVENOUS
  Filled 2024-11-15 (×21): qty 10

## 2024-11-15 MED ORDER — VANCOMYCIN HCL 1750 MG/350ML IV SOLN
1750.0000 mg | Freq: Once | INTRAVENOUS | Status: AC
  Administered 2024-11-15: 1750 mg via INTRAVENOUS
  Filled 2024-11-15: qty 350

## 2024-11-15 MED ORDER — PHENOBARBITAL SODIUM 65 MG/ML IJ SOLN: 32.5000 mg | Freq: Three times a day (TID) | INTRAMUSCULAR | Status: DC

## 2024-11-15 MED ORDER — NOREPINEPHRINE 4 MG/250ML-% IV SOLN: 0.0000 ug/min | INTRAVENOUS | Status: DC

## 2024-11-15 MED ORDER — PHENYLEPHRINE 80 MCG/ML (10ML) SYRINGE FOR IV PUSH (FOR BLOOD PRESSURE SUPPORT): 80.0000 ug | PREFILLED_SYRINGE | Freq: Once | INTRAVENOUS | Status: DC | PRN

## 2024-11-15 MED ORDER — FENTANYL CITRATE (PF) 50 MCG/ML IJ SOSY: 25.0000 ug | PREFILLED_SYRINGE | Freq: Once | INTRAMUSCULAR | Status: DC

## 2024-11-15 MED ORDER — PHENOBARBITAL SODIUM 130 MG/ML IJ SOLN
97.5000 mg | Freq: Three times a day (TID) | INTRAMUSCULAR | Status: DC
  Administered 2024-11-15 (×2): 97.5 mg via INTRAVENOUS
  Filled 2024-11-15 (×2): qty 1

## 2024-11-15 MED ORDER — FAMOTIDINE 20 MG PO TABS: 20.0000 mg | ORAL_TABLET | Freq: Two times a day (BID) | ORAL | Status: DC

## 2024-11-15 MED ORDER — PHENOBARBITAL SODIUM 130 MG/ML IJ SOLN
130.0000 mg | Freq: Once | INTRAMUSCULAR | Status: AC
  Administered 2024-11-15: 130 mg via INTRAVENOUS
  Filled 2024-11-15: qty 1

## 2024-11-15 MED ORDER — ORAL CARE MOUTH RINSE: 15.0000 mL | OROMUCOSAL | Status: DC | PRN

## 2024-11-15 MED ORDER — METHYLPREDNISOLONE SODIUM SUCC 125 MG IJ SOLR
60.0000 mg | INTRAMUSCULAR | Status: DC
  Administered 2024-11-15 – 2024-11-17 (×3): 60 mg via INTRAVENOUS
  Filled 2024-11-15 (×2): qty 2

## 2024-11-15 MED ORDER — VANCOMYCIN HCL IN DEXTROSE 1-5 GM/200ML-% IV SOLN
1000.0000 mg | Freq: Two times a day (BID) | INTRAVENOUS | Status: DC
  Administered 2024-11-16 – 2024-11-18 (×5): 1000 mg via INTRAVENOUS
  Filled 2024-11-15 (×6): qty 200

## 2024-11-15 MED ORDER — PHENYLEPHRINE 80 MCG/ML (10ML) SYRINGE FOR IV PUSH (FOR BLOOD PRESSURE SUPPORT)
PREFILLED_SYRINGE | INTRAVENOUS | Status: DC
  Filled 2024-11-15: qty 10

## 2024-11-15 MED ORDER — FENTANYL 2500MCG IN NS 250ML (10MCG/ML) PREMIX INFUSION
0.0000 ug/h | INTRAVENOUS | Status: DC
  Administered 2024-11-15 – 2024-11-16 (×2): 50 ug/h via INTRAVENOUS
  Filled 2024-11-15 (×2): qty 250

## 2024-11-15 NOTE — Progress Notes (Signed)
 Pharmacy Antibiotic Note  Richard [name] is a 53 y.o. male admitted on 11/14/2024 with pneumonia.  Pharmacy has been consulted for vancomycin , ceftriaxone  dosing.  MD aware of MRSA neg this AM - requested MRSA coverage in light of GPC in BAL  Plan: Vancomycin  1750mg  IV x 1 then vancomycin  1000mg  IV q 12h (eAUC 427, Scr 1.04) Goal trough 15-21mcg/mL, Goal AUC 400-600 -F/u renal function, LOT, and culture data -F/u vancomycin  levels PRN per protocol Ceftriaxone  2g IV q24 F/u renal func, LOT, and culture data  Height: 5' 9 (175.3 cm) Weight: 90.5 kg (199 lb 8.3 oz) IBW/kg (Calculated) : 70.7  Temp (24hrs), Avg:98.4 F (36.9 C), Min:97 F (36.1 C), Max:101.2 F (38.4 C)  Recent Labs  Lab 11/14/24 2218 11/14/24 2319 11/15/24 0718  WBC 8.4  --  8.2  CREATININE  --  0.99 1.04    Estimated Creatinine Clearance: 90.3 mL/min (by C-G formula based on SCr of 1.04 mg/dL).    Allergies  Allergen Reactions   Dilaudid  [Hydromorphone  Hcl] Nausea Only and Other (See Comments)    Bradycardia  Hyperthermia    Tape Itching and Dermatitis    Antimicrobials this admission: Unasyn  12/3 x2 Vanc 12/3> Ceftriaxone  12/3>  Dose adjustments this admission:  Microbiology results: 12/2 RVP neg 12/3 MRSA  12/3 TA: GPR, GPC in cluster 12/3: BAL: GPR, GPC in cluster  Thank you for allowing pharmacy to be a part of this patient's care.  Sharyne Glatter, PharmD, BCCCP Critical Care Clinical Pharmacist 11/15/2024 3:05 PM
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
## 2024-11-15 NOTE — ED Provider Notes (Signed)
 Care of patient received from prior provider at 4:42 AM, please see their note for complete H/P and care plan.  Reassessment: Substantial intervention in the emergency room.  Frequently evaluated, attempting to keep patient off of vent but his delirium and agitation is very difficult to control. Differential is broad including alcohol withdrawal delirium, encephalopathy, drug-induced psychosis or similar. Aggressively diuresing as the underlying etiology appears to be heart failure exacerbation with pleural effusions. Took substantially escalating sedation and ultimately Precedex  led to control of patient's agitation.  CRITICAL CARE Performed by: Cyndee Dada   Total critical care time: 90 minutes  Critical care time was exclusive of separately billable procedures and treating other patients.  Critical care was necessary to treat or prevent imminent or life-threatening deterioration.  Critical care was time spent personally by me on the following activities: development of treatment plan with patient and/or surrogate as well as nursing, discussions with consultants, evaluation of patient's response to treatment, examination of patient, obtaining history from patient or surrogate, ordering and performing treatments and interventions, ordering and review of laboratory studies, ordering and review of radiographic studies, pulse oximetry and re-evaluation of patient's condition.     Dada Cyndee, MD 11/15/24 [phone]

## 2024-11-15 NOTE — Plan of Care (Signed)
     Referral previously received for Richard [name] for goals of care discussion. Reviewed the chart including admission H&P as well as PCCM procedural notes.  At this time the patient is intubated and medical staff has been unable to contact any family.  I went to the bedside and spoke with the PCCM attending Dr.; Harold. Palliative medicine is unable to have a GOC conversation unless/until family is identified and willing to participate or patient is extubated and able to participate.  Suggestions on tracking down family including involvement with social work and possible police checks to notify family of admission.  I shared that palliative medicine is more than willing to participate in this patient's care once family or other decision-maker identified or patient able to participate.  At this time palliative medicine will cancel the consult. Please reconsult and contact the palliative medicine provider on service for any new/urgent needs that require our assistance with this patient.  Thank you for your referral and allowing PMT to assist in Richard [name]'s care.   Camellia Kays, NP Palliative Medicine Team Phone: [phone]  NO CHARGE

## 2024-11-15 NOTE — Procedures (Signed)
 Intubation Procedure Note  Richard [name]  [id number]  [birth year]  Date:11/15/24  Time:8:04 AM   Provider Performing:Richard [name]    Procedure: Intubation (31500)  Indication(s) Respiratory Failure  Consent Unable to obtain consent due to emergent nature of procedure.   Anesthesia Etomidate and Rocuronium    Time Out Verified patient identification, verified procedure, site/side was marked, verified correct patient position, special equipment/implants available, medications/allergies/relevant history reviewed, required imaging and test results available.   Sterile Technique Usual hand hygeine, masks, and gloves were used   Procedure Description Patient positioned in bed supine.  Sedation given as noted above.  Patient was intubated with endotracheal tube using Glidescope.  View was Grade 1 full glottis .  Number of attempts was 1.  Colorimetric CO2 detector was consistent with tracheal placement.   Complications/Tolerance None; patient tolerated the procedure well. Chest X-ray is ordered to verify placement.   EBL Minimal   Specimen(s) None   Richard [name], [name] [facility] [facility] 11/15/24 8:05 AM  Please see [email or web address] for pager details.  From 7A-7P if no response, please call [phone] After hours, please call ELink [phone]

## 2024-11-15 NOTE — Procedures (Signed)
 Bedside Bronchoscopy Procedure Note Shahin Knierim [id number] Apr 09, 1971  Procedure: Bronchoscopy Indications: Diagnostic evaluation of the airways, Obtain specimens for culture and/or other diagnostic studies, and Remove secretions  Procedure Details: ET Tube Size: ET Tube secured at lip (cm): Bite block in place: Yes In preparation for procedure, Patient hyper-oxygenated with 100 % FiO2 Airway entered and the following bronchi were examined: RUL, RML, RLL, LUL, LLL, and Bronchi.   Bronchoscope removed.    Evaluation BP (!) 75/64   Pulse 96   Temp 98.4 F (36.9 C) (Axillary)   Resp 15   Ht 5' 9 (1.753 m)   Wt 90.5 kg   SpO2 90%   BMI 29.46 kg/m  Breath Sounds:Clear O2 sats: stable throughout Patient's Current Condition: stable Specimens:  Sent thick tan secretions Complications: No apparent complications Patient did tolerate procedure well.   Chamara Dyck L 11/15/2024, 10:07 AM

## 2024-11-15 NOTE — Procedures (Signed)
 Thoracentesis  Procedure Note  Richard [name]  [id number]  11-04-1971  Date:11/15/24  Time:5:51 PM   Provider Performing:Elania Crowl [name]   Procedure: Thoracentesis with imaging guidance (67444)  Indication(s) Pleural Effusion  Consent Unable to obtain consent due to inability to find a medical decision maker for patient.  All reasonable efforts were made.  Another independent medical provider, Dr. Harold , confirmed the benefits of this procedure outweigh the risks.  Anesthesia Topical with 1% lidocaine , sedated on propofol  and fentanyl    Time Out Verified patient identification, verified procedure, site/side was marked, verified correct patient position, special equipment/implants available, medications/allergies/relevant history reviewed, required imaging and test results available.   Sterile Technique Maximal sterile technique including full sterile barrier drape, hand hygiene, sterile gown, sterile gloves, mask, hair covering, sterile ultrasound probe cover (if used).  Procedure Description Ultrasound was used to identify appropriate pleural anatomy for placement and overlying skin marked.  Area of drainage cleaned and draped in sterile fashion. Lidocaine  was used to anesthetize the skin and subcutaneous tissue.  1000 cc's of straw colored fluid was drained from the right pleural space. Catheter then removed and bandaid applied to site.   Complications/Tolerance None; patient tolerated the procedure well. Chest X-ray is ordered to confirm no post-procedural complication.   EBL Minimal   Specimen(s) Pleural fluid   [name] [name], PA-C [facility] 11/15/24 5:52 PM  Please see [email or web address] for pager details.  From 7A-7P if no response, please call [phone] After hours, please call ELink [phone]

## 2024-11-15 NOTE — Progress Notes (Signed)
 PCCM Interval Progress Note  Mr. Angelos is a 53 year old male with PMH of HTN, COPD, CHF, chronic respiratory failure (on 4-5L [place]), NSCLC stage IIIb s/p palliative radiation, chemotherapy and immunotherapy stopped due to noncompliance, TBI, ICH, aortic aneurysm, history of DVT, tobacco abuse, and history of substance abuse who was admitted for encephalopathy and acute on chronic hypoxic/hypercarbic respiratory in the setting of alcohol intoxication.   Shortly after arrival to ICU patient with increasing oxygen  requirements to [facility] 100% with NRB with SpO2 in the high 80's. Patient minimally responsive. Decision was made to intubate.  Physical exam:  General: acute on chronically ill appearing male, minimally responsive HEENT: [place]/AT, sclera anicteric Neuro: GCS8 ([id number]), moves all extremities with good strength but does not follow commands, make incomprehensible groans only Chest: increased work of breathing of HFNC and NRB, coarse throughout, diminished right base  Heart: tachycardic, normal S1 and S2 Abdomen: soft, non-distended, +BS  Acute on chronic hypoxic/hypercarbic respiratory failure in the setting of COPD and stage 3b NSCLC, right pleural effusion Acute encephalopathy, unclear etiology - likely hypoxia/hypercarbia and Etoh intoxication contributing, also concern for withdrawal/other substance ingestion - Intubated, continue LTVV with goal DP<15 and Pplat<30 - VAP protocol/PPI - Follow-up BAL, start Unasyn  with concern for aspiration - Start steroids for possible COPD exacerbation and triple therapy nebs, PRN BD - Continue PAD protocol with fentanyl  and propofol  for RASS goal 0 to -1 - Hold CIWA protocol and phenobarb while on propofol , will need to resume when off propofol   - Obtain CT PE - Continue levophed  as needed to maintain MAP>88% - Will send urine 9 drug screen  - Start high dose thiamine  - Will send UA and pro-calcitonin    Addendum Reviewed CT PE which was negative  for PE, shows worsening 5.3 cm mass-like consolidation in the medial left lower lobe, small to moderate right pleural effusion. - Consider oncology consult for prognostication - Performed right thoracentesis with 1L straw colored output, fluid sent for culture and cytology- follow-up  - BAL with moderate WBC, few GPC and few GNR, switch from Unasyn  to vanc and ceftriaxone . Narrow as able pending speciation   Attempted multiple times to call all three emergency contacts with no answer or disconnected lines.   The patient is critically ill with multiple organ system failure and requires high complexity decision making for assessment and support, frequent evaluation and titration of therapies, advanced monitoring, review of radiographic studies and interpretation of complex data.   Critical Care Time devoted to patient care services, exclusive of separately billable procedures, described in this note is 60 minutes.  [name] [name], PA-C [facility] 11/15/24 6:33 PM  Please see [email or web address] for pager details.  From 7A-7P if no response, please call [phone] After hours, please call ELink [phone]

## 2024-11-15 NOTE — Progress Notes (Signed)
 eLink Physician-Brief Progress Note Patient Name: Richard [name] DOB: January 17, 1971 MRN: [id number]   Date of Service  11/15/2024  HPI/Events of Note  53 year old male that presents with agitated delirium in the setting of alcohol intoxication, oxygen  dependent COPD, and polypharmacy.  He is tachypneic, tachycardic, and hypoxic on heated high flow nasal cannula.  Currently on Precedex  infusion and status post phenobarbital load.  Results consistent with primarily metabolic acidosis and acute alcohol intoxication.  eICU Interventions  Consider right-sided thoracentesis, status post Lasix  Given his profuse hypoxemia and increased work of breathing, may benefit from invasive mechanical ventilation Continue phenobarbital taper, restraints as needed, Precedex  DVT prophylaxis with enoxaparin  GI prophylaxis not indicated     Intervention Category Evaluation Type: New Patient Evaluation  Richard [name] 11/15/2024, 6:29 AM

## 2024-11-15 NOTE — Procedures (Signed)
 Bronchoscopy Procedure Note  Richard [name]  [id number]  1971-10-16  Date:11/15/24  Time:10:04 AM   Provider Performing:Shonette Rhames [name]   Procedure(s):  Flexible Bronchoscopy [phone])  Indication(s) Acute on chronic hypoxic and hypercarbic respiratory failure  Consent Unable to obtain consent due to emergent nature of procedure.  Anesthesia 2 mg Versed , on propofol  infusion   Time Out Verified patient identification, verified procedure, site/side was marked, verified correct patient position, special equipment/implants available, medications/allergies/relevant history reviewed, required imaging and test results available.   Sterile Technique Usual hand hygiene, masks, gowns, and gloves were used   Procedure Description Bronchoscope advanced through endotracheal tube and into airway.  Airways were examined down to subsegmental level with findings noted below.   Following diagnostic evaluation, BAL(s) performed in right bronchus with normal saline and return of thick, yellow fluid  Findings: Purulent secretions throughout right side, narrowed airways, prominent bright red capillaries throughout lung tissue   Complications/Tolerance None; patient tolerated the procedure well. Chest X-ray is not needed post procedure.   EBL Minimal   Specimen(s) BAL of right    [name] [name], [name] [facility] 11/15/24 10:07 AM  Please see [email or web address] for pager details.  From 7A-7P if no response, please call [phone] After hours, please call ELink [phone]

## 2024-11-15 NOTE — Plan of Care (Signed)
  Problem: Safety: Goal: Violent Restraint(s) Outcome: Progressing   Problem: Clinical Measurements: Goal: Ability to maintain clinical measurements within normal limits will improve Outcome: Progressing Goal: Will remain free from infection Outcome: Progressing Goal: Cardiovascular complication will be avoided Outcome: Progressing   Problem: Coping: Goal: Level of anxiety will decrease Outcome: Progressing   Problem: Elimination: Goal: Will not experience complications related to urinary retention Outcome: Progressing   Problem: Pain Managment: Goal: General experience of comfort will improve and/or be controlled Outcome: Progressing   Problem: Safety: Goal: Ability to remain free from injury will improve Outcome: Progressing   Problem: Education: Goal: Knowledge of General Education information will improve Description: Including pain rating scale, medication(s)/side effects and non-pharmacologic comfort measures Outcome: Not Progressing   Problem: Nutrition: Goal: Adequate nutrition will be maintained Outcome: Not Progressing

## 2024-11-15 NOTE — Progress Notes (Signed)
 Patient was transported from [facility] room in the ED to 4N-26. Patient was transported on NRB and Medium Flow [place]. Patient remained stable throughout trip with no noted complications. Placed patient back on HHFNC once to the new room, settings are still 60L/80%. Reported was given to receiving RT.

## 2024-11-15 NOTE — Progress Notes (Signed)
Patient was transported to CT & back to 4N26 without any complications.

## 2024-11-16 ENCOUNTER — Inpatient Hospital Stay (HOSPITAL_COMMUNITY)

## 2024-11-16 DIAGNOSIS — F1010 Alcohol abuse, uncomplicated: Secondary | ICD-10-CM

## 2024-11-16 DIAGNOSIS — E440 Moderate protein-calorie malnutrition: Secondary | ICD-10-CM | POA: Insufficient documentation

## 2024-11-16 LAB — AMMONIA: Ammonia: 34 umol/L (ref 9–35)

## 2024-11-16 LAB — CBC
HCT: 47.3 % (ref 39.0–52.0)
Hemoglobin: 15.5 g/dL (ref 13.0–17.0)
MCH: 31.8 pg (ref 26.0–34.0)
MCHC: 32.8 g/dL (ref 30.0–36.0)
MCV: 97.1 fL (ref 80.0–100.0)
Platelets: 205 K/uL (ref 150–400)
RBC: 4.87 MIL/uL (ref 4.22–5.81)
RDW: 16.5 % — ABNORMAL HIGH (ref 11.5–15.5)
WBC: 6 K/uL (ref 4.0–10.5)
nRBC: 0 % (ref 0.0–0.2)

## 2024-11-16 LAB — GLUCOSE, CAPILLARY
Glucose-Capillary: 134 mg/dL — ABNORMAL HIGH (ref 70–99)
Glucose-Capillary: 137 mg/dL — ABNORMAL HIGH (ref 70–99)
Glucose-Capillary: 139 mg/dL — ABNORMAL HIGH (ref 70–99)
Glucose-Capillary: 148 mg/dL — ABNORMAL HIGH (ref 70–99)
Glucose-Capillary: 177 mg/dL — ABNORMAL HIGH (ref 70–99)
Glucose-Capillary: 189 mg/dL — ABNORMAL HIGH (ref 70–99)

## 2024-11-16 LAB — BASIC METABOLIC PANEL WITH GFR
Anion gap: 9 (ref 5–15)
BUN: 29 mg/dL — ABNORMAL HIGH (ref 6–20)
CO2: 24 mmol/L (ref 22–32)
Calcium: 8.2 mg/dL — ABNORMAL LOW (ref 8.9–10.3)
Chloride: 104 mmol/L (ref 98–111)
Creatinine, Ser: 0.95 mg/dL (ref 0.61–1.24)
GFR, Estimated: >60 mL/min (ref >60)
Glucose, Bld: 125 mg/dL — ABNORMAL HIGH (ref 70–99)
Potassium: 4.5 mmol/L (ref 3.5–5.1)
Sodium: 137 mmol/L (ref 135–145)

## 2024-11-16 LAB — PHOSPHORUS: Phosphorus: 4.1 mg/dL (ref 2.5–4.6)

## 2024-11-16 LAB — TRIGLYCERIDES: Triglycerides: 91 mg/dL (ref <150)

## 2024-11-16 LAB — MAGNESIUM: Magnesium: 2.6 mg/dL — ABNORMAL HIGH (ref 1.7–2.4)

## 2024-11-16 MED ORDER — PHENOBARBITAL 32.4 MG PO TABS
97.2000 mg | ORAL_TABLET | Freq: Three times a day (TID) | ORAL | Status: AC
  Administered 2024-11-16 – 2024-11-17 (×4): 97.2 mg
  Filled 2024-11-16 (×4): qty 3

## 2024-11-16 MED ORDER — FUROSEMIDE 10 MG/ML IJ SOLN
40.0000 mg | Freq: Once | INTRAMUSCULAR | Status: AC
  Administered 2024-11-16: 40 mg via INTRAVENOUS
  Filled 2024-11-16: qty 4

## 2024-11-16 MED ORDER — PHENOBARBITAL 32.4 MG PO TABS: 32.4000 mg | ORAL_TABLET | Freq: Three times a day (TID) | ORAL | Status: DC

## 2024-11-16 MED ORDER — PHENOBARBITAL 32.4 MG PO TABS
64.8000 mg | ORAL_TABLET | Freq: Three times a day (TID) | ORAL | Status: AC
  Administered 2024-11-17 – 2024-11-19 (×6): 64.8 mg
  Filled 2024-11-16 (×6): qty 2

## 2024-11-16 MED ORDER — DEXMEDETOMIDINE HCL IN NACL 400 MCG/100ML IV SOLN
0.0000 ug/kg/h | INTRAVENOUS | Status: DC
  Administered 2024-11-16: 0.4 ug/kg/h via INTRAVENOUS
  Administered 2024-11-16: 0.7 ug/kg/h via INTRAVENOUS
  Administered 2024-11-17: 1.2 ug/kg/h via INTRAVENOUS
  Administered 2024-11-17: 1.1 ug/kg/h via INTRAVENOUS
  Administered 2024-11-17: 1 ug/kg/h via INTRAVENOUS
  Administered 2024-11-17: 0.5 ug/kg/h via INTRAVENOUS
  Administered 2024-11-17: 0.8 ug/kg/h via INTRAVENOUS
  Administered 2024-11-18 (×5): 1.2 ug/kg/h via INTRAVENOUS
  Administered 2024-11-19: 1.1 ug/kg/h via INTRAVENOUS
  Administered 2024-11-19: 0.8 ug/kg/h via INTRAVENOUS
  Administered 2024-11-19 (×2): 1.2 ug/kg/h via INTRAVENOUS
  Administered 2024-11-19: 1 ug/kg/h via INTRAVENOUS
  Administered 2024-11-19: 1.2 ug/kg/h via INTRAVENOUS
  Administered 2024-11-20: 0.9 ug/kg/h via INTRAVENOUS
  Administered 2024-11-20: 0.7 ug/kg/h via INTRAVENOUS
  Administered 2024-11-20: 0.8 ug/kg/h via INTRAVENOUS
  Administered 2024-11-21: 0.9 ug/kg/h via INTRAVENOUS
  Administered 2024-11-21: 0.3 ug/kg/h via INTRAVENOUS
  Administered 2024-11-22 (×2): 0.4 ug/kg/h via INTRAVENOUS
  Administered 2024-11-23: 0.7 ug/kg/h via INTRAVENOUS
  Administered 2024-11-23: 0.5 ug/kg/h via INTRAVENOUS
  Administered 2024-11-23: 0.7 ug/kg/h via INTRAVENOUS
  Administered 2024-11-24: 1.2 ug/kg/h via INTRAVENOUS
  Administered 2024-11-24: 0.7 ug/kg/h via INTRAVENOUS
  Administered 2024-11-24 – 2024-11-28 (×23): 1.2 ug/kg/h via INTRAVENOUS
  Filled 2024-11-16 (×13): qty 100
  Filled 2024-11-16 (×2): qty 200
  Filled 2024-11-16 (×4): qty 100
  Filled 2024-11-16: qty 200
  Filled 2024-11-16: qty 100
  Filled 2024-11-16: qty 200
  Filled 2024-11-16 (×6): qty 100
  Filled 2024-11-16: qty 200
  Filled 2024-11-16 (×6): qty 100
  Filled 2024-11-16: qty 200
  Filled 2024-11-16 (×11): qty 100

## 2024-11-16 MED ORDER — PROSOURCE TF20 ENFIT COMPATIBL EN LIQD
60.0000 mL | Freq: Two times a day (BID) | ENTERAL | Status: DC
  Administered 2024-11-16 – 2024-12-05 (×38): 60 mL
  Filled 2024-11-16 (×38): qty 60

## 2024-11-16 MED ORDER — OSMOLITE 1.5 CAL PO LIQD
1000.0000 mL | ORAL | Status: DC
  Administered 2024-11-16 – 2024-12-04 (×25): 1000 mL
  Filled 2024-11-16 (×3): qty 1000
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
## 2024-11-16 NOTE — Progress Notes (Addendum)
 Initial Nutrition Assessment  DOCUMENTATION CODES:   Non-severe (moderate) malnutrition in context of chronic illness (cancer)  INTERVENTION:  Initiate tube feeding via OG: Osmolite 1.5 at 65 ml/h (1560 ml per day) Prosource TF20 60 ml BID Provides 2500 kcal, 137 gm protein, 1188 ml free water  daily  Monitor magnesium  and phosphorus daily x 4 occurrences, MD to replete as needed, as pt is at risk for refeeding syndrome given moderate malnutrition. Continue thiamine  regimen 500 mg IVPB  Then transition to 100 mg injection 12/6  NUTRITION DIAGNOSIS:   Moderate Malnutrition related to cancer and cancer related treatments as evidenced by mild fat depletion, moderate fat depletion, moderate muscle depletion.  GOAL:   Patient will meet greater than or equal to 90% of their needs  MONITOR:   Diet advancement, Vent status, TF tolerance, Labs  REASON FOR ASSESSMENT:   Consult Enteral/tube feeding initiation and management  ASSESSMENT:   Pt with hx of CVA, HTN. DVT, CHF, CAD, chronic respiratory failure (on 4-5L [place]), COPD, NSCLC stage 3b (s/p palliative radiation d/t noncompliance w/ chemo and immunotherapy), and polysubstance abuse. Admitted for encephalopathy and acute on chronic respiratory failure in the setting of alcohol intoxication.  12/3 admitted, intubated, s/p thoracentesis (-1L straw colored fluid), s/p bronchoscopy    Patient is currently intubated on ventilator support. CCM working on contacting family and initiating GOC discussions once family can be reached. RN reports no plans for extubation today but may be re-evaluated tomorrow. Pt agitated when not sedated and becomes combative. Possible plan for Cortrak tomorrow depending on GOC. No family at bedside during assessment, RN reports wife has been reached and updated on pt's status. Unable to obtain hx at this time. Pt had been followed by outpatient oncology RD last year and RD noted weight loss and pt missing cancer  treatments. Per chart review, pt appears to have lost significant amount of weight in last 3 months (17%) but unsure of accuracy of weights in chart (multiple appear copy forwarded). Pt's physical exam shows mild to moderate and moderate muscle depletions, indicative of malnutrition. Suspect malnutrition in the setting of worsening cancer and pt unable to meet increased nutrition needs related to cancer. Pt receiving high dose thiamine  and has experienced some electrolyte abnormalities, will titrate tube feeds slowly and continue to monitor.   MV: 11 L/min Temp (24hrs), Avg:98.3 F (36.8 C), Min:97 F (36.1 C), Max:99.7 F (37.6 C) MAP (cuff): 84-89 mmHg  Propofol : 13.58 ml/hr  *provides additional 358 kcal per day at current rate*  Admit weight: 90.5 kg Current weight: 85.2 kg    Intake/Output Summary (Last 24 hours) at 11/16/2024 1154 Last data filed at 11/16/2024 1039 Gross per 24 hour  Intake 2329.9 ml  Output 1125 ml  Net 1204.9 ml   Net IO Since Admission: -2,410.41 mL [11/16/24 1154]  Drains/Lines: OG gastric per xray Urethral Catheter   Nutritionally Relevant Medications: Scheduled Meds:  docusate  100 mg Per Tube BID   feeding supplement (PROSource TF20)  60 mL Per Tube Daily   fentaNYL  (SUBLIMAZE ) injection  25-50 mcg Intravenous Once   folic acid   1 mg Per Tube Daily   insulin aspart  0-9 Units Subcutaneous Q4H   levETIRAcetam   500 mg Per Tube BID   methylPREDNISolone  (SOLU-MEDROL ) injection  60 mg Intravenous Q24H   multivitamin with minerals  1 tablet Per Tube Daily   pantoprazole  (PROTONIX ) IV  40 mg Intravenous Daily   polyethylene glycol  17 g Per Tube Daily  sertraline   25 mg Per Tube Daily   [START ON 11/18/2024] thiamine  (VITAMIN B1) injection  100 mg Intravenous Q24H   Continuous Infusions:  cefTRIAXone  (ROCEPHIN )  IV Stopped (11/15/24 1558)   fentaNYL  infusion INTRAVENOUS 100 mcg/hr (11/16/24 0800)   norepinephrine (LEVOPHED) Adult infusion Stopped  (11/15/24 1601)   propofol  (DIPRIVAN ) infusion 25 mcg/kg/min (11/16/24 0800)   thiamine  (VITAMIN B1) injection Stopped (11/15/24 2139)   vancomycin  Stopped (11/16/24 0456)   PRN Meds:.docusate sodium , fentaNYL   Labs Reviewed: Magnesium  2.6<--1.6 CBG ranges from 119-137 mg/dL over the last 24 hours HgbA1c 6.0 (from 05/2024)   NUTRITION - FOCUSED PHYSICAL EXAM:  Flowsheet Row Most Recent Value  Orbital Region Moderate depletion  Upper Arm Region No depletion  Thoracic and Lumbar Region Mild depletion  Buccal Region Mild depletion  Temple Region Mild depletion  Clavicle Bone Region Moderate depletion  Clavicle and Acromion Bone Region Moderate depletion  Scapular Bone Region Moderate depletion  Dorsal Hand Moderate depletion  Patellar Region Moderate depletion  Anterior Thigh Region Moderate depletion  Posterior Calf Region Unable to assess  [moderate pitting edema]  Edema (RD Assessment) Moderate  [BLE pitting]  Hair Reviewed  Eyes Unable to assess  Mouth Unable to assess  Skin Reviewed  Nails Reviewed    Diet Order:   Diet Order             Diet NPO time specified  Diet effective now                   EDUCATION NEEDS:   Not appropriate for education at this time  Skin:  Skin Assessment: Reviewed RN Assessment  Last BM:  unknown  Height:   Ht Readings from Last 1 Encounters:  11/14/24 5' 9 (1.753 m)    Weight:   Wt Readings from Last 1 Encounters:  11/16/24 85.2 kg    Ideal Body Weight:  72.7 kg  BMI:  Body mass index is 27.74 kg/m.  Estimated Nutritional Needs:   Kcal:  2300-2500  Protein:  120-140  Fluid:  >2L    Josette Glance, MS, RDN, LDN Clinical Dietitian I Please reach out via secure chat

## 2024-11-16 NOTE — Plan of Care (Signed)
  Problem: Safety: Goal: Violent Restraint(s) Outcome: Progressing   Problem: Nutrition: Goal: Adequate nutrition will be maintained Outcome: Progressing   Problem: Coping: Goal: Level of anxiety will decrease Outcome: Progressing   Problem: Elimination: Goal: Will not experience complications related to urinary retention Outcome: Progressing   Problem: Pain Managment: Goal: General experience of comfort will improve and/or be controlled Outcome: Progressing   Problem: Safety: Goal: Ability to remain free from injury will improve Outcome: Progressing   Problem: Education: Goal: Knowledge of General Education information will improve Description: Including pain rating scale, medication(s)/side effects and non-pharmacologic comfort measures Outcome: Not Progressing   Problem: Health Behavior/Discharge Planning: Goal: Ability to manage health-related needs will improve Outcome: Not Progressing   Problem: Clinical Measurements: Goal: Ability to maintain clinical measurements within normal limits will improve Outcome: Not Progressing Goal: Respiratory complications will improve Outcome: Not Progressing Goal: Cardiovascular complication will be avoided Outcome: Not Progressing   Problem: Activity: Goal: Risk for activity intolerance will decrease Outcome: Not Progressing   Problem: Skin Integrity: Goal: Risk for impaired skin integrity will decrease Outcome: Not Progressing   Problem: Safety: Goal: Non-violent Restraint(s) Outcome: Not Progressing   Problem: Activity: Goal: Ability to tolerate increased activity will improve Outcome: Not Progressing   Problem: Respiratory: Goal: Ability to maintain a clear airway and adequate ventilation will improve Outcome: Not Progressing   Problem: Role Relationship: Goal: Method of communication will improve Outcome: Not Progressing   Problem: Fluid Volume: Goal: Ability to maintain a balanced intake and output will  improve Outcome: Not Progressing

## 2024-11-16 NOTE — TOC CM/SW Note (Signed)
 Transition of Care (TOC) CM/SW Note    Obtained IVC Case # from ED Secretary: [id number]

## 2024-11-17 ENCOUNTER — Inpatient Hospital Stay (HOSPITAL_COMMUNITY)

## 2024-11-17 LAB — GLUCOSE, CAPILLARY
Glucose-Capillary: 121 mg/dL — ABNORMAL HIGH (ref 70–99)
Glucose-Capillary: 136 mg/dL — ABNORMAL HIGH (ref 70–99)
Glucose-Capillary: 159 mg/dL — ABNORMAL HIGH (ref 70–99)
Glucose-Capillary: 211 mg/dL — ABNORMAL HIGH (ref 70–99)
Glucose-Capillary: 222 mg/dL — ABNORMAL HIGH (ref 70–99)
Glucose-Capillary: 224 mg/dL — ABNORMAL HIGH (ref 70–99)

## 2024-11-17 LAB — POCT I-STAT 7, (LYTES, BLD GAS, ICA,H+H)
Acid-Base Excess: 1 mmol/L (ref 0.0–2.0)
Acid-Base Excess: 4 mmol/L — ABNORMAL HIGH (ref 0.0–2.0)
Acid-Base Excess: 5 mmol/L — ABNORMAL HIGH (ref 0.0–2.0)
Bicarbonate: 26.2 mmol/L (ref 20.0–28.0)
Bicarbonate: 28.5 mmol/L — ABNORMAL HIGH (ref 20.0–28.0)
Bicarbonate: 30.3 mmol/L — ABNORMAL HIGH (ref 20.0–28.0)
Calcium, Ion: 1.17 mmol/L (ref 1.15–1.40)
Calcium, Ion: 1.15 mmol/L (ref 1.15–1.40)
Calcium, Ion: 1.15 mmol/L (ref 1.15–1.40)
HCT: 50 % (ref 39.0–52.0)
HCT: 52 % (ref 39.0–52.0)
HCT: 53 % — ABNORMAL HIGH (ref 39.0–52.0)
Hemoglobin: 17 g/dL (ref 13.0–17.0)
Hemoglobin: 17.7 g/dL — ABNORMAL HIGH (ref 13.0–17.0)
Hemoglobin: 18 g/dL — ABNORMAL HIGH (ref 13.0–17.0)
O2 Saturation: 92 %
O2 Saturation: 95 %
O2 Saturation: 97 %
Patient temperature: 100.8
Patient temperature: 98.3
Patient temperature: 99.1
Potassium: 4.7 mmol/L (ref 3.5–5.1)
Potassium: 5 mmol/L (ref 3.5–5.1)
Potassium: 4.3 mmol/L (ref 3.5–5.1)
Sodium: 140 mmol/L (ref 135–145)
Sodium: 140 mmol/L (ref 135–145)
Sodium: 140 mmol/L (ref 135–145)
TCO2: 27 mmol/L (ref 22–32)
TCO2: 30 mmol/L (ref 22–32)
TCO2: 32 mmol/L (ref 22–32)
pCO2 arterial: 42.8 mmHg (ref 32–48)
pCO2 arterial: 42.3 mmHg (ref 32–48)
pCO2 arterial: 44.2 mmHg (ref 32–48)
pH, Arterial: 7.399 (ref 7.35–7.45)
pH, Arterial: 7.436 (ref 7.35–7.45)
pH, Arterial: 7.445 (ref 7.35–7.45)
pO2, Arterial: 69 mmHg — ABNORMAL LOW (ref 83–108)
pO2, Arterial: 71 mmHg — ABNORMAL LOW (ref 83–108)
pO2, Arterial: 85 mmHg (ref 83–108)

## 2024-11-17 LAB — PHOSPHORUS: Phosphorus: 2.4 mg/dL — ABNORMAL LOW (ref 2.5–4.6)

## 2024-11-17 LAB — CBC
HCT: 47.2 % (ref 39.0–52.0)
Hemoglobin: 16 g/dL (ref 13.0–17.0)
MCH: 32.7 pg (ref 26.0–34.0)
MCHC: 33.9 g/dL (ref 30.0–36.0)
MCV: 96.5 fL (ref 80.0–100.0)
Platelets: 203 K/uL (ref 150–400)
RBC: 4.89 MIL/uL (ref 4.22–5.81)
RDW: 16.4 % — ABNORMAL HIGH (ref 11.5–15.5)
WBC: 6.8 K/uL (ref 4.0–10.5)
nRBC: 0 % (ref 0.0–0.2)

## 2024-11-17 LAB — CYTOLOGY - NON PAP

## 2024-11-17 LAB — BASIC METABOLIC PANEL WITH GFR
Anion gap: 12 (ref 5–15)
BUN: 30 mg/dL — ABNORMAL HIGH (ref 6–20)
CO2: 26 mmol/L (ref 22–32)
Calcium: 8.6 mg/dL — ABNORMAL LOW (ref 8.9–10.3)
Chloride: 102 mmol/L (ref 98–111)
Creatinine, Ser: 0.92 mg/dL (ref 0.61–1.24)
GFR, Estimated: >60 mL/min (ref >60)
Glucose, Bld: 136 mg/dL — ABNORMAL HIGH (ref 70–99)
Potassium: 4.4 mmol/L (ref 3.5–5.1)
Sodium: 140 mmol/L (ref 135–145)

## 2024-11-17 LAB — MAGNESIUM: Magnesium: 2.5 mg/dL — ABNORMAL HIGH (ref 1.7–2.4)

## 2024-11-17 MED ORDER — FUROSEMIDE 10 MG/ML IJ SOLN
60.0000 mg | Freq: Two times a day (BID) | INTRAMUSCULAR | Status: AC
  Administered 2024-11-17 (×2): 60 mg via INTRAVENOUS
  Filled 2024-11-17 (×2): qty 6

## 2024-11-17 MED ORDER — FUROSEMIDE 10 MG/ML IJ SOLN: 40.0000 mg | Freq: Two times a day (BID) | INTRAMUSCULAR | Status: DC

## 2024-11-17 MED ORDER — CLONIDINE HCL 0.1 MG PO TABS
0.1000 mg | ORAL_TABLET | Freq: Three times a day (TID) | ORAL | Status: DC
  Administered 2024-11-17 – 2024-11-20 (×10): 0.1 mg
  Filled 2024-11-17 (×10): qty 1

## 2024-11-17 MED ORDER — POLYETHYLENE GLYCOL 3350 17 G PO PACK
17.0000 g | PACK | Freq: Two times a day (BID) | ORAL | Status: DC
  Administered 2024-11-17 – 2024-11-18 (×3): 17 g
  Filled 2024-11-17 (×3): qty 1

## 2024-11-17 MED ORDER — FENTANYL CITRATE (PF) 50 MCG/ML IJ SOSY
50.0000 ug | PREFILLED_SYRINGE | INTRAMUSCULAR | Status: DC | PRN
  Administered 2024-11-17 – 2024-11-18 (×5): 200 ug via INTRAVENOUS
  Administered 2024-11-18: 100 ug via INTRAVENOUS
  Administered 2024-11-19: 200 ug via INTRAVENOUS
  Administered 2024-11-19 (×2): 100 ug via INTRAVENOUS
  Administered 2024-11-19: 200 ug via INTRAVENOUS
  Administered 2024-11-20 – 2024-11-21 (×4): 100 ug via INTRAVENOUS
  Administered 2024-11-21: 50 ug via INTRAVENOUS
  Administered 2024-11-22 – 2024-11-23 (×5): 100 ug via INTRAVENOUS
  Administered 2024-11-23: 50 ug via INTRAVENOUS
  Administered 2024-11-23 (×2): 100 ug via INTRAVENOUS
  Administered 2024-11-23 – 2024-11-24 (×4): 50 ug via INTRAVENOUS
  Administered 2024-11-24: 100 ug via INTRAVENOUS
  Administered 2024-11-24 (×2): 200 ug via INTRAVENOUS
  Administered 2024-11-24 – 2024-11-25 (×2): 50 ug via INTRAVENOUS
  Administered 2024-11-25 – 2024-11-26 (×8): 200 ug via INTRAVENOUS
  Administered 2024-11-26: 50 ug via INTRAVENOUS
  Administered 2024-11-26: 200 ug via INTRAVENOUS
  Administered 2024-11-26: 50 ug via INTRAVENOUS
  Administered 2024-11-26: 100 ug via INTRAVENOUS
  Administered 2024-11-26: 50 ug via INTRAVENOUS
  Administered 2024-11-26: 200 ug via INTRAVENOUS
  Administered 2024-11-27: 08:00:00 50 ug via INTRAVENOUS
  Administered 2024-11-27: 14:00:00 100 ug via INTRAVENOUS
  Administered 2024-11-27: 09:00:00 50 ug via INTRAVENOUS
  Administered 2024-11-27 – 2024-11-28 (×3): 100 ug via INTRAVENOUS
  Filled 2024-11-17 (×3): qty 2
  Filled 2024-11-17 (×2): qty 4
  Filled 2024-11-17: qty 2
  Filled 2024-11-17 (×5): qty 4
  Filled 2024-11-17 (×2): qty 2
  Filled 2024-11-17 (×2): qty 4
  Filled 2024-11-17: qty 2
  Filled 2024-11-17 (×2): qty 4
  Filled 2024-11-17: qty 2
  Filled 2024-11-17: qty 4
  Filled 2024-11-17: qty 2
  Filled 2024-11-17 (×2): qty 4
  Filled 2024-11-17 (×2): qty 2
  Filled 2024-11-17: qty 4
  Filled 2024-11-17 (×2): qty 2
  Filled 2024-11-17: qty 4
  Filled 2024-11-17 (×2): qty 2
  Filled 2024-11-17: qty 1
  Filled 2024-11-17 (×2): qty 4
  Filled 2024-11-17 (×3): qty 2
  Filled 2024-11-17: qty 4
  Filled 2024-11-17: qty 1
  Filled 2024-11-17 (×2): qty 2
  Filled 2024-11-17 (×3): qty 4

## 2024-11-17 MED ORDER — POTASSIUM & SODIUM PHOSPHATES 280-160-250 MG PO PACK
2.0000 | PACK | ORAL | Status: AC
  Administered 2024-11-17 (×2): 2
  Filled 2024-11-17 (×2): qty 2

## 2024-11-17 MED ORDER — FENTANYL CITRATE (PF) 50 MCG/ML IJ SOSY
50.0000 ug | PREFILLED_SYRINGE | INTRAMUSCULAR | Status: DC | PRN
  Administered 2024-11-17 (×3): 100 ug via INTRAVENOUS
  Filled 2024-11-17 (×3): qty 2

## 2024-11-17 MED ORDER — ACETAMINOPHEN 325 MG PO TABS
650.0000 mg | ORAL_TABLET | Freq: Four times a day (QID) | ORAL | Status: DC | PRN
  Administered 2024-11-18 – 2024-12-05 (×12): 650 mg
  Filled 2024-11-17 (×15): qty 2

## 2024-11-17 MED ORDER — FENTANYL CITRATE (PF) 50 MCG/ML IJ SOSY: 50.0000 ug | PREFILLED_SYRINGE | INTRAMUSCULAR | Status: DC | PRN

## 2024-11-17 MED ORDER — PREDNISONE 20 MG PO TABS
40.0000 mg | ORAL_TABLET | Freq: Every day | ORAL | Status: AC
  Administered 2024-11-18 – 2024-11-19 (×2): 40 mg
  Filled 2024-11-17 (×2): qty 2

## 2024-11-17 NOTE — Progress Notes (Signed)
 Waste of Fentanyl  drip with Deitra Cheshire RN- when wasting meant to write - wrote .   146ml's wasted in the pyxis with Deitra Cheshire RN and Aleck Ellen RN.

## 2024-11-17 NOTE — Plan of Care (Signed)
  Problem: Education: Goal: Knowledge of General Education information will improve Description: Including pain rating scale, medication(s)/side effects and non-pharmacologic comfort measures Outcome: Not Progressing   Problem: Health Behavior/Discharge Planning: Goal: Ability to manage health-related needs will improve Outcome: Not Progressing   Problem: Clinical Measurements: Goal: Ability to maintain clinical measurements within normal limits will improve Outcome: Not Progressing Goal: Will remain free from infection Outcome: Not Progressing Goal: Diagnostic test results will improve Outcome: Not Progressing Goal: Respiratory complications will improve Outcome: Not Progressing Goal: Cardiovascular complication will be avoided Outcome: Not Progressing   Problem: Activity: Goal: Risk for activity intolerance will decrease Outcome: Not Progressing   Problem: Nutrition: Goal: Adequate nutrition will be maintained Outcome: Not Progressing   Problem: Coping: Goal: Level of anxiety will decrease Outcome: Not Progressing   Problem: Elimination: Goal: Will not experience complications related to bowel motility Outcome: Not Progressing Goal: Will not experience complications related to urinary retention Outcome: Not Progressing   Problem: Pain Managment: Goal: General experience of comfort will improve and/or be controlled Outcome: Not Progressing   Problem: Safety: Goal: Ability to remain free from injury will improve Outcome: Not Progressing   Problem: Skin Integrity: Goal: Risk for impaired skin integrity will decrease Outcome: Not Progressing   Problem: Safety: Goal: Non-violent Restraint(s) Outcome: Not Progressing   Problem: Activity: Goal: Ability to tolerate increased activity will improve Outcome: Not Progressing   Problem: Respiratory: Goal: Ability to maintain a clear airway and adequate ventilation will improve Outcome: Not Progressing   Problem:  Role Relationship: Goal: Method of communication will improve Outcome: Not Progressing   Problem: Education: Goal: Ability to describe self-care measures that may prevent or decrease complications (Diabetes Survival Skills Education) will improve Outcome: Not Progressing

## 2024-11-17 NOTE — Progress Notes (Cosign Needed)
 Interim CCM Progress Note  Beside Ultrasound completed to evaluate right pleural effusion     Right pleural effusion, small- will hold off additional thoracentesis at this time  Patient has responded well to diuresis- > 2Liters out since administering  Repeat ABG to evaluate if hypoxia is improved   Sherlean Sharps AGACNP-BC   [facility] 11/17/2024, 4:00 PM  Please see [email or web address] for pager details.  From 7A-7P if no response, please call [phone]. After hours, please call ELink [phone].

## 2024-11-17 NOTE — Progress Notes (Signed)
 eLink Physician-Brief Progress Note Patient Name: Richard [name] DOB: 1971/04/14 MRN: [id number]   Date of Service  11/17/2024  HPI/Events of Note  53 year old male with ETOH abuse withdrawal requiring intubation for agitation.  Patient is tense and biting the endotracheal tube.  Taken off continuous fentanyl , maintained on Precedex   eICU Interventions  Increase frequency of fentanyl  as needed     Intervention Category Minor Interventions: Agitation / anxiety - evaluation and management  Ernie Sagrero 11/17/2024, 9:38 PM

## 2024-11-17 NOTE — Procedures (Signed)
 Cortrak  Person Inserting Tube:  Elihue Josette RAMAN, RD Tube Type:  Cortrak - 43 inches Tube Size:  10 Tube Location:  Left nare Secured by: Bridle Initial Placement:  Gastric Technique Used to Measure Tube Placement:  Marking at nare/corner of mouth Cortrak Secured At:  78 cm Initial Placement Verification:  Cortrak device (Registered Dieticians Only)  Cortrak Tube Team Note:  Consult received to place a Cortrak feeding tube.   No x-ray is required. RN may begin using tube.   If the tube becomes dislodged please keep the tube and contact the Cortrak team at [email or web address] for replacement.  If after hours and replacement cannot be delayed, place a NG tube and confirm placement with an abdominal x-ray.    Josette Elihue, MS, RDN, LDN Clinical Dietitian I Please reach out via secure chat
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
## 2024-11-18 ENCOUNTER — Other Ambulatory Visit: Payer: Self-pay

## 2024-11-18 LAB — COMPREHENSIVE METABOLIC PANEL WITH GFR
ALT: 12 U/L (ref 0–44)
AST: 13 U/L — ABNORMAL LOW (ref 15–41)
Albumin: 3.1 g/dL — ABNORMAL LOW (ref 3.5–5.0)
Alkaline Phosphatase: 61 U/L (ref 38–126)
Anion gap: 10 (ref 5–15)
BUN: 26 mg/dL — ABNORMAL HIGH (ref 6–20)
CO2: 29 mmol/L (ref 22–32)
Calcium: 8.3 mg/dL — ABNORMAL LOW (ref 8.9–10.3)
Chloride: 102 mmol/L (ref 98–111)
Creatinine, Ser: 0.89 mg/dL (ref 0.61–1.24)
GFR, Estimated: >60 mL/min (ref >60)
Glucose, Bld: 163 mg/dL — ABNORMAL HIGH (ref 70–99)
Potassium: 4.2 mmol/L (ref 3.5–5.1)
Sodium: 141 mmol/L (ref 135–145)
Total Bilirubin: 1.1 mg/dL (ref 0.0–1.2)
Total Protein: 6.5 g/dL (ref 6.5–8.1)

## 2024-11-18 LAB — GLUCOSE, CAPILLARY
Glucose-Capillary: 161 mg/dL — ABNORMAL HIGH (ref 70–99)
Glucose-Capillary: 164 mg/dL — ABNORMAL HIGH (ref 70–99)
Glucose-Capillary: 180 mg/dL — ABNORMAL HIGH (ref 70–99)
Glucose-Capillary: 193 mg/dL — ABNORMAL HIGH (ref 70–99)
Glucose-Capillary: 198 mg/dL — ABNORMAL HIGH (ref 70–99)
Glucose-Capillary: 213 mg/dL — ABNORMAL HIGH (ref 70–99)

## 2024-11-18 LAB — CULTURE, RESPIRATORY W GRAM STAIN

## 2024-11-18 LAB — CBC
HCT: 50.5 % (ref 39.0–52.0)
Hemoglobin: 16.5 g/dL (ref 13.0–17.0)
MCH: 32.3 pg (ref 26.0–34.0)
MCHC: 32.7 g/dL (ref 30.0–36.0)
MCV: 98.8 fL (ref 80.0–100.0)
Platelets: 209 K/uL (ref 150–400)
RBC: 5.11 MIL/uL (ref 4.22–5.81)
RDW: 16.4 % — ABNORMAL HIGH (ref 11.5–15.5)
WBC: 8 K/uL (ref 4.0–10.5)
nRBC: 0 % (ref 0.0–0.2)

## 2024-11-18 LAB — PHOSPHORUS: Phosphorus: 3.4 mg/dL (ref 2.5–4.6)

## 2024-11-18 LAB — MAGNESIUM: Magnesium: 2.3 mg/dL (ref 1.7–2.4)

## 2024-11-18 MED ORDER — QUETIAPINE FUMARATE 25 MG PO TABS
25.0000 mg | ORAL_TABLET | Freq: Once | ORAL | Status: AC
  Administered 2024-11-18: 25 mg via ORAL
  Filled 2024-11-18: qty 1

## 2024-11-18 MED ORDER — VANCOMYCIN HCL 1250 MG/250ML IV SOLN
1250.0000 mg | Freq: Two times a day (BID) | INTRAVENOUS | Status: DC
  Administered 2024-11-18 – 2024-11-20 (×4): 1250 mg via INTRAVENOUS
  Filled 2024-11-18 (×4): qty 250

## 2024-11-18 MED ORDER — FUROSEMIDE 10 MG/ML IJ SOLN
60.0000 mg | Freq: Two times a day (BID) | INTRAMUSCULAR | Status: AC
  Administered 2024-11-18 – 2024-11-19 (×2): 60 mg via INTRAVENOUS
  Filled 2024-11-18 (×2): qty 6

## 2024-11-18 MED ORDER — QUETIAPINE FUMARATE 50 MG PO TABS
50.0000 mg | ORAL_TABLET | Freq: Every day | ORAL | Status: DC
  Administered 2024-11-18: 50 mg via ORAL
  Filled 2024-11-18: qty 1
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
## 2024-11-18 NOTE — Progress Notes (Signed)
 Bedside Phillips monitor alarming for ST elevation in leads II and aVF. 12 lead ECG ordered and obtained. Normal sinus rhythm, Possible left atrial enlargement, borderline ECG was computer interpretation.

## 2024-11-19 ENCOUNTER — Inpatient Hospital Stay (HOSPITAL_COMMUNITY)

## 2024-11-19 DIAGNOSIS — J15211 Pneumonia due to Methicillin susceptible Staphylococcus aureus: Secondary | ICD-10-CM

## 2024-11-19 LAB — CBC
HCT: 51.7 % (ref 39.0–52.0)
Hemoglobin: 16.7 g/dL (ref 13.0–17.0)
MCH: 32.6 pg (ref 26.0–34.0)
MCHC: 32.3 g/dL (ref 30.0–36.0)
MCV: 101 fL — ABNORMAL HIGH (ref 80.0–100.0)
Platelets: 215 K/uL (ref 150–400)
RBC: 5.12 MIL/uL (ref 4.22–5.81)
RDW: 16.3 % — ABNORMAL HIGH (ref 11.5–15.5)
WBC: 10.1 K/uL (ref 4.0–10.5)
nRBC: 0 % (ref 0.0–0.2)

## 2024-11-19 LAB — GLUCOSE, CAPILLARY
Glucose-Capillary: 149 mg/dL — ABNORMAL HIGH (ref 70–99)
Glucose-Capillary: 163 mg/dL — ABNORMAL HIGH (ref 70–99)
Glucose-Capillary: 164 mg/dL — ABNORMAL HIGH (ref 70–99)
Glucose-Capillary: 183 mg/dL — ABNORMAL HIGH (ref 70–99)
Glucose-Capillary: 192 mg/dL — ABNORMAL HIGH (ref 70–99)
Glucose-Capillary: 196 mg/dL — ABNORMAL HIGH (ref 70–99)

## 2024-11-19 LAB — BASIC METABOLIC PANEL WITH GFR
Anion gap: 9 (ref 5–15)
BUN: 31 mg/dL — ABNORMAL HIGH (ref 6–20)
CO2: 28 mmol/L (ref 22–32)
Calcium: 8.3 mg/dL — ABNORMAL LOW (ref 8.9–10.3)
Chloride: 105 mmol/L (ref 98–111)
Creatinine, Ser: 0.95 mg/dL (ref 0.61–1.24)
GFR, Estimated: >60 mL/min (ref >60)
Glucose, Bld: 148 mg/dL — ABNORMAL HIGH (ref 70–99)
Potassium: 4.6 mmol/L (ref 3.5–5.1)
Sodium: 142 mmol/L (ref 135–145)

## 2024-11-19 LAB — CULTURE, BAL-QUANTITATIVE W GRAM STAIN: Culture: 10000 — AB

## 2024-11-19 LAB — BODY FLUID CULTURE W GRAM STAIN
Culture: NO GROWTH
Gram Stain: NONE SEEN

## 2024-11-19 LAB — MAGNESIUM: Magnesium: 2.3 mg/dL (ref 1.7–2.4)

## 2024-11-19 LAB — PHOSPHORUS: Phosphorus: 3.8 mg/dL (ref 2.5–4.6)

## 2024-11-19 MED ORDER — PHENOBARBITAL 32.4 MG PO TABS
97.2000 mg | ORAL_TABLET | Freq: Three times a day (TID) | ORAL | Status: DC
  Administered 2024-11-19 – 2024-11-20 (×4): 97.2 mg
  Filled 2024-11-19 (×5): qty 3

## 2024-11-19 MED ORDER — POLYETHYLENE GLYCOL 3350 17 G PO PACK
17.0000 g | PACK | Freq: Every day | ORAL | Status: DC | PRN
  Administered 2024-11-30 – 2024-12-03 (×2): 17 g
  Filled 2024-11-19 (×2): qty 1

## 2024-11-19 MED ORDER — PHENOBARBITAL 32.4 MG PO TABS: 97.2000 mg | ORAL_TABLET | Freq: Three times a day (TID) | ORAL | Status: DC

## 2024-11-19 MED ORDER — QUETIAPINE FUMARATE 50 MG PO TABS: 50.0000 mg | ORAL_TABLET | Freq: Every evening | ORAL | Status: DC | PRN

## 2024-11-19 MED ORDER — QUETIAPINE FUMARATE 100 MG PO TABS
100.0000 mg | ORAL_TABLET | Freq: Every day | ORAL | Status: DC
  Administered 2024-11-19 – 2024-11-23 (×5): 100 mg
  Filled 2024-11-19 (×5): qty 1

## 2024-11-19 MED ORDER — MIDAZOLAM HCL (PF) 2 MG/2ML IJ SOLN
4.0000 mg | Freq: Once | INTRAMUSCULAR | Status: AC
  Administered 2024-11-19: 4 mg via INTRAVENOUS
  Filled 2024-11-19: qty 4

## 2024-11-19 MED ORDER — DOCUSATE SODIUM 50 MG/5ML PO LIQD
100.0000 mg | Freq: Two times a day (BID) | ORAL | Status: DC | PRN
  Administered 2024-11-30 – 2024-12-03 (×2): 100 mg
  Filled 2024-11-19 (×2): qty 10

## 2024-11-19 MED ORDER — FUROSEMIDE 10 MG/ML IJ SOLN
60.0000 mg | Freq: Two times a day (BID) | INTRAMUSCULAR | Status: AC
  Administered 2024-11-19 – 2024-11-20 (×2): 60 mg via INTRAVENOUS
  Filled 2024-11-19 (×2): qty 6

## 2024-11-19 MED ORDER — QUETIAPINE FUMARATE 100 MG PO TABS: 100.0000 mg | ORAL_TABLET | Freq: Every day | ORAL | Status: DC

## 2024-11-19 NOTE — Progress Notes (Signed)
 eLink Physician-Brief Progress Note Patient Name: Boston Cookson DOB: 10-25-1971 MRN: [id number]   Date of Service  11/19/2024  HPI/Events of Note  Patient was being turned and received a bath, had a persistent coughing fit unresponsive to fentanyl  push.  Patient was biting on the tube and tachycardic with tachypnea.  Ventilator waveforms are appropriate  eICU Interventions  Continue max Precedex .  One-time Versed  push     Intervention Category Minor Interventions: Agitation / anxiety - evaluation and management  Yoselin Amerman 11/19/2024, 3:46 AM
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
## 2024-11-20 ENCOUNTER — Inpatient Hospital Stay (HOSPITAL_COMMUNITY)

## 2024-11-20 ENCOUNTER — Telehealth: Payer: Self-pay

## 2024-11-20 DIAGNOSIS — J910 Malignant pleural effusion: Secondary | ICD-10-CM | POA: Diagnosis not present

## 2024-11-20 DIAGNOSIS — J9601 Acute respiratory failure with hypoxia: Secondary | ICD-10-CM | POA: Diagnosis not present

## 2024-11-20 DIAGNOSIS — J9602 Acute respiratory failure with hypercapnia: Secondary | ICD-10-CM | POA: Diagnosis not present

## 2024-11-20 DIAGNOSIS — R531 Weakness: Secondary | ICD-10-CM | POA: Diagnosis not present

## 2024-11-20 DIAGNOSIS — R410 Disorientation, unspecified: Secondary | ICD-10-CM | POA: Diagnosis not present

## 2024-11-20 DIAGNOSIS — I5030 Unspecified diastolic (congestive) heart failure: Secondary | ICD-10-CM | POA: Diagnosis not present

## 2024-11-20 DIAGNOSIS — R042 Hemoptysis: Secondary | ICD-10-CM | POA: Diagnosis not present

## 2024-11-20 LAB — CBC
HCT: 52.1 % — ABNORMAL HIGH (ref 39.0–52.0)
Hemoglobin: 16.7 g/dL (ref 13.0–17.0)
MCH: 32.4 pg (ref 26.0–34.0)
MCHC: 32.1 g/dL (ref 30.0–36.0)
MCV: 101.2 fL — ABNORMAL HIGH (ref 80.0–100.0)
Platelets: 205 K/uL (ref 150–400)
RBC: 5.15 MIL/uL (ref 4.22–5.81)
RDW: 15.8 % — ABNORMAL HIGH (ref 11.5–15.5)
WBC: 8.3 K/uL (ref 4.0–10.5)
nRBC: 0 % (ref 0.0–0.2)

## 2024-11-20 LAB — GLUCOSE, CAPILLARY
Glucose-Capillary: 161 mg/dL — ABNORMAL HIGH (ref 70–99)
Glucose-Capillary: 166 mg/dL — ABNORMAL HIGH (ref 70–99)
Glucose-Capillary: 166 mg/dL — ABNORMAL HIGH (ref 70–99)
Glucose-Capillary: 168 mg/dL — ABNORMAL HIGH (ref 70–99)
Glucose-Capillary: 171 mg/dL — ABNORMAL HIGH (ref 70–99)
Glucose-Capillary: 179 mg/dL — ABNORMAL HIGH (ref 70–99)

## 2024-11-20 LAB — BASIC METABOLIC PANEL WITH GFR
Anion gap: 13 (ref 5–15)
BUN: 42 mg/dL — ABNORMAL HIGH (ref 6–20)
CO2: 25 mmol/L (ref 22–32)
Calcium: 8.5 mg/dL — ABNORMAL LOW (ref 8.9–10.3)
Chloride: 106 mmol/L (ref 98–111)
Creatinine, Ser: 0.96 mg/dL (ref 0.61–1.24)
GFR, Estimated: >60 mL/min (ref >60)
Glucose, Bld: 157 mg/dL — ABNORMAL HIGH (ref 70–99)
Potassium: 4.5 mmol/L (ref 3.5–5.1)
Sodium: 144 mmol/L (ref 135–145)

## 2024-11-20 LAB — MAGNESIUM: Magnesium: 2.4 mg/dL (ref 1.7–2.4)

## 2024-11-20 LAB — HEPARIN LEVEL (UNFRACTIONATED): Heparin Unfractionated: 0.19 [IU]/mL — ABNORMAL LOW (ref 0.30–0.70)

## 2024-11-20 LAB — PROCALCITONIN: Procalcitonin: 0.63 ng/mL

## 2024-11-20 MED ORDER — SODIUM CHLORIDE 0.9 % IV SOLN
3.0000 g | Freq: Three times a day (TID) | INTRAVENOUS | Status: AC
  Administered 2024-11-20 – 2024-11-24 (×14): 3 g via INTRAVENOUS
  Filled 2024-11-20 (×14): qty 8

## 2024-11-20 MED ORDER — TRANEXAMIC ACID FOR INHALATION
500.0000 mg | Freq: Three times a day (TID) | RESPIRATORY_TRACT | Status: AC
  Administered 2024-11-20 – 2024-11-22 (×6): 500 mg via RESPIRATORY_TRACT
  Filled 2024-11-20: qty 5
  Filled 2024-11-20 (×2): qty 10
  Filled 2024-11-20: qty 5
  Filled 2024-11-20: qty 10
  Filled 2024-11-20: qty 5
  Filled 2024-11-20: qty 10

## 2024-11-20 MED ORDER — GADOBUTROL 1 MMOL/ML IV SOLN
8.0000 mL | Freq: Once | INTRAVENOUS | Status: AC | PRN
  Administered 2024-11-20: 8 mL via INTRAVENOUS

## 2024-11-20 MED ORDER — ETOMIDATE 2 MG/ML IV SOLN: 20.0000 mg | Freq: Once | INTRAVENOUS | Status: AC

## 2024-11-20 MED ORDER — LACTATED RINGERS IV BOLUS
500.0000 mL | Freq: Once | INTRAVENOUS | Status: AC
  Administered 2024-11-20: 500 mL via INTRAVENOUS

## 2024-11-20 MED ORDER — ETOMIDATE 2 MG/ML IV SOLN
INTRAVENOUS | Status: AC
  Administered 2024-11-20: 20 mg
  Filled 2024-11-20: qty 10

## 2024-11-20 MED ORDER — HEPARIN (PORCINE) 25000 UT/250ML-% IV SOLN
2150.0000 [IU]/h | INTRAVENOUS | Status: DC
  Administered 2024-11-20: 950 [IU]/h via INTRAVENOUS
  Administered 2024-11-21 – 2024-11-22 (×2): 1200 [IU]/h via INTRAVENOUS
  Administered 2024-11-23: 1300 [IU]/h via INTRAVENOUS
  Administered 2024-11-24: 1400 [IU]/h via INTRAVENOUS
  Administered 2024-11-26: 1800 [IU]/h via INTRAVENOUS
  Administered 2024-11-27: 20:00:00 2150 [IU]/h via INTRAVENOUS
  Administered 2024-11-27: 05:00:00 1900 [IU]/h via INTRAVENOUS
  Administered 2024-11-28 (×2): 2150 [IU]/h via INTRAVENOUS
  Filled 2024-11-20 (×12): qty 250

## 2024-11-20 NOTE — Progress Notes (Signed)
 eLink Physician-Brief Progress Note Patient Name: Richard [name] DOB: [birth year] MRN: [id number]   Date of Service  11/20/2024  HPI/Events of Note  Borderline SBP though MAP is ok. On preceedex. No distress. On 40%/peep 8. Very negative on fluid balance.   eICU Interventions  500 cc LR to be given over 1 hour      Intervention Category Major Interventions: Hypotension - evaluation and management  Andree Heeg G Emmer Lillibridge 11/20/2024, 1:31 AM

## 2024-11-20 NOTE — Progress Notes (Signed)
 Pt transported on the vent from 4N26 to MRI and back without complication. RT, RN and transport accompanied pt.

## 2024-11-20 NOTE — Progress Notes (Signed)
 Pt transported on the vent from 4N 26 to Radiology and back without complication. RT and RN x2 accompanied pt.

## 2024-11-20 NOTE — Progress Notes (Signed)
 MRI neg. Heparin  challenge.

## 2024-11-20 NOTE — Progress Notes (Signed)
   11/20/24 0755  Daily Weaning Assessment  Daily Assessment of Readiness to Wean Wean protocol criteria met (SBT performed)  SBT Method CPAP 5 cm H20 and PS 5 cm H20 (12/5)  Weaning Start Time 9244  Patient response Failed SBT terminated  Reason SBT Terminated RR > 35 breaths/min or Frequency/TV ratio >105 (low tidal volumes despite ps titration, RR up to 44.)
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
## 2024-11-20 NOTE — Telephone Encounter (Signed)
 Received a call from Tyra, RN with [facility], providing a courtesy update regarding the patient. She reported that the patient has been admitted to [facility].  I informed her that I would notify Dr. Sherrod. Tyra voiced appreciation.

## 2024-11-20 NOTE — Progress Notes (Signed)
 PHARMACY - ANTICOAGULATION CONSULT NOTE  Pharmacy Consult:  Heparin  Indication: Acute femoral DVT  Allergies  Allergen Reactions   Dilaudid  [Hydromorphone  Hcl] Nausea Only and Other (See Comments)    Bradycardia  Hyperthermia    Tape Itching and Dermatitis    Patient Measurements: Height: 5' 9 (175.3 cm) Weight: 83 kg (182 lb 15.7 oz) IBW/kg (Calculated) : 70.7 HEPARIN  DW (KG): 83  Vital Signs: Temp: 100.2 F (37.9 C) (12/08 2000) Temp Source: Axillary (12/08 2000) BP: 102/77 (12/08 2200) Pulse Rate: 104 (12/08 2200)  Labs: Recent Labs    11/18/24 0445 11/19/24 0448 11/20/24 0449 11/20/24 2309  HGB 16.5 16.7 16.7  --   HCT 50.5 51.7 52.1*  --   PLT 209 215 205  --   HEPARINUNFRC  --   --   --  0.19*  CREATININE 0.89 0.95 0.96  --     Estimated Creatinine Clearance: 88 mL/min (by C-G formula based on SCr of 0.96 mg/dL).  Assessment: 37 YOM presented with AWS, PNA and pleural effusion.  Bronch on 12/8 concern for recurrent cancer.  Doppler positive for femoral DVT and Pharmacy consulted to dose IV heparin  if MRI is negative for mets.  Prophylactic Lovenox  discontinued on 12/7 due to bleeding.  RN reports scant to moderate bleeding with suctioning and mouth care.  TXA nebs ordered x 2 days.  Will be conservative with heparin  dosing if it is to be started.  CBC stable.  PM: MRI negative for brain mets, okay to start heparin . First Heparin  level below goal on 950 units/hr. Per RN, no issues with the heparin  gtt running continuously or signs/symptoms bleeding.  Goal of Therapy:  Heparin  level 0.3-0.5 units/ml Monitor platelets by anticoagulation protocol: Yes   Plan:  No bolus Heparin  gtt at 1100 units/hr Check 6 hr heparin  level Daily heparin  level and CBC Monitor closely for bleeding worsening/resolution  Lynwood Poplar, PharmD, BCPS Clinical Pharmacist 11/20/2024 11:33 PM

## 2024-11-20 NOTE — Plan of Care (Signed)
    Progress Note from the Palliative Medicine Team at [facility]   Patient Name: Richard [name]        Date: 11/20/2024 DOB: 1971/07/17  Age: 53 y.o. MRN#: [id number] Attending Physician: Claudene Toribio BROCKS, MD Primary Care Physician: Sim Emery CROME, MD Admit Date: 11/14/2024   Received consult for goals of care.  Unfortunately patient remains without capacity to make medical decisions secondary to complex medical situation.  Staff's been unable to contact family.  I self attempted to contact noted spouse to Richard [name] at listed [phone], unable to leave voicemail, mailbox is full.  Spoke to bedside RN.  Contact me if any family come to bedside.  I will follow-up again tomorrow for opportunity to speak with family regarding goals of care.  No charge  [name] NP  Palliative Medicine Team Team Phone # [phone] Pager [phone]

## 2024-11-20 NOTE — Progress Notes (Addendum)
 PHARMACY - ANTICOAGULATION CONSULT NOTE  Pharmacy Consult:  Heparin  Indication: Acute femoral DVT  Allergies  Allergen Reactions   Dilaudid  [Hydromorphone  Hcl] Nausea Only and Other (See Comments)    Bradycardia  Hyperthermia    Tape Itching and Dermatitis    Patient Measurements: Height: 5' 9 (175.3 cm) Weight: 83 kg (182 lb 15.7 oz) IBW/kg (Calculated) : 70.7 HEPARIN  DW (KG): 83  Vital Signs: Temp: 99.9 F (37.7 C) (12/08 1153) Temp Source: Axillary (12/08 1153) BP: 92/77 (12/08 1300) Pulse Rate: 101 (12/08 1300)  Labs: Recent Labs    11/18/24 0445 11/19/24 0448 11/20/24 0449  HGB 16.5 16.7 16.7  HCT 50.5 51.7 52.1*  PLT 209 215 205  CREATININE 0.89 0.95 0.96    Estimated Creatinine Clearance: 88 mL/min (by C-G formula based on SCr of 0.96 mg/dL).   Medical History: Past Medical History:  Diagnosis Date   Asthma    Brain bleed (HCC)    CHF (congestive heart failure) (HCC)    Coronary artery disease    DVT (deep venous thrombosis) (HCC) 02/19/2015   RLE   GERD (gastroesophageal reflux disease)    History of home oxygen  therapy    Hypertension    Seizures (HCC)    last episode 03/2013   Stroke [facility])     Assessment: 53 YOM presented with AWS, PNA and pleural effusion.  Bronch on 12/8 concern for recurrent cancer.  Doppler positive for femoral DVT and Pharmacy consulted to dose IV heparin  if MRI is negative for mets.  Prophylactic Lovenox  discontinued on 12/7 due to bleeding.  RN reports scant to moderate bleeding with suctioning and mouth care.  TXA nebs ordered x 2 days.  Will be conservative with heparin  dosing if it is to be started.  CBC stable.  Goal of Therapy:  Heparin  level 0.3-0.5 units/ml Monitor platelets by anticoagulation protocol: Yes   Plan:  If MRI negative for mets or bleeding, then start IV heparin  No bolus Heparin  gtt at 950 units/hr Check 6 hr heparin  level Daily heparin  level and CBC Monitor closely for bleeding  worsening/resolution  Dajion Bickford D. Lendell, PharmD, BCPS, BCCCP 11/20/2024, 2:10 PM

## 2024-11-20 NOTE — Progress Notes (Signed)
 VASCULAR LAB    Bilateral lower extremity venous duplex has been performed.  See CV proc for preliminary results.   Jhonny Calixto, RVT 11/20/2024, 1:13 PM

## 2024-11-20 NOTE — Progress Notes (Signed)
+   femoral DVT: if MRI brain neg will start full AC challenge If mets or does not tolerate challenge, needs IVC filter  Rolan Sharps MD PCCM

## 2024-11-20 NOTE — Procedures (Signed)
 Bronchoscopy Procedure Note  Richard [name]  [id number]  June 20, 1971  Date:11/20/24  Time:9:17 AM   Provider Performing:Yury Schaus C Claudene   Procedure(s):  Flexible bronchoscopy with bronchial alveolar lavage (68375) and Initial Therapeutic Aspiration of Tracheobronchial Tree (68354)  Indication(s) Hx cancer, hemoptysis  Consent Unable to obtain consent due to emergent nature of procedure. Called family, no answer on every number  Anesthesia In place for ETT + etomidate  x 1   Time Out Verified patient identification, verified procedure, site/side was marked, verified correct patient position, special equipment/implants available, medications/allergies/relevant history reviewed, required imaging and test results available.   Sterile Technique Usual hand hygiene, masks, gowns, and gloves were used   Procedure Description Bronchoscope advanced through endotracheal tube and into airway.  Airways were examined down to subsegmental level with findings noted below.   Following diagnostic evaluation, BAL(s) performed in R bainstem with normal saline and return of bloody plug filled fluid  Findings:  - ETT partially occluded with blood clot: suctioned - Posterior portion of R mainstem abnormal most c/w recurrent cancer   Complications/Tolerance None; patient tolerated the procedure well. Chest X-ray is not needed post procedure.   EBL Minimal   Specimen(s) R mainstem: culture and cyto

## 2024-11-21 DIAGNOSIS — J9601 Acute respiratory failure with hypoxia: Secondary | ICD-10-CM | POA: Diagnosis not present

## 2024-11-21 DIAGNOSIS — I5030 Unspecified diastolic (congestive) heart failure: Secondary | ICD-10-CM | POA: Diagnosis not present

## 2024-11-21 DIAGNOSIS — R410 Disorientation, unspecified: Secondary | ICD-10-CM | POA: Diagnosis not present

## 2024-11-21 DIAGNOSIS — J9602 Acute respiratory failure with hypercapnia: Secondary | ICD-10-CM | POA: Diagnosis not present

## 2024-11-21 LAB — DRUG PROFILE, UR, 9 DRUGS (LABCORP)
Amphetamines, Urine: NEGATIVE ng/mL
Cannabinoid Quant, Ur: NEGATIVE ng/mL
Cocaine (Metab.): NEGATIVE ng/mL
Creatinine, Urine: 70.8 mg/dL (ref 20.0–300.0)
Methadone Screen, Urine: NEGATIVE ng/mL
Nitrite Urine, Quantitative: NEGATIVE ug/mL
OPIATE SCREEN URINE: NEGATIVE ng/mL
Phencyclidine, Ur: NEGATIVE ng/mL
Propoxyphene, Urine: NEGATIVE ng/mL
pH, Urine: 4.6 (ref 4.5–8.9)

## 2024-11-21 LAB — CBC
HCT: 51.6 % (ref 39.0–52.0)
Hemoglobin: 16.3 g/dL (ref 13.0–17.0)
MCH: 32.9 pg (ref 26.0–34.0)
MCHC: 31.6 g/dL (ref 30.0–36.0)
MCV: 104.2 fL — ABNORMAL HIGH (ref 80.0–100.0)
Platelets: 187 K/uL (ref 150–400)
RBC: 4.95 MIL/uL (ref 4.22–5.81)
RDW: 15.6 % — ABNORMAL HIGH (ref 11.5–15.5)
WBC: 11.4 K/uL — ABNORMAL HIGH (ref 4.0–10.5)
nRBC: 0 % (ref 0.0–0.2)

## 2024-11-21 LAB — GLUCOSE, CAPILLARY
Glucose-Capillary: 134 mg/dL — ABNORMAL HIGH (ref 70–99)
Glucose-Capillary: 134 mg/dL — ABNORMAL HIGH (ref 70–99)
Glucose-Capillary: 138 mg/dL — ABNORMAL HIGH (ref 70–99)
Glucose-Capillary: 150 mg/dL — ABNORMAL HIGH (ref 70–99)
Glucose-Capillary: 157 mg/dL — ABNORMAL HIGH (ref 70–99)
Glucose-Capillary: 160 mg/dL — ABNORMAL HIGH (ref 70–99)

## 2024-11-21 LAB — HEPARIN LEVEL (UNFRACTIONATED)
Heparin Unfractionated: 0.26 [IU]/mL — ABNORMAL LOW (ref 0.30–0.70)
Heparin Unfractionated: 0.3 [IU]/mL (ref 0.30–0.70)

## 2024-11-21 LAB — CYTOLOGY - NON PAP

## 2024-11-21 LAB — BASIC METABOLIC PANEL WITH GFR
Anion gap: 13 (ref 5–15)
BUN: 56 mg/dL — ABNORMAL HIGH (ref 6–20)
CO2: 24 mmol/L (ref 22–32)
Calcium: 7.9 mg/dL — ABNORMAL LOW (ref 8.9–10.3)
Chloride: 105 mmol/L (ref 98–111)
Creatinine, Ser: 1.28 mg/dL — ABNORMAL HIGH (ref 0.61–1.24)
GFR, Estimated: >60 mL/min (ref >60)
Glucose, Bld: 130 mg/dL — ABNORMAL HIGH (ref 70–99)
Potassium: 5.2 mmol/L — ABNORMAL HIGH (ref 3.5–5.1)
Sodium: 142 mmol/L (ref 135–145)

## 2024-11-21 LAB — DRUG PROFILE 799023
Amobarbital, Ur: NEGATIVE
BARBITURATES: POSITIVE — AB
Butalbital, Ur: NEGATIVE
Pentobarbital, Ur: NEGATIVE
Phenobarbital, GC/MS: 2634 ng/mL
Phenobarbital,Ur: POSITIVE — AB
Secobarbital, Ur: NEGATIVE

## 2024-11-21 LAB — DRUG PROFILE 799031: BENZODIAZEPINES: NEGATIVE

## 2024-11-21 MED ORDER — LACTATED RINGERS IV BOLUS
500.0000 mL | Freq: Once | INTRAVENOUS | Status: AC
  Administered 2024-11-21: 500 mL via INTRAVENOUS

## 2024-11-21 MED ORDER — PHENOBARBITAL 32.4 MG PO TABS
64.8000 mg | ORAL_TABLET | Freq: Three times a day (TID) | ORAL | Status: AC
  Administered 2024-11-21 – 2024-11-23 (×6): 64.8 mg
  Filled 2024-11-21 (×6): qty 2

## 2024-11-21 MED ORDER — PHENOBARBITAL 32.4 MG PO TABS
32.4000 mg | ORAL_TABLET | Freq: Three times a day (TID) | ORAL | Status: DC
  Administered 2024-11-21: 32.4 mg
  Filled 2024-11-21: qty 1

## 2024-11-21 MED ORDER — SODIUM ZIRCONIUM CYCLOSILICATE 10 G PO PACK
10.0000 g | PACK | Freq: Once | ORAL | Status: AC
  Administered 2024-11-21: 10 g
  Filled 2024-11-21: qty 1

## 2024-11-21 MED ORDER — PHENOBARBITAL 32.4 MG PO TABS
32.4000 mg | ORAL_TABLET | Freq: Three times a day (TID) | ORAL | Status: AC
  Administered 2024-11-23 – 2024-11-25 (×6): 32.4 mg
  Filled 2024-11-21 (×6): qty 1

## 2024-11-21 NOTE — Progress Notes (Deleted)
 Internal Medicine Teaching Service to assume care at 7AM on 12/10.

## 2024-11-21 NOTE — Progress Notes (Signed)
 PT Cancellation Note  Patient Details Name: Richard [name] MRN: [id number] DOB: 11/05/1971   Cancelled Treatment:    Reason Eval/Treat Not Completed: Medical issues which prohibited therapy. Pt with regression from respiratory status. Palliative working with family with planned GOC meeting today or tomorrow. Acute PT to return as able, as appropriate to complete PT Eval.  Shanel Prazak, PT, DPT Acute Rehabilitation Services Secure chat preferred Office #: [phone]    [name] 11/21/2024, 10:24 AM

## 2024-11-21 NOTE — Consult Note (Signed)
 Consultation Note Date: 11/21/2024   Patient Name: Richard [name]  DOB: Dec 31, 1970  MRN: [id number]  Age / Sex: 53 y.o., male  PCP: Sim Emery CROME, MD Referring Physician: Claudene Toribio BROCKS, MD  Reason for Consultation: Establishing goals of care  HPI/Patient Profile: 53 y.o. male   admitted on 11/14/2024.  Neighbors found patient down, EMS called, patient brought to hospice with resistance to EMS.  He was ultimately IVC to remain in the hospital.  Dense psychosocial history  Admitted for treatment and stabilization  Admitted to ICU 12/3 12/4 Intubated, bronch with BAL, right thoracentesis obtained  12/5 Phenobarb taper restarted yesterday, has helped, currently weaning off fentanyl   12/8 failed SBT, bronch >> looks like has endobronchial involvement cancer; MRI brain/C spine no mets  Family face treatment option decisions, advanced directive decisions and anticipatory care needs.    Clinical Assessment and Goals of Care:  Discussed case in detail with CCM  This NP [name] reviewed medical records, received report from team, assessed the patient and then spoke to patient's wife by telephone to discuss diagnosis, prognosis, GOC, and options.   Concept of Palliative Care was introduced as specialized medical care for people and their families living with serious illness.  If focuses on providing relief from the symptoms and stress of a serious illness.  The goal is to improve quality of life for both the patient and the family.Values and goals of care important to patient and family were attempted to be elicited.  Created space and opportunity for wife to explore thoughts and feelings regarding current medical situation.  Ms. eliazer hemphill an understanding that her husband's medical situation is serious however could only really speak to her knowledge that he has cancer.  She tells me that he  missed his scheduled PET scan and was hoping to reschedule it. I shared with her noted noncompliance dating back since June 2024  She verbalizes some concern with in home caregivers, and its effect on her husband's compliance with treatment.  She recognizes ongoing inappropriate alcohol use.    Education offered today regarding the seriousness of patient's current medical situation specific to his underlying cancer diagnosis, and overall failure to thrive .  Education offered on the seriousness of his current medical situation requiring ventilator support   A  discussion/education  was had today regarding advanced directives.  Concepts specific to code status, artifical feeding and hydration, continued IV antibiotics and rehospitalization was had.    The difference between a aggressive medical intervention path  and a palliative comfort care path for this patient at this time was had.  I shared with wife my concern for patient's long term prognosis, specifically as it relates to quality of life..  Brief discussion regarding possibility of need for tracheostomy and artificial feeding and hydration source.       Suggested in person meeting with both myself and CCM.  Wife agrees and plan is to meet tomorrow morning at 11:00 at bedside    I left a voicemail for patient's sister  Stephane Louder, offering her inclusion in tomorrow morning's family meeting.    Family encouraged to call with questions or concerns.     PMT will continue to support holistically.          No documented H POA or advance care planning documents noted  Next of kin/wife is decision maker at this time       SUMMARY OF RECOMMENDATIONS    Code Status/Advance Care Planning: Full code   Symptom Management:  Per attending  Palliative Prophylaxis:  Aspiration, Delirium Protocol, Frequent Pain Assessment, and Oral Care  Additional Recommendations (Limitations, Scope, Preferences): Full Scope  Treatment  Psycho-social/Spiritual:  Desire for further Chaplaincy support:no   Prognosis:  Unable to determine  Discharge Planning: To Be Determined      Primary Diagnoses: Present on Admission:  Delirium  Acute on chronic respiratory failure with hypoxia and hypercapnia (HCC)   I have reviewed the medical record, interviewed the patient and family, and examined the patient. The following aspects are pertinent.  Past Medical History:  Diagnosis Date   Asthma    Brain bleed (HCC)    CHF (congestive heart failure) (HCC)    Coronary artery disease    DVT (deep venous thrombosis) (HCC) 02/19/2015   RLE   GERD (gastroesophageal reflux disease)    History of home oxygen  therapy    Hypertension    Seizures (HCC)    last episode 03/2013   Stroke [facility])    Social History   Socioeconomic History   Marital status: Married    Spouse name: Olam   Number of children: 1   Years of education: HS   Highest education level: Not on file  Occupational History    Employer: OTHER    Comment: n/a  Tobacco Use   Smoking status: Every Day    Current packs/day: 1.00    Average packs/day: 1 pack/day for 38.9 years (38.9 ttl pk-yrs)    Types: Cigarettes    Start date: 80   Smokeless tobacco: Never   Tobacco comments:    Pt states he smokes some days  Vaping Use   Vaping status: Never Used  Substance and Sexual Activity   Alcohol use: Yes   Drug use: Not Currently    Types: Cocaine   Sexual activity: Yes  Other Topics Concern   Not on file  Social History Narrative   Patient lives at home alone.   Caffeine Use: 4-5 cups daily   Social Drivers of Health   Financial Resource Strain: Medium Risk (09/17/2023)   Overall Financial Resource Strain (CARDIA)    Difficulty of Paying Living Expenses: Somewhat hard  Food Insecurity: No Food Insecurity (09/23/2024)   Hunger Vital Sign    Worried About Running Out of Food in the Last Year: Never true    Ran Out of Food in the Last  Year: Never true  Transportation Needs: Unmet Transportation Needs (09/23/2024)   PRAPARE - Administrator, Civil Service (Medical): Yes    Lack of Transportation (Non-Medical): No  Physical Activity: Not on File (08/30/2023)   Received from [facility]   Physical Activity    Physical Activity: 0  Stress: Not on File (08/30/2023)   Received from [facility]   Stress    Stress: 0  Social Connections: Not on File (08/30/2023)   Received from [facility]   Social Connections    Connectedness: 0   Family History  Problem Relation Age of Onset   Cancer Father  Diabetes Mellitus II Sister    Scheduled Meds:  arformoterol   15 mcg Nebulization BID   budesonide  (PULMICORT ) nebulizer solution  0.25 mg Nebulization BID   Chlorhexidine  Gluconate Cloth  6 each Topical Daily   feeding supplement (PROSource TF20)  60 mL Per Tube BID   folic acid   1 mg Per Tube Daily   insulin  aspart  0-9 Units Subcutaneous Q4H   levETIRAcetam   500 mg Per Tube BID   multivitamin with minerals  1 tablet Per Tube Daily   mouth rinse  15 mL Mouth Rinse Q2H   pantoprazole  (PROTONIX ) IV  40 mg Intravenous Daily   phenobarbital   97.2 mg Per Tube TID   QUEtiapine   100 mg Per Tube QHS   revefenacin   175 mcg Nebulization Daily   sertraline   25 mg Per Tube Daily   tranexamic acid   500 mg Nebulization Q8H   Continuous Infusions:  ampicillin -sulbactam (UNASYN ) IV Stopped (11/21/24 0526)   dexmedetomidine  (PRECEDEX ) IV infusion 0.3 mcg/kg/hr (11/21/24 0600)   feeding supplement (OSMOLITE 1.5 CAL) 65 mL/hr at 11/21/24 0600   heparin  1,200 Units/hr (11/21/24 0720)   PRN Meds:.acetaminophen , docusate, fentaNYL  (SUBLIMAZE ) injection, ipratropium-albuterol , mouth rinse, polyethylene glycol, QUEtiapine  Medications Prior to Admission:  Prior to Admission medications   Medication Sig Start Date End Date Taking? Authorizing Provider  albuterol  (PROVENTIL ) (2.5 MG/3ML) 0.083% nebulizer solution Take 3 mLs (2.5 mg total) by  nebulization 3 (three) times daily as needed for shortness of breath or wheezing. 09/25/24   Fairy Frames, MD  albuterol  (VENTOLIN  HFA) 108 [phone] Base) MCG/ACT inhaler Inhale 2 puffs into the lungs every 4 (four) hours as needed for wheezing or shortness of breath. 07/31/24   Cottie Donnice PARAS, MD  aspirin  81 MG chewable tablet Chew 81 mg by mouth daily.    [provider]  cloNIDine  (CATAPRES ) 0.1 MG tablet Take 1 tablet (0.1 mg total) by mouth daily. Take 2 tablets (0.2mg ) by mouth in the morning and evening (0900 and 1700) and take 1 tablet (0.1mg ) at bedtime. 09/25/24   Fairy Frames, MD  furosemide  (LASIX ) 40 MG tablet Take 1 tablet (40 mg total) by mouth daily. Patient taking differently: Take 40 mg by mouth 2 (two) times daily. 05/22/24 10/21/24  [place], Eric J, DO  levETIRAcetam  (KEPPRA ) 500 MG tablet Take 1 tablet (500 mg total) by mouth 2 (two) times daily. 01/17/23   Norrine Sharper, MD  losartan  (COZAAR ) 100 MG tablet Take 100 mg by mouth daily. 12/31/22   [provider]  sertraline  (ZOLOFT ) 25 MG tablet Take 25 mg by mouth daily. 07/23/23   [provider]  spironolactone  (ALDACTONE ) 25 MG tablet Take 1 tablet (25 mg total) by mouth daily. 09/25/24   Fairy Frames, MD  SYMBICORT  160-4.5 MCG/ACT inhaler Inhale 2 puffs into the lungs. 10/22/24   [provider]  TRELEGY ELLIPTA  100-62.5-25 MCG/ACT AEPB Inhale 1 puff into the lungs daily. 07/05/24   [provider]   Allergies  Allergen Reactions   Dilaudid  [Hydromorphone  Hcl] Nausea Only and Other (See Comments)    Bradycardia  Hyperthermia    Tape Itching and Dermatitis   Review of Systems  Unable to perform ROS: Intubated    Physical Exam Constitutional:      Interventions: He is intubated.  Pulmonary:     Effort: He is intubated.  Skin:    General: Skin is warm and dry.     Vital Signs: BP (!) 123/93   Pulse (!) 116   Temp  100.1 F (37.8 C) (Axillary)   Resp (!) 22   Ht  5' 9 (1.753 m)   Wt 82.7 kg   SpO2 95%   BMI 26.92 kg/m  Pain Scale: CPOT   Pain Score: Asleep   SpO2: SpO2: 95 % O2 Device:SpO2: 95 % O2 Flow Rate: .O2 Flow Rate (L/min): 70 L/min  IO: Intake/output summary:  Intake/Output Summary (Last 24 hours) at 11/21/2024 9096 Last data filed at 11/21/2024 0600 Gross per 24 hour  Intake 2647.64 ml  Output 1790 ml  Net 857.64 ml    LBM: Last BM Date : 11/21/24 Baseline Weight: Weight: 88 kg Most recent weight: Weight: 82.7 kg     Palliative Assessment/Data:     Time: 75 minutes  Discussed with Dr Claudene   Signed by: [name], NP   Please contact Palliative Medicine Team phone at [phone] for questions and concerns.  For individual provider: See Tracey

## 2024-11-21 NOTE — TOC Progression Note (Signed)
 Transition of Care [facility]) - Progression Note    Patient Details  Name: Richard [name] MRN: [id number] Date of Birth: 02/07/1971  [facility] CM/SW Contact  Lovie Zarling M, RN Phone Number: 11/21/2024, 4:49 PM  Clinical Narrative:    Received call from Tyra with [facility] to discuss discharge and get an update on patient condition. [phone])  Patient followed by [facility] as a benefit of his insurance.  Case Manager given update on patient; she will call back at a later date to check status.      Barriers to Discharge: Continued Medical Work up               Expected Discharge Plan and Services   Discharge Planning Services: CM Consult                                           Social Drivers of Health (SDOH) Interventions SDOH Screenings   Food Insecurity: No Food Insecurity (09/23/2024)  Housing: Low Risk  (09/23/2024)  Transportation Needs: Unmet Transportation Needs (09/23/2024)  Utilities: Not At Risk (09/23/2024)  Financial Resource Strain: Medium Risk (09/17/2023)  Physical Activity: Not on File (08/30/2023)   Received from [facility]  Social Connections: Not on File (08/30/2023)   [facility]  Stress: Not on File (08/30/2023)   Received from [facility]  Tobacco Use: High Risk (11/14/2024)    Readmission Risk Interventions    09/25/2024    9:55 AM  Readmission Risk Prevention Plan  Transportation Screening Complete  Medication Review (RN Care Manager) Complete  PCP or Specialist appointment within 3-5 days of discharge Complete  HRI or Home Care Consult Complete  Palliative Care Screening Not Applicable  Skilled Nursing Facility Not Applicable   Mliss MICAEL Fass, RN, BSN  Trauma/Neuro ICU Case Manager [phone]

## 2024-11-21 NOTE — Progress Notes (Signed)
 PHARMACY - ANTICOAGULATION CONSULT NOTE  Pharmacy Consult:  Heparin  Indication: Acute femoral DVT  Allergies  Allergen Reactions   Dilaudid  [Hydromorphone  Hcl] Nausea Only and Other (See Comments)    Bradycardia  Hyperthermia    Tape Itching and Dermatitis    Patient Measurements: Height: 5' 9 (175.3 cm) Weight: 82.7 kg (182 lb 5.1 oz) IBW/kg (Calculated) : 70.7 HEPARIN  DW (KG): 83  Vital Signs: Temp: 100.2 F (37.9 C) (12/09 0400) Temp Source: Axillary (12/09 0400) BP: 110/78 (12/09 0600) Pulse Rate: 103 (12/09 0600)  Labs: Recent Labs    11/19/24 0448 11/20/24 0449 11/20/24 2309 11/21/24 0511  HGB 16.7 16.7  --  16.3  HCT 51.7 52.1*  --  51.6  PLT 215 205  --  187  HEPARINUNFRC  --   --  0.19* 0.26*  CREATININE 0.95 0.96  --  1.28*    Estimated Creatinine Clearance: 66 mL/min (A) (by C-G formula based on SCr of 1.28 mg/dL (H)).  Assessment: 86 YOM presented with AWS, PNA and pleural effusion.  Bronch on 12/8 concern for recurrent cancer.  Doppler positive for femoral DVT and Pharmacy consulted to dose IV heparin  as MRI is negative for mets.  Heparin  level slightly sub-therapeutic at 0.26 units/mL on 1100 units/hr.  RN reports no obvious bleeding; bleeding from suctioning appears to be resolving.  CBC stable.  Goal of Therapy:  Heparin  level 0.3-0.5 units/ml Monitor platelets by anticoagulation protocol: Yes   Plan:  No bolus Increase heparin  gtt to 1200 units/hr Check 6 hr heparin  level Daily heparin  level and CBC Monitor closely for bleeding worsening/resolution  Samanta Gal D. Lendell, PharmD, BCPS, BCCCP 11/21/2024, 7:18 AM

## 2024-11-21 NOTE — Progress Notes (Signed)
 PHARMACY - ANTICOAGULATION CONSULT NOTE  Pharmacy Consult:  Heparin  Indication: Acute femoral DVT  Allergies  Allergen Reactions   Dilaudid  [Hydromorphone  Hcl] Nausea Only and Other (See Comments)    Bradycardia  Hyperthermia    Tape Itching and Dermatitis    Patient Measurements: Height: 5' 9 (175.3 cm) Weight: 82.7 kg (182 lb 5.1 oz) IBW/kg (Calculated) : 70.7 HEPARIN  DW (KG): 83  Vital Signs: Temp: 100.7 F (38.2 C) (12/09 1134) Temp Source: Axillary (12/09 1134) BP: 101/90 (12/09 1515) Pulse Rate: 107 (12/09 1515)  Labs: Recent Labs    11/19/24 0448 11/20/24 0449 11/20/24 2309 11/21/24 0511 11/21/24 1439  HGB 16.7 16.7  --  16.3  --   HCT 51.7 52.1*  --  51.6  --   PLT 215 205  --  187  --   HEPARINUNFRC  --   --  0.19* 0.26* 0.30  CREATININE 0.95 0.96  --  1.28*  --     Estimated Creatinine Clearance: 66 mL/min (A) (by C-G formula based on SCr of 1.28 mg/dL (H)).  Assessment: 25 YOM presented with AWS, PNA and pleural effusion.  Bronch on 12/8 concern for recurrent cancer.  Doppler positive for femoral DVT and Pharmacy consulted to dose IV heparin  as MRI is negative for mets.  Heparin  level therapeutic at 0.30 units/mL on 1200 units/hr.  No obvious bleeding; bleeding from suctioning appears to be resolving.  CBC stable.  Goal of Therapy:  Heparin  level 0.3-0.5 units/ml Monitor platelets by anticoagulation protocol: Yes   Plan:  Continue heparin  gtt to 1200 units/hr Daily heparin  level and CBC Monitor closely for bleeding worsening/resolution  Harlene Boga, PharmD Please refer to [facility] for [facility] Pharmacy numbers 11/21/2024, 3:47 PM

## 2024-11-21 NOTE — Progress Notes (Signed)
 eLink Physician-Brief Progress Note Patient Name: Jorgen Wolfinger DOB: 1971-01-01 MRN: [id number]   Date of Service  11/21/2024  HPI/Events of Note  Notified of BP 85/74 BP reported to have been borderline low overnight.  I/OS show pt to be net negative 5L since admission.   eICU Interventions  Will give 500ml LR bolus now.  Will follow response.         Celestial Barnfield M DELA CRUZ 11/21/2024, 5:26 AM

## 2024-11-21 NOTE — TOC Initial Note (Addendum)
 Transition of Care [facility]) - Initial/Assessment Note    Patient Details  Name: Richard [name] MRN: [id number] Date of Birth: 1971-04-22  Transition of Care [facility]) CM/SW Contact:    Etty Isaac E Jaxtin Raimondo, LCSW Phone Number: 11/21/2024, 8:35 AM  Clinical Narrative:                 Patient was admitted after being found down at home, refusing to wear oxygen . Patient was combative with staff in the ED. Patient was medically IVC'd in the ED to keep patient in the hospital This will expire today 12/9. However patient is currently intubated. Confirmed with MD the IVC does not need to be renewed at this time. Palliative is trying to reach spouse regarding goals of care. Please reach out to ICM if assistance is needed.     Barriers to Discharge: Continued Medical Work up   Patient Goals and CMS Choice            Expected Discharge Plan and Services                                              Prior Living Arrangements/Services              Need for Family Participation in Patient Care: Yes (Comment) Care giver support system in place?: Yes (comment)   Criminal Activity/Legal Involvement Pertinent to Current Situation/Hospitalization: No - Comment as needed  Activities of Daily Living      Permission Sought/Granted                  Emotional Assessment       Orientation: : Fluctuating Orientation (Suspected and/or reported Sundowners) Alcohol / Substance Use: Not Applicable Psych Involvement: No (comment)  Admission diagnosis:  Delirium [R41.0] Patient Active Problem List   Diagnosis Date Noted   Malnutrition of moderate degree 11/16/2024   ETOH abuse 11/16/2024   Delirium 11/15/2024   Palliative care by specialist 11/15/2024   Acute hypoxemic respiratory failure (HCC) 09/21/2024   Pleural effusion on right 09/11/2024   Diastolic congestive heart failure (HCC) 08/22/2024   Bilateral lower extremity edema 08/22/2024   History of lung cancer 08/22/2024    History of DVT (deep vein thrombosis) 08/22/2024   Polysubstance abuse (HCC) 08/22/2024   Elevated troponin 05/19/2024   Cocaine use 05/19/2024   Encounter for smoking cessation counseling 05/19/2024   Malignant neoplasm of overlapping sites of lung (HCC) 09/17/2023   Pneumonia of left lung due to infectious organism 09/17/2023   Acute on chronic respiratory failure with hypoxia and hypercapnia (HCC) 09/17/2023   CAP (community acquired pneumonia) 09/16/2023   H/O: CVA (cerebrovascular accident) 09/16/2023   H/O deep vein thrombophlebitis of lower extremity 09/16/2023   Acute respiratory failure with hypoxia (HCC) 09/16/2023   Left leg swelling 01/15/2023   Knee pain, left 01/11/2023   Acute hypoxic respiratory failure (HCC) 01/10/2023   Acute on chronic heart failure with preserved ejection fraction (HFpEF, >= 50%) (HCC) 01/10/2023   Acute bronchiolitis due to respiratory syncytial virus (RSV) 01/10/2023   History of seizures 01/10/2023   Hypertension 12/25/2022   Malignant neoplasm of right lung (HCC) 12/25/2022   Rash 06/04/2022   Chronic respiratory failure with hypoxia (HCC) 06/04/2022   Mediastinal lymphadenopathy    Obstructive pneumonia    Counseling regarding advance care planning and goals of care    Malignant neoplasm  of overlapping sites of right lung (HCC) 02/03/2022   COPD with acute exacerbation (HCC) 01/28/2022   Right sided weakness 10/13/2021   Mass of right lung 04/09/2020   Alcohol abuse with intoxication 03/04/2020   Hypoxia 03/04/2020   Swollen testicle    Atelectasis    Syncope 08/26/2015   Acute encephalopathy 08/26/2015   Seizure (HCC)    Acute pulmonary edema (HCC)    Lower leg DVT (deep venous thromboembolism), acute (HCC)    Protein-calorie malnutrition, severe (HCC) 02/19/2015   Bacteremia due to Staphylococcus 02/17/2015   Alcohol withdrawal delirium (HCC)    Altered mental status    Metabolic acidosis    Essential hypertension     Encephalopathy 02/11/2015   Alcohol intoxication 02/11/2015   Lactic acidosis 02/11/2015   Bleeding from the nose 03/19/2014   TIA (transient ischemic attack) 12/24/2013   Weakness 12/23/2013   Left-sided weakness 12/23/2013   Malignant hypertension 12/23/2013   Seizure disorder (HCC) 12/23/2013   Vomiting 06/18/2013   Chest pain 06/18/2013   Hypertensive urgency 10/30/2012   Migraine variant 10/30/2012   TOBACCO ABUSE 01/11/2008   HYPERTENSION, BENIGN 11/24/2007   CHRONIC FRONTAL SINUSITIS 11/24/2007   Convulsions (HCC) 11/24/2007   Headache(784.0) 11/24/2007   PCP:  Sim Emery CROME, MD Pharmacy:   [facility] [phone] - [place], [place] - [address] AT [facility] [address] [place] [address] Phone: [phone] Fax: [phone]     Social Drivers of Health (SDOH) Social History: SDOH Screenings   Food Insecurity: No Food Insecurity (09/23/2024)  Housing: Low Risk  (09/23/2024)  Transportation Needs: Unmet Transportation Needs (09/23/2024)  Utilities: Not At Risk (09/23/2024)  Financial Resource Strain: Medium Risk (09/17/2023)  Physical Activity: Not on File (08/30/2023)   Received from [facility]  Social Connections: Not on File (08/30/2023)   Received from [facility]  Stress: Not on File (08/30/2023)   Received from [facility]  Tobacco Use: High Risk (11/14/2024)   SDOH Interventions:     Readmission Risk Interventions    09/25/2024    9:55 AM  Readmission Risk Prevention Plan  Transportation Screening Complete  Medication Review (RN Care Manager) Complete  PCP or Specialist appointment within 3-5 days of discharge Complete  HRI or Home Care Consult Complete  Palliative Care Screening Not Applicable  Skilled Nursing Facility Not Applicable
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
## 2024-11-22 ENCOUNTER — Inpatient Hospital Stay (HOSPITAL_COMMUNITY)

## 2024-11-22 DIAGNOSIS — J9621 Acute and chronic respiratory failure with hypoxia: Secondary | ICD-10-CM | POA: Diagnosis not present

## 2024-11-22 DIAGNOSIS — Z515 Encounter for palliative care: Secondary | ICD-10-CM | POA: Diagnosis not present

## 2024-11-22 DIAGNOSIS — C3490 Malignant neoplasm of unspecified part of unspecified bronchus or lung: Secondary | ICD-10-CM

## 2024-11-22 DIAGNOSIS — J9601 Acute respiratory failure with hypoxia: Secondary | ICD-10-CM | POA: Diagnosis not present

## 2024-11-22 DIAGNOSIS — F1010 Alcohol abuse, uncomplicated: Secondary | ICD-10-CM | POA: Diagnosis not present

## 2024-11-22 DIAGNOSIS — J9602 Acute respiratory failure with hypercapnia: Secondary | ICD-10-CM | POA: Diagnosis not present

## 2024-11-22 DIAGNOSIS — R410 Disorientation, unspecified: Secondary | ICD-10-CM | POA: Diagnosis not present

## 2024-11-22 DIAGNOSIS — I5030 Unspecified diastolic (congestive) heart failure: Secondary | ICD-10-CM | POA: Diagnosis not present

## 2024-11-22 LAB — BASIC METABOLIC PANEL WITH GFR
Anion gap: 7 (ref 5–15)
BUN: 36 mg/dL — ABNORMAL HIGH (ref 6–20)
CO2: 32 mmol/L (ref 22–32)
Calcium: 8 mg/dL — ABNORMAL LOW (ref 8.9–10.3)
Chloride: 107 mmol/L (ref 98–111)
Creatinine, Ser: 0.92 mg/dL (ref 0.61–1.24)
GFR, Estimated: >60 mL/min (ref >60)
Glucose, Bld: 135 mg/dL — ABNORMAL HIGH (ref 70–99)
Potassium: 4.3 mmol/L (ref 3.5–5.1)
Sodium: 146 mmol/L — ABNORMAL HIGH (ref 135–145)

## 2024-11-22 LAB — CBC
HCT: 49.1 % (ref 39.0–52.0)
Hemoglobin: 15.2 g/dL (ref 13.0–17.0)
MCH: 33 pg (ref 26.0–34.0)
MCHC: 31 g/dL (ref 30.0–36.0)
MCV: 106.5 fL — ABNORMAL HIGH (ref 80.0–100.0)
Platelets: 184 K/uL (ref 150–400)
RBC: 4.61 MIL/uL (ref 4.22–5.81)
RDW: 15.5 % (ref 11.5–15.5)
WBC: 12.2 K/uL — ABNORMAL HIGH (ref 4.0–10.5)
nRBC: 0 % (ref 0.0–0.2)

## 2024-11-22 LAB — GLUCOSE, CAPILLARY
Glucose-Capillary: 134 mg/dL — ABNORMAL HIGH (ref 70–99)
Glucose-Capillary: 138 mg/dL — ABNORMAL HIGH (ref 70–99)
Glucose-Capillary: 149 mg/dL — ABNORMAL HIGH (ref 70–99)
Glucose-Capillary: 151 mg/dL — ABNORMAL HIGH (ref 70–99)
Glucose-Capillary: 157 mg/dL — ABNORMAL HIGH (ref 70–99)
Glucose-Capillary: 160 mg/dL — ABNORMAL HIGH (ref 70–99)

## 2024-11-22 LAB — CULTURE, RESPIRATORY W GRAM STAIN: Culture: NORMAL

## 2024-11-22 LAB — HEPARIN LEVEL (UNFRACTIONATED): Heparin Unfractionated: 0.39 [IU]/mL (ref 0.30–0.70)

## 2024-11-22 MED ORDER — NUTRISOURCE FIBER PO PACK
1.0000 | PACK | Freq: Two times a day (BID) | ORAL | Status: DC
  Administered 2024-11-22 – 2024-11-23 (×4): 1
  Filled 2024-11-22 (×3): qty 1

## 2024-11-22 MED ORDER — FREE WATER
200.0000 mL | Freq: Four times a day (QID) | Status: DC
  Administered 2024-11-22 – 2024-11-29 (×29): 200 mL

## 2024-11-22 MED ORDER — ALBUTEROL SULFATE (2.5 MG/3ML) 0.083% IN NEBU
2.5000 mg | INHALATION_SOLUTION | RESPIRATORY_TRACT | Status: DC | PRN
  Administered 2024-11-22 – 2024-12-02 (×7): 2.5 mg via RESPIRATORY_TRACT
  Filled 2024-11-22 (×7): qty 3

## 2024-11-22 MED ORDER — ALBUTEROL SULFATE (2.5 MG/3ML) 0.083% IN NEBU
INHALATION_SOLUTION | RESPIRATORY_TRACT | Status: AC
  Filled 2024-11-22: qty 3

## 2024-11-22 NOTE — Evaluation (Signed)
 Physical Therapy Evaluation Patient Details Name: Richard [name] MRN: [id number] DOB: [birth year] Today's Date: 11/22/2024  History of Present Illness  The pt is a 53 yo male presenting 12/2 after being found down at home. Hypoxic to 80s on arrival, intubated 12/3. Admitted for management of acute on chronic hypoxic/hypercarbic respiratory failure and acute encephalopathy. Imaging showing worsening 5.3 cm mass-like consolidation in the medial left lower lobe. Acute DVT L common femoral vein 12/8. PMH includes: seizures, TBI, polysubstance abuse, HTN, lung cancer, COPD, HFpEF.   Clinical Impression  Pt in bed upon arrival of PT, agreeable to evaluation at this time. Prior to admission the pt was ambulating short distances in his home with use of cane or no DME, family reports they suspect a few falls in last few months. The pt presents today with intermittent command following for extremity assessment, but consistently better strength and ROM in LUE compared to RUE and LLE compared to RLE. The pt demos intermittent visual tracking initially, but decreased with fatigue in session along with generally decreased command following with fatigue. Pt VSS with bed-level activity, dependent on total trunk and head support to maintain neutral at this time, pt with preference for R cervical rotation towards vent. At this time, pt will likely benefit from continued inpatient therapies after medically stable for d/c, <3hours/day or in whatever capacity best aligns with goals of care. Will follow acutely as it aligns with goals of care.       If plan is discharge home, recommend the following: Two people to help with walking and/or transfers;Two people to help with bathing/dressing/bathroom;Assistance with feeding;Direct supervision/assist for medications management;Direct supervision/assist for financial management;Assist for transportation;Help with stairs or ramp for entrance;Supervision due to cognitive  status;Assistance with cooking/housework   Can travel by private vehicle   No    Equipment Recommendations Hospital bed;Hoyer lift;Wheelchair (measurements PT);Wheelchair cushion (measurements PT)  Recommendations for Other Services       Functional Status Assessment Patient has had a recent decline in their functional status and demonstrates the ability to make significant improvements in function in a reasonable and predictable amount of time.     Precautions / Restrictions Precautions Precautions: Fall Recall of Precautions/Restrictions: Impaired Precaution/Restrictions Comments: intubated, flexiseal, primofit Restrictions Weight Bearing Restrictions Per Provider Order: No      Mobility  Bed Mobility Overal bed mobility: Needs Assistance             General bed mobility comments: totalA to reposition, use of bed features        Balance Overall balance assessment: History of Falls (dependent on total support right now)                                           Pertinent Vitals/Pain Pain Assessment Pain Assessment: No/denies pain    Home Living Family/patient expects to be discharged to:: Private residence Living Arrangements: Spouse/significant other Available Help at Discharge: Family;Available PRN/intermittently Type of Home: House Home Access: Stairs to enter Entrance Stairs-Rails: None Entrance Stairs-Number of Steps: 3   Home Layout: One level Home Equipment: None Additional Comments: pt used oxygen  PRN at home    Prior Function Prior Level of Function : Needs assist             Mobility Comments: pt used cane at times but mostly not using, family suspects some falls in last 6 months ADLs Comments:  pre family, independent     Extremity/Trunk Assessment   Upper Extremity Assessment Upper Extremity Assessment: Generalized weakness;RUE deficits/detail;LUE deficits/detail;Difficult to assess due to impaired cognition RUE  Deficits / Details: trace activation for grip, no active movement for finger extension, no attempt to assist with elbow flexion or shoulder flexion but pushes against min resistance elbow extension LUE Deficits / Details: mod strength finger flexion, no active finger extension, grossly 4-/5 elbow flexion and extension but only following commands 1-2x    Lower Extremity Assessment Lower Extremity Assessment: RLE deficits/detail;LLE deficits/detail;Difficult to assess due to impaired cognition RLE Deficits / Details: pt demos small ROM at toes, no active knee and hip flexion but is able to push throuh foot for knee and hip extension against min resistance. only completed x2 despite multiple commands LLE Deficits / Details: pt demos small ROM at toes, partial ROM knee and hip flexion against gravity, but is able to push throuh foot for knee and hip extension against min resistance. only completed x2 despite multiple commands    Cervical / Trunk Assessment Cervical / Trunk Assessment: Other exceptions Cervical / Trunk Exceptions: R cervical rotation towards vent  Communication   Communication Communication: Impaired Factors Affecting Communication: Trach/intubated    Cognition Arousal: Lethargic, Obtunded Behavior During Therapy: Flat affect   PT - Cognitive impairments: Difficult to assess Difficult to assess due to: Intubated, Level of arousal                     PT - Cognition Comments: pt initially following cues wiht increased time and max cues, progressed to not following commands in session due to fatigue. Following commands: Impaired Following commands impaired: Follows one step commands inconsistently, Follows one step commands with increased time     Cueing Cueing Techniques: Verbal cues, Gestural cues, Tactile cues, Visual cues     General Comments General comments (skin integrity, edema, etc.): vent 80% FiO2, PEEP 10, VSS with all bed-level mobility    Exercises  Low Level/ICU Exercises Ankle Circles/Pumps: AAROM, Both, 10 reps Quad Sets: AAROM, Both, 10 reps Hip ABduction/ADduction: PROM, Both, 5 reps Heel Slides: AAROM, Both, 10 reps Shoulder Flexion: PROM, Both, 5 reps Elbow Flexion: AAROM, Left, PROM, Right, 10 reps   Assessment/Plan    PT Assessment Patient needs continued PT services  PT Problem List Decreased strength;Decreased range of motion;Decreased activity tolerance;Decreased balance;Decreased mobility;Decreased coordination;Decreased cognition;Decreased knowledge of use of DME;Decreased safety awareness       PT Treatment Interventions DME instruction;Gait training;Stair training;Functional mobility training;Therapeutic activities;Therapeutic exercise;Balance training;Cognitive remediation;Patient/family education    PT Goals (Current goals can be found in the Care Plan section)  Acute Rehab PT Goals Patient Stated Goal: none stated PT Goal Formulation: Patient unable to participate in goal setting Time For Goal Achievement: 12/06/24 Potential to Achieve Goals: Fair    Frequency Min 1X/week        AM-PAC PT 6 Clicks Mobility  Outcome Measure Help needed turning from your back to your side while in a flat bed without using bedrails?: Total Help needed moving from lying on your back to sitting on the side of a flat bed without using bedrails?: Total Help needed moving to and from a bed to a chair (including a wheelchair)?: Total Help needed standing up from a chair using your arms (e.g., wheelchair or bedside chair)?: Total Help needed to walk in hospital room?: Total Help needed climbing 3-5 steps with a railing? : Total 6 Click Score: 6  End of Session Equipment Utilized During Treatment: Oxygen  Activity Tolerance: Patient limited by fatigue;Patient limited by lethargy Patient left: in bed;with call bell/phone within reach;with bed alarm set;with restraints reapplied Nurse Communication: Mobility status PT Visit  Diagnosis: Unsteadiness on feet (R26.81);Muscle weakness (generalized) (M62.81);History of falling (Z91.81)    Time: 8481-8461 PT Time Calculation (min) (ACUTE ONLY): 20 min   Charges:   PT Evaluation $PT Eval Moderate Complexity: 1 Mod   PT General Charges $$ ACUTE PT VISIT: 1 Visit         Izetta Call, PT, DPT   Acute Rehabilitation Department Office [phone] Secure Chat Communication Preferred  Izetta JULIANNA Call 11/22/2024, 4:31 PM

## 2024-11-22 NOTE — Progress Notes (Signed)
 Patient ID: Richard [name], male   DOB: Apr 16, 1971, 53 y.o.   MRN: [id number]    Progress Note from the Palliative Medicine Team at [facility]   Patient Name: Richard [name]        Date: 11/22/2024 DOB: 12/12/1971  Age: 53 y.o. MRN#: [id number] Attending Physician: Claudene Toribio BROCKS, MD Primary Care Physician: Sim Emery CROME, MD Admit Date: 11/14/2024   Reason for Consultation/Follow-up   Establishing Goals of Care   HPI/ Brief Hospital Review  53 y.o. male   admitted on 11/14/2024.  Neighbors found patient down, EMS called, patient brought to hospice with resistance to EMS.  He was ultimately IVC to remain in the hospital.  Dense psychosocial history   Admitted for treatment and stabilization   Admitted to ICU 12/3 12/4 Intubated, bronch with BAL, right thoracentesis obtained  12/5 Phenobarb taper restarted yesterday, has helped, currently weaning off fentanyl   12/8 failed SBT, bronch >> looks like has endobronchial involvement cancer; MRI brain/C spine no mets 12/10 increased oxygen  needs requiring 80% FiO2 and 10 PEEP   Family face treatment option decisions, advanced directive decisions and anticipatory care needs.    Subjective  Extensive chart review has been completed prior to meeting with patient/family  including labs, vital signs, imaging, progress/consult notes, orders, medications and available advance directive documents.    This NP assessed patient at the bedside as a follow up for palliative medicine needs and emotional support and to meet with patient's wife as scheduled for continued education regarding current medical situation.  I met with patient's wife and patient's sister/Richard [name] for ongoing education regarding the seriousness of patient's current medical situation and pending decisions regarding treatment options, advanced directives and anticipatory care needs.    Patient's wife had lots of questions regarding patient's cancer diagnosis.  Detailed education  offered on the complexity/difficulty of further diagnostic workup and treatment of the underlying cancer.  Discussion included how the patient's current medical condition-specifically ventilator dependence,  clinical instability and the overall complexity of this hospitalization significantly limits the feasibility and potential benefit of continued cancer directed interventions.  Education offered regarding the patient's current serious medical condition and the concerned for prolonged clinical trajectory with limited potential for meaningful recovery.  Discussion included the uncertainty of improvement given the severity of illness and the likelihood that any recovery,, if achieved,, may be prolonged and may not return the patient to the prior level of function.  Attempted to explore patient's values and goals as it relates to quality of life.  I worry about patient's wife's insight into the seriousness of her husband's current medical situation as it relates to not only his current medical situation, particularly in the context of patient's prior medical and psychosocial history before this admission.        At this time patient's wife and sister request more time for outcomes.  Ultimately they are hopeful for improvement and ability to treat the cancer.  They are open to all offered and available medical interventions to prolong life   Education offered today regarding  the importance of continued conversation with family and their  medical providers regarding overall plan of care and treatment options,  ensuring decisions are within the context of the patients values and GOCs.  Questions and concerns addressed   Discussed with primary team and nursing staff  PMT  will continue to support holistically   Time: 65  minutes  Detailed review of medical records ( labs, imaging, vital signs), medically appropriate exam (  MS, skin, cardiac,  resp)   discussed with treatment team, counseling and  education to patient, family, staff, documenting clinical information, medication management, coordination of care    [name] NP  Palliative Medicine Team Team Phone # [phone] Pager [phone]

## 2024-11-22 NOTE — Progress Notes (Signed)
 PHARMACY - ANTICOAGULATION CONSULT NOTE  Pharmacy Consult:  Heparin  Indication: Acute femoral DVT  Allergies  Allergen Reactions   Dilaudid  [Hydromorphone  Hcl] Nausea Only and Other (See Comments)    Bradycardia  Hyperthermia    Tape Itching and Dermatitis    Patient Measurements: Height: 5' 9 (175.3 cm) Weight: 84.7 kg (186 lb 11.7 oz) IBW/kg (Calculated) : 70.7 HEPARIN  DW (KG): 83  Vital Signs: Temp: 99.7 F (37.6 C) (12/10 0400) Temp Source: Axillary (12/10 0400) BP: 96/79 (12/10 0600) Pulse Rate: 91 (12/10 0600)  Labs: Recent Labs    11/20/24 0449 11/20/24 2309 11/21/24 0511 11/21/24 1439 11/22/24 0345  HGB 16.7  --  16.3  --  15.2  HCT 52.1*  --  51.6  --  49.1  PLT 205  --  187  --  184  HEPARINUNFRC  --    < > 0.26* 0.30 0.39  CREATININE 0.96  --  1.28*  --  0.92   < > = values in this interval not displayed.    Estimated Creatinine Clearance: 91.8 mL/min (by C-G formula based on SCr of 0.92 mg/dL).  Assessment: 35 YOM presented with AWS, PNA and pleural effusion.  Bronch on 12/8 concern for recurrent cancer.  Doppler positive for femoral DVT and Pharmacy consulted to dose IV heparin  as MRI is negative for mets.  Heparin  level therapeutic.  RN reports pink-tinged secretions.  CBC stable.  Goal of Therapy:  Heparin  level 0.3-0.5 units/ml Monitor platelets by anticoagulation protocol: Yes   Plan:  Continue heparin  gtt at 1200 units/hr Daily heparin  level and CBC Monitor closely for bleeding worsening/resolution  Igor Bishop D. [place], PharmD, BCPS, BCCCP 11/22/2024, 7:10 AM
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
## 2024-11-23 DIAGNOSIS — R509 Fever, unspecified: Secondary | ICD-10-CM

## 2024-11-23 DIAGNOSIS — J9621 Acute and chronic respiratory failure with hypoxia: Secondary | ICD-10-CM | POA: Diagnosis not present

## 2024-11-23 DIAGNOSIS — J910 Malignant pleural effusion: Secondary | ICD-10-CM | POA: Diagnosis not present

## 2024-11-23 DIAGNOSIS — I5032 Chronic diastolic (congestive) heart failure: Secondary | ICD-10-CM

## 2024-11-23 DIAGNOSIS — I2510 Atherosclerotic heart disease of native coronary artery without angina pectoris: Secondary | ICD-10-CM | POA: Diagnosis not present

## 2024-11-23 DIAGNOSIS — J189 Pneumonia, unspecified organism: Secondary | ICD-10-CM

## 2024-11-23 DIAGNOSIS — R042 Hemoptysis: Secondary | ICD-10-CM | POA: Diagnosis not present

## 2024-11-23 DIAGNOSIS — R531 Weakness: Secondary | ICD-10-CM | POA: Diagnosis not present

## 2024-11-23 DIAGNOSIS — R627 Adult failure to thrive: Secondary | ICD-10-CM

## 2024-11-23 DIAGNOSIS — F1020 Alcohol dependence, uncomplicated: Secondary | ICD-10-CM

## 2024-11-23 DIAGNOSIS — J449 Chronic obstructive pulmonary disease, unspecified: Secondary | ICD-10-CM | POA: Diagnosis not present

## 2024-11-23 LAB — GLUCOSE, CAPILLARY
Glucose-Capillary: 138 mg/dL — ABNORMAL HIGH (ref 70–99)
Glucose-Capillary: 127 mg/dL — ABNORMAL HIGH (ref 70–99)
Glucose-Capillary: 148 mg/dL — ABNORMAL HIGH (ref 70–99)
Glucose-Capillary: 158 mg/dL — ABNORMAL HIGH (ref 70–99)
Glucose-Capillary: 151 mg/dL — ABNORMAL HIGH (ref 70–99)
Glucose-Capillary: 133 mg/dL — ABNORMAL HIGH (ref 70–99)

## 2024-11-23 LAB — CBC
HCT: 49.7 % (ref 39.0–52.0)
Hemoglobin: 15.2 g/dL (ref 13.0–17.0)
MCH: 32.8 pg (ref 26.0–34.0)
MCHC: 30.6 g/dL (ref 30.0–36.0)
MCV: 107.3 fL — ABNORMAL HIGH (ref 80.0–100.0)
Platelets: 201 K/uL (ref 150–400)
RBC: 4.63 MIL/uL (ref 4.22–5.81)
RDW: 15.4 % (ref 11.5–15.5)
WBC: 8.9 K/uL (ref 4.0–10.5)
nRBC: 0 % (ref 0.0–0.2)

## 2024-11-23 LAB — BASIC METABOLIC PANEL WITH GFR
Anion gap: 8 (ref 5–15)
BUN: 34 mg/dL — ABNORMAL HIGH (ref 6–20)
CO2: 31 mmol/L (ref 22–32)
Calcium: 8.1 mg/dL — ABNORMAL LOW (ref 8.9–10.3)
Chloride: 107 mmol/L (ref 98–111)
Creatinine, Ser: 0.62 mg/dL (ref 0.61–1.24)
GFR, Estimated: >60 mL/min (ref >60)
Glucose, Bld: 137 mg/dL — ABNORMAL HIGH (ref 70–99)
Potassium: 4.4 mmol/L (ref 3.5–5.1)
Sodium: 146 mmol/L — ABNORMAL HIGH (ref 135–145)

## 2024-11-23 LAB — PHOSPHORUS: Phosphorus: 3.2 mg/dL (ref 2.5–4.6)

## 2024-11-23 LAB — HEPARIN LEVEL (UNFRACTIONATED)
Heparin Unfractionated: 0.21 [IU]/mL — ABNORMAL LOW (ref 0.30–0.70)
Heparin Unfractionated: 0.32 [IU]/mL (ref 0.30–0.70)

## 2024-11-23 LAB — MAGNESIUM: Magnesium: 2.8 mg/dL — ABNORMAL HIGH (ref 1.7–2.4)

## 2024-11-23 MED ORDER — TRANEXAMIC ACID FOR INHALATION
500.0000 mg | Freq: Four times a day (QID) | RESPIRATORY_TRACT | Status: AC
  Administered 2024-11-23 (×3): 500 mg via RESPIRATORY_TRACT
  Filled 2024-11-23 (×3): qty 10

## 2024-11-23 MED ORDER — SODIUM CHLORIDE 3 % IN NEBU
4.0000 mL | INHALATION_SOLUTION | Freq: Four times a day (QID) | RESPIRATORY_TRACT | Status: DC
  Administered 2024-11-23 – 2024-11-25 (×8): 4 mL via RESPIRATORY_TRACT
  Filled 2024-11-23 (×6): qty 4

## 2024-11-23 NOTE — Progress Notes (Signed)
 Nutrition Follow-up  DOCUMENTATION CODES:   Non-severe (moderate) malnutrition in context of chronic illness (cancer)  INTERVENTION:   Continue TF via Cortrak: Osmolite 1.5 at 65 ml/h (1560 ml per day) Prosource TF20 60 ml BID Free water  flushes 200 ml q6h Provides 2500 kcal, 138 gm protein, 1988 ml free water  daily. Continue MVI with minerals daily via tube.  NUTRITION DIAGNOSIS:   Moderate Malnutrition related to cancer and cancer related treatments as evidenced by mild fat depletion, moderate fat depletion, moderate muscle depletion; ongoing.  GOAL:   Patient will meet greater than or equal to 90% of their needs; met wit TF.  MONITOR:   Diet advancement, Vent status, TF tolerance, Labs  REASON FOR ASSESSMENT:   Consult Enteral/tube feeding initiation and management  ASSESSMENT:   Pt with hx of CVA, HTN. DVT, CHF, CAD, chronic respiratory failure (on 4-5L [place]), COPD, NSCLC stage 3b (s/p palliative radiation d/t noncompliance w/ chemo and immunotherapy), and polysubstance abuse. Admitted for encephalopathy and acute on chronic respiratory failure in the setting of alcohol intoxication.  Patient remains intubated on ventilator support with ongoing high oxygen  requirements. He is having bloody secretions from endobronchial tumor tissue.  MV: 11.8 L/min Temp (24hrs), Avg:99.1 F (37.3 C), Min:98.1 F (36.7 C), Max:99.8 F (37.7 C)  Receiving TF via Cortrak: Osmolite 1.5 at 65 ml/h with Prosource TF20 60 ml BID and free water  flushes 200 ml q6h. Tolerating without difficulty to meet 100% of estimated nutrition needs.  Palliative Care team is following to assist patient's family with deciding on goals of care. Plans to continue full medical care at this time. Noted patient is not a good candidate for tracheostomy.    Labs reviewed.  Na 146, free water  flushes added 12/10 Phos 3.2 WNL K 4.4 WNL CBG: [phone]  Medications reviewed and include nutrisource fiber, folic  acid, novolog  SSI q4h, MVI with minerals, protonix , phenobarbital , precedex .  I/O -3.5 L since admission UOP: 1,950 ml x 24 h FMS: 725 ml x 24 h  12/2 Admit weight 88 kg 12/11 current weight 85 kg  Diet Order:   Diet Order             Diet NPO time specified  Diet effective now                   EDUCATION NEEDS:   Not appropriate for education at this time  Skin:  Skin Assessment: Reviewed RN Assessment  Last BM:  12/11 type 6  Height:   Ht Readings from Last 1 Encounters:  11/20/24 5' 9 (1.753 m)    Weight:   Wt Readings from Last 1 Encounters:  11/23/24 85 kg    Ideal Body Weight:  72.7 kg  BMI:  Body mass index is 27.67 kg/m.  Estimated Nutritional Needs:   Kcal:  2300-2500  Protein:  120-140 gm  Fluid:  >2L   Suzen HUNT RD, LDN, CNSC Contact via secure chat. If unavailable, use group chat RD Inpatient.

## 2024-11-23 NOTE — Progress Notes (Addendum)
 PHARMACY - ANTICOAGULATION  Pharmacy Consult:  Heparin  Indication: Acute femoral DVT Brief A/P: Heparin  level subtherapeutic Increase Heparin  rate  Allergies  Allergen Reactions   Dilaudid  [Hydromorphone  Hcl] Nausea Only and Other (See Comments)    Bradycardia  Hyperthermia    Tape Itching and Dermatitis    Patient Measurements: Height: 5' 9 (175.3 cm) Weight: 85 kg (187 lb 6.3 oz) IBW/kg (Calculated) : 70.7 HEPARIN  DW (KG): 83  Vital Signs: Temp: 98.1 F (36.7 C) (12/11 1200) Temp Source: Axillary (12/11 1200) BP: 118/90 (12/11 1200) Pulse Rate: 93 (12/11 1200)  Labs: Recent Labs    11/21/24 0511 11/21/24 1439 11/22/24 0345 11/23/24 0210 11/23/24 1141  HGB 16.3  --  15.2 15.2  --   HCT 51.6  --  49.1 49.7  --   PLT 187  --  184 201  --   HEPARINUNFRC 0.26*   < > 0.39 0.21* 0.32  CREATININE 1.28*  --  0.92 0.62  --    < > = values in this interval not displayed.    Estimated Creatinine Clearance: 114.1 mL/min (by C-G formula based on SCr of 0.62 mg/dL).  Assessment: 53 y.o. male with DVT for heparin .  Heparin  level 0.32 - therapeutic on 1300 units/hr.  CBC within normal limits. Still with mild hemoptysis - TXA nebs, continuing IV Heparin  per CCM team.    Goal of Therapy:  Heparin  level 0.3-0.5 units/ml Monitor platelets by anticoagulation protocol: Yes   Plan:  Continue Heparin  1300 units/hr Daily heparin  level and CBC while on therapy.   Harlene Boga, PharmD Please refer to [facility] for [facility] numbers 11/23/2024, 2:18 PM
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
## 2024-11-23 NOTE — Progress Notes (Signed)
 PHARMACY - ANTICOAGULATION  Pharmacy Consult:  Heparin  Indication: Acute femoral DVT Brief A/P: Heparin  level subtherapeutic Increase Heparin  rate  Allergies  Allergen Reactions   Dilaudid  [Hydromorphone  Hcl] Nausea Only and Other (See Comments)    Bradycardia  Hyperthermia    Tape Itching and Dermatitis    Patient Measurements: Height: 5' 9 (175.3 cm) Weight: 84.7 kg (186 lb 11.7 oz) IBW/kg (Calculated) : 70.7 HEPARIN  DW (KG): 83  Vital Signs: Temp: 98.7 F (37.1 C) (12/11 0400) Temp Source: Axillary (12/11 0400) BP: 111/85 (12/11 0400) Pulse Rate: 91 (12/11 0400)  Labs: Recent Labs    11/21/24 0511 11/21/24 1439 11/22/24 0345 11/23/24 0210  HGB 16.3  --  15.2 15.2  HCT 51.6  --  49.1 49.7  PLT 187  --  184 201  HEPARINUNFRC 0.26* 0.30 0.39 0.21*  CREATININE 1.28*  --  0.92 0.62    Estimated Creatinine Clearance: 105.6 mL/min (by C-G formula based on SCr of 0.62 mg/dL).  Assessment: 53 y.o. male with DVT for heparin   Goal of Therapy:  Heparin  level 0.3-0.5 units/ml Monitor platelets by anticoagulation protocol: Yes   Plan:  Increase Heparin  1300 units/hr Check heparin  level in 6 hours.  Cathlyn Arrant, PharmD, BCPS 11/23/2024, 4:32 AM

## 2024-11-23 NOTE — Progress Notes (Signed)
 Patient ID: Richard [name], male   DOB: February 06, 1971, 53 y.o.   MRN: [id number]    Progress Note from the Palliative Medicine Team at [facility]   Patient Name: Richard [name]        Date: 11/23/2024 DOB: 12/08/1971  Age: 53 y.o. MRN#: [id number] Attending Physician: Richard Toribio BROCKS, MD Primary Care Physician: Richard Emery CROME, MD Admit Date: 11/14/2024   Reason for Consultation/Follow-up   Establishing Goals of Care   HPI/ Brief Hospital Review  53 y.o. male   admitted on 11/14/2024.  Neighbors found patient down, EMS called, patient brought to hospice with resistance to EMS.  He was ultimately IVC to remain in the hospital.  Dense psychosocial history   Admitted for treatment and stabilization   Admitted to ICU 12/3 12/4 Intubated, bronch with BAL, right thoracentesis obtained  12/5 Phenobarb taper restarted yesterday, has helped, currently weaning off fentanyl   12/8 failed SBT, bronch >> looks like has endobronchial involvement cancer; MRI brain/C spine no mets 12/10 increased oxygen  needs requiring 80% FiO2 and 10 PEEP   Family face treatment option decisions, advanced directive decisions and anticipatory care needs.    Subjective  Extensive chart review has been completed prior to meeting with patient/family  including labs, vital signs, progress/consult notes, orders, medications and available advance directive documents.    This NP assessed patient at the bedside as a follow up for palliative medicine needs and emotional support, no family at bedside.    Discussed with primary team and nursing staff.  At this time as discussed with family yesterday, plans to allow time for outcomes.  Family remain hopeful for improvement and are open to all offered and available medical interventions to prolong life.  Left message for wife, await callback  PMT  will continue to support holistically   No charge  Detailed review of medical records ( labs, imaging, vital signs), medically  appropriate exam ( MS, skin, cardiac,  resp)   discussed with treatment team, counseling and education to patient, family, staff, documenting clinical information, medication management, coordination of care    Richard Plants NP  Palliative Medicine Team Team Phone # [phone] Pager [phone]

## 2024-11-24 ENCOUNTER — Inpatient Hospital Stay (HOSPITAL_COMMUNITY)

## 2024-11-24 ENCOUNTER — Inpatient Hospital Stay: Attending: Internal Medicine

## 2024-11-24 ENCOUNTER — Encounter (HOSPITAL_COMMUNITY): Admission: RE | Admit: 2024-11-24

## 2024-11-24 DIAGNOSIS — J449 Chronic obstructive pulmonary disease, unspecified: Secondary | ICD-10-CM | POA: Diagnosis not present

## 2024-11-24 DIAGNOSIS — C3411 Malignant neoplasm of upper lobe, right bronchus or lung: Secondary | ICD-10-CM | POA: Diagnosis not present

## 2024-11-24 DIAGNOSIS — I82419 Acute embolism and thrombosis of unspecified femoral vein: Secondary | ICD-10-CM

## 2024-11-24 DIAGNOSIS — I5032 Chronic diastolic (congestive) heart failure: Secondary | ICD-10-CM | POA: Diagnosis not present

## 2024-11-24 DIAGNOSIS — J15211 Pneumonia due to Methicillin susceptible Staphylococcus aureus: Secondary | ICD-10-CM | POA: Diagnosis not present

## 2024-11-24 DIAGNOSIS — R634 Abnormal weight loss: Secondary | ICD-10-CM | POA: Diagnosis not present

## 2024-11-24 DIAGNOSIS — R042 Hemoptysis: Secondary | ICD-10-CM | POA: Diagnosis not present

## 2024-11-24 DIAGNOSIS — J9621 Acute and chronic respiratory failure with hypoxia: Secondary | ICD-10-CM | POA: Diagnosis not present

## 2024-11-24 DIAGNOSIS — F1020 Alcohol dependence, uncomplicated: Secondary | ICD-10-CM | POA: Diagnosis not present

## 2024-11-24 DIAGNOSIS — R5081 Fever presenting with conditions classified elsewhere: Secondary | ICD-10-CM

## 2024-11-24 DIAGNOSIS — I2510 Atherosclerotic heart disease of native coronary artery without angina pectoris: Secondary | ICD-10-CM | POA: Diagnosis not present

## 2024-11-24 DIAGNOSIS — J910 Malignant pleural effusion: Secondary | ICD-10-CM | POA: Diagnosis not present

## 2024-11-24 LAB — HEPARIN LEVEL (UNFRACTIONATED)
Heparin Unfractionated: 0.16 [IU]/mL — ABNORMAL LOW (ref 0.30–0.70)
Heparin Unfractionated: 0.18 [IU]/mL — ABNORMAL LOW (ref 0.30–0.70)
Heparin Unfractionated: 0.37 [IU]/mL (ref 0.30–0.70)

## 2024-11-24 LAB — GLUCOSE, CAPILLARY
Glucose-Capillary: 140 mg/dL — ABNORMAL HIGH (ref 70–99)
Glucose-Capillary: 159 mg/dL — ABNORMAL HIGH (ref 70–99)
Glucose-Capillary: 133 mg/dL — ABNORMAL HIGH (ref 70–99)
Glucose-Capillary: 161 mg/dL — ABNORMAL HIGH (ref 70–99)
Glucose-Capillary: 107 mg/dL — ABNORMAL HIGH (ref 70–99)
Glucose-Capillary: 155 mg/dL — ABNORMAL HIGH (ref 70–99)

## 2024-11-24 LAB — CBC
HCT: 48.7 % (ref 39.0–52.0)
Hemoglobin: 14.9 g/dL (ref 13.0–17.0)
MCH: 32.6 pg (ref 26.0–34.0)
MCHC: 30.6 g/dL (ref 30.0–36.0)
MCV: 106.6 fL — ABNORMAL HIGH (ref 80.0–100.0)
Platelets: 211 K/uL (ref 150–400)
RBC: 4.57 MIL/uL (ref 4.22–5.81)
RDW: 15.3 % (ref 11.5–15.5)
WBC: 8.7 K/uL (ref 4.0–10.5)
nRBC: 0 % (ref 0.0–0.2)

## 2024-11-24 MED ORDER — QUETIAPINE FUMARATE 100 MG PO TABS
100.0000 mg | ORAL_TABLET | Freq: Two times a day (BID) | ORAL | Status: DC
  Administered 2024-11-24 (×2): 100 mg
  Filled 2024-11-24 (×2): qty 1

## 2024-11-24 MED ORDER — MIDAZOLAM HCL (PF) 2 MG/2ML IJ SOLN
1.0000 mg | INTRAMUSCULAR | Status: DC | PRN
  Administered 2024-11-24 – 2024-11-28 (×19): 2 mg via INTRAVENOUS
  Filled 2024-11-24 (×21): qty 2

## 2024-11-24 MED ORDER — NUTRISOURCE FIBER PO PACK
1.0000 | PACK | Freq: Four times a day (QID) | ORAL | Status: DC
  Administered 2024-11-24 – 2024-11-30 (×25): 1
  Filled 2024-11-24 (×25): qty 1

## 2024-11-24 MED ORDER — METHYLPREDNISOLONE SODIUM SUCC 40 MG IJ SOLR
40.0000 mg | Freq: Every day | INTRAMUSCULAR | Status: DC
  Administered 2024-11-24 – 2024-11-27 (×4): 40 mg via INTRAVENOUS
  Filled 2024-11-24 (×4): qty 1

## 2024-11-24 NOTE — Progress Notes (Signed)
 PHARMACY - ANTICOAGULATION CONSULT NOTE  Pharmacy Consult:  Heparin  Indication: Acute femoral DVT  Allergies  Allergen Reactions   Dilaudid  [Hydromorphone  Hcl] Nausea Only and Other (See Comments)    Bradycardia  Hyperthermia    Tape Itching and Dermatitis    Patient Measurements: Height: 5' 9 (175.3 cm) Weight: 85 kg (187 lb 6.3 oz) IBW/kg (Calculated) : 70.7 HEPARIN  DW (KG): 83  Vital Signs: Temp: 100.8 F (38.2 C) (12/12 1611) Temp Source: Axillary (12/12 1611) BP: 116/91 (12/12 1500) Pulse Rate: 99 (12/12 1624)  Labs: Recent Labs    11/22/24 0345 11/23/24 0210 11/23/24 1141 11/24/24 0410 11/24/24 1608  HGB 15.2 15.2  --  14.9  --   HCT 49.1 49.7  --  48.7  --   PLT 184 201  --  211  --   HEPARINUNFRC 0.39 0.21* 0.32 0.18* 0.16*  CREATININE 0.92 0.62  --   --   --     Estimated Creatinine Clearance: 114.1 mL/min (by C-G formula based on SCr of 0.62 mg/dL).  Assessment: 22 YOM presented with AWS, PNA and pleural effusion.  Bronch on 12/8 concern for recurrent cancer.  Doppler positive for femoral DVT and Pharmacy consulted to dose IV heparin  as MRI is negative for mets.  Heparin  level sub-therapeutic at 0.18 units/mL on 1300 units/hr.  No issue with heparin  infusion per RN.  Continued bloody secretions; CBC stable.  12/12 2nd shift update: heparin  level subtherapeutic at 0.16 despite dose increase to 1400 units/hr. No issues reported by RN.   Goal of Therapy:  Heparin  level 0.3-0.5 units/ml Monitor platelets by anticoagulation protocol: Yes   Plan:  Increase heparin  gtt to 1500 units/hr Check 6 hr heparin  level Daily heparin  level and CBC Monitor closely for bleeding worsening/resolution  Rankin Sams, PharmD, BCPS, BCCCP Clinical Pharmacist
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
## 2024-11-24 NOTE — Progress Notes (Signed)
 PHARMACY - ANTICOAGULATION CONSULT NOTE  Pharmacy Consult:  Heparin  Indication: Acute femoral DVT  Allergies  Allergen Reactions   Dilaudid  [Hydromorphone  Hcl] Nausea Only and Other (See Comments)    Bradycardia  Hyperthermia    Tape Itching and Dermatitis    Patient Measurements: Height: 5' 9 (175.3 cm) Weight: 85 kg (187 lb 6.3 oz) IBW/kg (Calculated) : 70.7 HEPARIN  DW (KG): 83  Vital Signs: Temp: 99.3 F (37.4 C) (12/12 0400) Temp Source: Axillary (12/12 0400) BP: 100/80 (12/12 0600) Pulse Rate: 104 (12/12 0500)  Labs: Recent Labs    11/22/24 0345 11/23/24 0210 11/23/24 1141 11/24/24 0410  HGB 15.2 15.2  --  14.9  HCT 49.1 49.7  --  48.7  PLT 184 201  --  211  HEPARINUNFRC 0.39 0.21* 0.32 0.18*  CREATININE 0.92 0.62  --   --     Estimated Creatinine Clearance: 114.1 mL/min (by C-G formula based on SCr of 0.62 mg/dL).  Assessment: 17 YOM presented with AWS, PNA and pleural effusion.  Bronch on 12/8 concern for recurrent cancer.  Doppler positive for femoral DVT and Pharmacy consulted to dose IV heparin  as MRI is negative for mets.  Heparin  level sub-therapeutic at 0.18 units/mL on 1300 units/hr.  No issue with heparin  infusion per RN.  Continued bloody secretions; CBC stable.  Goal of Therapy:  Heparin  level 0.3-0.5 units/ml Monitor platelets by anticoagulation protocol: Yes   Plan:  Increase heparin  gtt to 1400 units/hr Check 6 hr heparin  level Daily heparin  level and CBC Monitor closely for bleeding worsening/resolution  Charmayne Odell D. Lendell, PharmD, BCPS, BCCCP 11/24/2024, 8:37 AM

## 2024-11-24 NOTE — Plan of Care (Signed)
°  Problem: Education: Goal: Knowledge of General Education information will improve Description: Including pain rating scale, medication(s)/side effects and non-pharmacologic comfort measures Outcome: Progressing   Problem: Health Behavior/Discharge Planning: Goal: Ability to manage health-related needs will improve Outcome: Progressing   Problem: Clinical Measurements: Goal: Ability to maintain clinical measurements within normal limits will improve Outcome: Progressing Goal: Will remain free from infection Outcome: Progressing Goal: Diagnostic test results will improve Outcome: Progressing Goal: Respiratory complications will improve Outcome: Progressing Goal: Cardiovascular complication will be avoided Outcome: Progressing   Problem: Activity: Goal: Risk for activity intolerance will decrease Outcome: Progressing   Problem: Nutrition: Goal: Adequate nutrition will be maintained Outcome: Progressing   Problem: Coping: Goal: Level of anxiety will decrease Outcome: Progressing   Problem: Elimination: Goal: Will not experience complications related to bowel motility Outcome: Progressing Goal: Will not experience complications related to urinary retention Outcome: Progressing   Problem: Pain Managment: Goal: General experience of comfort will improve and/or be controlled Outcome: Progressing   Problem: Safety: Goal: Ability to remain free from injury will improve Outcome: Progressing   Problem: Skin Integrity: Goal: Risk for impaired skin integrity will decrease Outcome: Progressing   Problem: Safety: Goal: Non-violent Restraint(s) Outcome: Progressing   Problem: Activity: Goal: Ability to tolerate increased activity will improve Outcome: Progressing   Problem: Respiratory: Goal: Ability to maintain a clear airway and adequate ventilation will improve Outcome: Progressing   Problem: Role Relationship: Goal: Method of communication will improve Outcome:  Progressing   Problem: Coping: Goal: Ability to adjust to condition or change in health will improve Outcome: Progressing   Problem: Fluid Volume: Goal: Ability to maintain a balanced intake and output will improve Outcome: Progressing   Problem: Health Behavior/Discharge Planning: Goal: Ability to identify and utilize available resources and services will improve Outcome: Progressing Goal: Ability to manage health-related needs will improve Outcome: Progressing   Problem: Metabolic: Goal: Ability to maintain appropriate glucose levels will improve Outcome: Progressing   Problem: Nutritional: Goal: Maintenance of adequate nutrition will improve Outcome: Progressing Goal: Progress toward achieving an optimal weight will improve Outcome: Progressing   Problem: Skin Integrity: Goal: Risk for impaired skin integrity will decrease Outcome: Progressing   Problem: Tissue Perfusion: Goal: Adequacy of tissue perfusion will improve Outcome: Progressing

## 2024-11-25 ENCOUNTER — Encounter (HOSPITAL_COMMUNITY): Payer: Self-pay | Admitting: Critical Care Medicine

## 2024-11-25 ENCOUNTER — Inpatient Hospital Stay (HOSPITAL_COMMUNITY)

## 2024-11-25 DIAGNOSIS — C3411 Malignant neoplasm of upper lobe, right bronchus or lung: Secondary | ICD-10-CM | POA: Diagnosis not present

## 2024-11-25 DIAGNOSIS — J449 Chronic obstructive pulmonary disease, unspecified: Secondary | ICD-10-CM | POA: Diagnosis not present

## 2024-11-25 DIAGNOSIS — J9621 Acute and chronic respiratory failure with hypoxia: Secondary | ICD-10-CM | POA: Diagnosis not present

## 2024-11-25 DIAGNOSIS — J15211 Pneumonia due to Methicillin susceptible Staphylococcus aureus: Secondary | ICD-10-CM | POA: Diagnosis not present

## 2024-11-25 LAB — PHOSPHORUS: Phosphorus: 3.3 mg/dL (ref 2.5–4.6)

## 2024-11-25 LAB — GLUCOSE, CAPILLARY
Glucose-Capillary: 116 mg/dL — ABNORMAL HIGH (ref 70–99)
Glucose-Capillary: 121 mg/dL — ABNORMAL HIGH (ref 70–99)
Glucose-Capillary: 135 mg/dL — ABNORMAL HIGH (ref 70–99)
Glucose-Capillary: 149 mg/dL — ABNORMAL HIGH (ref 70–99)
Glucose-Capillary: 154 mg/dL — ABNORMAL HIGH (ref 70–99)
Glucose-Capillary: 97 mg/dL (ref 70–99)

## 2024-11-25 LAB — CBC
HCT: 46.8 % (ref 39.0–52.0)
Hemoglobin: 14.5 g/dL (ref 13.0–17.0)
MCH: 32.3 pg (ref 26.0–34.0)
MCHC: 31 g/dL (ref 30.0–36.0)
MCV: 104.2 fL — ABNORMAL HIGH (ref 80.0–100.0)
Platelets: 245 K/uL (ref 150–400)
RBC: 4.49 MIL/uL (ref 4.22–5.81)
RDW: 14.9 % (ref 11.5–15.5)
WBC: 8.9 K/uL (ref 4.0–10.5)
nRBC: 0 % (ref 0.0–0.2)

## 2024-11-25 LAB — BASIC METABOLIC PANEL WITH GFR
Anion gap: 5 (ref 5–15)
BUN: 21 mg/dL — ABNORMAL HIGH (ref 6–20)
CO2: 31 mmol/L (ref 22–32)
Calcium: 8.5 mg/dL — ABNORMAL LOW (ref 8.9–10.3)
Chloride: 107 mmol/L (ref 98–111)
Creatinine, Ser: 0.66 mg/dL (ref 0.61–1.24)
GFR, Estimated: >60 mL/min (ref >60)
Glucose, Bld: 131 mg/dL — ABNORMAL HIGH (ref 70–99)
Potassium: 4.8 mmol/L (ref 3.5–5.1)
Sodium: 143 mmol/L (ref 135–145)

## 2024-11-25 LAB — HEPARIN LEVEL (UNFRACTIONATED)
Heparin Unfractionated: 0.29 [IU]/mL — ABNORMAL LOW (ref 0.30–0.70)
Heparin Unfractionated: 0.23 [IU]/mL — ABNORMAL LOW (ref 0.30–0.70)

## 2024-11-25 LAB — MAGNESIUM: Magnesium: 2.2 mg/dL (ref 1.7–2.4)

## 2024-11-25 MED ORDER — QUETIAPINE FUMARATE 50 MG PO TABS
150.0000 mg | ORAL_TABLET | Freq: Two times a day (BID) | ORAL | Status: DC
  Administered 2024-11-25 (×2): 150 mg
  Filled 2024-11-25 (×2): qty 1

## 2024-11-25 MED ORDER — CLONAZEPAM 0.1 MG/ML ORAL SUSPENSION
0.5000 mg | Freq: Two times a day (BID) | ORAL | Status: DC
  Administered 2024-11-25 – 2024-12-05 (×21): 0.5 mg
  Filled 2024-11-25 (×21): qty 5

## 2024-11-25 MED ORDER — SODIUM CHLORIDE 3 % IN NEBU
4.0000 mL | INHALATION_SOLUTION | Freq: Two times a day (BID) | RESPIRATORY_TRACT | Status: AC
  Filled 2024-11-25: qty 4

## 2024-11-25 NOTE — Progress Notes (Addendum)
 PHARMACY - ANTICOAGULATION CONSULT NOTE  Pharmacy Consult:  Heparin  Indication: Acute femoral DVT  Allergies  Allergen Reactions   Dilaudid  [Hydromorphone  Hcl] Nausea Only and Other (See Comments)    Bradycardia  Hyperthermia    Tape Itching and Dermatitis    Patient Measurements: Height: 5' 9 (175.3 cm) Weight: 86 kg (189 lb 9.5 oz) IBW/kg (Calculated) : 70.7 HEPARIN  DW (KG): 83  Vital Signs: Temp: 99.5 F (37.5 C) (12/13 1600) Temp Source: Axillary (12/13 1600) BP: 104/88 (12/13 1700) Pulse Rate: 91 (12/13 1941)  Labs: Recent Labs    11/23/24 0210 11/23/24 1141 11/24/24 0410 11/24/24 1608 11/24/24 2339 11/25/24 0220 11/25/24 1916  HGB 15.2  --  14.9  --   --  14.5  --   HCT 49.7  --  48.7  --   --  46.8  --   PLT 201  --  211  --   --  245  --   HEPARINUNFRC 0.21*   < > 0.18*   < > 0.37 0.29* 0.23*  CREATININE 0.62  --   --   --   --  0.66  --    < > = values in this interval not displayed.    Estimated Creatinine Clearance: 114.7 mL/min (by C-G formula based on SCr of 0.66 mg/dL).  Assessment: 29 YOM presented with AWS, PNA and pleural effusion.  Bronch on 12/8 concern for recurrent cancer.  Doppler positive for femoral DVT and Pharmacy consulted to dose IV heparin  as MRI is negative for mets.  Heparin  level sub-therapeutic at 0.23 units/mL on 1550 units/hr.  No issue with heparin  infusion per RN. Sputum with some old blood per RN.   Goal of Therapy:  Heparin  level 0.3-0.5 units/ml Monitor platelets by anticoagulation protocol: Yes   Plan:  Increase heparin  gtt to 1650 units/hr Check heparin  level with AM labs Daily heparin  level and CBC Monitor closely for bleeding worsening/resolution vs IVC filter  Larraine Brazier, PharmD Clinical Pharmacist 11/25/2024  8:11 PM **Pharmacist phone directory can now be found on [email or web address] ([email or web address]).  Listed under [facility].

## 2024-11-25 NOTE — Progress Notes (Signed)
 PHARMACY - ANTICOAGULATION CONSULT NOTE  Pharmacy Consult:  Heparin  Indication: Acute femoral DVT  Allergies  Allergen Reactions   Dilaudid  [Hydromorphone  Hcl] Nausea Only and Other (See Comments)    Bradycardia  Hyperthermia    Tape Itching and Dermatitis    Patient Measurements: Height: 5' 9 (175.3 cm) Weight: 85 kg (187 lb 6.3 oz) IBW/kg (Calculated) : 70.7 HEPARIN  DW (KG): 83  Vital Signs: Temp: 97.6 F (36.4 C) (12/13 0000) Temp Source: Axillary (12/13 0000) BP: 102/82 (12/13 0000) Pulse Rate: 81 (12/13 0000)  Labs: Recent Labs    11/22/24 0345 11/23/24 0210 11/23/24 1141 11/24/24 0410 11/24/24 1608 11/24/24 2339  HGB 15.2 15.2  --  14.9  --   --   HCT 49.1 49.7  --  48.7  --   --   PLT 184 201  --  211  --   --   HEPARINUNFRC 0.39 0.21*   < > 0.18* 0.16* 0.37  CREATININE 0.92 0.62  --   --   --   --    < > = values in this interval not displayed.    Estimated Creatinine Clearance: 114.1 mL/min (by C-G formula based on SCr of 0.62 mg/dL).  Assessment: 72 YOM presented with AWS, PNA and pleural effusion.  Bronch on 12/8 concern for recurrent cancer.  Doppler positive for femoral DVT and Pharmacy consulted to dose IV heparin  as MRI is negative for mets.  Heparin  level sub-therapeutic at 0.18 units/mL on 1300 units/hr.  No issue with heparin  infusion per RN.  Continued bloody secretions; CBC stable.  12/12 2nd shift update: heparin  level subtherapeutic at 0.16 despite dose increase to 1400 units/hr. No issues reported by RN.   12/13 AM update:  Heparin  level therapeutic after rate increase  Goal of Therapy:  Heparin  level 0.3-0.5 units/ml Monitor platelets by anticoagulation protocol: Yes   Plan:  Cont heparin  1500 units/hr Heparin  level with AM labs Monitor closely for bleeding worsening/resolution  Lynwood Mckusick, PharmD, BCPS Clinical Pharmacist Phone: [phone]

## 2024-11-25 NOTE — Progress Notes (Signed)
 PHARMACY - ANTICOAGULATION CONSULT NOTE  Pharmacy Consult:  Heparin  Indication: Acute femoral DVT  Allergies  Allergen Reactions   Dilaudid  [Hydromorphone  Hcl] Nausea Only and Other (See Comments)    Bradycardia  Hyperthermia    Tape Itching and Dermatitis    Patient Measurements: Height: 5' 9 (175.3 cm) Weight: 86 kg (189 lb 9.5 oz) IBW/kg (Calculated) : 70.7 HEPARIN  DW (KG): 83  Vital Signs: Temp: 98.2 F (36.8 C) (12/13 0800) Temp Source: Axillary (12/13 0800) BP: 107/92 (12/13 0700) Pulse Rate: 89 (12/13 0746)  Labs: Recent Labs    11/23/24 0210 11/23/24 1141 11/24/24 0410 11/24/24 1608 11/24/24 2339 11/25/24 0220  HGB 15.2  --  14.9  --   --  14.5  HCT 49.7  --  48.7  --   --  46.8  PLT 201  --  211  --   --  245  HEPARINUNFRC 0.21*   < > 0.18* 0.16* 0.37 0.29*  CREATININE 0.62  --   --   --   --  0.66   < > = values in this interval not displayed.    Estimated Creatinine Clearance: 114.7 mL/min (by C-G formula based on SCr of 0.66 mg/dL).  Assessment: 76 YOM presented with AWS, PNA and pleural effusion.  Bronch on 12/8 concern for recurrent cancer.  Doppler positive for femoral DVT and Pharmacy consulted to dose IV heparin  as MRI is negative for mets.  Heparin  level slightly sub-therapeutic at 0.29 units/mL on 1500 units/hr.  No issue with heparin  infusion per RN.  Hemoptysis worse per MD; CBC stable.  Goal of Therapy:  Heparin  level 0.3-0.5 units/ml Monitor platelets by anticoagulation protocol: Yes   Plan:  Increase heparin  gtt to 1550 units/hr Check 6 hr heparin  level Daily heparin  level and CBC Monitor closely for bleeding worsening/resolution vs IVC filter  Ayane Delancey D. Lendell, PharmD, BCPS, BCCCP 11/25/2024, 10:43 AM

## 2024-11-25 NOTE — Plan of Care (Signed)
°  Problem: Education: Goal: Knowledge of General Education information will improve Description: Including pain rating scale, medication(s)/side effects and non-pharmacologic comfort measures Outcome: Progressing   Problem: Health Behavior/Discharge Planning: Goal: Ability to manage health-related needs will improve Outcome: Progressing   Problem: Clinical Measurements: Goal: Ability to maintain clinical measurements within normal limits will improve Outcome: Progressing Goal: Will remain free from infection Outcome: Progressing Goal: Diagnostic test results will improve Outcome: Progressing Goal: Respiratory complications will improve Outcome: Progressing Goal: Cardiovascular complication will be avoided Outcome: Progressing   Problem: Activity: Goal: Risk for activity intolerance will decrease Outcome: Progressing   Problem: Nutrition: Goal: Adequate nutrition will be maintained Outcome: Progressing   Problem: Coping: Goal: Level of anxiety will decrease Outcome: Progressing   Problem: Elimination: Goal: Will not experience complications related to bowel motility Outcome: Progressing Goal: Will not experience complications related to urinary retention Outcome: Progressing   Problem: Pain Managment: Goal: General experience of comfort will improve and/or be controlled Outcome: Progressing   Problem: Safety: Goal: Ability to remain free from injury will improve Outcome: Progressing   Problem: Skin Integrity: Goal: Risk for impaired skin integrity will decrease Outcome: Progressing   Problem: Safety: Goal: Non-violent Restraint(s) Outcome: Progressing   Problem: Activity: Goal: Ability to tolerate increased activity will improve Outcome: Progressing   Problem: Respiratory: Goal: Ability to maintain a clear airway and adequate ventilation will improve Outcome: Progressing   Problem: Role Relationship: Goal: Method of communication will improve Outcome:  Progressing   Problem: Coping: Goal: Ability to adjust to condition or change in health will improve Outcome: Progressing   Problem: Fluid Volume: Goal: Ability to maintain a balanced intake and output will improve Outcome: Progressing   Problem: Health Behavior/Discharge Planning: Goal: Ability to identify and utilize available resources and services will improve Outcome: Progressing Goal: Ability to manage health-related needs will improve Outcome: Progressing   Problem: Metabolic: Goal: Ability to maintain appropriate glucose levels will improve Outcome: Progressing   Problem: Nutritional: Goal: Maintenance of adequate nutrition will improve Outcome: Progressing Goal: Progress toward achieving an optimal weight will improve Outcome: Progressing   Problem: Skin Integrity: Goal: Risk for impaired skin integrity will decrease Outcome: Progressing   Problem: Tissue Perfusion: Goal: Adequacy of tissue perfusion will improve Outcome: Progressing
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
## 2024-11-26 DIAGNOSIS — C3411 Malignant neoplasm of upper lobe, right bronchus or lung: Secondary | ICD-10-CM | POA: Diagnosis not present

## 2024-11-26 DIAGNOSIS — J449 Chronic obstructive pulmonary disease, unspecified: Secondary | ICD-10-CM | POA: Diagnosis not present

## 2024-11-26 DIAGNOSIS — J15211 Pneumonia due to Methicillin susceptible Staphylococcus aureus: Secondary | ICD-10-CM | POA: Diagnosis not present

## 2024-11-26 DIAGNOSIS — J9621 Acute and chronic respiratory failure with hypoxia: Secondary | ICD-10-CM | POA: Diagnosis not present

## 2024-11-26 LAB — GLUCOSE, CAPILLARY
Glucose-Capillary: 110 mg/dL — ABNORMAL HIGH (ref 70–99)
Glucose-Capillary: 100 mg/dL — ABNORMAL HIGH (ref 70–99)
Glucose-Capillary: 141 mg/dL — ABNORMAL HIGH (ref 70–99)
Glucose-Capillary: 132 mg/dL — ABNORMAL HIGH (ref 70–99)
Glucose-Capillary: 138 mg/dL — ABNORMAL HIGH (ref 70–99)
Glucose-Capillary: 142 mg/dL — ABNORMAL HIGH (ref 70–99)

## 2024-11-26 LAB — BASIC METABOLIC PANEL WITH GFR
Anion gap: 9 (ref 5–15)
BUN: 24 mg/dL — ABNORMAL HIGH (ref 6–20)
CO2: 27 mmol/L (ref 22–32)
Calcium: 8.5 mg/dL — ABNORMAL LOW (ref 8.9–10.3)
Chloride: 103 mmol/L (ref 98–111)
Creatinine, Ser: 0.63 mg/dL (ref 0.61–1.24)
GFR, Estimated: >60 mL/min (ref >60)
Glucose, Bld: 108 mg/dL — ABNORMAL HIGH (ref 70–99)
Potassium: 4.8 mmol/L (ref 3.5–5.1)
Sodium: 139 mmol/L (ref 135–145)

## 2024-11-26 LAB — CBC
HCT: 45.2 % (ref 39.0–52.0)
Hemoglobin: 14.5 g/dL (ref 13.0–17.0)
MCH: 32.8 pg (ref 26.0–34.0)
MCHC: 32.1 g/dL (ref 30.0–36.0)
MCV: 102.3 fL — ABNORMAL HIGH (ref 80.0–100.0)
Platelets: 285 K/uL (ref 150–400)
RBC: 4.42 MIL/uL (ref 4.22–5.81)
RDW: 14.6 % (ref 11.5–15.5)
WBC: 9.1 K/uL (ref 4.0–10.5)
nRBC: 0 % (ref 0.0–0.2)

## 2024-11-26 LAB — HEPARIN LEVEL (UNFRACTIONATED)
Heparin Unfractionated: 0.2 [IU]/mL — ABNORMAL LOW (ref 0.30–0.70)
Heparin Unfractionated: 0.29 [IU]/mL — ABNORMAL LOW (ref 0.30–0.70)

## 2024-11-26 MED ORDER — FUROSEMIDE 10 MG/ML IJ SOLN
40.0000 mg | Freq: Once | INTRAMUSCULAR | Status: AC
  Administered 2024-11-26: 40 mg via INTRAVENOUS
  Filled 2024-11-26: qty 4

## 2024-11-26 MED ORDER — QUETIAPINE FUMARATE 200 MG PO TABS
200.0000 mg | ORAL_TABLET | Freq: Two times a day (BID) | ORAL | Status: DC
  Administered 2024-11-26 – 2024-12-05 (×19): 200 mg
  Filled 2024-11-26 (×19): qty 1

## 2024-11-26 MED ORDER — LOPERAMIDE HCL 1 MG/7.5ML PO SUSP
4.0000 mg | Freq: Once | ORAL | Status: AC
  Administered 2024-11-26: 4 mg
  Filled 2024-11-26: qty 30

## 2024-11-26 MED ORDER — FENTANYL 2500MCG IN NS 250ML (10MCG/ML) PREMIX INFUSION
0.0000 ug/h | INTRAVENOUS | Status: DC
  Administered 2024-11-26: 09:00:00 50 ug/h via INTRAVENOUS
  Administered 2024-11-27: 08:00:00 75 ug/h via INTRAVENOUS
  Administered 2024-11-28: 02:00:00 100 ug/h via INTRAVENOUS
  Filled 2024-11-26 (×3): qty 250

## 2024-11-26 MED ORDER — LOPERAMIDE HCL 1 MG/7.5ML PO SUSP: 2.0000 mg | ORAL | Status: DC | PRN

## 2024-11-26 MED ORDER — CHLORHEXIDINE GLUCONATE CLOTH 2 % EX PADS
6.0000 | MEDICATED_PAD | Freq: Every day | CUTANEOUS | Status: DC
  Administered 2024-11-27 – 2024-12-05 (×11): 6 via TOPICAL

## 2024-11-26 NOTE — Progress Notes (Signed)
 PHARMACY - ANTICOAGULATION CONSULT NOTE  Pharmacy Consult:  Heparin  Indication: Acute femoral DVT  Allergies  Allergen Reactions   Dilaudid  [Hydromorphone  Hcl] Nausea Only and Other (See Comments)    Bradycardia  Hyperthermia    Tape Itching and Dermatitis    Patient Measurements: Height: 5' 9 (175.3 cm) Weight: 85.6 kg (188 lb 11.4 oz) IBW/kg (Calculated) : 70.7 HEPARIN  DW (KG): 83  Vital Signs: Temp: 99.6 F (37.6 C) (12/14 0400) Temp Source: Axillary (12/14 0400) BP: 101/78 (12/14 0600) Pulse Rate: 105 (12/14 0623)  Labs: Recent Labs    11/24/24 0410 11/24/24 1608 11/25/24 0220 11/25/24 1916 11/26/24 0425  HGB 14.9  --  14.5  --  14.5  HCT 48.7  --  46.8  --  45.2  PLT 211  --  245  --  285  HEPARINUNFRC 0.18*   < > 0.29* 0.23* 0.20*  CREATININE  --   --  0.66  --  0.63   < > = values in this interval not displayed.    Estimated Creatinine Clearance: 114.5 mL/min (by C-G formula based on SCr of 0.63 mg/dL).  Assessment: 10 YOM presented with AWS, PNA and pleural effusion.  Bronch on 12/8 concern for recurrent cancer.  Doppler positive for femoral DVT and Pharmacy consulted to dose IV heparin  as MRI is negative for mets.  Heparin  level sub-therapeutic at 0.2 units/mL despite rate increass.  RN kindly checked IV line, site and pump and no issues identified.  Bloody secretions improving; CBC stable.  Goal of Therapy:  Heparin  level 0.3-0.5 units/ml Monitor platelets by anticoagulation protocol: Yes   Plan:  No bolus Increase heparin  gtt to 1800 units/hr Check 6 hr heparin  level Daily heparin  level and CBC Monitor closely for bleeding worsening/resolution vs IVC filter  Sia Gabrielsen D. Lendell, PharmD, BCPS, BCCCP 11/26/2024, 10:08 AM
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
## 2024-11-26 NOTE — Progress Notes (Signed)
 PHARMACY - ANTICOAGULATION CONSULT NOTE  Pharmacy Consult:  Heparin  Indication: Acute femoral DVT  Allergies  Allergen Reactions   Dilaudid  [Hydromorphone  Hcl] Nausea Only and Other (See Comments)    Bradycardia  Hyperthermia    Tape Itching and Dermatitis    Patient Measurements: Height: 5' 9 (175.3 cm) Weight: 85.6 kg (188 lb 11.4 oz) IBW/kg (Calculated) : 70.7 HEPARIN  DW (KG): 83  Vital Signs: Temp: 99 F (37.2 C) (12/14 1600) Temp Source: Axillary (12/14 1600) BP: 115/87 (12/14 1700) Pulse Rate: 97 (12/14 1700)  Labs: Recent Labs    11/24/24 0410 11/24/24 1608 11/25/24 0220 11/25/24 1916 11/26/24 0425 11/26/24 1812  HGB 14.9  --  14.5  --  14.5  --   HCT 48.7  --  46.8  --  45.2  --   PLT 211  --  245  --  285  --   HEPARINUNFRC 0.18*   < > 0.29* 0.23* 0.20* 0.29*  CREATININE  --   --  0.66  --  0.63  --    < > = values in this interval not displayed.    Estimated Creatinine Clearance: 114.5 mL/min (by C-G formula based on SCr of 0.63 mg/dL).  Assessment: Richard [name] presented with AWS, PNA and pleural effusion.  Bronch on 12/8 concern for recurrent cancer.  Doppler positive for femoral DVT and Pharmacy consulted to dose IV heparin  as MRI is negative for mets.  Heparin  level 0.29, slightly subtherapeutic  CBC stable, no issues with infusion noted.  Goal of Therapy:  Heparin  level 0.3-0.5 units/ml Monitor platelets by anticoagulation protocol: Yes   Plan:  Increase heparin  gtt to 1900 units/hr (no bolus) Will check heparin  level next with AM labs Daily heparin  level and CBC Monitor closely for bleeding worsening/resolution vs IVC filter  Thank you for allowing pharmacy to participate in this patient's care.  Leonor GORMAN Bash, PharmD Emergency Medicine Clinical Pharmacist 11/26/2024,7:14 PM

## 2024-11-27 DIAGNOSIS — I82409 Acute embolism and thrombosis of unspecified deep veins of unspecified lower extremity: Secondary | ICD-10-CM

## 2024-11-27 DIAGNOSIS — J441 Chronic obstructive pulmonary disease with (acute) exacerbation: Secondary | ICD-10-CM

## 2024-11-27 LAB — BASIC METABOLIC PANEL WITH GFR
Anion gap: 13 (ref 5–15)
BUN: 31 mg/dL — ABNORMAL HIGH (ref 6–20)
CO2: 22 mmol/L (ref 22–32)
Calcium: 8.6 mg/dL — ABNORMAL LOW (ref 8.9–10.3)
Chloride: 102 mmol/L (ref 98–111)
Creatinine, Ser: 0.73 mg/dL (ref 0.61–1.24)
GFR, Estimated: >60 mL/min (ref >60)
Glucose, Bld: 133 mg/dL — ABNORMAL HIGH (ref 70–99)
Potassium: 4.9 mmol/L (ref 3.5–5.1)
Sodium: 137 mmol/L (ref 135–145)

## 2024-11-27 LAB — HEPARIN LEVEL (UNFRACTIONATED)
Heparin Unfractionated: 0.27 [IU]/mL — ABNORMAL LOW (ref 0.30–0.70)
Heparin Unfractionated: 0.26 [IU]/mL — ABNORMAL LOW (ref 0.30–0.70)

## 2024-11-27 LAB — GLUCOSE, CAPILLARY
Glucose-Capillary: 148 mg/dL — ABNORMAL HIGH (ref 70–99)
Glucose-Capillary: 146 mg/dL — ABNORMAL HIGH (ref 70–99)
Glucose-Capillary: 161 mg/dL — ABNORMAL HIGH (ref 70–99)
Glucose-Capillary: 159 mg/dL — ABNORMAL HIGH (ref 70–99)
Glucose-Capillary: 120 mg/dL — ABNORMAL HIGH (ref 70–99)
Glucose-Capillary: 132 mg/dL — ABNORMAL HIGH (ref 70–99)

## 2024-11-27 LAB — CBC
HCT: 46.4 % (ref 39.0–52.0)
Hemoglobin: 14.7 g/dL (ref 13.0–17.0)
MCH: 33.1 pg (ref 26.0–34.0)
MCHC: 31.7 g/dL (ref 30.0–36.0)
MCV: 104.5 fL — ABNORMAL HIGH (ref 80.0–100.0)
Platelets: 281 K/uL (ref 150–400)
RBC: 4.44 MIL/uL (ref 4.22–5.81)
RDW: 14.3 % (ref 11.5–15.5)
WBC: 10.1 K/uL (ref 4.0–10.5)
nRBC: 0 % (ref 0.0–0.2)
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
## 2024-11-27 NOTE — Progress Notes (Signed)
 PHARMACY - ANTICOAGULATION CONSULT NOTE  Pharmacy Consult:  Heparin  Indication: Acute femoral DVT  Allergies  Allergen Reactions   Dilaudid  [Hydromorphone  Hcl] Nausea Only and Other (See Comments)    Bradycardia  Hyperthermia    Tape Itching and Dermatitis    Patient Measurements: Height: 5' 9 (175.3 cm) Weight: 84.5 kg (186 lb 4.6 oz) IBW/kg (Calculated) : 70.7 HEPARIN  DW (KG): 83  Vital Signs: Temp: 98.5 F (36.9 C) (12/15 0400) Temp Source: Axillary (12/15 0400) BP: 98/78 (12/15 0600) Pulse Rate: 98 (12/15 0600)  Labs: Recent Labs    11/25/24 0220 11/25/24 1916 11/26/24 0425 11/26/24 1812 11/27/24 0447  HGB 14.5  --  14.5  --  14.7  HCT 46.8  --  45.2  --  46.4  PLT 245  --  285  --  281  HEPARINUNFRC 0.29*   < > 0.20* 0.29* 0.27*  CREATININE 0.66  --  0.63  --  0.73   < > = values in this interval not displayed.    Estimated Creatinine Clearance: 105.6 mL/min (by C-G formula based on SCr of 0.73 mg/dL).  Assessment: 49 YOM presented with AWS, PNA and pleural effusion.  Bronch on 12/8 concern for recurrent cancer.  Doppler positive for femoral DVT and Pharmacy consulted to dose IV heparin  as MRI is negative for mets.  Heparin  level 0.27, after rate increase overnight to heparin  1900u/hr. HgB 14.7 and PLTs 281.  Confirmed with nursing no issues with heparin  infusion.   Goal of Therapy:  Heparin  level 0.3-0.5 units/ml Monitor platelets by anticoagulation protocol: Yes   Plan:  Increase heparin  gtt to 2000 units/hr (no bolus) Will check heparin  level in 6 hours.  Daily heparin  level and CBC Monitor closely for bleeding worsening/resolution  Thank you for allowing pharmacy to participate in this patient's care.  Powell Blush, PharmD, BCCCP  11/27/2024,7:11 AM

## 2024-11-27 NOTE — Progress Notes (Signed)
 PHARMACY - ANTICOAGULATION CONSULT NOTE  Pharmacy Consult:  Heparin  Indication: Acute femoral DVT  Allergies  Allergen Reactions   Dilaudid  [Hydromorphone  Hcl] Nausea Only and Other (See Comments)    Bradycardia  Hyperthermia    Tape Itching and Dermatitis    Patient Measurements: Height: 5' 9 (175.3 cm) Weight: 84.5 kg (186 lb 4.6 oz) IBW/kg (Calculated) : 70.7 HEPARIN  DW (KG): 83  Vital Signs: Temp: 99.2 F (37.3 C) (12/15 1200) Temp Source: Oral (12/15 1200) BP: 122/93 (12/15 1200) Pulse Rate: 88 (12/15 1457)  Labs: Recent Labs    11/25/24 0220 11/25/24 1916 11/26/24 0425 11/26/24 1812 11/27/24 0447 11/27/24 1454  HGB 14.5  --  14.5  --  14.7  --   HCT 46.8  --  45.2  --  46.4  --   PLT 245  --  285  --  281  --   HEPARINUNFRC 0.29*   < > 0.20* 0.29* 0.27* 0.26*  CREATININE 0.66  --  0.63  --  0.73  --    < > = values in this interval not displayed.    Estimated Creatinine Clearance: 105.6 mL/min (by C-G formula based on SCr of 0.73 mg/dL).  Assessment: 84 YOM presented with AWS, PNA and pleural effusion.  Bronch on 12/8 concern for recurrent cancer.  Doppler positive for femoral DVT and Pharmacy consulted to dose IV heparin  as MRI is negative for mets.  Heparin  level 0.26, after rate increase this AM.   HgB 14.7 and PLTs 281.  Confirmed with nursing no issues with heparin  infusion.   Goal of Therapy:  Heparin  level 0.3-0.5 units/ml Monitor platelets by anticoagulation protocol: Yes   Plan:  Increase heparin  gtt to 2150 units/hr (no bolus) Will check heparin  level in 6 hours.  Daily heparin  level and CBC Monitor closely for bleeding worsening/resolution  Thank you for allowing pharmacy to participate in this patient's care.  Harlene Barlow, [employer], BCCP Clinical Pharmacist  11/27/2024 4:52 PM   [facility] pharmacy phone numbers are listed on [email or web address]

## 2024-11-28 ENCOUNTER — Inpatient Hospital Stay (HOSPITAL_COMMUNITY)

## 2024-11-28 LAB — CBC
HCT: 47.6 % (ref 39.0–52.0)
Hemoglobin: 14.9 g/dL (ref 13.0–17.0)
MCH: 32.3 pg (ref 26.0–34.0)
MCHC: 31.3 g/dL (ref 30.0–36.0)
MCV: 103 fL — ABNORMAL HIGH (ref 80.0–100.0)
Platelets: 311 K/uL (ref 150–400)
RBC: 4.62 MIL/uL (ref 4.22–5.81)
RDW: 14.3 % (ref 11.5–15.5)
WBC: 9.5 K/uL (ref 4.0–10.5)
nRBC: 0 % (ref 0.0–0.2)

## 2024-11-28 LAB — POCT I-STAT 7, (LYTES, BLD GAS, ICA,H+H)
Acid-Base Excess: 3 mmol/L — ABNORMAL HIGH (ref 0.0–2.0)
Acid-Base Excess: 3 mmol/L — ABNORMAL HIGH (ref 0.0–2.0)
Bicarbonate: 33.2 mmol/L — ABNORMAL HIGH (ref 20.0–28.0)
Bicarbonate: 30.2 mmol/L — ABNORMAL HIGH (ref 20.0–28.0)
Calcium, Ion: 1.29 mmol/L (ref 1.15–1.40)
Calcium, Ion: 1.23 mmol/L (ref 1.15–1.40)
HCT: 47 % (ref 39.0–52.0)
HCT: 47 % (ref 39.0–52.0)
Hemoglobin: 16 g/dL (ref 13.0–17.0)
Hemoglobin: 16 g/dL (ref 13.0–17.0)
O2 Saturation: 91 %
O2 Saturation: 100 %
Patient temperature: 98.8
Patient temperature: 98.4
Potassium: 4.8 mmol/L (ref 3.5–5.1)
Potassium: 4.9 mmol/L (ref 3.5–5.1)
Sodium: 137 mmol/L (ref 135–145)
Sodium: 134 mmol/L — ABNORMAL LOW (ref 135–145)
TCO2: 35 mmol/L — ABNORMAL HIGH (ref 22–32)
TCO2: 32 mmol/L (ref 22–32)
pCO2 arterial: 73.4 mmHg (ref 32–48)
pCO2 arterial: 52.4 mmHg — ABNORMAL HIGH (ref 32–48)
pH, Arterial: 7.264 — ABNORMAL LOW (ref 7.35–7.45)
pH, Arterial: 7.368 (ref 7.35–7.45)
pO2, Arterial: 74 mmHg — ABNORMAL LOW (ref 83–108)
pO2, Arterial: 186 mmHg — ABNORMAL HIGH (ref 83–108)

## 2024-11-28 LAB — GLUCOSE, CAPILLARY
Glucose-Capillary: 106 mg/dL — ABNORMAL HIGH (ref 70–99)
Glucose-Capillary: 107 mg/dL — ABNORMAL HIGH (ref 70–99)
Glucose-Capillary: 110 mg/dL — ABNORMAL HIGH (ref 70–99)
Glucose-Capillary: 112 mg/dL — ABNORMAL HIGH (ref 70–99)
Glucose-Capillary: 131 mg/dL — ABNORMAL HIGH (ref 70–99)
Glucose-Capillary: 151 mg/dL — ABNORMAL HIGH (ref 70–99)

## 2024-11-28 LAB — HEPARIN LEVEL (UNFRACTIONATED)
Heparin Unfractionated: 0.32 [IU]/mL (ref 0.30–0.70)
Heparin Unfractionated: 0.49 [IU]/mL (ref 0.30–0.70)

## 2024-11-28 LAB — BASIC METABOLIC PANEL WITH GFR
Anion gap: 6 (ref 5–15)
BUN: 29 mg/dL — ABNORMAL HIGH (ref 6–20)
CO2: 30 mmol/L (ref 22–32)
Calcium: 8.4 mg/dL — ABNORMAL LOW (ref 8.9–10.3)
Chloride: 99 mmol/L (ref 98–111)
Creatinine, Ser: 0.69 mg/dL (ref 0.61–1.24)
GFR, Estimated: >60 mL/min (ref >60)
Glucose, Bld: 106 mg/dL — ABNORMAL HIGH (ref 70–99)
Potassium: 5.3 mmol/L — ABNORMAL HIGH (ref 3.5–5.1)
Sodium: 135 mmol/L (ref 135–145)

## 2024-11-28 MED ORDER — PIPERACILLIN-TAZOBACTAM 3.375 G IVPB
3.3750 g | Freq: Three times a day (TID) | INTRAVENOUS | Status: AC
  Administered 2024-11-28 – 2024-12-05 (×21): 3.375 g via INTRAVENOUS
  Filled 2024-11-28 (×22): qty 50

## 2024-11-28 MED ORDER — HYDRALAZINE HCL 20 MG/ML IJ SOLN
INTRAMUSCULAR | Status: AC
  Administered 2024-11-28: 12:00:00 10 mg via INTRAVENOUS
  Filled 2024-11-28: qty 1

## 2024-11-28 MED ORDER — ETOMIDATE 2 MG/ML IV SOLN: 20.0000 mg | Freq: Once | INTRAVENOUS | Status: AC

## 2024-11-28 MED ORDER — FENTANYL CITRATE (PF) 50 MCG/ML IJ SOSY
PREFILLED_SYRINGE | INTRAMUSCULAR | Status: AC
  Administered 2024-11-28: 09:00:00 100 ug via INTRAVENOUS
  Filled 2024-11-28: qty 2

## 2024-11-28 MED ORDER — FENTANYL CITRATE (PF) 50 MCG/ML IJ SOSY: 50.0000 ug | PREFILLED_SYRINGE | Freq: Once | INTRAMUSCULAR | Status: DC

## 2024-11-28 MED ORDER — ROCURONIUM BROMIDE 10 MG/ML (PF) SYRINGE: 100.0000 mg | PREFILLED_SYRINGE | Freq: Once | INTRAVENOUS | Status: DC

## 2024-11-28 MED ORDER — ROCURONIUM BROMIDE 10 MG/ML (PF) SYRINGE: 80.0000 mg | PREFILLED_SYRINGE | Freq: Once | INTRAVENOUS | Status: AC

## 2024-11-28 MED ORDER — ROCURONIUM BROMIDE 10 MG/ML (PF) SYRINGE
PREFILLED_SYRINGE | INTRAVENOUS | Status: AC
  Administered 2024-11-28: 10:00:00 80 mg via INTRAVENOUS
  Filled 2024-11-28: qty 10

## 2024-11-28 MED ORDER — FENTANYL 2500MCG IN NS 250ML (10MCG/ML) PREMIX INFUSION
50.0000 ug/h | INTRAVENOUS | Status: DC
  Administered 2024-11-28: 15:00:00 300 ug/h via INTRAVENOUS
  Administered 2024-11-28: 23:00:00 250 ug/h via INTRAVENOUS
  Administered 2024-11-28: 12:00:00 300 ug/h via INTRAVENOUS
  Administered 2024-11-29: 11:00:00 250 ug/h via INTRAVENOUS
  Administered 2024-11-29: 19:00:00 300 ug/h via INTRAVENOUS
  Administered 2024-11-30: 15:00:00 250 ug/h via INTRAVENOUS
  Administered 2024-11-30: 04:00:00 300 ug/h via INTRAVENOUS
  Administered 2024-12-01 – 2024-12-02 (×5): 250 ug/h via INTRAVENOUS
  Administered 2024-12-03: 50 ug/h via INTRAVENOUS
  Administered 2024-12-03: 250 ug/h via INTRAVENOUS
  Administered 2024-12-05: 75 ug/h via INTRAVENOUS
  Filled 2024-11-28 (×13): qty 250

## 2024-11-28 MED ORDER — HYDRALAZINE HCL 20 MG/ML IJ SOLN
10.0000 mg | Freq: Four times a day (QID) | INTRAMUSCULAR | Status: DC | PRN
  Filled 2024-11-28: qty 1

## 2024-11-28 MED ORDER — PHENYLEPHRINE 80 MCG/ML (10ML) SYRINGE FOR IV PUSH (FOR BLOOD PRESSURE SUPPORT): 80.0000 ug | PREFILLED_SYRINGE | Freq: Once | INTRAVENOUS | Status: AC | PRN

## 2024-11-28 MED ORDER — ARTIFICIAL TEARS OPHTHALMIC OINT
1.0000 | TOPICAL_OINTMENT | Freq: Three times a day (TID) | OPHTHALMIC | Status: DC
  Administered 2024-11-28 – 2024-12-05 (×22): 1 via OPHTHALMIC
  Filled 2024-11-28 (×3): qty 3.5

## 2024-11-28 MED ORDER — FENTANYL BOLUS VIA INFUSION
50.0000 ug | INTRAVENOUS | Status: DC | PRN
  Administered 2024-11-29 – 2024-12-02 (×3): 50 ug via INTRAVENOUS

## 2024-11-28 MED ORDER — NOREPINEPHRINE 4 MG/250ML-% IV SOLN
0.0000 ug/min | INTRAVENOUS | Status: DC
  Administered 2024-11-29 – 2024-11-30 (×2): 2 ug/min via INTRAVENOUS
  Administered 2024-11-30: 10:00:00 4 ug/min via INTRAVENOUS
  Administered 2024-12-01: 8 ug/min via INTRAVENOUS
  Administered 2024-12-02: 22 ug/min via INTRAVENOUS
  Administered 2024-12-02: 15 ug/min via INTRAVENOUS
  Administered 2024-12-02: 20 ug/min via INTRAVENOUS
  Administered 2024-12-02 (×2): 18 ug/min via INTRAVENOUS
  Administered 2024-12-02: 15 ug/min via INTRAVENOUS
  Administered 2024-12-03 (×2): 30 ug/min via INTRAVENOUS
  Administered 2024-12-03: 10 ug/min via INTRAVENOUS
  Administered 2024-12-03: 22 ug/min via INTRAVENOUS
  Administered 2024-12-03: 30 ug/min via INTRAVENOUS
  Administered 2024-12-03: 22 ug/min via INTRAVENOUS
  Administered 2024-12-03: 32 ug/min via INTRAVENOUS
  Administered 2024-12-03: 22 ug/min via INTRAVENOUS
  Administered 2024-12-04 (×3): 10 ug/min via INTRAVENOUS
  Administered 2024-12-05: 9 ug/min via INTRAVENOUS
  Administered 2024-12-05 (×2): 10 ug/min via INTRAVENOUS
  Filled 2024-11-28 (×7): qty 250
  Filled 2024-11-28: qty 500
  Filled 2024-11-28 (×11): qty 250

## 2024-11-28 MED ORDER — METHYLPREDNISOLONE SODIUM SUCC 125 MG IJ SOLR
80.0000 mg | Freq: Every day | INTRAMUSCULAR | Status: DC
  Administered 2024-11-28 – 2024-12-01 (×4): 80 mg via INTRAVENOUS
  Filled 2024-11-28 (×4): qty 2

## 2024-11-28 MED ORDER — VANCOMYCIN HCL 1750 MG/350ML IV SOLN
1750.0000 mg | Freq: Once | INTRAVENOUS | Status: AC
  Administered 2024-11-28: 12:00:00 1750 mg via INTRAVENOUS
  Filled 2024-11-28: qty 350

## 2024-11-28 MED ORDER — PHENYLEPHRINE 80 MCG/ML (10ML) SYRINGE FOR IV PUSH (FOR BLOOD PRESSURE SUPPORT)
PREFILLED_SYRINGE | INTRAVENOUS | Status: AC
  Administered 2024-11-28: 09:00:00 80 ug via INTRAVENOUS
  Filled 2024-11-28: qty 10

## 2024-11-28 MED ORDER — CISATRACURIUM BOLUS VIA INFUSION
0.0500 mg/kg | Freq: Once | INTRAVENOUS | Status: DC
  Filled 2024-11-28: qty 5

## 2024-11-28 MED ORDER — NOREPINEPHRINE 4 MG/250ML-% IV SOLN
INTRAVENOUS | Status: AC
  Filled 2024-11-28: qty 250

## 2024-11-28 MED ORDER — VANCOMYCIN HCL 1500 MG/300ML IV SOLN
1500.0000 mg | Freq: Two times a day (BID) | INTRAVENOUS | Status: DC
  Administered 2024-11-28 – 2024-11-29 (×2): 1500 mg via INTRAVENOUS
  Filled 2024-11-28 (×2): qty 300

## 2024-11-28 MED ORDER — PROPOFOL 1000 MG/100ML IV EMUL: 0.0000 ug/kg/min | INTRAVENOUS | Status: DC

## 2024-11-28 MED ORDER — ETOMIDATE 2 MG/ML IV SOLN
INTRAVENOUS | Status: AC
  Administered 2024-11-28: 09:00:00 20 mg via INTRAVENOUS
  Filled 2024-11-28: qty 20

## 2024-11-28 MED ORDER — FENTANYL CITRATE (PF) 50 MCG/ML IJ SOSY: 100.0000 ug | PREFILLED_SYRINGE | Freq: Once | INTRAMUSCULAR | Status: AC

## 2024-11-28 MED ORDER — PROPOFOL 1000 MG/100ML IV EMUL
25.0000 ug/kg/min | INTRAVENOUS | Status: DC
  Administered 2024-11-28: 15:00:00 80 ug/kg/min via INTRAVENOUS
  Administered 2024-11-28 (×3): 70 ug/kg/min via INTRAVENOUS
  Administered 2024-11-28: 12:00:00 80 ug/kg/min via INTRAVENOUS
  Administered 2024-11-29: 12:00:00 65 ug/kg/min via INTRAVENOUS
  Administered 2024-11-29 (×2): 70 ug/kg/min via INTRAVENOUS
  Administered 2024-11-29 (×3): 65 ug/kg/min via INTRAVENOUS
  Administered 2024-11-29: 15:00:00 75.058 ug/kg/min via INTRAVENOUS
  Administered 2024-11-29: 19:00:00 65 ug/kg/min via INTRAVENOUS
  Administered 2024-11-29: 17:00:00 75 ug/kg/min via INTRAVENOUS
  Administered 2024-11-30: 21:00:00 60 ug/kg/min via INTRAVENOUS
  Administered 2024-11-30: 08:00:00 50 ug/kg/min via INTRAVENOUS
  Administered 2024-11-30: 14:00:00 60 ug/kg/min via INTRAVENOUS
  Administered 2024-11-30: 04:00:00 65 ug/kg/min via INTRAVENOUS
  Administered 2024-11-30: 18:00:00 60 ug/kg/min via INTRAVENOUS
  Administered 2024-11-30: 01:00:00 65 ug/kg/min via INTRAVENOUS
  Administered 2024-11-30 – 2024-12-02 (×17): 60 ug/kg/min via INTRAVENOUS
  Administered 2024-12-03: 25 ug/kg/min via INTRAVENOUS
  Administered 2024-12-03 (×2): 60 ug/kg/min via INTRAVENOUS
  Administered 2024-12-03 (×2): 65 ug/kg/min via INTRAVENOUS
  Administered 2024-12-03: 40 ug/kg/min via INTRAVENOUS
  Administered 2024-12-03 – 2024-12-05 (×15): 65 ug/kg/min via INTRAVENOUS
  Filled 2024-11-28: qty 200
  Filled 2024-11-28 (×3): qty 100
  Filled 2024-11-28: qty 200
  Filled 2024-11-28 (×8): qty 100
  Filled 2024-11-28 (×2): qty 200
  Filled 2024-11-28 (×8): qty 100
  Filled 2024-11-28: qty 200
  Filled 2024-11-28 (×20): qty 100
  Filled 2024-11-28 (×2): qty 200
  Filled 2024-11-28 (×3): qty 100

## 2024-11-28 MED ORDER — MIDAZOLAM HCL (PF) 2 MG/2ML IJ SOLN
4.0000 mg | Freq: Once | INTRAMUSCULAR | Status: AC
  Administered 2024-11-28: 09:00:00 4 mg via INTRAVENOUS

## 2024-11-28 MED ORDER — PROPOFOL 1000 MG/100ML IV EMUL
INTRAVENOUS | Status: AC
  Administered 2024-11-28: 09:00:00 20 ug/kg/min via INTRAVENOUS
  Filled 2024-11-28: qty 100

## 2024-11-28 MED ORDER — SODIUM CHLORIDE 0.9 % IV SOLN
0.0000 ug/kg/min | INTRAVENOUS | Status: DC
  Administered 2024-11-29: 19:00:00 5 ug/kg/min via INTRAVENOUS
  Administered 2024-11-29: 13:00:00 3 ug/kg/min via INTRAVENOUS
  Administered 2024-11-30: 21:00:00 5.5 ug/kg/min via INTRAVENOUS
  Administered 2024-11-30 (×2): 5 ug/kg/min via INTRAVENOUS
  Administered 2024-12-01 (×2): 4 ug/kg/min via INTRAVENOUS
  Administered 2024-12-01: 5 ug/kg/min via INTRAVENOUS
  Administered 2024-12-02 (×2): 4 ug/kg/min via INTRAVENOUS
  Administered 2024-12-03: 3 ug/kg/min via INTRAVENOUS
  Administered 2024-12-03: 4.5 ug/kg/min via INTRAVENOUS
  Administered 2024-12-04: 4.503 ug/kg/min via INTRAVENOUS
  Administered 2024-12-04: 4.5 ug/kg/min via INTRAVENOUS
  Administered 2024-12-05 (×2): 4 ug/kg/min via INTRAVENOUS
  Filled 2024-11-28 (×16): qty 20

## 2024-11-28 NOTE — Progress Notes (Signed)
 PT Cancellation Note  Patient Details Name: Richard [name] MRN: [id number] DOB: 1971/09/02   Cancelled Treatment:    Reason Eval/Treat Not Completed: Patient not medically ready (pt with SPO2 84% on vent, actively getting bronch, sedated. Not medically appropriate, will sign off and await new order)   Guy Toney [place] 11/28/2024, 9:09 AM Lenoard SQUIBB, PT Acute Rehabilitation Services Office: [phone]

## 2024-11-28 NOTE — Progress Notes (Signed)
 RT NOTE: RT and RN rotated PT's arms and head turned to the right with no complications. Suction catheter passes easily. Vitals are stable.

## 2024-11-28 NOTE — Procedures (Signed)
 Intubation Procedure Note  Richard [name]  [id number]  [birth year]  Date:11/28/2024  Time:10:00 AM   Provider Performing:Dmauri Rosenow    Procedure: Intubation (31500)  Indication(s) Respiratory Failure The endotracheal tube is clogged and bronchoscope was not able to be passed therefore a decision is made to extubate and put in an 8.0 ETT.  Consent Risks of the procedure as well as the alternatives and risks of each were explained to the patient and/or caregiver.  Consent for the procedure was obtained and is signed in the bedside chart   Anesthesia Etomidate , Versed , Fentanyl , and Rocuronium    Time Out Verified patient identification, verified procedure, site/side was marked, verified correct patient position, special equipment/implants available, medications/allergies/relevant history reviewed, required imaging and test results available.   Sterile Technique Usual hand hygeine, masks, and gloves were used   Procedure Description Patient positioned in bed supine.  Sedation given as noted above.  Patient was intubated with endotracheal tube using Glidescope.  View was Grade 1 full glottis .  Number of attempts was 1.  Colorimetric CO2 detector was consistent with tracheal placement.   Complications/Tolerance None; patient tolerated the procedure well. Chest X-ray is ordered to verify placement.   EBL Minimal   Specimen(s) None  Tamela Stakes, MD  Attending Physician, Critical Care Medicine [place] See Amion for pager If no response to pager, please call [phone] until 7pm After 7pm, Please call E-link [phone]

## 2024-11-28 NOTE — Progress Notes (Signed)
 PHARMACY - ANTICOAGULATION CONSULT NOTE  Pharmacy Consult:  Heparin  Indication: Acute femoral DVT  Allergies  Allergen Reactions   Dilaudid  [Hydromorphone  Hcl] Nausea Only and Other (See Comments)    Bradycardia  Hyperthermia    Tape Itching and Dermatitis    Patient Measurements: Height: 5' 9 (175.3 cm) Weight: 86.6 kg (190 lb 14.7 oz) IBW/kg (Calculated) : 70.7 HEPARIN  DW (KG): 83  Vital Signs: Temp: 100.7 F (38.2 C) (12/16 0400) Temp Source: Axillary (12/16 0400) BP: 111/81 (12/16 0600) Pulse Rate: 96 (12/16 0730)  Labs: Recent Labs    11/26/24 0425 11/26/24 1812 11/27/24 0447 11/27/24 1454 11/27/24 2359 11/28/24 0303  HGB 14.5  --  14.7  --   --  14.9  HCT 45.2  --  46.4  --   --  47.6  PLT 285  --  281  --   --  311  HEPARINUNFRC 0.20*   < > 0.27* 0.26* 0.49 0.32  CREATININE 0.63  --  0.73  --   --  0.69   < > = values in this interval not displayed.    Estimated Creatinine Clearance: 115.1 mL/min (by C-G formula based on SCr of 0.69 mg/dL).  Assessment: 1 YOM presented with AWS, PNA and pleural effusion.  Bronch on 12/8 concern for recurrent cancer.  Doppler positive for femoral DVT and Pharmacy consulted to dose IV heparin  as MRI is negative for mets.  Heparin  level 0.32, after rate increase overnight to heparin  2150u/hr. HgB 14.9 and PLTs 311.  Confirmed with nursing no issues with heparin  infusion. Noted blood during bronch this AM. Per CCM not active bleeding and just friable mucosa.   Goal of Therapy:  Heparin  level 0.3-0.5 units/ml Monitor platelets by anticoagulation protocol: Yes   Plan:  Continue heparin  gtt at 2150 units/hr (no bolus).  Will check heparin  level in AM.  Daily heparin  level and CBC Monitor closely for bleeding worsening/resolution.   Thank you for allowing pharmacy to participate in this patient's care.  Powell Blush, PharmD, BCCCP  11/28/2024,8:11 AM

## 2024-11-28 NOTE — Progress Notes (Incomplete)
 Patient ID: Richard [name], male   DOB: [birth year], 53 y.o.   MRN: [id number]    Progress Note from the Palliative Medicine Team at [facility]   Patient Name: Richard [name]        Date: 11/28/2024 DOB: 09/30/1971  Age: 53 y.o. MRN#: [id number] Attending Physician: Gatha Pence, MD Primary Care Physician: Sim Emery CROME, MD Admit Date: 11/14/2024   Reason for Consultation/Follow-up   Establishing Goals of Care   HPI/ Brief Hospital Review  53 y.o. male   admitted on 11/14/2024.  Neighbors found patient down, EMS called, patient brought to hospice with resistance to EMS.  He was ultimately IVC to remain in the hospital.  Dense psychosocial history   Admitted for treatment and stabilization   Admitted to ICU 12/3 12/4 Intubated, bronch with BAL, right thoracentesis obtained  12/5 Phenobarb taper restarted yesterday, has helped, currently weaning off fentanyl   12/8 failed SBT, bronch >> looks like has endobronchial involvement cancer; MRI brain/C spine no mets 12/10 increased oxygen  needs requiring 80% FiO2 and 10 PEEP    Family face treatment option decisions, advanced directive decisions and anticipatory care needs.    Subjective  Extensive chart review has been completed prior to meeting with patient/family  including labs, vital signs, imaging, progress/consult notes, orders, medications and available advance directive documents.    This NP assessed patient at the bedside as a follow up for palliative medicine needs and emotional support and to meet with patient's wife as scheduled for continued education regarding current medical situation.  I met with patient's wife and patient's sister/[name] for ongoing education regarding the seriousness of patient's current medical situation and pending decisions regarding treatment options, advanced directives and anticipatory care needs.    Patient's wife had lots of questions regarding patient's cancer diagnosis.  Detailed education  offered on the complexity/difficulty of further diagnostic workup and treatment of the underlying cancer.  Discussion included how the patient's current medical condition-specifically ventilator dependence,  clinical instability and the overall complexity of this hospitalization significantly limits the feasibility and potential benefit of continued cancer directed interventions.  Education offered regarding the patient's current serious medical condition and the concerned for prolonged clinical trajectory with limited potential for meaningful recovery.  Discussion included the uncertainty of improvement given the severity of illness and the likelihood that any recovery,, if achieved,, may be prolonged and may not return the patient to the prior level of function.  Attempted to explore patient's values and goals as it relates to quality of life.  I worry about patient's wife's insight into the seriousness of her husband's current medical situation as it relates to not only his current medical situation, particularly in the context of patient's prior medical and psychosocial history before this admission.        At this time patient's wife and sister request more time for outcomes.  Ultimately they are hopeful for improvement and ability to treat the cancer.  They are open to all offered and available medical interventions to prolong life   Education offered today regarding  the importance of continued conversation with family and their  medical providers regarding overall plan of care and treatment options,  ensuring decisions are within the context of the patients values and GOCs.  Questions and concerns addressed   Discussed with primary team and nursing staff  PMT  will continue to support holistically   Time: 54  minutes  Detailed review of medical records ( labs, imaging, vital signs), medically appropriate exam (  MS, skin, cardiac,  resp)   discussed with treatment team, counseling and  education to patient, family, staff, documenting clinical information, medication management, coordination of care    [name] NP  Palliative Medicine Team Team Phone # [phone] Pager [phone]

## 2024-11-28 NOTE — Progress Notes (Signed)
 PHARMACY - ANTICOAGULATION  Pharmacy Consult:  Heparin  Indication: Acute femoral DVT Brief A/P: Heparin  level within goal range Continue Heparin  at current rate    Allergies  Allergen Reactions   Dilaudid  [Hydromorphone  Hcl] Nausea Only and Other (See Comments)    Bradycardia  Hyperthermia    Tape Itching and Dermatitis    Patient Measurements: Height: 5' 9 (175.3 cm) Weight: 84.5 kg (186 lb 4.6 oz) IBW/kg (Calculated) : 70.7 HEPARIN  DW (KG): 83  Vital Signs: Temp: 98.3 F (36.8 C) (12/16 0000) Temp Source: Axillary (12/16 0000) BP: 111/82 (12/16 0100) Pulse Rate: 90 (12/16 0100)  Labs: Recent Labs    11/25/24 0220 11/25/24 1916 11/26/24 0425 11/26/24 1812 11/27/24 0447 11/27/24 1454 11/27/24 2359  HGB 14.5  --  14.5  --  14.7  --   --   HCT 46.8  --  45.2  --  46.4  --   --   PLT 245  --  285  --  281  --   --   HEPARINUNFRC 0.29*   < > 0.20*   < > 0.27* 0.26* 0.49  CREATININE 0.66  --  0.63  --  0.73  --   --    < > = values in this interval not displayed.    Estimated Creatinine Clearance: 105.6 mL/min (by C-G formula based on SCr of 0.73 mg/dL).  Assessment: 53 y.o. male with DVT for heparin   Goal of Therapy:  Heparin  level 0.3-0.5 units/ml Monitor platelets by anticoagulation protocol: Yes   Plan:  No change to heparin    Cathlyn Arrant, PharmD, BCPS 11/28/2024, 1:39 AM

## 2024-11-28 NOTE — Progress Notes (Signed)
 Pharmacy Antibiotic Note  Richard [name] is a 53 y.o. male admitted on 11/14/2024 with pneumonia.  Pharmacy has been consulted for vancomycin  and zosyn  dosing.  Plan: Zosyn  3.375g IV q8h (4 hour infusion). Give IV Vancomycin  1750mg  x 1 for loading dose, followed by IV Vancomycin  1500 every 12 hours for eAUC522. Scr rounded to 0.8, Vd: 0.72.  Follow culture data for de-escalation.  Monitor renal function for dose adjustments as indicated.  F/u MRSA PCR.     Height: 5' 9 (175.3 cm) Weight: 86.6 kg (190 lb 14.7 oz) IBW/kg (Calculated) : 70.7  Temp (24hrs), Avg:98.9 F (37.2 C), Min:98.2 F (36.8 C), Max:100.7 F (38.2 C)  Recent Labs  Lab 11/23/24 0210 11/24/24 0410 11/25/24 0220 11/26/24 0425 11/27/24 0447 11/28/24 0303  WBC 8.9 8.7 8.9 9.1 10.1 9.5  CREATININE 0.62  --  0.66 0.63 0.73 0.69    Estimated Creatinine Clearance: 115.1 mL/min (by C-G formula based on SCr of 0.69 mg/dL).    Allergies[1]   Microbiology results: 12/16 Sputum:  12/3 BAL: MSSA, Corynebacterium  12/16 MRSA PCR:   Thank you for allowing pharmacy to be a part of this patients care.  Powell Blush, PharmD, BCCCP  11/28/2024 9:56 AM     [1]  Allergies Allergen Reactions   Dilaudid  [Hydromorphone  Hcl] Nausea Only and Other (See Comments)    Bradycardia  Hyperthermia    Tape Itching and Dermatitis

## 2024-11-28 NOTE — Procedures (Signed)
 Arterial Catheter Insertion Procedure Note  Richard [name]  [id number]  1971/02/13  Date:11/28/2024  Time:10:28 AM    Provider Performing: Corean CHRISTELLA Alonie Gazzola   Procedure: Insertion of Arterial Line (63379) with US  guidance (23062)   Indication(s): Blood pressure monitoring and/or need for frequent ABGs  Consent: Risks of the procedure as well as the alternatives and risks of each were explained to the patient and/or caregiver.  Consent for the procedure was obtained verbally from patient's spouse (at bedside).  Anesthesia: None  Time Out: Verified patient identification, verified procedure, site/side was marked, verified correct patient position, special equipment/implants available, medications/allergies/relevant history reviewed, required imaging and test results available.  Sterile Technique: Maximal sterile technique including full sterile barrier drape, hand hygiene, sterile gown, sterile gloves, mask, hair covering, sterile ultrasound probe cover (if used).  Procedure Description: Area of catheter insertion was cleaned with chlorhexidine  and draped in sterile fashion. With real-time ultrasound guidance an arterial catheter was placed into the right radial artery.  Appropriate arterial tracings confirmed on monitor.    Complications/Tolerance: None; patient tolerated the procedure well.  EBL: Minimal  Specimen(s): None  Corean CHRISTELLA Valli Randol, [name]  Pulmonary & Critical Care 11/28/2024 10:29 AM  Please see [email or web address] for pager details.  From 7A-7P if no response, please call [phone] After hours, please call ELink [phone]

## 2024-11-28 NOTE — Progress Notes (Signed)
 RT NOTE: RT X 2 to room for proning. ETT holder removed and mepilex placed on cheeks and above mouth. ETT then secured with cloth tape. RN X 4 and RT X 2 placed PT in prone position with no complications. PT's face turned to his right side with left cheek down. Vitals are stable. ABG to be drawn in one hour. RT will continue to monitor.

## 2024-11-28 NOTE — TOC Progression Note (Addendum)
 Transition of Care [facility]) - Progression Note    Patient Details  Name: Richard [name] MRN: [id number] Date of Birth: [birth year]  Transition of [facility]) CM/SW Contact  Anjolie Majer M, RN Phone Number: 11/28/2024, 4:13 PM  Clinical Narrative:  Provided clinical update to [name] with [facility] (oncology navigator), per her request.  She is appreciative of update.       Barriers to Discharge: Continued Medical Work up               Expected Discharge Plan and Services   Discharge Planning Services: CM Consult                                           Social Drivers of Health (SDOH) Interventions SDOH Screenings   Food Insecurity: No Food Insecurity (09/23/2024)  Housing: Low Risk (09/23/2024)  Transportation Needs: Unmet Transportation Needs (09/23/2024)  Utilities: Not At Risk (09/23/2024)  Financial Resource Strain: Medium Risk (09/17/2023)  Physical Activity: Not on File (08/30/2023)   Received from [facility]  Social Connections: Not on File (08/30/2023)   Received from [facility]  Stress: Not on File (08/30/2023)   Received from [facility]  Tobacco Use: High Risk (11/14/2024)   Transportation Resource Guide added to AVS  Readmission Risk Interventions    09/25/2024    9:55 AM  Readmission Risk Prevention Plan  Transportation Screening Complete  Medication Review Oceanographer) Complete  PCP or Specialist appointment within 3-5 days of discharge Complete  HRI or Home Care Consult Complete  Palliative Care Screening Not Applicable  Skilled Nursing Facility Not Applicable   Mliss MICAEL Fass, RN, BSN  Trauma/Neuro ICU Case Manager [phone]

## 2024-11-28 NOTE — Procedures (Signed)
 Central Venous Catheter Insertion Procedure Note  Richard [name]  [id number]  [birth year]  Date:11/28/2024  Time:11:06 AM   Provider Performing:Saaya Procell   Procedure: Insertion of Non-tunneled Central Venous Catheter(36556) with US  guidance (23062)   Indication(s) Medication administration  Consent Risks of the procedure as well as the alternatives and risks of each were explained to the patient and/or caregiver.  Consent for the procedure was obtained and is signed in the bedside chart  Anesthesia Topical only with 1% lidocaine    Timeout Verified patient identification, verified procedure, site/side was marked, verified correct patient position, special equipment/implants available, medications/allergies/relevant history reviewed, required imaging and test results available.  Sterile Technique Maximal sterile technique including full sterile barrier drape, hand hygiene, sterile gown, sterile gloves, mask, hair covering, sterile ultrasound probe cover (if used).  Procedure Description Area of catheter insertion was cleaned with chlorhexidine  and draped in sterile fashion.  With real-time ultrasound guidance a central venous catheter was placed into the left internal jugular vein. Nonpulsatile blood flow and easy flushing noted in all ports.  The catheter was sutured in place and sterile dressing applied.  Complications/Tolerance None; patient tolerated the procedure well. Chest X-ray is ordered to verify placement for internal jugular or subclavian cannulation.   Chest x-ray is not ordered for femoral cannulation.  EBL Minimal  Specimen(s) None   POCUS IMAGE OF RIGHT internal jugular GUIDEWIRE   Tamela Stakes, MD  Attending Physician, Critical Care Medicine [place] [facility] See Amion for pager If no response to pager, please call [phone] until 7pm After 7pm, Please call E-link [phone]

## 2024-11-28 NOTE — Progress Notes (Signed)
 RT NOTE: RT and RN rotated PT's arms and head turned to the left with no complications. Suction catheter passes easily. Vitals are stable.
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
## 2024-11-29 ENCOUNTER — Inpatient Hospital Stay (HOSPITAL_COMMUNITY)

## 2024-11-29 DIAGNOSIS — J80 Acute respiratory distress syndrome: Secondary | ICD-10-CM

## 2024-11-29 LAB — BASIC METABOLIC PANEL WITH GFR
Anion gap: 11 (ref 5–15)
Anion gap: 10 (ref 5–15)
BUN: 44 mg/dL — ABNORMAL HIGH (ref 6–20)
BUN: 47 mg/dL — ABNORMAL HIGH (ref 6–20)
CO2: 26 mmol/L (ref 22–32)
CO2: 26 mmol/L (ref 22–32)
Calcium: 9.3 mg/dL (ref 8.9–10.3)
Calcium: 9.1 mg/dL (ref 8.9–10.3)
Chloride: 96 mmol/L — ABNORMAL LOW (ref 98–111)
Chloride: 97 mmol/L — ABNORMAL LOW (ref 98–111)
Creatinine, Ser: 1.26 mg/dL — ABNORMAL HIGH (ref 0.61–1.24)
Creatinine, Ser: 1.09 mg/dL (ref 0.61–1.24)
GFR, Estimated: >60 mL/min (ref >60)
GFR, Estimated: >60 mL/min (ref >60)
Glucose, Bld: 111 mg/dL — ABNORMAL HIGH (ref 70–99)
Glucose, Bld: 152 mg/dL — ABNORMAL HIGH (ref 70–99)
Potassium: 5.5 mmol/L — ABNORMAL HIGH (ref 3.5–5.1)
Potassium: 5.2 mmol/L — ABNORMAL HIGH (ref 3.5–5.1)
Sodium: 133 mmol/L — ABNORMAL LOW (ref 135–145)
Sodium: 133 mmol/L — ABNORMAL LOW (ref 135–145)

## 2024-11-29 LAB — POCT I-STAT 7, (LYTES, BLD GAS, ICA,H+H)
Acid-Base Excess: 1 mmol/L (ref 0.0–2.0)
Acid-Base Excess: 1 mmol/L (ref 0.0–2.0)
Acid-Base Excess: 0 mmol/L (ref 0.0–2.0)
Acid-Base Excess: 2 mmol/L (ref 0.0–2.0)
Bicarbonate: 28.3 mmol/L — ABNORMAL HIGH (ref 20.0–28.0)
Bicarbonate: 29.6 mmol/L — ABNORMAL HIGH (ref 20.0–28.0)
Bicarbonate: 26.9 mmol/L (ref 20.0–28.0)
Bicarbonate: 28.6 mmol/L — ABNORMAL HIGH (ref 20.0–28.0)
Calcium, Ion: 1.17 mmol/L (ref 1.15–1.40)
Calcium, Ion: 1.2 mmol/L (ref 1.15–1.40)
Calcium, Ion: 1.19 mmol/L (ref 1.15–1.40)
Calcium, Ion: 1.19 mmol/L (ref 1.15–1.40)
HCT: 46 % (ref 39.0–52.0)
HCT: 45 % (ref 39.0–52.0)
HCT: 42 % (ref 39.0–52.0)
HCT: 43 % (ref 39.0–52.0)
Hemoglobin: 15.6 g/dL (ref 13.0–17.0)
Hemoglobin: 15.3 g/dL (ref 13.0–17.0)
Hemoglobin: 14.3 g/dL (ref 13.0–17.0)
Hemoglobin: 14.6 g/dL (ref 13.0–17.0)
O2 Saturation: 98 %
O2 Saturation: 95 %
O2 Saturation: 99 %
O2 Saturation: 97 %
Patient temperature: 99.1
Potassium: 5.3 mmol/L — ABNORMAL HIGH (ref 3.5–5.1)
Potassium: 5.1 mmol/L (ref 3.5–5.1)
Potassium: 5.1 mmol/L (ref 3.5–5.1)
Potassium: 5.2 mmol/L — ABNORMAL HIGH (ref 3.5–5.1)
Sodium: 133 mmol/L — ABNORMAL LOW (ref 135–145)
Sodium: 132 mmol/L — ABNORMAL LOW (ref 135–145)
Sodium: 133 mmol/L — ABNORMAL LOW (ref 135–145)
Sodium: 133 mmol/L — ABNORMAL LOW (ref 135–145)
TCO2: 30 mmol/L (ref 22–32)
TCO2: 31 mmol/L (ref 22–32)
TCO2: 28 mmol/L (ref 22–32)
TCO2: 30 mmol/L (ref 22–32)
pCO2 arterial: 54.9 mmHg — ABNORMAL HIGH (ref 32–48)
pCO2 arterial: 61.3 mmHg — ABNORMAL HIGH (ref 32–48)
pCO2 arterial: 49 mmHg — ABNORMAL HIGH (ref 32–48)
pCO2 arterial: 49.2 mmHg — ABNORMAL HIGH (ref 32–48)
pH, Arterial: 7.321 — ABNORMAL LOW (ref 7.35–7.45)
pH, Arterial: 7.292 — ABNORMAL LOW (ref 7.35–7.45)
pH, Arterial: 7.347 — ABNORMAL LOW (ref 7.35–7.45)
pH, Arterial: 7.372 (ref 7.35–7.45)
pO2, Arterial: 112 mmHg — ABNORMAL HIGH (ref 83–108)
pO2, Arterial: 86 mmHg (ref 83–108)
pO2, Arterial: 129 mmHg — ABNORMAL HIGH (ref 83–108)
pO2, Arterial: 91 mmHg (ref 83–108)

## 2024-11-29 LAB — GLUCOSE, CAPILLARY
Glucose-Capillary: 122 mg/dL — ABNORMAL HIGH (ref 70–99)
Glucose-Capillary: 144 mg/dL — ABNORMAL HIGH (ref 70–99)
Glucose-Capillary: 151 mg/dL — ABNORMAL HIGH (ref 70–99)
Glucose-Capillary: 159 mg/dL — ABNORMAL HIGH (ref 70–99)
Glucose-Capillary: 117 mg/dL — ABNORMAL HIGH (ref 70–99)
Glucose-Capillary: 148 mg/dL — ABNORMAL HIGH (ref 70–99)
Glucose-Capillary: 121 mg/dL — ABNORMAL HIGH (ref 70–99)
Glucose-Capillary: 110 mg/dL — ABNORMAL HIGH (ref 70–99)

## 2024-11-29 LAB — CBC
HCT: 45.6 % (ref 39.0–52.0)
Hemoglobin: 14.6 g/dL (ref 13.0–17.0)
MCH: 32.7 pg (ref 26.0–34.0)
MCHC: 32 g/dL (ref 30.0–36.0)
MCV: 102 fL — ABNORMAL HIGH (ref 80.0–100.0)
Platelets: 420 K/uL — ABNORMAL HIGH (ref 150–400)
RBC: 4.47 MIL/uL (ref 4.22–5.81)
RDW: 14.5 % (ref 11.5–15.5)
WBC: 15.2 K/uL — ABNORMAL HIGH (ref 4.0–10.5)
nRBC: 0 % (ref 0.0–0.2)

## 2024-11-29 LAB — HEPARIN LEVEL (UNFRACTIONATED)
Heparin Unfractionated: 1.02 [IU]/mL — ABNORMAL HIGH (ref 0.30–0.70)
Heparin Unfractionated: 0.8 [IU]/mL — ABNORMAL HIGH (ref 0.30–0.70)

## 2024-11-29 LAB — MRSA NEXT GEN BY PCR, NASAL: MRSA by PCR Next Gen: NOT DETECTED

## 2024-11-29 LAB — TRIGLYCERIDES: Triglycerides: 86 mg/dL (ref <150)

## 2024-11-29 LAB — LACTIC ACID, PLASMA: Lactic Acid, Venous: 1.6 mmol/L (ref 0.5–1.9)

## 2024-11-29 MED ORDER — HEPARIN (PORCINE) 25000 UT/250ML-% IV SOLN
1800.0000 [IU]/h | INTRAVENOUS | Status: DC
  Administered 2024-11-29: 1800 [IU]/h via INTRAVENOUS
  Administered 2024-11-29: 10:00:00 2000 [IU]/h via INTRAVENOUS
  Filled 2024-11-29 (×3): qty 250

## 2024-11-29 MED ORDER — FREE WATER
200.0000 mL | Freq: Three times a day (TID) | Status: DC
  Administered 2024-11-29 – 2024-12-01 (×6): 200 mL

## 2024-11-29 MED ORDER — LACTATED RINGERS IV SOLN: INTRAVENOUS | Status: AC

## 2024-11-29 MED ORDER — VANCOMYCIN HCL IN DEXTROSE 1-5 GM/200ML-% IV SOLN: 1000.0000 mg | Freq: Two times a day (BID) | INTRAVENOUS | Status: DC

## 2024-11-29 NOTE — Progress Notes (Signed)
 PHARMACY - ANTICOAGULATION CONSULT NOTE  Pharmacy Consult:  Heparin  Indication: Acute femoral DVT  Allergies  Allergen Reactions   Dilaudid  [Hydromorphone  Hcl] Nausea Only and Other (See Comments)    Bradycardia  Hyperthermia    Tape Itching and Dermatitis    Patient Measurements: Height: 5' 9 (175.3 cm) Weight: 89 kg (196 lb 3.4 oz) IBW/kg (Calculated) : 70.7 HEPARIN  DW (KG): 83  Vital Signs: Temp: 99.1 F (37.3 C) (12/17 0400) Temp Source: Axillary (12/17 0400) BP: 93/75 (12/17 0700) Pulse Rate: 110 (12/17 0700)  Labs: Recent Labs    11/27/24 0447 11/27/24 1454 11/27/24 2359 11/28/24 0303 11/28/24 1015 11/28/24 1227 11/29/24 0441  HGB 14.7  --   --  14.9 16.0 16.0 14.6  HCT 46.4  --   --  47.6 47.0 47.0 45.6  PLT 281  --   --  311  --   --  420*  HEPARINUNFRC 0.27*   < > 0.49 0.32  --   --  1.02*  CREATININE 0.73  --   --  0.69  --   --  1.26*   < > = values in this interval not displayed.    Estimated Creatinine Clearance: 73.9 mL/min (A) (by C-G formula based on SCr of 1.26 mg/dL (H)).  Assessment: 58 YOM presented with AWS, PNA and pleural effusion.  Bronch on 12/8 concern for recurrent cancer.  Doppler positive for femoral DVT and Pharmacy consulted to dose IV heparin  as MRI is negative for mets.  Heparin  level supra-therapeutic at 1.02, HgB 14.6 and PLTs 420. No issues with infusion per RN and drawn via A-line.   Goal of Therapy:  Heparin  level 0.3-0.5 units/ml Monitor platelets by anticoagulation protocol: Yes   Plan:  Hold heparin  for 1 hour.  Reduce heparin  to 2000u/hr.  Will check 6 hour HL.  Daily heparin  level and CBC Monitor closely for bleeding worsening/resolution.   Thank you for allowing pharmacy to participate in this patient's care.  Powell Blush, PharmD, BCCCP  11/29/2024,7:28 AM

## 2024-11-29 NOTE — Progress Notes (Signed)
 RT NOTE: RT X 2 to room for proning. ETT holder removed and mepilex placed on cheeks and above mouth. ETT then secured with cloth tape. RN X 4 and RT X 2 placed PT in prone position with no complications. PT's face turned to his right side with left cheek down. Vitals are stable. ABG to be drawn in one hour. RT will continue to monitor.

## 2024-11-29 NOTE — Progress Notes (Addendum)
°  RT and RN rotated PT's arms and head turned to the right with no complications. Suction catheter passes easily. Vitals are stable.

## 2024-11-29 NOTE — Progress Notes (Signed)
 RT and RN rotated PT's arms and head turned to the left with no complications. Suction catheter passes easily. Vitals are stable.

## 2024-11-29 NOTE — Progress Notes (Signed)
 RT and RN rotated PT's arms and head turned to the right with no complications. Suction catheter passes easily. Vitals are stable.

## 2024-11-29 NOTE — Progress Notes (Signed)
 eLink Physician-Brief Progress Note Patient Name: Richard [name] DOB: [birth year] MRN: [id number]   Date of Service  11/29/2024  HPI/Events of Note  Patient with urinary retention with > 900 ml of urine on bladder scanning. Patient is on the proning protocol for acute hypoxemic respiratory failure.  eICU Interventions  Foley ordered.        Richard [name] 11/29/2024, 4:02 AM

## 2024-11-29 NOTE — Progress Notes (Signed)
 PHARMACY - ANTICOAGULATION CONSULT NOTE  Pharmacy Consult:  Heparin  Indication: Acute femoral DVT  Allergies  Allergen Reactions   Dilaudid  [Hydromorphone  Hcl] Nausea Only and Other (See Comments)    Bradycardia  Hyperthermia    Tape Itching and Dermatitis    Patient Measurements: Height: 5' 9 (175.3 cm) Weight: 89 kg (196 lb 3.4 oz) IBW/kg (Calculated) : 70.7 HEPARIN  DW (KG): 83  Vital Signs: Temp: 98.4 F (36.9 C) (12/17 1200) Temp Source: Axillary (12/17 1200) BP: 105/70 (12/17 1600) Pulse Rate: 109 (12/17 1615)  Labs: Recent Labs    11/27/24 0447 11/27/24 1454 11/28/24 0303 11/28/24 1015 11/29/24 0441 11/29/24 0458 11/29/24 0825 11/29/24 1205 11/29/24 1207 11/29/24 1324 11/29/24 1506  HGB 14.7  --  14.9   < > 14.6   < > 15.3  --  14.3 14.6  --   HCT 46.4  --  47.6   < > 45.6   < > 45.0  --  42.0 43.0  --   PLT 281  --  311  --  420*  --   --   --   --   --   --   HEPARINUNFRC 0.27*   < > 0.32  --  1.02*  --   --   --   --   --  0.80*  CREATININE 0.73  --  0.69  --  1.26*  --   --  1.09  --   --   --    < > = values in this interval not displayed.    Estimated Creatinine Clearance: 85.5 mL/min (by C-G formula based on SCr of 1.09 mg/dL).  Assessment: 59 YOM presented with AWS, PNA and pleural effusion.  Bronch on 12/8 concern for recurrent cancer.  Doppler positive for femoral DVT and Pharmacy consulted to dose IV heparin  as MRI is negative for mets.  Heparin  level supra-therapeutic at 1.02, HgB 14.6 and PLTs 420. No issues with infusion per RN and drawn via A-line.   PM: heparin  level 0.8 remains supratherapeutic on 2000 units/hr. No issues with the infusion or bleeding reported.  Goal of Therapy:  Heparin  level 0.3-0.5 units/ml Monitor platelets by anticoagulation protocol: Yes   Plan:  Reduce heparin  to 1800 units/hr.  Will check 8 hour HL.  Daily heparin  level and CBC Monitor closely for bleeding worsening/resolution.   Thank you for allowing  pharmacy to participate in this patient's care.  Rocky Slade, PharmD, BCPS 11/29/2024,4:28 PM

## 2024-11-29 NOTE — Progress Notes (Signed)
 Pharmacy Antibiotic Note  Richard [name] is a 53 y.o. male admitted on 11/14/2024 with pneumonia.  Pharmacy has been consulted for vancomycin  and zosyn  dosing.  Noted bump in Scr overnight. eAUC of IV Vancomycin  1500mg  q12h is 680.   Plan: Zosyn  3.375g IV q8h (4 hour infusion). Reduce IV Vancomycin  1000 every 12 hours for eAUC454. Scr 1.09, Vd: 0.72.  Follow culture data for de-escalation.  Monitor renal function for dose adjustments as indicated.  F/u MRSA PCR.  Asked nursing to please send today.   Height: 5' 9 (175.3 cm) Weight: 89 kg (196 lb 3.4 oz) IBW/kg (Calculated) : 70.7  Temp (24hrs), Avg:98.8 F (37.1 C), Min:97.9 F (36.6 C), Max:99.2 F (37.3 C)  Recent Labs  Lab 11/25/24 0220 11/26/24 0425 11/27/24 0447 11/28/24 0303 11/29/24 0441 11/29/24 1205  WBC 8.9 9.1 10.1 9.5 15.2*  --   CREATININE 0.66 0.63 0.73 0.69 1.26* 1.09  LATICACIDVEN  --   --   --   --   --  1.6    Estimated Creatinine Clearance: 85.5 mL/min (by C-G formula based on SCr of 1.09 mg/dL).    Allergies[1]   Microbiology results: 12/16 Sputum:  12/3 BAL: MSSA, Corynebacterium  12/16 MRSA PCR:   Thank you for allowing pharmacy to be a part of this patients care.  Powell Blush, PharmD, BCCCP  11/29/2024 1:27 PM      [1]  Allergies Allergen Reactions   Dilaudid  [Hydromorphone  Hcl] Nausea Only and Other (See Comments)    Bradycardia  Hyperthermia    Tape Itching and Dermatitis
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
## 2024-11-29 NOTE — Plan of Care (Signed)
 Patient is ABG he is 7.3, pCO2 49, PaO2 149 on 100% FiO2 and 10 of PEEP. His PF ratio is 129 given this we will again prone him for 16 hours today.  Tamela Stakes, MD  Attending Physician, Critical Care Medicine [place] [facility] See Amion for pager If no response to pager, please call [phone] until 7pm After 7pm, Please call E-link [phone]

## 2024-11-29 NOTE — Plan of Care (Signed)
 Patient ID: Richard [name], male   DOB: [birth year], 53 y.o.   MRN: [id number]    Progress Note from the Palliative Medicine Team at [facility]   Patient Name: Richard [name]        Date: 11/29/2024 DOB: 12-12-1971  Age: 53 y.o. MRN#: [id number] Attending Physician: Gatha Pence, MD Primary Care Physician: Sim Emery CROME, MD Admit Date: 11/14/2024   Reason for Consultation/Follow-up   Establishing Goals of Care   HPI/ Brief Hospital Review  53 y.o. male   admitted on 11/14/2024.  Neighbors found patient down, EMS called, patient brought to hospital  with resistance to EMS.  He was ultimately IVC to remain in the hospital.  Dense psychosocial history   Admitted for treatment and stabilization   Admitted to ICU 12/3 12/4 Intubated, bronch with BAL, right thoracentesis obtained  12/5 Phenobarb taper restarted yesterday, has helped, currently weaning off fentanyl   12/8 failed SBT, bronch >> looks like has endobronchial involvement cancer; MRI brain/C spine no mets 12/10 increased oxygen  needs requiring 80% FiO2 and 10 PEEP 12/17 remains intubated, full support, tenuous    Family face treatment option decisions, advanced directive decisions and anticipatory care needs.    Subjective  Extensive chart review has been completed prior to meeting with patient/family  including labs, vital signs, progress/consult notes, orders, medications and available advance directive documents.   This NP to  bedside as a follow up for palliative medicine needs and emotional support, no family at bedside.    Discussed with primary team and nursing staff.  At this time as discussed with family, Initial PMT GOCs 11-21-24,   allow time for outcomes.    Family remains hopeful for improvement and are open to all offered and available medical interventions to prolong life.  Over the past two days I have left messages for wife and sister/[name], await callbacks.  Made myself available if family were to  arrive at bedside, discussed with nursing.  No family to bedside only a friend today.    PMT  will continue to support holistically.    25 minutes  Detailed review of medical records ( labs, imaging, vital signs), medically appropriate exam ( MS, skin, cardiac,  resp)   discussed with treatment team, counseling and education to patient, family, staff, documenting clinical information, medication management, coordination of care    [name] NP  Palliative Medicine Team Team Phone # [phone] Pager [phone]

## 2024-11-30 ENCOUNTER — Ambulatory Visit (HOSPITAL_COMMUNITY): Payer: Self-pay | Admitting: Pharmacist

## 2024-11-30 ENCOUNTER — Inpatient Hospital Stay (HOSPITAL_COMMUNITY)

## 2024-11-30 LAB — POCT I-STAT 7, (LYTES, BLD GAS, ICA,H+H)
Acid-Base Excess: 3 mmol/L — ABNORMAL HIGH (ref 0.0–2.0)
Acid-Base Excess: 0 mmol/L (ref 0.0–2.0)
Acid-Base Excess: 3 mmol/L — ABNORMAL HIGH (ref 0.0–2.0)
Bicarbonate: 29.4 mmol/L — ABNORMAL HIGH (ref 20.0–28.0)
Bicarbonate: 27.5 mmol/L (ref 20.0–28.0)
Bicarbonate: 30.3 mmol/L — ABNORMAL HIGH (ref 20.0–28.0)
Calcium, Ion: 1.21 mmol/L (ref 1.15–1.40)
Calcium, Ion: 1.22 mmol/L (ref 1.15–1.40)
Calcium, Ion: 1.23 mmol/L (ref 1.15–1.40)
HCT: 44 % (ref 39.0–52.0)
HCT: 44 % (ref 39.0–52.0)
HCT: 43 % (ref 39.0–52.0)
Hemoglobin: 15 g/dL (ref 13.0–17.0)
Hemoglobin: 15 g/dL (ref 13.0–17.0)
Hemoglobin: 14.6 g/dL (ref 13.0–17.0)
O2 Saturation: 100 %
O2 Saturation: 89 %
O2 Saturation: 99 %
Patient temperature: 99.1
Patient temperature: 99.1
Potassium: 4.9 mmol/L (ref 3.5–5.1)
Potassium: 5.1 mmol/L (ref 3.5–5.1)
Potassium: 5.9 mmol/L — ABNORMAL HIGH (ref 3.5–5.1)
Sodium: 136 mmol/L (ref 135–145)
Sodium: 137 mmol/L (ref 135–145)
Sodium: 136 mmol/L (ref 135–145)
TCO2: 31 mmol/L (ref 22–32)
TCO2: 29 mmol/L (ref 22–32)
TCO2: 32 mmol/L (ref 22–32)
pCO2 arterial: 51.1 mmHg — ABNORMAL HIGH (ref 32–48)
pCO2 arterial: 56.2 mmHg — ABNORMAL HIGH (ref 32–48)
pCO2 arterial: 59.6 mmHg — ABNORMAL HIGH (ref 32–48)
pH, Arterial: 7.369 (ref 7.35–7.45)
pH, Arterial: 7.297 — ABNORMAL LOW (ref 7.35–7.45)
pH, Arterial: 7.316 — ABNORMAL LOW (ref 7.35–7.45)
pO2, Arterial: 244 mmHg — ABNORMAL HIGH (ref 83–108)
pO2, Arterial: 64 mmHg — ABNORMAL LOW (ref 83–108)
pO2, Arterial: 162 mmHg — ABNORMAL HIGH (ref 83–108)

## 2024-11-30 LAB — BASIC METABOLIC PANEL WITH GFR
Anion gap: 7 (ref 5–15)
BUN: 35 mg/dL — ABNORMAL HIGH (ref 6–20)
CO2: 28 mmol/L (ref 22–32)
Calcium: 9.1 mg/dL (ref 8.9–10.3)
Chloride: 101 mmol/L (ref 98–111)
Creatinine, Ser: 0.71 mg/dL (ref 0.61–1.24)
GFR, Estimated: >60 mL/min (ref >60)
Glucose, Bld: 136 mg/dL — ABNORMAL HIGH (ref 70–99)
Potassium: 5.1 mmol/L (ref 3.5–5.1)
Sodium: 136 mmol/L (ref 135–145)

## 2024-11-30 LAB — HEPARIN LEVEL (UNFRACTIONATED)
Heparin Unfractionated: 0.44 [IU]/mL (ref 0.30–0.70)
Heparin Unfractionated: 0.45 [IU]/mL (ref 0.30–0.70)

## 2024-11-30 LAB — CULTURE, RESPIRATORY W GRAM STAIN: Gram Stain: NONE SEEN

## 2024-11-30 LAB — CBC
HCT: 42.9 % (ref 39.0–52.0)
Hemoglobin: 14 g/dL (ref 13.0–17.0)
MCH: 33.2 pg (ref 26.0–34.0)
MCHC: 32.6 g/dL (ref 30.0–36.0)
MCV: 101.7 fL — ABNORMAL HIGH (ref 80.0–100.0)
Platelets: 452 K/uL — ABNORMAL HIGH (ref 150–400)
RBC: 4.22 MIL/uL (ref 4.22–5.81)
RDW: 14.6 % (ref 11.5–15.5)
WBC: 13.3 K/uL — ABNORMAL HIGH (ref 4.0–10.5)
nRBC: 0 % (ref 0.0–0.2)

## 2024-11-30 LAB — LIPID PANEL
Cholesterol: 113 mg/dL (ref 0–200)
HDL: 46 mg/dL (ref >40)
LDL Cholesterol: 49 mg/dL (ref 0–99)
Total CHOL/HDL Ratio: 2.5 ratio
Triglycerides: 88 mg/dL (ref <150)
VLDL: 18 mg/dL (ref 0–40)

## 2024-11-30 LAB — GLUCOSE, CAPILLARY
Glucose-Capillary: 109 mg/dL — ABNORMAL HIGH (ref 70–99)
Glucose-Capillary: 114 mg/dL — ABNORMAL HIGH (ref 70–99)
Glucose-Capillary: 120 mg/dL — ABNORMAL HIGH (ref 70–99)
Glucose-Capillary: 143 mg/dL — ABNORMAL HIGH (ref 70–99)
Glucose-Capillary: 168 mg/dL — ABNORMAL HIGH (ref 70–99)

## 2024-11-30 NOTE — Progress Notes (Signed)
 PHARMACY - ANTICOAGULATION CONSULT NOTE  Pharmacy Consult:  Heparin  Indication: Acute femoral DVT  Allergies  Allergen Reactions   Dilaudid  [Hydromorphone  Hcl] Nausea Only and Other (See Comments)    Bradycardia  Hyperthermia    Tape Itching and Dermatitis    Patient Measurements: Height: 5' 9 (175.3 cm) Weight: 89.4 kg (197 lb 1.5 oz) (bed error while trying to weight pt, UTA) IBW/kg (Calculated) : 70.7 HEPARIN  DW (KG): 83  Vital Signs: Temp: 98.9 F (37.2 C) (12/18 0754) Temp Source: Axillary (12/18 0754) BP: 108/86 (12/18 0800) Pulse Rate: 118 (12/18 0700)  Labs: Recent Labs    11/28/24 0303 11/28/24 1015 11/29/24 0441 11/29/24 0458 11/29/24 1205 11/29/24 1207 11/29/24 1324 11/29/24 1506 11/30/24 0108 11/30/24 0524 11/30/24 0634 11/30/24 0734  HGB 14.9   < > 14.6   < >  --    < > 14.6  --   --  14.0 15.0  --   HCT 47.6   < > 45.6   < >  --    < > 43.0  --   --  42.9 44.0  --   PLT 311  --  420*  --   --   --   --   --   --  452*  --   --   HEPARINUNFRC 0.32  --  1.02*  --   --   --   --  0.80* 0.44  --   --   --   CREATININE 0.69  --  1.26*  --  1.09  --   --   --   --   --   --  0.71   < > = values in this interval not displayed.    Estimated Creatinine Clearance: 116.8 mL/min (by C-G formula based on SCr of 0.71 mg/dL).  Assessment: 65 YOM presented with AWS, PNA and pleural effusion.  Bronch on 12/8 concern for recurrent cancer.  Doppler positive for femoral DVT and Pharmacy consulted to dose IV heparin  as MRI is negative for mets.  Heparin  level therapeutic at 0.45. HgB 15.0 and PLTs 452. No issues noted.   Goal of Therapy:  Heparin  level 0.3-0.5 units/ml Monitor platelets by anticoagulation protocol: Yes   Plan:  Continue heparin  to 1800 units/hr.  Daily heparin  level and CBC Monitor closely for bleeding worsening/resolution.   Thank you for allowing pharmacy to participate in this patient's care.  Powell Blush, PharmD, BCCCP  Clinical  Pharmacist 11/30/2024 8:36 AM

## 2024-11-30 NOTE — Progress Notes (Signed)
°  This RT & RN  turned pt's arms and head turned to the left without incident. VSS. Suction catheter passes easily. RT will monitor.

## 2024-11-30 NOTE — Progress Notes (Signed)
 Nutrition Follow-up  DOCUMENTATION CODES:   Non-severe (moderate) malnutrition in context of chronic illness (cancer)  INTERVENTION:   Continue tube feeding via cortrak tube: Osmolite 1.5 at 65 ml/h (1560 ml per day) Prosource TF20 60 ml BID Provides 2500 kcal, 138 gm protein, 1988 ml free water  daily  Propofol  providing additional kcal when infusing  Continue MVI with minerals   200 ml free water  every 8 hours Total free water : 2588 ml   NUTRITION DIAGNOSIS:   Moderate Malnutrition related to cancer and cancer related treatments as evidenced by mild fat depletion, moderate fat depletion, moderate muscle depletion. Ongoing.   GOAL:   Patient will meet greater than or equal to 90% of their needs Met.   MONITOR:   Diet advancement, Vent status, TF tolerance, Labs  REASON FOR ASSESSMENT:   Consult Enteral/tube feeding initiation and management  ASSESSMENT:   Pt with hx of CVA, HTN. DVT, CHF, CAD, chronic respiratory failure (on 4-5L [place]), COPD, NSCLC stage 3b (s/p palliative radiation d/t noncompliance w/ chemo and immunotherapy), and polysubstance abuse. Admitted for encephalopathy and acute on chronic respiratory failure in the setting of alcohol intoxication.   Pt discussed during ICU rounds and with RN and MD, plan to prone with fiO2 100% and PEEP 12. Pt has required prone positioning since 12/16. Unable to wean paralytic.  Discussed with RN no bm since 12/15, fiber d/c'ed and prn colace given.  Palliative care following for GOC  12/3 - Admitted to ICU 12/4 - intubated; s/p bronch, R thoracentesis 12/5 - s/p cortrak placement; tip gastric   Medications reviewed and include: folic acid , SSI every 4 hours, solumedrol, MVI with minerals, protonix  Nimbex  Fentanyl   Heparin  Levophed  @ Propofol  @ 41 ml/hr 1082 kcal   Labs reviewed:  Na 137 K 5.1 TG 88 CBG's:  110-148  UOP 4575 ml  I&O +3491 ml since admission +690 ml + x 24 hours  12/2 Admit weight  88 kg 12/11 current weight 89.4 kg Ongoing mild, non-pitting edema  Diet Order:   Diet Order             Diet NPO time specified  Diet effective now                   EDUCATION NEEDS:   Not appropriate for education at this time  Skin:  Skin Assessment: Reviewed RN Assessment  Last BM:  12/15 type 6  Height:   Ht Readings from Last 1 Encounters:  11/30/24 5' 9 (1.753 m)    Weight:   Wt Readings from Last 1 Encounters:  11/30/24 89.4 kg    Ideal Body Weight:  72.7 kg  BMI:  Body mass index is 29.11 kg/m.  Estimated Nutritional Needs:   Kcal:  2300-2500  Protein:  120-140 gm  Fluid:  >2L  Rocky Gladden P., RD, LDN, CNSC See AMiON for contact information

## 2024-11-30 NOTE — Progress Notes (Signed)
 RT & RN x2  turned pt's arms without incident. We were not able to turn his head at this time due to his lines occluding. VSS. Suction catheter passes easily. RT will monitor.

## 2024-11-30 NOTE — Progress Notes (Signed)
 RT and RN rotated PT's arms and head turned to the right, the pt started losing volumes on the vent and peak pressures went up to 40. This RT was able to advance the suction catheter slowly suctioning out thick bright red blood. RT & RN X2 then turned pt's head back to original side, still losing volumes. MD called. This RT placed some saline down the ETT and pulled out copious amounts of blood. Bronch done at bedside with volumes returning.   This RT & RN X2 turned pt's arms and head turned to the right without incident. VSS. Suction catheter passes easily. RT will monitor.

## 2024-11-30 NOTE — Progress Notes (Addendum)
 RT  to room for proning. ETT holder removed and mepilex placed on cheeks and above mouth. ETT then secured with cloth tape. RN X 4 and RT X 2 placed PT in prone position with no complications. PT's face turned to his right side with left cheek down. Vitals are stable. ABG to be drawn in one hour. RT will continue to monitor.

## 2024-11-30 NOTE — Progress Notes (Addendum)
 RT NOTE: RT to room for supination. Cloth tape removed and commercial tube holder placed. RN X 4 and RT placed PT in supine position with no complications. Vitals are stable. ABG to be drawn in one hour. RT will continue to monitor.

## 2024-11-30 NOTE — Progress Notes (Signed)
 Called bedside to see this patient emergently as he was losing tidal volumes.  There was blood-tinged secretions in the endotracheal inline suction.  I did emergent bronchoscopy which did not show any active bleeding.  Trachea and airways were clear.  Bronchial mucosa was very friable.  No secretions noted  At this stage we will hold heparin  and reevaluate restarting heparin  in the morning.  If we cannot anticoagulate with consideration for IVC once the ARDS improves. After bronchoscopy the tidal volumes and minute ventilation is back up to normal. Will get an emergent chest x-ray.  Tamela Stakes, MD  Attending Physician, Critical Care Medicine [place] [facility] See Amion for pager If no response to pager, please call [phone] until 7pm After 7pm, Please call E-link [phone]

## 2024-11-30 NOTE — Plan of Care (Signed)
 Palliative care plan of care  Notified this AM that patient's wife was at the bedside as palliative care team has been trying to reach her to continue to discuss clinical course and goals of care.  Unfortunately, I was engaged in other patient care at time of notification.  When I arrived at bedside, she had already departed.  Unable to reach her via phone.  Appreciate staff notification of her presence.  Will continue to attempt follow-up.  Amaryllis Meissner, MD [facility] Palliative Medicine Team [phone]

## 2024-11-30 NOTE — Procedures (Signed)
 Bronchoscopy Procedure Note  Richard [name]  [id number]  Nov 19, 1971  Date:11/30/2024  Time:3:00 PM   Provider Performing:Kosei Rhodes   Procedure(s):  Flexible Bronchoscopy [phone])  Indication(s) Bleeding during inline suction with ETT.  Consent Procedure was done emergently   Anesthesia Fentanyl , propofol  Nimbex    Time Out Verified patient identification, verified procedure, site/side was marked, verified correct patient position, special equipment/implants available, medications/allergies/relevant history reviewed, required imaging and test results available.   Sterile Technique Usual hand hygiene, masks, gowns, and gloves were used   Procedure Description Bronchoscope advanced through endotracheal tube and into airway.  Airways were examined down to subsegmental level with findings noted below.   Following diagnostic evaluation  Findings: She is bronchoscope was easily passed through ETT trachea main carina is visualized.  There is friable mucosa but no active bleeding in the right mainstem and left mainstem.  There is no secretions to aspirate.  He had bronchial airways looked clear.   Complications/Tolerance None; patient tolerated the procedure well. Chest X-ray is needed post procedure.   EBL Minimal   Specimen(s) NONE  Tamela Stakes, MD  Attending Physician, Critical Care Medicine [place] [facility] See Amion for pager If no response to pager, please call [phone] until 7pm After 7pm, Please call E-link [phone]

## 2024-11-30 NOTE — Progress Notes (Signed)
 PHARMACY - ANTICOAGULATION CONSULT NOTE  Pharmacy Consult:  Heparin  Indication: Acute femoral DVT  Allergies  Allergen Reactions   Dilaudid  [Hydromorphone  Hcl] Nausea Only and Other (See Comments)    Bradycardia  Hyperthermia    Tape Itching and Dermatitis    Patient Measurements: Height: 5' 9 (175.3 cm) Weight: 89.4 kg (197 lb 1.5 oz) (bed error while trying to weight pt, UTA) IBW/kg (Calculated) : 70.7 HEPARIN  DW (KG): 83  Vital Signs: Temp: 98.9 F (37.2 C) (12/18 0754) Temp Source: Axillary (12/18 0754) BP: 106/84 (12/18 1300) Pulse Rate: 109 (12/18 1345)  Labs: Recent Labs    11/28/24 0303 11/28/24 1015 11/29/24 0441 11/29/24 0458 11/29/24 1205 11/29/24 1207 11/29/24 1506 11/30/24 0108 11/30/24 0524 11/30/24 0634 11/30/24 0734 11/30/24 0839 11/30/24 1109 11/30/24 1257  HGB 14.9   < > 14.6   < >  --    < >  --   --  14.0 15.0  --   --  15.0 14.6  HCT 47.6   < > 45.6   < >  --    < >  --   --  42.9 44.0  --   --  44.0 43.0  PLT 311  --  420*  --   --   --   --   --  452*  --   --   --   --   --   HEPARINUNFRC 0.32  --  1.02*  --   --   --  0.80* 0.44  --   --   --  0.45  --   --   CREATININE 0.69  --  1.26*  --  1.09  --   --   --   --   --  0.71  --   --   --    < > = values in this interval not displayed.    Estimated Creatinine Clearance: 116.8 mL/min (by C-G formula based on SCr of 0.71 mg/dL).  Assessment: 8 YOM presented with AWS, PNA and pleural effusion.  Bronch on 12/8 concern for recurrent cancer.  Doppler positive for femoral DVT and Pharmacy consulted to dose IV heparin  as MRI is negative for mets.  Heparin  level therapeutic at 0.45. HgB 15.0 and PLTs 452. No issues noted.   12/18 PM: Nurse stopped heparin  drip for patient bleeding from ET tube.  Goal of Therapy:  Heparin  level 0.3-0.5 units/ml Monitor platelets by anticoagulation protocol: Yes   Plan:  Hold heparin  drip  Follow up heparin  restart D/c daily heparin  levels for now   Continue to monitor CBC with active bleeding Monitor closely for bleeding worsening/resolution.   Thank you for allowing pharmacy to participate in this patient's care.  R. Samual Satterfield, PharmD PGY-1 Acute Care Pharmacy Resident [facility] Please refer to [facility] for [facility] Pharmacy numbers 11/30/2024 2:57 PM

## 2024-11-30 NOTE — Progress Notes (Signed)
 PHARMACY - ANTICOAGULATION CONSULT NOTE  Pharmacy Consult:  Heparin  Indication: Acute femoral DVT  Allergies  Allergen Reactions   Dilaudid  [Hydromorphone  Hcl] Nausea Only and Other (See Comments)    Bradycardia  Hyperthermia    Tape Itching and Dermatitis    Patient Measurements: Height: 5' 9 (175.3 cm) Weight: 89 kg (196 lb 3.4 oz) IBW/kg (Calculated) : 70.7 HEPARIN  DW (KG): 83  Vital Signs: Temp: 98.6 F (37 C) (12/18 0000) Temp Source: Oral (12/18 0000) BP: 126/95 (12/18 0100) Pulse Rate: 107 (12/18 0100)  Labs: Recent Labs    11/27/24 0447 11/27/24 1454 11/28/24 0303 11/28/24 1015 11/29/24 0441 11/29/24 0458 11/29/24 0825 11/29/24 1205 11/29/24 1207 11/29/24 1324 11/29/24 1506 11/30/24 0108  HGB 14.7  --  14.9   < > 14.6   < > 15.3  --  14.3 14.6  --   --   HCT 46.4  --  47.6   < > 45.6   < > 45.0  --  42.0 43.0  --   --   PLT 281  --  311  --  420*  --   --   --   --   --   --   --   HEPARINUNFRC 0.27*   < > 0.32  --  1.02*  --   --   --   --   --  0.80* 0.44  CREATININE 0.73  --  0.69  --  1.26*  --   --  1.09  --   --   --   --    < > = values in this interval not displayed.    Estimated Creatinine Clearance: 85.5 mL/min (by C-G formula based on SCr of 1.09 mg/dL).  Assessment: 33 YOM presented with AWS, PNA and pleural effusion.  Bronch on 12/8 concern for recurrent cancer.  Doppler positive for femoral DVT and Pharmacy consulted to dose IV heparin  as MRI is negative for mets.  Heparin  level supra-therapeutic at 1.02, HgB 14.6 and PLTs 420. No issues with infusion per RN and drawn via A-line.   AM: heparin  level therapeutic on 1800 units/hr. No issues with the infusion or bleeding reported.  Goal of Therapy:  Heparin  level 0.3-0.5 units/ml Monitor platelets by anticoagulation protocol: Yes   Plan:  Continue heparin  to 1800 units/hr.  Confirmatory heparin  level in 8 hours Daily heparin  level and CBC Monitor closely for bleeding  worsening/resolution.   Thank you for allowing pharmacy to participate in this patient's care.  Lynwood Poplar, PharmD, BCPS Clinical Pharmacist 11/30/2024 1:54 AM
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
## 2024-11-30 NOTE — Progress Notes (Signed)
 RT and RN rotated PT's arms and head turned to the left with no complications. Suction catheter passes easily. Vitals are stable.

## 2024-12-01 ENCOUNTER — Inpatient Hospital Stay (HOSPITAL_COMMUNITY)

## 2024-12-01 LAB — CBC
HCT: 40.5 % (ref 39.0–52.0)
Hemoglobin: 13.1 g/dL (ref 13.0–17.0)
MCH: 33.7 pg (ref 26.0–34.0)
MCHC: 32.3 g/dL (ref 30.0–36.0)
MCV: 104.1 fL — ABNORMAL HIGH (ref 80.0–100.0)
Platelets: 375 K/uL (ref 150–400)
RBC: 3.89 MIL/uL — ABNORMAL LOW (ref 4.22–5.81)
RDW: 14.6 % (ref 11.5–15.5)
WBC: 12.2 K/uL — ABNORMAL HIGH (ref 4.0–10.5)
nRBC: 0 % (ref 0.0–0.2)

## 2024-12-01 LAB — POCT I-STAT 7, (LYTES, BLD GAS, ICA,H+H)
Acid-Base Excess: 4 mmol/L — ABNORMAL HIGH (ref 0.0–2.0)
Acid-base deficit: 1 mmol/L (ref 0.0–2.0)
Bicarbonate: 30.4 mmol/L — ABNORMAL HIGH (ref 20.0–28.0)
Bicarbonate: 25.2 mmol/L (ref 20.0–28.0)
Calcium, Ion: 1.18 mmol/L (ref 1.15–1.40)
Calcium, Ion: 1.06 mmol/L — ABNORMAL LOW (ref 1.15–1.40)
HCT: 46 % (ref 39.0–52.0)
HCT: 34 % — ABNORMAL LOW (ref 39.0–52.0)
Hemoglobin: 15.6 g/dL (ref 13.0–17.0)
Hemoglobin: 11.6 g/dL — ABNORMAL LOW (ref 13.0–17.0)
O2 Saturation: 100 %
O2 Saturation: 99 %
Patient temperature: 99.7
Patient temperature: 99.7
Potassium: 5.2 mmol/L — ABNORMAL HIGH (ref 3.5–5.1)
Potassium: 4.4 mmol/L (ref 3.5–5.1)
Sodium: 136 mmol/L (ref 135–145)
Sodium: 140 mmol/L (ref 135–145)
TCO2: 32 mmol/L (ref 22–32)
TCO2: 27 mmol/L (ref 22–32)
pCO2 arterial: 53.9 mmHg — ABNORMAL HIGH (ref 32–48)
pCO2 arterial: 49 mmHg — ABNORMAL HIGH (ref 32–48)
pH, Arterial: 7.362 (ref 7.35–7.45)
pH, Arterial: 7.322 — ABNORMAL LOW (ref 7.35–7.45)
pO2, Arterial: 237 mmHg — ABNORMAL HIGH (ref 83–108)
pO2, Arterial: 154 mmHg — ABNORMAL HIGH (ref 83–108)

## 2024-12-01 LAB — GLUCOSE, CAPILLARY
Glucose-Capillary: 103 mg/dL — ABNORMAL HIGH (ref 70–99)
Glucose-Capillary: 113 mg/dL — ABNORMAL HIGH (ref 70–99)
Glucose-Capillary: 122 mg/dL — ABNORMAL HIGH (ref 70–99)
Glucose-Capillary: 152 mg/dL — ABNORMAL HIGH (ref 70–99)
Glucose-Capillary: 143 mg/dL — ABNORMAL HIGH (ref 70–99)

## 2024-12-01 LAB — HEPARIN LEVEL (UNFRACTIONATED): Heparin Unfractionated: 0.38 [IU]/mL (ref 0.30–0.70)

## 2024-12-01 MED ORDER — METHYLPREDNISOLONE SODIUM SUCC 125 MG IJ SOLR
60.0000 mg | Freq: Every day | INTRAMUSCULAR | Status: DC
  Administered 2024-12-02 – 2024-12-05 (×4): 60 mg via INTRAVENOUS
  Filled 2024-12-01 (×4): qty 2

## 2024-12-01 MED ORDER — SENNOSIDES-DOCUSATE SODIUM 8.6-50 MG PO TABS
1.0000 | ORAL_TABLET | Freq: Every day | ORAL | Status: DC
  Administered 2024-12-01 – 2024-12-03 (×3): 1
  Filled 2024-12-01 (×3): qty 1

## 2024-12-01 MED ORDER — FREE WATER
150.0000 mL | Freq: Three times a day (TID) | Status: DC
  Administered 2024-12-01 – 2024-12-04 (×8): 150 mL

## 2024-12-01 MED ORDER — HEPARIN (PORCINE) 25000 UT/250ML-% IV SOLN
1700.0000 [IU]/h | INTRAVENOUS | Status: DC
  Administered 2024-12-01 – 2024-12-02 (×3): 1800 [IU]/h via INTRAVENOUS
  Administered 2024-12-03: 1500 [IU]/h via INTRAVENOUS
  Administered 2024-12-04 – 2024-12-05 (×3): 1700 [IU]/h via INTRAVENOUS
  Filled 2024-12-01 (×7): qty 250

## 2024-12-01 MED ORDER — POLYETHYLENE GLYCOL 3350 17 G PO PACK
17.0000 g | PACK | Freq: Every day | ORAL | Status: DC
  Administered 2024-12-01 – 2024-12-03 (×3): 17 g
  Filled 2024-12-01 (×3): qty 1

## 2024-12-01 NOTE — Progress Notes (Signed)
 CCM Dr. Gatha notified that Pt TENS of Four, this RN gets 1/4 at 21. BIS is 40.

## 2024-12-01 NOTE — Progress Notes (Signed)
 PHARMACY - ANTICOAGULATION CONSULT NOTE  Pharmacy Consult:  Heparin  Indication: Acute femoral DVT  Allergies  Allergen Reactions   Dilaudid  [Hydromorphone  Hcl] Nausea Only and Other (See Comments)    Bradycardia  Hyperthermia    Tape Itching and Dermatitis    Patient Measurements: Height: 5' 9 (175.3 cm) Weight: 89.2 kg (196 lb 10.4 oz) IBW/kg (Calculated) : 70.7 HEPARIN  DW (KG): 83  Vital Signs: Temp: 99.7 F (37.6 C) (12/19 0800) Temp Source: Axillary (12/19 0800) BP: 96/76 (12/19 1200) Pulse Rate: 113 (12/19 1230)  Labs: Recent Labs    11/29/24 0441 11/29/24 0458 11/29/24 1205 11/29/24 1207 11/29/24 1506 11/30/24 0108 11/30/24 0524 11/30/24 0634 11/30/24 0734 11/30/24 0839 11/30/24 1109 12/01/24 0500 12/01/24 0828 12/01/24 1109  HGB 14.6   < >  --    < >  --   --  14.0   < >  --   --    < > 13.1 15.6 11.6*  HCT 45.6   < >  --    < >  --   --  42.9   < >  --   --    < > 40.5 46.0 34.0*  PLT 420*  --   --   --   --   --  452*  --   --   --   --  375  --   --   HEPARINUNFRC 1.02*  --   --   --  0.80* 0.44  --   --   --  0.45  --   --   --   --   CREATININE 1.26*  --  1.09  --   --   --   --   --  0.71  --   --   --   --   --    < > = values in this interval not displayed.    Estimated Creatinine Clearance: 116.6 mL/min (by C-G formula based on SCr of 0.71 mg/dL).  Assessment: 54 YOM presented with AWS, PNA and pleural effusion.  Bronch on 12/8 concern for recurrent cancer.  Doppler positive for femoral DVT and Pharmacy consulted to dose IV heparin  as MRI is negative for mets.  Heparin  stopped 12/18 due to bloody secretions, HgB stable at 13.1 and PLTs 375 on morning CBC. Heparin  previously therapeutic on 1800u/hr. Spoke with CCM, ok to resume. No bloody secretions noted today.   Goal of Therapy:  Heparin  level 0.3-0.5 units/ml Monitor platelets by anticoagulation protocol: Yes   Plan:  Resume heparin  to 1800 units/hr.  8 hour heparin  level.  Daily  CBC.  Monitor closely for bleeding worsening/resolution.   Thank you for allowing pharmacy to participate in this patient's care.  Powell Blush, PharmD, BCCCP  Clinical Pharmacist 12/01/2024 1:29 PM

## 2024-12-01 NOTE — Progress Notes (Signed)
 RT and Rns rotated patient to the supine position.  Patient tolerated well.  Vitals are stable and will continue to monitor.

## 2024-12-01 NOTE — TOC Progression Note (Signed)
 Transition of Care [facility]) - Progression Note    Patient Details  Name: Richard [name] MRN: [id number] Date of Birth: 1971-07-16  Transition of Care [facility]) CM/SW Contact  Inocente GORMAN Kindle, LCSW Phone Number: 12/01/2024, 5:53 PM  Clinical Narrative:    ICM continuing to follow for medical progression.      Barriers to Discharge: Continued Medical Work up               Expected Discharge Plan and Services   Discharge Planning Services: CM Consult                                           Social Drivers of Health (SDOH) Interventions SDOH Screenings   Food Insecurity: No Food Insecurity (09/23/2024)  Housing: Low Risk (09/23/2024)  Transportation Needs: Unmet Transportation Needs (09/23/2024)  Utilities: Not At Risk (09/23/2024)  Financial Resource Strain: Medium Risk (09/17/2023)  Physical Activity: Not on File (08/30/2023)   Received from [facility]  Social Connections: Not on File (08/30/2023)   Received from [facility]  Stress: Not on File (08/30/2023)   Received from [facility]  Tobacco Use: High Risk (11/14/2024)    Readmission Risk Interventions    09/25/2024    9:55 AM  Readmission Risk Prevention Plan  Transportation Screening Complete  Medication Review (RN Care Manager) Complete  PCP or Specialist appointment within 3-5 days of discharge Complete  HRI or Home Care Consult Complete  Palliative Care Screening Not Applicable  Skilled Nursing Facility Not Applicable
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
## 2024-12-01 NOTE — Progress Notes (Signed)
 Called the Spouse - Richard [name], Sister - Richard [name] and Aunt Richard [name] in the chart. They are not responding. Last time I spoke with wife I requested her to take the hospital calls as I could never reach her on telephone.   Palliative care is trying to co-ordinate a goals of care meeting. If the family remains unavailable we should consult ethics.  Tamela Stakes, MD  Attending Physician, Critical Care Medicine [place] [facility] See Amion for pager If no response to pager, please call [phone] until 7pm After 7pm, Please call E-link [phone]

## 2024-12-01 NOTE — Progress Notes (Signed)
 RT & RN turned pt's arms without incident. We were not able to turn his head at this time due to his lines occluding. VSS. Suction catheter passes easily. RT will monitor.

## 2024-12-01 NOTE — Progress Notes (Addendum)
 0800- Called Spouse in chart to give update and see if she will be attending goals of care meeting today. LVM w/call back information.   1200- Reached out to palliative care to see if family had attended GOC meeting today.   73- Sister Stephane called stating she wanted upate. RN gave update and asked if she was able to make goals of care meeting. Sister stated no because she didn't have a ride. Stated she wanted to speak to CCM, this RN passed message on to Dr. Gatha- CCM.   1745- Palliative Kathlyne, NP reached out to this RN via epic chat to see if family came to see Pt today. They confirmed no GOC meeting took place and they will try and get in touch wit family.

## 2024-12-01 NOTE — Progress Notes (Signed)
 PHARMACY - ANTICOAGULATION CONSULT NOTE  Pharmacy Consult:  Heparin  Indication: Acute femoral DVT  Allergies  Allergen Reactions   Dilaudid  [Hydromorphone  Hcl] Nausea Only and Other (See Comments)    Bradycardia  Hyperthermia    Tape Itching and Dermatitis    Patient Measurements: Height: 5' 9 (175.3 cm) Weight: 89.2 kg (196 lb 10.4 oz) IBW/kg (Calculated) : 70.7 HEPARIN  DW (KG): 83  Vital Signs: Temp: 98.7 F (37.1 C) (12/19 2000) Temp Source: Axillary (12/19 2000) BP: 108/77 (12/19 2300) Pulse Rate: 105 (12/19 2300)  Labs: Recent Labs    11/29/24 0441 11/29/24 0458 11/29/24 1205 11/29/24 1207 11/30/24 0108 11/30/24 0524 11/30/24 0634 11/30/24 0734 11/30/24 0839 11/30/24 1109 12/01/24 0500 12/01/24 0828 12/01/24 1109 12/01/24 2245  HGB 14.6   < >  --    < >  --  14.0   < >  --   --    < > 13.1 15.6 11.6*  --   HCT 45.6   < >  --    < >  --  42.9   < >  --   --    < > 40.5 46.0 34.0*  --   PLT 420*  --   --   --   --  452*  --   --   --   --  375  --   --   --   HEPARINUNFRC 1.02*  --   --    < > 0.44  --   --   --  0.45  --   --   --   --  0.38  CREATININE 1.26*  --  1.09  --   --   --   --  0.71  --   --   --   --   --   --    < > = values in this interval not displayed.    Estimated Creatinine Clearance: 116.6 mL/min (by C-G formula based on SCr of 0.71 mg/dL).  Assessment: 29 YOM presented with AWS, PNA and pleural effusion.  Bronch on 12/8 concern for recurrent cancer.  Doppler positive for femoral DVT and Pharmacy consulted to dose IV heparin  as MRI is negative for mets.  Heparin  stopped 12/18 due to bloody secretions, HgB stable at 13.1 and PLTs 375 on morning CBC. Heparin  previously therapeutic on 1800u/hr. Spoke with CCM, ok to resume. No bloody secretions noted today.   PM: heparin  level therapeutic at 1800 units/hr. Per RN, no bloody secretions since restarting or other signs/symptoms of bleeding.   Goal of Therapy:  Heparin  level 0.3-0.5  units/ml Monitor platelets by anticoagulation protocol: Yes   Plan:  Continue heparin  at 1800 units/hr.  Confirmatory heparin  level with AM labs Daily CBC.  Monitor closely for bleeding worsening/resolution.   Lynwood Poplar, PharmD, BCPS Clinical Pharmacist 12/01/2024 11:55 PM

## 2024-12-01 NOTE — Progress Notes (Signed)
 ET tube holder placed by RTx 2 at bedside. ETT secured at 26 measured from the lips. Pt tolerated well, vitals stable throughout.     12/01/24 1450  Airway 8 mm  Placement Date/Time: 11/28/24 0935   Placed By: ICU physician  Airway Device: Endotracheal Tube  Laryngoscope Blade: 4  ETT Types: Oral  Size (mm): 8 mm  Cuffed: Cuffed  Insertion attempts: 1  Airway Equipment: Stylet;Video Laryngoscope  Placement Con...  Secured at (cm) 26 cm  Measured From [employer]  Secured By (S)  Publishing Rights Manager (pt not getting)  Bite Block No  Tube Holder Repositioned Yes  Prone position No  Cuff Pressure (cm H2O) Clear OR 27-39 CmH2O  Site Condition Dry

## 2024-12-02 ENCOUNTER — Inpatient Hospital Stay (HOSPITAL_COMMUNITY)

## 2024-12-02 DIAGNOSIS — J151 Pneumonia due to Pseudomonas: Secondary | ICD-10-CM

## 2024-12-02 DIAGNOSIS — J158 Pneumonia due to other specified bacteria: Secondary | ICD-10-CM

## 2024-12-02 DIAGNOSIS — N179 Acute kidney failure, unspecified: Secondary | ICD-10-CM

## 2024-12-02 DIAGNOSIS — Z7189 Other specified counseling: Secondary | ICD-10-CM

## 2024-12-02 LAB — BASIC METABOLIC PANEL WITH GFR
Anion gap: 8 (ref 5–15)
BUN: 48 mg/dL — ABNORMAL HIGH (ref 6–20)
CO2: 28 mmol/L (ref 22–32)
Calcium: 9 mg/dL (ref 8.9–10.3)
Chloride: 100 mmol/L (ref 98–111)
Creatinine, Ser: 0.74 mg/dL (ref 0.61–1.24)
GFR, Estimated: >60 mL/min
Glucose, Bld: 186 mg/dL — ABNORMAL HIGH (ref 70–99)
Potassium: 5.8 mmol/L — ABNORMAL HIGH (ref 3.5–5.1)
Sodium: 136 mmol/L (ref 135–145)

## 2024-12-02 LAB — GLUCOSE, CAPILLARY
Glucose-Capillary: 129 mg/dL — ABNORMAL HIGH (ref 70–99)
Glucose-Capillary: 143 mg/dL — ABNORMAL HIGH (ref 70–99)
Glucose-Capillary: 147 mg/dL — ABNORMAL HIGH (ref 70–99)
Glucose-Capillary: 158 mg/dL — ABNORMAL HIGH (ref 70–99)
Glucose-Capillary: 160 mg/dL — ABNORMAL HIGH (ref 70–99)
Glucose-Capillary: 215 mg/dL — ABNORMAL HIGH (ref 70–99)

## 2024-12-02 LAB — CBC
HCT: 41.8 % (ref 39.0–52.0)
Hemoglobin: 13 g/dL (ref 13.0–17.0)
MCH: 32.7 pg (ref 26.0–34.0)
MCHC: 31.1 g/dL (ref 30.0–36.0)
MCV: 105.3 fL — ABNORMAL HIGH (ref 80.0–100.0)
Platelets: 422 K/uL — ABNORMAL HIGH (ref 150–400)
RBC: 3.97 MIL/uL — ABNORMAL LOW (ref 4.22–5.81)
RDW: 14.8 % (ref 11.5–15.5)
WBC: 14.5 K/uL — ABNORMAL HIGH (ref 4.0–10.5)
nRBC: 0 % (ref 0.0–0.2)

## 2024-12-02 LAB — TRIGLYCERIDES: Triglycerides: 84 mg/dL

## 2024-12-02 LAB — HEPARIN LEVEL (UNFRACTIONATED): Heparin Unfractionated: 0.4 [IU]/mL (ref 0.30–0.70)

## 2024-12-02 MED ORDER — SODIUM ZIRCONIUM CYCLOSILICATE 5 G PO PACK
5.0000 g | PACK | Freq: Two times a day (BID) | ORAL | Status: AC
  Administered 2024-12-02: 5 g
  Filled 2024-12-02: qty 1

## 2024-12-02 MED ORDER — SODIUM ZIRCONIUM CYCLOSILICATE 5 G PO PACK
5.0000 g | PACK | Freq: Two times a day (BID) | ORAL | Status: DC
  Administered 2024-12-02: 5 g via ORAL
  Filled 2024-12-02 (×2): qty 1

## 2024-12-02 MED ORDER — AMIODARONE HCL IN DEXTROSE 360-4.14 MG/200ML-% IV SOLN
15.0000 mg/h | INTRAVENOUS | Status: DC
  Administered 2024-12-03: 30 mg/h via INTRAVENOUS
  Administered 2024-12-04 – 2024-12-05 (×2): 15 mg/h via INTRAVENOUS
  Filled 2024-12-02 (×4): qty 200

## 2024-12-02 MED ORDER — TRANEXAMIC ACID FOR INHALATION
500.0000 mg | Freq: Three times a day (TID) | RESPIRATORY_TRACT | Status: AC
  Administered 2024-12-03 (×2): 500 mg via RESPIRATORY_TRACT
  Filled 2024-12-02 (×2): qty 5

## 2024-12-02 MED ORDER — AMIODARONE IV BOLUS ONLY 150 MG/100ML
INTRAVENOUS | Status: AC
  Filled 2024-12-02: qty 100

## 2024-12-02 MED ORDER — AMIODARONE LOAD VIA INFUSION
150.0000 mg | Freq: Once | INTRAVENOUS | Status: AC
  Filled 2024-12-02: qty 83.34

## 2024-12-02 MED ORDER — AMIODARONE HCL IN DEXTROSE 360-4.14 MG/200ML-% IV SOLN
INTRAVENOUS | Status: AC
  Administered 2024-12-02: 150 mg via INTRAVENOUS
  Filled 2024-12-02: qty 200

## 2024-12-02 MED ORDER — AMIODARONE HCL IN DEXTROSE 360-4.14 MG/200ML-% IV SOLN
60.0000 mg/h | INTRAVENOUS | Status: AC
  Administered 2024-12-02 (×2): 60 mg/h via INTRAVENOUS

## 2024-12-02 NOTE — Progress Notes (Addendum)
 This RN reached out to CCM- Dr. Theophilus and let him know Potassium level is 5.8 from this mornings labs and that ecg strip is showing peaked T waves and Pt been running irregular HR jumping into the 140s. This RN also asked about plan regarding paralytic therapy as well as sedation. See CCM note for plan of care.

## 2024-12-02 NOTE — Progress Notes (Signed)
 eLink Physician-Brief Progress Note Patient Name: Richard [name] DOB: 08-16-1971 MRN: [id number]   Date of Service  12/02/2024  HPI/Events of Note  Had bloody secretions before. Heparin  stopped. They went away. Restarted heparin  and now they are bloody again. Concern for PE and bleeding endobronchial tumor.  eICU Interventions  Maintain heparin  dosed per pharmacy Start scheduled TXA nebs   0135 -had an episode of sudden hypoxemia associated with sudden hypotension.  Attempted suctioning with no significant response.  Bilateral breath sounds present with right side diminished.  Vent waveforms unchanged.  Will hold paralysis and eventually wean propofol , but obtain chest radiograph and ABG right now.  Rapidly escalated norepinephrine .  Initiate vasopressin .  LR bolus 500 cc.   0140 -progressively developed of bradycardia arrhythmia and worsening hypotension, was periarrest.  Switch to bag mask ventilation and obtain chest radiograph-improved spontaneously with fluids and vasopressors.  CODE BLUE was called, but the patient was never pulseless and no compressions/ACLS performed.  Ground team made aware of progress.  9472 - Hyperkalemia protocol  0533 - back in sinus arrhythmia, increasing vasopressor requirements. Hopefully correcting hyperkalemia improves rhythm    0610 -patient is on maximum norepinephrine  and vasopressin , will initiate epinephrine  infusion.  Obtain ABG.  0704 - Increase RR, needs GOC conversations  Intervention Category Intermediate Interventions: Bleeding - evaluation and treatment with blood products  Kurt Azimi 12/02/2024, 11:57 PM

## 2024-12-02 NOTE — Progress Notes (Signed)
 PHARMACY - ANTICOAGULATION CONSULT NOTE  Pharmacy Consult:  Heparin  Indication: Acute femoral DVT  Allergies  Allergen Reactions   Dilaudid  [Hydromorphone  Hcl] Nausea Only and Other (See Comments)    Bradycardia  Hyperthermia    Tape Itching and Dermatitis    Patient Measurements: Height: 5' 9 (175.3 cm) Weight: 90.7 kg (199 lb 15.3 oz) IBW/kg (Calculated) : 70.7 HEPARIN  DW (KG): 83  Vital Signs: Temp: 99.6 F (37.6 C) (12/20 0400) Temp Source: Axillary (12/20 0400) BP: 112/82 (12/20 0700) Pulse Rate: 111 (12/20 0700)  Labs: Recent Labs    11/29/24 1205 11/29/24 1207 11/30/24 0524 11/30/24 0634 11/30/24 0734 11/30/24 0839 11/30/24 1109 12/01/24 0500 12/01/24 0828 12/01/24 1109 12/01/24 2245 12/02/24 0454  HGB  --    < > 14.0   < >  --   --    < > 13.1 15.6 11.6*  --  13.0  HCT  --    < > 42.9   < >  --   --    < > 40.5 46.0 34.0*  --  41.8  PLT  --   --  452*  --   --   --   --  375  --   --   --  422*  HEPARINUNFRC  --    < >  --   --   --  0.45  --   --   --   --  0.38 0.40  CREATININE 1.09  --   --   --  0.71  --   --   --   --   --   --  0.74   < > = values in this interval not displayed.    Estimated Creatinine Clearance: 117.5 mL/min (by C-G formula based on SCr of 0.74 mg/dL).  Assessment: 60 YOM presented with AWS, PNA and pleural effusion.  Bronch on 12/8 concern for recurrent cancer.  Doppler positive for femoral DVT and Pharmacy consulted to dose IV heparin  as MRI is negative for mets.  Heparin  stopped 12/18 due to bloody secretions, HgB stable at 13.1 and PLTs 375 on morning CBC. Heparin  previously therapeutic on 1800u/hr. Spoke with CCM, ok to resume. No bloody secretions noted today.   Heparin  level therapeutic at 1800 units/hr. CBC stable, no bloody secretions since restarting or other signs/symptoms of bleeding.   Goal of Therapy:  Heparin  level 0.3-0.5 units/ml Monitor platelets by anticoagulation protocol: Yes   Plan:  Continue heparin   drip at 1800 units/hr Daily heparin  level, CBC Monitor closely for bleeding worsening/resolution  Thank you for involving pharmacy in this patient's care.  Delon Sax, PharmD, BCPS Clinical Pharmacist Clinical phone for 12/02/2024 is [phone] 12/02/2024 11:04 AM

## 2024-12-02 NOTE — Progress Notes (Signed)
 CCM notified wife at bedside, CCM came by and had conversation with wife regarding steps forward. Palliative Kathlyne Bolder, NP notified and stated he will be here to speak with wife as soon as he can.

## 2024-12-02 NOTE — Progress Notes (Addendum)
 1000- This RN went to flush feeding tube before 10 AM meds and noticed tube was clogged, protocol applied and feeding tube still clogged. Palliative speaking with family member and will assess need for OG until Monday.   1200- CCM notified that HR consistently in 140s, irregular and Otc is running in the 530s. No new orders.

## 2024-12-02 NOTE — Progress Notes (Signed)
 Notified Dr. Arvel that Pt's HR jumped to 180 and was sustaining. O2 was reading 80%, pleth looked WNL. RRT notified, and ran an iStat, Potassium results read 6.1, CCM aware, see new orders. CCM Ok with scheduled dose of lokelma  on night shift. RN clarified if paralytic, and sedation was necessary. CCM stated to keep for now.
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
## 2024-12-02 NOTE — Progress Notes (Signed)
 eLink Physician-Brief Progress Note Patient Name: Richard [name] DOB: [birth year] MRN: [id number]   Date of Service  12/02/2024  HPI/Events of Note  HR 105 - 112  eICU Interventions  Continue to monitor heart rate.        Shanie Mauzy U Charnika Herbst 12/02/2024, 3:32 AM
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
## 2024-12-03 ENCOUNTER — Inpatient Hospital Stay (HOSPITAL_COMMUNITY)

## 2024-12-03 LAB — BASIC METABOLIC PANEL WITH GFR
Anion gap: 9 (ref 5–15)
Anion gap: 9 (ref 5–15)
BUN: 51 mg/dL — ABNORMAL HIGH (ref 6–20)
BUN: 56 mg/dL — ABNORMAL HIGH (ref 6–20)
CO2: 25 mmol/L (ref 22–32)
CO2: 25 mmol/L (ref 22–32)
Calcium: 8.8 mg/dL — ABNORMAL LOW (ref 8.9–10.3)
Calcium: 8.8 mg/dL — ABNORMAL LOW (ref 8.9–10.3)
Chloride: 96 mmol/L — ABNORMAL LOW (ref 98–111)
Chloride: 97 mmol/L — ABNORMAL LOW (ref 98–111)
Creatinine, Ser: 0.79 mg/dL (ref 0.61–1.24)
Creatinine, Ser: 0.91 mg/dL (ref 0.61–1.24)
GFR, Estimated: >60 mL/min
GFR, Estimated: >60 mL/min
Glucose, Bld: 141 mg/dL — ABNORMAL HIGH (ref 70–99)
Glucose, Bld: 144 mg/dL — ABNORMAL HIGH (ref 70–99)
Potassium: 5.9 mmol/L — ABNORMAL HIGH (ref 3.5–5.1)
Potassium: 5.9 mmol/L — ABNORMAL HIGH (ref 3.5–5.1)
Sodium: 130 mmol/L — ABNORMAL LOW (ref 135–145)
Sodium: 131 mmol/L — ABNORMAL LOW (ref 135–145)

## 2024-12-03 LAB — CBC
HCT: 39.8 % (ref 39.0–52.0)
HCT: 42 % (ref 39.0–52.0)
Hemoglobin: 12.4 g/dL — ABNORMAL LOW (ref 13.0–17.0)
Hemoglobin: 13.1 g/dL (ref 13.0–17.0)
MCH: 32.4 pg (ref 26.0–34.0)
MCH: 33.1 pg (ref 26.0–34.0)
MCHC: 31.2 g/dL (ref 30.0–36.0)
MCHC: 31.2 g/dL (ref 30.0–36.0)
MCV: 103.9 fL — ABNORMAL HIGH (ref 80.0–100.0)
MCV: 106.1 fL — ABNORMAL HIGH (ref 80.0–100.0)
Platelets: 369 K/uL (ref 150–400)
Platelets: 389 K/uL (ref 150–400)
RBC: 3.83 MIL/uL — ABNORMAL LOW (ref 4.22–5.81)
RBC: 3.96 MIL/uL — ABNORMAL LOW (ref 4.22–5.81)
RDW: 14.9 % (ref 11.5–15.5)
RDW: 15 % (ref 11.5–15.5)
WBC: 19.8 K/uL — ABNORMAL HIGH (ref 4.0–10.5)
WBC: 23.3 K/uL — ABNORMAL HIGH (ref 4.0–10.5)
nRBC: 0 % (ref 0.0–0.2)
nRBC: 0.1 % (ref 0.0–0.2)

## 2024-12-03 LAB — HEPARIN LEVEL (UNFRACTIONATED)
Heparin Unfractionated: 0.17 [IU]/mL — ABNORMAL LOW (ref 0.30–0.70)
Heparin Unfractionated: 0.27 [IU]/mL — ABNORMAL LOW (ref 0.30–0.70)
Heparin Unfractionated: 0.43 [IU]/mL (ref 0.30–0.70)

## 2024-12-03 LAB — GLUCOSE, CAPILLARY
Glucose-Capillary: 113 mg/dL — ABNORMAL HIGH (ref 70–99)
Glucose-Capillary: 121 mg/dL — ABNORMAL HIGH (ref 70–99)
Glucose-Capillary: 150 mg/dL — ABNORMAL HIGH (ref 70–99)
Glucose-Capillary: 162 mg/dL — ABNORMAL HIGH (ref 70–99)
Glucose-Capillary: 179 mg/dL — ABNORMAL HIGH (ref 70–99)
Glucose-Capillary: 184 mg/dL — ABNORMAL HIGH (ref 70–99)
Glucose-Capillary: 211 mg/dL — ABNORMAL HIGH (ref 70–99)
Glucose-Capillary: 217 mg/dL — ABNORMAL HIGH (ref 70–99)

## 2024-12-03 LAB — PHOSPHORUS: Phosphorus: 4 mg/dL (ref 2.5–4.6)

## 2024-12-03 LAB — POTASSIUM
Potassium: 5.8 mmol/L — ABNORMAL HIGH (ref 3.5–5.1)
Potassium: 6.1 mmol/L — ABNORMAL HIGH (ref 3.5–5.1)
Potassium: 6 mmol/L — ABNORMAL HIGH (ref 3.5–5.1)
Potassium: 6 mmol/L — ABNORMAL HIGH (ref 3.5–5.1)

## 2024-12-03 LAB — MAGNESIUM: Magnesium: 2.9 mg/dL — ABNORMAL HIGH (ref 1.7–2.4)

## 2024-12-03 MED ORDER — EPINEPHRINE HCL 5 MG/250ML IV SOLN IN NS
0.5000 ug/min | INTRAVENOUS | Status: DC
  Administered 2024-12-03: 0.5 ug/min via INTRAVENOUS
  Filled 2024-12-03 (×2): qty 250

## 2024-12-03 MED ORDER — SODIUM BICARBONATE 8.4 % IV SOLN: 100.0000 meq | Freq: Once | INTRAVENOUS | Status: AC

## 2024-12-03 MED ORDER — POLYETHYLENE GLYCOL 3350 17 G PO PACK: 17.0000 g | PACK | Freq: Every day | ORAL | Status: DC

## 2024-12-03 MED ORDER — DEXTROSE 50 % IV SOLN
1.0000 | Freq: Once | INTRAVENOUS | Status: AC
  Administered 2024-12-03: 50 mL via INTRAVENOUS
  Filled 2024-12-03: qty 50

## 2024-12-03 MED ORDER — SODIUM ZIRCONIUM CYCLOSILICATE 10 G PO PACK
10.0000 g | PACK | Freq: Once | ORAL | Status: DC
  Filled 2024-12-03: qty 1

## 2024-12-03 MED ORDER — SODIUM ZIRCONIUM CYCLOSILICATE 10 G PO PACK
10.0000 g | PACK | Freq: Once | ORAL | Status: AC
  Administered 2024-12-03: 10 g
  Filled 2024-12-03: qty 1

## 2024-12-03 MED ORDER — CALCIUM GLUCONATE-NACL 1-0.675 GM/50ML-% IV SOLN
1.0000 g | Freq: Once | INTRAVENOUS | Status: AC
  Administered 2024-12-03: 1000 mg via INTRAVENOUS
  Filled 2024-12-03: qty 50

## 2024-12-03 MED ORDER — VASOPRESSIN 20 UNITS/100 ML INFUSION FOR SHOCK
0.0400 [IU]/min | INTRAVENOUS | Status: DC
  Administered 2024-12-03 – 2024-12-05 (×8): 0.04 [IU]/min via INTRAVENOUS
  Filled 2024-12-03 (×8): qty 100

## 2024-12-03 MED ORDER — SODIUM BICARBONATE 8.4 % IV SOLN
100.0000 meq | Freq: Once | INTRAVENOUS | Status: AC
  Administered 2024-12-03: 100 meq via INTRAVENOUS

## 2024-12-03 MED ORDER — SODIUM BICARBONATE 8.4 % IV SOLN
50.0000 meq | Freq: Once | INTRAVENOUS | Status: AC
  Administered 2024-12-03: 50 meq via INTRAVENOUS
  Filled 2024-12-03: qty 50

## 2024-12-03 MED ORDER — INSULIN ASPART 100 UNIT/ML IV SOLN
10.0000 [IU] | Freq: Once | INTRAVENOUS | Status: AC
  Administered 2024-12-03: 10 [IU] via INTRAVENOUS
  Filled 2024-12-03: qty 10

## 2024-12-03 MED ORDER — POLYETHYLENE GLYCOL 3350 17 G PO PACK
17.0000 g | PACK | Freq: Once | ORAL | Status: AC
  Administered 2024-12-03: 17 g

## 2024-12-03 MED ORDER — SODIUM ZIRCONIUM CYCLOSILICATE 5 G PO PACK
5.0000 g | PACK | Freq: Once | ORAL | Status: AC
  Administered 2024-12-03: 5 g
  Filled 2024-12-03: qty 1

## 2024-12-03 MED ORDER — LACTATED RINGERS IV BOLUS
500.0000 mL | Freq: Once | INTRAVENOUS | Status: AC
  Administered 2024-12-03: 500 mL via INTRAVENOUS

## 2024-12-03 NOTE — Progress Notes (Signed)
 PHARMACY - ANTICOAGULATION CONSULT NOTE  Pharmacy Consult:  Heparin  Indication: acute femoral DVT and concern for PE   Allergies  Allergen Reactions   Dilaudid  [Hydromorphone  Hcl] Nausea Only and Other (See Comments)    Bradycardia  Hyperthermia    Tape Itching and Dermatitis    Patient Measurements: Height: 5' 9 (175.3 cm) Weight: 95.7 kg (210 lb 15.7 oz) IBW/kg (Calculated) : 70.7 HEPARIN  DW (KG): 83  Vital Signs: Temp: 98.4 F (36.9 C) (12/21 1145) Temp Source: Axillary (12/21 1145) BP: 143/93 (12/21 1130) Pulse Rate: 78 (12/21 1130)  Labs: Recent Labs    12/01/24 0500 12/01/24 0828 12/01/24 1109 12/01/24 2245 12/02/24 0454 12/02/24 2357 12/03/24 0312 12/03/24 0814  HGB 13.1   < > 11.6*  --  13.0 13.1  --   --   HCT 40.5   < > 34.0*  --  41.8 42.0  --   --   PLT 375  --   --   --  422* 369  --   --   HEPARINUNFRC  --   --   --    < > 0.40 0.43  --  0.17*  CREATININE  --   --   --   --  0.74 0.79 0.91  --    < > = values in this interval not displayed.    Estimated Creatinine Clearance: 105.9 mL/min (by C-G formula based on SCr of 0.91 mg/dL).  Assessment: 52 YOM presented with AWS, PNA and pleural effusion.  Bronch on 12/8 concern for recurrent cancer.  Doppler positive for femoral DVT and Pharmacy consulted to dose IV heparin  as MRI is negative for mets.  Heparin  stopped 12/18 due to bloody secretions, HgB stable at 13.1 and PLTs 375 on morning CBC. Heparin  previously therapeutic on 1800u/hr. Spoke with CCM, ok to resume. No bloody secretions noted today.   12/21 early AM red blood and clots in ETT - heparin  held until AM labs resulted then resumed at lower rate of 1500 units/hr.  Heparin  level subtherapeutic at 0.17 on 1500 units/hr. Spoke with RN who reports suctioning one clot but less bleeding than described earlier. GOC discussion ongoing.  Goal of Therapy:  Heparin  level 0.3-0.5 units/ml Monitor platelets by anticoagulation protocol: Yes   Plan:   Increase heparin  drip to 1650 units/hr 6h heparin  level and CBC Daily heparin  level, CBC Monitor closely for bleeding worsening/resolution  Thank you for involving pharmacy in this patient's care.  Delon Sax, PharmD, BCPS Clinical Pharmacist Clinical phone for 12/03/2024 is [phone] 12/03/2024 12:14 PM
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
## 2024-12-03 NOTE — Progress Notes (Signed)
 RT performed recruitment maneuver on ventilator with a peep of 16+ 100%. Pt tolerated well with no changes hemodynamically after maneuver

## 2024-12-03 NOTE — Progress Notes (Addendum)
" °   12/03/24 0122 12/03/24 0125 12/03/24 0128  Vitals  BP  --   --   --   MAP (mmHg)  --   --   --   Pulse Rate (!) 105 (!) (S)  102 (!) 122  ECG Heart Rate (!) 107 (!) (S)  103 (!) 118  Resp (!) 28 (!) (S)  28 (!) 28  Oxygen  Therapy  SpO2 90 % (!) (S)  86 % (!) 77 %  Art Line  Arterial Line BP 121/70 (S)  114/65 91/52  Arterial Line MAP (mmHg) 84 mmHg (S)  78 mmHg 62 mmHg    12/03/24 0130  Vitals  BP (!) 82/64  MAP (mmHg) 72  Pulse Rate (!) 115  ECG Heart Rate (!) 109  Resp (!) 28  Oxygen  Therapy  SpO2 (!) 75 %  Art Line  Arterial Line BP 81/55  Arterial Line MAP (mmHg) 63 mmHg     0125: Patient suddenly became hypoxic, O2 sats dropping to 86%, patient in-line suctioned with no return of secretions. Respiratory called.   0130: O2 sats continued to drop to 75% and patient became hypotensive. Elink/CCM MD contacted. New orders for 500cc LR bolus, vasopressin  gtt, chest xray, and ABG. Patient's heart rate dropped into the low 40s, code called and cart brought to room, patient never lost a pulse. O2 sats and BP improved with bagging, fluids and additional pressor.   Patient noted to be in a heart rhythm with bigeminy or sequential PACs with an occasional PVC. EKG obtained. CCM MD at bedside. Labs reviewed. Potassium 5.9, calcium  gluconate IVPB given. O2 sats ranging from 95-88%.   0400: blood clot obstructing ventilation/volumes patient suctioned and lavaged. Blood clot retrieved. Vent in-line suction/tubing/HME filter changed. O2 sats 100%  HR briefly tached up to 170s, and then came back down into irregular rhythm.  0600: Patient started going into an irregular rhythm again with back-to back beats/PACs or bigeminy. CCM notified. See new orders for additional meds to lower potassium. Blood pressure dropped again during this time to 80s systolic. Levo maxed, propofol  and fentanyl  paused. Zoll pads applied preemptively. Epi drip initiated. Patient trendelenburg and tube feeds  stopped.    "

## 2024-12-03 NOTE — Progress Notes (Signed)
 PHARMACY - ANTICOAGULATION CONSULT NOTE  Pharmacy Consult for heparin  Indication: DVT and concern for PE  Labs: Recent Labs    11/30/24 0734 11/30/24 0839 12/01/24 0500 12/01/24 0828 12/01/24 1109 12/01/24 2245 12/02/24 0454 12/02/24 2357  HGB  --    < > 13.1   < > 11.6*  --  13.0 13.1  HCT  --    < > 40.5   < > 34.0*  --  41.8 42.0  PLT  --   --  375  --   --   --  422* 369  HEPARINUNFRC  --    < >  --   --   --  0.38 0.40 0.43  CREATININE 0.71  --   --   --   --   --  0.74 0.79   < > = values in this interval not displayed.   Assessment: Notified by RN that pt had red blood and ?clots in ETT; heparin  had been therapeutic since resuming for mobile femoral DVT and new concern for PE but pt has low goal given previous bloody secretions; had RN hold heparin  infusion until am labs resulted; CBC stable and heparin  level still within goal but at upper end of low goal.  Goal of Therapy:  Heparin  level 0.3-0.5 units/ml   Plan:  Resume heparin  at lower rate of 1500 units/hr. Check level in 6 hours.   Marvetta Dauphin, PharmD, BCPS 12/03/2024 2:48 AM

## 2024-12-03 NOTE — Progress Notes (Signed)
 ABG @ 0630 PH 7.13 PCO2 57.3 PO2 40 BE -10 HCO3 19.4 TC02 21 S02 58

## 2024-12-03 NOTE — Progress Notes (Signed)
 PHARMACY - ANTICOAGULATION CONSULT NOTE  Pharmacy Consult:  Heparin  Indication: acute femoral DVT and concern for PE   Allergies  Allergen Reactions   Dilaudid  [Hydromorphone  Hcl] Nausea Only and Other (See Comments)    Bradycardia  Hyperthermia    Tape Itching and Dermatitis    Patient Measurements: Height: 5' 9 (175.3 cm) Weight: 95.7 kg (210 lb 15.7 oz) IBW/kg (Calculated) : 70.7 HEPARIN  DW (KG): 83  Vital Signs: Temp: 99.1 F (37.3 C) (12/21 1600) Temp Source: Axillary (12/21 1600) BP: 147/91 (12/21 1830) Pulse Rate: 75 (12/21 1830)  Labs: Recent Labs    12/02/24 0454 12/02/24 2357 12/03/24 0312 12/03/24 0814 12/03/24 1800  HGB 13.0 13.1  --   --  12.4*  HCT 41.8 42.0  --   --  39.8  PLT 422* 369  --   --  389  HEPARINUNFRC 0.40 0.43  --  0.17* 0.27*  CREATININE 0.74 0.79 0.91  --   --     Estimated Creatinine Clearance: 105.9 mL/min (by C-G formula based on SCr of 0.91 mg/dL).  Assessment: 26 YOM presented with AWS, PNA and pleural effusion.  Bronch on 12/8 concern for recurrent cancer.  Doppler positive for femoral DVT and Pharmacy consulted to dose IV heparin  as MRI is negative for mets.  Heparin  stopped 12/18 due to bloody secretions, HgB stable at 13.1 and PLTs 375 on morning CBC. Heparin  previously therapeutic on 1800u/hr. Spoke with CCM, ok to resume. No bloody secretions noted today.   12/21 early AM red blood and clots in ETT - heparin  held until AM labs resulted then resumed at lower rate of 1500 units/hr.  Heparin  level subtherapeutic at 0.17 on 1500 units/hr. Spoke with RN who reports suctioning one clot but less bleeding than described earlier. GOC discussion ongoing.  PM: heparin  level 0.27 is subtherapeutic on 1650 units/hr. Per RN, sputum remains pink-tinged, but no blood clots suctioned since this morning.   Goal of Therapy:  Heparin  level 0.3-0.5 units/ml Monitor platelets by anticoagulation protocol: Yes   Plan:  Increase heparin  drip  to 1700 units/hr 6h heparin  level Daily heparin  level, CBC Monitor closely for bleeding worsening/resolution  Thank you for involving pharmacy in this patient's care.  Rocky Slade, PharmD, BCPS Clinical Pharmacist 12/03/2024 6:47 PM  Please check AMION for all [facility] Pharmacy phone numbers After 10:00 PM, call Main Pharmacy [phone]

## 2024-12-03 NOTE — Plan of Care (Signed)
" °  Problem: Clinical Measurements: Goal: Ability to maintain clinical measurements within normal limits will improve Outcome: Not Progressing Goal: Respiratory complications will improve Outcome: Not Progressing Goal: Cardiovascular complication will be avoided Outcome: Not Progressing   Problem: Elimination: Goal: Will not experience complications related to bowel motility Outcome: Not Progressing   Problem: Respiratory: Goal: Ability to maintain a clear airway and adequate ventilation will improve Outcome: Not Progressing   "

## 2024-12-03 NOTE — Progress Notes (Signed)
 CCM notified Potassium still at a 6. See new orders.

## 2024-12-03 NOTE — Progress Notes (Addendum)
 1015- Notified CCM Dr. Theophilus that Pts HR irregular running from 60s-80s. He decreased Pts Amiodarone  drip, see new orders.   1200- Notified Dr. Theophilus Pt sustaining Bps in the 140s systolically and Map >95. Dr. Theophilus stated to stop Epi infusion first of all the pressors, titrating down to turn off. Also made aware given PRNs and bowel regimen and still hasn't had a BM.

## 2024-12-03 NOTE — Progress Notes (Addendum)
 1400hrs - Notified Dr. Theophilus that Pt was desaturating into the 80s and 70s. Respiratory notified and at bedside and adjusted ventilator settings per MD order. MD told this RN to restart nimbex . RN restarted along with fentanyl . RN asked if we should prone Pt, CCM stated no since Pt us  unstable.   1430- Notified Dr. Theophilus pt has not made any progress. RT collected an iStat. Results along with K level relayed to Dr. Theophilus. ACLs medications ordered. RN asked if we should give a enema or add any other medications to make Pt have a BM to bring levels down. Now new orders for BM regimen at this time.   1445- Dr. Theophilus at bedside, and stated no further interventions at this time.   1500- RN and MD attempted to call wife and sister listed in chart, total of 4 calls attempted to give emergent update.

## 2024-12-03 NOTE — Progress Notes (Signed)
 Vent alarmed about pressure/resistance. Patient inline suctioned, and bloody secretions with what appeared to be clots were brought up.   Patient previously had bloody secretions in days prior but they had since subsided. Patient was previously getting TXA nebs. Patient is currently on a heparin  drip.   CCM notified and pharmacy contacted. Heparin  drip stopped at 2352 per pharmacist. Morning labs drawn and sent early.
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
## 2024-12-03 NOTE — IPAL (Signed)
" °  Interdisciplinary Goals of Care Richard Meeting   Date carried out:: 12/03/2024  Location of the meeting: Bedside  Member's involved: Physician and Richard Member or next of kin  Durable Power of Attorney or environmental health practitioner: Wife    Discussion: We discussed goals of care for Richard [name] .  We reviewed his deteriorating clinical status.  Now maxed out on all interventions.  Prognosis is extremely poor.  She agreed to no CPR or defibrillation's in case of cardiac arrest.  She wants to continue all other measures for now and will discuss with rest of the Richard about comfort measures.  Code status: Limited Code or DNR with short term, Mechanical ventilation, Cental IV access, IV vasoactive drugs, and IV antiarrhythmic  Disposition: Continue current acute care   Time spent for the meeting: 15 mins  Richard [name] 12/03/2024, 9:09 AM  "

## 2024-12-04 LAB — POCT I-STAT 7, (LYTES, BLD GAS, ICA,H+H)
Acid-base deficit: 2 mmol/L (ref 0.0–2.0)
Acid-base deficit: 4 mmol/L — ABNORMAL HIGH (ref 0.0–2.0)
Acid-base deficit: 10 mmol/L — ABNORMAL HIGH (ref 0.0–2.0)
Acid-base deficit: 1 mmol/L (ref 0.0–2.0)
Bicarbonate: 26.3 mmol/L (ref 20.0–28.0)
Bicarbonate: 26.4 mmol/L (ref 20.0–28.0)
Bicarbonate: 19.4 mmol/L — ABNORMAL LOW (ref 20.0–28.0)
Bicarbonate: 28.1 mmol/L — ABNORMAL HIGH (ref 20.0–28.0)
Calcium, Ion: 1.13 mmol/L — ABNORMAL LOW (ref 1.15–1.40)
Calcium, Ion: 1.14 mmol/L — ABNORMAL LOW (ref 1.15–1.40)
Calcium, Ion: 1 mmol/L — ABNORMAL LOW (ref 1.15–1.40)
Calcium, Ion: 1.12 mmol/L — ABNORMAL LOW (ref 1.15–1.40)
HCT: 42 % (ref 39.0–52.0)
HCT: 43 % (ref 39.0–52.0)
HCT: 39 % (ref 39.0–52.0)
HCT: 42 % (ref 39.0–52.0)
Hemoglobin: 14.3 g/dL (ref 13.0–17.0)
Hemoglobin: 14.6 g/dL (ref 13.0–17.0)
Hemoglobin: 13.3 g/dL (ref 13.0–17.0)
Hemoglobin: 14.3 g/dL (ref 13.0–17.0)
O2 Saturation: 56 %
O2 Saturation: 57 %
O2 Saturation: 58 %
O2 Saturation: 75 %
Potassium: 6 mmol/L — ABNORMAL HIGH (ref 3.5–5.1)
Potassium: 6.6 mmol/L (ref 3.5–5.1)
Potassium: 6.3 mmol/L (ref 3.5–5.1)
Potassium: 5.9 mmol/L — ABNORMAL HIGH (ref 3.5–5.1)
Sodium: 128 mmol/L — ABNORMAL LOW (ref 135–145)
Sodium: 131 mmol/L — ABNORMAL LOW (ref 135–145)
Sodium: 129 mmol/L — ABNORMAL LOW (ref 135–145)
Sodium: 129 mmol/L — ABNORMAL LOW (ref 135–145)
TCO2: 28 mmol/L (ref 22–32)
TCO2: 29 mmol/L (ref 22–32)
TCO2: 21 mmol/L — ABNORMAL LOW (ref 22–32)
TCO2: 30 mmol/L (ref 22–32)
pCO2 arterial: 61.9 mmHg — ABNORMAL HIGH (ref 32–48)
pCO2 arterial: 71 mmHg (ref 32–48)
pCO2 arterial: 57.3 mmHg — ABNORMAL HIGH (ref 32–48)
pCO2 arterial: 66.1 mmHg (ref 32–48)
pH, Arterial: 7.178 — CL (ref 7.35–7.45)
pH, Arterial: 7.237 — ABNORMAL LOW (ref 7.35–7.45)
pH, Arterial: 7.137 — CL (ref 7.35–7.45)
pH, Arterial: 7.237 — ABNORMAL LOW (ref 7.35–7.45)
pO2, Arterial: 35 mmHg — CL (ref 83–108)
pO2, Arterial: 38 mmHg — CL (ref 83–108)
pO2, Arterial: 40 mmHg — CL (ref 83–108)
pO2, Arterial: 48 mmHg — ABNORMAL LOW (ref 83–108)

## 2024-12-04 LAB — CBC
HCT: 37.5 % — ABNORMAL LOW (ref 39.0–52.0)
Hemoglobin: 11.8 g/dL — ABNORMAL LOW (ref 13.0–17.0)
MCH: 33.1 pg (ref 26.0–34.0)
MCHC: 31.5 g/dL (ref 30.0–36.0)
MCV: 105 fL — ABNORMAL HIGH (ref 80.0–100.0)
Platelets: 325 K/uL (ref 150–400)
RBC: 3.57 MIL/uL — ABNORMAL LOW (ref 4.22–5.81)
RDW: 15 % (ref 11.5–15.5)
WBC: 20.5 K/uL — ABNORMAL HIGH (ref 4.0–10.5)
nRBC: 0.7 % — ABNORMAL HIGH (ref 0.0–0.2)

## 2024-12-04 LAB — BASIC METABOLIC PANEL WITH GFR
Anion gap: 10 (ref 5–15)
BUN: 79 mg/dL — ABNORMAL HIGH (ref 6–20)
CO2: 28 mmol/L (ref 22–32)
Calcium: 8.6 mg/dL — ABNORMAL LOW (ref 8.9–10.3)
Chloride: 91 mmol/L — ABNORMAL LOW (ref 98–111)
Creatinine, Ser: 1.06 mg/dL (ref 0.61–1.24)
GFR, Estimated: >60 mL/min
Glucose, Bld: 140 mg/dL — ABNORMAL HIGH (ref 70–99)
Potassium: 5.8 mmol/L — ABNORMAL HIGH (ref 3.5–5.1)
Sodium: 129 mmol/L — ABNORMAL LOW (ref 135–145)

## 2024-12-04 LAB — POTASSIUM
Potassium: 6.2 mmol/L — ABNORMAL HIGH (ref 3.5–5.1)
Potassium: 5.8 mmol/L — ABNORMAL HIGH (ref 3.5–5.1)
Potassium: 5.7 mmol/L — ABNORMAL HIGH (ref 3.5–5.1)
Potassium: 6 mmol/L — ABNORMAL HIGH (ref 3.5–5.1)

## 2024-12-04 LAB — GLUCOSE, CAPILLARY
Glucose-Capillary: 138 mg/dL — ABNORMAL HIGH (ref 70–99)
Glucose-Capillary: 140 mg/dL — ABNORMAL HIGH (ref 70–99)
Glucose-Capillary: 147 mg/dL — ABNORMAL HIGH (ref 70–99)
Glucose-Capillary: 154 mg/dL — ABNORMAL HIGH (ref 70–99)
Glucose-Capillary: 155 mg/dL — ABNORMAL HIGH (ref 70–99)
Glucose-Capillary: 174 mg/dL — ABNORMAL HIGH (ref 70–99)

## 2024-12-04 LAB — HEPARIN LEVEL (UNFRACTIONATED)
Heparin Unfractionated: 0.37 [IU]/mL (ref 0.30–0.70)
Heparin Unfractionated: 0.32 [IU]/mL (ref 0.30–0.70)

## 2024-12-04 MED ORDER — SODIUM BICARBONATE 8.4 % IV SOLN
50.0000 meq | Freq: Once | INTRAVENOUS | Status: AC
  Administered 2024-12-04: 50 meq via INTRAVENOUS
  Filled 2024-12-04: qty 50

## 2024-12-04 MED ORDER — CALCIUM GLUCONATE-NACL 1-0.675 GM/50ML-% IV SOLN
1.0000 g | Freq: Once | INTRAVENOUS | Status: AC
  Administered 2024-12-04: 1000 mg via INTRAVENOUS
  Filled 2024-12-04: qty 50

## 2024-12-04 MED ORDER — SENNOSIDES-DOCUSATE SODIUM 8.6-50 MG PO TABS: 1.0000 | ORAL_TABLET | Freq: Every day | ORAL | Status: DC

## 2024-12-04 MED ORDER — DEXTROSE 50 % IV SOLN
1.0000 | Freq: Once | INTRAVENOUS | Status: AC
  Administered 2024-12-04: 50 mL via INTRAVENOUS
  Filled 2024-12-04: qty 50

## 2024-12-04 MED ORDER — SODIUM ZIRCONIUM CYCLOSILICATE 10 G PO PACK
10.0000 g | PACK | Freq: Once | ORAL | Status: AC
  Administered 2024-12-04: 10 g

## 2024-12-04 MED ORDER — INSULIN ASPART 100 UNIT/ML IV SOLN
10.0000 [IU] | Freq: Once | INTRAVENOUS | Status: AC
  Administered 2024-12-04: 10 [IU] via INTRAVENOUS
  Filled 2024-12-04: qty 10

## 2024-12-04 MED FILL — Fentanyl Citrate-NaCl 0.9% IV Soln 2.5 MG/250ML: INTRAVENOUS | Qty: 250 | Status: AC

## 2024-12-04 NOTE — Progress Notes (Signed)
 PHARMACY - ANTICOAGULATION CONSULT NOTE  Pharmacy Consult for heparin  Indication: DVT and concern for PE  Labs: Recent Labs    12/02/24 0454 12/02/24 2357 12/03/24 0312 12/03/24 0814 12/03/24 1800 12/04/24 0059  HGB 13.0 13.1  --   --  12.4*  --   HCT 41.8 42.0  --   --  39.8  --   PLT 422* 369  --   --  389  --   HEPARINUNFRC 0.40 0.43  --  0.17* 0.27* 0.37  CREATININE 0.74 0.79 0.91  --   --   --    Assessment/Plan:  53yo male now therapeutic on heparin  after rate change; no further blood or clots from ETT though pt is back on paralytic. Will continue heparin  infusion at current rate of 1700 units/hr and confirm stable with additional level.  Marvetta Dauphin, PharmD, BCPS 12/04/2024 2:15 AM

## 2024-12-04 NOTE — Progress Notes (Signed)
 PHARMACY - ANTICOAGULATION CONSULT NOTE  Pharmacy Consult:  Heparin  Indication: acute femoral DVT and concern for PE   Allergies  Allergen Reactions   Dilaudid  [Hydromorphone  Hcl] Nausea Only and Other (See Comments)    Bradycardia  Hyperthermia    Tape Itching and Dermatitis    Patient Measurements: Height: 5' 9 (175.3 cm) Weight: 95.7 kg (210 lb 15.7 oz) IBW/kg (Calculated) : 70.7 HEPARIN  DW (KG): 83  Vital Signs: Temp: 100.6 F (38.1 C) (12/22 0800) Temp Source: Axillary (12/22 0800) BP: 134/81 (12/22 0400) Pulse Rate: 99 (12/22 0743)  Labs: Recent Labs    12/02/24 2357 12/03/24 0312 12/03/24 0814 12/03/24 1800 12/04/24 0059 12/04/24 0511 12/04/24 0907  HGB 13.1  --   --  12.4*  --  11.8*  --   HCT 42.0  --   --  39.8  --  37.5*  --   PLT 369  --   --  389  --  325  --   HEPARINUNFRC 0.43  --    < > 0.27* 0.37  --  0.32  CREATININE 0.79 0.91  --   --   --  1.06  --    < > = values in this interval not displayed.    Estimated Creatinine Clearance: 90.9 mL/min (by C-G formula based on SCr of 1.06 mg/dL).  Assessment: 26 YOM presented with AWS, PNA and pleural effusion.  Bronch on 12/8 concern for recurrent cancer.  MRI is negative for mets. Doppler positive for femoral DVT. Given how mobile clot and increased oxygen  requirements concern for PE. Pharmacy consulted to dose IV heparin  for DVT/PE.   Heparin  level 0.32, therapeutic Heparin  held multiple times this admission d/t bloody secretions. Now on TXA nebs q8h with improvement in bloody secretion this AM. Hgb stable. No issues with infusion.  Goal of Therapy:  Heparin  level 0.3-0.5 units/ml Monitor platelets by anticoagulation protocol: Yes   Plan:  Continue heparin  drip to 1700 units/hr Daily heparin  level, CBC, and bleeding assessment F/u GOC and longterm AC plan  Thank you for allowing pharmacy to participate in this patient's care.  Leonor GORMAN Bash, PharmD Emergency Medicine Clinical  Pharmacist 12/04/2024,9:51 AM
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
## 2024-12-04 NOTE — Progress Notes (Signed)
 eLink Physician-Brief Progress Note Patient Name: Richard [name] DOB: [birth year] MRN: [id number]   Date of Service  12/04/2024  HPI/Events of Note  K6.2  eICU Interventions  Hyperkalemia protocol     Intervention Category Intermediate Interventions: Electrolyte abnormality - evaluation and management  [name] 12/04/2024, 3:23 AM

## 2024-12-05 ENCOUNTER — Inpatient Hospital Stay (HOSPITAL_COMMUNITY)

## 2024-12-05 DIAGNOSIS — R578 Other shock: Secondary | ICD-10-CM

## 2024-12-05 DIAGNOSIS — R579 Shock, unspecified: Secondary | ICD-10-CM

## 2024-12-05 DIAGNOSIS — E871 Hypo-osmolality and hyponatremia: Secondary | ICD-10-CM

## 2024-12-05 DIAGNOSIS — B9561 Methicillin susceptible Staphylococcus aureus infection as the cause of diseases classified elsewhere: Secondary | ICD-10-CM

## 2024-12-05 DIAGNOSIS — I4891 Unspecified atrial fibrillation: Secondary | ICD-10-CM

## 2024-12-05 DIAGNOSIS — E875 Hyperkalemia: Secondary | ICD-10-CM

## 2024-12-05 LAB — HEPARIN LEVEL (UNFRACTIONATED): Heparin Unfractionated: 0.32 [IU]/mL (ref 0.30–0.70)

## 2024-12-05 LAB — POTASSIUM
Potassium: 5.7 mmol/L — ABNORMAL HIGH (ref 3.5–5.1)
Potassium: 5.5 mmol/L — ABNORMAL HIGH (ref 3.5–5.1)
Potassium: 5.8 mmol/L — ABNORMAL HIGH (ref 3.5–5.1)

## 2024-12-05 LAB — POCT I-STAT 7, (LYTES, BLD GAS, ICA,H+H)
Acid-Base Excess: 0 mmol/L (ref 0.0–2.0)
Bicarbonate: 26.7 mmol/L (ref 20.0–28.0)
Calcium, Ion: 1.09 mmol/L — ABNORMAL LOW (ref 1.15–1.40)
HCT: 41 % (ref 39.0–52.0)
Hemoglobin: 13.9 g/dL (ref 13.0–17.0)
O2 Saturation: 84 %
Patient temperature: 99.3
Potassium: 6.4 mmol/L (ref 3.5–5.1)
Sodium: 134 mmol/L — ABNORMAL LOW (ref 135–145)
TCO2: 28 mmol/L (ref 22–32)
pCO2 arterial: 53.4 mmHg — ABNORMAL HIGH (ref 32–48)
pH, Arterial: 7.309 — ABNORMAL LOW (ref 7.35–7.45)
pO2, Arterial: 55 mmHg — ABNORMAL LOW (ref 83–108)

## 2024-12-05 LAB — GLUCOSE, CAPILLARY
Glucose-Capillary: 136 mg/dL — ABNORMAL HIGH (ref 70–99)
Glucose-Capillary: 132 mg/dL — ABNORMAL HIGH (ref 70–99)
Glucose-Capillary: 143 mg/dL — ABNORMAL HIGH (ref 70–99)
Glucose-Capillary: 187 mg/dL — ABNORMAL HIGH (ref 70–99)

## 2024-12-05 LAB — BASIC METABOLIC PANEL WITH GFR
Anion gap: 15 (ref 5–15)
BUN: 82 mg/dL — ABNORMAL HIGH (ref 6–20)
CO2: 24 mmol/L (ref 22–32)
Calcium: 8.6 mg/dL — ABNORMAL LOW (ref 8.9–10.3)
Chloride: 90 mmol/L — ABNORMAL LOW (ref 98–111)
Creatinine, Ser: 0.84 mg/dL (ref 0.61–1.24)
GFR, Estimated: >60 mL/min
Glucose, Bld: 164 mg/dL — ABNORMAL HIGH (ref 70–99)
Potassium: 5.5 mmol/L — ABNORMAL HIGH (ref 3.5–5.1)
Sodium: 129 mmol/L — ABNORMAL LOW (ref 135–145)

## 2024-12-05 LAB — CBC
HCT: 36.7 % — ABNORMAL LOW (ref 39.0–52.0)
Hemoglobin: 11.4 g/dL — ABNORMAL LOW (ref 13.0–17.0)
MCH: 32.6 pg (ref 26.0–34.0)
MCHC: 31.1 g/dL (ref 30.0–36.0)
MCV: 104.9 fL — ABNORMAL HIGH (ref 80.0–100.0)
Platelets: 332 K/uL (ref 150–400)
RBC: 3.5 MIL/uL — ABNORMAL LOW (ref 4.22–5.81)
RDW: 14.9 % (ref 11.5–15.5)
WBC: 20.4 K/uL — ABNORMAL HIGH (ref 4.0–10.5)
nRBC: 2 % — ABNORMAL HIGH (ref 0.0–0.2)

## 2024-12-05 LAB — COOXEMETRY PANEL
Carboxyhemoglobin: 1.9 % — ABNORMAL HIGH (ref 0.5–1.5)
Methemoglobin: <0.7 % (ref 0.0–1.5)
O2 Saturation: 94.2 %
Total hemoglobin: 11.3 g/dL — ABNORMAL LOW (ref 12.0–16.0)

## 2024-12-05 LAB — TRIGLYCERIDES: Triglycerides: 185 mg/dL — ABNORMAL HIGH

## 2024-12-05 MED ORDER — PIPERACILLIN-TAZOBACTAM 3.375 G IVPB
3.3750 g | Freq: Three times a day (TID) | INTRAVENOUS | Status: DC
  Administered 2024-12-05: 3.375 g via INTRAVENOUS
  Filled 2024-12-05: qty 50

## 2024-12-05 MED ORDER — AMIODARONE IV BOLUS ONLY 150 MG/100ML: 150.0000 mg | Freq: Once | INTRAVENOUS | Status: DC

## 2024-12-05 MED ORDER — SODIUM ZIRCONIUM CYCLOSILICATE 5 G PO PACK
5.0000 g | PACK | Freq: Once | ORAL | Status: AC
  Administered 2024-12-05: 5 g
  Filled 2024-12-05: qty 1

## 2024-12-05 MED ORDER — AMIODARONE LOAD VIA INFUSION
150.0000 mg | Freq: Once | INTRAVENOUS | Status: AC
  Administered 2024-12-05: 150 mg via INTRAVENOUS
  Filled 2024-12-05: qty 83.34

## 2024-12-05 MED ORDER — OSMOLITE 1.5 CAL PO LIQD
1000.0000 mL | ORAL | Status: DC
  Administered 2024-12-05: 1000 mL

## 2024-12-06 LAB — ECHOCARDIOGRAM LIMITED
Height: 69 in
S' Lateral: 2.4 cm
Weight: 3527.36 [oz_av]

## 2024-12-12 MED FILL — Medication: Qty: 1 | Status: AC
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
## 2024-12-14 NOTE — Progress Notes (Signed)
 Nutrition Follow-up  DOCUMENTATION CODES:   Non-severe (moderate) malnutrition in context of chronic illness (cancer)  INTERVENTION:  Continue tube feeding via cortrak tube: Decrease Osmolite 1.5 to 60 ml/h (1440 ml per day) Prosource TF20 60 ml BID Provides 2320 kcal, 130 gm protein, 1097 ml free water  daily   Propofol  providing additional kcal when infusing   Continue MVI with minerals    FWF per MD  NUTRITION DIAGNOSIS:   Moderate Malnutrition related to cancer and cancer related treatments as evidenced by mild fat depletion, moderate fat depletion, moderate muscle depletion. - Ongoing   GOAL:   Patient will meet greater than or equal to 90% of their needs - Meeting via TFs  MONITOR:   Diet advancement, Vent status, TF tolerance, Labs  REASON FOR ASSESSMENT:   Consult Enteral/tube feeding initiation and management  ASSESSMENT:   Pt with hx of CVA, HTN. DVT, CHF, CAD, chronic respiratory failure (on 4-5L ), COPD, NSCLC stage 3b (s/p palliative radiation d/t noncompliance w/ chemo and immunotherapy), and polysubstance abuse. Admitted for encephalopathy and acute on chronic respiratory failure in the setting of alcohol intoxication.  12/3 - Admitted to ICU 12/4 - intubated; s/p bronch, R thoracentesis 12/5 - s/p cortrak placement; tip gastric  12/21 - Worsening clinical statis, maxed out on pressors and epinephrine     Patient is currently intubated on ventilator support.  Pt discussed with RN. Pt with worsening clinical status 12/21 which required increase in pressors. Still remains on high pressor requirements however pt is hemodynamically stable. Map goal of >65. Pt still remains on fiO2 100% and PEEP 14. Per RN to unstable to prone. Still on paralytic. Having BM. Tolerating tube feeds at goal.   Palliative care and Ethics following for GOC  Will decrease rate of tube feeds as pt has been on prolonged high dose propofol  adding extra calories.   MV: 16.1  L/min Temp (24hrs), Avg:99.5 F (37.5 C), Min:98.5 F (36.9 C), Max:100.6 F (38.1 C) MAP (a line): 97 mmHg  Propofol : 33.8 ml/hr providing 892 kcal in 24 hour period   Admit weight: 88 kg  Current weight: 100 kg +3 Edema perineal + 1 Generalized edema    Intake/Output Summary (Last 24 hours) at 12-23-24 1251 Last data filed at 12-23-2024 1100 Gross per 24 hour  Intake 5042.81 ml  Output 2045 ml  Net 2997.81 ml   Net IO Since Admission: 21,068.22 mL [12-23-2024 1251]  Nutritionally Relevant Medications: Scheduled Meds:  feeding supplement (PROSource TF20)  60 mL Per Tube BID   folic acid   1 mg Per Tube Daily   insulin  aspart  0-9 Units Subcutaneous Q4H   multivitamin with minerals  1 tablet Per Tube Daily   senna-docusate  1 tablet Per Tube Daily   sertraline   25 mg Per Tube Daily   Continuous Infusions:  amiodarone  15 mg/hr (12-23-24 1100)   cisatracurium  (NIMBEX ) 200 mg in sodium chloride  0.9 % 100 mL (2 mg/mL) infusion 4 mcg/kg/min (23-Dec-2024 1100)   epinephrine  4 mcg/min (23-Dec-2024 1100)   feeding supplement (OSMOLITE 1.5 CAL)     fentaNYL  infusion INTRAVENOUS 75 mcg/hr (Dec 23, 2024 1100)   heparin  1,700 Units/hr (2024/12/23 1159)   norepinephrine  (LEVOPHED ) Adult infusion 9 mcg/min (12/23/2024 1100)   piperacillin -tazobactam (ZOSYN )  IV     propofol  (DIPRIVAN ) infusion 65 mcg/kg/min (12-23-24 1208)   vasopressin  0.04 Units/min (12/23/2024 1200)   Labs Reviewed: Sodium 129 Potassium 5.5 BUN 82 CBG ranges from 132-174 mg/dL over the last 24 hours HgbA1c 6.0  Diet Order:   Diet Order             Diet NPO time specified  Diet effective now                   EDUCATION NEEDS:   Not appropriate for education at this time  Skin:  Skin Assessment: Reviewed RN Assessment  Last BM:  12/22, Type 6 x 2  Height:   Ht Readings from Last 1 Encounters:  11/30/24 5' 9 (1.753 m)    Weight:   Wt Readings from Last 1 Encounters:  29-Dec-2024 100 kg    Ideal  Body Weight:  72.7 kg  BMI:  Body mass index is 32.56 kg/m.  Estimated Nutritional Needs:   Kcal:  2300-2500  Protein:  120-140 gm  Fluid:  >2L   Olivia Kenning, RD Registered Dietitian  See Amion for more information

## 2024-12-14 NOTE — Progress Notes (Signed)
 Time of death 49. Pronounced by Nat PEAK and Bronwen Pendergraft RN. Dr. Theophilus notified. Attempted to notify wife and sister, no answer. Voicemail left to return our call.

## 2024-12-14 NOTE — TOC Progression Note (Signed)
 Transition of Care [facility]) - Progression Note    Patient Details  Name: Richard [name] MRN: [id number] Date of Birth: 23-Nov-1971  Transition of Care [facility]) CM/SW Contact  Inocente GORMAN Kindle, LCSW Phone Number: 2024/12/18, 8:50 AM  Clinical Narrative:    ICM continuing to follow.     Barriers to Discharge: Continued Medical Work up               Expected Discharge Plan and Services   Discharge Planning Services: CM Consult                                           Social Drivers of Health (SDOH) Interventions SDOH Screenings   Food Insecurity: No Food Insecurity (09/23/2024)  Housing: Low Risk (09/23/2024)  Transportation Needs: Unmet Transportation Needs (09/23/2024)  Utilities: Not At Risk (09/23/2024)  Financial Resource Strain: Medium Risk (09/17/2023)  Physical Activity: Not on File (08/30/2023)   Received from [facility]  Social Connections: Not on File (08/30/2023)   Received from [facility]  Stress: Not on File (08/30/2023)   [facility]  Tobacco Use: High Risk (11/14/2024)    Readmission Risk Interventions    09/25/2024    9:55 AM  Readmission Risk Prevention Plan  Transportation Screening Complete  Medication Review (RN Care Manager) Complete  PCP or Specialist appointment within 3-5 days of discharge Complete  HRI or Home Care Consult Complete  Palliative Care Screening Not Applicable  Skilled Nursing Facility Not Applicable

## 2024-12-14 NOTE — Progress Notes (Signed)
 PHARMACY - ANTICOAGULATION CONSULT NOTE  Pharmacy Consult:  Heparin  Indication: acute femoral DVT and concern for PE   Allergies  Allergen Reactions   Dilaudid  [Hydromorphone  Hcl] Nausea Only and Other (See Comments)    Bradycardia  Hyperthermia    Tape Itching and Dermatitis    Patient Measurements: Height: 5' 9 (175.3 cm) Weight: 100 kg (220 lb 7.4 oz) IBW/kg (Calculated) : 70.7 HEPARIN  DW (KG): 83  Vital Signs: Temp: 99.2 F (37.3 C) 2024-12-21 0400) Temp Source: Axillary Dec 21, 2024 0400) BP: 97/72 12-21-24 0400) Pulse Rate: 107 12/21/24 0745)  Labs: Recent Labs    12/02/24 2357 12/03/24 0159 12/03/24 0312 12/03/24 9361 12/03/24 1800 12/04/24 0059 12/04/24 0511 12/04/24 0907 2024/12/21 0541  HGB 13.1   < >  --    < > 12.4*  --  11.8*  --  11.4*  HCT 42.0   < >  --    < > 39.8  --  37.5*  --  36.7*  PLT 369  --   --   --  389  --  325  --  332  HEPARINUNFRC 0.43  --   --    < > 0.27* 0.37  --  0.32 0.32  CREATININE 0.79  --  0.91  --   --   --  1.06  --   --    < > = values in this interval not displayed.    Estimated Creatinine Clearance: 92.9 mL/min (by C-G formula based on SCr of 1.06 mg/dL).  Assessment: 2 YOM presented with AWS, PNA and pleural effusion.  Bronch on 12/8 concern for recurrent cancer.  MRI is negative for mets. Doppler positive for femoral DVT. Given how mobile clot and increased oxygen  requirements concern for PE. Pharmacy consulted to dose IV heparin  for DVT/PE.   Heparin  level 0.32, therapeutic Heparin  held multiple times this admission d/t bloody secretions.  Completed TXA nebs q8h x6.  No further bloody secretions appreciated. Hgb stable. No issues with infusion.  Goal of Therapy:  Heparin  level 0.3-0.5 units/ml Monitor platelets by anticoagulation protocol: Yes   Plan:  Continue heparin  drip to 1700 units/hr Daily heparin  level, CBC, and bleeding assessment F/u GOC and longterm AC plan  Thank you for allowing pharmacy to participate in  this patient's care.  Harlene Boga, PharmD Please refer to [facility] for [facility] Pharmacy numbers 12/21/2024,8:18 AM

## 2024-12-14 NOTE — Progress Notes (Addendum)
 Addendum: Time of death 6  I again tried to reach family without success, HIPAA compliant VM left. RN has also tried without success.     ________________________________   Several unsuccessful attempts to reach family today, trying both spouse and sister.  HIPAA compliant VM left this morning and afternoon requesting return call   Worsening persistent hypoxemia Sats now low 70s with a good pleth Cardiac lability both with pressures and heart rate/rhythm   I am concerned that the patient is likely nearing end of life. He is DNR.     Ronnald Gave MSN, AGACNP-BC [facility] 2024-12-07, 4:25 PM
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
## 2024-12-14 DEATH — deceased
